# Patient Record
Sex: Male | Born: 1970
Health system: Southern US, Community
[De-identification: ages and names within clinical notes are randomized; demographics above are authoritative.]

## PROBLEM LIST (undated history)

## (undated) DIAGNOSIS — S91309A Unspecified open wound, unspecified foot, initial encounter: Secondary | ICD-10-CM

## (undated) DIAGNOSIS — E119 Type 2 diabetes mellitus without complications: Secondary | ICD-10-CM

## (undated) DIAGNOSIS — Z973 Presence of spectacles and contact lenses: Secondary | ICD-10-CM

## (undated) DIAGNOSIS — I4891 Unspecified atrial fibrillation: Secondary | ICD-10-CM

## (undated) DIAGNOSIS — E785 Hyperlipidemia, unspecified: Secondary | ICD-10-CM

## (undated) DIAGNOSIS — K219 Gastro-esophageal reflux disease without esophagitis: Secondary | ICD-10-CM

## (undated) DIAGNOSIS — I10 Essential (primary) hypertension: Secondary | ICD-10-CM

## (undated) HISTORY — DX: Hyperlipidemia, unspecified: E78.5

## (undated) HISTORY — DX: Essential (primary) hypertension: I10

## (undated) MED FILL — Medication: Fill #1 | Status: CN

---

## 2004-10-09 ENCOUNTER — Emergency Department (HOSPITAL_COMMUNITY): Admission: EM | Admit: 2004-10-09 | Discharge: 2004-10-09 | Payer: Self-pay | Admitting: Emergency Medicine

## 2006-03-28 ENCOUNTER — Emergency Department (HOSPITAL_COMMUNITY): Admission: EM | Admit: 2006-03-28 | Discharge: 2006-03-29 | Payer: Self-pay | Admitting: Emergency Medicine

## 2006-08-04 ENCOUNTER — Inpatient Hospital Stay (HOSPITAL_COMMUNITY): Admission: EM | Admit: 2006-08-04 | Discharge: 2006-08-07 | Payer: Self-pay | Admitting: Emergency Medicine

## 2012-11-07 ENCOUNTER — Emergency Department (HOSPITAL_COMMUNITY)
Admission: EM | Admit: 2012-11-07 | Discharge: 2012-11-07 | Disposition: A | Discharge status: Home / Self Care | Payer: Self-pay | Attending: Emergency Medicine | Admitting: Emergency Medicine

## 2012-11-07 ENCOUNTER — Encounter (HOSPITAL_COMMUNITY): Payer: Self-pay | Admitting: Nurse Practitioner

## 2012-11-07 DIAGNOSIS — R1032 Left lower quadrant pain: Secondary | ICD-10-CM | POA: Insufficient documentation

## 2012-11-07 DIAGNOSIS — E119 Type 2 diabetes mellitus without complications: Secondary | ICD-10-CM | POA: Insufficient documentation

## 2012-11-07 DIAGNOSIS — R109 Unspecified abdominal pain: Secondary | ICD-10-CM

## 2012-11-07 HISTORY — DX: Type 2 diabetes mellitus without complications: E11.9

## 2012-11-07 LAB — CBC WITH DIFFERENTIAL/PLATELET
Basophils Relative: 0 % (ref 0–1)
Eosinophils Absolute: 0 10*3/uL (ref 0.0–0.7)
Hemoglobin: 14 g/dL (ref 13.0–17.0)
MCH: 30.8 pg (ref 26.0–34.0)
MCHC: 36.3 g/dL — ABNORMAL HIGH (ref 30.0–36.0)
Neutrophils Relative %: 46 % (ref 43–77)
Platelets: 226 10*3/uL (ref 150–400)
RDW: 12.7 % (ref 11.5–15.5)

## 2012-11-07 LAB — COMPREHENSIVE METABOLIC PANEL
ALT: 12 U/L (ref 0–53)
BUN: 9 mg/dL (ref 6–23)
GFR calc non Af Amer: >90 mL/min (ref >90)
Glucose, Bld: 244 mg/dL — ABNORMAL HIGH (ref 70–99)
Potassium: 3.8 mEq/L (ref 3.5–5.1)
Sodium: 134 mEq/L — ABNORMAL LOW (ref 135–145)
Total Bilirubin: 0.3 mg/dL (ref 0.3–1.2)
Total Protein: 7.3 g/dL (ref 6.0–8.3)

## 2012-11-07 LAB — LIPASE, BLOOD: Lipase: 19 U/L (ref 11–59)

## 2012-11-07 MED ORDER — OXYCODONE-ACETAMINOPHEN 5-325 MG PO TABS
1.0000 | ORAL_TABLET | Freq: Once | ORAL | Status: AC
  Administered 2012-11-07: 1 via ORAL
  Filled 2012-11-07: qty 1

## 2012-11-07 NOTE — ED Notes (Signed)
Pt states he ate chinese food last night and drank "a couple beers" and since has developed upper abdominal pain. Pt states pain goes across entire upper abd, has been decreasing since last night. Pt states "I just want to get it checked out to make sure everything is ok."

## 2012-11-07 NOTE — ED Provider Notes (Signed)
History     CSN: [id number]  Arrival date & time 11/07/12  1218   First MD Initiated Contact with Patient 11/07/12 1337      Chief Complaint  Patient presents with  . Abdominal Pain     The history is provided by the patient.   patient reports developing upper abdominal pain last night that started gradually and has been persistent since then.  He now reports the upper abdominal pain seems to be somewhat improving.  This all began when he was eating [place] food and had several alcoholic drinks.  He is not very alcohol daily.  He has never had pancreatitis.  No history of gallstones.  He reports because of upper abdominal discomfort was still persistent this morning albeit improved he decided to come the emergency department for evaluation.  No fevers or chills.  No hematemesis.  No diarrhea.  No other complaints.  Past Medical History  Diagnosis Date  . Diabetes mellitus without complication     No past surgical history on file.  No family history on file.  History  Substance Use Topics  . Smoking status: Never Smoker   . Smokeless tobacco: Not on file  . Alcohol Use: No      Review of Systems  Gastrointestinal: Positive for abdominal pain.  All other systems reviewed and are negative.    Allergies  Review of patient's allergies indicates no known allergies.  Home Medications  No current outpatient prescriptions on file.  BP 128/86  Pulse 105  Temp(Src) 97.9 F (36.6 C) (Oral)  Resp 16  SpO2 100%  Physical Exam  Nursing note and vitals reviewed. Constitutional: He is oriented to person, place, and time. He appears well-developed and well-nourished.  HENT:  Head: Normocephalic and atraumatic.  Eyes: EOM are normal.  Neck: Normal range of motion.  Cardiovascular: Normal rate, regular rhythm, normal heart sounds and intact distal pulses.   Pulmonary/Chest: Effort normal and breath sounds normal. No respiratory distress.  Abdominal: Soft. He exhibits no  distension.  Mild epigastric tenderness without guarding or rebound  Musculoskeletal: Normal range of motion.  Neurological: He is alert and oriented to person, place, and time.  Skin: Skin is warm and dry.  Psychiatric: He has a normal mood and affect. Judgment normal.    ED Course  Procedures (including critical care time)  Labs Reviewed  CBC WITH DIFFERENTIAL - Abnormal; Notable for the following:    WBC 3.9 (*)    HCT 38.6 (*)    MCHC 36.3 (*)    All other components within normal limits  COMPREHENSIVE METABOLIC PANEL - Abnormal; Notable for the following:    Sodium 134 (*)    Chloride 95 (*)    Glucose, Bld 244 (*)    All other components within normal limits  LIPASE, BLOOD   No results found.   1. Abdominal pain       MDM  May represent gastritis versus pancreatitis versus alcohol induced gastritis.  Symptomatic control.  Lipase pending.        Lyanne Co, MD 11/07/12 1500

## 2012-11-07 NOTE — ED Notes (Signed)
C/o "shooting pains" in [place] since eating chinese food last night. Reports pain has decreased since onset but he is worried because the pain will not completely go away. Reports loose stool x 1 since last night

## 2014-08-11 HISTORY — PX: FINGER SURGERY: SHX640

## 2014-10-09 ENCOUNTER — Encounter (HOSPITAL_COMMUNITY): Payer: Self-pay | Admitting: Emergency Medicine

## 2014-10-09 ENCOUNTER — Emergency Department (HOSPITAL_COMMUNITY)
Admission: EM | Admit: 2014-10-09 | Discharge: 2014-10-09 | Disposition: A | Discharge status: Home / Self Care | Payer: Self-pay | Attending: Emergency Medicine | Admitting: Emergency Medicine

## 2014-10-09 DIAGNOSIS — K0381 Cracked tooth: Secondary | ICD-10-CM | POA: Insufficient documentation

## 2014-10-09 DIAGNOSIS — H9209 Otalgia, unspecified ear: Secondary | ICD-10-CM | POA: Insufficient documentation

## 2014-10-09 DIAGNOSIS — R51 Headache: Secondary | ICD-10-CM | POA: Insufficient documentation

## 2014-10-09 DIAGNOSIS — R000 Tachycardia, unspecified: Secondary | ICD-10-CM | POA: Insufficient documentation

## 2014-10-09 DIAGNOSIS — E1165 Type 2 diabetes mellitus with hyperglycemia: Secondary | ICD-10-CM | POA: Insufficient documentation

## 2014-10-09 DIAGNOSIS — K008 Other disorders of tooth development: Secondary | ICD-10-CM | POA: Insufficient documentation

## 2014-10-09 DIAGNOSIS — K029 Dental caries, unspecified: Secondary | ICD-10-CM | POA: Insufficient documentation

## 2014-10-09 DIAGNOSIS — R739 Hyperglycemia, unspecified: Secondary | ICD-10-CM

## 2014-10-09 LAB — BASIC METABOLIC PANEL
ANION GAP: 12 (ref 5–15)
BUN: 7 mg/dL (ref 6–23)
CALCIUM: 9.3 mg/dL (ref 8.4–10.5)
CHLORIDE: 100 mmol/L (ref 96–112)
CO2: 24 mmol/L (ref 19–32)
CREATININE: 0.76 mg/dL (ref 0.50–1.35)
GFR calc non Af Amer: >90 mL/min (ref >90)
Glucose, Bld: 259 mg/dL — ABNORMAL HIGH (ref 70–99)
Potassium: 4 mmol/L (ref 3.5–5.1)
Sodium: 136 mmol/L (ref 135–145)

## 2014-10-09 LAB — CBC
HCT: 39.8 % (ref 39.0–52.0)
HEMOGLOBIN: 13.6 g/dL (ref 13.0–17.0)
MCH: 29.6 pg (ref 26.0–34.0)
MCHC: 34.2 g/dL (ref 30.0–36.0)
MCV: 86.7 fL (ref 78.0–100.0)
PLATELETS: 259 10*3/uL (ref 150–400)
RBC: 4.59 MIL/uL (ref 4.22–5.81)
RDW: 13.3 % (ref 11.5–15.5)
WBC: 6.5 10*3/uL (ref 4.0–10.5)

## 2014-10-09 LAB — CBG MONITORING, ED: Glucose-Capillary: 266 mg/dL — ABNORMAL HIGH (ref 70–99)

## 2014-10-09 MED ORDER — PENICILLIN V POTASSIUM 500 MG PO TABS: 500.0000 mg | ORAL_TABLET | Freq: Three times a day (TID) | ORAL | Status: DC

## 2014-10-09 MED ORDER — ACETAMINOPHEN 325 MG PO TABS: 325.0000 mg | ORAL_TABLET | Freq: Once | ORAL | Status: DC

## 2014-10-09 MED ORDER — IBUPROFEN 800 MG PO TABS
800.0000 mg | ORAL_TABLET | Freq: Once | ORAL | Status: AC
  Administered 2014-10-09: 800 mg via ORAL
  Filled 2014-10-09: qty 1

## 2014-10-09 MED ORDER — PENICILLIN V POTASSIUM 250 MG PO TABS
500.0000 mg | ORAL_TABLET | Freq: Once | ORAL | Status: AC
  Administered 2014-10-09: 500 mg via ORAL
  Filled 2014-10-09: qty 2

## 2014-10-09 MED ORDER — HYDROCODONE-ACETAMINOPHEN 5-325 MG PO TABS: 1.0000 | ORAL_TABLET | Freq: Four times a day (QID) | ORAL | Status: DC | PRN

## 2014-10-09 MED ORDER — ACETAMINOPHEN 325 MG PO TABS
650.0000 mg | ORAL_TABLET | Freq: Once | ORAL | Status: AC
  Administered 2014-10-09: 650 mg via ORAL

## 2014-10-09 MED ORDER — ACETAMINOPHEN 325 MG PO TABS
ORAL_TABLET | ORAL | Status: AC
  Filled 2014-10-09: qty 2

## 2014-10-09 MED ORDER — METFORMIN HCL 500 MG PO TABS
500.0000 mg | ORAL_TABLET | Freq: Once | ORAL | Status: AC
  Administered 2014-10-09: 500 mg via ORAL
  Filled 2014-10-09: qty 1

## 2014-10-09 NOTE — ED Provider Notes (Signed)
CSN: [id number]     Arrival date & time 10/09/14  1753 History   First MD Initiated Contact with Patient 10/09/14 2009     Chief Complaint  Patient presents with  . Dental Pain     (Consider location/radiation/quality/duration/timing/severity/associated sxs/prior Treatment) The history is provided by the patient and medical records. No language interpreter was used.     Taylor Hughes is a 44 y.o. male  with a hx of NIDDM presents to the Emergency Department complaining of gradual, persistent, progressively worsening left dental pain onset yesterday.  Pt reports he is supposed to see the dentist on Friday for extraction as the tooth has been broken for some time.  He denies fevers at home, but c/o mild subjective facial swelling.  He also reports associated mild, generalized throbbing headache. No treatment prior to arrival. Eating, cold and hot foods makes his pain worse.  Pt reports he ate sweets today that caused his CBG to be high.  He reports it normally runs between 120-150.  Pt is taking metformin.  Denies polyuria and polydipsia.  Pt denies fevers, chills, neck pain, chest pain, SOB, abd pain, N/V/D, weakness, dizziness, syncope.    Past Medical History  Diagnosis Date  . Diabetes mellitus without complication    History reviewed. No pertinent past surgical history. No family history on file. History  Substance Use Topics  . Smoking status: Never Smoker   . Smokeless tobacco: Not on file  . Alcohol Use: No    Review of Systems  Constitutional: Negative for fever, chills and appetite change.  HENT: Positive for dental problem and ear pain. Negative for drooling, facial swelling, nosebleeds, postnasal drip, rhinorrhea and trouble swallowing.   Eyes: Negative for pain and redness.  Respiratory: Negative for cough and wheezing.   Cardiovascular: Negative for chest pain.  Gastrointestinal: Negative for nausea, vomiting and abdominal pain.  Musculoskeletal: Negative for neck pain  and neck stiffness.  Skin: Negative for color change and rash.  Neurological: Positive for headaches. Negative for weakness and light-headedness.  All other systems reviewed and are negative.     Allergies  Review of patient's allergies indicates no known allergies.  Home Medications   Prior to Admission medications   Medication Sig Start Date End Date Taking? Authorizing Provider  HYDROcodone-acetaminophen (NORCO/VICODIN) 5-325 MG per tablet Take 1-2 tablets by mouth every 6 (six) hours as needed for moderate pain or severe pain. 10/09/14   Amylia Collazos, PA-C  penicillin v potassium (VEETID) 500 MG tablet Take 1 tablet (500 mg total) by mouth 3 (three) times daily. 10/09/14   Michol Emory, PA-C   BP 118/79 mmHg  Pulse 109  Temp(Src) 100 F (37.8 C) (Oral)  Resp 20  Ht 5\' 11"  (1.803 m)  Wt 200 lb (90.719 kg)  BMI 27.91 kg/m2  SpO2 98% Physical Exam  Constitutional: He appears well-developed and well-nourished.  HENT:  Head: Normocephalic.  Right Ear: Tympanic membrane, external ear and ear canal normal.  Left Ear: Tympanic membrane, external ear and ear canal normal.  Nose: Nose normal. Right sinus exhibits no maxillary sinus tenderness and no frontal sinus tenderness. Left sinus exhibits no maxillary sinus tenderness and no frontal sinus tenderness.  Mouth/Throat: Uvula is midline, oropharynx is clear and moist and mucous membranes are normal. No oral lesions. Abnormal dentition. Dental caries present. No uvula swelling or lacerations. No oropharyngeal exudate, posterior oropharyngeal edema, posterior oropharyngeal erythema or tonsillar abscesses.  No gingival swelling, fluctuance or induration No gross abscess Tooth #13  is broken off at the gumline  Eyes: Conjunctivae are normal. Pupils are equal, round, and reactive to light. Right eye exhibits no discharge. Left eye exhibits no discharge.  Neck: Normal range of motion. Neck supple.  No stridor Handling  secretions without difficulty No nuchal rigidity No cervical lymphadenopathy   Cardiovascular: Regular rhythm, S1 normal, S2 normal, normal heart sounds and intact distal pulses.  Tachycardia present.   Pulses:      Radial pulses are 2+ on the right side, and 2+ on the left side.  Pulmonary/Chest: Effort normal. No respiratory distress.  Equal chest rise  Abdominal: Soft. Bowel sounds are normal. He exhibits no distension. There is no tenderness.  abd soft and nontender  Lymphadenopathy:    He has no cervical adenopathy.  Neurological: He is alert.  Skin: Skin is warm and dry. No erythema.  Psychiatric: He has a normal mood and affect.  Nursing note and vitals reviewed.   ED Course  Dental Date/Time: 10/09/2014 11:23 PM Performed by: Abigail Butts Authorized by: Abigail Butts Consent: Verbal consent obtained. Risks and benefits: risks, benefits and alternatives were discussed Consent given by: patient Patient understanding: patient states understanding of the procedure being performed Patient consent: the patient's understanding of the procedure matches consent given Procedure consent: procedure consent matches procedure scheduled Relevant documents: relevant documents present and verified Site marked: the operative site was marked Required items: required blood products, implants, devices, and special equipment available Patient identity confirmed: verbally with patient and arm band Time out: Immediately prior to procedure a "time out" was called to verify the correct patient, procedure, equipment, support staff and site/side marked as required. Preparation: Patient was prepped and draped in the usual sterile fashion. Local anesthesia used: yes Local anesthetic: bupivacaine 0.5% with epinephrine Anesthetic total: 1.5 ml Patient sedated: no Patient tolerance: Patient tolerated the procedure well with no immediate complications Comments: Dental block of tooth #13  with complete pain relief   (including critical care time) Labs Review Labs Reviewed  BASIC METABOLIC PANEL - Abnormal; Notable for the following:    Glucose, Bld 259 (*)    All other components within normal limits  CBG MONITORING, ED - Abnormal; Notable for the following:    Glucose-Capillary 266 (*)    All other components within normal limits  CBC    Imaging Review No results found.   EKG Interpretation None      MDM   Final diagnoses:  Pain due to dental caries  Hyperglycemia without ketosis   [name] presents with c/o dental pain.  No gross abscess; no facial swelling.  Exam unconcerning for Ludwig's angina or spread of infection.  Patient with history of non-insulin-dependent diabetes, taking his metformin as prescribed. Mild hyperglycemia without anion gap or evidence of DKA.   Patient with low-grade fever and tachycardia here in the emergency department. He reports he feels well and denies concerning systemic symptoms of infection; including nuchal rigidity, rash, N/V.  Will treat with penicillin and pain medicine.  Urged patient to follow-up with dentist.     I have personally reviewed patient's vitals, nursing note and any pertinent labs or imaging.  I performed an focused physical exam; undressed when appropriate .    It has been determined that no acute conditions requiring further emergency intervention are present at this time. The patient/guardian have been advised of the diagnosis and plan. I reviewed any labs and imaging including any potential incidental findings. We have discussed signs and symptoms that warrant  return to the ED and they are listed in the discharge instructions.    Vital signs are stable at discharge.   Pt's tachycardia and fever are improving.  He reports feeling well.  BP 118/79 mmHg  Pulse 109  Temp(Src) 100 F (37.8 C) (Oral)  Resp 20  Ht 5\' 11"  (1.803 m)  Wt 200 lb (90.719 kg)  BMI 27.91 kg/m2  SpO2 98%        Abigail Butts, PA-C 10/09/14 [place], MD 10/09/14 2328

## 2014-10-09 NOTE — Discharge Instructions (Signed)
1. Medications: vicodin, penicillin, usual home medications 2. Treatment: rest, drink plenty of fluids, take medications as prescribed 3. Follow Up: Please followup with dentistry within 1 week for discussion of your diagnoses and further evaluation after today's visit; if you do not have a primary care doctor use the resource guide provided to find one; Return to the ER for high fevers, difficulty breathing, difficulty swallowing or other concerning symptoms    Dental Caries Dental caries is tooth decay. This decay can cause a hole in teeth (cavity) that can get bigger and deeper over time. HOME CARE  Brush and floss your teeth. Do this at least two times a day.  Use a fluoride toothpaste.  Use a mouth rinse if told by your dentist or doctor.  Eat less sugary and starchy foods. Drink less sugary drinks.  Avoid snacking often on sugary and starchy foods. Avoid sipping often on sugary drinks.  Keep regular checkups and cleanings with your dentist.  Use fluoride supplements if told by your dentist or doctor.  Allow fluoride to be applied to teeth if told by your dentist or doctor. Document Released: 05/06/2008 Document Revised: 12/12/2013 Document Reviewed: 07/30/2012 [facility] Patient Information 2015 [place], [place]. This information is not intended to replace advice given to you by your health care provider. Make sure you discuss any questions you have with your health care provider.

## 2014-10-09 NOTE — ED Notes (Signed)
Pt c/o upper L tooth abscess. Pt is supposed to have tooth pulled on Friday but sts pain is too bad.

## 2015-08-03 ENCOUNTER — Encounter: Payer: Self-pay | Admitting: Physician Assistant

## 2015-08-03 ENCOUNTER — Ambulatory Visit (INDEPENDENT_AMBULATORY_CARE_PROVIDER_SITE_OTHER): Payer: Self-pay | Admitting: Physician Assistant

## 2015-08-03 VITALS — BP 122/84 | HR 100 | Temp 97.8°F | Resp 16 | Ht 70.0 in | Wt 175.0 lb

## 2015-08-03 DIAGNOSIS — Z299 Encounter for prophylactic measures, unspecified: Secondary | ICD-10-CM

## 2015-08-03 DIAGNOSIS — Z23 Encounter for immunization: Secondary | ICD-10-CM

## 2015-08-03 DIAGNOSIS — L03011 Cellulitis of right finger: Secondary | ICD-10-CM

## 2015-08-03 MED ORDER — CEFTRIAXONE SODIUM 1 G IJ SOLR
1.0000 g | Freq: Once | INTRAMUSCULAR | Status: AC
  Administered 2015-08-03: 1 g via INTRAMUSCULAR

## 2015-08-03 MED ORDER — DOXYCYCLINE HYCLATE 100 MG PO CAPS: 100.0000 mg | ORAL_CAPSULE | Freq: Two times a day (BID) | ORAL | Status: AC

## 2015-08-03 NOTE — Patient Instructions (Signed)
Take 400-600 mg of Ibuprofen every 8 hours for pain. I would like to see you back on 12/26 anytime from 8-12 in the morning to see how your finger is doing.  If you have problems on Christmas day the please go to the ED.

## 2015-08-03 NOTE — Progress Notes (Signed)
08/03/2015 5:38 PM   DOB: 1971-03-17 / MRN: [id number]  SUBJECTIVE:  Taylor Hughes is a 44 y.o. male with a history of uncolngdiabetes mellitus presenting for an infected right 5 digit.  Report that he had a splinter in the finger last week and 5 days ago the finger become severely tender after his girlfriend tried to dig the splinter out.  Denies a loss of function and decreased sensation of the finger.    He has No Known Allergies.   He  has a past medical history of Diabetes mellitus without complication ([place]).    He  reports that he has never smoked. He has never used smokeless tobacco. He reports that he does not drink alcohol or use illicit drugs. He  has no sexual activity history on file. The patient  has no past surgical history on file.  His family history includes Diabetes in his mother.  Review of Systems  Constitutional: Negative for fever and chills.  Eyes: Negative for blurred vision.  Respiratory: Negative for cough and shortness of breath.   Cardiovascular: Negative for chest pain.  Gastrointestinal: Negative for nausea and abdominal pain.  Genitourinary: Negative for dysuria, urgency and frequency.  Musculoskeletal: Positive for joint pain. Negative for myalgias.  Skin: Negative for rash.  Neurological: Negative for dizziness, tingling and headaches.  Psychiatric/Behavioral: Negative for depression. The patient is not nervous/anxious.    No results found for: HGBA1C  Problem list and medications reviewed and updated by myself where necessary, and exist elsewhere in the encounter.   OBJECTIVE:  BP 122/84 mmHg  Pulse 100  Temp(Src) 97.8 F (36.6 C) (Oral)  Resp 16  Ht 5\' 10"  (1.778 m)  Wt 175 lb (79.379 kg)  BMI 25.11 kg/m2  SpO2 98%  Physical Exam  Constitutional: He is oriented to person, place, and time. He appears well-developed. He does not appear ill.  Eyes: Conjunctivae and EOM are normal. Pupils are equal, round, and reactive to light.    Cardiovascular: Normal rate.   Pulmonary/Chest: Effort normal.  Abdominal: He exhibits no distension.  Musculoskeletal: Normal range of motion.  Neurological: He is alert and oriented to person, place, and time. No cranial nerve deficit. Coordination normal.  Skin: Skin is warm and dry. He is not diaphoretic. There is erythema.  Psychiatric: He has a normal mood and affect.  Nursing note and vitals reviewed.   No results found for this or any previous visit (from the past 48 hour(s)).  Risk and benefits discussed and verbal consent obtained. Anesthetic allergies reviewed. Patient anesthetized using 1:1 mix of 2% lidocaine without epi. A 1 cm incision was made using a number 11 blade and purulent material was expressed.  The was not wound packed. The patient tolerated the procedure without difficulty.   A clean dressing was placed and wound care instructions were provided.    ASSESSMENT AND PLAN  Taylor Hughes was seen today for hand injury.  Diagnoses and all orders for this visit:  Cellulitis of finger of right hand: Drained his finger.  Will see him back tomorrow for a recheck.   -     cefTRIAXone (ROCEPHIN) injection 1 g; Inject 1 g into the muscle once. -     doxycycline (VIBRAMYCIN) 100 MG capsule; Take 1 capsule (100 mg total) by mouth 2 (two) times daily.  Need for prophylactic measure -     Tdap vaccine greater than or equal to 7yo IM    The patient was advised to call or return  to clinic if he does not see an improvement in symptoms or to seek the care of the closest emergency department if he worsens with the above plan.   Philis Fendt, MHS, PA-C [facility] 08/03/2015 5:38 PM

## 2015-08-06 ENCOUNTER — Other Ambulatory Visit: Payer: Self-pay | Admitting: Physician Assistant

## 2015-08-06 MED ORDER — CEPHALEXIN 500 MG PO CAPS: 1000.0000 mg | ORAL_CAPSULE | Freq: Two times a day (BID) | ORAL | Status: DC

## 2015-08-06 NOTE — Progress Notes (Signed)
Adding Keflex to paitnet's regimen given culture results.  He is an uncontrolled diabetic and did not return for follow up as advised. Given this will continue his doxy to ensure coverage for staff.  Philis Fendt, MS, PA-C 8:35 PM, 08/06/2015  Recent Results (from the past 2160 hour(s))  Wound culture     Status: None (Preliminary result)   Collection Time: 08/03/15  9:36 AM  Result Value Ref Range   Gram Stain Few    Gram Stain WBC present-predominately PMN    Gram Stain No Squamous Epithelial Cells Seen    Gram Stain Abundant Gram Positive Cocci In Pairs In Clusters    Preliminary Report Moderate GROUP B STREP (S.AGALACTIAE) ISOLATED     Comment: Beta hemolytic streptococci are predictably susceptible to penicillin and other beta-lactams. Susceptibility testing not routinely performed.

## 2015-08-07 LAB — WOUND CULTURE: Gram Stain: NONE SEEN

## 2015-08-07 NOTE — Progress Notes (Signed)
Left message for pt to call back  °

## 2015-08-08 NOTE — Progress Notes (Signed)
Left message for pt to call back  °

## 2015-08-10 ENCOUNTER — Telehealth: Payer: Self-pay | Admitting: Physician Assistant

## 2015-08-10 NOTE — Telephone Encounter (Signed)
Patient request for Taylor Hughes to give him a call. Patient didn't give me a reason. Please call patient at [phone].

## 2015-08-10 NOTE — Telephone Encounter (Signed)
Left message for pt to call back.  See previous message.  Tereasa Coop, PA-C at 08/06/2015 8:34 PM     Status: Signed       Expand All Collapse All   Adding Keflex to paitnet's regimen given culture results. He is an uncontrolled diabetic and did not return for follow up as advised. Given this will continue his doxy to ensure coverage for staff. Philis Fendt, MS, PA-C 8:35 PM, 08/06/2015

## 2016-03-23 ENCOUNTER — Encounter (HOSPITAL_COMMUNITY): Payer: Self-pay | Admitting: Emergency Medicine

## 2016-03-23 ENCOUNTER — Emergency Department (HOSPITAL_COMMUNITY)
Admission: EM | Admit: 2016-03-23 | Discharge: 2016-03-23 | Disposition: A | Discharge status: Home / Self Care | Payer: PRIVATE HEALTH INSURANCE | Attending: Emergency Medicine | Admitting: Emergency Medicine

## 2016-03-23 DIAGNOSIS — E1165 Type 2 diabetes mellitus with hyperglycemia: Secondary | ICD-10-CM | POA: Diagnosis present

## 2016-03-23 DIAGNOSIS — Z791 Long term (current) use of non-steroidal anti-inflammatories (NSAID): Secondary | ICD-10-CM | POA: Diagnosis not present

## 2016-03-23 DIAGNOSIS — R739 Hyperglycemia, unspecified: Secondary | ICD-10-CM

## 2016-03-23 DIAGNOSIS — R358 Other polyuria: Secondary | ICD-10-CM | POA: Diagnosis not present

## 2016-03-23 LAB — URINALYSIS, ROUTINE W REFLEX MICROSCOPIC
Glucose, UA: >1000 mg/dL — AB
Hgb urine dipstick: NEGATIVE
Ketones, ur: 40 mg/dL — AB
Leukocytes, UA: NEGATIVE
Nitrite: NEGATIVE
Protein, ur: NEGATIVE mg/dL
Specific Gravity, Urine: 1.042 — ABNORMAL HIGH (ref 1.005–1.030)
pH: 5.5 (ref 5.0–8.0)

## 2016-03-23 LAB — URINE MICROSCOPIC-ADD ON

## 2016-03-23 LAB — CBC WITH DIFFERENTIAL/PLATELET
Basophils Absolute: 0 K/uL (ref 0.0–0.1)
Basophils Relative: 0 %
Eosinophils Absolute: 0 K/uL (ref 0.0–0.7)
Eosinophils Relative: 0 %
HCT: 37 % — ABNORMAL LOW (ref 39.0–52.0)
Hemoglobin: 13.1 g/dL (ref 13.0–17.0)
Lymphocytes Relative: 42 %
Lymphs Abs: 1.6 K/uL (ref 0.7–4.0)
MCH: 30.8 pg (ref 26.0–34.0)
MCHC: 35.4 g/dL (ref 30.0–36.0)
MCV: 86.9 fL (ref 78.0–100.0)
Monocytes Absolute: 0.4 K/uL (ref 0.1–1.0)
Monocytes Relative: 10 %
Neutro Abs: 1.8 K/uL (ref 1.7–7.7)
Neutrophils Relative %: 48 %
Platelets: 275 K/uL (ref 150–400)
RBC: 4.26 MIL/uL (ref 4.22–5.81)
RDW: 13 % (ref 11.5–15.5)
WBC: 3.8 K/uL — ABNORMAL LOW (ref 4.0–10.5)

## 2016-03-23 LAB — COMPREHENSIVE METABOLIC PANEL WITH GFR
ALT: 22 U/L (ref 17–63)
AST: 27 U/L (ref 15–41)
Albumin: 4 g/dL (ref 3.5–5.0)
Alkaline Phosphatase: 53 U/L (ref 38–126)
Anion gap: 13 (ref 5–15)
BUN: 12 mg/dL (ref 6–20)
CO2: 22 mmol/L (ref 22–32)
Calcium: 9.3 mg/dL (ref 8.9–10.3)
Chloride: 98 mmol/L — ABNORMAL LOW (ref 101–111)
Creatinine, Ser: 0.71 mg/dL (ref 0.61–1.24)
GFR calc Af Amer: >60 mL/min
GFR calc non Af Amer: >60 mL/min
Glucose, Bld: 331 mg/dL — ABNORMAL HIGH (ref 65–99)
Potassium: 4 mmol/L (ref 3.5–5.1)
Sodium: 133 mmol/L — ABNORMAL LOW (ref 135–145)
Total Bilirubin: 0.8 mg/dL (ref 0.3–1.2)
Total Protein: 7.6 g/dL (ref 6.5–8.1)

## 2016-03-23 LAB — CBG MONITORING, ED
Glucose-Capillary: 324 mg/dL — ABNORMAL HIGH (ref 65–99)
Glucose-Capillary: 208 mg/dL — ABNORMAL HIGH (ref 65–99)

## 2016-03-23 MED ORDER — METFORMIN HCL 500 MG PO TABS: 500.0000 mg | ORAL_TABLET | Freq: Two times a day (BID) | ORAL | 0 refills | Status: DC

## 2016-03-23 MED ORDER — SODIUM CHLORIDE 0.9 % IV BOLUS (SEPSIS)
1000.0000 mL | Freq: Once | INTRAVENOUS | Status: AC
  Administered 2016-03-23: 1000 mL via INTRAVENOUS

## 2016-03-23 NOTE — Discharge Instructions (Signed)
Take metformin as prescribed. Encourage low carb diet and exercise. Follow up with primary care provider for re-evaluation and medication management. Return to the ED if you experience severe worsening of your symptoms, chest pain, difficulty breathing, numbness or tingling in your extremities.

## 2016-03-23 NOTE — ED Provider Notes (Signed)
[facility] DEPT Provider Note   CSN: [id number] Arrival date & time: 03/23/16  1040  First Provider Contact:  First MD Initiated Contact with Patient 03/23/16 1118        History   Chief Complaint Chief Complaint  Patient presents with  . Hyperglycemia    HPI Taylor Hughes is a 45 y.o. male with a past medical history of type 2 diabetes who presents the ED today complaining of "jitteriness". Patient states that he previously took metformin for his diabetes but has not taken his medications since 2012. He states that today he was at work, cooking in the kitchen when he felt sudden onset jitteriness throughout his body. Patient states he felt like his hands and body were trembling. Patient states he called his wife who told him that his sugar was probably high so he came to the ED for further evaluation. He denies any chest pain, diaphoresis, nausea, dizziness. Of note, patient reports that he has had a 100 pound weight loss since 2012 that was unintentional. He does state that he has been walking more but feels that he has lost all his weight secondary to uncontrolled diabetes. He denies any recreational drug use. No night sweats, chills or pain.    HPI  Past Medical History:  Diagnosis Date  . Diabetes mellitus without complication ([place])     There are no active problems to display for this patient.   History reviewed. No pertinent surgical history.     Home Medications    Prior to Admission medications   Medication Sig Start Date End Date Taking? Authorizing Provider  Ibuprofen-Diphenhydramine Cit (IBUPROFEN PM) 200-38 MG TABS Take 2 tablets by mouth at bedtime as needed (PAIN, SLEEP).   Yes Historical Provider, MD    Family History Family History  Problem Relation Age of Onset  . Diabetes Mother     Social History Social History  Substance Use Topics  . Smoking status: Never Smoker  . Smokeless tobacco: Never Used  . Alcohol use No     Allergies     Review of patient's allergies indicates no known allergies.   Review of Systems Review of Systems  All other systems reviewed and are negative.    Physical Exam Updated Vital Signs BP 115/92   Pulse (!) 124   Temp 98.8 F (37.1 C) (Oral)   Resp 16   SpO2 100%   Physical Exam  Constitutional: He is oriented to person, place, and time. He appears well-developed and well-nourished. No distress.  HENT:  Head: Normocephalic and atraumatic.  Mouth/Throat: No oropharyngeal exudate.  Eyes: Conjunctivae and EOM are normal. Pupils are equal, round, and reactive to light. Right eye exhibits no discharge. Left eye exhibits no discharge. No scleral icterus.  Cardiovascular: Normal rate, regular rhythm, normal heart sounds and intact distal pulses.  Exam reveals no gallop and no friction rub.   No murmur heard. Pulmonary/Chest: Effort normal and breath sounds normal. No respiratory distress. He has no wheezes. He has no rales. He exhibits no tenderness.  Abdominal: Soft. He exhibits no distension. There is no tenderness. There is no guarding.  Musculoskeletal: Normal range of motion. He exhibits no edema.  Neurological: He is alert and oriented to person, place, and time. No cranial nerve deficit. He exhibits normal muscle tone. Coordination normal.  Strength 5/5 throughout. No sensory deficits. No gait abnormality. No dysmetria. No slurred speech. No facial droop. Negative pronator drift.    Skin: Skin is warm and dry. No rash  noted. He is not diaphoretic. No erythema. No pallor.  Psychiatric: He has a normal mood and affect. His behavior is normal.  Nursing note and vitals reviewed.    ED Treatments / Results  Labs (all labs ordered are listed, but only abnormal results are displayed) Labs Reviewed  COMPREHENSIVE METABOLIC PANEL - Abnormal; Notable for the following:       Result Value   Sodium 133 (*)    Chloride 98 (*)    Glucose, Bld 331 (*)    All other components within  normal limits  CBC WITH DIFFERENTIAL/PLATELET - Abnormal; Notable for the following:    WBC 3.8 (*)    HCT 37.0 (*)    All other components within normal limits  URINALYSIS, ROUTINE W REFLEX MICROSCOPIC (NOT AT [facility]) - Abnormal; Notable for the following:    Specific Gravity, Urine 1.042 (*)    Glucose, UA >1000 (*)    Bilirubin Urine SMALL (*)    Ketones, ur 40 (*)    All other components within normal limits  URINE MICROSCOPIC-ADD ON - Abnormal; Notable for the following:    Squamous Epithelial / LPF 0-5 (*)    Bacteria, UA RARE (*)    Casts GRANULAR CAST (*)    All other components within normal limits  CBG MONITORING, ED - Abnormal; Notable for the following:    Glucose-Capillary 324 (*)    All other components within normal limits    EKG  EKG Interpretation  Date/Time:  Sunday March 23 2016 11:25:19 EDT Ventricular Rate:  109 PR Interval:    QRS Duration: 87 QT Interval:  307 QTC Calculation: 414 R Axis:   77 Text Interpretation:  Sinus tachycardia Borderline T wave abnormalities No significant change since last tracing Confirmed by ALLEN  MD, ANTHONY ([id number]) on 03/25/2016 4:33:11 AM       Radiology No results found.  Procedures Procedures (including critical care time)  Medications Ordered in ED Medications  sodium chloride 0.9 % bolus 1,000 mL (1,000 mLs Intravenous New Bag/Given 03/23/16 1203)     Initial Impression / Assessment and Plan / ED Course  I have reviewed the triage vital signs and the nursing notes.  Pertinent labs & imaging results that were available during my care of the patient were reviewed by me and considered in my medical decision making (see chart for details).  Clinical Course   45 y.o M with a pmhx of uncontrolled type 2 DM presents to the ED today c/o "jitteriness" while at work today. On presentation to ED, pt appears well, in NAD. Initial HR 124. EKG is sinus tachycardia. Pt states that he has not taken his DM medications in  several years and he has felt this way before when his sugars were elevated. CBG is 324. NO sign of DKA. No anion gap. Minimal ketones in urine. Pt given 1L NS. Repeat CBG is 208, trending down. HR now 97. Pt reports significant symptomatic improvement. Symptoms likely related to hyperglycemia. Will d/c with prescription for metformin. Pts wife at bedside who states that she has scheduled him an appointment with a PCP for further eval. Discussed importance of medication compliance. Return precautions outlined in patient discharge instructions.   Case discussed with Dr. Laverta Baltimore who agrees with treatment plan.  Final Clinical Impressions(s) / ED Diagnoses   Final diagnoses:  Hyperglycemia    New Prescriptions Discharge Medication List as of 03/23/2016  2:28 PM    START taking these medications   Details  metFORMIN (  GLUCOPHAGE) 500 MG tablet Take 1 tablet (500 mg total) by mouth 2 (two) times daily with a meal., Starting Sun 03/23/2016, Print         Dondra Spry [place], PA-C 03/26/16 [place], MD 03/27/16 [phone]

## 2016-03-23 NOTE — ED Triage Notes (Addendum)
Pt reports he had to leave work this am due to feeling jittery. Pt has been out of his diabetes medication for some time. CBG 342 in triage. Has also had polyuria, polydipsia and polyphagia.

## 2016-03-23 NOTE — ED Notes (Signed)
EKG handed to Dr. Laverta Baltimore for review.

## 2016-03-25 LAB — URINE CULTURE: Culture: <10000 — AB

## 2017-01-27 ENCOUNTER — Ambulatory Visit: Payer: PRIVATE HEALTH INSURANCE | Admitting: *Deleted

## 2018-01-29 ENCOUNTER — Encounter (HOSPITAL_COMMUNITY): Payer: Self-pay | Admitting: Emergency Medicine

## 2018-01-29 ENCOUNTER — Emergency Department (HOSPITAL_COMMUNITY)
Admission: EM | Admit: 2018-01-29 | Discharge: 2018-01-29 | Disposition: A | Discharge status: Home / Self Care | Payer: BLUE CROSS/BLUE SHIELD | Attending: Emergency Medicine | Admitting: Emergency Medicine

## 2018-01-29 DIAGNOSIS — Z7984 Long term (current) use of oral hypoglycemic drugs: Secondary | ICD-10-CM | POA: Diagnosis not present

## 2018-01-29 DIAGNOSIS — E11649 Type 2 diabetes mellitus with hypoglycemia without coma: Secondary | ICD-10-CM | POA: Diagnosis not present

## 2018-01-29 DIAGNOSIS — Y939 Activity, unspecified: Secondary | ICD-10-CM | POA: Insufficient documentation

## 2018-01-29 DIAGNOSIS — S0081XA Abrasion of other part of head, initial encounter: Secondary | ICD-10-CM | POA: Diagnosis not present

## 2018-01-29 DIAGNOSIS — E162 Hypoglycemia, unspecified: Secondary | ICD-10-CM

## 2018-01-29 DIAGNOSIS — Y929 Unspecified place or not applicable: Secondary | ICD-10-CM | POA: Diagnosis not present

## 2018-01-29 DIAGNOSIS — X58XXXA Exposure to other specified factors, initial encounter: Secondary | ICD-10-CM | POA: Diagnosis not present

## 2018-01-29 DIAGNOSIS — Y999 Unspecified external cause status: Secondary | ICD-10-CM | POA: Insufficient documentation

## 2018-01-29 LAB — CBC WITH DIFFERENTIAL/PLATELET
Basophils Absolute: 0 10*3/uL (ref 0.0–0.1)
Basophils Relative: 0 %
EOS ABS: 0 10*3/uL (ref 0.0–0.7)
EOS PCT: 0 %
HCT: 38.9 % — ABNORMAL LOW (ref 39.0–52.0)
Hemoglobin: 13 g/dL (ref 13.0–17.0)
LYMPHS ABS: 1 10*3/uL (ref 0.7–4.0)
Lymphocytes Relative: 14 %
MCH: 29.7 pg (ref 26.0–34.0)
MCHC: 33.4 g/dL (ref 30.0–36.0)
MCV: 89 fL (ref 78.0–100.0)
MONO ABS: 0.4 10*3/uL (ref 0.1–1.0)
MONOS PCT: 6 %
Neutro Abs: 5.3 10*3/uL (ref 1.7–7.7)
Neutrophils Relative %: 80 %
PLATELETS: 236 10*3/uL (ref 150–400)
RBC: 4.37 MIL/uL (ref 4.22–5.81)
RDW: 14.5 % (ref 11.5–15.5)
WBC: 6.7 10*3/uL (ref 4.0–10.5)

## 2018-01-29 LAB — CBG MONITORING, ED
GLUCOSE-CAPILLARY: 285 mg/dL — AB (ref 65–99)
GLUCOSE-CAPILLARY: 247 mg/dL — AB (ref 65–99)
Glucose-Capillary: 256 mg/dL — ABNORMAL HIGH (ref 65–99)

## 2018-01-29 LAB — URINALYSIS, ROUTINE W REFLEX MICROSCOPIC
Bacteria, UA: NONE SEEN
Bilirubin Urine: NEGATIVE
Glucose, UA: >=500 mg/dL — AB
Ketones, ur: NEGATIVE mg/dL
Leukocytes, UA: NEGATIVE
Nitrite: NEGATIVE
Protein, ur: NEGATIVE mg/dL
Specific Gravity, Urine: 1.014 (ref 1.005–1.030)
pH: 6 (ref 5.0–8.0)

## 2018-01-29 LAB — BASIC METABOLIC PANEL
Anion gap: 11 (ref 5–15)
BUN: 14 mg/dL (ref 6–20)
CHLORIDE: 103 mmol/L (ref 101–111)
CO2: 26 mmol/L (ref 22–32)
CREATININE: 0.68 mg/dL (ref 0.61–1.24)
Calcium: 9.2 mg/dL (ref 8.9–10.3)
GFR calc Af Amer: >60 mL/min (ref >60)
Glucose, Bld: 217 mg/dL — ABNORMAL HIGH (ref 65–99)
Potassium: 3.2 mmol/L — ABNORMAL LOW (ref 3.5–5.1)
Sodium: 140 mmol/L (ref 135–145)

## 2018-01-29 MED ORDER — BACITRACIN ZINC 500 UNIT/GM EX OINT
1.0000 "application " | TOPICAL_OINTMENT | Freq: Two times a day (BID) | CUTANEOUS | Status: DC
  Administered 2018-01-29: 1 via TOPICAL
  Filled 2018-01-29: qty 0.9

## 2018-01-29 MED ORDER — ACETAMINOPHEN 325 MG PO TABS
650.0000 mg | ORAL_TABLET | Freq: Once | ORAL | Status: AC
  Administered 2018-01-29: 650 mg via ORAL
  Filled 2018-01-29: qty 2

## 2018-01-29 NOTE — Discharge Instructions (Signed)
Continue your home medications.  Follow your sliding scale for your insulin exactly.  Make sure to continue eating regular meals. Follow-up with your primary care doctor. Return to the ED for new or worsening symptoms.

## 2018-01-29 NOTE — ED Triage Notes (Signed)
Pt comes from home, unwitnessed seizure, and found to be hypoglycemia, ems found to have cbg 36. 20 Iv left AC, glucagon  Right shoulder administered amp of D 10 given by ems. V/s on 168/100, pulse 80 , cbg 153, rr 16, ekh sinus rhythm on monitor.   Small abrasion to right temple head.

## 2018-01-29 NOTE — ED Provider Notes (Signed)
[facility] DEPT Provider Note   CSN: [id number] Arrival date & time: 01/29/18  0209     History   Chief Complaint Chief Complaint  Patient presents with  . Hypoglycemia    HPI Taylor Hughes is a 47 y.o. male.   Hypoglycemia     47 y.o. M with hx of DM, presenting to the ED after episode of hypoglycemia.  Apparently started feeling bad this evening and suspected he had a seizure but this was unwitnessed.  States EMS was called, patient was found to be hypoglycemic to 36.  He was given glucagon and amp of D10.  CBG on arrival here 153.  Patient states he is feeling better at this time.  Does report he may have taken too much of his short acting insulin this evening.  No recent changes in his dosing or sliding scale.  He did eat 3 regular meals today.  He denies any recent illness.  Past Medical History:  Diagnosis Date  . Diabetes mellitus without complication ([place])     There are no active problems to display for this patient.   History reviewed. No pertinent surgical history.      Home Medications    Prior to Admission medications   Medication Sig Start Date End Date Taking? Authorizing Provider  Ibuprofen-Diphenhydramine Cit (IBUPROFEN PM) 200-38 MG TABS Take 2 tablets by mouth at bedtime as needed (PAIN, SLEEP).    [provider]  metFORMIN (GLUCOPHAGE) 500 MG tablet Take 1 tablet (500 mg total) by mouth 2 (two) times daily with a meal. 03/23/16   Dowless, Dondra Spry, PA-C    Family History Family History  Problem Relation Age of Onset  . Diabetes Mother     Social History Social History   Tobacco Use  . Smoking status: Never Smoker  . Smokeless tobacco: Never Used  Substance Use Topics  . Alcohol use: No    Alcohol/week: 0.0 oz  . Drug use: No     Allergies   Patient has no known allergies.   Review of Systems Review of Systems  Endocrine:       Hypoglycemia  All other systems reviewed and are  negative.    Physical Exam Updated Vital Signs BP (!) 152/93 (BP Location: Left Arm)   Pulse 97   Temp 97.6 F (36.4 C) (Oral)   Resp 18   SpO2 100%   Physical Exam  Constitutional: He is oriented to person, place, and time. He appears well-developed and well-nourished.  HENT:  Head: Normocephalic and atraumatic.  Mouth/Throat: Oropharynx is clear and moist.  Abrasion noted to right forehead, no skull depression or deformity, overall nontender  Eyes: Pupils are equal, round, and reactive to light. Conjunctivae and EOM are normal.  Neck: Normal range of motion.  Cardiovascular: Normal rate, regular rhythm and normal heart sounds.  Pulmonary/Chest: Effort normal and breath sounds normal. No stridor. No respiratory distress.  Abdominal: Soft. Bowel sounds are normal. There is no tenderness. There is no rebound.  Musculoskeletal: Normal range of motion.  Neurological: He is alert and oriented to person, place, and time.  AAOx3, answering questions and following commands appropriately; equal strength UE and LE bilaterally; CN grossly intact; moves all extremities appropriately without ataxia; no focal neuro deficits or facial asymmetry appreciated  Skin: Skin is warm and dry.  Psychiatric: He has a normal mood and affect.  Nursing note and vitals reviewed.    ED Treatments / Results  Labs (all labs ordered  are listed, but only abnormal results are displayed) Labs Reviewed  CBC WITH DIFFERENTIAL/PLATELET - Abnormal; Notable for the following components:      Result Value   HCT 38.9 (*)    All other components within normal limits  BASIC METABOLIC PANEL - Abnormal; Notable for the following components:   Potassium 3.2 (*)    Glucose, Bld 217 (*)    All other components within normal limits  URINALYSIS, ROUTINE W REFLEX MICROSCOPIC - Abnormal; Notable for the following components:   Color, Urine STRAW (*)    Glucose, UA >=500 (*)    Hgb urine dipstick MODERATE (*)    All  other components within normal limits  CBG MONITORING, ED - Abnormal; Notable for the following components:   Glucose-Capillary 285 (*)    All other components within normal limits  CBG MONITORING, ED - Abnormal; Notable for the following components:   Glucose-Capillary 256 (*)    All other components within normal limits    EKG None  Radiology No results found.  Procedures Procedures (including critical care time)  Medications Ordered in ED Medications - No data to display   Initial Impression / Assessment and Plan / ED Course  I have reviewed the triage vital signs and the nursing notes.  Pertinent labs & imaging results that were available during my care of the patient were reviewed by me and considered in my medical decision making (see chart for details).  47 year old male here after unwitnessed seizure.  Upon EMS arrival he was hypoglycemic into the 30s.  He was given glucagon and amp of D50 with significant improvement in his blood sugar.  He is awake, alert, appropriately oriented on arrival to the ED.  Abrasion to right forehead, otherwise atraumatic.  Denies complaints currently.  Admits to eating regular meals today but thinks he took too much of his short acting insulin.  He is on sliding scale.  Will send basic labs.  Patient given food and drink here.  Will monitor closely.  4:58 AM Patient has been observed here for about 3 hours.  He remains stable, NAD.  3 consecutive blood sugars have been reassuring.  He has no complaints.  Tolerating PO well.  Feel he is stable for discharge.  Will have him continue home meds, stick to sliding scale exactly.  If any questions about this or feels adjustments need to be made, he should speak with his PCP first.  He will schedule follow-up appt within the next week.  He understands to return here for any new/acute changes.  Final Clinical Impressions(s) / ED Diagnoses   Final diagnoses:  Hypoglycemia    ED Discharge Orders     None       Larene Pickett, PA-C 01/29/18 [place], [place], DO 01/29/18 [phone]

## 2018-01-29 NOTE — ED Notes (Signed)
Bed: WA17 Expected date:  Expected time:  Means of arrival:  Comments: EMS 47 yo male found unresponsive with CBG 34-glucagon and D50

## 2018-07-27 ENCOUNTER — Inpatient Hospital Stay (HOSPITAL_COMMUNITY)
Admission: EM | Admit: 2018-07-27 | Discharge: 2018-08-05 | DRG: 853 | Disposition: A | Discharge status: Home / Self Care | Payer: Self-pay | Attending: Internal Medicine | Admitting: Internal Medicine

## 2018-07-27 ENCOUNTER — Encounter (HOSPITAL_COMMUNITY): Payer: Self-pay | Admitting: *Deleted

## 2018-07-27 ENCOUNTER — Other Ambulatory Visit: Payer: Self-pay

## 2018-07-27 ENCOUNTER — Emergency Department (HOSPITAL_COMMUNITY): Payer: Self-pay

## 2018-07-27 DIAGNOSIS — Z794 Long term (current) use of insulin: Secondary | ICD-10-CM

## 2018-07-27 DIAGNOSIS — I9581 Postprocedural hypotension: Secondary | ICD-10-CM | POA: Diagnosis not present

## 2018-07-27 DIAGNOSIS — E1042 Type 1 diabetes mellitus with diabetic polyneuropathy: Secondary | ICD-10-CM | POA: Diagnosis present

## 2018-07-27 DIAGNOSIS — A419 Sepsis, unspecified organism: Principal | ICD-10-CM | POA: Diagnosis present

## 2018-07-27 DIAGNOSIS — E876 Hypokalemia: Secondary | ICD-10-CM | POA: Diagnosis not present

## 2018-07-27 DIAGNOSIS — L02612 Cutaneous abscess of left foot: Secondary | ICD-10-CM | POA: Diagnosis present

## 2018-07-27 DIAGNOSIS — L089 Local infection of the skin and subcutaneous tissue, unspecified: Secondary | ICD-10-CM

## 2018-07-27 DIAGNOSIS — E875 Hyperkalemia: Secondary | ICD-10-CM

## 2018-07-27 DIAGNOSIS — E119 Type 2 diabetes mellitus without complications: Secondary | ICD-10-CM | POA: Diagnosis present

## 2018-07-27 DIAGNOSIS — Z833 Family history of diabetes mellitus: Secondary | ICD-10-CM

## 2018-07-27 DIAGNOSIS — Z79899 Other long term (current) drug therapy: Secondary | ICD-10-CM

## 2018-07-27 DIAGNOSIS — E1110 Type 2 diabetes mellitus with ketoacidosis without coma: Secondary | ICD-10-CM

## 2018-07-27 DIAGNOSIS — R52 Pain, unspecified: Secondary | ICD-10-CM

## 2018-07-27 DIAGNOSIS — L03116 Cellulitis of left lower limb: Secondary | ICD-10-CM | POA: Diagnosis present

## 2018-07-27 DIAGNOSIS — E1010 Type 1 diabetes mellitus with ketoacidosis without coma: Secondary | ICD-10-CM | POA: Diagnosis present

## 2018-07-27 DIAGNOSIS — E10621 Type 1 diabetes mellitus with foot ulcer: Secondary | ICD-10-CM | POA: Diagnosis present

## 2018-07-27 DIAGNOSIS — E11628 Type 2 diabetes mellitus with other skin complications: Secondary | ICD-10-CM

## 2018-07-27 DIAGNOSIS — R000 Tachycardia, unspecified: Secondary | ICD-10-CM | POA: Diagnosis present

## 2018-07-27 DIAGNOSIS — D72829 Elevated white blood cell count, unspecified: Secondary | ICD-10-CM

## 2018-07-27 DIAGNOSIS — A401 Sepsis due to streptococcus, group B: Secondary | ICD-10-CM

## 2018-07-27 DIAGNOSIS — E871 Hypo-osmolality and hyponatremia: Secondary | ICD-10-CM

## 2018-07-27 DIAGNOSIS — L97529 Non-pressure chronic ulcer of other part of left foot with unspecified severity: Secondary | ICD-10-CM | POA: Diagnosis present

## 2018-07-27 DIAGNOSIS — R112 Nausea with vomiting, unspecified: Secondary | ICD-10-CM | POA: Diagnosis present

## 2018-07-27 DIAGNOSIS — R300 Dysuria: Secondary | ICD-10-CM | POA: Diagnosis present

## 2018-07-27 DIAGNOSIS — Z9119 Patient's noncompliance with other medical treatment and regimen: Secondary | ICD-10-CM

## 2018-07-27 DIAGNOSIS — R358 Other polyuria: Secondary | ICD-10-CM | POA: Diagnosis present

## 2018-07-27 LAB — URINALYSIS, ROUTINE W REFLEX MICROSCOPIC
Bacteria, UA: NONE SEEN
Bilirubin Urine: NEGATIVE
Glucose, UA: >=500 mg/dL — AB
KETONES UR: 80 mg/dL — AB
Leukocytes, UA: NEGATIVE
Nitrite: NEGATIVE
PH: 5 (ref 5.0–8.0)
Protein, ur: 100 mg/dL — AB
Specific Gravity, Urine: 1.02 (ref 1.005–1.030)

## 2018-07-27 LAB — CBC WITH DIFFERENTIAL/PLATELET
ABS IMMATURE GRANULOCYTES: 0.06 10*3/uL (ref 0.00–0.07)
BASOS ABS: 0 10*3/uL (ref 0.0–0.1)
Basophils Relative: 0 %
Eosinophils Absolute: 0 10*3/uL (ref 0.0–0.5)
Eosinophils Relative: 0 %
HCT: 42.9 % (ref 39.0–52.0)
HEMOGLOBIN: 13.3 g/dL (ref 13.0–17.0)
Immature Granulocytes: 0 %
LYMPHS PCT: 4 %
Lymphs Abs: 0.5 10*3/uL — ABNORMAL LOW (ref 0.7–4.0)
MCH: 29.2 pg (ref 26.0–34.0)
MCHC: 31 g/dL (ref 30.0–36.0)
MCV: 94.3 fL (ref 80.0–100.0)
Monocytes Absolute: 1.1 10*3/uL — ABNORMAL HIGH (ref 0.1–1.0)
Monocytes Relative: 8 %
NEUTROS ABS: 12.9 10*3/uL — AB (ref 1.7–7.7)
NEUTROS PCT: 88 %
NRBC: 0 % (ref 0.0–0.2)
PLATELETS: 261 10*3/uL (ref 150–400)
RBC: 4.55 MIL/uL (ref 4.22–5.81)
RDW: 14.7 % (ref 11.5–15.5)
WBC: 14.7 10*3/uL — AB (ref 4.0–10.5)

## 2018-07-27 LAB — COMPREHENSIVE METABOLIC PANEL
ALT: 20 U/L (ref 0–44)
ANION GAP: 24 — AB (ref 5–15)
AST: 18 U/L (ref 15–41)
Albumin: 3.5 g/dL (ref 3.5–5.0)
Alkaline Phosphatase: 75 U/L (ref 38–126)
BILIRUBIN TOTAL: 1.7 mg/dL — AB (ref 0.3–1.2)
BUN: 15 mg/dL (ref 6–20)
CHLORIDE: 101 mmol/L (ref 98–111)
CO2: 7 mmol/L — ABNORMAL LOW (ref 22–32)
Calcium: 8.9 mg/dL (ref 8.9–10.3)
Creatinine, Ser: 1.14 mg/dL (ref 0.61–1.24)
Glucose, Bld: 380 mg/dL — ABNORMAL HIGH (ref 70–99)
POTASSIUM: 5.2 mmol/L — AB (ref 3.5–5.1)
Sodium: 132 mmol/L — ABNORMAL LOW (ref 135–145)
TOTAL PROTEIN: 8.3 g/dL — AB (ref 6.5–8.1)

## 2018-07-27 LAB — CBG MONITORING, ED: GLUCOSE-CAPILLARY: 396 mg/dL — AB (ref 70–99)

## 2018-07-27 LAB — I-STAT CG4 LACTIC ACID, ED: LACTIC ACID, VENOUS: 1.38 mmol/L (ref 0.5–1.9)

## 2018-07-27 MED ORDER — ACETAMINOPHEN 325 MG PO TABS
650.0000 mg | ORAL_TABLET | Freq: Once | ORAL | Status: AC
  Administered 2018-07-28: 650 mg via ORAL
  Filled 2018-07-27: qty 2

## 2018-07-27 MED ORDER — DEXTROSE 50 % IV SOLN: 25.0000 mL | INTRAVENOUS | Status: DC | PRN

## 2018-07-27 MED ORDER — ONDANSETRON HCL 4 MG/2ML IJ SOLN
4.0000 mg | Freq: Once | INTRAMUSCULAR | Status: AC
  Administered 2018-07-28: 4 mg via INTRAVENOUS
  Filled 2018-07-27: qty 2

## 2018-07-27 MED ORDER — INSULIN REGULAR BOLUS VIA INFUSION
0.0000 [IU] | Freq: Three times a day (TID) | INTRAVENOUS | Status: DC
  Filled 2018-07-27: qty 10

## 2018-07-27 MED ORDER — INSULIN REGULAR(HUMAN) IN NACL 100-0.9 UT/100ML-% IV SOLN
INTRAVENOUS | Status: DC
  Filled 2018-07-27: qty 100

## 2018-07-27 MED ORDER — SODIUM CHLORIDE 0.9 % IV BOLUS
1000.0000 mL | Freq: Once | INTRAVENOUS | Status: AC
  Administered 2018-07-28: 1000 mL via INTRAVENOUS

## 2018-07-27 MED ORDER — SODIUM CHLORIDE 0.9 % IV SOLN: INTRAVENOUS | Status: DC

## 2018-07-27 MED ORDER — DEXTROSE-NACL 5-0.45 % IV SOLN: INTRAVENOUS | Status: DC

## 2018-07-27 NOTE — H&P (Signed)
History and Physical    Taylor Hughes [id number] DOB: Aug 19, 1970 DOA: 07/27/2018  Referring MD/NP/PA: Delora Fuel, MD PCP: Jilda Panda, MD  Patient coming from: Home via EMS  Chief Complaint: Nausea, vomiting, and chills  I have personally briefly reviewed patient's old medical records in [place]   HPI: Taylor Hughes is a 47 y.o. male with medical history significant of diabetes mellitus type 2 on insulin; who presents with complaints of nausea, vomiting, and chills.  Notes that he may have caught something from 1 of his grandkids were always running around coughing and sick.  He has had several episodes of nonbloody and nonbilious emesis unable to keep any significant amount of food or liquids down.  Other associated symptoms include generalized weakness, subjective fevers malaise, mild cough, mild shortness of breath, urinary frequency, dry mouth, and polydipsia. Patient reports recently changing jobs for which he had a change in insurance and was unable to take his insulin over the last 5 days.  He reports that he just received the paperwork and should have his insurance back within a week.  Denies having any abdominal pain, diarrhea, or chest pain.  ED Course: Upon admission to the emergency department patient was noted to be afebrile, pulse 114-122, respiration 27-28, and all other vital signs maintained.  Labs revealed WBC 14.7, sodium 132, potassium 4.2, heart 101, CO2 7, glucose 380, anion gap 24, and lactic acid 1.38.  Urinalysis revealed >500 glucose moderate hemoglobin, and 80 ketones.  Patient was given 1 L normal saline IV fluids and started on DKA protocol.  Venous blood gas yet to be obtained.    Review of Systems  Constitutional: Positive for chills, fever and malaise/fatigue.  HENT: Negative for ear discharge and nosebleeds.   Eyes: Negative for photophobia and pain.  Respiratory: Positive for cough and shortness of breath. Negative for sputum production.     Cardiovascular: Negative for chest pain and leg swelling.  Gastrointestinal: Positive for nausea and vomiting. Negative for abdominal pain.  Genitourinary: Positive for frequency. Negative for flank pain.  Musculoskeletal: Negative for joint pain and myalgias.  Skin: Negative for itching and rash.  Neurological: Negative for loss of consciousness.  Endo/Heme/Allergies: Positive for polydipsia. Does not bruise/bleed easily.  Psychiatric/Behavioral: Negative for substance abuse and suicidal ideas.    Past Medical History:  Diagnosis Date  . Diabetes mellitus without complication ([place])     History reviewed. No pertinent surgical history.   reports that he has never smoked. He has never used smokeless tobacco. He reports that he does not drink alcohol or use drugs.  No Known Allergies  Family History  Problem Relation Age of Onset  . Diabetes Mother     Prior to Admission medications   Medication Sig Start Date End Date Taking? Authorizing Provider  acetaminophen (TYLENOL) 500 MG tablet Take 1,000 mg by mouth daily as needed (pain).   Yes [provider]  insulin aspart (NOVOLOG FLEXPEN) 100 UNIT/ML FlexPen Inject 10 Units into the skin daily. Between noon and 2   Yes [provider]  Insulin Detemir (LEVEMIR FLEXPEN ) Inject 22 Units into the skin every morning.   Yes [provider]  metFORMIN (GLUCOPHAGE) 500 MG tablet Take 1 tablet (500 mg total) by mouth 2 (two) times daily with a meal. Patient not taking: Reported on 07/27/2018 03/23/16   Dowless, Dondra Spry, PA-C    Physical Exam:  Constitutional: Middle-age male who appears to be in moderate distress Vitals:   07/27/18  2133 07/27/18 2136 07/27/18 2217 07/27/18 2230  BP: (!) 143/87  134/81 127/75  Pulse: (!) 122  (!) 115 (!) 114  Resp: (!) 28  (!) 27 (!) 28  Temp: 99.5 F (37.5 C)     TempSrc: Oral     SpO2: 99%  99% 99%  Weight:  111.1 kg    Height:  5\' 11"  (1.803 m)     Eyes:  PERRL, lids and conjunctivae normal ENMT: Mucous membranes are moist. Posterior pharynx clear of any exudate or lesions. ketone odor to breath..  Neck: normal, supple, no masses, no thyromegaly Respiratory: Tachypneic with Kussmaul respirations.  Patient able to talk in complete sentences Cardiovascular: Tachycardic, no murmurs / rubs / gallops. No extremity edema. 2+ pedal pulses. No carotid bruits.  Abdomen: no tenderness, no masses palpated. No hepatosplenomegaly. Bowel sounds positive.  Musculoskeletal: no clubbing / cyanosis. No joint deformity upper and lower extremities. Good ROM, no contractures. Normal muscle tone.  Skin: no rashes, lesions, ulcers. No induration.  Poor skin turgor Neurologic: CN 2-12 grossly intact. Sensation intact, DTR normal. Strength 5/5 in all 4.  Psychiatric: Normal judgment and insight. Alert and oriented x 3. Normal mood.     Labs on Admission: I have personally reviewed following labs and imaging studies  CBC: Recent Labs  Lab 07/27/18 2216  WBC 14.7*  NEUTROABS 12.9*  HGB 13.3  HCT 42.9  MCV 94.3  PLT 301   Basic Metabolic Panel: Recent Labs  Lab 07/27/18 2216  NA 132*  K 5.2*  CL 101  CO2 7*  GLUCOSE 380*  BUN 15  CREATININE 1.14  CALCIUM 8.9   GFR: Estimated Creatinine Clearance: 102.6 mL/min (by C-G formula based on SCr of 1.14 mg/dL). Liver Function Tests: Recent Labs  Lab 07/27/18 2216  AST 18  ALT 20  ALKPHOS 75  BILITOT 1.7*  PROT 8.3*  ALBUMIN 3.5   No results for input(s): LIPASE, AMYLASE in the last 168 hours. No results for input(s): AMMONIA in the last 168 hours. Coagulation Profile: No results for input(s): INR, PROTIME in the last 168 hours. Cardiac Enzymes: No results for input(s): CKTOTAL, CKMB, CKMBINDEX, TROPONINI in the last 168 hours. BNP (last 3 results) No results for input(s): PROBNP in the last 8760 hours. HbA1C: No results for input(s): HGBA1C in the last 72 hours. CBG: Recent Labs  Lab  07/27/18 2148  GLUCAP 396*   Lipid Profile: No results for input(s): CHOL, HDL, LDLCALC, TRIG, CHOLHDL, LDLDIRECT in the last 72 hours. Thyroid Function Tests: No results for input(s): TSH, T4TOTAL, FREET4, T3FREE, THYROIDAB in the last 72 hours. Anemia Panel: No results for input(s): VITAMINB12, FOLATE, FERRITIN, TIBC, IRON, RETICCTPCT in the last 72 hours. Urine analysis:    Component Value Date/Time   COLORURINE YELLOW 07/27/2018 2216   APPEARANCEUR CLEAR 07/27/2018 2216   LABSPEC 1.020 07/27/2018 2216   PHURINE 5.0 07/27/2018 2216   GLUCOSEU >=500 (A) 07/27/2018 2216   HGBUR MODERATE (A) 07/27/2018 2216   BILIRUBINUR NEGATIVE 07/27/2018 2216   KETONESUR 80 (A) 07/27/2018 2216   PROTEINUR 100 (A) 07/27/2018 2216   NITRITE NEGATIVE 07/27/2018 2216   LEUKOCYTESUR NEGATIVE 07/27/2018 2216   Sepsis Labs: No results found for this or any previous visit (from the past 240 hour(s)).   Radiological Exams on Admission: Dg Chest 2 View  Result Date: 07/27/2018 CLINICAL DATA:  Fever, chills and body aches EXAM: CHEST - 2 VIEW COMPARISON:  08/05/2006 FINDINGS: Normal heart size and mediastinal contours. Mild central  vascular congestion without alveolar consolidation. No effusion or pneumothorax. 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 IMPRESSION: Mild central vascular congestion. Electronically Signed   By: Ashley Royalty M.D.   On: 07/27/2018 22:18    EKG: Independently reviewed.  Sinus tachycardia at 114 bpm question early re-pole  Assessment/Plan DKA, type II: Acute.  Presents with initial glucose of 380 with CO2 7 and anion gap of 24.  Urinalysis positive for ketones and glucose. Initial venous blood gas was noted to have a pH of 7.2.  Patient was given 1 L of normal saline IV fluids and started on insulin drip. - Admit to stepdown unit  - DKA protocol initiated  - Normal saline IV fluids 125 mL/h -  Serial BMPs, hemoglobin A1c in a.m.  - Correct electrolytes as needed - Monitoring for AG closure and will transition to subcutaneous insulin once able - Care management consult for need of PCP and barriers of  Leukocytosis: WBC elevated at 14.7.  Urinalysis did not show any acute signs of infection.  Chest x-ray did not show any acute infiltrate - Check respiratory virus panel  Abnormal EKG: Patient found to have sinus tachycardia with signs of ST-T wave changes EKG.  - Follow-up troponin and telemetry overnight  Hyperkalemia: Acute.  Initial potassium mildly elevated at 5.2. - IV fluids as seen above  Hyponatremia: Acute.  Sodium noted to be 132, but when troponin for hypercalcemia noted to be within normal limits. - Continue to monitor   DVT prophylaxis: lovenox Code Status: Full  Family Communication: No family present at bedside Disposition Plan: Likely discharge home once medically stable Consults called: None Admission status:Observation  Norval Morton MD [facility] Pager [phone]   If 7PM-7AM, please contact night-coverage [email or web address] Password [facility]  07/27/2018, 11:53 PM

## 2018-07-27 NOTE — ED Provider Notes (Signed)
[facility] DEPT Provider Note   CSN: [id number] Arrival date & time: 07/27/18  2059     History   Chief Complaint Chief Complaint  Patient presents with  . Hyperglycemia    HPI Taylor Hughes is a 47 y.o. male.  The history is provided by the patient.  Hyperglycemia  He has history of diabetes and comes in with 5-day history of feeling weak, nausea, vomiting, subjective fever, chills, polyuria, dysuria.  He has changed jobs and therefore, changed insurances and has run out of his insulin about 5 days ago.  He knows that his sugar is high.  He denies cough, chest pain, diarrhea.  He denies arthralgias or myalgias.  Past Medical History:  Diagnosis Date  . Diabetes mellitus without complication ([place])     There are no active problems to display for this patient.   History reviewed. No pertinent surgical history.      Home Medications    Prior to Admission medications   Medication Sig Start Date End Date Taking? Authorizing Provider  acetaminophen (TYLENOL) 500 MG tablet Take 1,000 mg by mouth daily as needed (pain).   Yes [provider]  insulin aspart (NOVOLOG FLEXPEN) 100 UNIT/ML FlexPen Inject 10 Units into the skin daily. Between noon and 2   Yes [provider]  Insulin Detemir (LEVEMIR FLEXPEN [place]) Inject 22 Units into the skin every morning.   Yes [provider]  metFORMIN (GLUCOPHAGE) 500 MG tablet Take 1 tablet (500 mg total) by mouth 2 (two) times daily with a meal. Patient not taking: Reported on 07/27/2018 03/23/16   Dowless, Dondra Spry, PA-C    Family History Family History  Problem Relation Age of Onset  . Diabetes Mother     Social History Social History   Tobacco Use  . Smoking status: Never Smoker  . Smokeless tobacco: Never Used  Substance Use Topics  . Alcohol use: No    Alcohol/week: 0.0 standard drinks  . Drug use: No     Allergies   Patient has no known  allergies.   Review of Systems Review of Systems  All other systems reviewed and are negative.    Physical Exam Updated Vital Signs BP 127/75   Pulse (!) 114   Temp 99.5 F (37.5 C) (Oral)   Resp (!) 28   Ht 5\' 11"  (1.803 m)   Wt 111.1 kg   SpO2 99%   BMI 34.17 kg/m   Physical Exam Vitals signs and nursing note reviewed.    47 year old male, resting comfortably and in no acute distress. Vital signs are significant for rapid heart rate and respiratory rate.  [place]ul respirations are noted. Oxygen saturation is 99%, which is normal. Head is normocephalic and atraumatic. PERRLA, EOMI. Oropharynx is clear. Neck is nontender and supple without adenopathy or JVD. Back is nontender and there is no CVA tenderness. Lungs are clear without rales, wheezes, or rhonchi. Chest is nontender. Heart has regular rate and rhythm without murmur. Abdomen is soft, flat, nontender without masses or hepatosplenomegaly and peristalsis is hypoactive. Extremities have no cyanosis or edema, full range of motion is present. Skin is warm and dry without rash. Neurologic: Mental status is normal, cranial nerves are intact, there are no motor or sensory deficits.  ED Treatments / Results  Labs (all labs ordered are listed, but only abnormal results are displayed) Labs Reviewed  URINALYSIS, ROUTINE W REFLEX MICROSCOPIC - Abnormal; Notable for the following components:  Result Value   Glucose, UA >=500 (*)    Hgb urine dipstick MODERATE (*)    Ketones, ur 80 (*)    Protein, ur 100 (*)    All other components within normal limits  COMPREHENSIVE METABOLIC PANEL - Abnormal; Notable for the following components:   Sodium 132 (*)    Potassium 5.2 (*)    CO2 7 (*)    Glucose, Bld 380 (*)    Total Protein 8.3 (*)    Total Bilirubin 1.7 (*)    Anion gap 24 (*)    All other components within normal limits  CBC WITH DIFFERENTIAL/PLATELET - Abnormal; Notable for the following components:   WBC  14.7 (*)    Neutro Abs 12.9 (*)    Lymphs Abs 0.5 (*)    Monocytes Absolute 1.1 (*)    All other components within normal limits  BLOOD GAS, VENOUS - Abnormal; Notable for the following components:   pH, Ven 7.203 (*)    pO2, Ven 89.1 (*)    All other components within normal limits  BASIC METABOLIC PANEL - Abnormal; Notable for the following components:   Sodium 133 (*)    Potassium 5.3 (*)    CO2 <7 (*)    Glucose, Bld 379 (*)    All other components within normal limits  CBG MONITORING, ED - Abnormal; Notable for the following components:   Glucose-Capillary 396 (*)    All other components within normal limits  CBG MONITORING, ED - Abnormal; Notable for the following components:   Glucose-Capillary 330 (*)    All other components within normal limits  RESPIRATORY PANEL BY PCR  TROPONIN I  LIPASE, BLOOD  HIV ANTIBODY (ROUTINE TESTING W REFLEX)  BASIC METABOLIC PANEL  BASIC METABOLIC PANEL  HEMOGLOBIN A1C  CBC  I-STAT CG4 LACTIC ACID, ED  I-STAT CG4 LACTIC ACID, ED    EKG EKG Interpretation  Date/Time:  Wednesday July 28 2018 00:14:39 EST Ventricular Rate:  115 PR Interval:    QRS Duration: 82 QT Interval:  307 QTC Calculation: 425 R Axis:   57 Text Interpretation:  Sinus tachycardia ST elev, probable normal early repol pattern When compared with ECG of 03/23/2016, Nonspecific T wave abnormality has resolved Confirmed by Delora Fuel ([id number]) on 07/28/2018 1:34:59 AM   Radiology Dg Chest 2 View  Result Date: 07/27/2018 CLINICAL DATA:  Fever, chills and body aches EXAM: CHEST - 2 VIEW COMPARISON:  08/05/2006 FINDINGS: Normal heart size and mediastinal contours. Mild central vascular congestion without alveolar consolidation. No effusion or pneumothorax. 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 IMPRESSION: Mild central vascular congestion. Electronically Signed   By: Ashley Royalty M.D.    On: 07/27/2018 22:18    Procedures Procedures  CRITICAL CARE Performed by: Delora Fuel Total critical care time: 45 minutes Critical care time was exclusive of separately billable procedures and treating other patients. Critical care was necessary to treat or prevent imminent or life-threatening deterioration. Critical care was time spent personally by me on the following activities: development of treatment plan with patient and/or surrogate as well as nursing, discussions with consultants, evaluation of patient's response to treatment, examination of patient, obtaining history from patient or surrogate, ordering and performing treatments and interventions, ordering and review of laboratory studies, ordering and review of radiographic studies, pulse oximetry and re-evaluation of patient's condition.  Medications Ordered in ED Medications  0.9 %  sodium chloride infusion (has  no administration in time range)  0.9 %  sodium chloride infusion (has no administration in time range)  insulin regular, human (MYXREDLIN) 100 units/ 100 mL infusion (2.7 Units/hr Intravenous New Bag/Given 07/28/18 0041)  enoxaparin (LOVENOX) injection 40 mg (40 mg Subcutaneous Given 07/28/18 0130)  sodium chloride flush (NS) 0.9 % injection 3 mL (has no administration in time range)  ondansetron (ZOFRAN) tablet 4 mg (has no administration in time range)    Or  ondansetron (ZOFRAN) injection 4 mg (has no administration in time range)  acetaminophen (TYLENOL) tablet 650 mg (has no administration in time range)    Or  acetaminophen (TYLENOL) suppository 650 mg (has no administration in time range)  dextrose 5 %-0.45 % sodium chloride infusion (has no administration in time range)  albuterol (PROVENTIL) (2.5 MG/3ML) 0.083% nebulizer solution 2.5 mg (has no administration in time range)  ondansetron (ZOFRAN) injection 4 mg (4 mg Intravenous Given 07/28/18 0000)  sodium chloride 0.9 % bolus 1,000 mL (1,000 mLs  Intravenous New Bag/Given 07/28/18 0001)  acetaminophen (TYLENOL) tablet 650 mg (650 mg Oral Given 07/28/18 0010)     Initial Impression / Assessment and Plan / ED Course  I have reviewed the triage vital signs and the nursing notes.  Pertinent labs & imaging results that were available during my care of the patient were reviewed by me and considered in my medical decision making (see chart for details).  Patient with insulin-dependent diabetes off of insulin for 5 days and clinically showing signs of ketoacidosis.  Labs confirmed ketoacidosis with CO2 of 7, glucose 380, anion gap of 24.  Lactic acid level is normal.  Chest x-ray shows no evidence of pneumonia.  Old records are reviewed, and he does have ED visit for elevated glucose without ketoacidosis, and ED visits for hypoglycemia.  He is started on IV fluids, given ondansetron for nausea, and started on insulin drip for ketoacidosis.  Additional labs will be obtained including venous blood gas, troponin, lipase.  He will need to be admitted.  Case is discussed with Dr. Tamala Julian of [facility], who agrees to admit the patient.  Chest x-ray shows no evidence of pneumonia.  ECG shows no acute changes.  Venous blood gas shows significant metabolic acidosis.  Lipase is normal and troponin is normal.  Final Clinical Impressions(s) / ED Diagnoses   Final diagnoses:  Diabetic ketoacidosis without coma associated with type 1 diabetes mellitus [facility])    ED Discharge Orders    None       Delora Fuel, MD 38/25/05 [phone]

## 2018-07-27 NOTE — ED Notes (Signed)
Pt sts he recently changed jobs and is waiting for new insurance card, and due to this he has been out of insulin for about 5 days now. Pt also sts his grandchildren have had cold sx, with cough and runny nose and have been "running around the house and using my phone", and he thinks he might have picked up a virus or something form them. He reports some body aches and occasional cough as well as headache, but also increased thirst and urination.

## 2018-07-27 NOTE — ED Triage Notes (Addendum)
Per EMS pt form home with c/o hyperglycemia, HTN, n/v/ and fever and chills. Per EMS pt reports out of insulin x 5 days. PIV established en route, 4 mg of zofran and 500 ml NS administered

## 2018-07-27 NOTE — ED Notes (Signed)
Bed: HS30 Expected date:  Expected time:  Means of arrival:  Comments: 47 yr old fever, chills, hyperglycemia

## 2018-07-28 ENCOUNTER — Other Ambulatory Visit: Payer: Self-pay

## 2018-07-28 DIAGNOSIS — D72829 Elevated white blood cell count, unspecified: Secondary | ICD-10-CM | POA: Diagnosis present

## 2018-07-28 DIAGNOSIS — E871 Hypo-osmolality and hyponatremia: Secondary | ICD-10-CM | POA: Diagnosis present

## 2018-07-28 DIAGNOSIS — E119 Type 2 diabetes mellitus without complications: Secondary | ICD-10-CM | POA: Diagnosis present

## 2018-07-28 DIAGNOSIS — E875 Hyperkalemia: Secondary | ICD-10-CM | POA: Diagnosis present

## 2018-07-28 DIAGNOSIS — E1110 Type 2 diabetes mellitus with ketoacidosis without coma: Secondary | ICD-10-CM | POA: Diagnosis present

## 2018-07-28 LAB — BASIC METABOLIC PANEL
Anion gap: 14 (ref 5–15)
Anion gap: 10 (ref 5–15)
BUN: 15 mg/dL (ref 6–20)
BUN: 15 mg/dL (ref 6–20)
BUN: 15 mg/dL (ref 6–20)
CO2: 12 mmol/L — ABNORMAL LOW (ref 22–32)
CO2: 16 mmol/L — AB (ref 22–32)
CO2: <7 mmol/L — ABNORMAL LOW (ref 22–32)
Calcium: 8.9 mg/dL (ref 8.9–10.3)
Calcium: 8.8 mg/dL — ABNORMAL LOW (ref 8.9–10.3)
Calcium: 8.8 mg/dL — ABNORMAL LOW (ref 8.9–10.3)
Chloride: 103 mmol/L (ref 98–111)
Chloride: 109 mmol/L (ref 98–111)
Chloride: 106 mmol/L (ref 98–111)
Creatinine, Ser: 1.15 mg/dL (ref 0.61–1.24)
Creatinine, Ser: 0.92 mg/dL (ref 0.61–1.24)
Creatinine, Ser: 0.84 mg/dL (ref 0.61–1.24)
GFR calc Af Amer: >60 mL/min (ref >60)
GFR calc non Af Amer: >60 mL/min (ref >60)
GFR calc non Af Amer: >60 mL/min (ref >60)
GFR calc non Af Amer: >60 mL/min (ref >60)
Glucose, Bld: 379 mg/dL — ABNORMAL HIGH (ref 70–99)
Glucose, Bld: 192 mg/dL — ABNORMAL HIGH (ref 70–99)
Glucose, Bld: 164 mg/dL — ABNORMAL HIGH (ref 70–99)
Potassium: 5.3 mmol/L — ABNORMAL HIGH (ref 3.5–5.1)
Potassium: 4.2 mmol/L (ref 3.5–5.1)
Potassium: 3.9 mmol/L (ref 3.5–5.1)
Sodium: 133 mmol/L — ABNORMAL LOW (ref 135–145)
Sodium: 135 mmol/L (ref 135–145)
Sodium: 132 mmol/L — ABNORMAL LOW (ref 135–145)

## 2018-07-28 LAB — RESPIRATORY PANEL BY PCR
Adenovirus: NOT DETECTED
Bordetella pertussis: NOT DETECTED
Chlamydophila pneumoniae: NOT DETECTED
Coronavirus 229E: NOT DETECTED
Coronavirus HKU1: NOT DETECTED
Coronavirus NL63: NOT DETECTED
Coronavirus OC43: NOT DETECTED
Influenza A: NOT DETECTED
Influenza B: NOT DETECTED
Metapneumovirus: NOT DETECTED
Mycoplasma pneumoniae: NOT DETECTED
PARAINFLUENZA VIRUS 3-RVPPCR: NOT DETECTED
Parainfluenza Virus 1: NOT DETECTED
Parainfluenza Virus 2: NOT DETECTED
Parainfluenza Virus 4: DETECTED — AB
Respiratory Syncytial Virus: NOT DETECTED
Rhinovirus / Enterovirus: NOT DETECTED

## 2018-07-28 LAB — GLUCOSE, CAPILLARY
GLUCOSE-CAPILLARY: 151 mg/dL — AB (ref 70–99)
GLUCOSE-CAPILLARY: 215 mg/dL — AB (ref 70–99)
GLUCOSE-CAPILLARY: 219 mg/dL — AB (ref 70–99)
Glucose-Capillary: 306 mg/dL — ABNORMAL HIGH (ref 70–99)
Glucose-Capillary: 241 mg/dL — ABNORMAL HIGH (ref 70–99)
Glucose-Capillary: 198 mg/dL — ABNORMAL HIGH (ref 70–99)
Glucose-Capillary: 173 mg/dL — ABNORMAL HIGH (ref 70–99)
Glucose-Capillary: 162 mg/dL — ABNORMAL HIGH (ref 70–99)
Glucose-Capillary: 156 mg/dL — ABNORMAL HIGH (ref 70–99)
Glucose-Capillary: 134 mg/dL — ABNORMAL HIGH (ref 70–99)
Glucose-Capillary: 116 mg/dL — ABNORMAL HIGH (ref 70–99)
Glucose-Capillary: 118 mg/dL — ABNORMAL HIGH (ref 70–99)
Glucose-Capillary: 208 mg/dL — ABNORMAL HIGH (ref 70–99)

## 2018-07-28 LAB — CBG MONITORING, ED
Glucose-Capillary: 315 mg/dL — ABNORMAL HIGH (ref 70–99)
Glucose-Capillary: 330 mg/dL — ABNORMAL HIGH (ref 70–99)

## 2018-07-28 LAB — HEMOGLOBIN A1C
Hgb A1c MFr Bld: 9.6 % — ABNORMAL HIGH (ref 4.8–5.6)
Mean Plasma Glucose: 228.82 mg/dL

## 2018-07-28 LAB — CBC
HCT: 39.6 % (ref 39.0–52.0)
Hemoglobin: 12.6 g/dL — ABNORMAL LOW (ref 13.0–17.0)
MCH: 30.3 pg (ref 26.0–34.0)
MCHC: 31.8 g/dL (ref 30.0–36.0)
MCV: 95.2 fL (ref 80.0–100.0)
Platelets: 252 10*3/uL (ref 150–400)
RBC: 4.16 MIL/uL — ABNORMAL LOW (ref 4.22–5.81)
RDW: 14.8 % (ref 11.5–15.5)
WBC: 14.3 10*3/uL — ABNORMAL HIGH (ref 4.0–10.5)
nRBC: 0.1 % (ref 0.0–0.2)

## 2018-07-28 LAB — BLOOD GAS, VENOUS
FIO2: 21
O2 Saturation: 94.1 %
Patient temperature: 98.6
pH, Ven: 7.203 — ABNORMAL LOW (ref 7.250–7.430)
pO2, Ven: 89.1 mmHg — ABNORMAL HIGH (ref 32.0–45.0)

## 2018-07-28 LAB — HIV ANTIBODY (ROUTINE TESTING W REFLEX): HIV Screen 4th Generation wRfx: NONREACTIVE

## 2018-07-28 LAB — LIPASE, BLOOD: LIPASE: 24 U/L (ref 11–51)

## 2018-07-28 LAB — I-STAT CG4 LACTIC ACID, ED: Lactic Acid, Venous: 1.27 mmol/L (ref 0.5–1.9)

## 2018-07-28 LAB — MRSA PCR SCREENING: MRSA BY PCR: NEGATIVE

## 2018-07-28 LAB — TROPONIN I: Troponin I: <0.03 ng/mL (ref <0.03)

## 2018-07-28 MED ORDER — INSULIN DETEMIR 100 UNIT/ML ~~LOC~~ SOLN
15.0000 [IU] | SUBCUTANEOUS | Status: DC
  Administered 2018-07-28: 15 [IU] via SUBCUTANEOUS
  Filled 2018-07-28 (×2): qty 0.15

## 2018-07-28 MED ORDER — ALBUTEROL SULFATE (2.5 MG/3ML) 0.083% IN NEBU: 2.5000 mg | INHALATION_SOLUTION | Freq: Four times a day (QID) | RESPIRATORY_TRACT | Status: DC | PRN

## 2018-07-28 MED ORDER — IBUPROFEN 200 MG PO TABS
400.0000 mg | ORAL_TABLET | Freq: Once | ORAL | Status: AC
  Administered 2018-07-28: 400 mg via ORAL
  Filled 2018-07-28: qty 2

## 2018-07-28 MED ORDER — ONDANSETRON HCL 4 MG/2ML IJ SOLN
4.0000 mg | Freq: Four times a day (QID) | INTRAMUSCULAR | Status: DC | PRN
  Administered 2018-07-28 – 2018-07-31 (×3): 4 mg via INTRAVENOUS
  Filled 2018-07-28 (×3): qty 2

## 2018-07-28 MED ORDER — SODIUM CHLORIDE 0.9 % IV BOLUS
500.0000 mL | Freq: Once | INTRAVENOUS | Status: AC
  Administered 2018-07-28: 500 mL via INTRAVENOUS

## 2018-07-28 MED ORDER — PNEUMOCOCCAL VAC POLYVALENT 25 MCG/0.5ML IJ INJ
0.5000 mL | INJECTION | INTRAMUSCULAR | Status: DC
  Filled 2018-07-28: qty 0.5

## 2018-07-28 MED ORDER — INSULIN ASPART 100 UNIT/ML ~~LOC~~ SOLN
0.0000 [IU] | Freq: Every day | SUBCUTANEOUS | Status: DC
  Administered 2018-07-28: 2 [IU] via SUBCUTANEOUS

## 2018-07-28 MED ORDER — ACETAMINOPHEN 650 MG RE SUPP
650.0000 mg | Freq: Four times a day (QID) | RECTAL | Status: DC | PRN
  Administered 2018-07-28: 650 mg via RECTAL
  Filled 2018-07-28: qty 1

## 2018-07-28 MED ORDER — ACETAMINOPHEN 325 MG PO TABS
650.0000 mg | ORAL_TABLET | Freq: Four times a day (QID) | ORAL | Status: DC | PRN
  Administered 2018-07-28 – 2018-08-01 (×2): 650 mg via ORAL
  Filled 2018-07-28 (×4): qty 2

## 2018-07-28 MED ORDER — SODIUM CHLORIDE 0.9 % IV SOLN: INTRAVENOUS | Status: DC

## 2018-07-28 MED ORDER — ONDANSETRON HCL 4 MG PO TABS: 4.0000 mg | ORAL_TABLET | Freq: Four times a day (QID) | ORAL | Status: DC | PRN

## 2018-07-28 MED ORDER — INFLUENZA VAC SPLIT QUAD 0.5 ML IM SUSY: 0.5000 mL | PREFILLED_SYRINGE | INTRAMUSCULAR | Status: DC

## 2018-07-28 MED ORDER — ENOXAPARIN SODIUM 40 MG/0.4ML ~~LOC~~ SOLN
40.0000 mg | Freq: Every day | SUBCUTANEOUS | Status: DC
  Administered 2018-07-28 – 2018-08-04 (×9): 40 mg via SUBCUTANEOUS
  Filled 2018-07-28 (×9): qty 0.4

## 2018-07-28 MED ORDER — SODIUM CHLORIDE 0.9% FLUSH
3.0000 mL | Freq: Two times a day (BID) | INTRAVENOUS | Status: DC
  Administered 2018-07-28 – 2018-08-04 (×12): 3 mL via INTRAVENOUS

## 2018-07-28 MED ORDER — SODIUM CHLORIDE 0.9 % IV SOLN
INTRAVENOUS | Status: DC
  Administered 2018-07-28: 03:00:00 via INTRAVENOUS

## 2018-07-28 MED ORDER — INSULIN ASPART 100 UNIT/ML ~~LOC~~ SOLN
0.0000 [IU] | Freq: Three times a day (TID) | SUBCUTANEOUS | Status: DC
  Administered 2018-07-28: 3 [IU] via SUBCUTANEOUS
  Administered 2018-07-28: 5 [IU] via SUBCUTANEOUS
  Administered 2018-07-29: 8 [IU] via SUBCUTANEOUS
  Administered 2018-07-29 (×2): 5 [IU] via SUBCUTANEOUS
  Administered 2018-07-30: 8 [IU] via SUBCUTANEOUS
  Administered 2018-07-30: 3 [IU] via SUBCUTANEOUS
  Administered 2018-07-30: 2 [IU] via SUBCUTANEOUS
  Administered 2018-07-31: 8 [IU] via SUBCUTANEOUS
  Administered 2018-07-31: 3 [IU] via SUBCUTANEOUS
  Administered 2018-07-31: 2 [IU] via SUBCUTANEOUS
  Administered 2018-08-01: 3 [IU] via SUBCUTANEOUS
  Administered 2018-08-01: 5 [IU] via SUBCUTANEOUS
  Administered 2018-08-01 – 2018-08-02 (×2): 3 [IU] via SUBCUTANEOUS
  Administered 2018-08-02: 5 [IU] via SUBCUTANEOUS
  Administered 2018-08-03 (×3): 2 [IU] via SUBCUTANEOUS

## 2018-07-28 MED ORDER — METOPROLOL TARTRATE 5 MG/5ML IV SOLN
5.0000 mg | Freq: Once | INTRAVENOUS | Status: AC
  Administered 2018-07-28: 5 mg via INTRAVENOUS
  Filled 2018-07-28: qty 5

## 2018-07-28 MED ORDER — DEXTROSE-NACL 5-0.45 % IV SOLN
INTRAVENOUS | Status: DC
  Administered 2018-07-28: 05:00:00 via INTRAVENOUS

## 2018-07-28 MED ORDER — INSULIN REGULAR(HUMAN) IN NACL 100-0.9 UT/100ML-% IV SOLN
INTRAVENOUS | Status: DC
  Administered 2018-07-28: 2.7 [IU]/h via INTRAVENOUS

## 2018-07-28 MED ORDER — ACETAMINOPHEN 325 MG PO TABS
650.0000 mg | ORAL_TABLET | ORAL | Status: DC | PRN
  Administered 2018-07-29 – 2018-07-31 (×5): 650 mg via ORAL
  Filled 2018-07-28 (×4): qty 2

## 2018-07-28 NOTE — Progress Notes (Signed)
Inpatient Diabetes Program Recommendations  AACE/ADA: New Consensus Statement on Inpatient Glycemic Control (2015)  Target Ranges:  Prepandial:   less than 140 mg/dL      Peak postprandial:   less than 180 mg/dL (1-2 hours)      Critically ill patients:  140 - 180 mg/dL   Lab Results  Component Value Date   GLUCAP 118 (H) 07/28/2018   HGBA1C 9.6 (H) 07/28/2018    Review of Glycemic Control  Diabetes history: DM2 Outpatient Diabetes medications: Levemir 22 units QAM, Novolog 10 units QD (between 12-2), metformin 1000 mg bid.  Current orders for Inpatient glycemic control:  Levemir 15 units Q24H, Novolog 0-15 units tidwc and hs Ran out of insulin 5 days ago, changed insurance.  HgbA1C - 9.6% - uncontrolled CO2 - 16 and AG - 10 Gap closed. CO2 still low at 16.  Inpatient Diabetes Program Recommendations:     Novolog 0-15 units Q4H until po intake increases Needs diabetes home meds to be adjusted prior to d/c. Ordered Living Well With Diabetes book. Will likely need more basal insulin at home.  Will speak with pt about his diabetes control at home and importance of checking blood sugars and making lifestyle changes.   Thank you. Lorenda Peck, RD, LDN, CDE Inpatient Diabetes Coordinator [phone]

## 2018-07-28 NOTE — Progress Notes (Signed)
Patient ID: Taylor Hughes, male   DOB: [birth year], 47 y.o.   MRN: [id number]  PROGRESS NOTE    Maxwell Lemen  OHY:[id number] DOB: 14-May-1971 DOA: 07/27/2018 PCP: Jilda Panda, MD   Brief Narrative:  47 year old male with history of diabetes mellitus type 2 on insulin presented with nausea, vomiting and chills.  He was unable to take his insulin over the last 5 days.  He was found to be in DKA and started on insulin drip along with intravenous fluids.   Assessment & Plan:   Principal Problem:   DKA, type 2 ([place]) Active Problems:   Leukocytosis   Hyperkalemia   Hyponatremia  DKA in a patient with history of diabetes mellitus type 2 uncontrolled -Currently on insulin drip.  Blood sugars improving.  Once anion gap is below 12, will switch to long-acting subcutaneous insulin and discontinue insulin drip 2 hours afterwards.  -diabetes coordinator and care management consult for insulin needs -Clear liquid diet and advance as tolerated -Hemoglobin A1c is 9.6.  Leukocytosis -Probably reactive.  Repeat a.m. labs.  Chest x-ray did not show any evidence of infiltrate.  UA was negative for infection.  Respiratory virus panel is pending  Hyperkalemia -Resolved  Hyponatremia  -continue IV fluids.  Monitor.  DVT prophylaxis: Lovenox Code Status: Full Family Communication: None at bedside Disposition Plan: Home probably tomorrow if continues to improve and tolerates oral food.  Consultants: None  Procedures: None  Antimicrobials: None   Subjective: Patient seen and examined at bedside.  He complains of some nausea and had one episode of vomiting this morning.  No abdominal pain.  No overnight fever.  Objective: Vitals:   07/28/18 0700 07/28/18 0800 07/28/18 1000 07/28/18 1041  BP:  (!) 168/98 140/90   Pulse: (!) 103 (!) 111 (!) 110 (!) 111  Resp: (!) 22 (!) 22  (!) 21  Temp:  98.4 F (36.9 C)    TempSrc:  Oral    SpO2: 99% 99% 97% 100%  Weight:      Height:         Intake/Output Summary (Last 24 hours) at 07/28/2018 1116 Last data filed at 07/28/2018 1036 Gross per 24 hour  Intake 2584.21 ml  Output 225 ml  Net 2359.21 ml   Filed Weights   07/27/18 2136  Weight: 111.1 kg    Examination:  General exam: Appears calm and comfortable  Respiratory system: Bilateral decreased breath sounds at bases, intermittent tachypnea Cardiovascular system: S1 & S2 heard, tachycardic Gastrointestinal system: Abdomen is nondistended, soft and nontender. Normal bowel sounds heard. Extremities: No cyanosis, clubbing, edema    Data Reviewed: I have personally reviewed following labs and imaging studies  CBC: Recent Labs  Lab 07/27/18 2216 07/28/18 0518  WBC 14.7* 14.3*  NEUTROABS 12.9*  --   HGB 13.3 12.6*  HCT 42.9 39.6  MCV 94.3 95.2  PLT 261 948   Basic Metabolic Panel: Recent Labs  Lab 07/27/18 2216 07/28/18 0012 07/28/18 0518 07/28/18 0843  NA 132* 133* 135 132*  K 5.2* 5.3* 4.2 3.9  CL 101 103 109 106  CO2 7* <7* 12* 16*  GLUCOSE 380* 379* 192* 164*  BUN 15 15 15 15   CREATININE 1.14 1.15 0.92 0.84  CALCIUM 8.9 8.9 8.8* 8.8*   GFR: Estimated Creatinine Clearance: 139.3 mL/min (by C-G formula based on SCr of 0.84 mg/dL). Liver Function Tests: Recent Labs  Lab 07/27/18 2216  AST 18  ALT 20  ALKPHOS 75  BILITOT 1.7*  PROT 8.3*  ALBUMIN 3.5   Recent Labs  Lab 07/27/18 2316  LIPASE 24   No results for input(s): AMMONIA in the last 168 hours. Coagulation Profile: No results for input(s): INR, PROTIME in the last 168 hours. Cardiac Enzymes: Recent Labs  Lab 07/27/18 2316  TROPONINI <0.03   BNP (last 3 results) No results for input(s): PROBNP in the last 8760 hours. HbA1C: Recent Labs    07/28/18 0518  HGBA1C 9.6*   CBG: Recent Labs  Lab 07/28/18 0613 07/28/18 0733 07/28/18 0829 07/28/18 0929 07/28/18 1034  GLUCAP 173* 151* 162* 156* 215*   Lipid Profile: No results for input(s): CHOL, HDL, LDLCALC,  TRIG, CHOLHDL, LDLDIRECT in the last 72 hours. Thyroid Function Tests: No results for input(s): TSH, T4TOTAL, FREET4, T3FREE, THYROIDAB in the last 72 hours. Anemia Panel: No results for input(s): VITAMINB12, FOLATE, FERRITIN, TIBC, IRON, RETICCTPCT in the last 72 hours. Sepsis Labs: Recent Labs  Lab 07/27/18 2226 07/28/18 0011  LATICACIDVEN 1.38 1.27    Recent Results (from the past 240 hour(s))  MRSA PCR Screening     Status: None   Collection Time: 07/28/18  2:06 AM  Result Value Ref Range Status   MRSA by PCR NEGATIVE NEGATIVE Final    Comment:        The GeneXpert MRSA Assay (FDA approved for NASAL specimens only), is one component of a comprehensive MRSA colonization surveillance program. It is not intended to diagnose MRSA infection nor to guide or monitor treatment for MRSA infections. Performed at [facility] [place], [place] [address] [place], [place] [address]          Radiology Studies: Dg Chest 2 View  Result Date: 07/27/2018 CLINICAL DATA:  Fever, chills and body aches EXAM: CHEST - 2 VIEW COMPARISON:  08/05/2006 FINDINGS: Normal heart size and mediastinal contours. Mild central vascular congestion without alveolar consolidation. No effusion or pneumothorax. 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 IMPRESSION: Mild central vascular congestion. Electronically Signed   By: Ashley Royalty M.D.   On: 07/27/2018 22:18        Scheduled Meds: . enoxaparin (LOVENOX) injection  40 mg Subcutaneous QHS  . [START ON 07/29/2018] Influenza vac split quadrivalent PF  0.5 mL Intramuscular Tomorrow-1000  . insulin aspart  0-15 Units Subcutaneous TID WC  . insulin aspart  0-5 Units Subcutaneous QHS  . insulin detemir  15 Units Subcutaneous Q24H  . [START ON 07/29/2018] pneumococcal 23 valent vaccine  0.5 mL Intramuscular Tomorrow-1000  . sodium chloride flush  3 mL Intravenous  Q12H   Continuous Infusions: . sodium chloride Stopped (07/28/18 0505)  . dextrose 5 % and 0.45% NaCl 100 mL/hr at 07/28/18 1036  . insulin 9.3 mL/hr at 07/28/18 1036     LOS: 0 days        Aline August, MD [facility] Pager [phone]  If 7PM-7AM, please contact night-coverage [email or web address] Password [email or web address] 07/28/2018, 11:16 AM

## 2018-07-28 NOTE — Progress Notes (Signed)
Bmet results called to [facility].

## 2018-07-28 NOTE — Progress Notes (Signed)
elink called pt. In afib RVR. Ekg complete should patient had converted to sinus tach. Oral temp 103.1. Rectal temp 103.6. tylenol Given. MD paged and new orders.

## 2018-07-28 NOTE — Progress Notes (Signed)
Insulin drip stopped at 1340. CBG 118.Patient states that he feels very tired and is not hungry. Only drank chicken broth and water for lunch .

## 2018-07-28 NOTE — Progress Notes (Signed)
[facility] Infection Prevention RN states droplet precautions can be discontinued. Respiratory panel only parainfluenza virus 4 detected. Droplet precautions not needed.

## 2018-07-28 NOTE — Progress Notes (Signed)
Following with pt due to consult for advance directive.  Pt states he does not wish to have advance directive.

## 2018-07-29 ENCOUNTER — Observation Stay (HOSPITAL_BASED_OUTPATIENT_CLINIC_OR_DEPARTMENT_OTHER): Payer: Self-pay

## 2018-07-29 ENCOUNTER — Observation Stay (HOSPITAL_COMMUNITY): Payer: Self-pay

## 2018-07-29 DIAGNOSIS — M7989 Other specified soft tissue disorders: Secondary | ICD-10-CM

## 2018-07-29 DIAGNOSIS — R509 Fever, unspecified: Secondary | ICD-10-CM

## 2018-07-29 DIAGNOSIS — R52 Pain, unspecified: Secondary | ICD-10-CM

## 2018-07-29 LAB — URINALYSIS, ROUTINE W REFLEX MICROSCOPIC
Bilirubin Urine: NEGATIVE
Glucose, UA: >=500 mg/dL — AB
Ketones, ur: 80 mg/dL — AB
Leukocytes, UA: NEGATIVE
Nitrite: NEGATIVE
Protein, ur: 100 mg/dL — AB
Specific Gravity, Urine: 1.032 — ABNORMAL HIGH (ref 1.005–1.030)
pH: 6 (ref 5.0–8.0)

## 2018-07-29 LAB — BASIC METABOLIC PANEL
Anion gap: 15 (ref 5–15)
BUN: 10 mg/dL (ref 6–20)
CO2: 16 mmol/L — ABNORMAL LOW (ref 22–32)
CREATININE: 0.76 mg/dL (ref 0.61–1.24)
Calcium: 8.8 mg/dL — ABNORMAL LOW (ref 8.9–10.3)
Chloride: 100 mmol/L (ref 98–111)
GFR calc non Af Amer: >60 mL/min (ref >60)
Glucose, Bld: 255 mg/dL — ABNORMAL HIGH (ref 70–99)
Potassium: 3.7 mmol/L (ref 3.5–5.1)
Sodium: 131 mmol/L — ABNORMAL LOW (ref 135–145)

## 2018-07-29 LAB — GLUCOSE, CAPILLARY
GLUCOSE-CAPILLARY: 188 mg/dL — AB (ref 70–99)
Glucose-Capillary: 264 mg/dL — ABNORMAL HIGH (ref 70–99)
Glucose-Capillary: 227 mg/dL — ABNORMAL HIGH (ref 70–99)
Glucose-Capillary: 221 mg/dL — ABNORMAL HIGH (ref 70–99)

## 2018-07-29 LAB — CBC WITH DIFFERENTIAL/PLATELET
Abs Immature Granulocytes: 0.08 10*3/uL — ABNORMAL HIGH (ref 0.00–0.07)
BASOS ABS: 0 10*3/uL (ref 0.0–0.1)
Basophils Relative: 0 %
Eosinophils Absolute: 0 10*3/uL (ref 0.0–0.5)
Eosinophils Relative: 0 %
HCT: 43 % (ref 39.0–52.0)
Hemoglobin: 13.7 g/dL (ref 13.0–17.0)
Immature Granulocytes: 1 %
Lymphocytes Relative: 8 %
Lymphs Abs: 1.1 10*3/uL (ref 0.7–4.0)
MCH: 28.8 pg (ref 26.0–34.0)
MCHC: 31.9 g/dL (ref 30.0–36.0)
MCV: 90.3 fL (ref 80.0–100.0)
Monocytes Absolute: 1.5 10*3/uL — ABNORMAL HIGH (ref 0.1–1.0)
Monocytes Relative: 10 %
Neutro Abs: 12.1 10*3/uL — ABNORMAL HIGH (ref 1.7–7.7)
Neutrophils Relative %: 81 %
Platelets: 268 10*3/uL (ref 150–400)
RBC: 4.76 MIL/uL (ref 4.22–5.81)
RDW: 14.4 % (ref 11.5–15.5)
WBC: 14.8 10*3/uL — ABNORMAL HIGH (ref 4.0–10.5)
nRBC: 0 % (ref 0.0–0.2)

## 2018-07-29 LAB — MAGNESIUM: Magnesium: 2 mg/dL (ref 1.7–2.4)

## 2018-07-29 MED ORDER — INSULIN ASPART 100 UNIT/ML ~~LOC~~ SOLN
4.0000 [IU] | Freq: Three times a day (TID) | SUBCUTANEOUS | Status: DC
  Administered 2018-07-29 – 2018-08-05 (×17): 4 [IU] via SUBCUTANEOUS

## 2018-07-29 MED ORDER — SODIUM CHLORIDE 0.9 % IV SOLN
INTRAVENOUS | Status: DC
  Administered 2018-07-29 (×3): via INTRAVENOUS

## 2018-07-29 MED ORDER — TRAMADOL HCL 50 MG PO TABS
50.0000 mg | ORAL_TABLET | Freq: Four times a day (QID) | ORAL | Status: DC | PRN
  Administered 2018-07-29 – 2018-08-05 (×8): 50 mg via ORAL
  Filled 2018-07-29 (×10): qty 1

## 2018-07-29 MED ORDER — METOPROLOL TARTRATE 5 MG/5ML IV SOLN
5.0000 mg | Freq: Once | INTRAVENOUS | Status: AC
  Administered 2018-07-29: 5 mg via INTRAVENOUS
  Filled 2018-07-29: qty 5

## 2018-07-29 MED ORDER — METOPROLOL TARTRATE 5 MG/5ML IV SOLN
2.5000 mg | Freq: Four times a day (QID) | INTRAVENOUS | Status: DC | PRN
  Administered 2018-07-29 – 2018-08-04 (×6): 2.5 mg via INTRAVENOUS
  Filled 2018-07-29 (×8): qty 5

## 2018-07-29 MED ORDER — INFLUENZA VAC SPLIT QUAD 0.5 ML IM SUSY: 0.5000 mL | PREFILLED_SYRINGE | INTRAMUSCULAR | Status: DC | PRN

## 2018-07-29 MED ORDER — INSULIN DETEMIR 100 UNIT/ML ~~LOC~~ SOLN
20.0000 [IU] | Freq: Every day | SUBCUTANEOUS | Status: DC
  Administered 2018-07-29 – 2018-07-31 (×3): 20 [IU] via SUBCUTANEOUS
  Filled 2018-07-29 (×3): qty 0.2

## 2018-07-29 MED ORDER — PNEUMOCOCCAL VAC POLYVALENT 25 MCG/0.5ML IJ INJ: 0.5000 mL | INJECTION | INTRAMUSCULAR | Status: DC | PRN

## 2018-07-29 MED ORDER — SODIUM CHLORIDE 0.9 % IV BOLUS
1000.0000 mL | Freq: Once | INTRAVENOUS | Status: AC
  Administered 2018-07-29: 1000 mL via INTRAVENOUS

## 2018-07-29 NOTE — Progress Notes (Signed)
Patient ID: Taylor Hughes, male   DOB: [birth year], 47 y.o.   MRN: [id number]  PROGRESS NOTE    Taylor Hughes  CVE:[id number] DOB: 03-09-1971 DOA: 07/27/2018 PCP: Jilda Panda, MD   Brief Narrative:  47 year old male with history of diabetes mellitus type 2 on insulin presented with nausea, vomiting and chills.  He was unable to take his insulin over the last 5 days.  He was found to be in DKA and started on insulin drip along with intravenous fluids.   Assessment & Plan:   Principal Problem:   DKA, type 2 ([place]) Active Problems:   Leukocytosis   Hyperkalemia   Hyponatremia  DKA in a patient with history of diabetes mellitus type 2 uncontrolled -Initially started on insulin drip.  After anion gap closed, switched to long-acting insulin and insulin drip was subsequently discontinued.   -Blood sugars improving but still on the higher side.  Increase Levemir to 20 units subcutaneous daily.  Continue Accu-Cheks with sliding scale coverage.  Diabetes coordinator following.  -care management consult for insulin needs -Feels that his appetite is better this morning. -Hemoglobin A1c is 9.6.  Fever with tachycardia -Probably viral fever.  Respiratory virus panel was positive for parainfluenza 4 -Patient is having sinus tachycardia.  Monitor on telemetry.  Will restart normal saline at 125 cc an hour.  Cultures have been negative so far.  Leukocytosis -Probably reactive.  Persistent.  Repeat a.m. labs.  Chest x-ray did not show any evidence of infiltrate.  UA was negative for infection.  Respiratory virus panel was positive for parainfluenza 4  Hyperkalemia -Resolved  Hyponatremia  -IV fluids as above.  Repeat a.m. labs  DVT prophylaxis: Lovenox Code Status: Full Family Communication: None at bedside Disposition Plan: Home probably tomorrow if continues to improve tolerates food and is afebrile.  Consultants: None  Procedures: None  Antimicrobials: None   Subjective: Patient seen  and examined at bedside.  Patient feels better, states that his strength is better and appetite is better.  Currently not nauseous.  No worsening abdominal pain.  Had fever yesterday. Objective: Vitals:   07/29/18 0200 07/29/18 0357 07/29/18 0750 07/29/18 0800  BP: 111/71   (!) 149/106  Pulse:      Resp: 18   (!) 23  Temp:  98.2 F (36.8 C) 98.6 F (37 C)   TempSrc:  Oral Oral   SpO2:    98%  Weight:      Height:        Intake/Output Summary (Last 24 hours) at 07/29/2018 0925 Last data filed at 07/29/2018 0900 Gross per 24 hour  Intake 1115.52 ml  Output 890 ml  Net 225.52 ml   Filed Weights   07/27/18 2136  Weight: 111.1 kg    Examination:  General exam: Appears calm and comfortable, no distress Respiratory system: Bilateral decreased breath sounds at bases, tachypneic intermittently Cardiovascular system: S1 & S2 heard, rate controlled Gastrointestinal system: Abdomen is nondistended, soft and nontender. Normal bowel sounds heard. Extremities: No cyanosis; edema    Data Reviewed: I have personally reviewed following labs and imaging studies  CBC: Recent Labs  Lab 07/27/18 2216 07/28/18 0518 07/29/18 0328  WBC 14.7* 14.3* 14.8*  NEUTROABS 12.9*  --  12.1*  HGB 13.3 12.6* 13.7  HCT 42.9 39.6 43.0  MCV 94.3 95.2 90.3  PLT 261 252 025   Basic Metabolic Panel: Recent Labs  Lab 07/27/18 2216 07/28/18 0012 07/28/18 0518 07/28/18 0843 07/29/18 0328  NA 132* 133* 135 132* 131*  K 5.2* 5.3* 4.2 3.9 3.7  CL 101 103 109 106 100  CO2 7* <7* 12* 16* 16*  GLUCOSE 380* 379* 192* 164* 255*  BUN 15 15 15 15 10   CREATININE 1.14 1.15 0.92 0.84 0.76  CALCIUM 8.9 8.9 8.8* 8.8* 8.8*  MG  --   --   --   --  2.0   GFR: Estimated Creatinine Clearance: 146.2 mL/min (by C-G formula based on SCr of 0.76 mg/dL). Liver Function Tests: Recent Labs  Lab 07/27/18 2216  AST 18  ALT 20  ALKPHOS 75  BILITOT 1.7*  PROT 8.3*  ALBUMIN 3.5   Recent Labs  Lab  07/27/18 2316  LIPASE 24   No results for input(s): AMMONIA in the last 168 hours. Coagulation Profile: No results for input(s): INR, PROTIME in the last 168 hours. Cardiac Enzymes: Recent Labs  Lab 07/27/18 2316  TROPONINI <0.03   BNP (last 3 results) No results for input(s): PROBNP in the last 8760 hours. HbA1C: Recent Labs    07/28/18 0518  HGBA1C 9.6*   CBG: Recent Labs  Lab 07/28/18 1235 07/28/18 1337 07/28/18 1747 07/28/18 2149 07/29/18 0806  GLUCAP 116* 118* 219* 208* 264*   Lipid Profile: No results for input(s): CHOL, HDL, LDLCALC, TRIG, CHOLHDL, LDLDIRECT in the last 72 hours. Thyroid Function Tests: No results for input(s): TSH, T4TOTAL, FREET4, T3FREE, THYROIDAB in the last 72 hours. Anemia Panel: No results for input(s): VITAMINB12, FOLATE, FERRITIN, TIBC, IRON, RETICCTPCT in the last 72 hours. Sepsis Labs: Recent Labs  Lab 07/27/18 2226 07/28/18 0011  LATICACIDVEN 1.38 1.27    Recent Results (from the past 240 hour(s))  MRSA PCR Screening     Status: None   Collection Time: 07/28/18  2:06 AM  Result Value Ref Range Status   MRSA by PCR NEGATIVE NEGATIVE Final    Comment:        The GeneXpert MRSA Assay (FDA approved for NASAL specimens only), is one component of a comprehensive MRSA colonization surveillance program. It is not intended to diagnose MRSA infection nor to guide or monitor treatment for MRSA infections. Performed at [facility], [place] [address] [place], [place] [address]   Respiratory Panel by PCR     Status: Abnormal   Collection Time: 07/28/18  6:38 AM  Result Value Ref Range Status   Adenovirus NOT DETECTED NOT DETECTED Final   Coronavirus 229E NOT DETECTED NOT DETECTED Final   Coronavirus HKU1 NOT DETECTED NOT DETECTED Final   Coronavirus NL63 NOT DETECTED NOT DETECTED Final   Coronavirus OC43 NOT DETECTED NOT DETECTED Final   Metapneumovirus NOT DETECTED NOT DETECTED Final   Rhinovirus /  Enterovirus NOT DETECTED NOT DETECTED Final   Influenza A NOT DETECTED NOT DETECTED Final   Influenza B NOT DETECTED NOT DETECTED Final   Parainfluenza Virus 1 NOT DETECTED NOT DETECTED Final   Parainfluenza Virus 2 NOT DETECTED NOT DETECTED Final   Parainfluenza Virus 3 NOT DETECTED NOT DETECTED Final   Parainfluenza Virus 4 DETECTED (A) NOT DETECTED Final   Respiratory Syncytial Virus NOT DETECTED NOT DETECTED Final   Bordetella pertussis NOT DETECTED NOT DETECTED Final   Chlamydophila pneumoniae NOT DETECTED NOT DETECTED Final   Mycoplasma pneumoniae NOT DETECTED NOT DETECTED Final    Comment: Performed at [facility], [place] [address], [place], [place] [address]  Culture, blood (routine x 2)     Status: None (Preliminary result)   Collection Time: 07/28/18  7:36 PM  Result Value Ref Range Status   Specimen Description   Final    BLOOD LEFT ANTECUBITAL Performed at [facility] [address] [place], [place] [address]    Special Requests   Final    BOTTLES DRAWN AEROBIC AND ANAEROBIC Blood Culture adequate volume Performed at [facility] [address] [place], [place] [address]    Culture   Final    NO GROWTH < 12 HOURS Performed at [facility] [address] [place], [place] [address]    Report Status PENDING  Incomplete  Culture, blood (routine x 2)     Status: None (Preliminary result)   Collection Time: 07/28/18  7:42 PM  Result Value Ref Range Status   Specimen Description   Final    BLOOD RIGHT HAND Performed at [facility] [address], [place], [place] [address]    Special Requests   Final    BOTTLES DRAWN AEROBIC AND ANAEROBIC Blood Culture adequate volume Performed at [facility] [address], [place], [place] [address]    Culture   Final    NO GROWTH < 12 HOURS Performed at [facility] [address] [place], [place] [address]    Report Status  PENDING  Incomplete         Radiology Studies: Dg Chest 2 View  Result Date: 07/27/2018 CLINICAL DATA:  Fever, chills and body aches EXAM: CHEST - 2 VIEW COMPARISON:  08/05/2006 FINDINGS: Normal heart size and mediastinal contours. Mild central vascular congestion without alveolar consolidation. No effusion or pneumothorax. 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 IMPRESSION: Mild central vascular congestion. Electronically Signed   By: Ashley Royalty M.D.   On: 07/27/2018 22:18        Scheduled Meds: . enoxaparin (LOVENOX) injection  40 mg Subcutaneous QHS  . Influenza vac split quadrivalent PF  0.5 mL Intramuscular Tomorrow-1000  . insulin aspart  0-15 Units Subcutaneous TID WC  . insulin aspart  0-5 Units Subcutaneous QHS  . insulin detemir  20 Units Subcutaneous Daily  . pneumococcal 23 valent vaccine  0.5 mL Intramuscular Tomorrow-1000  . sodium chloride flush  3 mL Intravenous Q12H   Continuous Infusions: . sodium chloride 125 mL/hr at 07/29/18 0845     LOS: 0 days        Aline August, MD [facility] Pager [phone]  If 7PM-7AM, please contact night-coverage [email or web address] Password [email or web address] 07/29/2018, 9:25 AM

## 2018-07-29 NOTE — Progress Notes (Signed)
Inpatient Diabetes Program Recommendations  AACE/ADA: New Consensus Statement on Inpatient Glycemic Control (2015)  Target Ranges:  Prepandial:   less than 140 mg/dL      Peak postprandial:   less than 180 mg/dL (1-2 hours)      Critically ill patients:  140 - 180 mg/dL   Lab Results  Component Value Date   GLUCAP 227 (H) 07/29/2018   HGBA1C 9.6 (H) 07/28/2018    Review of Glycemic Control  Spoke with pt regarding his HgbA1C of 9.6%. Pt states he ran out of insulin 5 days ago - has Medicare but cannot afford insulin until he gets new orange card, which should be mailed in a week or two. Pt will need affordable insulin until insurance card is received. Pt states he also needs a glucose meter. Long discussion about improving HgbA1C, monitoring blood sugars at least 3x/day and f/u with PCP. Pt states he knows he "needs to get back on track."   Inpatient Diabetes Program Recommendations:     Add Novolog 4 units tidwc for meal coverage insulin  For discharge:  Novolin 70/30 20 units bid Novolin R - s/s 0-15 units QID Glucose meter and supplies Will need syringes.  Pt to f/u with PCP within 1-2 weeks with logbook.  Will follow while inpatient.  Thank you. Lorenda Peck, RD, LDN, CDE Inpatient Diabetes Coordinator [phone]

## 2018-07-29 NOTE — Progress Notes (Signed)
Bilateral lower extremity venous duplex completed. Refer to "CV Proc" under chart review to view preliminary results.  07/29/2018 3:15 PM Maudry Mayhew, MHA, RVT, RDCS, RDMS

## 2018-07-29 NOTE — Progress Notes (Signed)
Attempted echo.  Elevated HR

## 2018-07-30 ENCOUNTER — Encounter (HOSPITAL_COMMUNITY)
Admission: EM | Disposition: A | Discharge status: Home / Self Care | Payer: Self-pay | Source: Home / Self Care | Attending: Internal Medicine

## 2018-07-30 ENCOUNTER — Observation Stay (HOSPITAL_COMMUNITY): Payer: Self-pay

## 2018-07-30 ENCOUNTER — Inpatient Hospital Stay (HOSPITAL_COMMUNITY): Payer: Self-pay | Admitting: Certified Registered Nurse Anesthetist

## 2018-07-30 ENCOUNTER — Encounter (HOSPITAL_COMMUNITY): Payer: Self-pay | Admitting: Anesthesiology

## 2018-07-30 DIAGNOSIS — E11628 Type 2 diabetes mellitus with other skin complications: Secondary | ICD-10-CM

## 2018-07-30 DIAGNOSIS — L089 Local infection of the skin and subcutaneous tissue, unspecified: Secondary | ICD-10-CM

## 2018-07-30 DIAGNOSIS — R0602 Shortness of breath: Secondary | ICD-10-CM

## 2018-07-30 DIAGNOSIS — E1110 Type 2 diabetes mellitus with ketoacidosis without coma: Secondary | ICD-10-CM | POA: Diagnosis present

## 2018-07-30 DIAGNOSIS — L03116 Cellulitis of left lower limb: Secondary | ICD-10-CM

## 2018-07-30 HISTORY — PX: I & D EXTREMITY: SHX5045

## 2018-07-30 LAB — CBC WITH DIFFERENTIAL/PLATELET
ABS IMMATURE GRANULOCYTES: 0.06 10*3/uL (ref 0.00–0.07)
BASOS PCT: 0 %
Basophils Absolute: 0 10*3/uL (ref 0.0–0.1)
Eosinophils Absolute: 0 10*3/uL (ref 0.0–0.5)
Eosinophils Relative: 0 %
HCT: 38.9 % — ABNORMAL LOW (ref 39.0–52.0)
Hemoglobin: 12.8 g/dL — ABNORMAL LOW (ref 13.0–17.0)
IMMATURE GRANULOCYTES: 1 %
Lymphocytes Relative: 12 %
Lymphs Abs: 1.2 10*3/uL (ref 0.7–4.0)
MCH: 29.2 pg (ref 26.0–34.0)
MCHC: 32.9 g/dL (ref 30.0–36.0)
MCV: 88.8 fL (ref 80.0–100.0)
MONOS PCT: 9 %
Monocytes Absolute: 0.9 10*3/uL (ref 0.1–1.0)
NEUTROS ABS: 8.1 10*3/uL — AB (ref 1.7–7.7)
NEUTROS PCT: 78 %
Platelets: 288 10*3/uL (ref 150–400)
RBC: 4.38 MIL/uL (ref 4.22–5.81)
RDW: 14.3 % (ref 11.5–15.5)
WBC: 10.2 10*3/uL (ref 4.0–10.5)
nRBC: 0 % (ref 0.0–0.2)

## 2018-07-30 LAB — ECHOCARDIOGRAM COMPLETE
Height: 71 in
WEIGHTICAEL: 3920 [oz_av]

## 2018-07-30 LAB — GLUCOSE, CAPILLARY
Glucose-Capillary: 284 mg/dL — ABNORMAL HIGH (ref 70–99)
Glucose-Capillary: 154 mg/dL — ABNORMAL HIGH (ref 70–99)
Glucose-Capillary: 145 mg/dL — ABNORMAL HIGH (ref 70–99)
Glucose-Capillary: 102 mg/dL — ABNORMAL HIGH (ref 70–99)

## 2018-07-30 LAB — MAGNESIUM: Magnesium: 2 mg/dL (ref 1.7–2.4)

## 2018-07-30 LAB — URINE CULTURE: Culture: NO GROWTH

## 2018-07-30 LAB — BASIC METABOLIC PANEL
Anion gap: 9 (ref 5–15)
BUN: 8 mg/dL (ref 6–20)
CO2: 20 mmol/L — ABNORMAL LOW (ref 22–32)
Calcium: 8 mg/dL — ABNORMAL LOW (ref 8.9–10.3)
Chloride: 105 mmol/L (ref 98–111)
Creatinine, Ser: 0.65 mg/dL (ref 0.61–1.24)
GFR calc Af Amer: >60 mL/min (ref >60)
GFR calc non Af Amer: >60 mL/min (ref >60)
Glucose, Bld: 159 mg/dL — ABNORMAL HIGH (ref 70–99)
Potassium: 3.7 mmol/L (ref 3.5–5.1)
Sodium: 134 mmol/L — ABNORMAL LOW (ref 135–145)

## 2018-07-30 SURGERY — IRRIGATION AND DEBRIDEMENT EXTREMITY
Anesthesia: General | Site: Foot | Laterality: Left

## 2018-07-30 MED ORDER — MEPERIDINE HCL 50 MG/ML IJ SOLN: 6.2500 mg | INTRAMUSCULAR | Status: DC | PRN

## 2018-07-30 MED ORDER — VANCOMYCIN HCL 10 G IV SOLR
2500.0000 mg | Freq: Once | INTRAVENOUS | Status: AC
  Administered 2018-07-30: 2500 mg via INTRAVENOUS
  Filled 2018-07-30: qty 2000

## 2018-07-30 MED ORDER — KETOROLAC TROMETHAMINE 30 MG/ML IJ SOLN: 30.0000 mg | Freq: Once | INTRAMUSCULAR | Status: DC | PRN

## 2018-07-30 MED ORDER — ESMOLOL HCL 100 MG/10ML IV SOLN
INTRAVENOUS | Status: DC | PRN
  Administered 2018-07-30: 100 mg via INTRAVENOUS

## 2018-07-30 MED ORDER — PROMETHAZINE HCL 25 MG/ML IJ SOLN: 6.2500 mg | INTRAMUSCULAR | Status: DC | PRN

## 2018-07-30 MED ORDER — HYDROMORPHONE HCL 1 MG/ML IJ SOLN: 0.2500 mg | INTRAMUSCULAR | Status: DC | PRN

## 2018-07-30 MED ORDER — PHENYLEPHRINE 40 MCG/ML (10ML) SYRINGE FOR IV PUSH (FOR BLOOD PRESSURE SUPPORT)
PREFILLED_SYRINGE | INTRAVENOUS | Status: AC
  Filled 2018-07-30: qty 10

## 2018-07-30 MED ORDER — METRONIDAZOLE IN NACL 5-0.79 MG/ML-% IV SOLN
500.0000 mg | Freq: Three times a day (TID) | INTRAVENOUS | Status: DC
  Administered 2018-07-30 – 2018-08-01 (×6): 500 mg via INTRAVENOUS
  Filled 2018-07-30 (×6): qty 100

## 2018-07-30 MED ORDER — MIDAZOLAM HCL 2 MG/2ML IJ SOLN
INTRAMUSCULAR | Status: DC | PRN
  Administered 2018-07-30: 2 mg via INTRAVENOUS

## 2018-07-30 MED ORDER — FENTANYL CITRATE (PF) 100 MCG/2ML IJ SOLN
INTRAMUSCULAR | Status: AC
  Filled 2018-07-30: qty 2

## 2018-07-30 MED ORDER — FENTANYL CITRATE (PF) 100 MCG/2ML IJ SOLN
INTRAMUSCULAR | Status: DC | PRN
  Administered 2018-07-30 (×4): 25 ug via INTRAVENOUS

## 2018-07-30 MED ORDER — SODIUM CHLORIDE 0.9 % IV SOLN
INTRAVENOUS | Status: DC | PRN
  Administered 2018-07-30 – 2018-08-04 (×3): 250 mL via INTRAVENOUS

## 2018-07-30 MED ORDER — SODIUM CHLORIDE 0.9 % IR SOLN
Status: DC | PRN
  Administered 2018-07-30: 4000 mL

## 2018-07-30 MED ORDER — LIDOCAINE 2% (20 MG/ML) 5 ML SYRINGE
INTRAMUSCULAR | Status: AC
  Filled 2018-07-30: qty 5

## 2018-07-30 MED ORDER — LACTATED RINGERS IV BOLUS
1000.0000 mL | Freq: Once | INTRAVENOUS | Status: AC
  Administered 2018-07-30: 1000 mL via INTRAVENOUS

## 2018-07-30 MED ORDER — PHENYLEPHRINE 40 MCG/ML (10ML) SYRINGE FOR IV PUSH (FOR BLOOD PRESSURE SUPPORT)
PREFILLED_SYRINGE | INTRAVENOUS | Status: DC | PRN
  Administered 2018-07-30: 120 ug via INTRAVENOUS
  Administered 2018-07-30: 100 ug via INTRAVENOUS
  Administered 2018-07-30: 120 ug via INTRAVENOUS
  Administered 2018-07-30: 200 ug via INTRAVENOUS

## 2018-07-30 MED ORDER — VANCOMYCIN HCL IN DEXTROSE 1-5 GM/200ML-% IV SOLN
1000.0000 mg | Freq: Three times a day (TID) | INTRAVENOUS | Status: DC
  Administered 2018-07-30 – 2018-08-02 (×8): 1000 mg via INTRAVENOUS
  Filled 2018-07-30 (×7): qty 200

## 2018-07-30 MED ORDER — ACETAMINOPHEN 10 MG/ML IV SOLN
1000.0000 mg | Freq: Once | INTRAVENOUS | Status: AC
  Administered 2018-07-30: 1000 mg via INTRAVENOUS

## 2018-07-30 MED ORDER — PHENYLEPHRINE HCL 10 MG/ML IJ SOLN
INTRAMUSCULAR | Status: AC
  Filled 2018-07-30: qty 1

## 2018-07-30 MED ORDER — PROPOFOL 10 MG/ML IV BOLUS
INTRAVENOUS | Status: AC
  Filled 2018-07-30: qty 20

## 2018-07-30 MED ORDER — ONDANSETRON HCL 4 MG/2ML IJ SOLN
INTRAMUSCULAR | Status: AC
  Filled 2018-07-30: qty 2

## 2018-07-30 MED ORDER — SODIUM CHLORIDE 0.9 % IV SOLN
2.0000 g | INTRAVENOUS | Status: DC
  Administered 2018-07-30 – 2018-08-01 (×3): 2 g via INTRAVENOUS
  Filled 2018-07-30 (×3): qty 2

## 2018-07-30 MED ORDER — ACETAMINOPHEN 10 MG/ML IV SOLN
INTRAVENOUS | Status: AC
  Filled 2018-07-30: qty 100

## 2018-07-30 MED ORDER — PROPOFOL 10 MG/ML IV BOLUS
INTRAVENOUS | Status: DC | PRN
  Administered 2018-07-30: 180 mg via INTRAVENOUS

## 2018-07-30 MED ORDER — FUROSEMIDE 10 MG/ML IJ SOLN
60.0000 mg | Freq: Once | INTRAMUSCULAR | Status: AC
  Administered 2018-07-30: 60 mg via INTRAVENOUS
  Filled 2018-07-30: qty 6

## 2018-07-30 MED ORDER — BUPIVACAINE HCL (PF) 0.5 % IJ SOLN
INTRAMUSCULAR | Status: AC
  Filled 2018-07-30: qty 30

## 2018-07-30 MED ORDER — ONDANSETRON HCL 4 MG/2ML IJ SOLN
INTRAMUSCULAR | Status: DC | PRN
  Administered 2018-07-30: 4 mg via INTRAVENOUS

## 2018-07-30 MED ORDER — LIDOCAINE 2% (20 MG/ML) 5 ML SYRINGE
INTRAMUSCULAR | Status: DC | PRN
  Administered 2018-07-30: 50 mg via INTRAVENOUS

## 2018-07-30 MED ORDER — MIDAZOLAM HCL 2 MG/2ML IJ SOLN
INTRAMUSCULAR | Status: AC
  Filled 2018-07-30: qty 2

## 2018-07-30 MED ORDER — LACTATED RINGERS IV SOLN
INTRAVENOUS | Status: DC
  Administered 2018-07-30 – 2018-07-31 (×3): via INTRAVENOUS

## 2018-07-30 MED ORDER — LACTATED RINGERS IV BOLUS
500.0000 mL | Freq: Once | INTRAVENOUS | Status: AC
  Administered 2018-07-30: 500 mL via INTRAVENOUS

## 2018-07-30 MED ORDER — METOPROLOL TARTRATE 5 MG/5ML IV SOLN
5.0000 mg | Freq: Once | INTRAVENOUS | Status: AC
  Administered 2018-07-30: 5 mg via INTRAVENOUS
  Filled 2018-07-30: qty 5

## 2018-07-30 SURGICAL SUPPLY — 28 items
BNDG COHESIVE 4X5 TAN STRL (GAUZE/BANDAGES/DRESSINGS) ×2 IMPLANT
BNDG ESMARK 4X9 LF (GAUZE/BANDAGES/DRESSINGS) ×2 IMPLANT
BNDG GAUZE ELAST 4 BULKY (GAUZE/BANDAGES/DRESSINGS) ×2 IMPLANT
COVER SURGICAL LIGHT HANDLE (MISCELLANEOUS) ×2 IMPLANT
COVER WAND RF STERILE (DRAPES) IMPLANT
CUFF TOURN SGL QUICK 34 (TOURNIQUET CUFF) ×1
CUFF TRNQT CYL 34X4X40X1 (TOURNIQUET CUFF) ×1 IMPLANT
DRAPE U-SHAPE 47X51 STRL (DRAPES) ×2 IMPLANT
ELECT PENCIL ROCKER SW 15FT (MISCELLANEOUS) ×2 IMPLANT
ELECT REM PT RETURN 15FT ADLT (MISCELLANEOUS) ×2 IMPLANT
GAUZE SPONGE 4X4 12PLY STRL (GAUZE/BANDAGES/DRESSINGS) ×2 IMPLANT
GAUZE XEROFORM 1X8 LF (GAUZE/BANDAGES/DRESSINGS) ×2 IMPLANT
GLOVE BIO SURGEON STRL SZ7.5 (GLOVE) ×2 IMPLANT
GLOVE BIOGEL PI IND STRL 8 (GLOVE) ×1 IMPLANT
GLOVE BIOGEL PI INDICATOR 8 (GLOVE) ×1
GOWN STRL REUS W/TWL XL LVL3 (GOWN DISPOSABLE) ×2 IMPLANT
KIT BASIN OR (CUSTOM PROCEDURE TRAY) ×2 IMPLANT
PACK ORTHO EXTREMITY (CUSTOM PROCEDURE TRAY) ×2 IMPLANT
PROTECTOR NERVE ULNAR (MISCELLANEOUS) ×2 IMPLANT
SOL PREP PROV IODINE SCRUB 4OZ (MISCELLANEOUS) ×2 IMPLANT
SUT ETHILON 2 0 PS N (SUTURE) ×4 IMPLANT
SWAB COLLECTION DEVICE MRSA (MISCELLANEOUS) ×2 IMPLANT
SWAB CULTURE ESWAB REG 1ML (MISCELLANEOUS) ×2 IMPLANT
SYR BULB IRRIGATION 50ML (SYRINGE) ×2 IMPLANT
SYR CONTROL 10ML LL (SYRINGE) ×2 IMPLANT
TOWEL OR 17X26 10 PK STRL BLUE (TOWEL DISPOSABLE) ×4 IMPLANT
TRAY PREP A LATEX SAFE STRL (SET/KITS/TRAYS/PACK) ×2 IMPLANT
YANKAUER SUCT BULB TIP NO VENT (SUCTIONS) ×2 IMPLANT

## 2018-07-30 NOTE — Progress Notes (Signed)
Inpatient Diabetes Program Recommendations  AACE/ADA: New Consensus Statement on Inpatient Glycemic Control (2015)  Target Ranges:  Prepandial:   less than 140 mg/dL      Peak postprandial:   less than 180 mg/dL (1-2 hours)      Critically ill patients:  140 - 180 mg/dL   Lab Results  Component Value Date   GLUCAP 154 (H) 07/30/2018   HGBA1C 9.6 (H) 07/28/2018    Review of Glycemic Control  FBS > 180 mg/dL this am.  Inpatient Diabetes Program Recommendations:    Increase Levemir to 22 units QD Increase Novolog to 6 units tidwc  Will need Novolin 70/30 at discharge since insurance card has not been sent - per pt.   Continue to follow.  Thank you. Lorenda Peck, RD, LDN, CDE Inpatient Diabetes Coordinator [phone]

## 2018-07-30 NOTE — Anesthesia Postprocedure Evaluation (Signed)
Anesthesia Post Note  Patient: Taylor Hughes  Procedure(s) Performed: IRRIGATION AND DEBRIDEMENT LEFT FOOT WITH POSSIBLE AMPUTATION OF FIFTH TOE (Left Foot)     Patient location during evaluation: PACU Anesthesia Type: General Level of consciousness: awake Pain management: pain level controlled Vital Signs Assessment: post-procedure vital signs reviewed and stable Respiratory status: spontaneous breathing Cardiovascular status: tachycardic Postop Assessment: no apparent nausea or vomiting Anesthetic complications: no    Last Vitals:  Vitals:   07/30/18 1742 07/30/18 1930  BP: 137/84 127/86  Pulse:  (!) 138  Resp: (!) 22 (!) 25  Temp:  (!) 38.6 C  SpO2: 97% 100%    Last Pain:  Vitals:   07/30/18 1930  TempSrc:   PainSc: 0-No pain   Pain Goal: Patients Stated Pain Goal: 1 (07/28/18 1041)               Huston Foley

## 2018-07-30 NOTE — Consult Note (Signed)
[facility] Nurse wound consult note Reason for Consult: left foot wound Patient reports about a week ago noting some blistering of the left lateral foot and some increasing pressure. Blisters ruptured "during the night" Reports he worn some "socks I don't normally wear" and felt that contributed to this wound. 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  Will need compression long term to manage this. Edema is > in the LLE.  Dressing procedure/placement/frequency: Topical silver for now for antimicrobial and absorbency of exudate Cover with foam Xray pending. Most likely will need orthopedic consultation.   Discussed POC with patient and bedside nurse.  Re consult if needed, will not follow at this time. Thanks  Loree Shehata [employer], RN,CWOCN, CNS, [place] [phone])

## 2018-07-30 NOTE — Anesthesia Preprocedure Evaluation (Addendum)
Anesthesia Evaluation  Patient identified by MRN, date of birth, ID band Patient awake    Reviewed: Allergy & Precautions, NPO status , Patient's Chart, lab work & pertinent test results  Airway Mallampati: II       Dental no notable dental hx. (+) Teeth Intact   Pulmonary neg pulmonary ROS,    Pulmonary exam normal breath sounds clear to auscultation       Cardiovascular negative cardio ROS Normal cardiovascular exam Rhythm:Regular Rate:Normal     Neuro/Psych negative neurological ROS  negative psych ROS   GI/Hepatic negative GI ROS, Neg liver ROS,   Endo/Other  diabetes, Insulin Dependent, Oral Hypoglycemic Agents  Renal/GU negative Renal ROS  negative genitourinary   Musculoskeletal   Abdominal (+) + obese,   Peds  Hematology  (+) anemia ,   Anesthesia Other Findings   Reproductive/Obstetrics                            Anesthesia Physical Anesthesia Plan  ASA: III  Anesthesia Plan: General   Post-op Pain Management:    Induction: Intravenous  PONV Risk Score and Plan: 1  Airway Management Planned: LMA  Additional Equipment:   Intra-op Plan:   Post-operative Plan: Extubation in OR  Informed Consent: I have reviewed the patients History and Physical, chart, labs and discussed the procedure including the risks, benefits and alternatives for the proposed anesthesia with the patient or authorized representative who has indicated his/her understanding and acceptance.     Plan Discussed with: CRNA  Anesthesia Plan Comments:        Anesthesia Quick Evaluation

## 2018-07-30 NOTE — Progress Notes (Signed)
Patient ID: Tell Rozelle, male   DOB: Jul 06, 1971, 47 y.o.   MRN: [id number]  PROGRESS NOTE    Ridgely Anastacio  DXA:[id number] DOB: 1971-06-19 DOA: 07/27/2018 PCP: Jilda Panda, MD   Brief Narrative:  47 year old male with history of diabetes mellitus type 2 on insulin presented with nausea, vomiting and chills.  He was unable to take his insulin over the last 5 days.  He was found to be in DKA and started on insulin drip along with intravenous fluids.   Assessment & Plan:   Principal Problem:   DKA, type 2 ([place]) Active Problems:   Leukocytosis   Hyperkalemia   Hyponatremia   DKA (diabetic ketoacidoses) ([place])  DKA in a patient with history of diabetes mellitus type 2 uncontrolled -Initially started on insulin drip.  After anion gap closed, switched to long-acting insulin and insulin drip was subsequently discontinued.   -Blood sugars improving but still on the higher side.  Increase Levemir to 25 units subcutaneous daily.  Increase NovoLog with meals to 6 units 3 times a day.  Continue Accu-Cheks with sliding scale coverage.  Diabetes coordinator following.  -care management consult for insulin needs -Feels that his appetite is better this morning. -Hemoglobin A1c is 9.6.  Left foot cellulitis/diabetic foot infection with ulcer -Left foot x-ray shows soft tissue swelling along with subcutaneous gas concerning for infection, including necrotizing soft tissue infection. -Dr. Blackman/orthopedics consulted.  Will keep n.p.o.  Start Rocephin, Flagyl and vancomycin. -Still febrile intermittently with sinus tachycardia.  Discontinue IV fluids as patient complains of bilateral legs swelling.  Lower extremity duplex was negative for DVT in bilateral lower extremity.  Leukocytosis -Resolved.  Chest x-ray was negative for any indications of infiltrate.  UA was negative for UTI.  Respiratory virus PCR was positive for parainfluenza 4.  Hyperkalemia -Resolved  Hyponatremia  -Treated with IV  fluids.  Improving  DVT prophylaxis: Lovenox Code Status: Full Family Communication: None at bedside Disposition Plan: Depends on further evaluation by orthopedics  consultants: Consulted orthopedics  Procedures: None  Antimicrobials: Started Rocephin, Flagyl and vancomycin on 07/30/2018   Subjective: Patient seen and examined at bedside.  Patient feels slightly better, appetite is improving.  Complains of left foot swelling with some drainage.  Still having fevers.  Objective: Vitals:   07/29/18 2315 07/30/18 0356 07/30/18 0400 07/30/18 0800  BP:   (!) 151/99 (!) 132/99  Pulse:      Resp:   18 (!) 28  Temp: (!) 101.1 F (38.4 C) 98.7 F (37.1 C)  98.9 F (37.2 C)  TempSrc: Oral Oral  Oral  SpO2:   99% 100%  Weight:      Height:        Intake/Output Summary (Last 24 hours) at 07/30/2018 1147 Last data filed at 07/30/2018 0943 Gross per 24 hour  Intake 4401.34 ml  Output 520 ml  Net 3881.34 ml   Filed Weights   07/27/18 2136  Weight: 111.1 kg    Examination:  General exam: Appears calm and comfortable, no acute distress Respiratory system: Bilateral decreased breath sounds at bases, tachypneic intermittently.  No wheezing Cardiovascular system: Rate controlled, S1-S2 heard Gastrointestinal system: Abdomen is nondistended, soft and nontender. Normal bowel sounds heard. Extremities: No cyanosis; bilateral lower extremity edematous Lymph: No cervical lymphadenopathy Psych: Normal mood and affect Skin: Left foot dorsal aspect has a wound with serosanguineous drainage with surrounding erythema and warmth CNS: Alert, awake.  Moving extremities.  No focal neuro deficit  Data Reviewed: I have personally  reviewed following labs and imaging studies  CBC: Recent Labs  Lab 07/27/18 2216 07/28/18 0518 07/29/18 0328 07/30/18 0254  WBC 14.7* 14.3* 14.8* 10.2  NEUTROABS 12.9*  --  12.1* 8.1*  HGB 13.3 12.6* 13.7 12.8*  HCT 42.9 39.6 43.0 38.9*  MCV 94.3 95.2 90.3  88.8  PLT 261 252 268 517   Basic Metabolic Panel: Recent Labs  Lab 07/28/18 0012 07/28/18 0518 07/28/18 0843 07/29/18 0328 07/30/18 0254  NA 133* 135 132* 131* 134*  K 5.3* 4.2 3.9 3.7 3.7  CL 103 109 106 100 105  CO2 <7* 12* 16* 16* 20*  GLUCOSE 379* 192* 164* 255* 159*  BUN 15 15 15 10 8   CREATININE 1.15 0.92 0.84 0.76 0.65  CALCIUM 8.9 8.8* 8.8* 8.8* 8.0*  MG  --   --   --  2.0 2.0   GFR: Estimated Creatinine Clearance: 146.2 mL/min (by C-G formula based on SCr of 0.65 mg/dL). Liver Function Tests: Recent Labs  Lab 07/27/18 2216  AST 18  ALT 20  ALKPHOS 75  BILITOT 1.7*  PROT 8.3*  ALBUMIN 3.5   Recent Labs  Lab 07/27/18 2316  LIPASE 24   No results for input(s): AMMONIA in the last 168 hours. Coagulation Profile: No results for input(s): INR, PROTIME in the last 168 hours. Cardiac Enzymes: Recent Labs  Lab 07/27/18 2316  TROPONINI <0.03   BNP (last 3 results) No results for input(s): PROBNP in the last 8760 hours. HbA1C: Recent Labs    07/28/18 0518  HGBA1C 9.6*   CBG: Recent Labs  Lab 07/29/18 0806 07/29/18 1150 07/29/18 1634 07/29/18 2142 07/30/18 0819  GLUCAP 264* 227* 221* 188* 284*   Lipid Profile: No results for input(s): CHOL, HDL, LDLCALC, TRIG, CHOLHDL, LDLDIRECT in the last 72 hours. Thyroid Function Tests: No results for input(s): TSH, T4TOTAL, FREET4, T3FREE, THYROIDAB in the last 72 hours. Anemia Panel: No results for input(s): VITAMINB12, FOLATE, FERRITIN, TIBC, IRON, RETICCTPCT in the last 72 hours. Sepsis Labs: Recent Labs  Lab 07/27/18 2226 07/28/18 0011  LATICACIDVEN 1.38 1.27    Recent Results (from the past 240 hour(s))  MRSA PCR Screening     Status: None   Collection Time: 07/28/18  2:06 AM  Result Value Ref Range Status   MRSA by PCR NEGATIVE NEGATIVE Final    Comment:        The GeneXpert MRSA Assay (FDA approved for NASAL specimens only), is one component of a comprehensive MRSA  colonization surveillance program. It is not intended to diagnose MRSA infection nor to guide or monitor treatment for MRSA infections. Performed at [facility], [place] [address], [place], [place] [address]   Respiratory Panel by PCR     Status: Abnormal   Collection Time: 07/28/18  6:38 AM  Result Value Ref Range Status   Adenovirus NOT DETECTED NOT DETECTED Final   Coronavirus 229E NOT DETECTED NOT DETECTED Final   Coronavirus HKU1 NOT DETECTED NOT DETECTED Final   Coronavirus NL63 NOT DETECTED NOT DETECTED Final   Coronavirus OC43 NOT DETECTED NOT DETECTED Final   Metapneumovirus NOT DETECTED NOT DETECTED Final   Rhinovirus / Enterovirus NOT DETECTED NOT DETECTED Final   Influenza A NOT DETECTED NOT DETECTED Final   Influenza B NOT DETECTED NOT DETECTED Final   Parainfluenza Virus 1 NOT DETECTED NOT DETECTED Final   Parainfluenza Virus 2 NOT DETECTED NOT DETECTED Final   Parainfluenza Virus 3 NOT DETECTED NOT DETECTED Final   Parainfluenza Virus  4 DETECTED (A) NOT DETECTED Final   Respiratory Syncytial Virus NOT DETECTED NOT DETECTED Final   Bordetella pertussis NOT DETECTED NOT DETECTED Final   Chlamydophila pneumoniae NOT DETECTED NOT DETECTED Final   Mycoplasma pneumoniae NOT DETECTED NOT DETECTED Final    Comment: Performed at [facility], [place] [address], [place], [place] [address]  Culture, blood (routine x 2)     Status: None (Preliminary result)   Collection Time: 07/28/18  7:36 PM  Result Value Ref Range Status   Specimen Description   Final    BLOOD LEFT ANTECUBITAL Performed at [facility] [address] [place], [place] [address]    Special Requests   Final    BOTTLES DRAWN AEROBIC AND ANAEROBIC Blood Culture adequate volume Performed at [facility] [address] [place], [place] [address]    Culture   Final    NO GROWTH 2 DAYS Performed at [facility] [address]  [place], [place] [address]    Report Status PENDING  Incomplete  Culture, blood (routine x 2)     Status: None (Preliminary result)   Collection Time: 07/28/18  7:42 PM  Result Value Ref Range Status   Specimen Description   Final    BLOOD RIGHT HAND Performed at [facility] [address], [place], [place] [address]    Special Requests   Final    BOTTLES DRAWN AEROBIC AND ANAEROBIC Blood Culture adequate volume Performed at [facility] [address], [place], [place] [address]    Culture   Final    NO GROWTH 2 DAYS Performed at [facility] [address], [place], [place] [address]    Report Status PENDING  Incomplete  Culture, Urine     Status: None   Collection Time: 07/29/18 12:15 PM  Result Value Ref Range Status   Specimen Description   Final    URINE, CLEAN CATCH Performed at [facility], [place] [address] [place], [place] [address]    Special Requests   Final    NONE Performed at [facility], [place] [address], [place], [place] [address]    Culture   Final    NO GROWTH Performed at [facility], [place] [address] [place], [place] [address]    Report Status 07/30/2018 FINAL  Final         Radiology Studies: Dg Foot Complete Left  Result Date: 07/30/2018 CLINICAL DATA:  47 year old male with an open wound to the lateral aspect of the left foot EXAM: LEFT FOOT - COMPLETE 3+ VIEW COMPARISON:  None. FINDINGS: Soft tissue swelling with subcutaneous gas in the soft tissues dorsal and lateral to the fifth MTP joint. The underlying bony structures appear intact. No rare fraction, fragmentation or fracture. Soft tissue swelling extends along the dorsum of the foot. No evidence of inflammatory arthropathy. IMPRESSION: 1. Soft tissue swelling and subcutaneous gas concerning for infection, including necrotizing soft tissue infection, dorsal and lateral to the fifth MTP joint. Recommend surgical  consultation. 2. No conventional radiographic evidence of osteomyelitis at this time. Electronically Signed   By: Jacqulynn Cadet M.D.   On: 07/30/2018 09:06   Vas [place] Lower Extremity Venous (dvt)  Result Date: 07/29/2018  Lower Venous Study Indications: Swelling, and Pain.  Performing Technologist: Maudry Mayhew MHA, RDMS, RVT, RDCS  Examination Guidelines: A complete evaluation includes B-mode imaging, spectral Doppler, color Doppler, and power Doppler  as needed of all accessible portions of each vessel. Bilateral testing is considered an integral part of a complete examination. Limited examinations for reoccurring indications may be performed as noted.  Right Venous Findings: +---------+---------------+---------+-----------+----------+-------+          CompressibilityPhasicitySpontaneityPropertiesSummary +---------+---------------+---------+-----------+----------+-------+ CFV      Full           Yes      Yes                          +---------+---------------+---------+-----------+----------+-------+ SFJ      Full                                                 +---------+---------------+---------+-----------+----------+-------+ FV Prox  Full                                                 +---------+---------------+---------+-----------+----------+-------+ FV Mid   Full                                                 +---------+---------------+---------+-----------+----------+-------+ FV DistalFull                                                 +---------+---------------+---------+-----------+----------+-------+ PFV      Full                                                 +---------+---------------+---------+-----------+----------+-------+ POP      Full           Yes      Yes                          +---------+---------------+---------+-----------+----------+-------+ PTV      Full                                                  +---------+---------------+---------+-----------+----------+-------+ PERO     Full                                                 +---------+---------------+---------+-----------+----------+-------+  Left Venous Findings: +---------+---------------+---------+-----------+----------+-------+          CompressibilityPhasicitySpontaneityPropertiesSummary +---------+---------------+---------+-----------+----------+-------+ CFV      Full           Yes      Yes                          +---------+---------------+---------+-----------+----------+-------+ SFJ  Full                                                 +---------+---------------+---------+-----------+----------+-------+ FV Prox  Full                                                 +---------+---------------+---------+-----------+----------+-------+ FV Mid   Full                                                 +---------+---------------+---------+-----------+----------+-------+ FV DistalFull                                                 +---------+---------------+---------+-----------+----------+-------+ PFV      Full                                                 +---------+---------------+---------+-----------+----------+-------+ POP      Full           Yes      Yes                          +---------+---------------+---------+-----------+----------+-------+ PTV      Full                                                 +---------+---------------+---------+-----------+----------+-------+ PERO     Full                                                 +---------+---------------+---------+-----------+----------+-------+    Summary: Right: There is no evidence of deep vein thrombosis in the lower extremity. No cystic structure found in the popliteal fossa. Left: There is no evidence of deep vein thrombosis in the lower extremity. No cystic structure found in the popliteal fossa.  Ultrasound characteristics of enlarged lymph nodes noted in the groin.  *See table(s) above for measurements and observations. Electronically signed by Ruta Hinds MD on 07/29/2018 at 3:31:40 PM.    Final         Scheduled Meds: . enoxaparin (LOVENOX) injection  40 mg Subcutaneous QHS  . insulin aspart  0-15 Units Subcutaneous TID WC  . insulin aspart  0-5 Units Subcutaneous QHS  . insulin aspart  4 Units Subcutaneous TID WC  . insulin detemir  20 Units Subcutaneous Daily  . sodium chloride flush  3 mL Intravenous Q12H   Continuous Infusions: . sodium chloride 125 mL/hr at 07/30/18 0943  . cefTRIAXone (ROCEPHIN)  IV    . metronidazole  LOS: 0 days        Aline August, MD [facility] Pager [phone]  If 7PM-7AM, please contact night-coverage [email or web address] Password [facility] 07/30/2018, 11:47 AM

## 2018-07-30 NOTE — Care Management Note (Signed)
Case Management Note  Patient Details  Name: Taylor Hughes MRN: [id number] Date of Birth: Aug 20, 1970  Subjective/Objective:                  47 year old male with history of diabetes mellitus type 2 on insulin presented with nausea, vomiting and chills.  He was unable to take his insulin over the last 5 days.  He was found to be in DKA and started on insulin drip along with intravenous fluids.  Left lateral foot ulcer  Action/Plan:  Discharge readiness is indicated by patient meeting Recovery Milestones, including ALL of the following: ? Hemodynamic stability temp102.3/heart rate-148, resp rate=30, bp=151/99 ? Foot ulcer ? Acidosis absent anion gap closed ? Mental status at baseline yes ? Blood glucose under acceptable control no= 284 ? Electrolyte abnormalities absent or acceptable for next level of [place] 134, co2=20 ? Renal function at baseline or acceptable for next level of care yes ? Dehydration absent yes ? Ambulatory no ? Oral hydration, medications, and diet ? IV NS at 100cc/hr, CARB modified diet, Sub-q insulin being readjusted Following for progression of care. Following for cm needs none present at this time.  Expected Discharge Date:                  Expected Discharge Plan:  Home/Self Care  In-House Referral:     Discharge planning Services  CM Consult  Post Acute Care Choice:    Choice offered to:     DME Arranged:    DME Agency:     HH Arranged:    HH Agency:     Status of Service:  In process, will continue to follow  If discussed at Long Length of Stay Meetings, dates discussed:    Additional Comments:  Leeroy Cha, RN 07/30/2018, 10:52 AM

## 2018-07-30 NOTE — Anesthesia Procedure Notes (Signed)
Procedure Name: LMA Insertion Date/Time: 07/30/2018 6:42 PM Performed by: Anne Fu, CRNA Pre-anesthesia Checklist: Patient identified, Emergency Drugs available, Suction available, Patient being monitored and Timeout performed Patient Re-evaluated:Patient Re-evaluated prior to induction Oxygen Delivery Method: Circle system utilized Preoxygenation: Pre-oxygenation with 100% oxygen Induction Type: IV induction Ventilation: Mask ventilation without difficulty LMA: LMA inserted LMA Size: 4.0 Number of attempts: 1 Placement Confirmation: positive ETCO2 and breath sounds checked- equal and bilateral Tube secured with: Tape

## 2018-07-30 NOTE — Progress Notes (Signed)
  Echocardiogram 2D Echocardiogram has been performed.  Taylor Hughes 07/30/2018, 8:50 AM

## 2018-07-30 NOTE — Brief Op Note (Signed)
07/30/2018  7:27 PM  PATIENT:  Taylor Hughes  47 y.o. male  PRE-OPERATIVE DIAGNOSIS:  left foot infection  POST-OPERATIVE DIAGNOSIS:  left foot infection  PROCEDURE:  Procedure(s): IRRIGATION AND DEBRIDEMENT LEFT FOOT WITH POSSIBLE AMPUTATION OF FIFTH TOE (Left)  SURGEON:  Surgeon(s) and Role:    Mcarthur Rossetti, MD - Primary  PHYSICIAN ASSISTANT: Benita Stabile, PA-C  ANESTHESIA:   general  COUNTS:  YES  TOURNIQUET:  * No tourniquets in log *  DICTATION: .Other Dictation: Dictation Number [id number]  PLAN OF CARE: Admit to inpatient   PATIENT DISPOSITION:  PACU - hemodynamically stable.   Delay start of Pharmacological VTE agent (>24hrs) due to surgical blood loss or risk of bleeding: no

## 2018-07-30 NOTE — Progress Notes (Signed)
Pharmacy Antibiotic Note  Taylor Hughes is a 47 y.o. male admitted on 07/27/2018 with DKA.  Pharmacy has been consulted for vancomycin dosing for cellulitis of L foot.  Tmax 102.3, WBC down to WNL. SCr WNL. Wt 111 kg. Left foot xray shows soft tissue swelling along with subcutaneous gas concerning for infection, including necrotizing soft tissue infection. Dr. Blackman/orthopedics consulted.  Plan: Vancomycin 2500 mg IV loading dose then vancomycin 1000 mg IV q8h (using SCr 0.7, IBW/ABW)  Ceftriaxone 2 gm IV q24 per MD Flagyl 500 mg IV q8h per MD F/u renal fxn, WBC, temp, culture data, clinical course Vancomycin levels as needed  Height: 5\' 11"  (180.3 cm) Weight: 245 lb (111.1 kg) IBW/kg (Calculated) : 75.3  Temp (24hrs), Avg:99.8 F (37.7 C), Min:98.3 F (36.8 C), Max:102.3 F (39.1 C)  Recent Labs  Lab 07/27/18 2216 07/27/18 2226 07/28/18 0011 07/28/18 0012 07/28/18 0518 07/28/18 0843 07/29/18 0328 07/30/18 0254  WBC 14.7*  --   --   --  14.3*  --  14.8* 10.2  CREATININE 1.14  --   --  1.15 0.92 0.84 0.76 0.65  LATICACIDVEN  --  1.38 1.27  --   --   --   --   --     Estimated Creatinine Clearance: 146.2 mL/min (by C-G formula based on SCr of 0.65 mg/dL).    No Known Allergies  Antimicrobials this admission: 12/20 vanc>> 12/20 ceftriaxone>> 12/20 flagyl>>  Dose adjustments this admission:  Microbiology results: 12/19 UCx>NGF 12/18 BCx2>>ngtd 12/18 resp panel> + for parainfluenza virus 4 12/18 MRSA PCR neg 12/18 HIV neg  Thank you for allowing pharmacy to be a part of this patient's care.  Eudelia Bunch, Pharm.D [phone] 07/30/2018 12:00 PM

## 2018-07-30 NOTE — Transfer of Care (Signed)
Immediate Anesthesia Transfer of Care Note  Patient: Taylor Hughes  Procedure(s) Performed: Procedure(s): IRRIGATION AND DEBRIDEMENT LEFT FOOT WITH POSSIBLE AMPUTATION OF FIFTH TOE (Left)  Patient Location: PACU  Anesthesia Type:General  Level of Consciousness:  sedated, patient cooperative and responds to stimulation  Airway & Oxygen Therapy:Patient Spontanous Breathing and Patient connected to face mask oxgen  Post-op Assessment:  Report given to PACU RN and Post -op Vital signs reviewed and stable  Post vital signs:  Reviewed and stable  Last Vitals:  Vitals:   07/30/18 1742 07/30/18 1930  BP: 137/84   Pulse:  (!) 138  Resp: (!) 22 (!) 25  Temp:  (!) 38.6 C  SpO2: 99% 144%    Complications: No apparent anesthesia complications

## 2018-07-30 NOTE — Consult Note (Signed)
Reason for Consult:  Left foot infection Referring Physician: Aline August, MD - [facility]  Taylor Hughes is an 47 y.o. male.  HPI: Patient is a 47 year old diabetic male is been recently admitted due to a significant infection involving his left foot.  He does report some peripheral neuropathy but denies any wound previously on that foot.  He reports that it is been due to some type of shoe or sock wear.  He presented to the emergency room a few days ago with a significant infection with his left foot.  He reports that his diabetic control has not been great.  He works as a Training and development officer.  He does report some foot pain but not severe.  He has been on IV antibiotics.  Orthopedic surgery is consulted for evaluation treatment of this left foot infection to consider operative intervention.  The patient is tachycardic but with a sinus rhythm.  His other vital signs are stable and he is nonseptic appearing.  He denies any chest pain or shortness of breath.  Past Medical History:  Diagnosis Date  . Diabetes mellitus without complication ([place])     History reviewed. No pertinent surgical history.  Family History  Problem Relation Age of Onset  . Diabetes Mother     Social History:  reports that he has never smoked. He has never used smokeless tobacco. He reports that he does not drink alcohol or use drugs.  Allergies: No Known Allergies  Medications: I have reviewed the patient's current medications.  Results for orders placed or performed during the hospital encounter of 07/27/18 (from the past 48 hour(s))  Glucose, capillary     Status: Abnormal   Collection Time: 07/28/18  5:47 PM  Result Value Ref Range   Glucose-Capillary 219 (H) 70 - 99 mg/dL  Culture, blood (routine x 2)     Status: None (Preliminary result)   Collection Time: 07/28/18  7:36 PM  Result Value Ref Range   Specimen Description      BLOOD LEFT ANTECUBITAL Performed at [facility], [place] [address], [place], [place] [address]    Special Requests      BOTTLES DRAWN AEROBIC AND ANAEROBIC Blood Culture adequate volume Performed at [place] [address], [place], [place] [address]    Culture      NO GROWTH 2 DAYS Performed at [facility], [place] [address] [place], [place] [address]    Report Status PENDING   Culture, blood (routine x 2)     Status: None (Preliminary result)   Collection Time: 07/28/18  7:42 PM  Result Value Ref Range   Specimen Description      BLOOD RIGHT HAND Performed at [place] [address] [place], [place] [address]    Special Requests      BOTTLES DRAWN AEROBIC AND ANAEROBIC Blood Culture adequate volume Performed at [place] [address], [place], [place] [address]    Culture      NO GROWTH 2 DAYS Performed at [facility], [place] [address] [place],  [address]    Report Status PENDING   Glucose, capillary     Status: Abnormal   Collection Time: 07/28/18  9:49 PM  Result Value Ref Range   Glucose-Capillary 208 (H) 70 - 99 mg/dL   Comment 1 Notify RN    Comment 2 Document in Chart   Basic metabolic panel     Status: Abnormal  Collection Time: 07/29/18  3:28 AM  Result Value Ref Range   Sodium 131 (L) 135 - 145 mmol/L   Potassium 3.7 3.5 - 5.1 mmol/L   Chloride 100 98 - 111 mmol/L   CO2 16 (L) 22 - 32 mmol/L   Glucose, Bld 255 (H) 70 - 99 mg/dL   BUN 10 6 - 20 mg/dL   Creatinine, Ser 0.76 0.61 - 1.24 mg/dL   Calcium 8.8 (L) 8.9 - 10.3 mg/dL   GFR calc non Af Amer >60 >60 mL/min   GFR calc Af Amer >60 >60 mL/min   Anion gap 15 5 - 15    Comment: Performed at [facility] [place] [address] [place], [place] [address]  CBC with Differential/Platelet     Status: Abnormal   Collection Time: 07/29/18  3:28 AM  Result Value Ref Range   WBC 14.8 (H) 4.0 - 10.5 K/uL    Comment: WHITE COUNT CONFIRMED ON SMEAR   RBC 4.76 4.22 - 5.81 MIL/uL    Hemoglobin 13.7 13.0 - 17.0 g/dL   HCT 43.0 39.0 - 52.0 %   MCV 90.3 80.0 - 100.0 fL   MCH 28.8 26.0 - 34.0 pg   MCHC 31.9 30.0 - 36.0 g/dL   RDW 14.4 11.5 - 15.5 %   Platelets 268 150 - 400 K/uL   nRBC 0.0 0.0 - 0.2 %   Neutrophils Relative % 81 %   Neutro Abs 12.1 (H) 1.7 - 7.7 K/uL   Lymphocytes Relative 8 %   Lymphs Abs 1.1 0.7 - 4.0 K/uL   Monocytes Relative 10 %   Monocytes Absolute 1.5 (H) 0.1 - 1.0 K/uL   Eosinophils Relative 0 %   Eosinophils Absolute 0.0 0.0 - 0.5 K/uL   Basophils Relative 0 %   Basophils Absolute 0.0 0.0 - 0.1 K/uL   Immature Granulocytes 1 %   Abs Immature Granulocytes 0.08 (H) 0.00 - 0.07 K/uL    Comment: Performed at [facility], [place] [address], [place], [place] [address]  Magnesium     Status: None   Collection Time: 07/29/18  3:28 AM  Result Value Ref Range   Magnesium 2.0 1.7 - 2.4 mg/dL    Comment: Performed at [facility], [place] [address] [place], [place] [address]  Glucose, capillary     Status: Abnormal   Collection Time: 07/29/18  8:06 AM  Result Value Ref Range   Glucose-Capillary 264 (H) 70 - 99 mg/dL   Comment 1 Notify RN    Comment 2 Document in Chart   Glucose, capillary     Status: Abnormal   Collection Time: 07/29/18 11:50 AM  Result Value Ref Range   Glucose-Capillary 227 (H) 70 - 99 mg/dL   Comment 1 Notify RN    Comment 2 Document in Chart   Urinalysis, Routine w reflex microscopic     Status: Abnormal   Collection Time: 07/29/18 12:15 PM  Result Value Ref Range   Color, Urine YELLOW YELLOW   APPearance HAZY (A) CLEAR   Specific Gravity, Urine 1.032 (H) 1.005 - 1.030   pH 6.0 5.0 - 8.0   Glucose, UA >=500 (A) NEGATIVE mg/dL   Hgb urine dipstick SMALL (A) NEGATIVE   Bilirubin Urine NEGATIVE NEGATIVE   Ketones, ur 80 (A) NEGATIVE mg/dL   Protein, ur 100 (A) NEGATIVE mg/dL   Nitrite NEGATIVE NEGATIVE   Leukocytes, UA NEGATIVE NEGATIVE   RBC / HPF 0-5 0 - 5 RBC/hpf  WBC, UA  0-5 0 - 5 WBC/hpf   Bacteria, UA RARE (A) NONE SEEN   Squamous Epithelial / LPF 0-5 0 - 5   Mucus PRESENT     Comment: Performed at [facility], [place] [address] [place], [place] [address]  Culture, Urine     Status: None   Collection Time: 07/29/18 12:15 PM  Result Value Ref Range   Specimen Description      URINE, CLEAN CATCH Performed at [facility], [place] [address] [place], [place] [address]    Special Requests      NONE Performed at [facility], [place] [address] [place], [place] [address]    Culture      NO GROWTH Performed at [facility], [place] [address] [place], [place] [address]    Report Status 07/30/2018 FINAL   Glucose, capillary     Status: Abnormal   Collection Time: 07/29/18  4:34 PM  Result Value Ref Range   Glucose-Capillary 221 (H) 70 - 99 mg/dL   Comment 1 Notify RN    Comment 2 Document in Chart   Glucose, capillary     Status: Abnormal   Collection Time: 07/29/18  9:42 PM  Result Value Ref Range   Glucose-Capillary 188 (H) 70 - 99 mg/dL  Basic metabolic panel     Status: Abnormal   Collection Time: 07/30/18  2:54 AM  Result Value Ref Range   Sodium 134 (L) 135 - 145 mmol/L   Potassium 3.7 3.5 - 5.1 mmol/L   Chloride 105 98 - 111 mmol/L   CO2 20 (L) 22 - 32 mmol/L   Glucose, Bld 159 (H) 70 - 99 mg/dL   BUN 8 6 - 20 mg/dL   Creatinine, Ser 0.65 0.61 - 1.24 mg/dL   Calcium 8.0 (L) 8.9 - 10.3 mg/dL   GFR calc non Af Amer >60 >60 mL/min   GFR calc Af Amer >60 >60 mL/min   Anion gap 9 5 - 15    Comment: Performed at [facility], [place] [address] [place],  [address]  CBC with Differential/Platelet     Status: Abnormal   Collection Time: 07/30/18  2:54 AM  Result Value Ref Range   WBC 10.2 4.0 - 10.5 K/uL    Comment: WHITE COUNT CONFIRMED ON SMEAR   RBC 4.38 4.22 - 5.81 MIL/uL   Hemoglobin 12.8 (L) 13.0 - 17.0 g/dL   HCT 38.9 (L) 39.0 - 52.0 %   MCV 88.8 80.0 -  100.0 fL   MCH 29.2 26.0 - 34.0 pg   MCHC 32.9 30.0 - 36.0 g/dL   RDW 14.3 11.5 - 15.5 %   Platelets 288 150 - 400 K/uL   nRBC 0.0 0.0 - 0.2 %   Neutrophils Relative % 78 %   Neutro Abs 8.1 (H) 1.7 - 7.7 K/uL   Lymphocytes Relative 12 %   Lymphs Abs 1.2 0.7 - 4.0 K/uL   Monocytes Relative 9 %   Monocytes Absolute 0.9 0.1 - 1.0 K/uL   Eosinophils Relative 0 %   Eosinophils Absolute 0.0 0.0 - 0.5 K/uL   Basophils Relative 0 %   Basophils Absolute 0.0 0.0 - 0.1 K/uL   Immature Granulocytes 1 %   Abs Immature Granulocytes 0.06 0.00 - 0.07 K/uL    Comment: Performed at [facility], [place] [address], [place],  [address]  Magnesium     Status: None   Collection  Time: 07/30/18  2:54 AM  Result Value Ref Range   Magnesium 2.0 1.7 - 2.4 mg/dL    Comment: Performed at [facility], [place] [address], [place], [place] [address]  Glucose, capillary     Status: Abnormal   Collection Time: 07/30/18  8:19 AM  Result Value Ref Range   Glucose-Capillary 284 (H) 70 - 99 mg/dL  Glucose, capillary     Status: Abnormal   Collection Time: 07/30/18 11:59 AM  Result Value Ref Range   Glucose-Capillary 154 (H) 70 - 99 mg/dL    Dg Foot Complete Left  Result Date: 07/30/2018 CLINICAL DATA:  47 year old male with an open wound to the lateral aspect of the left foot EXAM: LEFT FOOT - COMPLETE 3+ VIEW COMPARISON:  None. FINDINGS: Soft tissue swelling with subcutaneous gas in the soft tissues dorsal and lateral to the fifth MTP joint. The underlying bony structures appear intact. No rare fraction, fragmentation or fracture. Soft tissue swelling extends along the dorsum of the foot. No evidence of inflammatory arthropathy. IMPRESSION: 1. Soft tissue swelling and subcutaneous gas concerning for infection, including necrotizing soft tissue infection, dorsal and lateral to the fifth MTP joint. Recommend surgical consultation. 2. No conventional radiographic evidence of  osteomyelitis at this time. Electronically Signed   By: Taylor Hughes M.D.   On: 07/30/2018 09:06   Vas [place] Lower Extremity Venous (dvt)  Result Date: 07/29/2018  Lower Venous Study Indications: Swelling, and Pain.  Performing Technologist: Maudry Mayhew MHA, RDMS, RVT, RDCS  Examination Guidelines: A complete evaluation includes B-mode imaging, spectral Doppler, color Doppler, and power Doppler as needed of all accessible portions of each vessel. Bilateral testing is considered an integral part of a complete examination. Limited examinations for reoccurring indications may be performed as noted.  Right Venous Findings: +---------+---------------+---------+-----------+----------+-------+          CompressibilityPhasicitySpontaneityPropertiesSummary +---------+---------------+---------+-----------+----------+-------+ CFV      Full           Yes      Yes                          +---------+---------------+---------+-----------+----------+-------+ SFJ      Full                                                 +---------+---------------+---------+-----------+----------+-------+ FV Prox  Full                                                 +---------+---------------+---------+-----------+----------+-------+ FV Mid   Full                                                 +---------+---------------+---------+-----------+----------+-------+ FV DistalFull                                                 +---------+---------------+---------+-----------+----------+-------+ PFV      Full                                                 +---------+---------------+---------+-----------+----------+-------+  POP      Full           Yes      Yes                          +---------+---------------+---------+-----------+----------+-------+ PTV      Full                                                  +---------+---------------+---------+-----------+----------+-------+ PERO     Full                                                 +---------+---------------+---------+-----------+----------+-------+  Left Venous Findings: +---------+---------------+---------+-----------+----------+-------+          CompressibilityPhasicitySpontaneityPropertiesSummary +---------+---------------+---------+-----------+----------+-------+ CFV      Full           Yes      Yes                          +---------+---------------+---------+-----------+----------+-------+ SFJ      Full                                                 +---------+---------------+---------+-----------+----------+-------+ FV Prox  Full                                                 +---------+---------------+---------+-----------+----------+-------+ FV Mid   Full                                                 +---------+---------------+---------+-----------+----------+-------+ FV DistalFull                                                 +---------+---------------+---------+-----------+----------+-------+ PFV      Full                                                 +---------+---------------+---------+-----------+----------+-------+ POP      Full           Yes      Yes                          +---------+---------------+---------+-----------+----------+-------+ PTV      Full                                                 +---------+---------------+---------+-----------+----------+-------+  PERO     Full                                                 +---------+---------------+---------+-----------+----------+-------+    Summary: Right: There is no evidence of deep vein thrombosis in the lower extremity. No cystic structure found in the popliteal fossa. Left: There is no evidence of deep vein thrombosis in the lower extremity. No cystic structure found in the popliteal fossa.  Ultrasound characteristics of enlarged lymph nodes noted in the groin.  *See table(s) above for measurements and observations. Electronically signed by Ruta Hinds MD on 07/29/2018 at 3:31:40 PM.    Final     Independent review of x-rays of the left foot with 3 views show air in the soft tissues and dorsal foot swelling.  There is no destruction of the bone in this area to suggest osteomyelitis.  ROS Blood pressure (!) 132/99, pulse (!) 104, temperature 98.2 F (36.8 C), temperature source Oral, resp. rate (!) 28, height 5\' 11"  (1.803 m), weight 111.1 kg, SpO2 100 %. Physical Exam  Examination of his left foot does show a dorsal and lateral wound over the fifth ray.  There is gross purulence in this area as well as an obvious blister with some necrotic skin.  The forefoot and midfoot are swollen with obvious cellulitis.  His foot does seem to be perfused but I cannot palpate any strong pulse at all.  He has diminished sensation over both feet.  Assessment/Plan: Left foot infection  I talked to the patient in length.  We will proceed to the operating room today for irrigation debridement of the left foot to try to get the infection under control.  This for, plain films are negative for evidence of osteomyelitis so initially we are not looking at any type of amputation.  Today will be about trying to get the infection under control.  I have explained this to him in detail as well as the risk and benefits of surgery.  I have communicated with the primary service as well.  He has tachycardia but it is sinus tachycardia.  They feel that this is mainly from his infection and that an irrigation debridement will help.  Taylor Hughes 07/30/2018, 4:49 PM

## 2018-07-31 ENCOUNTER — Encounter (HOSPITAL_COMMUNITY): Payer: Self-pay | Admitting: Orthopaedic Surgery

## 2018-07-31 DIAGNOSIS — R52 Pain, unspecified: Secondary | ICD-10-CM

## 2018-07-31 DIAGNOSIS — N179 Acute kidney failure, unspecified: Secondary | ICD-10-CM

## 2018-07-31 DIAGNOSIS — A419 Sepsis, unspecified organism: Principal | ICD-10-CM

## 2018-07-31 DIAGNOSIS — R6520 Severe sepsis without septic shock: Secondary | ICD-10-CM

## 2018-07-31 LAB — CBC WITH DIFFERENTIAL/PLATELET
Abs Immature Granulocytes: 0.06 10*3/uL (ref 0.00–0.07)
Basophils Absolute: 0 10*3/uL (ref 0.0–0.1)
Basophils Relative: 0 %
Eosinophils Absolute: 0 10*3/uL (ref 0.0–0.5)
Eosinophils Relative: 0 %
HCT: 37 % — ABNORMAL LOW (ref 39.0–52.0)
Hemoglobin: 12.1 g/dL — ABNORMAL LOW (ref 13.0–17.0)
Immature Granulocytes: 1 %
Lymphocytes Relative: 9 %
Lymphs Abs: 1 10*3/uL (ref 0.7–4.0)
MCH: 28.9 pg (ref 26.0–34.0)
MCHC: 32.7 g/dL (ref 30.0–36.0)
MCV: 88.3 fL (ref 80.0–100.0)
Monocytes Absolute: 1.3 10*3/uL — ABNORMAL HIGH (ref 0.1–1.0)
Monocytes Relative: 11 %
Neutro Abs: 9 10*3/uL — ABNORMAL HIGH (ref 1.7–7.7)
Neutrophils Relative %: 79 %
PLATELETS: 314 10*3/uL (ref 150–400)
RBC: 4.19 MIL/uL — AB (ref 4.22–5.81)
RDW: 14.4 % (ref 11.5–15.5)
WBC: 11.3 10*3/uL — AB (ref 4.0–10.5)
nRBC: 0 % (ref 0.0–0.2)

## 2018-07-31 LAB — GLUCOSE, CAPILLARY
Glucose-Capillary: 253 mg/dL — ABNORMAL HIGH (ref 70–99)
Glucose-Capillary: 172 mg/dL — ABNORMAL HIGH (ref 70–99)
Glucose-Capillary: 142 mg/dL — ABNORMAL HIGH (ref 70–99)
Glucose-Capillary: 92 mg/dL (ref 70–99)

## 2018-07-31 LAB — BASIC METABOLIC PANEL
Anion gap: 14 (ref 5–15)
BUN: 8 mg/dL (ref 6–20)
CO2: 20 mmol/L — ABNORMAL LOW (ref 22–32)
Calcium: 7.7 mg/dL — ABNORMAL LOW (ref 8.9–10.3)
Chloride: 100 mmol/L (ref 98–111)
Creatinine, Ser: 0.71 mg/dL (ref 0.61–1.24)
Glucose, Bld: 191 mg/dL — ABNORMAL HIGH (ref 70–99)
Potassium: 2.8 mmol/L — ABNORMAL LOW (ref 3.5–5.1)
SODIUM: 134 mmol/L — AB (ref 135–145)

## 2018-07-31 LAB — MAGNESIUM: Magnesium: 1.6 mg/dL — ABNORMAL LOW (ref 1.7–2.4)

## 2018-07-31 MED ORDER — POTASSIUM CHLORIDE IN NACL 20-0.9 MEQ/L-% IV SOLN
INTRAVENOUS | Status: DC
  Administered 2018-07-31 – 2018-08-04 (×6): via INTRAVENOUS
  Filled 2018-07-31 (×9): qty 1000

## 2018-07-31 MED ORDER — MAGNESIUM SULFATE 2 GM/50ML IV SOLN
2.0000 g | Freq: Once | INTRAVENOUS | Status: AC
  Administered 2018-07-31: 2 g via INTRAVENOUS
  Filled 2018-07-31: qty 50

## 2018-07-31 MED ORDER — INSULIN GLARGINE 100 UNIT/ML ~~LOC~~ SOLN
15.0000 [IU] | Freq: Two times a day (BID) | SUBCUTANEOUS | Status: DC
  Administered 2018-07-31 – 2018-08-04 (×8): 15 [IU] via SUBCUTANEOUS
  Filled 2018-07-31 (×10): qty 0.15

## 2018-07-31 MED ORDER — DILTIAZEM HCL 25 MG/5ML IV SOLN
5.0000 mg | Freq: Once | INTRAVENOUS | Status: AC
  Administered 2018-07-31: 5 mg via INTRAVENOUS
  Filled 2018-07-31: qty 5

## 2018-07-31 MED ORDER — BENZONATATE 100 MG PO CAPS
200.0000 mg | ORAL_CAPSULE | Freq: Three times a day (TID) | ORAL | Status: DC | PRN
  Administered 2018-07-31 – 2018-08-01 (×3): 200 mg via ORAL
  Filled 2018-07-31 (×3): qty 2

## 2018-07-31 MED ORDER — LACTATED RINGERS IV BOLUS
500.0000 mL | Freq: Once | INTRAVENOUS | Status: AC
  Administered 2018-07-31: 500 mL via INTRAVENOUS

## 2018-07-31 MED ORDER — POTASSIUM CHLORIDE CRYS ER 20 MEQ PO TBCR
40.0000 meq | EXTENDED_RELEASE_TABLET | ORAL | Status: AC
  Administered 2018-07-31 (×2): 40 meq via ORAL
  Filled 2018-07-31 (×2): qty 2

## 2018-07-31 MED ORDER — BACLOFEN 10 MG PO TABS
5.0000 mg | ORAL_TABLET | Freq: Once | ORAL | Status: AC
  Administered 2018-07-31: 5 mg via ORAL
  Filled 2018-07-31: qty 1

## 2018-07-31 NOTE — Op Note (Signed)
Taylor Hughes, Taylor Hughes MEDICAL RECORD [id number] ACCOUNT [id number] DATE OF BIRTH:[birth year] FACILITY: [facility] LOCATION: [facility] PHYSICIAN:CHRISTOPHER Kerry Fort, MD  OPERATIVE REPORT  DATE OF PROCEDURE:  07/30/2018  PREOPERATIVE DIAGNOSIS:  Left foot abscess.  POSTOPERATIVE DIAGNOSIS:  Left foot abscess.  PROCEDURE:  Irrigation and debridement of left foot abscess including sharp excisional debridement of necrotic skin and fascia of the right foot over 5-7 cm.  FINDINGS:  Gross purulence of the dorsum and lateral aspect of the right foot involving also the fifth ray.  SURGEON:  Lind Guest. Ninfa Linden, MD  ASSISTANT:  Erskine Emery, PA-C  ANESTHESIA:  General.  ESTIMATED BLOOD LOSS:  Less than 100 mL.  COMPLICATIONS:  None.  INDICATIONS:  The patient is a 47 year old diabetic male who presented to this hospital several days ago with a left foot infection.  Orthopedic surgery was consulted today due to overwhelming infection.  I am not sure why the consultation was not done  earlier.  X-rays of his foot show air in the soft tissues.  He has a significant malodorous infection involving his left foot with gross purulence.  Plain films showed no destruction of the bone to suggest osteomyelitis, but certainly has a significant  wound with his foot.  I had a long and thorough discussion with him about an irrigation and debridement today of the foot to temporize things.  There is a high likelihood of needing to lose his fifth toe.  He wished to proceed with [place] just performing and  I and D today to get a better idea of what is going on with his foot in general.  DESCRIPTION OF PROCEDURE:  After informed consent was obtained and appropriate left foot was marked.  He was brought to the operating room and placed on the operating table.  General anesthesia was then obtained.  His left foot was prepped and draped  with Betadine scrub and paint.  Timeout was called to identify correct patient's  left foot.  There is a weeping wound with gross purulence of the lateral aspect of his foot at the fifth ray.  I opened this up to extensively with a #10 blade.  We did  perform a sharp excisional debridement of necrotic skin and fascia in this area.  We found a large overwhelming infection, mainly tracking dorsally.  We were concerned about the viability of the fifth toe.  Once we had removed all the necrotic tissue  that we could find, we did irrigate the dorsum of the foot and the wound itself with pulsatile lavage and 3 liters normal saline solution, which we felt thoroughly cleaned the wound.  We then loosely reapproximated the skin on the dorsum aspect of the  foot with 2-0 nylon suture.  There is a plantar lateral wound of the fifth metatarsal head that we placed Xeroform over as well and then a well-padded sterile dressing.  He was awakened, extubated, and taken to recovery room in stable condition.  All  final counts were correct.  There were no complications noted.  Postoperatively, we will have him in a postoperative shoe and continue IV antibiotics for at least the next 24-48 hours to make sure that he is being controlled from an infection standpoint.   Further definitive treatment may be necessary in terms of the potential for a fifth ray resection and a repeat I and D.  He will be followed closely.  TN/NUANCE  D:07/30/2018 T:07/31/2018 JOB:[id number]

## 2018-07-31 NOTE — Progress Notes (Signed)
Pt returned from PACU with blood pressure of 93/48 and heart rate of 150 at 2028. BP rechecked at 2053 showed 80/55 both automatic and manually and heart rate of 155. MD ordered 500cc bolus, 1L bolus, and then 500cc bolus with 5mg  Cardizem IV. Blood pressure improved to 122/82, heart rate 115-125. IV fluids continued at 50 mL/hr, will continue to monitor for change in status.

## 2018-07-31 NOTE — Progress Notes (Signed)
Patient ID: Linsey Hirota, male   DOB: 1971/03/07, 47 y.o.   MRN: [id number]  PROGRESS NOTE    Kavion Mancinas  JJK:[id number] DOB: 1970/09/14 DOA: 07/27/2018 PCP: Jilda Panda, MD   Brief Narrative:  47 year old male with history of diabetes mellitus type 2 on insulin presented with nausea, vomiting and chills.  He was unable to take his insulin over the last 5 days.  He was found to be in DKA and started on insulin drip along with intravenous fluids.  07/31/2018: Patient underwent I&D of the diabetic left foot abscess yesterday.  Postop, patient became hypotensive and tachycardic.  Hypotension has resolved.  Tachycardia has resolved significantly.  We will continue fluid resuscitation.  Will replete abnormal electrolytes, specifically, potassium and magnesium.  Will change subcutaneous Levemir to subcutaneous Lantus 15 units twice daily.  Will continue to monitor renal function and electrolytes.  We will continue to optimize blood sugar management.  Input from the surgical team is highly appreciated.  Otherwise, patient has no new complaints.   Assessment & Plan:   Principal Problem:   DKA, type 2 ([place]) Active Problems:   Leukocytosis   Hyperkalemia   Hyponatremia   DKA (diabetic ketoacidoses) (HCC)   Left foot infection  DKA in a patient with history of diabetes mellitus type 2 uncontrolled -Initially started on insulin drip.  After anion gap closed, switched to long-acting insulin and insulin drip was subsequently discontinued.   -Blood sugars improving but still on the higher side.  Increase Levemir to 25 units subcutaneous daily.  Increase NovoLog with meals to 6 units 3 times a day.  Continue Accu-Cheks with sliding scale coverage.  Diabetes coordinator following.  -care management consult for insulin needs -Feels that his appetite is better this morning. -Hemoglobin A1c is 9.6. 07/31/2018: DKA has resolved.  Continue sliding scale insulin coverage.  Continue Levemir to Lantus.  Left  foot cellulitis/diabetic foot infection with ulcer -Left foot x-ray shows soft tissue swelling along with subcutaneous gas concerning for infection, including necrotizing soft tissue infection. -Dr. Blackman/orthopedics consulted.  Will keep n.p.o.  Start Rocephin, Flagyl and vancomycin. -Still febrile intermittently with sinus tachycardia.  Discontinue IV fluids as patient complains of bilateral legs swelling.  Lower extremity duplex was negative for DVT in bilateral lower extremity. 07/31/2018: Patient is status post I&D.  Patient became hypotensive and tachycardic afterwards.  Continue aggressive management for possible sepsis/Sirs that is now resolving.  Possible sepsis/SIRS: See above.  Hypokalemia -Replete.   -Continue to monitor.    Noncompliance: Consult.  DVT prophylaxis: Lovenox Code Status: Full Family Communication: None at bedside Disposition Plan: Depends on further evaluation by orthopedics  consultants: Consulted orthopedics  Procedures: None  Antimicrobials: Started Rocephin, Flagyl and vancomycin on 07/30/2018   Subjective: No new complaints. No fever chills No chest pain No shortness of breath   Objective: Vitals:   07/31/18 0500 07/31/18 0600 07/31/18 0800 07/31/18 0900  BP: 124/62 133/70  118/66  Pulse: (!) 111 (!) 112 (!) 105 100  Resp: 14 (!) 33 (!) 31 (!) 24  Temp:  99.6 F (37.6 C) 99.7 F (37.6 C)   TempSrc:  Oral Oral   SpO2: 100% 97% 100% 98%  Weight:      Height:        Intake/Output Summary (Last 24 hours) at 07/31/2018 1021 Last data filed at 07/31/2018 0800 Gross per 24 hour  Intake 2907.56 ml  Output 1260 ml  Net 1647.56 ml   Filed Weights   07/27/18 2136  Weight: 111.1 kg    Examination:  General exam: Appears calm and comfortable, no acute distress.  Obese. Respiratory system: Clear to auscultation.   Cardiovascular system: Rate controlled, S1-S2 heard Gastrointestinal system: Abdomen is obese, soft and nontender.   Organs are difficult to assess.   Extremities: Chronically looking edema.  Left foot is bandaged.   CNS: Alert, awake.  Moving extremities.  No focal neuro deficit  Data Reviewed: I have personally reviewed following labs and imaging studies  CBC: Recent Labs  Lab 07/27/18 2216 07/28/18 0518 07/29/18 0328 07/30/18 0254 07/31/18 0316  WBC 14.7* 14.3* 14.8* 10.2 11.3*  NEUTROABS 12.9*  --  12.1* 8.1* 9.0*  HGB 13.3 12.6* 13.7 12.8* 12.1*  HCT 42.9 39.6 43.0 38.9* 37.0*  MCV 94.3 95.2 90.3 88.8 88.3  PLT 261 252 268 288 295   Basic Metabolic Panel: Recent Labs  Lab 07/28/18 0518 07/28/18 0843 07/29/18 0328 07/30/18 0254 07/31/18 0316  NA 135 132* 131* 134* 134*  K 4.2 3.9 3.7 3.7 2.8*  CL 109 106 100 105 100  CO2 12* 16* 16* 20* 20*  GLUCOSE 192* 164* 255* 159* 191*  BUN 15 15 10 8 8   CREATININE 0.92 0.84 0.76 0.65 0.71  CALCIUM 8.8* 8.8* 8.8* 8.0* 7.7*  MG  --   --  2.0 2.0 1.6*   GFR: Estimated Creatinine Clearance: 146.2 mL/min (by C-G formula based on SCr of 0.71 mg/dL). Liver Function Tests: Recent Labs  Lab 07/27/18 2216  AST 18  ALT 20  ALKPHOS 75  BILITOT 1.7*  PROT 8.3*  ALBUMIN 3.5   Recent Labs  Lab 07/27/18 2316  LIPASE 24   No results for input(s): AMMONIA in the last 168 hours. Coagulation Profile: No results for input(s): INR, PROTIME in the last 168 hours. Cardiac Enzymes: Recent Labs  Lab 07/27/18 2316  TROPONINI <0.03   BNP (last 3 results) No results for input(s): PROBNP in the last 8760 hours. HbA1C: No results for input(s): HGBA1C in the last 72 hours. CBG: Recent Labs  Lab 07/30/18 0819 07/30/18 1159 07/30/18 1650 07/30/18 2135 07/31/18 0812  GLUCAP 284* 154* 145* 102* 253*   Lipid Profile: No results for input(s): CHOL, HDL, LDLCALC, TRIG, CHOLHDL, LDLDIRECT in the last 72 hours. Thyroid Function Tests: No results for input(s): TSH, T4TOTAL, FREET4, T3FREE, THYROIDAB in the last 72 hours. Anemia Panel: No results  for input(s): VITAMINB12, FOLATE, FERRITIN, TIBC, IRON, RETICCTPCT in the last 72 hours. Sepsis Labs: Recent Labs  Lab 07/27/18 2226 07/28/18 0011  LATICACIDVEN 1.38 1.27    Recent Results (from the past 240 hour(s))  MRSA PCR Screening     Status: None   Collection Time: 07/28/18  2:06 AM  Result Value Ref Range Status   MRSA by PCR NEGATIVE NEGATIVE Final    Comment:        The GeneXpert MRSA Assay (FDA approved for NASAL specimens only), is one component of a comprehensive MRSA colonization surveillance program. It is not intended to diagnose MRSA infection nor to guide or monitor treatment for MRSA infections. Performed at [facility], [place] [address], [place], [place] [address]   Respiratory Panel by PCR     Status: Abnormal   Collection Time: 07/28/18  6:38 AM  Result Value Ref Range Status   Adenovirus NOT DETECTED NOT DETECTED Final   Coronavirus 229E NOT DETECTED NOT DETECTED Final   Coronavirus HKU1 NOT DETECTED NOT DETECTED Final   Coronavirus NL63 NOT DETECTED NOT DETECTED  Final   Coronavirus OC43 NOT DETECTED NOT DETECTED Final   Metapneumovirus NOT DETECTED NOT DETECTED Final   Rhinovirus / Enterovirus NOT DETECTED NOT DETECTED Final   Influenza A NOT DETECTED NOT DETECTED Final   Influenza B NOT DETECTED NOT DETECTED Final   Parainfluenza Virus 1 NOT DETECTED NOT DETECTED Final   Parainfluenza Virus 2 NOT DETECTED NOT DETECTED Final   Parainfluenza Virus 3 NOT DETECTED NOT DETECTED Final   Parainfluenza Virus 4 DETECTED (A) NOT DETECTED Final   Respiratory Syncytial Virus NOT DETECTED NOT DETECTED Final   Bordetella pertussis NOT DETECTED NOT DETECTED Final   Chlamydophila pneumoniae NOT DETECTED NOT DETECTED Final   Mycoplasma pneumoniae NOT DETECTED NOT DETECTED Final    Comment: Performed at [facility], [place] [address], [place], [place] [address]  Culture, blood (routine x 2)     Status: None (Preliminary result)   Collection  Time: 07/28/18  7:36 PM  Result Value Ref Range Status   Specimen Description   Final    BLOOD LEFT ANTECUBITAL Performed at [place] [address] [place], [place] [address]    Special Requests   Final    BOTTLES DRAWN AEROBIC AND ANAEROBIC Blood Culture adequate volume Performed at [place] [address] [place], [place] [address]    Culture   Final    NO GROWTH 3 DAYS Performed at [facility], [place] [address], [place], [place] [address]    Report Status PENDING  Incomplete  Culture, blood (routine x 2)     Status: None (Preliminary result)   Collection Time: 07/28/18  7:42 PM  Result Value Ref Range Status   Specimen Description   Final    BLOOD RIGHT HAND Performed at [place] [address], [place], [place] [address]    Special Requests   Final    BOTTLES DRAWN AEROBIC AND ANAEROBIC Blood Culture adequate volume Performed at [facility] [address] [place], [place] [address]    Culture   Final    NO GROWTH 3 DAYS Performed at [facility], [place] [address], [place], [place] [address]    Report Status PENDING  Incomplete  Culture, Urine     Status: None   Collection Time: 07/29/18 12:15 PM  Result Value Ref Range Status   Specimen Description   Final    URINE, CLEAN CATCH Performed at [facility], [place] [address], [place], [place] [address]    Special Requests   Final    NONE Performed at [facility], [place] [address] [place], [place] [address]    Culture   Final    NO GROWTH Performed at [facility], [place] [address] [place], [place] [address]    Report Status 07/30/2018 FINAL  Final  Aerobic/Anaerobic Culture (surgical/deep wound)     Status: None (Preliminary result)   Collection Time: 07/30/18  6:59 PM  Result Value Ref Range Status   Specimen Description ABSCESS LEFT FOOT  Final   Special Requests    Final    NONE Performed at [facility] [address], [place], [place] [address]    Gram Stain   Final    NO WBC SEEN ABUNDANT GRAM POSITIVE COCCI ABUNDANT GRAM NEGATIVE RODS    Culture PENDING  Incomplete   Report Status PENDING  Incomplete         Radiology Studies: Dg  Foot Complete Left  Result Date: 07/30/2018 CLINICAL DATA:  47 year old male with an open wound to the lateral aspect of the left foot EXAM: LEFT FOOT - COMPLETE 3+ VIEW COMPARISON:  None. FINDINGS: Soft tissue swelling with subcutaneous gas in the soft tissues dorsal and lateral to the fifth MTP joint. The underlying bony structures appear intact. No rare fraction, fragmentation or fracture. Soft tissue swelling extends along the dorsum of the foot. No evidence of inflammatory arthropathy. IMPRESSION: 1. Soft tissue swelling and subcutaneous gas concerning for infection, including necrotizing soft tissue infection, dorsal and lateral to the fifth MTP joint. Recommend surgical consultation. 2. No conventional radiographic evidence of osteomyelitis at this time. Electronically Signed   By: Jacqulynn Cadet M.D.   On: 07/30/2018 09:06   Vas [place] Lower Extremity Venous (dvt)  Result Date: 07/29/2018  Lower Venous Study Indications: Swelling, and Pain.  Performing Technologist: Maudry Mayhew MHA, RDMS, RVT, RDCS  Examination Guidelines: A complete evaluation includes B-mode imaging, spectral Doppler, color Doppler, and power Doppler as needed of all accessible portions of each vessel. Bilateral testing is considered an integral part of a complete examination. Limited examinations for reoccurring indications may be performed as noted.  Right Venous Findings: +---------+---------------+---------+-----------+----------+-------+          CompressibilityPhasicitySpontaneityPropertiesSummary +---------+---------------+---------+-----------+----------+-------+ CFV      Full           Yes      Yes                           +---------+---------------+---------+-----------+----------+-------+ SFJ      Full                                                 +---------+---------------+---------+-----------+----------+-------+ FV Prox  Full                                                 +---------+---------------+---------+-----------+----------+-------+ FV Mid   Full                                                 +---------+---------------+---------+-----------+----------+-------+ FV DistalFull                                                 +---------+---------------+---------+-----------+----------+-------+ PFV      Full                                                 +---------+---------------+---------+-----------+----------+-------+ POP      Full           Yes      Yes                          +---------+---------------+---------+-----------+----------+-------+ PTV      Full                                                 +---------+---------------+---------+-----------+----------+-------+  PERO     Full                                                 +---------+---------------+---------+-----------+----------+-------+  Left Venous Findings: +---------+---------------+---------+-----------+----------+-------+          CompressibilityPhasicitySpontaneityPropertiesSummary +---------+---------------+---------+-----------+----------+-------+ CFV      Full           Yes      Yes                          +---------+---------------+---------+-----------+----------+-------+ SFJ      Full                                                 +---------+---------------+---------+-----------+----------+-------+ FV Prox  Full                                                 +---------+---------------+---------+-----------+----------+-------+ FV Mid   Full                                                  +---------+---------------+---------+-----------+----------+-------+ FV DistalFull                                                 +---------+---------------+---------+-----------+----------+-------+ PFV      Full                                                 +---------+---------------+---------+-----------+----------+-------+ POP      Full           Yes      Yes                          +---------+---------------+---------+-----------+----------+-------+ PTV      Full                                                 +---------+---------------+---------+-----------+----------+-------+ PERO     Full                                                 +---------+---------------+---------+-----------+----------+-------+    Summary: Right: There is no evidence of deep vein thrombosis in the lower extremity. No cystic structure found in the popliteal fossa. Left: There is no evidence of deep vein thrombosis in the lower extremity. No cystic structure found in the popliteal fossa. Ultrasound  characteristics of enlarged lymph nodes noted in the groin.  *See table(s) above for measurements and observations. Electronically signed by Ruta Hinds MD on 07/29/2018 at 3:31:40 PM.    Final         Scheduled Meds: . enoxaparin (LOVENOX) injection  40 mg Subcutaneous QHS  . insulin aspart  0-15 Units Subcutaneous TID WC  . insulin aspart  0-5 Units Subcutaneous QHS  . insulin aspart  4 Units Subcutaneous TID WC  . insulin glargine  15 Units Subcutaneous BID  . potassium chloride  40 mEq Oral Q4H  . sodium chloride flush  3 mL Intravenous Q12H   Continuous Infusions: . sodium chloride Stopped (07/30/18 1802)  . 0.9 % NaCl with KCl 20 mEq / L    . cefTRIAXone (ROCEPHIN)  IV Stopped (07/30/18 1331)  . magnesium sulfate 1 - 4 g bolus IVPB    . metronidazole Stopped (07/31/18 0650)  . vancomycin Stopped (07/31/18 0747)     LOS: 1 day    Time spent: 35 minutes.  Bonnell Public, MD [facility] Pager [phone] If 7PM-7AM, please contact night-coverage [email or web address] Password [facility] 07/31/2018, 10:21 AM

## 2018-08-01 DIAGNOSIS — R000 Tachycardia, unspecified: Secondary | ICD-10-CM

## 2018-08-01 LAB — CBC WITH DIFFERENTIAL/PLATELET
Abs Immature Granulocytes: 0.1 10*3/uL — ABNORMAL HIGH (ref 0.00–0.07)
Basophils Absolute: 0 10*3/uL (ref 0.0–0.1)
Basophils Relative: 0 %
Eosinophils Absolute: 0.1 10*3/uL (ref 0.0–0.5)
Eosinophils Relative: 0 %
HCT: 36.6 % — ABNORMAL LOW (ref 39.0–52.0)
Hemoglobin: 12 g/dL — ABNORMAL LOW (ref 13.0–17.0)
Immature Granulocytes: 1 %
Lymphocytes Relative: 15 %
Lymphs Abs: 1.9 10*3/uL (ref 0.7–4.0)
MCH: 30.2 pg (ref 26.0–34.0)
MCHC: 32.8 g/dL (ref 30.0–36.0)
MCV: 92.2 fL (ref 80.0–100.0)
Monocytes Absolute: 1.6 10*3/uL — ABNORMAL HIGH (ref 0.1–1.0)
Monocytes Relative: 13 %
Neutro Abs: 8.6 10*3/uL — ABNORMAL HIGH (ref 1.7–7.7)
Neutrophils Relative %: 71 %
Platelets: 284 10*3/uL (ref 150–400)
RBC: 3.97 MIL/uL — ABNORMAL LOW (ref 4.22–5.81)
RDW: 14.7 % (ref 11.5–15.5)
WBC: 12.3 10*3/uL — ABNORMAL HIGH (ref 4.0–10.5)
nRBC: 0 % (ref 0.0–0.2)

## 2018-08-01 LAB — RENAL FUNCTION PANEL
Albumin: 2.2 g/dL — ABNORMAL LOW (ref 3.5–5.0)
Anion gap: 17 — ABNORMAL HIGH (ref 5–15)
BUN: 6 mg/dL (ref 6–20)
CO2: 18 mmol/L — ABNORMAL LOW (ref 22–32)
Calcium: 7.9 mg/dL — ABNORMAL LOW (ref 8.9–10.3)
Chloride: 99 mmol/L (ref 98–111)
Creatinine, Ser: 0.69 mg/dL (ref 0.61–1.24)
GFR calc Af Amer: >60 mL/min (ref >60)
GFR calc non Af Amer: >60 mL/min (ref >60)
Glucose, Bld: 112 mg/dL — ABNORMAL HIGH (ref 70–99)
Phosphorus: 1.6 mg/dL — ABNORMAL LOW (ref 2.5–4.6)
Potassium: 3.2 mmol/L — ABNORMAL LOW (ref 3.5–5.1)
Sodium: 134 mmol/L — ABNORMAL LOW (ref 135–145)

## 2018-08-01 LAB — GLUCOSE, CAPILLARY
Glucose-Capillary: 153 mg/dL — ABNORMAL HIGH (ref 70–99)
Glucose-Capillary: 159 mg/dL — ABNORMAL HIGH (ref 70–99)
Glucose-Capillary: 216 mg/dL — ABNORMAL HIGH (ref 70–99)
Glucose-Capillary: 148 mg/dL — ABNORMAL HIGH (ref 70–99)

## 2018-08-01 LAB — BASIC METABOLIC PANEL
Anion gap: 12 (ref 5–15)
BUN: 5 mg/dL — AB (ref 6–20)
CO2: 22 mmol/L (ref 22–32)
Calcium: 7.8 mg/dL — ABNORMAL LOW (ref 8.9–10.3)
Chloride: 99 mmol/L (ref 98–111)
Creatinine, Ser: 0.76 mg/dL (ref 0.61–1.24)
GFR calc Af Amer: >60 mL/min (ref >60)
GFR calc non Af Amer: >60 mL/min (ref >60)
Glucose, Bld: 109 mg/dL — ABNORMAL HIGH (ref 70–99)
Potassium: 3.2 mmol/L — ABNORMAL LOW (ref 3.5–5.1)
Sodium: 133 mmol/L — ABNORMAL LOW (ref 135–145)

## 2018-08-01 LAB — MAGNESIUM: Magnesium: 2.1 mg/dL (ref 1.7–2.4)

## 2018-08-01 MED ORDER — METOPROLOL TARTRATE 5 MG/5ML IV SOLN
2.5000 mg | Freq: Once | INTRAVENOUS | Status: DC
  Filled 2018-08-01: qty 5

## 2018-08-01 MED ORDER — POTASSIUM PHOSPHATES 15 MMOLE/5ML IV SOLN
20.0000 mmol | Freq: Three times a day (TID) | INTRAVENOUS | Status: AC
  Administered 2018-08-01 (×2): 20 mmol via INTRAVENOUS
  Filled 2018-08-01 (×2): qty 6.67

## 2018-08-01 MED ORDER — POTASSIUM CHLORIDE CRYS ER 20 MEQ PO TBCR
40.0000 meq | EXTENDED_RELEASE_TABLET | ORAL | Status: AC
  Administered 2018-08-01 (×2): 40 meq via ORAL
  Filled 2018-08-01 (×2): qty 2

## 2018-08-01 MED ORDER — SODIUM CHLORIDE 0.9 % IV SOLN
2.0000 g | Freq: Three times a day (TID) | INTRAVENOUS | Status: DC
  Administered 2018-08-01 – 2018-08-02 (×3): 2 g via INTRAVENOUS
  Filled 2018-08-01 (×4): qty 2

## 2018-08-01 NOTE — Progress Notes (Signed)
Patient ID: Taylor Hughes, male   DOB: Oct 13, 1970, 47 y.o.   MRN: [id number] Patient seen at the bedside this am and I changed the dressing on his left foot.  There is some slight drainage to be expected given the extent of his infection.  The cultures did grow out gram positive cocci and gram negative rods.  He is currently on Flagyl and Rocephin.  I'm not sure if these are the correct antibiotics for coverage.  The primary team may want to consult ID.  He is still tachycardic and his WBC is just over 12.  He will need IV antibiotics for a few more days until he improves clinically.  Will follow.

## 2018-08-01 NOTE — Progress Notes (Signed)
Paged Dr. Marthenia Rolling twice this am about the patient having a elevated HR.  No return of page currently.  Will continue to page the doctor about the elevated HR.  Bethania Schlotzhauer Roselie Awkward RN

## 2018-08-01 NOTE — Progress Notes (Signed)
Heart rate of 140, Lopressor 2.5 given as ordered. Notified [facility]ad NP, new order for one time dose of Lopressor 2.5, Heart rate remains 140, BP 111/65. Patient  denies chest pain, shortness of breath and reports no palpitations. Education provided for extra dose of Lopressor and patient refused. States " I feel fine and my heart rate will come back down, maybe I am receiving to much medication.

## 2018-08-01 NOTE — Progress Notes (Signed)
Patient ID: Taylor Hughes, male   DOB: 08-16-1970, 47 y.o.   MRN: [id number]  PROGRESS NOTE    Taylor Hughes  SEG:[id number] DOB: [birth year] DOA: 07/27/2018 PCP: Jilda Panda, MD   Brief Narrative:  47 year old male with history of diabetes mellitus type 2 on insulin presented with nausea, vomiting and chills.  He was unable to take his insulin over the last 5 days.  He was found to be in DKA and started on insulin drip along with intravenous fluids.  07/31/2018: Patient underwent I&D of the diabetic left foot abscess yesterday.  Postop, patient became hypotensive and tachycardic.  Hypotension has resolved.  Tachycardia has resolved significantly.  We will continue fluid resuscitation.  Will replete abnormal electrolytes, specifically, potassium and magnesium.  Will change subcutaneous Levemir to subcutaneous Lantus 15 units twice daily.  Will continue to monitor renal function and electrolytes.  We will continue to optimize blood sugar management.  Input from the surgical team is highly appreciated.  Otherwise, patient has no new complaints.  08/01/2018: Patient continues to improve.  However, leukocytosis persists.  Potassium is 3.2, phosphorus is 1.6.  Will replete abnormal electrolytes.  We will adjust patient's antibiotics.  Will discontinue Rocephin and Flagyl.  We will continue vancomycin.  Will start patient on IV cefepime.  Will follow culture results.  Orthopedic input is highly appreciated.  Mild tachycardia persists.  Assessment & Plan:   Principal Problem:   DKA, type 2 ([place]) Active Problems:   Leukocytosis   Hyperkalemia   Hyponatremia   DKA (diabetic ketoacidoses) (H[facility])   Left foot infection  DKA in a patient with history of diabetes mellitus type 2 uncontrolled -Initially started on insulin drip.  After anion gap closed, switched to long-acting insulin and insulin drip was subsequently discontinued.   -Blood sugars improving but still on the higher side.  Increase Levemir to 25  units subcutaneous daily.  Increase NovoLog with meals to 6 units 3 times a day.  Continue Accu-Cheks with sliding scale coverage.  Diabetes coordinator following.  -care management consult for insulin needs -Feels that his appetite is better this morning. -Hemoglobin A1c is 9.6. 07/31/2018: DKA has resolved.  Continue sliding scale insulin coverage.  Change Levemir to Lantus. 08/01/2018: Blood sugar control has improved significantly.  Left foot cellulitis/diabetic foot infection with ulcer -Left foot x-ray shows soft tissue swelling along with subcutaneous gas concerning for infection, including necrotizing soft tissue infection. -Dr. Blackman/orthopedics consulted.  Will keep n.p.o.  Start Rocephin, Flagyl and vancomycin. -Still febrile intermittently with sinus tachycardia.  Discontinue IV fluids as patient complains of bilateral legs swelling.  Lower extremity duplex was negative for DVT in bilateral lower extremity. 07/31/2018: Patient is status post I&D.  Patient became hypotensive and tachycardic afterwards.  Continue aggressive management for possible sepsis/Sirs that is now resolving. 08/01/2018: Continue postop management as per orthopedic team.  Patient will need wound care on discharge.  Will consult case management.  Will adjust patient's antibiotics.  Will discontinue Rocephin and Flagyl.  Will start patient on IV cefepime.  Possible sepsis/SIRS: See above.  Hypokalemia -Replete.   -Continue to monitor.    Noncompliance: Counseled  Sinus tachycardia: Continue to treat underlying infection. Optimize volume status. Avoid hypotension.  DVT prophylaxis: Lovenox Code Status: Full Family Communication: None at bedside Disposition Plan: Depends on further evaluation by orthopedics  consultants: Orthopedics  Procedures: None  Antimicrobials:  Started Rocephin, Flagyl and vancomycin on 07/30/2018 08/01/2018: Discontinue Rocephin and Flagyl.  Start IV  cefepime.  Subjective: No  new complaints. No fever chills No chest pain No shortness of breath   Objective: Vitals:   08/01/18 0625 08/01/18 0700 08/01/18 0800 08/01/18 1000  BP: (!) 102/51 (!) 84/50 (!) 94/50 110/65  Pulse: (!) 140  (!) 139 (!) 101  Resp: 10 16 (!) 24 (!) 23  Temp:   98.7 F (37.1 C)   TempSrc:   Oral   SpO2: 98%  98% 100%  Weight:      Height:        Intake/Output Summary (Last 24 hours) at 08/01/2018 1157 Last data filed at 08/01/2018 1000 Gross per 24 hour  Intake 2239.54 ml  Output 1100 ml  Net 1139.54 ml   Filed Weights   07/27/18 2136  Weight: 111.1 kg    Examination:  General exam: Appears calm and comfortable, no acute distress.  Obese. Respiratory system: Clear to auscultation.   Cardiovascular system: S1-S2, tachycardia.   Gastrointestinal system: Abdomen is obese, soft and nontender.  Organs are difficult to assess.   Extremities: Chronically looking edema.  Left foot is bandaged.   CNS: Alert, awake.  Moving extremities.  No focal neuro deficit  Data Reviewed: I have personally reviewed following labs and imaging studies  CBC: Recent Labs  Lab 07/27/18 2216 07/28/18 0518 07/29/18 0328 07/30/18 0254 07/31/18 0316 08/01/18 0324  WBC 14.7* 14.3* 14.8* 10.2 11.3* 12.3*  NEUTROABS 12.9*  --  12.1* 8.1* 9.0* 8.6*  HGB 13.3 12.6* 13.7 12.8* 12.1* 12.0*  HCT 42.9 39.6 43.0 38.9* 37.0* 36.6*  MCV 94.3 95.2 90.3 88.8 88.3 92.2  PLT 261 252 268 288 314 425   Basic Metabolic Panel: Recent Labs  Lab 07/28/18 0843 07/29/18 0328 07/30/18 0254 07/31/18 0316 08/01/18 0324  NA 132* 131* 134* 134* 134*  133*  K 3.9 3.7 3.7 2.8* 3.2*  3.2*  CL 106 100 105 100 99  99  CO2 16* 16* 20* 20* 18*  22  GLUCOSE 164* 255* 159* 191* 112*  109*  BUN 15 10 8 8 6   5*  CREATININE 0.84 0.76 0.65 0.71 0.69  0.76  CALCIUM 8.8* 8.8* 8.0* 7.7* 7.9*  7.8*  MG  --  2.0 2.0 1.6* 2.1  PHOS  --   --   --   --  1.6*   GFR: Estimated Creatinine  Clearance: 146.2 mL/min (by C-G formula based on SCr of 0.76 mg/dL). Liver Function Tests: Recent Labs  Lab 07/27/18 2216 08/01/18 0324  AST 18  --   ALT 20  --   ALKPHOS 75  --   BILITOT 1.7*  --   PROT 8.3*  --   ALBUMIN 3.5 2.2*   Recent Labs  Lab 07/27/18 2316  LIPASE 24   No results for input(s): AMMONIA in the last 168 hours. Coagulation Profile: No results for input(s): INR, PROTIME in the last 168 hours. Cardiac Enzymes: Recent Labs  Lab 07/27/18 2316  TROPONINI <0.03   BNP (last 3 results) No results for input(s): PROBNP in the last 8760 hours. HbA1C: No results for input(s): HGBA1C in the last 72 hours. CBG: Recent Labs  Lab 07/31/18 0812 07/31/18 1126 07/31/18 1626 07/31/18 2158 08/01/18 0805  GLUCAP 253* 172* 142* 92 153*   Lipid Profile: No results for input(s): CHOL, HDL, LDLCALC, TRIG, CHOLHDL, LDLDIRECT in the last 72 hours. Thyroid Function Tests: No results for input(s): TSH, T4TOTAL, FREET4, T3FREE, THYROIDAB in the last 72 hours. Anemia Panel: No results for input(s): VITAMINB12, FOLATE, FERRITIN, TIBC, IRON, RETICCTPCT in  the last 72 hours. Sepsis Labs: Recent Labs  Lab 07/27/18 2226 07/28/18 0011  LATICACIDVEN 1.38 1.27    Recent Results (from the past 240 hour(s))  MRSA PCR Screening     Status: None   Collection Time: 07/28/18  2:06 AM  Result Value Ref Range Status   MRSA by PCR NEGATIVE NEGATIVE Final    Comment:        The GeneXpert MRSA Assay (FDA approved for NASAL specimens only), is one component of a comprehensive MRSA colonization surveillance program. It is not intended to diagnose MRSA infection nor to guide or monitor treatment for MRSA infections. Performed at [facility], [place] [address] [place], [place] [address]   Respiratory Panel by PCR     Status: Abnormal   Collection Time: 07/28/18  6:38 AM  Result Value Ref Range Status   Adenovirus NOT DETECTED NOT DETECTED Final    Coronavirus 229E NOT DETECTED NOT DETECTED Final   Coronavirus HKU1 NOT DETECTED NOT DETECTED Final   Coronavirus NL63 NOT DETECTED NOT DETECTED Final   Coronavirus OC43 NOT DETECTED NOT DETECTED Final   Metapneumovirus NOT DETECTED NOT DETECTED Final   Rhinovirus / Enterovirus NOT DETECTED NOT DETECTED Final   Influenza A NOT DETECTED NOT DETECTED Final   Influenza B NOT DETECTED NOT DETECTED Final   Parainfluenza Virus 1 NOT DETECTED NOT DETECTED Final   Parainfluenza Virus 2 NOT DETECTED NOT DETECTED Final   Parainfluenza Virus 3 NOT DETECTED NOT DETECTED Final   Parainfluenza Virus 4 DETECTED (A) NOT DETECTED Final   Respiratory Syncytial Virus NOT DETECTED NOT DETECTED Final   Bordetella pertussis NOT DETECTED NOT DETECTED Final   Chlamydophila pneumoniae NOT DETECTED NOT DETECTED Final   Mycoplasma pneumoniae NOT DETECTED NOT DETECTED Final    Comment: Performed at [facility], [place] [address] [place], [place] [address]  Culture, blood (routine x 2)     Status: None (Preliminary result)   Collection Time: 07/28/18  7:36 PM  Result Value Ref Range Status   Specimen Description   Final    BLOOD LEFT ANTECUBITAL Performed at [place] [address] [place] [address] [place], [place] [address]    Special Requests   Final    BOTTLES DRAWN AEROBIC AND ANAEROBIC Blood Culture adequate volume Performed at [place] [address], [place], [place] [address]    Culture   Final    NO GROWTH 4 DAYS Performed at [facility], [place] [address], [place], [place] [address]    Report Status PENDING  Incomplete  Culture, blood (routine x 2)     Status: None (Preliminary result)   Collection Time: 07/28/18  7:42 PM  Result Value Ref Range Status   Specimen Description   Final    BLOOD RIGHT HAND Performed at [place] [address] [place], [place] [address]    Special Requests   Final    BOTTLES DRAWN AEROBIC AND  ANAEROBIC Blood Culture adequate volume Performed at [place] [address] [place], [place] [address]    Culture   Final    NO GROWTH 4 DAYS Performed at [facility], [place] [address], [place], [place] [address]    Report Status PENDING  Incomplete  Culture, Urine     Status: None   Collection Time: 07/29/18 12:15 PM  Result Value Ref Range Status   Specimen Description   Final    URINE,  CLEAN CATCH Performed at [facility], [place] [address], [place], [place] [address]    Special Requests   Final    NONE Performed at [facility], [place] [address], [place], [place] [address]    Culture   Final    NO GROWTH Performed at [facility], [place] [address] [place], [place] [address]    Report Status 07/30/2018 FINAL  Final  Aerobic/Anaerobic Culture (surgical/deep wound)     Status: None (Preliminary result)   Collection Time: 07/30/18  6:59 PM  Result Value Ref Range Status   Specimen Description ABSCESS LEFT FOOT  Final   Special Requests   Final    NONE Performed at [place] [address], [place], [place] [address]    Gram Stain   Final    NO WBC SEEN ABUNDANT GRAM POSITIVE COCCI ABUNDANT GRAM NEGATIVE RODS    Culture PENDING  Incomplete   Report Status PENDING  Incomplete         Radiology Studies: No results found.      Scheduled Meds: . enoxaparin (LOVENOX) injection  40 mg Subcutaneous QHS  . insulin aspart  0-15 Units Subcutaneous TID WC  . insulin aspart  0-5 Units Subcutaneous QHS  . insulin aspart  4 Units Subcutaneous TID WC  . insulin glargine  15 Units Subcutaneous BID  . metoprolol tartrate  2.5 mg Intravenous Once  . potassium chloride  40 mEq Oral Q4H  . sodium chloride flush  3 mL Intravenous Q12H   Continuous Infusions: . sodium chloride Stopped (07/30/18 1802)  . 0.9 % NaCl with KCl 20 mEq / L 75 mL/hr at 08/01/18 0024  . cefTRIAXone  (ROCEPHIN)  IV 2 g (08/01/18 1144)  . metronidazole 500 mg (08/01/18 0535)  . potassium PHOSPHATE IVPB (in mmol)    . vancomycin 1,000 mg (08/01/18 0533)     LOS: 2 days    Time spent: 35 minutes.  Bonnell Public, MD [facility] Pager [phone] If 7PM-7AM, please contact night-coverage [email or web address] Password [email or web address] 08/01/2018, 11:57 AM

## 2018-08-01 NOTE — Progress Notes (Signed)
Pharmacy Antibiotic Note  Taylor Hughes is a 47 y.o. male admitted on 07/27/2018 with DKA.  Pharmacy has been consulted for vancomycin dosing for cellulitis of L foot.  Tmax 102.3, WBC down to WNL. SCr WNL. Wt 111 kg. Left foot xray shows soft tissue swelling along with subcutaneous gas concerning for infection, including necrotizing soft tissue infection. Dr. Blackman/orthopedics consulted.  12/22 Pt is being transitioned from ceftriaxone to cefepime. Metronidazole also stopped  Plan: Continue  vancomycin 1000 mg IV q8h (using SCr 0.7, IBW/ABW)  Cefepime 2 gr IV q8h  F/u renal fxn, WBC, temp, culture data, clinical course Vancomycin levels as needed  Height: 5\' 11"  (180.3 cm) Weight: 245 lb (111.1 kg) IBW/kg (Calculated) : 75.3  Temp (24hrs), Avg:99.3 F (37.4 C), Min:98.7 F (37.1 C), Max:100.2 F (37.9 C)  Recent Labs  Lab 07/27/18 2226 07/28/18 0011  07/28/18 0518 07/28/18 0843 07/29/18 0328 07/30/18 0254 07/31/18 0316 08/01/18 0324  WBC  --   --   --  14.3*  --  14.8* 10.2 11.3* 12.3*  CREATININE  --   --    < > 0.92 0.84 0.76 0.65 0.71 0.69  0.76  LATICACIDVEN 1.38 1.27  --   --   --   --   --   --   --    < > = values in this interval not displayed.    Estimated Creatinine Clearance: 146.2 mL/min (by C-G formula based on SCr of 0.76 mg/dL).    No Known Allergies  Antimicrobials this admission: 12/20 vanc>> 12/20 ceftriaxone>> 12/22 12/20 flagyl>> 12/22 12/22 cefepime >>   Dose adjustments this admission:  Microbiology results: 12/19 UCx>NGF 12/18 BCx2>>ngtd 12/18 resp panel> + for parainfluenza virus 4 12/18 MRSA PCR neg 12/18 HIV neg 12/20 L foot abscess>>abun GP cocci. Abun GNR  Thank you for allowing pharmacy to be a part of this patient's care.  Eudelia Bunch, Pharm.D [phone] 08/01/2018 12:08 PM

## 2018-08-02 ENCOUNTER — Inpatient Hospital Stay (HOSPITAL_COMMUNITY): Payer: Self-pay

## 2018-08-02 DIAGNOSIS — E10621 Type 1 diabetes mellitus with foot ulcer: Secondary | ICD-10-CM

## 2018-08-02 DIAGNOSIS — L97525 Non-pressure chronic ulcer of other part of left foot with muscle involvement without evidence of necrosis: Secondary | ICD-10-CM

## 2018-08-02 DIAGNOSIS — A401 Sepsis due to streptococcus, group B: Secondary | ICD-10-CM

## 2018-08-02 DIAGNOSIS — R6510 Systemic inflammatory response syndrome (SIRS) of non-infectious origin without acute organ dysfunction: Secondary | ICD-10-CM

## 2018-08-02 LAB — RENAL FUNCTION PANEL
Albumin: 2.1 g/dL — ABNORMAL LOW (ref 3.5–5.0)
Anion gap: 15 (ref 5–15)
BUN: 8 mg/dL (ref 6–20)
CO2: 18 mmol/L — ABNORMAL LOW (ref 22–32)
Calcium: 7.7 mg/dL — ABNORMAL LOW (ref 8.9–10.3)
Chloride: 100 mmol/L (ref 98–111)
Creatinine, Ser: 1.08 mg/dL (ref 0.61–1.24)
GFR calc Af Amer: >60 mL/min (ref >60)
GFR calc non Af Amer: >60 mL/min (ref >60)
Glucose, Bld: 221 mg/dL — ABNORMAL HIGH (ref 70–99)
Phosphorus: 2.9 mg/dL (ref 2.5–4.6)
Potassium: 3.5 mmol/L (ref 3.5–5.1)
Sodium: 133 mmol/L — ABNORMAL LOW (ref 135–145)

## 2018-08-02 LAB — CBC WITH DIFFERENTIAL/PLATELET
Abs Immature Granulocytes: 0.07 10*3/uL (ref 0.00–0.07)
Basophils Absolute: 0 10*3/uL (ref 0.0–0.1)
Basophils Relative: 0 %
Eosinophils Absolute: 0.1 10*3/uL (ref 0.0–0.5)
Eosinophils Relative: 1 %
HCT: 37.6 % — ABNORMAL LOW (ref 39.0–52.0)
Hemoglobin: 11.9 g/dL — ABNORMAL LOW (ref 13.0–17.0)
Immature Granulocytes: 1 %
Lymphocytes Relative: 19 %
Lymphs Abs: 1.6 10*3/uL (ref 0.7–4.0)
MCH: 29.6 pg (ref 26.0–34.0)
MCHC: 31.6 g/dL (ref 30.0–36.0)
MCV: 93.5 fL (ref 80.0–100.0)
Monocytes Absolute: 1.2 10*3/uL — ABNORMAL HIGH (ref 0.1–1.0)
Monocytes Relative: 14 %
Neutro Abs: 5.5 10*3/uL (ref 1.7–7.7)
Neutrophils Relative %: 65 %
Platelets: 361 10*3/uL (ref 150–400)
RBC: 4.02 MIL/uL — ABNORMAL LOW (ref 4.22–5.81)
RDW: 15 % (ref 11.5–15.5)
WBC: 8.5 10*3/uL (ref 4.0–10.5)
nRBC: 0 % (ref 0.0–0.2)

## 2018-08-02 LAB — CULTURE, BLOOD (ROUTINE X 2)
Culture: NO GROWTH
Culture: NO GROWTH
Special Requests: ADEQUATE
Special Requests: ADEQUATE

## 2018-08-02 LAB — GLUCOSE, CAPILLARY
Glucose-Capillary: 216 mg/dL — ABNORMAL HIGH (ref 70–99)
Glucose-Capillary: 176 mg/dL — ABNORMAL HIGH (ref 70–99)
Glucose-Capillary: 108 mg/dL — ABNORMAL HIGH (ref 70–99)
Glucose-Capillary: 165 mg/dL — ABNORMAL HIGH (ref 70–99)

## 2018-08-02 LAB — MAGNESIUM: Magnesium: 2 mg/dL (ref 1.7–2.4)

## 2018-08-02 MED ORDER — POTASSIUM CHLORIDE CRYS ER 20 MEQ PO TBCR
40.0000 meq | EXTENDED_RELEASE_TABLET | Freq: Once | ORAL | Status: AC
  Administered 2018-08-02: 40 meq via ORAL
  Filled 2018-08-02: qty 2

## 2018-08-02 MED ORDER — DILTIAZEM HCL 25 MG/5ML IV SOLN
10.0000 mg | Freq: Once | INTRAVENOUS | Status: AC
  Administered 2018-08-02: 10 mg via INTRAVENOUS
  Filled 2018-08-02: qty 5

## 2018-08-02 MED ORDER — FAMOTIDINE 20 MG PO TABS
20.0000 mg | ORAL_TABLET | Freq: Every day | ORAL | Status: DC
  Administered 2018-08-02 – 2018-08-04 (×3): 20 mg via ORAL
  Filled 2018-08-02 (×3): qty 1

## 2018-08-02 MED ORDER — GADOBUTROL 1 MMOL/ML IV SOLN: 10.0000 mL | Freq: Once | INTRAVENOUS | Status: DC | PRN

## 2018-08-02 MED ORDER — VANCOMYCIN VARIABLE DOSE PER UNSTABLE RENAL FUNCTION (PHARMACIST DOSING): Status: DC

## 2018-08-02 MED ORDER — SODIUM CHLORIDE 0.9 % IV SOLN
3.0000 g | Freq: Four times a day (QID) | INTRAVENOUS | Status: DC
  Administered 2018-08-02 – 2018-08-05 (×12): 3 g via INTRAVENOUS
  Filled 2018-08-02 (×15): qty 3

## 2018-08-02 NOTE — Progress Notes (Signed)
Patient ID: Taylor Hughes, male   DOB: 1970/11/27, 47 y.o.   MRN: [id number] I came to the bedside to assess the patient's left foot.  He is now post-op day 3 from I&D of a significant abscess involving his left foot. We found an abundant amount of gross purulence in the soft tissue.  His plain films showed no destruction of the bone to suggest osteomyelitis.  The patient has been significantly tachycardic.  He is alert and oriented and does not appear septic.  I did change his dressing on the left foot and the swelling and redness have decreased significantly.  I expressed some edema fluid from the incision site, but no purulence.  There primary service is ordering an urgent MRI of his left foot to assess for any further abscess or findings that are causing his tachycardia and hypotension.  I'll follow-up on this study.  Based on his current foot exam, I do not see a need to return emergently to surgery.

## 2018-08-02 NOTE — Progress Notes (Signed)
Inpatient Diabetes Program Recommendations  AACE/ADA: New Consensus Statement on Inpatient Glycemic Control (2015)  Target Ranges:  Prepandial:   less than 140 mg/dL      Peak postprandial:   less than 180 mg/dL (1-2 hours)      Critically ill patients:  140 - 180 mg/dL   Lab Results  Component Value Date   GLUCAP 216 (H) 08/02/2018   HGBA1C 9.6 (H) 07/28/2018    Review of Glycemic Control  FBS - 221, 216. Needs tighter glucose control for healing.  Inpatient Diabetes Program Recommendations:     Increase Lantus to 16 units bid. Continue to titrate until FBS > 180 mg/dL.  Post-prandial blood sugars look acceptable. Continue to follow.  Thank you. Lorenda Peck, RD, LDN, CDE Inpatient Diabetes Coordinator [phone]

## 2018-08-02 NOTE — Progress Notes (Signed)
Patient ID: Taylor Hughes, male   DOB: [birth year], 47 y.o.   MRN: [id number]  PROGRESS NOTE    Taylor Hughes  SWN:[id number] DOB: 1971-05-08 DOA: 07/27/2018 PCP: Jilda Panda, MD   Brief Narrative:  47 year old male with history of diabetes mellitus type 2 on insulin presented with nausea, vomiting and chills.  He was unable to take his insulin over the last 5 days.  He was found to be in DKA and started on insulin drip along with intravenous fluids.  07/31/2018: Patient underwent I&D of the diabetic left foot abscess yesterday.  Postop, patient became hypotensive and tachycardic.  Hypotension has resolved.  Tachycardia has resolved significantly.  We will continue fluid resuscitation.  Will replete abnormal electrolytes, specifically, potassium and magnesium.  Will change subcutaneous Levemir to subcutaneous Lantus 15 units twice daily.  Will continue to monitor renal function and electrolytes.  We will continue to optimize blood sugar management.  Input from the surgical team is highly appreciated.  Otherwise, patient has no new complaints.  08/01/2018: Patient continues to improve.  However, leukocytosis persists.  Potassium is 3.2, phosphorus is 1.6.  Will replete abnormal electrolytes.  We will adjust patient's antibiotics.  Will discontinue Rocephin and Flagyl.  We will continue vancomycin.  Will start patient on IV cefepime.  Will follow culture results.  Orthopedic input is highly appreciated.  Mild tachycardia persists.  Assessment & Plan:   Principal Problem:   DKA, type 2 ([place]) Active Problems:   Leukocytosis   Hyperkalemia   Hyponatremia   DKA (diabetic ketoacidoses) ([facility])   Left foot infection  DKA in a patient with history of diabetes mellitus type 2 uncontrolled -Initially started on insulin drip.  After anion gap closed, switched to long-acting insulin and insulin drip was subsequently discontinued.   -Blood sugars improving but still on the higher side.  Increase Levemir to 25  units subcutaneous daily.  Increase NovoLog with meals to 6 units 3 times a day.  Continue Accu-Cheks with sliding scale coverage.  Diabetes coordinator following.  -care management consult for insulin needs -Feels that his appetite is better this morning. -Hemoglobin A1c is 9.6. 07/31/2018: DKA has resolved.  Continue sliding scale insulin coverage.  Change Levemir to Lantus. 08/02/2018: Blood sugar control has improved significantly.  Continue current regimen.  Left foot cellulitis/diabetic foot infection with ulcer -Left foot x-ray shows soft tissue swelling along with subcutaneous gas concerning for infection, including necrotizing soft tissue infection. -Dr. Blackman/orthopedics consulted.  Will keep n.p.o.  Start Rocephin, Flagyl and vancomycin. -Still febrile intermittently with sinus tachycardia.  Discontinue IV fluids as patient complains of bilateral legs swelling.  Lower extremity duplex was negative for DVT in bilateral lower extremity. 07/31/2018: Patient is status post I&D.  Patient became hypotensive and tachycardic afterwards.  Continue aggressive management for possible sepsis/Sirs that is now resolving. 08/01/2018: Continue postop management as per orthopedic team.   08/02/2018: Wound culture is growing group B.  Change antibiotics to IV Unasyn.    Possible sepsis/SIRS: Patient is still tachycardic and intermittently hypotensive. Patient is still febrile, however, no leukocytosis. MRI of the foot. Discussed above findings with the orthopedic team, Dr. Zollie Beckers.  Hypokalemia -Replete.   -Continue to monitor.    Noncompliance: Counseled  Sinus tachycardia: Continue to treat underlying infection. Optimize volume status. Avoid hypotension.  DVT prophylaxis: Lovenox Code Status: Full Family Communication: None at bedside Disposition Plan: Depends on further evaluation by orthopedics  consultants: Orthopedics  Procedures: None  Antimicrobials:  Started  Rocephin, Flagyl  and vancomycin on 07/30/2018 08/01/2018: Discontinue Rocephin and Flagyl.  Start IV cefepime.  Subjective: No new complaints. No chest pain No shortness of breath Fever is documented.    Objective: Vitals:   08/02/18 2012 08/02/18 2100 08/02/18 2200 08/02/18 2344  BP: 104/71 102/61 98/75   Pulse: 60 (!) 154 (!) 152   Resp: (!) 23 (!) 27 (!) 30   Temp:    99 F (37.2 C)  TempSrc:    Oral  SpO2: (!) 88% 95% 97%   Weight:      Height:        Intake/Output Summary (Last 24 hours) at 08/02/2018 2345 Last data filed at 08/02/2018 2216 Gross per 24 hour  Intake 2314.59 ml  Output 1175 ml  Net 1139.59 ml   Filed Weights   07/27/18 2136  Weight: 111.1 kg    Examination:  General exam: Appears calm and comfortable, no acute distress.  Obese. Respiratory system: Clear to auscultation.   Cardiovascular system: S1-S2, tachycardia.   Gastrointestinal system: Abdomen is obese, soft and nontender.  Organs are difficult to assess.   Extremities: Chronically looking edema.  Left foot is bandaged.   CNS: Alert, awake.  Moving extremities.  No focal neuro deficit  Data Reviewed: I have personally reviewed following labs and imaging studies  CBC: Recent Labs  Lab 07/29/18 0328 07/30/18 0254 07/31/18 0316 08/01/18 0324 08/02/18 0235  WBC 14.8* 10.2 11.3* 12.3* 8.5  NEUTROABS 12.1* 8.1* 9.0* 8.6* 5.5  HGB 13.7 12.8* 12.1* 12.0* 11.9*  HCT 43.0 38.9* 37.0* 36.6* 37.6*  MCV 90.3 88.8 88.3 92.2 93.5  PLT 268 288 314 284 353   Basic Metabolic Panel: Recent Labs  Lab 07/29/18 0328 07/30/18 0254 07/31/18 0316 08/01/18 0324 08/02/18 0235  NA 131* 134* 134* 134*  133* 133*  K 3.7 3.7 2.8* 3.2*  3.2* 3.5  CL 100 105 100 99  99 100  CO2 16* 20* 20* 18*  22 18*  GLUCOSE 255* 159* 191* 112*  109* 221*  BUN 10 8 8 6   5* 8  CREATININE 0.76 0.65 0.71 0.69  0.76 1.08  CALCIUM 8.8* 8.0* 7.7* 7.9*  7.8* 7.7*  MG 2.0 2.0 1.6* 2.1 2.0  PHOS  --   --   --   1.6* 2.9   GFR: Estimated Creatinine Clearance: 108.3 mL/min (by C-G formula based on SCr of 1.08 mg/dL). Liver Function Tests: Recent Labs  Lab 07/27/18 2216 08/01/18 0324 08/02/18 0235  AST 18  --   --   ALT 20  --   --   ALKPHOS 75  --   --   BILITOT 1.7*  --   --   PROT 8.3*  --   --   ALBUMIN 3.5 2.2* 2.1*   Recent Labs  Lab 07/27/18 2316  LIPASE 24   No results for input(s): AMMONIA in the last 168 hours. Coagulation Profile: No results for input(s): INR, PROTIME in the last 168 hours. Cardiac Enzymes: Recent Labs  Lab 07/27/18 2316  TROPONINI <0.03   BNP (last 3 results) No results for input(s): PROBNP in the last 8760 hours. HbA1C: No results for input(s): HGBA1C in the last 72 hours. CBG: Recent Labs  Lab 08/01/18 2144 08/02/18 0748 08/02/18 1124 08/02/18 1649 08/02/18 2155  GLUCAP 148* 216* 176* 108* 165*   Lipid Profile: No results for input(s): CHOL, HDL, LDLCALC, TRIG, CHOLHDL, LDLDIRECT in the last 72 hours. Thyroid Function Tests: No results for input(s): TSH, T4TOTAL, FREET4, T3FREE,  THYROIDAB in the last 72 hours. Anemia Panel: No results for input(s): VITAMINB12, FOLATE, FERRITIN, TIBC, IRON, RETICCTPCT in the last 72 hours. Sepsis Labs: Recent Labs  Lab 07/27/18 2226 07/28/18 0011  LATICACIDVEN 1.38 1.27    Recent Results (from the past 240 hour(s))  MRSA PCR Screening     Status: None   Collection Time: 07/28/18  2:06 AM  Result Value Ref Range Status   MRSA by PCR NEGATIVE NEGATIVE Final    Comment:        The GeneXpert MRSA Assay (FDA approved for NASAL specimens only), is one component of a comprehensive MRSA colonization surveillance program. It is not intended to diagnose MRSA infection nor to guide or monitor treatment for MRSA infections. Performed at [facility], [place] [address], [place], [place] [address]   Respiratory Panel by PCR     Status: Abnormal   Collection Time: 07/28/18  6:38 AM    Result Value Ref Range Status   Adenovirus NOT DETECTED NOT DETECTED Final   Coronavirus 229E NOT DETECTED NOT DETECTED Final   Coronavirus HKU1 NOT DETECTED NOT DETECTED Final   Coronavirus NL63 NOT DETECTED NOT DETECTED Final   Coronavirus OC43 NOT DETECTED NOT DETECTED Final   Metapneumovirus NOT DETECTED NOT DETECTED Final   Rhinovirus / Enterovirus NOT DETECTED NOT DETECTED Final   Influenza A NOT DETECTED NOT DETECTED Final   Influenza B NOT DETECTED NOT DETECTED Final   Parainfluenza Virus 1 NOT DETECTED NOT DETECTED Final   Parainfluenza Virus 2 NOT DETECTED NOT DETECTED Final   Parainfluenza Virus 3 NOT DETECTED NOT DETECTED Final   Parainfluenza Virus 4 DETECTED (A) NOT DETECTED Final   Respiratory Syncytial Virus NOT DETECTED NOT DETECTED Final   Bordetella pertussis NOT DETECTED NOT DETECTED Final   Chlamydophila pneumoniae NOT DETECTED NOT DETECTED Final   Mycoplasma pneumoniae NOT DETECTED NOT DETECTED Final    Comment: Performed at [facility], [place] [address] [place], [place] [address]  Culture, blood (routine x 2)     Status: None   Collection Time: 07/28/18  7:36 PM  Result Value Ref Range Status   Specimen Description   Final    BLOOD LEFT ANTECUBITAL Performed at [facility] [address] [place], [place] [address]    Special Requests   Final    BOTTLES DRAWN AEROBIC AND ANAEROBIC Blood Culture adequate volume Performed at [place] [address] [place], [place] [address]    Culture   Final    NO GROWTH 5 DAYS Performed at [facility], [place] [address], [place], [place] [address]    Report Status 08/02/2018 FINAL  Final  Culture, blood (routine x 2)     Status: None   Collection Time: 07/28/18  7:42 PM  Result Value Ref Range Status   Specimen Description   Final    BLOOD RIGHT HAND Performed at [place] [address] [place], [place] [address]    Special Requests    Final    BOTTLES DRAWN AEROBIC AND ANAEROBIC Blood Culture adequate volume Performed at [place] [address], [place], [place] [address]    Culture   Final    NO GROWTH 5 DAYS Performed at [facility], [place] [address] [place], [place] [address]    Report Status 08/02/2018 FINAL  Final  Culture, Urine     Status: None   Collection Time: 07/29/18 12:15  PM  Result Value Ref Range Status   Specimen Description   Final    URINE, CLEAN CATCH Performed at [facility], [place] [address], [place], [place] [address]    Special Requests   Final    NONE Performed at [facility], [place] [address], [place], [place] [address]    Culture   Final    NO GROWTH Performed at [facility], [place] [address], [place], [place] [address]    Report Status 07/30/2018 FINAL  Final  Aerobic/Anaerobic Culture (surgical/deep wound)     Status: None   Collection Time: 07/30/18  6:59 PM  Result Value Ref Range Status   Specimen Description   Final    ABSCESS LEFT FOOT Performed at [facility] [address] [place], [place] [address]    Special Requests   Final    NONE Performed at [facility], [place] [address], [place], [place] [address]    Gram Stain   Final    NO WBC SEEN ABUNDANT GRAM POSITIVE COCCI ABUNDANT GRAM NEGATIVE RODS    Culture   Final    MODERATE GROUP B STREP(S.AGALACTIAE)ISOLATED TESTING AGAINST S. AGALACTIAE NOT ROUTINELY PERFORMED DUE TO PREDICTABILITY OF AMP/PEN/VAN SUSCEPTIBILITY. MIXED ANAEROBIC FLORA PRESENT.  CALL LAB IF FURTHER IID REQUIRED.    Report Status 08/02/2018 FINAL  Final         Radiology Studies: Mr Foot Left Wo Contrast  Result Date: 08/02/2018 CLINICAL DATA:  Osteomyelitis of the left foot. Foot pain and swelling EXAM: MRI OF THE LEFT FOOT WITHOUT CONTRAST TECHNIQUE: Multiplanar, multisequence MR imaging of the left foot was performed. No  intravenous contrast was administered. COMPARISON:  None. FINDINGS: Bones/Joint/Cartilage Soft tissue wound overlying the lateral aspect of the fifth MTP joint. Marrow edema in the fifth metatarsal head and base of the fifth proximal phalanx with a small fifth MTP joint effusion. These may reflect reactive changes secondary to adjacent soft tissue wound versus mild early septic arthritis-osteomyelitis. No other marrow signal abnormality. No acute fracture or dislocation. No aggressive osseous lesion. No periosteal reaction or bone destruction. Mild osteoarthritis of the first MTP joint. Ligaments Collateral ligaments are intact.  Lisfranc ligament is intact. Muscles and Tendons T2 hyperintense signal throughout the plantar musculature likely neurogenic. Flexor, extensor, and peroneal tendons are grossly intact. Soft tissue No fluid collection or hematoma. No soft tissue mass. Generalized soft tissue edema along the dorsal aspect of the foot which may be reactive secondary to venous insufficiency versus cellulitis. IMPRESSION: Soft tissue wound overlying the lateral aspect of the fifth MTP joint. Marrow edema in the fifth metatarsal head and base of the fifth proximal phalanx with a small fifth MTP joint effusion. These may reflect reactive changes secondary to adjacent soft tissue wound versus mild early septic arthritis-osteomyelitis. Generalized soft tissue edema along the dorsal aspect of the foot which may be reactive secondary to venous insufficiency versus cellulitis. Electronically Signed   By: Kathreen Devoid   On: 08/02/2018 13:23        Scheduled Meds: . enoxaparin (LOVENOX) injection  40 mg Subcutaneous QHS  . famotidine  20 mg Oral QHS  . insulin aspart  0-15 Units Subcutaneous TID WC  . insulin aspart  0-5 Units Subcutaneous QHS  . insulin aspart  4 Units Subcutaneous TID WC  . insulin glargine  15 Units Subcutaneous BID  . metoprolol tartrate  2.5 mg Intravenous Once  . sodium chloride  flush  3 mL Intravenous Q12H   Continuous Infusions: . sodium chloride Stopped (07/30/18 1802)  . 0.9 % NaCl with KCl 20 mEq / L 75 mL/hr at 08/02/18 1800  . ampicillin-sulbactam (UNASYN) IV Stopped (08/02/18 1726)     LOS: 3 days    Time spent: 25 minutes.  Bonnell Public, MD [facility] Pager [phone] If 7PM-7AM, please contact night-coverage [email or web address] Password [facility] 08/02/2018, 11:45 PM

## 2018-08-03 LAB — RENAL FUNCTION PANEL
Albumin: 2.1 g/dL — ABNORMAL LOW (ref 3.5–5.0)
Albumin: 2.2 g/dL — ABNORMAL LOW (ref 3.5–5.0)
Anion gap: 10 (ref 5–15)
Anion gap: 8 (ref 5–15)
BUN: 6 mg/dL (ref 6–20)
BUN: 6 mg/dL (ref 6–20)
CO2: 22 mmol/L (ref 22–32)
CO2: 23 mmol/L (ref 22–32)
Calcium: 7.6 mg/dL — ABNORMAL LOW (ref 8.9–10.3)
Calcium: 7.7 mg/dL — ABNORMAL LOW (ref 8.9–10.3)
Chloride: 103 mmol/L (ref 98–111)
Chloride: 103 mmol/L (ref 98–111)
Creatinine, Ser: 0.78 mg/dL (ref 0.61–1.24)
Creatinine, Ser: 0.85 mg/dL (ref 0.61–1.24)
GFR calc Af Amer: >60 mL/min (ref >60)
GFR calc Af Amer: >60 mL/min (ref >60)
GFR calc non Af Amer: >60 mL/min (ref >60)
GFR calc non Af Amer: >60 mL/min (ref >60)
Glucose, Bld: 131 mg/dL — ABNORMAL HIGH (ref 70–99)
Glucose, Bld: 134 mg/dL — ABNORMAL HIGH (ref 70–99)
Phosphorus: 2.2 mg/dL — ABNORMAL LOW (ref 2.5–4.6)
Phosphorus: 2.2 mg/dL — ABNORMAL LOW (ref 2.5–4.6)
Potassium: 3.3 mmol/L — ABNORMAL LOW (ref 3.5–5.1)
Potassium: 3.3 mmol/L — ABNORMAL LOW (ref 3.5–5.1)
Sodium: 134 mmol/L — ABNORMAL LOW (ref 135–145)
Sodium: 135 mmol/L (ref 135–145)

## 2018-08-03 LAB — GLUCOSE, CAPILLARY
GLUCOSE-CAPILLARY: 136 mg/dL — AB (ref 70–99)
Glucose-Capillary: 142 mg/dL — ABNORMAL HIGH (ref 70–99)
Glucose-Capillary: 146 mg/dL — ABNORMAL HIGH (ref 70–99)
Glucose-Capillary: 116 mg/dL — ABNORMAL HIGH (ref 70–99)

## 2018-08-03 MED ORDER — POTASSIUM CHLORIDE 20 MEQ PO PACK
40.0000 meq | PACK | Freq: Once | ORAL | Status: AC
  Administered 2018-08-03: 40 meq via ORAL
  Filled 2018-08-03: qty 2

## 2018-08-03 MED ORDER — POTASSIUM CHLORIDE CRYS ER 20 MEQ PO TBCR
40.0000 meq | EXTENDED_RELEASE_TABLET | ORAL | Status: DC
  Administered 2018-08-03: 40 meq via ORAL
  Filled 2018-08-03: qty 2

## 2018-08-03 NOTE — Progress Notes (Signed)
Assumed care of patient at 1530. Agree with previous Nurse assessment. Patient in no signs of distress.  [name]. Brigitte Pulse, RN

## 2018-08-03 NOTE — Discharge Instructions (Signed)
You will need to call for a follow-up appoint with Dr. Meridee Score, [facility] - [phone] for continued foot care. Keep your left foot clean and dry. New dry dressing as needed.

## 2018-08-03 NOTE — Progress Notes (Signed)
HR 155, BP 135/84, prn metoprolol given.  Follow-up HR is 145 and BP is 89/65. denies weakness, dizziness, palpitations. MD notified.  [name]. Brigitte Pulse, RN

## 2018-08-03 NOTE — Progress Notes (Signed)
Patient ID: Taylor Hughes, male   DOB: Apr 11, 1971, 47 y.o.   MRN: [id number]  PROGRESS NOTE    Nelton Amsden  RSW:[id number] DOB: 12-27-1970 DOA: 07/27/2018 PCP: Jilda Panda, MD   Brief Narrative:  47 year old male with history of diabetes mellitus type 2 on insulin presented with nausea, vomiting and chills.  He was unable to take his insulin over the last 5 days.  He was found to be in DKA and started on insulin drip along with intravenous fluids.  07/31/2018: Patient underwent I&D of the diabetic left foot abscess yesterday.  Postop, patient became hypotensive and tachycardic.  Hypotension has resolved.  Tachycardia has resolved significantly.  We will continue fluid resuscitation.  Will replete abnormal electrolytes, specifically, potassium and magnesium.  Will change subcutaneous Levemir to subcutaneous Lantus 15 units twice daily.  Will continue to monitor renal function and electrolytes.  We will continue to optimize blood sugar management.  Input from the surgical team is highly appreciated.  Otherwise, patient has no new complaints.  08/01/2018: Patient continues to improve.  However, leukocytosis persists.  Potassium is 3.2, phosphorus is 1.6.  Will replete abnormal electrolytes.  We will adjust patient's antibiotics.  Will discontinue Rocephin and Flagyl.  We will continue vancomycin.  Will start patient on IV cefepime.  Will follow culture results.  Orthopedic input is highly appreciated.  Mild tachycardia persists.  08/03/2018: Patient seen.  Patient looks better today.  Intermittent sinus tachycardia is noted.  We will continue antibiotics for now.  Continue to monitor patient closely.  Assessment & Plan:   Principal Problem:   DKA, type 2 ([place]) Active Problems:   Leukocytosis   Hyperkalemia   Hyponatremia   DKA (diabetic ketoacidoses) (HCC)   Left foot infection  DKA in a patient with history of diabetes mellitus type 2 uncontrolled -Initially started on insulin drip.  After  anion gap closed, switched to long-acting insulin and insulin drip was subsequently discontinued.   -Blood sugars improving but still on the higher side.  Increase Levemir to 25 units subcutaneous daily.  Increase NovoLog with meals to 6 units 3 times a day.  Continue Accu-Cheks with sliding scale coverage.  Diabetes coordinator following.  -care management consult for insulin needs -Feels that his appetite is better this morning. -Hemoglobin A1c is 9.6. 07/31/2018: DKA has resolved.  Continue sliding scale insulin coverage.  Change Levemir to Lantus. 08/03/2018: Blood sugar control has improved significantly.  Continue current regimen.  Left foot cellulitis/diabetic foot infection with ulcer -Left foot x-ray shows soft tissue swelling along with subcutaneous gas concerning for infection, including necrotizing soft tissue infection. -Dr. Blackman/orthopedics consulted.  Will keep n.p.o.  Start Rocephin, Flagyl and vancomycin. -Still febrile intermittently with sinus tachycardia.  Discontinue IV fluids as patient complains of bilateral legs swelling.  Lower extremity duplex was negative for DVT in bilateral lower extremity. 07/31/2018: Patient is status post I&D.  Patient became hypotensive and tachycardic afterwards.  Continue aggressive management for possible sepsis/Sirs that is now resolving. 08/01/2018: Continue postop management as per orthopedic team.   08/03/2018: Wound culture is growing group B.  Continue IV Unasyn.    Possible sepsis/SIRS: Patient is still tachycardic and intermittently hypotensive. Patient is still febrile, however, no leukocytosis. MRI of the foot. Discussed above findings with the orthopedic team, Dr. Zollie Beckers. 08/03/2018: Continue to monitor closely.  Hypokalemia -Replete.   -Continue to monitor.    Noncompliance: Counseled  Sinus tachycardia: Continue to treat underlying infection. Optimize volume status. Avoid hypotension.  DVT  prophylaxis:  Lovenox Code Status: Full Family Communication: None at bedside Disposition Plan: Depends on further evaluation by orthopedics  consultants: Orthopedics  Procedures: None  Antimicrobials:  Started Rocephin, Flagyl and vancomycin on 07/30/2018 08/01/2018: Discontinue Rocephin and Flagyl.  Start IV cefepime. 08/02/2018: IV cefepime changed to IV Unasyn.  Subjective: No new complaints. No chest pain No shortness of breath   Objective: Vitals:   08/03/18 0815 08/03/18 0849 08/03/18 1100 08/03/18 1146  BP:  110/71 103/65   Pulse: (!) 152 (!) 148 94   Resp: 17 (!) 26 15   Temp:    98.6 F (37 C)  TempSrc:    Oral  SpO2: 100% 99% 99%   Weight:      Height:        Intake/Output Summary (Last 24 hours) at 08/03/2018 1226 Last data filed at 08/03/2018 0551 Gross per 24 hour  Intake 2121.6 ml  Output 600 ml  Net 1521.6 ml   Filed Weights   07/27/18 2136  Weight: 111.1 kg    Examination:  General exam: Appears calm and comfortable, no acute distress.  Obese. Respiratory system: Clear to auscultation.   Cardiovascular system: S1-S2, tachycardia.   Gastrointestinal system: Abdomen is obese, soft and nontender.  Organs are difficult to assess.   Extremities: Chronically looking edema.  Left foot is bandaged.   CNS: Alert, awake.  Moving extremities.  No focal neuro deficit  Data Reviewed: I have personally reviewed following labs and imaging studies  CBC: Recent Labs  Lab 07/29/18 0328 07/30/18 0254 07/31/18 0316 08/01/18 0324 08/02/18 0235  WBC 14.8* 10.2 11.3* 12.3* 8.5  NEUTROABS 12.1* 8.1* 9.0* 8.6* 5.5  HGB 13.7 12.8* 12.1* 12.0* 11.9*  HCT 43.0 38.9* 37.0* 36.6* 37.6*  MCV 90.3 88.8 88.3 92.2 93.5  PLT 268 288 314 284 885   Basic Metabolic Panel: Recent Labs  Lab 07/29/18 0328 07/30/18 0254 07/31/18 0316 08/01/18 0324 08/02/18 0235 08/03/18 0324  NA 131* 134* 134* 134*  133* 133* 134*  135  K 3.7 3.7 2.8* 3.2*  3.2* 3.5 3.3*  3.3*  CL 100  105 100 99  99 100 103  103  CO2 16* 20* 20* 18*  22 18* 23  22  GLUCOSE 255* 159* 191* 112*  109* 221* 134*  131*  BUN 10 8 8 6   5* 8 6  6   CREATININE 0.76 0.65 0.71 0.69  0.76 1.08 0.78  0.85  CALCIUM 8.8* 8.0* 7.7* 7.9*  7.8* 7.7* 7.7*  7.6*  MG 2.0 2.0 1.6* 2.1 2.0  --   PHOS  --   --   --  1.6* 2.9 2.2*  2.2*   GFR: Estimated Creatinine Clearance: 137.6 mL/min (by C-G formula based on SCr of 0.85 mg/dL). Liver Function Tests: Recent Labs  Lab 07/27/18 2216 08/01/18 0324 08/02/18 0235 08/03/18 0324  AST 18  --   --   --   ALT 20  --   --   --   ALKPHOS 75  --   --   --   BILITOT 1.7*  --   --   --   PROT 8.3*  --   --   --   ALBUMIN 3.5 2.2* 2.1* 2.1*  2.2*   Recent Labs  Lab 07/27/18 2316  LIPASE 24   No results for input(s): AMMONIA in the last 168 hours. Coagulation Profile: No results for input(s): INR, PROTIME in the last 168 hours. Cardiac Enzymes: Recent Labs  Lab 07/27/18  2316  TROPONINI <0.03   BNP (last 3 results) No results for input(s): PROBNP in the last 8760 hours. HbA1C: No results for input(s): HGBA1C in the last 72 hours. CBG: Recent Labs  Lab 08/02/18 0748 08/02/18 1124 08/02/18 1649 08/02/18 2155 08/03/18 0744  GLUCAP 216* 176* 108* 165* 142*   Lipid Profile: No results for input(s): CHOL, HDL, LDLCALC, TRIG, CHOLHDL, LDLDIRECT in the last 72 hours. Thyroid Function Tests: No results for input(s): TSH, T4TOTAL, FREET4, T3FREE, THYROIDAB in the last 72 hours. Anemia Panel: No results for input(s): VITAMINB12, FOLATE, FERRITIN, TIBC, IRON, RETICCTPCT in the last 72 hours. Sepsis Labs: Recent Labs  Lab 07/27/18 2226 07/28/18 0011  LATICACIDVEN 1.38 1.27    Recent Results (from the past 240 hour(s))  MRSA PCR Screening     Status: None   Collection Time: 07/28/18  2:06 AM  Result Value Ref Range Status   MRSA by PCR NEGATIVE NEGATIVE Final    Comment:        The GeneXpert MRSA Assay (FDA approved for NASAL  specimens only), is one component of a comprehensive MRSA colonization surveillance program. It is not intended to diagnose MRSA infection nor to guide or monitor treatment for MRSA infections. Performed at [facility], [place] [address], [place], [place] [address]   Respiratory Panel by PCR     Status: Abnormal   Collection Time: 07/28/18  6:38 AM  Result Value Ref Range Status   Adenovirus NOT DETECTED NOT DETECTED Final   Coronavirus 229E NOT DETECTED NOT DETECTED Final   Coronavirus HKU1 NOT DETECTED NOT DETECTED Final   Coronavirus NL63 NOT DETECTED NOT DETECTED Final   Coronavirus OC43 NOT DETECTED NOT DETECTED Final   Metapneumovirus NOT DETECTED NOT DETECTED Final   Rhinovirus / Enterovirus NOT DETECTED NOT DETECTED Final   Influenza A NOT DETECTED NOT DETECTED Final   Influenza B NOT DETECTED NOT DETECTED Final   Parainfluenza Virus 1 NOT DETECTED NOT DETECTED Final   Parainfluenza Virus 2 NOT DETECTED NOT DETECTED Final   Parainfluenza Virus 3 NOT DETECTED NOT DETECTED Final   Parainfluenza Virus 4 DETECTED (A) NOT DETECTED Final   Respiratory Syncytial Virus NOT DETECTED NOT DETECTED Final   Bordetella pertussis NOT DETECTED NOT DETECTED Final   Chlamydophila pneumoniae NOT DETECTED NOT DETECTED Final   Mycoplasma pneumoniae NOT DETECTED NOT DETECTED Final    Comment: Performed at [facility], [place] [address] [place], [place] [address]  Culture, blood (routine x 2)     Status: None   Collection Time: 07/28/18  7:36 PM  Result Value Ref Range Status   Specimen Description   Final    BLOOD LEFT ANTECUBITAL Performed at [place] [address] [place], [place] [address]    Special Requests   Final    BOTTLES DRAWN AEROBIC AND ANAEROBIC Blood Culture adequate volume Performed at [facility] [address], [place], [place] [address]    Culture   Final    NO GROWTH 5 DAYS Performed at [facility], [place] [address], [place], [place] [address]    Report Status 08/02/2018 FINAL  Final  Culture, blood (routine x 2)     Status: None   Collection Time: 07/28/18  7:42 PM  Result Value Ref Range Status   Specimen Description   Final    BLOOD RIGHT HAND Performed at [facility] [address], [place], [place] [address]  Special Requests   Final    BOTTLES DRAWN AEROBIC AND ANAEROBIC Blood Culture adequate volume Performed at [place] [address], [place], [place] [address]    Culture   Final    NO GROWTH 5 DAYS Performed at [facility], [place] [address] [place], [place] [address]    Report Status 08/02/2018 FINAL  Final  Culture, Urine     Status: None   Collection Time: 07/29/18 12:15 PM  Result Value Ref Range Status   Specimen Description   Final    URINE, CLEAN CATCH Performed at [facility], [place] [address], [place], [place] [address]    Special Requests   Final    NONE Performed at [facility], [place] [address] [place], [place] [address]    Culture   Final    NO GROWTH Performed at [facility], [place] [address] [place], [place] [address]    Report Status 07/30/2018 FINAL  Final  Aerobic/Anaerobic Culture (surgical/deep wound)     Status: None (Preliminary result)   Collection Time: 07/30/18  6:59 PM  Result Value Ref Range Status   Specimen Description   Final    ABSCESS LEFT FOOT Performed at [place] [address], [place], [place] [address]    Special Requests   Final    NONE Performed at [facility], [place] [address] [place],  [address]    Gram Stain   Final    NO WBC SEEN ABUNDANT GRAM POSITIVE COCCI ABUNDANT GRAM NEGATIVE RODS    Culture   Final    MODERATE GROUP B STREP(S.AGALACTIAE)ISOLATED TESTING AGAINST S. AGALACTIAE NOT ROUTINELY PERFORMED DUE TO PREDICTABILITY OF AMP/PEN/VAN  SUSCEPTIBILITY. MIXED ANAEROBIC FLORA PRESENT.  CALL LAB IF FURTHER IID REQUIRED.    Report Status PENDING  Incomplete         Radiology Studies: Mr Foot Left Wo Contrast  Result Date: 08/02/2018 CLINICAL DATA:  Osteomyelitis of the left foot. Foot pain and swelling EXAM: MRI OF THE LEFT FOOT WITHOUT CONTRAST TECHNIQUE: Multiplanar, multisequence MR imaging of the left foot was performed. No intravenous contrast was administered. COMPARISON:  None. FINDINGS: Bones/Joint/Cartilage Soft tissue wound overlying the lateral aspect of the fifth MTP joint. Marrow edema in the fifth metatarsal head and base of the fifth proximal phalanx with a small fifth MTP joint effusion. These may reflect reactive changes secondary to adjacent soft tissue wound versus mild early septic arthritis-osteomyelitis. No other marrow signal abnormality. No acute fracture or dislocation. No aggressive osseous lesion. No periosteal reaction or bone destruction. Mild osteoarthritis of the first MTP joint. Ligaments Collateral ligaments are intact.  Lisfranc ligament is intact. Muscles and Tendons T2 hyperintense signal throughout the plantar musculature likely neurogenic. Flexor, extensor, and peroneal tendons are grossly intact. Soft tissue No fluid collection or hematoma. No soft tissue mass. Generalized soft tissue edema along the dorsal aspect of the foot which may be reactive secondary to venous insufficiency versus cellulitis. IMPRESSION: Soft tissue wound overlying the lateral aspect of the fifth MTP joint. Marrow edema in the fifth metatarsal head and base of the fifth proximal phalanx with a small fifth MTP joint effusion. These may reflect reactive changes secondary to adjacent soft tissue wound versus mild early septic arthritis-osteomyelitis. Generalized soft tissue edema along the dorsal aspect of the foot which may be reactive secondary to venous insufficiency versus cellulitis. Electronically Signed   By: Kathreen Devoid  On: 08/02/2018 13:23        Scheduled Meds: . enoxaparin (LOVENOX) injection  40 mg Subcutaneous QHS  . famotidine  20 mg Oral QHS  . insulin aspart  0-15 Units Subcutaneous TID WC  . insulin aspart  0-5 Units Subcutaneous QHS  . insulin aspart  4 Units Subcutaneous TID WC  . insulin glargine  15 Units Subcutaneous BID  . metoprolol tartrate  2.5 mg Intravenous Once  . potassium chloride  40 mEq Oral Once  . sodium chloride flush  3 mL Intravenous Q12H   Continuous Infusions: . sodium chloride Stopped (07/30/18 1802)  . 0.9 % NaCl with KCl 20 mEq / L 75 mL/hr at 08/03/18 0551  . ampicillin-sulbactam (UNASYN) IV 3 g (08/03/18 1155)     LOS: 4 days    Time spent: 25 minutes.  Bonnell Public, MD [facility] Pager [phone] If 7PM-7AM, please contact night-coverage [email or web address] Password [facility] 08/03/2018, 12:26 PM

## 2018-08-03 NOTE — Progress Notes (Signed)
Patient ID: Taylor Hughes, male   DOB: [birth year], 47 y.o.   MRN: [id number] Seems to be improving clinically.  Denies left foot pain.  The wound is showing signs of breakdown and will likely need additional surgery in the future.  Will continue to follow while he is in the hospital and will watch closely.  My partner Dr. Sharol Given wants to see him in follow-up as an outpatient in 1 week.

## 2018-08-04 DIAGNOSIS — A408 Other streptococcal sepsis: Secondary | ICD-10-CM

## 2018-08-04 DIAGNOSIS — E876 Hypokalemia: Secondary | ICD-10-CM

## 2018-08-04 LAB — RENAL FUNCTION PANEL
Albumin: 1.8 g/dL — ABNORMAL LOW (ref 3.5–5.0)
Anion gap: 10 (ref 5–15)
BUN: 5 mg/dL — ABNORMAL LOW (ref 6–20)
CO2: 21 mmol/L — ABNORMAL LOW (ref 22–32)
Calcium: 7.5 mg/dL — ABNORMAL LOW (ref 8.9–10.3)
Chloride: 109 mmol/L (ref 98–111)
Creatinine, Ser: 0.81 mg/dL (ref 0.61–1.24)
GFR calc Af Amer: >60 mL/min
GFR calc non Af Amer: >60 mL/min
Glucose, Bld: 112 mg/dL — ABNORMAL HIGH (ref 70–99)
Phosphorus: 2 mg/dL — ABNORMAL LOW (ref 2.5–4.6)
Potassium: 3.5 mmol/L (ref 3.5–5.1)
Sodium: 140 mmol/L (ref 135–145)

## 2018-08-04 LAB — CBC WITH DIFFERENTIAL/PLATELET
Abs Immature Granulocytes: 0.03 K/uL (ref 0.00–0.07)
Basophils Absolute: 0 K/uL (ref 0.0–0.1)
Basophils Relative: 0 %
Eosinophils Absolute: 0.1 K/uL (ref 0.0–0.5)
Eosinophils Relative: 1 %
HCT: 33.9 % — ABNORMAL LOW (ref 39.0–52.0)
Hemoglobin: 11 g/dL — ABNORMAL LOW (ref 13.0–17.0)
Immature Granulocytes: 1 %
Lymphocytes Relative: 23 %
Lymphs Abs: 1.4 K/uL (ref 0.7–4.0)
MCH: 29.3 pg (ref 26.0–34.0)
MCHC: 32.4 g/dL (ref 30.0–36.0)
MCV: 90.4 fL (ref 80.0–100.0)
Monocytes Absolute: 0.9 K/uL (ref 0.1–1.0)
Monocytes Relative: 14 %
Neutro Abs: 3.8 K/uL (ref 1.7–7.7)
Neutrophils Relative %: 61 %
Platelets: 420 K/uL — ABNORMAL HIGH (ref 150–400)
RBC: 3.75 MIL/uL — ABNORMAL LOW (ref 4.22–5.81)
RDW: 15.1 % (ref 11.5–15.5)
WBC: 6.2 K/uL (ref 4.0–10.5)
nRBC: 0 % (ref 0.0–0.2)

## 2018-08-04 LAB — AEROBIC/ANAEROBIC CULTURE W GRAM STAIN (SURGICAL/DEEP WOUND): Gram Stain: NONE SEEN

## 2018-08-04 LAB — GLUCOSE, CAPILLARY
Glucose-Capillary: 86 mg/dL (ref 70–99)
Glucose-Capillary: 102 mg/dL — ABNORMAL HIGH (ref 70–99)
Glucose-Capillary: 115 mg/dL — ABNORMAL HIGH (ref 70–99)

## 2018-08-04 LAB — MAGNESIUM: Magnesium: 1.9 mg/dL (ref 1.7–2.4)

## 2018-08-04 MED ORDER — SODIUM CHLORIDE 0.9 % IV BOLUS
500.0000 mL | Freq: Once | INTRAVENOUS | Status: AC
  Administered 2018-08-04: 500 mL via INTRAVENOUS

## 2018-08-04 MED ORDER — INSULIN GLARGINE 100 UNIT/ML ~~LOC~~ SOLN
12.0000 [IU] | Freq: Two times a day (BID) | SUBCUTANEOUS | Status: DC
  Administered 2018-08-04 – 2018-08-05 (×2): 12 [IU] via SUBCUTANEOUS
  Filled 2018-08-04 (×3): qty 0.12

## 2018-08-04 MED ORDER — METOPROLOL TARTRATE 5 MG/5ML IV SOLN
2.5000 mg | Freq: Once | INTRAVENOUS | Status: AC
  Administered 2018-08-04: 2.5 mg via INTRAVENOUS

## 2018-08-04 MED ORDER — POTASSIUM CHLORIDE CRYS ER 20 MEQ PO TBCR
40.0000 meq | EXTENDED_RELEASE_TABLET | ORAL | Status: AC
  Administered 2018-08-04 (×2): 40 meq via ORAL
  Filled 2018-08-04 (×2): qty 2

## 2018-08-04 NOTE — Progress Notes (Signed)
   08/04/18 1739  Vitals  Temp 97.8 F (36.6 C)  Temp Source Oral  BP 109/74  MAP (mmHg) 85  BP Location Left Arm  BP Method Automatic  Pulse Rate (!) 144  Oxygen Therapy  SpO2 99 %  O2 Device Room Air     08/04/18 1739  Vitals  Temp 97.8 F (36.6 C)  Temp Source Oral  BP 109/74  MAP (mmHg) 85  BP Location Left Arm  BP Method Automatic  Pulse Rate (!) 144  Oxygen Therapy  SpO2 99 %  O2 Device Room Air  Patient resting bed.  Denies pain, shortness of breath, dizziness.  Dr. Marthenia Rolling notified of VS and Metoprolol given.

## 2018-08-04 NOTE — Consult Note (Signed)
[place] Nurse wound consult note Reason for Consult: Simultaneously consulted with Orthopedics.  Plan is in place for further surgery; inter-practice referral to Dr. Sharol Given from Dr. Ninfa Linden. Orthopedics is following closely  There is no role for WOC nursing at this time.  [place] nursing team will not follow, but will remain available to this patient, the nursing and medical teams.  Please re-consult if needed. Thanks, Maudie Flakes, MSN, RN, [place], Arther Abbott  Pager# [phone]

## 2018-08-04 NOTE — Progress Notes (Signed)
   08/04/18 0815  Vitals  BP 131/90  MAP (mmHg) 102  BP Location Left Arm  BP Method Automatic  Patient Position (if appropriate) Lying  Pulse Rate 100  Pulse Rate Source Monitor  Oxygen Therapy  SpO2 97 %  O2 Device Room Air  Patient's heart rate in 140s this morning.  Patient denies pain, dizziness, shortness of breath.  Patient drank some orange juice and vomited it back up but reports he now "feels better and no nausea."  VS obtained.  Pulse now 100.  Will continue to monitor.

## 2018-08-04 NOTE — Progress Notes (Signed)
Patient HR sustaining in the 140's at rest and 150's with activity. BP below 100's. Patient denies any chest pain or palpitation. NP on call made aware and order for NS 500 ML bolus given. Patient tolerated well.

## 2018-08-04 NOTE — Progress Notes (Signed)
Patient ID: Taylor Hughes, male   DOB: 11/28/1970, 47 y.o.   MRN: [id number] I have spoken to the patient and he understands fully that he will need further surgery on his left foot in the coming week.  I told him about the potential for further surgery this Friday afternoon, but he would rather go home and follow-up as an outpatient this coming Monday 12/30.  I place a new dressing.  He will need to be seen in our office Monday.  I'll still follow-up on him while he is here.

## 2018-08-04 NOTE — Progress Notes (Signed)
Patient ID: Margues Filippini, male   DOB: [birth year], 47 y.o.   MRN: [id number]  PROGRESS NOTE    Kristoff Coonradt  SFK:[id number] DOB: 08-17-1970 DOA: 07/27/2018 PCP: Jilda Panda, MD   Brief Narrative:  47 year old male with history of diabetes mellitus type 2 on insulin presented with nausea, vomiting and chills.  He was unable to take his insulin over the last 5 days.  He was found to be in DKA and started on insulin drip along with intravenous fluids.  07/31/2018: Patient underwent I&D of the diabetic left foot abscess yesterday.  Postop, patient became hypotensive and tachycardic.  Hypotension has resolved.  Tachycardia has resolved significantly.  We will continue fluid resuscitation.  Will replete abnormal electrolytes, specifically, potassium and magnesium.  Will change subcutaneous Levemir to subcutaneous Lantus 15 units twice daily.  Will continue to monitor renal function and electrolytes.  We will continue to optimize blood sugar management.  Input from the surgical team is highly appreciated.  Otherwise, patient has no new complaints.  08/01/2018: Patient continues to improve.  However, leukocytosis persists.  Potassium is 3.2, phosphorus is 1.6.  Will replete abnormal electrolytes.  We will adjust patient's antibiotics.  Will discontinue Rocephin and Flagyl.  We will continue vancomycin.  Will start patient on IV cefepime.  Will follow culture results.  Orthopedic input is highly appreciated.  Mild tachycardia persists.  08/03/2018: Patient seen.  Patient looks better today.  Intermittent sinus tachycardia is noted.  We will continue antibiotics for now.  Continue to monitor patient closely.  08/04/2018: Patient seen.  Orthopedic input is appreciated.  No surgery on the left foot advised for 08/09/2018, however, patient prefers to go home fasting follow with the orthopedic team.  I have explained to the patient the need to add to her with orthopedic plans.  I am also of the opinion that the  patient should proceed with orthopedic surgery as advised by the orthopedic team.  My opinion was communicated to the patient.  Patient is awake, alert and oriented to time, place and person.  Patient has a capacity to receive and process medical information.  Therefore, patient has capacity to make medical decisions.  Assessment & Plan:   Principal Problem:   DKA, type 2 ([place]) Active Problems:   Leukocytosis   Hyperkalemia   Hyponatremia   DKA (diabetic ketoacidoses) ([facility])   Left foot infection  DKA in a patient with history of diabetes mellitus type 2 uncontrolled -Initially started on insulin drip.  After anion gap closed, switched to long-acting insulin and insulin drip was subsequently discontinued.   -Blood sugars improving but still on the higher side.  Increase Levemir to 25 units subcutaneous daily.  Increase NovoLog with meals to 6 units 3 times a day.  Continue Accu-Cheks with sliding scale coverage.  Diabetes coordinator following.  -care management consult for insulin needs -Feels that his appetite is better this morning. -Hemoglobin A1c is 9.6. 07/31/2018: DKA has resolved.  Continue sliding scale insulin coverage.  Change Levemir to Lantus. 08/03/2018: Blood sugar control has improved significantly.  Continue current regimen. 08/04/2018: Episode of low blood sugar reported.  We decreased subcutaneous Lantus to 12 units twice daily.  Left foot cellulitis/diabetic foot infection with ulcer -Left foot x-ray shows soft tissue swelling along with subcutaneous gas concerning for infection, including necrotizing soft tissue infection. -Dr. Blackman/orthopedics consulted.  Will keep n.p.o.  Start Rocephin, Flagyl and vancomycin. -Still febrile intermittently with sinus tachycardia.  Discontinue IV fluids as patient complains of bilateral  legs swelling.  Lower extremity duplex was negative for DVT in bilateral lower extremity. 07/31/2018: Patient is status post I&D.  Patient became  hypotensive and tachycardic afterwards.  Continue aggressive management for possible sepsis/Sirs that is now resolving. 08/01/2018: Continue postop management as per orthopedic team.   08/03/2018: Wound culture is growing group B.  Continue IV Unasyn. 08/04/2018: Kindly see above.  Possible sepsis/SIRS: Patient is still tachycardic and intermittently hypotensive. Patient is still febrile, however, no leukocytosis. MRI of the foot. Discussed above findings with the orthopedic team, Dr. Zollie Beckers. 08/03/2018: Continue to monitor closely. 08/04/2018: I have advised patient to adhere with the orthopedic recommendations.  Hypokalemia -Replete.   -Continue to monitor.    Noncompliance: Counseled  Sinus tachycardia: Continue to treat underlying infection. Optimize volume status. Avoid hypotension. 08/04/2018: Seems to be improving.  DVT prophylaxis: Lovenox Code Status: Full Family Communication: None at bedside Disposition Plan: Depends on further evaluation by orthopedics  consultants: Orthopedics  Procedures: None  Antimicrobials:  Started Rocephin, Flagyl and vancomycin on 07/30/2018 08/01/2018: Discontinue Rocephin and Flagyl.  Start IV cefepime. 08/02/2018: IV cefepime changed to IV Unasyn.  Subjective: No new complaints. No chest pain No shortness of breath   Objective: Vitals:   08/04/18 1404 08/04/18 1702 08/04/18 1711 08/04/18 1739  BP: 126/87 119/71 95/69 109/74  Pulse: (!) 101 (!) 153 (!) 152 (!) 144  Resp: 20 20 18    Temp: 99.2 F (37.3 C)   97.8 F (36.6 C)  TempSrc: Oral   Oral  SpO2: 100% 99% 98% 99%  Weight:      Height:        Intake/Output Summary (Last 24 hours) at 08/04/2018 1806 Last data filed at 08/04/2018 1500 Gross per 24 hour  Intake 2196.18 ml  Output 595 ml  Net 1601.18 ml   Filed Weights   07/27/18 2136  Weight: 111.1 kg    Examination:  General exam: Appears calm and comfortable, no acute distress.   Obese. Respiratory system: Clear to auscultation.   Cardiovascular system: S1-S2, tachycardia.   Gastrointestinal system: Abdomen is obese, soft and nontender.  Organs are difficult to assess.   Extremities: Chronically looking edema.  Left foot is bandaged.   CNS: Alert, awake.  Moving extremities.  No focal neuro deficit  Data Reviewed: I have personally reviewed following labs and imaging studies  CBC: Recent Labs  Lab 07/30/18 0254 07/31/18 0316 08/01/18 0324 08/02/18 0235 08/04/18 0352  WBC 10.2 11.3* 12.3* 8.5 6.2  NEUTROABS 8.1* 9.0* 8.6* 5.5 3.8  HGB 12.8* 12.1* 12.0* 11.9* 11.0*  HCT 38.9* 37.0* 36.6* 37.6* 33.9*  MCV 88.8 88.3 92.2 93.5 90.4  PLT 288 314 284 361 601*   Basic Metabolic Panel: Recent Labs  Lab 07/30/18 0254 07/31/18 0316 08/01/18 0324 08/02/18 0235 08/03/18 0324 08/04/18 0352  NA 134* 134* 134*  133* 133* 134*  135 140  K 3.7 2.8* 3.2*  3.2* 3.5 3.3*  3.3* 3.5  CL 105 100 99  99 100 103  103 109  CO2 20* 20* 18*  22 18* 23  22 21*  GLUCOSE 159* 191* 112*  109* 221* 134*  131* 112*  BUN 8 8 6   5* 8 6  6  5*  CREATININE 0.65 0.71 0.69  0.76 1.08 0.78  0.85 0.81  CALCIUM 8.0* 7.7* 7.9*  7.8* 7.7* 7.7*  7.6* 7.5*  MG 2.0 1.6* 2.1 2.0  --  1.9  PHOS  --   --  1.6* 2.9 2.2*  2.2* 2.0*   GFR: Estimated Creatinine Clearance: 144.4 mL/min (by C-G formula based on SCr of 0.81 mg/dL). Liver Function Tests: Recent Labs  Lab 08/01/18 0324 08/02/18 0235 08/03/18 0324 08/04/18 0352  ALBUMIN 2.2* 2.1* 2.1*  2.2* 1.8*   No results for input(s): LIPASE, AMYLASE in the last 168 hours. No results for input(s): AMMONIA in the last 168 hours. Coagulation Profile: No results for input(s): INR, PROTIME in the last 168 hours. Cardiac Enzymes: No results for input(s): CKTOTAL, CKMB, CKMBINDEX, TROPONINI in the last 168 hours. BNP (last 3 results) No results for input(s): PROBNP in the last 8760 hours. HbA1C: No results for input(s):  HGBA1C in the last 72 hours. CBG: Recent Labs  Lab 08/03/18 1125 08/03/18 1619 08/03/18 2035 08/04/18 0741 08/04/18 1153  GLUCAP 146* 136* 116* 86 102*   Lipid Profile: No results for input(s): CHOL, HDL, LDLCALC, TRIG, CHOLHDL, LDLDIRECT in the last 72 hours. Thyroid Function Tests: No results for input(s): TSH, T4TOTAL, FREET4, T3FREE, THYROIDAB in the last 72 hours. Anemia Panel: No results for input(s): VITAMINB12, FOLATE, FERRITIN, TIBC, IRON, RETICCTPCT in the last 72 hours. Sepsis Labs: No results for input(s): PROCALCITON, LATICACIDVEN in the last 168 hours.  Recent Results (from the past 240 hour(s))  MRSA PCR Screening     Status: None   Collection Time: 07/28/18  2:06 AM  Result Value Ref Range Status   MRSA by PCR NEGATIVE NEGATIVE Final    Comment:        The GeneXpert MRSA Assay (FDA approved for NASAL specimens only), is one component of a comprehensive MRSA colonization surveillance program. It is not intended to diagnose MRSA infection nor to guide or monitor treatment for MRSA infections. Performed at [facility], [place] [address] [place], [place] [address]   Respiratory Panel by PCR     Status: Abnormal   Collection Time: 07/28/18  6:38 AM  Result Value Ref Range Status   Adenovirus NOT DETECTED NOT DETECTED Final   Coronavirus 229E NOT DETECTED NOT DETECTED Final   Coronavirus HKU1 NOT DETECTED NOT DETECTED Final   Coronavirus NL63 NOT DETECTED NOT DETECTED Final   Coronavirus OC43 NOT DETECTED NOT DETECTED Final   Metapneumovirus NOT DETECTED NOT DETECTED Final   Rhinovirus / Enterovirus NOT DETECTED NOT DETECTED Final   Influenza A NOT DETECTED NOT DETECTED Final   Influenza B NOT DETECTED NOT DETECTED Final   Parainfluenza Virus 1 NOT DETECTED NOT DETECTED Final   Parainfluenza Virus 2 NOT DETECTED NOT DETECTED Final   Parainfluenza Virus 3 NOT DETECTED NOT DETECTED Final   Parainfluenza Virus 4 DETECTED (A) NOT DETECTED  Final   Respiratory Syncytial Virus NOT DETECTED NOT DETECTED Final   Bordetella pertussis NOT DETECTED NOT DETECTED Final   Chlamydophila pneumoniae NOT DETECTED NOT DETECTED Final   Mycoplasma pneumoniae NOT DETECTED NOT DETECTED Final    Comment: Performed at [facility], [place] [address] [place], [place] [address]  Culture, blood (routine x 2)     Status: None   Collection Time: 07/28/18  7:36 PM  Result Value Ref Range Status   Specimen Description   Final    BLOOD LEFT ANTECUBITAL Performed at [place] [address], [place], [place] [address]    Special Requests   Final    BOTTLES DRAWN AEROBIC AND ANAEROBIC Blood Culture adequate volume Performed at [facility] [address], [place], [place] [address]    Culture  Final    NO GROWTH 5 DAYS Performed at [facility], [place] [address] [place], [place] [address]    Report Status 08/02/2018 FINAL  Final  Culture, blood (routine x 2)     Status: None   Collection Time: 07/28/18  7:42 PM  Result Value Ref Range Status   Specimen Description   Final    BLOOD RIGHT HAND Performed at [facility] [address], [place], [place] [address]    Special Requests   Final    BOTTLES DRAWN AEROBIC AND ANAEROBIC Blood Culture adequate volume Performed at [facility] [address] [place], [place] [address]    Culture   Final    NO GROWTH 5 DAYS Performed at [facility], [place] [address], [place], [place] [address]    Report Status 08/02/2018 FINAL  Final  Culture, Urine     Status: None   Collection Time: 07/29/18 12:15 PM  Result Value Ref Range Status   Specimen Description   Final    URINE, CLEAN CATCH Performed at [facility], [place] [address], [place], [place] [address]    Special Requests   Final    NONE Performed at [facility], [place] [address] [place], [place] [address]     Culture   Final    NO GROWTH Performed at [facility], [place] [address] [place], [place] [address]    Report Status 07/30/2018 FINAL  Final  Aerobic/Anaerobic Culture (surgical/deep wound)     Status: None   Collection Time: 07/30/18  6:59 PM  Result Value Ref Range Status   Specimen Description   Final    ABSCESS LEFT FOOT Performed at [facility] [address], [place], [place] [address]    Special Requests   Final    NONE Performed at [facility], [place] [address], [place], [place] [address]    Gram Stain   Final    NO WBC SEEN ABUNDANT GRAM POSITIVE COCCI ABUNDANT GRAM NEGATIVE RODS    Culture   Final    MODERATE GROUP B STREP(S.AGALACTIAE)ISOLATED TESTING AGAINST S. AGALACTIAE NOT ROUTINELY PERFORMED DUE TO PREDICTABILITY OF AMP/PEN/VAN SUSCEPTIBILITY. MIXED ANAEROBIC FLORA PRESENT.  CALL LAB IF FURTHER IID REQUIRED.    Report Status 08/04/2018 FINAL  Final         Radiology Studies: No results found.      Scheduled Meds: . enoxaparin (LOVENOX) injection  40 mg Subcutaneous QHS  . famotidine  20 mg Oral QHS  . insulin aspart  0-15 Units Subcutaneous TID WC  . insulin aspart  0-5 Units Subcutaneous QHS  . insulin aspart  4 Units Subcutaneous TID WC  . insulin glargine  12 Units Subcutaneous BID  . metoprolol tartrate  2.5 mg Intravenous Once  . sodium chloride flush  3 mL Intravenous Q12H   Continuous Infusions: . sodium chloride Stopped (07/30/18 1802)  . 0.9 % NaCl with KCl 20 mEq / L 75 mL/hr at 08/04/18 0659  . ampicillin-sulbactam (UNASYN) IV 3 g (08/04/18 1241)     LOS: 5 days    Time spent: 25 minutes.  Bonnell Public, MD [facility] Pager [phone] If 7PM-7AM, please contact night-coverage [email or web address] Password [facility] 08/04/2018, 6:06 PM

## 2018-08-05 DIAGNOSIS — A401 Sepsis due to streptococcus, group B: Secondary | ICD-10-CM

## 2018-08-05 DIAGNOSIS — L089 Local infection of the skin and subcutaneous tissue, unspecified: Secondary | ICD-10-CM

## 2018-08-05 DIAGNOSIS — E11628 Type 2 diabetes mellitus with other skin complications: Secondary | ICD-10-CM

## 2018-08-05 LAB — C DIFFICILE QUICK SCREEN W PCR REFLEX
C Diff antigen: NEGATIVE
C Diff interpretation: NOT DETECTED
C Diff toxin: NEGATIVE

## 2018-08-05 LAB — RENAL FUNCTION PANEL
Albumin: 1.9 g/dL — ABNORMAL LOW (ref 3.5–5.0)
Anion gap: 6 (ref 5–15)
BUN: 5 mg/dL — ABNORMAL LOW (ref 6–20)
CO2: 22 mmol/L (ref 22–32)
Calcium: 7.5 mg/dL — ABNORMAL LOW (ref 8.9–10.3)
Chloride: 112 mmol/L — ABNORMAL HIGH (ref 98–111)
Creatinine, Ser: 0.81 mg/dL (ref 0.61–1.24)
GFR calc Af Amer: >60 mL/min (ref >60)
GFR calc non Af Amer: >60 mL/min (ref >60)
Glucose, Bld: 129 mg/dL — ABNORMAL HIGH (ref 70–99)
Phosphorus: 1.8 mg/dL — ABNORMAL LOW (ref 2.5–4.6)
Potassium: 4.4 mmol/L (ref 3.5–5.1)
Sodium: 140 mmol/L (ref 135–145)

## 2018-08-05 LAB — GLUCOSE, CAPILLARY
Glucose-Capillary: 134 mg/dL — ABNORMAL HIGH (ref 70–99)
Glucose-Capillary: 118 mg/dL — ABNORMAL HIGH (ref 70–99)
Glucose-Capillary: 105 mg/dL — ABNORMAL HIGH (ref 70–99)

## 2018-08-05 MED ORDER — INSULIN ASPART 100 UNIT/ML ~~LOC~~ SOLN: 4.0000 [IU] | Freq: Three times a day (TID) | SUBCUTANEOUS | 11 refills | Status: DC

## 2018-08-05 MED ORDER — TRAMADOL HCL 50 MG PO TABS: 50.0000 mg | ORAL_TABLET | Freq: Four times a day (QID) | ORAL | 0 refills | Status: DC | PRN

## 2018-08-05 MED ORDER — INSULIN GLARGINE 100 UNIT/ML ~~LOC~~ SOLN: 12.0000 [IU] | Freq: Two times a day (BID) | SUBCUTANEOUS | 11 refills | Status: DC

## 2018-08-05 MED ORDER — METOPROLOL TARTRATE 25 MG PO TABS: 25.0000 mg | ORAL_TABLET | Freq: Two times a day (BID) | ORAL | 11 refills | Status: DC

## 2018-08-05 MED ORDER — AMOXICILLIN-POT CLAVULANATE 875-125 MG PO TABS: 1.0000 | ORAL_TABLET | Freq: Two times a day (BID) | ORAL | 0 refills | Status: AC

## 2018-08-05 MED ORDER — DIPHENOXYLATE-ATROPINE 2.5-0.025 MG PO TABS
2.0000 | ORAL_TABLET | Freq: Once | ORAL | Status: AC
  Administered 2018-08-05: 2 via ORAL
  Filled 2018-08-05: qty 2

## 2018-08-05 MED FILL — AMOX-CLAV 875-125 MG TABLET: 875-125 | 10 days supply | Qty: 20 | Fill #0

## 2018-08-05 MED FILL — !NOVOLOG 100UNITS/ML VIAL: 100/ML | 28 days supply | Qty: 10 | Fill #0

## 2018-08-05 MED FILL — !LANTUS 100 UNITS/ML VIAL: 100 | 28 days supply | Qty: 10 | Fill #0

## 2018-08-05 MED FILL — METOPROLOL TARTRATE 25 MG T: 25 | 30 days supply | Qty: 60 | Fill #0

## 2018-08-05 NOTE — Progress Notes (Signed)
Patient ID: Taylor Hughes, male   DOB: 1970/12/27, 47 y.o.   MRN: [id number] The patient really wants to go home today and follow-up with me Monday 12/20 as an outpatient.  He understands fully that he will definitely need more surgery on his left foot.  We will arrange for that after we see him Monday in the office.

## 2018-08-05 NOTE — Discharge Summary (Signed)
Physician Discharge Summary  Patient ID: Taylor Hughes MRN: [id number] DOB/AGE: 1970-08-26 47 y.o.  Admit date: 07/27/2018 Discharge date: 08/05/2018  Admission Diagnoses:  Discharge Diagnoses:  Principal Problem:   DKA, type 2 ([place]) Active Problems:   Leukocytosis   Hyperkalemia   Hyponatremia   DKA (diabetic ketoacidoses) ([place])   Left foot infection   Discharged Condition: stable  Hospital Course: Patient is a 47 year old male, with past medical history significant for diabetes mellitus type 2 on insulin.  Patient was admitted with DKA.  Apparently, patient had not taken his insulin for 5 days preceding admission.  Patient was admitted and started on insulin drip and aggressive hydration.  On presentation, patient was also found to have left diabetic foot abscess.  Patient underwent I&D on 07/30/2017.  Postoperatively, patient was transiently hypotensive and tachycardic.  Patient was managed with IV antibiotics and volume resuscitation, but continued to experience intermittent significant sinus tachycardia.  Electrolytes were monitored and corrected during the hospital stay.  Orthopedic team advised for the left foot surgery on 08/09/2018, but patient insisted on being discharged back home, and preferred to follow-up with orthopedic surgery team on discharge for further surgery.  Patient has capacity to make medical decisions.  Patient clearly understands the risks of not proceeding with further surgery as advised, especially, thickening to considered intermediate significant sinus tachycardia.  DKA in a patient with history of diabetes mellitus type 2 uncontrolled: -Initially started on insulin drip.  After anion gap closed, switched to long-acting insulin and insulin drip was subsequently discontinued.   -Blood sugar control improved significantly during the hospital stay. -Subcutaneous Levemir was changed to subcutaneous Lantus 12 units twice daily.- -HbA1c was 9.6%. -Continue to  monitor patient's blood sugar closely. -Need to comply with diabetes mellitus management discussed with patient extensively.  Left foot cellulitis/diabetic foot infection with ulcer: -Left foot x-ray shows soft tissue swelling along with subcutaneous gas concerning for infection, including necrotizing soft tissue infection. -Dr. Blackman/orthopedics consulted.   -Started on IV antibiotics (IV Rocephin, Flagyl and vancomycin). -Patient underwent incision and drainage of the left diabetic foot abscess as documented above. -Patient refused repeat incision and drainage on 08/09/2018. -Patient became hypotensive and tachycardic after incision and drainage.   -Aggressive management of sepsis/Sirs was instituted.   -Wound culture grew group B Streptococcus.   -Orthopedic team directed postop management.    Sepsis, present on admission:  -See above.   -MRI of the foot noted. -Orthopedic team directed management of diabetic foot abscess.  Hypokalemia: -Replete.   -Continue to monitor.    Noncompliance: Counseled  Sinus tachycardia: Continue to treat underlying infection. Optimize volume status. Avoid hypotension. Continue to assess and monitor. Have a low threshold to refer patient to cardiology intermittent tachycardia persists. Patient may need evaluation by the electrophysiology team.  Consults: orthopedic surgery  Significant Diagnostic Studies:   Discharge Exam: Blood pressure 102/65, pulse 92, temperature 98.3 F (36.8 C), temperature source Oral, resp. rate 18, height 5\' 11"  (1.803 m), weight 111.1 kg, SpO2 97 %.  Disposition: Discharge disposition: 01-Home or Self Care   Discharge Instructions    Diet - low sodium heart healthy   Complete by:  As directed    Diet Carb Modified   Complete by:  As directed    Increase activity slowly   Complete by:  As directed      Allergies as of 08/05/2018   No Known Allergies     Medication List    STOP taking these  medications  metFORMIN 500 MG tablet Commonly known as:  GLUCOPHAGE   TRESIBA FLEXTOUCH 200 UNIT/ML Sopn Generic drug:  Insulin Degludec   TYLENOL 500 MG tablet Generic drug:  acetaminophen     TAKE these medications   amoxicillin-clavulanate 875-125 MG tablet Commonly known as:  AUGMENTIN Take 1 tablet by mouth 2 (two) times daily for 10 days.   insulin aspart 100 UNIT/ML injection Commonly known as:  novoLOG Inject 4 Units into the skin 3 (three) times daily with meals.   insulin glargine 100 UNIT/ML injection Commonly known as:  LANTUS Inject 0.12 mLs (12 Units total) into the skin 2 (two) times daily.   metoprolol tartrate 25 MG tablet Commonly known as:  LOPRESSOR Take 1 tablet (25 mg total) by mouth 2 (two) times daily.   traMADol 50 MG tablet Commonly known as:  ULTRAM Take 1 tablet (50 mg total) by mouth every 6 (six) hours as needed for moderate pain.      Follow-up Information    Mcarthur Rossetti, MD. Schedule an appointment as soon as possible for a visit on 08/09/2018.   Specialty:  Orthopedic Surgery Contact information: [place] [address] [phone]           Signed: Bonnell Public 08/05/2018, 11:40 AM

## 2018-08-12 ENCOUNTER — Ambulatory Visit (INDEPENDENT_AMBULATORY_CARE_PROVIDER_SITE_OTHER): Payer: Self-pay | Admitting: Physician Assistant

## 2018-08-12 ENCOUNTER — Telehealth (INDEPENDENT_AMBULATORY_CARE_PROVIDER_SITE_OTHER): Payer: Self-pay

## 2018-08-12 NOTE — Telephone Encounter (Signed)
Left voicemail for patient asking if he can come in this coming Monday instead of waiting until the 8th. Dr. Ninfa Linden and Artis Delay states he needs to be seen as soon as possible with them

## 2018-08-16 ENCOUNTER — Ambulatory Visit (INDEPENDENT_AMBULATORY_CARE_PROVIDER_SITE_OTHER): Payer: Self-pay | Admitting: Physician Assistant

## 2018-08-18 ENCOUNTER — Ambulatory Visit (INDEPENDENT_AMBULATORY_CARE_PROVIDER_SITE_OTHER): Payer: Self-pay | Admitting: Physician Assistant

## 2018-08-18 ENCOUNTER — Encounter (INDEPENDENT_AMBULATORY_CARE_PROVIDER_SITE_OTHER): Payer: Self-pay | Admitting: Orthopaedic Surgery

## 2018-08-18 ENCOUNTER — Other Ambulatory Visit (INDEPENDENT_AMBULATORY_CARE_PROVIDER_SITE_OTHER): Payer: Self-pay | Admitting: Family

## 2018-08-18 ENCOUNTER — Ambulatory Visit (INDEPENDENT_AMBULATORY_CARE_PROVIDER_SITE_OTHER): Payer: Self-pay | Admitting: Orthopaedic Surgery

## 2018-08-18 DIAGNOSIS — E11628 Type 2 diabetes mellitus with other skin complications: Secondary | ICD-10-CM

## 2018-08-18 DIAGNOSIS — L089 Local infection of the skin and subcutaneous tissue, unspecified: Secondary | ICD-10-CM

## 2018-08-18 NOTE — Progress Notes (Signed)
The patient is 19 days status post irrigation debridement of a left foot abscess.  I saw him in the hospital and recommended a second surgery however he was having family issues and wanted to be discharged from the hospital.  I gave him explicit instructions to show up in the office on December 30 so we could get him set up for surgery that week.  However he did not show up to the office in the several appointments.  He stated that his wife had a death in the family and he was unable to get back with [place].  He says his blood glucose is been under better control.  On examination of his left foot the dressing that I put on at the time of his discharge is still in place and it saturated.  I removed the dressing.  He has dehiscence of the wound on the dorsum of his foot over his fifth ray.  There is no gross purulence and I was able to clean the tissues up and remove suture.  I did remove skin from the plantar aspect of his foot and got a lot of bleeding tissue.  He understands that he absolutely needs a further surgery.  My recommendation would be 5th ray amputation so he could have the tissue debrided and closed which could hopefully give him the best chance of healing.  He also understands that he needs to be compliant with his blood glucose medications and diet.  I will work on getting him on my partner Dr. Sharol Given schedule for surgery this Friday which is 2 days from now.  I stressed again the importance of him keeping that surgical date in order to try to get his foot to heal.

## 2018-08-19 ENCOUNTER — Encounter (HOSPITAL_COMMUNITY): Payer: Self-pay

## 2018-08-19 ENCOUNTER — Other Ambulatory Visit: Payer: Self-pay

## 2018-08-19 NOTE — Progress Notes (Signed)
Patient denies chest pain, SOB, and being under the care of a cardiologist  Cardiac Cath- denies  Stress - denies  Recent labs - denies  Patient made aware to STOP taking any Aleve, Naproxen, Ibuprofen, Motrin, Advil, Goody's, BC's, all herbal medications, fish oil, and all vitamins.  Patient made aware to take 6 units of lantus at [facility] Patient made aware to take 6 units of lantus DOS if CBG >70 Patient made aware to take 1/2 correction dose of NovoLog DOS for CBG >220 Patient made aware to check CBG when waking up and every 2 hours after, until arrival to Short Stay Patient made aware to treat CBG <70 with 4oz apple or cranberry juice, 4 glucose tabs, or glucose gel. Recheck 15 minutes after treatment. If CBG remains <70 call Short Stay and speak with nurse.   Patient verbalizes understanding of all pre-op instructions

## 2018-08-20 ENCOUNTER — Inpatient Hospital Stay (HOSPITAL_COMMUNITY): Payer: Self-pay | Admitting: Certified Registered Nurse Anesthetist

## 2018-08-20 ENCOUNTER — Ambulatory Visit (HOSPITAL_COMMUNITY)
Admission: RE | Admit: 2018-08-20 | Discharge: 2018-08-20 | Disposition: A | Discharge status: Home / Self Care | Payer: Self-pay | Attending: Orthopedic Surgery | Admitting: Orthopedic Surgery

## 2018-08-20 ENCOUNTER — Encounter (HOSPITAL_COMMUNITY): Payer: Self-pay

## 2018-08-20 ENCOUNTER — Encounter (HOSPITAL_COMMUNITY)
Admission: RE | Disposition: A | Discharge status: Home / Self Care | Payer: Self-pay | Source: Home / Self Care | Attending: Orthopedic Surgery

## 2018-08-20 ENCOUNTER — Other Ambulatory Visit: Payer: Self-pay

## 2018-08-20 DIAGNOSIS — E1140 Type 2 diabetes mellitus with diabetic neuropathy, unspecified: Secondary | ICD-10-CM | POA: Insufficient documentation

## 2018-08-20 DIAGNOSIS — L02612 Cutaneous abscess of left foot: Secondary | ICD-10-CM | POA: Insufficient documentation

## 2018-08-20 DIAGNOSIS — M868X7 Other osteomyelitis, ankle and foot: Secondary | ICD-10-CM | POA: Insufficient documentation

## 2018-08-20 DIAGNOSIS — Z794 Long term (current) use of insulin: Secondary | ICD-10-CM | POA: Insufficient documentation

## 2018-08-20 DIAGNOSIS — L089 Local infection of the skin and subcutaneous tissue, unspecified: Secondary | ICD-10-CM

## 2018-08-20 DIAGNOSIS — E11628 Type 2 diabetes mellitus with other skin complications: Secondary | ICD-10-CM

## 2018-08-20 DIAGNOSIS — Z79899 Other long term (current) drug therapy: Secondary | ICD-10-CM | POA: Insufficient documentation

## 2018-08-20 HISTORY — DX: Unspecified open wound, unspecified foot, initial encounter: S91.309A

## 2018-08-20 HISTORY — DX: Presence of spectacles and contact lenses: Z97.3

## 2018-08-20 HISTORY — PX: AMPUTATION: SHX166

## 2018-08-20 LAB — CBC
HCT: 34.2 % — ABNORMAL LOW (ref 39.0–52.0)
Hemoglobin: 11.1 g/dL — ABNORMAL LOW (ref 13.0–17.0)
MCH: 28.8 pg (ref 26.0–34.0)
MCHC: 32.5 g/dL (ref 30.0–36.0)
MCV: 88.8 fL (ref 80.0–100.0)
Platelets: 270 10*3/uL (ref 150–400)
RBC: 3.85 MIL/uL — ABNORMAL LOW (ref 4.22–5.81)
RDW: 14.7 % (ref 11.5–15.5)
WBC: 3.1 10*3/uL — ABNORMAL LOW (ref 4.0–10.5)
nRBC: 0 % (ref 0.0–0.2)

## 2018-08-20 LAB — BASIC METABOLIC PANEL
Anion gap: 8 (ref 5–15)
BUN: 11 mg/dL (ref 6–20)
CO2: 26 mmol/L (ref 22–32)
Calcium: 8.8 mg/dL — ABNORMAL LOW (ref 8.9–10.3)
Chloride: 101 mmol/L (ref 98–111)
Creatinine, Ser: 0.97 mg/dL (ref 0.61–1.24)
GFR calc Af Amer: >60 mL/min (ref >60)
Glucose, Bld: 281 mg/dL — ABNORMAL HIGH (ref 70–99)
Potassium: 3.7 mmol/L (ref 3.5–5.1)
Sodium: 135 mmol/L (ref 135–145)

## 2018-08-20 LAB — GLUCOSE, CAPILLARY
Glucose-Capillary: 271 mg/dL — ABNORMAL HIGH (ref 70–99)
Glucose-Capillary: 188 mg/dL — ABNORMAL HIGH (ref 70–99)

## 2018-08-20 SURGERY — AMPUTATION, FOOT, RAY
Anesthesia: General | Site: Foot | Laterality: Left

## 2018-08-20 MED ORDER — CHLORHEXIDINE GLUCONATE 4 % EX LIQD: 60.0000 mL | Freq: Once | CUTANEOUS | Status: DC

## 2018-08-20 MED ORDER — LIDOCAINE 2% (20 MG/ML) 5 ML SYRINGE
INTRAMUSCULAR | Status: DC | PRN
  Administered 2018-08-20: 100 mg via INTRAVENOUS

## 2018-08-20 MED ORDER — CEFAZOLIN SODIUM-DEXTROSE 2-4 GM/100ML-% IV SOLN
2.0000 g | INTRAVENOUS | Status: AC
  Administered 2018-08-20: 2 g via INTRAVENOUS
  Filled 2018-08-20: qty 100

## 2018-08-20 MED ORDER — LIDOCAINE 2% (20 MG/ML) 5 ML SYRINGE
INTRAMUSCULAR | Status: AC
  Filled 2018-08-20: qty 5

## 2018-08-20 MED ORDER — FENTANYL CITRATE (PF) 100 MCG/2ML IJ SOLN
25.0000 ug | INTRAMUSCULAR | Status: DC | PRN
  Administered 2018-08-20: 50 ug via INTRAVENOUS

## 2018-08-20 MED ORDER — PROPOFOL 10 MG/ML IV BOLUS
INTRAVENOUS | Status: AC
  Filled 2018-08-20: qty 20

## 2018-08-20 MED ORDER — PHENYLEPHRINE 40 MCG/ML (10ML) SYRINGE FOR IV PUSH (FOR BLOOD PRESSURE SUPPORT)
PREFILLED_SYRINGE | INTRAVENOUS | Status: AC
  Filled 2018-08-20: qty 20

## 2018-08-20 MED ORDER — ONDANSETRON HCL 4 MG/2ML IJ SOLN
INTRAMUSCULAR | Status: DC | PRN
  Administered 2018-08-20: 4 mg via INTRAVENOUS

## 2018-08-20 MED ORDER — OXYCODONE-ACETAMINOPHEN 5-325 MG PO TABS: 1.0000 | ORAL_TABLET | ORAL | 0 refills | Status: DC | PRN

## 2018-08-20 MED ORDER — MIDAZOLAM HCL 5 MG/5ML IJ SOLN
INTRAMUSCULAR | Status: DC | PRN
Start: 2018-08-20 — End: 2018-08-20
  Administered 2018-08-20: 2 mg via INTRAVENOUS

## 2018-08-20 MED ORDER — FENTANYL CITRATE (PF) 100 MCG/2ML IJ SOLN
INTRAMUSCULAR | Status: AC
  Filled 2018-08-20: qty 2

## 2018-08-20 MED ORDER — PROMETHAZINE HCL 25 MG/ML IJ SOLN: 6.2500 mg | INTRAMUSCULAR | Status: DC | PRN

## 2018-08-20 MED ORDER — 0.9 % SODIUM CHLORIDE (POUR BTL) OPTIME
TOPICAL | Status: DC | PRN
  Administered 2018-08-20: 1000 mL

## 2018-08-20 MED ORDER — FENTANYL CITRATE (PF) 250 MCG/5ML IJ SOLN
INTRAMUSCULAR | Status: AC
  Filled 2018-08-20: qty 5

## 2018-08-20 MED ORDER — PROPOFOL 10 MG/ML IV BOLUS
INTRAVENOUS | Status: DC | PRN
  Administered 2018-08-20: 200 mg via INTRAVENOUS

## 2018-08-20 MED ORDER — ONDANSETRON HCL 4 MG/2ML IJ SOLN
INTRAMUSCULAR | Status: AC
  Filled 2018-08-20: qty 2

## 2018-08-20 MED ORDER — FENTANYL CITRATE (PF) 100 MCG/2ML IJ SOLN
INTRAMUSCULAR | Status: DC | PRN
  Administered 2018-08-20 (×3): 50 ug via INTRAVENOUS

## 2018-08-20 MED ORDER — PHENYLEPHRINE 40 MCG/ML (10ML) SYRINGE FOR IV PUSH (FOR BLOOD PRESSURE SUPPORT)
PREFILLED_SYRINGE | INTRAVENOUS | Status: DC | PRN
  Administered 2018-08-20 (×6): 80 ug via INTRAVENOUS

## 2018-08-20 MED ORDER — LACTATED RINGERS IV SOLN
INTRAVENOUS | Status: DC
  Administered 2018-08-20: 12:00:00 via INTRAVENOUS

## 2018-08-20 MED ORDER — MIDAZOLAM HCL 2 MG/2ML IJ SOLN
INTRAMUSCULAR | Status: AC
  Filled 2018-08-20: qty 2

## 2018-08-20 MED FILL — OXYCODONE-ACETAMINOPHEN 5-3: 5-325 | 7 days supply | Qty: 40 | Fill #0

## 2018-08-20 SURGICAL SUPPLY — 31 items
BLADE SAW SGTL MED 73X18.5 STR (BLADE) IMPLANT
BLADE SURG 21 STRL SS (BLADE) ×2 IMPLANT
BNDG COHESIVE 4X5 TAN STRL (GAUZE/BANDAGES/DRESSINGS) ×2 IMPLANT
BNDG GAUZE ELAST 4 BULKY (GAUZE/BANDAGES/DRESSINGS) ×2 IMPLANT
COVER SURGICAL LIGHT HANDLE (MISCELLANEOUS) ×3 IMPLANT
COVER WAND RF STERILE (DRAPES) ×2 IMPLANT
DRAPE U-SHAPE 47X51 STRL (DRAPES) ×3 IMPLANT
DRSG ADAPTIC 3X8 NADH LF (GAUZE/BANDAGES/DRESSINGS) ×2 IMPLANT
DRSG PAD ABDOMINAL 8X10 ST (GAUZE/BANDAGES/DRESSINGS) ×4 IMPLANT
DURAPREP 26ML APPLICATOR (WOUND CARE) ×2 IMPLANT
ELECT REM PT RETURN 9FT ADLT (ELECTROSURGICAL) ×2
ELECTRODE REM PT RTRN 9FT ADLT (ELECTROSURGICAL) ×1 IMPLANT
GAUZE SPONGE 4X4 12PLY STRL (GAUZE/BANDAGES/DRESSINGS) ×2 IMPLANT
GLOVE BIOGEL PI IND STRL 9 (GLOVE) ×1 IMPLANT
GLOVE BIOGEL PI INDICATOR 9 (GLOVE) ×1
GLOVE SURG ORTHO 9.0 STRL STRW (GLOVE) ×2 IMPLANT
GOWN STRL REUS W/ TWL XL LVL3 (GOWN DISPOSABLE) ×2 IMPLANT
GOWN STRL REUS W/TWL XL LVL3 (GOWN DISPOSABLE) ×2
KIT BASIN OR (CUSTOM PROCEDURE TRAY) ×2 IMPLANT
KIT DRSG PREVENA PLUS 7DAY 125 (MISCELLANEOUS) ×1 IMPLANT
KIT PREVENA INCISION MGT 13 (CANNISTER) ×1 IMPLANT
KIT TURNOVER KIT B (KITS) ×2 IMPLANT
NS IRRIG 1000ML POUR BTL (IV SOLUTION) ×2 IMPLANT
PACK ORTHO EXTREMITY (CUSTOM PROCEDURE TRAY) ×2 IMPLANT
PAD ARMBOARD 7.5X6 YLW CONV (MISCELLANEOUS) ×2 IMPLANT
PADDING CAST COTTON 6X4 STRL (CAST SUPPLIES) ×1 IMPLANT
STOCKINETTE IMPERVIOUS LG (DRAPES) IMPLANT
SUT ETHILON 2 0 PSLX (SUTURE) ×2 IMPLANT
TOWEL OR 17X26 10 PK STRL BLUE (TOWEL DISPOSABLE) ×2 IMPLANT
TUBE CONNECTING 12X1/4 (SUCTIONS) ×2 IMPLANT
YANKAUER SUCT BULB TIP NO VENT (SUCTIONS) ×2 IMPLANT

## 2018-08-20 NOTE — Anesthesia Postprocedure Evaluation (Signed)
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)     Patient location during evaluation: PACU Anesthesia Type: General Level of consciousness: awake and alert Pain management: pain level controlled Vital Signs Assessment: post-procedure vital signs reviewed and stable Respiratory status: spontaneous breathing, nonlabored ventilation, respiratory function stable and patient connected to nasal cannula oxygen Cardiovascular status: blood pressure returned to baseline and stable Postop Assessment: no apparent nausea or vomiting Anesthetic complications: no    Last Vitals:  Vitals:   08/20/18 1322 08/20/18 1337  BP: 125/82 132/89  Pulse: 88 88  Resp: 20 18  Temp:  36.6 C  SpO2: 96% 95%    Last Pain:  Vitals:   08/20/18 1338  TempSrc:   PainSc: 3                  Tiajuana Amass

## 2018-08-20 NOTE — Transfer of Care (Signed)
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  Last Pain:  Vitals:   08/20/18 1008  TempSrc:   PainSc: 0-No pain         Complications: No apparent anesthesia complications

## 2018-08-20 NOTE — Anesthesia Procedure Notes (Signed)
Procedure Name: LMA Insertion Date/Time: 08/20/2018 12:24 PM Performed by: Alain Marion, CRNA Pre-anesthesia Checklist: Patient identified, Emergency Drugs available, Suction available and Patient being monitored Patient Re-evaluated:Patient Re-evaluated prior to induction Oxygen Delivery Method: Circle System Utilized Preoxygenation: Pre-oxygenation with 100% oxygen Induction Type: IV induction Ventilation: Mask ventilation without difficulty LMA: LMA inserted LMA Size: 5.0 Number of attempts: 1 Airway Equipment and Method: Bite block Placement Confirmation: positive ETCO2 Tube secured with: Tape Dental Injury: Teeth and Oropharynx as per pre-operative assessment

## 2018-08-20 NOTE — Progress Notes (Addendum)
Last BP check 175/96. Per Dr. Ola Spurr- will continue to monitor.  Rechecked patients BP- 188/111, and 177/105. Patient very anxious- took metoprolol at 0830. Called Dr. Ola Spurr- to come evaluate.

## 2018-08-20 NOTE — Progress Notes (Signed)
Orthopedic Tech Progress Note Patient Details:  Taylor Hughes 1971-07-31 [id number]  Ortho Devices Type of Ortho Device: Crutches, Postop shoe/boot Ortho Device/Splint Location: lle Ortho Device/Splint Interventions: Ordered, Application, Adjustment   Post Interventions Patient Tolerated: Well Instructions Provided: Care of device, Adjustment of device   Karolee Stamps 08/20/2018, 1:42 PM

## 2018-08-20 NOTE — Op Note (Signed)
08/20/2018  1:14 PM  PATIENT:  Taylor Hughes    PRE-OPERATIVE DIAGNOSIS:  left diabetic foot wound  POST-OPERATIVE DIAGNOSIS:  Same  PROCEDURE:  LEFT FOOT IRRIGATON AND DEBRIDEMENT, 5TH RAY AMPUTATION Local tissue rearrangement for wound closure 5 x 9 cm, Application of 13 cm Praveena wound VAC. Application of compressive wrap from the toes to the tibial tubercle.  SURGEON:  Newt Minion, MD  PHYSICIAN ASSISTANT:None ANESTHESIA:   General  PREOPERATIVE INDICATIONS:  Taylor Hughes is a  48 y.o. male with a diagnosis of left diabetic foot wound who failed conservative measures and elected for surgical management.    The risks benefits and alternatives were discussed with the patient preoperatively including but not limited to the risks of infection, bleeding, nerve injury, cardiopulmonary complications, the need for revision surgery, among others, and the patient was willing to proceed.  OPERATIVE IMPLANTS: Praveena wound VAC  @ENCIMAGES @  OPERATIVE FINDINGS: Extensive soft tissue necrosis  OPERATIVE PROCEDURE: Patient was brought the operating room and underwent a general anesthetic.  After adequate levels anesthesia were obtained patient's left lower extremity was prepped using DuraPrep draped into a sterile field a timeout was called.  An elliptical incision was made around the necrotic tissue which encompassed the fifth ray the fifth ray was resected through the base.  This left a wound that was 5 x 9 cm.  The wound was irrigated normal saline hemostasis was obtained there was good petechial bleeding no abscess no necrotic tissue remained.  Local tissue rearrangement was used to close the wound with 2-0 nylon 5 x 9 cm wound.  The Praveena wound VAC was applied this had a good suction fit patient was extubated taken the PACU in stable condition.   DISCHARGE PLANNING:  Antibiotic duration: Preoperative antibiotics  Weightbearing: Nonweightbearing on the left  Pain medication:  Prescription for Percocet  Dressing care/ Wound VAC: Continue wound VAC until follow-up  Ambulatory devices: Walker or crutches  Discharge to: Home.  Follow-up: In the office 1 week post operative.

## 2018-08-20 NOTE — H&P (Signed)
Taylor Hughes is an 48 y.o. male.   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 He presented to the office for follow up after missing several appointments. The wound is not healing and  it was felt he would require left foot 5th ray amputation and he presents for this procedure today.   Past Medical History:  Diagnosis Date  . Diabetes mellitus without complication ([place])   . Wears glasses   . Wound, open, foot    left diabetic     Past Surgical History:  Procedure Laterality Date  . I&D EXTREMITY Left 07/30/2018   Procedure: IRRIGATION AND DEBRIDEMENT LEFT FOOT WITH POSSIBLE AMPUTATION OF FIFTH TOE;  Surgeon: Mcarthur Rossetti, MD;  Location: [facility];  Service: Orthopedics;  Laterality: Left;    Family History  Problem Relation Age of Onset  . Diabetes Mother    Social History:  reports that he has never smoked. He has never used smokeless tobacco. He reports that he does not drink alcohol or use drugs.  Allergies: No Known Allergies  No medications prior to admission.    No results found for this or any previous visit (from the past 48 hour(s)). No results found.  Review of Systems  All other systems reviewed and are negative.   There were no vitals taken for this visit. Physical Exam  Constitutional: He is oriented to person, place, and time. He appears well-developed and well-nourished. No distress.  HENT:  Head: Normocephalic and atraumatic.  Neck: No tracheal deviation present. No thyromegaly present.  Cardiovascular: Normal rate and regular rhythm.  Respiratory: Effort normal. No respiratory distress.  GI: Soft. He exhibits no distension.  Neurological: He is alert and oriented to person, place, and time. No cranial nerve deficit. He exhibits normal muscle tone. Coordination normal.  Skin: Skin is warm.   Psychiatric: He has a normal mood and affect. His behavior is normal. Judgment and thought content normal.     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  This procedure has been fully reviewed with the patient and written informed consent has been obtained.   Erlinda Hong, PA-C 08/20/2018, 7:19 AM [place]

## 2018-08-20 NOTE — Anesthesia Preprocedure Evaluation (Signed)
Anesthesia Evaluation  Patient identified by MRN, date of birth, ID band Patient awake    Reviewed: Allergy & Precautions, NPO status , Patient's Chart, lab work & pertinent test results  Airway Mallampati: II       Dental no notable dental hx. (+) Teeth Intact   Pulmonary neg pulmonary ROS,    Pulmonary exam normal breath sounds clear to auscultation       Cardiovascular negative cardio ROS Normal cardiovascular exam Rhythm:Regular Rate:Normal     Neuro/Psych negative neurological ROS  negative psych ROS   GI/Hepatic negative GI ROS, Neg liver ROS,   Endo/Other  diabetes, Insulin Dependent, Oral Hypoglycemic Agents  Renal/GU negative Renal ROS  negative genitourinary   Musculoskeletal   Abdominal (+) + obese,   Peds  Hematology  (+) anemia ,   Anesthesia Other Findings   Reproductive/Obstetrics                             Lab Results  Component Value Date   WBC 3.1 (L) 08/20/2018   HGB 11.1 (L) 08/20/2018   HCT 34.2 (L) 08/20/2018   MCV 88.8 08/20/2018   PLT 270 08/20/2018    Anesthesia Physical  Anesthesia Plan  ASA: III  Anesthesia Plan: General   Post-op Pain Management:    Induction: Intravenous  PONV Risk Score and Plan: 1  Airway Management Planned: LMA  Additional Equipment:   Intra-op Plan:   Post-operative Plan: Extubation in OR  Informed Consent: I have reviewed the patients History and Physical, chart, labs and discussed the procedure including the risks, benefits and alternatives for the proposed anesthesia with the patient or authorized representative who has indicated his/her understanding and acceptance.     Plan Discussed with: CRNA  Anesthesia Plan Comments:         Anesthesia Quick Evaluation

## 2018-08-21 ENCOUNTER — Encounter (HOSPITAL_COMMUNITY): Payer: Self-pay | Admitting: Orthopedic Surgery

## 2018-08-27 ENCOUNTER — Encounter (INDEPENDENT_AMBULATORY_CARE_PROVIDER_SITE_OTHER): Payer: Self-pay | Admitting: Family Medicine

## 2018-08-27 ENCOUNTER — Inpatient Hospital Stay (INDEPENDENT_AMBULATORY_CARE_PROVIDER_SITE_OTHER): Payer: Self-pay | Admitting: Physician Assistant

## 2018-08-27 ENCOUNTER — Ambulatory Visit (INDEPENDENT_AMBULATORY_CARE_PROVIDER_SITE_OTHER): Payer: Self-pay | Admitting: Family Medicine

## 2018-08-27 VITALS — Ht 71.0 in | Wt 222.0 lb

## 2018-08-27 DIAGNOSIS — L089 Local infection of the skin and subcutaneous tissue, unspecified: Secondary | ICD-10-CM

## 2018-08-27 DIAGNOSIS — E11628 Type 2 diabetes mellitus with other skin complications: Secondary | ICD-10-CM

## 2018-08-27 MED ORDER — DOXYCYCLINE HYCLATE 100 MG PO TABS: 100.0000 mg | ORAL_TABLET | Freq: Two times a day (BID) | ORAL | 0 refills | Status: DC

## 2018-08-27 MED FILL — DOXYCYCLINE HYCLATE 100 MG: 100 | 30 days supply | Qty: 60 | Fill #0

## 2018-08-27 NOTE — Progress Notes (Signed)
Office Visit Note   Patient: Taylor Hughes           Date of Birth: 09-Mar-1971           MRN: [id number] Visit Date: 08/27/2018              Requested by: Jilda Panda, MD [id number], [place] [address] PCP: Jilda Panda, MD  Chief Complaint  Patient presents with  . Left Foot - Routine Post Op    08/20/2018 I&D left foot 5th ray amputation. Wound vac removed alarming and no suction. Pt weight bearing on foot with post op shoe. Excessive amt bloody drainage.       HPI: Patient is a 48 year old gentleman status post foot salvage intervention left with 1/5 ray amputation.  Patient was supposed to be nonweightbearing he presents full weightbearing with a wound VAC that is not working.  Assessment & Plan: Visit Diagnoses:  1. Diabetic infection of left foot ([place])     Plan: Patient is to start Dial soap cleansing daily dry dressing change daily with an Ace wrap from the metatarsal heads to the tibial tubercle.  Discussed the importance of nonweightbearing and elevation.  Discussed the risk of potential amputation with noncompliance.  Will follow-up Thursday.  A sterile dressing was applied we will call in a prescription for doxycycline to [facility].  Follow-Up Instructions: Return in about 1 week (around 09/03/2018).   Ortho Exam  Patient is alert, oriented, no adenopathy, well-dressed, normal affect, normal respiratory effort. Examination patient has massive venous stasis swelling of the left leg with pitting edema.  There is a large hematoma beneath the wound VAC dressing.  There is a large area of necrotic skin dorsally over the foot where the wound was previously closed this wound is now 7 x 4 cm.  There is no good granulation tissue at this time.    Imaging: No results found. No images are attached to the encounter.  Labs: Lab Results  Component Value Date   HGBA1C 9.6 (H) 07/28/2018   REPTSTATUS 08/04/2018 FINAL 07/30/2018   GRAMSTAIN   07/30/2018    NO WBC SEEN ABUNDANT GRAM POSITIVE COCCI ABUNDANT GRAM NEGATIVE RODS    CULT  07/30/2018    MODERATE GROUP B STREP(S.AGALACTIAE)ISOLATED TESTING AGAINST S. AGALACTIAE NOT ROUTINELY PERFORMED DUE TO PREDICTABILITY OF AMP/PEN/VAN SUSCEPTIBILITY. MIXED ANAEROBIC FLORA PRESENT.  CALL LAB IF FURTHER IID REQUIRED.    LABORGA Moderate GROUP B STREP (S.AGALACTIAE) ISOLATED 08/03/2015     Lab Results  Component Value Date   ALBUMIN 1.9 (L) 08/05/2018   ALBUMIN 1.8 (L) 08/04/2018   ALBUMIN 2.2 (L) 08/03/2018   ALBUMIN 2.1 (L) 08/03/2018    Body mass index is 30.96 kg/m.  Orders:  No orders of the defined types were placed in this encounter.  No orders of the defined types were placed in this encounter.    Procedures: No procedures performed  Clinical Data: No additional findings.  ROS:  All other systems negative, except as noted in the HPI. Review of Systems  Objective: Vital Signs: Ht 5\' 11"  (1.803 m)   Wt 222 lb (100.7 kg)   BMI 30.96 kg/m   Specialty Comments:  No specialty comments available.  PMFS History: Patient Active Problem List   Diagnosis Date Noted  . Sepsis due to group B Streptococcus ([place])   . Diabetic infection of left foot ([place])   . DKA (diabetic ketoacidoses) ([place]) 07/30/2018  . Left foot infection   .  DKA, type 2 ([place]) 07/28/2018  . Leukocytosis 07/28/2018  . Hyperkalemia 07/28/2018  . Hyponatremia 07/28/2018   Past Medical History:  Diagnosis Date  . Diabetes mellitus without complication ([place])   . Wears glasses   . Wound, open, foot    left diabetic     Family History  Problem Relation Age of Onset  . Diabetes Mother     Past Surgical History:  Procedure Laterality Date  . AMPUTATION Left 08/20/2018   Procedure: LEFT FOOT IRRIGATON AND DEBRIDEMENT, 5TH RAY AMPUTATION;  Surgeon: Newt Minion, MD;  Location: [facility];  Service: Orthopedics;  Laterality: Left;  . I&D EXTREMITY Left 07/30/2018   Procedure: IRRIGATION  AND DEBRIDEMENT LEFT FOOT WITH POSSIBLE AMPUTATION OF FIFTH TOE;  Surgeon: Mcarthur Rossetti, MD;  Location: [facility];  Service: Orthopedics;  Laterality: Left;   Social History   Occupational History  . Not on file  Tobacco Use  . Smoking status: Never Smoker  . Smokeless tobacco: Never Used  Substance and Sexual Activity  . Alcohol use: No    Alcohol/week: 0.0 standard drinks  . Drug use: No  . Sexual activity: Not on file

## 2018-09-02 ENCOUNTER — Encounter (INDEPENDENT_AMBULATORY_CARE_PROVIDER_SITE_OTHER): Payer: Self-pay | Admitting: Orthopedic Surgery

## 2018-09-02 ENCOUNTER — Ambulatory Visit (INDEPENDENT_AMBULATORY_CARE_PROVIDER_SITE_OTHER): Payer: Self-pay | Admitting: Orthopedic Surgery

## 2018-09-02 VITALS — Ht 71.0 in | Wt 222.0 lb

## 2018-09-02 DIAGNOSIS — E11628 Type 2 diabetes mellitus with other skin complications: Secondary | ICD-10-CM

## 2018-09-02 DIAGNOSIS — Z89422 Acquired absence of other left toe(s): Secondary | ICD-10-CM

## 2018-09-02 DIAGNOSIS — L089 Local infection of the skin and subcutaneous tissue, unspecified: Secondary | ICD-10-CM

## 2018-09-09 ENCOUNTER — Ambulatory Visit (INDEPENDENT_AMBULATORY_CARE_PROVIDER_SITE_OTHER): Payer: Self-pay | Admitting: Orthopedic Surgery

## 2018-09-13 ENCOUNTER — Encounter (INDEPENDENT_AMBULATORY_CARE_PROVIDER_SITE_OTHER): Payer: Self-pay | Admitting: Orthopedic Surgery

## 2018-09-13 NOTE — Progress Notes (Signed)
Office Visit Note   Patient: Taylor Hughes           Date of Birth: 20-Aug-1970           MRN: [id number] Visit Date: 09/02/2018              Requested by: Jilda Panda, MD [id number], [place] [address] PCP: Jilda Panda, MD  Chief Complaint  Patient presents with  . Left Foot - Routine Post Op      HPI: Patient is a 48 year old gentleman who is seen in follow-up status post left foot fifth ray amputation.  Patient has dry skin and some bleeding and odor.  Assessment & Plan: Visit Diagnoses:  1. Diabetic infection of left foot ([place])     Plan: We will start Dial soap dressing changes discussed the importance of elevation and nonweightbearing.  Discussed the critical need to follow-up with a primary care physician for control of the multiple medical problems.  Dry dressing change daily.  Have discussed that with the patient's multiple medical problems he should be permanently disabled.  Patient has uncontrolled diabetes with a hemoglobin A1c of 9.6 and severe protein caloric malnutrition with an albumin of 1.9.  Follow-Up Instructions: Return in about 1 week (around 09/09/2018).   Ortho Exam  Patient is alert, oriented, no adenopathy, well-dressed, normal affect, normal respiratory effort. Examination there is a wound 3 x 7 mm with 100% granulation tissue.  Sutures are harvested no clinical signs of infection.  No redness no cellulitis.  Imaging: No results found. No images are attached to the encounter.  Labs: Lab Results  Component Value Date   HGBA1C 9.6 (H) 07/28/2018   REPTSTATUS 08/04/2018 FINAL 07/30/2018   GRAMSTAIN  07/30/2018    NO WBC SEEN ABUNDANT GRAM POSITIVE COCCI ABUNDANT GRAM NEGATIVE RODS    CULT  07/30/2018    MODERATE GROUP B STREP(S.AGALACTIAE)ISOLATED TESTING AGAINST S. AGALACTIAE NOT ROUTINELY PERFORMED DUE TO PREDICTABILITY OF AMP/PEN/VAN SUSCEPTIBILITY. MIXED ANAEROBIC FLORA PRESENT.  CALL LAB IF FURTHER IID REQUIRED.    LABORGA  Moderate GROUP B STREP (S.AGALACTIAE) ISOLATED 08/03/2015     Lab Results  Component Value Date   ALBUMIN 1.9 (L) 08/05/2018   ALBUMIN 1.8 (L) 08/04/2018   ALBUMIN 2.2 (L) 08/03/2018   ALBUMIN 2.1 (L) 08/03/2018    Body mass index is 30.96 kg/m.  Orders:  No orders of the defined types were placed in this encounter.  No orders of the defined types were placed in this encounter.    Procedures: No procedures performed  Clinical Data: No additional findings.  ROS:  All other systems negative, except as noted in the HPI. Review of Systems  Objective: Vital Signs: Ht 5\' 11"  (1.803 m)   Wt 222 lb (100.7 kg)   BMI 30.96 kg/m   Specialty Comments:  No specialty comments available.  PMFS History: Patient Active Problem List   Diagnosis Date Noted  . Sepsis due to group B Streptococcus ([place])   . Diabetic infection of left foot ([place])   . DKA (diabetic ketoacidoses) ([place]) 07/30/2018  . Left foot infection   . DKA, type 2 ([place]) 07/28/2018  . Leukocytosis 07/28/2018  . Hyperkalemia 07/28/2018  . Hyponatremia 07/28/2018   Past Medical History:  Diagnosis Date  . Diabetes mellitus without complication ([place])   . Wears glasses   . Wound, open, foot    left diabetic     Family History  Problem Relation Age of Onset  . Diabetes Mother  Past Surgical History:  Procedure Laterality Date  . AMPUTATION Left 08/20/2018   Procedure: LEFT FOOT IRRIGATON AND DEBRIDEMENT, 5TH RAY AMPUTATION;  Surgeon: Newt Minion, MD;  Location: [facility];  Service: Orthopedics;  Laterality: Left;  . I&D EXTREMITY Left 07/30/2018   Procedure: IRRIGATION AND DEBRIDEMENT LEFT FOOT WITH POSSIBLE AMPUTATION OF FIFTH TOE;  Surgeon: Mcarthur Rossetti, MD;  Location: [facility];  Service: Orthopedics;  Laterality: Left;   Social History   Occupational History  . Not on file  Tobacco Use  . Smoking status: Never Smoker  . Smokeless tobacco: Never Used  Substance and Sexual Activity  .  Alcohol use: No    Alcohol/week: 0.0 standard drinks  . Drug use: No  . Sexual activity: Not on file

## 2018-09-16 ENCOUNTER — Ambulatory Visit: Payer: Self-pay | Admitting: Family Medicine

## 2018-09-17 ENCOUNTER — Ambulatory Visit (INDEPENDENT_AMBULATORY_CARE_PROVIDER_SITE_OTHER): Payer: Self-pay | Admitting: Physician Assistant

## 2018-09-24 ENCOUNTER — Ambulatory Visit (INDEPENDENT_AMBULATORY_CARE_PROVIDER_SITE_OTHER): Payer: Self-pay | Admitting: Physician Assistant

## 2018-10-01 ENCOUNTER — Ambulatory Visit (INDEPENDENT_AMBULATORY_CARE_PROVIDER_SITE_OTHER): Payer: Self-pay | Admitting: Physician Assistant

## 2018-10-04 MED FILL — LANTUS 100 UNITS/ML VIAL: 100 | 28 days supply | Qty: 10 | Fill #1

## 2018-10-04 MED FILL — NovoLOG 100 UNIT/ML SOLN: 100 | 28 days supply | Qty: 10 | Fill #1

## 2018-10-08 ENCOUNTER — Ambulatory Visit (INDEPENDENT_AMBULATORY_CARE_PROVIDER_SITE_OTHER): Payer: Self-pay | Admitting: Physician Assistant

## 2018-10-21 ENCOUNTER — Ambulatory Visit (INDEPENDENT_AMBULATORY_CARE_PROVIDER_SITE_OTHER): Payer: Self-pay | Admitting: Physician Assistant

## 2018-11-22 ENCOUNTER — Emergency Department (HOSPITAL_COMMUNITY): Payer: Self-pay

## 2018-11-22 ENCOUNTER — Inpatient Hospital Stay (HOSPITAL_COMMUNITY)
Admission: EM | Admit: 2018-11-22 | Discharge: 2018-11-29 | DRG: 853 | Disposition: A | Discharge status: Home Health | Payer: Self-pay | Attending: Internal Medicine | Admitting: Internal Medicine

## 2018-11-22 ENCOUNTER — Encounter (HOSPITAL_COMMUNITY)
Admission: EM | Disposition: A | Discharge status: Home Health | Payer: Self-pay | Source: Home / Self Care | Attending: Internal Medicine

## 2018-11-22 ENCOUNTER — Other Ambulatory Visit: Payer: Self-pay

## 2018-11-22 ENCOUNTER — Inpatient Hospital Stay (HOSPITAL_COMMUNITY): Payer: Self-pay | Admitting: Certified Registered Nurse Anesthetist

## 2018-11-22 ENCOUNTER — Encounter (HOSPITAL_COMMUNITY): Payer: Self-pay

## 2018-11-22 ENCOUNTER — Ambulatory Visit (HOSPITAL_COMMUNITY): Payer: Self-pay

## 2018-11-22 DIAGNOSIS — L03313 Cellulitis of chest wall: Secondary | ICD-10-CM | POA: Diagnosis present

## 2018-11-22 DIAGNOSIS — E669 Obesity, unspecified: Secondary | ICD-10-CM | POA: Diagnosis present

## 2018-11-22 DIAGNOSIS — I10 Essential (primary) hypertension: Secondary | ICD-10-CM | POA: Diagnosis present

## 2018-11-22 DIAGNOSIS — E11649 Type 2 diabetes mellitus with hypoglycemia without coma: Secondary | ICD-10-CM | POA: Diagnosis not present

## 2018-11-22 DIAGNOSIS — I4891 Unspecified atrial fibrillation: Secondary | ICD-10-CM | POA: Diagnosis not present

## 2018-11-22 DIAGNOSIS — R609 Edema, unspecified: Secondary | ICD-10-CM

## 2018-11-22 DIAGNOSIS — A419 Sepsis, unspecified organism: Principal | ICD-10-CM | POA: Diagnosis present

## 2018-11-22 DIAGNOSIS — E876 Hypokalemia: Secondary | ICD-10-CM | POA: Diagnosis not present

## 2018-11-22 DIAGNOSIS — T383X6A Underdosing of insulin and oral hypoglycemic [antidiabetic] drugs, initial encounter: Secondary | ICD-10-CM | POA: Diagnosis present

## 2018-11-22 DIAGNOSIS — R079 Chest pain, unspecified: Secondary | ICD-10-CM

## 2018-11-22 DIAGNOSIS — R0602 Shortness of breath: Secondary | ICD-10-CM

## 2018-11-22 DIAGNOSIS — E1110 Type 2 diabetes mellitus with ketoacidosis without coma: Secondary | ICD-10-CM | POA: Diagnosis present

## 2018-11-22 DIAGNOSIS — E871 Hypo-osmolality and hyponatremia: Secondary | ICD-10-CM | POA: Diagnosis present

## 2018-11-22 DIAGNOSIS — E1310 Other specified diabetes mellitus with ketoacidosis without coma: Secondary | ICD-10-CM

## 2018-11-22 DIAGNOSIS — E860 Dehydration: Secondary | ICD-10-CM | POA: Diagnosis present

## 2018-11-22 DIAGNOSIS — E1169 Type 2 diabetes mellitus with other specified complication: Secondary | ICD-10-CM | POA: Diagnosis present

## 2018-11-22 DIAGNOSIS — Z794 Long term (current) use of insulin: Secondary | ICD-10-CM

## 2018-11-22 DIAGNOSIS — Z6830 Body mass index (BMI) 30.0-30.9, adult: Secondary | ICD-10-CM

## 2018-11-22 DIAGNOSIS — Z833 Family history of diabetes mellitus: Secondary | ICD-10-CM

## 2018-11-22 DIAGNOSIS — L0211 Cutaneous abscess of neck: Secondary | ICD-10-CM | POA: Diagnosis present

## 2018-11-22 DIAGNOSIS — M726 Necrotizing fasciitis: Secondary | ICD-10-CM | POA: Diagnosis present

## 2018-11-22 DIAGNOSIS — E875 Hyperkalemia: Secondary | ICD-10-CM | POA: Diagnosis present

## 2018-11-22 DIAGNOSIS — D638 Anemia in other chronic diseases classified elsewhere: Secondary | ICD-10-CM | POA: Diagnosis present

## 2018-11-22 DIAGNOSIS — N179 Acute kidney failure, unspecified: Secondary | ICD-10-CM | POA: Diagnosis present

## 2018-11-22 HISTORY — PX: MINOR IRRIGATION AND DEBRIDEMENT OF WOUND: SHX6239

## 2018-11-22 LAB — POCT I-STAT 4, (NA,K, GLUC, HGB,HCT)
Glucose, Bld: 403 mg/dL — ABNORMAL HIGH (ref 70–99)
HCT: 41 % (ref 39.0–52.0)
Hemoglobin: 13.9 g/dL (ref 13.0–17.0)
Potassium: 4.6 mmol/L (ref 3.5–5.1)
Sodium: 133 mmol/L — ABNORMAL LOW (ref 135–145)

## 2018-11-22 LAB — COMPREHENSIVE METABOLIC PANEL
ALT: 13 U/L (ref 0–44)
AST: 17 U/L (ref 15–41)
Albumin: 3.2 g/dL — ABNORMAL LOW (ref 3.5–5.0)
Alkaline Phosphatase: 138 U/L — ABNORMAL HIGH (ref 38–126)
Anion gap: 23 — ABNORMAL HIGH (ref 5–15)
BUN: 20 mg/dL (ref 6–20)
CO2: 9 mmol/L — ABNORMAL LOW (ref 22–32)
Calcium: 9.1 mg/dL (ref 8.9–10.3)
Chloride: 93 mmol/L — ABNORMAL LOW (ref 98–111)
Creatinine, Ser: 1.39 mg/dL — ABNORMAL HIGH (ref 0.61–1.24)
GFR calc Af Amer: >60 mL/min (ref >60)
GFR calc non Af Amer: >60 mL/min (ref >60)
Glucose, Bld: 689 mg/dL (ref 70–99)
Potassium: 5.5 mmol/L — ABNORMAL HIGH (ref 3.5–5.1)
Sodium: 125 mmol/L — ABNORMAL LOW (ref 135–145)
Total Bilirubin: 1.7 mg/dL — ABNORMAL HIGH (ref 0.3–1.2)
Total Protein: 8.9 g/dL — ABNORMAL HIGH (ref 6.5–8.1)

## 2018-11-22 LAB — TROPONIN I
Troponin I: <0.03 ng/mL (ref <0.03)
Troponin I: <0.03 ng/mL

## 2018-11-22 LAB — CBC WITH DIFFERENTIAL/PLATELET
Abs Immature Granulocytes: 0.35 10*3/uL — ABNORMAL HIGH (ref 0.00–0.07)
Basophils Absolute: 0.1 10*3/uL (ref 0.0–0.1)
Basophils Relative: 0 %
Eosinophils Absolute: 0.1 10*3/uL (ref 0.0–0.5)
Eosinophils Relative: 0 %
HCT: 39.1 % (ref 39.0–52.0)
Hemoglobin: 12.1 g/dL — ABNORMAL LOW (ref 13.0–17.0)
Immature Granulocytes: 1 %
Lymphocytes Relative: 3 %
Lymphs Abs: 0.7 10*3/uL (ref 0.7–4.0)
MCH: 29 pg (ref 26.0–34.0)
MCHC: 30.9 g/dL (ref 30.0–36.0)
MCV: 93.8 fL (ref 80.0–100.0)
Monocytes Absolute: 1.8 10*3/uL — ABNORMAL HIGH (ref 0.1–1.0)
Monocytes Relative: 7 %
Neutro Abs: 23.6 10*3/uL — ABNORMAL HIGH (ref 1.7–7.7)
Neutrophils Relative %: 89 %
Platelets: 300 10*3/uL (ref 150–400)
RBC: 4.17 MIL/uL — ABNORMAL LOW (ref 4.22–5.81)
RDW: 16.8 % — ABNORMAL HIGH (ref 11.5–15.5)
WBC: 26.6 10*3/uL — ABNORMAL HIGH (ref 4.0–10.5)
nRBC: 0 % (ref 0.0–0.2)

## 2018-11-22 LAB — URINALYSIS, ROUTINE W REFLEX MICROSCOPIC
Bilirubin Urine: NEGATIVE
Glucose, UA: >=500 mg/dL — AB
Ketones, ur: 80 mg/dL — AB
Leukocytes,Ua: NEGATIVE
Nitrite: NEGATIVE
Protein, ur: NEGATIVE mg/dL
Specific Gravity, Urine: 1.023 (ref 1.005–1.030)
pH: 5 (ref 5.0–8.0)

## 2018-11-22 LAB — BASIC METABOLIC PANEL WITH GFR
Anion gap: 12 (ref 5–15)
BUN: 18 mg/dL (ref 6–20)
CO2: 16 mmol/L — ABNORMAL LOW (ref 22–32)
Calcium: 8.7 mg/dL — ABNORMAL LOW (ref 8.9–10.3)
Chloride: 104 mmol/L (ref 98–111)
Creatinine, Ser: 1.04 mg/dL (ref 0.61–1.24)
GFR calc Af Amer: >60 mL/min
GFR calc non Af Amer: >60 mL/min
Glucose, Bld: 303 mg/dL — ABNORMAL HIGH (ref 70–99)
Potassium: 4.1 mmol/L (ref 3.5–5.1)
Sodium: 132 mmol/L — ABNORMAL LOW (ref 135–145)

## 2018-11-22 LAB — PROTIME-INR
INR: 1.2 (ref 0.8–1.2)
Prothrombin Time: 15.3 seconds — ABNORMAL HIGH (ref 11.4–15.2)

## 2018-11-22 LAB — GLUCOSE, CAPILLARY
Glucose-Capillary: 405 mg/dL — ABNORMAL HIGH (ref 70–99)
Glucose-Capillary: 284 mg/dL — ABNORMAL HIGH (ref 70–99)
Glucose-Capillary: 292 mg/dL — ABNORMAL HIGH (ref 70–99)
Glucose-Capillary: 211 mg/dL — ABNORMAL HIGH (ref 70–99)
Glucose-Capillary: 191 mg/dL — ABNORMAL HIGH (ref 70–99)
Glucose-Capillary: 145 mg/dL — ABNORMAL HIGH (ref 70–99)
Glucose-Capillary: 132 mg/dL — ABNORMAL HIGH (ref 70–99)
Glucose-Capillary: 125 mg/dL — ABNORMAL HIGH (ref 70–99)
Glucose-Capillary: 125 mg/dL — ABNORMAL HIGH (ref 70–99)

## 2018-11-22 LAB — BLOOD GAS, VENOUS
Acid-base deficit: 17.6 mmol/L — ABNORMAL HIGH (ref 0.0–2.0)
Bicarbonate: 9.3 mmol/L — ABNORMAL LOW (ref 20.0–28.0)
O2 Saturation: 71.5 %
Patient temperature: 98.6
pCO2, Ven: 25.7 mmHg — ABNORMAL LOW (ref 44.0–60.0)
pH, Ven: 7.186 — CL (ref 7.250–7.430)
pO2, Ven: 46.4 mmHg — ABNORMAL HIGH (ref 32.0–45.0)

## 2018-11-22 LAB — BASIC METABOLIC PANEL
Anion gap: 9 (ref 5–15)
BUN: 18 mg/dL (ref 6–20)
CO2: 19 mmol/L — ABNORMAL LOW (ref 22–32)
Calcium: 8.9 mg/dL (ref 8.9–10.3)
Chloride: 107 mmol/L (ref 98–111)
Creatinine, Ser: 0.89 mg/dL (ref 0.61–1.24)
GFR calc Af Amer: >60 mL/min (ref >60)
GFR calc non Af Amer: >60 mL/min (ref >60)
Glucose, Bld: 170 mg/dL — ABNORMAL HIGH (ref 70–99)
Potassium: 3.9 mmol/L (ref 3.5–5.1)
Sodium: 135 mmol/L (ref 135–145)

## 2018-11-22 LAB — PROCALCITONIN: Procalcitonin: 2.4 ng/mL

## 2018-11-22 LAB — CBG MONITORING, ED
Glucose-Capillary: 556 mg/dL (ref 70–99)
Glucose-Capillary: 462 mg/dL — ABNORMAL HIGH (ref 70–99)
Glucose-Capillary: 418 mg/dL — ABNORMAL HIGH (ref 70–99)

## 2018-11-22 LAB — LACTIC ACID, PLASMA
Lactic Acid, Venous: 1.8 mmol/L (ref 0.5–1.9)
Lactic Acid, Venous: 1.5 mmol/L (ref 0.5–1.9)

## 2018-11-22 LAB — MRSA PCR SCREENING: MRSA by PCR: NEGATIVE

## 2018-11-22 SURGERY — MINOR IRRIGATION AND DEBRIDEMENT OF WOUND
Anesthesia: General | Site: Neck

## 2018-11-22 MED ORDER — LIDOCAINE-EPINEPHRINE 1 %-1:100000 IJ SOLN
INTRAMUSCULAR | Status: DC | PRN
  Administered 2018-11-22: 3 mL

## 2018-11-22 MED ORDER — ACETAMINOPHEN 160 MG/5ML PO SOLN: 325.0000 mg | Freq: Once | ORAL | Status: DC

## 2018-11-22 MED ORDER — LIDOCAINE-EPINEPHRINE (PF) 1 %-1:200000 IJ SOLN
INTRAMUSCULAR | Status: AC
  Filled 2018-11-22: qty 30

## 2018-11-22 MED ORDER — INSULIN ASPART 100 UNIT/ML ~~LOC~~ SOLN
10.0000 [IU] | Freq: Once | SUBCUTANEOUS | Status: AC
  Administered 2018-11-22: 10 [IU] via INTRAVENOUS

## 2018-11-22 MED ORDER — FENTANYL CITRATE (PF) 100 MCG/2ML IJ SOLN
INTRAMUSCULAR | Status: AC
  Filled 2018-11-22: qty 2

## 2018-11-22 MED ORDER — HYDROMORPHONE HCL 1 MG/ML IJ SOLN
INTRAMUSCULAR | Status: AC
  Administered 2018-11-22: 16:00:00 0.5 mg via INTRAVENOUS
  Filled 2018-11-22: qty 1

## 2018-11-22 MED ORDER — ONDANSETRON HCL 4 MG/2ML IJ SOLN: 4.0000 mg | Freq: Four times a day (QID) | INTRAMUSCULAR | Status: DC | PRN

## 2018-11-22 MED ORDER — ALBUTEROL SULFATE HFA 108 (90 BASE) MCG/ACT IN AERS
INHALATION_SPRAY | RESPIRATORY_TRACT | Status: AC
  Filled 2018-11-22: qty 6.7

## 2018-11-22 MED ORDER — HYDROMORPHONE HCL 1 MG/ML IJ SOLN
0.2500 mg | INTRAMUSCULAR | Status: DC | PRN
  Administered 2018-11-22: 16:00:00 0.5 mg via INTRAVENOUS

## 2018-11-22 MED ORDER — INSULIN ASPART 100 UNIT/ML ~~LOC~~ SOLN
SUBCUTANEOUS | Status: AC
  Filled 2018-11-22: qty 1

## 2018-11-22 MED ORDER — IOHEXOL 350 MG/ML SOLN
100.0000 mL | Freq: Once | INTRAVENOUS | Status: AC | PRN
  Administered 2018-11-22: 10:00:00 100 mL via INTRAVENOUS

## 2018-11-22 MED ORDER — ONDANSETRON HCL 4 MG/2ML IJ SOLN
INTRAMUSCULAR | Status: AC
  Filled 2018-11-22: qty 2

## 2018-11-22 MED ORDER — PROPOFOL 10 MG/ML IV BOLUS
INTRAVENOUS | Status: AC
  Filled 2018-11-22: qty 40

## 2018-11-22 MED ORDER — INSULIN ASPART 100 UNIT/ML ~~LOC~~ SOLN
5.0000 [IU] | Freq: Once | SUBCUTANEOUS | Status: AC
  Administered 2018-11-22: 5 [IU] via INTRAVENOUS

## 2018-11-22 MED ORDER — SODIUM CHLORIDE 0.9 % IV SOLN
INTRAVENOUS | Status: DC
  Administered 2018-11-22 (×2): via INTRAVENOUS

## 2018-11-22 MED ORDER — DIPHENHYDRAMINE HCL 50 MG/ML IJ SOLN
12.5000 mg | INTRAMUSCULAR | Status: DC | PRN
  Administered 2018-11-22: 16:00:00 12.5 mg via INTRAVENOUS

## 2018-11-22 MED ORDER — SODIUM CHLORIDE 0.9 % IV BOLUS
1000.0000 mL | Freq: Once | INTRAVENOUS | Status: AC
  Administered 2018-11-22: 1000 mL via INTRAVENOUS

## 2018-11-22 MED ORDER — FENTANYL CITRATE (PF) 100 MCG/2ML IJ SOLN
INTRAMUSCULAR | Status: DC | PRN
  Administered 2018-11-22 (×4): 50 ug via INTRAVENOUS

## 2018-11-22 MED ORDER — VANCOMYCIN HCL 10 G IV SOLR
1500.0000 mg | Freq: Two times a day (BID) | INTRAVENOUS | Status: DC
  Administered 2018-11-22: 23:00:00 1500 mg via INTRAVENOUS
  Filled 2018-11-22 (×3): qty 1500

## 2018-11-22 MED ORDER — ACETAMINOPHEN 10 MG/ML IV SOLN
INTRAVENOUS | Status: AC
  Administered 2018-11-22: 16:00:00 1000 mg via INTRAVENOUS
  Filled 2018-11-22: qty 100

## 2018-11-22 MED ORDER — MORPHINE SULFATE (PF) 2 MG/ML IV SOLN
1.0000 mg | INTRAVENOUS | Status: DC | PRN
  Administered 2018-11-22 – 2018-11-23 (×2): 2 mg via INTRAVENOUS
  Filled 2018-11-22 (×2): qty 1

## 2018-11-22 MED ORDER — DIPHENHYDRAMINE HCL 50 MG/ML IJ SOLN
INTRAMUSCULAR | Status: AC
  Administered 2018-11-22: 16:00:00 12.5 mg via INTRAVENOUS
  Filled 2018-11-22: qty 1

## 2018-11-22 MED ORDER — PIPERACILLIN-TAZOBACTAM 3.375 G IVPB
3.3750 g | Freq: Three times a day (TID) | INTRAVENOUS | Status: DC
  Administered 2018-11-22 – 2018-11-23 (×3): 3.375 g via INTRAVENOUS
  Filled 2018-11-22 (×3): qty 50

## 2018-11-22 MED ORDER — SUCCINYLCHOLINE CHLORIDE 200 MG/10ML IV SOSY
PREFILLED_SYRINGE | INTRAVENOUS | Status: DC | PRN
  Administered 2018-11-22: 120 mg via INTRAVENOUS

## 2018-11-22 MED ORDER — ONDANSETRON HCL 4 MG/2ML IJ SOLN
INTRAMUSCULAR | Status: DC | PRN
  Administered 2018-11-22: 4 mg via INTRAVENOUS

## 2018-11-22 MED ORDER — ENOXAPARIN SODIUM 40 MG/0.4ML ~~LOC~~ SOLN
40.0000 mg | SUBCUTANEOUS | Status: DC
  Filled 2018-11-22: qty 0.4

## 2018-11-22 MED ORDER — 0.9 % SODIUM CHLORIDE (POUR BTL) OPTIME
TOPICAL | Status: DC | PRN
  Administered 2018-11-22: 15:00:00 1000 mL

## 2018-11-22 MED ORDER — PROMETHAZINE HCL 25 MG/ML IJ SOLN: 6.2500 mg | INTRAMUSCULAR | Status: DC | PRN

## 2018-11-22 MED ORDER — INSULIN ASPART 100 UNIT/ML ~~LOC~~ SOLN
SUBCUTANEOUS | Status: AC
  Administered 2018-11-22: 17:00:00 5 [IU] via INTRAVENOUS
  Filled 2018-11-22: qty 1

## 2018-11-22 MED ORDER — LACTATED RINGERS IV SOLN: INTRAVENOUS | Status: DC

## 2018-11-22 MED ORDER — INSULIN REGULAR(HUMAN) IN NACL 100-0.9 UT/100ML-% IV SOLN
INTRAVENOUS | Status: DC
  Administered 2018-11-22: 11:00:00 5 [IU]/h via INTRAVENOUS
  Administered 2018-11-23: 07:00:00 5.2 [IU]/h via INTRAVENOUS
  Filled 2018-11-22 (×2): qty 100

## 2018-11-22 MED ORDER — MEPERIDINE HCL 50 MG/ML IJ SOLN: 6.2500 mg | INTRAMUSCULAR | Status: DC | PRN

## 2018-11-22 MED ORDER — VANCOMYCIN HCL 10 G IV SOLR
2000.0000 mg | Freq: Once | INTRAVENOUS | Status: AC
  Administered 2018-11-22: 12:00:00 2000 mg via INTRAVENOUS
  Filled 2018-11-22: qty 2000

## 2018-11-22 MED ORDER — SODIUM CHLORIDE (PF) 0.9 % IJ SOLN
INTRAMUSCULAR | Status: AC
  Filled 2018-11-22: qty 50

## 2018-11-22 MED ORDER — SUGAMMADEX SODIUM 500 MG/5ML IV SOLN
INTRAVENOUS | Status: DC | PRN
  Administered 2018-11-22: 200 mg via INTRAVENOUS

## 2018-11-22 MED ORDER — ACETAMINOPHEN 10 MG/ML IV SOLN
1000.0000 mg | Freq: Once | INTRAVENOUS | Status: DC | PRN
  Administered 2018-11-22: 16:00:00 1000 mg via INTRAVENOUS

## 2018-11-22 MED ORDER — MIDAZOLAM HCL 2 MG/2ML IJ SOLN
INTRAMUSCULAR | Status: AC
  Filled 2018-11-22: qty 2

## 2018-11-22 MED ORDER — PROPOFOL 10 MG/ML IV BOLUS
INTRAVENOUS | Status: DC | PRN
  Administered 2018-11-22: 140 mg via INTRAVENOUS

## 2018-11-22 MED ORDER — ACETAMINOPHEN 325 MG PO TABS: 325.0000 mg | ORAL_TABLET | Freq: Once | ORAL | Status: DC

## 2018-11-22 MED ORDER — SUCCINYLCHOLINE CHLORIDE 200 MG/10ML IV SOSY
PREFILLED_SYRINGE | INTRAVENOUS | Status: AC
  Filled 2018-11-22: qty 10

## 2018-11-22 MED ORDER — ROCURONIUM BROMIDE 50 MG/5ML IV SOSY
PREFILLED_SYRINGE | INTRAVENOUS | Status: DC | PRN
  Administered 2018-11-22: 40 mg via INTRAVENOUS

## 2018-11-22 MED ORDER — PIPERACILLIN-TAZOBACTAM 3.375 G IVPB 30 MIN
3.3750 g | Freq: Once | INTRAVENOUS | Status: AC
  Administered 2018-11-22: 12:00:00 3.375 g via INTRAVENOUS
  Filled 2018-11-22: qty 50

## 2018-11-22 SURGICAL SUPPLY — 38 items
ATTRACTOMAT 16X20 MAGNETIC DRP (DRAPES) IMPLANT
BAG ZIPLOCK 12X15 (MISCELLANEOUS) ×2 IMPLANT
BNDG GAUZE ELAST 4 BULKY (GAUZE/BANDAGES/DRESSINGS) ×2 IMPLANT
CATH ROBINSON RED A/P 16FR (CATHETERS) ×2 IMPLANT
CONT SPEC 4OZ CLIKSEAL STRL BL (MISCELLANEOUS) ×2 IMPLANT
COVER WAND RF STERILE (DRAPES) IMPLANT
DRAIN PENROSE 18X1/4 LTX STRL (WOUND CARE) ×2 IMPLANT
DRSG EMULSION OIL 3X3 NADH (GAUZE/BANDAGES/DRESSINGS) IMPLANT
DRSG PAD ABDOMINAL 8X10 ST (GAUZE/BANDAGES/DRESSINGS) ×2 IMPLANT
ELECT COATED BLADE 2.86 ST (ELECTRODE) ×2 IMPLANT
ELECT NEEDLE TIP 2.8 STRL (NEEDLE) ×2 IMPLANT
ELECT PENCIL ROCKER SW 15FT (MISCELLANEOUS) IMPLANT
ELECT REM PT RETURN 15FT ADLT (MISCELLANEOUS) ×2 IMPLANT
GAUZE 4X4 16PLY RFD (DISPOSABLE) IMPLANT
GAUZE SPONGE 4X4 12PLY STRL (GAUZE/BANDAGES/DRESSINGS) IMPLANT
GLOVE BIO SURGEON STRL SZ7.5 (GLOVE) ×2 IMPLANT
KIT BASIN OR (CUSTOM PROCEDURE TRAY) ×2 IMPLANT
KIT TURNOVER KIT A (KITS) IMPLANT
MARKER SKIN DUAL TIP RULER LAB (MISCELLANEOUS) ×2 IMPLANT
NEEDLE HYPO 25X1 1.5 SAFETY (NEEDLE) IMPLANT
NS IRRIG 1000ML POUR BTL (IV SOLUTION) ×2 IMPLANT
PACK EENT SPLIT (PACKS) ×2 IMPLANT
PROTECTOR NERVE ULNAR (MISCELLANEOUS) ×2 IMPLANT
SOL PREP POV-IOD 4OZ 10% (MISCELLANEOUS) ×2 IMPLANT
SUT CHROMIC 4 0 P 3 18 (SUTURE) IMPLANT
SUT ETHILON 4 0 PS 2 18 (SUTURE) IMPLANT
SUT ETHILON 5 0 P 3 18 (SUTURE)
SUT NYLON ETHILON 5-0 P-3 1X18 (SUTURE) IMPLANT
SUT SILK 3 0 (SUTURE)
SUT SILK 3 0 SH 30 (SUTURE) ×2 IMPLANT
SUT SILK 3-0 KS 30XBRD (SUTURE) IMPLANT
SWAB COLLECTION DEVICE MRSA (MISCELLANEOUS) ×2 IMPLANT
SWAB CULTURE ESWAB REG 1ML (MISCELLANEOUS) ×2 IMPLANT
SYR BULB IRRIGATION 50ML (SYRINGE) IMPLANT
SYR CONTROL 10ML LL (SYRINGE) ×2 IMPLANT
TAPE CLOTH SURG 4X10 WHT LF (GAUZE/BANDAGES/DRESSINGS) ×2 IMPLANT
WATER STERILE IRR 1000ML POUR (IV SOLUTION) ×2 IMPLANT
YANKAUER SUCT BULB TIP 10FT TU (MISCELLANEOUS) IMPLANT

## 2018-11-22 NOTE — Anesthesia Preprocedure Evaluation (Addendum)
Anesthesia Evaluation  Patient identified by MRN, date of birth, ID band Patient awake    Reviewed: Allergy & Precautions, NPO status , Patient's Chart, lab work & pertinent test results  Airway Mallampati: III  TM Distance: >3 FB Neck ROM: Full    Dental  (+) Teeth Intact, Dental Advisory Given   Pulmonary neg pulmonary ROS,    breath sounds clear to auscultation       Cardiovascular negative cardio ROS   Rhythm:Regular Rate:Normal     Neuro/Psych negative neurological ROS     GI/Hepatic negative GI ROS, Neg liver ROS,   Endo/Other  diabetes, Type 1, Insulin Dependent  Renal/GU negative Renal ROS     Musculoskeletal negative musculoskeletal ROS (+)   Abdominal Normal abdominal exam  (+)   Peds  Hematology negative hematology ROS (+)   Anesthesia Other Findings   Reproductive/Obstetrics                            Lab Results  Component Value Date   CREATININE 1.39 (H) 11/22/2018   BUN 20 11/22/2018   NA 133 (L) 11/22/2018   K 4.6 11/22/2018   CL 93 (L) 11/22/2018   CO2 9 (L) 11/22/2018   Lab Results  Component Value Date   INR 1.2 11/22/2018     Anesthesia Physical Anesthesia Plan  ASA: III and emergent  Anesthesia Plan: General   Post-op Pain Management:    Induction: Intravenous, Rapid sequence and Cricoid pressure planned  PONV Risk Score and Plan: 3 and Ondansetron, Midazolam, Treatment may vary due to age or medical condition and Scopolamine patch - Pre-op  Airway Management Planned: Oral ETT and Video Laryngoscope Planned  Additional Equipment: None  Intra-op Plan:   Post-operative Plan: Extubation in OR  Informed Consent:   Plan Discussed with: CRNA  Anesthesia Plan Comments:         Anesthesia Quick Evaluation

## 2018-11-22 NOTE — Op Note (Signed)
NAMEBRIGHAM, COBBINS MEDICAL RECORD KG:[id number] ACCOUNT [id number] DATE OF BIRTH:01-20-1971 FACILITY: [facility] LOCATION: [facility] PHYSICIAN:Shereka Lafortune Guido Sander, MD  OPERATIVE REPORT  DATE OF PROCEDURE:  11/22/2018  PREOPERATIVE DIAGNOSIS:  Neck abscess.  POSTOPERATIVE DIAGNOSIS:  Neck abscess.  PROCEDURE:  Incision and drainage of deep space neck abscess.  SURGEON:  Melida Quitter, MD  ANESTHESIA:  General endotracheal anesthesia.  COMPLICATIONS:  None.  INDICATIONS:  The patient is a 48 year old male with diabetes who has a 5 day history of increasing pain and swelling in the lower neck, more to the left.  He came to the Emergency Department where CT imaging demonstrated a poorly defined area of  swelling, possibly with a couple of bubbles in it.  He is found to be in DKA and with an elevated white blood count.  He is brought to the operating room for surgical management.  FINDINGS:  Upon incising into the area of concern, there was a spongy quality to the tissue with copious purulent drainage expressed from multiple areas.  The region was explored down to the thyroid gland as well as toward the great vessels and posterior  to the sternoclavicular joint to the thoracic inlet.  These were areas that all had purulent fluid expressed from tissues.  Some of the tissue centered in this area had a necrotic appearance.  Once all areas that were able to express pus were opened,  the wound was packed with saline gauze.  A culture was sent.  DESCRIPTION OF PROCEDURE:  The patient was identified in the holding room, informed consent having been obtained including discussion of risks, benefits and alternatives, the patient was brought to the operative suite and put the operative table in  supine position.  Anesthesia was induced and the patient was intubated by the anesthesia team without difficulty.  The patient was receiving intravenous antibiotics already.  The eyes were taped closed and a shoulder  roll was placed.  The neck was  prepped and draped in sterile fashion.  Incision was marked with a marking pen and injected with 1% lidocaine with 1:100,000 epinephrine.  The incision was to the left of midline in the lower neck.  Incision was made with a 15 blade scalpel and extended  through the subcutaneous fat using Bovie electrocautery.  Hemostat was then used to bluntly probe into the area of infection and the findings are noted above.  Dissection continued with blunt dissection primarily, opening various pathways of infection in  a radial direction from the incision within the fat and muscle and then also deeply toward the thyroid gland as well as toward the great vessels.  There was some necrotic tissue encountered and what would be in the center of the infected area.  The  incision was opened more laterally using a 15 blade scalpel and Bovie electrocautery.  The external jugular vein required ligation and division.  This allowed better exposure and continued finger dissection was performed to continue to open up infected  areas.  This included going behind the sternoclavicular joint to the thoracic inlet where some pus was also encountered.  After this was performed and the tissue was adequately opened, the area was examined.  The amount of necrotic tissue was pretty  minimal.  At this point, the wound was then packed with saline soaked Kerlix including down into the thoracic inlet as well as into the areas that were opened.  After this was completed, the extra was cut off.  The drapes were removed and the patient  was  cleaned off.  An ABD pad was taped over the wound.    He was then returned to Anesthesia for wakeup and was extubated in the recovery room in stable condition.  AN/NUANCE  D:11/22/2018 T:11/22/2018 JOB:[id number]

## 2018-11-22 NOTE — Transfer of Care (Signed)
Immediate Anesthesia Transfer of Care Note  Patient: Erion Weightman  Procedure(s) Performed: INCISION AND DRAINAGE OF NECK ABSCESS (N/A Neck)  Patient Location: PACU  Anesthesia Type:General  Level of Consciousness: awake, alert , oriented and patient cooperative  Airway & Oxygen Therapy: patient spontaneously breathing with face mask O2  Post-op Assessment: Report given to RN and Post -op Vital signs reviewed and stable  Post vital signs: Reviewed and stable  Last Vitals:  Vitals Value Taken Time  BP    Temp    Pulse    Resp    SpO2      Last Pain:  Vitals:   11/22/18 0849  PainSc: 7       Patients Stated Pain Goal: 2 (29/79/89 2119)  Complications: No apparent anesthesia complications

## 2018-11-22 NOTE — Anesthesia Postprocedure Evaluation (Signed)
Anesthesia Post Note  Patient: Kinston Magnan  Procedure(s) Performed: INCISION AND DRAINAGE OF NECK ABSCESS (N/A Neck)     Patient location during evaluation: PACU Anesthesia Type: General Level of consciousness: awake and alert Pain management: pain level controlled Vital Signs Assessment: post-procedure vital signs reviewed and stable Respiratory status: spontaneous breathing, nonlabored ventilation, respiratory function stable and patient connected to nasal cannula oxygen Cardiovascular status: blood pressure returned to baseline and stable Postop Assessment: no apparent nausea or vomiting Anesthetic complications: no    Last Vitals:  Vitals:   11/22/18 1645 11/22/18 1700  BP: 110/70 129/62  Pulse: (!) 118 (!) 114  Resp: (!) 23 19  Temp: (!) 38.3 C   SpO2: 100% 100%    Last Pain:  Vitals:   11/22/18 0849  PainSc: 7                  Effie Berkshire

## 2018-11-22 NOTE — H&P (Addendum)
History and Physical    Taylor Hughes VVO:[id number] DOB: [birth year] DOA: 11/22/2018  PCP: Jilda Panda, MD  Patient coming from: Home.   I have personally briefly reviewed patient's old medical records in [place]  Chief Complaint: pain in the neck and substernal chest pain.   HPI: Taylor Hughes is a 48 y.o. male with medical history significant of DM, left diabetic foot, presents with severe pain int he neck, left shoulder and substernal chest pain.  Patient reports that he was moving the lawn and pulled the starter on Saturday , since then his left shoulder , left sided chest pain started progressed to the neck and substernal area, associated with fullness in the neck and sternal area. He also reports not taking his insulin since three days. He denies any fevers, but reports having chills. No nausea or vomiting or abdominal pain. He denies any sob or cough, dysuria, reports having headache, . No dizziness or lightheadedness. He also reports worsening leg edema.   ED Course: on arrival to ED, he was found to be afebrile, tachycardic, tachypneic, hypertensive,. Labs reveal hyponatremia, hyperkalemia, elevated glucose of 699, AG OF 23. ALK phos of 138. Wbc count of 26.6, hemoglobin of 12.1.   CT of the soft tissues of the neck show Phlegmonous inflammatory change at the thoracic inlet anteriorly, more to the left, measuring about 6 cm in diameter with a few tiny air bubbles. Asymmetric enlargement of the left pectoralis muscle suggesting myositis.  CT angio Suboptimal evaluation of peripheral pulmonary arteries due to significant respiratory motion artifact. No central pulmonary emboli identified.   Review of Systems: As per HPI otherwise 10 point review of systems negative.   Past Medical History:  Diagnosis Date   Diabetes mellitus without complication ([place])    Wears glasses    Wound, open, foot    left diabetic     Past Surgical History:  Procedure Laterality Date    AMPUTATION Left 08/20/2018   Procedure: LEFT FOOT IRRIGATON AND DEBRIDEMENT, 5TH RAY AMPUTATION;  Surgeon: Newt Minion, MD;  Location: [place];  Service: Orthopedics;  Laterality: Left;   I&D EXTREMITY Left 07/30/2018   Procedure: IRRIGATION AND DEBRIDEMENT LEFT FOOT WITH POSSIBLE AMPUTATION OF FIFTH TOE;  Surgeon: Mcarthur Rossetti, MD;  Location: [facility];  Service: Orthopedics;  Laterality: Left;     reports that he has never smoked. He has never used smokeless tobacco. He reports that he does not drink alcohol or use drugs.  No Known Allergies  Family History  Problem Relation Age of Onset   Diabetes Mother    Family history reviewed and pertinent  Prior to Admission medications   Medication Sig Start Date End Date Taking? Authorizing Provider  insulin aspart (NOVOLOG) 100 UNIT/ML injection Inject 4 Units into the skin 3 (three) times daily with meals. 08/05/18  Yes Dana Allan I, MD  insulin glargine (LANTUS) 100 UNIT/ML injection Inject 0.12 mLs (12 Units total) into the skin 2 (two) times daily. Patient taking differently: Inject 12 Units into the skin daily.  08/05/18  Yes Dana Allan I, MD  metoprolol tartrate (LOPRESSOR) 25 MG tablet Take 1 tablet (25 mg total) by mouth 2 (two) times daily. Patient not taking: Reported on 11/22/2018 08/05/18 08/05/19  Bonnell Public, MD    Physical Exam: Vitals:   11/22/18 0908 11/22/18 0930 11/22/18 1000 11/22/18 1201  BP: (!) 176/98 (!) 163/87 (!) 180/88 (!) 152/99  Pulse: (!) 117 (!) 116 (!) 117 (!) 115  Resp: (!) 31 (!) 24 (!) 25 (!) 25  Temp:      SpO2: 100% 100% 100% 100%  Weight:      Height:        Constitutional: in moderate distress from pain in the neck area.  Vitals:   11/22/18 0908 11/22/18 0930 11/22/18 1000 11/22/18 1201  BP: (!) 176/98 (!) 163/87 (!) 180/88 (!) 152/99  Pulse: (!) 117 (!) 116 (!) 117 (!) 115  Resp: (!) 31 (!) 24 (!) 25 (!) 25  Temp:      SpO2: 100% 100% 100% 100%  Weight:       Height:       Eyes: PERRL, lids and conjunctivae normal ENMT: Mucous membranes are dry.  Neck: full ness in the lower  neck area in the sternal notch, with redness and tenderness.  Respiratory:  Tachypnea, good air entry bilateral. No wheezing or rhonchi.  Cardiovascular: Regular rate and rhythm, no murmurs / rubs / gallops. No extremity edema. 2+ pedal pulses. No carotid bruits.  Abdomen: no tenderness, no masses palpated. No hepatosplenomegaly. Bowel sounds positive.  Musculoskeletal: no clubbing / cyanosis. No joint deformity upper and lower extremities. Good ROM, no contractures. Normal muscle tone.  Skin: no rashes, lesions, ulcers. No induration Neurologic: CN 2-12 grossly intact. Sensation intact, DTR normal. Strength 5/5 in all 4.  Psychiatric: Normal judgment and insight. Alert and oriented x 3. Normal mood.     Labs on Admission: I have personally reviewed following labs and imaging studies  CBC: Recent Labs  Lab 11/22/18 0909  WBC 26.6*  NEUTROABS 23.6*  HGB 12.1*  HCT 39.1  MCV 93.8  PLT 703   Basic Metabolic Panel: Recent Labs  Lab 11/22/18 0909  NA 125*  K 5.5*  CL 93*  CO2 9*  GLUCOSE 689*  BUN 20  CREATININE 1.39*  CALCIUM 9.1   GFR: Estimated Creatinine Clearance: 80.3 mL/min (A) (by C-G formula based on SCr of 1.39 mg/dL (H)). Liver Function Tests: Recent Labs  Lab 11/22/18 0909  AST 17  ALT 13  ALKPHOS 138*  BILITOT 1.7*  PROT 8.9*  ALBUMIN 3.2*   No results for input(s): LIPASE, AMYLASE in the last 168 hours. No results for input(s): AMMONIA in the last 168 hours. Coagulation Profile: Recent Labs  Lab 11/22/18 0909  INR 1.2   Cardiac Enzymes: Recent Labs  Lab 11/22/18 0909  TROPONINI <0.03   BNP (last 3 results) No results for input(s): PROBNP in the last 8760 hours. HbA1C: No results for input(s): HGBA1C in the last 72 hours. CBG: Recent Labs  Lab 11/22/18 1046 11/22/18 1214  GLUCAP 556* 462*   Lipid Profile: No  results for input(s): CHOL, HDL, LDLCALC, TRIG, CHOLHDL, LDLDIRECT in the last 72 hours. Thyroid Function Tests: No results for input(s): TSH, T4TOTAL, FREET4, T3FREE, THYROIDAB in the last 72 hours. Anemia Panel: No results for input(s): VITAMINB12, FOLATE, FERRITIN, TIBC, IRON, RETICCTPCT in the last 72 hours. Urine analysis:    Component Value Date/Time   COLORURINE STRAW (A) 11/22/2018 1049   APPEARANCEUR CLEAR 11/22/2018 1049   LABSPEC 1.023 11/22/2018 1049   PHURINE 5.0 11/22/2018 1049   GLUCOSEU >=500 (A) 11/22/2018 1049   HGBUR MODERATE (A) 11/22/2018 1049   BILIRUBINUR NEGATIVE 11/22/2018 1049   KETONESUR 80 (A) 11/22/2018 1049   PROTEINUR NEGATIVE 11/22/2018 1049   NITRITE NEGATIVE 11/22/2018 1049   LEUKOCYTESUR NEGATIVE 11/22/2018 1049    Radiological Exams on Admission: Ct Soft Tissue Neck W Contrast  Result Date: 11/22/2018 CLINICAL DATA:  Sudden onset of swelling in the suprasternal region EXAM: CT NECK WITH CONTRAST TECHNIQUE: Multidetector CT imaging of the neck was performed using the standard protocol following the bolus administration of intravenous contrast. CONTRAST:  173m OMNIPAQUE IOHEXOL 350 MG/ML SOLN COMPARISON:  None. FINDINGS: Pharynx and larynx: No mucosal or submucosal lesion is seen. Salivary glands: Parotid and submandibular glands are normal. Thyroid: Normal Lymph nodes: No enlarged or low-density nodes seen in the neck itself. Normal bilateral nodes. Vascular: Normal Limited intracranial: Normal Visualized orbits: Normal Mastoids and visualized paranasal sinuses: Clear Skeleton: No acute skeletal abnormality is seen. Upper chest: There appears to be asymmetric swelling of the left pectoralis musculature. There is extensive soft tissue abnormality at the thoracic inlet anteriorly extending more towards the left. The area of concern measures about 6 cm in diameter and contains a few tiny air bubbles. This is most consistent with phlegmonous inflammatory  change. Most commonly, this might emanate from a septic sternoclavicular joint, but there is no distinct evidence of that on the CT. Other: None IMPRESSION: Phlegmonous inflammatory change at the thoracic inlet anteriorly, more to the left, measuring about 6 cm in diameter with a few tiny air bubbles. Asymmetric enlargement of the left pectoralis muscle suggesting myositis. Often, infection in this region will arise from the sternoclavicular joint, but I cannot establish that in this case. Electronically Signed   By: MNelson ChimesM.D.   On: 11/22/2018 11:00   Ct Angio Chest Pe W And/or Wo Contrast  Result Date: 11/22/2018 CLINICAL DATA:  Acute onset chest pain 2 days ago after trying to start lawn mower. Chest pain, shortness of breath, and tachycardia. EXAM: CT ANGIOGRAPHY CHEST WITH CONTRAST TECHNIQUE: Multidetector CT imaging of the chest was performed using the standard protocol during bolus administration of intravenous contrast. Multiplanar CT image reconstructions and MIPs were obtained to evaluate the vascular anatomy. CONTRAST:  1046mOMNIPAQUE IOHEXOL 350 MG/ML SOLN COMPARISON:  None. FINDINGS: Cardiovascular: Satisfactory opacification of pulmonary arteries noted, however evaluation of peripheral pulmonary arteries is limited by significant respiratory motion artifact. No central pulmonary emboli identified. No evidence of thoracic aortic dissection or aneurysm. Mediastinum/Nodes: No masses or pathologically enlarged lymph nodes identified within the mediastinum. Abnormal soft tissue density is seen in the anterior lower neck just above the sternal notch. Lungs/Pleura: No pulmonary mass, infiltrate, or effusion. Mild right lower lobe scarring noted. Upper abdomen: No acute findings. Musculoskeletal: No suspicious bone lesions identified. Multiple old right rib fracture deformities noted. Review of the MIP images confirms the above findings. IMPRESSION: 1. Suboptimal evaluation of peripheral pulmonary  arteries due to significant respiratory motion artifact. No central pulmonary emboli identified. 2. No evidence of thoracic aortic dissection or aneurysm. 3. Abnormal soft tissue density in anterior lower neck above the sternal notch. See separate report for neck CT also performed today. Electronically Signed   By: JoEarle Gell.D.   On: 11/22/2018 11:29   Vas [place] Extremity Venous (dvt) (only [facility])  Result Date: 11/22/2018  Lower Venous Study Indications: Edema, and SOB.  Limitations: Body habitus and interstitial edema. Comparison Study: Prior negative study for comparison from 07/29/18 Performing Technologist: CaSharion DoveVS  Examination Guidelines: A complete evaluation includes B-mode imaging, spectral Doppler, color Doppler, and power Doppler as needed of all accessible portions of each vessel. Bilateral testing is considered an integral part of a complete examination. Limited examinations for reoccurring indications may be performed as noted.  Right Venous Findings: +---+---------------+---------+-----------+----------+-------+  Compressibility Phasicity Spontaneity Properties Summary  +---+---------------+---------+-----------+----------+-------+  CFV Full            Yes       Yes                             +---+---------------+---------+-----------+----------+-------+  Left Venous Findings: +---------+---------------+---------+-----------+----------+--------------+            Compressibility Phasicity Spontaneity Properties Summary         +---------+---------------+---------+-----------+----------+--------------+  CFV       Full            Yes       Yes                                    +---------+---------------+---------+-----------+----------+--------------+  SFJ       Full                                                             +---------+---------------+---------+-----------+----------+--------------+  FV Prox   Full                                                              +---------+---------------+---------+-----------+----------+--------------+  FV Mid    Full                                                             +---------+---------------+---------+-----------+----------+--------------+  FV Distal Full                                                             +---------+---------------+---------+-----------+----------+--------------+  PFV       Full                                                             +---------+---------------+---------+-----------+----------+--------------+  POP       Full            Yes       Yes                                    +---------+---------------+---------+-----------+----------+--------------+  PTV       Full                                                             +---------+---------------+---------+-----------+----------+--------------+  PERO                                                       Not visualized  +---------+---------------+---------+-----------+----------+--------------+    Summary: Right: No evidence of common femoral vein obstruction. Left: There is no evidence of deep vein thrombosis in the lower extremity. However, portions of this examination were limited- see technologist comments above.  *See table(s) above for measurements and observations.    Preliminary     EKG: Independently reviewed. Sinus tachycardia.   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  EDP discussed the CT results with both CT surgery ( deferred to ENT).  ENT( Dr Redmond Baseman)  recommended to continue with IV antibiotics and if the swelling worsens or new stridor or dyspnea, to rescan the area and call them.    DKA: Admit for IV insulin gtt.  Get BMP every 4 hours till AG closes and bicarb improves to greater  than 18.  Clear liquid diet.    Tachycardia probably from the DKA and pain.  EKG shows sinus tachycardia.  No h/o CAD.    Hypertension:  Sub optimal.  Prn hydralazine ordered.    AKI; Probably from dehydration from DKA and infection.  Hydrate and repeat renal parameters in am.     Hyponatremia, hyperkalemia:  From DKA. Hydrate and repeat.    Mild anemia of chronic disease: Hemoglobin around 12. Continue to monitor.    Leukocytosis:  Possibly from the myositis and DKA.  Repeat CBC in am.    DVT prophylaxis: lovenox.  Code Status: full code.  Family Communication: none at bedside.  Disposition Plan: pending clinical improvement.  Consults called: ENT with Dr Redmond Baseman.  Admission status: inpatient/ SDU   Hosie Poisson MD [facility] Pager [phone]  If 7PM-7AM, please contact night-coverage [email or web address] Password [facility]  11/22/2018, 12:22 PM     Addendum:  Discussed with Dr Redmond Baseman, who took the patient to OR , and did I&D of the deep  space neck abscess. The specimen sent to culture.  Continue to monitor the patient in step down.   Jeoffrey Massed Anwita Mencer,MD [phone]

## 2018-11-22 NOTE — Consult Note (Signed)
Reason for Consult: Neck swelling Referring Physician: ER  Taylor Hughes is an 48 y.o. male.  HPI: 48 year old male with five days of worsening lower neck pain and swelling that started after pulling on his lawn-mower's starter.  He came to the ER today due to significant worsening of symptoms.  Pain involves the anterior neck, left shoulder, and left chest.  He has not had fever but has had chills.  He was found in the ER to be in DKA and is being admitted.  Past Medical History:  Diagnosis Date  . Diabetes mellitus without complication ([place])   . Wears glasses   . Wound, open, foot    left diabetic     Past Surgical History:  Procedure Laterality Date  . AMPUTATION Left 08/20/2018   Procedure: LEFT FOOT IRRIGATON AND DEBRIDEMENT, 5TH RAY AMPUTATION;  Surgeon: Newt Minion, MD;  Location: [place] [facility];  Service: Orthopedics;  Laterality: Left;  . I&D EXTREMITY Left 07/30/2018   Procedure: IRRIGATION AND DEBRIDEMENT LEFT FOOT WITH POSSIBLE AMPUTATION OF FIFTH TOE;  Surgeon: Mcarthur Rossetti, MD;  Location: [facility];  Service: Orthopedics;  Laterality: Left;    Family History  Problem Relation Age of Onset  . Diabetes Mother     Social History:  reports that he has never smoked. He has never used smokeless tobacco. He reports that he does not drink alcohol or use drugs.  Allergies: No Known Allergies  Medications: I have reviewed the patient's current medications.  Results for orders placed or performed during the hospital encounter of 11/22/18 (from the past 48 hour(s))  CBC with Differential     Status: Abnormal   Collection Time: 11/22/18  9:09 AM  Result Value Ref Range   WBC 26.6 (H) 4.0 - 10.5 K/uL    Comment: WHITE COUNT CONFIRMED ON SMEAR   RBC 4.17 (L) 4.22 - 5.81 MIL/uL   Hemoglobin 12.1 (L) 13.0 - 17.0 g/dL   HCT 39.1 39.0 - 52.0 %   MCV 93.8 80.0 - 100.0 fL   MCH 29.0 26.0 - 34.0 pg   MCHC 30.9 30.0 - 36.0 g/dL   RDW 16.8 (H) 11.5 - 15.5 %   Platelets 300 150 -  400 K/uL   nRBC 0.0 0.0 - 0.2 %   Neutrophils Relative % 89 %   Neutro Abs 23.6 (H) 1.7 - 7.7 K/uL   Lymphocytes Relative 3 %   Lymphs Abs 0.7 0.7 - 4.0 K/uL   Monocytes Relative 7 %   Monocytes Absolute 1.8 (H) 0.1 - 1.0 K/uL   Eosinophils Relative 0 %   Eosinophils Absolute 0.1 0.0 - 0.5 K/uL   Basophils Relative 0 %   Basophils Absolute 0.1 0.0 - 0.1 K/uL   WBC Morphology MILD LEFT SHIFT (1-5% METAS, OCC MYELO, OCC BANDS)    Immature Granulocytes 1 %   Abs Immature Granulocytes 0.35 (H) 0.00 - 0.07 K/uL    Comment: Performed at [facility], [place] [address], [place], [place] [address]  Comprehensive metabolic panel     Status: Abnormal   Collection Time: 11/22/18  9:09 AM  Result Value Ref Range   Sodium 125 (L) 135 - 145 mmol/L   Potassium 5.5 (H) 3.5 - 5.1 mmol/L   Chloride 93 (L) 98 - 111 mmol/L   CO2 9 (L) 22 - 32 mmol/L   Glucose, Bld 689 (HH) 70 - 99 mg/dL    Comment: CRITICAL RESULT CALLED TO, READ BACK BY AND VERIFIED  WITH: D.GORE RN AT [phone] ON 11/22/2018 BY S.VANHOORNE    BUN 20 6 - 20 mg/dL   Creatinine, Ser 1.39 (H) 0.61 - 1.24 mg/dL   Calcium 9.1 8.9 - 10.3 mg/dL   Total Protein 8.9 (H) 6.5 - 8.1 g/dL   Albumin 3.2 (L) 3.5 - 5.0 g/dL   AST 17 15 - 41 U/L   ALT 13 0 - 44 U/L   Alkaline Phosphatase 138 (H) 38 - 126 U/L   Total Bilirubin 1.7 (H) 0.3 - 1.2 mg/dL   GFR calc non Af Amer >60 >60 mL/min   GFR calc Af Amer >60 >60 mL/min   Anion gap 23 (H) 5 - 15    Comment: Performed at [facility], [place] [address], [place], [place] [address]  Troponin I - STAT Now Then Q3H     Status: None   Collection Time: 11/22/18  9:09 AM  Result Value Ref Range   Troponin I <0.03 <0.03 ng/mL    Comment: Performed at [facility], [place] [address] [place], [place] [address]  Lactic acid, plasma     Status: None   Collection Time: 11/22/18  9:09 AM  Result Value Ref Range   Lactic Acid, Venous 1.8 0.5 - 1.9 mmol/L     Comment: Performed at [facility], [place] [address] [place], [place] [address]  Lactic acid, plasma     Status: None   Collection Time: 11/22/18  9:09 AM  Result Value Ref Range   Lactic Acid, Venous 1.5 0.5 - 1.9 mmol/L    Comment: Performed at [facility], [place] [address], [place], [place] [address]  Protime-INR     Status: Abnormal   Collection Time: 11/22/18  9:09 AM  Result Value Ref Range   Prothrombin Time 15.3 (H) 11.4 - 15.2 seconds   INR 1.2 0.8 - 1.2    Comment: (NOTE) INR goal varies based on device and disease states. Performed at [facility], [place] [address], [place], [place] [address]   CBG monitoring, ED     Status: Abnormal   Collection Time: 11/22/18 10:46 AM  Result Value Ref Range   Glucose-Capillary 556 (HH) 70 - 99 mg/dL  Urinalysis, Routine w reflex microscopic     Status: Abnormal   Collection Time: 11/22/18 10:49 AM  Result Value Ref Range   Color, Urine STRAW (A) YELLOW   APPearance CLEAR CLEAR   Specific Gravity, Urine 1.023 1.005 - 1.030   pH 5.0 5.0 - 8.0   Glucose, UA >=500 (A) NEGATIVE mg/dL   Hgb urine dipstick MODERATE (A) NEGATIVE   Bilirubin Urine NEGATIVE NEGATIVE   Ketones, ur 80 (A) NEGATIVE mg/dL   Protein, ur NEGATIVE NEGATIVE mg/dL   Nitrite NEGATIVE NEGATIVE   Leukocytes,Ua NEGATIVE NEGATIVE   RBC / HPF 0-5 0 - 5 RBC/hpf   WBC, UA 0-5 0 - 5 WBC/hpf   Bacteria, UA RARE (A) NONE SEEN   Squamous Epithelial / LPF 0-5 0 - 5   Mucus PRESENT     Comment: Performed at [facility], [place] [address], [place], [place] [address]  Blood gas, venous (at [facility])     Status: Abnormal   Collection Time: 11/22/18 10:49 AM  Result Value Ref Range   pH, Ven 7.186 (LL) 7.250 - 7.430    Comment: CRITICAL RESULT CALLED TO, READ BACK BY AND VERIFIED WITH: C.TEGELER, MD AT 1052 BY M.JESTER, RRT, RCP  ON 11/22/2018    pCO2, Ven 25.7 (L) 44.0 - 60.0 mmHg   pO2, Ven 46.4 (H) 32.0  - 45.0 mmHg   Bicarbonate 9.3 (L) 20.0 - 28.0 mmol/L   Acid-base deficit 17.6 (H) 0.0 - 2.0 mmol/L   O2 Saturation 71.5 %   Patient temperature 98.6    Collection site VENOUS    Drawn by DRAWN BY RN    Sample type VENOUS     Comment: Performed at [facility], [place] [address] [place], [place] [address]  CBG monitoring, ED     Status: Abnormal   Collection Time: 11/22/18 12:14 PM  Result Value Ref Range   Glucose-Capillary 462 (H) 70 - 99 mg/dL  CBG monitoring, ED     Status: Abnormal   Collection Time: 11/22/18  1:18 PM  Result Value Ref Range   Glucose-Capillary 418 (H) 70 - 99 mg/dL    Ct Soft Tissue Neck W Contrast  Result Date: 11/22/2018 CLINICAL DATA:  Sudden onset of swelling in the suprasternal region EXAM: CT NECK WITH CONTRAST TECHNIQUE: Multidetector CT imaging of the neck was performed using the standard protocol following the bolus administration of intravenous contrast. CONTRAST:  112mL OMNIPAQUE IOHEXOL 350 MG/ML SOLN COMPARISON:  None. FINDINGS: Pharynx and larynx: No mucosal or submucosal lesion is seen. Salivary glands: Parotid and submandibular glands are normal. Thyroid: Normal Lymph nodes: No enlarged or low-density nodes seen in the neck itself. Normal bilateral nodes. Vascular: Normal Limited intracranial: Normal Visualized orbits: Normal Mastoids and visualized paranasal sinuses: Clear Skeleton: No acute skeletal abnormality is seen. Upper chest: There appears to be asymmetric swelling of the left pectoralis musculature. There is extensive soft tissue abnormality at the thoracic inlet anteriorly extending more towards the left. The area of concern measures about 6 cm in diameter and contains a few tiny air bubbles. This is most consistent with phlegmonous inflammatory change. Most commonly, this might emanate from a septic sternoclavicular joint, but there is no distinct evidence of that on the CT. Other: None IMPRESSION: Phlegmonous inflammatory change  at the thoracic inlet anteriorly, more to the left, measuring about 6 cm in diameter with a few tiny air bubbles. Asymmetric enlargement of the left pectoralis muscle suggesting myositis. Often, infection in this region will arise from the sternoclavicular joint, but I cannot establish that in this case. Electronically Signed   By: Nelson Chimes M.D.   On: 11/22/2018 11:00   Ct Angio Chest Pe W And/or Wo Contrast  Result Date: 11/22/2018 CLINICAL DATA:  Acute onset chest pain 2 days ago after trying to start lawn mower. Chest pain, shortness of breath, and tachycardia. EXAM: CT ANGIOGRAPHY CHEST WITH CONTRAST TECHNIQUE: Multidetector CT imaging of the chest was performed using the standard protocol during bolus administration of intravenous contrast. Multiplanar CT image reconstructions and MIPs were obtained to evaluate the vascular anatomy. CONTRAST:  174mL OMNIPAQUE IOHEXOL 350 MG/ML SOLN COMPARISON:  None. FINDINGS: Cardiovascular: Satisfactory opacification of pulmonary arteries noted, however evaluation of peripheral pulmonary arteries is limited by significant respiratory motion artifact. No central pulmonary emboli identified. No evidence of thoracic aortic dissection or aneurysm. Mediastinum/Nodes: No masses or pathologically enlarged lymph nodes identified within the mediastinum. Abnormal soft tissue density is seen in the anterior lower neck just above the sternal notch. Lungs/Pleura: No pulmonary mass, infiltrate, or effusion. Mild right lower lobe scarring noted. Upper abdomen: No acute findings. Musculoskeletal: No suspicious bone lesions identified. Multiple old right rib fracture deformities noted. Review of the MIP  images confirms the above findings. IMPRESSION: 1. Suboptimal evaluation of peripheral pulmonary arteries due to significant respiratory motion artifact. No central pulmonary emboli identified. 2. No evidence of thoracic aortic dissection or aneurysm. 3. Abnormal soft tissue density in  anterior lower neck above the sternal notch. See separate report for neck CT also performed today. Electronically Signed   By: Earle Gell M.D.   On: 11/22/2018 11:29   Vas [place] Lower Extremity Venous (dvt) (only [facility])  Result Date: 11/22/2018  Lower Venous Study Indications: Edema, and SOB.  Limitations: Body habitus and interstitial edema. Comparison Study: Prior negative study for comparison from 07/29/18 Performing Technologist: Sharion Dove RVS  Examination Guidelines: A complete evaluation includes B-mode imaging, spectral Doppler, color Doppler, and power Doppler as needed of all accessible portions of each vessel. Bilateral testing is considered an integral part of a complete examination. Limited examinations for reoccurring indications may be performed as noted.  Right Venous Findings: +---+---------------+---------+-----------+----------+-------+    CompressibilityPhasicitySpontaneityPropertiesSummary +---+---------------+---------+-----------+----------+-------+ CFVFull           Yes      Yes                          +---+---------------+---------+-----------+----------+-------+  Left Venous Findings: +---------+---------------+---------+-----------+----------+--------------+          CompressibilityPhasicitySpontaneityPropertiesSummary        +---------+---------------+---------+-----------+----------+--------------+ CFV      Full           Yes      Yes                                 +---------+---------------+---------+-----------+----------+--------------+ SFJ      Full                                                        +---------+---------------+---------+-----------+----------+--------------+ FV Prox  Full                                                        +---------+---------------+---------+-----------+----------+--------------+ FV Mid   Full                                                         +---------+---------------+---------+-----------+----------+--------------+ FV DistalFull                                                        +---------+---------------+---------+-----------+----------+--------------+ PFV      Full                                                        +---------+---------------+---------+-----------+----------+--------------+  POP      Full           Yes      Yes                                 +---------+---------------+---------+-----------+----------+--------------+ PTV      Full                                                        +---------+---------------+---------+-----------+----------+--------------+ PERO                                                  Not visualized +---------+---------------+---------+-----------+----------+--------------+    Summary: Right: No evidence of common femoral vein obstruction. Left: There is no evidence of deep vein thrombosis in the lower extremity. However, portions of this examination were limited- see technologist comments above.  *See table(s) above for measurements and observations.    Preliminary     Review of Systems  Constitutional: Positive for chills.  Gastrointestinal: Positive for heartburn.  Musculoskeletal: Positive for joint pain.  All other systems reviewed and are negative.  Blood pressure (!) 164/94, pulse (!) 123, temperature 98.2 F (36.8 C), resp. rate (!) 26, height 5\' 11"  (1.803 m), weight 100.7 kg, SpO2 100 %. Physical Exam  Constitutional: He is oriented to person, place, and time. He appears well-developed and well-nourished. No distress.  Shivering.  HENT:  Head: Normocephalic and atraumatic.  Right Ear: External ear normal.  Left Ear: External ear normal.  Nose: Nose normal.  Mouth/Throat: Oropharynx is clear and moist.  Eyes: Pupils are equal, round, and reactive to light. Conjunctivae and EOM are normal.  Neck:  Anterior lower neck with prominent  swelling, redness, and tenderness centered just left of midline over thyroid area.  Cardiovascular: Normal rate.  Respiratory: Effort normal.  Neurological: He is alert and oriented to person, place, and time. No cranial nerve deficit.  Skin: Skin is warm and dry.  Psychiatric: He has a normal mood and affect. His behavior is normal. Judgment and thought content normal.    Assessment/Plan: Lower neck abscess, DKA  I personally reviewed his neck CT demonstrating a poorly defined fullness in the lower anterior neck with possible small air bubble in the center.  On exam, the area is prominent and concerning for an abscess.  Being that he is so sick, I recommended proceeding with incision and drainage.  Risks, benefits, and alternatives were discussed and he expressed understanding and agreement.  He will be admitted to the hospitalist service post-op for IV antibiotics and management of DKA.  Melida Quitter 11/22/2018, 1:34 PM

## 2018-11-22 NOTE — Progress Notes (Signed)
Pharmacy Antibiotic Note  Taylor Hughes is a 48 y.o. male admitted on 11/22/2018 with possible myositis of the left pectoralis muscle and possible septic sternoclavicular joint.  Pharmacy has been consulted for vancomycin and zosyn dosing.  Afebrile WBC 26.6 SCr 1.39 (0.97 on 08/20/18) Lactate 1.8 > 1.5  Plan: Vancomycin 2g IV x 1 in ED, then 1500mg  IV q12h - estimated AUC 499 using SCr 1.39 Check vancomycin levels as needed, goal AUC 400-550 Zosyn 3.375gm IV q8h (4hr extended infusions) Follow up renal function & cultures  Height: 5\' 11"  (180.3 cm) Weight: 222 lb (100.7 kg) IBW/kg (Calculated) : 75.3  Temp (24hrs), Avg:98.2 F (36.8 C), Min:98.2 F (36.8 C), Max:98.2 F (36.8 C)  Recent Labs  Lab 11/22/18 0909  WBC 26.6*  CREATININE 1.39*  LATICACIDVEN 1.5  1.8    Estimated Creatinine Clearance: 80.3 mL/min (A) (by C-G formula based on SCr of 1.39 mg/dL (H)).    No Known Allergies  Antimicrobials this admission:  4/13 Vanc >> 4/13 Zosyn >>  Dose adjustments this admission:   Microbiology results:  None  Thank you for allowing pharmacy to be a part of this patient's care.  Peggyann Juba, PharmD, BCPS Pager: [phone] 11/22/2018 12:37 PM

## 2018-11-22 NOTE — ED Notes (Signed)
MD at bedside. 

## 2018-11-22 NOTE — Progress Notes (Signed)
A consult was received from an ED physician for vancomycin and zosyn per pharmacy dosing.  The patient's profile has been reviewed for ht/wt/allergies/indication/available labs.   A one time order has been placed for vancomycin 2g and zosyn 3.375g.    Further antibiotics/pharmacy consults should be ordered by admitting physician if indicated.                       Thank you, Peggyann Juba, PharmD, BCPS Pager: [phone] 11/22/2018  11:01 AM

## 2018-11-22 NOTE — Brief Op Note (Signed)
11/22/2018  3:35 PM  PATIENT:  Taylor Hughes  48 y.o. male  PRE-OPERATIVE DIAGNOSIS:  neck abcess  POST-OPERATIVE DIAGNOSIS:  neck abscess   PROCEDURE:  Procedure(s): INCISION AND DRAINAGE OF NECK ABSCESS (N/A)  SURGEON:  Surgeon(s) and Role:    Melida Quitter, MD - Primary  PHYSICIAN ASSISTANT:   ASSISTANTS: none   ANESTHESIA:   general  EBL: Minimal  BLOOD ADMINISTERED:none  DRAINS: Wound packing with gauze   LOCAL MEDICATIONS USED:  LIDOCAINE   SPECIMEN:  Source of Specimen:  Neck abscess culture  DISPOSITION OF SPECIMEN:  MICRO  COUNTS:  YES  TOURNIQUET:  * No tourniquets in log *  DICTATION: .Other Dictation: Dictation Number [id number]  PLAN OF CARE: Admit to inpatient   PATIENT DISPOSITION:  PACU - hemodynamically stable.   Delay start of Pharmacological VTE agent (>24hrs) due to surgical blood loss or risk of bleeding: no

## 2018-11-22 NOTE — Anesthesia Procedure Notes (Signed)
Procedure Name: Intubation Date/Time: 11/22/2018 2:54 PM Performed by: West Pugh, CRNA Pre-anesthesia Checklist: Patient identified, Emergency Drugs available, Suction available, Patient being monitored and Timeout performed Patient Re-evaluated:Patient Re-evaluated prior to induction Oxygen Delivery Method: Circle system utilized Preoxygenation: Pre-oxygenation with 100% oxygen Induction Type: IV induction and Rapid sequence Laryngoscope Size: Glidescope and 3 Grade View: Grade I Tube type: Oral Tube size: 7.5 mm Number of attempts: 1 Airway Equipment and Method: Rigid stylet Placement Confirmation: ETT inserted through vocal cords under direct vision,  positive ETCO2,  CO2 detector and breath sounds checked- equal and bilateral Secured at: 22 cm Tube secured with: Tape Dental Injury: Teeth and Oropharynx as per pre-operative assessment

## 2018-11-22 NOTE — ED Notes (Signed)
Short stay coming to get the patient.

## 2018-11-22 NOTE — ED Provider Notes (Signed)
[facility] DEPT Provider Note   CSN: [id number] Arrival date & time: 11/22/18  7829    History   Chief Complaint Chief Complaint  Patient presents with   Chest Pain   Hoarse    HPI Taylor Hughes is a 48 y.o. male.     The history is provided by the patient and medical records.  Chest Pain  Pain location:  Substernal area and L chest Pain quality: crushing and pressure   Pain radiates to:  Neck and L shoulder Pain severity:  Severe Onset quality:  Sudden Duration:  3 days Timing:  Constant Progression:  Worsening Chronicity:  New Context: at rest   Relieved by:  Nothing Worsened by:  Certain positions Ineffective treatments: muscle relaxants. Associated symptoms: lower extremity edema and shortness of breath   Associated symptoms: no abdominal pain, no back pain, no cough, no diaphoresis, no dizziness, no fatigue, no fever, no headache, no nausea, no numbness, no palpitations, no syncope, no vomiting and no weakness   Risk factors: diabetes mellitus, male sex, obesity and surgery   Risk factors: no coronary artery disease     Past Medical History:  Diagnosis Date   Diabetes mellitus without complication ([place])    Wears glasses    Wound, open, foot    left diabetic     Patient Active Problem List   Diagnosis Date Noted   Sepsis due to group B Streptococcus ([place])    Diabetic infection of left foot ([place])    DKA (diabetic ketoacidoses) ([place]) 07/30/2018   Left foot infection    DKA, type 2 ([place]) 07/28/2018   Leukocytosis 07/28/2018   Hyperkalemia 07/28/2018   Hyponatremia 07/28/2018    Past Surgical History:  Procedure Laterality Date   AMPUTATION Left 08/20/2018   Procedure: LEFT FOOT IRRIGATON AND DEBRIDEMENT, 5TH RAY AMPUTATION;  Surgeon: Newt Minion, MD;  Location: [facility];  Service: Orthopedics;  Laterality: Left;   I&D EXTREMITY Left 07/30/2018   Procedure: IRRIGATION AND DEBRIDEMENT LEFT FOOT WITH POSSIBLE  AMPUTATION OF FIFTH TOE;  Surgeon: Mcarthur Rossetti, MD;  Location: [facility];  Service: Orthopedics;  Laterality: Left;        Home Medications    Prior to Admission medications   Medication Sig Start Date End Date Taking? Authorizing Provider  doxycycline (VIBRA-TABS) 100 MG tablet Take 1 tablet (100 mg total) by mouth 2 (two) times daily. 08/27/18   Newt Minion, MD  insulin aspart (NOVOLOG) 100 UNIT/ML injection Inject 4 Units into the skin 3 (three) times daily with meals. 08/05/18   Dana Allan I, MD  insulin glargine (LANTUS) 100 UNIT/ML injection Inject 0.12 mLs (12 Units total) into the skin 2 (two) times daily. 08/05/18   Dana Allan I, MD  loperamide (IMODIUM A-D) 2 MG tablet Take 6 mg by mouth daily as needed for diarrhea or loose stools.    [provider]  metoprolol tartrate (LOPRESSOR) 25 MG tablet Take 1 tablet (25 mg total) by mouth 2 (two) times daily. 08/05/18 08/05/19  Dana Allan I, MD  oxyCODONE-acetaminophen (PERCOCET/ROXICET) 5-325 MG tablet Take 1 tablet by mouth every 4 (four) hours as needed. 08/20/18   Newt Minion, MD    Family History Family History  Problem Relation Age of Onset   Diabetes Mother     Social History Social History   Tobacco Use   Smoking status: Never Smoker   Smokeless tobacco: Never Used  Substance Use Topics   Alcohol use: No  Alcohol/week: 0.0 standard drinks   Drug use: No     Allergies   Patient has no known allergies.   Review of Systems Review of Systems  Constitutional: Negative for chills, diaphoresis, fatigue and fever.  HENT: Negative for congestion.   Eyes: Negative for visual disturbance.  Respiratory: Positive for shortness of breath. Negative for cough, choking, chest tightness, wheezing and stridor.   Cardiovascular: Positive for chest pain and leg swelling. Negative for palpitations and syncope.  Gastrointestinal: Negative for abdominal pain, constipation,  diarrhea, nausea and vomiting.  Genitourinary: Negative for flank pain and frequency.  Musculoskeletal: Positive for neck pain. Negative for back pain and neck stiffness.  Skin: Positive for wound (well healing on leg). Negative for rash.  Neurological: Negative for dizziness, weakness, light-headedness, numbness and headaches.  Psychiatric/Behavioral: Negative for agitation.  All other systems reviewed and are negative.    Physical Exam Updated Vital Signs BP (!) 171/101 (BP Location: Left Arm)    Pulse (!) 119    Temp 98.2 F (36.8 C)    Resp 16    Ht 5\' 11"  (1.803 m)    Wt 100.7 kg    SpO2 99%    BMI 30.96 kg/m   Physical Exam Vitals signs and nursing note reviewed.  Constitutional:      General: He is not in acute distress.    Appearance: He is well-developed. He is obese. He is not ill-appearing, toxic-appearing or diaphoretic.  HENT:     Head: Normocephalic and atraumatic.  Eyes:     Conjunctiva/sclera: Conjunctivae normal.     Pupils: Pupils are equal, round, and reactive to light.  Neck:     Musculoskeletal: Edema and pain with movement present. No erythema.     Thyroid: Thyroid tenderness present.     Trachea: No tracheal deviation.   Cardiovascular:     Rate and Rhythm: Regular rhythm. Tachycardia present.     Heart sounds: Normal heart sounds. No murmur. No systolic murmur.  Pulmonary:     Effort: Pulmonary effort is normal. No respiratory distress.     Breath sounds: Normal breath sounds. No decreased breath sounds, wheezing, rhonchi or rales.  Chest:     Chest wall: Tenderness present.     Comments: Swelling on chest/neck  Abdominal:     General: There is no abdominal bruit.     Palpations: Abdomen is soft.     Tenderness: There is no abdominal tenderness.  Musculoskeletal:     Right lower leg: He exhibits no tenderness. Edema present.     Left lower leg: He exhibits no tenderness. Edema (L Worse than R) present.  Skin:    General: Skin is warm and dry.       Capillary Refill: Capillary refill takes less than 2 seconds.     Findings: No erythema.  Neurological:     General: No focal deficit present.     Mental Status: He is alert.      ED Treatments / Results  Labs (all labs ordered are listed, but only abnormal results are displayed) Labs Reviewed  CBC WITH DIFFERENTIAL/PLATELET - Abnormal; Notable for the following components:      Result Value   WBC 26.6 (*)    RBC 4.17 (*)    Hemoglobin 12.1 (*)    RDW 16.8 (*)    Neutro Abs 23.6 (*)    Monocytes Absolute 1.8 (*)    Abs Immature Granulocytes 0.35 (*)    All other components within normal limits  COMPREHENSIVE METABOLIC PANEL - Abnormal; Notable for the following components:   Sodium 125 (*)    Potassium 5.5 (*)    Chloride 93 (*)    CO2 9 (*)    Glucose, Bld 689 (*)    Creatinine, Ser 1.39 (*)    Total Protein 8.9 (*)    Albumin 3.2 (*)    Alkaline Phosphatase 138 (*)    Total Bilirubin 1.7 (*)    Anion gap 23 (*)    All other components within normal limits  PROTIME-INR - Abnormal; Notable for the following components:   Prothrombin Time 15.3 (*)    All other components within normal limits  URINALYSIS, ROUTINE W REFLEX MICROSCOPIC - Abnormal; Notable for the following components:   Color, Urine STRAW (*)    Glucose, UA >=500 (*)    Hgb urine dipstick MODERATE (*)    Ketones, ur 80 (*)    Bacteria, UA RARE (*)    All other components within normal limits  BLOOD GAS, VENOUS - Abnormal; Notable for the following components:   pH, Ven 7.186 (*)    pCO2, Ven 25.7 (*)    pO2, Ven 46.4 (*)    Bicarbonate 9.3 (*)    Acid-base deficit 17.6 (*)    All other components within normal limits  CBG MONITORING, ED - Abnormal; Notable for the following components:   Glucose-Capillary 556 (*)    All other components within normal limits  CBG MONITORING, ED - Abnormal; Notable for the following components:   Glucose-Capillary 462 (*)    All other components within normal  limits  TROPONIN I  LACTIC ACID, PLASMA  LACTIC ACID, PLASMA  BLOOD GAS, VENOUS  TROPONIN I  BASIC METABOLIC PANEL  BASIC METABOLIC PANEL  BASIC METABOLIC PANEL  BASIC METABOLIC PANEL    EKG EKG Interpretation  Date/Time:  Monday November 22 2018 09:08:39 EDT Ventricular Rate:  116 PR Interval:    QRS Duration: 80 QT Interval:  293 QTC Calculation: 407 R Axis:   44 Text Interpretation:  Sinus tachycardia Atrial premature complex Probable left atrial enlargement When compared to prior, similar tachycardia.  No STEMI Confirmed by Antony Blackbird [phone]) on 11/22/2018 9:17:26 AM   Radiology Ct Soft Tissue Neck W Contrast  Result Date: 11/22/2018 CLINICAL DATA:  Sudden onset of swelling in the suprasternal region EXAM: CT NECK WITH CONTRAST TECHNIQUE: Multidetector CT imaging of the neck was performed using the standard protocol following the bolus administration of intravenous contrast. CONTRAST:  137mL OMNIPAQUE IOHEXOL 350 MG/ML SOLN COMPARISON:  None. FINDINGS: Pharynx and larynx: No mucosal or submucosal lesion is seen. Salivary glands: Parotid and submandibular glands are normal. Thyroid: Normal Lymph nodes: No enlarged or low-density nodes seen in the neck itself. Normal bilateral nodes. Vascular: Normal Limited intracranial: Normal Visualized orbits: Normal Mastoids and visualized paranasal sinuses: Clear Skeleton: No acute skeletal abnormality is seen. Upper chest: There appears to be asymmetric swelling of the left pectoralis musculature. There is extensive soft tissue abnormality at the thoracic inlet anteriorly extending more towards the left. The area of concern measures about 6 cm in diameter and contains a few tiny air bubbles. This is most consistent with phlegmonous inflammatory change. Most commonly, this might emanate from a septic sternoclavicular joint, but there is no distinct evidence of that on the CT. Other: None IMPRESSION: Phlegmonous inflammatory change at the thoracic  inlet anteriorly, more to the left, measuring about 6 cm in diameter with a few tiny air bubbles. Asymmetric enlargement  of the left pectoralis muscle suggesting myositis. Often, infection in this region will arise from the sternoclavicular joint, but I cannot establish that in this case. Electronically Signed   By: Nelson Chimes M.D.   On: 11/22/2018 11:00   Ct Angio Chest Pe W And/or Wo Contrast  Result Date: 11/22/2018 CLINICAL DATA:  Acute onset chest pain 2 days ago after trying to start lawn mower. Chest pain, shortness of breath, and tachycardia. EXAM: CT ANGIOGRAPHY CHEST WITH CONTRAST TECHNIQUE: Multidetector CT imaging of the chest was performed using the standard protocol during bolus administration of intravenous contrast. Multiplanar CT image reconstructions and MIPs were obtained to evaluate the vascular anatomy. CONTRAST:  160mL OMNIPAQUE IOHEXOL 350 MG/ML SOLN COMPARISON:  None. FINDINGS: Cardiovascular: Satisfactory opacification of pulmonary arteries noted, however evaluation of peripheral pulmonary arteries is limited by significant respiratory motion artifact. No central pulmonary emboli identified. No evidence of thoracic aortic dissection or aneurysm. Mediastinum/Nodes: No masses or pathologically enlarged lymph nodes identified within the mediastinum. Abnormal soft tissue density is seen in the anterior lower neck just above the sternal notch. Lungs/Pleura: No pulmonary mass, infiltrate, or effusion. Mild right lower lobe scarring noted. Upper abdomen: No acute findings. Musculoskeletal: No suspicious bone lesions identified. Multiple old right rib fracture deformities noted. Review of the MIP images confirms the above findings. IMPRESSION: 1. Suboptimal evaluation of peripheral pulmonary arteries due to significant respiratory motion artifact. No central pulmonary emboli identified. 2. No evidence of thoracic aortic dissection or aneurysm. 3. Abnormal soft tissue density in anterior lower  neck above the sternal notch. See separate report for neck CT also performed today. Electronically Signed   By: Earle Gell M.D.   On: 11/22/2018 11:29   Vas [place] Lower Extremity Venous (dvt) (only [facility])  Result Date: 11/22/2018  Lower Venous Study Indications: Edema, and SOB.  Limitations: Body habitus and interstitial edema. Comparison Study: Prior negative study for comparison from 07/29/18 Performing Technologist: Sharion Dove RVS  Examination Guidelines: A complete evaluation includes B-mode imaging, spectral Doppler, color Doppler, and power Doppler as needed of all accessible portions of each vessel. Bilateral testing is considered an integral part of a complete examination. Limited examinations for reoccurring indications may be performed as noted.  Right Venous Findings: +---+---------------+---------+-----------+----------+-------+      Compressibility Phasicity Spontaneity Properties Summary  +---+---------------+---------+-----------+----------+-------+  CFV Full            Yes       Yes                             +---+---------------+---------+-----------+----------+-------+  Left Venous Findings: +---------+---------------+---------+-----------+----------+--------------+            Compressibility Phasicity Spontaneity Properties Summary         +---------+---------------+---------+-----------+----------+--------------+  CFV       Full            Yes       Yes                                    +---------+---------------+---------+-----------+----------+--------------+  SFJ       Full                                                             +---------+---------------+---------+-----------+----------+--------------+  FV Prox   Full                                                             +---------+---------------+---------+-----------+----------+--------------+  FV Mid    Full                                                              +---------+---------------+---------+-----------+----------+--------------+  FV Distal Full                                                             +---------+---------------+---------+-----------+----------+--------------+  PFV       Full                                                             +---------+---------------+---------+-----------+----------+--------------+  POP       Full            Yes       Yes                                    +---------+---------------+---------+-----------+----------+--------------+  PTV       Full                                                             +---------+---------------+---------+-----------+----------+--------------+  PERO                                                       Not visualized  +---------+---------------+---------+-----------+----------+--------------+    Summary: Right: No evidence of common femoral vein obstruction. Left: There is no evidence of deep vein thrombosis in the lower extremity. However, portions of this examination were limited- see technologist comments above.  *See table(s) above for measurements and observations.    Preliminary     Procedures Procedures (including critical care time)  CRITICAL CARE Performed by: Gwenyth Allegra Joesph Marcy Total critical care time: 60 minutes Critical care time was exclusive of separately billable procedures and treating other patients. Critical care was necessary to treat or prevent imminent or life-threatening deterioration. Critical care was time spent personally by me on the following activities: development of treatment plan with patient and/or surrogate as well as nursing, discussions with consultants, evaluation of patient's response to treatment, examination of patient, obtaining history from  patient or surrogate, ordering and performing treatments and interventions, ordering and review of laboratory studies, ordering and review of radiographic studies, pulse oximetry and  re-evaluation of patient's condition.   Medications Ordered in ED Medications  sodium chloride (PF) 0.9 % injection (has no administration in time range)  insulin regular, human (MYXREDLIN) 100 units/ 100 mL infusion (4 Units/hr Intravenous Rate/Dose Change 11/22/18 1219)  sodium chloride 0.9 % bolus 1,000 mL (1,000 mLs Intravenous New Bag/Given 11/22/18 1103)    And  0.9 %  sodium chloride infusion ( Intravenous New Bag/Given 11/22/18 1225)  piperacillin-tazobactam (ZOSYN) IVPB 3.375 g (3.375 g Intravenous New Bag/Given 11/22/18 1228)  vancomycin (VANCOCIN) 2,000 mg in sodium chloride 0.9 % 500 mL IVPB (2,000 mg Intravenous New Bag/Given 11/22/18 1227)  morphine 2 MG/ML injection 1-2 mg (has no administration in time range)  piperacillin-tazobactam (ZOSYN) IVPB 3.375 g (has no administration in time range)  vancomycin (VANCOCIN) 1,500 mg in sodium chloride 0.9 % 500 mL IVPB (has no administration in time range)  ondansetron (ZOFRAN) injection 4 mg (has no administration in time range)  sodium chloride 0.9 % bolus 1,000 mL (0 mLs Intravenous Stopped 11/22/18 1103)  iohexol (OMNIPAQUE) 350 MG/ML injection 100 mL (100 mLs Intravenous Contrast Given 11/22/18 1018)     Initial Impression / Assessment and Plan / ED Course  I have reviewed the triage vital signs and the nursing notes.  Pertinent labs & imaging results that were available during my care of the patient were reviewed by me and considered in my medical decision making (see chart for details).        Taylor Hughes is a 48 y.o. male with a past medical history significant for diabetes, prior DKA, and recent diabetic infection of his left foot status post amputation several months ago who presents with several complaints including chest pain, shortness of breath, neck pain, neck swelling, left shoulder pain, and left leg swelling.  Patient reports that his left leg has been swollen since his surgery 2 months ago.  He says that he initially  was on blood thinners but then the prescription ran out and he stopped taking it.  He has no history of blood clot to his knowledge.  He reports that 2 days ago, he had sudden onset of chest pain rating to his left shoulder into his neck.  He says that he was trying to start his lawnmower and using the pull starter when he felt the pain began.  He reports pain in his chest that is pressure and tightness going towards his neck.  He reports over the last 24 hours his neck has swollen in the anterior sternal/manubrial notch area.  He says his voice is change in his voice sounds hoarse.  He has pain with neck movement.  He has no history of dissection or other neck injury in the past.  Denies headache.  Denies numbness, tingling, weakness of extremities.  He denies any diaphoresis nausea, or vomiting.  No history of MI.  He describes his pain as 7 or 8 out of 10 in severity with the pressure in his neck and chest.  He is having associated shortness of breath and is tachypneic.  On exam, patient's heart rate is in the 120s on arrival.  He is tachypneic in the upper 20s.  He is having normal oxygen saturations on room air.  He is afebrile.  Blood pressure is not hypotensive.  Patient's lungs were clear and he had no murmur.  Patient had  minimal anterior chest tenderness but did have tenderness on his upper chest near the sternal notch.  There is an area of swelling into his neck.  I could not palpate crepitance however it was very swollen.  There was no stridor.   Ultrasound probe was utilized and there did not appear to be an abscess and there was not blood flow in the swollen area.  Do not feel there is a pulsatile area.  Abdomen was nontender.  Legs were edematous bilaterally left worse than right.  Patient has healing amputation/wound on his left toes.  He has no other complaints in his foot.  Back nontender no CVA tenderness.  Pain with neck range of motion.  Clinical concern about several things.  With his  recent surgery, left leg swelling, chest pain, tachycardia, and shortness of breath, I need to rule out pulmonary embolism.  I am also concerned about his chest pain related to his neck.  With his strain and pulling at the onset, I am concerned about a tracheal injury or some sort of vascular injury.  I have lower suspicion for a vertebral artery dissection given his lack of headache, posterior neck pain, and he is having no focal neurologic deficits.  We will also get labs and EKG to rule out a cardiac cause of symptoms.  Spoke with radiology who recommended a PE study for the chest as well as a soft tissue neck CT.  This will help identify any subcutaneous gas, fluid collections, and gross abnormalities with his vessels would be seen.  If further CTA is needed for the neck they report that could be done later.  Patient is agreeable to work-up.  He will given fluids for his tachycardia.  Anticipate reassessment after work-up.      Patient's work-up again to return.  He is discovered to be in DKA with a pH of 7.1 and bicarb of 9.3.  Patient's white count elevated at 26.6 concerning for infection.  CT scanning was reviewed by me personally and I spoke with radiology about the results.  They do not see a large pulmonary embolism although there was some motion artifact.  What they do see is concern for infection in his anterior neck/upper chest that has some gas involved.  They suspect a infected hematoma may be there given the clinical context.  They do not see lots of air which would be more consistent with a tracheal injury and I do not see mediastinitis at this time.  Patient will be given antibiotics for infection and started on insulin drip for DKA.  Patient now reports that he has not taken his glucose medications in the last 2 days because of the pain.  Patient given broad-spectrum antibiotics and will be admitted for the DKA and infection.  As the infection is not going to the chest, do not feel that  cardiothoracic surgery will have a role in his care at this time.  If infection worsens and spreads inferiorly, this may be needed.  As he is not having stridor and there is no airway compromise at this time, will hold on otolaryngology consult.  Will admit to medicine service.  11:38 AM CT scan of the neck was read and finalized.  They raise concern for possible myositis of the left pectoralis muscle and possible septic sternoclavicular joint.  Spoke with admitting hospitalist team who requested I consult CT surgery.  This consult was placed.  Will discuss with them if they need to be involved or if  it is appropriate to continue admitting patient here at [facility] long to medicine service.  11:47 AM CT surgery said that they request the patient be admitted to [facility] so they can see the patient as they may need to perform a drainage on the infection site.  Patient will be made n.p.o.  12:01 PM Cardiothoracic surgery called back and after looking at the images, do not feel this is a thoracic problem.  They recommend speaking with otolaryngology for this neck infection for further management.  They will be called.  ENT called back and looked at the imaging.  They do not feel patient needs surgery at this time however I did recommend admission to [facility] long to hospital service and if any symptoms worsen, consider repeat CT scan and trend his labs.  If needed, they can be called later if symptoms worsen.  Patient admitted to hospital service for further management.   Final Clinical Impressions(s) / ED Diagnoses   Final diagnoses:  Diabetic ketoacidosis without coma associated with other specified diabetes mellitus ([place])  Cellulitis of chest wall  Chest pain, unspecified type  Shortness of breath    ED Discharge Orders    None      Clinical Impression: 1. Diabetic ketoacidosis without coma associated with other specified diabetes mellitus ([place])   2. Cellulitis of chest wall   3. Chest  pain, unspecified type   4. Shortness of breath     Disposition: Admit  This note was prepared with assistance of Dragon voice recognition software. Occasional wrong-word or sound-a-like substitutions may have occurred due to the inherent limitations of voice recognition software.     Cailen Texeira, Gwenyth Allegra, MD 11/22/18 1255

## 2018-11-22 NOTE — ED Notes (Signed)
Report given to receiving RN.

## 2018-11-22 NOTE — Progress Notes (Addendum)
VASCULAR LAB PRELIMINARY  PRELIMINARY  PRELIMINARY  PRELIMINARY  Left lower extremity venous duplexcompleted.    Preliminary report:  See CV proc for preliminary results.  Gave report to Dr. Sherry Ruffing.  Jasma Seevers, RVT 11/22/2018, 10:21 AM

## 2018-11-23 ENCOUNTER — Encounter (HOSPITAL_COMMUNITY): Payer: Self-pay | Admitting: Otolaryngology

## 2018-11-23 DIAGNOSIS — E669 Obesity, unspecified: Secondary | ICD-10-CM | POA: Diagnosis present

## 2018-11-23 DIAGNOSIS — N179 Acute kidney failure, unspecified: Secondary | ICD-10-CM | POA: Diagnosis present

## 2018-11-23 DIAGNOSIS — E1169 Type 2 diabetes mellitus with other specified complication: Secondary | ICD-10-CM

## 2018-11-23 DIAGNOSIS — I10 Essential (primary) hypertension: Secondary | ICD-10-CM | POA: Diagnosis present

## 2018-11-23 DIAGNOSIS — L0211 Cutaneous abscess of neck: Secondary | ICD-10-CM | POA: Diagnosis present

## 2018-11-23 DIAGNOSIS — Z794 Long term (current) use of insulin: Secondary | ICD-10-CM

## 2018-11-23 DIAGNOSIS — A419 Sepsis, unspecified organism: Principal | ICD-10-CM

## 2018-11-23 LAB — GLUCOSE, CAPILLARY
Glucose-Capillary: 136 mg/dL — ABNORMAL HIGH (ref 70–99)
Glucose-Capillary: 144 mg/dL — ABNORMAL HIGH (ref 70–99)
Glucose-Capillary: 148 mg/dL — ABNORMAL HIGH (ref 70–99)
Glucose-Capillary: 156 mg/dL — ABNORMAL HIGH (ref 70–99)
Glucose-Capillary: 160 mg/dL — ABNORMAL HIGH (ref 70–99)
Glucose-Capillary: 166 mg/dL — ABNORMAL HIGH (ref 70–99)
Glucose-Capillary: 167 mg/dL — ABNORMAL HIGH (ref 70–99)
Glucose-Capillary: 175 mg/dL — ABNORMAL HIGH (ref 70–99)
Glucose-Capillary: 180 mg/dL — ABNORMAL HIGH (ref 70–99)
Glucose-Capillary: 187 mg/dL — ABNORMAL HIGH (ref 70–99)
Glucose-Capillary: 190 mg/dL — ABNORMAL HIGH (ref 70–99)
Glucose-Capillary: 207 mg/dL — ABNORMAL HIGH (ref 70–99)
Glucose-Capillary: 213 mg/dL — ABNORMAL HIGH (ref 70–99)
Glucose-Capillary: 218 mg/dL — ABNORMAL HIGH (ref 70–99)
Glucose-Capillary: 238 mg/dL — ABNORMAL HIGH (ref 70–99)

## 2018-11-23 LAB — CBC
HCT: 34.1 % — ABNORMAL LOW (ref 39.0–52.0)
Hemoglobin: 11.1 g/dL — ABNORMAL LOW (ref 13.0–17.0)
MCH: 29.1 pg (ref 26.0–34.0)
MCHC: 32.6 g/dL (ref 30.0–36.0)
MCV: 89.5 fL (ref 80.0–100.0)
Platelets: 271 10*3/uL (ref 150–400)
RBC: 3.81 MIL/uL — ABNORMAL LOW (ref 4.22–5.81)
RDW: 16.7 % — ABNORMAL HIGH (ref 11.5–15.5)
WBC: 19.3 10*3/uL — ABNORMAL HIGH (ref 4.0–10.5)
nRBC: 0 % (ref 0.0–0.2)

## 2018-11-23 LAB — BASIC METABOLIC PANEL
Anion gap: 6 (ref 5–15)
Anion gap: 7 (ref 5–15)
Anion gap: 8 (ref 5–15)
Anion gap: 8 (ref 5–15)
BUN: 12 mg/dL (ref 6–20)
BUN: 14 mg/dL (ref 6–20)
BUN: 15 mg/dL (ref 6–20)
BUN: 17 mg/dL (ref 6–20)
CO2: 17 mmol/L — ABNORMAL LOW (ref 22–32)
CO2: 17 mmol/L — ABNORMAL LOW (ref 22–32)
CO2: 18 mmol/L — ABNORMAL LOW (ref 22–32)
CO2: 19 mmol/L — ABNORMAL LOW (ref 22–32)
Calcium: 7.9 mg/dL — ABNORMAL LOW (ref 8.9–10.3)
Calcium: 8.1 mg/dL — ABNORMAL LOW (ref 8.9–10.3)
Calcium: 8.2 mg/dL — ABNORMAL LOW (ref 8.9–10.3)
Calcium: 8.3 mg/dL — ABNORMAL LOW (ref 8.9–10.3)
Chloride: 105 mmol/L (ref 98–111)
Chloride: 109 mmol/L (ref 98–111)
Chloride: 109 mmol/L (ref 98–111)
Chloride: 111 mmol/L (ref 98–111)
Creatinine, Ser: 0.74 mg/dL (ref 0.61–1.24)
Creatinine, Ser: 0.75 mg/dL (ref 0.61–1.24)
Creatinine, Ser: 0.78 mg/dL (ref 0.61–1.24)
Creatinine, Ser: 0.8 mg/dL (ref 0.61–1.24)
GFR calc Af Amer: >60 mL/min (ref >60)
GFR calc Af Amer: >60 mL/min (ref >60)
GFR calc Af Amer: >60 mL/min (ref >60)
GFR calc Af Amer: >60 mL/min (ref >60)
GFR calc non Af Amer: >60 mL/min (ref >60)
GFR calc non Af Amer: >60 mL/min (ref >60)
GFR calc non Af Amer: >60 mL/min (ref >60)
GFR calc non Af Amer: >60 mL/min (ref >60)
Glucose, Bld: 146 mg/dL — ABNORMAL HIGH (ref 70–99)
Glucose, Bld: 154 mg/dL — ABNORMAL HIGH (ref 70–99)
Glucose, Bld: 202 mg/dL — ABNORMAL HIGH (ref 70–99)
Glucose, Bld: 204 mg/dL — ABNORMAL HIGH (ref 70–99)
Potassium: 3.5 mmol/L (ref 3.5–5.1)
Potassium: 3.7 mmol/L (ref 3.5–5.1)
Potassium: 3.8 mmol/L (ref 3.5–5.1)
Potassium: 3.9 mmol/L (ref 3.5–5.1)
Sodium: 132 mmol/L — ABNORMAL LOW (ref 135–145)
Sodium: 133 mmol/L — ABNORMAL LOW (ref 135–145)
Sodium: 134 mmol/L — ABNORMAL LOW (ref 135–145)
Sodium: 135 mmol/L (ref 135–145)

## 2018-11-23 LAB — PROCALCITONIN: Procalcitonin: 1.84 ng/mL

## 2018-11-23 MED ORDER — ACETAMINOPHEN 325 MG PO TABS
650.0000 mg | ORAL_TABLET | Freq: Four times a day (QID) | ORAL | Status: DC | PRN
  Filled 2018-11-23 (×2): qty 2

## 2018-11-23 MED ORDER — SODIUM CHLORIDE 0.9 % IV SOLN
INTRAVENOUS | Status: DC
  Administered 2018-11-23 – 2018-11-25 (×4): via INTRAVENOUS

## 2018-11-23 MED ORDER — MORPHINE SULFATE (PF) 2 MG/ML IV SOLN
1.0000 mg | Freq: Once | INTRAVENOUS | Status: AC
  Administered 2018-11-23: 1 mg via INTRAVENOUS
  Filled 2018-11-23: qty 1

## 2018-11-23 MED ORDER — INSULIN ASPART 100 UNIT/ML ~~LOC~~ SOLN
0.0000 [IU] | Freq: Three times a day (TID) | SUBCUTANEOUS | Status: DC
  Administered 2018-11-23: 12:00:00 2 [IU] via SUBCUTANEOUS
  Administered 2018-11-23: 17:00:00 3 [IU] via SUBCUTANEOUS
  Administered 2018-11-24: 2 [IU] via SUBCUTANEOUS
  Administered 2018-11-24 (×2): 3 [IU] via SUBCUTANEOUS
  Administered 2018-11-25: 13:00:00 1 [IU] via SUBCUTANEOUS
  Administered 2018-11-25: 8 [IU] via SUBCUTANEOUS
  Administered 2018-11-27 – 2018-11-28 (×3): 2 [IU] via SUBCUTANEOUS
  Administered 2018-11-28: 3 [IU] via SUBCUTANEOUS
  Administered 2018-11-29: 2 [IU] via SUBCUTANEOUS

## 2018-11-23 MED ORDER — NALOXONE HCL 0.4 MG/ML IJ SOLN: 0.4000 mg | INTRAMUSCULAR | Status: DC | PRN

## 2018-11-23 MED ORDER — METOPROLOL TARTRATE 25 MG PO TABS
25.0000 mg | ORAL_TABLET | Freq: Two times a day (BID) | ORAL | Status: DC
  Administered 2018-11-23 – 2018-11-25 (×6): 25 mg via ORAL
  Filled 2018-11-23 (×6): qty 1

## 2018-11-23 MED ORDER — SODIUM CHLORIDE 0.9% FLUSH: 9.0000 mL | INTRAVENOUS | Status: DC | PRN

## 2018-11-23 MED ORDER — HYDROMORPHONE 1 MG/ML IV SOLN
INTRAVENOUS | Status: DC
  Administered 2018-11-23: 30 mg via INTRAVENOUS
  Filled 2018-11-23: qty 30

## 2018-11-23 MED ORDER — DIPHENHYDRAMINE HCL 12.5 MG/5ML PO ELIX: 12.5000 mg | ORAL_SOLUTION | Freq: Four times a day (QID) | ORAL | Status: DC | PRN

## 2018-11-23 MED ORDER — VANCOMYCIN HCL 10 G IV SOLR
1750.0000 mg | Freq: Two times a day (BID) | INTRAVENOUS | Status: DC
  Administered 2018-11-23: 10:00:00 1750 mg via INTRAVENOUS
  Filled 2018-11-23 (×2): qty 1750

## 2018-11-23 MED ORDER — IBUPROFEN 200 MG PO TABS
400.0000 mg | ORAL_TABLET | Freq: Three times a day (TID) | ORAL | Status: DC
  Administered 2018-11-23 – 2018-11-29 (×18): 400 mg via ORAL
  Filled 2018-11-23 (×18): qty 2

## 2018-11-23 MED ORDER — MORPHINE SULFATE (PF) 4 MG/ML IV SOLN
8.0000 mg | Freq: Two times a day (BID) | INTRAVENOUS | Status: DC | PRN
  Administered 2018-11-23: 09:00:00 8 mg via INTRAVENOUS

## 2018-11-23 MED ORDER — KCL IN DEXTROSE-NACL 20-5-0.45 MEQ/L-%-% IV SOLN
INTRAVENOUS | Status: DC
  Administered 2018-11-23: 09:00:00 via INTRAVENOUS
  Filled 2018-11-23: qty 1000

## 2018-11-23 MED ORDER — CHLORHEXIDINE GLUCONATE CLOTH 2 % EX PADS
6.0000 | MEDICATED_PAD | Freq: Every day | CUTANEOUS | Status: DC
  Administered 2018-11-23: 17:00:00 6 via TOPICAL

## 2018-11-23 MED ORDER — ZOLPIDEM TARTRATE 5 MG PO TABS: 5.0000 mg | ORAL_TABLET | Freq: Every evening | ORAL | Status: DC | PRN

## 2018-11-23 MED ORDER — DIPHENHYDRAMINE HCL 50 MG/ML IJ SOLN: 12.5000 mg | Freq: Four times a day (QID) | INTRAMUSCULAR | Status: DC | PRN

## 2018-11-23 MED ORDER — ENOXAPARIN SODIUM 40 MG/0.4ML ~~LOC~~ SOLN: 40.0000 mg | SUBCUTANEOUS | Status: DC

## 2018-11-23 MED ORDER — POTASSIUM CHLORIDE CRYS ER 20 MEQ PO TBCR
40.0000 meq | EXTENDED_RELEASE_TABLET | Freq: Once | ORAL | Status: AC
  Administered 2018-11-23: 40 meq via ORAL
  Filled 2018-11-23: qty 2

## 2018-11-23 MED ORDER — ONDANSETRON HCL 4 MG/2ML IJ SOLN: 4.0000 mg | Freq: Four times a day (QID) | INTRAMUSCULAR | Status: DC | PRN

## 2018-11-23 MED ORDER — SODIUM CHLORIDE 0.9 % IV SOLN
3.0000 g | Freq: Four times a day (QID) | INTRAVENOUS | Status: DC
  Administered 2018-11-23 – 2018-11-29 (×23): 3 g via INTRAVENOUS
  Filled 2018-11-23 (×25): qty 3

## 2018-11-23 MED ORDER — SODIUM CHLORIDE 0.9 % IV BOLUS
500.0000 mL | Freq: Once | INTRAVENOUS | Status: AC
  Administered 2018-11-23: 500 mL via INTRAVENOUS

## 2018-11-23 MED ORDER — LIP MEDEX EX OINT
TOPICAL_OINTMENT | CUTANEOUS | Status: DC | PRN
  Filled 2018-11-23: qty 7

## 2018-11-23 MED ORDER — ALPRAZOLAM 0.5 MG PO TABS
0.5000 mg | ORAL_TABLET | Freq: Three times a day (TID) | ORAL | Status: DC | PRN
  Administered 2018-11-25: 21:00:00 0.5 mg via ORAL
  Filled 2018-11-23: qty 1

## 2018-11-23 MED ORDER — PANTOPRAZOLE SODIUM 40 MG PO TBEC
40.0000 mg | DELAYED_RELEASE_TABLET | Freq: Every day | ORAL | Status: DC
  Administered 2018-11-23 – 2018-11-29 (×7): 40 mg via ORAL
  Filled 2018-11-23 (×7): qty 1

## 2018-11-23 MED ORDER — ORAL CARE MOUTH RINSE
15.0000 mL | Freq: Two times a day (BID) | OROMUCOSAL | Status: DC
  Administered 2018-11-23 – 2018-11-29 (×12): 15 mL via OROMUCOSAL

## 2018-11-23 MED ORDER — ENOXAPARIN SODIUM 40 MG/0.4ML ~~LOC~~ SOLN
40.0000 mg | SUBCUTANEOUS | Status: DC
  Administered 2018-11-24 – 2018-11-29 (×6): 40 mg via SUBCUTANEOUS
  Filled 2018-11-23 (×6): qty 0.4

## 2018-11-23 MED ORDER — SODIUM CHLORIDE 0.9 % IV BOLUS
1000.0000 mL | Freq: Once | INTRAVENOUS | Status: AC
  Administered 2018-11-23: 07:00:00 1000 mL via INTRAVENOUS

## 2018-11-23 MED ORDER — METOPROLOL TARTRATE 5 MG/5ML IV SOLN
5.0000 mg | INTRAVENOUS | Status: DC | PRN
  Administered 2018-11-23 – 2018-11-25 (×4): 5 mg via INTRAVENOUS
  Filled 2018-11-23 (×4): qty 5

## 2018-11-23 MED ORDER — MORPHINE SULFATE (PF) 4 MG/ML IV SOLN
INTRAVENOUS | Status: AC
  Filled 2018-11-23: qty 2

## 2018-11-23 MED ORDER — INSULIN DETEMIR 100 UNIT/ML ~~LOC~~ SOLN
20.0000 [IU] | Freq: Two times a day (BID) | SUBCUTANEOUS | Status: DC
  Administered 2018-11-23 – 2018-11-24 (×3): 20 [IU] via SUBCUTANEOUS
  Filled 2018-11-23 (×5): qty 0.2

## 2018-11-23 NOTE — Progress Notes (Signed)
   Subjective:    Patient ID: Taylor Hughes, male    DOB: [birth year], 48 y.o.   MRN: [id number]  HPI    Review of Systems     Objective:   Physical Exam        Assessment & Plan:  Changed wound packing.

## 2018-11-23 NOTE — Progress Notes (Addendum)
Pharmacy Antibiotic Note  Taylor Hughes is a 48 y.o. male admitted on 11/22/2018 with possible myositis of the left pectoralis muscle and possible septic sternoclavicular joint.  Pharmacy has been consulted for vancomycin and zosyn dosing.  Today, 11/23/2018:  Last fever 4/13 - 4 hrs after starting abx  WBC elevated but improving  AKI resolved; SCr back to baseline  LA resolved, PCT elevated but improving  Plan:  AUC recalculated with new SCr 0.8: increase vancomycin to 1750 mg IV q12h (AUC 503 using Vd 0.65 d/t borderline BMI)  Check vancomycin levels as needed, goal AUC 400-550  Continue Zosyn 3.375gm IV q8h (4hr extended infusions)  Most likely source for necrotizing infection of neck from mouth anaerobes or beta-hemolytic strep. Given GPC in abscess culture, consider stopping Zosyn or narrowing to Flagyl + Vanc  Height: 5\' 11"  (180.3 cm) Weight: 222 lb 0.1 oz (100.7 kg) IBW/kg (Calculated) : 75.3  Temp (24hrs), Avg:99.7 F (37.6 C), Min:98.5 F (36.9 C), Max:101 F (38.3 C)  Recent Labs  Lab 11/22/18 0909 11/22/18 1608 11/22/18 2033 11/23/18 0040 11/23/18 0453  WBC 26.6*  --   --   --  19.3*  CREATININE 1.39* 1.04 0.89 0.80 0.78  LATICACIDVEN 1.5  1.8  --   --   --   --     Estimated Creatinine Clearance: 139.5 mL/min (by C-G formula based on SCr of 0.78 mg/dL).    No Known Allergies  Antimicrobials this admission:  4/13 Vanc >> 4/13 Zosyn >>  Dose adjustments this admission:  4/14 increase vanc to 1750 q12 hr with CrCl 1.4 >> 0.8  Microbiology results:  4/13 BCx: sent 4/13 MRSA PCR: neg 4/13 abscess (surgical Cx): moderate GPC  Thank you for allowing pharmacy to be a part of this patient's care.  Reuel Boom, PharmD, BCPS [phone] 11/23/2018, 9:14 AM

## 2018-11-23 NOTE — Progress Notes (Signed)
Patient continue to have uncontrolled pain, will add hydromorphone PCA pump. Patient tachycardic, sinus tachycardia, per telemetry monitor.

## 2018-11-23 NOTE — Progress Notes (Signed)
   Subjective:    Patient ID: Taylor Hughes, male    DOB: 05-20-1971, 48 y.o.   MRN: [id number]  HPI Feeling much better this morning.  Comfortable.  Review of Systems     Objective:   Physical Exam Tm 101, Glucose 204, WBC 19.3 Alert, NAD Appears comfortable Lower left neck wound packed, changed packing    Assessment & Plan:  Neck abscess/necrotizing fasciitis, DKA  Changed gauze packing.  Plan twice daily packing changes.  Continue broad spectrum antibiotics, culture pending, gram stain GPC.  Diabetes management critical.

## 2018-11-23 NOTE — Progress Notes (Addendum)
PROGRESS NOTE    Taylor Hughes  PFX:[id number] DOB: 09-Dec-1970 DOA: 11/22/2018 PCP: Jilda Panda, MD    Brief Narrative:  48 year old male presented with pain in the left side of his neck.  He does have significant past medical history for type 2 diabetes mellitus and left diabetic foot.  Apparently he sustained a musculoskeletal injury while doing gardening 48 hours prior to hospitalization, left-sided chest pain that progressed into left neck pain with sensation of fullness in his left neck area/sternal area.  Reported not using his insulin for about 72 hours.  On physical examination his blood pressure was 176/98, heart rate 117, respiratory rate 31, oxygen saturation 100%, he had dry mucous membranes, fullness in his left lower neck area, positive tenderness and local erythema, his lungs were clear to auscultation bilaterally, heart S1-S2 present and rhythmic, the abdomen was soft nontender, no lower extremity edema.  Venous pH is 7.18, NA 125, potassium 5.5, chloride 93, bicarb 9, glucose 689, BUN 20, creatinine 1.39, anion gap 23, white count 26.6, hemoglobin 12.1, hematocrit 39.1, platelets 300, urinalysis with glucose greater than 500, 0-5 white cells, 0-5 red cells, specific gravity 1.023.  Neck CT with phlegmonous inflammatory changes at the thoracic inlet anteriorly, more to the left, measure about 6 cm in diameter with a few tiny air bubbles.  Asymmetric enlargement of the left pectoralis muscle suggesting myositis.  Patient was admitted to the hospital with a working diagnosis left neck abscess/ pectoralis muscle myositis complicated by diabetes ketoacidosis and acute kidney injury.   Assessment & Plan:   Principal Problem:   Sepsis ([place]) Active Problems:   DKA (diabetic ketoacidoses) ([place])   Neck abscess   Essential hypertension   AKI (acute kidney injury) ([place])   Obesity (BMI 30.0-34.9)   Type 2 diabetes mellitus with other specified complication ([place])  1. Diabetes ketoacidosis.  Patient with no nausea or vomiting, his anion gap has closed to 8 this am. Capillary glucose 190 and 160, will continue IV fluids with dextrose to prevent hypoglycemia. Advance diet to diabetic prudent, if good toleration will transition to sq insulin. Patient has used about 20 units over last 8 hours, calculated 24 h insulin requirement 60 units, will use 70% of total daily requirements, 40 units daily. Start patient on 20 units of insulin levemir bid and add insulin sliding scale for glucose monitoring.   2. Left neck abscess complicated with sepsis (present on admission). Source control with neck I&D, will continue antibiotic therapy with IV Unasyn for now, will follow on cultures, cell count and temperature curve. Continue to follow recommendations from Dr. Redmond Baseman (personally discussed the case with him). Wbc this am is down to 19 from 26. T max 37.8 C this am.   3. AKI. Renal function has improved, with serum cr at 0.78, will continue to follow renal function and electrolytes. K is 3.8 and serum bicarbonate at 17. Follow chemistry this pm 16:00.   4. HTN with reactive tachycardia. Telemetry personally reviewed with sinus tachycardia at 140 bpm, will continue pain control, resume metoprolol 25 mg bid and will add alprazolam as needed for anxiety. Patient clinically not hypovolemic.    5. Obesity. Calculated BMI is 30,9.   DVT prophylaxis: enoxaparin   Code Status:  full Family Communication: no family at the bedside  Disposition Plan/ discharge barriers: pending clinical improvement.   Body mass index is 30.96 kg/m. Malnutrition Type:      Malnutrition Characteristics:      Nutrition Interventions:  RN Pressure Injury Documentation:     Consultants:   ENT   Procedures:   Left neck abscess I&D  Antimicrobials:   Vancomycin   Zosyn     Subjective: Patient sp dressing changes, positive pain the surgical wound, positive anxiety. No nausea or vomiting, no abdominal  pain, no dyspnea or chest pain. Positive hunger this am.   Objective: Vitals:   11/23/18 0200 11/23/18 0300 11/23/18 0350 11/23/18 0600  BP: (!) 163/85 (!) 155/79 (!) 134/94 (!) 155/83  Pulse: (!) 150 (!) 103 (!) 145 (!) 146  Resp: 18 (!) 23 (!) 23 (!) 25  Temp:   99.5 F (37.5 C)   TempSrc:   Oral   SpO2: 99% 97% 96% 100%  Weight:      Height:        Intake/Output Summary (Last 24 hours) at 11/23/2018 0808 Last data filed at 11/23/2018 0600 Gross per 24 hour  Intake 2268.09 ml  Output 975 ml  Net 1293.09 ml   Filed Weights   11/22/18 0850 11/22/18 1730  Weight: 100.7 kg 100.7 kg    Examination:   General: deconditioned and in pain.  Neurology: Awake and alert, non focal  E ENT: no pallor, no icterus, oral mucosa moist Cardiovascular: No JVD. S1-S2 present, rhythmic, tachycardic, no gallops, rubs, or murmurs. No lower extremity edema. Pulmonary:  positive breath sounds bilaterally, adequate air movement, no wheezing, rhonchi or rales. Gastrointestinal. Abdomen with no organomegaly, non tender, no rebound or guarding Skin. Left base of the neck with surgical wound in place.  Musculoskeletal: no joint deformities     Data Reviewed: I have personally reviewed following labs and imaging studies  CBC: Recent Labs  Lab 11/22/18 0909 11/22/18 1419 11/23/18 0453  WBC 26.6*  --  19.3*  NEUTROABS 23.6*  --   --   HGB 12.1* 13.9 11.1*  HCT 39.1 41.0 34.1*  MCV 93.8  --  89.5  PLT 300  --  176   Basic Metabolic Panel: Recent Labs  Lab 11/22/18 0909 11/22/18 1419 11/22/18 1608 11/22/18 2033 11/23/18 0040 11/23/18 0453  NA 125* 133* 132* 135 135 134*  K 5.5* 4.6 4.1 3.9 3.9 3.8  CL 93*  --  104 107 111 109  CO2 9*  --  16* 19* 17* 17*  GLUCOSE 689* 403* 303* 170* 154* 204*  BUN 20  --  18 18 17 15   CREATININE 1.39*  --  1.04 0.89 0.80 0.78  CALCIUM 9.1  --  8.7* 8.9 8.3* 8.2*   GFR: Estimated Creatinine Clearance: 139.5 mL/min (by C-G formula based on SCr  of 0.78 mg/dL). Liver Function Tests: Recent Labs  Lab 11/22/18 0909  AST 17  ALT 13  ALKPHOS 138*  BILITOT 1.7*  PROT 8.9*  ALBUMIN 3.2*   No results for input(s): LIPASE, AMYLASE in the last 168 hours. No results for input(s): AMMONIA in the last 168 hours. Coagulation Profile: Recent Labs  Lab 11/22/18 0909  INR 1.2   Cardiac Enzymes: Recent Labs  Lab 11/22/18 0909 11/22/18 1608  TROPONINI <0.03 <0.03   BNP (last 3 results) No results for input(s): PROBNP in the last 8760 hours. HbA1C: No results for input(s): HGBA1C in the last 72 hours. CBG: Recent Labs  Lab 11/23/18 0340 11/23/18 0442 11/23/18 0543 11/23/18 0636 11/23/18 0742  GLUCAP 187* 180* 207* 190* 160*   Lipid Profile: No results for input(s): CHOL, HDL, LDLCALC, TRIG, CHOLHDL, LDLDIRECT in the last 72 hours. Thyroid Function Tests:  No results for input(s): TSH, T4TOTAL, FREET4, T3FREE, THYROIDAB in the last 72 hours. Anemia Panel: No results for input(s): VITAMINB12, FOLATE, FERRITIN, TIBC, IRON, RETICCTPCT in the last 72 hours.    Radiology Studies: I have reviewed all of the imaging during this hospital visit personally     Scheduled Meds: . Chlorhexidine Gluconate Cloth  6 each Topical Daily  . enoxaparin (LOVENOX) injection  40 mg Subcutaneous Q24H  . mouth rinse  15 mL Mouth Rinse BID   Continuous Infusions: . dextrose 5 % and 0.45 % NaCl with KCl 20 mEq/L    . insulin 4 Units/hr (11/23/18 0743)  . piperacillin-tazobactam (ZOSYN)  IV Stopped (11/23/18 0543)  . vancomycin Stopped (11/23/18 0035)     LOS: 1 day        Mauricio Gerome Apley, MD

## 2018-11-24 DIAGNOSIS — E669 Obesity, unspecified: Secondary | ICD-10-CM

## 2018-11-24 DIAGNOSIS — R6520 Severe sepsis without septic shock: Secondary | ICD-10-CM

## 2018-11-24 DIAGNOSIS — L0211 Cutaneous abscess of neck: Secondary | ICD-10-CM

## 2018-11-24 DIAGNOSIS — N179 Acute kidney failure, unspecified: Secondary | ICD-10-CM

## 2018-11-24 LAB — GLUCOSE, CAPILLARY
Glucose-Capillary: 146 mg/dL — ABNORMAL HIGH (ref 70–99)
Glucose-Capillary: 197 mg/dL — ABNORMAL HIGH (ref 70–99)
Glucose-Capillary: 206 mg/dL — ABNORMAL HIGH (ref 70–99)
Glucose-Capillary: 211 mg/dL — ABNORMAL HIGH (ref 70–99)

## 2018-11-24 LAB — CBC WITH DIFFERENTIAL/PLATELET
Abs Immature Granulocytes: 0.12 10*3/uL — ABNORMAL HIGH (ref 0.00–0.07)
Basophils Absolute: 0 10*3/uL (ref 0.0–0.1)
Basophils Relative: 0 %
Eosinophils Absolute: 0 10*3/uL (ref 0.0–0.5)
Eosinophils Relative: 0 %
HCT: 34.8 % — ABNORMAL LOW (ref 39.0–52.0)
Hemoglobin: 11.3 g/dL — ABNORMAL LOW (ref 13.0–17.0)
Immature Granulocytes: 1 %
Lymphocytes Relative: 8 %
Lymphs Abs: 1.1 10*3/uL (ref 0.7–4.0)
MCH: 28.9 pg (ref 26.0–34.0)
MCHC: 32.5 g/dL (ref 30.0–36.0)
MCV: 89 fL (ref 80.0–100.0)
Monocytes Absolute: 1 10*3/uL (ref 0.1–1.0)
Monocytes Relative: 7 %
Neutro Abs: 12.4 10*3/uL — ABNORMAL HIGH (ref 1.7–7.7)
Neutrophils Relative %: 84 %
Platelets: 265 10*3/uL (ref 150–400)
RBC: 3.91 MIL/uL — ABNORMAL LOW (ref 4.22–5.81)
RDW: 16.8 % — ABNORMAL HIGH (ref 11.5–15.5)
WBC: 14.6 10*3/uL — ABNORMAL HIGH (ref 4.0–10.5)
nRBC: 0 % (ref 0.0–0.2)

## 2018-11-24 LAB — BASIC METABOLIC PANEL
Anion gap: 7 (ref 5–15)
BUN: 10 mg/dL (ref 6–20)
CO2: 18 mmol/L — ABNORMAL LOW (ref 22–32)
Calcium: 8 mg/dL — ABNORMAL LOW (ref 8.9–10.3)
Chloride: 108 mmol/L (ref 98–111)
Creatinine, Ser: 0.63 mg/dL (ref 0.61–1.24)
GFR calc Af Amer: >60 mL/min (ref >60)
GFR calc non Af Amer: >60 mL/min (ref >60)
Glucose, Bld: 192 mg/dL — ABNORMAL HIGH (ref 70–99)
Potassium: 3.6 mmol/L (ref 3.5–5.1)
Sodium: 133 mmol/L — ABNORMAL LOW (ref 135–145)

## 2018-11-24 MED ORDER — HYDROCODONE-ACETAMINOPHEN 7.5-325 MG PO TABS
1.0000 | ORAL_TABLET | Freq: Four times a day (QID) | ORAL | Status: DC | PRN
  Administered 2018-11-24 – 2018-11-27 (×9): 1 via ORAL
  Filled 2018-11-24 (×9): qty 1

## 2018-11-24 MED ORDER — INSULIN ASPART 100 UNIT/ML ~~LOC~~ SOLN
3.0000 [IU] | Freq: Three times a day (TID) | SUBCUTANEOUS | Status: DC
  Administered 2018-11-24: 3 [IU] via SUBCUTANEOUS

## 2018-11-24 MED ORDER — MORPHINE SULFATE (PF) 2 MG/ML IV SOLN
2.0000 mg | INTRAVENOUS | Status: DC | PRN
  Administered 2018-11-24 – 2018-11-27 (×3): 2 mg via INTRAVENOUS
  Filled 2018-11-24 (×4): qty 1

## 2018-11-24 NOTE — Progress Notes (Signed)
Notified MD on call via text/page in regards to patient's sustained HR 130-140. PRN lopressor 5mg  administered 0035 without effectiveness. PRN dose ordered for HR greater than 130. Patient voices that this is normal to fluctuate HR. Awaiting call back.

## 2018-11-24 NOTE — Progress Notes (Signed)
The patient's forearms are infiltrated, access was established in the right hand, if the patient needs more IV access he may need a midline or central line Kerrie Pleasure made aware

## 2018-11-24 NOTE — Progress Notes (Signed)
Pt reported he could not find his cell phone this morning. After searching through room multiple times, it was not found. Charge nurse made aware as well as Mudlogger. Laundry services have been called, waiting to hear back at this time. The nurse that is receiving the patient in room 1414 is aware of the situation.

## 2018-11-24 NOTE — TOC Initial Note (Signed)
Transition of Care [facility]) - Initial/Assessment Note    Patient Details  Name: Taylor Hughes MRN: [id number] Date of Birth: 1970-11-07  Transition of Care [facility]) CM/SW Contact:    Dessa Phi, RN Phone Number: 11/24/2018, 3:24 PM  Clinical Narrative: Spoke to patient about d/c plans-he plans to d/c home. Provided w/pcp listing-encouraged [place] [facility]-he will make appt. He already gets meds from [facility], has glucometer. Provided w/community resource info-health insurance. Provided w/release of info form for med records,also informed of [place] access for free. Patient voiced understanding.Patient will have own transport home. No further CM needs.                  Expected Discharge Plan: Home/Self Care Barriers to Discharge: No Barriers Identified   Patient Goals and CMS Choice Patient states their goals for this hospitalization and ongoing recovery are:: get better      Expected Discharge Plan and Services Expected Discharge Plan: Home/Self Care   Discharge Planning Services: CM Consult, [facility]   Living arrangements for the past 2 months: Single Family Home Expected Discharge Date: (unknown)                        Prior Living Arrangements/Services Living arrangements for the past 2 months: Single Family Home Lives with:: Spouse Patient language and need for interpreter reviewed:: Yes Do you feel safe going back to the place where you live?: Yes      Need for Family Participation in Patient Care: No (Comment) Care giver support system in place?: Yes (comment)   Criminal Activity/Legal Involvement Pertinent to Current Situation/Hospitalization: No - Comment as needed  Activities of Daily Living Home Assistive Devices/Equipment: Eyeglasses ADL Screening (condition at time of admission) Patient's cognitive ability adequate to safely complete daily activities?: Yes Is the patient deaf or have difficulty hearing?: No Does the patient have difficulty seeing, even  when wearing glasses/contacts?: Yes Does the patient have difficulty concentrating, remembering, or making decisions?: No Patient able to express need for assistance with ADLs?: Yes Does the patient have difficulty dressing or bathing?: No Independently performs ADLs?: Yes (appropriate for developmental age) Does the patient have difficulty walking or climbing stairs?: Yes Weakness of Legs: Left Weakness of Arms/Hands: None  Permission Sought/Granted Permission sought to share information with : Case Manager Permission granted to share information with : Yes, Verbal Permission Granted  Share Information with NAME: Adolphus Birchwood)     Permission granted to share info w Relationship: sister  Permission granted to share info w Contact Information: [phone]  Emotional Assessment Appearance:: Appears stated age, Well-Groomed Attitude/Demeanor/Rapport: Gracious Affect (typically observed): Accepting Orientation: : Oriented to Self, Oriented to Place, Oriented to  Time, Oriented to Situation Alcohol / Substance Use: Never Used    Admission diagnosis:  Shortness of breath [R06.02] Cellulitis of chest wall [L03.313] Diabetic ketoacidosis without coma associated with other specified diabetes mellitus ([place]) [E13.10] Chest pain, unspecified type [R07.9] Patient Active Problem List   Diagnosis Date Noted  . Sepsis ([place]) 11/23/2018  . Neck abscess 11/23/2018  . Essential hypertension 11/23/2018  . AKI (acute kidney injury) ([place]) 11/23/2018  . Obesity (BMI 30.0-34.9) 11/23/2018  . Type 2 diabetes mellitus with other specified complication ([place]) 82/95/6213  . Sepsis due to group B Streptococcus ([place])   . Diabetic infection of left foot ([place])   . DKA (diabetic ketoacidoses) ([place]) 07/30/2018  . Left foot infection   . DKA, type 2 ([place]) 07/28/2018  .  Leukocytosis 07/28/2018  . Hyperkalemia 07/28/2018  . Hyponatremia 07/28/2018   PCP:  Jilda Panda, MD Pharmacy:   [facility] [address], [place] [address] Phone: [phone] Fax: [phone]  [place]), [place] - [address] [place]) [place] [address] Phone: [phone] Fax: [place], [place] [address] [place] [address] Phone: [phone] Fax: [place], [place] - [address] [place] [address] Phone: [phone] Fax: [phone]     Social Determinants of Health (SDOH) Interventions    Readmission Risk Interventions No flowsheet data found.

## 2018-11-24 NOTE — Progress Notes (Signed)
   Subjective:    Patient ID: Taylor Hughes, male    DOB: [birth year], 48 y.o.   MRN: [id number]  HPI    Review of Systems     Objective:   Physical Exam        Assessment & Plan:  Changed wound packing.

## 2018-11-24 NOTE — Progress Notes (Signed)
Inpatient Diabetes Program Recommendations  AACE/ADA: New Consensus Statement on Inpatient Glycemic Control (2015)  Target Ranges:  Prepandial:   less than 140 mg/dL      Peak postprandial:   less than 180 mg/dL (1-2 hours)      Critically ill patients:  140 - 180 mg/dL   Lab Results  Component Value Date   GLUCAP 211 (H) 11/24/2018   HGBA1C 9.6 (H) 07/28/2018    Review of Glycemic Control  Diabetes history: DM2 Outpatient Diabetes medications: Lantus 12 units QD, Novolog 4 units tidwc Current orders for Inpatient glycemic control: Levemir 20 units bid, Novolog 0-9 units tidwc  Needs updated HgbA1C. Blood sugars 146-211 mg/dL.  Inpatient Diabetes Program Recommendations:     Add Novolog 3 units tidwc for meal coverage insulin. Add HS correction. Please update HgbA1C to assess glycemic control PTA  Will call and speak with pt about how he's getting his insulin.  Thank you. Lorenda Peck, RD, LDN, CDE Inpatient Diabetes Coordinator [phone]

## 2018-11-24 NOTE — Progress Notes (Signed)
PROGRESS NOTE    Taylor Hughes  NIO:[id number] DOB: 07/18/1971 DOA: 11/22/2018 PCP: Jilda Panda, MD    Brief Narrative:   48 year old male presented with pain in the left side of his neck.  He does have significant past medical history for type 2 diabetes mellitus and left diabetic foot.  Apparently he sustained a musculoskeletal injury while doing gardening 48 hours prior to hospitalization, left-sided chest pain that progressed into left neck pain with sensation of fullness in his left neck area/sternal area.  Reported not using his insulin for about 72 hours.  On physical examination his blood pressure was 176/98, heart rate 117, respiratory rate 31, oxygen saturation 100%, he had dry mucous membranes, fullness in his left lower neck area, positive tenderness and local erythema, his lungs were clear to auscultation bilaterally, heart S1-S2 present and rhythmic, the abdomen was soft nontender, no lower extremity edema.  Venous pH is 7.18, NA 125, potassium 5.5, chloride 93, bicarb 9, glucose 689, BUN 20, creatinine 1.39, anion gap 23, white count 26.6, hemoglobin 12.1, hematocrit 39.1, platelets 300, urinalysis with glucose greater than 500, 0-5 white cells, 0-5 red cells, specific gravity 1.023.  Neck CT with phlegmonous inflammatory changes at the thoracic inlet anteriorly, more to the left, measure about 6 cm in diameter with a few tiny air bubbles.  Asymmetric enlargement of the left pectoralis muscle suggesting myositis.  Patient was admitted to the hospital with a working diagnosis left neck abscess/ pectoralis muscle myositis complicated by diabetes ketoacidosis and acute kidney injury.   Assessment & Plan:   Principal Problem:   Sepsis ([place]) Active Problems:   DKA (diabetic ketoacidoses) ([place])   Neck abscess   Essential hypertension   AKI (acute kidney injury) ([place])   Obesity (BMI 30.0-34.9)   Type 2 diabetes mellitus with other specified complication ([place])  Diabetes ketoacidosis:  Resolved Patient presenting with elevated glucose of 689 with anion gap of 23.  Patient was started on IV insulin DKA protocol and now transitioned to subcutaneous insulin. --Continue consistent carbohydrate diet --Levemir 20 units  BID --Insulin sliding scale for coverage --Glucose much better control, continue to monitor and adjust accordingly  Left neck abscess complicated with sepsis (present on admission). Patient underwent source control with neck incision and drainage by ENT on 11/22/2018.  Vancomycin and Zosyn has been de-escalated to Unasyn.  MRSA PCR negative.  Leukocytosis continues to improve, WBC down to 14.6 from 26 on admission.  Afebrile. --Wound culture notable for moderate growth of staph aureus --Blood cultures x2 no growth to date --ENT continue to follow, dressing changes twice daily --Continue Unasyn --Transition pain control from Dilaudid PCA to Norco prn --Continue to await further recommendations from ENT, Dr. Redmond Baseman.  AKI: Resolved Creatinine elevated to 1.39 on admission.  With improvement of DKA and IV fluid hydration; creatinine has now improved down to 0.63.  HTN with reactive tachycardia.  --Continue metoprolol 25 mg p.o. twice daily --Alprazolam as needed for anxiety --Transition pain control from Dilaudid PCA to Norco --Continue to monitor on telemetry  Obesity: Calculated BMI is 30.9.  Discussed with patient needs for weight loss measures as this complicates all facets of care.  DVT prophylaxis: enoxaparin   Code Status:  full Family Communication: no family at the bedside  Disposition Plan/ discharge barriers: pending clinical improvement.   Body mass index is 30.96 kg/m.     Consultants:   ENT,  Melida Quitter, MD  Procedures:   Left neck abscess I&D on 11/22/2018  Antimicrobials:  Vancomycin 4/13 - 4/14  Zosyn  4/13 - 4/14  Unasyn 4/14>>>   Subjective: Patient seen and examined at bedside, resting comfortably.  Requesting  breakfast.  States pain is improved.  Has not used many doses of his Dilaudid PCA.  Continues on IV Unasyn; ENT accomplishing wound packing twice daily.  No other complaints at this time.  Denies headache, no chest pain, no palpitations, no fever/chills/night sweats, no nausea/vomiting/diarrhea, no shortness of breath, no abdominal pain.  No acute events overnight per nursing staff.  Ready for transfer to floor.  Patient sp dressing changes, positive pain the surgical wound, positive anxiety. No nausea or vomiting, no abdominal pain, no dyspnea or chest pain. Positive hunger this am.   Objective: Vitals:   11/24/18 0557 11/24/18 0600 11/24/18 0700 11/24/18 0800  BP:  (!) 145/93 (!) 156/96   Pulse:  97 99   Resp: (!) 21 18 (!) 23   Temp:    98.5 F (36.9 C)  TempSrc:    Axillary  SpO2: 99% 100% 100%   Weight:      Height:        Intake/Output Summary (Last 24 hours) at 11/24/2018 1011 Last data filed at 11/24/2018 0526 Gross per 24 hour  Intake 521.12 ml  Output 1000 ml  Net -478.88 ml   Filed Weights   11/22/18 0850 11/22/18 1730  Weight: 100.7 kg 100.7 kg    Examination:   General: NAD, AAOx4 Neurology: Awake and alert, non focal  E ENT: no pallor, no icterus, oral mucosa moist, dressing noted in place c/d/i Cardiovascular: No JVD. S1-S2 present, rhythmic, tachycardic, no gallops, rubs, or murmurs. No lower extremity edema. Pulmonary:  positive breath sounds bilaterally, adequate air movement, no wheezing, rhonchi or rales. Gastrointestinal. Abdomen with no organomegaly, non tender, no rebound or guarding Skin. Left base of the neck with dressing in place; c/d/i. Musculoskeletal: no joint deformities     Data Reviewed: I have personally reviewed following labs and imaging studies  CBC: Recent Labs  Lab 11/22/18 0909 11/22/18 1419 11/23/18 0453 11/24/18 0420  WBC 26.6*  --  19.3* 14.6*  NEUTROABS 23.6*  --   --  12.4*  HGB 12.1* 13.9 11.1* 11.3*  HCT 39.1 41.0  34.1* 34.8*  MCV 93.8  --  89.5 89.0  PLT 300  --  271 638   Basic Metabolic Panel: Recent Labs  Lab 11/23/18 0040 11/23/18 0453 11/23/18 0848 11/23/18 1550 11/24/18 0420  NA 135 134* 133* 132* 133*  K 3.9 3.8 3.7 3.5 3.6  CL 111 109 109 105 108  CO2 17* 17* 18* 19* 18*  GLUCOSE 154* 204* 146* 202* 192*  BUN 17 15 14 12 10   CREATININE 0.80 0.78 0.75 0.74 0.63  CALCIUM 8.3* 8.2* 8.1* 7.9* 8.0*   GFR: Estimated Creatinine Clearance: 139.5 mL/min (by C-G formula based on SCr of 0.63 mg/dL). Liver Function Tests: Recent Labs  Lab 11/22/18 0909  AST 17  ALT 13  ALKPHOS 138*  BILITOT 1.7*  PROT 8.9*  ALBUMIN 3.2*   No results for input(s): LIPASE, AMYLASE in the last 168 hours. No results for input(s): AMMONIA in the last 168 hours. Coagulation Profile: Recent Labs  Lab 11/22/18 0909  INR 1.2   Cardiac Enzymes: Recent Labs  Lab 11/22/18 0909 11/22/18 1608  TROPONINI <0.03 <0.03   BNP (last 3 results) No results for input(s): PROBNP in the last 8760 hours. HbA1C: No results for input(s): HGBA1C in the last 72 hours. CBG:  Recent Labs  Lab 11/23/18 1213 11/23/18 1656 11/23/18 2100 11/24/18 0010 11/24/18 0743  GLUCAP 156* 238* 175* 146* 211*   Lipid Profile: No results for input(s): CHOL, HDL, LDLCALC, TRIG, CHOLHDL, LDLDIRECT in the last 72 hours. Thyroid Function Tests: No results for input(s): TSH, T4TOTAL, FREET4, T3FREE, THYROIDAB in the last 72 hours. Anemia Panel: No results for input(s): VITAMINB12, FOLATE, FERRITIN, TIBC, IRON, RETICCTPCT in the last 72 hours.    Radiology Studies: I have reviewed all of the imaging during this hospital visit personally     Scheduled Meds: . Chlorhexidine Gluconate Cloth  6 each Topical Daily  . enoxaparin (LOVENOX) injection  40 mg Subcutaneous Q24H  . ibuprofen  400 mg Oral TID  . insulin aspart  0-9 Units Subcutaneous TID WC  . insulin detemir  20 Units Subcutaneous BID  . mouth rinse  15 mL Mouth  Rinse BID  . metoprolol tartrate  25 mg Oral BID  . pantoprazole  40 mg Oral Daily   Continuous Infusions: . sodium chloride 75 mL/hr at 11/24/18 0001  . ampicillin-sulbactam (UNASYN) IV Stopped (11/24/18 0552)     LOS: 2 days        Eric J [place] DO

## 2018-11-24 NOTE — Progress Notes (Signed)
   Subjective:    Patient ID: Taylor Hughes, male    DOB: 10-24-1970, 48 y.o.   MRN: [id number]  HPI Continues to be doing well.  Review of Systems     Objective:   Physical Exam Tm 100.9, WBC 14.6, Glucose 192 Alert, NAD Neck wound packed, changed packing with copious purulent fluid    Assessment & Plan:  Neck abscess/necrotizing fasciitis s/p I&D, DKA  Changed wound packing, will continue twice daily.  Staph aureus on culture.  Continue IV antibiotics, tight diabetes management.  Transfer to floor planned today.

## 2018-11-24 NOTE — Progress Notes (Signed)
Patient had diaphoresis during the night that resulted in extra changes of gown and linen x2. Patient not hypoglycemic. Low grade temp noted throughout the night. RA with normal Sat. Denies shortness of breath and/or cough. Pain more effectively managed while on PCA.

## 2018-11-25 LAB — CBC
HCT: 36.5 % — ABNORMAL LOW (ref 39.0–52.0)
Hemoglobin: 11.7 g/dL — ABNORMAL LOW (ref 13.0–17.0)
MCH: 28.5 pg (ref 26.0–34.0)
MCHC: 32.1 g/dL (ref 30.0–36.0)
MCV: 89 fL (ref 80.0–100.0)
Platelets: 285 10*3/uL (ref 150–400)
RBC: 4.1 MIL/uL — ABNORMAL LOW (ref 4.22–5.81)
RDW: 16.7 % — ABNORMAL HIGH (ref 11.5–15.5)
WBC: 12.6 10*3/uL — ABNORMAL HIGH (ref 4.0–10.5)
nRBC: 0 % (ref 0.0–0.2)

## 2018-11-25 LAB — BASIC METABOLIC PANEL
Anion gap: 10 (ref 5–15)
BUN: 9 mg/dL (ref 6–20)
CO2: 20 mmol/L — ABNORMAL LOW (ref 22–32)
Calcium: 8 mg/dL — ABNORMAL LOW (ref 8.9–10.3)
Chloride: 105 mmol/L (ref 98–111)
Creatinine, Ser: 0.65 mg/dL (ref 0.61–1.24)
GFR calc Af Amer: >60 mL/min (ref >60)
GFR calc non Af Amer: >60 mL/min (ref >60)
Glucose, Bld: 191 mg/dL — ABNORMAL HIGH (ref 70–99)
Potassium: 3.4 mmol/L — ABNORMAL LOW (ref 3.5–5.1)
Sodium: 135 mmol/L (ref 135–145)

## 2018-11-25 LAB — GLUCOSE, CAPILLARY
Glucose-Capillary: 196 mg/dL — ABNORMAL HIGH (ref 70–99)
Glucose-Capillary: 217 mg/dL — ABNORMAL HIGH (ref 70–99)
Glucose-Capillary: 138 mg/dL — ABNORMAL HIGH (ref 70–99)
Glucose-Capillary: 87 mg/dL (ref 70–99)
Glucose-Capillary: 73 mg/dL (ref 70–99)
Glucose-Capillary: 86 mg/dL (ref 70–99)

## 2018-11-25 LAB — HEMOGLOBIN A1C
Hgb A1c MFr Bld: 11.4 % — ABNORMAL HIGH (ref 4.8–5.6)
Mean Plasma Glucose: 280.48 mg/dL

## 2018-11-25 LAB — MAGNESIUM: Magnesium: 1.9 mg/dL (ref 1.7–2.4)

## 2018-11-25 MED ORDER — DILTIAZEM HCL 25 MG/5ML IV SOLN
10.0000 mg | Freq: Once | INTRAVENOUS | Status: AC
  Administered 2018-11-25: 23:00:00 10 mg via INTRAVENOUS
  Filled 2018-11-25: qty 5

## 2018-11-25 MED ORDER — POTASSIUM CHLORIDE CRYS ER 20 MEQ PO TBCR
40.0000 meq | EXTENDED_RELEASE_TABLET | Freq: Once | ORAL | Status: AC
  Administered 2018-11-25: 40 meq via ORAL
  Filled 2018-11-25: qty 2

## 2018-11-25 MED ORDER — INSULIN DETEMIR 100 UNIT/ML ~~LOC~~ SOLN
22.0000 [IU] | Freq: Two times a day (BID) | SUBCUTANEOUS | Status: DC
  Administered 2018-11-25 – 2018-11-26 (×4): 22 [IU] via SUBCUTANEOUS
  Filled 2018-11-25 (×5): qty 0.22

## 2018-11-25 MED ORDER — FUROSEMIDE 10 MG/ML IJ SOLN: 20.0000 mg | Freq: Once | INTRAMUSCULAR | Status: DC

## 2018-11-25 MED ORDER — INSULIN ASPART 100 UNIT/ML ~~LOC~~ SOLN
5.0000 [IU] | Freq: Three times a day (TID) | SUBCUTANEOUS | Status: DC
  Administered 2018-11-25 – 2018-11-26 (×5): 5 [IU] via SUBCUTANEOUS

## 2018-11-25 NOTE — Progress Notes (Signed)
PROGRESS NOTE    Taylor Hughes  JIR:[id number] DOB: 10-18-1970 DOA: 11/22/2018 PCP: Jilda Panda, MD    Brief Narrative:   48 year old male presented with pain in the left side of his neck.  He does have significant past medical history for type 2 diabetes mellitus and left diabetic foot.  Apparently he sustained a musculoskeletal injury while doing gardening 48 hours prior to hospitalization, left-sided chest pain that progressed into left neck pain with sensation of fullness in his left neck area/sternal area.  Reported not using his insulin for about 72 hours.  On physical examination his blood pressure was 176/98, heart rate 117, respiratory rate 31, oxygen saturation 100%, he had dry mucous membranes, fullness in his left lower neck area, positive tenderness and local erythema, his lungs were clear to auscultation bilaterally, heart S1-S2 present and rhythmic, the abdomen was soft nontender, no lower extremity edema.  Venous pH is 7.18, NA 125, potassium 5.5, chloride 93, bicarb 9, glucose 689, BUN 20, creatinine 1.39, anion gap 23, white count 26.6, hemoglobin 12.1, hematocrit 39.1, platelets 300, urinalysis with glucose greater than 500, 0-5 white cells, 0-5 red cells, specific gravity 1.023.  Neck CT with phlegmonous inflammatory changes at the thoracic inlet anteriorly, more to the left, measure about 6 cm in diameter with a few tiny air bubbles.  Asymmetric enlargement of the left pectoralis muscle suggesting myositis.  Patient was admitted to the hospital with a working diagnosis left neck abscess/ pectoralis muscle myositis complicated by diabetes ketoacidosis and acute kidney injury.   Assessment & Plan:   Principal Problem:   Sepsis ([place]) Active Problems:   DKA (diabetic ketoacidoses) ([place])   Neck abscess   Essential hypertension   AKI (acute kidney injury) ([place])   Obesity (BMI 30.0-34.9)   Type 2 diabetes mellitus with other specified complication ([place])  Diabetes ketoacidosis:  Resolved Patient presenting with elevated glucose of 689 with anion gap of 23.  Patient was started on IV insulin DKA protocol and now transitioned to subcutaneous insulin. --Continue consistent carbohydrate diet --Hemoglobin A1c 11.4; indicates very poor control outpatient --Levemir increased from 20u to 22u [place] BID --Novolog 5u [place] TID w/ meals --Insulin sliding scale for coverage --Glucose much better controlled, continue to monitor and adjust accordingly --Diabetic educator following, appreciate assistance --Nutrition consulted per patient request for diabetic diet teaching  Left neck abscess complicated with sepsis (present on admission). Patient underwent source control with neck incision and drainage by ENT on 11/22/2018.  Vancomycin and Zosyn has been de-escalated to Unasyn.  MRSA PCR negative.  Leukocytosis continues to improve, WBC down to 12.6 from 26 on admission.  Afebrile. --Wound culture notable for moderate growth of staph aureus --Blood cultures x2 no growth to date --ENT continue to follow, dressing changes twice daily --Continue Unasyn --Pain control with Norco prn --Continue to await further recommendations from ENT, Dr. Redmond Baseman.  AKI: Resolved Creatinine elevated to 1.39 on admission.  With improvement of DKA and IV fluid hydration; creatinine has now improved down to 0.65.  Hypokalemia Potassium 3.4 today, magnesium normal at 1.9. --We will replete potassium today --Repeat BMP in the a.m. with magnesium  HTN with reactive tachycardia.  --Continue metoprolol 25 mg p.o. twice daily --Alprazolam as needed for anxiety --Continue to monitor on telemetry  Obesity: Calculated BMI is 30.9.  Discussed with patient needs for weight loss measures as this complicates all facets of care.  DVT prophylaxis: enoxaparin   Code Status:  full Family Communication: None Disposition Plan/ discharge barriers:  pending clinical improvement.   Body mass index is 30.96 kg/m.      Consultants:   ENT,  Melida Quitter, MD  Procedures:   Left neck abscess I&D on 11/22/2018  Antimicrobials:   Vancomycin 4/13 - 4/14  Zosyn  4/13 - 4/14  Unasyn 4/14>>>   Subjective: Patient seen and examined at bedside, resting comfortably.  Feeling better each day.  Tolerating dressing changes with oral pain medication as needed.  Requesting to see nutritionist this morning for assistance with diet recommendations especially when he returns home. Continues on IV Unasyn; ENT accomplishing wound packing twice daily.  No other complaints at this time.  Denies headache, no chest pain, no palpitations, no fever/chills/night sweats, no nausea/vomiting/diarrhea, no shortness of breath, no abdominal pain.  No acute events overnight per nursing staff.    Objective: Vitals:   11/24/18 1853 11/24/18 2113 11/24/18 2300 11/25/18 0531  BP:  113/74  118/74  Pulse: (!) 127 (!) 138 94 89  Resp:  18  18  Temp:  99.5 F (37.5 C)  98.8 F (37.1 C)  TempSrc:  Oral  Oral  SpO2:  99%  100%  Weight:      Height:        Intake/Output Summary (Last 24 hours) at 11/25/2018 1035 Last data filed at 11/25/2018 0940 Gross per 24 hour  Intake 1355.8 ml  Output 650 ml  Net 705.8 ml   Filed Weights   11/22/18 0850 11/22/18 1730  Weight: 100.7 kg 100.7 kg    Examination:   General: NAD, AAOx4 Neurology: Awake and alert, non focal  E ENT: no pallor, no icterus, oral mucosa moist, dressing noted in place c/d/i Cardiovascular: No JVD. S1-S2 present, rhythmic, tachycardic, no gallops, rubs, or murmurs. No lower extremity edema. Pulmonary:  positive breath sounds bilaterally, adequate air movement, no wheezing, rhonchi or rales. Gastrointestinal. Abdomen with no organomegaly, non tender, no rebound or guarding Skin. Left base of the neck with dressing in place; c/d/i. Musculoskeletal: no joint deformities     Data Reviewed: I have personally reviewed following labs and imaging studies  CBC:  Recent Labs  Lab 11/22/18 0909 11/22/18 1419 11/23/18 0453 11/24/18 0420 11/25/18 0525  WBC 26.6*  --  19.3* 14.6* 12.6*  NEUTROABS 23.6*  --   --  12.4*  --   HGB 12.1* 13.9 11.1* 11.3* 11.7*  HCT 39.1 41.0 34.1* 34.8* 36.5*  MCV 93.8  --  89.5 89.0 89.0  PLT 300  --  271 265 761   Basic Metabolic Panel: Recent Labs  Lab 11/23/18 0453 11/23/18 0848 11/23/18 1550 11/24/18 0420 11/25/18 0525  NA 134* 133* 132* 133* 135  K 3.8 3.7 3.5 3.6 3.4*  CL 109 109 105 108 105  CO2 17* 18* 19* 18* 20*  GLUCOSE 204* 146* 202* 192* 191*  BUN 15 14 12 10 9   CREATININE 0.78 0.75 0.74 0.63 0.65  CALCIUM 8.2* 8.1* 7.9* 8.0* 8.0*  MG  --   --   --   --  1.9   GFR: Estimated Creatinine Clearance: 139.5 mL/min (by C-G formula based on SCr of 0.65 mg/dL). Liver Function Tests: Recent Labs  Lab 11/22/18 0909  AST 17  ALT 13  ALKPHOS 138*  BILITOT 1.7*  PROT 8.9*  ALBUMIN 3.2*   No results for input(s): LIPASE, AMYLASE in the last 168 hours. No results for input(s): AMMONIA in the last 168 hours. Coagulation Profile: Recent Labs  Lab 11/22/18 0909  INR 1.2  Cardiac Enzymes: Recent Labs  Lab 11/22/18 0909 11/22/18 1608  TROPONINI <0.03 <0.03   BNP (last 3 results) No results for input(s): PROBNP in the last 8760 hours. HbA1C: Recent Labs    11/25/18 0525  HGBA1C 11.4*   CBG: Recent Labs  Lab 11/24/18 0010 11/24/18 0743 11/24/18 1220 11/24/18 1700 11/25/18 0745  GLUCAP 146* 211* 206* 197* 217*   Lipid Profile: No results for input(s): CHOL, HDL, LDLCALC, TRIG, CHOLHDL, LDLDIRECT in the last 72 hours. Thyroid Function Tests: No results for input(s): TSH, T4TOTAL, FREET4, T3FREE, THYROIDAB in the last 72 hours. Anemia Panel: No results for input(s): VITAMINB12, FOLATE, FERRITIN, TIBC, IRON, RETICCTPCT in the last 72 hours.    Radiology Studies: I have reviewed all of the imaging during this hospital visit personally     Scheduled Meds: . enoxaparin  (LOVENOX) injection  40 mg Subcutaneous Q24H  . ibuprofen  400 mg Oral TID  . insulin aspart  0-9 Units Subcutaneous TID WC  . insulin aspart  5 Units Subcutaneous TID WC  . insulin detemir  22 Units Subcutaneous BID  . mouth rinse  15 mL Mouth Rinse BID  . metoprolol tartrate  25 mg Oral BID  . pantoprazole  40 mg Oral Daily   Continuous Infusions: . sodium chloride 75 mL/hr at 11/25/18 0946  . ampicillin-sulbactam (UNASYN) IV 3 g (11/25/18 0530)     LOS: 3 days        Loni Delbridge J [place] DO

## 2018-11-25 NOTE — Progress Notes (Signed)
Having a good morning.  No specific complaints.  Tm 99.5 VSS, WBC 12.6, Glucose 191 Alert, NAD Neck with packing in place, changed packing  A/P: Neck abscess/necrotizing fasciitis, DKA  Changed dressing, will continue twice daily until wound heals.  Markers of infection improving.  Continue IV antibiotics, tight diabetes management.

## 2018-11-25 NOTE — Progress Notes (Signed)
Changed packing material.

## 2018-11-26 LAB — BASIC METABOLIC PANEL
Anion gap: 9 (ref 5–15)
BUN: 7 mg/dL (ref 6–20)
CO2: 23 mmol/L (ref 22–32)
Calcium: 8.2 mg/dL — ABNORMAL LOW (ref 8.9–10.3)
Chloride: 105 mmol/L (ref 98–111)
Creatinine, Ser: 0.71 mg/dL (ref 0.61–1.24)
GFR calc Af Amer: >60 mL/min (ref >60)
GFR calc non Af Amer: >60 mL/min (ref >60)
Glucose, Bld: 115 mg/dL — ABNORMAL HIGH (ref 70–99)
Potassium: 3.2 mmol/L — ABNORMAL LOW (ref 3.5–5.1)
Sodium: 137 mmol/L (ref 135–145)

## 2018-11-26 LAB — MAGNESIUM: Magnesium: 1.8 mg/dL (ref 1.7–2.4)

## 2018-11-26 LAB — GLUCOSE, CAPILLARY
Glucose-Capillary: 108 mg/dL — ABNORMAL HIGH (ref 70–99)
Glucose-Capillary: 108 mg/dL — ABNORMAL HIGH (ref 70–99)
Glucose-Capillary: 105 mg/dL — ABNORMAL HIGH (ref 70–99)
Glucose-Capillary: 29 mg/dL — CL (ref 70–99)
Glucose-Capillary: 97 mg/dL (ref 70–99)
Glucose-Capillary: 165 mg/dL — ABNORMAL HIGH (ref 70–99)

## 2018-11-26 LAB — CBC
HCT: 35.8 % — ABNORMAL LOW (ref 39.0–52.0)
Hemoglobin: 12 g/dL — ABNORMAL LOW (ref 13.0–17.0)
MCH: 29 pg (ref 26.0–34.0)
MCHC: 33.5 g/dL (ref 30.0–36.0)
MCV: 86.5 fL (ref 80.0–100.0)
Platelets: 356 10*3/uL (ref 150–400)
RBC: 4.14 MIL/uL — ABNORMAL LOW (ref 4.22–5.81)
RDW: 16.5 % — ABNORMAL HIGH (ref 11.5–15.5)
WBC: 13.6 10*3/uL — ABNORMAL HIGH (ref 4.0–10.5)
nRBC: 0 % (ref 0.0–0.2)

## 2018-11-26 MED ORDER — METOPROLOL TARTRATE 50 MG PO TABS
50.0000 mg | ORAL_TABLET | Freq: Two times a day (BID) | ORAL | Status: DC
  Administered 2018-11-26 – 2018-11-29 (×7): 50 mg via ORAL
  Filled 2018-11-26 (×7): qty 1

## 2018-11-26 MED FILL — !NOVOLOG 100UNITS/ML VIAL: 100/ML | 28 days supply | Qty: 10 | Fill #2

## 2018-11-26 NOTE — TOC Transition Note (Signed)
Transition of Care [facility]) - CM/SW Discharge Note   Patient Details  Name: Taylor Hughes MRN: [id number] Date of Birth: 04/19/1971  [facility] CM/SW Contact:  Dessa Phi, RN Phone Number: 11/26/2018, 1:10 PM   Clinical Narrative: Patient with no health insurance,difficulty affording meds-TC [place] pharmacy-spoke to Carlos-confirmed Rison able to provide 1 time refill on meds-patient can go to [facility] pharmacy @ d/c for med asst-currently for d/c on novolog/lantus,glucose meter kit. Patient will make appt w/[facility] clinics. Continue to follow.      Final next level of care: Home/Self Care Barriers to Discharge: No Barriers Identified   Patient Goals and CMS Choice Patient states their goals for this hospitalization and ongoing recovery are:: get better      Discharge Placement                       Discharge Plan and Services   Discharge Planning Services: Medication Assistance                      Social Determinants of Health (SDOH) Interventions     Readmission Risk Interventions No flowsheet data found.

## 2018-11-26 NOTE — Progress Notes (Signed)
Review for echo order Patient had normal echo in December 2019 (LVEF 65 to 70%) With current clinical course and given current COVID pandemic will cancel current echo order  Please call with questions  Dorris Carnes, MD, [facility]

## 2018-11-26 NOTE — Progress Notes (Addendum)
NT brought pt container of orange juice which RN witnessed him drink in full after BS reading of 29.    RN also witnessed patient eat container of apple sauce after the orange juice.

## 2018-11-26 NOTE — Progress Notes (Signed)
After MD placed gauze on surgical neck site, RN applied three layers of gauze and ABD binder to completely cover and secure surgical site.

## 2018-11-26 NOTE — Progress Notes (Signed)
   Subjective:    Patient ID: Taylor Hughes, male    DOB: 02/20/1971, 48 y.o.   MRN: [id number]  HPI    Review of Systems     Objective:   Physical Exam        Assessment & Plan:  Changed wound packing.

## 2018-11-26 NOTE — Progress Notes (Addendum)
   Subjective:    Patient ID: Jaclyn Carew, male    DOB: 10/13/1970, 48 y.o.   MRN: [id number]  HPI  Feeling good.  No complaints.  Review of Systems     Objective:   Physical Exam Tm 99.8.  WBC 13.6, glucose 115 Alert, NAD Neck with packing in place, changed packing still with purulent fluid Upper anterior chest wall with mild tenderness, no redness or edema    Assessment & Plan:  Neck abscess/necrotizing fasciitis, DKA  Continues to progress well.  Continue twice daily packing changes, not ready to turn this over to nursing.  Continue IV antibiotics.  Culture with staph aureus, sensitivity pending.  Tight diabetic control.  If WBC and/or temperature trend the wrong direction, will need to repeat neck and chest CT.  Not needed at this point.  Discussed plan with hospitalist.

## 2018-11-26 NOTE — Progress Notes (Signed)
PROGRESS NOTE    Taylor Hughes  ZOX:[id number] DOB: 06-26-1971 DOA: 11/22/2018 PCP: Jilda Panda, MD    Brief Narrative:   48 year old male presented with pain in the left side of his neck.  He does have significant past medical history for type 2 diabetes mellitus and left diabetic foot.  Apparently he sustained a musculoskeletal injury while doing gardening 48 hours prior to hospitalization, left-sided chest pain that progressed into left neck pain with sensation of fullness in his left neck area/sternal area.  Reported not using his insulin for about 72 hours.  On physical examination his blood pressure was 176/98, heart rate 117, respiratory rate 31, oxygen saturation 100%, he had dry mucous membranes, fullness in his left lower neck area, positive tenderness and local erythema, his lungs were clear to auscultation bilaterally, heart S1-S2 present and rhythmic, the abdomen was soft nontender, no lower extremity edema.  Venous pH is 7.18, NA 125, potassium 5.5, chloride 93, bicarb 9, glucose 689, BUN 20, creatinine 1.39, anion gap 23, white count 26.6, hemoglobin 12.1, hematocrit 39.1, platelets 300, urinalysis with glucose greater than 500, 0-5 white cells, 0-5 red cells, specific gravity 1.023.  Neck CT with phlegmonous inflammatory changes at the thoracic inlet anteriorly, more to the left, measure about 6 cm in diameter with a few tiny air bubbles.  Asymmetric enlargement of the left pectoralis muscle suggesting myositis.  Patient was admitted to the hospital with a working diagnosis left neck abscess/ pectoralis muscle myositis complicated by diabetes ketoacidosis and acute kidney injury.   Assessment & Plan:   Principal Problem:   Sepsis ([place]) Active Problems:   DKA (diabetic ketoacidoses) ([place])   Neck abscess   Essential hypertension   AKI (acute kidney injury) ([place])   Obesity (BMI 30.0-34.9)   Type 2 diabetes mellitus with other specified complication ([place])  Diabetes ketoacidosis:  Resolved Patient presenting with elevated glucose of 689 with anion gap of 23.  Patient was started on IV insulin DKA protocol and now transitioned to subcutaneous insulin. --Continue consistent carbohydrate diet --Hemoglobin A1c 11.4; indicates very poor control outpatient --Levemir increased from 20u to 22u [place] BID --Novolog 5u  TID w/ meals --Insulin sliding scale for coverage --Glucose much better controlled, continue to monitor and adjust accordingly --Diabetic educator following, appreciate assistance --Nutrition consulted per patient request for diabetic diet teaching  Left neck abscess complicated with sepsis (present on admission). Patient underwent source control with neck incision and drainage by ENT on 11/22/2018.  Vancomycin and Zosyn has been de-escalated to Unasyn.  MRSA PCR negative.  Leukocytosis continues to improve. --WBC 26....-->12.6-->13.6 today --TMAX 99.8 past 24hrs --Wound culture notable for moderate growth of staph aureus --Blood cultures x2 no growth to date --ENT continue to follow, dressing changes twice daily --Continue Unasyn --Pain control with Norco prn --continue inpatient per Dr. Redmond Baseman through the weekend, if WBC count/fever curve increases; will need to obtain CT Chest/neck with IV contrast for further evaluation of neck abscess/Nec fasciitis.   AKI: Resolved Creatinine elevated to 1.39 on admission.  With improvement of DKA and IV fluid hydration; creatinine has now improved down to 0.65.  Hypokalemia Potassium 3.2 today, magnesium normal at 1.8. --We will replete potassium today --Repeat BMP in the a.m. with magnesium  HTN Atrial fibrillation with RVR --CHA2DS2-VASC = 1 (DM2) --Increase metoprolol from 25 mg to 50mg  p.o. twice daily --Alprazolam as needed for anxiety --Continue to monitor on telemetry --check echo --continue to monitor on telemetry  Obesity: Calculated BMI is 30.9.  Discussed with patient needs for weight loss measures as  this complicates all facets of care.  DVT prophylaxis: enoxaparin   Code Status:  full Family Communication: None Disposition Plan/ discharge barriers: pending clinical improvement.   Body mass index is 30.96 kg/m.     Consultants:   ENT,  Melida Quitter, MD  Procedures:   Left neck abscess I&D on 11/22/2018  Antimicrobials:   Vancomycin 4/13 - 4/14  Zosyn  4/13 - 4/14  Unasyn 4/14>>>   Subjective: Patient seen and examined at bedside, resting comfortably.  Feeling better each day.  Tolerating dressing changes with oral pain medication as needed. Continues on IV Unasyn; ENT accomplishing wound packing twice daily.  Discussed with Dr. Adonis Housekeeper over the telephone today, if patient's white blood cell count and fever curve continue to increase will need repeat CT chest/neck to evaluate his wound for progression.  Patient with no other complaints at this time.  Denies headache, no chest pain, no palpitations, no fever/chills/night sweats, no nausea/vomiting/diarrhea, no shortness of breath, no abdominal pain.  No acute events overnight per nursing staff.    Objective: Vitals:   11/25/18 1427 11/25/18 2049 11/25/18 2307 11/26/18 0505  BP: 110/71 110/69 113/64 105/65  Pulse: (!) 102 (!) 140 95 83  Resp: 18 18 18 18   Temp: 99.8 F (37.7 C) 99.6 F (37.6 C) 99.6 F (37.6 C) 97.8 F (36.6 C)  TempSrc: Oral Oral Oral Oral  SpO2: 98% 100% 99% 100%  Weight:      Height:        Intake/Output Summary (Last 24 hours) at 11/26/2018 1104 Last data filed at 11/26/2018 0600 Gross per 24 hour  Intake 400 ml  Output -  Net 400 ml   Filed Weights   11/22/18 0850 11/22/18 1730  Weight: 100.7 kg 100.7 kg    Examination:   General: NAD, AAOx4 Neurology: Awake and alert, non focal  E ENT: no pallor, no icterus, oral mucosa moist, dressing noted in place c/d/i Cardiovascular: No JVD. S1-S2 present, rhythmic, tachycardic, no gallops, rubs, or murmurs. No lower extremity edema.  Pulmonary:  positive breath sounds bilaterally, adequate air movement, no wheezing, rhonchi or rales. Gastrointestinal. Abdomen with no organomegaly, non tender, no rebound or guarding Skin. Left base of the neck with dressing in place; c/d/i. Musculoskeletal: no joint deformities     Data Reviewed: I have personally reviewed following labs and imaging studies  CBC: Recent Labs  Lab 11/22/18 0909 11/22/18 1419 11/23/18 0453 11/24/18 0420 11/25/18 0525 11/26/18 0530  WBC 26.6*  --  19.3* 14.6* 12.6* 13.6*  NEUTROABS 23.6*  --   --  12.4*  --   --   HGB 12.1* 13.9 11.1* 11.3* 11.7* 12.0*  HCT 39.1 41.0 34.1* 34.8* 36.5* 35.8*  MCV 93.8  --  89.5 89.0 89.0 86.5  PLT 300  --  271 265 285 627   Basic Metabolic Panel: Recent Labs  Lab 11/23/18 0848 11/23/18 1550 11/24/18 0420 11/25/18 0525 11/26/18 0530  NA 133* 132* 133* 135 137  K 3.7 3.5 3.6 3.4* 3.2*  CL 109 105 108 105 105  CO2 18* 19* 18* 20* 23  GLUCOSE 146* 202* 192* 191* 115*  BUN 14 12 10 9 7   CREATININE 0.75 0.74 0.63 0.65 0.71  CALCIUM 8.1* 7.9* 8.0* 8.0* 8.2*  MG  --   --   --  1.9 1.8   GFR: Estimated Creatinine Clearance: 139.5 mL/min (by C-G formula based on SCr of 0.71 mg/dL).  Liver Function Tests: Recent Labs  Lab 11/22/18 0909  AST 17  ALT 13  ALKPHOS 138*  BILITOT 1.7*  PROT 8.9*  ALBUMIN 3.2*   No results for input(s): LIPASE, AMYLASE in the last 168 hours. No results for input(s): AMMONIA in the last 168 hours. Coagulation Profile: Recent Labs  Lab 11/22/18 0909  INR 1.2   Cardiac Enzymes: Recent Labs  Lab 11/22/18 0909 11/22/18 1608  TROPONINI <0.03 <0.03   BNP (last 3 results) No results for input(s): PROBNP in the last 8760 hours. HbA1C: Recent Labs    11/25/18 0525  HGBA1C 11.4*   CBG: Recent Labs  Lab 11/25/18 1608 11/25/18 2051 11/25/18 2313 11/26/18 0503 11/26/18 0723  GLUCAP 87 73 86 108* 108*   Lipid Profile: No results for input(s): CHOL, HDL,  LDLCALC, TRIG, CHOLHDL, LDLDIRECT in the last 72 hours. Thyroid Function Tests: No results for input(s): TSH, T4TOTAL, FREET4, T3FREE, THYROIDAB in the last 72 hours. Anemia Panel: No results for input(s): VITAMINB12, FOLATE, FERRITIN, TIBC, IRON, RETICCTPCT in the last 72 hours.    Radiology Studies: I have reviewed all of the imaging during this hospital visit personally     Scheduled Meds: . enoxaparin (LOVENOX) injection  40 mg Subcutaneous Q24H  . ibuprofen  400 mg Oral TID  . insulin aspart  0-9 Units Subcutaneous TID WC  . insulin aspart  5 Units Subcutaneous TID WC  . insulin detemir  22 Units Subcutaneous BID  . mouth rinse  15 mL Mouth Rinse BID  . metoprolol tartrate  50 mg Oral BID  . pantoprazole  40 mg Oral Daily   Continuous Infusions: . ampicillin-sulbactam (UNASYN) IV Stopped (11/26/18 0549)     LOS: 4 days        Eric J [place] DO

## 2018-11-26 NOTE — Plan of Care (Signed)
  RD consulted for nutrition education regarding diabetes.   Lab Results  Component Value Date   HGBA1C 11.4 (H) 11/25/2018    RD provided "Carbohydrate Counting for People with Diabetes" handout from the Academy of Nutrition and Dietetics in discharge instructions. Discussed different food groups and their effects on blood sugar, emphasizing carbohydrate-containing foods. Provided list of carbohydrates and recommended serving sizes of common foods.  Discussed importance of controlled and consistent carbohydrate intake throughout the day. Provided examples of ways to balance meals/snacks and encouraged intake of high-fiber, whole grain complex carbohydrates. Teach back method used.  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 States he has been out of work for the last 6 months due to a recent toe amputation. Pt seems to be very knowledgeable about food. He typically eats two meals daily that consists of hotdogs or pizza. He drinks mostly water/Hint water and drinks regular soda every once in awhile. Discussed the importance of eating three meals with snacks daily, how to make meals balanced, and drink substitutes he could use.   Pt would benefit from outpatient diabetes services and requests to have an in depth class.   Expect good compliance.  Body mass index is 30.96 kg/m. Pt meets criteria for obese based on current BMI.  Current diet order is carbohydrate modified. Labs and medications reviewed. No further nutrition interventions warranted at this time. RD contact information provided. If additional nutrition issues arise, please re-consult RD.  Mariana Single RD, LDN Clinical Nutrition Pager # [phone]

## 2018-11-27 LAB — CBC
HCT: 28.7 % — ABNORMAL LOW (ref 39.0–52.0)
Hemoglobin: 9.2 g/dL — ABNORMAL LOW (ref 13.0–17.0)
MCH: 28.5 pg (ref 26.0–34.0)
MCHC: 32.1 g/dL (ref 30.0–36.0)
MCV: 88.9 fL (ref 80.0–100.0)
Platelets: 433 10*3/uL — ABNORMAL HIGH (ref 150–400)
RBC: 3.23 MIL/uL — ABNORMAL LOW (ref 4.22–5.81)
RDW: 16.9 % — ABNORMAL HIGH (ref 11.5–15.5)
WBC: 11.5 10*3/uL — ABNORMAL HIGH (ref 4.0–10.5)
nRBC: 0 % (ref 0.0–0.2)

## 2018-11-27 LAB — BASIC METABOLIC PANEL
Anion gap: 9 (ref 5–15)
BUN: 9 mg/dL (ref 6–20)
CO2: 23 mmol/L (ref 22–32)
Calcium: 7.9 mg/dL — ABNORMAL LOW (ref 8.9–10.3)
Chloride: 105 mmol/L (ref 98–111)
Creatinine, Ser: 0.76 mg/dL (ref 0.61–1.24)
GFR calc Af Amer: >60 mL/min (ref >60)
GFR calc non Af Amer: >60 mL/min (ref >60)
Glucose, Bld: 130 mg/dL — ABNORMAL HIGH (ref 70–99)
Potassium: 3.2 mmol/L — ABNORMAL LOW (ref 3.5–5.1)
Sodium: 137 mmol/L (ref 135–145)

## 2018-11-27 LAB — CULTURE, BLOOD (ROUTINE X 2)
Culture: NO GROWTH
Culture: NO GROWTH
Special Requests: ADEQUATE
Special Requests: ADEQUATE

## 2018-11-27 LAB — GLUCOSE, CAPILLARY
Glucose-Capillary: 108 mg/dL — ABNORMAL HIGH (ref 70–99)
Glucose-Capillary: 52 mg/dL — ABNORMAL LOW (ref 70–99)
Glucose-Capillary: 176 mg/dL — ABNORMAL HIGH (ref 70–99)
Glucose-Capillary: 183 mg/dL — ABNORMAL HIGH (ref 70–99)
Glucose-Capillary: 119 mg/dL — ABNORMAL HIGH (ref 70–99)

## 2018-11-27 LAB — AEROBIC/ANAEROBIC CULTURE W GRAM STAIN (SURGICAL/DEEP WOUND)

## 2018-11-27 LAB — MAGNESIUM: Magnesium: 1.9 mg/dL (ref 1.7–2.4)

## 2018-11-27 MED ORDER — HYDROCODONE-ACETAMINOPHEN 7.5-325 MG PO TABS
1.0000 | ORAL_TABLET | Freq: Four times a day (QID) | ORAL | Status: DC | PRN
  Administered 2018-11-27 – 2018-11-29 (×6): 1 via ORAL
  Filled 2018-11-27 (×7): qty 1

## 2018-11-27 MED ORDER — INSULIN DETEMIR 100 UNIT/ML ~~LOC~~ SOLN
20.0000 [IU] | Freq: Two times a day (BID) | SUBCUTANEOUS | Status: DC
  Administered 2018-11-27 – 2018-11-29 (×5): 20 [IU] via SUBCUTANEOUS
  Filled 2018-11-27 (×5): qty 0.2

## 2018-11-27 MED ORDER — POTASSIUM CHLORIDE CRYS ER 20 MEQ PO TBCR
40.0000 meq | EXTENDED_RELEASE_TABLET | ORAL | Status: AC
  Administered 2018-11-27 (×2): 40 meq via ORAL
  Filled 2018-11-27 (×2): qty 2

## 2018-11-27 MED ORDER — ACETAMINOPHEN 325 MG PO TABS
650.0000 mg | ORAL_TABLET | Freq: Four times a day (QID) | ORAL | Status: DC | PRN
  Administered 2018-11-28: 06:00:00 650 mg via ORAL
  Filled 2018-11-27: qty 2

## 2018-11-27 MED ORDER — INSULIN ASPART 100 UNIT/ML ~~LOC~~ SOLN
3.0000 [IU] | Freq: Three times a day (TID) | SUBCUTANEOUS | Status: DC
  Administered 2018-11-27 (×2): 3 [IU] via SUBCUTANEOUS

## 2018-11-27 NOTE — Plan of Care (Signed)
  Problem: Education: Goal: Ability to describe self-care measures that may prevent or decrease complications (Diabetes Survival Skills Education) will improve Outcome: Completed/Met  Patient verbalizes difficulties of getting medications and glucose strips since has been out of work.  Also states "I know there's more I can do to take care of myself"  patient verbalizes understanding of importance of taking medications and following a carb modified diet   Problem: Health Behavior/Discharge Planning: Goal: Ability to manage health-related needs will improve Outcome: Completed/Met   Problem: Fluid Volume: Goal: Ability to achieve a balanced intake and output will improve Outcome: Completed/Met   Problem: Metabolic: Goal: Ability to maintain appropriate glucose levels will improve Outcome: Completed/Met   Problem: Nutritional: Goal: Maintenance of adequate nutrition will improve Outcome: Completed/Met Goal: Maintenance of adequate weight for body size and type will improve Outcome: Completed/Met   Problem: Urinary Elimination: Goal: Ability to achieve and maintain adequate renal perfusion and functioning will improve Outcome: Completed/Met

## 2018-11-27 NOTE — Progress Notes (Signed)
   ENT Progress Note: POD #5 s/p Procedure(s): INCISION AND DRAINAGE OF NECK ABSCESS   Subjective: Stable  Objective: Vital signs in last 24 hours: Temp:  [98.3 F (36.8 C)-98.9 F (37.2 C)] 98.9 F (37.2 C) (04/18 1259) Pulse Rate:  [89-134] 89 (04/18 1259) Resp:  [16-18] 16 (04/18 1259) BP: (109-122)/(71-81) 111/71 (04/18 1259) SpO2:  [99 %-100 %] 100 % (04/18 1259) Weight change:  Last BM Date: 11/25/18  Intake/Output from previous day: 04/17 0701 - 04/18 0700 In: 1740 [P.O.:1440; IV Piggyback:300] Out: 300 [Urine:300] Intake/Output this shift: Total I/O In: 100 [IV Piggyback:100] Out: 650 [Urine:650]  Labs: Recent Labs    11/26/18 0530 11/27/18 0433  WBC 13.6* 11.5*  HGB 12.0* 9.2*  HCT 35.8* 28.7*  PLT 356 433*   Recent Labs    11/26/18 0530 11/27/18 0433  NA 137 137  K 3.2* 3.2*  CL 105 105  CO2 23 23  GLUCOSE 115* 130*  BUN 7 9  CALCIUM 8.2* 7.9*    Studies/Results: No results found.   PHYSICAL EXAM: Dressing change Decreased space -  No d/c   Assessment/Plan: Pt stable     Jerrell Belfast 11/27/2018, 6:36 PM

## 2018-11-27 NOTE — Progress Notes (Signed)
Hypoglycemic Event  CBG: 52  Treatment: 4 oz juice/soda  Symptoms: Sweaty  Follow-up CBG: Time:0810  CBG Result:108  Possible Reasons for Event: Medication regimen: insulin dose - not eating as much while in hospital  Comments/MD notified:Dr [place] made aware via amion.  Resolved with carb snack     Marcell Anger, Marveen Reeks

## 2018-11-27 NOTE — Progress Notes (Signed)
PROGRESS NOTE    Taylor Hughes  TDS:[id number] DOB: Sep 26, 1970 DOA: 11/22/2018 PCP: Jilda Panda, MD    Brief Narrative:   48 year old male presented with pain in the left side of his neck.  He does have significant past medical history for type 2 diabetes mellitus and left diabetic foot.  Apparently he sustained a musculoskeletal injury while doing gardening 48 hours prior to hospitalization, left-sided chest pain that progressed into left neck pain with sensation of fullness in his left neck area/sternal area.  Reported not using his insulin for about 72 hours.  On physical examination his blood pressure was 176/98, heart rate 117, respiratory rate 31, oxygen saturation 100%, he had dry mucous membranes, fullness in his left lower neck area, positive tenderness and local erythema, his lungs were clear to auscultation bilaterally, heart S1-S2 present and rhythmic, the abdomen was soft nontender, no lower extremity edema.  Venous pH is 7.18, NA 125, potassium 5.5, chloride 93, bicarb 9, glucose 689, BUN 20, creatinine 1.39, anion gap 23, white count 26.6, hemoglobin 12.1, hematocrit 39.1, platelets 300, urinalysis with glucose greater than 500, 0-5 white cells, 0-5 red cells, specific gravity 1.023.  Neck CT with phlegmonous inflammatory changes at the thoracic inlet anteriorly, more to the left, measure about 6 cm in diameter with a few tiny air bubbles.  Asymmetric enlargement of the left pectoralis muscle suggesting myositis.  Patient was admitted to the hospital with a working diagnosis left neck abscess/ pectoralis muscle myositis complicated by diabetes ketoacidosis and acute kidney injury.   Assessment & Plan:   Principal Problem:   Sepsis ([place]) Active Problems:   DKA (diabetic ketoacidoses) ([place])   Neck abscess   Essential hypertension   AKI (acute kidney injury) ([place])   Obesity (BMI 30.0-34.9)   Type 2 diabetes mellitus with other specified complication ([place])  Diabetes ketoacidosis:  Resolved Patient presenting with elevated glucose of 689 with anion gap of 23.  Patient was started on IV insulin DKA protocol and now transitioned to subcutaneous insulin. --Continue consistent carbohydrate diet --Hemoglobin A1c 11.4; indicates very poor control outpatient --Levemir decreased from 22u to 20u [place] BID --Novolog decreased from 5u to 3uSC TID w/ meals --Insulin sliding scale for coverage --Diabetic educator following, appreciate assistance --Nutrition consulted per patient request for diabetic diet teaching  Left neck abscess complicated with sepsis (present on admission). Patient underwent source control with neck incision and drainage by ENT on 11/22/2018.  Vancomycin and Zosyn has been de-escalated to Unasyn.  MRSA PCR negative.  Leukocytosis continues to improve. --WBC 26....-->12.6-->13.6-->11.5 today --TMAX 98.3 past 24hrs --Wound culture notable for moderate growth of staph aureus --Blood cultures x2 no growth to date --ENT continues to follow, dressing changes twice daily --Continue Unasyn --Pain control with Norco prn --continue inpatient per Dr. Redmond Baseman through the weekend, if WBC count/fever curve increases; will need to obtain CT Chest/neck with IV contrast for further evaluation of neck abscess/Nec fasciitis.   AKI: Resolved Creatinine elevated to 1.39 on admission.  With improvement of DKA and IV fluid hydration; creatinine has now improved down to 0.65.  Hypokalemia Potassium 3.2 today, magnesium normal at 1.8. --Will replete potassium today --Repeat BMP in the a.m. with magnesium  HTN Atrial fibrillation with RVR --CHA2DS2-VASC = 1 (DM2) --metoprolol 50mg  p.o. twice daily --Alprazolam as needed for anxiety --Continue to monitor on telemetry --repeat echo d/c'd by cardiology (Dr. Harrington Challenger due to current "crisis") --TTE 07/2018 w/ EF 65-70%, mild LVH, no regional wall motion abnormalities --continue to monitor on  telemetry --likely will dc on anticoagulation   Obesity: Calculated BMI is 30.9.  Discussed with patient needs for weight loss measures as this complicates all facets of care.  DVT prophylaxis: enoxaparin   Code Status:  full Family Communication: None Disposition Plan/ discharge barriers: pending clinical improvement.   Body mass index is 30.96 kg/m.     Consultants:   ENT,  Melida Quitter, MD  Procedures:   Left neck abscess I&D on 11/22/2018  Antimicrobials:   Vancomycin 4/13 - 4/14  Zosyn  4/13 - 4/14  Unasyn 4/14>>>   Subjective: Seen and examined at bedside, resting comfortably in bedside chair.  Eating breakfast.  Episode of hypoglycemia last night, and once again this morning.  States diet has improved.  Patient does notice signs of hypoglycemia with sweating.  No other complaints at this time.  Continues on IV Unasyn; ENT accomplishing wound packing twice daily, tolerating dressing changes. Denies headache, no chest pain, no palpitations, no fever/chills/night sweats, no nausea/vomiting/diarrhea, no shortness of breath, no abdominal pain.  No acute events overnight per nursing staff.    Objective: Vitals:   11/26/18 0505 11/26/18 1243 11/26/18 2000 11/27/18 0445  BP: 105/65 102/69 122/81 109/77  Pulse: 83 92 (!) 134 (!) 126  Resp: 18 18  18   Temp: 97.8 F (36.6 C) 99.1 F (37.3 C)  98.3 F (36.8 C)  TempSrc: Oral Oral  Oral  SpO2: 100% 99%  99%  Weight:      Height:        Intake/Output Summary (Last 24 hours) at 11/27/2018 0933 Last data filed at 11/27/2018 6945 Gross per 24 hour  Intake 1500 ml  Output 300 ml  Net 1200 ml   Filed Weights   11/22/18 0850 11/22/18 1730  Weight: 100.7 kg 100.7 kg    Examination:   General: NAD, AAOx4 Neurology: Awake and alert, non focal  E ENT: no pallor, no icterus, oral mucosa moist, dressing noted in place c/d/i Cardiovascular: No JVD. S1-S2 present, rhythmic, tachycardic, no gallops, rubs, or murmurs. No lower extremity edema. Pulmonary:  positive  breath sounds bilaterally, adequate air movement, no wheezing, rhonchi or rales. Gastrointestinal. Abdomen with no organomegaly, non tender, no rebound or guarding Skin. Left base of the neck with dressing in place; c/d/i. Musculoskeletal: no joint deformities     Data Reviewed: I have personally reviewed following labs and imaging studies  CBC: Recent Labs  Lab 11/22/18 0909  11/23/18 0453 11/24/18 0420 11/25/18 0525 11/26/18 0530 11/27/18 0433  WBC 26.6*  --  19.3* 14.6* 12.6* 13.6* 11.5*  NEUTROABS 23.6*  --   --  12.4*  --   --   --   HGB 12.1*   < > 11.1* 11.3* 11.7* 12.0* 9.2*  HCT 39.1   < > 34.1* 34.8* 36.5* 35.8* 28.7*  MCV 93.8  --  89.5 89.0 89.0 86.5 88.9  PLT 300  --  271 265 285 356 433*   < > = values in this interval not displayed.   Basic Metabolic Panel: Recent Labs  Lab 11/23/18 1550 11/24/18 0420 11/25/18 0525 11/26/18 0530 11/27/18 0433  NA 132* 133* 135 137 137  K 3.5 3.6 3.4* 3.2* 3.2*  CL 105 108 105 105 105  CO2 19* 18* 20* 23 23  GLUCOSE 202* 192* 191* 115* 130*  BUN 12 10 9 7 9   CREATININE 0.74 0.63 0.65 0.71 0.76  CALCIUM 7.9* 8.0* 8.0* 8.2* 7.9*  MG  --   --  1.9  1.8 1.9   GFR: Estimated Creatinine Clearance: 139.5 mL/min (by C-G formula based on SCr of 0.76 mg/dL). Liver Function Tests: Recent Labs  Lab 11/22/18 0909  AST 17  ALT 13  ALKPHOS 138*  BILITOT 1.7*  PROT 8.9*  ALBUMIN 3.2*   No results for input(s): LIPASE, AMYLASE in the last 168 hours. No results for input(s): AMMONIA in the last 168 hours. Coagulation Profile: Recent Labs  Lab 11/22/18 0909  INR 1.2   Cardiac Enzymes: Recent Labs  Lab 11/22/18 0909 11/22/18 1608  TROPONINI <0.03 <0.03   BNP (last 3 results) No results for input(s): PROBNP in the last 8760 hours. HbA1C: Recent Labs    11/25/18 0525  HGBA1C 11.4*   CBG: Recent Labs  Lab 11/26/18 1645 11/26/18 1825 11/26/18 2034 11/27/18 0753 11/27/18 0819  GLUCAP 29* 97 165* 52* 108*    Lipid Profile: No results for input(s): CHOL, HDL, LDLCALC, TRIG, CHOLHDL, LDLDIRECT in the last 72 hours. Thyroid Function Tests: No results for input(s): TSH, T4TOTAL, FREET4, T3FREE, THYROIDAB in the last 72 hours. Anemia Panel: No results for input(s): VITAMINB12, FOLATE, FERRITIN, TIBC, IRON, RETICCTPCT in the last 72 hours.    Radiology Studies: I have reviewed all of the imaging during this hospital visit personally     Scheduled Meds: . enoxaparin (LOVENOX) injection  40 mg Subcutaneous Q24H  . ibuprofen  400 mg Oral TID  . insulin aspart  0-9 Units Subcutaneous TID WC  . insulin aspart  3 Units Subcutaneous TID WC  . insulin detemir  20 Units Subcutaneous BID  . mouth rinse  15 mL Mouth Rinse BID  . metoprolol tartrate  50 mg Oral BID  . pantoprazole  40 mg Oral Daily  . potassium chloride  40 mEq Oral Q4H   Continuous Infusions: . ampicillin-sulbactam (UNASYN) IV 3 g (11/27/18 0300)     LOS: 5 days        Briona Korpela J [place] DO

## 2018-11-27 NOTE — Progress Notes (Signed)
   ENT Progress Note: POD #5 s/p Procedure(s): INCISION AND DRAINAGE OF NECK ABSCESS   Subjective: No pain or swelling  Objective: Vital signs in last 24 hours: Temp:  [98.3 F (36.8 C)-99.1 F (37.3 C)] 98.3 F (36.8 C) (04/18 0445) Pulse Rate:  [92-134] 126 (04/18 0445) Resp:  [18] 18 (04/18 0445) BP: (102-122)/(69-81) 109/77 (04/18 0445) SpO2:  [99 %] 99 % (04/18 0445) Weight change:  Last BM Date: 11/26/18  Intake/Output from previous day: 04/17 0701 - 04/18 0700 In: 1740 [P.O.:1440; IV Piggyback:300] Out: 300 [Urine:300] Intake/Output this shift: No intake/output data recorded.  Labs: Recent Labs    11/26/18 0530 11/27/18 0433  WBC 13.6* 11.5*  HGB 12.0* 9.2*  HCT 35.8* 28.7*  PLT 356 433*   Recent Labs    11/26/18 0530 11/27/18 0433  NA 137 137  K 3.2* 3.2*  CL 105 105  CO2 23 23  GLUCOSE 115* 130*  BUN 7 9  CALCIUM 8.2* 7.9*    Studies/Results: No results found.   PHYSICAL EXAM: Ant neck incision w/o erythema, swelling or d/c Good voice and airway   Assessment/Plan: Packing changed - 15cm of moist 4" kerlix packed into wound No bleeding, min pain Cont medical management     Jerrell Belfast 11/27/2018, 9:54 AM

## 2018-11-28 LAB — GLUCOSE, CAPILLARY
Glucose-Capillary: 58 mg/dL — ABNORMAL LOW (ref 70–99)
Glucose-Capillary: 152 mg/dL — ABNORMAL HIGH (ref 70–99)
Glucose-Capillary: 174 mg/dL — ABNORMAL HIGH (ref 70–99)
Glucose-Capillary: 213 mg/dL — ABNORMAL HIGH (ref 70–99)

## 2018-11-28 NOTE — Progress Notes (Signed)
   Subjective:    Patient ID: Taylor Hughes, male    DOB: 08/13/1970, 48 y.o.   MRN: [id number]  HPI Feeling good.  No complaints.    Review of Systems     Objective:   Physical Exam  Tm 99.3 VSS Alert, NAD Neck with packing in place, changed packing, some purulent drainage    Assessment & Plan:  Neck abscess/necrotizing fasciitis, diabetes  Changed dressing.  Wound starting to contract.  Will continue twice daily dressing changes.  May be able to be discharged tomorrow.  Will need home health for twice daily saline Kerlex packing changes.  Culture grew pan-sensitive staph aureus.  Can change to oral antibiotic at discharge.  Follow-up with me Thursday or Friday.

## 2018-11-28 NOTE — Progress Notes (Signed)
   Subjective:    Patient ID: Taylor Hughes, male    DOB: Aug 22, 1970, 48 y.o.   MRN: [id number]  HPI    Review of Systems     Objective:   Physical Exam        Assessment & Plan:  Changed packing.

## 2018-11-28 NOTE — Progress Notes (Addendum)
Hypoglycemic Event  CBG: 58  Treatment: 4 oz juice/soda  Symptoms: sweaty  Follow-up CBG: Time: 8:45 AM  CBG Result: 158  Possible Reasons for Event: Unknown  Comments/MD notified:update during rounds   Rudean Icenhour M. Brigitte Pulse, RN

## 2018-11-28 NOTE — Progress Notes (Signed)
PROGRESS NOTE    Taylor Hughes  TDH:[id number] DOB: Aug 30, 1970 DOA: 11/22/2018 PCP: Jilda Panda, MD    Brief Narrative:   48 year old male presented with pain in the left side of his neck.  He does have significant past medical history for type 2 diabetes mellitus and left diabetic foot.  Apparently he sustained a musculoskeletal injury while doing gardening 48 hours prior to hospitalization, left-sided chest pain that progressed into left neck pain with sensation of fullness in his left neck area/sternal area.  Reported not using his insulin for about 72 hours.  On physical examination his blood pressure was 176/98, heart rate 117, respiratory rate 31, oxygen saturation 100%, he had dry mucous membranes, fullness in his left lower neck area, positive tenderness and local erythema, his lungs were clear to auscultation bilaterally, heart S1-S2 present and rhythmic, the abdomen was soft nontender, no lower extremity edema.  Venous pH is 7.18, NA 125, potassium 5.5, chloride 93, bicarb 9, glucose 689, BUN 20, creatinine 1.39, anion gap 23, white count 26.6, hemoglobin 12.1, hematocrit 39.1, platelets 300, urinalysis with glucose greater than 500, 0-5 white cells, 0-5 red cells, specific gravity 1.023.  Neck CT with phlegmonous inflammatory changes at the thoracic inlet anteriorly, more to the left, measure about 6 cm in diameter with a few tiny air bubbles.  Asymmetric enlargement of the left pectoralis muscle suggesting myositis.  Patient was admitted to the hospital with a working diagnosis left neck abscess/ pectoralis muscle myositis complicated by diabetes ketoacidosis and acute kidney injury.   Assessment & Plan:   Principal Problem:   Sepsis ([place]) Active Problems:   DKA (diabetic ketoacidoses) ([place])   Neck abscess   Essential hypertension   AKI (acute kidney injury) ([place])   Obesity (BMI 30.0-34.9)   Type 2 diabetes mellitus with other specified complication ([place])  Diabetes ketoacidosis:  Resolved Patient presenting with elevated glucose of 689 with anion gap of 23.  Patient was started on IV insulin DKA protocol and now transitioned to subcutaneous insulin. --Continue consistent carbohydrate diet --Hemoglobin A1c 11.4; indicates very poor control outpatient --Levemir 20u [place] BID --We will stop mealtime NovoLog due to hypoglycemic event this morning --Insulin sliding scale for coverage --Diabetic educator following, appreciate assistance --Nutrition consulted per patient request for diabetic diet teaching  Left neck abscess complicated with sepsis (present on admission). Patient underwent source control with neck incision and drainage by ENT on 11/22/2018.  Vancomycin and Zosyn has been de-escalated to Unasyn.  MRSA PCR negative.  Leukocytosis continues to improve. --WBC 26-->12.6-->13.6-->11.5 --TMAX 99.3 past 24hrs --Wound culture notable for moderate growth of staph aureus --Blood cultures x2 no growth to date --ENT continues to follow, dressing changes twice daily --Continue Unasyn; plan to transition to oral antibiotics on discharge, possibly tomorrow --Pain control with Norco prn --continue inpatient per Dr. Redmond Baseman through the weekend, if WBC count/fever curve increases; will need to obtain CT Chest/neck with IV contrast for further evaluation of neck abscess/Nec fasciitis.   AKI: Resolved Creatinine elevated to 1.39 on admission.  With improvement of DKA and IV fluid hydration; creatinine has now improved down to 0.65.  Hypokalemia --Repeat BMP in the a.m. with magnesium  HTN Atrial fibrillation with RVR --CHA2DS2-VASC = 1 (DM2) --metoprolol 50mg  p.o. twice daily --Alprazolam as needed for anxiety --Continue to monitor on telemetry --repeat echo d/c'd by cardiology (Dr. Harrington Challenger due to current "crisis") --TTE 07/2018 w/ EF 65-70%, mild LVH, no regional wall motion abnormalities --continue to monitor on telemetry  Obesity: Calculated  BMI is 30.9.  Discussed with  patient needs for weight loss measures as this complicates all facets of care.  DVT prophylaxis: enoxaparin   Code Status:  full Family Communication: None Disposition Plan/ discharge barriers: Anticipate discharge home in 1-2 days with home health nursing for wound packing  Body mass index is 30.96 kg/m.     Consultants:   ENT,  Melida Quitter, MD  Procedures:   Left neck abscess I&D on 11/22/2018  Antimicrobials:   Vancomycin 4/13 - 4/14  Zosyn  4/13 - 4/14  Unasyn 4/14>>>   Subjective: Patient seen and examined at bedside, resting comfortably.  Hypoglycemic event this morning with glucose 58.  No other complaints at this time.  Continues on IV Unasyn; ENT accomplishing wound packing twice daily, tolerating dressing changes. Denies headache, no chest pain, no palpitations, no fever/chills/night sweats, no nausea/vomiting/diarrhea, no shortness of breath, no abdominal pain.  No other acute events overnight per nursing staff.    Objective: Vitals:   11/27/18 0445 11/27/18 1259 11/27/18 2126 11/28/18 0533  BP: 109/77 111/71 126/86 124/77  Pulse: (!) 126 89 97   Resp: 18 16 16 18   Temp: 98.3 F (36.8 C) 98.9 F (37.2 C) 99.3 F (37.4 C) 98.2 F (36.8 C)  TempSrc: Oral Oral Oral Oral  SpO2: 99% 100% 100% 96%  Weight:      Height:        Intake/Output Summary (Last 24 hours) at 11/28/2018 1207 Last data filed at 11/28/2018 0900 Gross per 24 hour  Intake 460 ml  Output 300 ml  Net 160 ml   Filed Weights   11/22/18 0850 11/22/18 1730  Weight: 100.7 kg 100.7 kg    Examination:   General: NAD, AAOx4 Neurology: Awake and alert, non focal  E ENT: no pallor, no icterus, oral mucosa moist, dressing noted in place c/d/i Cardiovascular: No JVD. S1-S2 present, rhythmic, tachycardic, no gallops, rubs, or murmurs. No lower extremity edema. Pulmonary:  positive breath sounds bilaterally, adequate air movement, no wheezing, rhonchi or rales. Gastrointestinal. Abdomen with  no organomegaly, non tender, no rebound or guarding Skin. Left base of the neck with dressing in place; c/d/i. Musculoskeletal: no joint deformities     Data Reviewed: I have personally reviewed following labs and imaging studies  CBC: Recent Labs  Lab 11/22/18 0909  11/23/18 0453 11/24/18 0420 11/25/18 0525 11/26/18 0530 11/27/18 0433  WBC 26.6*  --  19.3* 14.6* 12.6* 13.6* 11.5*  NEUTROABS 23.6*  --   --  12.4*  --   --   --   HGB 12.1*   < > 11.1* 11.3* 11.7* 12.0* 9.2*  HCT 39.1   < > 34.1* 34.8* 36.5* 35.8* 28.7*  MCV 93.8  --  89.5 89.0 89.0 86.5 88.9  PLT 300  --  271 265 285 356 433*   < > = values in this interval not displayed.   Basic Metabolic Panel: Recent Labs  Lab 11/23/18 1550 11/24/18 0420 11/25/18 0525 11/26/18 0530 11/27/18 0433  NA 132* 133* 135 137 137  K 3.5 3.6 3.4* 3.2* 3.2*  CL 105 108 105 105 105  CO2 19* 18* 20* 23 23  GLUCOSE 202* 192* 191* 115* 130*  BUN 12 10 9 7 9   CREATININE 0.74 0.63 0.65 0.71 0.76  CALCIUM 7.9* 8.0* 8.0* 8.2* 7.9*  MG  --   --  1.9 1.8 1.9   GFR: Estimated Creatinine Clearance: 139.5 mL/min (by C-G formula based on SCr of 0.76 mg/dL).  Liver Function Tests: Recent Labs  Lab 11/22/18 0909  AST 17  ALT 13  ALKPHOS 138*  BILITOT 1.7*  PROT 8.9*  ALBUMIN 3.2*   No results for input(s): LIPASE, AMYLASE in the last 168 hours. No results for input(s): AMMONIA in the last 168 hours. Coagulation Profile: Recent Labs  Lab 11/22/18 0909  INR 1.2   Cardiac Enzymes: Recent Labs  Lab 11/22/18 0909 11/22/18 1608  TROPONINI <0.03 <0.03   BNP (last 3 results) No results for input(s): PROBNP in the last 8760 hours. HbA1C: No results for input(s): HGBA1C in the last 72 hours. CBG: Recent Labs  Lab 11/27/18 1615 11/27/18 2124 11/28/18 0718 11/28/18 0841 11/28/18 1128  GLUCAP 183* 119* 58* 152* 174*   Lipid Profile: No results for input(s): CHOL, HDL, LDLCALC, TRIG, CHOLHDL, LDLDIRECT in the last 72  hours. Thyroid Function Tests: No results for input(s): TSH, T4TOTAL, FREET4, T3FREE, THYROIDAB in the last 72 hours. Anemia Panel: No results for input(s): VITAMINB12, FOLATE, FERRITIN, TIBC, IRON, RETICCTPCT in the last 72 hours.    Radiology Studies: I have reviewed all of the imaging during this hospital visit personally     Scheduled Meds: . enoxaparin (LOVENOX) injection  40 mg Subcutaneous Q24H  . ibuprofen  400 mg Oral TID  . insulin aspart  0-9 Units Subcutaneous TID WC  . insulin detemir  20 Units Subcutaneous BID  . mouth rinse  15 mL Mouth Rinse BID  . metoprolol tartrate  50 mg Oral BID  . pantoprazole  40 mg Oral Daily   Continuous Infusions: . ampicillin-sulbactam (UNASYN) IV 3 g (11/28/18 0532)     LOS: 6 days        Taylor Dirks J [place] DO

## 2018-11-29 DIAGNOSIS — I4891 Unspecified atrial fibrillation: Secondary | ICD-10-CM

## 2018-11-29 LAB — CBC
HCT: 37.5 % — ABNORMAL LOW (ref 39.0–52.0)
Hemoglobin: 12.1 g/dL — ABNORMAL LOW (ref 13.0–17.0)
MCH: 28.9 pg (ref 26.0–34.0)
MCHC: 32.3 g/dL (ref 30.0–36.0)
MCV: 89.5 fL (ref 80.0–100.0)
Platelets: 532 10*3/uL — ABNORMAL HIGH (ref 150–400)
RBC: 4.19 MIL/uL — ABNORMAL LOW (ref 4.22–5.81)
RDW: 16.8 % — ABNORMAL HIGH (ref 11.5–15.5)
WBC: 8.8 10*3/uL (ref 4.0–10.5)
nRBC: 0 % (ref 0.0–0.2)

## 2018-11-29 LAB — GLUCOSE, CAPILLARY
Glucose-Capillary: 137 mg/dL — ABNORMAL HIGH (ref 70–99)
Glucose-Capillary: 160 mg/dL — ABNORMAL HIGH (ref 70–99)
Glucose-Capillary: 142 mg/dL — ABNORMAL HIGH (ref 70–99)

## 2018-11-29 LAB — BASIC METABOLIC PANEL
Anion gap: 7 (ref 5–15)
BUN: 9 mg/dL (ref 6–20)
CO2: 28 mmol/L (ref 22–32)
Calcium: 8.3 mg/dL — ABNORMAL LOW (ref 8.9–10.3)
Chloride: 101 mmol/L (ref 98–111)
Creatinine, Ser: 0.72 mg/dL (ref 0.61–1.24)
GFR calc Af Amer: >60 mL/min (ref >60)
GFR calc non Af Amer: >60 mL/min (ref >60)
Glucose, Bld: 159 mg/dL — ABNORMAL HIGH (ref 70–99)
Potassium: 4.3 mmol/L (ref 3.5–5.1)
Sodium: 136 mmol/L (ref 135–145)

## 2018-11-29 LAB — MAGNESIUM: Magnesium: 2.1 mg/dL (ref 1.7–2.4)

## 2018-11-29 MED ORDER — ASPIRIN EC 81 MG PO TBEC: 81.0000 mg | DELAYED_RELEASE_TABLET | Freq: Every day | ORAL | 11 refills | Status: DC

## 2018-11-29 MED ORDER — HYDROCODONE-ACETAMINOPHEN 7.5-325 MG PO TABS: 1.0000 | ORAL_TABLET | Freq: Four times a day (QID) | ORAL | 0 refills | Status: AC | PRN

## 2018-11-29 MED ORDER — PANTOPRAZOLE SODIUM 40 MG PO TBEC: 40.0000 mg | DELAYED_RELEASE_TABLET | Freq: Every day | ORAL | 0 refills | Status: DC

## 2018-11-29 MED ORDER — SULFAMETHOXAZOLE-TRIMETHOPRIM 800-160 MG PO TABS: 1.0000 | ORAL_TABLET | Freq: Two times a day (BID) | ORAL | 0 refills | Status: AC

## 2018-11-29 MED ORDER — INSULIN DETEMIR 100 UNIT/ML FLEXPEN: 20.0000 [IU] | Freq: Every day | SUBCUTANEOUS | 0 refills | Status: DC

## 2018-11-29 MED ORDER — INSULIN PEN NEEDLE 32G X 5 MM MISC: 0 refills | Status: DC

## 2018-11-29 MED ORDER — BLOOD GLUCOSE MONITOR KIT: PACK | 0 refills | Status: DC

## 2018-11-29 MED ORDER — METOPROLOL TARTRATE 50 MG PO TABS: 50.0000 mg | ORAL_TABLET | Freq: Two times a day (BID) | ORAL | 0 refills | Status: DC

## 2018-11-29 MED FILL — LEVEMIR FLEXTOUCH 100 UNITS: 100 | 30 days supply | Qty: 6 | Fill #0

## 2018-11-29 MED FILL — TRUEplus 5-BEVEL PEN NEEDLE: 31G X 8 MM | 30 days supply | Qty: 100 | Fill #0

## 2018-11-29 MED FILL — TRUE METRIX TEST STRIP: 25 days supply | Qty: 100 | Fill #0

## 2018-11-29 MED FILL — PANTOPRAZOLE SOD DR 40 MG T: 40 | 30 days supply | Qty: 30 | Fill #0

## 2018-11-29 MED FILL — !TRUE METRIX BLOOD GLUCOSE: 365 days supply | Qty: 1 | Fill #0

## 2018-11-29 MED FILL — TRUEplus LANCETS 28G MISC: 25 days supply | Qty: 100 | Fill #0

## 2018-11-29 MED FILL — METOPROLOL TARTRATE 50 MG T: 50 | 30 days supply | Qty: 60 | Fill #0

## 2018-11-29 MED FILL — SULFAMETHOXAZOLE-TMP DS TAB: 800-160 | 10 days supply | Qty: 20 | Fill #0

## 2018-11-29 NOTE — TOC Transition Note (Signed)
Transition of Care [facility]) - CM/SW Discharge Note   Patient Details  Name: Filiberto Wamble MRN: [id number] Date of Birth: [birth year]  Transition of Care [facility]) CM/SW Contact:  Dessa Phi, RN Phone Number: 11/29/2018, 10:18 AM   Clinical Narrative: d/c home w/HHC-HHRN-[facility] rep Santiago Glad aware of d/c home w/HHRN-wound care instruction. Patient aware to go directly to [facility] pharmacy for 1x med refill,glucose meter kit,insulin, also to call for pcp hospital f/u appt. No further CM needs.      Final next level of care: [place] Barriers to Discharge: No Barriers Identified   Patient Goals and CMS Choice Patient states their goals for this hospitalization and ongoing recovery are:: get better CMS Medicare.gov Compare Post Acute Care list provided to:: Patient Choice offered to / list presented to : Patient  Discharge Placement                       Discharge Plan and Services   Discharge Planning Services: Medication Assistance                HH Arranged: RN [facility] Agency: [facility] ([facility])   Social Determinants of Health (SDOH) Interventions     Readmission Risk Interventions No flowsheet data found.

## 2018-11-29 NOTE — Progress Notes (Signed)
Patient discharged home.  Reviewed AVS and medications.  Follow up appointments in place.  Patient educated on importance of completing abx dose and monitoring/controlling blood sugar.  Patient verbalizes understanding.  All questions answered satisfactorily.  Wife here to take home - patient in NAD at this time.  Assisted off unit via WC by NT

## 2018-11-29 NOTE — TOC Transition Note (Signed)
Transition of Care [facility]) - CM/SW Discharge Note   Patient Details  Name: Taylor Hughes MRN: [id number] Date of Birth: 28-Apr-1971  Transition of Care [facility]) CM/SW Contact:  Dessa Phi, RN Phone Number: 11/29/2018, 10:59 AM   Clinical Narrative: Patient does not have his cell phone-he prefers [place] to contact his spouse-TC Mana Morison c#[phone]-informed of d/c & to go directly to [facility] pharmacy @ d/c for [facility] will follow for wound care. No further CM needs.     Final next level of care: [place] Barriers to Discharge: No Barriers Identified   Patient Goals and CMS Choice Patient states their goals for this hospitalization and ongoing recovery are:: get better CMS Medicare.gov Compare Post Acute Care list provided to:: Patient Choice offered to / list presented to : Patient  Discharge Placement                       Discharge Plan and Services   Discharge Planning Services: Medication Assistance                HH Arranged: RN [facility] Agency: [place] ([facility])   Social Determinants of Health (SDOH) Interventions     Readmission Risk Interventions No flowsheet data found.

## 2018-11-29 NOTE — Progress Notes (Signed)
Patient stated he had some money with him that he brought from home, I encouraged him to send it to security to be locked up but patient refused, stated he does not trust them to keep his money safe, remind patient that if it gets lost the hospital will not be responsible. He stated he will keep it safe himself.

## 2018-11-29 NOTE — Discharge Instructions (Signed)
Type 2 Diabetes Mellitus, Diagnosis, Adult Type 2 diabetes (type 2 diabetes mellitus) is a long-term (chronic) disease. It may be caused by one or both of these problems:  Your pancreas does not make enough of a hormone called insulin.  Your body does not react in a normal way to insulin that it makes. Insulin lets sugars (glucose) go into cells in your body. This gives you energy. If you have type 2 diabetes, sugars cannot get into cells. This causes high blood sugar (hyperglycemia). Your doctor will set treatment goals for you. Generally, you should have these blood sugar levels:  Before meals (preprandial): 80-130 mg/dL (4.4-7.2 mmol/L).  After meals (postprandial): below 180 mg/dL (10 mmol/L).  A1c (hemoglobin A1c) level: less than 7%. Follow these instructions at home: Questions to ask your doctor  You may want to ask these questions: ? Do I need to meet with a diabetes educator? ? Where can I find a support group for people with diabetes? ? What equipment will I need to care for myself at home? ? What diabetes medicines do I need? When should I take them? ? How often do I need to check my blood sugar? ? What number can I call if I have questions? ? When is my next doctor's visit? General instructions  Take over-the-counter and prescription medicines only as told by your doctor.  Keep all follow-up visits as told by your doctor. This is important. Contact a doctor if:  Your blood sugar is at or above 240 mg/dL (13.3 mmol/L) for 2 days in a row.  You have been sick for 2 days or more, and you are not getting better.  You have had a fever for 2 days or more, and you are not getting better.  You have any of these problems for more than 6 hours: ? You cannot eat or drink. ? You feel sick to your stomach (nauseous). ? You throw up (vomit). ? You have watery poop (diarrhea). Get help right away if:  Your blood sugar is lower than 54 mg/dL (3 mmol/L).  You get  confused.  You have trouble: ? Thinking clearly. ? Breathing.  You have moderate or large ketone levels in your pee (urine). Summary  Type 2 diabetes is a long-term (chronic) disease. Your pancreas may not make enough of a hormone called insulin, or your body may not react normally to insulin that it makes.  Take over-the-counter and prescription medicines only as told by your doctor.  Keep all follow-up visits as told by your doctor. This is important. This information is not intended to replace advice given to you by your health care provider. Make sure you discuss any questions you have with your health care provider. Document Released: 05/06/2008 Document Revised: 02/26/2017 Document Reviewed: 08/31/2015 Elsevier Interactive Patient Education  2019 [place]. Carbohydrate Counting for People with Diabetes  Why Is Carbohydrate Counting Important? Counting carbohydrate servings may help you control your blood glucose level so that you feel better. The balance between the carbohydrates you eat and insulin determines what your blood glucose level will be after eating. Carbohydrate counting can also help you plan your meals. Which Foods Have Carbohydrates? Foods with carbohydrates include: Breads, crackers, and cereals Pasta, rice, and grains Starchy vegetables, such as potatoes, corn, and peas Beans and legumes Milk, soy milk, and yogurt Fruits and fruit juices Sweets, such as cakes, cookies, ice cream, jam, and jelly Carbohydrate Servings In diabetes meal planning, 1 serving of a food with carbohydrate  has about 15 grams of carbohydrate: Check serving sizes with measuring cups and spoons or a food scale. Read the Nutrition Facts on food labels to find out how many grams of carbohydrate are in foods you eat. The food lists in this handout show portions that have about 15 grams of carbohydrate.  Tips Meal Planning Tips An Eating Plan tells you how many carbohydrate  servings to eat at your meals and snacks. For many adults, eating 3 to 5 servings of carbohydrate foods at each meal and 1 or 2 carbohydrate servings for each snack works well. In a healthy daily Eating Plan, most carbohydrates come from: At least 6 servings of fruits and nonstarchy vegetables At least 6 servings of grains, beans, and starchy vegetables, with at least 3 servings from whole grains At least 2 servings of milk or milk products Check your blood glucose level regularly. It can tell you if you need to adjust when you eat carbohydrates. Eating foods that have fiber, such as whole grains, and having very few salty foods is good for your health. Eat 4 to 6 ounces of meat or other protein foods (such as soybean burgers) each day. Choose low-fat sources of protein, such as lean beef, lean pork, chicken, fish, low-fat cheese, or vegetarian foods such as soy. Eat some healthy fats, such as olive oil, canola oil, and nuts. Eat very little saturated fats. These unhealthy fats are found in butter, cream, and high-fat meats, such as bacon and sausage. Eat very little or no trans fats. These unhealthy fats are found in all foods that list partially hydrogenated oil as an Ingredient.  Label Reading Tips The Nutrition Facts panel on a label lists the grams of total carbohydrate in 1 standard serving. The labels standard serving may be larger or smaller than 1 carbohydrate serving. To figure out how many carbohydrate servings are in the food: First, look at the labels standard serving size. Check the grams of total carbohydrate. This is the amount of carbohydrate in 1 standard serving. Divide the grams of total carbohydrate by 15. This number equals the number of carbohydrate servings in 1 standard serving. Remember: 1 carbohydrate serving is 15 grams of carbohydrate. Note: You may ignore the grams of sugars on the Nutrition Facts panel because they are included in the grams of total  carbohydrate.  Foods Recommended 1 serving = about 15 grams of carbohydrate Starches 1 slice bread (1 ounce) 1 tortilla (6-inch size)  large bagel (1 ounce) 2 taco shells (5-inch size)  hamburger or hot dog bun ( ounce)  cup ready-to-eat unsweetened cereal  cup cooked cereal 1 cup broth-based soup 4 to 6 small crackers 1/3 cup pasta or rice (cooked)  cup beans, peas, corn, sweet potatoes, winter squash, or mashed or boiled potatoes (cooked)  large baked potato (3 ounces)  ounce pretzels, potato chips, or tortilla chips 3 cups popcorn (popped) Fruit 1 small fresh fruit ( to 1 cup)  cup canned or frozen fruit 2 tablespoons dried fruit (blueberries, cherries, cranberries, mixed fruit, raisins) 17 small grapes (3 ounces) 1 cup melon or berries  cup unsweetened fruit juice Milk 1 cup fat-free or reduced-fat milk 1 cup soy milk 2/3 cup (6 ounces) nonfat yogurt sweetened with sugar-free sweetener Copyright Academy of Nutrition and Dietetics. This handout may be duplicated for client education. Page 4/7 Sweets and Desserts 2-inch square cake (unfrosted) 2 small cookies (2/3 ounce)  cup ice cream or frozen yogurt  cup sherbet or sorbet 1 tablespoon  syrup, jam, jelly, table sugar, or honey 2 tablespoons light syrup Other Foods Count 1 cup raw vegetables or  cup cooked nonstarchy vegetables as zero (0) carbohydrate servings or free foods. If you eat 3 or more servings at one meal, count them as 1 carbohydrate serving. Foods that have less than 20 calories in each serving also may be counted as zero carbohydrate servings or free foods. Count 1 cup of casserole or other mixed foods as 2 carbohydrate servings.  Carbohydrate Counting for People with Diabetes Sample 1-Day Menu Breakfast 1 extra-small banana (1 carbohydrate serving) 3/4 cup corn flakes (1 carbohydrate serving) 1 cup low-fat or fat-free milk (1 carbohydrate serving) 1 slice whole wheat bread (1  carbohydrate serving) 1 teaspoon margarine Lunch 2 ounces [place] slices 2 slices whole wheat bread (2 carbohydrate servings) 2 lettuce leaves 4 celery sticks 4 carrot sticks 1 medium apple (1 carbohydrate serving) 1 cup low-fat or fat-free milk (1 carbohydrate serving) Afternoon Snack 2 tablespoons raisins (1 carbohydrate serving) 3/4 ounce unsalted mini pretzels (1 carbohydrate serving) Evening Meal 3 ounces lean roast beef 1/2 large baked potato (2 carbohydrate servings) 1 tablespoon reduced-fat sour cream 1/2 cup green beans 1 cup vegetable salad 1 tablespoon light salad dressing 1 whole wheat dinner roll (1 carbohydrate serving) 1 teaspoon margarine 1 cup melon balls (1 carbohydrate serving) Evening Snack 6 ounces low-fat sugar-free fruit yogurt (1 carbohydrate serving) 2 tablespoons unsalted nuts  Carbohydrate Counting for People with Diabetes Vegan Sample 1-Day Menu Breakfast 1 cup cooked oatmeal (2 carbohydrate servings)  cup blueberries (1 carbohydrate serving) 2 tablespoons flaxseeds 1 cup soymilk fortified with calcium and vitamin D 1 cup coffee Lunch 2 slices whole wheat bread (2 carbohydrate servings)  cup baked tofu  cup lettuce 2 slices tomato 2 slices avocado  cup baby carrots 1 orange (1 carbohydrate serving) 1 cup soymilk fortified with calcium and vitamin D Evening Meal Burrito made with: 1 6-inch corn tortilla (1 carbohydrate serving) 1 cup refried vegetarian beans (1 carbohydrate serving)  cup chopped tomatoes  cup lettuce  cup salsa 1/3 cup [place] rice (1 carbohydrate serving) 1 tablespoon olive oil for rice  cup zucchini Evening Snack 6 small whole grain crackers (1 carbohydrate serving) 2 apricots ( carbohydrate serving)  cup unsalted peanuts ( carbohydrate serving)  Carbohydrate Counting for People with Diabetes Vegetarian (Lacto-Ovo) Sample 1-Day Menu Breakfast 1 cup cooked oatmeal (2 carbohydrate servings)  cup blueberries  (1 carbohydrate serving) 2 tablespoons flaxseeds 1 egg 1 cup 1% milk (1 carbohydrate serving) 1 cup coffee Lunch 2 slices whole wheat bread (2 carbohydrate servings) 2 ounces low-fat cheese  cup lettuce 2 slices tomato 2 slices avocado  cup baby carrots 1 orange (1 carbohydrate serving) 1 cup unsweetened tea Evening Meal Burrito made with: 1 6-inch corn tortilla (1 carbohydrate serving)  cup refried vegetarian beans (1 carbohydrate serving)  cup tomatoes  cup lettuce  cup salsa 1/3 cup [place] rice (1 carbohydrate serving) 1 tablespoon olive oil for rice  cup zucchini 1 cup 1% milk (1 carbohydrate serving) Evening Snack 6 small whole grain crackers (1 carbohydrate serving) 2 apricots ( carbohydrate serving)  cup unsalted peanuts ( carbohydrate serving)

## 2018-11-29 NOTE — Progress Notes (Signed)
   Subjective:    Patient ID: Taylor Hughes, male    DOB: 02/12/1971, 48 y.o.   MRN: [id number]  HPI Feeling good.  No complaints.  Review of Systems     Objective:   Physical Exam Tm 99, VSS Alert, NAD Neck wound packed, changed packing, some purulent fluid. WBC 8.8     Assessment & Plan:  Neck abscess/necrotizing fasciitis, diabetes  Changed packing.  OK to discharge home with home health performing twice daily saline Kerlex packing changes.  Discharge on oral antibiotic, doxycycline or Bactrim DS, for 10 days.  Diabetes management.  Follow-up with me Friday.

## 2018-11-29 NOTE — Discharge Summary (Signed)
Physician Discharge Summary  Zain Lankford DDU:[id number] DOB: 1970/11/02 DOA: 11/22/2018  PCP: Jilda Panda, MD  Admit date: 11/22/2018 Discharge date: 11/29/2018  Admitted From: Home Disposition:  Home  Recommendations for Outpatient Follow-up:  1. Follow up with PCP in 1 week 2. Continue to adjust insulin, may consider mealtime insulin based on his home glucose log 3. Patient with atrial fibrillation during hospitalization, chads vasc score = 1, started on aspirin, consider repeat EKG versus initiation of chronic anticoagulation outpatient especially once recovers from neck abscess 4. Patient has follow-up with Dr. Redmond Baseman, ENT on Friday 4/24 to continue to follow neck abscess 5. Patient continues with wet-to-dry Kerlix dressing changes BID to neck wound  Home Health: Yes, RN for dressing changes Equipment/Devices: none  Discharge Condition: Stable CODE STATUS: Full code Diet recommendation: Heart Healthy / Carb Modified  History of present illness:  48 year old male presented with pain in the left side of his neck.  He does have significant past medical history for type 2 diabetes mellitus and left diabetic foot.  Apparently he sustained a musculoskeletal injury while doing gardening 48 hours prior to hospitalization, left-sided chest pain that progressed into left neck pain with sensation of fullness in his left neck area/sternal area.  Reported not using his insulin for about 72 hours.  On physical examination his blood pressure was 176/98, heart rate 117, respiratory rate 31, oxygen saturation 100%, he had dry mucous membranes, fullness in his left lower neck area, positive tenderness and local erythema, his lungs were clear to auscultation bilaterally, heart S1-S2 present and rhythmic, the abdomen was soft nontender, no lower extremity edema.  Venous pH is 7.18, NA 125, potassium 5.5, chloride 93, bicarb 9, glucose 689, BUN 20, creatinine 1.39, anion gap 23, white count 26.6, hemoglobin  12.1, hematocrit 39.1, platelets 300, urinalysis with glucose greater than 500, 0-5 white cells, 0-5 red cells, specific gravity 1.023.  Neck CT with phlegmonous inflammatory changes at the thoracic inlet anteriorly, more to the left, measure about 6 cm in diameter with a few tiny air bubbles.  Asymmetric enlargement of the left pectoralis muscle suggesting myositis.  Patient was admitted to the hospital with a working diagnosis left neck abscess/ pectoralis muscle myositis complicated by diabetes ketoacidosis and acute kidney injury.   Hospital course:  Diabetes ketoacidosis: Resolved Patient presenting with elevated glucose of 689 with anion gap of 23.  Patient was started on IV insulin DKA protocol and now transitioned to subcutaneous insulin.  Hemoglobin A1c elevated to 11.4 indicating very poor outpatient control of his blood sugar.  Patient was started on Levemir 20 units subcutaneously twice daily.  He was also initially started on mealtime insulin with NovoLog but had a few hypoglycemic events during hospitalization and was subsequently discontinued.  Patient was seen by the diabetic educator and will discharge home on Levemir 20 units subcutaneously twice daily.  Patient instructed to maintain a glucose log to bring to his PCP visit for further insulin titration.  Patient was extensively educated on diabetic diet by the diabetic educator as well as the inpatient nutritionist.  Based on his outpatient blood sugar log, may consider adding mealtime insulin if his glucose continues to rise.  Left neck abscess complicated with sepsis (present on admission). Patient underwent source control with neck incision and drainage by ENT on 11/22/2018. Vancomycin and Zosyn has been de-escalated to Unasyn.  MRSA PCR negative.  White blood cell count on admission elevated to 26.  Wound culture notable for MSSA.  Blood cultures  negative to date.  Leukocytosis has resolved.  Patient will continue antibiotics  following discharge with Bactrim DS twice daily x10 days.  Patient will continue wet-to-dry dressing changes with Kerlix twice daily, home health RN ordered.  Patient will follow-up with Dr. Redmond Baseman, ENT outpatient on Friday 2020.  AKI: Resolved Creatinine elevated to 1.39 on admission.  With improvement of DKA and IV fluid hydration; creatinine has now improved down to 0.65.  HTN Atrial fibrillation with RVR Patient was noted to have atrial fibrillation with RVR during hospitalization.  Unclear if he has had this diagnosis previously.  Likely incited further by acute infectious process and DKA. CHA2DS2-VASC = 1 (DM2).  Patient was started on metoprolol increased to 50 mg p.o. twice daily with good rate control.  Attempted to repeat echocardiogram which was discontinued by cardiology (Dr. Harrington Challenger) due to current infection ("crisis") protocols limiting procedures.  Patient had a TTE on 07/2018 with EF 60-70%, mild LVH, and no regional wall abnormalities.  With will discharge on aspirin 81 mg daily.  Recommend repeat EKG at next PCP visit and consideration of chronic anticoagulation once neck abscess resolved and will not require any further surgical intervention.  Obesity: Calculated BMI is 30.9.  Discussed with patient needs for weight loss measures as this complicates all facets of care.  Discharge Diagnoses:  Active Problems:   Neck abscess   Essential hypertension   AKI (acute kidney injury) ([place])   Obesity (BMI 30.0-34.9)   Type 2 diabetes mellitus with other specified complication [facility])    Discharge Instructions   Allergies as of 11/29/2018   No Known Allergies     Medication List    STOP taking these medications   insulin aspart 100 UNIT/ML injection Commonly known as:  novoLOG   insulin glargine 100 UNIT/ML injection Commonly known as:  LANTUS     TAKE these medications   aspirin EC 81 MG tablet Take 1 tablet (81 mg total) by mouth daily.   blood glucose meter kit and  supplies Kit Dispense based on patient and insurance preference. Use up to four times daily as directed. (FOR ICD-9 250.00, 250.01).   HYDROcodone-acetaminophen 7.5-325 MG tablet Commonly known as:  NORCO Take 1 tablet by mouth every 6 (six) hours as needed for up to 5 days for moderate pain (give 2nd).   insulin detemir 100 unit/ml Soln Commonly known as:  LEVEMIR Inject 0.2 mLs (20 Units total) into the skin daily for 30 days.   Insulin Pen Needle 32G X 5 MM Misc Use with levemir pen as prescribed   metoprolol tartrate 50 MG tablet Commonly known as:  LOPRESSOR Take 1 tablet (50 mg total) by mouth 2 (two) times daily for 30 days. What changed:    medication strength  how much to take   pantoprazole 40 MG tablet Commonly known as:  PROTONIX Take 1 tablet (40 mg total) by mouth daily. Start taking on:  November 30, 2018   sulfamethoxazole-trimethoprim 800-160 MG tablet Commonly known as:  BACTRIM DS Take 1 tablet by mouth 2 (two) times daily for 10 days.            Durable Medical Equipment  (From admission, onward)         Start     Ordered   11/26/18 1224  DME Glucometer  Once     11/26/18 1223         Follow-up [facility]. Schedule an appointment as soon as possible  for a visit.   Contact information: [phone] Primary Care @ [facility] [place]         Melida Quitter, MD. Schedule an appointment as soon as possible for a visit on 12/03/2018.   Specialty:  Otolaryngology Contact information: [address] [place] [address] [phone]        [facility] up.   Why:  go directly to pharmacy for medicines. Contact information: [address], [facility].   Specialty:  Home Health Services Why:   [facility]         No Known Allergies  Consultations:  ENT, Redmond Baseman   Procedures/Studies: Ct Soft Tissue Neck W Contrast  Result Date: 11/22/2018 CLINICAL DATA:  Sudden onset of swelling in the suprasternal region EXAM: CT NECK WITH CONTRAST TECHNIQUE: Multidetector CT imaging of the neck was performed using the standard protocol following the bolus administration of intravenous contrast. CONTRAST:  167m OMNIPAQUE IOHEXOL 350 MG/ML SOLN COMPARISON:  None. FINDINGS: Pharynx and larynx: No mucosal or submucosal lesion is seen. Salivary glands: Parotid and submandibular glands are normal. Thyroid: Normal Lymph nodes: No enlarged or low-density nodes seen in the neck itself. Normal bilateral nodes. Vascular: Normal Limited intracranial: Normal Visualized orbits: Normal Mastoids and visualized paranasal sinuses: Clear Skeleton: No acute skeletal abnormality is seen. Upper chest: There appears to be asymmetric swelling of the left pectoralis musculature. There is extensive soft tissue abnormality at the thoracic inlet anteriorly extending more towards the left. The area of concern measures about 6 cm in diameter and contains a few tiny air bubbles. This is most consistent with phlegmonous inflammatory change. Most commonly, this might emanate from a septic sternoclavicular joint, but there is no distinct evidence of that on the CT. Other: None IMPRESSION: Phlegmonous inflammatory change at the thoracic inlet anteriorly, more to the left, measuring about 6 cm in diameter with a few tiny air bubbles. Asymmetric enlargement of the left pectoralis muscle suggesting myositis. Often, infection in this region will arise from the sternoclavicular joint, but I cannot establish that in this case. Electronically Signed   By: MNelson ChimesM.D.   On: 11/22/2018 11:00   Ct Angio Chest Pe W And/or Wo Contrast  Result Date: 11/22/2018 CLINICAL DATA:  Acute onset chest pain 2 days ago after trying to start lawn mower.  Chest pain, shortness of breath, and tachycardia. EXAM: CT ANGIOGRAPHY CHEST WITH CONTRAST TECHNIQUE: Multidetector CT imaging of the chest was performed using the standard protocol during bolus administration of intravenous contrast. Multiplanar CT image reconstructions and MIPs were obtained to evaluate the vascular anatomy. CONTRAST:  1021mOMNIPAQUE IOHEXOL 350 MG/ML SOLN COMPARISON:  None. FINDINGS: Cardiovascular: Satisfactory opacification of pulmonary arteries noted, however evaluation of peripheral pulmonary arteries is limited by significant respiratory motion artifact. No central pulmonary emboli identified. No evidence of thoracic aortic dissection or aneurysm. Mediastinum/Nodes: No masses or pathologically enlarged lymph nodes identified within the mediastinum. Abnormal soft tissue density is seen in the anterior lower neck just above the sternal notch. Lungs/Pleura: No pulmonary mass, infiltrate, or effusion. Mild right lower lobe scarring noted. Upper abdomen: No acute findings. Musculoskeletal: No suspicious bone lesions identified. Multiple old right rib fracture deformities noted. Review of the MIP images confirms the above findings. IMPRESSION: 1. Suboptimal evaluation of  peripheral pulmonary arteries due to significant respiratory motion artifact. No central pulmonary emboli identified. 2. No evidence of thoracic aortic dissection or aneurysm. 3. Abnormal soft tissue density in anterior lower neck above the sternal notch. See separate report for neck CT also performed today. Electronically Signed   By: Earle Gell M.D.   On: 11/22/2018 11:29   Vas [place] Lower Extremity Venous (dvt) (only [facility])  Result Date: 11/22/2018  Lower Venous Study Indications: Edema, and SOB.  Limitations: Body habitus and interstitial edema. Comparison Study: Prior negative study for comparison from 07/29/18 Performing Technologist: Sharion Dove RVS  Examination Guidelines: A complete evaluation includes B-mode  imaging, spectral Doppler, color Doppler, and power Doppler as needed of all accessible portions of each vessel. Bilateral testing is considered an integral part of a complete examination. Limited examinations for reoccurring indications may be performed as noted.  Right Venous Findings: +---+---------------+---------+-----------+----------+-------+    CompressibilityPhasicitySpontaneityPropertiesSummary +---+---------------+---------+-----------+----------+-------+ CFVFull           Yes      Yes                          +---+---------------+---------+-----------+----------+-------+  Left Venous Findings: +---------+---------------+---------+-----------+----------+--------------+          CompressibilityPhasicitySpontaneityPropertiesSummary        +---------+---------------+---------+-----------+----------+--------------+ CFV      Full           Yes      Yes                                 +---------+---------------+---------+-----------+----------+--------------+ SFJ      Full                                                        +---------+---------------+---------+-----------+----------+--------------+ FV Prox  Full                                                        +---------+---------------+---------+-----------+----------+--------------+ FV Mid   Full                                                        +---------+---------------+---------+-----------+----------+--------------+ FV DistalFull                                                        +---------+---------------+---------+-----------+----------+--------------+ PFV      Full                                                        +---------+---------------+---------+-----------+----------+--------------+ POP      Full  Yes      Yes                                 +---------+---------------+---------+-----------+----------+--------------+ PTV      Full                                                         +---------+---------------+---------+-----------+----------+--------------+ PERO                                                  Not visualized +---------+---------------+---------+-----------+----------+--------------+    Summary: Right: No evidence of common femoral vein obstruction. Left: There is no evidence of deep vein thrombosis in the lower extremity. However, portions of this examination were limited- see technologist comments above.  *See table(s) above for measurements and observations. Electronically signed by Monica Martinez MD on 11/22/2018 at 5:15:48 PM.    Final       Subjective: Patient seen and examined at bedside, no complaints this morning.  ENT changed dressing this morning, states okay to discharge home.  Patient's blood sugar better controlled.  Patient states has set up with PCP appointment this week, and understands that he needs to be compliant with his consistent carbohydrate diet as well as his insulin regimen.  No other complaints this time.  Denies headache, no fever/chills/night sweats, no chest pain, no palpitations, no shortness of breath, no abdominal pain.  No acute events overnight per nursing staff.   Discharge Exam: Vitals:   11/28/18 2124 11/29/18 0457  BP: 121/79 (!) 139/99  Pulse: 96 88  Resp: 18 15  Temp: 99 F (37.2 C) 98 F (36.7 C)  SpO2: 100% 99%   Vitals:   11/28/18 1347 11/28/18 2120 11/28/18 2124 11/29/18 0457  BP: 111/68 121/79 121/79 (!) 139/99  Pulse: 84 97 96 88  Resp: _0 Temp: 98.7 F (37.1 C)  99 F (37.2 C) 98 F (36.7 C)  TempSrc: Oral  Oral Oral  SpO2: 99%  100% 99%  Weight:      Height:        General: Pt is alert, awake, not in acute distress Cardiovascular: RRR, S1/S2 +, no rubs, no gallops Respiratory: CTA bilaterally, no wheezing, no rhonchi Abdominal: Soft, NT, ND, bowel sounds + Extremities: no edema, no cyanosis    The results of significant  diagnostics from this hospitalization (including imaging, microbiology, ancillary and laboratory) are listed below for reference.     Microbiology: Recent Results (from the past 240 hour(s))  Aerobic/Anaerobic Culture (surgical/deep wound)     Status: None   Collection Time: 11/22/18  3:11 PM  Result Value Ref Range Status   Specimen Description   Final    WOUND Performed at [facility] [address], [place], [place] [address]    Special Requests   Final    NECK Performed at [facility], [place] [address], [place], [place] [address]    Gram Stain   Final    RARE WBC PRESENT, PREDOMINANTLY PMN MODERATE GRAM POSITIVE COCCI    Culture   Final    ABUNDANT  STAPHYLOCOCCUS AUREUS NO ANAEROBES ISOLATED Performed at [facility], [place] [address], [place], [place] [address]    Report Status 11/27/2018 FINAL  Final   Organism ID, Bacteria STAPHYLOCOCCUS AUREUS  Final      Susceptibility   Staphylococcus aureus - MIC*    CIPROFLOXACIN <=0.5 SENSITIVE Sensitive     ERYTHROMYCIN <=0.25 SENSITIVE Sensitive     GENTAMICIN <=0.5 SENSITIVE Sensitive     OXACILLIN 1 SENSITIVE Sensitive     TETRACYCLINE <=1 SENSITIVE Sensitive     VANCOMYCIN 1 SENSITIVE Sensitive     TRIMETH/SULFA <=10 SENSITIVE Sensitive     CLINDAMYCIN <=0.25 SENSITIVE Sensitive     RIFAMPIN <=0.5 SENSITIVE Sensitive     Inducible Clindamycin NEGATIVE Sensitive     * ABUNDANT STAPHYLOCOCCUS AUREUS  MRSA PCR Screening     Status: None   Collection Time: 11/22/18  5:16 PM  Result Value Ref Range Status   MRSA by PCR NEGATIVE NEGATIVE Final    Comment:        The GeneXpert MRSA Assay (FDA approved for NASAL specimens only), is one component of a comprehensive MRSA colonization surveillance program. It is not intended to diagnose MRSA infection nor to guide or monitor treatment for MRSA infections. Performed at [facility], [place] [address]  [place], [place] [address]   Culture, blood (Routine X 2) w Reflex to ID Panel     Status: None   Collection Time: 11/22/18  8:33 PM  Result Value Ref Range Status   Specimen Description   Final    BLOOD LEFT HAND Performed at [facility] [address] [place], [place] [address]    Special Requests   Final    BOTTLES DRAWN AEROBIC ONLY Blood Culture adequate volume Performed at [facility] [address] [place], [place] [address]    Culture   Final    NO GROWTH 5 DAYS Performed at [facility], [place] [address], [place], [place] [address]    Report Status 11/27/2018 FINAL  Final  Culture, blood (Routine X 2) w Reflex to ID Panel     Status: None   Collection Time: 11/22/18  8:33 PM  Result Value Ref Range Status   Specimen Description   Final    BLOOD RIGHT HAND Performed at [facility] [address] [place], [place] [address]    Special Requests   Final    BOTTLES DRAWN AEROBIC ONLY Blood Culture adequate volume Performed at [facility] [address], [place], [place] [address]    Culture   Final    NO GROWTH 5 DAYS Performed at [facility], [place] [address] [place], [place] [address]    Report Status 11/27/2018 FINAL  Final     Labs: BNP (last 3 results) No results for input(s): BNP in the last 8760 hours. Basic Metabolic Panel: Recent Labs  Lab 11/24/18 0420 11/25/18 0525 11/26/18 0530 11/27/18 0433 11/29/18 0539  NA 133* 135 137 137 136  K 3.6 3.4* 3.2* 3.2* 4.3  CL 108 105 105 105 101  CO2 18* 20* _0 GLUCOSE 192* 191* 115* 130* 159*  BUN _1 CREATININE 0.63 0.65 0.71 0.76 0.72  CALCIUM 8.0* 8.0* 8.2* 7.9* 8.3*  MG  --  1.9 1.8 1.9 2.1   Liver Function Tests: No results for input(s): AST, ALT, ALKPHOS, BILITOT, PROT, ALBUMIN in the last 168  hours. No results for input(s): LIPASE, AMYLASE in the last 168 hours. No results for input(s): AMMONIA in  the last 168 hours. CBC: Recent Labs  Lab 11/24/18 0420 11/25/18 0525 11/26/18 0530 11/27/18 0433 11/29/18 0539  WBC 14.6* 12.6* 13.6* 11.5* 8.8  NEUTROABS 12.4*  --   --   --   --   HGB 11.3* 11.7* 12.0* 9.2* 12.1*  HCT 34.8* 36.5* 35.8* 28.7* 37.5*  MCV 89.0 89.0 86.5 88.9 89.5  PLT 265 285 356 433* 532*   Cardiac Enzymes: Recent Labs  Lab 11/22/18 1608  TROPONINI <0.03   BNP: Invalid input(s): POCBNP CBG: Recent Labs  Lab 11/28/18 0841 11/28/18 1128 11/28/18 1651 11/29/18 0457 11/29/18 0726  GLUCAP 152* 174* 213* 137* 160*   D-Dimer No results for input(s): DDIMER in the last 72 hours. Hgb A1c No results for input(s): HGBA1C in the last 72 hours. Lipid Profile No results for input(s): CHOL, HDL, LDLCALC, TRIG, CHOLHDL, LDLDIRECT in the last 72 hours. Thyroid function studies No results for input(s): TSH, T4TOTAL, T3FREE, THYROIDAB in the last 72 hours.  Invalid input(s): FREET3 Anemia work up No results for input(s): VITAMINB12, FOLATE, FERRITIN, TIBC, IRON, RETICCTPCT in the last 72 hours. Urinalysis    Component Value Date/Time   COLORURINE STRAW (A) 11/22/2018 1049   APPEARANCEUR CLEAR 11/22/2018 1049   LABSPEC 1.023 11/22/2018 1049   PHURINE 5.0 11/22/2018 1049   GLUCOSEU >=500 (A) 11/22/2018 1049   HGBUR MODERATE (A) 11/22/2018 1049   BILIRUBINUR NEGATIVE 11/22/2018 1049   KETONESUR 80 (A) 11/22/2018 1049   PROTEINUR NEGATIVE 11/22/2018 1049   NITRITE NEGATIVE 11/22/2018 1049   LEUKOCYTESUR NEGATIVE 11/22/2018 1049   Sepsis Labs Invalid input(s): PROCALCITONIN,  WBC,  LACTICIDVEN Microbiology Recent Results (from the past 240 hour(s))  Aerobic/Anaerobic Culture (surgical/deep wound)     Status: None   Collection Time: 11/22/18  3:11 PM  Result Value Ref Range Status   Specimen Description   Final    WOUND Performed at [facility], [place] [address], [place], [place] [address]    Special Requests   Final     NECK Performed at [facility], [place] [address], [place], [place] [address]    Gram Stain   Final    RARE WBC PRESENT, PREDOMINANTLY PMN MODERATE GRAM POSITIVE COCCI    Culture   Final    ABUNDANT STAPHYLOCOCCUS AUREUS NO ANAEROBES ISOLATED Performed at [facility], [place] [address] [place], [place] [address]    Report Status 11/27/2018 FINAL  Final   Organism ID, Bacteria STAPHYLOCOCCUS AUREUS  Final      Susceptibility   Staphylococcus aureus - MIC*    CIPROFLOXACIN <=0.5 SENSITIVE Sensitive     ERYTHROMYCIN <=0.25 SENSITIVE Sensitive     GENTAMICIN <=0.5 SENSITIVE Sensitive     OXACILLIN 1 SENSITIVE Sensitive     TETRACYCLINE <=1 SENSITIVE Sensitive     VANCOMYCIN 1 SENSITIVE Sensitive     TRIMETH/SULFA <=10 SENSITIVE Sensitive     CLINDAMYCIN <=0.25 SENSITIVE Sensitive     RIFAMPIN <=0.5 SENSITIVE Sensitive     Inducible Clindamycin NEGATIVE Sensitive     * ABUNDANT STAPHYLOCOCCUS AUREUS  MRSA PCR Screening     Status: None   Collection Time: 11/22/18  5:16 PM  Result Value Ref Range Status   MRSA by PCR NEGATIVE NEGATIVE Final    Comment:        The GeneXpert MRSA Assay (FDA approved for NASAL specimens only), is  one component of a comprehensive MRSA colonization surveillance program. It is not intended to diagnose MRSA infection nor to guide or monitor treatment for MRSA infections. Performed at [facility], [place] [address] [place], [place] [address]   Culture, blood (Routine X 2) w Reflex to ID Panel     Status: None   Collection Time: 11/22/18  8:33 PM  Result Value Ref Range Status   Specimen Description   Final    BLOOD LEFT HAND Performed at [facility] [address] [place], [place] [address]    Special Requests   Final    BOTTLES DRAWN AEROBIC ONLY Blood Culture adequate volume Performed at [facility] [address], [place], [place] [address]    Culture   Final     NO GROWTH 5 DAYS Performed at [facility], [place] [address], [place], [place] [address]    Report Status 11/27/2018 FINAL  Final  Culture, blood (Routine X 2) w Reflex to ID Panel     Status: None   Collection Time: 11/22/18  8:33 PM  Result Value Ref Range Status   Specimen Description   Final    BLOOD RIGHT HAND Performed at [facility] [address], [place], [place] [address]    Special Requests   Final    BOTTLES DRAWN AEROBIC ONLY Blood Culture adequate volume Performed at [facility] [address], [place], [place] [address]    Culture   Final    NO GROWTH 5 DAYS Performed at [facility], [place] [address] [place] [address] [place],  [address]    Report Status 11/27/2018 FINAL  Final     Time coordinating discharge: Over 30 minutes  SIGNED:   Donnamarie Poag [place] DO  [facility] 11/29/2018, 10:56 AM

## 2018-12-02 LAB — GLUCOSE, CAPILLARY
Glucose-Capillary: 188 mg/dL — ABNORMAL HIGH (ref 70–99)
Glucose-Capillary: 211 mg/dL — ABNORMAL HIGH (ref 70–99)

## 2018-12-06 ENCOUNTER — Ambulatory Visit: Payer: Self-pay | Attending: Family Medicine | Admitting: Family Medicine

## 2018-12-06 ENCOUNTER — Other Ambulatory Visit: Payer: Self-pay

## 2018-12-06 ENCOUNTER — Other Ambulatory Visit: Payer: Self-pay | Admitting: Pharmacist

## 2018-12-06 DIAGNOSIS — L0211 Cutaneous abscess of neck: Secondary | ICD-10-CM

## 2018-12-06 DIAGNOSIS — I10 Essential (primary) hypertension: Secondary | ICD-10-CM

## 2018-12-06 DIAGNOSIS — Z794 Long term (current) use of insulin: Secondary | ICD-10-CM

## 2018-12-06 DIAGNOSIS — E1169 Type 2 diabetes mellitus with other specified complication: Secondary | ICD-10-CM

## 2018-12-06 MED ORDER — INSULIN ASPART 100 UNIT/ML FLEXPEN: PEN_INJECTOR | SUBCUTANEOUS | 11 refills | Status: DC

## 2018-12-06 MED ORDER — CYCLOBENZAPRINE HCL 10 MG PO TABS: 10.0000 mg | ORAL_TABLET | Freq: Two times a day (BID) | ORAL | 1 refills | Status: DC | PRN

## 2018-12-06 MED ORDER — METOPROLOL TARTRATE 50 MG PO TABS: 50.0000 mg | ORAL_TABLET | Freq: Two times a day (BID) | ORAL | 1 refills | Status: DC

## 2018-12-06 MED ORDER — INSULIN LISPRO (1 UNIT DIAL) 100 UNIT/ML (KWIKPEN): PEN_INJECTOR | SUBCUTANEOUS | 11 refills | Status: DC

## 2018-12-06 MED ORDER — INSULIN DETEMIR 100 UNIT/ML FLEXPEN: 20.0000 [IU] | Freq: Every day | SUBCUTANEOUS | 3 refills | Status: DC

## 2018-12-06 MED ORDER — INSULIN DETEMIR 100 UNIT/ML ~~LOC~~ SOLN: 20.0000 [IU] | Freq: Every day | SUBCUTANEOUS | 3 refills | Status: DC

## 2018-12-06 MED FILL — CYCLOBENZAPRINE 10 MG TAB: 10 | 30 days supply | Qty: 60 | Fill #0

## 2018-12-06 NOTE — Progress Notes (Signed)
Virtual Visit via Telephone Note  I connected with Taylor Hughes, on 12/06/2018 at 9:18 AM by telephone and verified that I am speaking with the correct person using two identifiers.   Consent: I discussed the limitations, risks, security and privacy concerns of performing an evaluation and management service by telephone and the availability of in person appointments. I also discussed with the patient that there may be a patient responsible charge related to this service. The patient expressed understanding and agreed to proceed.   Location of Patient: Home  Location of Provider: Clinic   Persons participating in Telemedicine visit: Ibrahem Volkman - CMA Dr Margarita Rana - PCP     History of Present Illness: Taylor Hughes is a 48 year old male with a history of type 2 diabetes mellitus (A1c of 11.4 from 11/2018), L fifth toe ray amputation, hypertension who is establishing care today after hospitalization at [facility] from 11/21/2020 11/29/2018 for DKA and neck abscess. He had presented with left-sided neck pain which he thought was related to a musculoskeletal injury during gardening but had denied trauma to his neck.  Neck CT revealed:  IMPRESSION: Phlegmonous inflammatory change at the thoracic inlet anteriorly, more to the left, measuring about 6 cm in diameter with a few tiny air bubbles. Asymmetric enlargement of the left pectoralis muscle suggesting myositis. Often, infection in this region will arise from the sternoclavicular joint, but I cannot establish that in this Case.  He underwent incision and drainage of left neck abscess and was placed on IV antibiotics.  DKA was treated with IV fluids and IV insulin. He was subsequently discharged to follow-up with ENT.  3 days ago he had a follow-up with ENT and home dressing changes recommended with a follow-up in 2 weeks.  He informs me the abscess has improved significantly and he is almost done with his  course of Bactrim. He was previously followed by his PCP -Dr.Roy Mellody Drown but underwent separation and divorce with resulting financial difficulties which caused him to be noncompliant and nonchalant about his medical care but he is ready to get back on track now. He does have a left foot ulcer from fifth ray amputation which was followed by orthopedics, Dr. Sharol Given with his last visit in 09/2018 and this has healed. His fasting sugars have been in the 145-160 range and he denies hypoglycemia.   Past Medical History:  Diagnosis Date  . Diabetes mellitus without complication ([place])   . Wears glasses   . Wound, open, foot    left diabetic    No Known Allergies  Current Outpatient Medications on File Prior to Visit  Medication Sig Dispense Refill  . aspirin EC 81 MG tablet Take 1 tablet (81 mg total) by mouth daily. 30 tablet 11  . blood glucose meter kit and supplies KIT Dispense based on patient and insurance preference. Use up to four times daily as directed. (FOR ICD-9 250.00, 250.01). 1 each 0  . HYDROcodone-acetaminophen (NORCO) 7.5-325 MG tablet TK 1 T PO Q 6 H PRN FOR MODERATE PAIN    . insulin detemir (LEVEMIR) 100 unit/ml SOLN Inject 0.2 mLs (20 Units total) into the skin daily for 30 days. 6 mL 0  . Insulin Pen Needle 32G X 5 MM MISC Use with levemir pen as prescribed 100 each 0  . metoprolol tartrate (LOPRESSOR) 50 MG tablet Take 1 tablet (50 mg total) by mouth 2 (two) times daily for 30 days. 60 tablet 0  . pantoprazole (PROTONIX) 40  MG tablet Take 1 tablet (40 mg total) by mouth daily. 30 tablet 0  . sulfamethoxazole-trimethoprim (BACTRIM DS) 800-160 MG tablet Take 1 tablet by mouth 2 (two) times daily for 10 days. 20 tablet 0   No current facility-administered medications on file prior to visit.     Observations/Objective: Awake, alert , oriented x3 Not in acute distress  CMP Latest Ref Rng & Units 11/29/2018 11/27/2018 11/26/2018  Glucose 70 - 99 mg/dL 159(H) 130(H) 115(H)  BUN  6 - 20 mg/dL 9 9 7   Creatinine 0.61 - 1.24 mg/dL 0.72 0.76 0.71  Sodium 135 - 145 mmol/L 136 137 137  Potassium 3.5 - 5.1 mmol/L 4.3 3.2(L) 3.2(L)  Chloride 98 - 111 mmol/L 101 105 105  CO2 22 - 32 mmol/L 28 23 23   Calcium 8.9 - 10.3 mg/dL 8.3(L) 7.9(L) 8.2(L)  Total Protein 6.5 - 8.1 g/dL - - -  Total Bilirubin 0.3 - 1.2 mg/dL - - -  Alkaline Phos 38 - 126 U/L - - -  AST 15 - 41 U/L - - -  ALT 0 - 44 U/L - - -    Lab Results  Component Value Date   HGBA1C 11.4 (H) 11/25/2018    Assessment and Plan: 1. Essential hypertension Advised to keep blood pressure log Counseled on blood pressure goal of less than 130/80, low-sodium, DASH diet, medication compliance, 150 minutes of moderate intensity exercise per week. Discussed medication compliance, adverse effects.  2. Neck abscess Improving Continue dressing change Followed by ENT - Dr Redmond Baseman - cyclobenzaprine (FLEXERIL) 10 MG tablet; Take 1 tablet (10 mg total) by mouth 2 (two) times daily as needed for muscle spasms.  Dispense: 60 tablet; Refill: 1  3. Type 2 diabetes mellitus with other specified complication, with long-term current use of insulin (HCC) Uncontrolled with A1c of 11.4 due to previous non compliance Current blood sugars reveal improvement Continue sliding scale in addition to Levemir Discussed management of Hypoglycemia. Counseled on Diabetic diet, my plate method, 503 minutes of moderate intensity exercise/week Keep blood sugar logs with fasting goals of 80-120 mg/dl, random of less than 180 and in the event of sugars less than 60 mg/dl or greater than 400 mg/dl please notify the clinic ASAP. It is recommended that you undergo annual eye exams and annual foot exams. Pneumonia vaccine is recommended. - insulin aspart (NOVOLOG FLEXPEN) 100 UNIT/ML FlexPen; 0-12 units three times daily before meals according to sliding scale  Dispense: 15 mL; Refill: 11 - metoprolol tartrate (LOPRESSOR) 50 MG tablet; Take 1 tablet (50  mg total) by mouth 2 (two) times daily for 30 days.  Dispense: 180 tablet; Refill: 1 - insulin detemir (LEVEMIR) 100 UNIT/ML injection; Inject 0.2 mLs (20 Units total) into the skin daily.  Dispense: 30 mL; Refill: 3   Follow Up Instructions: Return in about 1 month (around 01/05/2019) for Follow-up of chronic medical conditions.     I discussed the assessment and treatment plan with the patient. The patient was provided an opportunity to ask questions and all were answered. The patient agreed with the plan and demonstrated an understanding of the instructions.   The patient was advised to call back or seek an in-person evaluation if the symptoms worsen or if the condition fails to improve as anticipated.     I provided 26 minutes total of non-face-to-face time during this encounter including median intraservice time, reviewing previous notes, labs, imaging, medications and explaining diagnosis and management.     Charlott Rakes, MD,  FAAFP. [facility] and [place] [place], [place]   12/06/2018, 9:18 AM

## 2018-12-06 NOTE — Progress Notes (Signed)
Patient has been called and DOB has been verified. Patient has been screened and transferred to PCP to start phone visit.  C/C: HFU   

## 2018-12-07 ENCOUNTER — Encounter: Payer: Self-pay | Admitting: Family Medicine

## 2018-12-17 ENCOUNTER — Encounter (HOSPITAL_COMMUNITY): Payer: Self-pay | Admitting: Emergency Medicine

## 2018-12-17 ENCOUNTER — Other Ambulatory Visit: Payer: Self-pay

## 2018-12-17 ENCOUNTER — Emergency Department (HOSPITAL_COMMUNITY): Payer: Self-pay

## 2018-12-17 ENCOUNTER — Observation Stay (HOSPITAL_COMMUNITY): Payer: Self-pay

## 2018-12-17 ENCOUNTER — Observation Stay (HOSPITAL_COMMUNITY)
Admission: EM | Admit: 2018-12-17 | Discharge: 2018-12-18 | Disposition: A | Discharge status: Home / Self Care | Payer: Self-pay | Attending: Family Medicine | Admitting: Family Medicine

## 2018-12-17 DIAGNOSIS — W19XXXA Unspecified fall, initial encounter: Secondary | ICD-10-CM | POA: Insufficient documentation

## 2018-12-17 DIAGNOSIS — I10 Essential (primary) hypertension: Secondary | ICD-10-CM | POA: Insufficient documentation

## 2018-12-17 DIAGNOSIS — E11649 Type 2 diabetes mellitus with hypoglycemia without coma: Principal | ICD-10-CM | POA: Insufficient documentation

## 2018-12-17 DIAGNOSIS — T383X5A Adverse effect of insulin and oral hypoglycemic [antidiabetic] drugs, initial encounter: Secondary | ICD-10-CM | POA: Insufficient documentation

## 2018-12-17 DIAGNOSIS — L0211 Cutaneous abscess of neck: Secondary | ICD-10-CM | POA: Insufficient documentation

## 2018-12-17 DIAGNOSIS — S0003XA Contusion of scalp, initial encounter: Secondary | ICD-10-CM | POA: Insufficient documentation

## 2018-12-17 DIAGNOSIS — Z1159 Encounter for screening for other viral diseases: Secondary | ICD-10-CM | POA: Insufficient documentation

## 2018-12-17 DIAGNOSIS — Z7982 Long term (current) use of aspirin: Secondary | ICD-10-CM | POA: Insufficient documentation

## 2018-12-17 DIAGNOSIS — R000 Tachycardia, unspecified: Secondary | ICD-10-CM | POA: Insufficient documentation

## 2018-12-17 DIAGNOSIS — E876 Hypokalemia: Secondary | ICD-10-CM | POA: Insufficient documentation

## 2018-12-17 DIAGNOSIS — D649 Anemia, unspecified: Secondary | ICD-10-CM | POA: Insufficient documentation

## 2018-12-17 DIAGNOSIS — Z794 Long term (current) use of insulin: Secondary | ICD-10-CM | POA: Insufficient documentation

## 2018-12-17 DIAGNOSIS — Z79899 Other long term (current) drug therapy: Secondary | ICD-10-CM | POA: Insufficient documentation

## 2018-12-17 DIAGNOSIS — E160 Drug-induced hypoglycemia without coma: Secondary | ICD-10-CM

## 2018-12-17 DIAGNOSIS — G9341 Metabolic encephalopathy: Secondary | ICD-10-CM | POA: Diagnosis present

## 2018-12-17 DIAGNOSIS — T68XXXA Hypothermia, initial encounter: Secondary | ICD-10-CM | POA: Insufficient documentation

## 2018-12-17 DIAGNOSIS — E162 Hypoglycemia, unspecified: Secondary | ICD-10-CM

## 2018-12-17 DIAGNOSIS — G9340 Encephalopathy, unspecified: Secondary | ICD-10-CM | POA: Insufficient documentation

## 2018-12-17 DIAGNOSIS — D473 Essential (hemorrhagic) thrombocythemia: Secondary | ICD-10-CM | POA: Insufficient documentation

## 2018-12-17 LAB — CBC WITH DIFFERENTIAL/PLATELET
Abs Immature Granulocytes: 0.03 10*3/uL (ref 0.00–0.07)
Basophils Absolute: 0 10*3/uL (ref 0.0–0.1)
Basophils Relative: 0 %
Eosinophils Absolute: 0 10*3/uL (ref 0.0–0.5)
Eosinophils Relative: 0 %
HCT: 32.4 % — ABNORMAL LOW (ref 39.0–52.0)
Hemoglobin: 10.3 g/dL — ABNORMAL LOW (ref 13.0–17.0)
Immature Granulocytes: 0 %
Lymphocytes Relative: 9 %
Lymphs Abs: 0.8 10*3/uL (ref 0.7–4.0)
MCH: 28.2 pg (ref 26.0–34.0)
MCHC: 31.8 g/dL (ref 30.0–36.0)
MCV: 88.8 fL (ref 80.0–100.0)
Monocytes Absolute: 0.8 10*3/uL (ref 0.1–1.0)
Monocytes Relative: 9 %
Neutro Abs: 7.5 10*3/uL (ref 1.7–7.7)
Neutrophils Relative %: 82 %
Platelets: 443 10*3/uL — ABNORMAL HIGH (ref 150–400)
RBC: 3.65 MIL/uL — ABNORMAL LOW (ref 4.22–5.81)
RDW: 15.2 % (ref 11.5–15.5)
WBC: 9.1 10*3/uL (ref 4.0–10.5)
nRBC: 0 % (ref 0.0–0.2)

## 2018-12-17 LAB — COMPREHENSIVE METABOLIC PANEL
ALT: 10 U/L (ref 0–44)
AST: 16 U/L (ref 15–41)
Albumin: 2.9 g/dL — ABNORMAL LOW (ref 3.5–5.0)
Alkaline Phosphatase: 60 U/L (ref 38–126)
Anion gap: 10 (ref 5–15)
BUN: 17 mg/dL (ref 6–20)
CO2: 24 mmol/L (ref 22–32)
Calcium: 9 mg/dL (ref 8.9–10.3)
Chloride: 102 mmol/L (ref 98–111)
Creatinine, Ser: 0.78 mg/dL (ref 0.61–1.24)
GFR calc Af Amer: >60 mL/min (ref >60)
GFR calc non Af Amer: >60 mL/min (ref >60)
Glucose, Bld: 87 mg/dL (ref 70–99)
Potassium: 3.2 mmol/L — ABNORMAL LOW (ref 3.5–5.1)
Sodium: 136 mmol/L (ref 135–145)
Total Bilirubin: 0.3 mg/dL (ref 0.3–1.2)
Total Protein: 8.3 g/dL — ABNORMAL HIGH (ref 6.5–8.1)

## 2018-12-17 LAB — CBG MONITORING, ED
Glucose-Capillary: 105 mg/dL — ABNORMAL HIGH (ref 70–99)
Glucose-Capillary: 99 mg/dL (ref 70–99)
Glucose-Capillary: 76 mg/dL (ref 70–99)

## 2018-12-17 LAB — POCT I-STAT EG7
Acid-Base Excess: 2 mmol/L (ref 0.0–2.0)
Bicarbonate: 26.9 mmol/L (ref 20.0–28.0)
Calcium, Ion: 1.21 mmol/L (ref 1.15–1.40)
HCT: 32 % — ABNORMAL LOW (ref 39.0–52.0)
Hemoglobin: 10.9 g/dL — ABNORMAL LOW (ref 13.0–17.0)
O2 Saturation: 81 %
Potassium: 3.6 mmol/L (ref 3.5–5.1)
Sodium: 138 mmol/L (ref 135–145)
TCO2: 28 mmol/L (ref 22–32)
pCO2, Ven: 43.8 mmHg — ABNORMAL LOW (ref 44.0–60.0)
pH, Ven: 7.397 (ref 7.250–7.430)
pO2, Ven: 46 mmHg — ABNORMAL HIGH (ref 32.0–45.0)

## 2018-12-17 LAB — TROPONIN I: Troponin I: <0.03 ng/mL (ref <0.03)

## 2018-12-17 LAB — TSH: TSH: 3.625 u[IU]/mL (ref 0.350–4.500)

## 2018-12-17 LAB — CK: Total CK: 130 U/L (ref 49–397)

## 2018-12-17 LAB — I-STAT CREATININE, ED: Creatinine, Ser: 0.7 mg/dL (ref 0.61–1.24)

## 2018-12-17 MED ORDER — SODIUM CHLORIDE 0.9 % IV BOLUS: 1000.0000 mL | Freq: Once | INTRAVENOUS | Status: DC

## 2018-12-17 MED ORDER — POTASSIUM CHLORIDE CRYS ER 20 MEQ PO TBCR
40.0000 meq | EXTENDED_RELEASE_TABLET | Freq: Once | ORAL | Status: AC
  Administered 2018-12-18: 40 meq via ORAL
  Filled 2018-12-17: qty 2

## 2018-12-17 MED ORDER — DEXTROSE 5 % IV SOLN
Freq: Once | INTRAVENOUS | Status: AC
  Administered 2018-12-17: via INTRAVENOUS

## 2018-12-17 MED ORDER — SODIUM CHLORIDE (PF) 0.9 % IJ SOLN
INTRAMUSCULAR | Status: AC
  Administered 2018-12-17
  Filled 2018-12-17: qty 50

## 2018-12-17 MED ORDER — IOHEXOL 350 MG/ML SOLN
100.0000 mL | Freq: Once | INTRAVENOUS | Status: AC | PRN
  Administered 2018-12-17: 100 mL via INTRAVENOUS

## 2018-12-17 MED ORDER — SODIUM CHLORIDE 0.9 % IV BOLUS
1000.0000 mL | Freq: Once | INTRAVENOUS | Status: AC
  Administered 2018-12-17: 1000 mL via INTRAVENOUS

## 2018-12-17 NOTE — ED Notes (Signed)
Bed: UY40 Expected date:  Expected time:  Means of arrival:  Comments: Ems [place] cbg 40-Glucagon-no response-wife had given patient 7 units of insulin prior to EMS arrival-D10 currently hanging

## 2018-12-17 NOTE — ED Triage Notes (Signed)
Patient BIB [facility] from home for hypoglycemia. Pts wife stated pt wasn't acting "right" and gave him 7 units of insulin without checking his cbg. Then called EMS. Pt CBG with fire dept was 40. EMS attempted to get pt to eat and take glucose packets, pt then became unresponsive with EMS, EMS gave glucagon IM and started D10 iv. After approximatly 10 minutes pt became a/o x4 and cbg came up to 150.

## 2018-12-17 NOTE — ED Notes (Signed)
Pt provided 2 orange juices with 2 sugar packets.

## 2018-12-17 NOTE — ED Provider Notes (Signed)
[facility] DEPT Provider Note   CSN: [id number] Arrival date & time: 12/17/18  2028    History   Chief Complaint Chief Complaint  Patient presents with   Hypoglycemia    HPI Taylor Hughes is a 48 y.o. male hx of DM, recent sternomanubrial abscess here presenting with hypoglycemia.  Patient was noted to be altered today and wife thought his sugar was elevated so gave him 7 units of insulin.  Patient was noted to be hypoglycemic to 40 per EMS.  Apparently he was outside and was walking with EMS and fell and hit his head.  Patient did not remember what happened.  He states that he did not eat much today but denies any fevers or chills.  Patient states that he was recently admitted and had a abscess in his chest that required I&D by ENT.  Patient still has dressing in the sternal area that has been changed.  He denies any purulent discharge, just serosanguineous. Patient was given glucagon and D10 by EMS.      The history is provided by the patient.    Past Medical History:  Diagnosis Date   Diabetes mellitus without complication ([place])    Wears glasses    Wound, open, foot    left diabetic     Patient Active Problem List   Diagnosis Date Noted   Hypoglycemia due to insulin 12/18/2018   Acute encephalopathy 12/17/2018   Neck abscess 11/23/2018   Essential hypertension 11/23/2018   AKI (acute kidney injury) ([place]) 11/23/2018   Obesity (BMI 30.0-34.9) 11/23/2018   Type 2 diabetes mellitus with other specified complication ([place]) 68/07/7516   Sepsis due to group B Streptococcus ([place])    Diabetic infection of left foot ([place])    Left foot infection    DKA, type 2 ([place]) 07/28/2018   Leukocytosis 07/28/2018   Hyperkalemia 07/28/2018   Hyponatremia 07/28/2018    Past Surgical History:  Procedure Laterality Date   AMPUTATION Left 08/20/2018   Procedure: LEFT FOOT IRRIGATON AND DEBRIDEMENT, 5TH RAY AMPUTATION;  Surgeon: Newt Minion,  MD;  Location: [facility];  Service: Orthopedics;  Laterality: Left;   I&D EXTREMITY Left 07/30/2018   Procedure: IRRIGATION AND DEBRIDEMENT LEFT FOOT WITH POSSIBLE AMPUTATION OF FIFTH TOE;  Surgeon: Mcarthur Rossetti, MD;  Location: [facility];  Service: Orthopedics;  Laterality: Left;   MINOR IRRIGATION AND DEBRIDEMENT OF WOUND N/A 11/22/2018   Procedure: INCISION AND DRAINAGE OF NECK ABSCESS;  Surgeon: Melida Quitter, MD;  Location: [facility];  Service: ENT;  Laterality: N/A;        Home Medications    Prior to Admission medications   Medication Sig Start Date End Date Taking? Authorizing Provider  aspirin EC 81 MG tablet Take 1 tablet (81 mg total) by mouth daily. 11/29/18 11/29/19  [place] Eric J, DO  blood glucose meter kit and supplies KIT Dispense based on patient and insurance preference. Use up to four times daily as directed. (FOR ICD-9 250.00, 250.01). 11/29/18   [place] Donnamarie Poag, DO  cyclobenzaprine (FLEXERIL) 10 MG tablet Take 1 tablet (10 mg total) by mouth 2 (two) times daily as needed for muscle spasms. 12/06/18   Charlott Rakes, MD  HYDROcodone-acetaminophen (NORCO) 7.5-325 MG tablet TK 1 T PO Q 6 H PRN FOR MODERATE PAIN 11/29/18   [provider]  insulin aspart (NOVOLOG FLEXPEN) 100 UNIT/ML FlexPen 0-12 units three times daily before meals according to sliding scale 12/06/18   Charlott Rakes, MD  insulin detemir (LEVEMIR) 100 UNIT/ML injection Inject 0.2 mLs (20 Units total) into the skin daily. 12/06/18   Charlott Rakes, MD  insulin lispro (HUMALOG KWIKPEN) 100 UNIT/ML KwikPen 0 - 12 units TID before meals according to sliding scale. 12/06/18   Charlott Rakes, MD  Insulin Pen Needle 32G X 5 MM MISC Use with levemir pen as prescribed 11/29/18   [place] Donnamarie Poag, DO  metoprolol tartrate (LOPRESSOR) 50 MG tablet Take 1 tablet (50 mg total) by mouth 2 (two) times daily for 30 days. 12/06/18 01/05/19  Charlott Rakes, MD  pantoprazole (PROTONIX) 40 MG tablet Take 1 tablet (40 mg  total) by mouth daily. 11/30/18   [place] Eric J, DO    Family History Family History  Problem Relation Age of Onset   Diabetes Mother     Social History Social History   Tobacco Use   Smoking status: Never Smoker   Smokeless tobacco: Never Used  Substance Use Topics   Alcohol use: No    Alcohol/week: 0.0 standard drinks   Drug use: No     Allergies   Patient has no known allergies.   Review of Systems Review of Systems  Skin: Positive for wound.  Neurological: Positive for dizziness.  All other systems reviewed and are negative.    Physical Exam Updated Vital Signs BP (!) 146/92    Pulse (!) 127    Temp (!) 97.4 F (36.3 C) (Oral)    Resp 20    Ht 5' 11"  (1.803 m)    Wt 124.7 kg    SpO2 98%    BMI 38.35 kg/m   Physical Exam Vitals signs and nursing note reviewed.  HENT:     Head: Normocephalic.     Comments: No obvious scalp hematoma     Mouth/Throat:     Mouth: Mucous membranes are dry.  Eyes:     Extraocular Movements: Extraocular movements intact.     Pupils: Pupils are equal, round, and reactive to light.  Neck:     Musculoskeletal: Normal range of motion.  Cardiovascular:     Comments: Tachycardic  Pulmonary:     Effort: Pulmonary effort is normal.     Breath sounds: Normal breath sounds.     Comments: There is a sound in the upper sternal area with serosanguinous drainage, no obvious surrounding cellulitis  Musculoskeletal: Normal range of motion.  Skin:    General: Skin is warm.     Capillary Refill: Capillary refill takes less than 2 seconds.  Neurological:     General: No focal deficit present.     Mental Status: He is alert.  Psychiatric:        Mood and Affect: Mood normal.      ED Treatments / Results  Labs (all labs ordered are listed, but only abnormal results are displayed) Labs Reviewed  CBC WITH DIFFERENTIAL/PLATELET - Abnormal; Notable for the following components:      Result Value   RBC 3.65 (*)    Hemoglobin 10.3  (*)    HCT 32.4 (*)    Platelets 443 (*)    All other components within normal limits  COMPREHENSIVE METABOLIC PANEL - Abnormal; Notable for the following components:   Potassium 3.2 (*)    Total Protein 8.3 (*)    Albumin 2.9 (*)    All other components within normal limits  CBG MONITORING, ED - Abnormal; Notable for the following components:   Glucose-Capillary 105 (*)    All other components within  normal limits  POCT I-STAT EG7 - Abnormal; Notable for the following components:   pCO2, Ven 43.8 (*)    pO2, Ven 46.0 (*)    HCT 32.0 (*)    Hemoglobin 10.9 (*)    All other components within normal limits  SARS CORONAVIRUS 2 (HOSPITAL ORDER, [facility])  CK  TROPONIN I  TSH  URINALYSIS, ROUTINE W REFLEX MICROSCOPIC  BASIC METABOLIC PANEL  MAGNESIUM  CBC WITH DIFFERENTIAL/PLATELET  I-STAT CREATININE, ED  CBG MONITORING, ED  CBG MONITORING, ED  CBG MONITORING, ED    EKG EKG Interpretation  Date/Time:  Friday Dec 17 2018 20:51:30 EDT Ventricular Rate:  123 PR Interval:    QRS Duration: 82 QT Interval:  303 QTC Calculation: 434 R Axis:   45 Text Interpretation:  Sinus tachycardia Borderline T wave abnormalities rate faster than previous  Confirmed by Wandra Arthurs [phone]) on 12/17/2018 9:02:30 PM   Radiology Ct Head Wo Contrast  Result Date: 12/17/2018 CLINICAL DATA:  48 y/o M; fall with head and neck trauma. Recent neck surgery. EXAM: CT HEAD WITHOUT CONTRAST CT CERVICAL SPINE WITHOUT CONTRAST TECHNIQUE: Multidetector CT imaging of the head and cervical spine was performed following the standard protocol without intravenous contrast. Multiplanar CT image reconstructions of the cervical spine were also generated. COMPARISON:  11/22/2018 CT neck. 08/04/2006 CT head and cervical spine. FINDINGS: CT HEAD FINDINGS Brain: No evidence of acute infarction, hemorrhage, hydrocephalus, extra-axial collection or mass lesion/mass effect. Vascular: No hyperdense  vessel or unexpected calcification. Skull: Mild scalp thickening in the left parietal region, probably a small contusion. No calvarial fracture. Sinuses/Orbits: No acute finding. Other: None. CT CERVICAL SPINE FINDINGS Alignment: Mild reversal of cervical curvature with apex at C4-5. Skull base and vertebrae: No acute fracture. No primary bone lesion or focal pathologic process. Soft tissues and spinal canal: No prevertebral fluid or swelling. No visible canal hematoma. Disc levels: Mild-to-moderate discogenic degenerative changes at C5-C6 with loss of intervertebral disc space height and endplate marginal osteophytes. No high-grade bony foraminal or spinal canal stenosis. Upper chest: Negative. Other: Partially visualize is a soft tissue defect containing debris, possibly packing material, in the midline upper anterior chest wall, likely related to recent debridement. Suboptimal assessment of soft tissues in the absence of intravenous contrast. IMPRESSION: 1. Mild scalp thickening in the left parietal region, probably a small contusion. No calvarial fracture. 2. No acute intracranial abnormality. 3. No acute fracture or dislocation of the cervical spine. 4. Mild-to-moderate discogenic degenerative changes at C5-C6. 5. Partially visualize is a soft tissue defect containing debris, possibly packing material, in the midline upper anterior chest wall, likely related to recent debridement. Suboptimal assessment of soft tissues in the absence of intravenous contrast. Electronically Signed   By: Kristine Garbe M.D.   On: 12/17/2018 23:56   Ct Angio Chest Pe W And/or Wo Contrast  Result Date: 12/17/2018 CLINICAL DATA:  48 year old male with fall. Recent neck surgery with drainage. EXAM: CT ANGIOGRAPHY CHEST WITH CONTRAST TECHNIQUE: Multidetector CT imaging of the chest was performed using the standard protocol during bolus administration of intravenous contrast. Multiplanar CT image reconstructions and MIPs  were obtained to evaluate the vascular anatomy. CONTRAST:  151m OMNIPAQUE IOHEXOL 350 MG/ML SOLN COMPARISON:  Chest radiograph dated 12/17/2018 FINDINGS: Evaluation of this exam is limited due to respiratory motion artifact. Cardiovascular: Top-normal cardiac size. No pericardial effusion. Coronary vascular calcifications involving the LAD and RCA. The thoracic aorta is unremarkable. The central pulmonary arteries are  grossly unremarkable. Mediastinum/Nodes: No hilar or mediastinal adenopathy. The esophagus and the thyroid gland are grossly unremarkable. Lungs/Pleura: There are bibasilar linear atelectasis/scarring. Minimal left lung base subpleural atelectasis. No consolidative changes. There is no pleural effusion or pneumothorax. Central airways are patent. Upper Abdomen: No acute abnormality. Musculoskeletal: Old healed right posterior rib fractures. No acute osseous pathology. There is enlargement of the left pectoralis musculature with low attenuating content most consistent with intramuscular hematoma. There is extension of the hematoma in the superior portion of the anterior mediastinum. There is diffuse subcutaneous edema of the anterior chest wall. Review of the MIP images confirms the above findings. IMPRESSION: Findings most consistent with intramuscular hematoma involving the left pectoralis musculature, likely traumatic in nature. Clinical correlation recommended. No drainable fluid collection identified at this time. No other acute intrathoracic pathology. Electronically Signed   By: Anner Crete M.D.   On: 12/17/2018 23:51   Ct Cervical Spine Wo Contrast  Result Date: 12/17/2018 CLINICAL DATA:  48 y/o M; fall with head and neck trauma. Recent neck surgery. EXAM: CT HEAD WITHOUT CONTRAST CT CERVICAL SPINE WITHOUT CONTRAST TECHNIQUE: Multidetector CT imaging of the head and cervical spine was performed following the standard protocol without intravenous contrast. Multiplanar CT image  reconstructions of the cervical spine were also generated. COMPARISON:  11/22/2018 CT neck. 08/04/2006 CT head and cervical spine. FINDINGS: CT HEAD FINDINGS Brain: No evidence of acute infarction, hemorrhage, hydrocephalus, extra-axial collection or mass lesion/mass effect. Vascular: No hyperdense vessel or unexpected calcification. Skull: Mild scalp thickening in the left parietal region, probably a small contusion. No calvarial fracture. Sinuses/Orbits: No acute finding. Other: None. CT CERVICAL SPINE FINDINGS Alignment: Mild reversal of cervical curvature with apex at C4-5. Skull base and vertebrae: No acute fracture. No primary bone lesion or focal pathologic process. Soft tissues and spinal canal: No prevertebral fluid or swelling. No visible canal hematoma. Disc levels: Mild-to-moderate discogenic degenerative changes at C5-C6 with loss of intervertebral disc space height and endplate marginal osteophytes. No high-grade bony foraminal or spinal canal stenosis. Upper chest: Negative. Other: Partially visualize is a soft tissue defect containing debris, possibly packing material, in the midline upper anterior chest wall, likely related to recent debridement. Suboptimal assessment of soft tissues in the absence of intravenous contrast. IMPRESSION: 1. Mild scalp thickening in the left parietal region, probably a small contusion. No calvarial fracture. 2. No acute intracranial abnormality. 3. No acute fracture or dislocation of the cervical spine. 4. Mild-to-moderate discogenic degenerative changes at C5-C6. 5. Partially visualize is a soft tissue defect containing debris, possibly packing material, in the midline upper anterior chest wall, likely related to recent debridement. Suboptimal assessment of soft tissues in the absence of intravenous contrast. Electronically Signed   By: Kristine Garbe M.D.   On: 12/17/2018 23:56   Dg Pelvis Portable  Result Date: 12/17/2018 CLINICAL DATA:  Unresponsive and  recent fall EXAM: PORTABLE PELVIS 1-2 VIEWS COMPARISON:  None. FINDINGS: Pelvic ring is intact. Degenerative changes of the hip joints are noted. No acute bony abnormality is seen. IMPRESSION: No acute abnormality noted. Electronically Signed   By: Inez Catalina M.D.   On: 12/17/2018 21:30   Dg Chest Port 1 View  Result Date: 12/17/2018 CLINICAL DATA:  Unresponsive EXAM: PORTABLE CHEST 1 VIEW COMPARISON:  11/22/2018 FINDINGS: Cardiac shadow is within normal limits. Lungs are well aerated bilaterally. Stable atelectatic changes are noted in the bases bilaterally. No focal infiltrate or effusion is seen. No bony abnormality is noted. IMPRESSION: Stable atelectatic  changes in the bases. Electronically Signed   By: Inez Catalina M.D.   On: 12/17/2018 21:28    Procedures Procedures (including critical care time)  CRITICAL CARE Performed by: Wandra Arthurs   Total critical care time: 30 minutes  Critical care time was exclusive of separately billable procedures and treating other patients.  Critical care was necessary to treat or prevent imminent or life-threatening deterioration.  Critical care was time spent personally by me on the following activities: development of treatment plan with patient and/or surrogate as well as nursing, discussions with consultants, evaluation of patient's response to treatment, examination of patient, obtaining history from patient or surrogate, ordering and performing treatments and interventions, ordering and review of laboratory studies, ordering and review of radiographic studies, pulse oximetry and re-evaluation of patient's condition.   Medications Ordered in ED Medications  potassium chloride SA (K-DUR) CR tablet 40 mEq (has no administration in time range)  sodium chloride (PF) 0.9 % injection (has no administration in time range)  metoprolol tartrate (LOPRESSOR) tablet 50 mg (has no administration in time range)  pantoprazole (PROTONIX) EC tablet 40 mg (has no  administration in time range)  cyclobenzaprine (FLEXERIL) tablet 10 mg (has no administration in time range)  sodium chloride flush (NS) 0.9 % injection 3 mL (has no administration in time range)  acetaminophen (TYLENOL) tablet 650 mg (has no administration in time range)    Or  acetaminophen (TYLENOL) suppository 650 mg (has no administration in time range)  HYDROcodone-acetaminophen (NORCO/VICODIN) 5-325 MG per tablet 1-2 tablet (has no administration in time range)  senna-docusate (Senokot-S) tablet 1 tablet (has no administration in time range)  bisacodyl (DULCOLAX) EC tablet 5 mg (has no administration in time range)  ondansetron (ZOFRAN) tablet 4 mg (has no administration in time range)    Or  ondansetron (ZOFRAN) injection 4 mg (has no administration in time range)  insulin aspart (novoLOG) injection 2-8 Units (has no administration in time range)  insulin aspart (novoLOG) injection 0-5 Units (0 Units Subcutaneous Not Given 12/18/18 0043)  dextrose 10 % infusion (has no administration in time range)  sodium chloride 0.9 % bolus 1,000 mL ( Intravenous Stopped 12/17/18 2314)  dextrose 5 % solution ( Intravenous Rate/Dose Verify 12/18/18 0020)  iohexol (OMNIPAQUE) 350 MG/ML injection 100 mL (100 mLs Intravenous Contrast Given 12/17/18 2318)     Initial Impression / Assessment and Plan / ED Course  I have reviewed the triage vital signs and the nursing notes.  Pertinent labs & imaging results that were available during my care of the patient were reviewed by me and considered in my medical decision making (see chart for details).       Joshia Kitchings is a 47 y.o. male here with altered mental status, hypoglycemia.  Patient had a potential fall as well and is tachycardic and hypothermia. I think likely he was hypoglycemic for awhile and that caused his hypothermia. Given potential head injury, will get CT head/neck. He had myositis of sternum that required I and D so will recheck CK level and CT  chest. Will monitor CBG closely.   11:30 pm CBG down to 76 now. Started on D5 drip. Patient was on the floor and CT head/neck and chest pending. Hospitalist to admit.   12:49 AM CT head showed scalp contusion but no fracture or bleeding. CT chest showed L pectoralis hematoma. He had myositis in the same area and had I and D by Dr. Redmond Baseman from ENT recently. His CK is  normal and this is likely expected course of the pectoralis myositis. Doesn't need emergent ENT or surgery eval. Can be admitted by hospitalist.   Final Clinical Impressions(s) / ED Diagnoses   Final diagnoses:  Hypoglycemia    ED Discharge Orders    None       Drenda Freeze, MD 12/18/18 [phone]

## 2018-12-18 ENCOUNTER — Other Ambulatory Visit: Payer: Self-pay

## 2018-12-18 DIAGNOSIS — T383X5A Adverse effect of insulin and oral hypoglycemic [antidiabetic] drugs, initial encounter: Secondary | ICD-10-CM | POA: Diagnosis present

## 2018-12-18 DIAGNOSIS — E160 Drug-induced hypoglycemia without coma: Secondary | ICD-10-CM | POA: Diagnosis present

## 2018-12-18 LAB — URINALYSIS, ROUTINE W REFLEX MICROSCOPIC
Bilirubin Urine: NEGATIVE
Glucose, UA: NEGATIVE mg/dL
Hgb urine dipstick: NEGATIVE
Ketones, ur: NEGATIVE mg/dL
Leukocytes,Ua: NEGATIVE
Nitrite: NEGATIVE
Protein, ur: NEGATIVE mg/dL
Specific Gravity, Urine: 1.027 (ref 1.005–1.030)
pH: 6 (ref 5.0–8.0)

## 2018-12-18 LAB — CBC WITH DIFFERENTIAL/PLATELET
Abs Immature Granulocytes: 0.01 10*3/uL (ref 0.00–0.07)
Basophils Absolute: 0 10*3/uL (ref 0.0–0.1)
Basophils Relative: 0 %
Eosinophils Absolute: 0 10*3/uL (ref 0.0–0.5)
Eosinophils Relative: 0 %
HCT: 29.6 % — ABNORMAL LOW (ref 39.0–52.0)
Hemoglobin: 9.3 g/dL — ABNORMAL LOW (ref 13.0–17.0)
Immature Granulocytes: 0 %
Lymphocytes Relative: 21 %
Lymphs Abs: 1.3 10*3/uL (ref 0.7–4.0)
MCH: 28.2 pg (ref 26.0–34.0)
MCHC: 31.4 g/dL (ref 30.0–36.0)
MCV: 89.7 fL (ref 80.0–100.0)
Monocytes Absolute: 0.8 10*3/uL (ref 0.1–1.0)
Monocytes Relative: 13 %
Neutro Abs: 4.1 10*3/uL (ref 1.7–7.7)
Neutrophils Relative %: 66 %
Platelets: 424 10*3/uL — ABNORMAL HIGH (ref 150–400)
RBC: 3.3 MIL/uL — ABNORMAL LOW (ref 4.22–5.81)
RDW: 15.3 % (ref 11.5–15.5)
WBC: 6.2 10*3/uL (ref 4.0–10.5)
nRBC: 0 % (ref 0.0–0.2)

## 2018-12-18 LAB — GLUCOSE, CAPILLARY
Glucose-Capillary: 147 mg/dL — ABNORMAL HIGH (ref 70–99)
Glucose-Capillary: 186 mg/dL — ABNORMAL HIGH (ref 70–99)
Glucose-Capillary: 198 mg/dL — ABNORMAL HIGH (ref 70–99)
Glucose-Capillary: 214 mg/dL — ABNORMAL HIGH (ref 70–99)

## 2018-12-18 LAB — BASIC METABOLIC PANEL
Anion gap: 12 (ref 5–15)
BUN: 14 mg/dL (ref 6–20)
CO2: 23 mmol/L (ref 22–32)
Calcium: 8.5 mg/dL — ABNORMAL LOW (ref 8.9–10.3)
Chloride: 100 mmol/L (ref 98–111)
Creatinine, Ser: 0.74 mg/dL (ref 0.61–1.24)
GFR calc Af Amer: >60 mL/min (ref >60)
GFR calc non Af Amer: >60 mL/min (ref >60)
Glucose, Bld: 204 mg/dL — ABNORMAL HIGH (ref 70–99)
Potassium: 4.4 mmol/L (ref 3.5–5.1)
Sodium: 135 mmol/L (ref 135–145)

## 2018-12-18 LAB — CBG MONITORING, ED: Glucose-Capillary: 99 mg/dL (ref 70–99)

## 2018-12-18 LAB — MAGNESIUM: Magnesium: 1.8 mg/dL (ref 1.7–2.4)

## 2018-12-18 LAB — SARS CORONAVIRUS 2 BY RT PCR (HOSPITAL ORDER, PERFORMED IN ~~LOC~~ HOSPITAL LAB): SARS Coronavirus 2: NEGATIVE

## 2018-12-18 MED ORDER — PANTOPRAZOLE SODIUM 40 MG PO TBEC
40.0000 mg | DELAYED_RELEASE_TABLET | Freq: Every day | ORAL | Status: DC
  Administered 2018-12-18: 40 mg via ORAL
  Filled 2018-12-18: qty 1

## 2018-12-18 MED ORDER — BISACODYL 5 MG PO TBEC: 5.0000 mg | DELAYED_RELEASE_TABLET | Freq: Every day | ORAL | Status: DC | PRN

## 2018-12-18 MED ORDER — ONDANSETRON HCL 4 MG/2ML IJ SOLN: 4.0000 mg | Freq: Four times a day (QID) | INTRAMUSCULAR | Status: DC | PRN

## 2018-12-18 MED ORDER — SENNOSIDES-DOCUSATE SODIUM 8.6-50 MG PO TABS: 1.0000 | ORAL_TABLET | Freq: Every evening | ORAL | Status: DC | PRN

## 2018-12-18 MED ORDER — ACETAMINOPHEN 325 MG PO TABS: 650.0000 mg | ORAL_TABLET | Freq: Four times a day (QID) | ORAL | Status: DC | PRN

## 2018-12-18 MED ORDER — SODIUM CHLORIDE 0.9% FLUSH
3.0000 mL | Freq: Two times a day (BID) | INTRAVENOUS | Status: DC
  Administered 2018-12-18: 3 mL via INTRAVENOUS

## 2018-12-18 MED ORDER — INSULIN ASPART 100 UNIT/ML ~~LOC~~ SOLN
2.0000 [IU] | Freq: Three times a day (TID) | SUBCUTANEOUS | Status: DC
  Administered 2018-12-18: 2 [IU] via SUBCUTANEOUS

## 2018-12-18 MED ORDER — DEXTROSE 10 % IV SOLN
INTRAVENOUS | Status: DC
  Administered 2018-12-18: 01:00:00 via INTRAVENOUS
  Filled 2018-12-18: qty 1000

## 2018-12-18 MED ORDER — CYCLOBENZAPRINE HCL 10 MG PO TABS: 10.0000 mg | ORAL_TABLET | Freq: Two times a day (BID) | ORAL | Status: DC | PRN

## 2018-12-18 MED ORDER — GLUCOSE BLOOD VI STRP: ORAL_STRIP | 2 refills | Status: DC

## 2018-12-18 MED ORDER — INSULIN ASPART 100 UNIT/ML ~~LOC~~ SOLN: 0.0000 [IU] | Freq: Every day | SUBCUTANEOUS | Status: DC

## 2018-12-18 MED ORDER — HYDROCODONE-ACETAMINOPHEN 5-325 MG PO TABS
1.0000 | ORAL_TABLET | ORAL | Status: DC | PRN
  Administered 2018-12-18: 1 via ORAL
  Administered 2018-12-18: 2 via ORAL
  Filled 2018-12-18: qty 1
  Filled 2018-12-18: qty 2

## 2018-12-18 MED ORDER — ACETAMINOPHEN 650 MG RE SUPP: 650.0000 mg | Freq: Four times a day (QID) | RECTAL | Status: DC | PRN

## 2018-12-18 MED ORDER — ONDANSETRON HCL 4 MG PO TABS: 4.0000 mg | ORAL_TABLET | Freq: Four times a day (QID) | ORAL | Status: DC | PRN

## 2018-12-18 MED ORDER — METOPROLOL TARTRATE 50 MG PO TABS
50.0000 mg | ORAL_TABLET | Freq: Two times a day (BID) | ORAL | Status: DC
  Administered 2018-12-18 (×2): 50 mg via ORAL
  Filled 2018-12-18: qty 1
  Filled 2018-12-18: qty 2

## 2018-12-18 NOTE — Progress Notes (Signed)
Pt has dressing over neck incision from an abscess in April. He does not wish to remove dressing until morning. He believes he will be able to go home in morning and he prefer to continue with his home wound care regime. Hoyle Barr, RN

## 2018-12-18 NOTE — Progress Notes (Signed)
Pt reports having a glucometer. He is currently being followed at [facility] and has appt on 01/17/2019. Jonnie Finner RN CCM Case Mgmt phone [phone]

## 2018-12-18 NOTE — Progress Notes (Signed)
Reviewed discharge information with patient . Answered all questions. Ptable to teach back medications, s/s of hypo and hyperglycemia, and reasons to contact MD/911. Patient verbalizes importance of PCP follow up appointment. Provided script for glucose strips. States he has a working meter, but is out of strips.   [name]. Brigitte Pulse, RN

## 2018-12-18 NOTE — H&P (Signed)
History and Physical    Taylor Hughes MMC:[id number] DOB: July 07, 1971 DOA: 12/17/2018  PCP: Taylor Panda, MD   Patient coming from: Home   Chief Complaint: Decreased LOC, hypoglycemia   HPI: Taylor Hughes is a 48 y.o. male with medical history significant for hypertension, insulin-dependent diabetes mellitus, and recent admission for deep space neck abscess with DKA, now presenting to the emergency department for evaluation of decreased level of consciousness and hypoglycemia.  The patient reports that he does not have a functioning glucometer at home right now, took his insulin as directed, but was beginning to feel as though he was hypoglycemic today.  His significant other found him to have decreased level of consciousness, suspected that this was due to hyperglycemia, and reportedly administered 7 units of Humalog.  His condition worsened and EMS found him to have CBG of 40.  He was administered glucagon and IV dextrose prior to arrival in the emergency department.  He is reported to have fallen shortly prior to arrival in the ED.  His condition improved in route to the hospital, he denies any fevers or chills, denies shortness of breath or cough, reports that he has had pain in the upper left chest since the recent surgery for his deep neck abscess, but that has continued to improve.  He denies any headache, change in vision or hearing, or focal numbness or weakness.  ED Course: Upon arrival to the ED, patient is found to be hypothermic to 94 F, saturating well on room air, tachypneic and tachycardic, and with stable blood pressure.  EKG features sinus tachycardia with rate 123.  Chemistry panel is notable for potassium of 3.2 and glucose of 87.  CBC features a mild normocytic anemia and thrombocytosis.  Troponin is undetectable and CK level is normal.  CTA chest is notable for intramuscular hematoma involving the left pectoralis muscle without drainable fluid collection.  CT head is negative for acute  intracranial abnormality and cervical spine CT is negative for acute fracture or subluxation.  Patient was started on dextrose infusion in the ED, given active external warming, and 40 mEq oral potassium.  Mental status has essentially returned to baseline, temperature is improving quickly, but glucose is dropping again despite the dextrose infusion and the patient will be observed for ongoing evaluation and management.  Review of Systems:  All other systems reviewed and apart from HPI, are negative.  Past Medical History:  Diagnosis Date   Diabetes mellitus without complication ([place])    Wears glasses    Wound, open, foot    left diabetic     Past Surgical History:  Procedure Laterality Date   AMPUTATION Left 08/20/2018   Procedure: LEFT FOOT IRRIGATON AND DEBRIDEMENT, 5TH RAY AMPUTATION;  Surgeon: Newt Minion, MD;  Location: [facility];  Service: Orthopedics;  Laterality: Left;   I&D EXTREMITY Left 07/30/2018   Procedure: IRRIGATION AND DEBRIDEMENT LEFT FOOT WITH POSSIBLE AMPUTATION OF FIFTH TOE;  Surgeon: Mcarthur Rossetti, MD;  Location: [facility];  Service: Orthopedics;  Laterality: Left;   MINOR IRRIGATION AND DEBRIDEMENT OF WOUND N/A 11/22/2018   Procedure: INCISION AND DRAINAGE OF NECK ABSCESS;  Surgeon: Taylor Quitter, MD;  Location: [facility];  Service: ENT;  Laterality: N/A;     reports that he has never smoked. He has never used smokeless tobacco. He reports that he does not drink alcohol or use drugs.  No Known Allergies  Family History  Problem Relation Age of Onset   Diabetes Mother  Prior to Admission medications   Medication Sig Start Date End Date Taking? Authorizing Provider  aspirin EC 81 MG tablet Take 1 tablet (81 mg total) by mouth daily. 11/29/18 11/29/19  [place] Eric J, DO  blood glucose meter kit and supplies KIT Dispense based on patient and insurance preference. Use up to four times daily as directed. (FOR ICD-9 250.00, 250.01). 11/29/18   [place]  Donnamarie Poag, DO  cyclobenzaprine (FLEXERIL) 10 MG tablet Take 1 tablet (10 mg total) by mouth 2 (two) times daily as needed for muscle spasms. 12/06/18   Charlott Rakes, MD  HYDROcodone-acetaminophen (NORCO) 7.5-325 MG tablet TK 1 T PO Q 6 H PRN FOR MODERATE PAIN 11/29/18   [provider]  insulin aspart (NOVOLOG FLEXPEN) 100 UNIT/ML FlexPen 0-12 units three times daily before meals according to sliding scale 12/06/18   Charlott Rakes, MD  insulin detemir (LEVEMIR) 100 UNIT/ML injection Inject 0.2 mLs (20 Units total) into the skin daily. 12/06/18   Charlott Rakes, MD  insulin lispro (HUMALOG KWIKPEN) 100 UNIT/ML KwikPen 0 - 12 units TID before meals according to sliding scale. 12/06/18   Charlott Rakes, MD  Insulin Pen Needle 32G X 5 MM MISC Use with levemir pen as prescribed 11/29/18   [place] Donnamarie Poag, DO  metoprolol tartrate (LOPRESSOR) 50 MG tablet Take 1 tablet (50 mg total) by mouth 2 (two) times daily for 30 days. 12/06/18 01/05/19  Charlott Rakes, MD  pantoprazole (PROTONIX) 40 MG tablet Take 1 tablet (40 mg total) by mouth daily. 11/30/18   [place] Eric J, DO    Physical Exam: Vitals:   12/17/18 2055 12/17/18 2100 12/17/18 2205 12/17/18 2345  BP:  (!) 143/90 122/85   Pulse:  (!) 122 (!) 115   Resp:  19 (!) 30   Temp: (!) 94 F (34.4 C)   (!) 97.4 F (36.3 C)  TempSrc: Rectal   Oral  SpO2:  97% 95%   Weight:      Height:        Constitutional: NAD, calm  Eyes: PERTLA, lids and conjunctivae normal ENMT: Mucous membranes are moist. Posterior pharynx clear of any exudate or lesions.   Neck: normal, supple, no masses, no thyromegaly Respiratory: clear to auscultation bilaterally, no wheezing, no crackles.  No accessory muscle use.  Cardiovascular: Rate ~110 and regular. Bilateral LE edema. Abdomen: No distension, no tenderness, soft. Bowel sounds normal.  Musculoskeletal: no clubbing / cyanosis. Mild swelling and tenderness involving upper left chest.  Skin: Bandages over  upper sternal surgical wound with scant serosanguinous drainage. Warm, dry, well-perfused. Neurologic: CN 2-12 grossly intact. Sensation intact. Moving all extremities.  Psychiatric: Alert and oriented x 3. Very pleasant and cooperative.    Labs on Admission: I have personally reviewed following labs and imaging studies  CBC: Recent Labs  Lab 12/17/18 2042 12/17/18 2150  WBC 9.1  --   NEUTROABS 7.5  --   HGB 10.3* 10.9*  HCT 32.4* 32.0*  MCV 88.8  --   PLT 443*  --    Basic Metabolic Panel: Recent Labs  Lab 12/17/18 2042 12/17/18 2149 12/17/18 2150  NA 136  --  138  K 3.2*  --  3.6  CL 102  --   --   CO2 24  --   --   GLUCOSE 87  --   --   BUN 17  --   --   CREATININE 0.78 0.70  --   CALCIUM 9.0  --   --  GFR: Estimated Creatinine Clearance: 155.2 mL/min (by C-G formula based on SCr of 0.7 mg/dL). Liver Function Tests: Recent Labs  Lab 12/17/18 2042  AST 16  ALT 10  ALKPHOS 60  BILITOT 0.3  PROT 8.3*  ALBUMIN 2.9*   No results for input(s): LIPASE, AMYLASE in the last 168 hours. No results for input(s): AMMONIA in the last 168 hours. Coagulation Profile: No results for input(s): INR, PROTIME in the last 168 hours. Cardiac Enzymes: Recent Labs  Lab 12/17/18 2042  CKTOTAL 130  TROPONINI <0.03   BNP (last 3 results) No results for input(s): PROBNP in the last 8760 hours. HbA1C: No results for input(s): HGBA1C in the last 72 hours. CBG: Recent Labs  Lab 12/17/18 2049 12/17/18 2204 12/17/18 2249  GLUCAP 105* 99 76   Lipid Profile: No results for input(s): CHOL, HDL, LDLCALC, TRIG, CHOLHDL, LDLDIRECT in the last 72 hours. Thyroid Function Tests: Recent Labs    12/17/18 2102  TSH 3.625   Anemia Panel: No results for input(s): VITAMINB12, FOLATE, FERRITIN, TIBC, IRON, RETICCTPCT in the last 72 hours. Urine analysis:    Component Value Date/Time   COLORURINE STRAW (A) 11/22/2018 1049   APPEARANCEUR CLEAR 11/22/2018 1049   LABSPEC 1.023  11/22/2018 1049   PHURINE 5.0 11/22/2018 1049   GLUCOSEU >=500 (A) 11/22/2018 1049   HGBUR MODERATE (A) 11/22/2018 1049   BILIRUBINUR NEGATIVE 11/22/2018 1049   KETONESUR 80 (A) 11/22/2018 1049   PROTEINUR NEGATIVE 11/22/2018 1049   NITRITE NEGATIVE 11/22/2018 1049   LEUKOCYTESUR NEGATIVE 11/22/2018 1049   Sepsis Labs: _0 (procalcitonin:4,lacticidven:4) )No results found for this or any previous visit (from the past 240 hour(s)).   Radiological Exams on Admission: Ct Head Wo Contrast  Result Date: 12/17/2018 CLINICAL DATA:  48 y/o M; fall with head and neck trauma. Recent neck surgery. EXAM: CT HEAD WITHOUT CONTRAST CT CERVICAL SPINE WITHOUT CONTRAST TECHNIQUE: Multidetector CT imaging of the head and cervical spine was performed following the standard protocol without intravenous contrast. Multiplanar CT image reconstructions of the cervical spine were also generated. COMPARISON:  11/22/2018 CT neck. 08/04/2006 CT head and cervical spine. FINDINGS: CT HEAD FINDINGS Brain: No evidence of acute infarction, hemorrhage, hydrocephalus, extra-axial collection or mass lesion/mass effect. Vascular: No hyperdense vessel or unexpected calcification. Skull: Mild scalp thickening in the left parietal region, probably a small contusion. No calvarial fracture. Sinuses/Orbits: No acute finding. Other: None. CT CERVICAL SPINE FINDINGS Alignment: Mild reversal of cervical curvature with apex at C4-5. Skull base and vertebrae: No acute fracture. No primary bone lesion or focal pathologic process. Soft tissues and spinal canal: No prevertebral fluid or swelling. No visible canal hematoma. Disc levels: Mild-to-moderate discogenic degenerative changes at C5-C6 with loss of intervertebral disc space height and endplate marginal osteophytes. No high-grade bony foraminal or spinal canal stenosis. Upper chest: Negative. Other: Partially visualize is a soft tissue defect containing debris, possibly packing material,  in the midline upper anterior chest wall, likely related to recent debridement. Suboptimal assessment of soft tissues in the absence of intravenous contrast. IMPRESSION: 1. Mild scalp thickening in the left parietal region, probably a small contusion. No calvarial fracture. 2. No acute intracranial abnormality. 3. No acute fracture or dislocation of the cervical spine. 4. Mild-to-moderate discogenic degenerative changes at C5-C6. 5. Partially visualize is a soft tissue defect containing debris, possibly packing material, in the midline upper anterior chest wall, likely related to recent debridement. Suboptimal assessment of soft tissues in the absence of intravenous contrast. Electronically Signed  By: Kristine Garbe M.D.   On: 12/17/2018 23:56   Ct Angio Chest Pe W And/or Wo Contrast  Result Date: 12/17/2018 CLINICAL DATA:  48 year old male with fall. Recent neck surgery with drainage. EXAM: CT ANGIOGRAPHY CHEST WITH CONTRAST TECHNIQUE: Multidetector CT imaging of the chest was performed using the standard protocol during bolus administration of intravenous contrast. Multiplanar CT image reconstructions and MIPs were obtained to evaluate the vascular anatomy. CONTRAST:  181m OMNIPAQUE IOHEXOL 350 MG/ML SOLN COMPARISON:  Chest radiograph dated 12/17/2018 FINDINGS: Evaluation of this exam is limited due to respiratory motion artifact. Cardiovascular: Top-normal cardiac size. No pericardial effusion. Coronary vascular calcifications involving the LAD and RCA. The thoracic aorta is unremarkable. The central pulmonary arteries are grossly unremarkable. Mediastinum/Nodes: No hilar or mediastinal adenopathy. The esophagus and the thyroid gland are grossly unremarkable. Lungs/Pleura: There are bibasilar linear atelectasis/scarring. Minimal left lung base subpleural atelectasis. No consolidative changes. There is no pleural effusion or pneumothorax. Central airways are patent. Upper Abdomen: No acute  abnormality. Musculoskeletal: Old healed right posterior rib fractures. No acute osseous pathology. There is enlargement of the left pectoralis musculature with low attenuating content most consistent with intramuscular hematoma. There is extension of the hematoma in the superior portion of the anterior mediastinum. There is diffuse subcutaneous edema of the anterior chest wall. Review of the MIP images confirms the above findings. IMPRESSION: Findings most consistent with intramuscular hematoma involving the left pectoralis musculature, likely traumatic in nature. Clinical correlation recommended. No drainable fluid collection identified at this time. No other acute intrathoracic pathology. Electronically Signed   By: AAnner CreteM.D.   On: 12/17/2018 23:51   Ct Cervical Spine Wo Contrast  Result Date: 12/17/2018 CLINICAL DATA:  48y/o M; fall with head and neck trauma. Recent neck surgery. EXAM: CT HEAD WITHOUT CONTRAST CT CERVICAL SPINE WITHOUT CONTRAST TECHNIQUE: Multidetector CT imaging of the head and cervical spine was performed following the standard protocol without intravenous contrast. Multiplanar CT image reconstructions of the cervical spine were also generated. COMPARISON:  11/22/2018 CT neck. 08/04/2006 CT head and cervical spine. FINDINGS: CT HEAD FINDINGS Brain: No evidence of acute infarction, hemorrhage, hydrocephalus, extra-axial collection or mass lesion/mass effect. Vascular: No hyperdense vessel or unexpected calcification. Skull: Mild scalp thickening in the left parietal region, probably a small contusion. No calvarial fracture. Sinuses/Orbits: No acute finding. Other: None. CT CERVICAL SPINE FINDINGS Alignment: Mild reversal of cervical curvature with apex at C4-5. Skull base and vertebrae: No acute fracture. No primary bone lesion or focal pathologic process. Soft tissues and spinal canal: No prevertebral fluid or swelling. No visible canal hematoma. Disc levels: Mild-to-moderate  discogenic degenerative changes at C5-C6 with loss of intervertebral disc space height and endplate marginal osteophytes. No high-grade bony foraminal or spinal canal stenosis. Upper chest: Negative. Other: Partially visualize is a soft tissue defect containing debris, possibly packing material, in the midline upper anterior chest wall, likely related to recent debridement. Suboptimal assessment of soft tissues in the absence of intravenous contrast. IMPRESSION: 1. Mild scalp thickening in the left parietal region, probably a small contusion. No calvarial fracture. 2. No acute intracranial abnormality. 3. No acute fracture or dislocation of the cervical spine. 4. Mild-to-moderate discogenic degenerative changes at C5-C6. 5. Partially visualize is a soft tissue defect containing debris, possibly packing material, in the midline upper anterior chest wall, likely related to recent debridement. Suboptimal assessment of soft tissues in the absence of intravenous contrast. Electronically Signed   By: LEdgardo RoysD.  On: 12/17/2018 23:56   Dg Pelvis Portable  Result Date: 12/17/2018 CLINICAL DATA:  Unresponsive and recent fall EXAM: PORTABLE PELVIS 1-2 VIEWS COMPARISON:  None. FINDINGS: Pelvic ring is intact. Degenerative changes of the hip joints are noted. No acute bony abnormality is seen. IMPRESSION: No acute abnormality noted. Electronically Signed   By: Inez Catalina M.D.   On: 12/17/2018 21:30   Dg Chest Port 1 View  Result Date: 12/17/2018 CLINICAL DATA:  Unresponsive EXAM: PORTABLE CHEST 1 VIEW COMPARISON:  11/22/2018 FINDINGS: Cardiac shadow is within normal limits. Lungs are well aerated bilaterally. Stable atelectatic changes are noted in the bases bilaterally. No focal infiltrate or effusion is seen. No bony abnormality is noted. IMPRESSION: Stable atelectatic changes in the bases. Electronically Signed   By: Inez Catalina M.D.   On: 12/17/2018 21:28    EKG: Independently reviewed. Sinus  tachycardia (rate 123).   Assessment/Plan  1. Hypoglycemia due to insulin; IDDM  - Patient reports taking his insulin today but did not eat much, felt as though he was becoming hypoglycemic but does not have a working glucometer, developed AMS, and was then given some short-acting insulin by his significant other who though that his condition was d/t hyperglycemia  - CBG was 40 on EMS arrival and was treated with glucagon and IV dextrose  - He was hypothermic on arrival to ED, but this improved quickly with correction of his blood sugar  - A1c was 11.4% in April and patient is treated with Levemir and Humalog at home  - Continue dextrose infusion for a couple more hours, continue frequent CBG's, and resume insulin with a low-intensity sliding-scale once he is remaining stable off of the IV dextrose   2. Neck abscess; intramuscular hematoma  - Patient underwent I&D of deep space neck abscess by ENT on 11/22/18, reports that he continues to have some pain and swelling at the upper left chest but that it continues to improve  - There is a suspected intramuscular hematoma involving left pectoris muscle on CTA in ED with no drainable fluid collection  - Aspirin held on admission and patient will continue ENT follow-up    3. Hypokalemia  - Serum potassium is 3.2 on admission and replaced in ED  - Repeat chem panel in am   4. Hypertension  - BP at goal, continue Lopressor     PPE: Mask, face shield. Patient wearing mask.   DVT prophylaxis: SCD's  Code Status: Full  Family Communication: Discussed with patient  Consults called: none Admission status: Observation     Vianne Bulls, MD [facility] Pager [phone]  If 7PM-7AM, please contact night-coverage [email or web address] Password [email or web address]  12/18/2018, 12:20 AM

## 2018-12-18 NOTE — Progress Notes (Signed)
Pt has threatened to leave for the past hour. I have asked him to remain in room and MD rounds will begin soon. Pt adamant about leaving by 0800. First he said 0900 and now has changed to 0800. He will not give reason why he must leave immediately.Explained the CM would help him get strips for his glucometer. He states he has money and can get strips anytime and he states he has just not gone to get them lately. I explained the dangers of leaving AMA. Pt in room dressed. Will report to oncoming RN. Hoyle Barr, RN

## 2018-12-18 NOTE — ED Notes (Signed)
Phone report given to [place], [place]

## 2018-12-20 MED FILL — SULFAMETHOXAZOLE-TMP DS TAB: 800-160 | 10 days supply | Qty: 20 | Fill #0

## 2018-12-21 NOTE — Discharge Summary (Signed)
Physician Discharge Summary  Taylor Hughes HQP:[id number] DOB: [birth year] DOA: 12/17/2018  PCP: Charlott Rakes, MD  Admit date: 12/17/2018 Discharge date: 12/21/2018  Time spent: 15 minutes  Recommendations for Outpatient Follow-up:  1. Patient was given refills on medications and strips for his glucometer on discharge  Discharge Diagnoses:  Principal Problem:   Acute encephalopathy Active Problems:   Neck abscess   Essential hypertension   Hypoglycemia due to insulin   Discharge Condition: Improved  Diet recommendation: Heart healthy diabetic  Filed Weights   12/17/18 2052 12/18/18 0223  Weight: 124.7 kg 120.8 kg    History of present illness:  48 year old male HTN insulin-dependent DM prior deep space neck abscess follows up with Dr. Redmond Baseman Accidentally was given 7 units of insulin over and above his normal insulin as he was not feeling well-his wife did not check his blood sugar presumed that he was hyperglycemic-he dropped his blood sugars and EMS found him to have a CBG of 40 and he had a fall  He was admitted to the hospital placed on dextrose infusion given external warming because he was hypothermic he did not have any septic parameters and was observed overnight in the hospital  It was noted that he did not have enough testing strips and I reordered the same on discharge he felt comfortable and had met maximal benefit from hospital stay overnight  Discharge Exam: Vitals:   12/18/18 0749 12/18/18 0833  BP: 109/78   Pulse: (!) 137   Resp: 19   Temp: 99.2 F (37.3 C)   SpO2: 100% 100%    General: Awake alert pleasant no distress, thick neck Mallampati 4, no JVD no bruit Cardiovascular: S1-S2 no murmur Respiratory: Clinically clear no added sound Abdomen soft nontender nondistended no rebound no guarding neurologically intact Discharge Instructions   Discharge Instructions    Diet - low sodium heart healthy   Complete by:  As directed    Discharge instructions    Complete by:  As directed    Please follow-up with your primary physician and be careful taking your insulin I would recommend that you get the refills on the strips for your insulin meter Please get lab work in about 1 week Please follow-up with Dr. Redmond Baseman for your neck wound which I do not think is infected but needs close attention in the outpatient setting Best of luck   Increase activity slowly   Complete by:  As directed      Allergies as of 12/18/2018   No Known Allergies     Medication List    STOP taking these medications   insulin lispro 100 UNIT/ML KwikPen Commonly known as:  HumaLOG KwikPen     TAKE these medications   aspirin EC 81 MG tablet Take 1 tablet (81 mg total) by mouth daily.   blood glucose meter kit and supplies Kit Dispense based on patient and insurance preference. Use up to four times daily as directed. (FOR ICD-9 250.00, 250.01).   cyclobenzaprine 10 MG tablet Commonly known as:  FLEXERIL Take 1 tablet (10 mg total) by mouth 2 (two) times daily as needed for muscle spasms.   glucose blood test strip Needs supply of TRUEMETRIC STRIPS Use as instructed   ibuprofen 200 MG tablet Commonly known as:  ADVIL Take 400 mg by mouth every 6 (six) hours as needed for moderate pain.   insulin aspart 100 UNIT/ML FlexPen Commonly known as:  NovoLOG FlexPen 0-12 units three times daily before meals according to sliding  scale What changed:    how much to take  how to take this  when to take this  additional instructions   insulin detemir 100 UNIT/ML injection Commonly known as:  Levemir Inject 0.2 mLs (20 Units total) into the skin daily.   Insulin Pen Needle 32G X 5 MM Misc Use with levemir pen as prescribed   metoprolol tartrate 50 MG tablet Commonly known as:  LOPRESSOR Take 1 tablet (50 mg total) by mouth 2 (two) times daily for 30 days.   pantoprazole 40 MG tablet Commonly known as:  PROTONIX Take 1 tablet (40 mg total) by mouth daily.       No Known Allergies    The results of significant diagnostics from this hospitalization (including imaging, microbiology, ancillary and laboratory) are listed below for reference.    Significant Diagnostic Studies: Ct Head Wo Contrast  Result Date: 12/17/2018 CLINICAL DATA:  48 y/o M; fall with head and neck trauma. Recent neck surgery. EXAM: CT HEAD WITHOUT CONTRAST CT CERVICAL SPINE WITHOUT CONTRAST TECHNIQUE: Multidetector CT imaging of the head and cervical spine was performed following the standard protocol without intravenous contrast. Multiplanar CT image reconstructions of the cervical spine were also generated. COMPARISON:  11/22/2018 CT neck. 08/04/2006 CT head and cervical spine. FINDINGS: CT HEAD FINDINGS Brain: No evidence of acute infarction, hemorrhage, hydrocephalus, extra-axial collection or mass lesion/mass effect. Vascular: No hyperdense vessel or unexpected calcification. Skull: Mild scalp thickening in the left parietal region, probably a small contusion. No calvarial fracture. Sinuses/Orbits: No acute finding. Other: None. CT CERVICAL SPINE FINDINGS Alignment: Mild reversal of cervical curvature with apex at C4-5. Skull base and vertebrae: No acute fracture. No primary bone lesion or focal pathologic process. Soft tissues and spinal canal: No prevertebral fluid or swelling. No visible canal hematoma. Disc levels: Mild-to-moderate discogenic degenerative changes at C5-C6 with loss of intervertebral disc space height and endplate marginal osteophytes. No high-grade bony foraminal or spinal canal stenosis. Upper chest: Negative. Other: Partially visualize is a soft tissue defect containing debris, possibly packing material, in the midline upper anterior chest wall, likely related to recent debridement. Suboptimal assessment of soft tissues in the absence of intravenous contrast. IMPRESSION: 1. Mild scalp thickening in the left parietal region, probably a small contusion. No calvarial  fracture. 2. No acute intracranial abnormality. 3. No acute fracture or dislocation of the cervical spine. 4. Mild-to-moderate discogenic degenerative changes at C5-C6. 5. Partially visualize is a soft tissue defect containing debris, possibly packing material, in the midline upper anterior chest wall, likely related to recent debridement. Suboptimal assessment of soft tissues in the absence of intravenous contrast. Electronically Signed   By: Kristine Garbe M.D.   On: 12/17/2018 23:56   Ct Soft Tissue Neck W Contrast  Result Date: 11/22/2018 CLINICAL DATA:  Sudden onset of swelling in the suprasternal region EXAM: CT NECK WITH CONTRAST TECHNIQUE: Multidetector CT imaging of the neck was performed using the standard protocol following the bolus administration of intravenous contrast. CONTRAST:  153m OMNIPAQUE IOHEXOL 350 MG/ML SOLN COMPARISON:  None. FINDINGS: Pharynx and larynx: No mucosal or submucosal lesion is seen. Salivary glands: Parotid and submandibular glands are normal. Thyroid: Normal Lymph nodes: No enlarged or low-density nodes seen in the neck itself. Normal bilateral nodes. Vascular: Normal Limited intracranial: Normal Visualized orbits: Normal Mastoids and visualized paranasal sinuses: Clear Skeleton: No acute skeletal abnormality is seen. Upper chest: There appears to be asymmetric swelling of the left pectoralis musculature. There is extensive soft tissue abnormality  at the thoracic inlet anteriorly extending more towards the left. The area of concern measures about 6 cm in diameter and contains a few tiny air bubbles. This is most consistent with phlegmonous inflammatory change. Most commonly, this might emanate from a septic sternoclavicular joint, but there is no distinct evidence of that on the CT. Other: None IMPRESSION: Phlegmonous inflammatory change at the thoracic inlet anteriorly, more to the left, measuring about 6 cm in diameter with a few tiny air bubbles. Asymmetric  enlargement of the left pectoralis muscle suggesting myositis. Often, infection in this region will arise from the sternoclavicular joint, but I cannot establish that in this case. Electronically Signed   By: Nelson Chimes M.D.   On: 11/22/2018 11:00   Ct Angio Chest Pe W And/or Wo Contrast  Result Date: 12/17/2018 CLINICAL DATA:  48 year old male with fall. Recent neck surgery with drainage. EXAM: CT ANGIOGRAPHY CHEST WITH CONTRAST TECHNIQUE: Multidetector CT imaging of the chest was performed using the standard protocol during bolus administration of intravenous contrast. Multiplanar CT image reconstructions and MIPs were obtained to evaluate the vascular anatomy. CONTRAST:  111m OMNIPAQUE IOHEXOL 350 MG/ML SOLN COMPARISON:  Chest radiograph dated 12/17/2018 FINDINGS: Evaluation of this exam is limited due to respiratory motion artifact. Cardiovascular: Top-normal cardiac size. No pericardial effusion. Coronary vascular calcifications involving the LAD and RCA. The thoracic aorta is unremarkable. The central pulmonary arteries are grossly unremarkable. Mediastinum/Nodes: No hilar or mediastinal adenopathy. The esophagus and the thyroid gland are grossly unremarkable. Lungs/Pleura: There are bibasilar linear atelectasis/scarring. Minimal left lung base subpleural atelectasis. No consolidative changes. There is no pleural effusion or pneumothorax. Central airways are patent. Upper Abdomen: No acute abnormality. Musculoskeletal: Old healed right posterior rib fractures. No acute osseous pathology. There is enlargement of the left pectoralis musculature with low attenuating content most consistent with intramuscular hematoma. There is extension of the hematoma in the superior portion of the anterior mediastinum. There is diffuse subcutaneous edema of the anterior chest wall. Review of the MIP images confirms the above findings. IMPRESSION: Findings most consistent with intramuscular hematoma involving the left  pectoralis musculature, likely traumatic in nature. Clinical correlation recommended. No drainable fluid collection identified at this time. No other acute intrathoracic pathology. Electronically Signed   By: AAnner CreteM.D.   On: 12/17/2018 23:51   Ct Angio Chest Pe W And/or Wo Contrast  Result Date: 11/22/2018 CLINICAL DATA:  Acute onset chest pain 2 days ago after trying to start lawn mower. Chest pain, shortness of breath, and tachycardia. EXAM: CT ANGIOGRAPHY CHEST WITH CONTRAST TECHNIQUE: Multidetector CT imaging of the chest was performed using the standard protocol during bolus administration of intravenous contrast. Multiplanar CT image reconstructions and MIPs were obtained to evaluate the vascular anatomy. CONTRAST:  1074mOMNIPAQUE IOHEXOL 350 MG/ML SOLN COMPARISON:  None. FINDINGS: Cardiovascular: Satisfactory opacification of pulmonary arteries noted, however evaluation of peripheral pulmonary arteries is limited by significant respiratory motion artifact. No central pulmonary emboli identified. No evidence of thoracic aortic dissection or aneurysm. Mediastinum/Nodes: No masses or pathologically enlarged lymph nodes identified within the mediastinum. Abnormal soft tissue density is seen in the anterior lower neck just above the sternal notch. Lungs/Pleura: No pulmonary mass, infiltrate, or effusion. Mild right lower lobe scarring noted. Upper abdomen: No acute findings. Musculoskeletal: No suspicious bone lesions identified. Multiple old right rib fracture deformities noted. Review of the MIP images confirms the above findings. IMPRESSION: 1. Suboptimal evaluation of peripheral pulmonary arteries due to significant respiratory motion artifact. No  central pulmonary emboli identified. 2. No evidence of thoracic aortic dissection or aneurysm. 3. Abnormal soft tissue density in anterior lower neck above the sternal notch. See separate report for neck CT also performed today. Electronically Signed    By: Earle Gell M.D.   On: 11/22/2018 11:29   Ct Cervical Spine Wo Contrast  Result Date: 12/17/2018 CLINICAL DATA:  48 y/o M; fall with head and neck trauma. Recent neck surgery. EXAM: CT HEAD WITHOUT CONTRAST CT CERVICAL SPINE WITHOUT CONTRAST TECHNIQUE: Multidetector CT imaging of the head and cervical spine was performed following the standard protocol without intravenous contrast. Multiplanar CT image reconstructions of the cervical spine were also generated. COMPARISON:  11/22/2018 CT neck. 08/04/2006 CT head and cervical spine. FINDINGS: CT HEAD FINDINGS Brain: No evidence of acute infarction, hemorrhage, hydrocephalus, extra-axial collection or mass lesion/mass effect. Vascular: No hyperdense vessel or unexpected calcification. Skull: Mild scalp thickening in the left parietal region, probably a small contusion. No calvarial fracture. Sinuses/Orbits: No acute finding. Other: None. CT CERVICAL SPINE FINDINGS Alignment: Mild reversal of cervical curvature with apex at C4-5. Skull base and vertebrae: No acute fracture. No primary bone lesion or focal pathologic process. Soft tissues and spinal canal: No prevertebral fluid or swelling. No visible canal hematoma. Disc levels: Mild-to-moderate discogenic degenerative changes at C5-C6 with loss of intervertebral disc space height and endplate marginal osteophytes. No high-grade bony foraminal or spinal canal stenosis. Upper chest: Negative. Other: Partially visualize is a soft tissue defect containing debris, possibly packing material, in the midline upper anterior chest wall, likely related to recent debridement. Suboptimal assessment of soft tissues in the absence of intravenous contrast. IMPRESSION: 1. Mild scalp thickening in the left parietal region, probably a small contusion. No calvarial fracture. 2. No acute intracranial abnormality. 3. No acute fracture or dislocation of the cervical spine. 4. Mild-to-moderate discogenic degenerative changes at  C5-C6. 5. Partially visualize is a soft tissue defect containing debris, possibly packing material, in the midline upper anterior chest wall, likely related to recent debridement. Suboptimal assessment of soft tissues in the absence of intravenous contrast. Electronically Signed   By: Kristine Garbe M.D.   On: 12/17/2018 23:56   Dg Pelvis Portable  Result Date: 12/17/2018 CLINICAL DATA:  Unresponsive and recent fall EXAM: PORTABLE PELVIS 1-2 VIEWS COMPARISON:  None. FINDINGS: Pelvic ring is intact. Degenerative changes of the hip joints are noted. No acute bony abnormality is seen. IMPRESSION: No acute abnormality noted. Electronically Signed   By: Inez Catalina M.D.   On: 12/17/2018 21:30   Dg Chest Port 1 View  Result Date: 12/17/2018 CLINICAL DATA:  Unresponsive EXAM: PORTABLE CHEST 1 VIEW COMPARISON:  11/22/2018 FINDINGS: Cardiac shadow is within normal limits. Lungs are well aerated bilaterally. Stable atelectatic changes are noted in the bases bilaterally. No focal infiltrate or effusion is seen. No bony abnormality is noted. IMPRESSION: Stable atelectatic changes in the bases. Electronically Signed   By: Inez Catalina M.D.   On: 12/17/2018 21:28   Vas [place] Lower Extremity Venous (dvt) (only [facility])  Result Date: 11/22/2018  Lower Venous Study Indications: Edema, and SOB.  Limitations: Body habitus and interstitial edema. Comparison Study: Prior negative study for comparison from 07/29/18 Performing Technologist: Sharion Dove RVS  Examination Guidelines: A complete evaluation includes B-mode imaging, spectral Doppler, color Doppler, and power Doppler as needed of all accessible portions of each vessel. Bilateral testing is considered an integral part of a complete examination. Limited examinations for reoccurring indications may be  performed as noted.  Right Venous Findings: +---+---------------+---------+-----------+----------+-------+     CompressibilityPhasicitySpontaneityPropertiesSummary +---+---------------+---------+-----------+----------+-------+ CFVFull           Yes      Yes                          +---+---------------+---------+-----------+----------+-------+  Left Venous Findings: +---------+---------------+---------+-----------+----------+--------------+          CompressibilityPhasicitySpontaneityPropertiesSummary        +---------+---------------+---------+-----------+----------+--------------+ CFV      Full           Yes      Yes                                 +---------+---------------+---------+-----------+----------+--------------+ SFJ      Full                                                        +---------+---------------+---------+-----------+----------+--------------+ FV Prox  Full                                                        +---------+---------------+---------+-----------+----------+--------------+ FV Mid   Full                                                        +---------+---------------+---------+-----------+----------+--------------+ FV DistalFull                                                        +---------+---------------+---------+-----------+----------+--------------+ PFV      Full                                                        +---------+---------------+---------+-----------+----------+--------------+ POP      Full           Yes      Yes                                 +---------+---------------+---------+-----------+----------+--------------+ PTV      Full                                                        +---------+---------------+---------+-----------+----------+--------------+ PERO  Not visualized +---------+---------------+---------+-----------+----------+--------------+    Summary: Right: No evidence of common femoral vein obstruction. Left: There is  no evidence of deep vein thrombosis in the lower extremity. However, portions of this examination were limited- see technologist comments above.  *See table(s) above for measurements and observations. Electronically signed by Monica Martinez MD on 11/22/2018 at 5:15:48 PM.    Final     Microbiology: Recent Results (from the past 240 hour(s))  SARS Coronavirus 2 (CEPHEID - Performed in [place] hospital lab), Hosp Order     Status: None   Collection Time: 12/18/18 12:47 AM  Result Value Ref Range Status   SARS Coronavirus 2 NEGATIVE NEGATIVE Final    Comment: (NOTE) If result is NEGATIVE SARS-CoV-2 target nucleic acids are NOT DETECTED. The SARS-CoV-2 RNA is generally detectable in upper and lower  respiratory specimens during the acute phase of infection. The lowest  concentration of SARS-CoV-2 viral copies this assay can detect is 250  copies / mL. A negative result does not preclude SARS-CoV-2 infection  and should not be used as the sole basis for treatment or other  patient management decisions.  A negative result may occur with  improper specimen collection / handling, submission of specimen other  than nasopharyngeal swab, presence of viral mutation(s) within the  areas targeted by this assay, and inadequate number of viral copies  (<250 copies / mL). A negative result must be combined with clinical  observations, patient history, and epidemiological information. If result is POSITIVE SARS-CoV-2 target nucleic acids are DETECTED. The SARS-CoV-2 RNA is generally detectable in upper and lower  respiratory specimens dur ing the acute phase of infection.  Positive  results are indicative of active infection with SARS-CoV-2.  Clinical  correlation with patient history and other diagnostic information is  necessary to determine patient infection status.  Positive results do  not rule out bacterial infection or co-infection with other viruses. If result is PRESUMPTIVE  POSTIVE SARS-CoV-2 nucleic acids MAY BE PRESENT.   A presumptive positive result was obtained on the submitted specimen  and confirmed on repeat testing.  While 2019 novel coronavirus  (SARS-CoV-2) nucleic acids may be present in the submitted sample  additional confirmatory testing may be necessary for epidemiological  and / or clinical management purposes  to differentiate between  SARS-CoV-2 and other Sarbecovirus currently known to infect humans.  If clinically indicated additional testing with an alternate test  methodology [id number]) is advised. The SARS-CoV-2 RNA is generally  detectable in upper and lower respiratory sp ecimens during the acute  phase of infection. The expected result is Negative. Fact Sheet for Patients:  StrictlyIdeas.no Fact Sheet for Healthcare Providers: BankingDealers.co.za This test is not yet approved or cleared by the [place] FDA and has been authorized for detection and/or diagnosis of SARS-CoV-2 by FDA under an Emergency Use Authorization (EUA).  This EUA will remain in effect (meaning this test can be used) for the duration of the COVID-19 declaration under Section 564(b)(1) of the Act, 21 U.S.C. section 360bbb-3(b)(1), unless the authorization is terminated or revoked sooner. Performed at [facility], [place] [address], [place], [place] [address]      Labs: Basic Metabolic Panel: Recent Labs  Lab 12/17/18 2042 12/17/18 2149 12/17/18 2150 12/18/18 0524  NA 136  --  138 135  K 3.2*  --  3.6 4.4  CL 102  --   --  100  CO2 24  --   --  23  GLUCOSE 87  --   --  204*  BUN 17  --   --  14  CREATININE 0.78 0.70  --  0.74  CALCIUM 9.0  --   --  8.5*  MG  --   --   --  1.8   Liver Function Tests: Recent Labs  Lab 12/17/18 2042  AST 16  ALT 10  ALKPHOS 60  BILITOT 0.3  PROT 8.3*  ALBUMIN 2.9*   No results for input(s): LIPASE, AMYLASE in the last 168 hours. No  results for input(s): AMMONIA in the last 168 hours. CBC: Recent Labs  Lab 12/17/18 2042 12/17/18 2150 12/18/18 0524  WBC 9.1  --  6.2  NEUTROABS 7.5  --  4.1  HGB 10.3* 10.9* 9.3*  HCT 32.4* 32.0* 29.6*  MCV 88.8  --  89.7  PLT 443*  --  424*   Cardiac Enzymes: Recent Labs  Lab 12/17/18 2042  CKTOTAL 130  TROPONINI <0.03   BNP: BNP (last 3 results) No results for input(s): BNP in the last 8760 hours.  ProBNP (last 3 results) No results for input(s): PROBNP in the last 8760 hours.  CBG: Recent Labs  Lab 12/18/18 0042 12/18/18 0206 12/18/18 0357 12/18/18 0558 12/18/18 0725  GLUCAP 99 147* 186* 198* 214*       Signed:  Nita Sells MD   [facility] 12/21/2018, 5:21 PM

## 2018-12-22 ENCOUNTER — Other Ambulatory Visit: Payer: Self-pay

## 2018-12-22 MED ORDER — GLUCOSE BLOOD VI STRP: ORAL_STRIP | 2 refills | Status: DC

## 2018-12-30 MED FILL — !LEVEMIR 100 UNITS/ML VIAL: 100/ML | 42 days supply | Qty: 10 | Fill #0

## 2018-12-30 MED FILL — TRUE METRIX TEST STRIP: 25 days supply | Qty: 100 | Fill #0

## 2018-12-30 MED FILL — !HUMALOG 100 UNITS/ML KWIKP: 100 | 25 days supply | Qty: 9 | Fill #0

## 2019-01-07 MED FILL — CYCLOBENZAPRINE 10 MG TAB: 10 | 30 days supply | Qty: 60 | Fill #1

## 2019-01-12 ENCOUNTER — Other Ambulatory Visit: Payer: Self-pay | Admitting: Otolaryngology

## 2019-01-17 ENCOUNTER — Other Ambulatory Visit (HOSPITAL_COMMUNITY)
Admission: RE | Admit: 2019-01-17 | Discharge: 2019-01-17 | Disposition: A | Discharge status: Home / Self Care | Payer: HRSA Program | Source: Ambulatory Visit | Attending: Otolaryngology | Admitting: Otolaryngology

## 2019-01-17 ENCOUNTER — Other Ambulatory Visit: Payer: Self-pay

## 2019-01-17 ENCOUNTER — Ambulatory Visit: Payer: Self-pay | Attending: Family Medicine | Admitting: Family Medicine

## 2019-01-17 ENCOUNTER — Encounter: Payer: Self-pay | Admitting: Family Medicine

## 2019-01-17 DIAGNOSIS — Z1159 Encounter for screening for other viral diseases: Secondary | ICD-10-CM | POA: Diagnosis present

## 2019-01-17 DIAGNOSIS — L0211 Cutaneous abscess of neck: Secondary | ICD-10-CM

## 2019-01-17 DIAGNOSIS — Z794 Long term (current) use of insulin: Secondary | ICD-10-CM

## 2019-01-17 DIAGNOSIS — E1169 Type 2 diabetes mellitus with other specified complication: Secondary | ICD-10-CM

## 2019-01-17 LAB — SARS CORONAVIRUS 2 BY RT PCR (HOSPITAL ORDER, PERFORMED IN ~~LOC~~ HOSPITAL LAB): SARS Coronavirus 2: NEGATIVE

## 2019-01-17 NOTE — Progress Notes (Signed)
Patient has been called and DOB has been verified. Patient has been screened and transferred to PCP to start phone visit.     

## 2019-01-17 NOTE — Patient Instructions (Signed)
Diabetes Mellitus and Nutrition, Adult  When you have diabetes (diabetes mellitus), it is very important to have healthy eating habits because your blood sugar (glucose) levels are greatly affected by what you eat and drink. Eating healthy foods in the appropriate amounts, at about the same times every day, can help you:  · Control your blood glucose.  · Lower your risk of heart disease.  · Improve your blood pressure.  · Reach or maintain a healthy weight.  Every person with diabetes is different, and each person has different needs for a meal plan. Your health care provider may recommend that you work with a diet and nutrition specialist (dietitian) to make a meal plan that is best for you. Your meal plan may vary depending on factors such as:  · The calories you need.  · The medicines you take.  · Your weight.  · Your blood glucose, blood pressure, and cholesterol levels.  · Your activity level.  · Other health conditions you have, such as heart or kidney disease.  How do carbohydrates affect me?  Carbohydrates, also called carbs, affect your blood glucose level more than any other type of food. Eating carbs naturally raises the amount of glucose in your blood. Carb counting is a method for keeping track of how many carbs you eat. Counting carbs is important to keep your blood glucose at a healthy level, especially if you use insulin or take certain oral diabetes medicines.  It is important to know how many carbs you can safely have in each meal. This is different for every person. Your dietitian can help you calculate how many carbs you should have at each meal and for each snack.  Foods that contain carbs include:  · Bread, cereal, rice, pasta, and crackers.  · Potatoes and corn.  · Peas, beans, and lentils.  · Milk and yogurt.  · Fruit and juice.  · Desserts, such as cakes, cookies, ice cream, and candy.  How does alcohol affect me?  Alcohol can cause a sudden decrease in blood glucose (hypoglycemia),  especially if you use insulin or take certain oral diabetes medicines. Hypoglycemia can be a life-threatening condition. Symptoms of hypoglycemia (sleepiness, dizziness, and confusion) are similar to symptoms of having too much alcohol.  If your health care provider says that alcohol is safe for you, follow these guidelines:  · Limit alcohol intake to no more than 1 drink per day for nonpregnant women and 2 drinks per day for men. One drink equals 12 oz of beer, 5 oz of wine, or 1½ oz of hard liquor.  · Do not drink on an empty stomach.  · Keep yourself hydrated with water, diet soda, or unsweetened iced tea.  · Keep in mind that regular soda, juice, and other mixers may contain a lot of sugar and must be counted as carbs.  What are tips for following this plan?    Reading food labels  · Start by checking the serving size on the "Nutrition Facts" label of packaged foods and drinks. The amount of calories, carbs, fats, and other nutrients listed on the label is based on one serving of the item. Many items contain more than one serving per package.  · Check the total grams (g) of carbs in one serving. You can calculate the number of servings of carbs in one serving by dividing the total carbs by 15. For example, if a food has 30 g of total carbs, it would be equal to 2   servings of carbs.  · Check the number of grams (g) of saturated and trans fats in one serving. Choose foods that have low or no amount of these fats.  · Check the number of milligrams (mg) of salt (sodium) in one serving. Most people should limit total sodium intake to less than 2,300 mg per day.  · Always check the nutrition information of foods labeled as "low-fat" or "nonfat". These foods may be higher in added sugar or refined carbs and should be avoided.  · Talk to your dietitian to identify your daily goals for nutrients listed on the label.  Shopping  · Avoid buying canned, premade, or processed foods. These foods tend to be high in fat, sodium,  and added sugar.  · Shop around the outside edge of the grocery store. This includes fresh fruits and vegetables, bulk grains, fresh meats, and fresh dairy.  Cooking  · Use low-heat cooking methods, such as baking, instead of high-heat cooking methods like deep frying.  · Cook using healthy oils, such as olive, canola, or sunflower oil.  · Avoid cooking with butter, cream, or high-fat meats.  Meal planning  · Eat meals and snacks regularly, preferably at the same times every day. Avoid going long periods of time without eating.  · Eat foods high in fiber, such as fresh fruits, vegetables, beans, and whole grains. Talk to your dietitian about how many servings of carbs you can eat at each meal.  · Eat 4-6 ounces (oz) of lean protein each day, such as lean meat, chicken, fish, eggs, or tofu. One oz of lean protein is equal to:  ? 1 oz of meat, chicken, or fish.  ? 1 egg.  ? ¼ cup of tofu.  · Eat some foods each day that contain healthy fats, such as avocado, nuts, seeds, and fish.  Lifestyle  · Check your blood glucose regularly.  · Exercise regularly as told by your health care provider. This may include:  ? 150 minutes of moderate-intensity or vigorous-intensity exercise each week. This could be brisk walking, biking, or water aerobics.  ? Stretching and doing strength exercises, such as yoga or weightlifting, at least 2 times a week.  · Take medicines as told by your health care provider.  · Do not use any products that contain nicotine or tobacco, such as cigarettes and e-cigarettes. If you need help quitting, ask your health care provider.  · Work with a counselor or diabetes educator to identify strategies to manage stress and any emotional and social challenges.  Questions to ask a health care provider  · Do I need to meet with a diabetes educator?  · Do I need to meet with a dietitian?  · What number can I call if I have questions?  · When are the best times to check my blood glucose?  Where to find more  information:  · American Diabetes Association: diabetes.org  · Academy of Nutrition and Dietetics: www.eatright.org  · National Institute of Diabetes and Digestive and Kidney Diseases (NIH): www.niddk.nih.gov  Summary  · A healthy meal plan will help you control your blood glucose and maintain a healthy lifestyle.  · Working with a diet and nutrition specialist (dietitian) can help you make a meal plan that is best for you.  · Keep in mind that carbohydrates (carbs) and alcohol have immediate effects on your blood glucose levels. It is important to count carbs and to use alcohol carefully.  This information is not intended to   replace advice given to you by your health care provider. Make sure you discuss any questions you have with your health care provider.  Document Released: 04/24/2005 Document Revised: 02/25/2017 Document Reviewed: 09/01/2016  Elsevier Interactive Patient Education © 2019 Elsevier Inc.

## 2019-01-17 NOTE — Progress Notes (Signed)
Virtual Visit via Telephone Note  I connected with [name], on 01/17/2019 at 3:05 PM by telephone due to the COVID-19 pandemic and verified that I am speaking with the correct person using two identifiers.   Consent: I discussed the limitations, risks, security and privacy concerns of performing an evaluation and management service by telephone and the availability of in person appointments. I also discussed with the patient that there may be a patient responsible charge related to this service. The patient expressed understanding and agreed to proceed.   Location of Patient: Patient's home  Location of Provider: Clinic   Persons participating in Telemedicine visit: Lemont Sitzmann Farrington-CMA Dr. Felecia Shelling     History of Present Illness: Taylor Hughes is a 48 year old male with a history of type 2 diabetes mellitus (A1c of 11.4 from 11/2018), L fifth toe ray amputation, hypertension, neck abscess who is seen for follow-up visit today. Last month he had a hospitalization at [facility] for acute encephalopathy secondary to hypoglycemia after insulin overdose. Today he reports doing well and has been compliant with his current insulin dose and he is also on Humalog sliding scale but is vague about the instructions he received.  I had previously given him a sliding scale instruction on his Humalog with explicit instructions (which he states he never received from the pharmacy) however on discharge he received a different sliding scale instruction and adjusts his Humalog dose depending on when his sugar is high although but he is unable to provide exact numbers which are described as high and low and just uses his discretion.  Sometimes his blood sugars go up to 300 one hour after lunch, fasting sugars are in the low 100s.  He is scheduled for incision and drainage of his neck abscess which has not improved as he feels pressure in his neck which radiates to his left  shoulder.   Past Medical History:  Diagnosis Date  . Diabetes mellitus without complication ([place])   . Wears glasses   . Wound, open, foot    left diabetic    No Known Allergies  Current Outpatient Medications on File Prior to Visit  Medication Sig Dispense Refill  . aspirin EC 81 MG tablet Take 1 tablet (81 mg total) by mouth daily. 30 tablet 11  . blood glucose meter kit and supplies KIT Dispense based on patient and insurance preference. Use up to four times daily as directed. (FOR ICD-9 250.00, 250.01). 1 each 0  . cyclobenzaprine (FLEXERIL) 10 MG tablet Take 1 tablet (10 mg total) by mouth 2 (two) times daily as needed for muscle spasms. (Patient taking differently: Take 10 mg by mouth See admin instructions. Take 1 tablet (10 mg) by mouth scheduled in the afternoon & may taken an additional tablet if needed for pain.) 60 tablet 1  . glucose blood test strip Needs supply of TRUEMETRIC STRIPS Use as instructed 100 each 2  . insulin detemir (LEVEMIR) 100 UNIT/ML injection Inject 0.2 mLs (20 Units total) into the skin daily. 30 mL 3  . insulin lispro (HUMALOG) 100 UNIT/ML injection Inject 2-12 Units into the skin See admin instructions. Inject 12 units daily with lunch & may take an additional 2-3 units if needed for blood sugars greater than 395.    . Insulin Pen Needle 32G X 5 MM MISC Use with levemir pen as prescribed 100 each 0  . metoprolol tartrate (LOPRESSOR) 50 MG tablet Take 1 tablet (50 mg total) by mouth 2 (two) times  daily for 30 days. 180 tablet 1  . ibuprofen (ADVIL) 200 MG tablet Take 400 mg by mouth every 6 (six) hours as needed for moderate pain.    Marland Kitchen insulin aspart (NOVOLOG FLEXPEN) 100 UNIT/ML FlexPen 0-12 units three times daily before meals according to sliding scale (Patient not taking: Reported on 01/17/2019) 15 mL 11  . pantoprazole (PROTONIX) 40 MG tablet Take 1 tablet (40 mg total) by mouth daily. (Patient not taking: Reported on 01/17/2019) 30 tablet 0   No current  facility-administered medications on file prior to visit.     Observations/Objective: Awake, alert, oriented x3 Not in acute distress  Lab Results  Component Value Date   HGBA1C 11.4 (H) 11/25/2018    Assessment and Plan: 1. Type 2 diabetes mellitus with other specified complication, with long-term current use of insulin (HCC) Uncontrolled with A1c of 11.4 He has been compliant with Levemir however has been confused about his NovoLog sliding scale dose Advised to sign up for my chart so sliding scale instructions can be conveyed I will see him in person after the incision and drainage of his abscess so we can review his blood sugar log and adjust his regimen accordingly  2. Neck abscess Scheduled for incision and drainage by ENT in 2 days   Follow Up Instructions: Return in about 1 month (around 02/16/2019) for Diabetes mellitus.    I discussed the assessment and treatment plan with the patient. The patient was provided an opportunity to ask questions and all were answered. The patient agreed with the plan and demonstrated an understanding of the instructions.   The patient was advised to call back or seek an in-person evaluation if the symptoms worsen or if the condition fails to improve as anticipated.     I provided 15 minutes total of non-face-to-face time during this encounter including median intraservice time, reviewing previous notes, labs, imaging, medications, management and patient verbalized understanding.     Charlott Rakes, MD, FAAFP. [facility] [place], [place]   01/17/2019, 3:05 PM

## 2019-01-18 ENCOUNTER — Encounter (HOSPITAL_COMMUNITY): Payer: Self-pay | Admitting: *Deleted

## 2019-01-18 MED ORDER — DEXTROSE 5 % IV SOLN
3.0000 g | INTRAVENOUS | Status: AC
  Administered 2019-01-19: 09:00:00 3 g via INTRAVENOUS
  Filled 2019-01-18: qty 3

## 2019-01-18 NOTE — Progress Notes (Addendum)
Spoke with Taylor Hughes for pre-op call. Taylor Hughes diagnosed with A-fib in May. Has been on Metoprolol, last dose he thinks will be tonight. Taylor Hughes denies being diagnosed with HTN. Taylor Hughes states he only gets an irregular heart rate when he is in the hospital. Taylor Hughes is a type 2 diabetic. Last A1C was 11.4 on 11/25/18. Taylor Hughes states his fasting blood sugar is usually between 128-275. Taylor Hughes instructed to take 1/2 of his regular dose of Levemir Insulin in the AM. He will take 10 units. Instructed Taylor Hughes to check his blood sugar when he gets up in the morning. If blood sugar is >220 take 1/2 of usual correction dose of Humalog insulin. If blood sugar is 70 or below, treat with 1/2 cup of clear juice (apple or cranberry) and recheck blood sugar 15 minutes after drinking juice. Taylor Hughes voiced understanding. Taylor Hughes states he was not instructed by Dr. Redmond Baseman to stop Aspirin.   Taylor Hughes had Covid 19 test done yesterday, it is negative. Taylor Hughes states he has self quarantined since test and will continue to do so.   Coronavirus Screening  Have you experienced the following symptoms:  Cough NO Fever (>100.43F) NO Runny nose NO Sore throat NO Difficulty breathing/shortness of breath  NO  Have you or a family member traveled in the last 14 days and where? NO    Patient reminded that hospital visitation restrictions are in effect and the importance of the restrictions.

## 2019-01-18 NOTE — Anesthesia Preprocedure Evaluation (Addendum)
Anesthesia Evaluation  Patient identified by MRN, date of birth, ID band Patient awake    Reviewed: Allergy & Precautions, NPO status , Patient's Chart, lab work & pertinent test results, reviewed documented beta blocker date and time   Airway Mallampati: III  TM Distance: >3 FB Neck ROM: Full    Dental no notable dental hx. (+) Teeth Intact, Dental Advisory Given   Pulmonary neg pulmonary ROS,    Pulmonary exam normal breath sounds clear to auscultation       Cardiovascular hypertension, Pt. on home beta blockers and Pt. on medications Normal cardiovascular exam+ dysrhythmias Atrial Fibrillation  Rhythm:Regular Rate:Normal  TTE 2019 EF 65-70%, no valvular abnormalities   Neuro/Psych negative neurological ROS  negative psych ROS   GI/Hepatic Neg liver ROS, GERD  Medicated,  Endo/Other  negative endocrine ROSdiabetes, Poorly Controlled, Insulin Dependent  Renal/GU negative Renal ROS  negative genitourinary   Musculoskeletal negative musculoskeletal ROS (+)   Abdominal   Peds  Hematology negative hematology ROS (+)   Anesthesia Other Findings   Reproductive/Obstetrics                           Anesthesia Physical Anesthesia Plan  ASA: III  Anesthesia Plan: General   Post-op Pain Management:    Induction: Intravenous  PONV Risk Score and Plan: 2 and Ondansetron and Midazolam  Airway Management Planned: Oral ETT  Additional Equipment:   Intra-op Plan:   Post-operative Plan: Extubation in OR  Informed Consent: I have reviewed the patients History and Physical, chart, labs and discussed the procedure including the risks, benefits and alternatives for the proposed anesthesia with the patient or authorized representative who has indicated his/her understanding and acceptance.     Dental advisory given  Plan Discussed with: CRNA  Anesthesia Plan Comments: (Admitted 4/13-4/20 for  DKA and Left neck abscess complicated with sepsis. I&D by ENT on 11/22/2018. Per discharge summary, he was noted to have episode of afib with RVR, however there is no EKG documentation of this. His metoprolol was increased. No prior hx of afib. Subsequent EKGs from ED visits have shown sinus tachycardia.   Over the past 6 months he has also had additional admissions for both DKA and hypoglycemia due to inconsistent insulin use. Last A1c 11.4 on 11/25/18.   )       Anesthesia Quick Evaluation

## 2019-01-19 ENCOUNTER — Ambulatory Visit (HOSPITAL_COMMUNITY): Payer: Self-pay | Admitting: Physician Assistant

## 2019-01-19 ENCOUNTER — Ambulatory Visit (HOSPITAL_COMMUNITY)
Admission: RE | Admit: 2019-01-19 | Discharge: 2019-01-19 | Disposition: A | Discharge status: Home / Self Care | Payer: Self-pay | Attending: Otolaryngology | Admitting: Otolaryngology

## 2019-01-19 ENCOUNTER — Encounter (HOSPITAL_COMMUNITY)
Admission: RE | Disposition: A | Discharge status: Home / Self Care | Payer: Self-pay | Source: Home / Self Care | Attending: Otolaryngology

## 2019-01-19 ENCOUNTER — Encounter (HOSPITAL_COMMUNITY): Payer: Self-pay

## 2019-01-19 ENCOUNTER — Other Ambulatory Visit: Payer: Self-pay

## 2019-01-19 DIAGNOSIS — I4891 Unspecified atrial fibrillation: Secondary | ICD-10-CM | POA: Insufficient documentation

## 2019-01-19 DIAGNOSIS — L02213 Cutaneous abscess of chest wall: Secondary | ICD-10-CM | POA: Insufficient documentation

## 2019-01-19 DIAGNOSIS — I10 Essential (primary) hypertension: Secondary | ICD-10-CM | POA: Insufficient documentation

## 2019-01-19 DIAGNOSIS — Z7982 Long term (current) use of aspirin: Secondary | ICD-10-CM | POA: Insufficient documentation

## 2019-01-19 DIAGNOSIS — Z79899 Other long term (current) drug therapy: Secondary | ICD-10-CM | POA: Insufficient documentation

## 2019-01-19 DIAGNOSIS — Z794 Long term (current) use of insulin: Secondary | ICD-10-CM | POA: Insufficient documentation

## 2019-01-19 DIAGNOSIS — E119 Type 2 diabetes mellitus without complications: Secondary | ICD-10-CM | POA: Insufficient documentation

## 2019-01-19 HISTORY — DX: Gastro-esophageal reflux disease without esophagitis: K21.9

## 2019-01-19 HISTORY — DX: Unspecified atrial fibrillation: I48.91

## 2019-01-19 HISTORY — PX: INCISION AND DRAINAGE ABSCESS: SHX5864

## 2019-01-19 LAB — GLUCOSE, CAPILLARY
Glucose-Capillary: 64 mg/dL — ABNORMAL LOW (ref 70–99)
Glucose-Capillary: 126 mg/dL — ABNORMAL HIGH (ref 70–99)
Glucose-Capillary: 75 mg/dL (ref 70–99)
Glucose-Capillary: 79 mg/dL (ref 70–99)

## 2019-01-19 LAB — BASIC METABOLIC PANEL
Anion gap: 10 (ref 5–15)
BUN: 18 mg/dL (ref 6–20)
CO2: 25 mmol/L (ref 22–32)
Calcium: 8.9 mg/dL (ref 8.9–10.3)
Chloride: 105 mmol/L (ref 98–111)
Creatinine, Ser: 0.73 mg/dL (ref 0.61–1.24)
GFR calc Af Amer: >60 mL/min (ref >60)
GFR calc non Af Amer: >60 mL/min (ref >60)
Glucose, Bld: 75 mg/dL (ref 70–99)
Potassium: 3.6 mmol/L (ref 3.5–5.1)
Sodium: 140 mmol/L (ref 135–145)

## 2019-01-19 LAB — CBC
HCT: 32.1 % — ABNORMAL LOW (ref 39.0–52.0)
Hemoglobin: 10.2 g/dL — ABNORMAL LOW (ref 13.0–17.0)
MCH: 28.1 pg (ref 26.0–34.0)
MCHC: 31.8 g/dL (ref 30.0–36.0)
MCV: 88.4 fL (ref 80.0–100.0)
Platelets: 384 10*3/uL (ref 150–400)
RBC: 3.63 MIL/uL — ABNORMAL LOW (ref 4.22–5.81)
RDW: 15 % (ref 11.5–15.5)
WBC: 4.2 10*3/uL (ref 4.0–10.5)
nRBC: 0 % (ref 0.0–0.2)

## 2019-01-19 SURGERY — INCISION AND DRAINAGE, ABSCESS
Anesthesia: General | Site: Chest | Laterality: Left

## 2019-01-19 MED ORDER — HYDROCODONE-ACETAMINOPHEN 5-325 MG PO TABS: 1.0000 | ORAL_TABLET | Freq: Four times a day (QID) | ORAL | 0 refills | Status: DC | PRN

## 2019-01-19 MED ORDER — SUGAMMADEX SODIUM 500 MG/5ML IV SOLN
INTRAVENOUS | Status: AC
  Filled 2019-01-19: qty 5

## 2019-01-19 MED ORDER — LIDOCAINE 2% (20 MG/ML) 5 ML SYRINGE
INTRAMUSCULAR | Status: AC
  Filled 2019-01-19: qty 5

## 2019-01-19 MED ORDER — ONDANSETRON HCL 4 MG/2ML IJ SOLN
INTRAMUSCULAR | Status: DC | PRN
  Administered 2019-01-19: 4 mg via INTRAVENOUS

## 2019-01-19 MED ORDER — ACETAMINOPHEN 500 MG PO TABS
1000.0000 mg | ORAL_TABLET | Freq: Once | ORAL | Status: AC
  Administered 2019-01-19: 08:00:00 1000 mg via ORAL
  Filled 2019-01-19: qty 2

## 2019-01-19 MED ORDER — FENTANYL CITRATE (PF) 100 MCG/2ML IJ SOLN
INTRAMUSCULAR | Status: DC | PRN
  Administered 2019-01-19: 50 ug via INTRAVENOUS

## 2019-01-19 MED ORDER — LIDOCAINE 2% (20 MG/ML) 5 ML SYRINGE
INTRAMUSCULAR | Status: DC | PRN
  Administered 2019-01-19: 100 mg via INTRAVENOUS

## 2019-01-19 MED ORDER — MIDAZOLAM HCL 5 MG/5ML IJ SOLN
INTRAMUSCULAR | Status: DC | PRN
  Administered 2019-01-19: 2 mg via INTRAVENOUS

## 2019-01-19 MED ORDER — FENTANYL CITRATE (PF) 100 MCG/2ML IJ SOLN
25.0000 ug | INTRAMUSCULAR | Status: DC | PRN
  Administered 2019-01-19 (×2): 25 ug via INTRAVENOUS

## 2019-01-19 MED ORDER — ROCURONIUM BROMIDE 10 MG/ML (PF) SYRINGE
PREFILLED_SYRINGE | INTRAVENOUS | Status: AC
  Filled 2019-01-19: qty 10

## 2019-01-19 MED ORDER — ROCURONIUM BROMIDE 50 MG/5ML IV SOSY
PREFILLED_SYRINGE | INTRAVENOUS | Status: DC | PRN
  Administered 2019-01-19: 50 mg via INTRAVENOUS

## 2019-01-19 MED ORDER — PROPOFOL 10 MG/ML IV BOLUS
INTRAVENOUS | Status: DC | PRN
  Administered 2019-01-19: 150 mg via INTRAVENOUS

## 2019-01-19 MED ORDER — SUGAMMADEX SODIUM 200 MG/2ML IV SOLN
INTRAVENOUS | Status: DC | PRN
  Administered 2019-01-19: 500 mg via INTRAVENOUS

## 2019-01-19 MED ORDER — DEXTROSE 50 % IV SOLN
25.0000 mL | Freq: Once | INTRAVENOUS | Status: AC
  Administered 2019-01-19: 08:00:00 25 mL via INTRAVENOUS

## 2019-01-19 MED ORDER — PROPOFOL 1000 MG/100ML IV EMUL
INTRAVENOUS | Status: AC
  Filled 2019-01-19: qty 200

## 2019-01-19 MED ORDER — PROPOFOL 10 MG/ML IV BOLUS
INTRAVENOUS | Status: AC
  Filled 2019-01-19: qty 40

## 2019-01-19 MED ORDER — DEXAMETHASONE SODIUM PHOSPHATE 10 MG/ML IJ SOLN
INTRAMUSCULAR | Status: AC
  Filled 2019-01-19: qty 1

## 2019-01-19 MED ORDER — LIDOCAINE-EPINEPHRINE 1 %-1:100000 IJ SOLN
INTRAMUSCULAR | Status: AC
  Filled 2019-01-19: qty 1

## 2019-01-19 MED ORDER — BUPIVACAINE-EPINEPHRINE 0.25% -1:200000 IJ SOLN
INTRAMUSCULAR | Status: AC
  Filled 2019-01-19: qty 1

## 2019-01-19 MED ORDER — FENTANYL CITRATE (PF) 250 MCG/5ML IJ SOLN
INTRAMUSCULAR | Status: AC
  Filled 2019-01-19: qty 5

## 2019-01-19 MED ORDER — PROPOFOL 500 MG/50ML IV EMUL
INTRAVENOUS | Status: AC
  Filled 2019-01-19: qty 200

## 2019-01-19 MED ORDER — LIDOCAINE-EPINEPHRINE 1 %-1:100000 IJ SOLN
INTRAMUSCULAR | Status: DC | PRN
  Administered 2019-01-19: 1 mL

## 2019-01-19 MED ORDER — ONDANSETRON HCL 4 MG/2ML IJ SOLN
INTRAMUSCULAR | Status: AC
  Filled 2019-01-19: qty 2

## 2019-01-19 MED ORDER — DEXTROSE 50 % IV SOLN
INTRAVENOUS | Status: AC
  Administered 2019-01-19: 25 mL via INTRAVENOUS
  Filled 2019-01-19: qty 50

## 2019-01-19 MED ORDER — SODIUM CHLORIDE 0.9 % IR SOLN
Status: DC | PRN
  Administered 2019-01-19: 1000 mL

## 2019-01-19 MED ORDER — LACTATED RINGERS IV SOLN
INTRAVENOUS | Status: DC | PRN
  Administered 2019-01-19: 08:00:00 via INTRAVENOUS

## 2019-01-19 MED ORDER — MIDAZOLAM HCL 2 MG/2ML IJ SOLN
INTRAMUSCULAR | Status: AC
  Filled 2019-01-19: qty 2

## 2019-01-19 MED ORDER — FENTANYL CITRATE (PF) 100 MCG/2ML IJ SOLN
INTRAMUSCULAR | Status: AC
  Administered 2019-01-19: 10:00:00 25 ug via INTRAVENOUS
  Filled 2019-01-19: qty 2

## 2019-01-19 MED ORDER — SUCCINYLCHOLINE CHLORIDE 200 MG/10ML IV SOSY
PREFILLED_SYRINGE | INTRAVENOUS | Status: AC
  Filled 2019-01-19: qty 10

## 2019-01-19 MED ORDER — LIDOCAINE-EPINEPHRINE 2 %-1:100000 IJ SOLN
INTRAMUSCULAR | Status: AC
  Filled 2019-01-19: qty 1

## 2019-01-19 MED ORDER — PHENYLEPHRINE HCL-NACL 10-0.9 MG/250ML-% IV SOLN
INTRAVENOUS | Status: AC
  Filled 2019-01-19: qty 500

## 2019-01-19 SURGICAL SUPPLY — 39 items
BLADE SURG 15 STRL LF DISP TIS (BLADE) ×1 IMPLANT
BLADE SURG 15 STRL SS (BLADE) ×1
BNDG CONFORM 2 STRL LF (GAUZE/BANDAGES/DRESSINGS) IMPLANT
CANISTER SUCTION WELLS/JOHNSON (MISCELLANEOUS) ×2 IMPLANT
CATH ROBINSON RED A/P 12FR (CATHETERS) ×2 IMPLANT
COVER SURGICAL LIGHT HANDLE (MISCELLANEOUS) ×4 IMPLANT
COVER WAND RF STERILE (DRAPES) IMPLANT
CRADLE DONUT ADULT HEAD (MISCELLANEOUS) ×2 IMPLANT
DRAIN PENROSE 1/4X12 LTX STRL (WOUND CARE) IMPLANT
DRAPE HALF SHEET 40X57 (DRAPES) ×2 IMPLANT
DRAPE ORTHO SPLIT 77X108 STRL (DRAPES) ×1
DRAPE SURG ORHT 6 SPLT 77X108 (DRAPES) ×1 IMPLANT
DRSG PAD ABDOMINAL 8X10 ST (GAUZE/BANDAGES/DRESSINGS) IMPLANT
ELECT COATED BLADE 2.86 ST (ELECTRODE) ×2 IMPLANT
ELECT REM PT RETURN 9FT ADLT (ELECTROSURGICAL) ×2
ELECTRODE REM PT RTRN 9FT ADLT (ELECTROSURGICAL) ×1 IMPLANT
GAUZE 4X4 16PLY RFD (DISPOSABLE) ×2 IMPLANT
GAUZE SPONGE 4X4 12PLY STRL (GAUZE/BANDAGES/DRESSINGS) ×4 IMPLANT
GLOVE BIO SURGEON STRL SZ7.5 (GLOVE) ×2 IMPLANT
GOWN STRL REUS W/ TWL LRG LVL3 (GOWN DISPOSABLE) ×2 IMPLANT
GOWN STRL REUS W/TWL LRG LVL3 (GOWN DISPOSABLE) ×2
KIT BASIN OR (CUSTOM PROCEDURE TRAY) ×2 IMPLANT
KIT TURNOVER KIT B (KITS) ×2 IMPLANT
MARKER SKIN DUAL TIP RULER LAB (MISCELLANEOUS) IMPLANT
NEEDLE HYPO 25GX1X1/2 BEV (NEEDLE) ×2 IMPLANT
NS IRRIG 1000ML POUR BTL (IV SOLUTION) ×2 IMPLANT
PACK SURGICAL SETUP 50X90 (CUSTOM PROCEDURE TRAY) ×2 IMPLANT
PAD ARMBOARD 7.5X6 YLW CONV (MISCELLANEOUS) ×4 IMPLANT
PENCIL BUTTON HOLSTER BLD 10FT (ELECTRODE) ×2 IMPLANT
RUBBERBAND STERILE (MISCELLANEOUS) IMPLANT
SUT ETHILON 2 0 FS 18 (SUTURE) IMPLANT
SUT SILK 2 0 SH CR/8 (SUTURE) IMPLANT
SWAB COLLECTION DEVICE MRSA (MISCELLANEOUS) IMPLANT
SWAB CULTURE ESWAB REG 1ML (MISCELLANEOUS) IMPLANT
SYR BULB IRRIGATION 50ML (SYRINGE) ×2 IMPLANT
SYR CONTROL 10ML LL (SYRINGE) ×2 IMPLANT
TAPE CLOTH SURG 4X10 WHT LF (GAUZE/BANDAGES/DRESSINGS) ×2 IMPLANT
TUBE CONNECTING 12X1/4 (SUCTIONS) ×2 IMPLANT
YANKAUER SUCT BULB TIP NO VENT (SUCTIONS) ×2 IMPLANT

## 2019-01-19 NOTE — Anesthesia Postprocedure Evaluation (Signed)
Anesthesia Post Note  Patient: Taylor Hughes  Procedure(s) Performed: INCISION AND DRAINAGE ABSCESS upper chest (Left Chest)     Patient location during evaluation: PACU Anesthesia Type: General Level of consciousness: awake and alert Pain management: pain level controlled Vital Signs Assessment: post-procedure vital signs reviewed and stable Respiratory status: spontaneous breathing, nonlabored ventilation, respiratory function stable and patient connected to nasal cannula oxygen Cardiovascular status: blood pressure returned to baseline and stable Postop Assessment: no apparent nausea or vomiting Anesthetic complications: no    Last Vitals:  Vitals:   01/19/19 1019 01/19/19 1023  BP:  (!) 141/93  Pulse:  (!) 102  Resp:    Temp: 36.5 C   SpO2:  97%    Last Pain:  Vitals:   01/19/19 1023  TempSrc:   PainSc: 0-No pain                 Jalaya Sarver L Yurem Viner

## 2019-01-19 NOTE — Transfer of Care (Signed)
Immediate Anesthesia Transfer of Care Note  Patient: Taylor Hughes  Procedure(s) Performed: INCISION AND DRAINAGE ABSCESS upper chest (Left Chest)  Patient Location: PACU  Anesthesia Type:General  Level of Consciousness: awake, alert , oriented and sedated  Airway & Oxygen Therapy: Patient Spontanous Breathing and Patient connected to nasal cannula oxygen  Post-op Assessment: Report given to RN, Post -op Vital signs reviewed and stable and Patient moving all extremities  Post vital signs: Reviewed and stable  Last Vitals:  Vitals Value Taken Time  BP 122/83 01/19/2019  9:26 AM  Temp    Pulse 104 01/19/2019  9:29 AM  Resp 17 01/19/2019  9:29 AM  SpO2 100 % 01/19/2019  9:29 AM  Vitals shown include unvalidated device data.  Last Pain:  Vitals:   01/19/19 0728  TempSrc:   PainSc: 5       Patients Stated Pain Goal: 2 (39/53/20 2334)  Complications: No apparent anesthesia complications

## 2019-01-19 NOTE — H&P (Signed)
Taylor Hughes is an 48 y.o. male.   Chief Complaint: Left neck/upper chest abscess HPI: 48 year old male with diabetes underwent incision and drainage of left neck abscess/necrotizing fasciitis in April.  He was in DKA at the time.  The neck has improved but he later developed an indurated area over the left upper sternum that has not improved with antibiotics.  He presents for surgical management.  Past Medical History:  Diagnosis Date  . Atrial fibrillation ([place])   . Diabetes mellitus without complication (HCC)    Type 2  . GERD (gastroesophageal reflux disease)   . Wears glasses   . Wound, open, foot    left diabetic     Past Surgical History:  Procedure Laterality Date  . AMPUTATION Left 08/20/2018   Procedure: LEFT FOOT IRRIGATON AND DEBRIDEMENT, 5TH RAY AMPUTATION;  Surgeon: Newt Minion, MD;  Location: [place];  Service: Orthopedics;  Laterality: Left;  . FINGER SURGERY Right 2016   I&D  small finger  . I&D EXTREMITY Left 07/30/2018   Procedure: IRRIGATION AND DEBRIDEMENT LEFT FOOT WITH POSSIBLE AMPUTATION OF FIFTH TOE;  Surgeon: Mcarthur Rossetti, MD;  Location: [facility];  Service: Orthopedics;  Laterality: Left;  . MINOR IRRIGATION AND DEBRIDEMENT OF WOUND N/A 11/22/2018   Procedure: INCISION AND DRAINAGE OF NECK ABSCESS;  Surgeon: Melida Quitter, MD;  Location: [facility];  Service: ENT;  Laterality: N/A;    Family History  Problem Relation Age of Onset  . Diabetes Mother    Social History:  reports that he has never smoked. He has never used smokeless tobacco. He reports that he does not drink alcohol or use drugs.  Allergies: No Known Allergies  Medications Prior to Admission  Medication Sig Dispense Refill  . aspirin EC 81 MG tablet Take 1 tablet (81 mg total) by mouth daily. 30 tablet 11  . cyclobenzaprine (FLEXERIL) 10 MG tablet Take 1 tablet (10 mg total) by mouth 2 (two) times daily as needed for muscle spasms. (Patient taking differently: Take 10 mg by mouth See  admin instructions. Take 1 tablet (10 mg) by mouth scheduled in the afternoon & may taken an additional tablet if needed for pain.) 60 tablet 1  . ibuprofen (ADVIL) 200 MG tablet Take 400 mg by mouth every 6 (six) hours as needed for moderate pain.    Marland Kitchen insulin detemir (LEVEMIR) 100 UNIT/ML injection Inject 0.2 mLs (20 Units total) into the skin daily. 30 mL 3  . insulin lispro (HUMALOG) 100 UNIT/ML injection Inject 2-12 Units into the skin See admin instructions. Inject 12 units daily with lunch & may take an additional 2-3 units if needed for blood sugars greater than 395.    . metoprolol tartrate (LOPRESSOR) 50 MG tablet Take 1 tablet (50 mg total) by mouth 2 (two) times daily for 30 days. 180 tablet 1  . blood glucose meter kit and supplies KIT Dispense based on patient and insurance preference. Use up to four times daily as directed. (FOR ICD-9 250.00, 250.01). 1 each 0  . glucose blood test strip Needs supply of TRUEMETRIC STRIPS Use as instructed 100 each 2  . insulin aspart (NOVOLOG FLEXPEN) 100 UNIT/ML FlexPen 0-12 units three times daily before meals according to sliding scale (Patient not taking: Reported on 01/17/2019) 15 mL 11  . Insulin Pen Needle 32G X 5 MM MISC Use with levemir pen as prescribed 100 each 0  . pantoprazole (PROTONIX) 40 MG tablet Take 1 tablet (40 mg total) by  mouth daily. (Patient not taking: Reported on 01/17/2019) 30 tablet 0    Results for orders placed or performed during the hospital encounter of 01/19/19 (from the past 48 hour(s))  Glucose, capillary     Status: Abnormal   Collection Time: 01/19/19  6:55 AM  Result Value Ref Range   Glucose-Capillary 64 (L) 70 - 99 mg/dL   Comment 1 Notify RN    Comment 2 Document in Chart   Basic metabolic panel     Status: None   Collection Time: 01/19/19  6:56 AM  Result Value Ref Range   Sodium 140 135 - 145 mmol/L   Potassium 3.6 3.5 - 5.1 mmol/L   Chloride 105 98 - 111 mmol/L   CO2 25 22 - 32 mmol/L   Glucose, Bld 75  70 - 99 mg/dL   BUN 18 6 - 20 mg/dL   Creatinine, Ser 0.73 0.61 - 1.24 mg/dL   Calcium 8.9 8.9 - 10.3 mg/dL   GFR calc non Af Amer >60 >60 mL/min   GFR calc Af Amer >60 >60 mL/min   Anion gap 10 5 - 15    Comment: Performed at [facility], [place] [address] [place], [place] [address]  CBC     Status: Abnormal   Collection Time: 01/19/19  6:56 AM  Result Value Ref Range   WBC 4.2 4.0 - 10.5 K/uL   RBC 3.63 (L) 4.22 - 5.81 MIL/uL   Hemoglobin 10.2 (L) 13.0 - 17.0 g/dL   HCT 32.1 (L) 39.0 - 52.0 %   MCV 88.4 80.0 - 100.0 fL   MCH 28.1 26.0 - 34.0 pg   MCHC 31.8 30.0 - 36.0 g/dL   RDW 15.0 11.5 - 15.5 %   Platelets 384 150 - 400 K/uL   nRBC 0.0 0.0 - 0.2 %    Comment: Performed at [facility], [place] [address], [place], [place] [address]   No results found.  Review of Systems  All other systems reviewed and are negative.   Blood pressure 136/78, pulse (!) 108, temperature 97.9 F (36.6 C), temperature source Oral, resp. rate 18, height _0  (1.803 m), weight 111.1 kg, SpO2 100 %. Physical Exam  Constitutional: He is oriented to person, place, and time. He appears well-developed and well-nourished. No distress.  HENT:  Head: Normocephalic and atraumatic.  Right Ear: External ear normal.  Left Ear: External ear normal.  Nose: Nose normal.  Mouth/Throat: Oropharynx is clear and moist.  Eyes: Pupils are equal, round, and reactive to light. Conjunctivae and EOM are normal.  Neck:  Left lower neck wound with granulation.  6 cm area of induration overlying the left upper sternum.  Cardiovascular: Normal rate.  Respiratory: Effort normal.  Musculoskeletal: Normal range of motion.  Neurological: He is alert and oriented to person, place, and time. No cranial nerve deficit.  Skin: Skin is warm and dry.  Psychiatric: He has a normal mood and affect. His behavior is normal. Judgment and thought content normal.     Assessment/Plan Left neck/upper chest abscess  To OR  for incision and drainage of left neck/upper chest abscess.  Melida Quitter, MD 01/19/2019, 8:16 AM

## 2019-01-19 NOTE — Brief Op Note (Signed)
01/19/2019  9:10 AM  PATIENT:  Taylor Hughes  48 y.o. male  PRE-OPERATIVE DIAGNOSIS:  left neck abscess  POST-OPERATIVE DIAGNOSIS:  left neck abscess  PROCEDURE:  Procedure(s): INCISION AND DRAINAGE ABSCESS LEFT NECK (Left)  SURGEON:  Surgeon(s) and Role:    Melida Quitter, MD - Primary  PHYSICIAN ASSISTANT:   ASSISTANTS: none   ANESTHESIA:   general  EBL: Minimal  BLOOD ADMINISTERED:none  DRAINS: none   LOCAL MEDICATIONS USED:  LIDOCAINE   SPECIMEN:  No Specimen  DISPOSITION OF SPECIMEN:  N/A  COUNTS:  YES  TOURNIQUET:  * No tourniquets in log *  DICTATION: .Other Dictation: Dictation Number [id number]  PLAN OF CARE: Discharge to home after PACU  PATIENT DISPOSITION:  PACU - hemodynamically stable.   Delay start of Pharmacological VTE agent (>24hrs) due to surgical blood loss or risk of bleeding: no

## 2019-01-19 NOTE — Op Note (Signed)
Taylor Hughes, WORD MEDICAL RECORD [id number] ACCOUNT [id number] DATE OF BIRTH:May 23, 1971 FACILITY: [facility] LOCATION: [facility] PHYSICIAN:Jaydyn Bozzo D. Arriyah Madej, MD  OPERATIVE REPORT  DATE OF PROCEDURE:  01/19/2019  PREOPERATIVE DIAGNOSIS:  Left lower neck and upper chest abscess.  POSTOPERATIVE DIAGNOSIS:  Left lower neck and upper chest abscess.  PROCEDURE:  Incision and drainage of left upper tract abscess.  SURGEON:  Melida Quitter, MD  ANESTHESIA:  General endotracheal anesthesia.  COMPLICATIONS:  None.  INDICATIONS:  The patient is a 48 year old male with diabetes who was hospitalized in April in diabetic ketoacidosis and found to have a necrotizing fasciitis of the neck that required surgical debridement and drainage.  He has been performing dressing  changes until more recently when the wound has healed for the most part.  However, he developed an area of induration over the left upper chest below the previous area of infection, and this has not responded to antibiotics.  He presents to the operating  room for surgical management.  FINDINGS:  An incision was made over the left upper chest induration, and a pocket of pus was encountered and was able to be drained and packed.  DESCRIPTION OF PROCEDURE:  The patient was identified in the holding room, informed consent having been obtained with discussion of risks, benefits and alternatives.  The patient was brought to the operative suite and placed on the operating room in  supine position.  Anesthesia was induced.  The patient was intubated by the anesthesia team with use of a GlideScope.  The eyes were taped closed, and the left upper chest was prepped and draped in sterile fashion.  The incision area was injected with 1%  lidocaine with 1:100,000 epinephrine.  The incision was made with a 15-blade scalpel and extended deeply using a hemostat.  Pus was encountered.  The area of induration was then aggressively dissected, bringing  everything to 1 common space.  The depth  of the wound was then copiously irrigated with saline using a red rubber catheter.  A saline dampened Kerlix was then packed down into the wound.  Drapes were removed and the patient was cleaned off.  A dressing was applied over the wound.  He was then  returned to anesthesia for wakeup and was extubated and moved to the recovery room in stable condition.  LN/NUANCE  D:01/19/2019 T:01/19/2019 JOB:[id number]

## 2019-01-19 NOTE — Anesthesia Procedure Notes (Signed)
Procedure Name: Intubation Date/Time: 01/19/2019 8:40 AM Performed by: Scheryl Darter, CRNA Pre-anesthesia Checklist: Patient identified, Emergency Drugs available, Suction available and Patient being monitored Patient Re-evaluated:Patient Re-evaluated prior to induction Oxygen Delivery Method: Circle System Utilized Preoxygenation: Pre-oxygenation with 100% oxygen Induction Type: IV induction Ventilation: Mask ventilation without difficulty Grade View: Grade IV Tube type: Oral Tube size: 7.5 mm Number of attempts: 1 Airway Equipment and Method: Stylet,  Oral airway and Video-laryngoscopy Placement Confirmation: ETT inserted through vocal cords under direct vision,  positive ETCO2 and breath sounds checked- equal and bilateral Secured at: 23 cm Tube secured with: Tape Dental Injury: Teeth and Oropharynx as per pre-operative assessment  Difficulty Due To: Difficulty was anticipated, Difficult Airway- due to large tongue, Difficult Airway- due to reduced neck mobility, Difficult Airway- due to limited oral opening and Difficult Airway- due to anterior larynx Comments: Attempted DL with [name] Heck 3, Mac3, cords reamained closed/ 3rd attempt cords open /Dr Brunetta Jeans

## 2019-01-20 ENCOUNTER — Encounter (HOSPITAL_COMMUNITY): Payer: Self-pay | Admitting: Otolaryngology

## 2019-01-20 MED FILL — METOPROLOL TARTRATE 50 MG T: 50 | 90 days supply | Qty: 180 | Fill #0

## 2019-02-10 ENCOUNTER — Other Ambulatory Visit: Payer: Self-pay | Admitting: Family Medicine

## 2019-02-10 DIAGNOSIS — L0211 Cutaneous abscess of neck: Secondary | ICD-10-CM

## 2019-02-10 MED FILL — ?HUMALOG 100 UNITS/ML KWIKP: 100 | 25 days supply | Qty: 9 | Fill #1

## 2019-02-10 MED FILL — CYCLOBENZAPRINE 10 MG TAB: 10 | 30 days supply | Qty: 60 | Fill #0

## 2019-02-10 MED FILL — !LEVEMIR 100 UNITS/ML VIAL: 100/ML | 42 days supply | Qty: 10 | Fill #1

## 2019-02-10 MED FILL — TRUE METRIX TEST STRIP: 25 days supply | Qty: 100 | Fill #1

## 2019-03-11 ENCOUNTER — Other Ambulatory Visit: Payer: Self-pay | Admitting: Family Medicine

## 2019-03-11 DIAGNOSIS — L0211 Cutaneous abscess of neck: Secondary | ICD-10-CM

## 2019-03-11 MED FILL — ?HUMALOG 100 UNITS/ML KWIKP: 100 | 25 days supply | Qty: 9 | Fill #2

## 2019-03-11 MED FILL — TRUE METRIX TEST STRIP: 25 days supply | Qty: 100 | Fill #2

## 2019-03-15 ENCOUNTER — Other Ambulatory Visit: Payer: Self-pay

## 2019-03-15 ENCOUNTER — Encounter: Payer: Self-pay | Admitting: Family

## 2019-03-15 ENCOUNTER — Ambulatory Visit (INDEPENDENT_AMBULATORY_CARE_PROVIDER_SITE_OTHER): Payer: Self-pay | Admitting: Family

## 2019-03-15 VITALS — Ht 71.0 in | Wt 245.0 lb

## 2019-03-15 DIAGNOSIS — L089 Local infection of the skin and subcutaneous tissue, unspecified: Secondary | ICD-10-CM

## 2019-03-15 DIAGNOSIS — I872 Venous insufficiency (chronic) (peripheral): Secondary | ICD-10-CM | POA: Insufficient documentation

## 2019-03-15 DIAGNOSIS — E11628 Type 2 diabetes mellitus with other skin complications: Secondary | ICD-10-CM

## 2019-03-15 MED FILL — CYCLOBENZAPRINE 10 MG TAB: 10 | 30 days supply | Qty: 60 | Fill #0

## 2019-03-15 NOTE — Progress Notes (Signed)
Office Visit Note   Patient: Taylor Hughes           Date of Birth: September 03, 1970           MRN: [id number] Visit Date: 03/15/2019              Requested by: No referring provider defined for this encounter. PCP: Patient, No Pcp Per  Chief Complaint  Patient presents with  . Left Foot - Follow-up    08/20/2018 left foot I&D & 5th ray      HPI: The patient is a 48 year old gentleman seen today for evaluation of bilateral lower extremities.  Complaining of a significant increase in his chronic edema over the last 24 hours.  Today is having significant swelling unable to put on his shoes.   Does endorse having a change in his diet in the last day or so states he ate much more starch potatoes as well as some biscuits and quite a bit of fried chicken which is different than usual for him.  No new medications. no changes in medication.  Is unable to don his compression garments at this time.  Assessment & Plan: Visit Diagnoses:  1. Diabetic infection of left foot ([place])   2. Venous insufficiency (chronic) (peripheral)     Plan: We will apply Dynaflex wraps to get the swelling under control.  He should be able to progress to his compression garments however I like for him to follow-up with his primary care for ongoing issues of swelling this seems to be a chronic issue for him.  Follow-Up Instructions: Return in about 1 week (around 03/22/2019).   Ortho Exam  Patient is alert, oriented, no adenopathy, well-dressed, normal affect, normal respiratory effort. On examination of bilateral lower extremities there is 2+ pitting edema bilaterally there is no weeping no erythema.  On exam of the left foot the incision is well-healed from the fifth ray amputation there is no erythema no warmth no new ulcers to this foot.  On his right foot the second toe is darkened and discolored it appears as though he has avulsed his second toenail remotely.  There is no open ulceration no drainage no erythema no  warmth.  Does have palpable dorsalis pedis pulses bilaterally.  Imaging: No results found. No images are attached to the encounter.  Labs: Lab Results  Component Value Date   HGBA1C 11.4 (H) 11/25/2018   HGBA1C 9.6 (H) 07/28/2018   REPTSTATUS 11/27/2018 FINAL 11/22/2018   REPTSTATUS 11/27/2018 FINAL 11/22/2018   GRAMSTAIN  11/22/2018    RARE WBC PRESENT, PREDOMINANTLY PMN MODERATE GRAM POSITIVE COCCI    CULT  11/22/2018    NO GROWTH 5 DAYS Performed at [facility], [place] [address], [place], [place] [address]    CULT  11/22/2018    NO GROWTH 5 DAYS Performed at [facility] [address] [place], [place] [address]    [address] 11/22/2018     Lab Results  Component Value Date   ALBUMIN 2.9 (L) 12/17/2018   ALBUMIN 3.2 (L) 11/22/2018   ALBUMIN 1.9 (L) 08/05/2018    Lab Results  Component Value Date   MG 1.8 12/18/2018   MG 2.1 11/29/2018   MG 1.9 11/27/2018   No results found for: VD25OH  No results found for: PREALBUMIN CBC EXTENDED Latest Ref Rng & Units 01/19/2019 12/18/2018 12/17/2018  WBC 4.0 - 10.5 K/uL 4.2 6.2 -  RBC 4.22 - 5.81 MIL/uL 3.63(L) 3.30(L) -  HGB 13.0 - 17.0 g/dL 10.2(L) 9.3(L) 10.9(L)  HCT 39.0 - 52.0 % 32.1(L) 29.6(L) 32.0(L)  PLT 150 - 400 K/uL 384 424(H) -  NEUTROABS 1.7 - 7.7 K/uL - 4.1 -  LYMPHSABS 0.7 - 4.0 K/uL - 1.3 -     Body mass index is 34.17 kg/m.  Orders:  Orders Placed This Encounter  Procedures  . Ambulatory referral to Internal Medicine   No orders of the defined types were placed in this encounter.    Procedures: No procedures performed  Clinical Data: No additional findings.  ROS:  All other systems negative, except as noted in the HPI. Review of Systems  Constitutional: Negative for chills and fever.  Cardiovascular: Positive for leg swelling.  Skin: Positive for color change. Negative for wound.    Objective: Vital Signs: Ht 5\' 11"  (1.803 m)   Wt 245 lb (111.1 kg)    BMI 34.17 kg/m   Specialty Comments:  No specialty comments available.  PMFS History: Patient Active Problem List   Diagnosis Date Noted  . Venous insufficiency (chronic) (peripheral) 03/15/2019  . Hypoglycemia due to insulin 12/18/2018  . Acute encephalopathy 12/17/2018  . Neck abscess 11/23/2018  . Essential hypertension 11/23/2018  . AKI (acute kidney injury) ([place]) 11/23/2018  . Obesity (BMI 30.0-34.9) 11/23/2018  . Type 2 diabetes mellitus with other specified complication ([place]) 70/96/2836  . Sepsis due to group B Streptococcus ([place])   . Diabetic infection of left foot ([place])   . Left foot infection   . DKA, type 2 ([place]) 07/28/2018  . Leukocytosis 07/28/2018  . Hyperkalemia 07/28/2018  . Hyponatremia 07/28/2018   Past Medical History:  Diagnosis Date  . Atrial fibrillation ([place])   . Diabetes mellitus without complication ([facility])    Type 2  . GERD (gastroesophageal reflux disease)   . Wears glasses   . Wound, open, foot    left diabetic     Family History  Problem Relation Age of Onset  . Diabetes Mother     Past Surgical History:  Procedure Laterality Date  . AMPUTATION Left 08/20/2018   Procedure: LEFT FOOT IRRIGATON AND DEBRIDEMENT, 5TH RAY AMPUTATION;  Surgeon: Newt Minion, MD;  Location: [facility];  Service: Orthopedics;  Laterality: Left;  . FINGER SURGERY Right 2016   I&D  small finger  . I&D EXTREMITY Left 07/30/2018   Procedure: IRRIGATION AND DEBRIDEMENT LEFT FOOT WITH POSSIBLE AMPUTATION OF FIFTH TOE;  Surgeon: Mcarthur Rossetti, MD;  Location: [facility];  Service: Orthopedics;  Laterality: Left;  . INCISION AND DRAINAGE ABSCESS Left 01/19/2019   Procedure: INCISION AND DRAINAGE ABSCESS upper chest;  Surgeon: Melida Quitter, MD;  Location: [facility];  Service: ENT;  Laterality: Left;  . MINOR IRRIGATION AND DEBRIDEMENT OF WOUND N/A 11/22/2018   Procedure: INCISION AND DRAINAGE OF NECK ABSCESS;  Surgeon: Melida Quitter, MD;  Location: [facility];  Service: ENT;   Laterality: N/A;   Social History   Occupational History  . Not on file  Tobacco Use  . Smoking status: Never Smoker  . Smokeless tobacco: Never Used  Substance and Sexual Activity  . Alcohol use: No    Alcohol/week: 0.0 standard drinks  . Drug use: No  . Sexual activity: Not on file

## 2019-03-22 ENCOUNTER — Ambulatory Visit: Payer: Self-pay | Admitting: Family

## 2019-05-03 MED FILL — !LEVEMIR 100 UNITS/ML VIAL: 100/ML | 42 days supply | Qty: 10 | Fill #2

## 2019-05-11 ENCOUNTER — Other Ambulatory Visit: Payer: Self-pay | Admitting: Pharmacist

## 2019-05-11 MED ORDER — TRUEPLUS LANCETS 28G MISC: 11 refills | Status: DC

## 2019-05-11 MED FILL — TRUEplus LANCETS 28G MISC: 25 days supply | Qty: 100 | Fill #0

## 2019-05-17 ENCOUNTER — Other Ambulatory Visit: Payer: Self-pay | Admitting: Family Medicine

## 2019-05-17 DIAGNOSIS — L0211 Cutaneous abscess of neck: Secondary | ICD-10-CM

## 2019-05-23 ENCOUNTER — Inpatient Hospital Stay (HOSPITAL_COMMUNITY)
Admission: EM | Admit: 2019-05-23 | Discharge: 2019-06-07 | DRG: 854 | Disposition: A | Discharge status: Home Health | Payer: Self-pay | Attending: Internal Medicine | Admitting: Internal Medicine

## 2019-05-23 ENCOUNTER — Emergency Department (HOSPITAL_COMMUNITY): Payer: Self-pay

## 2019-05-23 ENCOUNTER — Encounter (HOSPITAL_COMMUNITY): Payer: Self-pay | Admitting: Emergency Medicine

## 2019-05-23 ENCOUNTER — Other Ambulatory Visit: Payer: Self-pay

## 2019-05-23 DIAGNOSIS — L03313 Cellulitis of chest wall: Secondary | ICD-10-CM | POA: Diagnosis present

## 2019-05-23 DIAGNOSIS — M869 Osteomyelitis, unspecified: Secondary | ICD-10-CM | POA: Diagnosis present

## 2019-05-23 DIAGNOSIS — Z7982 Long term (current) use of aspirin: Secondary | ICD-10-CM

## 2019-05-23 DIAGNOSIS — G4733 Obstructive sleep apnea (adult) (pediatric): Secondary | ICD-10-CM | POA: Diagnosis present

## 2019-05-23 DIAGNOSIS — E11649 Type 2 diabetes mellitus with hypoglycemia without coma: Secondary | ICD-10-CM | POA: Diagnosis present

## 2019-05-23 DIAGNOSIS — Z20828 Contact with and (suspected) exposure to other viral communicable diseases: Secondary | ICD-10-CM | POA: Diagnosis present

## 2019-05-23 DIAGNOSIS — I10 Essential (primary) hypertension: Secondary | ICD-10-CM | POA: Diagnosis present

## 2019-05-23 DIAGNOSIS — I110 Hypertensive heart disease with heart failure: Secondary | ICD-10-CM | POA: Diagnosis present

## 2019-05-23 DIAGNOSIS — K219 Gastro-esophageal reflux disease without esophagitis: Secondary | ICD-10-CM | POA: Diagnosis present

## 2019-05-23 DIAGNOSIS — A419 Sepsis, unspecified organism: Principal | ICD-10-CM | POA: Diagnosis present

## 2019-05-23 DIAGNOSIS — E1169 Type 2 diabetes mellitus with other specified complication: Secondary | ICD-10-CM | POA: Diagnosis present

## 2019-05-23 DIAGNOSIS — L02213 Cutaneous abscess of chest wall: Secondary | ICD-10-CM | POA: Diagnosis present

## 2019-05-23 DIAGNOSIS — F419 Anxiety disorder, unspecified: Secondary | ICD-10-CM | POA: Diagnosis present

## 2019-05-23 DIAGNOSIS — D62 Acute posthemorrhagic anemia: Secondary | ICD-10-CM | POA: Diagnosis present

## 2019-05-23 DIAGNOSIS — Z79899 Other long term (current) drug therapy: Secondary | ICD-10-CM

## 2019-05-23 DIAGNOSIS — Z833 Family history of diabetes mellitus: Secondary | ICD-10-CM

## 2019-05-23 DIAGNOSIS — G629 Polyneuropathy, unspecified: Secondary | ICD-10-CM | POA: Diagnosis present

## 2019-05-23 DIAGNOSIS — I5032 Chronic diastolic (congestive) heart failure: Secondary | ICD-10-CM | POA: Diagnosis present

## 2019-05-23 DIAGNOSIS — B9561 Methicillin susceptible Staphylococcus aureus infection as the cause of diseases classified elsewhere: Secondary | ICD-10-CM | POA: Diagnosis present

## 2019-05-23 DIAGNOSIS — M009 Pyogenic arthritis, unspecified: Secondary | ICD-10-CM | POA: Diagnosis present

## 2019-05-23 DIAGNOSIS — E1165 Type 2 diabetes mellitus with hyperglycemia: Secondary | ICD-10-CM | POA: Diagnosis present

## 2019-05-23 DIAGNOSIS — N179 Acute kidney failure, unspecified: Secondary | ICD-10-CM | POA: Diagnosis present

## 2019-05-23 DIAGNOSIS — B955 Unspecified streptococcus as the cause of diseases classified elsewhere: Secondary | ICD-10-CM | POA: Diagnosis present

## 2019-05-23 DIAGNOSIS — Z6836 Body mass index (BMI) 36.0-36.9, adult: Secondary | ICD-10-CM

## 2019-05-23 DIAGNOSIS — Z794 Long term (current) use of insulin: Secondary | ICD-10-CM

## 2019-05-23 DIAGNOSIS — I480 Paroxysmal atrial fibrillation: Secondary | ICD-10-CM | POA: Diagnosis present

## 2019-05-23 DIAGNOSIS — E669 Obesity, unspecified: Secondary | ICD-10-CM | POA: Diagnosis present

## 2019-05-23 DIAGNOSIS — E11628 Type 2 diabetes mellitus with other skin complications: Secondary | ICD-10-CM | POA: Diagnosis present

## 2019-05-23 LAB — CBG MONITORING, ED: Glucose-Capillary: 189 mg/dL — ABNORMAL HIGH (ref 70–99)

## 2019-05-23 LAB — CBC WITH DIFFERENTIAL/PLATELET
Abs Immature Granulocytes: 0.01 10*3/uL (ref 0.00–0.07)
Basophils Absolute: 0 10*3/uL (ref 0.0–0.1)
Basophils Relative: 0 %
Eosinophils Absolute: 0 10*3/uL (ref 0.0–0.5)
Eosinophils Relative: 0 %
HCT: 35.7 % — ABNORMAL LOW (ref 39.0–52.0)
Hemoglobin: 11.7 g/dL — ABNORMAL LOW (ref 13.0–17.0)
Immature Granulocytes: 0 %
Lymphocytes Relative: 11 %
Lymphs Abs: 1 10*3/uL (ref 0.7–4.0)
MCH: 29 pg (ref 26.0–34.0)
MCHC: 32.8 g/dL (ref 30.0–36.0)
MCV: 88.4 fL (ref 80.0–100.0)
Monocytes Absolute: 0.7 10*3/uL (ref 0.1–1.0)
Monocytes Relative: 8 %
Neutro Abs: 6.8 10*3/uL (ref 1.7–7.7)
Neutrophils Relative %: 81 %
Platelets: 350 10*3/uL (ref 150–400)
RBC: 4.04 MIL/uL — ABNORMAL LOW (ref 4.22–5.81)
RDW: 14.5 % (ref 11.5–15.5)
WBC: 8.5 10*3/uL (ref 4.0–10.5)
nRBC: 0 % (ref 0.0–0.2)

## 2019-05-23 LAB — COMPREHENSIVE METABOLIC PANEL
ALT: 12 U/L (ref 0–44)
AST: 17 U/L (ref 15–41)
Albumin: 2.7 g/dL — ABNORMAL LOW (ref 3.5–5.0)
Alkaline Phosphatase: 79 U/L (ref 38–126)
Anion gap: 13 (ref 5–15)
BUN: 16 mg/dL (ref 6–20)
CO2: 23 mmol/L (ref 22–32)
Calcium: 8.7 mg/dL — ABNORMAL LOW (ref 8.9–10.3)
Chloride: 98 mmol/L (ref 98–111)
Creatinine, Ser: 1.02 mg/dL (ref 0.61–1.24)
GFR calc Af Amer: >60 mL/min (ref >60)
GFR calc non Af Amer: >60 mL/min (ref >60)
Glucose, Bld: 128 mg/dL — ABNORMAL HIGH (ref 70–99)
Potassium: 3.6 mmol/L (ref 3.5–5.1)
Sodium: 134 mmol/L — ABNORMAL LOW (ref 135–145)
Total Bilirubin: 0.3 mg/dL (ref 0.3–1.2)
Total Protein: 7.9 g/dL (ref 6.5–8.1)

## 2019-05-23 LAB — LACTIC ACID, PLASMA: Lactic Acid, Venous: 2.2 mmol/L (ref 0.5–1.9)

## 2019-05-23 MED ORDER — SODIUM CHLORIDE 0.9 % IV BOLUS
500.0000 mL | Freq: Once | INTRAVENOUS | Status: AC
  Administered 2019-05-23: 23:00:00 500 mL via INTRAVENOUS

## 2019-05-23 MED ORDER — IOHEXOL 300 MG/ML  SOLN
75.0000 mL | Freq: Once | INTRAMUSCULAR | Status: AC | PRN
  Administered 2019-05-23: 23:00:00 75 mL via INTRAVENOUS

## 2019-05-23 MED ORDER — CLINDAMYCIN PHOSPHATE 600 MG/50ML IV SOLN
600.0000 mg | Freq: Once | INTRAVENOUS | Status: AC
  Administered 2019-05-23: 600 mg via INTRAVENOUS
  Filled 2019-05-23: qty 50

## 2019-05-23 MED ORDER — SODIUM CHLORIDE (PF) 0.9 % IJ SOLN
INTRAMUSCULAR | Status: AC
  Filled 2019-05-23: qty 50

## 2019-05-23 MED ORDER — SODIUM CHLORIDE 0.9% FLUSH
3.0000 mL | Freq: Once | INTRAVENOUS | Status: AC
  Administered 2019-05-23: 23:00:00 3 mL via INTRAVENOUS

## 2019-05-23 NOTE — ED Triage Notes (Signed)
Patient is from home with complaints of fever and fatigue for 2 days. He also complains of pain around his chest wound that radiates into his arm. He has a history of multiple surgeries, infection to the open chest wound, Diabetes and A FIB. He had surgery a couple of months ago.  EMS administered 1000 mg of Tylenol for fever.   120 HR 134/76 BP 33 ETCO2  36 RR 324 CBG 100% O2 sat on room air 101.7 (pre Tylenol) 100.5 (post Tylenol)

## 2019-05-23 NOTE — ED Notes (Signed)
Pt provided urinal at bedside to use for U/A collection per MD order. Advised pt to use call bell for assistance as needed. [employer]

## 2019-05-23 NOTE — ED Provider Notes (Signed)
[facility] DEPT Provider Note   CSN: [id number] Arrival date & time: 05/23/19  1950     History   Chief Complaint Chief Complaint  Patient presents with   Fever   Fatigue    HPI Taylor Hughes is a 48 y.o. male with past medical history of hypertension, type 2 diabetes, atrial fibrillation, presenting to the emergency department with complaint of pain and swelling to his chest wall.  He had an incision and drainage done by Dr. Redmond Baseman in June of this year for abscess in this area.  He states it had completely healed though he noticed overnight it had drained a fluid that left a white crusting on his chest.  He states he  noticed it feels more firm and tender and is concerned it's becoming infected again.  Today he felt subjective fever and chills.  He states this is not the first time he had issue with this wound prior to the incision and drainage- it had been drained and healed up completely on another occasion.  He does endorse a loose stool today, however denies any new cough, congestion, abdominal pain, nausea or vomiting or other infectious symptoms. His blood sugars have been running high, 500 a few days ago, in the 200s today.     The history is provided by the patient and medical records.    Past Medical History:  Diagnosis Date   Atrial fibrillation ([place])    Diabetes mellitus without complication ([place])    Type 2   GERD (gastroesophageal reflux disease)    Wears glasses    Wound, open, foot    left diabetic     Patient Active Problem List   Diagnosis Date Noted   Venous insufficiency (chronic) (peripheral) 03/15/2019   Edema of both lower extremities due to peripheral venous insufficiency 03/15/2019   Hypoglycemia due to insulin 12/18/2018   Acute encephalopathy 12/17/2018   Neck abscess 11/23/2018   Essential hypertension 11/23/2018   AKI (acute kidney injury) ([place]) 11/23/2018   Obesity (BMI 30.0-34.9) 11/23/2018    Type 2 diabetes mellitus with other specified complication ([place]) 64/40/3474   Sepsis due to group B Streptococcus ([place])    Diabetic infection of left foot ([place])    Left foot infection    DKA, type 2 ([place]) 07/28/2018   Leukocytosis 07/28/2018   Hyperkalemia 07/28/2018   Hyponatremia 07/28/2018    Past Surgical History:  Procedure Laterality Date   AMPUTATION Left 08/20/2018   Procedure: LEFT FOOT IRRIGATON AND DEBRIDEMENT, 5TH RAY AMPUTATION;  Surgeon: Newt Minion, MD;  Location: [facility];  Service: Orthopedics;  Laterality: Left;   FINGER SURGERY Right 2016   I&D  small finger   I&D EXTREMITY Left 07/30/2018   Procedure: IRRIGATION AND DEBRIDEMENT LEFT FOOT WITH POSSIBLE AMPUTATION OF FIFTH TOE;  Surgeon: Mcarthur Rossetti, MD;  Location: [facility];  Service: Orthopedics;  Laterality: Left;   INCISION AND DRAINAGE ABSCESS Left 01/19/2019   Procedure: INCISION AND DRAINAGE ABSCESS upper chest;  Surgeon: Melida Quitter, MD;  Location: [facility];  Service: ENT;  Laterality: Left;   MINOR IRRIGATION AND DEBRIDEMENT OF WOUND N/A 11/22/2018   Procedure: INCISION AND DRAINAGE OF NECK ABSCESS;  Surgeon: Melida Quitter, MD;  Location: [facility];  Service: ENT;  Laterality: N/A;        Home Medications    Prior to Admission medications   Medication Sig Start Date End Date Taking? Authorizing Provider  ibuprofen (ADVIL) 200 MG tablet Take 400  mg by mouth every 6 (six) hours as needed for moderate pain.   Yes [provider]  insulin detemir (LEVEMIR) 100 UNIT/ML injection Inject 0.2 mLs (20 Units total) into the skin daily. 12/06/18  Yes Newlin, Charlane Ferretti, MD  insulin lispro (HUMALOG) 100 UNIT/ML injection Inject 2-12 Units into the skin See admin instructions. Inject 12 units daily with lunch & may take an additional 2-3 units if needed for blood sugars greater than 395.   Yes [provider]  metoprolol tartrate (LOPRESSOR) 50 MG tablet Take 1 tablet (50 mg total) by mouth  2 (two) times daily for 30 days. 12/06/18 01/17/20 Yes Charlott Rakes, MD  aspirin EC 81 MG tablet Take 1 tablet (81 mg total) by mouth daily. Patient not taking: Reported on 05/23/2019 11/29/18 11/29/19  [place] Eric J, DO  blood glucose meter kit and supplies KIT Dispense based on patient and insurance preference. Use up to four times daily as directed. (FOR ICD-9 250.00, 250.01). 11/29/18   [place] Eric J, DO  cyclobenzaprine (FLEXERIL) 10 MG tablet TAKE 1 TABLET (10 MG TOTAL) BY MOUTH 2 (TWO) TIMES DAILY AS NEEDED FOR MUSCLE SPASMS. Patient not taking: Reported on 05/23/2019 03/14/19   Charlott Rakes, MD  glucose blood test strip Needs supply of TRUEMETRIC STRIPS Use as instructed 12/22/18   Charlott Rakes, MD  HYDROcodone-acetaminophen (NORCO/VICODIN) 5-325 MG tablet Take 1-2 tablets by mouth every 6 (six) hours as needed for moderate pain. Patient not taking: Reported on 05/23/2019 01/19/19 01/19/20  Melida Quitter, MD  Insulin Pen Needle 32G X 5 MM MISC Use with levemir pen as prescribed 11/29/18   [place] Donnamarie Poag, DO  TRUEplus Lancets 28G MISC Use to check blood sugar daily. 05/11/19   Charlott Rakes, MD    Family History Family History  Problem Relation Age of Onset   Diabetes Mother     Social History Social History   Tobacco Use   Smoking status: Never Smoker   Smokeless tobacco: Never Used  Substance Use Topics   Alcohol use: No    Alcohol/week: 0.0 standard drinks   Drug use: No     Allergies   Patient has no known allergies.   Review of Systems Review of Systems  Constitutional: Positive for chills and fever.  Respiratory: Negative for cough and shortness of breath.   Gastrointestinal: Positive for diarrhea. Negative for abdominal pain, nausea and vomiting.  Genitourinary: Negative for dysuria.  All other systems reviewed and are negative.    Physical Exam Updated Vital Signs BP 110/75    Pulse (!) 102    Temp (!) 100.5 F (38.1 C) (Oral)    Resp (!) 25     Ht 5' 11" (1.803 m)    Wt 117.9 kg    SpO2 93%    BMI 36.26 kg/m   Physical Exam Vitals signs and nursing note reviewed.  Constitutional:      Appearance: He is well-developed.  HENT:     Head: Normocephalic and atraumatic.  Eyes:     Conjunctiva/sclera: Conjunctivae normal.  Cardiovascular:     Rate and Rhythm: Regular rhythm. Tachycardia present.  Pulmonary:     Effort: Pulmonary effort is normal. No respiratory distress.     Breath sounds: Normal breath sounds.  Abdominal:     General: Bowel sounds are normal.     Palpations: Abdomen is soft.     Tenderness: There is no abdominal tenderness. There is no guarding or rebound.  Skin:    General: Skin  is warm.     Comments: Left anterior superior chest wall with scar that appears to have some dried yellow drainage present. Mildly tender. There is surrounding hyperpigmentation of the skin, no apparent increased warmth. There is an area of induration lateral to this that is tender.  Neurological:     Mental Status: He is alert.  Psychiatric:        Behavior: Behavior normal.      ED Treatments / Results  Labs (all labs ordered are listed, but only abnormal results are displayed) Labs Reviewed  LACTIC ACID, PLASMA - Abnormal; Notable for the following components:      Result Value   Lactic Acid, Venous 2.2 (*)    All other components within normal limits  COMPREHENSIVE METABOLIC PANEL - Abnormal; Notable for the following components:   Sodium 134 (*)    Glucose, Bld 128 (*)    Calcium 8.7 (*)    Albumin 2.7 (*)    All other components within normal limits  CBC WITH DIFFERENTIAL/PLATELET - Abnormal; Notable for the following components:   RBC 4.04 (*)    Hemoglobin 11.7 (*)    HCT 35.7 (*)    All other components within normal limits  CBG MONITORING, ED - Abnormal; Notable for the following components:   Glucose-Capillary 189 (*)    All other components within normal limits  CULTURE, BLOOD (ROUTINE X 2)  CULTURE,  BLOOD (ROUTINE X 2)  LACTIC ACID, PLASMA  URINALYSIS, ROUTINE W REFLEX MICROSCOPIC    EKG None  Radiology Ct Chest W Contrast  Result Date: 05/23/2019 CLINICAL DATA:  48 year old male with concern for chest wall infection. EXAM: CT CHEST WITH CONTRAST TECHNIQUE: Multidetector CT imaging of the chest was performed during intravenous contrast administration. CONTRAST:  71m OMNIPAQUE IOHEXOL 300 MG/ML  SOLN COMPARISON:  Chest CT dated 12/17/2018 FINDINGS: Cardiovascular: There is no cardiomegaly or pericardial effusion. Coronary vascular calcifications noted. The thoracic aorta appears unremarkable for the degree opacification. Evaluation of the pulmonary arteries is very limited due to suboptimal opacification and timing contrast. Mediastinum/Nodes: No hilar or mediastinal adenopathy. Esophagus and thyroid gland are grossly unremarkable. No mediastinal fluid collection. Lungs/Pleura: The lungs are clear. There is no pleural effusion pneumothorax. The central airways are patent. Upper Abdomen: No acute abnormality. Musculoskeletal: There is a 5.6 x 2.8 cm ill-defined low attenuating collection in the left pectoralis major which may represent an old hematoma or infected fluid. This appears to extend more superiorly and medially to the left sternoclavicular joint. There is irregularity and fragmentation of the left sternoclavicular joint which may represent an infectious process. Ill-defined low attenuating tubular area in the midline upper chest and suprasternal notch (series 2, image 16) extends to the skin surface and is concerning for fluid. Ultrasound may provide better evaluation of the fluid collection in the left pectoral and anterior chest wall. Old right posterior rib fractures and multilevel degenerative changes of the spine. No acute osseous pathology. IMPRESSION: 1. Ill-defined low attenuating collection in the left pectoralis major which may represent an old hematoma or infected fluid. This  collection appears to extend more superiorly and medially to the left sternoclavicular joint. There is irregularity and fragmentation of the left sternoclavicular joint which may represent an infectious process. Ill-defined low attenuating area in the soft tissues of the midline upper chest and suprasternal notch concerning for fluid. Ultrasound may provide better evaluation of the fluid collection in the left pectoral and anterior chest wall. 2. No acute intrathoracic pathology. Electronically  Signed   By: Anner Crete M.D.   On: 05/23/2019 23:14    Procedures Procedures (including critical care time)  Medications Ordered in ED Medications  sodium chloride (PF) 0.9 % injection (has no administration in time range)  sodium chloride flush (NS) 0.9 % injection 3 mL (3 mLs Intravenous Given 05/23/19 2321)  clindamycin (CLEOCIN) IVPB 600 mg (600 mg Intravenous New Bag/Given 05/23/19 2326)  iohexol (OMNIPAQUE) 300 MG/ML solution 75 mL (75 mLs Intravenous Contrast Given 05/23/19 2244)  sodium chloride 0.9 % bolus 500 mL (500 mLs Intravenous New Bag/Given 05/23/19 2327)     Initial Impression / Assessment and Plan / ED Course  I have reviewed the triage vital signs and the nursing notes.  Pertinent labs & imaging results that were available during my care of the patient were reviewed by me and considered in my medical decision making (see chart for details).  Clinical Course as of May 24 11  Mon May 23, 2019  2342 Discussed with Dr. Blenda Nicely with ENT, recommends this is likely a new infection, not assoc with the prior nec fasc of the neck. Will consult gen surgery for further management   [JR]  Tue May 24, 2019  0012 Patient discussed with Dr. Harlow Asa with general surgery.  Agrees with medical admission.  Will evaluate patient in the morning for further management.   [JR]    Clinical Course User Index [JR] Chaysen Tillman, [place] N, PA-C       Patient presenting with left chest wall pain  and induration, as well as fever and tachycardia.  He is type II diabetic though with reassuring CBG today.  He has history of multiple I&D procedures by Dr. 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  Exam, he has pain in duration to the left upper chest wall which appears more lateral and inferior than the prior incisions though there is some drainage present at the most inferior incision.  Labs obtained which reveal a lactic acidosis of 2.2, however normal white count.  IV antibiotics initiated, fluids, and CT scan with contrast to evaluate for deep space infection.  CT imaging revealing fluid collection left pectoralis major muscle concerning for infection given patient's presentation.  Consulted with ENT, Dr. Blenda Nicely, who reviewed images and feels this is a longer in the neck and is likely a new infection not related to previous I&D procedures by Dr. Redmond Baseman.  General surgery consulted, Dr. Harlow Asa, agrees with medical admission and will evaluate patient in the morning for further management.  Patient agreeable to plan for admission. Dr. Hal Hope accepting.  .The patient appears reasonably stabilized for admission considering the current resources, flow, and capabilities available in the ED at this time, and I doubt any other [facility] requiring further screening and/or treatment in the ED prior to admission.   Final Clinical Impressions(s) / ED Diagnoses   Final diagnoses:  Cellulitis of chest wall    ED Discharge Orders    None       Ashden Sonnenberg, [place] N, PA-C 05/24/19 Greer Pickerel, MD 05/24/19 1550

## 2019-05-23 NOTE — Progress Notes (Signed)
CRITICAL VALUE ALERT  Critical Value: Lactic Acid 2.2  Date & Time Notied: 05/23/19   2242  Provider Notified: [place] PA  Orders Received/Actions taken: none at this time

## 2019-05-24 ENCOUNTER — Encounter (HOSPITAL_COMMUNITY): Payer: Self-pay | Admitting: Internal Medicine

## 2019-05-24 DIAGNOSIS — E1169 Type 2 diabetes mellitus with other specified complication: Secondary | ICD-10-CM

## 2019-05-24 DIAGNOSIS — L03313 Cellulitis of chest wall: Secondary | ICD-10-CM | POA: Insufficient documentation

## 2019-05-24 DIAGNOSIS — A419 Sepsis, unspecified organism: Principal | ICD-10-CM | POA: Diagnosis present

## 2019-05-24 DIAGNOSIS — Z794 Long term (current) use of insulin: Secondary | ICD-10-CM

## 2019-05-24 LAB — BASIC METABOLIC PANEL
Anion gap: 12 (ref 5–15)
BUN: 12 mg/dL (ref 6–20)
CO2: 20 mmol/L — ABNORMAL LOW (ref 22–32)
Calcium: 7.9 mg/dL — ABNORMAL LOW (ref 8.9–10.3)
Chloride: 101 mmol/L (ref 98–111)
Creatinine, Ser: 0.84 mg/dL (ref 0.61–1.24)
GFR calc Af Amer: >60 mL/min (ref >60)
GFR calc non Af Amer: >60 mL/min (ref >60)
Glucose, Bld: 187 mg/dL — ABNORMAL HIGH (ref 70–99)
Potassium: 3.4 mmol/L — ABNORMAL LOW (ref 3.5–5.1)
Sodium: 133 mmol/L — ABNORMAL LOW (ref 135–145)

## 2019-05-24 LAB — URINALYSIS, ROUTINE W REFLEX MICROSCOPIC
Bacteria, UA: NONE SEEN
Bilirubin Urine: NEGATIVE
Glucose, UA: 150 mg/dL — AB
Ketones, ur: 20 mg/dL — AB
Leukocytes,Ua: NEGATIVE
Nitrite: NEGATIVE
Protein, ur: 30 mg/dL — AB
Specific Gravity, Urine: >1.046 — ABNORMAL HIGH (ref 1.005–1.030)
pH: 5 (ref 5.0–8.0)

## 2019-05-24 LAB — LACTIC ACID, PLASMA: Lactic Acid, Venous: 1.1 mmol/L (ref 0.5–1.9)

## 2019-05-24 LAB — CBC
HCT: 31.1 % — ABNORMAL LOW (ref 39.0–52.0)
Hemoglobin: 10.2 g/dL — ABNORMAL LOW (ref 13.0–17.0)
MCH: 29.6 pg (ref 26.0–34.0)
MCHC: 32.8 g/dL (ref 30.0–36.0)
MCV: 90.1 fL (ref 80.0–100.0)
Platelets: 296 10*3/uL (ref 150–400)
RBC: 3.45 MIL/uL — ABNORMAL LOW (ref 4.22–5.81)
RDW: 14.8 % (ref 11.5–15.5)
WBC: 9.8 10*3/uL (ref 4.0–10.5)
nRBC: 0 % (ref 0.0–0.2)

## 2019-05-24 LAB — GLUCOSE, CAPILLARY
Glucose-Capillary: 216 mg/dL — ABNORMAL HIGH (ref 70–99)
Glucose-Capillary: 322 mg/dL — ABNORMAL HIGH (ref 70–99)
Glucose-Capillary: 330 mg/dL — ABNORMAL HIGH (ref 70–99)

## 2019-05-24 LAB — CBG MONITORING, ED
Glucose-Capillary: 181 mg/dL — ABNORMAL HIGH (ref 70–99)
Glucose-Capillary: 183 mg/dL — ABNORMAL HIGH (ref 70–99)

## 2019-05-24 LAB — SARS CORONAVIRUS 2 BY RT PCR (HOSPITAL ORDER, PERFORMED IN ~~LOC~~ HOSPITAL LAB): SARS Coronavirus 2: NEGATIVE

## 2019-05-24 MED ORDER — VANCOMYCIN HCL 10 G IV SOLR
1250.0000 mg | Freq: Two times a day (BID) | INTRAVENOUS | Status: DC
  Administered 2019-05-24 – 2019-05-27 (×8): 1250 mg via INTRAVENOUS
  Filled 2019-05-24 (×12): qty 1250

## 2019-05-24 MED ORDER — ACETAMINOPHEN 650 MG RE SUPP: 650.0000 mg | Freq: Four times a day (QID) | RECTAL | Status: DC | PRN

## 2019-05-24 MED ORDER — INSULIN ASPART 100 UNIT/ML ~~LOC~~ SOLN
0.0000 [IU] | SUBCUTANEOUS | Status: DC
  Administered 2019-05-24: 05:00:00 2 [IU] via SUBCUTANEOUS
  Administered 2019-05-24 (×2): 7 [IU] via SUBCUTANEOUS
  Administered 2019-05-24: 09:00:00 2 [IU] via SUBCUTANEOUS
  Administered 2019-05-24: 3 [IU] via SUBCUTANEOUS
  Administered 2019-05-25: 7 [IU] via SUBCUTANEOUS
  Administered 2019-05-25: 9 [IU] via SUBCUTANEOUS
  Administered 2019-05-25: 3 [IU] via SUBCUTANEOUS
  Administered 2019-05-25 (×2): 2 [IU] via SUBCUTANEOUS
  Administered 2019-05-25 – 2019-05-26 (×3): 5 [IU] via SUBCUTANEOUS
  Administered 2019-05-26 (×2): 3 [IU] via SUBCUTANEOUS
  Administered 2019-05-26: 17:00:00 7 [IU] via SUBCUTANEOUS
  Administered 2019-05-26: 5 [IU] via SUBCUTANEOUS
  Administered 2019-05-27: 2 [IU] via SUBCUTANEOUS
  Administered 2019-05-27 (×2): 3 [IU] via SUBCUTANEOUS
  Filled 2019-05-24: qty 0.09

## 2019-05-24 MED ORDER — LACTATED RINGERS IV SOLN
INTRAVENOUS | Status: DC
  Administered 2019-05-24: 03:00:00 via INTRAVENOUS

## 2019-05-24 MED ORDER — HYDROCODONE-ACETAMINOPHEN 5-325 MG PO TABS
1.0000 | ORAL_TABLET | ORAL | Status: DC | PRN
  Administered 2019-05-24: 2 via ORAL
  Administered 2019-05-25: 1 via ORAL
  Filled 2019-05-24: qty 1
  Filled 2019-05-24: qty 2

## 2019-05-24 MED ORDER — ONDANSETRON HCL 4 MG PO TABS
4.0000 mg | ORAL_TABLET | Freq: Four times a day (QID) | ORAL | Status: DC | PRN
  Administered 2019-05-28: 4 mg via ORAL
  Filled 2019-05-24 (×2): qty 1

## 2019-05-24 MED ORDER — LACTATED RINGERS IV BOLUS
2000.0000 mL | Freq: Once | INTRAVENOUS | Status: AC
  Administered 2019-05-24: 2000 mL via INTRAVENOUS

## 2019-05-24 MED ORDER — ONDANSETRON HCL 4 MG/2ML IJ SOLN
4.0000 mg | Freq: Four times a day (QID) | INTRAMUSCULAR | Status: DC | PRN
  Administered 2019-05-28 – 2019-05-29 (×2): 4 mg via INTRAVENOUS
  Filled 2019-05-24 (×2): qty 2

## 2019-05-24 MED ORDER — METOPROLOL TARTRATE 50 MG PO TABS
50.0000 mg | ORAL_TABLET | Freq: Two times a day (BID) | ORAL | Status: DC
  Administered 2019-05-24 – 2019-06-07 (×30): 50 mg via ORAL
  Filled 2019-05-24 (×18): qty 1
  Filled 2019-05-24: qty 2
  Filled 2019-05-24 (×6): qty 1
  Filled 2019-05-24: qty 2
  Filled 2019-05-24 (×5): qty 1

## 2019-05-24 MED ORDER — ACETAMINOPHEN 325 MG PO TABS
650.0000 mg | ORAL_TABLET | Freq: Four times a day (QID) | ORAL | Status: DC | PRN
  Administered 2019-05-24 – 2019-06-03 (×2): 650 mg via ORAL
  Filled 2019-05-24 (×3): qty 2

## 2019-05-24 MED ORDER — PIPERACILLIN-TAZOBACTAM 3.375 G IVPB
3.3750 g | Freq: Three times a day (TID) | INTRAVENOUS | Status: DC
  Administered 2019-05-24 – 2019-05-28 (×13): 3.375 g via INTRAVENOUS
  Filled 2019-05-24 (×12): qty 50

## 2019-05-24 MED ORDER — PIPERACILLIN-TAZOBACTAM 3.375 G IVPB
3.3750 g | Freq: Once | INTRAVENOUS | Status: AC
  Administered 2019-05-24: 01:00:00 3.375 g via INTRAVENOUS
  Filled 2019-05-24: qty 50

## 2019-05-24 MED ORDER — VANCOMYCIN HCL 10 G IV SOLR
2000.0000 mg | Freq: Once | INTRAVENOUS | Status: AC
  Administered 2019-05-24: 01:00:00 2000 mg via INTRAVENOUS
  Filled 2019-05-24: qty 2000

## 2019-05-24 MED ORDER — INSULIN DETEMIR 100 UNIT/ML ~~LOC~~ SOLN
10.0000 [IU] | Freq: Every day | SUBCUTANEOUS | Status: DC
  Administered 2019-05-24 – 2019-05-26 (×3): 10 [IU] via SUBCUTANEOUS
  Filled 2019-05-24 (×4): qty 0.1

## 2019-05-24 NOTE — ED Notes (Signed)
Patient placed on hospital bed for comfort due to holding in the ED

## 2019-05-24 NOTE — Plan of Care (Signed)

## 2019-05-24 NOTE — H&P (Signed)
History and Physical    Taylor Hughes ZOX:[id number] DOB: [birth year] DOA: 05/23/2019  PCP: Patient, No Pcp Per  Patient coming from: Home.  Chief Complaint: Fever chills left anterior chest wall pain.  HPI: Taylor Hughes is a 48 y.o. male with history of diabetes mellitus type 2 who was admitted in April 2020 for necrotizing fasciitis involving his neck and anterior chest wall muscles underwent debridement during which patient also was in DKA and briefly in A. fib with RVR presents to the ER with complaints of having fever chills over the last 2 days.  Has been having increasing pain in the left anterior chest wall.  Has pain on moving his left arm.  Denies any trauma or insect bite.  Has chronic scar from his previous surgery at around his sternal notch which patient states he noticed some discharge.  ED Course: In the ER patient was febrile tachycardic with temperature 100.5 F.  Labs show lactic acid of 2.2 which improved with fluids to 1.1 WBC count 8.5 hemoglobin low 1.7 platelets 350 creatinine 1 albumin 2.7 COVID-19 test was negative.  CT chest shows fluid collection around the left pectoral muscle.  On-call general surgeon Dr. Harlow Asa was consulted and patient started on empiric antibiotics admitted for further management for sepsis secondary to possible cellulitis.  Review of Systems: As per HPI, rest all negative.   Past Medical History:  Diagnosis Date  . Atrial fibrillation ([place])   . Diabetes mellitus without complication (HCC)    Type 2  . GERD (gastroesophageal reflux disease)   . Wears glasses   . Wound, open, foot    left diabetic     Past Surgical History:  Procedure Laterality Date  . AMPUTATION Left 08/20/2018   Procedure: LEFT FOOT IRRIGATON AND DEBRIDEMENT, 5TH RAY AMPUTATION;  Surgeon: Newt Minion, MD;  Location: [facility] [facility];  Service: Orthopedics;  Laterality: Left;  . FINGER SURGERY Right 2016   I&D  small finger  . I&D EXTREMITY Left 07/30/2018   Procedure:  IRRIGATION AND DEBRIDEMENT LEFT FOOT WITH POSSIBLE AMPUTATION OF FIFTH TOE;  Surgeon: Mcarthur Rossetti, MD;  Location: [facility];  Service: Orthopedics;  Laterality: Left;  . INCISION AND DRAINAGE ABSCESS Left 01/19/2019   Procedure: INCISION AND DRAINAGE ABSCESS upper chest;  Surgeon: Melida Quitter, MD;  Location: [facility] [facility];  Service: ENT;  Laterality: Left;  . MINOR IRRIGATION AND DEBRIDEMENT OF WOUND N/A 11/22/2018   Procedure: INCISION AND DRAINAGE OF NECK ABSCESS;  Surgeon: Melida Quitter, MD;  Location: [facility];  Service: ENT;  Laterality: N/A;     reports that he has never smoked. He has never used smokeless tobacco. He reports that he does not drink alcohol or use drugs.  No Known Allergies  Family History  Problem Relation Age of Onset  . Diabetes Mother     Prior to Admission medications   Medication Sig Start Date End Date Taking? Authorizing Provider  ibuprofen (ADVIL) 200 MG tablet Take 400 mg by mouth every 6 (six) hours as needed for moderate pain.   Yes [provider]  insulin detemir (LEVEMIR) 100 UNIT/ML injection Inject 0.2 mLs (20 Units total) into the skin daily. 12/06/18  Yes Newlin, Charlane Ferretti, MD  insulin lispro (HUMALOG) 100 UNIT/ML injection Inject 2-12 Units into the skin See admin instructions. Inject 12 units daily with lunch & may take an additional 2-3 units if needed for blood sugars greater than 395.   Yes [provider]  metoprolol tartrate (LOPRESSOR) 50  MG tablet Take 1 tablet (50 mg total) by mouth 2 (two) times daily for 30 days. 12/06/18 01/17/20 Yes Charlott Rakes, MD  aspirin EC 81 MG tablet Take 1 tablet (81 mg total) by mouth daily. Patient not taking: Reported on 05/23/2019 11/29/18 11/29/19  [place] Eric J, DO  blood glucose meter kit and supplies KIT Dispense based on patient and insurance preference. Use up to four times daily as directed. (FOR ICD-9 250.00, 250.01). 11/29/18   [place] Eric J, DO  cyclobenzaprine (FLEXERIL) 10 MG tablet  TAKE 1 TABLET (10 MG TOTAL) BY MOUTH 2 (TWO) TIMES DAILY AS NEEDED FOR MUSCLE SPASMS. Patient not taking: Reported on 05/23/2019 03/14/19   Charlott Rakes, MD  glucose blood test strip Needs supply of TRUEMETRIC STRIPS Use as instructed 12/22/18   Charlott Rakes, MD  HYDROcodone-acetaminophen (NORCO/VICODIN) 5-325 MG tablet Take 1-2 tablets by mouth every 6 (six) hours as needed for moderate pain. Patient not taking: Reported on 05/23/2019 01/19/19 01/19/20  Melida Quitter, MD  Insulin Pen Needle 32G X 5 MM MISC Use with levemir pen as prescribed 11/29/18   [place] Donnamarie Poag, DO  TRUEplus Lancets 28G MISC Use to check blood sugar daily. 05/11/19   Charlott Rakes, MD    Physical Exam: Constitutional: Moderately built and nourished. Vitals:   05/24/19 0000 05/24/19 0031 05/24/19 0033 05/24/19 0100  BP: 110/75 117/88 117/88 125/82  Pulse: (!) 102 (!) 105 (!) 106 (!) 108  Resp: (!) 25 (!) 22 (!) 24 (!) 21  Temp:      TempSrc:      SpO2: 93% 98% 99% 97%  Weight:      Height:       Eyes: Anicteric no pallor. ENMT: No discharge from the ears or nose or mouth. Neck: No mass felt.  No neck rigidity. Respiratory: No rhonchi or crepitations. Cardiovascular: S1-S2 heard. Abdomen: Soft nontender bowel sounds present. Musculoskeletal: No edema.  Tenderness in the left anterior chest wall.  Pain on moving left arm. Skin: Scar seen on the anterior chest wall from previous surgery with no discharge. Neurologic: Alert awake oriented to time place and person.  Moves all extremities. Psychiatric: Appears normal.   Labs on Admission: I have personally reviewed following labs and imaging studies  CBC: Recent Labs  Lab 05/23/19 2133  WBC 8.5  NEUTROABS 6.8  HGB 11.7*  HCT 35.7*  MCV 88.4  PLT 128   Basic Metabolic Panel: Recent Labs  Lab 05/23/19 2133  NA 134*  K 3.6  CL 98  CO2 23  GLUCOSE 128*  BUN 16  CREATININE 1.02  CALCIUM 8.7*   GFR: Estimated Creatinine Clearance: 116.9 mL/min  (by C-G formula based on SCr of 1.02 mg/dL). Liver Function Tests: Recent Labs  Lab 05/23/19 2133  AST 17  ALT 12  ALKPHOS 79  BILITOT 0.3  PROT 7.9  ALBUMIN 2.7*   No results for input(s): LIPASE, AMYLASE in the last 168 hours. No results for input(s): AMMONIA in the last 168 hours. Coagulation Profile: No results for input(s): INR, PROTIME in the last 168 hours. Cardiac Enzymes: No results for input(s): CKTOTAL, CKMB, CKMBINDEX, TROPONINI in the last 168 hours. BNP (last 3 results) No results for input(s): PROBNP in the last 8760 hours. HbA1C: No results for input(s): HGBA1C in the last 72 hours. CBG: Recent Labs  Lab 05/23/19 2027  GLUCAP 189*   Lipid Profile: No results for input(s): CHOL, HDL, LDLCALC, TRIG, CHOLHDL, LDLDIRECT in the last 72 hours. Thyroid Function  Tests: No results for input(s): TSH, T4TOTAL, FREET4, T3FREE, THYROIDAB in the last 72 hours. Anemia Panel: No results for input(s): VITAMINB12, FOLATE, FERRITIN, TIBC, IRON, RETICCTPCT in the last 72 hours. Urine analysis:    Component Value Date/Time   COLORURINE YELLOW 12/18/2018 0047   APPEARANCEUR CLEAR 12/18/2018 0047   LABSPEC 1.027 12/18/2018 0047   PHURINE 6.0 12/18/2018 0047   GLUCOSEU NEGATIVE 12/18/2018 0047   HGBUR NEGATIVE 12/18/2018 0047   BILIRUBINUR NEGATIVE 12/18/2018 0047   KETONESUR NEGATIVE 12/18/2018 0047   PROTEINUR NEGATIVE 12/18/2018 0047   NITRITE NEGATIVE 12/18/2018 0047   LEUKOCYTESUR NEGATIVE 12/18/2018 0047   Sepsis Labs: _0 (procalcitonin:4,lacticidven:4) )No results found for this or any previous visit (from the past 240 hour(s)).   Radiological Exams on Admission: Ct Chest W Contrast  Result Date: 05/23/2019 CLINICAL DATA:  48 year old male with concern for chest wall infection. EXAM: CT CHEST WITH CONTRAST TECHNIQUE: Multidetector CT imaging of the chest was performed during intravenous contrast administration. CONTRAST:  69m OMNIPAQUE IOHEXOL 300 MG/ML   SOLN COMPARISON:  Chest CT dated 12/17/2018 FINDINGS: Cardiovascular: There is no cardiomegaly or pericardial effusion. Coronary vascular calcifications noted. The thoracic aorta appears unremarkable for the degree opacification. Evaluation of the pulmonary arteries is very limited due to suboptimal opacification and timing contrast. Mediastinum/Nodes: No hilar or mediastinal adenopathy. Esophagus and thyroid gland are grossly unremarkable. No mediastinal fluid collection. Lungs/Pleura: The lungs are clear. There is no pleural effusion pneumothorax. The central airways are patent. Upper Abdomen: No acute abnormality. Musculoskeletal: There is a 5.6 x 2.8 cm ill-defined low attenuating collection in the left pectoralis major which may represent an old hematoma or infected fluid. This appears to extend more superiorly and medially to the left sternoclavicular joint. There is irregularity and fragmentation of the left sternoclavicular joint which may represent an infectious process. Ill-defined low attenuating tubular area in the midline upper chest and suprasternal notch (series 2, image 16) extends to the skin surface and is concerning for fluid. Ultrasound may provide better evaluation of the fluid collection in the left pectoral and anterior chest wall. Old right posterior rib fractures and multilevel degenerative changes of the spine. No acute osseous pathology. IMPRESSION: 1. Ill-defined low attenuating collection in the left pectoralis major which may represent an old hematoma or infected fluid. This collection appears to extend more superiorly and medially to the left sternoclavicular joint. There is irregularity and fragmentation of the left sternoclavicular joint which may represent an infectious process. Ill-defined low attenuating area in the soft tissues of the midline upper chest and suprasternal notch concerning for fluid. Ultrasound may provide better evaluation of the fluid collection in the left  pectoral and anterior chest wall. 2. No acute intrathoracic pathology. Electronically Signed   By: AAnner CreteM.D.   On: 05/23/2019 23:14     Assessment/Plan Active Problems:   Essential hypertension   Type 2 diabetes mellitus with other specified complication ([place]   Sepsis ([place]    1. Sepsis likely from cellulitis of the left anterior chest wall -we will keep patient on empiric antibiotics follow cultures continue hydration General surgery has been consulted.  We will keep patient n.p.o. for now in anticipation of possible surgery. 2. Diabetes mellitus type 2 we will keep patient on sliding scale coverage.  Closely follow metabolic panel to make sure patient is not in DKA. 3. Anemia will follow CBC.  Anemia appears to be chronic and will need further work-up as outpatient. 4. History of paroxysmal atrial fibrillation in  the setting of sepsis and DKA during the admission in April 2020.   DVT prophylaxis: SCDs in anticipation of procedure. Code Status: Full code. Family Communication: Discussed with patient. Disposition Plan: Home. Consults called: General surgery. Admission status: Inpatient.   Rise Patience MD [facility] Pager [phone].  If 7PM-7AM, please contact night-coverage [email or web address] Password [email or web address]  05/24/2019, 1:18 AM

## 2019-05-24 NOTE — Progress Notes (Addendum)
I have seen and examined the below patient as per my partner Dr. Aleene Davidson  39 male HTN insulin-dependent DM history DKA diabetic infection left foot venous insufficiency history left neck abscess necrotizing fasciitis 11/2018 after doing heavy gardening work developed a musculoskeletal injury that progressed found to have 6 cm inflammatory changes in the neck with left pectoralis muscle enlargement suggestive of myositis Also is status post visit with ENT surgeon Dr. Townsend Roger left upper sternum area not improved with antibiotics and was seen by Dr. Redmond Baseman 01/19/2019-went to the OR for incision and drainage of abscess  Fever, chills, left anterior chest pain-noticed discharge from prior scar-sugars have been running in the 500s to 200s came to hospital-ENT consulted felt this was a new infection Dr. Harlow Asa of general surgery consulted-CT of the chest showed fluid collection left pectoralis major muscle-medical consult requested  Plan   Patient has been compliant her medications and has been taking insulin so I think his sugar was driven up because of his infection  it sounds like general surgery PA did express some purulence however general surgeon is to see and determine if further open debridement of the area as needed-we will keep n.p.o. he understands the same---changing the Long acting insulin form 20 to 10 as is NPO---if no further surgery would need to be place don diet and usual long acting Lantus dose 20 Continue Zosyn vancomycin, saline 125 cc/H He has paroxysmal A. fib so we will continue metoprolol 50 twice daily in addition I expect it will be several days before he can go home    ADDEND I spent over 45 minutes discussing with surgery then with Dr. Redmond Baseman then  CT surgery orthopedics Dr. Erlinda Hong Dr. Kipp Brood finally with CV[facility] plan of care-Dr. Kipp Brood feels patient may require open procedure so we will transfer to [facility]  Verneita Griffes, MD [facility] 8:41 AM

## 2019-05-24 NOTE — Progress Notes (Signed)
Pharmacy Antibiotic Note  Taylor Hughes is a 48 y.o. male admitted on 05/23/2019 with sepsis.   PMH significant for diabetes, necrotizing fasciitis of neck requiring multiple I&D procedures.  Presents today with pain in left chest wall, induration & drainage also noted.  Pharmacy has been consulted for Vancomycin & Zosyn dosing.  05/24/2019:  Tm 100.5 F, subjective report of fever prior to admission  WBC, LA WNL  Scr 1.05- patient's baseline 0.7-0.75  Chest CT shows infected fluid of L pectoralis major  Plan: Zosyn 3.375g IV q8h (4 hour infusion).  Vancomycin 2gm IV x1 then 1250mg  IV q12h to target AUC 400-550 Monitor renal function and cx data  Check Vancomycin levels at steady state   Height: 5\' 11"  (180.3 cm) Weight: 260 lb (117.9 kg) IBW/kg (Calculated) : 75.3  Temp (24hrs), Avg:100.3 F (37.9 C), Min:100.1 F (37.8 C), Max:100.5 F (38.1 C)  Recent Labs  Lab 05/23/19 2133 05/23/19 2326  WBC 8.5  --   CREATININE 1.02  --   LATICACIDVEN 2.2* 1.1    Estimated Creatinine Clearance: 116.9 mL/min (by C-G formula based on SCr of 1.02 mg/dL).    No Known Allergies  Antimicrobials this admission: 10/12 Clindamycin x1 10/13 Zosyn >>  10/13 Vanc >>   Dose adjustments this admission:  Microbiology results: 10/12 BCx:  10/13 UCx:   10/13 COVID:   Thank you for allowing pharmacy to be a part of this patient's care.  Netta Cedars, PharmD, BCPS 05/24/2019 12:28 AM

## 2019-05-24 NOTE — Consult Note (Signed)
[name] [birth year]  [id number].    Requesting MD: Dr. Verlon Au Chief Complaint/Reason for Consult: Left chest wall abscess  HPI: Taylor Hughes is a 48 y.o. male with a history of DM2 who presented to [facility] long for fever and left chest wall pain.  Patient was admitted back in April of this year for left deep space neck abscess/necortizing fasciitis after he apparently sustained a musculoskeletal injury while doing gardening 48 prior to hospitalization.  He underwent I&D by Dr. Redmond Baseman on 4/13.  This area improved but he later the patient developed an area of induration over the left upper sternum that despite appropriate antibiotics did not improve.  He was taken back to the operating room on 6/10 by Dr. Redmond Baseman for an additional incision and drainage.  Patient reports he was doing well since that time.  The areas had healed appropriately with some scab formation.  He reports 5 days ago he accidentally pick the scabs off and had some bleeding following.  In the following days he noticed erythema, swelling underneath the areas of prior I&D sites.  He began having increasing pain, swelling and redness that extended up his chest wall along his left neck and along his left chest.  He reports he had some subjective fever and chills at home.  He reports his sugars have been running in the 500's over the last few days.  He reports last night the area did drain a small amount of fluid but is unsure what it looks like.  On arrival to the emergency department patient was tachycardic at 102, febrile at 100.5, and tachypneic at 25.  His lactic acid was elevated at 2.2 and WBC was noted to be 9.8.  He underwent a CT scan that showed an ill-defined low-attenuation collection in the left pectoralis major that extends more superiorly and medially to the left sternoclavicular joint.  Patient was admitted to medicine service and, started on IV antibiotics and general surgery was consulted.  ROS: Review of Systems    Constitutional: Positive for chills and fever.  Respiratory: Negative for cough and shortness of breath.   Cardiovascular: Negative for chest pain and leg swelling.       Chest wall pain  Gastrointestinal: Negative for abdominal pain, constipation, nausea and vomiting.  Genitourinary: Negative for dysuria.  Musculoskeletal: Positive for myalgias.  Skin: Negative for itching and rash.       Erythema, swelling, drainage  Neurological: Positive for headaches.  Endo/Heme/Allergies:       Hyperglycemia  All other systems reviewed and are negative.   Family History  Problem Relation Age of Onset   Diabetes Mother     Past Medical History:  Diagnosis Date   Atrial fibrillation ([place])    Diabetes mellitus without complication ([place])    Type 2   GERD (gastroesophageal reflux disease)    Wears glasses    Wound, open, foot    left diabetic     Past Surgical History:  Procedure Laterality Date   AMPUTATION Left 08/20/2018   Procedure: LEFT FOOT IRRIGATON AND DEBRIDEMENT, 5TH RAY AMPUTATION;  Surgeon: Newt Minion, MD;  Location: [facility];  Service: Orthopedics;  Laterality: Left;   FINGER SURGERY Right 2016   I&D  small finger   I&D EXTREMITY Left 07/30/2018   Procedure: IRRIGATION AND DEBRIDEMENT LEFT FOOT WITH POSSIBLE AMPUTATION OF FIFTH TOE;  Surgeon: Mcarthur Rossetti, MD;  Location: [facility];  Service: Orthopedics;  Laterality: Left;   INCISION AND DRAINAGE  ABSCESS Left 01/19/2019   Procedure: INCISION AND DRAINAGE ABSCESS upper chest;  Surgeon: Melida Quitter, MD;  Location: [facility];  Service: ENT;  Laterality: Left;   MINOR IRRIGATION AND DEBRIDEMENT OF WOUND N/A 11/22/2018   Procedure: INCISION AND DRAINAGE OF NECK ABSCESS;  Surgeon: Melida Quitter, MD;  Location: [facility];  Service: ENT;  Laterality: N/A;    Social History:  reports that he has never smoked. He has never used smokeless tobacco. He reports that he does not drink alcohol or use drugs.  Allergies: No  Known Allergies  (Not in a hospital admission)    Physical Exam: Blood pressure 132/84, pulse 88, temperature 98.1 F (36.7 C), temperature source Oral, resp. rate (!) 24, height 5\' 11"  (1.803 m), weight 117.9 kg, SpO2 100 %. General: pleasant, WD, WN male who is laying in bed in NAD HEENT: head is normocephalic, atraumatic.  Sclera are noninjected.  PERRL.  Ears and nose without any masses or lesions. Mask over mouth. Heart: regular, rate, and rhythm. Palpable radial and pedal pulses bilaterally. 1-2+ b/l pitting edmea of LE's Lungs: CTAB, no wheezes, rhonchi, or rales noted.  Respiratory effort nonlabored Abd: soft, NT, ND, +BS, no masses, hernias, or organomegaly MS: Moves all 4 extremities Skin: Please see pictures below. The patient has 2 prior incisional wounds. There is fluctuance underlying the superior wound and extending ~5cm laterally and inferiorly. There is some edema that extends to the left neck as seen in the picture below but no obvious fluctuance in this area. There is no active drainage.  There is surrounding erythema and heat extending 15cm to left chest wall. The erythema and heat extend ~8cm from the superior aspect of the wound. I was able to probe the superior wound with a sterile cotton swab and a large amount of foul smelling purulent drainage was expressed. Wound cultures were obtained. He is diaphoretic.  Neuro: A&O x 3. Moves all extremities.  Psych: Alert with an appropriate affect.        Results for orders placed or performed during the hospital encounter of 05/23/19 (from the past 48 hour(s))  CBG monitoring, ED     Status: Abnormal   Collection Time: 05/23/19  8:27 PM  Result Value Ref Range   Glucose-Capillary 189 (H) 70 - 99 mg/dL  Lactic acid, plasma     Status: Abnormal   Collection Time: 05/23/19  9:33 PM  Result Value Ref Range   Lactic Acid, Venous 2.2 (HH) 0.5 - 1.9 mmol/L    Comment: CRITICAL RESULT CALLED TO, READ BACK BY AND VERIFIED  WITH: SONYA LAMB, @ 2240 ON 05/23/2019 C VARNER Performed at [facility], [place] [address], [place], [place] [address]   Comprehensive metabolic panel     Status: Abnormal   Collection Time: 05/23/19  9:33 PM  Result Value Ref Range   Sodium 134 (L) 135 - 145 mmol/L   Potassium 3.6 3.5 - 5.1 mmol/L   Chloride 98 98 - 111 mmol/L   CO2 23 22 - 32 mmol/L   Glucose, Bld 128 (H) 70 - 99 mg/dL   BUN 16 6 - 20 mg/dL   Creatinine, Ser 1.02 0.61 - 1.24 mg/dL   Calcium 8.7 (L) 8.9 - 10.3 mg/dL   Total Protein 7.9 6.5 - 8.1 g/dL   Albumin 2.7 (L) 3.5 - 5.0 g/dL   AST 17 15 - 41 U/L   ALT 12 0 - 44 U/L   Alkaline Phosphatase 79 38 - 126  U/L   Total Bilirubin 0.3 0.3 - 1.2 mg/dL   GFR calc non Af Amer >60 >60 mL/min   GFR calc Af Amer >60 >60 mL/min   Anion gap 13 5 - 15    Comment: Performed at [facility], [place] [address] [place], [place] [address]  CBC with Differential     Status: Abnormal   Collection Time: 05/23/19  9:33 PM  Result Value Ref Range   WBC 8.5 4.0 - 10.5 K/uL   RBC 4.04 (L) 4.22 - 5.81 MIL/uL   Hemoglobin 11.7 (L) 13.0 - 17.0 g/dL   HCT 35.7 (L) 39.0 - 52.0 %   MCV 88.4 80.0 - 100.0 fL   MCH 29.0 26.0 - 34.0 pg   MCHC 32.8 30.0 - 36.0 g/dL   RDW 14.5 11.5 - 15.5 %   Platelets 350 150 - 400 K/uL   nRBC 0.0 0.0 - 0.2 %   Neutrophils Relative % 81 %   Neutro Abs 6.8 1.7 - 7.7 K/uL   Lymphocytes Relative 11 %   Lymphs Abs 1.0 0.7 - 4.0 K/uL   Monocytes Relative 8 %   Monocytes Absolute 0.7 0.1 - 1.0 K/uL   Eosinophils Relative 0 %   Eosinophils Absolute 0.0 0.0 - 0.5 K/uL   Basophils Relative 0 %   Basophils Absolute 0.0 0.0 - 0.1 K/uL   Immature Granulocytes 0 %   Abs Immature Granulocytes 0.01 0.00 - 0.07 K/uL    Comment: Performed at [facility], [place] [address], [place], [place] [address]  Lactic acid, plasma     Status: None   Collection Time: 05/23/19 11:26 PM  Result Value Ref Range   Lactic Acid,  Venous 1.1 0.5 - 1.9 mmol/L    Comment: Performed at [facility], [place] [address], [place], [place] [address]  SARS Coronavirus 2 by RT PCR (hospital order, performed in [facility] hospital lab) Nasopharyngeal Nasopharyngeal Swab     Status: None   Collection Time: 05/24/19 12:26 AM   Specimen: Nasopharyngeal Swab  Result Value Ref Range   SARS Coronavirus 2 NEGATIVE NEGATIVE    Comment: (NOTE) If result is NEGATIVE SARS-CoV-2 target nucleic acids are NOT DETECTED. The SARS-CoV-2 RNA is generally detectable in upper and lower  respiratory specimens during the acute phase of infection. The lowest  concentration of SARS-CoV-2 viral copies this assay can detect is 250  copies / mL. A negative result does not preclude SARS-CoV-2 infection  and should not be used as the sole basis for treatment or other  patient management decisions.  A negative result may occur with  improper specimen collection / handling, submission of specimen other  than nasopharyngeal swab, presence of viral mutation(s) within the  areas targeted by this assay, and inadequate number of viral copies  (<250 copies / mL). A negative result must be combined with clinical  observations, patient history, and epidemiological information. If result is POSITIVE SARS-CoV-2 target nucleic acids are DETECTED. The SARS-CoV-2 RNA is generally detectable in upper and lower  respiratory specimens dur ing the acute phase of infection.  Positive  results are indicative of active infection with SARS-CoV-2.  Clinical  correlation with patient history and other diagnostic information is  necessary to determine patient infection status.  Positive results do  not rule out bacterial infection or co-infection with other viruses. If result is PRESUMPTIVE POSTIVE SARS-CoV-2 nucleic acids MAY BE PRESENT.   A presumptive positive result was obtained on  the submitted specimen  and confirmed on repeat testing.  While 2019  novel coronavirus  (SARS-CoV-2) nucleic acids may be present in the submitted sample  additional confirmatory testing may be necessary for epidemiological  and / or clinical management purposes  to differentiate between  SARS-CoV-2 and other Sarbecovirus currently known to infect humans.  If clinically indicated additional testing with an alternate test  methodology [phone]) is advised. The SARS-CoV-2 RNA is generally  detectable in upper and lower respiratory sp ecimens during the acute  phase of infection. The expected result is Negative. Fact Sheet for Patients:  StrictlyIdeas.no Fact Sheet for Healthcare Providers: BankingDealers.co.za This test is not yet approved or cleared by the [place] FDA and has been authorized for detection and/or diagnosis of SARS-CoV-2 by FDA under an Emergency Use Authorization (EUA).  This EUA will remain in effect (meaning this test can be used) for the duration of the COVID-19 declaration under Section 564(b)(1) of the Act, 21 U.S.C. section 360bbb-3(b)(1), unless the authorization is terminated or revoked sooner. Performed at [facility], [place] [address], [place], [place] [address]   Urinalysis, Routine w reflex microscopic     Status: Abnormal   Collection Time: 05/24/19  1:06 AM  Result Value Ref Range   Color, Urine YELLOW YELLOW   APPearance HAZY (A) CLEAR   Specific Gravity, Urine >1.046 (H) 1.005 - 1.030   pH 5.0 5.0 - 8.0   Glucose, UA 150 (A) NEGATIVE mg/dL   Hgb urine dipstick SMALL (A) NEGATIVE   Bilirubin Urine NEGATIVE NEGATIVE   Ketones, ur 20 (A) NEGATIVE mg/dL   Protein, ur 30 (A) NEGATIVE mg/dL   Nitrite NEGATIVE NEGATIVE   Leukocytes,Ua NEGATIVE NEGATIVE   RBC / HPF 0-5 0 - 5 RBC/hpf   WBC, UA 6-10 0 - 5 WBC/hpf   Bacteria, UA NONE SEEN NONE SEEN   Squamous Epithelial / LPF 0-5 0 - 5   Mucus PRESENT     Comment: Performed at [facility], [place] [address] [place], [place] [address]  CBG monitoring, ED     Status: Abnormal   Collection Time: 05/24/19  5:04 AM  Result Value Ref Range   Glucose-Capillary 181 (H) 70 - 99 mg/dL  Basic metabolic panel     Status: Abnormal   Collection Time: 05/24/19  5:05 AM  Result Value Ref Range   Sodium 133 (L) 135 - 145 mmol/L   Potassium 3.4 (L) 3.5 - 5.1 mmol/L   Chloride 101 98 - 111 mmol/L   CO2 20 (L) 22 - 32 mmol/L   Glucose, Bld 187 (H) 70 - 99 mg/dL   BUN 12 6 - 20 mg/dL   Creatinine, Ser 0.84 0.61 - 1.24 mg/dL   Calcium 7.9 (L) 8.9 - 10.3 mg/dL   GFR calc non Af Amer >60 >60 mL/min   GFR calc Af Amer >60 >60 mL/min   Anion gap 12 5 - 15    Comment: Performed at [facility], [place] [address] [place], [place] [address]  CBC     Status: Abnormal   Collection Time: 05/24/19  5:05 AM  Result Value Ref Range   WBC 9.8 4.0 - 10.5 K/uL   RBC 3.45 (L) 4.22 - 5.81 MIL/uL   Hemoglobin 10.2 (L) 13.0 - 17.0 g/dL   HCT 31.1 (L) 39.0 - 52.0 %   MCV 90.1 80.0 - 100.0 fL   MCH 29.6 26.0 - 34.0 pg   MCHC 32.8  30.0 - 36.0 g/dL   RDW 14.8 11.5 - 15.5 %   Platelets 296 150 - 400 K/uL   nRBC 0.0 0.0 - 0.2 %    Comment: Performed at [facility], [place] [address] [place], [place] [address]  CBG monitoring, ED     Status: Abnormal   Collection Time: 05/24/19  8:00 AM  Result Value Ref Range   Glucose-Capillary 183 (H) 70 - 99 mg/dL   Ct Chest W Contrast  Result Date: 05/23/2019 CLINICAL DATA:  48 year old male with concern for chest wall infection. EXAM: CT CHEST WITH CONTRAST TECHNIQUE: Multidetector CT imaging of the chest was performed during intravenous contrast administration. CONTRAST:  37mL OMNIPAQUE IOHEXOL 300 MG/ML  SOLN COMPARISON:  Chest CT dated 12/17/2018 FINDINGS: Cardiovascular: There is no cardiomegaly or pericardial effusion. Coronary vascular calcifications noted. The thoracic aorta appears unremarkable for the degree  opacification. Evaluation of the pulmonary arteries is very limited due to suboptimal opacification and timing contrast. Mediastinum/Nodes: No hilar or mediastinal adenopathy. Esophagus and thyroid gland are grossly unremarkable. No mediastinal fluid collection. Lungs/Pleura: The lungs are clear. There is no pleural effusion pneumothorax. The central airways are patent. Upper Abdomen: No acute abnormality. Musculoskeletal: There is a 5.6 x 2.8 cm ill-defined low attenuating collection in the left pectoralis major which may represent an old hematoma or infected fluid. This appears to extend more superiorly and medially to the left sternoclavicular joint. There is irregularity and fragmentation of the left sternoclavicular joint which may represent an infectious process. Ill-defined low attenuating tubular area in the midline upper chest and suprasternal notch (series 2, image 16) extends to the skin surface and is concerning for fluid. Ultrasound may provide better evaluation of the fluid collection in the left pectoral and anterior chest wall. Old right posterior rib fractures and multilevel degenerative changes of the spine. No acute osseous pathology. IMPRESSION: 1. Ill-defined low attenuating collection in the left pectoralis major which may represent an old hematoma or infected fluid. This collection appears to extend more superiorly and medially to the left sternoclavicular joint. There is irregularity and fragmentation of the left sternoclavicular joint which may represent an infectious process. Ill-defined low attenuating area in the soft tissues of the midline upper chest and suprasternal notch concerning for fluid. Ultrasound may provide better evaluation of the fluid collection in the left pectoral and anterior chest wall. 2. No acute intrathoracic pathology. Electronically Signed   By: Anner Crete M.D.   On: 05/23/2019 23:14   Anti-infectives (From admission, onward)   Start     Dose/Rate Route  Frequency Ordered Stop   05/24/19 1000  vancomycin (VANCOCIN) 1,250 mg in sodium chloride 0.9 % 250 mL IVPB     1,250 mg 166.7 mL/hr over 90 Minutes Intravenous Every 12 hours 05/24/19 0048     05/24/19 0800  piperacillin-tazobactam (ZOSYN) IVPB 3.375 g     3.375 g 12.5 mL/hr over 240 Minutes Intravenous Every 8 hours 05/24/19 0048     05/24/19 0100  piperacillin-tazobactam (ZOSYN) IVPB 3.375 g     3.375 g 100 mL/hr over 30 Minutes Intravenous  Once 05/24/19 0046 05/24/19 0135   05/24/19 0100  vancomycin (VANCOCIN) 2,000 mg in sodium chloride 0.9 % 500 mL IVPB     2,000 mg 250 mL/hr over 120 Minutes Intravenous  Once 05/24/19 0046 05/24/19 0316   05/23/19 2230  clindamycin (CLEOCIN) IVPB 600 mg     600 mg 100 mL/hr over 30 Minutes Intravenous  Once 05/23/19 2225  05/24/19 0022        Assessment/Plan DM2 GERD Hx of A. Fib  Left chest abscess with sternoclavicular joint involvement  - CT showed an ill-defined low-attenuation collection in the left pectoralis major that extends more superiorly and medially to the left sternoclavicular joint. - Patient has a history of left lower neck and left upper chest abscess drainage by Dr. Redmond Baseman, with the most recent procedure done on 01/19/2019 - I was able to open the prior incision with a sterile cotton swab with a sterile cotton swab and obtain wound cultures.  - Given the involvement of a joint and the location being near Dr. Redmond Baseman prior surgical site, I would recommend Orthopedics and Dr. Redmond Baseman be consulted to determine if further debridement is warranted.  - I agree with IV abx  FEN - NPO VTE - SCDs ID - Currently Vanc and Zosyn  Jillyn Ledger, [facility] Surgery 05/24/2019, 8:06 AM Pager: [phone]

## 2019-05-25 LAB — CBC WITH DIFFERENTIAL/PLATELET
Abs Immature Granulocytes: 0.03 10*3/uL (ref 0.00–0.07)
Basophils Absolute: 0 10*3/uL (ref 0.0–0.1)
Basophils Relative: 0 %
Eosinophils Absolute: 0 10*3/uL (ref 0.0–0.5)
Eosinophils Relative: 0 %
HCT: 32.4 % — ABNORMAL LOW (ref 39.0–52.0)
Hemoglobin: 10.4 g/dL — ABNORMAL LOW (ref 13.0–17.0)
Immature Granulocytes: 0 %
Lymphocytes Relative: 19 %
Lymphs Abs: 1.5 10*3/uL (ref 0.7–4.0)
MCH: 29.2 pg (ref 26.0–34.0)
MCHC: 32.1 g/dL (ref 30.0–36.0)
MCV: 91 fL (ref 80.0–100.0)
Monocytes Absolute: 0.8 10*3/uL (ref 0.1–1.0)
Monocytes Relative: 10 %
Neutro Abs: 5.7 10*3/uL (ref 1.7–7.7)
Neutrophils Relative %: 71 %
Platelets: 307 10*3/uL (ref 150–400)
RBC: 3.56 MIL/uL — ABNORMAL LOW (ref 4.22–5.81)
RDW: 14.9 % (ref 11.5–15.5)
WBC: 8.1 10*3/uL (ref 4.0–10.5)
nRBC: 0 % (ref 0.0–0.2)

## 2019-05-25 LAB — BASIC METABOLIC PANEL
Anion gap: 11 (ref 5–15)
BUN: 12 mg/dL (ref 6–20)
CO2: 22 mmol/L (ref 22–32)
Calcium: 8 mg/dL — ABNORMAL LOW (ref 8.9–10.3)
Chloride: 104 mmol/L (ref 98–111)
Creatinine, Ser: 1.03 mg/dL (ref 0.61–1.24)
GFR calc Af Amer: >60 mL/min (ref >60)
GFR calc non Af Amer: >60 mL/min (ref >60)
Glucose, Bld: 187 mg/dL — ABNORMAL HIGH (ref 70–99)
Potassium: 3.5 mmol/L (ref 3.5–5.1)
Sodium: 137 mmol/L (ref 135–145)

## 2019-05-25 LAB — GLUCOSE, CAPILLARY
Glucose-Capillary: 162 mg/dL — ABNORMAL HIGH (ref 70–99)
Glucose-Capillary: 168 mg/dL — ABNORMAL HIGH (ref 70–99)
Glucose-Capillary: 249 mg/dL — ABNORMAL HIGH (ref 70–99)
Glucose-Capillary: 280 mg/dL — ABNORMAL HIGH (ref 70–99)
Glucose-Capillary: 339 mg/dL — ABNORMAL HIGH (ref 70–99)
Glucose-Capillary: 359 mg/dL — ABNORMAL HIGH (ref 70–99)

## 2019-05-25 MED ORDER — MORPHINE SULFATE (PF) 2 MG/ML IV SOLN
1.0000 mg | INTRAVENOUS | Status: DC | PRN
  Administered 2019-05-30 – 2019-06-06 (×6): 1 mg via INTRAVENOUS
  Filled 2019-05-25 (×6): qty 1

## 2019-05-25 MED ORDER — SODIUM CHLORIDE 0.9 % IV SOLN
INTRAVENOUS | Status: DC
  Administered 2019-05-25 – 2019-05-26 (×3): via INTRAVENOUS

## 2019-05-25 MED ORDER — HYDROCODONE-ACETAMINOPHEN 5-325 MG PO TABS
1.0000 | ORAL_TABLET | ORAL | Status: AC | PRN
  Administered 2019-05-25: 21:00:00 1 via ORAL
  Filled 2019-05-25: qty 1

## 2019-05-25 NOTE — Progress Notes (Signed)
Inpatient Diabetes Program Recommendations  AACE/ADA: New Consensus Statement on Inpatient Glycemic Control (2015)  Target Ranges:  Prepandial:   less than 140 mg/dL      Peak postprandial:   less than 180 mg/dL (1-2 hours)      Critically ill patients:  140 - 180 mg/dL   Lab Results  Component Value Date   GLUCAP 168 (H) 05/25/2019   HGBA1C 11.4 (H) 11/25/2018    Review of Glycemic Control Results for Taylor Hughes, Taylor Hughes (MRN [id number]) as of 05/25/2019 10:08  Ref. Range 05/24/2019 08:00 05/24/2019 14:11 05/24/2019 16:35 05/24/2019 21:01 05/25/2019 00:08 05/25/2019 04:12 05/25/2019 07:30  Glucose-Capillary Latest Ref Range: 70 - 99 mg/dL 183 (H) 216 (H) 322 (H) 330 (H) 359 (H) 162 (H) 168 (H)   Diabetes history: DM 2 Outpatient Diabetes medications: Levemir 20 units            Humalog 12 units at lunch, may need 2-3 more units if glucose > 395 Current orders for Inpatient glycemic control:  Levemir 10 units Daily Novolog 0-9 units q4  Inpatient Diabetes Program Recommendations:    Fasting glucose 168 this am. Note post prandial glucose trends increase.  Consider Novolog 4 units tid meal coverage when pt is eating.  Note pt to be transferred to [facility]  Thanks,  Tama Headings RN, MSN, BC-ADM Inpatient Diabetes Coordinator Team Pager [phone] (8a-5p)

## 2019-05-25 NOTE — Progress Notes (Signed)
PROGRESS NOTE    Taylor Hughes  [id number] DOB: 10-22-1970 DOA: 05/23/2019 PCP: Charlott Rakes, MD    Brief Narrative:  48 yo male who presented with fever and left anterior chest wall pain.  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  Patient reported fevers and chest pain for about 2 days.  No history of local trauma.  On his initial physical examination he was febrile 100.5 F, blood pressure 110/75, heart rate 108, respiratory 24, oxygen saturation 93%, his lungs are clear to auscultation bilaterally, heart S1-S2 present rhythmic, abdomen soft, no lower extremity edema.  Tenderness in the left anterior chest wall with positive local induration.  2 opening wounds in the mid upper anterior thorax.  Sodium 134, potassium 3.6, chloride 98, bicarb 23, glucose 128, BUN 16, creatinine 1.0, lactic acid 2.2, white cell count 8.5, hemoglobin 11.7, hematocrit 35.7, platelets 350.  SARS COVID-19 was negative.  Urine analysis specific gravity more than 1.046, 30 protein, glucose 150.  CT chest with old hematoma/infected fluid left pectoralis major, collection extending superiorly and medially to the left sternoclavicular joint.   Patient was admitted to the hospital working diagnosis of sepsis due to left pectoralis muscle cellulitis/abscess.  Assessment & Plan:   Active Problems:   Essential hypertension   Type 2 diabetes mellitus with other specified complication ([place])   Sepsis ([place])   1. Left pectoralis muscle cellulitis/ abscess. Patient continue to have local pain, currently is pending transfer to [facility] for further debridement per CT surgery. His blood pressure is 117/72 and HR 84, wbc 8.1, cultures with gram positive cocci in clusters. Will continue antibiotic therapy with IV vancomycin and IV zosyn. Will continue pain control with morphine for sever pain and hydrocodone  for moderate pain. Will advance diet for now. Continue with saline IV at 50 ml per H.  2. HTN. Continue blood pressure control with metoprolol.  3. T2DM. Fasting glucose this am 187. Will continue glucose control with insulin sliding scale and basal insulin with 10 units of insulin detemir.      DVT prophylaxis: enoxaparin   Code Status:  full Family Communication: no family at the bedside  Disposition Plan/ discharge barriers: pending clinical improvement   Body mass index is 36.26 kg/m. Malnutrition Type:      Malnutrition Characteristics:      Nutrition Interventions:     RN Pressure Injury Documentation:     Consultants:     Procedures:     Antimicrobials:   Vancomycin   Zosyn    Subjective: Patient continue to have pain on left anterior thorax, moderate in intensity, worse to touch and movement, no radiation, no associated nausea or vomiting. Positive purulence through open wounds.   Objective: Vitals:   05/24/19 1230 05/24/19 1318 05/24/19 2106 05/25/19 0416  BP: 115/73 137/76 135/76 121/76  Pulse: 95 94 (!) 104 77  Resp: (!) 24 18 20 18   Temp:   (!) 100.5 F (38.1 C) 99.1 F (37.3 C)  TempSrc:   Oral Oral  SpO2: 95% 97% 96% 96%  Weight:      Height:        Intake/Output Summary (Last 24 hours) at 05/25/2019 0846 Last data filed at 05/25/2019 0014 Gross per 24 hour  Intake 2370 ml  Output 200 ml  Net 2170 ml   Filed Weights   05/23/19 2026  Weight: 117.9 kg  Examination:   General: Not in pain or dyspnea, deconditioned  Neurology: Awake and alert, non focal  E ENT: no pallor, no icterus, oral mucosa moist Cardiovascular: No JVD. S1-S2 present, rhythmic, no gallops, rubs, or murmurs. ++/+++ non pitting bilateral lower extremity edema. Pulmonary: positive breath sounds bilaterally, adequate air movement, no wheezing, rhonchi or rales. Gastrointestinal. Abdomen with no organomegaly, non tender, no rebound or guarding Skin.  Left pectoralis indurated area, about 5 cm diameter, tender to palpation, positive skin discoloration and increased local temperature, 2 open wounds at the base of the anterior neck.  Musculoskeletal: no joint deformities     Data Reviewed: I have personally reviewed following labs and imaging studies  CBC: Recent Labs  Lab 05/23/19 2133 05/24/19 0505 05/25/19 0456  WBC 8.5 9.8 8.1  NEUTROABS 6.8  --  5.7  HGB 11.7* 10.2* 10.4*  HCT 35.7* 31.1* 32.4*  MCV 88.4 90.1 91.0  PLT 350 296 [id number]   Basic Metabolic Panel: Recent Labs  Lab 05/23/19 2133 05/24/19 0505 05/25/19 0456  NA 134* 133* 137  K 3.6 3.4* 3.5  CL 98 101 104  CO2 23 20* 22  GLUCOSE 128* 187* 187*  BUN 16 12 12   CREATININE 1.02 0.84 1.03  CALCIUM 8.7* 7.9* 8.0*   GFR: Estimated Creatinine Clearance: 115.7 mL/min (by C-G formula based on SCr of 1.03 mg/dL). Liver Function Tests: Recent Labs  Lab 05/23/19 2133  AST 17  ALT 12  ALKPHOS 79  BILITOT 0.3  PROT 7.9  ALBUMIN 2.7*   No results for input(s): LIPASE, AMYLASE in the last 168 hours. No results for input(s): AMMONIA in the last 168 hours. Coagulation Profile: No results for input(s): INR, PROTIME in the last 168 hours. Cardiac Enzymes: No results for input(s): CKTOTAL, CKMB, CKMBINDEX, TROPONINI in the last 168 hours. BNP (last 3 results) No results for input(s): PROBNP in the last 8760 hours. HbA1C: No results for input(s): HGBA1C in the last 72 hours. CBG: Recent Labs  Lab 05/24/19 1635 05/24/19 2101 05/25/19 0008 05/25/19 0412 05/25/19 0730  GLUCAP 322* 330* 359* 162* 168*   Lipid Profile: No results for input(s): CHOL, HDL, LDLCALC, TRIG, CHOLHDL, LDLDIRECT in the last 72 hours. Thyroid Function Tests: No results for input(s): TSH, T4TOTAL, FREET4, T3FREE, THYROIDAB in the last 72 hours. Anemia Panel: No results for input(s): VITAMINB12, FOLATE, FERRITIN, TIBC, IRON, RETICCTPCT in the last 72 hours.    Radiology Studies: I  have reviewed all of the imaging during this hospital visit personally     Scheduled Meds: . insulin aspart  0-9 Units Subcutaneous Q4H  . insulin detemir  10 Units Subcutaneous Daily  . metoprolol tartrate  50 mg Oral BID   Continuous Infusions: . sodium chloride    . piperacillin-tazobactam (ZOSYN)  IV 3.375 g (05/25/19 0807)  . vancomycin 1,250 mg (05/24/19 2234)     LOS: 1 day        Mauricio Gerome Apley, MD

## 2019-05-25 NOTE — Progress Notes (Signed)
Noted plans for transfer to [facility] for patient to be evaluated by CT surgery, possibly ortho and ENT.  Patient has infection of previously debrided site by Dr. Redmond Baseman earlier in the summer.  Given the location of this infection, no current role for general surgery.  We will sign off at this time.  Taylor Hughes 7:20 AM 05/25/2019

## 2019-05-25 NOTE — Plan of Care (Signed)
Patient awaiting bed at [facility], medicated for pain x 1 this shift with improvement.  Tolerating carb mod diet.

## 2019-05-26 LAB — CBC WITH DIFFERENTIAL/PLATELET
Abs Immature Granulocytes: 0.03 10*3/uL (ref 0.00–0.07)
Basophils Absolute: 0 10*3/uL (ref 0.0–0.1)
Basophils Relative: 0 %
Eosinophils Absolute: 0 10*3/uL (ref 0.0–0.5)
Eosinophils Relative: 1 %
HCT: 32.8 % — ABNORMAL LOW (ref 39.0–52.0)
Hemoglobin: 10.4 g/dL — ABNORMAL LOW (ref 13.0–17.0)
Immature Granulocytes: 0 %
Lymphocytes Relative: 20 %
Lymphs Abs: 1.4 10*3/uL (ref 0.7–4.0)
MCH: 29.1 pg (ref 26.0–34.0)
MCHC: 31.7 g/dL (ref 30.0–36.0)
MCV: 91.9 fL (ref 80.0–100.0)
Monocytes Absolute: 0.7 10*3/uL (ref 0.1–1.0)
Monocytes Relative: 10 %
Neutro Abs: 4.8 10*3/uL (ref 1.7–7.7)
Neutrophils Relative %: 69 %
Platelets: 310 10*3/uL (ref 150–400)
RBC: 3.57 MIL/uL — ABNORMAL LOW (ref 4.22–5.81)
RDW: 14.8 % (ref 11.5–15.5)
WBC: 7 10*3/uL (ref 4.0–10.5)
nRBC: 0 % (ref 0.0–0.2)

## 2019-05-26 LAB — GLUCOSE, CAPILLARY
Glucose-Capillary: 209 mg/dL — ABNORMAL HIGH (ref 70–99)
Glucose-Capillary: 230 mg/dL — ABNORMAL HIGH (ref 70–99)
Glucose-Capillary: 247 mg/dL — ABNORMAL HIGH (ref 70–99)
Glucose-Capillary: 260 mg/dL — ABNORMAL HIGH (ref 70–99)
Glucose-Capillary: 275 mg/dL — ABNORMAL HIGH (ref 70–99)
Glucose-Capillary: 319 mg/dL — ABNORMAL HIGH (ref 70–99)

## 2019-05-26 LAB — BASIC METABOLIC PANEL
Anion gap: 8 (ref 5–15)
BUN: 8 mg/dL (ref 6–20)
CO2: 21 mmol/L — ABNORMAL LOW (ref 22–32)
Calcium: 8 mg/dL — ABNORMAL LOW (ref 8.9–10.3)
Chloride: 104 mmol/L (ref 98–111)
Creatinine, Ser: 0.92 mg/dL (ref 0.61–1.24)
GFR calc Af Amer: >60 mL/min (ref >60)
GFR calc non Af Amer: >60 mL/min (ref >60)
Glucose, Bld: 240 mg/dL — ABNORMAL HIGH (ref 70–99)
Potassium: 3.5 mmol/L (ref 3.5–5.1)
Sodium: 133 mmol/L — ABNORMAL LOW (ref 135–145)

## 2019-05-26 MED ORDER — INSULIN GLARGINE 100 UNIT/ML ~~LOC~~ SOLN: 10.0000 [IU] | Freq: Every day | SUBCUTANEOUS | Status: DC

## 2019-05-26 MED ORDER — HYDROCODONE-ACETAMINOPHEN 5-325 MG PO TABS
1.0000 | ORAL_TABLET | Freq: Four times a day (QID) | ORAL | Status: DC | PRN
  Administered 2019-05-28 – 2019-06-07 (×17): 1 via ORAL
  Filled 2019-05-26 (×17): qty 1

## 2019-05-26 MED ORDER — INSULIN DETEMIR 100 UNIT/ML ~~LOC~~ SOLN
20.0000 [IU] | Freq: Every day | SUBCUTANEOUS | Status: DC
  Administered 2019-05-27 – 2019-05-31 (×3): 20 [IU] via SUBCUTANEOUS
  Filled 2019-05-26 (×5): qty 0.2

## 2019-05-26 NOTE — Progress Notes (Signed)
Inpatient Diabetes Program Recommendations  AACE/ADA: New Consensus Statement on Inpatient Glycemic Control (2015)  Target Ranges:  Prepandial:   less than 140 mg/dL      Peak postprandial:   less than 180 mg/dL (1-2 hours)      Critically ill patients:  140 - 180 mg/dL   Lab Results  Component Value Date   GLUCAP 209 (H) 05/26/2019   HGBA1C 11.4 (H) 11/25/2018    Review of Glycemic Control Results for KYMARION, BARKLEY (MRN [id number]) as of 05/25/2019 10:08  Ref. Range 05/24/2019 08:00 05/24/2019 14:11 05/24/2019 16:35 05/24/2019 21:01 05/25/2019 00:08 05/25/2019 04:12 05/25/2019 07:30  Glucose-Capillary Latest Ref Range: 70 - 99 mg/dL 183 (H) 216 (H) 322 (H) 330 (H) 359 (H) 162 (H) 168 (H)  Results for JAMA[place], AGUAYO (MRN [id number]) as of 05/26/2019 10:02  Ref. Range 05/25/2019 07:30 05/25/2019 12:22 05/25/2019 16:45 05/25/2019 20:09 05/26/2019 00:48 05/26/2019 05:00 05/26/2019 08:05  Glucose-Capillary Latest Ref Range: 70 - 99 mg/dL 168 (H) 249 (H) 339 (H) 280 (H) 260 (H) 230 (H) 209 (H)   Diabetes history: DM 2 Outpatient Diabetes medications: Levemir 20 units            Humalog 12 units at lunch, may need 2-3 more units if glucose > 395 Current orders for Inpatient glycemic control:  Levemir 10 units Daily Novolog 0-9 units q4  Inpatient Diabetes Program Recommendations:    Fasting glucose 209 this am. Note post prandial glucose trends increase.  Consider Novolog 4 units tid meal coverage when pt is eating.  May need to increase Levemir to 12 units.  Note pt to be transferred to [facility].  Thanks,  Tama Headings RN, MSN, BC-ADM Inpatient Diabetes Coordinator Team Pager [phone] (8a-5p)

## 2019-05-26 NOTE — Progress Notes (Signed)
Report called to Trilby Drummer, Therapist, sports at [facility].  All questions answered.  VSS.

## 2019-05-26 NOTE — Progress Notes (Addendum)
PROGRESS NOTE    Taylor Hughes  [id number] DOB: 1971/02/04 DOA: 05/23/2019 PCP: Charlott Rakes, MD    Brief Narrative:  49 yo male who presented with fever and left anterior chest wall pain.  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  Patient reported fevers and chest pain for about 2 days.  No history of local trauma.  On his initial physical examination he was febrile 100.5 F, blood pressure 110/75, heart rate 108, respiratory 24, oxygen saturation 93%, his lungs are clear to auscultation bilaterally, heart S1-S2 present rhythmic, abdomen soft, no lower extremity edema.  Tenderness in the left anterior chest wall with positive local induration.  2 opening wounds in the mid upper anterior thorax.  Sodium 134, potassium 3.6, chloride 98, bicarb 23, glucose 128, BUN 16, creatinine 1.0, lactic acid 2.2, white cell count 8.5, hemoglobin 11.7, hematocrit 35.7, platelets 350.  SARS COVID-19 was negative.  Urine analysis specific gravity more than 1.046, 30 protein, glucose 150.  CT chest with old hematoma/infected fluid left pectoralis major, collection extending superiorly and medially to the left sternoclavicular joint.   Patient was admitted to the hospital working diagnosis of sepsis due to left pectoralis muscle cellulitis/abscess.  Patient tolerating weill IV antibiotic therapy with Vancomycin and Zosyn, he has remained afebrile with no leukocytosis. Persistent hard collection on his left anterior chest wall.  Today patient transferred to [facility] for debridement.    Assessment & Plan:   Active Problems:   Essential hypertension   Type 2 diabetes mellitus with other specified complication ([place])   Sepsis ([place])    1. Left pectoralis muscle cellulitis/ abscess. Complicated with sepsis. Today patient transfer to [facility] for further debridement per CT surgery.  wbc 7,0,  cultures with gram positive cocci in clusters. Continue with IV vancomycin and IV zosyn. Pain control with morphine for and hydrocodone. Patient is tolerating well po, will hold on IV fluids.  2. HTN. On metoprolol for blood pressure control.   3. T2DM. Fasting glucose this am 240, capillary glucose 260, 230, 209, 275, 319. Will increase to 20 units of basal insulin, with glargine and continue to follow on capillary glucose and further insulin requirements.   4. Obesity. His calculated BMI is 36,2,   DVT prophylaxis: enoxaparin   Code Status:  full Family Communication: no family at the bedside  Disposition Plan/ discharge barriers: transferred to [facility], for debridement.   Body mass index is 36.26 kg/m. Malnutrition Type:      Malnutrition Characteristics:      Nutrition Interventions:     RN Pressure Injury Documentation:     Consultants:   Surgery   CT surgery   Procedures:     Antimicrobials:    Vancomycin and Zosyn.    Subjective: Patient continue to have pectoral pain, worse to touch and movement, no nausea or vomiting, associated, no radiation, improved with analgesics.   Objective: Vitals:   05/25/19 1352 05/25/19 2013 05/25/19 2107 05/26/19 0502  BP: 117/72 (!) 146/96 (!) 146/96 121/80  Pulse: 84 93 93 92  Resp:  18  18  Temp: 98.2 F (36.8 C) 98.9 F (37.2 C)  99.9 F (37.7 C)  TempSrc: Oral Oral  Oral  SpO2: 98% 100%  99%  Weight:      Height:        Intake/Output Summary (Last 24 hours) at 05/26/2019 1116 Last data filed at  05/26/2019 0657 Gross per 24 hour  Intake 1492.05 ml  Output -  Net 1492.05 ml   Filed Weights   05/23/19 2026  Weight: 117.9 kg    Examination:   General: Not in pain or dyspnea, deconditioned  Neurology: Awake and alert, non focal  E ENT: mild pallor, no icterus, oral mucosa moist Cardiovascular: No JVD. S1-S2 present, rhythmic, no gallops, rubs, or murmurs. No lower extremity edema. Pulmonary:  positive breath sounds bilaterally, adequate air movement, no wheezing, rhonchi or rales. Gastrointestinal. Abdomen with, no organomegaly, non tender, no rebound or guarding Skin. Indurated area at the left pectoralis muscle, tender to palpation.  Musculoskeletal: no joint deformities     Data Reviewed: I have personally reviewed following labs and imaging studies  CBC: Recent Labs  Lab 05/23/19 2133 05/24/19 0505 05/25/19 0456 05/26/19 0431  WBC 8.5 9.8 8.1 7.0  NEUTROABS 6.8  --  5.7 4.8  HGB 11.7* 10.2* 10.4* 10.4*  HCT 35.7* 31.1* 32.4* 32.8*  MCV 88.4 90.1 91.0 91.9  PLT 350 296 307 [id number]   Basic Metabolic Panel: Recent Labs  Lab 05/23/19 2133 05/24/19 0505 05/25/19 0456 05/26/19 0431  NA 134* 133* 137 133*  K 3.6 3.4* 3.5 3.5  CL 98 101 104 104  CO2 23 20* 22 21*  GLUCOSE 128* 187* 187* 240*  BUN 16 12 12 8   CREATININE 1.02 0.84 1.03 0.92  CALCIUM 8.7* 7.9* 8.0* 8.0*   GFR: Estimated Creatinine Clearance: 129.6 mL/min (by C-G formula based on SCr of 0.92 mg/dL). Liver Function Tests: Recent Labs  Lab 05/23/19 2133  AST 17  ALT 12  ALKPHOS 79  BILITOT 0.3  PROT 7.9  ALBUMIN 2.7*   No results for input(s): LIPASE, AMYLASE in the last 168 hours. No results for input(s): AMMONIA in the last 168 hours. Coagulation Profile: No results for input(s): INR, PROTIME in the last 168 hours. Cardiac Enzymes: No results for input(s): CKTOTAL, CKMB, CKMBINDEX, TROPONINI in the last 168 hours. BNP (last 3 results) No results for input(s): PROBNP in the last 8760 hours. HbA1C: No results for input(s): HGBA1C in the last 72 hours. CBG: Recent Labs  Lab 05/25/19 1645 05/25/19 2009 05/26/19 0048 05/26/19 0500 05/26/19 0805  GLUCAP 339* 280* 260* 230* 209*   Lipid Profile: No results for input(s): CHOL, HDL, LDLCALC, TRIG, CHOLHDL, LDLDIRECT in the last 72 hours. Thyroid Function Tests: No results for input(s): TSH, T4TOTAL, FREET4, T3FREE, THYROIDAB in the  last 72 hours. Anemia Panel: No results for input(s): VITAMINB12, FOLATE, FERRITIN, TIBC, IRON, RETICCTPCT in the last 72 hours.    Radiology Studies: I have reviewed all of the imaging during this hospital visit personally     Scheduled Meds: . insulin aspart  0-9 Units Subcutaneous Q4H  . insulin detemir  10 Units Subcutaneous Daily  . metoprolol tartrate  50 mg Oral BID   Continuous Infusions: . sodium chloride 50 mL/hr at 05/26/19 0425  . piperacillin-tazobactam (ZOSYN)  IV 3.375 g (05/26/19 0921)  . vancomycin 1,250 mg (05/26/19 1105)     LOS: 2 days         Gerome Apley, MD

## 2019-05-27 DIAGNOSIS — L03313 Cellulitis of chest wall: Secondary | ICD-10-CM

## 2019-05-27 DIAGNOSIS — E119 Type 2 diabetes mellitus without complications: Secondary | ICD-10-CM

## 2019-05-27 DIAGNOSIS — I4819 Other persistent atrial fibrillation: Secondary | ICD-10-CM

## 2019-05-27 DIAGNOSIS — A419 Sepsis, unspecified organism: Secondary | ICD-10-CM

## 2019-05-27 LAB — VANCOMYCIN, PEAK: Vancomycin Pk: 27 ug/mL — ABNORMAL LOW (ref 30–40)

## 2019-05-27 LAB — GLUCOSE, CAPILLARY
Glucose-Capillary: 110 mg/dL — ABNORMAL HIGH (ref 70–99)
Glucose-Capillary: 128 mg/dL — ABNORMAL HIGH (ref 70–99)
Glucose-Capillary: 163 mg/dL — ABNORMAL HIGH (ref 70–99)
Glucose-Capillary: 206 mg/dL — ABNORMAL HIGH (ref 70–99)
Glucose-Capillary: 210 mg/dL — ABNORMAL HIGH (ref 70–99)
Glucose-Capillary: 269 mg/dL — ABNORMAL HIGH (ref 70–99)

## 2019-05-27 LAB — MRSA PCR SCREENING: MRSA by PCR: NEGATIVE

## 2019-05-27 LAB — VANCOMYCIN, TROUGH: Vancomycin Tr: 15 ug/mL (ref 15–20)

## 2019-05-27 LAB — HEMOGLOBIN A1C
Hgb A1c MFr Bld: 12.7 % — ABNORMAL HIGH (ref 4.8–5.6)
Mean Plasma Glucose: 317.79 mg/dL

## 2019-05-27 MED ORDER — SENNOSIDES-DOCUSATE SODIUM 8.6-50 MG PO TABS
2.0000 | ORAL_TABLET | Freq: Two times a day (BID) | ORAL | Status: DC
  Administered 2019-05-27 – 2019-06-03 (×2): 2 via ORAL
  Filled 2019-05-27 (×16): qty 2

## 2019-05-27 MED ORDER — INSULIN ASPART 100 UNIT/ML ~~LOC~~ SOLN
0.0000 [IU] | Freq: Three times a day (TID) | SUBCUTANEOUS | Status: DC
  Administered 2019-05-27: 18:00:00 3 [IU] via SUBCUTANEOUS
  Administered 2019-05-27: 11 [IU] via SUBCUTANEOUS
  Administered 2019-05-28: 14:00:00 3 [IU] via SUBCUTANEOUS
  Administered 2019-05-28 – 2019-05-29 (×2): 7 [IU] via SUBCUTANEOUS
  Administered 2019-05-29: 11 [IU] via SUBCUTANEOUS
  Administered 2019-05-30: 4 [IU] via SUBCUTANEOUS
  Administered 2019-05-30 – 2019-05-31 (×4): 11 [IU] via SUBCUTANEOUS
  Administered 2019-05-31: 13:00:00 2 [IU] via SUBCUTANEOUS
  Administered 2019-06-01: 12:00:00 4 [IU] via SUBCUTANEOUS
  Administered 2019-06-01: 08:00:00 3 [IU] via SUBCUTANEOUS

## 2019-05-27 MED ORDER — INSULIN ASPART 100 UNIT/ML ~~LOC~~ SOLN
0.0000 [IU] | Freq: Every day | SUBCUTANEOUS | Status: DC
  Administered 2019-05-31: 22:00:00 3 [IU] via SUBCUTANEOUS

## 2019-05-27 NOTE — Consult Note (Addendum)
[address]       [place] [address]             [phone]        Taylor Hughes [place] Medical Record #[id number] Date of Birth: 1971-02-15  Referring: Dr. Nevada Crane Primary Care: Charlott Rakes, MD Primary Cardiologist:No primary care provider on file.  Chief Complaint:    Chief Complaint  Patient presents with   Fever   Fatigue   History of Present Illness:      Taylor Hughes is a 48 yo AA male with history of uncontrolled DM, Atrial Fibrillation, and HTN.  He was admitted back in April 2020 with neck abscess.  He underwent Debridement by Dr. Redmond Baseman at that time with cultures growing Staph Aureus.  He required take back to the OR in June as the neck wound was not progressing as it should.  This was again performed by Dr. Redmond Baseman.  He presented to the ED on 05/23/2019 with complaints of fever and fatigue for 2 days.  This was accompanied by pain around his chest wound with radiation in his left arm.  Workup in the ED showed left anterior chest wall incision to be raised with yellow drainage present.  There was surrounding hyperpigmentation as well, with indurated area lateral to the area.  CT scan of the chest was obtained and showed a fluid collection around the left pectoralis muscle. He was started on IV ABX and admitted to the hospital for further care.  General surgery consult was obtained, they were able to express some purulence from the wound.  However, they felt wound should be managed by orthopedics or CT surgery.  Upon further evaluation [facility] felt the patient would best be managed with surgical debridement.  [facility].  Currently patient is stable.  He is no longer febrile, his leukocytosis has normalized.  The patient has chronic issues with poorly managed diabetes.  He has required several hospitalizations due to diabetes complications.  The patient admits he needs to get his diabetes under better control.  He is agreeable to  proceed with surgery.  Current Activity/ Functional Status: Patient is independent with mobility/ambulation, transfers, ADL's, IADL's.  Past Medical History:  Diagnosis Date   Atrial fibrillation ([place])    Diabetes mellitus without complication ([place])    Type 2   GERD (gastroesophageal reflux disease)    Wears glasses    Wound, open, foot    left diabetic     Past Surgical History:  Procedure Laterality Date   AMPUTATION Left 08/20/2018   Procedure: LEFT FOOT IRRIGATON AND DEBRIDEMENT, 5TH RAY AMPUTATION;  Surgeon: Newt Minion, MD;  Location: [facility];  Service: Orthopedics;  Laterality: Left;   FINGER SURGERY Right 2016   I&D  small finger   I&D EXTREMITY Left 07/30/2018   Procedure: IRRIGATION AND DEBRIDEMENT LEFT FOOT WITH POSSIBLE AMPUTATION OF FIFTH TOE;  Surgeon: Mcarthur Rossetti, MD;  Location: [facility];  Service: Orthopedics;  Laterality: Left;   INCISION AND DRAINAGE ABSCESS Left 01/19/2019   Procedure: INCISION AND DRAINAGE ABSCESS upper chest;  Surgeon: Melida Quitter, MD;  Location: [facility];  Service: ENT;  Laterality: Left;   MINOR IRRIGATION AND DEBRIDEMENT OF WOUND N/A 11/22/2018   Procedure: INCISION AND DRAINAGE OF NECK ABSCESS;  Surgeon: Melida Quitter, MD;  Location: [facility];  Service: ENT;  Laterality: N/A;    Social History   Tobacco Use  Smoking Status  Never Smoker  Smokeless Tobacco Never Used    Social History   Substance and Sexual Activity  Alcohol Use No   Alcohol/week: 0.0 standard drinks     No Known Allergies  Current Facility-Administered Medications  Medication Dose Route Frequency Provider Last Rate Last Dose   acetaminophen (TYLENOL) tablet 650 mg  650 mg Oral Q6H PRN Rise Patience, MD   650 mg at 05/24/19 0146   Or   acetaminophen (TYLENOL) suppository 650 mg  650 mg Rectal Q6H PRN Rise Patience, MD       HYDROcodone-acetaminophen (NORCO/VICODIN) 5-325 MG per tablet 1 tablet  1 tablet Oral Q6H PRN Arrien,  Jimmy Picket, MD       insulin aspart (novoLOG) injection 0-20 Units  0-20 Units Subcutaneous TID WC Hall, Carole N, DO       insulin aspart (novoLOG) injection 0-5 Units  0-5 Units Subcutaneous QHS Hall, Carole N, DO       insulin detemir (LEVEMIR) injection 20 Units  20 Units Subcutaneous Daily Tawni Millers, MD   20 Units at 05/27/19 1010   metoprolol tartrate (LOPRESSOR) tablet 50 mg  50 mg Oral BID Rise Patience, MD   50 mg at 05/27/19 1010   morphine 2 MG/ML injection 1 mg  1 mg Intravenous Q2H PRN Arrien, Jimmy Picket, MD       ondansetron [facility]) tablet 4 mg  4 mg Oral Q6H PRN Rise Patience, MD       Or   ondansetron [facility]) injection 4 mg  4 mg Intravenous Q6H PRN Rise Patience, MD       piperacillin-tazobactam (ZOSYN) IVPB 3.375 g  3.375 g Intravenous Q8H Rise Patience, MD 12.5 mL/hr at 05/27/19 1017 3.375 g at 05/27/19 1017   senna-docusate (Senokot-S) tablet 2 tablet  2 tablet Oral BID Irene Pap N, DO       vancomycin (VANCOCIN) 1,250 mg in sodium chloride 0.9 % 250 mL IVPB  1,250 mg Intravenous Q12H Rise Patience, MD 166.7 mL/hr at 05/27/19 1019 1,250 mg at 05/27/19 1019    Medications Prior to Admission  Medication Sig Dispense Refill Last Dose   ibuprofen (ADVIL) 200 MG tablet Take 400 mg by mouth every 6 (six) hours as needed for moderate pain.   Past Month at Unknown time   insulin detemir (LEVEMIR) 100 UNIT/ML injection Inject 0.2 mLs (20 Units total) into the skin daily. 30 mL 3 05/23/2019 at Unknown time   insulin lispro (HUMALOG) 100 UNIT/ML injection Inject 2-12 Units into the skin See admin instructions. Inject 12 units daily with lunch & may take an additional 2-3 units if needed for blood sugars greater than 395.   05/23/2019 at Unknown time   metoprolol tartrate (LOPRESSOR) 50 MG tablet Take 1 tablet (50 mg total) by mouth 2 (two) times daily for 30 days. 180 tablet 1 05/23/2019 at 1200   aspirin EC  81 MG tablet Take 1 tablet (81 mg total) by mouth daily. (Patient not taking: Reported on 05/23/2019) 30 tablet 11 Not Taking at Unknown time   blood glucose meter kit and supplies KIT Dispense based on patient and insurance preference. Use up to four times daily as directed. (FOR ICD-9 250.00, 250.01). 1 each 0    cyclobenzaprine (FLEXERIL) 10 MG tablet TAKE 1 TABLET (10 MG TOTAL) BY MOUTH 2 (TWO) TIMES DAILY AS NEEDED FOR MUSCLE SPASMS. (Patient not taking: Reported on 05/23/2019) 60 tablet 0 Not Taking at Unknown time  glucose blood test strip Needs supply of TRUEMETRIC STRIPS Use as instructed 100 each 2    HYDROcodone-acetaminophen (NORCO/VICODIN) 5-325 MG tablet Take 1-2 tablets by mouth every 6 (six) hours as needed for moderate pain. (Patient not taking: Reported on 05/23/2019) 20 tablet 0 Not Taking at Unknown time   Insulin Pen Needle 32G X 5 MM MISC Use with levemir pen as prescribed 100 each 0    TRUEplus Lancets 28G MISC Use to check blood sugar daily. 100 each 11     Family History  Problem Relation Age of Onset   Diabetes Mother    Review of Systems:   ROS     Cardiac Review of Systems: Y or  [    ]= no  Chest Pain [ Y   ]  Resting SOB [ N  ] Exertional SOB  [N  ]  Orthopnea [  ]   Pedal Edema [   ]    Palpitations [  ] Syncope  [  ]   Presyncope [   ]  General Review of Systems: [Y] = yes [  ]=no Constitional: recent weight change [  ]; anorexia [  ]; fatigue [  ]; nausea Aqua.Slicker  ]; night sweats [  ]; fever [ Y ]; or chills [  ]                                                               Dental: Last Dentist visit:   Eye : blurred vision [  ]; diplopia [   ]; vision changes [  ];  Amaurosis fugax[  ]; Resp: cough Aqua.Slicker  ];  wheezing[  ];  hemoptysis[  ]; shortness of breath[  ]; paroxysmal nocturnal dyspnea[  ]; dyspnea on exertion[N ]; or orthopnea[  ];  GI:  gallstones[  ], vomiting[N ];  dysphagia[  ]; melena[  ];  hematochezia [  ]; heartburn[  ];   Hx of   Colonoscopy[  ]; GU: kidney stones [  ]; hematuria[  ];   dysuria [  ];  nocturia[  ];  history of     obstruction [  ]; urinary frequency [  ]             Skin:  swelling[ L chest ];, hair loss[  ];  peripheral edema[  ];  or itching[  ]; Musculosketetal: myalgias[Y  ];  joint swelling[  ];  joint erythema[  ];  joint pain[  ];  back pain[  ];  Heme/Lymph: bruising[  ];  bleeding[  ];  anemia[  ];  Neuro: TIA[  ];  headaches[  ];  stroke[  ];  vertigo[  ];  seizures[  ];   paresthesias[  ];  difficulty walking[  ];  Psych:depression[  ]; anxiety[  ];  Endocrine: diabetes[Y, uncontrolled  ];  thyroid dysfunction[  ];  Physical Exam: BP 123/84    Pulse 96    Temp 98.8 F (37.1 C) (Oral)    Resp 17    Ht _0  (1.803 m)    Wt 117.9 kg    SpO2 100%    BMI 36.26 kg/m   General appearance: alert, cooperative and no distress Head: Normocephalic, without obvious abnormality, atraumatic Resp: clear to auscultation bilaterally  Cardio: regular rate and rhythm GI: soft, non-tender; bowel sounds normal; no masses,  no organomegaly Extremities: left chest visibly swollen, indurated to palpation, 2 small openings, inferior with mild purulence noted Neurologic: Grossly normal  Diagnostic Studies & Laboratory data:     Recent Radiology Findings:   No results found.   I have independently reviewed the above radiologic studies and discussed with the patient   Recent Lab Findings: Lab Results  Component Value Date   WBC 7.0 05/26/2019   HGB 10.4 (L) 05/26/2019   HCT 32.8 (L) 05/26/2019   PLT 310 05/26/2019   GLUCOSE 240 (H) 05/26/2019   ALT 12 05/23/2019   AST 17 05/23/2019   NA 133 (L) 05/26/2019   K 3.5 05/26/2019   CL 104 05/26/2019   CREATININE 0.92 05/26/2019   BUN 8 05/26/2019   CO2 21 (L) 05/26/2019   TSH 3.625 12/17/2018   INR 1.2 11/22/2018   HGBA1C 11.4 (H) 11/25/2018    Assessment / Plan:      1. Left Pectoralis, [place] Joint Abscess- has had I/D back in April and June by  Dr. Redmond Baseman.. previous cultures grew Staph aureus 2. DM- poorly controlled 3. H/O A. Fib, in NSR today 4. Dispo- patient with left [place] Joint abscess, left pectorailis muscle abscess.... needs OR debridement with likely wound vac placement, patient may benefit from PICC line placement, wishes to speak with CSW in regards to disability.   I  spent 55 minutes counseling the patient face to face.   Ellwood Handler, PA-C  05/27/2019 10:46 AM    Agree with above.  Chest wall abscess, likely with [place] joint osteomyelitis Will plan for I&D, and surgical debridement on Monday.  Risks and benefits discussed.  Thera Basden [name]

## 2019-05-27 NOTE — Progress Notes (Signed)
Pharmacy Antibiotic Note  Taylor Hughes is a 48 y.o. male admitted on 05/23/2019 with sepsis.   PMH significant for diabetes, necrotizing fasciitis of neck requiring multiple I&D procedures.  Presents today with pain in left chest wall, induration & drainage also noted.  Pharmacy has been consulted for Vancomycin & Zosyn dosing.  Vancomycin levels demonstrate therapeutic AUC of 505. Abscess cultures growing MSSA - can likely transition to cefazolion.  Plan: Continue Zosyn 3.375g IV EI q8h Continue vancomycin 1250mg  IV q12h  Height: 5\' 11"  (180.3 cm) Weight: 260 lb (117.9 kg) IBW/kg (Calculated) : 75.3  Temp (24hrs), Avg:98.8 F (37.1 C), Min:98.2 F (36.8 C), Max:99.5 F (37.5 C)  Recent Labs  Lab 05/23/19 2133 05/23/19 2326 05/24/19 0505 05/25/19 0456 05/26/19 0431 05/27/19 1315 05/27/19 2117  WBC 8.5  --  9.8 8.1 7.0  --   --   CREATININE 1.02  --  0.84 1.03 0.92  --   --   LATICACIDVEN 2.2* 1.1  --   --   --   --   --   VANCOTROUGH  --   --   --   --   --   --  15  VANCOPEAK  --   --   --   --   --  27*  --     Estimated Creatinine Clearance: 129.6 mL/min (by C-G formula based on SCr of 0.92 mg/dL).    No Known Allergies  Antimicrobials this admission: 10/12 Clindamycin x1 10/13 Zosyn >>  10/13 Vanc >>   Dose adjustments this admission:  Microbiology results: 10/12 BCx: NGTD 10/14 Abscess: MSSA  Thank you for allowing pharmacy to be a part of this patient's care.  Arrie Senate, PharmD, BCPS Clinical Pharmacist [phone] Please check AMION for all [facility] numbers 05/27/2019

## 2019-05-27 NOTE — Plan of Care (Signed)
  Problem: Education: Goal: Knowledge of General Education information will improve Description Including pain rating scale, medication(s)/side effects and non-pharmacologic comfort measures Outcome: Progressing   Problem: Clinical Measurements: Goal: Will remain free from infection Outcome: Progressing   Problem: Elimination: Goal: Will not experience complications related to bowel motility Outcome: Progressing   Problem: Safety: Goal: Ability to remain free from injury will improve Outcome: Progressing   

## 2019-05-27 NOTE — Progress Notes (Signed)
PROGRESS NOTE  Taylor Hughes [id number] DOB: 1970-08-21 DOA: 05/23/2019 PCP: Charlott Rakes, MD  HPI/Recap of past 24 hours: 48 yo male whopresented with fever and left anterior chest wall pain. 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 Patient reported fevers and chest pain for about 2 days. No history of local trauma. On his initial physical examination he was febrile 100.5 F, blood pressure 110/75, heart rate 108, respiratory 24, oxygen saturation 93%, his lungs are clear to auscultation bilaterally, heart S1-S2 present rhythmic, abdomen soft, no lower extremity edema. Tenderness in the left anterior chest wall with positive local induration. 2 opening wounds in the mid upper anterior thorax. Sodium134, potassium 3.6, chloride 98, bicarb 23,glucose 128, BUN 16, creatinine 1.0, lactic acid 2.2, white cell count 8.5, hemoglobin 11.7, hematocrit 35.7, platelets 350. SARSCOVID-19 was negative.Urine analysis specific gravity more than 1.046, 30 protein,glucose 150.CT chest with old hematoma/infected fluid left pectoralis major,collection extending superiorly and medially to the left sternoclavicular joint.  Patient was admitted to the hospital working diagnosis of sepsis due to left pectoralis muscle cellulitis/abscess.  Patient tolerating weill IV antibiotic therapy with Vancomycin and Zosyn, he has remained afebrile with no leukocytosis. Persistent hard collection on his left anterior chest wall.  Transferred to [facility] from [facility] for debridement.   CTS made aware of the patient's arrival at [facility] and will see in consultation.  05/27/19: Patient was seen and examined at his bedside this morning.  No acute events overnight.  His pain is well controlled.  He denies any chest pain or dyspnea at rest.  Contacted CTS, will see in consultation for possible  debridement.  Assessment/Plan: Active Problems:   Essential hypertension   Type 2 diabetes mellitus with other specified complication (HCC)   Sepsis ([place])  Sepsis secondary to left pectoralis muscle cellulitis/abscess  Presented with fever, tachycardia and tachypnea CT chest with contrast done on 05/31/2019 showed: There is a 5.6 x 2.8 cm ill-defined low attenuating collection in the left pectoralis major which may represent an old hematoma or infected fluid. This appears to extend more superiorly and medially to the left sternoclavicular joint. There is irregularity and fragmentation of the left sternoclavicular joint which may represent an infectious process. Initially was seen by general surgery, signed off on 05/26/2019 Transferred to [facility] on 05/26/2019 for possible debridement by CTS. CTS contacted and will see in consultation Continue broad-spectrum antibiotics IV vancomycin and IV Zosyn empirically. Sepsis physiology is resolving on IV antibiotics Blood cultures x2 drawn peripherally taken on 05/31/2019- x4 days Aerobic culture taken from superficial specimen, wound abscess taken on 05/24/2019: Rare Staphylococcus aureus and rare group A strep. Obtain MRSA screening Maximize pain control  Insulin-dependent type 2 diabetes with hyperglycemia Hemoglobin A1c 11.4 on 11/25/2018 Repeat A1c Continue insulin coverage Avoid hyperglycemia  Chronic diastolic CHF Last 2D echo done on 07/30/2018 showed LVEF 65 to 70% Start strict I's and O's and daily weight Monitor volume status Continue metoprolol  Chronic normocytic anemia Hemoglobin appears to be at his baseline 10.4 with MCV of 91 No sign of overt bleeding Continue to monitor H&H  Obesity BMI 36 Recommend weight loss outpatient with regular physical activity and healthy dieting  Essential hypertension Blood pressure is at goal Continue metoprolol at home dose Continue to monitor vital signs  DVT prophylaxis:SCDs,  holding off Lovenox subcu daily due to possible surgical procedure.  Defer chemical DVT prophylaxis  to CTS when appropriate. Code Status:full Family Communication:no family at the bedside Disposition Plan: Patient is currently not appropriate for discharge at this time due to ongoing treatment for pectoralis abscess, possibly requiring debridement by cardiothoracic surgery.  Will discharge to home when CTS signs off and patient is hemodynamically stable.  Body mass index is 36.26 kg/m. Malnutrition Type:    Malnutrition Characteristics:    Nutrition Interventions:   RN Pressure Injury Documentation:   Consultants:   Surgery   CT surgery   Procedures:     Antimicrobials:    Vancomycin and Zosyn.    Objective: Vitals:   05/26/19 1341 05/26/19 2100 05/27/19 0409 05/27/19 0806  BP: 125/88 (!) 145/92 127/79 123/84  Pulse: 88 99 96 96  Resp: 16 16 18 17   Temp: 97.9 F (36.6 C) 97.9 F (36.6 C) 98.2 F (36.8 C) 98.8 F (37.1 C)  TempSrc: Oral Oral Oral Oral  SpO2: 97% 100% 100% 100%  Weight:      Height:        Intake/Output Summary (Last 24 hours) at 05/27/2019 1006 Last data filed at 05/27/2019 0000 Gross per 24 hour  Intake 980 ml  Output 800 ml  Net 180 ml   Filed Weights   05/23/19 2026  Weight: 117.9 kg    Exam:  . General: 48 y.o. year-old male well developed well nourished in no acute distress.  Alert and oriented x3. . Cardiovascular: Regular rate and rhythm with no rubs or gallops.  No thyromegaly or JVD noted.  Erythema and swelling noted on left upper chest.  2 healed medial lesions in the upper chest. . Respiratory: Clear to auscultation with no wheezes or rales. Good inspiratory effort. . Abdomen: Soft nontender nondistended with normal bowel sounds x4 quadrants. . Musculoskeletal: 2+ pitting edema in lower extremity bilaterally. 2/4 pulses in all 4 extremities. Marland Kitchen Psychiatry: Mood is appropriate for condition and  setting   Data Reviewed: CBC: Recent Labs  Lab 05/23/19 2133 05/24/19 0505 05/25/19 0456 05/26/19 0431  WBC 8.5 9.8 8.1 7.0  NEUTROABS 6.8  --  5.7 4.8  HGB 11.7* 10.2* 10.4* 10.4*  HCT 35.7* 31.1* 32.4* 32.8*  MCV 88.4 90.1 91.0 91.9  PLT 350 296 307 [id number]   Basic Metabolic Panel: Recent Labs  Lab 05/23/19 2133 05/24/19 0505 05/25/19 0456 05/26/19 0431  NA 134* 133* 137 133*  K 3.6 3.4* 3.5 3.5  CL 98 101 104 104  CO2 23 20* 22 21*  GLUCOSE 128* 187* 187* 240*  BUN 16 12 12 8   CREATININE 1.02 0.84 1.03 0.92  CALCIUM 8.7* 7.9* 8.0* 8.0*   GFR: Estimated Creatinine Clearance: 129.6 mL/min (by C-G formula based on SCr of 0.92 mg/dL). Liver Function Tests: Recent Labs  Lab 05/23/19 2133  AST 17  ALT 12  ALKPHOS 79  BILITOT 0.3  PROT 7.9  ALBUMIN 2.7*   No results for input(s): LIPASE, AMYLASE in the last 168 hours. No results for input(s): AMMONIA in the last 168 hours. Coagulation Profile: No results for input(s): INR, PROTIME in the last 168 hours. Cardiac Enzymes: No results for input(s): CKTOTAL, CKMB, CKMBINDEX, TROPONINI in the last 168 hours. BNP (last 3 results) No results for input(s): PROBNP in the last 8760 hours. HbA1C: No results for input(s): HGBA1C in the last 72 hours. CBG: Recent Labs  Lab 05/26/19 1627 05/26/19 2055 05/26/19 2359 05/27/19 0405 05/27/19 0755  GLUCAP 319* 247* 163* 206* 210*   Lipid Profile: No results for input(s): CHOL,  HDL, LDLCALC, TRIG, CHOLHDL, LDLDIRECT in the last 72 hours. Thyroid Function Tests: No results for input(s): TSH, T4TOTAL, FREET4, T3FREE, THYROIDAB in the last 72 hours. Anemia Panel: No results for input(s): VITAMINB12, FOLATE, FERRITIN, TIBC, IRON, RETICCTPCT in the last 72 hours. Urine analysis:    Component Value Date/Time   COLORURINE YELLOW 05/24/2019 0106   APPEARANCEUR HAZY (A) 05/24/2019 0106   LABSPEC >1.046 (H) 05/24/2019 0106   PHURINE 5.0 05/24/2019 0106   GLUCOSEU 150 (A)  05/24/2019 0106   HGBUR SMALL (A) 05/24/2019 0106   BILIRUBINUR NEGATIVE 05/24/2019 0106   KETONESUR 20 (A) 05/24/2019 0106   PROTEINUR 30 (A) 05/24/2019 0106   NITRITE NEGATIVE 05/24/2019 0106   LEUKOCYTESUR NEGATIVE 05/24/2019 0106   Sepsis Labs: @LABRCNTIP (procalcitonin:4,lacticidven:4)  ) Recent Results (from the past 240 hour(s))  Culture, blood (routine x 2)     Status: None (Preliminary result)   Collection Time: 05/23/19  9:33 PM   Specimen: BLOOD  Result Value Ref Range Status   Specimen Description   Final    BLOOD BLOOD LEFT HAND Performed at [facility], [place] [address] [place], [place] [address]    Special Requests   Final    BOTTLES DRAWN AEROBIC AND ANAEROBIC Blood Culture adequate volume Performed at [place] [address], [place], [place] [address]    Culture   Final    NO GROWTH 4 DAYS Performed at [facility], [place] [address], [place], [place] [address]    Report Status PENDING  Incomplete  Culture, blood (routine x 2)     Status: None (Preliminary result)   Collection Time: 05/23/19  9:33 PM   Specimen: BLOOD  Result Value Ref Range Status   Specimen Description   Final    BLOOD BLOOD RIGHT FOREARM Performed at [place] [address], [place], [place] [address]    Special Requests   Final    BOTTLES DRAWN AEROBIC ONLY Blood Culture results may not be optimal due to an inadequate volume of blood received in culture bottles Performed at [place] [address] [place], [place] [address]    Culture   Final    NO GROWTH 4 DAYS Performed at [facility], [place] [address], [place], [place] [address]    Report Status PENDING  Incomplete  SARS Coronavirus 2 by RT PCR (hospital order, performed in [facility] hospital lab) Nasopharyngeal Nasopharyngeal Swab     Status: None   Collection Time: 05/24/19 12:26 AM   Specimen: Nasopharyngeal Swab  Result  Value Ref Range Status   SARS Coronavirus 2 NEGATIVE NEGATIVE Final    Comment: (NOTE) If result is NEGATIVE SARS-CoV-2 target nucleic acids are NOT DETECTED. The SARS-CoV-2 RNA is generally detectable in upper and lower  respiratory specimens during the acute phase of infection. The lowest  concentration of SARS-CoV-2 viral copies this assay can detect is 250  copies / mL. A negative result does not preclude SARS-CoV-2 infection  and should not be used as the sole basis for treatment or other  patient management decisions.  A negative result may occur with  improper specimen collection / handling, submission of specimen other  than nasopharyngeal swab, presence of viral mutation(s) within the  areas targeted by this assay, and inadequate number of viral copies  (<250 copies / mL). A negative result must be combined with clinical  observations, patient history, and epidemiological information. If result is POSITIVE SARS-CoV-2  target nucleic acids are DETECTED. The SARS-CoV-2 RNA is generally detectable in upper and lower  respiratory specimens dur ing the acute phase of infection.  Positive  results are indicative of active infection with SARS-CoV-2.  Clinical  correlation with patient history and other diagnostic information is  necessary to determine patient infection status.  Positive results do  not rule out bacterial infection or co-infection with other viruses. If result is PRESUMPTIVE POSTIVE SARS-CoV-2 nucleic acids MAY BE PRESENT.   A presumptive positive result was obtained on the submitted specimen  and confirmed on repeat testing.  While 2019 novel coronavirus  (SARS-CoV-2) nucleic acids may be present in the submitted sample  additional confirmatory testing may be necessary for epidemiological  and / or clinical management purposes  to differentiate between  SARS-CoV-2 and other Sarbecovirus currently known to infect humans.  If clinically indicated additional testing  with an alternate test  methodology [phone]) is advised. The SARS-CoV-2 RNA is generally  detectable in upper and lower respiratory sp ecimens during the acute  phase of infection. The expected result is Negative. Fact Sheet for Patients:  StrictlyIdeas.no Fact Sheet for Healthcare Providers: BankingDealers.co.za This test is not yet approved or cleared by the [place] FDA and has been authorized for detection and/or diagnosis of SARS-CoV-2 by FDA under an Emergency Use Authorization (EUA).  This EUA will remain in effect (meaning this test can be used) for the duration of the COVID-19 declaration under Section 564(b)(1) of the Act, 21 U.S.C. section 360bbb-3(b)(1), unless the authorization is terminated or revoked sooner. Performed at [facility], [place] [address] [place], [place] [address]   Aerobic Culture (superficial specimen)     Status: None (Preliminary result)   Collection Time: 05/24/19  8:05 AM   Specimen: Abscess; Wound  Result Value Ref Range Status   Specimen Description   Final    ABSCESS CHEST Performed at [place] [address], [place], [place] [address]    Special Requests   Final    NONE Performed at [facility], [place] [address], [place], [place] [address]    Gram Stain   Final    FEW WBC PRESENT, PREDOMINANTLY PMN FEW GRAM POSITIVE COCCI IN CLUSTERS Performed at [facility], [place] [address] [place], [place] [address]    Culture   Final    RARE STAPHYLOCOCCUS AUREUS RARE GROUP A STREP (S.PYOGENES) ISOLATED    Report Status PENDING  Incomplete      Studies: No results found.  Scheduled Meds: . insulin aspart  0-9 Units Subcutaneous Q4H  . insulin detemir  20 Units Subcutaneous Daily  . metoprolol tartrate  50 mg Oral BID    Continuous Infusions: . piperacillin-tazobactam (ZOSYN)  IV 3.375 g (05/27/19 0049)  . vancomycin  1,250 mg (05/26/19 2123)     LOS: 3 days     Kayleen Memos, MD [facility] Pager [phone]  If 7PM-7AM, please contact night-coverage [email or web address] Password [email or web address] 05/27/2019, 10:06 AM

## 2019-05-28 LAB — CREATININE, SERUM
Creatinine, Ser: 1.09 mg/dL (ref 0.61–1.24)
GFR calc Af Amer: >60 mL/min (ref >60)
GFR calc non Af Amer: >60 mL/min (ref >60)

## 2019-05-28 LAB — GLUCOSE, CAPILLARY
Glucose-Capillary: 220 mg/dL — ABNORMAL HIGH (ref 70–99)
Glucose-Capillary: 139 mg/dL — ABNORMAL HIGH (ref 70–99)
Glucose-Capillary: 68 mg/dL — ABNORMAL LOW (ref 70–99)
Glucose-Capillary: 67 mg/dL — ABNORMAL LOW (ref 70–99)
Glucose-Capillary: 198 mg/dL — ABNORMAL HIGH (ref 70–99)

## 2019-05-28 LAB — AEROBIC CULTURE W GRAM STAIN (SUPERFICIAL SPECIMEN)

## 2019-05-28 LAB — CULTURE, BLOOD (ROUTINE X 2)
Culture: NO GROWTH
Culture: NO GROWTH
Special Requests: ADEQUATE

## 2019-05-28 MED ORDER — CEFAZOLIN SODIUM-DEXTROSE 2-4 GM/100ML-% IV SOLN
2.0000 g | Freq: Three times a day (TID) | INTRAVENOUS | Status: DC
  Administered 2019-05-28 – 2019-06-07 (×31): 2 g via INTRAVENOUS
  Filled 2019-05-28 (×32): qty 100

## 2019-05-28 NOTE — Progress Notes (Signed)
PROGRESS NOTE  Taylor Hughes [id number] DOB: [birth year] DOA: 05/23/2019 PCP: Charlott Rakes, MD  HPI/Recap of past 24 hours: 48 yo male whopresented with fever and left anterior chest wall pain. 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 Patient reported fevers and chest pain for about 2 days. No history of local trauma. On his initial physical examination he was febrile 100.5 F, blood pressure 110/75, heart rate 108, respiratory 24, oxygen saturation 93%, his lungs are clear to auscultation bilaterally, heart S1-S2 present rhythmic, abdomen soft, no lower extremity edema. Tenderness in the left anterior chest wall with positive local induration. 2 opening wounds in the mid upper anterior thorax. Sodium134, potassium 3.6, chloride 98, bicarb 23,glucose 128, BUN 16, creatinine 1.0, lactic acid 2.2, white cell count 8.5, hemoglobin 11.7, hematocrit 35.7, platelets 350. SARSCOVID-19 was negative.Urine analysis specific gravity more than 1.046, 30 protein,glucose 150.CT chest with old hematoma/infected fluid left pectoralis major,collection extending superiorly and medially to the left sternoclavicular joint.  Patient was admitted to the hospital working diagnosis of sepsis due to left pectoralis muscle cellulitis/abscess.  Patient tolerating weill IV antibiotic therapy with Vancomycin and Zosyn, he has remained afebrile with no leukocytosis. Persistent hard collection on his left anterior chest wall.  Transferred to [facility] from [facility] for debridement.   CTS made aware of the patient's arrival at [facility] and will see in consultation.  05/27/19: Patient was seen and examined at his bedside this morning.  No acute events overnight.  His pain is well controlled.  He denies any chest pain or dyspnea at rest.  Contacted CTS, will see in consultation for possible  debridement.  05/28/19: Patient was seen and examined at his bedside this morning.  No acute events overnight.  No new complaints.  Pain management and antibiotics coverage in place.  Seen by CTS with plan for I&D and surgical debridement on Monday, 05/30/2019.  Will be n.p.o. after midnight.  Assessment/Plan: Active Problems:   Essential hypertension   Type 2 diabetes mellitus with other specified complication (HCC)   Sepsis ([place])  Sepsis secondary to left pectoralis muscle cellulitis/abscess  Presented with fever, tachycardia and tachypnea CT chest with contrast done on 05/31/2019 showed: There is a 5.6 x 2.8 cm ill-defined low attenuating collection in the left pectoralis major which may represent an old hematoma or infected fluid. This appears to extend more superiorly and medially to the left sternoclavicular joint. There is irregularity and fragmentation of the left sternoclavicular joint which may represent an infectious process. Initially was seen by general surgery, signed off on 05/26/2019 Transferred to [facility] on 05/26/2019 for possible debridement by CTS. CTS contacted and will see in consultation Continue broad-spectrum antibiotics IV vancomycin and IV Zosyn empirically. Sepsis physiology is resolving on IV antibiotics, T-max 99.4 Blood cultures x2 drawn peripherally taken on 05/31/2019- x4 days Aerobic culture taken from superficial specimen, wound abscess taken on 05/24/2019: Rare Staphylococcus aureus and rare group A strep.  Pansensitive. MRSA screening negative. Continue to maximize pain control Plan for I&D and surgical debridement on Monday, 05/30/2019 by CTS.  Insulin-dependent type 2 diabetes with hyperglycemia Hemoglobin A1c 11.4 on 11/25/2018 Hemoglobin A1c 12.7 on 05/27/2019. Continue insulin coverage Avoid hyperglycemia  Chronic diastolic CHF Last 2D echo done on 07/30/2018 showed LVEF 65 to 70% Start strict I's and O's and daily weight Monitor volume  status Continue metoprolol  Chronic normocytic anemia Hemoglobin appears to  be at his baseline 10.4 with MCV of 91 No sign of overt bleeding Continue to monitor H&H  Obesity BMI 36 Recommend weight loss outpatient with regular physical activity and healthy dieting  Essential hypertension Blood pressure is currently at goal. Continue metoprolol at home dose Continue to monitor vital signs  DVT prophylaxis:SCDs, holding off Lovenox subcu daily due to possible surgical procedure.  Defer chemical DVT prophylaxis to CTS when appropriate. Code Status:full Family Communication:no family at the bedside Disposition Plan: Patient is currently not appropriate for discharge at this time due to ongoing treatment for pectoralis abscess, possibly requiring debridement by cardiothoracic surgery.  Will discharge to home when CTS signs off and patient is hemodynamically stable.  Body mass index is 36.26 kg/m. Malnutrition Type:    Malnutrition Characteristics:    Nutrition Interventions:   RN Pressure Injury Documentation:   Consultants:   Surgery   CT surgery   Procedures:     Antimicrobials:    Vancomycin and Zosyn.    Objective: Vitals:   05/27/19 1957 05/28/19 0409 05/28/19 0417 05/28/19 0756  BP: 137/87 131/73  128/77  Pulse: (!) 101 81  99  Resp: 18 18  16   Temp: 99.5 F (37.5 C) 97.8 F (36.6 C)  99.4 F (37.4 C)  TempSrc: Oral Oral  Oral  SpO2: 100% 99%  96%  Weight:   117 kg   Height:        Intake/Output Summary (Last 24 hours) at 05/28/2019 1114 Last data filed at 05/28/2019 0500 Gross per 24 hour  Intake 2139.05 ml  Output 2450 ml  Net -310.95 ml   Filed Weights   05/23/19 2026 05/28/19 0417  Weight: 117.9 kg 117 kg    Exam:   General: 48 y.o. year-old male well-developed well-nourished no acute distress.  Alert and oriented x4.    Cardiovascular: Regular rate and rhythm no rubs or gallops no JVD or thyromegaly.   Erythema and swelling noted on left upper chest.  2 healed medial lesions in the upper chest.  Respiratory: Clear to auscultation no wheezes no rales.  Good inspiratory effort.    Abdomen: Soft nontender nondistended normal bowel sounds present.    Musculoskeletal: 2+ pitting edema lower extremities bilaterally 2 out of 4 pulses in all 4 extremities.    Psychiatry: Mood is appropriate for condition and setting.   Data Reviewed: CBC: Recent Labs  Lab 05/23/19 2133 05/24/19 0505 05/25/19 0456 05/26/19 0431  WBC 8.5 9.8 8.1 7.0  NEUTROABS 6.8  --  5.7 4.8  HGB 11.7* 10.2* 10.4* 10.4*  HCT 35.7* 31.1* 32.4* 32.8*  MCV 88.4 90.1 91.0 91.9  PLT 350 296 307 [id number]   Basic Metabolic Panel: Recent Labs  Lab 05/23/19 2133 05/24/19 0505 05/25/19 0456 05/26/19 0431 05/28/19 0239  NA 134* 133* 137 133*  --   K 3.6 3.4* 3.5 3.5  --   CL 98 101 104 104  --   CO2 23 20* 22 21*  --   GLUCOSE 128* 187* 187* 240*  --   BUN 16 12 12 8   --   CREATININE 1.02 0.84 1.03 0.92 1.09  CALCIUM 8.7* 7.9* 8.0* 8.0*  --    GFR: Estimated Creatinine Clearance: 109 mL/min (by C-G formula based on SCr of 1.09 mg/dL). Liver Function Tests: Recent Labs  Lab 05/23/19 2133  AST 17  ALT 12  ALKPHOS 79  BILITOT 0.3  PROT 7.9  ALBUMIN 2.7*   No results for input(s): LIPASE, AMYLASE in  the last 168 hours. No results for input(s): AMMONIA in the last 168 hours. Coagulation Profile: No results for input(s): INR, PROTIME in the last 168 hours. Cardiac Enzymes: No results for input(s): CKTOTAL, CKMB, CKMBINDEX, TROPONINI in the last 168 hours. BNP (last 3 results) No results for input(s): PROBNP in the last 8760 hours. HbA1C: Recent Labs    05/27/19 1212  HGBA1C 12.7*   CBG: Recent Labs  Lab 05/27/19 0755 05/27/19 1144 05/27/19 1615 05/27/19 2020 05/28/19 0710  GLUCAP 210* 269* 128* 110* 220*   Lipid Profile: No results for input(s): CHOL, HDL, LDLCALC, TRIG, CHOLHDL, LDLDIRECT in the  last 72 hours. Thyroid Function Tests: No results for input(s): TSH, T4TOTAL, FREET4, T3FREE, THYROIDAB in the last 72 hours. Anemia Panel: No results for input(s): VITAMINB12, FOLATE, FERRITIN, TIBC, IRON, RETICCTPCT in the last 72 hours. Urine analysis:    Component Value Date/Time   COLORURINE YELLOW 05/24/2019 0106   APPEARANCEUR HAZY (A) 05/24/2019 0106   LABSPEC >1.046 (H) 05/24/2019 0106   PHURINE 5.0 05/24/2019 0106   GLUCOSEU 150 (A) 05/24/2019 0106   HGBUR SMALL (A) 05/24/2019 0106   BILIRUBINUR NEGATIVE 05/24/2019 0106   KETONESUR 20 (A) 05/24/2019 0106   PROTEINUR 30 (A) 05/24/2019 0106   NITRITE NEGATIVE 05/24/2019 0106   LEUKOCYTESUR NEGATIVE 05/24/2019 0106   Sepsis Labs: @LABRCNTIP (procalcitonin:4,lacticidven:4)  ) Recent Results (from the past 240 hour(s))  Culture, blood (routine x 2)     Status: None   Collection Time: 05/23/19  9:33 PM   Specimen: BLOOD  Result Value Ref Range Status   Specimen Description   Final    BLOOD BLOOD LEFT HAND Performed at [facility], [place] [address], [place], [place] [address]    Special Requests   Final    BOTTLES DRAWN AEROBIC AND ANAEROBIC Blood Culture adequate volume Performed at [facility] [address], [place], [place] [address]    Culture   Final    NO GROWTH 5 DAYS Performed at [facility], [place] [address], [place], [place] [address]    Report Status 05/28/2019 FINAL  Final  Culture, blood (routine x 2)     Status: None   Collection Time: 05/23/19  9:33 PM   Specimen: BLOOD  Result Value Ref Range Status   Specimen Description   Final    BLOOD BLOOD RIGHT FOREARM Performed at [facility] [facility] [address] [place], [place] [address]    Special Requests   Final    BOTTLES DRAWN AEROBIC ONLY Blood Culture results may not be optimal due to an inadequate volume of blood received in culture bottles Performed at [facility] [address], [place], [place] [address]    Culture   Final    NO GROWTH 5 DAYS Performed at [facility], [place] [address], [place], [place] [address]    Report Status 05/28/2019 FINAL  Final  SARS Coronavirus 2 by RT PCR (hospital order, performed in [facility] hospital lab) Nasopharyngeal Nasopharyngeal Swab     Status: None   Collection Time: 05/24/19 12:26 AM   Specimen: Nasopharyngeal Swab  Result Value Ref Range Status   SARS Coronavirus 2 NEGATIVE NEGATIVE Final    Comment: (NOTE) If result is NEGATIVE SARS-CoV-2 target nucleic acids are NOT DETECTED. The SARS-CoV-2 RNA is generally detectable in upper and lower  respiratory specimens during the acute phase of infection. The lowest  concentration of SARS-CoV-2 viral copies this assay  can detect is 250  copies / mL. A negative result does not preclude SARS-CoV-2 infection  and should not be used as the sole basis for treatment or other  patient management decisions.  A negative result may occur with  improper specimen collection / handling, submission of specimen other  than nasopharyngeal swab, presence of viral mutation(s) within the  areas targeted by this assay, and inadequate number of viral copies  (<250 copies / mL). A negative result must be combined with clinical  observations, patient history, and epidemiological information. If result is POSITIVE SARS-CoV-2 target nucleic acids are DETECTED. The SARS-CoV-2 RNA is generally detectable in upper and lower  respiratory specimens dur ing the acute phase of infection.  Positive  results are indicative of active infection with SARS-CoV-2.  Clinical  correlation with patient history and other diagnostic information is  necessary to determine patient infection status.  Positive results do  not rule out bacterial infection or co-infection with other viruses. If result is PRESUMPTIVE POSTIVE SARS-CoV-2 nucleic acids MAY BE PRESENT.   A presumptive positive result  was obtained on the submitted specimen  and confirmed on repeat testing.  While 2019 novel coronavirus  (SARS-CoV-2) nucleic acids may be present in the submitted sample  additional confirmatory testing may be necessary for epidemiological  and / or clinical management purposes  to differentiate between  SARS-CoV-2 and other Sarbecovirus currently known to infect humans.  If clinically indicated additional testing with an alternate test  methodology [phone]) is advised. The SARS-CoV-2 RNA is generally  detectable in upper and lower respiratory sp ecimens during the acute  phase of infection. The expected result is Negative. Fact Sheet for Patients:  StrictlyIdeas.no Fact Sheet for Healthcare Providers: BankingDealers.co.za This test is not yet approved or cleared by the [place] FDA and has been authorized for detection and/or diagnosis of SARS-CoV-2 by FDA under an Emergency Use Authorization (EUA).  This EUA will remain in effect (meaning this test can be used) for the duration of the COVID-19 declaration under Section 564(b)(1) of the Act, 21 U.S.C. section 360bbb-3(b)(1), unless the authorization is terminated or revoked sooner. Performed at [facility], [place] [address] [place], [place] [address]   Aerobic Culture (superficial specimen)     Status: None (Preliminary result)   Collection Time: 05/24/19  8:05 AM   Specimen: Abscess; Wound  Result Value Ref Range Status   Specimen Description ABSCESS CHEST  Final   Special Requests NONE  Final   Gram Stain   Final    FEW WBC PRESENT, PREDOMINANTLY PMN FEW GRAM POSITIVE COCCI IN CLUSTERS    Culture   Final    RARE STAPHYLOCOCCUS AUREUS RARE GROUP A STREP (S.PYOGENES) ISOLATED    Report Status PENDING  Incomplete   Organism ID, Bacteria STAPHYLOCOCCUS AUREUS  Final      Susceptibility   Staphylococcus aureus - MIC*    CIPROFLOXACIN <=0.5 SENSITIVE  Sensitive     ERYTHROMYCIN <=0.25 SENSITIVE Sensitive     GENTAMICIN <=0.5 SENSITIVE Sensitive     OXACILLIN 0.5 SENSITIVE Sensitive     TETRACYCLINE <=1 SENSITIVE Sensitive     VANCOMYCIN <=0.5 SENSITIVE Sensitive     TRIMETH/SULFA <=10 SENSITIVE Sensitive     CLINDAMYCIN <=0.25 SENSITIVE Sensitive     RIFAMPIN <=0.5 SENSITIVE Sensitive     Inducible Clindamycin Value in next row Sensitive      NEGATIVEPerformed at [facility] [address] [place], [place] [address]    *  RARE STAPHYLOCOCCUS AUREUS  MRSA PCR Screening     Status: None   Collection Time: 05/27/19  1:00 PM   Specimen: Nasal Mucosa; Nasopharyngeal  Result Value Ref Range Status   MRSA by PCR NEGATIVE NEGATIVE Final    Comment:        The GeneXpert MRSA Assay (FDA approved for NASAL specimens only), is one component of a comprehensive MRSA colonization surveillance program. It is not intended to diagnose MRSA infection nor to guide or monitor treatment for MRSA infections. Performed at [facility], [place] [address], [place], [place] [address]       Studies: No results found.  Scheduled Meds:  insulin aspart  0-20 Units Subcutaneous TID WC   insulin aspart  0-5 Units Subcutaneous QHS   insulin detemir  20 Units Subcutaneous Daily   metoprolol tartrate  50 mg Oral BID   senna-docusate  2 tablet Oral BID    Continuous Infusions:  piperacillin-tazobactam (ZOSYN)  IV 3.375 g (05/28/19 0920)   vancomycin 1,250 mg (05/27/19 2343)     LOS: 4 days     Kayleen Memos, MD [facility] Pager [phone]  If 7PM-7AM, please contact night-coverage [email or web address] Password [email or web address] 05/28/2019, 11:14 AM

## 2019-05-28 NOTE — Plan of Care (Signed)
  Problem: Education: Goal: Knowledge of General Education information will improve Description: Including pain rating scale, medication(s)/side effects and non-pharmacologic comfort measures Outcome: Progressing   Problem: Clinical Measurements: Goal: Will remain free from infection Outcome: Progressing   Problem: Activity: Goal: Risk for activity intolerance will decrease Outcome: Progressing   

## 2019-05-29 LAB — CBC WITH DIFFERENTIAL/PLATELET
Abs Immature Granulocytes: 0.02 10*3/uL (ref 0.00–0.07)
Basophils Absolute: 0 10*3/uL (ref 0.0–0.1)
Basophils Relative: 0 %
Eosinophils Absolute: 0 10*3/uL (ref 0.0–0.5)
Eosinophils Relative: 1 %
HCT: 30.6 % — ABNORMAL LOW (ref 39.0–52.0)
Hemoglobin: 10 g/dL — ABNORMAL LOW (ref 13.0–17.0)
Immature Granulocytes: 0 %
Lymphocytes Relative: 12 %
Lymphs Abs: 0.9 10*3/uL (ref 0.7–4.0)
MCH: 29.1 pg (ref 26.0–34.0)
MCHC: 32.7 g/dL (ref 30.0–36.0)
MCV: 89 fL (ref 80.0–100.0)
Monocytes Absolute: 0.8 10*3/uL (ref 0.1–1.0)
Monocytes Relative: 12 %
Neutro Abs: 5.4 10*3/uL (ref 1.7–7.7)
Neutrophils Relative %: 75 %
Platelets: 365 10*3/uL (ref 150–400)
RBC: 3.44 MIL/uL — ABNORMAL LOW (ref 4.22–5.81)
RDW: 14.8 % (ref 11.5–15.5)
WBC: 7.1 10*3/uL (ref 4.0–10.5)
nRBC: 0 % (ref 0.0–0.2)

## 2019-05-29 LAB — COMPREHENSIVE METABOLIC PANEL
ALT: 8 U/L (ref 0–44)
AST: 16 U/L (ref 15–41)
Albumin: 2 g/dL — ABNORMAL LOW (ref 3.5–5.0)
Alkaline Phosphatase: 65 U/L (ref 38–126)
Anion gap: 10 (ref 5–15)
BUN: 8 mg/dL (ref 6–20)
CO2: 22 mmol/L (ref 22–32)
Calcium: 8.2 mg/dL — ABNORMAL LOW (ref 8.9–10.3)
Chloride: 106 mmol/L (ref 98–111)
Creatinine, Ser: 2.42 mg/dL — ABNORMAL HIGH (ref 0.61–1.24)
GFR calc Af Amer: 36 mL/min — ABNORMAL LOW (ref >60)
GFR calc non Af Amer: 31 mL/min — ABNORMAL LOW (ref >60)
Glucose, Bld: 171 mg/dL — ABNORMAL HIGH (ref 70–99)
Potassium: 3.8 mmol/L (ref 3.5–5.1)
Sodium: 138 mmol/L (ref 135–145)
Total Bilirubin: 0.6 mg/dL (ref 0.3–1.2)
Total Protein: 7.2 g/dL (ref 6.5–8.1)

## 2019-05-29 LAB — GLUCOSE, CAPILLARY
Glucose-Capillary: 212 mg/dL — ABNORMAL HIGH (ref 70–99)
Glucose-Capillary: 239 mg/dL — ABNORMAL HIGH (ref 70–99)
Glucose-Capillary: 276 mg/dL — ABNORMAL HIGH (ref 70–99)
Glucose-Capillary: 174 mg/dL — ABNORMAL HIGH (ref 70–99)

## 2019-05-29 LAB — MAGNESIUM: Magnesium: 1.8 mg/dL (ref 1.7–2.4)

## 2019-05-29 LAB — PHOSPHORUS: Phosphorus: 3.5 mg/dL (ref 2.5–4.6)

## 2019-05-29 MED ORDER — LACTATED RINGERS IV SOLN
INTRAVENOUS | Status: AC
  Administered 2019-05-29: 12:00:00 via INTRAVENOUS

## 2019-05-29 MED ORDER — LOPERAMIDE HCL 2 MG PO CAPS
2.0000 mg | ORAL_CAPSULE | ORAL | Status: DC | PRN
  Administered 2019-05-29 – 2019-06-01 (×4): 2 mg via ORAL
  Filled 2019-05-29 (×4): qty 1

## 2019-05-29 MED ORDER — MAGNESIUM SULFATE 2 GM/50ML IV SOLN
2.0000 g | Freq: Once | INTRAVENOUS | Status: AC
  Administered 2019-05-29: 13:00:00 2 g via INTRAVENOUS
  Filled 2019-05-29: qty 50

## 2019-05-29 MED ORDER — ALPRAZOLAM 0.25 MG PO TABS
0.2500 mg | ORAL_TABLET | Freq: Three times a day (TID) | ORAL | Status: AC | PRN
  Administered 2019-05-29 – 2019-05-30 (×3): 0.25 mg via ORAL
  Filled 2019-05-29 (×3): qty 1

## 2019-05-29 NOTE — Progress Notes (Addendum)
PROGRESS NOTE  Taylor Hughes [id number] DOB: 05-26-1971 DOA: 05/23/2019 PCP: Charlott Rakes, MD  HPI/Recap of past 24 hours: 48 yo male whopresented with fever and left anterior chest wall pain. 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 Patient reported fevers and chest pain for about 2 days. No history of local trauma. On his initial physical examination he was febrile 100.5 F, blood pressure 110/75, heart rate 108, respiratory 24, oxygen saturation 93%, his lungs are clear to auscultation bilaterally, heart S1-S2 present rhythmic, abdomen soft, no lower extremity edema. Tenderness in the left anterior chest wall with positive local induration. 2 opening wounds in the mid upper anterior thorax. Sodium134, potassium 3.6, chloride 98, bicarb 23,glucose 128, BUN 16, creatinine 1.0, lactic acid 2.2, white cell count 8.5, hemoglobin 11.7, hematocrit 35.7, platelets 350. SARSCOVID-19 was negative.Urine analysis specific gravity more than 1.046, 30 protein,glucose 150.CT chest with old hematoma/infected fluid left pectoralis major,collection extending superiorly and medially to the left sternoclavicular joint.  Patient was admitted to the hospital working diagnosis of sepsis due to left pectoralis muscle cellulitis/abscess.  Patient tolerating weill IV antibiotic therapy with Vancomycin and Zosyn, he has remained afebrile with no leukocytosis. Persistent hard collection on his left anterior chest wall.  Transferred to [facility] from [facility] for debridement.   CTS made aware of the patient's arrival at [facility] and will see in consultation.  Seen by CTS with plan for I&D and surgical debridement on Monday, 05/30/2019.  Will be n.p.o. after midnight.  05/29/19: Patient was seen and examined at his bedside this morning.  Reports nausea and vomiting and anxiety this  morning.  IV Zofran in place.  PRN Xanax for anxiety.  Assessment/Plan: Active Problems:   Essential hypertension   Type 2 diabetes mellitus with other specified complication (HCC)   Sepsis ([place])  Sepsis secondary to left pectoralis muscle cellulitis/abscess  Presented with fever, tachycardia and tachypnea CT chest with contrast done on 05/31/2019 showed: There is a 5.6 x 2.8 cm ill-defined low attenuating collection in the left pectoralis major which may represent an old hematoma or infected fluid. This appears to extend more superiorly and medially to the left sternoclavicular joint. There is irregularity and fragmentation of the left sternoclavicular joint which may represent an infectious process. Initially was seen by general surgery, signed off on 05/26/2019 Transferred to [facility] on 05/26/2019 for possible debridement by CTS. CTS contacted and will see in consultation Continue broad-spectrum antibiotics IV vancomycin and IV Zosyn empirically. Sepsis physiology is resolving on IV antibiotics, T-max 99.4 Blood cultures x2 drawn peripherally taken on 05/31/2019- x4 days Aerobic culture taken from superficial specimen, wound abscess taken on 05/24/2019: Rare Staphylococcus aureus and rare group A strep.  Pansensitive. MRSA screening negative. Continue to maximize pain control Plan for I&D and surgical debridement on Monday, 05/30/2019 by CTS.  AKI, suspect prerenal Baseline creatinine appears to be 0.9 with GFR greater than 60 Creatinine 2.42 on 05/29/2019 with GFR of 36 Start gentle IV fluid hydration lactated ringer at 75 cc/h x 1 day Closely monitor urine output 1.9 L recorded in the last 24 hours Avoid nephrotoxins Repeat BMP in the morning  Transient hypoglycemia likely secondary to poor oral intake Hold off long-acting insulin to avoid hypoglycemia Continue insulin sliding scale as needed Goal CBG between 140 and 180  Insulin-dependent type 2 diabetes with hyperglycemia  Hemoglobin A1c 11.4 on 11/25/2018 Hemoglobin A1c 12.7  on 05/27/2019. Continue insulin coverage Avoid hyperglycemia  Mild acute blood loss anemia Baseline hemoglobin appears to be at 11.7 Drop in hemoglobin down to 10.0 on 05/29/2019 Closely monitor H&H  Suspected situational anxiety No prior history of chronic anxiety or psych illness States he feels very anxious about having surgery tomorrow States he always feels this way prior to the procedures.  Has had multiple surgeries Started Xanax PRN for anxiety  Intractable nausea and vomiting, unclear etiology No abdominal pain States that he vomited twice overnight Continue IV antiemetics  Chronic diastolic CHF Last 2D echo done on 07/30/2018 showed LVEF 65 to 70% Start strict I's and O's and daily weight Monitor volume status Continue metoprolol  Chronic normocytic anemia Hemoglobin appears to be at his baseline 10.4 with MCV of 91 on 05/28/2019 No sign of overt bleeding Drop H&H 10.0 on 05/29/2019. Continue to monitor H&H  Obesity BMI 36 Recommend weight loss outpatient with regular physical activity and healthy dieting  Essential hypertension Blood pressure is currently at goal. Continue metoprolol at home dose Continue to monitor vital signs  DVT prophylaxis:SCDs, holding off Lovenox subcu daily due to possible surgical procedure.  Defer chemical DVT prophylaxis to CTS when appropriate. Code Status:full Family Communication:no family at the bedside Disposition Plan: Patient is currently not appropriate for discharge at this time due to ongoing treatment for pectoralis abscess, possibly requiring debridement by cardiothoracic surgery.  Will discharge to home when CTS signs off and patient is hemodynamically stable.  Body mass index is 36.26 kg/m. Malnutrition Type:    Malnutrition Characteristics:    Nutrition Interventions:   RN Pressure Injury Documentation:   Consultants:   Surgery    CT surgery   Procedures:     Antimicrobials:    Vancomycin and Zosyn.    Objective: Vitals:   05/28/19 1936 05/29/19 0500 05/29/19 0518 05/29/19 0847  BP: (!) 145/90 138/89 (!) 107/91 138/72  Pulse: 96 82 93 88  Resp: 18 16 20 15   Temp: 99.3 F (37.4 C) 98.7 F (37.1 C) 97.8 F (36.6 C)   TempSrc: Oral Oral Oral   SpO2: 99% 98% 99% 97%  Weight:  117.3 kg    Height:        Intake/Output Summary (Last 24 hours) at 05/29/2019 1016 Last data filed at 05/29/2019 0500 Gross per 24 hour  Intake 220 ml  Output 1600 ml  Net -1380 ml   Filed Weights   05/23/19 2026 05/28/19 0417 05/29/19 0500  Weight: 117.9 kg 117 kg 117.3 kg    Exam:  . General: 48 y.o. year-old male well-developed well-nourished no acute distress.  Alert oriented x4. . Cardiovascular: Rhythm no rubs or gallops no JVD or thyromegaly noted.  Erythema and swelling noted on left upper chest.  2 healed medial lesions in the upper chest. . Respiratory: Clear to auscultation no wheezes or rales.  Poor inspiratory effort.   . Abdomen: Soft nontender nondistended normal bowel sounds present.   . Musculoskeletal: 2+ pitting edema in lower extremities bilaterally.  2 out of 4 pulses in all 4 extremities.   Marland Kitchen Psychiatry: Mood is appropriate for condition and setting.   Data Reviewed: CBC: Recent Labs  Lab 05/23/19 2133 05/24/19 0505 05/25/19 0456 05/26/19 0431 05/29/19 0442  WBC 8.5 9.8 8.1 7.0 7.1  NEUTROABS 6.8  --  5.7 4.8 5.4  HGB 11.7* 10.2* 10.4* 10.4* 10.0*  HCT 35.7* 31.1* 32.4* 32.8* 30.6*  MCV 88.4 90.1 91.0 91.9 89.0  PLT 350 296 307  310 [id number]   Basic Metabolic Panel: Recent Labs  Lab 05/23/19 2133 05/24/19 0505 05/25/19 0456 05/26/19 0431 05/28/19 0239 05/29/19 0442  NA 134* 133* 137 133*  --  138  K 3.6 3.4* 3.5 3.5  --  3.8  CL 98 101 104 104  --  106  CO2 23 20* 22 21*  --  22  GLUCOSE 128* 187* 187* 240*  --  171*  BUN 16 12 12 8   --  8  CREATININE 1.02 0.84 1.03 0.92  1.09 2.42*  CALCIUM 8.7* 7.9* 8.0* 8.0*  --  8.2*  MG  --   --   --   --   --  1.8  PHOS  --   --   --   --   --  3.5   GFR: Estimated Creatinine Clearance: 49.2 mL/min (A) (by C-G formula based on SCr of 2.42 mg/dL (H)). Liver Function Tests: Recent Labs  Lab 05/23/19 2133 05/29/19 0442  AST 17 16  ALT 12 8  ALKPHOS 79 65  BILITOT 0.3 0.6  PROT 7.9 7.2  ALBUMIN 2.7* 2.0*   No results for input(s): LIPASE, AMYLASE in the last 168 hours. No results for input(s): AMMONIA in the last 168 hours. Coagulation Profile: No results for input(s): INR, PROTIME in the last 168 hours. Cardiac Enzymes: No results for input(s): CKTOTAL, CKMB, CKMBINDEX, TROPONINI in the last 168 hours. BNP (last 3 results) No results for input(s): PROBNP in the last 8760 hours. HbA1C: Recent Labs    05/27/19 1212  HGBA1C 12.7*   CBG: Recent Labs  Lab 05/28/19 1320 05/28/19 1826 05/28/19 1849 05/28/19 2136 05/29/19 0635  GLUCAP 139* 68* 67* 198* 212*   Lipid Profile: No results for input(s): CHOL, HDL, LDLCALC, TRIG, CHOLHDL, LDLDIRECT in the last 72 hours. Thyroid Function Tests: No results for input(s): TSH, T4TOTAL, FREET4, T3FREE, THYROIDAB in the last 72 hours. Anemia Panel: No results for input(s): VITAMINB12, FOLATE, FERRITIN, TIBC, IRON, RETICCTPCT in the last 72 hours. Urine analysis:    Component Value Date/Time   COLORURINE YELLOW 05/24/2019 0106   APPEARANCEUR HAZY (A) 05/24/2019 0106   LABSPEC >1.046 (H) 05/24/2019 0106   PHURINE 5.0 05/24/2019 0106   GLUCOSEU 150 (A) 05/24/2019 0106   HGBUR SMALL (A) 05/24/2019 0106   BILIRUBINUR NEGATIVE 05/24/2019 0106   KETONESUR 20 (A) 05/24/2019 0106   PROTEINUR 30 (A) 05/24/2019 0106   NITRITE NEGATIVE 05/24/2019 0106   LEUKOCYTESUR NEGATIVE 05/24/2019 0106   Sepsis Labs: @LABRCNTIP (procalcitonin:4,lacticidven:4)  ) Recent Results (from the past 240 hour(s))  Culture, blood (routine x 2)     Status: None   Collection Time:  05/23/19  9:33 PM   Specimen: BLOOD  Result Value Ref Range Status   Specimen Description   Final    BLOOD BLOOD LEFT HAND Performed at [facility], [place] [address], [place], [place] [address]    Special Requests   Final    BOTTLES DRAWN AEROBIC AND ANAEROBIC Blood Culture adequate volume Performed at [place] [address], [place], [place] [address]    Culture   Final    NO GROWTH 5 DAYS Performed at [facility], [place] [address], [place], [place] [address]    Report Status 05/28/2019 FINAL  Final  Culture, blood (routine x 2)     Status: None   Collection Time: 05/23/19  9:33 PM   Specimen: BLOOD  Result Value Ref Range Status   Specimen Description  Final    BLOOD BLOOD RIGHT FOREARM Performed at [place] [address] [place], [place] [address]    Special Requests   Final    BOTTLES DRAWN AEROBIC ONLY Blood Culture results may not be optimal due to an inadequate volume of blood received in culture bottles Performed at [place] [address], [place], [place] [address]    Culture   Final    NO GROWTH 5 DAYS Performed at [facility], [place] [address], [place], [place] [address]    Report Status 05/28/2019 FINAL  Final  SARS Coronavirus 2 by RT PCR (hospital order, performed in [facility] hospital lab) Nasopharyngeal Nasopharyngeal Swab     Status: None   Collection Time: 05/24/19 12:26 AM   Specimen: Nasopharyngeal Swab  Result Value Ref Range Status   SARS Coronavirus 2 NEGATIVE NEGATIVE Final    Comment: (NOTE) If result is NEGATIVE SARS-CoV-2 target nucleic acids are NOT DETECTED. The SARS-CoV-2 RNA is generally detectable in upper and lower  respiratory specimens during the acute phase of infection. The lowest  concentration of SARS-CoV-2 viral copies this assay can detect is 250  copies / mL. A negative result does not preclude SARS-CoV-2 infection  and  should not be used as the sole basis for treatment or other  patient management decisions.  A negative result may occur with  improper specimen collection / handling, submission of specimen other  than nasopharyngeal swab, presence of viral mutation(s) within the  areas targeted by this assay, and inadequate number of viral copies  (<250 copies / mL). A negative result must be combined with clinical  observations, patient history, and epidemiological information. If result is POSITIVE SARS-CoV-2 target nucleic acids are DETECTED. The SARS-CoV-2 RNA is generally detectable in upper and lower  respiratory specimens dur ing the acute phase of infection.  Positive  results are indicative of active infection with SARS-CoV-2.  Clinical  correlation with patient history and other diagnostic information is  necessary to determine patient infection status.  Positive results do  not rule out bacterial infection or co-infection with other viruses. If result is PRESUMPTIVE POSTIVE SARS-CoV-2 nucleic acids MAY BE PRESENT.   A presumptive positive result was obtained on the submitted specimen  and confirmed on repeat testing.  While 2019 novel coronavirus  (SARS-CoV-2) nucleic acids may be present in the submitted sample  additional confirmatory testing may be necessary for epidemiological  and / or clinical management purposes  to differentiate between  SARS-CoV-2 and other Sarbecovirus currently known to infect humans.  If clinically indicated additional testing with an alternate test  methodology [id number]) is advised. The SARS-CoV-2 RNA is generally  detectable in upper and lower respiratory sp ecimens during the acute  phase of infection. The expected result is Negative. Fact Sheet for Patients:  StrictlyIdeas.no Fact Sheet for Healthcare Providers: BankingDealers.co.za This test is not yet approved or cleared by the [place] FDA and has been  authorized for detection and/or diagnosis of SARS-CoV-2 by FDA under an Emergency Use Authorization (EUA).  This EUA will remain in effect (meaning this test can be used) for the duration of the COVID-19 declaration under Section 564(b)(1) of the Act, 21 U.S.C. section 360bbb-3(b)(1), unless the authorization is terminated or revoked sooner. Performed at [facility], [place] [address], [place], [place] [address]   Aerobic Culture (superficial specimen)     Status: None   Collection Time: 05/24/19  8:05 AM  Specimen: Abscess; Wound  Result Value Ref Range Status   Specimen Description   Final    ABSCESS CHEST Performed at [place] [address] [place], [place] [address]    Special Requests   Final    NONE Performed at [facility], [place] [address], [place], [place] [address]    Gram Stain   Final    FEW WBC PRESENT, PREDOMINANTLY PMN FEW GRAM POSITIVE COCCI IN CLUSTERS Performed at [facility], [place] [address], [place], [place] [address]    Culture   Final    RARE STAPHYLOCOCCUS AUREUS RARE GROUP A STREP (S.PYOGENES) ISOLATED    Report Status 05/28/2019 FINAL  Final   Organism ID, Bacteria STAPHYLOCOCCUS AUREUS  Final      Susceptibility   Staphylococcus aureus - MIC*    CIPROFLOXACIN <=0.5 SENSITIVE Sensitive     ERYTHROMYCIN <=0.25 SENSITIVE Sensitive     GENTAMICIN <=0.5 SENSITIVE Sensitive     OXACILLIN 0.5 SENSITIVE Sensitive     TETRACYCLINE <=1 SENSITIVE Sensitive     VANCOMYCIN <=0.5 SENSITIVE Sensitive     TRIMETH/SULFA <=10 SENSITIVE Sensitive     CLINDAMYCIN <=0.25 SENSITIVE Sensitive     RIFAMPIN <=0.5 SENSITIVE Sensitive     Inducible Clindamycin NEGATIVE Sensitive     * RARE STAPHYLOCOCCUS AUREUS  MRSA PCR Screening     Status: None   Collection Time: 05/27/19  1:00 PM   Specimen: Nasal Mucosa; Nasopharyngeal  Result Value Ref Range Status   MRSA by PCR NEGATIVE NEGATIVE Final     Comment:        The GeneXpert MRSA Assay (FDA approved for NASAL specimens only), is one component of a comprehensive MRSA colonization surveillance program. It is not intended to diagnose MRSA infection nor to guide or monitor treatment for MRSA infections. Performed at [facility], [place] [address] [place], [place] [address]       Studies: No results found.  Scheduled Meds: . insulin aspart  0-20 Units Subcutaneous TID WC  . insulin aspart  0-5 Units Subcutaneous QHS  . insulin detemir  20 Units Subcutaneous Daily  . metoprolol tartrate  50 mg Oral BID  . senna-docusate  2 tablet Oral BID    Continuous Infusions: .  ceFAZolin (ANCEF) IV 2 g (05/29/19 0535)     LOS: 5 days     Kayleen Memos, MD [facility] Pager [phone]  If 7PM-7AM, please contact night-coverage [email or web address] Password [email or web address] 05/29/2019, 10:16 AM

## 2019-05-30 ENCOUNTER — Inpatient Hospital Stay (HOSPITAL_COMMUNITY): Payer: Self-pay | Admitting: Certified Registered Nurse Anesthetist

## 2019-05-30 ENCOUNTER — Encounter (HOSPITAL_COMMUNITY)
Admission: EM | Disposition: A | Discharge status: Home Health | Payer: Self-pay | Source: Home / Self Care | Attending: Internal Medicine

## 2019-05-30 ENCOUNTER — Encounter (HOSPITAL_COMMUNITY): Payer: Self-pay

## 2019-05-30 DIAGNOSIS — L03313 Cellulitis of chest wall: Secondary | ICD-10-CM

## 2019-05-30 HISTORY — PX: APPLICATION OF WOUND VAC: SHX5189

## 2019-05-30 HISTORY — PX: APPLICATION OF A-CELL OF CHEST/ABDOMEN: SHX6302

## 2019-05-30 HISTORY — PX: IRRIGATION AND DEBRIDEMENT STERNOCLAVICULAR JOINT-STERNUM AND RIBS: SHX6785

## 2019-05-30 LAB — GLUCOSE, CAPILLARY
Glucose-Capillary: 263 mg/dL — ABNORMAL HIGH (ref 70–99)
Glucose-Capillary: 275 mg/dL — ABNORMAL HIGH (ref 70–99)
Glucose-Capillary: 155 mg/dL — ABNORMAL HIGH (ref 70–99)
Glucose-Capillary: 239 mg/dL — ABNORMAL HIGH (ref 70–99)
Glucose-Capillary: 155 mg/dL — ABNORMAL HIGH (ref 70–99)

## 2019-05-30 LAB — BASIC METABOLIC PANEL
Anion gap: 11 (ref 5–15)
BUN: 9 mg/dL (ref 6–20)
CO2: 21 mmol/L — ABNORMAL LOW (ref 22–32)
Calcium: 8 mg/dL — ABNORMAL LOW (ref 8.9–10.3)
Chloride: 105 mmol/L (ref 98–111)
Creatinine, Ser: 2.53 mg/dL — ABNORMAL HIGH (ref 0.61–1.24)
GFR calc Af Amer: 34 mL/min — ABNORMAL LOW (ref >60)
GFR calc non Af Amer: 29 mL/min — ABNORMAL LOW (ref >60)
Glucose, Bld: 241 mg/dL — ABNORMAL HIGH (ref 70–99)
Potassium: 4 mmol/L (ref 3.5–5.1)
Sodium: 137 mmol/L (ref 135–145)

## 2019-05-30 LAB — PROTIME-INR
INR: 1.2 (ref 0.8–1.2)
Prothrombin Time: 15.3 seconds — ABNORMAL HIGH (ref 11.4–15.2)

## 2019-05-30 LAB — SURGICAL PCR SCREEN
MRSA, PCR: NEGATIVE
Staphylococcus aureus: NEGATIVE

## 2019-05-30 SURGERY — IRRIGATION AND DEBRIDEMENT OF STERNOCLAVICULAR JOINT-STERNUM AND RIBS
Anesthesia: General | Site: Chest

## 2019-05-30 MED ORDER — ALPRAZOLAM 0.25 MG PO TABS: 0.2500 mg | ORAL_TABLET | Freq: Once | ORAL | Status: DC

## 2019-05-30 MED ORDER — LIDOCAINE 2% (20 MG/ML) 5 ML SYRINGE
INTRAMUSCULAR | Status: DC | PRN
  Administered 2019-05-30: 100 mg via INTRAVENOUS

## 2019-05-30 MED ORDER — SUGAMMADEX SODIUM 200 MG/2ML IV SOLN
INTRAVENOUS | Status: DC | PRN
  Administered 2019-05-30: 300 mg via INTRAVENOUS

## 2019-05-30 MED ORDER — SODIUM CHLORIDE 0.9 % IV SOLN
INTRAVENOUS | Status: DC | PRN
  Administered 2019-05-30: 25 ug/min via INTRAVENOUS

## 2019-05-30 MED ORDER — PROPOFOL 10 MG/ML IV BOLUS
INTRAVENOUS | Status: AC
  Filled 2019-05-30: qty 20

## 2019-05-30 MED ORDER — HYDROMORPHONE HCL 1 MG/ML IJ SOLN: 0.2500 mg | INTRAMUSCULAR | Status: DC | PRN

## 2019-05-30 MED ORDER — MEPERIDINE HCL 25 MG/ML IJ SOLN: 6.2500 mg | INTRAMUSCULAR | Status: DC | PRN

## 2019-05-30 MED ORDER — FENTANYL CITRATE (PF) 250 MCG/5ML IJ SOLN
INTRAMUSCULAR | Status: DC | PRN
  Administered 2019-05-30: 50 ug via INTRAVENOUS
  Administered 2019-05-30: 100 ug via INTRAVENOUS

## 2019-05-30 MED ORDER — ONDANSETRON HCL 4 MG/2ML IJ SOLN
INTRAMUSCULAR | Status: AC
  Filled 2019-05-30: qty 2

## 2019-05-30 MED ORDER — PROPOFOL 10 MG/ML IV BOLUS
INTRAVENOUS | Status: DC | PRN
  Administered 2019-05-30: 200 mg via INTRAVENOUS

## 2019-05-30 MED ORDER — LACTATED RINGERS IV SOLN
INTRAVENOUS | Status: DC
  Administered 2019-05-30: 09:00:00 via INTRAVENOUS

## 2019-05-30 MED ORDER — SURGILUBE EX GEL
CUTANEOUS | Status: DC | PRN
  Administered 2019-05-30: 1 via TOPICAL

## 2019-05-30 MED ORDER — MIDAZOLAM HCL 2 MG/2ML IJ SOLN
INTRAMUSCULAR | Status: DC | PRN
  Administered 2019-05-30: 2 mg via INTRAVENOUS

## 2019-05-30 MED ORDER — SODIUM CHLORIDE 0.9 % IR SOLN
Status: DC | PRN
  Administered 2019-05-30: 1000 mL

## 2019-05-30 MED ORDER — FENTANYL CITRATE (PF) 250 MCG/5ML IJ SOLN
INTRAMUSCULAR | Status: AC
  Filled 2019-05-30: qty 5

## 2019-05-30 MED ORDER — ONDANSETRON HCL 4 MG/2ML IJ SOLN: 4.0000 mg | Freq: Once | INTRAMUSCULAR | Status: DC | PRN

## 2019-05-30 MED ORDER — MIDAZOLAM HCL 2 MG/2ML IJ SOLN
INTRAMUSCULAR | Status: AC
  Filled 2019-05-30: qty 2

## 2019-05-30 MED ORDER — ROCURONIUM BROMIDE 10 MG/ML (PF) SYRINGE
PREFILLED_SYRINGE | INTRAVENOUS | Status: DC | PRN
  Administered 2019-05-30: 80 mg via INTRAVENOUS

## 2019-05-30 MED ORDER — LACTATED RINGERS IV SOLN
INTRAVENOUS | Status: AC
  Administered 2019-05-30 – 2019-05-31 (×2): via INTRAVENOUS

## 2019-05-30 MED ORDER — ONDANSETRON HCL 4 MG/2ML IJ SOLN
INTRAMUSCULAR | Status: DC | PRN
  Administered 2019-05-30: 4 mg via INTRAVENOUS

## 2019-05-30 SURGICAL SUPPLY — 59 items
ATTRACTOMAT 16X20 MAGNETIC DRP (DRAPES) IMPLANT
BAG DECANTER FOR FLEXI CONT (MISCELLANEOUS) IMPLANT
BENZOIN TINCTURE PRP APPL 2/3 (GAUZE/BANDAGES/DRESSINGS) ×2 IMPLANT
BLADE CLIPPER SURG (BLADE) IMPLANT
BLADE SURG 10 STRL SS (BLADE) ×2 IMPLANT
BNDG GAUZE ELAST 4 BULKY (GAUZE/BANDAGES/DRESSINGS) IMPLANT
CANISTER SUCT 3000ML PPV (MISCELLANEOUS) ×2 IMPLANT
CANISTER WOUND CARE 500ML ATS (WOUND CARE) ×2 IMPLANT
CATH FOLEY 2WAY SLVR  5CC 16FR (CATHETERS)
CATH FOLEY 2WAY SLVR 5CC 16FR (CATHETERS) IMPLANT
CLIP VESOCCLUDE SM WIDE 24/CT (CLIP) IMPLANT
CONT SPEC 4OZ CLIKSEAL STRL BL (MISCELLANEOUS) ×4 IMPLANT
COVER SURGICAL LIGHT HANDLE (MISCELLANEOUS) ×2 IMPLANT
DRAPE LAPAROSCOPIC ABDOMINAL (DRAPES) ×2 IMPLANT
DRAPE WARM FLUID 44X44 (DRAPES) IMPLANT
DRSG AQUACEL AG ADV 3.5X14 (GAUZE/BANDAGES/DRESSINGS) IMPLANT
DRSG CUTIMED SORBACT 7X9 (GAUZE/BANDAGES/DRESSINGS) ×2 IMPLANT
DRSG VAC ATS LRG SENSATRAC (GAUZE/BANDAGES/DRESSINGS) IMPLANT
DRSG VAC ATS MED SENSATRAC (GAUZE/BANDAGES/DRESSINGS) ×2 IMPLANT
DRSG VAC ATS SM SENSATRAC (GAUZE/BANDAGES/DRESSINGS) IMPLANT
ELECT REM PT RETURN 9FT ADLT (ELECTROSURGICAL) ×2
ELECTRODE REM PT RTRN 9FT ADLT (ELECTROSURGICAL) ×1 IMPLANT
GAUZE SPONGE 4X4 12PLY STRL (GAUZE/BANDAGES/DRESSINGS) IMPLANT
GAUZE XEROFORM 5X9 LF (GAUZE/BANDAGES/DRESSINGS) IMPLANT
GLOVE BIO SURGEON STRL SZ 6.5 (GLOVE) ×2 IMPLANT
GLOVE BIO SURGEON STRL SZ7 (GLOVE) ×2 IMPLANT
GLOVE BIO SURGEON STRL SZ7.5 (GLOVE) ×2 IMPLANT
GLOVE SURG SS PI 6.5 STRL IVOR (GLOVE) ×2 IMPLANT
GOWN STRL REUS W/ TWL LRG LVL3 (GOWN DISPOSABLE) ×2 IMPLANT
GOWN STRL REUS W/ TWL XL LVL3 (GOWN DISPOSABLE) ×1 IMPLANT
GOWN STRL REUS W/TWL LRG LVL3 (GOWN DISPOSABLE) ×2
GOWN STRL REUS W/TWL XL LVL3 (GOWN DISPOSABLE) ×1
HANDPIECE INTERPULSE COAX TIP (DISPOSABLE)
HEMOSTAT POWDER SURGIFOAM 1G (HEMOSTASIS) IMPLANT
HEMOSTAT SURGICEL 2X14 (HEMOSTASIS) IMPLANT
KIT BASIN OR (CUSTOM PROCEDURE TRAY) ×2 IMPLANT
KIT TURNOVER KIT B (KITS) ×2 IMPLANT
MATRIX WOUND 3-LAYER 10X15 (Tissue) ×2 IMPLANT
MICROMATRIX 1000MG (Tissue) ×8 IMPLANT
NS IRRIG 1000ML POUR BTL (IV SOLUTION) ×2 IMPLANT
PACK GENERAL/GYN (CUSTOM PROCEDURE TRAY) ×2 IMPLANT
PAD ARMBOARD 7.5X6 YLW CONV (MISCELLANEOUS) ×4 IMPLANT
SET HNDPC FAN SPRY TIP SCT (DISPOSABLE) IMPLANT
SOL PREP POV-IOD 4OZ 10% (MISCELLANEOUS) ×2 IMPLANT
SOLUTION PARTIC MCRMTRX 1000MG (Tissue) ×4 IMPLANT
SPONGE LAP 18X18 RF (DISPOSABLE) ×2 IMPLANT
STAPLER VISISTAT 35W (STAPLE) IMPLANT
SURGILUBE 2OZ TUBE FLIPTOP (MISCELLANEOUS) ×2 IMPLANT
SUT VIC AB 1 CTX 36 (SUTURE)
SUT VIC AB 1 CTX36XBRD ANBCTR (SUTURE) IMPLANT
SUT VIC AB 2-0 CTX 27 (SUTURE) IMPLANT
SUT VIC AB 3-0 SH 8-18 (SUTURE) ×2 IMPLANT
SUT VIC AB 3-0 X1 27 (SUTURE) IMPLANT
SWAB COLLECTION DEVICE MRSA (MISCELLANEOUS) IMPLANT
SWAB CULTURE ESWAB REG 1ML (MISCELLANEOUS) IMPLANT
SYR 5ML LL (SYRINGE) ×2 IMPLANT
TOWEL GREEN STERILE (TOWEL DISPOSABLE) ×2 IMPLANT
TOWEL GREEN STERILE FF (TOWEL DISPOSABLE) ×2 IMPLANT
WATER STERILE IRR 1000ML POUR (IV SOLUTION) ×2 IMPLANT

## 2019-05-30 NOTE — Anesthesia Preprocedure Evaluation (Signed)
Anesthesia Evaluation  Patient identified by MRN, date of birth, ID band Patient awake    Reviewed: Allergy & Precautions, NPO status , Patient's Chart, lab work & pertinent test results  Airway Mallampati: I  TM Distance: >3 FB Neck ROM: Full    Dental   Pulmonary    Pulmonary exam normal        Cardiovascular hypertension, Pt. on medications Normal cardiovascular exam     Neuro/Psych    GI/Hepatic GERD  Medicated and Controlled,  Endo/Other  diabetes, Type 2  Renal/GU      Musculoskeletal   Abdominal   Peds  Hematology   Anesthesia Other Findings   Reproductive/Obstetrics                             Anesthesia Physical Anesthesia Plan  ASA: III  Anesthesia Plan: General   Post-op Pain Management:    Induction: Intravenous  PONV Risk Score and Plan: 2 and Ondansetron, Midazolam and Treatment may vary due to age or medical condition  Airway Management Planned: Oral ETT  Additional Equipment:   Intra-op Plan:   Post-operative Plan: Extubation in OR  Informed Consent: I have reviewed the patients History and Physical, chart, labs and discussed the procedure including the risks, benefits and alternatives for the proposed anesthesia with the patient or authorized representative who has indicated his/her understanding and acceptance.       Plan Discussed with: CRNA and Surgeon  Anesthesia Plan Comments:         Anesthesia Quick Evaluation

## 2019-05-30 NOTE — Progress Notes (Signed)
Inpatient Diabetes Program Recommendations  AACE/ADA: New Consensus Statement on Inpatient Glycemic Control (2015)  Target Ranges:  Prepandial:   less than 140 mg/dL      Peak postprandial:   less than 180 mg/dL (1-2 hours)      Critically ill patients:  140 - 180 mg/dL   Lab Results  Component Value Date   GLUCAP 275 (H) 05/30/2019   HGBA1C 12.7 (H) 05/27/2019    Review of Glycemic Control  CBGs 263, 275 mg/dL this am. Needs insulin adjustment.  Inpatient Diabetes Program Recommendations:     Increase Levemir to 25 units QD.  Will continue to closely follow.   Thank you. Lorenda Peck, RD, LDN, CDE Inpatient Diabetes Coordinator [phone]

## 2019-05-30 NOTE — Op Note (Signed)
      [address]       [place] [address]             [phone]        05/30/2019  Patient:  Taylor Hughes Pre-Op Dx: Left chest wall abscess   Post-op Dx:  same Procedure: - incision and drainage of left chest wall abscess - placement of ACell Matrix.  4 grams of MicroMatrix, and 10X15Cytal 3 Layer Matrix. - Placement of wound vac.  Surgeon and Role:      * Melanye Hiraldo, Lucile Crater, MD - Primary  Anesthesia  general EBL:  50 ml Blood Administration: none Specimen:  Chest wall abscess  Counts: correct   Indications: Mr. Daily is a 48 yo AA male with history of uncontrolled DM, Atrial Fibrillation, and HTN.  He presented to the ED on 05/23/2019 with complaints of fever and fatigue for 2 days.  Workup in the ED showed a fluid collection around the left pectoralis muscle. He was started on IV ABX and admitted to the hospital for further care.    Entire wound was 14 cm long and 5 cm at its deepest point.  Findings: There is no drainage coming from either incisions.  The lower incision was probed and did track track deep close to the sternoclavicular joint.  There was a large area of induration around the left pectoralis muscle.  An 18-gauge needle was inserted with the drainage of frank pus.  After the incision was used to open up the abscess cavity was probed and we unroofed the cavity.  The sternum and clavicle both had good integrity thus we did not perform a significant debridement of the bone.  Operative Technique: After the risks, benefits and alternatives were thoroughly discussed, the patient was brought to the operative theatre.  Anesthesia was induced, and the patient was prepped and draped in normal sterile fashion.  The lower wound was opened with a 2 cm incision after probing it and noting that it did track to the sternoclavicular joint.  On further inspection there was good integrity of the bone as it was bleeding with curettage.  The upper incision was  also probed and it ended in a blind tract.  Next an 18-gauge needle was inserted into the indurated area along the left pectoralis muscle with drainage of frank pus.  We extended our medial incision laterally to unroofed this abscess cavity.  I did track deep and more laterally and slightly medially.  The abscess was unroofed and all purulent material was debrided.  The wound was then copiously irrigated and hemostasis was obtained.  Next we placed 4g the micro matrix within the base and sides of the wound.  And a 10 x 15 3 layer sheet Cytal matrix was intact to the wound.  A wound VAC was then placed after securing the permeable wound sheet.  There was good seal with the wound VAC once it was initiated.  The patient tolerated the procedure without any immediate complications, and was transferred to the PACU in stable condition.  [name] [name]

## 2019-05-30 NOTE — Progress Notes (Signed)
PROGRESS NOTE  Taylor Hughes [id number] DOB: [birth year] DOA: 05/23/2019 PCP: Charlott Rakes, MD  HPI/Recap of past 24 hours: 48 yo male whopresented with fever and left anterior chest wall pain. 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 Patient reported fevers and chest pain for about 2 days. No history of local trauma. On his initial physical examination he was febrile 100.5 F, blood pressure 110/75, heart rate 108, respiratory 24, oxygen saturation 93%, his lungs are clear to auscultation bilaterally, heart S1-S2 present rhythmic, abdomen soft, no lower extremity edema. Tenderness in the left anterior chest wall with positive local induration. 2 opening wounds in the mid upper anterior thorax. Sodium134, potassium 3.6, chloride 98, bicarb 23,glucose 128, BUN 16, creatinine 1.0, lactic acid 2.2, white cell count 8.5, hemoglobin 11.7, hematocrit 35.7, platelets 350. SARSCOVID-19 was negative.Urine analysis specific gravity more than 1.046, 30 protein,glucose 150.CT chest with old hematoma/infected fluid left pectoralis major,collection extending superiorly and medially to the left sternoclavicular joint.  Patient was admitted to the hospital working diagnosis of sepsis due to left pectoralis muscle cellulitis/abscess.  Patient tolerating weill IV antibiotic therapy with Vancomycin and Zosyn, he has remained afebrile with no leukocytosis. Persistent hard collection on his left anterior chest wall.  Transferred to [facility] from [facility] for debridement.   CTS made aware of the patient's arrival at [facility] and will see in consultation.  Seen by CTS with plan for I&D and surgical debridement on Monday, 05/30/2019.  Will be n.p.o. after midnight.  05/30/19: Patient was seen and examined at his bedside this morning.  Felt anxious prior to surgery.  POD #0 post  left chest wall abscess I&D with placement of ACell matrix and wound VAC on 05/30/19 by Dr. Kipp Brood.   Assessment/Plan: Active Problems:   Essential hypertension   Type 2 diabetes mellitus with other specified complication (HCC)   Sepsis ([place])  POD #0 post left chest wall abscess I&D with placement of ACell matrix and wound VAC on 05/30/19 by Dr. Kipp Brood. Optimize pain control Monitor wound vac fluid output Follow cultures Continue antibiotics Abscess wound culture obtained on 05/24/19.  Grew staph aureus pansensitive.   Currently on Unasyn  Sepsis secondary to left pectoralis muscle cellulitis/abscess  Presented with fever, tachycardia and tachypnea CT chest with contrast done on 05/31/2019 showed: There is a 5.6 x 2.8 cm ill-defined low attenuating collection in the left pectoralis major which may represent an old hematoma or infected fluid. This appears to extend more superiorly and medially to the left sternoclavicular joint. There is irregularity and fragmentation of the left sternoclavicular joint which may represent an infectious process. Initially was seen by general surgery, signed off on 05/26/2019 Transferred to [facility] on 05/26/2019 for possible debridement by CTS. CTS contacted and will see in consultation Continue broad-spectrum antibiotics IV vancomycin and IV Zosyn empirically. Sepsis physiology is resolving on IV antibiotics, T-max 99.4 Blood cultures x2 drawn peripherally taken on 05/31/2019- x4 days Aerobic culture taken from superficial specimen, wound abscess taken on 05/24/2019: Rare Staphylococcus aureus and rare group A strep.  Pansensitive. MRSA screening negative. Continue to maximize pain control Post I&D and surgical debridement on Monday, 05/30/2019 by CTS.  Continue to monitor fever curve and WBC  AKI, suspect prerenal Baseline creatinine appears to be 0.9 with GFR greater than 60 Creatinine 2.42 on 05/29/2019 with GFR of 36 Creatinine 2.53 with  GFR of 34 on 05/30/2019  IV fluid hydration, Lactated Ringer at 100 cc/h x 2 days Closely monitor urine output Continue to avoid nephrotoxins Daily BMPs until creatinine starts to trend down.  Non anion gap metabolic acidosis likely secondary to acute renal failure Chemistry bicarb 21 and anion gap of 11 on 05/30/2019 Continue to closely monitor if not improved consider bicarb replacement  Transient hypoglycemia likely secondary to poor oral intake Hold off long-acting insulin to avoid hypoglycemia Continue insulin sliding scale as needed Goal CBG between 140 and 180  Insulin-dependent type 2 diabetes with hyperglycemia Hemoglobin A1c 11.4 on 11/25/2018 Hemoglobin A1c 12.7 on 05/27/2019. Continue insulin coverage Avoid hyperglycemia  Mild acute blood loss anemia Baseline hemoglobin appears to be at 11.7 Drop in hemoglobin down to 10.0 on 05/29/2019 Closely monitor H&H  Suspected situational anxiety No prior history of chronic anxiety or psych illness States he feels very anxious about having surgery tomorrow States he always feels this way prior to the procedures.  Has had multiple surgeries Started Xanax PRN for anxiety  Intractable nausea and vomiting, unclear etiology No abdominal pain States that he vomited twice overnight Continue IV antiemetics  Chronic diastolic CHF Last 2D echo done on 07/30/2018 showed LVEF 65 to 70% Start strict I's and O's and daily weight Monitor volume status Continue metoprolol  Chronic normocytic anemia Hemoglobin appears to be at his baseline 10.4 with MCV of 91 on 05/28/2019 No sign of overt bleeding Drop H&H 10.0 on 05/29/2019. Continue to monitor H&H  Obesity BMI 36 Recommend weight loss outpatient with regular physical activity and healthy dieting  Essential hypertension Blood pressure is currently at goal. Continue metoprolol at home dose Continue to monitor vital signs  DVT prophylaxis:Defer chemical DVT prophylaxis to  CTS when appropriate.   Code Status:full Family Communication:no family at the bedside Disposition Plan: Patient is currently not appropriate for discharge at this time due to ongoing treatment for pectoralis abscess, possibly requiring debridement by cardiothoracic surgery.  Will discharge to home when CTS signs off and patient is hemodynamically stable.  Body mass index is 36.26 kg/m. Malnutrition Type:    Malnutrition Characteristics:    Nutrition Interventions:   RN Pressure Injury Documentation:   Consultants:   Surgery   CT surgery   Procedures:     Antimicrobials:    Vancomycin and Zosyn.    Objective: Vitals:   05/30/19 0416 05/30/19 0808 05/30/19 1122 05/30/19 1136  BP:  (!) 149/85 130/88 135/83  Pulse:  83 80 78  Resp:  19 17 15   Temp:  98.5 F (36.9 C) 98.4 F (36.9 C)   TempSrc:  Oral    SpO2:  99% 95% 97%  Weight: 117.8 kg     Height:        Intake/Output Summary (Last 24 hours) at 05/30/2019 1208 Last data filed at 05/30/2019 1125 Gross per 24 hour  Intake 1180 ml  Output 2280 ml  Net -1100 ml   Filed Weights   05/28/19 0417 05/29/19 0500 05/30/19 0416  Weight: 117 kg 117.3 kg 117.8 kg    Exam:  . General: 48 y.o. year-old male seen this morning prior to surgery well-developed well-nourished in no acute distress.  Alert and oriented x4.   . Cardiovascular: Regular rate and rhythm no rubs or gallops noted.  Thyromegaly noted.  Erythema and swelling noted on left upper chest.  2 healed medial lesions in the upper chest. . Respiratory: Clear to auscultation no wheezes no rales.  Poor inspiratory effort. . Abdomen: Soft nontender  nondistended normal bowel sounds present.   . Musculoskeletal: 2+ pitting edema in lower extremities bilaterally.  2/4 pulses multiple extremities.   Marland Kitchen Psychiatry: Mood is appropriate for condition and setting.   Data Reviewed: CBC: Recent Labs  Lab 05/23/19 2133 05/24/19 0505  05/25/19 0456 05/26/19 0431 05/29/19 0442  WBC 8.5 9.8 8.1 7.0 7.1  NEUTROABS 6.8  --  5.7 4.8 5.4  HGB 11.7* 10.2* 10.4* 10.4* 10.0*  HCT 35.7* 31.1* 32.4* 32.8* 30.6*  MCV 88.4 90.1 91.0 91.9 89.0  PLT 350 296 307 310 [id number]   Basic Metabolic Panel: Recent Labs  Lab 05/24/19 0505 05/25/19 0456 05/26/19 0431 05/28/19 0239 05/29/19 0442 05/30/19 0229  NA 133* 137 133*  --  138 137  K 3.4* 3.5 3.5  --  3.8 4.0  CL 101 104 104  --  106 105  CO2 20* 22 21*  --  22 21*  GLUCOSE 187* 187* 240*  --  171* 241*  BUN 12 12 8   --  8 9  CREATININE 0.84 1.03 0.92 1.09 2.42* 2.53*  CALCIUM 7.9* 8.0* 8.0*  --  8.2* 8.0*  MG  --   --   --   --  1.8  --   PHOS  --   --   --   --  3.5  --    GFR: Estimated Creatinine Clearance: 47.1 mL/min (A) (by C-G formula based on SCr of 2.53 mg/dL (H)). Liver Function Tests: Recent Labs  Lab 05/23/19 2133 05/29/19 0442  AST 17 16  ALT 12 8  ALKPHOS 79 65  BILITOT 0.3 0.6  PROT 7.9 7.2  ALBUMIN 2.7* 2.0*   No results for input(s): LIPASE, AMYLASE in the last 168 hours. No results for input(s): AMMONIA in the last 168 hours. Coagulation Profile: Recent Labs  Lab 05/30/19 0229  INR 1.2   Cardiac Enzymes: No results for input(s): CKTOTAL, CKMB, CKMBINDEX, TROPONINI in the last 168 hours. BNP (last 3 results) No results for input(s): PROBNP in the last 8760 hours. HbA1C: Recent Labs    05/27/19 1212  HGBA1C 12.7*   CBG: Recent Labs  Lab 05/29/19 1644 05/29/19 2125 05/30/19 0645 05/30/19 0838 05/30/19 1124  GLUCAP 276* 174* 263* 275* 155*   Lipid Profile: No results for input(s): CHOL, HDL, LDLCALC, TRIG, CHOLHDL, LDLDIRECT in the last 72 hours. Thyroid Function Tests: No results for input(s): TSH, T4TOTAL, FREET4, T3FREE, THYROIDAB in the last 72 hours. Anemia Panel: No results for input(s): VITAMINB12, FOLATE, FERRITIN, TIBC, IRON, RETICCTPCT in the last 72 hours. Urine analysis:    Component Value Date/Time   COLORURINE  YELLOW 05/24/2019 0106   APPEARANCEUR HAZY (A) 05/24/2019 0106   LABSPEC >1.046 (H) 05/24/2019 0106   PHURINE 5.0 05/24/2019 0106   GLUCOSEU 150 (A) 05/24/2019 0106   HGBUR SMALL (A) 05/24/2019 0106   BILIRUBINUR NEGATIVE 05/24/2019 0106   KETONESUR 20 (A) 05/24/2019 0106   PROTEINUR 30 (A) 05/24/2019 0106   NITRITE NEGATIVE 05/24/2019 0106   LEUKOCYTESUR NEGATIVE 05/24/2019 0106   Sepsis Labs: @LABRCNTIP (procalcitonin:4,lacticidven:4)  ) Recent Results (from the past 240 hour(s))  Culture, blood (routine x 2)     Status: None   Collection Time: 05/23/19  9:33 PM   Specimen: BLOOD  Result Value Ref Range Status   Specimen Description   Final    BLOOD BLOOD LEFT HAND Performed at [facility], [place] [address], [place], [place] [address]    Special Requests   Final  BOTTLES DRAWN AEROBIC AND ANAEROBIC Blood Culture adequate volume Performed at [place] [address], [place], [place] [address]    Culture   Final    NO GROWTH 5 DAYS Performed at [facility], [place] [address], [place], [place] [address]    Report Status 05/28/2019 FINAL  Final  Culture, blood (routine x 2)     Status: None   Collection Time: 05/23/19  9:33 PM   Specimen: BLOOD  Result Value Ref Range Status   Specimen Description   Final    BLOOD BLOOD RIGHT FOREARM Performed at [place] [address], [place], [place] [address]    Special Requests   Final    BOTTLES DRAWN AEROBIC ONLY Blood Culture results may not be optimal due to an inadequate volume of blood received in culture bottles Performed at [place] [address] [place], [place] [address]    Culture   Final    NO GROWTH 5 DAYS Performed at [facility], [place] [address] [place],  [address]    Report Status 05/28/2019 FINAL  Final  SARS Coronavirus 2 by RT PCR (hospital order, performed in [facility] hospital lab) Nasopharyngeal  Nasopharyngeal Swab     Status: None   Collection Time: 05/24/19 12:26 AM   Specimen: Nasopharyngeal Swab  Result Value Ref Range Status   SARS Coronavirus 2 NEGATIVE NEGATIVE Final    Comment: (NOTE) If result is NEGATIVE SARS-CoV-2 target nucleic acids are NOT DETECTED. The SARS-CoV-2 RNA is generally detectable in upper and lower  respiratory specimens during the acute phase of infection. The lowest  concentration of SARS-CoV-2 viral copies this assay can detect is 250  copies / mL. A negative result does not preclude SARS-CoV-2 infection  and should not be used as the sole basis for treatment or other  patient management decisions.  A negative result may occur with  improper specimen collection / handling, submission of specimen other  than nasopharyngeal swab, presence of viral mutation(s) within the  areas targeted by this assay, and inadequate number of viral copies  (<250 copies / mL). A negative result must be combined with clinical  observations, patient history, and epidemiological information. If result is POSITIVE SARS-CoV-2 target nucleic acids are DETECTED. The SARS-CoV-2 RNA is generally detectable in upper and lower  respiratory specimens dur ing the acute phase of infection.  Positive  results are indicative of active infection with SARS-CoV-2.  Clinical  correlation with patient history and other diagnostic information is  necessary to determine patient infection status.  Positive results do  not rule out bacterial infection or co-infection with other viruses. If result is PRESUMPTIVE POSTIVE SARS-CoV-2 nucleic acids MAY BE PRESENT.   A presumptive positive result was obtained on the submitted specimen  and confirmed on repeat testing.  While 2019 novel coronavirus  (SARS-CoV-2) nucleic acids may be present in the submitted sample  additional confirmatory testing may be necessary for epidemiological  and / or clinical management purposes  to differentiate between   SARS-CoV-2 and other Sarbecovirus currently known to infect humans.  If clinically indicated additional testing with an alternate test  methodology [phone]) is advised. The SARS-CoV-2 RNA is generally  detectable in upper and lower respiratory sp ecimens during the acute  phase of infection. The expected result is Negative. Fact Sheet for Patients:  StrictlyIdeas.no Fact Sheet for Healthcare Providers: BankingDealers.co.za This test is not yet approved or cleared by the  [place] Architectural technologist and has been authorized for detection and/or diagnosis of SARS-CoV-2 by FDA under an Print production planner (EUA).  This EUA will remain in effect (meaning this test can be used) for the duration of the COVID-19 declaration under Section 564(b)(1) of the Act, 21 U.S.C. section 360bbb-3(b)(1), unless the authorization is terminated or revoked sooner. Performed at [facility], [place] [address], [place], [place] [address]   Aerobic Culture (superficial specimen)     Status: None   Collection Time: 05/24/19  8:05 AM   Specimen: Abscess; Wound  Result Value Ref Range Status   Specimen Description   Final    ABSCESS CHEST Performed at [place] [address] [place], [place] [address]    Special Requests   Final    NONE Performed at [facility], [place] [address] [place], [place] [address]    Gram Stain   Final    FEW WBC PRESENT, PREDOMINANTLY PMN FEW GRAM POSITIVE COCCI IN CLUSTERS Performed at [facility], [place] [address] [place], [place] [address]    Culture   Final    RARE STAPHYLOCOCCUS AUREUS RARE GROUP A STREP (S.PYOGENES) ISOLATED    Report Status 05/28/2019 FINAL  Final   Organism ID, Bacteria STAPHYLOCOCCUS AUREUS  Final      Susceptibility   Staphylococcus aureus - MIC*    CIPROFLOXACIN <=0.5 SENSITIVE Sensitive     ERYTHROMYCIN <=0.25 SENSITIVE Sensitive      GENTAMICIN <=0.5 SENSITIVE Sensitive     OXACILLIN 0.5 SENSITIVE Sensitive     TETRACYCLINE <=1 SENSITIVE Sensitive     VANCOMYCIN <=0.5 SENSITIVE Sensitive     TRIMETH/SULFA <=10 SENSITIVE Sensitive     CLINDAMYCIN <=0.25 SENSITIVE Sensitive     RIFAMPIN <=0.5 SENSITIVE Sensitive     Inducible Clindamycin NEGATIVE Sensitive     * RARE STAPHYLOCOCCUS AUREUS  MRSA PCR Screening     Status: None   Collection Time: 05/27/19  1:00 PM   Specimen: Nasal Mucosa; Nasopharyngeal  Result Value Ref Range Status   MRSA by PCR NEGATIVE NEGATIVE Final    Comment:        The GeneXpert MRSA Assay (FDA approved for NASAL specimens only), is one component of a comprehensive MRSA colonization surveillance program. It is not intended to diagnose MRSA infection nor to guide or monitor treatment for MRSA infections. Performed at [facility], [place] [address] [place], [place] [address]   Surgical pcr screen     Status: None   Collection Time: 05/30/19  1:34 AM   Specimen: Nasal Mucosa; Nasal Swab  Result Value Ref Range Status   MRSA, PCR NEGATIVE NEGATIVE Final   Staphylococcus aureus NEGATIVE NEGATIVE Final    Comment: (NOTE) The Xpert SA Assay (FDA approved for NASAL specimens in patients 61 years of age and older), is one component of a comprehensive surveillance program. It is not intended to diagnose infection nor to guide or monitor treatment. Performed at [facility], [place] [address], [place], [place] [address]       Studies: No results found.  Scheduled Meds: . insulin aspart  0-20 Units Subcutaneous TID WC  . insulin aspart  0-5 Units Subcutaneous QHS  . insulin detemir  20 Units Subcutaneous Daily  . metoprolol tartrate  50 mg Oral BID  . senna-docusate  2 tablet Oral BID    Continuous Infusions: .  ceFAZolin (ANCEF) IV 2 g (05/30/19 0540)  LOS: 6 days     Kayleen Memos, MD [facility] Pager [phone]  If 7PM-7AM, please contact  night-coverage [email or web address] Password [email or web address] 05/30/2019, 12:08 PM

## 2019-05-30 NOTE — Transfer of Care (Signed)
Immediate Anesthesia Transfer of Care Note  Patient: Taylor Hughes  Procedure(s) Performed: IRRIGATION AND DEBRIDEMENT OF STERNOCLAVICULAR JOINT-STERNUM AND RIBS  (N/A Chest) Application Of Wound Vac (N/A Chest) Application Of A-Cell Of Chest (N/A Chest)  Patient Location: PACU  Anesthesia Type:General  Level of Consciousness: awake, alert  and oriented  Airway & Oxygen Therapy: Patient Spontanous Breathing and Patient connected to nasal cannula oxygen  Post-op Assessment: Report given to RN and Post -op Vital signs reviewed and stable  Post vital signs: Reviewed and stable  Last Vitals:  Vitals Value Taken Time  BP 130/88 05/30/19 1122  Temp 36.9 C 05/30/19 1122  Pulse 79 05/30/19 1127  Resp 20 05/30/19 1127  SpO2 99 % 05/30/19 1127  Vitals shown include unvalidated device data.  Last Pain:  Vitals:   05/30/19 1122  TempSrc:   PainSc: Asleep      Patients Stated Pain Goal: 3 ([id number])  Complications: No apparent anesthesia complications

## 2019-05-30 NOTE — Anesthesia Postprocedure Evaluation (Signed)
Anesthesia Post Note  Patient: Taylor Hughes  Procedure(s) Performed: IRRIGATION AND DEBRIDEMENT OF STERNOCLAVICULAR JOINT-STERNUM AND RIBS  (N/A Chest) Application Of Wound Vac (N/A Chest) Application Of A-Cell Of Chest (N/A Chest)     Patient location during evaluation: PACU Anesthesia Type: General Level of consciousness: awake and alert Pain management: pain level controlled Vital Signs Assessment: post-procedure vital signs reviewed and stable Respiratory status: spontaneous breathing, nonlabored ventilation, respiratory function stable and patient connected to nasal cannula oxygen Cardiovascular status: blood pressure returned to baseline and stable Postop Assessment: no apparent nausea or vomiting Anesthetic complications: no    Last Vitals:  Vitals:   05/30/19 1136 05/30/19 1215  BP: 135/83 131/85  Pulse: 78 78  Resp: 15 18  Temp:  36.7 C  SpO2: 97% 99%    Last Pain:  Vitals:   05/30/19 1239  TempSrc:   PainSc: 2                  Jamaiyah Pyle DAVID

## 2019-05-30 NOTE — Progress Notes (Signed)
     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 OR today for incision and drainage, and possible [place] joint debridement.  Juleah Paradise [name]

## 2019-05-30 NOTE — Anesthesia Procedure Notes (Signed)
Procedure Name: Intubation Date/Time: 05/30/2019 9:59 AM Performed by: Valda Favia, CRNA Pre-anesthesia Checklist: Patient identified, Emergency Drugs available, Suction available, Patient being monitored and Timeout performed Patient Re-evaluated:Patient Re-evaluated prior to induction Oxygen Delivery Method: Circle system utilized Preoxygenation: Pre-oxygenation with 100% oxygen Induction Type: IV induction Ventilation: Mask ventilation without difficulty and Oral airway inserted - appropriate to patient size Laryngoscope Size: Mac and 4 Grade View: Grade II Tube type: Oral Tube size: 7.5 mm Number of attempts: 1 Airway Equipment and Method: Stylet Placement Confirmation: ETT inserted through vocal cords under direct vision,  positive ETCO2 and breath sounds checked- equal and bilateral Secured at: 23 cm Tube secured with: Tape Dental Injury: Teeth and Oropharynx as per pre-operative assessment

## 2019-05-31 ENCOUNTER — Encounter (HOSPITAL_COMMUNITY): Payer: Self-pay | Admitting: Thoracic Surgery (Cardiothoracic Vascular Surgery)

## 2019-05-31 LAB — CBC
HCT: 31.8 % — ABNORMAL LOW (ref 39.0–52.0)
Hemoglobin: 10.7 g/dL — ABNORMAL LOW (ref 13.0–17.0)
MCH: 29.8 pg (ref 26.0–34.0)
MCHC: 33.6 g/dL (ref 30.0–36.0)
MCV: 88.6 fL (ref 80.0–100.0)
Platelets: 409 10*3/uL — ABNORMAL HIGH (ref 150–400)
RBC: 3.59 MIL/uL — ABNORMAL LOW (ref 4.22–5.81)
RDW: 14.6 % (ref 11.5–15.5)
WBC: 6.6 10*3/uL (ref 4.0–10.5)
nRBC: 0 % (ref 0.0–0.2)

## 2019-05-31 LAB — BASIC METABOLIC PANEL
Anion gap: 14 (ref 5–15)
BUN: 14 mg/dL (ref 6–20)
CO2: 21 mmol/L — ABNORMAL LOW (ref 22–32)
Calcium: 8.4 mg/dL — ABNORMAL LOW (ref 8.9–10.3)
Chloride: 101 mmol/L (ref 98–111)
Creatinine, Ser: 2.25 mg/dL — ABNORMAL HIGH (ref 0.61–1.24)
GFR calc Af Amer: 39 mL/min — ABNORMAL LOW (ref >60)
GFR calc non Af Amer: 33 mL/min — ABNORMAL LOW (ref >60)
Glucose, Bld: 285 mg/dL — ABNORMAL HIGH (ref 70–99)
Potassium: 4.3 mmol/L (ref 3.5–5.1)
Sodium: 136 mmol/L (ref 135–145)

## 2019-05-31 LAB — GLUCOSE, CAPILLARY
Glucose-Capillary: 270 mg/dL — ABNORMAL HIGH (ref 70–99)
Glucose-Capillary: 222 mg/dL — ABNORMAL HIGH (ref 70–99)
Glucose-Capillary: 256 mg/dL — ABNORMAL HIGH (ref 70–99)
Glucose-Capillary: 273 mg/dL — ABNORMAL HIGH (ref 70–99)

## 2019-05-31 MED ORDER — SODIUM BICARBONATE 650 MG PO TABS
1300.0000 mg | ORAL_TABLET | Freq: Two times a day (BID) | ORAL | Status: AC
  Administered 2019-05-31 – 2019-06-02 (×4): 1300 mg via ORAL
  Filled 2019-05-31 (×5): qty 2

## 2019-05-31 MED ORDER — INSULIN DETEMIR 100 UNIT/ML ~~LOC~~ SOLN
25.0000 [IU] | Freq: Every day | SUBCUTANEOUS | Status: DC
  Administered 2019-06-01: 10:00:00 25 [IU] via SUBCUTANEOUS
  Filled 2019-05-31: qty 0.25

## 2019-05-31 MED ORDER — INSULIN ASPART 100 UNIT/ML ~~LOC~~ SOLN
4.0000 [IU] | Freq: Three times a day (TID) | SUBCUTANEOUS | Status: DC
  Administered 2019-06-01 – 2019-06-07 (×18): 4 [IU] via SUBCUTANEOUS

## 2019-05-31 NOTE — Progress Notes (Signed)
PROGRESS NOTE  Taylor Hughes [id number] DOB: 22-Aug-1970 DOA: 05/23/2019 PCP: Charlott Rakes, MD  HPI/Recap of past 24 hours: 48 yo male whopresented with fever and left anterior chest wall pain. 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 Patient reported fevers and chest pain for about 2 days. No history of local trauma. On his initial physical examination he was febrile 100.5 F, blood pressure 110/75, heart rate 108, respiratory 24, oxygen saturation 93%, his lungs are clear to auscultation bilaterally, heart S1-S2 present rhythmic, abdomen soft, no lower extremity edema. Tenderness in the left anterior chest wall with positive local induration. 2 opening wounds in the mid upper anterior thorax. Sodium134, potassium 3.6, chloride 98, bicarb 23,glucose 128, BUN 16, creatinine 1.0, lactic acid 2.2, white cell count 8.5, hemoglobin 11.7, hematocrit 35.7, platelets 350. SARSCOVID-19 was negative.Urine analysis specific gravity more than 1.046, 30 protein,glucose 150.CT chest with old hematoma/infected fluid left pectoralis major,collection extending superiorly and medially to the left sternoclavicular joint.  Patient was admitted to the hospital working diagnosis of sepsis due to left pectoralis muscle cellulitis/abscess.  Patient tolerating weill IV antibiotic therapy with Vancomycin and Zosyn, he has remained afebrile with no leukocytosis. Persistent hard collection on his left anterior chest wall.  Transferred to [facility] from [facility] for debridement.   CTS made aware of the patient's arrival at [facility] and will see in consultation.  Seen by CTS with plan for I&D and surgical debridement on Monday, 05/30/2019.  Will be n.p.o. after midnight.  05/30/19: Patient was seen and examined at his bedside this morning.  Felt anxious prior to surgery.  POD #0 post  left chest wall abscess I&D with placement of ACell matrix and wound VAC on 05/30/19 by Dr. Kipp Brood.  05/31/19: Patient was seen and examined at his bedside this morning.  No events overnight.  AKI is improving but not at baseline, good urine output.  Continue IV fluid hydration and continue to monitor urine output.   Assessment/Plan: Active Problems:   Essential hypertension   Type 2 diabetes mellitus with other specified complication (HCC)   Sepsis ([place])  POD #1 post left chest wall abscess I&D with placement of ACell matrix and wound VAC on 05/30/19 by Dr. Kipp Brood. Optimize pain control Monitor wound vac fluid output Follow cultures Continue antibiotics Abscess wound culture obtained on 05/24/19.  Grew staph aureus pansensitive.   Currently on Unasyn Tissue abscess chest wall deep wound Gram stain taken on 05/30/2019 showed no organisms.  Culture showed no growth less than 24. Deep wound abscess pectoralis taken on 05/30/2019 showed few gram-positive cocci in pairs.  Culture reintubated for better growth.  Sepsis secondary to left pectoralis muscle cellulitis/abscess  Presented with fever, tachycardia and tachypnea CT chest with contrast done on 05/31/2019 showed: There is a 5.6 x 2.8 cm ill-defined low attenuating collection in the left pectoralis major which may represent an old hematoma or infected fluid. This appears to extend more superiorly and medially to the left sternoclavicular joint. There is irregularity and fragmentation of the left sternoclavicular joint which may represent an infectious process. Initially was seen by general surgery, signed off on 05/26/2019 Transferred to [facility] on 05/26/2019 for possible debridement by CTS. Continue broad-spectrum antibiotics IV vancomycin and IV Zosyn empirically. Sepsis physiology is resolving on IV antibiotics, T-max 99.4 Blood cultures x2 drawn peripherally taken on 05/23/2019 no growth final. Aerobic culture taken from  superficial  specimen, wound abscess taken on 05/24/2019: Rare Staphylococcus aureus and rare group A strep.  Pansensitive. MRSA screening negative. Continue to maximize pain control Post I&D and surgical debridement on Monday, 05/30/2019 by CTS.  Microbiology data as stated above  Improving AKI, suspect prerenal secondary to poor oral intake Baseline creatinine appears to be 0.9 with GFR greater than 60 Creatinine 2.42 on 05/29/2019 with GFR of 36 Creatinine 2.53 with GFR of 34 on 05/30/2019 Creatinine 2.2 with GFR of 39 on 05/31/2019 Start daily BMPs Continue IV fluid hydration with lactated Ringer at 100 cc/h x 2 days. 1.4 L urine output recorded in the last 24 hours  continue to monitor urine output  Non anion gap metabolic acidosis likely secondary to acute renal failure Chemistry bicarb 21 and anion gap of 11 on 05/30/2019 Continue to closely monitor if not improved consider bicarb replacement Start p.o. bicarb replacement  Transient hypoglycemia likely secondary to poor oral intake Hold off long-acting insulin to avoid hypoglycemia Continue insulin sliding scale as needed Goal CBG between 140 and 180  Insulin-dependent type 2 diabetes with hyperglycemia Hemoglobin A1c 11.4 on 11/25/2018 Hemoglobin A1c 12.7 on 05/27/2019. Continue insulin coverage Avoid hyperglycemia Goal blood sugar between 140 and 180  Mild acute blood loss anemia Hemoglobin stable 10.7 from 10.0 on 05/29/2019 No sign of overt bleeding  Suspected situational anxiety No prior history of chronic anxiety or psych illness  Resolved intractable nausea and vomiting, unclear etiology  Chronic diastolic CHF Last 2D echo done on 07/30/2018 showed LVEF 65 to 70% Start strict I's and O's and daily weight Monitor volume status Continue metoprolol  Chronic normocytic anemia Hemoglobin appears to be at his baseline 10.4 with MCV of 91 on 05/28/2019 No sign of overt bleeding Drop H&H 10.0 on 05/29/2019.  Continue to monitor H&H  Obesity BMI 36 Recommend weight loss outpatient with regular physical activity and healthy dieting  Essential hypertension Blood pressure is currently at goal. Continue metoprolol at home dose Continue to monitor vital signs  DVT prophylaxis:Subcu Heparin 5000 U 3 times daily.  Code Status:full Family Communication:no family at the bedside Disposition Plan: Patient is currently not appropriate for discharge at this time due to ongoing treatment for pectoralis abscess, possibly requiring debridement by cardiothoracic surgery.  Will discharge to home when CTS signs off and patient is hemodynamically stable.  Body mass index is 36.26 kg/m. Malnutrition Type:    Malnutrition Characteristics:    Nutrition Interventions:   RN Pressure Injury Documentation:   Consultants:   Surgery   CT surgery   Procedures:     Antimicrobials:    Vancomycin and Zosyn.    Objective: Vitals:   05/30/19 1927 05/30/19 2232 05/31/19 0313 05/31/19 0827  BP: (!) 149/90 115/73 123/78 (!) 141/95  Pulse: 93 94 89 91  Resp: 18  18 16   Temp: 99.2 F (37.3 C)  98.8 F (37.1 C) 99.1 F (37.3 C)  TempSrc: Oral  Oral Oral  SpO2: 100% 97% 98% 99%  Weight:      Height:        Intake/Output Summary (Last 24 hours) at 05/31/2019 1444 Last data filed at 05/31/2019 0945 Gross per 24 hour  Intake 843.75 ml  Output 650 ml  Net 193.75 ml   Filed Weights   05/28/19 0417 05/29/19 0500 05/30/19 0416  Weight: 117 kg 117.3 kg 117.8 kg    Exam:  . General: 48 y.o. year-old male . Well-nourished in no acute distress.  Alert oriented x4. . Cardiovascular:  Regular rate and rhythm no rubs or gallops no JVD or thyromegaly noted.  Chest with wound VAC present.   Marland Kitchen Respiratory: Clear to auscultation no wheezes or rales.  Good respiratory effort.   Abdomen: Soft nontender. Normal bowel sounds present.  Musculoskeletal: 2+ pitting edema in lower  extremities bilaterally.  2 out of 4 pulses in all 4 extremities. Marland Kitchen Psychiatry: Mood is appropriate for condition and setting..   Data Reviewed: CBC: Recent Labs  Lab 05/25/19 0456 05/26/19 0431 05/29/19 0442 05/31/19 0743  WBC 8.1 7.0 7.1 6.6  NEUTROABS 5.7 4.8 5.4  --   HGB 10.4* 10.4* 10.0* 10.7*  HCT 32.4* 32.8* 30.6* 31.8*  MCV 91.0 91.9 89.0 88.6  PLT 307 310 365 [id number]*   Basic Metabolic Panel: Recent Labs  Lab 05/25/19 0456 05/26/19 0431 05/28/19 0239 05/29/19 0442 05/30/19 0229 05/31/19 0743  NA 137 133*  --  138 137 136  K 3.5 3.5  --  3.8 4.0 4.3  CL 104 104  --  106 105 101  CO2 22 21*  --  22 21* 21*  GLUCOSE 187* 240*  --  171* 241* 285*  BUN 12 8  --  8 9 14   CREATININE 1.03 0.92 1.09 2.42* 2.53* 2.25*  CALCIUM 8.0* 8.0*  --  8.2* 8.0* 8.4*  MG  --   --   --  1.8  --   --   PHOS  --   --   --  3.5  --   --    GFR: Estimated Creatinine Clearance: 53 mL/min (A) (by C-G formula based on SCr of 2.25 mg/dL (H)). Liver Function Tests: Recent Labs  Lab 05/29/19 0442  AST 16  ALT 8  ALKPHOS 65  BILITOT 0.6  PROT 7.2  ALBUMIN 2.0*   No results for input(s): LIPASE, AMYLASE in the last 168 hours. No results for input(s): AMMONIA in the last 168 hours. Coagulation Profile: Recent Labs  Lab 05/30/19 0229  INR 1.2   Cardiac Enzymes: No results for input(s): CKTOTAL, CKMB, CKMBINDEX, TROPONINI in the last 168 hours. BNP (last 3 results) No results for input(s): PROBNP in the last 8760 hours. HbA1C: No results for input(s): HGBA1C in the last 72 hours. CBG: Recent Labs  Lab 05/30/19 1124 05/30/19 1724 05/30/19 2113 05/31/19 0659 05/31/19 1156  GLUCAP 155* 239* 155* 270* 222*   Lipid Profile: No results for input(s): CHOL, HDL, LDLCALC, TRIG, CHOLHDL, LDLDIRECT in the last 72 hours. Thyroid Function Tests: No results for input(s): TSH, T4TOTAL, FREET4, T3FREE, THYROIDAB in the last 72 hours. Anemia Panel: No results for input(s):  VITAMINB12, FOLATE, FERRITIN, TIBC, IRON, RETICCTPCT in the last 72 hours. Urine analysis:    Component Value Date/Time   COLORURINE YELLOW 05/24/2019 0106   APPEARANCEUR HAZY (A) 05/24/2019 0106   LABSPEC >1.046 (H) 05/24/2019 0106   PHURINE 5.0 05/24/2019 0106   GLUCOSEU 150 (A) 05/24/2019 0106   HGBUR SMALL (A) 05/24/2019 0106   BILIRUBINUR NEGATIVE 05/24/2019 0106   KETONESUR 20 (A) 05/24/2019 0106   PROTEINUR 30 (A) 05/24/2019 0106   NITRITE NEGATIVE 05/24/2019 0106   LEUKOCYTESUR NEGATIVE 05/24/2019 0106   Sepsis Labs: @LABRCNTIP (procalcitonin:4,lacticidven:4)  ) Recent Results (from the past 240 hour(s))  Culture, blood (routine x 2)     Status: None   Collection Time: 05/23/19  9:33 PM   Specimen: BLOOD  Result Value Ref Range Status   Specimen Description   Final    BLOOD BLOOD LEFT HAND Performed at  [facility], [place] [address], [place], [place] [address]    Special Requests   Final    BOTTLES DRAWN AEROBIC AND ANAEROBIC Blood Culture adequate volume Performed at [place] [address] [place], [place] [address]    Culture   Final    NO GROWTH 5 DAYS Performed at [facility], [place] [address] [place], [place] [address]    Report Status 05/28/2019 FINAL  Final  Culture, blood (routine x 2)     Status: None   Collection Time: 05/23/19  9:33 PM   Specimen: BLOOD  Result Value Ref Range Status   Specimen Description   Final    BLOOD BLOOD RIGHT FOREARM Performed at [place] [address], [place], [place] [address]    Special Requests   Final    BOTTLES DRAWN AEROBIC ONLY Blood Culture results may not be optimal due to an inadequate volume of blood received in culture bottles Performed at [place] [address], [place], [place] [address]    Culture   Final    NO GROWTH 5 DAYS Performed at [facility], [place] [address], [place], [place] [address]     Report Status 05/28/2019 FINAL  Final  SARS Coronavirus 2 by RT PCR (hospital order, performed in [facility] hospital lab) Nasopharyngeal Nasopharyngeal Swab     Status: None   Collection Time: 05/24/19 12:26 AM   Specimen: Nasopharyngeal Swab  Result Value Ref Range Status   SARS Coronavirus 2 NEGATIVE NEGATIVE Final    Comment: (NOTE) If result is NEGATIVE SARS-CoV-2 target nucleic acids are NOT DETECTED. The SARS-CoV-2 RNA is generally detectable in upper and lower  respiratory specimens during the acute phase of infection. The lowest  concentration of SARS-CoV-2 viral copies this assay can detect is 250  copies / mL. A negative result does not preclude SARS-CoV-2 infection  and should not be used as the sole basis for treatment or other  patient management decisions.  A negative result may occur with  improper specimen collection / handling, submission of specimen other  than nasopharyngeal swab, presence of viral mutation(s) within the  areas targeted by this assay, and inadequate number of viral copies  (<250 copies / mL). A negative result must be combined with clinical  observations, patient history, and epidemiological information. If result is POSITIVE SARS-CoV-2 target nucleic acids are DETECTED. The SARS-CoV-2 RNA is generally detectable in upper and lower  respiratory specimens dur ing the acute phase of infection.  Positive  results are indicative of active infection with SARS-CoV-2.  Clinical  correlation with patient history and other diagnostic information is  necessary to determine patient infection status.  Positive results do  not rule out bacterial infection or co-infection with other viruses. If result is PRESUMPTIVE POSTIVE SARS-CoV-2 nucleic acids MAY BE PRESENT.   A presumptive positive result was obtained on the submitted specimen  and confirmed on repeat testing.  While 2019 novel coronavirus  (SARS-CoV-2) nucleic acids may be present in the submitted  sample  additional confirmatory testing may be necessary for epidemiological  and / or clinical management purposes  to differentiate between  SARS-CoV-2 and other Sarbecovirus currently known to infect humans.  If clinically indicated additional testing with an alternate test  methodology [phone]) is advised. The SARS-CoV-2 RNA is generally  detectable in upper and lower respiratory sp ecimens during the acute  phase of infection. The expected result is Negative.  Fact Sheet for Patients:  StrictlyIdeas.no Fact Sheet for Healthcare Providers: BankingDealers.co.za This test is not yet approved or cleared by the [place] FDA and has been authorized for detection and/or diagnosis of SARS-CoV-2 by FDA under an Emergency Use Authorization (EUA).  This EUA will remain in effect (meaning this test can be used) for the duration of the COVID-19 declaration under Section 564(b)(1) of the Act, 21 U.S.C. section 360bbb-3(b)(1), unless the authorization is terminated or revoked sooner. Performed at [facility], [place] [address] [place], [place] [address]   Aerobic Culture (superficial specimen)     Status: None   Collection Time: 05/24/19  8:05 AM   Specimen: Abscess; Wound  Result Value Ref Range Status   Specimen Description   Final    ABSCESS CHEST Performed at [place] [address] [place], [place] [address]    Special Requests   Final    NONE Performed at [facility], [place] [address], [place], [place] [address]    Gram Stain   Final    FEW WBC PRESENT, PREDOMINANTLY PMN FEW GRAM POSITIVE COCCI IN CLUSTERS Performed at [facility], [place] [address], [place], [place] [address]    Culture   Final    RARE STAPHYLOCOCCUS AUREUS RARE GROUP A STREP (S.PYOGENES) ISOLATED    Report Status 05/28/2019 FINAL  Final   Organism ID, Bacteria STAPHYLOCOCCUS AUREUS  Final       Susceptibility   Staphylococcus aureus - MIC*    CIPROFLOXACIN <=0.5 SENSITIVE Sensitive     ERYTHROMYCIN <=0.25 SENSITIVE Sensitive     GENTAMICIN <=0.5 SENSITIVE Sensitive     OXACILLIN 0.5 SENSITIVE Sensitive     TETRACYCLINE <=1 SENSITIVE Sensitive     VANCOMYCIN <=0.5 SENSITIVE Sensitive     TRIMETH/SULFA <=10 SENSITIVE Sensitive     CLINDAMYCIN <=0.25 SENSITIVE Sensitive     RIFAMPIN <=0.5 SENSITIVE Sensitive     Inducible Clindamycin NEGATIVE Sensitive     * RARE STAPHYLOCOCCUS AUREUS  MRSA PCR Screening     Status: None   Collection Time: 05/27/19  1:00 PM   Specimen: Nasal Mucosa; Nasopharyngeal  Result Value Ref Range Status   MRSA by PCR NEGATIVE NEGATIVE Final    Comment:        The GeneXpert MRSA Assay (FDA approved for NASAL specimens only), is one component of a comprehensive MRSA colonization surveillance program. It is not intended to diagnose MRSA infection nor to guide or monitor treatment for MRSA infections. Performed at [facility], [place] [address], [place], [place] [address]   Surgical pcr screen     Status: None   Collection Time: 05/30/19  1:34 AM   Specimen: Nasal Mucosa; Nasal Swab  Result Value Ref Range Status   MRSA, PCR NEGATIVE NEGATIVE Final   Staphylococcus aureus NEGATIVE NEGATIVE Final    Comment: (NOTE) The Xpert SA Assay (FDA approved for NASAL specimens in patients 31 years of age and older), is one component of a comprehensive surveillance program. It is not intended to diagnose infection nor to guide or monitor treatment. Performed at [facility], [place] [address], [place], [place] [address]   Aerobic/Anaerobic Culture (surgical/deep wound)     Status: None (Preliminary result)   Collection Time: 05/30/19 10:19 AM   Specimen: PATH Soft tissue  Result Value Ref Range Status   Specimen Description TISSUE ABSCESS CHEST WALL  Final   Special Requests A  Final  Gram Stain   Final    RARE WBC PRESENT, PREDOMINANTLY  PMN NO ORGANISMS SEEN    Culture   Final    NO GROWTH < 24 HOURS Performed at [place] [address], [place], [place] [address]    Report Status PENDING  Incomplete  Aerobic/Anaerobic Culture (surgical/deep wound)     Status: None (Preliminary result)   Collection Time: 05/30/19 10:34 AM   Specimen: PATH Other; Body Fluid  Result Value Ref Range Status   Specimen Description ABSCESS PECTORALIS  Final   Special Requests B  Final   Gram Stain   Final    MODERATE WBC PRESENT, PREDOMINANTLY PMN FEW GRAM POSITIVE COCCI IN PAIRS    Culture   Final    CULTURE REINCUBATED FOR BETTER GROWTH Performed at [facility], [place] [address], [place], [place] [address]    Report Status PENDING  Incomplete      Studies: No results found.  Scheduled Meds: . insulin aspart  0-20 Units Subcutaneous TID WC  . insulin aspart  0-5 Units Subcutaneous QHS  . insulin detemir  20 Units Subcutaneous Daily  . metoprolol tartrate  50 mg Oral BID  . senna-docusate  2 tablet Oral BID    Continuous Infusions: .  ceFAZolin (ANCEF) IV 2 g (05/31/19 0531)  . lactated ringers 100 mL/hr at 05/31/19 0210     LOS: 7 days     Kayleen Memos, MD [facility] Pager [phone]  If 7PM-7AM, please contact night-coverage [email or web address] Password [email or web address] 05/31/2019, 2:44 PM

## 2019-05-31 NOTE — Progress Notes (Addendum)
[address]       [place] [address]             [phone]      1 Day Post-Op Procedure(s) (LRB): IRRIGATION AND DEBRIDEMENT OF STERNOCLAVICULAR JOINT-STERNUM AND RIBS  (N/A) Application Of Wound Vac (N/A) Application Of A-Cell Of Chest (N/A) Subjective: Feels pretty well, pain control is good  Objective: Vital signs in last 24 hours: Temp:  [98.1 F (36.7 C)-99.2 F (37.3 C)] 99.1 F (37.3 C) (10/20 0827) Pulse Rate:  [78-94] 91 (10/20 0827) Cardiac Rhythm: Normal sinus rhythm (10/19 1136) Resp:  [15-18] 16 (10/20 0827) BP: (115-149)/(73-95) 141/95 (10/20 0827) SpO2:  [95 %-100 %] 99 % (10/20 0827)  Hemodynamic parameters for last 24 hours:    Intake/Output from previous day: 10/19 0701 - 10/20 0700 In: 1423.8 [P.O.:120; I.V.:703.8; IV Piggyback:600] Out: 1330 [Urine:1300; Blood:30] Intake/Output this shift: No intake/output data recorded.  General appearance: alert, cooperative and no distress Heart: regular rate and rhythm Lungs: min dim in bases, o/w clear Abdomen: benign Wound: Vac in place, no surrounding erethema, serosang drainage  Lab Results: Recent Labs    05/29/19 0442 05/31/19 0743  WBC 7.1 6.6  HGB 10.0* 10.7*  HCT 30.6* 31.8*  PLT 365 409*   BMET:  Recent Labs    05/30/19 0229 05/31/19 0743  NA 137 136  K 4.0 4.3  CL 105 101  CO2 21* 21*  GLUCOSE 241* 285*  BUN 9 14  CREATININE 2.53* 2.25*  CALCIUM 8.0* 8.4*    PT/INR:  Recent Labs    05/30/19 0229  LABPROT 15.3*  INR 1.2   ABG    Component Value Date/Time   HCO3 26.9 12/17/2018 2150   TCO2 28 12/17/2018 2150   ACIDBASEDEF 17.6 (H) 11/22/2018 1049   O2SAT 81.0 12/17/2018 2150   CBG (last 3)  Recent Labs    05/30/19 1724 05/30/19 2113 05/31/19 0659  GLUCAP 239* 155* 270*   Results for orders placed or performed during the hospital encounter of 05/23/19  Culture, blood (routine x 2)     Status: None   Collection Time: 05/23/19  9:33 PM    Specimen: BLOOD  Result Value Ref Range Status   Specimen Description   Final    BLOOD BLOOD LEFT HAND Performed at [facility], [place] [address] [place], [place] [address]    Special Requests   Final    BOTTLES DRAWN AEROBIC AND ANAEROBIC Blood Culture adequate volume Performed at [facility] [address], [place], [place] [address]    Culture   Final    NO GROWTH 5 DAYS Performed at [facility], [place] [address], [place], [place] [address]    Report Status 05/28/2019 FINAL  Final  Culture, blood (routine x 2)     Status: None   Collection Time: 05/23/19  9:33 PM   Specimen: BLOOD  Result Value Ref Range Status   Specimen Description   Final    BLOOD BLOOD RIGHT FOREARM Performed at [facility] [address], [place], [place] [address]    Special Requests   Final    BOTTLES DRAWN AEROBIC ONLY Blood Culture results may not be optimal due to an inadequate volume of blood received in culture bottles Performed at [facility] [address] [place], [place] [address]    Culture   Final    NO GROWTH 5 DAYS Performed at  [facility], [place] [address] [place], [place] [address]    Report Status 05/28/2019 FINAL  Final  SARS Coronavirus 2 by RT PCR (hospital order, performed in [facility] hospital lab) Nasopharyngeal Nasopharyngeal Swab     Status: None   Collection Time: 05/24/19 12:26 AM   Specimen: Nasopharyngeal Swab  Result Value Ref Range Status   SARS Coronavirus 2 NEGATIVE NEGATIVE Final    Comment: (NOTE) If result is NEGATIVE SARS-CoV-2 target nucleic acids are NOT DETECTED. The SARS-CoV-2 RNA is generally detectable in upper and lower  respiratory specimens during the acute phase of infection. The lowest  concentration of SARS-CoV-2 viral copies this assay can detect is 250  copies / mL. A negative result does not preclude SARS-CoV-2 infection  and should not be used as  the sole basis for treatment or other  patient management decisions.  A negative result may occur with  improper specimen collection / handling, submission of specimen other  than nasopharyngeal swab, presence of viral mutation(s) within the  areas targeted by this assay, and inadequate number of viral copies  (<250 copies / mL). A negative result must be combined with clinical  observations, patient history, and epidemiological information. If result is POSITIVE SARS-CoV-2 target nucleic acids are DETECTED. The SARS-CoV-2 RNA is generally detectable in upper and lower  respiratory specimens dur ing the acute phase of infection.  Positive  results are indicative of active infection with SARS-CoV-2.  Clinical  correlation with patient history and other diagnostic information is  necessary to determine patient infection status.  Positive results do  not rule out bacterial infection or co-infection with other viruses. If result is PRESUMPTIVE POSTIVE SARS-CoV-2 nucleic acids MAY BE PRESENT.   A presumptive positive result was obtained on the submitted specimen  and confirmed on repeat testing.  While 2019 novel coronavirus  (SARS-CoV-2) nucleic acids may be present in the submitted sample  additional confirmatory testing may be necessary for epidemiological  and / or clinical management purposes  to differentiate between  SARS-CoV-2 and other Sarbecovirus currently known to infect humans.  If clinically indicated additional testing with an alternate test  methodology [phone]) is advised. The SARS-CoV-2 RNA is generally  detectable in upper and lower respiratory sp ecimens during the acute  phase of infection. The expected result is Negative. Fact Sheet for Patients:  StrictlyIdeas.no Fact Sheet for Healthcare Providers: BankingDealers.co.za This test is not yet approved or cleared by the [place] FDA and has been authorized for  detection and/or diagnosis of SARS-CoV-2 by FDA under an Emergency Use Authorization (EUA).  This EUA will remain in effect (meaning this test can be used) for the duration of the COVID-19 declaration under Section 564(b)(1) of the Act, 21 U.S.C. section 360bbb-3(b)(1), unless the authorization is terminated or revoked sooner. Performed at [facility], [place] [address], [place], [place] [address]   Aerobic Culture (superficial specimen)     Status: None   Collection Time: 05/24/19  8:05 AM   Specimen: Abscess; Wound  Result Value Ref Range Status   Specimen Description   Final    ABSCESS CHEST Performed at [facility] [address], [place], [place] [address]    Special Requests   Final    NONE Performed at [facility], [place] [address], [place], [place] [address]    Gram Stain   Final    FEW WBC PRESENT, PREDOMINANTLY PMN FEW GRAM POSITIVE COCCI IN CLUSTERS Performed at  [facility], [place] [address] [place], [place] [address]    Culture   Final    RARE STAPHYLOCOCCUS AUREUS RARE GROUP A STREP (S.PYOGENES) ISOLATED    Report Status 05/28/2019 FINAL  Final   Organism ID, Bacteria STAPHYLOCOCCUS AUREUS  Final      Susceptibility   Staphylococcus aureus - MIC*    CIPROFLOXACIN <=0.5 SENSITIVE Sensitive     ERYTHROMYCIN <=0.25 SENSITIVE Sensitive     GENTAMICIN <=0.5 SENSITIVE Sensitive     OXACILLIN 0.5 SENSITIVE Sensitive     TETRACYCLINE <=1 SENSITIVE Sensitive     VANCOMYCIN <=0.5 SENSITIVE Sensitive     TRIMETH/SULFA <=10 SENSITIVE Sensitive     CLINDAMYCIN <=0.25 SENSITIVE Sensitive     RIFAMPIN <=0.5 SENSITIVE Sensitive     Inducible Clindamycin NEGATIVE Sensitive     * RARE STAPHYLOCOCCUS AUREUS  MRSA PCR Screening     Status: None   Collection Time: 05/27/19  1:00 PM   Specimen: Nasal Mucosa; Nasopharyngeal  Result Value Ref Range Status   MRSA by PCR NEGATIVE NEGATIVE Final    Comment:        The  GeneXpert MRSA Assay (FDA approved for NASAL specimens only), is one component of a comprehensive MRSA colonization surveillance program. It is not intended to diagnose MRSA infection nor to guide or monitor treatment for MRSA infections. Performed at [facility], [place] [address], [place], [place] [address]   Surgical pcr screen     Status: None   Collection Time: 05/30/19  1:34 AM   Specimen: Nasal Mucosa; Nasal Swab  Result Value Ref Range Status   MRSA, PCR NEGATIVE NEGATIVE Final   Staphylococcus aureus NEGATIVE NEGATIVE Final    Comment: (NOTE) The Xpert SA Assay (FDA approved for NASAL specimens in patients 3 years of age and older), is one component of a comprehensive surveillance program. It is not intended to diagnose infection nor to guide or monitor treatment. Performed at [facility], [place] [address], [place], [place] [address]   Aerobic/Anaerobic Culture (surgical/deep wound)     Status: None (Preliminary result)   Collection Time: 05/30/19 10:19 AM   Specimen: PATH Soft tissue  Result Value Ref Range Status   Specimen Description TISSUE ABSCESS CHEST WALL  Final   Special Requests A  Final   Gram Stain   Final    RARE WBC PRESENT, PREDOMINANTLY PMN NO ORGANISMS SEEN Performed at [facility], [place] [address] [place], [place] [address]    Culture PENDING  Incomplete   Report Status PENDING  Incomplete  Aerobic/Anaerobic Culture (surgical/deep wound)     Status: None (Preliminary result)   Collection Time: 05/30/19 10:34 AM   Specimen: PATH Other; Body Fluid  Result Value Ref Range Status   Specimen Description ABSCESS PECTORALIS  Final   Special Requests B  Final   Gram Stain   Final    MODERATE WBC PRESENT, PREDOMINANTLY PMN FEW GRAM POSITIVE COCCI IN PAIRS Performed at [facility], [address] [place], [place] [address]    Culture PENDING  Incomplete   Report Status PENDING  Incomplete   Meds Scheduled Meds: . insulin  aspart  0-20 Units Subcutaneous TID WC  . insulin aspart  0-5 Units Subcutaneous QHS  . insulin detemir  20 Units Subcutaneous Daily  . metoprolol tartrate  50 mg Oral BID  . senna-docusate  2 tablet Oral BID   Continuous Infusions: .  ceFAZolin (ANCEF) IV 2  g (05/31/19 0531)  . lactated ringers 100 mL/hr at 05/31/19 0210   PRN Meds:.acetaminophen **OR** acetaminophen, HYDROcodone-acetaminophen, loperamide, morphine injection, ondansetron **OR** ondansetron (ZOFRAN) IV  Xrays No results found.  Assessment/Plan: S/P Procedure(s) (LRB): IRRIGATION AND DEBRIDEMENT OF STERNOCLAVICULAR JOINT-STERNUM AND RIBS  (N/A) Application Of Wound Vac (N/A) Application Of A-Cell Of Chest (N/A)  1 doing well with VAC in place  2 no leukocytosis or fevers, currently on ancef 3 medical management as per primary svc  LOS: 7 days    John Giovanni [facility] [facility] 05/31/2019  Pager [phone]  Agree with above Will plan for wound vac change POD 7

## 2019-05-31 NOTE — Plan of Care (Signed)

## 2019-05-31 NOTE — Plan of Care (Signed)
  Problem: Clinical Measurements: Goal: Will remain free from infection Outcome: Progressing   Problem: Pain Managment: Goal: General experience of comfort will improve Outcome: Progressing   Problem: Safety: Goal: Ability to remain free from injury will improve Outcome: Progressing   Problem: Skin Integrity: Goal: Risk for impaired skin integrity will decrease Outcome: Progressing   [name], RN 05/31/2019 1216

## 2019-06-01 ENCOUNTER — Inpatient Hospital Stay: Payer: Self-pay

## 2019-06-01 DIAGNOSIS — Z872 Personal history of diseases of the skin and subcutaneous tissue: Secondary | ICD-10-CM

## 2019-06-01 DIAGNOSIS — I10 Essential (primary) hypertension: Secondary | ICD-10-CM

## 2019-06-01 DIAGNOSIS — B9561 Methicillin susceptible Staphylococcus aureus infection as the cause of diseases classified elsewhere: Secondary | ICD-10-CM

## 2019-06-01 DIAGNOSIS — M00012 Staphylococcal arthritis, left shoulder: Secondary | ICD-10-CM

## 2019-06-01 DIAGNOSIS — E11628 Type 2 diabetes mellitus with other skin complications: Secondary | ICD-10-CM

## 2019-06-01 DIAGNOSIS — Z978 Presence of other specified devices: Secondary | ICD-10-CM

## 2019-06-01 DIAGNOSIS — M00212 Other streptococcal arthritis, left shoulder: Secondary | ICD-10-CM

## 2019-06-01 DIAGNOSIS — L089 Local infection of the skin and subcutaneous tissue, unspecified: Secondary | ICD-10-CM

## 2019-06-01 DIAGNOSIS — B950 Streptococcus, group A, as the cause of diseases classified elsewhere: Secondary | ICD-10-CM

## 2019-06-01 LAB — BASIC METABOLIC PANEL
Anion gap: 9 (ref 5–15)
BUN: 14 mg/dL (ref 6–20)
CO2: 24 mmol/L (ref 22–32)
Calcium: 8.6 mg/dL — ABNORMAL LOW (ref 8.9–10.3)
Chloride: 105 mmol/L (ref 98–111)
Creatinine, Ser: 1.96 mg/dL — ABNORMAL HIGH (ref 0.61–1.24)
GFR calc Af Amer: 46 mL/min — ABNORMAL LOW (ref >60)
GFR calc non Af Amer: 40 mL/min — ABNORMAL LOW (ref >60)
Glucose, Bld: 124 mg/dL — ABNORMAL HIGH (ref 70–99)
Potassium: 4.4 mmol/L (ref 3.5–5.1)
Sodium: 138 mmol/L (ref 135–145)

## 2019-06-01 LAB — GLUCOSE, CAPILLARY
Glucose-Capillary: 134 mg/dL — ABNORMAL HIGH (ref 70–99)
Glucose-Capillary: 191 mg/dL — ABNORMAL HIGH (ref 70–99)
Glucose-Capillary: 58 mg/dL — ABNORMAL LOW (ref 70–99)
Glucose-Capillary: 90 mg/dL (ref 70–99)
Glucose-Capillary: 182 mg/dL — ABNORMAL HIGH (ref 70–99)

## 2019-06-01 MED ORDER — INSULIN DETEMIR 100 UNIT/ML ~~LOC~~ SOLN
15.0000 [IU] | Freq: Every day | SUBCUTANEOUS | Status: DC
  Filled 2019-06-01: qty 0.15

## 2019-06-01 MED ORDER — GABAPENTIN 300 MG PO CAPS
300.0000 mg | ORAL_CAPSULE | Freq: Two times a day (BID) | ORAL | Status: DC
  Administered 2019-06-01 – 2019-06-07 (×13): 300 mg via ORAL
  Filled 2019-06-01 (×13): qty 1

## 2019-06-01 MED ORDER — HEPARIN SODIUM (PORCINE) 5000 UNIT/ML IJ SOLN
5000.0000 [IU] | Freq: Three times a day (TID) | INTRAMUSCULAR | Status: DC
  Administered 2019-06-01 – 2019-06-07 (×19): 5000 [IU] via SUBCUTANEOUS
  Filled 2019-06-01 (×19): qty 1

## 2019-06-01 MED ORDER — INSULIN ASPART 100 UNIT/ML ~~LOC~~ SOLN
0.0000 [IU] | Freq: Three times a day (TID) | SUBCUTANEOUS | Status: DC
  Administered 2019-06-02: 2 [IU] via SUBCUTANEOUS
  Administered 2019-06-02 (×2): 3 [IU] via SUBCUTANEOUS
  Administered 2019-06-03: 7 [IU] via SUBCUTANEOUS
  Administered 2019-06-03: 18:00:00 1 [IU] via SUBCUTANEOUS
  Administered 2019-06-03 – 2019-06-04 (×2): 3 [IU] via SUBCUTANEOUS
  Administered 2019-06-04: 09:00:00 5 [IU] via SUBCUTANEOUS
  Administered 2019-06-04: 13:00:00 3 [IU] via SUBCUTANEOUS
  Administered 2019-06-05: 2 [IU] via SUBCUTANEOUS
  Administered 2019-06-05: 08:00:00 5 [IU] via SUBCUTANEOUS
  Administered 2019-06-05: 3 [IU] via SUBCUTANEOUS
  Administered 2019-06-06 – 2019-06-07 (×3): 5 [IU] via SUBCUTANEOUS
  Administered 2019-06-07: 3 [IU] via SUBCUTANEOUS

## 2019-06-01 MED ORDER — INSULIN ASPART 100 UNIT/ML ~~LOC~~ SOLN
0.0000 [IU] | Freq: Every day | SUBCUTANEOUS | Status: DC
  Administered 2019-06-04: 3 [IU] via SUBCUTANEOUS
  Administered 2019-06-05 – 2019-06-06 (×2): 2 [IU] via SUBCUTANEOUS

## 2019-06-01 NOTE — Plan of Care (Signed)
  Problem: Activity: Goal: Risk for activity intolerance will decrease 06/01/2019 0501 by Trixie Deis, RN Outcome: Progressing 06/01/2019 0501 by Trixie Deis, RN Outcome: Progressing   Problem: Pain Managment: Goal: General experience of comfort will improve 06/01/2019 0501 by Trixie Deis, RN Outcome: Progressing 06/01/2019 0501 by Trixie Deis, RN Outcome: Progressing   Problem: Safety: Goal: Ability to remain free from injury will improve 06/01/2019 0501 by Trixie Deis, RN Outcome: Progressing 06/01/2019 0501 by Trixie Deis, RN Outcome: Progressing   Problem: Skin Integrity: Goal: Risk for impaired skin integrity will decrease 06/01/2019 0501 by Trixie Deis, RN Outcome: Progressing 06/01/2019 0501 by Trixie Deis, RN Outcome: Progressing

## 2019-06-01 NOTE — Progress Notes (Signed)
PHARMACY CONSULT NOTE FOR:  OUTPATIENT  PARENTERAL ANTIBIOTIC THERAPY (OPAT)  Indication: Osteomyelitis/joint infection  Regimen: cefazolin 2g IV q8h End date: 07/11/2019  IV antibiotic discharge orders are pended. To discharging provider:  please sign these orders via discharge navigator,  Select New Orders & click on the button choice - Manage This Unsigned Work.     Thank you for allowing pharmacy to be a part of this patient's care.  Phillis Haggis 06/01/2019, 11:53 AM

## 2019-06-01 NOTE — Progress Notes (Signed)
Text paged Dr. Nevada Crane, microbiology contacted RN about chest abscess culture. Notified MD to look at results due to rare bacteria. Awaiting interventions.

## 2019-06-01 NOTE — Progress Notes (Signed)
PROGRESS NOTE  Taylor Hughes [id number] DOB: 07/29/1971 DOA: 05/23/2019 PCP: Charlott Rakes, MD  HPI/Recap of past 24 hours: 48 yo male whopresented with fever and left anterior chest wall pain. 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 Patient reported fevers and chest pain for about 2 days. No history of local trauma. On his initial physical examination he was febrile 100.5 F, blood pressure 110/75, heart rate 108, respiratory 24, oxygen saturation 93%, his lungs are clear to auscultation bilaterally, heart S1-S2 present rhythmic, abdomen soft, no lower extremity edema. Tenderness in the left anterior chest wall with positive local induration. 2 opening wounds in the mid upper anterior thorax. Sodium134, potassium 3.6, chloride 98, bicarb 23,glucose 128, BUN 16, creatinine 1.0, lactic acid 2.2, white cell count 8.5, hemoglobin 11.7, hematocrit 35.7, platelets 350. SARSCOVID-19 was negative.Urine analysis specific gravity more than 1.046, 30 protein,glucose 150.CT chest with old hematoma/infected fluid left pectoralis major,collection extending superiorly and medially to the left sternoclavicular joint.  Patient was admitted to the hospital working diagnosis of sepsis due to left pectoralis muscle cellulitis/abscess.  Patient tolerating weill IV antibiotic therapy with Vancomycin and Zosyn, he has remained afebrile with no leukocytosis. Persistent hard collection on his left anterior chest wall.  Transferred to [facility] from [facility] for debridement.   CTS made aware of the patient's arrival at [facility] and will see in consultation.  Seen by CTS with plan for I&D and surgical debridement on Monday, 05/30/2019.  POD #0 post left chest wall abscess I&D with placement of ACell matrix and wound VAC on 05/30/19 by Dr. Kipp Brood.  06/01/19:patient was seen and  examined at his bedside this morning.  No acute events overnight.  Infectious disease consulted to assist with antibiotics in the setting of polymicrobial deep tissue infection with group A strep and MSSA to left chest wall extending down to the joint.   Assessment/Plan: Active Problems:   Essential hypertension   Type 2 diabetes mellitus with other specified complication (HCC)   Sepsis ([place])  POD #2 post left chest wall abscess I&D with placement of ACell matrix and wound VAC on 05/30/19 by Dr. Kipp Brood. Optimize pain control Monitor wound vac fluid output Follow cultures Continue antibiotics Abscess wound culture obtained on 05/24/19.  Grew staph aureus and group A strep pansensitive.   Currently on Ancef Tissue abscess chest wall deep wound Gram stain taken on 05/30/2019 showed no organisms.  Culture showed no growth less than 24. Deep wound abscess pectoralis taken on 05/30/2019 showed few gram-positive cocci in pairs.  Culture reintubated for better growth. ID consulted on 06/01/19. Appreciate assistance.  Polymicrobial deep tissue infection with croup a strep and MSSA to left chest wall extended down to the joint Infectious disease recommends 6 weeks of IV cefazolin, starting date date 05/30/2019 Open orders PICC line placement Will follow-up with CTS outpatient  Sepsis secondary to left pectoralis muscle cellulitis/abscess  Presented with fever, tachycardia and tachypnea CT chest with contrast done on 05/31/2019 showed: There is a 5.6 x 2.8 cm ill-defined low attenuating collection in the left pectoralis major which may represent an old hematoma or infected fluid. This appears to extend more superiorly and medially to the left sternoclavicular joint. There is irregularity and fragmentation of the left sternoclavicular joint which may represent an infectious process. Initially was seen by general surgery, signed off on 05/26/2019 Transferred to [facility] on 05/26/2019 for possible  debridement by CTS. Continue broad-spectrum antibiotics IV vancomycin and IV Zosyn empirically. Sepsis physiology is resolving on IV antibiotics, T-max 99.4 Blood cultures x2 drawn peripherally taken on 05/23/2019 no growth final. Aerobic culture taken from superficial specimen, wound abscess taken on 05/24/2019: Rare Staphylococcus aureus and rare group A strep.  Pansensitive. MRSA screening negative. Continue to maximize pain control Post I&D and surgical debridement on Monday, 05/30/2019 by CTS.  Microbiology data as stated above  Chronic bilateral lower extremity edema We will order compression stockings Last 2D echo on 07/30/2018 showed LVEF 65 to 70%  Polyneuropathy Started on gabapentin 300 mg BID  Improving AKI, suspect prerenal secondary to poor oral intake Baseline creatinine appears to be 0.9 with GFR greater than 60 Creatinine 2.42 on 05/29/2019 with GFR of 36 Creatinine 2.53 with GFR of 34 on 05/30/2019 Creatinine 2.2 with GFR of 39 on 05/31/2019 Cr 1.96 on 06/01/19 with GFR46  1.3 L urine output recorded in the last 24 hours BMP in the am  Health screening HIV screening negative 2019  Resolving Non anion gap metabolic acidosis likely secondary to acute renal failure Chemistry bicarb 21 and anion gap of 11 on 05/30/2019 Chemistry bicarb 24 and AG 9 on 06/01/19 Continue to closely monitor if not improved consider bicarb replacement C/w p.o. bicarb replacement x 1 day  Transient hypoglycemia likely secondary to poor oral intake Hold off long-acting insulin to avoid hypoglycemia Continue insulin sliding scale as needed Goal CBG between 140 and 180  Insulin-dependent type 2 diabetes with hyperglycemia Hemoglobin A1c 11.4 on 11/25/2018 Hemoglobin A1c 12.7 on 05/27/2019. Continue insulin coverage Avoid hyperglycemia Goal blood sugar between 140 and 180  Mild acute blood loss anemia Hemoglobin stable 10.7 from 10.0 on 05/29/2019 No sign of overt  bleeding  Suspected situational anxiety No prior history of chronic anxiety or psych illness  Resolved intractable nausea and vomiting, unclear etiology  Chronic diastolic CHF Last 2D echo done on 07/30/2018 showed LVEF 65 to 70% Start strict I's and O's and daily weight Monitor volume status Continue metoprolol  Chronic normocytic anemia Hemoglobin appears to be at his baseline 10.4 with MCV of 91 on 05/28/2019 No sign of overt bleeding Drop H&H 10.0 on 05/29/2019. Continue to monitor H&H  Obesity BMI 36 Recommend weight loss outpatient with regular physical activity and healthy dieting  Essential hypertension Blood pressure is currently at goal. Continue metoprolol at home dose Continue to monitor vital signs  DVT prophylaxis:Subcu Heparin 5000 U 3 times daily.  Code Status:full Family Communication:no family at the bedside Disposition Plan: Patient is currently not appropriate for discharge at this time due to ongoing treatment for pectoralis abscess, possibly requiring debridement by cardiothoracic surgery.  Will discharge to home when CTS signs off and patient is hemodynamically stable.  Body mass index is 36.26 kg/m.    Consultants:   Surgery   CT surgery  ID   Procedures:     Antimicrobials:   Ancef   Objective: Vitals:   05/31/19 2248 06/01/19 0500 06/01/19 0755 06/01/19 0757  BP: 138/85 136/72 (!) 157/93 (!) 156/89  Pulse: 87 79 84 85  Resp:  16 16   Temp:  98.2 F (36.8 C) 98.6 F (37 C)   TempSrc:  Oral Oral   SpO2:  100% 100%   Weight:      Height:        Intake/Output Summary (Last 24 hours) at 06/01/2019 1328 Last data filed at 06/01/2019 1000 Gross per 24 hour  Intake 720  ml  Output 1850 ml  Net -1130 ml   Filed Weights   05/28/19 0417 05/29/19 0500 05/30/19 0416  Weight: 117 kg 117.3 kg 117.8 kg    Exam:   General: 47 y.o. year-old male WD WN NAD A&O x 4  Cardiovascular: RRR no rubs or gallops.  Chest  with wound VAC present.    Respiratory: CTA no wheezes or rales  Abdomen:sot NT Nd NBS  Musculoskeletal: 2+ pitting edema in LE bilaterally.  Psychiatry: Mood is appropriate for condition and setting.   Data Reviewed: CBC: Recent Labs  Lab 05/26/19 0431 05/29/19 0442 05/31/19 0743  WBC 7.0 7.1 6.6  NEUTROABS 4.8 5.4  --   HGB 10.4* 10.0* 10.7*  HCT 32.8* 30.6* 31.8*  MCV 91.9 89.0 88.6  PLT 310 365 [id number]*   Basic Metabolic Panel: Recent Labs  Lab 05/26/19 0431 05/28/19 0239 05/29/19 0442 05/30/19 0229 05/31/19 0743 06/01/19 0245  NA 133*  --  138 137 136 138  K 3.5  --  3.8 4.0 4.3 4.4  CL 104  --  106 105 101 105  CO2 21*  --  22 21* 21* 24  GLUCOSE 240*  --  171* 241* 285* 124*  BUN 8  --  8 9 14 14   CREATININE 0.92 1.09 2.42* 2.53* 2.25* 1.96*  CALCIUM 8.0*  --  8.2* 8.0* 8.4* 8.6*  MG  --   --  1.8  --   --   --   PHOS  --   --  3.5  --   --   --    GFR: Estimated Creatinine Clearance: 60.8 mL/min (A) (by C-G formula based on SCr of 1.96 mg/dL (H)). Liver Function Tests: Recent Labs  Lab 05/29/19 0442  AST 16  ALT 8  ALKPHOS 65  BILITOT 0.6  PROT 7.2  ALBUMIN 2.0*   No results for input(s): LIPASE, AMYLASE in the last 168 hours. No results for input(s): AMMONIA in the last 168 hours. Coagulation Profile: Recent Labs  Lab 05/30/19 0229  INR 1.2   Cardiac Enzymes: No results for input(s): CKTOTAL, CKMB, CKMBINDEX, TROPONINI in the last 168 hours. BNP (last 3 results) No results for input(s): PROBNP in the last 8760 hours. HbA1C: No results for input(s): HGBA1C in the last 72 hours. CBG: Recent Labs  Lab 05/31/19 1156 05/31/19 1625 05/31/19 2202 06/01/19 0632 06/01/19 1129  GLUCAP 222* 256* 273* 134* 191*   Lipid Profile: No results for input(s): CHOL, HDL, LDLCALC, TRIG, CHOLHDL, LDLDIRECT in the last 72 hours. Thyroid Function Tests: No results for input(s): TSH, T4TOTAL, FREET4, T3FREE, THYROIDAB in the last 72 hours. Anemia  Panel: No results for input(s): VITAMINB12, FOLATE, FERRITIN, TIBC, IRON, RETICCTPCT in the last 72 hours. Urine analysis:    Component Value Date/Time   COLORURINE YELLOW 05/24/2019 0106   APPEARANCEUR HAZY (A) 05/24/2019 0106   LABSPEC >1.046 (H) 05/24/2019 0106   PHURINE 5.0 05/24/2019 0106   GLUCOSEU 150 (A) 05/24/2019 0106   HGBUR SMALL (A) 05/24/2019 0106   BILIRUBINUR NEGATIVE 05/24/2019 0106   KETONESUR 20 (A) 05/24/2019 0106   PROTEINUR 30 (A) 05/24/2019 0106   NITRITE NEGATIVE 05/24/2019 0106   LEUKOCYTESUR NEGATIVE 05/24/2019 0106   Sepsis Labs: @LABRCNTIP (procalcitonin:4,lacticidven:4)  ) Recent Results (from the past 240 hour(s))  Culture, blood (routine x 2)     Status: None   Collection Time: 05/23/19  9:33 PM   Specimen: BLOOD  Result Value Ref Range Status   Specimen Description  Final    BLOOD BLOOD LEFT HAND Performed at [facility] [address], [place], [place] [address]    Special Requests   Final    BOTTLES DRAWN AEROBIC AND ANAEROBIC Blood Culture adequate volume Performed at [place] [address] [place], [place] [address]    Culture   Final    NO GROWTH 5 DAYS Performed at [facility], [place] [address] [place], [place] [address]    Report Status 05/28/2019 FINAL  Final  Culture, blood (routine x 2)     Status: None   Collection Time: 05/23/19  9:33 PM   Specimen: BLOOD  Result Value Ref Range Status   Specimen Description   Final    BLOOD BLOOD RIGHT FOREARM Performed at [facility] [address], [place], [place] [address]    Special Requests   Final    BOTTLES DRAWN AEROBIC ONLY Blood Culture results may not be optimal due to an inadequate volume of blood received in culture bottles Performed at [facility] [address] [place], [place] [address]    Culture   Final    NO GROWTH 5 DAYS Performed at [facility], [place] [address], [place], [place] [address]    Report Status 05/28/2019 FINAL  Final  SARS Coronavirus 2 by RT PCR (hospital order, performed in [facility] hospital lab) Nasopharyngeal Nasopharyngeal Swab     Status: None   Collection Time: 05/24/19 12:26 AM   Specimen: Nasopharyngeal Swab  Result Value Ref Range Status   SARS Coronavirus 2 NEGATIVE NEGATIVE Final    Comment: (NOTE) If result is NEGATIVE SARS-CoV-2 target nucleic acids are NOT DETECTED. The SARS-CoV-2 RNA is generally detectable in upper and lower  respiratory specimens during the acute phase of infection. The lowest  concentration of SARS-CoV-2 viral copies this assay can detect is 250  copies / mL. A negative result does not preclude SARS-CoV-2 infection  and should not be used as the sole basis for treatment or other  patient management decisions.  A negative result may occur with  improper specimen collection / handling, submission of specimen other  than nasopharyngeal swab, presence of viral mutation(s) within the  areas targeted by this assay, and inadequate number of viral copies  (<250 copies / mL). A negative result must be combined with clinical  observations, patient history, and epidemiological information. If result is POSITIVE SARS-CoV-2 target nucleic acids are DETECTED. The SARS-CoV-2 RNA is generally detectable in upper and lower  respiratory specimens dur ing the acute phase of infection.  Positive  results are indicative of active infection with SARS-CoV-2.  Clinical  correlation with patient history and other diagnostic information is  necessary to determine patient infection status.  Positive results do  not rule out bacterial infection or co-infection with other viruses. If result is PRESUMPTIVE POSTIVE SARS-CoV-2 nucleic acids MAY BE PRESENT.   A presumptive positive result was obtained on the submitted specimen  and confirmed on repeat testing.  While 2019 novel coronavirus  (SARS-CoV-2) nucleic acids may  be present in the submitted sample  additional confirmatory testing may be necessary for epidemiological  and / or clinical management purposes  to differentiate between  SARS-CoV-2 and other Sarbecovirus currently known to infect humans.  If clinically indicated additional testing with an alternate test  methodology [phone]) is advised. The SARS-CoV-2 RNA is generally  detectable in upper and lower respiratory sp ecimens during the  acute  phase of infection. The expected result is Negative. Fact Sheet for Patients:  StrictlyIdeas.no Fact Sheet for Healthcare Providers: BankingDealers.co.za This test is not yet approved or cleared by the [place] FDA and has been authorized for detection and/or diagnosis of SARS-CoV-2 by FDA under an Emergency Use Authorization (EUA).  This EUA will remain in effect (meaning this test can be used) for the duration of the COVID-19 declaration under Section 564(b)(1) of the Act, 21 U.S.C. section 360bbb-3(b)(1), unless the authorization is terminated or revoked sooner. Performed at [facility], [place] [address], [place], [place] [address]   Aerobic Culture (superficial specimen)     Status: None   Collection Time: 05/24/19  8:05 AM   Specimen: Abscess; Wound  Result Value Ref Range Status   Specimen Description   Final    ABSCESS CHEST Performed at [facility] [address], [place], [place] [address]    Special Requests   Final    NONE Performed at [facility], [place] [address] [place], [place] [address]    Gram Stain   Final    FEW WBC PRESENT, PREDOMINANTLY PMN FEW GRAM POSITIVE COCCI IN CLUSTERS Performed at [facility], [place] [address], [place], [place] [address]    Culture   Final    RARE STAPHYLOCOCCUS AUREUS RARE GROUP A STREP (S.PYOGENES) ISOLATED    Report Status 05/28/2019 FINAL  Final   Organism ID, Bacteria  STAPHYLOCOCCUS AUREUS  Final      Susceptibility   Staphylococcus aureus - MIC*    CIPROFLOXACIN <=0.5 SENSITIVE Sensitive     ERYTHROMYCIN <=0.25 SENSITIVE Sensitive     GENTAMICIN <=0.5 SENSITIVE Sensitive     OXACILLIN 0.5 SENSITIVE Sensitive     TETRACYCLINE <=1 SENSITIVE Sensitive     VANCOMYCIN <=0.5 SENSITIVE Sensitive     TRIMETH/SULFA <=10 SENSITIVE Sensitive     CLINDAMYCIN <=0.25 SENSITIVE Sensitive     RIFAMPIN <=0.5 SENSITIVE Sensitive     Inducible Clindamycin NEGATIVE Sensitive     * RARE STAPHYLOCOCCUS AUREUS  MRSA PCR Screening     Status: None   Collection Time: 05/27/19  1:00 PM   Specimen: Nasal Mucosa; Nasopharyngeal  Result Value Ref Range Status   MRSA by PCR NEGATIVE NEGATIVE Final    Comment:        The GeneXpert MRSA Assay (FDA approved for NASAL specimens only), is one component of a comprehensive MRSA colonization surveillance program. It is not intended to diagnose MRSA infection nor to guide or monitor treatment for MRSA infections. Performed at [facility], [place] [address] [place], [place] [address]   Surgical pcr screen     Status: None   Collection Time: 05/30/19  1:34 AM   Specimen: Nasal Mucosa; Nasal Swab  Result Value Ref Range Status   MRSA, PCR NEGATIVE NEGATIVE Final   Staphylococcus aureus NEGATIVE NEGATIVE Final    Comment: (NOTE) The Xpert SA Assay (FDA approved for NASAL specimens in patients 28 years of age and older), is one component of a comprehensive surveillance program. It is not intended to diagnose infection nor to guide or monitor treatment. Performed at [facility], [place] [address], [place], [place] [address]   Aerobic/Anaerobic Culture (surgical/deep wound)     Status: None (Preliminary result)   Collection Time: 05/30/19 10:19 AM   Specimen: PATH Soft tissue  Result Value Ref Range Status   Specimen Description TISSUE ABSCESS CHEST WALL  Final   Special Requests A  Final   Gram Stain   Final     RARE WBC PRESENT, PREDOMINANTLY PMN NO ORGANISMS SEEN Performed at [facility], [place] [address], [place], [place] [address]    Culture   Final    CULTURE REINCUBATED FOR BETTER GROWTH NO ANAEROBES ISOLATED; CULTURE IN PROGRESS FOR 5 DAYS    Report Status PENDING  Incomplete  Aerobic/Anaerobic Culture (surgical/deep wound)     Status: None (Preliminary result)   Collection Time: 05/30/19 10:34 AM   Specimen: PATH Other; Body Fluid  Result Value Ref Range Status   Specimen Description ABSCESS PECTORALIS  Final   Special Requests B  Final   Gram Stain   Final    MODERATE WBC PRESENT, PREDOMINANTLY PMN FEW GRAM POSITIVE COCCI IN PAIRS Performed at [facility], [address] [place], [place] [address]    Culture   Final    MODERATE STAPHYLOCOCCUS AUREUS SUSCEPTIBILITIES TO FOLLOW NO ANAEROBES ISOLATED; CULTURE IN PROGRESS FOR 5 DAYS    Report Status PENDING  Incomplete      Studies: [place] Ekg Site Rite  Result Date: 06/01/2019 If Site Rite image not attached, placement could not be confirmed due to current cardiac rhythm.   Scheduled Meds:  gabapentin  300 mg Oral BID   heparin injection (subcutaneous)  5,000 Units Subcutaneous Q8H   insulin aspart  0-20 Units Subcutaneous TID WC   insulin aspart  0-5 Units Subcutaneous QHS   insulin aspart  4 Units Subcutaneous TID WC   insulin detemir  25 Units Subcutaneous Daily   metoprolol tartrate  50 mg Oral BID   senna-docusate  2 tablet Oral BID   sodium bicarbonate  1,300 mg Oral BID    Continuous Infusions:   ceFAZolin (ANCEF) IV 2 g (06/01/19 0532)     LOS: 8 days     Taylor Memos, MD [facility] Pager [phone]  If 7PM-7AM, please contact night-coverage [email or web address] Password [email or web address] 06/01/2019, 1:28 PM

## 2019-06-01 NOTE — Evaluation (Signed)
Physical Therapy Evaluation Patient Details Name: Taylor Hughes MRN: [id number] DOB: 10/07/1970 Today's Date: 06/01/2019   History of Present Illness  Pt is a pleasant 48 yo male presenting s/p I&D and placement of wound VAC of left pec/chest due to ongoing infefction. 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  Clinical Impression  Pt found in bed upon PT arrival, eager and agreeable to PT session. Pt was able to complete all bed mobility without assist (supervision for management of wound VAC), and then completed multiple transfers (sit-stand) and ambulation (19ft) with supervision and no use of AD. The patient ambulates with a gait speed of 0.48m/s and uses a wide BOS with increased lateral sway, especially as he fatigues. A gait speed less than 0.6m/s indicates increased risk of falls and dependence in ADLs. The patient will therefore continue to benefit from skilled PT to address limitations in functional endurance and mobility both in-house as well as HHPT or OPPT depending on coverage to address underlying endurance and balance concerns related to previous amputations.       Follow Up Recommendations Home health PT;Other (comment)(OPPT if HHPT not covered)    Equipment Recommendations       Recommendations for Other Services       Precautions / Restrictions Precautions Precautions: None Restrictions Weight Bearing Restrictions: No      Mobility  Bed Mobility Overal bed mobility: Independent             General bed mobility comments: Pt able to complete all bed mobility independently, assist only for wound vac management  Transfers Overall transfer level: Needs assistance Equipment used: None Transfers: Sit to/from Stand Sit to Stand: Supervision         General transfer comment: Pt is able to complete transfers without assist, and although demos slight unsteadiness, is able to maintain balance without  issue, supervision for line management.  Ambulation/Gait   Gait Distance (Feet): 200 Feet Assistive device: None Gait Pattern/deviations: Step-through pattern;Wide base of support Gait velocity: 0.26m/s Gait velocity interpretation: <1.31 ft/sec, indicative of household ambulator General Gait Details: Pt intially with steady, step-through gait that deteriorated to shuffle with wide base of support. Pt reports BORG RPE of 7/10 after 150 ft.  Stairs            Wheelchair Mobility    Modified Rankin (Stroke Patients Only)       Balance Overall balance assessment: Needs assistance Sitting-balance support: No upper extremity supported;Feet supported Sitting balance-Leahy Scale: Good     Standing balance support: No upper extremity supported Standing balance-Leahy Scale: Fair Standing balance comment: Pt is able to demo standing and amb without AD or UE support, but is "wobbly" and demos increased lateral sway                             Pertinent Vitals/Pain Pain Assessment: 0-10 Pain Score: 6  Pain Descriptors / Indicators: Sore Pain Intervention(s): Repositioned;Monitored during session    [place] expects to be discharged to:: Private residence Living Arrangements: (at friends house) Available Help at Discharge: Friend(s)(Pt lives with his friend who received home dialysis, he is primary caretaker for her but she is home to assist him as needed) Type of Home: House Home Access: Stairs to enter Entrance Stairs-Rails: None Entrance Stairs-Number of Steps: 6(side door with no steps, uses front door more often) Home Layout: One level  Home Equipment: [place] - 4 wheels;Cane - quad      Prior Function Level of Independence: Independent         Comments: Pt reports some balance issues after amputations, reports no prolonged use of AD, but uses occasional "rolling chair" and chair in shower for comfort.     Hand Dominance   Dominant  Hand: Left    Extremity/Trunk Assessment   Upper Extremity Assessment Upper Extremity Assessment: Overall WFL for tasks assessed    Lower Extremity Assessment Lower Extremity Assessment: Overall WFL for tasks assessed    Cervical / Trunk Assessment Cervical / Trunk Assessment: Normal  Communication   Communication: No difficulties  Cognition Arousal/Alertness: Awake/alert Behavior During Therapy: WFL for tasks assessed/performed Overall Cognitive Status: Within Functional Limits for tasks assessed                                        General Comments      Exercises     Assessment/Plan    PT Assessment Patient needs continued PT services  PT Problem List Decreased mobility;Obesity;Decreased activity tolerance;Decreased balance       PT Treatment Interventions Gait training;Therapeutic activities;Therapeutic exercise;Stair training;Balance training;Functional mobility training    PT Goals (Current goals can be found in the Care Plan section)  Acute Rehab PT Goals Patient Stated Goal: return home PT Goal Formulation: With patient Time For Goal Achievement: 06/15/19 Potential to Achieve Goals: Good    Frequency Min 2X/week   Barriers to discharge        Co-evaluation               AM-PAC PT "6 Clicks" Mobility  Outcome Measure Help needed turning from your back to your side while in a flat bed without using bedrails?: None Help needed moving from lying on your back to sitting on the side of a flat bed without using bedrails?: None Help needed moving to and from a bed to a chair (including a wheelchair)?: A Little Help needed standing up from a chair using your arms (e.g., wheelchair or bedside chair)?: None Help needed to walk in hospital room?: A Little Help needed climbing 3-5 steps with a railing? : A Little 6 Click Score: 21    End of Session   Activity Tolerance: Patient tolerated treatment well;No increased pain Patient left:  in chair;with call bell/phone within reach Nurse Communication: Mobility status PT Visit Diagnosis: Unsteadiness on feet (R26.81);Difficulty in walking, not elsewhere classified (R26.2)    Time: [id number] PT Time Calculation (min) (ACUTE ONLY): 33 min   Charges:   PT Evaluation $PT Eval Low Complexity: 1 Low PT Treatments $Gait Training: 8-22 mins        Mickey Farber, PT, DPT   Acute Rehabilitation Department [phone]

## 2019-06-01 NOTE — Consult Note (Signed)
[facility] for Infectious Disease  Total days of antibiotics 10/cefazolin day 5               Reason for Consult: MSSA [place] joint infection/deep tissue infection    Referring Physician: hall  Active Problems:   Essential hypertension   Type 2 diabetes mellitus with other specified complication ([place])   Sepsis ([place])    HPI: Taylor Hughes is a 48 y.o. male with T2DM, HTN, who was admitted for 10/12 with 2 day history of chills, fevers, fatigue, swelling to left anterior chest wall pain. He noted some purulent drainage from scab pulled off to chest wall, interestingly he has had hx of  chest wall necrotizing fasciitis April 2020, status post debridement. No history of local trauma. On admit had fevers of up to 101F, WBC of 9.8. initially started on vanco/piptazo. Imaging showed There is a 5.6 x 2.8 cm ill-defined low attenuating collection in the left pectoralis major which may represent an old hematoma or infected fluid. This appears to extend more superiorly and medially to the left sternoclavicular joint. He was taken to the OR on 10/19 for Ix D, 14 cm long and 5 cm at its deepest point and tracked to [facility] joint. Bone thought to have good integrity. Admit cx showed group a strep and mssa. No blood cx were positive.  Past Medical History:  Diagnosis Date  . Atrial fibrillation ([place])   . Diabetes mellitus without complication (HCC)    Type 2  . GERD (gastroesophageal reflux disease)   . Wears glasses   . Wound, open, foot    left diabetic     Allergies: No Known Allergies   MEDICATIONS: . gabapentin  300 mg Oral BID  . heparin injection (subcutaneous)  5,000 Units Subcutaneous Q8H  . insulin aspart  0-20 Units Subcutaneous TID WC  . insulin aspart  0-5 Units Subcutaneous QHS  . insulin aspart  4 Units Subcutaneous TID WC  . insulin detemir  25 Units Subcutaneous Daily  . metoprolol tartrate  50 mg Oral BID  . senna-docusate  2 tablet Oral BID  . sodium bicarbonate  1,300 mg  Oral BID    Social History   Tobacco Use  . Smoking status: Never Smoker  . Smokeless tobacco: Never Used  Substance Use Topics  . Alcohol use: No    Alcohol/week: 0.0 standard drinks  . Drug use: No   - works as a Biomedical scientist at [employer] Family History  Problem Relation Age of Onset  . Diabetes Mother    Review of Systems  Constitutional: Negative for fever, chills, diaphoresis, activity change, appetite change, fatigue and unexpected weight change.  HENT: Negative for congestion, sore throat, rhinorrhea, sneezing, trouble swallowing and sinus pressure.  Eyes: Negative for photophobia and visual disturbance.  Respiratory: Negative for cough, chest tightness, shortness of breath, wheezing and stridor.  Cardiovascular: Negative for chest pain, palpitations and leg swelling.  Gastrointestinal: Negative for nausea, vomiting, abdominal pain, diarrhea, constipation, blood in stool, abdominal distention and anal bleeding.  Genitourinary: Negative for dysuria, hematuria, flank pain and difficulty urinating.  Musculoskeletal: Negative for myalgias, back pain, joint swelling, arthralgias and gait problem.  Skin: Negative for color change, pallor, rash and wound.  Ext: swelling of lower extremities when standing for prolonged period of time Neurological: Negative for dizziness, tremors, weakness and light-headedness.  Hematological: Negative for adenopathy. Does not bruise/bleed easily.  Psychiatric/Behavioral: Negative for behavioral problems, confusion, sleep disturbance, dysphoric mood, decreased concentration and  agitation.     OBJECTIVE: Temp:  [98 F (36.7 C)-98.6 F (37 C)] 98.6 F (37 C) (10/21 0755) Pulse Rate:  [75-91] 85 (10/21 0757) Resp:  [16-18] 16 (10/21 0755) BP: (128-157)/(72-93) 156/89 (10/21 0757) SpO2:  [96 %-100 %] 100 % (10/21 0755) Physical Exam  Constitutional: He is oriented to person, place, and time. He appears well-developed and well-nourished. No distress.   HENT:  Mouth/Throat: Oropharynx is clear and moist. No oropharyngeal exudate.  Cardiovascular: Normal rate, regular rhythm and normal heart sounds. Exam reveals no gallop and no friction rub.  No murmur heard.  Chest wall: wound vac in place left upper chest wall Pulmonary/Chest: Effort normal and breath sounds normal. No respiratory distress. He has no wheezes.  Abdominal: Soft. Bowel sounds are normal. He exhibits no distension. There is no tenderness.  Lymphadenopathy:  He has no cervical adenopathy.  Neurological: He is alert and oriented to person, place, and time.  Skin: Skin is warm and dry. No rash noted. No erythema.  Psychiatric: He has a normal mood and affect. His behavior is normal.     LABS: Results for orders placed or performed during the hospital encounter of 05/23/19 (from the past 48 hour(s))  Glucose, capillary     Status: Abnormal   Collection Time: 05/30/19  5:24 PM  Result Value Ref Range   Glucose-Capillary 239 (H) 70 - 99 mg/dL  Glucose, capillary     Status: Abnormal   Collection Time: 05/30/19  9:13 PM  Result Value Ref Range   Glucose-Capillary 155 (H) 70 - 99 mg/dL  Glucose, capillary     Status: Abnormal   Collection Time: 05/31/19  6:59 AM  Result Value Ref Range   Glucose-Capillary 270 (H) 70 - 99 mg/dL  CBC     Status: Abnormal   Collection Time: 05/31/19  7:43 AM  Result Value Ref Range   WBC 6.6 4.0 - 10.5 K/uL   RBC 3.59 (L) 4.22 - 5.81 MIL/uL   Hemoglobin 10.7 (L) 13.0 - 17.0 g/dL   HCT 31.8 (L) 39.0 - 52.0 %   MCV 88.6 80.0 - 100.0 fL   MCH 29.8 26.0 - 34.0 pg   MCHC 33.6 30.0 - 36.0 g/dL   RDW 14.6 11.5 - 15.5 %   Platelets 409 (H) 150 - 400 K/uL   nRBC 0.0 0.0 - 0.2 %    Comment: Performed at [facility], [address], [place], [place] [id number]  Basic metabolic panel     Status: Abnormal   Collection Time: 05/31/19  7:43 AM  Result Value Ref Range   Sodium 136 135 - 145 mmol/L   Potassium 4.3 3.5 - 5.1 mmol/L    Chloride 101 98 - 111 mmol/L   CO2 21 (L) 22 - 32 mmol/L   Glucose, Bld 285 (H) 70 - 99 mg/dL   BUN 14 6 - 20 mg/dL   Creatinine, Ser 2.25 (H) 0.61 - 1.24 mg/dL   Calcium 8.4 (L) 8.9 - 10.3 mg/dL   GFR calc non Af Amer 33 (L) >60 mL/min   GFR calc Af Amer 39 (L) >60 mL/min   Anion gap 14 5 - 15    Comment: Performed at [facility] [address] [place], [place] [address]  Glucose, capillary     Status: Abnormal   Collection Time: 05/31/19 11:56 AM  Result Value Ref Range   Glucose-Capillary 222 (H) 70 - 99 mg/dL  Glucose, capillary  Status: Abnormal   Collection Time: 05/31/19  4:25 PM  Result Value Ref Range   Glucose-Capillary 256 (H) 70 - 99 mg/dL  Glucose, capillary     Status: Abnormal   Collection Time: 05/31/19 10:02 PM  Result Value Ref Range   Glucose-Capillary 273 (H) 70 - 99 mg/dL  Basic metabolic panel     Status: Abnormal   Collection Time: 06/01/19  2:45 AM  Result Value Ref Range   Sodium 138 135 - 145 mmol/L   Potassium 4.4 3.5 - 5.1 mmol/L   Chloride 105 98 - 111 mmol/L   CO2 24 22 - 32 mmol/L   Glucose, Bld 124 (H) 70 - 99 mg/dL   BUN 14 6 - 20 mg/dL   Creatinine, Ser 1.96 (H) 0.61 - 1.24 mg/dL   Calcium 8.6 (L) 8.9 - 10.3 mg/dL   GFR calc non Af Amer 40 (L) >60 mL/min   GFR calc Af Amer 46 (L) >60 mL/min   Anion gap 9 5 - 15    Comment: Performed at [facility], [place] [address], [place], [place] [address]  Glucose, capillary     Status: Abnormal   Collection Time: 06/01/19  6:32 AM  Result Value Ref Range   Glucose-Capillary 134 (H) 70 - 99 mg/dL  Glucose, capillary     Status: Abnormal   Collection Time: 06/01/19 11:29 AM  Result Value Ref Range   Glucose-Capillary 191 (H) 70 - 99 mg/dL    MICRO: reviewed  Assessment/Plan:  Polymicrobial deep tissue infection with group A strep and MSSA to left chest wall extending down to joint - recommend 6 wk of IV cefazolin using 10/19 as day1, will place opat orders - will need picc line  and weekly labs and protocol for maintenance - defer wound vac orders to surgery team  T2DM = recommend good control  Hx of swelling lower extremities = defer to primary team for work up, likely will need compression stockings to see if any benefit.consider cardiac work up as outpatient once completes iv therapy  Health screening = recommend to check hiv ab and hep c ab.  Will follow up in the ID clinic in 4-6 wk

## 2019-06-01 NOTE — Plan of Care (Signed)
  Problem: Clinical Measurements: Goal: Will remain free from infection Outcome: Progressing   Problem: Pain Managment: Goal: General experience of comfort will improve Outcome: Progressing   Problem: Safety: Goal: Ability to remain free from injury will improve Outcome: Progressing   Problem: Skin Integrity: Goal: Risk for impaired skin integrity will decrease Outcome: Progressing   

## 2019-06-01 NOTE — Progress Notes (Signed)
Dr. [place] Crane paged about blood sugar, CBG 58, following hypoglycemic protocol. Will recheck CBG in 15 mins

## 2019-06-01 NOTE — Progress Notes (Signed)
Arrived to discuss PICC placement with patient. Patient states he would like to wait until the morning. Primary RN notified.

## 2019-06-01 NOTE — Evaluation (Signed)
Occupational Therapy Evaluation Patient Details Name: Taylor Hughes MRN: [id number] DOB: [birth year] Today's Date: 06/01/2019    History of Present Illness Pt is a pleasant 48 yo male presenting s/p I&D and placement of wound VAC of left pec/chest due to ongoing infection. 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   Clinical Impression   Pt is functioning modified independent in ADL and ADL transfers. He would benefit from a shower seat and 3 in 1 at home. Pt voicing interest in learning how to manage his diet and reports he needs testing needles and strips to monitor his blood sugar, MD and case manager notified. No further OT needs.    Follow Up Recommendations  No OT follow up    Equipment Recommendations  Tub/shower seat;3 in 1 bedside commode    Recommendations for Other Services       Precautions / Restrictions Precautions Precautions: None Restrictions Weight Bearing Restrictions: No      Mobility Bed Mobility Overal bed mobility: Independent                Transfers   Equipment used: None Transfers: Sit to/from Stand Sit to Stand: Modified independent (Device/Increase time)         General transfer comment: slow to rise, no assist    Balance     Sitting balance-Leahy Scale: Good       Standing balance-Leahy Scale: Fair Standing balance comment: able to ambulate and stand at sink without UE support                           ADL either performed or assessed with clinical judgement   ADL Overall ADL's : Modified independent                                       General ADL Comments: Pt could uses a rolling chair as a shower seat and reports difficulty rising from his standard toilet, recommending 3 in 1 and shower seat.     Vision Patient Visual Report: No change from baseline       Perception     Praxis      Pertinent Vitals/Pain Pain  Assessment: Faces Faces Pain Scale: Hurts little more Pain Location: B LEs Pain Descriptors / Indicators: Sore Pain Intervention(s): Monitored during session;Repositioned     Hand Dominance Left   Extremity/Trunk Assessment Upper Extremity Assessment Upper Extremity Assessment: Overall WFL for tasks assessed   Lower Extremity Assessment Lower Extremity Assessment: Defer to PT evaluation   Cervical / Trunk Assessment Cervical / Trunk Assessment: Normal   Communication Communication Communication: No difficulties   Cognition Arousal/Alertness: Awake/alert Behavior During Therapy: WFL for tasks assessed/performed Overall Cognitive Status: Within Functional Limits for tasks assessed                                     General Comments       Exercises     Shoulder Instructions      Home Living Family/patient expects to be discharged to:: Private residence Living Arrangements: Non-relatives/Friends Available Help at Discharge: Friend(s) Type of Home: House Home Access: Stairs to enter Technical brewer of Steps: 6 Entrance Stairs-Rails: None Home Layout: One level  Bathroom Shower/Tub: Teacher, early years/pre: Standard     Home Equipment: Environmental consultant - 4 wheels;Cane - quad          Prior Functioning/Environment Level of Independence: Independent        Comments: Pt reports some balance issues after amputations, reports no prolonged use of AD, but uses occasional "rolling chair" and chair in shower for comfort.        OT Problem List: Impaired balance (sitting and/or standing);Pain      OT Treatment/Interventions:      OT Goals(Current goals can be found in the care plan section) Acute Rehab OT Goals Patient Stated Goal: return home  OT Frequency:     Barriers to D/C:            Co-evaluation              AM-PAC OT "6 Clicks" Daily Activity     Outcome Measure Help from another person eating meals?:  None Help from another person taking care of personal grooming?: None Help from another person toileting, which includes using toliet, bedpan, or urinal?: None Help from another person bathing (including washing, rinsing, drying)?: None Help from another person to put on and taking off regular upper body clothing?: None Help from another person to put on and taking off regular lower body clothing?: None 6 Click Score: 24   End of Session    Activity Tolerance: Patient tolerated treatment well Patient left: in bed;with call bell/phone within reach;with nursing/sitter in room  OT Visit Diagnosis: Unsteadiness on feet (R26.81);Other abnormalities of gait and mobility (R26.89);Pain                Time: 1430-1452 OT Time Calculation (min): 22 min Charges:  OT General Charges $OT Visit: 1 Visit OT Evaluation $OT Eval Moderate Complexity: 1 Mod  Nestor Lewandowsky, OTR/L Acute Rehabilitation Services Pager: [phone] Office: [phone]  Malka So 06/01/2019, 3:02 PM

## 2019-06-02 LAB — GLUCOSE, CAPILLARY
Glucose-Capillary: 211 mg/dL — ABNORMAL HIGH (ref 70–99)
Glucose-Capillary: 226 mg/dL — ABNORMAL HIGH (ref 70–99)
Glucose-Capillary: 176 mg/dL — ABNORMAL HIGH (ref 70–99)
Glucose-Capillary: 169 mg/dL — ABNORMAL HIGH (ref 70–99)

## 2019-06-02 LAB — BASIC METABOLIC PANEL
Anion gap: 10 (ref 5–15)
BUN: 13 mg/dL (ref 6–20)
CO2: 24 mmol/L (ref 22–32)
Calcium: 8.2 mg/dL — ABNORMAL LOW (ref 8.9–10.3)
Chloride: 104 mmol/L (ref 98–111)
Creatinine, Ser: 1.59 mg/dL — ABNORMAL HIGH (ref 0.61–1.24)
GFR calc Af Amer: 59 mL/min — ABNORMAL LOW (ref >60)
GFR calc non Af Amer: 51 mL/min — ABNORMAL LOW (ref >60)
Glucose, Bld: 252 mg/dL — ABNORMAL HIGH (ref 70–99)
Potassium: 4.3 mmol/L (ref 3.5–5.1)
Sodium: 138 mmol/L (ref 135–145)

## 2019-06-02 MED ORDER — ADULT MULTIVITAMIN W/MINERALS CH
1.0000 | ORAL_TABLET | Freq: Every day | ORAL | Status: DC
  Administered 2019-06-03 – 2019-06-07 (×5): 1 via ORAL
  Filled 2019-06-02 (×5): qty 1

## 2019-06-02 MED ORDER — INSULIN DETEMIR 100 UNIT/ML ~~LOC~~ SOLN
18.0000 [IU] | Freq: Every day | SUBCUTANEOUS | Status: DC
  Administered 2019-06-02 – 2019-06-05 (×4): 18 [IU] via SUBCUTANEOUS
  Filled 2019-06-02 (×4): qty 0.18

## 2019-06-02 MED ORDER — CHLORHEXIDINE GLUCONATE CLOTH 2 % EX PADS
6.0000 | MEDICATED_PAD | Freq: Every day | CUTANEOUS | Status: DC
  Administered 2019-06-02 – 2019-06-07 (×6): 6 via TOPICAL

## 2019-06-02 MED ORDER — SODIUM CHLORIDE 0.9% FLUSH
10.0000 mL | INTRAVENOUS | Status: DC | PRN
  Administered 2019-06-07: 15:00:00 10 mL
  Filled 2019-06-02: qty 40

## 2019-06-02 MED ORDER — ENSURE MAX PROTEIN PO LIQD
11.0000 [oz_av] | Freq: Every day | ORAL | Status: DC
  Administered 2019-06-02 – 2019-06-07 (×6): 11 [oz_av] via ORAL
  Filled 2019-06-02 (×7): qty 330

## 2019-06-02 MED ORDER — SODIUM CHLORIDE 0.9% FLUSH
10.0000 mL | Freq: Two times a day (BID) | INTRAVENOUS | Status: DC
  Administered 2019-06-02 – 2019-06-06 (×2): 10 mL

## 2019-06-02 NOTE — Progress Notes (Signed)
PT Cancellation Note  Patient Details Name: Taylor Hughes MRN: [id number] DOB: 03-30-1971   Cancelled Treatment:    Reason Eval/Treat Not Completed: Patient declined, no reason specified patient politely declines due to lunch having just arrived, will attempt to try back if time/schedule allow.    Deniece Ree PT, DPT, CBIS  Supplemental Physical Therapist [facility]    Pager [phone] Acute Rehab Office [phone]

## 2019-06-02 NOTE — TOC Progression Note (Signed)
Transition of Care [facility]) - Progression Note    Patient Details  Name: Taylor Hughes MRN: [id number] Date of Birth: Mar 12, 1971  Transition of Care [facility]) CM/SW [place], [place] Phone Number: 06/02/2019, 4:32 PM  Clinical Narrative:   CSW noting that patient also has wound vac, will need to DC home with the wound vac. CSW attempted to complete form for [employer] and dimensions of the wound are not noted. CSW sent a message to RN that wound measurements need to be noted in order to order supplies for patient to go home with wound vac. CSW to follow.    Expected Discharge Plan: [place] Barriers to Discharge: Inadequate or no insurance, Equipment Delay  Expected Discharge Plan and Services Expected Discharge Plan: [facility] Choice: Durable Medical Equipment, Home Health Living arrangements for the past 2 months: Single Family Home                 DME Arranged: 3-N-1, Tub bench DME Agency: [employer] Date DME Agency Contacted: 06/02/19 Time DME Agency Contacted: [phone] Representative spoke with at DME Agency: [place]: RN, PT [place] Agency: [facility] [place] Date [place]: 06/02/19 Time [place]: 1303 Representative spoke with at [place]: [place] ([place]) Interventions    Readmission Risk Interventions No flowsheet data found.

## 2019-06-02 NOTE — Progress Notes (Signed)
PROGRESS NOTE  Taylor Hughes [id number] DOB: 20-Oct-1970 DOA: 05/23/2019 PCP: Charlott Rakes, MD  HPI/Recap of past 24 hours: 48 yo male whopresented with fever and left anterior chest wall pain. 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 Patient reported fevers and chest pain for about 2 days. No history of local trauma. On his initial physical examination he was febrile 100.5 F, blood pressure 110/75, heart rate 108, respiratory 24, oxygen saturation 93%, his lungs are clear to auscultation bilaterally, heart S1-S2 present rhythmic, abdomen soft, no lower extremity edema. Tenderness in the left anterior chest wall with positive local induration. 2 opening wounds in the mid upper anterior thorax. Sodium134, potassium 3.6, chloride 98, bicarb 23,glucose 128, BUN 16, creatinine 1.0, lactic acid 2.2, white cell count 8.5, hemoglobin 11.7, hematocrit 35.7, platelets 350. SARSCOVID-19 was negative.Urine analysis specific gravity more than 1.046, 30 protein,glucose 150.CT chest with old hematoma/infected fluid left pectoralis major,collection extending superiorly and medially to the left sternoclavicular joint.  Patient was admitted to the hospital working diagnosis of sepsis due to left pectoralis muscle cellulitis/abscess.  Patient tolerating weill IV antibiotic therapy with Vancomycin and Zosyn, he has remained afebrile with no leukocytosis. Persistent hard collection on his left anterior chest wall.  Transferred to [facility] from [facility] for debridement.   CTS made aware of the patient's arrival at [facility] and will see in consultation.  Seen by CTS with plan for I&D and surgical debridement on Monday, 05/30/2019.  POD #0 post left chest wall abscess I&D with placement of ACell matrix and wound VAC on 05/30/19 by Dr. Kipp Brood.  06/01/19:patient was seen and  examined at his bedside this morning.  No acute events overnight.  Infectious disease consulted to assist with antibiotics in the setting of polymicrobial deep tissue infection with group A strep and MSSA to left chest wall extending down to the joint.  06/02/19: Patient was seen and examined at his bedside this morning.  Hypoglycemic yesterday late afternoon for which his insulin coverage was decreased.  Left chest wound with drainage noted this morning.  Contacted CTS for possible reassessment.   Assessment/Plan: Active Problems:   Essential hypertension   Type 2 diabetes mellitus with other specified complication (HCC)   Sepsis ([place])  POD #3 post left chest wall abscess I&D with placement of ACell matrix and wound VAC on 05/30/19 by Dr. Kipp Brood. Optimize pain control Monitor wound vac fluid output Follow cultures Continue IV antibiotics Abscess wound culture obtained on 05/24/19.  Grew staph aureus and group A strep pansensitive.   Currently on Ancef Tissue abscess chest wall deep wound Gram stain taken on 05/30/2019 showed no organisms.  Culture showed no growth less than 24. Deep wound abscess pectoralis taken on 05/30/2019 showed few gram-positive cocci in pairs.  Culture reintubated for better growth. ID consulted on 06/01/19. Appreciate assistance. Plan for PICC line placement on 06/02/2019 CTS contacted on 06/02/2019 for possible reassessment of chest wound with drainage  Polymicrobial deep tissue infection with croup a strep and MSSA to left chest wall extended down to the joint Infectious disease recommends 6 weeks of IV cefazolin, starting date date 05/30/2019 Open orders PICC line placement OPAT per pharmacy Will follow-up with CTS outpatient  Sepsis secondary to left pectoralis muscle cellulitis/abscess  Presented with fever, tachycardia and tachypnea CT chest with contrast done on 05/31/2019 showed: There is a 5.6 x 2.8 cm ill-defined low attenuating  collection in  the left pectoralis major which may represent an old hematoma or infected fluid. This appears to extend more superiorly and medially to the left sternoclavicular joint. There is irregularity and fragmentation of the left sternoclavicular joint which may represent an infectious process. Initially was seen by general surgery, signed off on 05/26/2019 Transferred to [facility] on 05/26/2019 for possible debridement by CTS. Continue broad-spectrum antibiotics IV vancomycin and IV Zosyn empirically. Sepsis physiology is resolving on IV antibiotics, T-max 99.4 Blood cultures x2 drawn peripherally taken on 05/23/2019 no growth final. Aerobic culture taken from superficial specimen, wound abscess taken on 05/24/2019: Rare Staphylococcus aureus and rare group A strep.  Pansensitive. MRSA screening negative. Continue to maximize pain control Post I&D and surgical debridement on Monday, 05/30/2019 by CTS.  Microbiology data as stated above  Chronic bilateral lower extremity edema We will order compression stockings Last 2D echo on 07/30/2018 showed LVEF 65 to 70%  Polyneuropathy Continue gabapentin 300 mg BID  Improving AKI, suspect prerenal secondary to poor oral intake Baseline creatinine appears to be 0.9 with GFR greater than 60 Creatinine 2.42 on 05/29/2019 with GFR of 36 Creatinine 2.53 with GFR of 34 on 05/30/2019 Creatinine 2.2 with GFR of 39 on 05/31/2019 Cr 1.96 on 06/01/19 with GFR46  Creatinine 1.59 on 06/02/2019 with GFR 59 Continue to avoid nephrotoxins such as NSAIDs  Health screening HIV screening negative 2019  Resolved Non anion gap metabolic acidosis likely secondary to acute renal failure Chemistry bicarb 21 and anion gap of 11 on 05/30/2019 Chemistry bicarb 24 and AG 9 on 06/01/19 Chemistry bicarb 24 and anion gap of 10 on 06/02/2019. Continue to closely monitor if not improved consider bicarb replacement Completed p.o. bicarb replacement   Transient hypoglycemia likely  secondary to poor oral intake Decreased dose of insulin coverage Continue insulin sliding scale as needed Goal CBG between 140 and 180  Insulin-dependent type 2 diabetes with hyperglycemia Hemoglobin A1c 11.4 on 11/25/2018 Hemoglobin A1c 12.7 on 05/27/2019. Continue insulin coverage Goal blood sugar between 140 and 180  Mild acute blood loss anemia Hemoglobin stable 10.7 from 10.0 on 05/29/2019 No sign of overt bleeding  Suspected situational anxiety No prior history of chronic anxiety or psych illness  Resolved intractable nausea and vomiting, unclear etiology  Chronic diastolic CHF Last 2D echo done on 07/30/2018 showed LVEF 65 to 70% Continue strict I's and O's and daily weight Monitor volume status Continue metoprolol  Chronic normocytic anemia Hemoglobin appears to be at his baseline 10.4 with MCV of 91 on 05/28/2019 No sign of overt bleeding Drop H&H 10.0 on 05/29/2019. Continue to monitor H&H  Obesity BMI 36 Recommend weight loss outpatient with regular physical activity and healthy dieting  Essential hypertension Blood pressure is currently at goal. Continue metoprolol at home dose Continue to monitor vital signs  DVT prophylaxis:Subcu Heparin 5000 U 3 times daily.  Code Status:full Family Communication:no family at the bedside Disposition Plan: Patient is currently not appropriate for discharge at this time due to ongoing treatment for pectoralis abscess, possibly requiring debridement by cardiothoracic surgery.  Will discharge to home when CTS signs off and patient is hemodynamically stable.  Body mass index is 36.26 kg/m.    Consultants:   Surgery   CT surgery  ID   Procedures:     Antimicrobials:   Ancef   Objective: Vitals:   06/02/19 0015 06/02/19 0340 06/02/19 0843 06/02/19 0845  BP: (!) 144/89  (!) 142/101 (!) 157/99  Pulse: 85  85 83  Resp:   16   Temp:   98.1 F (36.7 C)   TempSrc:   Oral   SpO2:   100%    Weight:  118.7 kg    Height:        Intake/Output Summary (Last 24 hours) at 06/02/2019 1413 Last data filed at 06/02/2019 0900 Gross per 24 hour  Intake 1440 ml  Output 700 ml  Net 740 ml   Filed Weights   05/29/19 0500 05/30/19 0416 06/02/19 0340  Weight: 117.3 kg 117.8 kg 118.7 kg    Exam:  . General: 48 y.o. year-old male well-developed well-nourished in no acute distress.  Alert and oriented x4. . Cardiovascular: Regular rate and rhythm no rubs or gallops no JVD or thyromegaly.  Chest with wound VAC and cloudy drainage.  Respiratory: Clear to oscillation no wheezes or rales. . Abdomen: Soft nontender nondistended normal bowel sounds present. . Musculoskeletal: 1+ pitting edema in lower extremities bilaterally. Marland Kitchen Psychiatry: Mood is appropriate for condition and setting.   Data Reviewed: CBC: Recent Labs  Lab 05/29/19 0442 05/31/19 0743  WBC 7.1 6.6  NEUTROABS 5.4  --   HGB 10.0* 10.7*  HCT 30.6* 31.8*  MCV 89.0 88.6  PLT 365 [id number]   Basic Metabolic Panel: Recent Labs  Lab 05/29/19 0442 05/30/19 0229 05/31/19 0743 06/01/19 0245 06/02/19 0318  NA 138 137 136 138 138  K 3.8 4.0 4.3 4.4 4.3  CL 106 105 101 105 104  CO2 22 21* 21* 24 24  GLUCOSE 171* 241* 285* 124* 252*  BUN 8 9 14 14 13   CREATININE 2.42* 2.53* 2.25* 1.96* 1.59*  CALCIUM 8.2* 8.0* 8.4* 8.6* 8.2*  MG 1.8  --   --   --   --   PHOS 3.5  --   --   --   --    GFR: Estimated Creatinine Clearance: 75.3 mL/min (A) (by C-G formula based on SCr of 1.59 mg/dL (H)). Liver Function Tests: Recent Labs  Lab 05/29/19 0442  AST 16  ALT 8  ALKPHOS 65  BILITOT 0.6  PROT 7.2  ALBUMIN 2.0*   No results for input(s): LIPASE, AMYLASE in the last 168 hours. No results for input(s): AMMONIA in the last 168 hours. Coagulation Profile: Recent Labs  Lab 05/30/19 0229  INR 1.2   Cardiac Enzymes: No results for input(s): CKTOTAL, CKMB, CKMBINDEX, TROPONINI in the last 168 hours. BNP (last 3 results)  No results for input(s): PROBNP in the last 8760 hours. HbA1C: No results for input(s): HGBA1C in the last 72 hours. CBG: Recent Labs  Lab 06/01/19 1650 06/01/19 1742 06/01/19 2134 06/02/19 0639 06/02/19 1143  GLUCAP 58* 90 182* 211* 226*   Lipid Profile: No results for input(s): CHOL, HDL, LDLCALC, TRIG, CHOLHDL, LDLDIRECT in the last 72 hours. Thyroid Function Tests: No results for input(s): TSH, T4TOTAL, FREET4, T3FREE, THYROIDAB in the last 72 hours. Anemia Panel: No results for input(s): VITAMINB12, FOLATE, FERRITIN, TIBC, IRON, RETICCTPCT in the last 72 hours. Urine analysis:    Component Value Date/Time   COLORURINE YELLOW 05/24/2019 0106   APPEARANCEUR HAZY (A) 05/24/2019 0106   LABSPEC >1.046 (H) 05/24/2019 0106   PHURINE 5.0 05/24/2019 0106   GLUCOSEU 150 (A) 05/24/2019 0106   HGBUR SMALL (A) 05/24/2019 0106   BILIRUBINUR NEGATIVE 05/24/2019 0106   KETONESUR 20 (A) 05/24/2019 0106   PROTEINUR 30 (A) 05/24/2019 0106   NITRITE NEGATIVE 05/24/2019 0106   LEUKOCYTESUR NEGATIVE 05/24/2019 0106   Sepsis Labs: @LABRCNTIP (procalcitonin:4,lacticidven:4)  )  Recent Results (from the past 240 hour(s))  Culture, blood (routine x 2)     Status: None   Collection Time: 05/23/19  9:33 PM   Specimen: BLOOD  Result Value Ref Range Status   Specimen Description   Final    BLOOD BLOOD LEFT HAND Performed at [facility] [address], [place], [place] [address]    Special Requests   Final    BOTTLES DRAWN AEROBIC AND ANAEROBIC Blood Culture adequate volume Performed at [facility] [address] [place], [place] [address]    Culture   Final    NO GROWTH 5 DAYS Performed at [facility], [place] [address], [place], [place] [address]    Report Status 05/28/2019 FINAL  Final  Culture, blood (routine x 2)     Status: None   Collection Time: 05/23/19  9:33 PM   Specimen: BLOOD  Result Value Ref Range Status   Specimen  Description   Final    BLOOD BLOOD RIGHT FOREARM Performed at [facility] [address], [place], [place] [address]    Special Requests   Final    BOTTLES DRAWN AEROBIC ONLY Blood Culture results may not be optimal due to an inadequate volume of blood received in culture bottles Performed at [facility] [address] [place], [place] [address]    Culture   Final    NO GROWTH 5 DAYS Performed at [facility], [place] [address] [place], [place] [address]    Report Status 05/28/2019 FINAL  Final  SARS Coronavirus 2 by RT PCR (hospital order, performed in [facility] hospital lab) Nasopharyngeal Nasopharyngeal Swab     Status: None   Collection Time: 05/24/19 12:26 AM   Specimen: Nasopharyngeal Swab  Result Value Ref Range Status   SARS Coronavirus 2 NEGATIVE NEGATIVE Final    Comment: (NOTE) If result is NEGATIVE SARS-CoV-2 target nucleic acids are NOT DETECTED. The SARS-CoV-2 RNA is generally detectable in upper and lower  respiratory specimens during the acute phase of infection. The lowest  concentration of SARS-CoV-2 viral copies this assay can detect is 250  copies / mL. A negative result does not preclude SARS-CoV-2 infection  and should not be used as the sole basis for treatment or other  patient management decisions.  A negative result may occur with  improper specimen collection / handling, submission of specimen other  than nasopharyngeal swab, presence of viral mutation(s) within the  areas targeted by this assay, and inadequate number of viral copies  (<250 copies / mL). A negative result must be combined with clinical  observations, patient history, and epidemiological information. If result is POSITIVE SARS-CoV-2 target nucleic acids are DETECTED. The SARS-CoV-2 RNA is generally detectable in upper and lower  respiratory specimens dur ing the acute phase of infection.  Positive  results are indicative of active  infection with SARS-CoV-2.  Clinical  correlation with patient history and other diagnostic information is  necessary to determine patient infection status.  Positive results do  not rule out bacterial infection or co-infection with other viruses. If result is PRESUMPTIVE POSTIVE SARS-CoV-2 nucleic acids MAY BE PRESENT.   A presumptive positive result was obtained on the submitted specimen  and confirmed on repeat testing.  While 2019 novel coronavirus  (SARS-CoV-2) nucleic acids may be present in the submitted sample  additional confirmatory testing may be necessary for epidemiological  and / or clinical management purposes  to  differentiate between  SARS-CoV-2 and other Sarbecovirus currently known to infect humans.  If clinically indicated additional testing with an alternate test  methodology [phone]) is advised. The SARS-CoV-2 RNA is generally  detectable in upper and lower respiratory sp ecimens during the acute  phase of infection. The expected result is Negative. Fact Sheet for Patients:  StrictlyIdeas.no Fact Sheet for Healthcare Providers: BankingDealers.co.za This test is not yet approved or cleared by the [place] FDA and has been authorized for detection and/or diagnosis of SARS-CoV-2 by FDA under an Emergency Use Authorization (EUA).  This EUA will remain in effect (meaning this test can be used) for the duration of the COVID-19 declaration under Section 564(b)(1) of the Act, 21 U.S.C. section 360bbb-3(b)(1), unless the authorization is terminated or revoked sooner. Performed at [facility], [place] [address], [place], [place] [address]   Aerobic Culture (superficial specimen)     Status: None   Collection Time: 05/24/19  8:05 AM   Specimen: Abscess; Wound  Result Value Ref Range Status   Specimen Description   Final    ABSCESS CHEST Performed at [facility] [address] [place], [place] [address]    Special Requests   Final    NONE Performed at [facility], [place] [address] [place], [place] [address]    Gram Stain   Final    FEW WBC PRESENT, PREDOMINANTLY PMN FEW GRAM POSITIVE COCCI IN CLUSTERS Performed at [facility], [place] [address], [place], [place] [address]    Culture   Final    RARE STAPHYLOCOCCUS AUREUS RARE GROUP A STREP (S.PYOGENES) ISOLATED    Report Status 05/28/2019 FINAL  Final   Organism ID, Bacteria STAPHYLOCOCCUS AUREUS  Final      Susceptibility   Staphylococcus aureus - MIC*    CIPROFLOXACIN <=0.5 SENSITIVE Sensitive     ERYTHROMYCIN <=0.25 SENSITIVE Sensitive     GENTAMICIN <=0.5 SENSITIVE Sensitive     OXACILLIN 0.5 SENSITIVE Sensitive     TETRACYCLINE <=1 SENSITIVE Sensitive     VANCOMYCIN <=0.5 SENSITIVE Sensitive     TRIMETH/SULFA <=10 SENSITIVE Sensitive     CLINDAMYCIN <=0.25 SENSITIVE Sensitive     RIFAMPIN <=0.5 SENSITIVE Sensitive     Inducible Clindamycin NEGATIVE Sensitive     * RARE STAPHYLOCOCCUS AUREUS  MRSA PCR Screening     Status: None   Collection Time: 05/27/19  1:00 PM   Specimen: Nasal Mucosa; Nasopharyngeal  Result Value Ref Range Status   MRSA by PCR NEGATIVE NEGATIVE Final    Comment:        The GeneXpert MRSA Assay (FDA approved for NASAL specimens only), is one component of a comprehensive MRSA colonization surveillance program. It is not intended to diagnose MRSA infection nor to guide or monitor treatment for MRSA infections. Performed at [facility], [place] [address] [place], [place] [address]   Surgical pcr screen     Status: None   Collection Time: 05/30/19  1:34 AM   Specimen: Nasal Mucosa; Nasal Swab  Result Value Ref Range Status   MRSA, PCR NEGATIVE NEGATIVE Final   Staphylococcus aureus NEGATIVE NEGATIVE Final    Comment: (NOTE) The Xpert SA Assay (FDA approved for NASAL specimens in patients 12 years of age and older), is one component of a  comprehensive surveillance program. It is not intended to diagnose infection nor to guide or monitor treatment. Performed at [facility], [place],  [place] [address]   Aerobic/Anaerobic Culture (surgical/deep wound)     Status: None (Preliminary result)   Collection Time: 05/30/19 10:19 AM   Specimen: PATH Soft tissue  Result Value Ref Range Status   Specimen Description TISSUE ABSCESS CHEST WALL  Final   Special Requests A  Final   Gram Stain   Final    RARE WBC PRESENT, PREDOMINANTLY PMN NO ORGANISMS SEEN Performed at [facility], [place] [address], [place], [place] [address]    Culture   Final    RARE STAPHYLOCOCCUS AUREUS SUSCEPTIBILITIES TO FOLLOW CRITICAL RESULT CALLED TO, READ BACK BY AND VERIFIED WITH: RN S DEAVER [id number] AT [id number] BY CM NO ANAEROBES ISOLATED; CULTURE IN PROGRESS FOR 5 DAYS    Report Status PENDING  Incomplete  Aerobic/Anaerobic Culture (surgical/deep wound)     Status: None (Preliminary result)   Collection Time: 05/30/19 10:34 AM   Specimen: PATH Other; Body Fluid  Result Value Ref Range Status   Specimen Description ABSCESS PECTORALIS  Final   Special Requests B  Final   Gram Stain   Final    MODERATE WBC PRESENT, PREDOMINANTLY PMN FEW GRAM POSITIVE COCCI IN PAIRS Performed at [facility], [address], [place], [place] [address]    Culture   Final    MODERATE STAPHYLOCOCCUS AUREUS NO ANAEROBES ISOLATED; CULTURE IN PROGRESS FOR 5 DAYS    Report Status PENDING  Incomplete   Organism ID, Bacteria STAPHYLOCOCCUS AUREUS  Final      Susceptibility   Staphylococcus aureus - MIC*    CIPROFLOXACIN <=0.5 SENSITIVE Sensitive     ERYTHROMYCIN <=0.25 SENSITIVE Sensitive     GENTAMICIN <=0.5 SENSITIVE Sensitive     OXACILLIN 0.5 SENSITIVE Sensitive     TETRACYCLINE <=1 SENSITIVE Sensitive     VANCOMYCIN 1 SENSITIVE Sensitive     TRIMETH/SULFA <=10 SENSITIVE Sensitive     CLINDAMYCIN <=0.25 SENSITIVE Sensitive      RIFAMPIN <=0.5 SENSITIVE Sensitive     Inducible Clindamycin NEGATIVE Sensitive     * MODERATE STAPHYLOCOCCUS AUREUS      Studies: No results found.  Scheduled Meds: . Chlorhexidine Gluconate Cloth  6 each Topical Daily  . gabapentin  300 mg Oral BID  . heparin injection (subcutaneous)  5,000 Units Subcutaneous Q8H  . insulin aspart  0-5 Units Subcutaneous QHS  . insulin aspart  0-9 Units Subcutaneous TID WC  . insulin aspart  4 Units Subcutaneous TID WC  . insulin detemir  18 Units Subcutaneous Daily  . metoprolol tartrate  50 mg Oral BID  . [START ON 06/03/2019] multivitamin with minerals  1 tablet Oral Daily  . Ensure Max Protein  11 oz Oral Daily  . senna-docusate  2 tablet Oral BID  . sodium chloride flush  10-40 mL Intracatheter Q12H    Continuous Infusions: .  ceFAZolin (ANCEF) IV 2 g (06/02/19 0708)     LOS: 9 days     Kayleen Memos, MD [facility] Pager [phone]  If 7PM-7AM, please contact night-coverage [email or web address] Password [email or web address] 06/02/2019, 2:13 PM

## 2019-06-02 NOTE — Progress Notes (Signed)
Initial Nutrition Assessment  DOCUMENTATION CODES:   Obesity unspecified  INTERVENTION:   - MVI with minerals daily  - Double protein portions TID with meals  - Ensure Max po daily, each supplement provides 150 kcal and 30 grams of protein  NUTRITION DIAGNOSIS:   Increased nutrient needs related to wound healing as evidenced by estimated needs.  GOAL:   Patient will meet greater than or equal to 90% of their needs  MONITOR:   PO intake, Supplement acceptance, Labs, Weight trends, Skin, I & O's  REASON FOR ASSESSMENT:   Consult Assessment of nutrition requirement/status  ASSESSMENT:   48 year old male who presented to the ED on 10/12 with fever and fatigue. PMH of HTN, T2DM, atrial fibrillation, GERD. Pt was admitted in April 2020 for necrotizing fasciitis involving the neck and anterior chest wall muscles.   10/19 - s/p I&D of left chest wall abscess, placement of wound VAC  Spoke with pt at bedside. Pt reports that for the first few days after admission, his appetite was not very good. Pt states that over the last few days, however, his appetite has greatly improved.  Discussed importance of adequate nutrition in wound healing. Pt willing to drink a high-protein oral nutrition supplement to aid in wound healing. RD will also order a daily MVI and double protein portions with meals.  Pt reports that PTA, he had difficulty controlling his blood sugars. Pt with multiple questions about his home insulin regimen. RD contacted Diabetes Coordinator who will plan to see pt before discharge for education regarding home insulin regimen.  Pt's weight has been trending up over the last 10 months. Weight up 4 lbs this admission. Suspect weight gain related to fluid status.  Meal Completion: 100% x last 7 recorded meals  Medications reviewed and include: SSI, Novolog 4 units TID with meals, Levemir 18 units daily, Senna, IV abx  Labs reviewed. CBG's: 58-226 x 24 hours  UOP: 1350  ml x 24 hours  NUTRITION - FOCUSED PHYSICAL EXAM:    Most Recent Value  Orbital Region  No depletion  Upper Arm Region  No depletion  Thoracic and Lumbar Region  No depletion  Buccal Region  No depletion  Temple Region  No depletion  Clavicle Bone Region  No depletion  Clavicle and Acromion Bone Region  No depletion  Scapular Bone Region  No depletion  Dorsal Hand  No depletion  Patellar Region  No depletion  Anterior Thigh Region  No depletion  Posterior Calf Region  No depletion  Edema (RD Assessment)  Moderate [BLE]  Hair  Reviewed  Eyes  Reviewed  Mouth  Reviewed  Skin  Reviewed  Nails  Reviewed       Diet Order:   Diet Order            Diet heart healthy/carb modified Room service appropriate? Yes; Fluid consistency: Thin  Diet effective now              EDUCATION NEEDS:   Education needs have been addressed  Skin:  Skin Assessment: Skin Integrity Issues: Skin Integrity Issues: Wound Vac: chest  Last BM:  06/01/19  Height:   Ht Readings from Last 1 Encounters:  05/23/19 5\' 11"  (1.803 m)    Weight:   Wt Readings from Last 1 Encounters:  06/02/19 118.7 kg    Ideal Body Weight:  78.2 kg  BMI:  Body mass index is 36.5 kg/m.  Estimated Nutritional Needs:   Kcal:  [id number]  Protein:  115-130 grams  Fluid:  >/= 2.0 L    Gaynell Face, MS, RD, LDN Inpatient Clinical Dietitian Pager: [phone] Weekend/After Hours: [phone]

## 2019-06-02 NOTE — TOC Initial Note (Signed)
Transition of Care [facility]) - Initial/Assessment Note    Patient Details  Name: Taylor Hughes MRN: [id number] Date of Birth: 05/15/1971  Transition of Care [facility]) CM/SW Contact:    Geralynn Ochs, LCSW Phone Number: 06/02/2019, 1:03 PM  Clinical Narrative:   CSW met with patient to discuss discharge home. Patient will need to go home with IV antibiotics, and has no insurance. CSW confirmed with patient that he has no insurance, and that he would need support in figuring out his care for going home. Patient indicated that he's worried because he knows that he needs the care but doesn't think he can afford it. CSW explained charity programs that are available, for IV antibiotics, home health, and equipment. Patient was agreeable to pursuing options available to see if he would qualify, and was very appreciative of assistance.  CSW spoke with Pam with [employer] to discuss patient going home with IV antibiotics, and she will reach out to patient this afternoon to discuss education and work on getting patient set up for charity antibiotics, if possible.   CSW spoke with Dorian Pod with [facility] about charity home health services, and she will contact patient to see if he qualifies and will ensure that they have staffing available to assist him with RN.   CSW spoke with Zack with [employer] about a 3N1 and tub bench for the patient under charity. Edwyna Ready will arrange to have equipment delivered to patient's room.   CSW to follow for when services are established and patient is able to go home.        Expected Discharge Plan: [place] Barriers to Discharge: Inadequate or no insurance, Equipment Delay   Patient Goals and CMS Choice Patient states their goals for this hospitalization and ongoing recovery are:: getting home with the care that he needs CMS Medicare.gov Compare Post Acute Care list provided to:: Patient Choice offered to / list presented to : Patient  Expected Discharge Plan  and Services Expected Discharge Plan: [place] Choice: Durable Medical Equipment, Home Health Living arrangements for the past 2 months: Single Family Home                 DME Arranged: 3-N-1, Tub bench DME Agency: [employer] Date DME Agency Contacted: 06/02/19 Time DME Agency Contacted: [phone] Representative spoke with at DME Agency: [place] Arranged: RN, PT [place] Agency: [facility] Date [place]: 06/02/19 Time [place]: 1303 Representative spoke with at [place]: Dorian Pod  Prior Living Arrangements/Services Living arrangements for the past 2 months: [place] with:: Self, Significant Other Patient language and need for interpreter reviewed:: No Do you feel safe going back to the place where you live?: Yes      Need for Family Participation in Patient Care: No (Comment) Care giver support system in place?: Yes (comment)   Criminal Activity/Legal Involvement Pertinent to Current Situation/Hospitalization: No - Comment as needed  Activities of Daily Living Home Assistive Devices/Equipment: Eyeglasses, CBG Meter ADL Screening (condition at time of admission) Patient's cognitive ability adequate to safely complete daily activities?: Yes Is the patient deaf or have difficulty hearing?: No Does the patient have difficulty seeing, even when wearing glasses/contacts?: No Does the patient have difficulty concentrating, remembering, or making decisions?: No Patient able to express need for assistance with ADLs?: Yes Does the patient have difficulty dressing or bathing?: No Independently performs ADLs?: Yes (appropriate for developmental age)  Does the patient have difficulty walking or climbing stairs?: Yes(secondary to weakness) Weakness of Legs: Both Weakness of Arms/Hands: None  Permission Sought/Granted                  Emotional Assessment Appearance:: Appears stated  age Attitude/Demeanor/Rapport: Engaged Affect (typically observed): Pleasant Orientation: : Oriented to Self, Oriented to Place, Oriented to  Time, Oriented to Situation Alcohol / Substance Use: Not Applicable Psych Involvement: No (comment)  Admission diagnosis:  Cellulitis of chest wall [L03.313] Sepsis ([place]) [A41.9] Patient Active Problem List   Diagnosis Date Noted  . Sepsis ([place]) 05/24/2019  . Cellulitis of chest wall   . Venous insufficiency (chronic) (peripheral) 03/15/2019  . Edema of both lower extremities due to peripheral venous insufficiency 03/15/2019  . Hypoglycemia due to insulin 12/18/2018  . Acute encephalopathy 12/17/2018  . Neck abscess 11/23/2018  . Essential hypertension 11/23/2018  . AKI (acute kidney injury) ([place]) 11/23/2018  . Obesity (BMI 30.0-34.9) 11/23/2018  . Type 2 diabetes mellitus with other specified complication ([place]) 46/43/1427  . Sepsis due to group B Streptococcus ([place])   . Diabetic infection of left foot ([place])   . Left foot infection   . DKA, type 2 ([place]) 07/28/2018  . Leukocytosis 07/28/2018  . Hyperkalemia 07/28/2018  . Hyponatremia 07/28/2018   PCP:  Charlott Rakes, MD Pharmacy:   [facility], [address] [place] [address] Phone: [phone] Fax: [place], [place] [place] [address] Phone: [phone] Fax: [place], [place] [address] [address] [place] [address] Phone: [phone] Fax: [phone]     Social Determinants of Health (SDOH) Interventions    Readmission Risk Interventions No flowsheet data found.

## 2019-06-02 NOTE — Plan of Care (Signed)
  Problem: Education: Goal: Knowledge of General Education information will improve Description Including pain rating scale, medication(s)/side effects and non-pharmacologic comfort measures Outcome: Progressing   

## 2019-06-03 LAB — GLUCOSE, CAPILLARY
Glucose-Capillary: 238 mg/dL — ABNORMAL HIGH (ref 70–99)
Glucose-Capillary: 326 mg/dL — ABNORMAL HIGH (ref 70–99)
Glucose-Capillary: 121 mg/dL — ABNORMAL HIGH (ref 70–99)
Glucose-Capillary: 166 mg/dL — ABNORMAL HIGH (ref 70–99)

## 2019-06-03 LAB — BRAIN NATRIURETIC PEPTIDE: B Natriuretic Peptide: 351.4 pg/mL — ABNORMAL HIGH (ref 0.0–100.0)

## 2019-06-03 MED ORDER — ADULT MULTIVITAMIN W/MINERALS CH: 1.0000 | ORAL_TABLET | Freq: Every day | ORAL | 0 refills | Status: DC

## 2019-06-03 MED ORDER — AMLODIPINE BESYLATE 5 MG PO TABS: 5.0000 mg | ORAL_TABLET | Freq: Every day | ORAL | 0 refills | Status: DC

## 2019-06-03 MED ORDER — METOPROLOL TARTRATE 50 MG PO TABS: 50.0000 mg | ORAL_TABLET | Freq: Two times a day (BID) | ORAL | 0 refills | Status: DC

## 2019-06-03 MED ORDER — CEFAZOLIN IV (FOR PTA / DISCHARGE USE ONLY): 2.0000 g | Freq: Three times a day (TID) | INTRAVENOUS | 0 refills | Status: DC

## 2019-06-03 MED ORDER — HYDROCODONE-ACETAMINOPHEN 5-325 MG PO TABS: 1.0000 | ORAL_TABLET | Freq: Every day | ORAL | 0 refills | Status: DC | PRN

## 2019-06-03 MED ORDER — GABAPENTIN 300 MG PO CAPS: 300.0000 mg | ORAL_CAPSULE | Freq: Two times a day (BID) | ORAL | 0 refills | Status: DC

## 2019-06-03 MED ORDER — INSULIN ASPART 100 UNIT/ML FLEXPEN: 6.0000 [IU] | PEN_INJECTOR | Freq: Three times a day (TID) | SUBCUTANEOUS | 0 refills | Status: DC

## 2019-06-03 MED ORDER — INSULIN DETEMIR 100 UNIT/ML FLEXPEN: 18.0000 [IU] | PEN_INJECTOR | Freq: Every day | SUBCUTANEOUS | 0 refills | Status: DC

## 2019-06-03 MED ORDER — DIPHENHYDRAMINE HCL 25 MG PO CAPS
25.0000 mg | ORAL_CAPSULE | Freq: Four times a day (QID) | ORAL | Status: DC | PRN
  Administered 2019-06-03 – 2019-06-07 (×5): 25 mg via ORAL
  Filled 2019-06-03 (×5): qty 1

## 2019-06-03 MED ORDER — AMLODIPINE BESYLATE 5 MG PO TABS
5.0000 mg | ORAL_TABLET | Freq: Every day | ORAL | Status: DC
  Administered 2019-06-03 – 2019-06-04 (×2): 5 mg via ORAL
  Filled 2019-06-03 (×2): qty 1

## 2019-06-03 NOTE — Plan of Care (Signed)
  Problem: Education: Goal: Knowledge of General Education information will improve Description: Including pain rating scale, medication(s)/side effects and non-pharmacologic comfort measures Outcome: Progressing   Problem: Clinical Measurements: Goal: Will remain free from infection Outcome: Progressing   Problem: Coping: Goal: Level of anxiety will decrease Outcome: Progressing   Problem: Pain Managment: Goal: General experience of comfort will improve Outcome: Progressing   Problem: Safety: Goal: Ability to remain free from injury will improve Outcome: Progressing

## 2019-06-03 NOTE — Progress Notes (Signed)
PROGRESS NOTE  Taylor Hughes [id number] DOB: 1970-08-14 DOA: 05/23/2019 PCP: Charlott Rakes, MD  HPI/Recap of past 24 hours: 48 yo male whopresented with fever and left anterior chest wall pain. 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 Patient reported fevers and chest pain for about 2 days. No history of local trauma. On his initial physical examination he was febrile 100.5 F, blood pressure 110/75, heart rate 108, respiratory 24, oxygen saturation 93%, his lungs are clear to auscultation bilaterally, heart S1-S2 present rhythmic, abdomen soft, no lower extremity edema. Tenderness in the left anterior chest wall with positive local induration. 2 opening wounds in the mid upper anterior thorax. Sodium134, potassium 3.6, chloride 98, bicarb 23,glucose 128, BUN 16, creatinine 1.0, lactic acid 2.2, white cell count 8.5, hemoglobin 11.7, hematocrit 35.7, platelets 350. SARSCOVID-19 was negative.Urine analysis specific gravity more than 1.046, 30 protein,glucose 150.CT chest with old hematoma/infected fluid left pectoralis major,collection extending superiorly and medially to the left sternoclavicular joint.  Patient was admitted to the hospital working diagnosis of sepsis due to left pectoralis muscle cellulitis/abscess.  Patient tolerating weill IV antibiotic therapy with Vancomycin and Zosyn, he has remained afebrile with no leukocytosis. Persistent hard collection on his left anterior chest wall.  Transferred to [facility] for debridement.   CTS made aware of the [facility]'s [facility] and will see in consultation.  Seen by CTS with plan for I&D and surgical debridement on Monday, 05/30/2019.  POD #0 post left chest wall abscess I&D with placement of ACell matrix and wound VAC on 05/30/19 by Dr. Kipp Brood.  06/01/19, ID consulted to assist  with antibiotics in the setting of polymicrobial deep tissue infection with group A strep and MSSA to left chest wall extending down to the joint.  Left chest wound with drainage, contacted CTS for reassessment 06/02/19. Discussed with Dr. Kipp Brood.  06/03/19: Patient was seen and examined at his bedside this morning.  Denies pain at this time.  Drainage noted at his left chest wound.  Uncontrolled hypertension for which Norvasc 5 mg daily was added this morning.  Reports increased swelling in his lower extremities bilaterally.  Bilateral lower extremity edema chronic but worse.  Cardiology consulted to further assess.   Assessment/Plan: Active Problems:   Essential hypertension   Type 2 diabetes mellitus with other specified complication (HCC)   Sepsis ([place])  POD #4 post left chest wall abscess I&D with placement of ACell matrix and wound VAC on 05/30/19 by Dr. Kipp Brood. Continue to optimize pain control Continue to monitor wound vac fluid output Deep wound culture 05/30/2019, abscess pectoralis grew staphylococcus pansensitive. Continue IV antibiotics Abscess wound culture obtained on 05/24/19.  Grew staph aureus and group A strep pansensitive.   Currently on Ancef ID consulted on 06/01/19. Appreciate assistance. Plan for PICC line placement on 06/02/2019 CTS contacted on 06/02/2019 for possible reassessment of chest wound with drainage.  Discussed with Dr. Kipp Brood.  Acute on chronic bilateral lower extremity edema Unable to order compression stockings due to unavailable size. Last 2D echo on 07/30/2018 showed LVEF 65 to 70% Cardiology consulted to further assess  Suspected undiagnosed OSA Based on body habitus self-reported witnessed snoring Recommend polysomnography, will need referral from PCP  Polymicrobial deep tissue infection with croup a strep and MSSA to left chest wall extended down to the joint Infectious disease recommends 6 weeks of IV cefazolin, starting date  date 05/30/2019 Open orders PICC line placement OPAT per pharmacy Will follow-up with CTS outpatient  Sepsis secondary to left pectoralis muscle cellulitis/abscess  Presented with fever, tachycardia and tachypnea CT chest with contrast done on 05/31/2019 showed: There is a 5.6 x 2.8 cm ill-defined low attenuating collection in the left pectoralis major which may represent an old hematoma or infected fluid. This appears to extend more superiorly and medially to the left sternoclavicular joint. There is irregularity and fragmentation of the left sternoclavicular joint which may represent an infectious process. Initially was seen by general surgery, signed off on 05/26/2019 Transferred to [facility] on 05/26/2019 for possible debridement by CTS. Continue broad-spectrum antibiotics IV vancomycin and IV Zosyn empirically. Sepsis physiology is resolving on IV antibiotics, T-max 99.4 Blood cultures x2 drawn peripherally taken on 05/23/2019 no growth final. Aerobic culture taken from superficial specimen, wound abscess taken on 05/24/2019: Rare Staphylococcus aureus and rare group A strep.  Pansensitive. MRSA screening negative. Continue to maximize pain control Post I&D and surgical debridement on Monday, 05/30/2019 by CTS.  Microbiology data as stated above  Polyneuropathy Continue gabapentin 300 mg BID  Improving AKI, suspect prerenal secondary to poor oral intake Baseline creatinine appears to be 0.9 with GFR greater than 60 Creatinine 2.42 on 05/29/2019 with GFR of 36 Creatinine 2.53 with GFR of 34 on 05/30/2019 Creatinine 2.2 with GFR of 39 on 05/31/2019 Cr 1.96 on 06/01/19 with GFR46  Creatinine 1.59 on 06/02/2019 with GFR 59 Continue to avoid nephrotoxins such as NSAIDs  Health screening HIV screening negative 2019  Resolved Non anion gap metabolic acidosis likely secondary to acute renal failure Chemistry bicarb 21 and anion gap of 11 on 05/30/2019 Chemistry bicarb 24 and AG 9 on  06/01/19 Chemistry bicarb 24 and anion gap of 10 on 06/02/2019. Continue to closely monitor if not improved consider bicarb replacement Completed p.o. bicarb replacement   Transient hypoglycemia likely secondary to poor oral intake Decreased dose of insulin coverage Continue insulin sliding scale as needed Goal CBG between 140 and 180  Insulin-dependent type 2 diabetes with hyperglycemia Hemoglobin A1c 11.4 on 11/25/2018 Hemoglobin A1c 12.7 on 05/27/2019. Continue insulin coverage Goal blood sugar between 140 and 180  Mild acute blood loss anemia Hemoglobin stable 10.7 from 10.0 on 05/29/2019 No sign of overt bleeding  Suspected situational anxiety No prior history of chronic anxiety or psych illness  Resolved intractable nausea and vomiting, unclear etiology  Chronic diastolic CHF Last 2D echo done on 07/30/2018 showed LVEF 65 to 70% Continue strict I's and O's and daily weight Monitor volume status Continue metoprolol  Chronic normocytic anemia Hemoglobin appears to be at his baseline 10.4 with MCV of 91 on 05/28/2019 No sign of overt bleeding Drop H&H 10.0 on 05/29/2019. Continue to monitor H&H  Obesity BMI 36 Recommend weight loss outpatient with regular physical activity and healthy dieting  Essential hypertension Blood pressure is currently at goal. Continue metoprolol at home dose Continue to monitor vital signs  DVT prophylaxis:Subcu Heparin 5000 U 3 times daily.  Code Status:full Family Communication:no family at the bedside Disposition Plan: Patient is currently not appropriate for discharge at this time due to ongoing treatment for pectoralis abscess, possibly requiring debridement by cardiothoracic surgery.  Will discharge to home when CTS signs off and patient is hemodynamically stable.  Body mass index is 36.26 kg/m.    Consultants:   Surgery   CT surgery  ID   Procedures:     Antimicrobials:   Ancef   Objective:  Vitals:   06/02/19 1518 06/02/19 2059 06/03/19 0501 06/03/19 0838  BP: (!) 151/96 (!) 164/99 (!) 169/100 (!) 165/97  Pulse: 74 78 73 88  Resp:  16 17   Temp:  99 F (37.2 C) 98.4 F (36.9 C) 97.7 F (36.5 C)  TempSrc:  Oral Oral Oral  SpO2:  100% 99% 100%  Weight:      Height:        Intake/Output Summary (Last 24 hours) at 06/03/2019 0913 Last data filed at 06/03/2019 0700 Gross per 24 hour  Intake 1640 ml  Output 1500 ml  Net 140 ml   Filed Weights   05/29/19 0500 05/30/19 0416 06/02/19 0340  Weight: 117.3 kg 117.8 kg 118.7 kg    Exam:  . General: 48 y.o. year-old male well-developed well-nourished no acute distress.  Alert oriented x4.   . Cardiovascular: Regular rate and rhythm no rubs or gallops no JVD or thyromegaly noted.  Chest with wound VAC and cloudy drainage.   Marland Kitchen Respiratory: Clear to auscultation no wheezes or rales.   . Abdomen: Soft nontender nondistended with bowel sounds present.   . Musculoskeletal: 2+ pitting edema in lower extremities bilaterally. Marland Kitchen Psychiatry: Mood is appropriate for condition and setting..   Data Reviewed: CBC: Recent Labs  Lab 05/29/19 0442 05/31/19 0743  WBC 7.1 6.6  NEUTROABS 5.4  --   HGB 10.0* 10.7*  HCT 30.6* 31.8*  MCV 89.0 88.6  PLT 365 [id number]*   Basic Metabolic Panel: Recent Labs  Lab 05/29/19 0442 05/30/19 0229 05/31/19 0743 06/01/19 0245 06/02/19 0318  NA 138 137 136 138 138  K 3.8 4.0 4.3 4.4 4.3  CL 106 105 101 105 104  CO2 22 21* 21* 24 24  GLUCOSE 171* 241* 285* 124* 252*  BUN 8 9 14 14 13   CREATININE 2.42* 2.53* 2.25* 1.96* 1.59*  CALCIUM 8.2* 8.0* 8.4* 8.6* 8.2*  MG 1.8  --   --   --   --   PHOS 3.5  --   --   --   --    GFR: Estimated Creatinine Clearance: 75.3 mL/min (A) (by C-G formula based on SCr of 1.59 mg/dL (H)). Liver Function Tests: Recent Labs  Lab 05/29/19 0442  AST 16  ALT 8  ALKPHOS 65  BILITOT 0.6  PROT 7.2  ALBUMIN 2.0*   No results for input(s): LIPASE, AMYLASE in  the last 168 hours. No results for input(s): AMMONIA in the last 168 hours. Coagulation Profile: Recent Labs  Lab 05/30/19 0229  INR 1.2   Cardiac Enzymes: No results for input(s): CKTOTAL, CKMB, CKMBINDEX, TROPONINI in the last 168 hours. BNP (last 3 results) No results for input(s): PROBNP in the last 8760 hours. HbA1C: No results for input(s): HGBA1C in the last 72 hours. CBG: Recent Labs  Lab 06/02/19 0639 06/02/19 1143 06/02/19 1650 06/02/19 2129 06/03/19 0705  GLUCAP 211* 226* 176* 169* 238*   Lipid Profile: No results for input(s): CHOL, HDL, LDLCALC, TRIG, CHOLHDL, LDLDIRECT in the last 72 hours. Thyroid Function Tests: No results for input(s): TSH, T4TOTAL, FREET4, T3FREE, THYROIDAB in the last 72 hours. Anemia Panel: No results for input(s): VITAMINB12, FOLATE, FERRITIN, TIBC, IRON, RETICCTPCT in the last 72 hours. Urine analysis:    Component Value Date/Time   COLORURINE YELLOW 05/24/2019 0106   APPEARANCEUR HAZY (A) 05/24/2019 0106   LABSPEC >1.046 (H) 05/24/2019 0106   PHURINE 5.0 05/24/2019 0106   GLUCOSEU 150 (A) 05/24/2019 0106   HGBUR SMALL (A)  05/24/2019 0106   BILIRUBINUR NEGATIVE 05/24/2019 0106   KETONESUR 20 (A) 05/24/2019 0106   PROTEINUR 30 (A) 05/24/2019 0106   NITRITE NEGATIVE 05/24/2019 0106   LEUKOCYTESUR NEGATIVE 05/24/2019 0106   Sepsis Labs: @LABRCNTIP (procalcitonin:4,lacticidven:4)  ) Recent Results (from the past 240 hour(s))  MRSA PCR Screening     Status: None   Collection Time: 05/27/19  1:00 PM   Specimen: Nasal Mucosa; Nasopharyngeal  Result Value Ref Range Status   MRSA by PCR NEGATIVE NEGATIVE Final    Comment:        The GeneXpert MRSA Assay (FDA approved for NASAL specimens only), is one component of a comprehensive MRSA colonization surveillance program. It is not intended to diagnose MRSA infection nor to guide or monitor treatment for MRSA infections. Performed at [facility], [place] [address],  [place], [place] [address]   Surgical pcr screen     Status: None   Collection Time: 05/30/19  1:34 AM   Specimen: Nasal Mucosa; Nasal Swab  Result Value Ref Range Status   MRSA, PCR NEGATIVE NEGATIVE Final   Staphylococcus aureus NEGATIVE NEGATIVE Final    Comment: (NOTE) The Xpert SA Assay (FDA approved for NASAL specimens in patients 64 years of age and older), is one component of a comprehensive surveillance program. It is not intended to diagnose infection nor to guide or monitor treatment. Performed at [facility], [place] [address], [place], [place] [address]   Aerobic/Anaerobic Culture (surgical/deep wound)     Status: None (Preliminary result)   Collection Time: 05/30/19 10:19 AM   Specimen: PATH Soft tissue  Result Value Ref Range Status   Specimen Description TISSUE ABSCESS CHEST WALL  Final   Special Requests A  Final   Gram Stain   Final    RARE WBC PRESENT, PREDOMINANTLY PMN NO ORGANISMS SEEN Performed at [facility], [place] [address] [place], [place] [address]    Culture   Final    RARE STAPHYLOCOCCUS AUREUS SUSCEPTIBILITIES TO FOLLOW CRITICAL RESULT CALLED TO, READ BACK BY AND VERIFIED WITH: RN S DEAVER [id number] AT [id number] BY CM NO ANAEROBES ISOLATED; CULTURE IN PROGRESS FOR 5 DAYS    Report Status PENDING  Incomplete  Aerobic/Anaerobic Culture (surgical/deep wound)     Status: None (Preliminary result)   Collection Time: 05/30/19 10:34 AM   Specimen: PATH Other; Body Fluid  Result Value Ref Range Status   Specimen Description ABSCESS PECTORALIS  Final   Special Requests B  Final   Gram Stain   Final    MODERATE WBC PRESENT, PREDOMINANTLY PMN FEW GRAM POSITIVE COCCI IN PAIRS Performed at [facility], [address], [place], [place] [address]    Culture   Final    MODERATE STAPHYLOCOCCUS AUREUS NO ANAEROBES ISOLATED; CULTURE IN PROGRESS FOR 5 DAYS    Report Status PENDING  Incomplete   Organism ID, Bacteria STAPHYLOCOCCUS AUREUS  Final       Susceptibility   Staphylococcus aureus - MIC*    CIPROFLOXACIN <=0.5 SENSITIVE Sensitive     ERYTHROMYCIN <=0.25 SENSITIVE Sensitive     GENTAMICIN <=0.5 SENSITIVE Sensitive     OXACILLIN 0.5 SENSITIVE Sensitive     TETRACYCLINE <=1 SENSITIVE Sensitive     VANCOMYCIN 1 SENSITIVE Sensitive     TRIMETH/SULFA <=10 SENSITIVE Sensitive     CLINDAMYCIN <=0.25 SENSITIVE Sensitive     RIFAMPIN <=0.5 SENSITIVE Sensitive     Inducible Clindamycin NEGATIVE Sensitive     *  MODERATE STAPHYLOCOCCUS AUREUS      Studies: No results found.  Scheduled Meds: . amLODipine  5 mg Oral Daily  . Chlorhexidine Gluconate Cloth  6 each Topical Daily  . gabapentin  300 mg Oral BID  . heparin injection (subcutaneous)  5,000 Units Subcutaneous Q8H  . insulin aspart  0-5 Units Subcutaneous QHS  . insulin aspart  0-9 Units Subcutaneous TID WC  . insulin aspart  4 Units Subcutaneous TID WC  . insulin detemir  18 Units Subcutaneous Daily  . metoprolol tartrate  50 mg Oral BID  . multivitamin with minerals  1 tablet Oral Daily  . Ensure Max Protein  11 oz Oral Daily  . senna-docusate  2 tablet Oral BID  . sodium chloride flush  10-40 mL Intracatheter Q12H    Continuous Infusions: .  ceFAZolin (ANCEF) IV 2 g (06/03/19 0700)     LOS: 10 days     Kayleen Memos, MD [facility] Pager [phone]  If 7PM-7AM, please contact night-coverage [email or web address] Password [facility] 06/03/2019, 9:13 AM

## 2019-06-03 NOTE — Progress Notes (Signed)
Noted elevated diastolic blood pressure.Followed up with day shift Nurse Trilby Drummer. Pt washing up earlier and will call when complete. Norvasc given see mar.

## 2019-06-03 NOTE — Progress Notes (Signed)
PT Cancellation Note  Patient Details Name: Taylor Hughes MRN: [id number] DOB: April 11, 1971   Cancelled Treatment:    Reason Eval/Treat Not Completed: Patient declined, no reason specified. Pt declined PT session at this time due to fatigue and general drowsiness. Pt educated on importance of ambulation and mobility to preserve strength and reduce swelling. PT will continue to follow and circle back to see pt as time/schedule allow.   Mickey Farber, PT, DPT   Acute Rehabilitation Department [phone]  06/03/2019, 11:34 AM

## 2019-06-03 NOTE — Progress Notes (Signed)
Per Dr Kipp Brood, he will do the wound vac dressing change on Monday.

## 2019-06-03 NOTE — TOC Progression Note (Signed)
Transition of Care [facility]) - Progression Note    Patient Details  Name: Paden Hayden MRN: [id number] Date of Birth: 26-Apr-1971  Transition of Care [facility]) CM/SW [place], [place] Phone Number: [phone] 06/03/2019, 1:20 PM  Clinical Narrative:     CSW spoke with Dorian Pod at [facility] who reports they are able to accept patient for Home Health RN and PT. She reports if patient discharges over the weekend to CALL her to discuss discharge plan before discharging.   Expected Discharge Plan: [place] Barriers to Discharge: Inadequate or no insurance, Equipment Delay  Expected Discharge Plan and Services Expected Discharge Plan: [place] Choice: Durable Medical Equipment, Home Health Living arrangements for the past 2 months: Single Family Home                 DME Arranged: 3-N-1, Tub bench DME Agency: [employer] Date DME Agency Contacted: 06/02/19 Time DME Agency Contacted: [phone] Representative spoke with at DME Agency: [place]: RN, PT [place] Agency: [facility] [place] Date [place]: 06/02/19 Time [place]: 1303 Representative spoke with at [place]: [place] ([place]) Interventions    Readmission Risk Interventions No flowsheet data found.

## 2019-06-03 NOTE — Discharge Instructions (Signed)
Negative Pressure Wound Therapy Home Guide Negative pressure wound therapy (NPWT) uses a sponge or foam-like material (dressing) placed on or inside the wound. The wound is then covered and sealed with a cover dressing that sticks to your skin (is adhesive). This keeps air out. A tube is attached to the cover dressing, and this tube connects to a small pump. The pump sucks fluid and germs from the wound. NPWT helps to increase blood flow to the wound and heal it from the inside. What are the risks? NPWT is usually safe to use. However, problems can occur, including:  Skin irritation from the dressing adhesive.  Bleeding.  Infection.  Dehydration. Wounds with large amounts of drainage can cause excessive fluid loss.  Pain. General tips and recommendations If the alarm sounds:  Stay calm.  Do not turn off the pump or do anything with the dressing.  Reasons the alarm may go off: ? The battery is low. Change the battery or plug the device into electrical power. ? The dressing has a leak. Find the leak and put tape over the leak. ? The fluid collection container is full. Change the fluid container.  Call your health care provider right away if you cannot fix the problem.  Explain to your health care provider what is happening. Follow his or her instructions. General instructions  Do not turn off the pump unless told to do so by your health care provider.  Do not turn off the pump for more than 2 hours. If the pump is off for more than 2 hours, the dressing will need to be changed.  If your health care provider says it is okay to shower: ? Do not take the pump into the shower. ? Make sure the wound dressing is protected and sealed. The wound dressing must stay dry.  Check frequently that the machine indicates that therapy is on and that all clamps are open.  Do not use over-the-counter medicated or antiseptic creams, sprays, liquids, or dressings unless your health care provider  approves. Contact a health care provider if:  You have new pain.  You develop irritation, a rash, or itching around the wound or dressing.  You see new black or yellow tissue in your wound.  The dressing changes are painful or cause bleeding.  The pump has been off for more than 2 hours, and you do not know how to change the dressing.  The pump alarm goes off, and you do not know what to do. Get help right away if:  You have a lot of bleeding.  The wound breaks open.  You have severe pain.  You have signs of infection, such as: ? More redness, swelling, or pain. ? More fluid or blood. ? Warmth. ? Pus or a bad smell. ? Red streaks leading from the wound. ? A fever.  You see a sudden change in the color or texture of the drainage.  You have signs of dehydration, such as: ? Little or no tears, urine, or sweat. ? Muscle cramps. ? Very dry mouth. ? Headache. ? Dizziness. Summary  Negative pressure wound therapy (NPWT) is a device that helps your wound heal.  Set up a clean station for wound care. Your health care provider will tell you what supplies to use.  Follow your health care provider's instructions on how to clean your wound and how to change the dressing.  Contact a health care provider if you have new pain, an irritation, or a rash, or  if the alarm goes off and you do not know what to do.  Get help right away if you have a lot of bleeding, your wound breaks open, or you have severe pain. Also, get help if you have signs of infection. This information is not intended to replace advice given to you by your health care provider. Make sure you discuss any questions you have with your health care provider. Document Released: 10/20/2011 Document Revised: 11/19/2018 Document Reviewed: 10/15/2018 [employer] Patient Education  [employer].

## 2019-06-03 NOTE — Plan of Care (Signed)
  Problem: Skin Integrity: Goal: Risk for impaired skin integrity will decrease Outcome: Progressing   Problem: Pain Managment: Goal: General experience of comfort will improve Outcome: Progressing   

## 2019-06-03 NOTE — Progress Notes (Signed)
Late entry: 06/02/19 @1700  Dr [place] Crane notified that TEDS size [place] is not avail at materials and cannot order it from the outside source either. Calf measurement 52 cm, ankle 20.6 cm bilateral.

## 2019-06-04 LAB — AEROBIC/ANAEROBIC CULTURE W GRAM STAIN (SURGICAL/DEEP WOUND)

## 2019-06-04 LAB — GLUCOSE, CAPILLARY
Glucose-Capillary: 260 mg/dL — ABNORMAL HIGH (ref 70–99)
Glucose-Capillary: 212 mg/dL — ABNORMAL HIGH (ref 70–99)
Glucose-Capillary: 229 mg/dL — ABNORMAL HIGH (ref 70–99)
Glucose-Capillary: 257 mg/dL — ABNORMAL HIGH (ref 70–99)

## 2019-06-04 MED ORDER — AMLODIPINE BESYLATE 10 MG PO TABS
10.0000 mg | ORAL_TABLET | Freq: Every day | ORAL | Status: DC
  Administered 2019-06-05 – 2019-06-07 (×3): 10 mg via ORAL
  Filled 2019-06-04 (×3): qty 1

## 2019-06-04 MED ORDER — AMLODIPINE BESYLATE 5 MG PO TABS
5.0000 mg | ORAL_TABLET | Freq: Once | ORAL | Status: AC
  Administered 2019-06-04: 5 mg via ORAL
  Filled 2019-06-04: qty 1

## 2019-06-04 NOTE — Progress Notes (Signed)
PROGRESS NOTE  Taylor Hughes [id number] DOB: 1971/01/09 DOA: 05/23/2019 PCP: Charlott Rakes, MD  HPI/Recap of past 24 hours: 48 yo male whopresented with fever and left anterior chest wall pain. 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 Patient reports fevers and chest pain for about 2 days. No history of local trauma. On his initial physical examination he was febrile 100.5 F.Tenderness in the left anterior chest wall with positive local induration. 2 opening wounds in the mid upper anterior thorax.  Patient was admitted to the hospital working diagnosis of sepsis due to left pectoralis muscle cellulitis/abscess.  Was started empirically on Vancomycin and Zosyn.   Transferred to [facility] from [facility] for CTS evaluation and possible debridement.   Post left chest wall abscess I&D with placement of ACell matrix and wound VAC on 05/30/19 by Dr. Kipp Brood.  06/01/19, ID consulted to assist with antibiotics for disposition.  Polymicrobial deep tissue infection with group A strep and MSSA to left chest wall extending down to the joint.  Hospital course complicated by uncontrolled hypertension for which Norvasc 5 mg daily was added.  Type 2 diabetes complicated by hyperglycemia and hypoglycemia requiring adjustment of insulin coverage.    06/04/19: Patient was seen and examined at his bedside this morning.  No acute events overnight.  Planned wound vac dressing change by CTS, Dr. Kipp Brood on Monday, 06/06/2019.  Case manager assisting with disposition to home with home health services.  Assessment/Plan: Active Problems:   Essential hypertension   Type 2 diabetes mellitus with other specified complication (HCC)   Sepsis ([place])  POD #5 post left chest wall abscess I&D with placement of ACell matrix and wound VAC on 05/30/19 by Dr. Kipp Brood. Continue to optimize pain  control Continue to monitor wound vac output Deep wound culture 05/30/2019, abscess pectoralis grew staphylococcus pansensitive. Continue IV antibiotics as recommended by ID. On IV ancef. Abscess wound culture obtained on 05/24/19.  Grew staph aureus and group A strep pansensitive.   PICC line placement on 06/02/2019 CTS contacted on 06/02/2019 for possible reassessment of chest wound with drainage.  Discussed with Dr. Kipp Brood. Planned wound vac dressing change on Monday, 06/06/19 Will continue infusions at home Case manager assisting with disposition/home health services.  Acute on chronic bilateral lower extremity edema Unable to order compression stockings due to unavailable size. Unna boots placed on 06/03/19 Discussed case with Dr. Meda Coffee who reviewed his last TTE done in 2019. No heart failure diagnosis. Recommended weight loss, low sodium diet. Last 2D echo on 07/30/2018 showed LVEF 65 to 70%  Suspected undiagnosed OSA Based on body habitus self-reported witnessed snoring Recommend polysomnography, will need pulmonology referral from PCP  Polymicrobial deep tissue infection with croup a strep and MSSA to left chest wall extended down to the joint Infectious disease recommends 6 weeks of IV cefazolin, starting date date 05/30/2019 PICC line in place OPAT per pharmacy  Resolving Sepsis secondary to left pectoralis muscle cellulitis/abscess  Presented with fever, tachycardia and tachypnea CT chest with contrast done on 05/31/2019 showed: There is a 5.6 x 2.8 cm ill-defined low attenuating collection in the left pectoralis major which may represent an old hematoma or infected fluid. This appears to extend more superiorly and medially to the left sternoclavicular joint. There is irregularity and fragmentation of the left sternoclavicular joint which may represent an infectious process. Initially was seen by general surgery, signed off on 05/26/2019 Transferred  to [facility] on  05/26/2019 for possible debridement by CTS. Completed broad-spectrum antibiotics IV vancomycin and IV Zosyn empirically. Blood cultures x2 drawn peripherally taken on 05/23/2019 no growth final. Aerobic culture taken from superficial specimen, wound abscess taken on 05/24/2019: Rare Staphylococcus aureus and rare group A strep.  Pansensitive. MRSA screening negative. Continue to maximize pain control Post I&D and surgical debridement on Monday, 05/30/2019 by CTS.  Microbiology data as stated above Sepsis criteria is resolving  Polyneuropathy Continue gabapentin 300 mg BID  Improving AKI, suspect prerenal secondary to poor oral intake Baseline creatinine appears to be 0.9 with GFR greater than 60 Creatinine 2.42 on 05/29/2019 with GFR of 36 Creatinine 2.53 with GFR of 34 on 05/30/2019 Creatinine 2.2 with GFR of 39 on 05/31/2019 Cr 1.96 on 06/01/19 with GFR46  Creatinine 1.59 on 06/02/2019 with GFR 59 Continue to avoid nephrotoxins such as NSAIDs Obtain BMP in the am  Health screening HIV screening negative 2019  Resolved Non anion gap metabolic acidosis likely secondary to acute renal failure Chemistry bicarb 21 and anion gap of 11 on 05/30/2019 Chemistry bicarb 24 and AG 9 on 06/01/19 Chemistry bicarb 24 and anion gap of 10 on 06/02/2019. Continue to closely monitor if not improved consider bicarb replacement Completed p.o. bicarb replacement   Transient hypoglycemia likely secondary to poor oral intake Decreased dose of insulin coverage Continue insulin sliding scale as needed Goal CBG between 140 and 180  Insulin-dependent type 2 diabetes with hyperglycemia Hemoglobin A1c 11.4 on 11/25/2018 Hemoglobin A1c 12.7 on 05/27/2019. Continue insulin coverage Goal blood sugar between 140 and 180  Mild acute blood loss anemia Hemoglobin stable 10.7 on 05/31/19 from 10.0 on 05/29/2019 No sign of overt bleeding  Resolved Suspected situational anxiety No prior history of chronic  anxiety or psych illness  Resolved intractable nausea and vomiting, unclear etiology  Chronic normocytic anemia Hemoglobin appears to be at his baseline 10.4 with MCV of 91 on 05/28/2019 No sign of overt bleeding Drop H&H 10.0 on 05/29/2019. Continue to monitor H&H  Obesity BMI 36 Recommend weight loss outpatient with regular physical activity and healthy dieting  Uncontrolled hypertension Blood pressure is currently not at goal Currently on Lopressor 50 mg twice daily and Norvasc 5 mg daily. Increase Norvasc to 10 mg daily Continue to monitor vital signs.  DVT prophylaxis:Subcu Heparin 5000 U 3 times daily.  Code Status:full Family Communication:None at bedside.  Disposition Plan: Patient is currently not appropriate for discharge at this time due to ongoing treatment for pectoralis abscess, possibly home back dressing change on Monday, 06/05/2024 CTS.  Will discharge to home with home health services on 06/06/2019 after dressing changes if hemodynamically stable.   Consultants:   Surgery   CT surgery  ID   Procedures:   I&D.  Antimicrobials:   Ancef   Objective: Vitals:   06/03/19 2140 06/04/19 0636 06/04/19 0849 06/04/19 1623  BP: (!) 150/88 (!) 166/97 (!) 170/106 (!) 154/109  Pulse: 85 81 84 89  Resp: 18 18    Temp: 98.4 F (36.9 C) 98.6 F (37 C) 98.7 F (37.1 C) 98.2 F (36.8 C)  TempSrc: Oral Oral Oral Oral  SpO2: 100% 100% 97% 100%  Weight:  120.7 kg    Height:        Intake/Output Summary (Last 24 hours) at 06/04/2019 1632 Last data filed at 06/04/2019 1600 Gross per 24 hour  Intake 740 ml  Output 2355 ml  Net -1615 ml   Filed Weights   05/30/19  [id number] 06/02/19 0340 06/04/19 0636  Weight: 117.8 kg 118.7 kg 120.7 kg    Exam:   General: 48 y.o. year-old male Pleasant well-developed well-nourished in no acute distress.  Alert and oriented x4.  Cardiovascular: Regular rate and rhythm no rubs or gallops no JVD or thyromegaly  noted.  Chest with wound VAC and mild cloudy drainage.    Respiratory: Clear to auscultation no wheezes or rales.  Poor inspiratory effort.  Abdomen: Soft nontender nondistended normal bowel sounds present.    Musculoskeletal:  lower extremities are in Unna boots bilaterally.  Psychiatry: Mood is appropriate for condition and setting.   Data Reviewed: CBC: Recent Labs  Lab 05/29/19 0442 05/31/19 0743  WBC 7.1 6.6  NEUTROABS 5.4  --   HGB 10.0* 10.7*  HCT 30.6* 31.8*  MCV 89.0 88.6  PLT 365 [id number]*   Basic Metabolic Panel: Recent Labs  Lab 05/29/19 0442 05/30/19 0229 05/31/19 0743 06/01/19 0245 06/02/19 0318  NA 138 137 136 138 138  K 3.8 4.0 4.3 4.4 4.3  CL 106 105 101 105 104  CO2 22 21* 21* 24 24  GLUCOSE 171* 241* 285* 124* 252*  BUN 8 9 14 14 13   CREATININE 2.42* 2.53* 2.25* 1.96* 1.59*  CALCIUM 8.2* 8.0* 8.4* 8.6* 8.2*  MG 1.8  --   --   --   --   PHOS 3.5  --   --   --   --    GFR: Estimated Creatinine Clearance: 76 mL/min (A) (by C-G formula based on SCr of 1.59 mg/dL (H)). Liver Function Tests: Recent Labs  Lab 05/29/19 0442  AST 16  ALT 8  ALKPHOS 65  BILITOT 0.6  PROT 7.2  ALBUMIN 2.0*   No results for input(s): LIPASE, AMYLASE in the last 168 hours. No results for input(s): AMMONIA in the last 168 hours. Coagulation Profile: Recent Labs  Lab 05/30/19 0229  INR 1.2   Cardiac Enzymes: No results for input(s): CKTOTAL, CKMB, CKMBINDEX, TROPONINI in the last 168 hours. BNP (last 3 results) No results for input(s): PROBNP in the last 8760 hours. HbA1C: No results for input(s): HGBA1C in the last 72 hours. CBG: Recent Labs  Lab 06/03/19 1207 06/03/19 1728 06/03/19 2135 06/04/19 0632 06/04/19 1226  GLUCAP 326* 121* 166* 260* 212*   Lipid Profile: No results for input(s): CHOL, HDL, LDLCALC, TRIG, CHOLHDL, LDLDIRECT in the last 72 hours. Thyroid Function Tests: No results for input(s): TSH, T4TOTAL, FREET4, T3FREE, THYROIDAB in the  last 72 hours. Anemia Panel: No results for input(s): VITAMINB12, FOLATE, FERRITIN, TIBC, IRON, RETICCTPCT in the last 72 hours. Urine analysis:    Component Value Date/Time   COLORURINE YELLOW 05/24/2019 0106   APPEARANCEUR HAZY (A) 05/24/2019 0106   LABSPEC >1.046 (H) 05/24/2019 0106   PHURINE 5.0 05/24/2019 0106   GLUCOSEU 150 (A) 05/24/2019 0106   HGBUR SMALL (A) 05/24/2019 0106   BILIRUBINUR NEGATIVE 05/24/2019 0106   KETONESUR 20 (A) 05/24/2019 0106   PROTEINUR 30 (A) 05/24/2019 0106   NITRITE NEGATIVE 05/24/2019 0106   LEUKOCYTESUR NEGATIVE 05/24/2019 0106   Sepsis Labs: @LABRCNTIP (procalcitonin:4,lacticidven:4)  ) Recent Results (from the past 240 hour(s))  MRSA PCR Screening     Status: None   Collection Time: 05/27/19  1:00 PM   Specimen: Nasal Mucosa; Nasopharyngeal  Result Value Ref Range Status   MRSA by PCR NEGATIVE NEGATIVE Final    Comment:        The GeneXpert MRSA Assay (FDA approved for NASAL specimens  only), is one component of a comprehensive MRSA colonization surveillance program. It is not intended to diagnose MRSA infection nor to guide or monitor treatment for MRSA infections. Performed at [facility], [place] [address] [place], [place] [address]   Surgical pcr screen     Status: None   Collection Time: 05/30/19  1:34 AM   Specimen: Nasal Mucosa; Nasal Swab  Result Value Ref Range Status   MRSA, PCR NEGATIVE NEGATIVE Final   Staphylococcus aureus NEGATIVE NEGATIVE Final    Comment: (NOTE) The Xpert SA Assay (FDA approved for NASAL specimens in patients 49 years of age and older), is one component of a comprehensive surveillance program. It is not intended to diagnose infection nor to guide or monitor treatment. Performed at [facility], [place] [address], [place], [place] [address]   Aerobic/Anaerobic Culture (surgical/deep wound)     Status: None   Collection Time: 05/30/19 10:19 AM   Specimen: PATH Soft tissue  Result Value  Ref Range Status   Specimen Description TISSUE ABSCESS CHEST WALL  Final   Special Requests A  Final   Gram Stain   Final    RARE WBC PRESENT, PREDOMINANTLY PMN NO ORGANISMS SEEN    Culture   Final    RARE STAPHYLOCOCCUS AUREUS CRITICAL RESULT CALLED TO, READ BACK BY AND VERIFIED WITH: RN S DEAVER [id number] AT 1432 BY CM NO ANAEROBES ISOLATED Performed at [facility], [place] [address] [place],  [address]    Report Status 06/04/2019 FINAL  Final   Organism ID, Bacteria STAPHYLOCOCCUS AUREUS  Final      Susceptibility   Staphylococcus aureus - MIC*    CIPROFLOXACIN <=0.5 SENSITIVE Sensitive     ERYTHROMYCIN 0.5 SENSITIVE Sensitive     GENTAMICIN <=0.5 SENSITIVE Sensitive     OXACILLIN 0.5 SENSITIVE Sensitive     TETRACYCLINE <=1 SENSITIVE Sensitive     VANCOMYCIN 1 SENSITIVE Sensitive     TRIMETH/SULFA <=10 SENSITIVE Sensitive     CLINDAMYCIN <=0.25 SENSITIVE Sensitive     RIFAMPIN <=0.5 SENSITIVE Sensitive     Inducible Clindamycin NEGATIVE Sensitive     * RARE STAPHYLOCOCCUS AUREUS  Aerobic/Anaerobic Culture (surgical/deep wound)     Status: None   Collection Time: 05/30/19 10:34 AM   Specimen: PATH Other; Body Fluid  Result Value Ref Range Status   Specimen Description ABSCESS PECTORALIS  Final   Special Requests B  Final   Gram Stain   Final    MODERATE WBC PRESENT, PREDOMINANTLY PMN FEW GRAM POSITIVE COCCI IN PAIRS    Culture   Final    MODERATE STAPHYLOCOCCUS AUREUS NO ANAEROBES ISOLATED Performed at [facility], [address], [place],  [address]    Report Status 06/04/2019 FINAL  Final   Organism ID, Bacteria STAPHYLOCOCCUS AUREUS  Final      Susceptibility   Staphylococcus aureus - MIC*    CIPROFLOXACIN <=0.5 SENSITIVE Sensitive     ERYTHROMYCIN <=0.25 SENSITIVE Sensitive     GENTAMICIN <=0.5 SENSITIVE Sensitive     OXACILLIN 0.5 SENSITIVE Sensitive     TETRACYCLINE <=1 SENSITIVE Sensitive     VANCOMYCIN 1 SENSITIVE Sensitive      TRIMETH/SULFA <=10 SENSITIVE Sensitive     CLINDAMYCIN <=0.25 SENSITIVE Sensitive     RIFAMPIN <=0.5 SENSITIVE Sensitive     Inducible Clindamycin NEGATIVE Sensitive     * MODERATE STAPHYLOCOCCUS AUREUS      Studies: No results found.  Scheduled Meds:  amLODipine  5 mg Oral Daily   Chlorhexidine Gluconate Cloth  6 each Topical Daily   gabapentin  300 mg Oral BID   heparin injection (subcutaneous)  5,000 Units Subcutaneous Q8H   insulin aspart  0-5 Units Subcutaneous QHS   insulin aspart  0-9 Units Subcutaneous TID WC   insulin aspart  4 Units Subcutaneous TID WC   insulin detemir  18 Units Subcutaneous Daily   metoprolol tartrate  50 mg Oral BID   multivitamin with minerals  1 tablet Oral Daily   Ensure Max Protein  11 oz Oral Daily   senna-docusate  2 tablet Oral BID   sodium chloride flush  10-40 mL Intracatheter Q12H    Continuous Infusions:   ceFAZolin (ANCEF) IV 2 g (06/04/19 1507)     LOS: 11 days     Kayleen Memos, MD [facility] Pager [phone]  If 7PM-7AM, please contact night-coverage [email or web address] Password [email or web address] 06/04/2019, 4:32 PM    PROGRESS NOTE  Taylor Hughes [id number] DOB: 07/12/1971 DOA: 05/23/2019 PCP: Charlott Rakes, MD  HPI/Recap of past 24 hours: 48 yo male whopresented with fever and left anterior chest wall pain. 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 Patient reported fevers and chest pain for about 2 days. No history of local trauma. On his initial physical examination he was febrile 100.5 F, blood pressure 110/75, heart rate 108, respiratory 24, oxygen saturation 93%, his lungs are clear to auscultation bilaterally, heart S1-S2 present rhythmic, abdomen soft, no lower extremity edema. Tenderness in the left anterior chest wall with positive local induration. 2 opening wounds in  the mid upper anterior thorax. Sodium134, potassium 3.6, chloride 98, bicarb 23,glucose 128, BUN 16, creatinine 1.0, lactic acid 2.2, white cell count 8.5, hemoglobin 11.7, hematocrit 35.7, platelets 350. SARSCOVID-19 was negative.Urine analysis specific gravity more than 1.046, 30 protein,glucose 150.CT chest with old hematoma/infected fluid left pectoralis major,collection extending superiorly and medially to the left sternoclavicular joint.  Patient was admitted to the hospital working diagnosis of sepsis due to left pectoralis muscle cellulitis/abscess.  Patient tolerating weill IV antibiotic therapy with Vancomycin and Zosyn, he has remained afebrile with no leukocytosis. Persistent hard collection on his left anterior chest wall.  Transferred to [facility] from [facility] for debridement.   CTS made aware of the patient's arrival at [facility] and will see in consultation.  Seen by CTS with plan for I&D and surgical debridement on Monday, 05/30/2019.  POD #0 post left chest wall abscess I&D with placement of ACell matrix and wound VAC on 05/30/19 by Dr. Kipp Brood.  06/01/19, ID consulted to assist with antibiotics in the setting of polymicrobial deep tissue infection with group A strep and MSSA to left chest wall extending down to the joint.  Left chest wound with drainage, contacted CTS for reassessment 06/02/19. Discussed with Dr. Kipp Brood.  06/03/19: Patient was seen and examined at his bedside this morning.  Denies pain at this time.  Drainage noted at his left chest wound.  Uncontrolled hypertension for which Norvasc 5 mg daily was added this morning.  Reports increased swelling in his lower extremities bilaterally.  Bilateral lower extremity edema chronic but worse.  Cardiology consulted to further assess.   Assessment/Plan: Active Problems:   Essential hypertension   Type 2 diabetes mellitus with other specified complication (HCC)   Sepsis ([place])  POD #4 post left chest wall abscess  I&D with  placement of ACell matrix and wound VAC on 05/30/19 by Dr. Kipp Brood. Continue to optimize pain control Continue to monitor wound vac fluid output Deep wound culture 05/30/2019, abscess pectoralis grew staphylococcus pansensitive. Continue IV antibiotics Abscess wound culture obtained on 05/24/19.  Grew staph aureus and group A strep pansensitive.   Currently on Ancef ID consulted on 06/01/19. Appreciate assistance. Plan for PICC line placement on 06/02/2019 CTS contacted on 06/02/2019 for possible reassessment of chest wound with drainage.  Discussed with Dr. Kipp Brood.  Acute on chronic bilateral lower extremity edema Unable to order compression stockings due to unavailable size. Last 2D echo on 07/30/2018 showed LVEF 65 to 70% Cardiology consulted to further assess  Suspected undiagnosed OSA Based on body habitus self-reported witnessed snoring Recommend polysomnography, will need referral from PCP  Polymicrobial deep tissue infection with croup a strep and MSSA to left chest wall extended down to the joint Infectious disease recommends 6 weeks of IV cefazolin, starting date date 05/30/2019 Open orders PICC line placement OPAT per pharmacy Will follow-up with CTS outpatient  Sepsis secondary to left pectoralis muscle cellulitis/abscess  Presented with fever, tachycardia and tachypnea CT chest with contrast done on 05/31/2019 showed: There is a 5.6 x 2.8 cm ill-defined low attenuating collection in the left pectoralis major which may represent an old hematoma or infected fluid. This appears to extend more superiorly and medially to the left sternoclavicular joint. There is irregularity and fragmentation of the left sternoclavicular joint which may represent an infectious process. Initially was seen by general surgery, signed off on 05/26/2019 Transferred to [facility] on 05/26/2019 for possible debridement by CTS. Continue broad-spectrum antibiotics IV vancomycin and IV Zosyn  empirically. Sepsis physiology is resolving on IV antibiotics, T-max 99.4 Blood cultures x2 drawn peripherally taken on 05/23/2019 no growth final. Aerobic culture taken from superficial specimen, wound abscess taken on 05/24/2019: Rare Staphylococcus aureus and rare group A strep.  Pansensitive. MRSA screening negative. Continue to maximize pain control Post I&D and surgical debridement on Monday, 05/30/2019 by CTS.  Microbiology data as stated above  Polyneuropathy Continue gabapentin 300 mg BID  Improving AKI, suspect prerenal secondary to poor oral intake Baseline creatinine appears to be 0.9 with GFR greater than 60 Creatinine 2.42 on 05/29/2019 with GFR of 36 Creatinine 2.53 with GFR of 34 on 05/30/2019 Creatinine 2.2 with GFR of 39 on 05/31/2019 Cr 1.96 on 06/01/19 with GFR46  Creatinine 1.59 on 06/02/2019 with GFR 59 Continue to avoid nephrotoxins such as NSAIDs  Health screening HIV screening negative 2019  Resolved Non anion gap metabolic acidosis likely secondary to acute renal failure Chemistry bicarb 21 and anion gap of 11 on 05/30/2019 Chemistry bicarb 24 and AG 9 on 06/01/19 Chemistry bicarb 24 and anion gap of 10 on 06/02/2019. Continue to closely monitor if not improved consider bicarb replacement Completed p.o. bicarb replacement   Transient hypoglycemia likely secondary to poor oral intake Decreased dose of insulin coverage Continue insulin sliding scale as needed Goal CBG between 140 and 180  Insulin-dependent type 2 diabetes with hyperglycemia Hemoglobin A1c 11.4 on 11/25/2018 Hemoglobin A1c 12.7 on 05/27/2019. Continue insulin coverage Goal blood sugar between 140 and 180  Mild acute blood loss anemia Hemoglobin stable 10.7 from 10.0 on 05/29/2019 No sign of overt bleeding  Suspected situational anxiety No prior history of chronic anxiety or psych illness  Resolved intractable nausea and vomiting, unclear etiology  Chronic diastolic CHF Last 2D  echo done on 07/30/2018 showed LVEF 65 to 70% Continue strict I's  and O's and daily weight Monitor volume status Continue metoprolol  Chronic normocytic anemia Hemoglobin appears to be at his baseline 10.4 with MCV of 91 on 05/28/2019 No sign of overt bleeding Drop H&H 10.0 on 05/29/2019. Continue to monitor H&H  Obesity BMI 36 Recommend weight loss outpatient with regular physical activity and healthy dieting  Essential hypertension Blood pressure is currently at goal. Continue metoprolol at home dose Continue to monitor vital signs  DVT prophylaxis:Subcu Heparin 5000 U 3 times daily.  Code Status:full Family Communication:no family at the bedside Disposition Plan: Patient is currently not appropriate for discharge at this time due to ongoing treatment for pectoralis abscess, possibly requiring debridement by cardiothoracic surgery.  Will discharge to home when CTS signs off and patient is hemodynamically stable.  Body mass index is 36.26 kg/m.    Consultants:   Surgery   CT surgery  ID   Procedures:     Antimicrobials:   Ancef   Objective: Vitals:   06/03/19 2140 06/04/19 0636 06/04/19 0849 06/04/19 1623  BP: (!) 150/88 (!) 166/97 (!) 170/106 (!) 154/109  Pulse: 85 81 84 89  Resp: 18 18    Temp: 98.4 F (36.9 C) 98.6 F (37 C) 98.7 F (37.1 C) 98.2 F (36.8 C)  TempSrc: Oral Oral Oral Oral  SpO2: 100% 100% 97% 100%  Weight:  120.7 kg    Height:        Intake/Output Summary (Last 24 hours) at 06/04/2019 1632 Last data filed at 06/04/2019 1600 Gross per 24 hour  Intake 740 ml  Output 2355 ml  Net -1615 ml   Filed Weights   05/30/19 0416 06/02/19 0340 06/04/19 0636  Weight: 117.8 kg 118.7 kg 120.7 kg    Exam:   General: 48 y.o. year-old male well-developed well-nourished no acute distress.  Alert oriented x4.    Cardiovascular: Regular rate and rhythm no rubs or gallops no JVD or thyromegaly noted.  Chest with wound VAC and  cloudy drainage.    Respiratory: Clear to auscultation no wheezes or rales.    Abdomen: Soft nontender nondistended with bowel sounds present.    Musculoskeletal: 2+ pitting edema in lower extremities bilaterally.  Psychiatry: Mood is appropriate for condition and setting..   Data Reviewed: CBC: Recent Labs  Lab 05/29/19 0442 05/31/19 0743  WBC 7.1 6.6  NEUTROABS 5.4  --   HGB 10.0* 10.7*  HCT 30.6* 31.8*  MCV 89.0 88.6  PLT 365 [id number]*   Basic Metabolic Panel: Recent Labs  Lab 05/29/19 0442 05/30/19 0229 05/31/19 0743 06/01/19 0245 06/02/19 0318  NA 138 137 136 138 138  K 3.8 4.0 4.3 4.4 4.3  CL 106 105 101 105 104  CO2 22 21* 21* 24 24  GLUCOSE 171* 241* 285* 124* 252*  BUN 8 9 14 14 13   CREATININE 2.42* 2.53* 2.25* 1.96* 1.59*  CALCIUM 8.2* 8.0* 8.4* 8.6* 8.2*  MG 1.8  --   --   --   --   PHOS 3.5  --   --   --   --    GFR: Estimated Creatinine Clearance: 76 mL/min (A) (by C-G formula based on SCr of 1.59 mg/dL (H)). Liver Function Tests: Recent Labs  Lab 05/29/19 0442  AST 16  ALT 8  ALKPHOS 65  BILITOT 0.6  PROT 7.2  ALBUMIN 2.0*   No results for input(s): LIPASE, AMYLASE in the last 168 hours. No results for input(s): AMMONIA in the last 168 hours. Coagulation Profile: Recent  Labs  Lab 05/30/19 0229  INR 1.2   Cardiac Enzymes: No results for input(s): CKTOTAL, CKMB, CKMBINDEX, TROPONINI in the last 168 hours. BNP (last 3 results) No results for input(s): PROBNP in the last 8760 hours. HbA1C: No results for input(s): HGBA1C in the last 72 hours. CBG: Recent Labs  Lab 06/03/19 1207 06/03/19 1728 06/03/19 2135 06/04/19 0632 06/04/19 1226  GLUCAP 326* 121* 166* 260* 212*   Lipid Profile: No results for input(s): CHOL, HDL, LDLCALC, TRIG, CHOLHDL, LDLDIRECT in the last 72 hours. Thyroid Function Tests: No results for input(s): TSH, T4TOTAL, FREET4, T3FREE, THYROIDAB in the last 72 hours. Anemia Panel: No results for input(s):  VITAMINB12, FOLATE, FERRITIN, TIBC, IRON, RETICCTPCT in the last 72 hours. Urine analysis:    Component Value Date/Time   COLORURINE YELLOW 05/24/2019 0106   APPEARANCEUR HAZY (A) 05/24/2019 0106   LABSPEC >1.046 (H) 05/24/2019 0106   PHURINE 5.0 05/24/2019 0106   GLUCOSEU 150 (A) 05/24/2019 0106   HGBUR SMALL (A) 05/24/2019 0106   BILIRUBINUR NEGATIVE 05/24/2019 0106   KETONESUR 20 (A) 05/24/2019 0106   PROTEINUR 30 (A) 05/24/2019 0106   NITRITE NEGATIVE 05/24/2019 0106   LEUKOCYTESUR NEGATIVE 05/24/2019 0106   Sepsis Labs: @LABRCNTIP (procalcitonin:4,lacticidven:4)  ) Recent Results (from the past 240 hour(s))  MRSA PCR Screening     Status: None   Collection Time: 05/27/19  1:00 PM   Specimen: Nasal Mucosa; Nasopharyngeal  Result Value Ref Range Status   MRSA by PCR NEGATIVE NEGATIVE Final    Comment:        The GeneXpert MRSA Assay (FDA approved for NASAL specimens only), is one component of a comprehensive MRSA colonization surveillance program. It is not intended to diagnose MRSA infection nor to guide or monitor treatment for MRSA infections. Performed at [facility], [place] [address], [place], [place] [address]   Surgical pcr screen     Status: None   Collection Time: 05/30/19  1:34 AM   Specimen: Nasal Mucosa; Nasal Swab  Result Value Ref Range Status   MRSA, PCR NEGATIVE NEGATIVE Final   Staphylococcus aureus NEGATIVE NEGATIVE Final    Comment: (NOTE) The Xpert SA Assay (FDA approved for NASAL specimens in patients 50 years of age and older), is one component of a comprehensive surveillance program. It is not intended to diagnose infection nor to guide or monitor treatment. Performed at [facility], [place] [address], [place], [place] [address]   Aerobic/Anaerobic Culture (surgical/deep wound)     Status: None   Collection Time: 05/30/19 10:19 AM   Specimen: PATH Soft tissue  Result Value Ref Range Status   Specimen Description TISSUE ABSCESS  CHEST WALL  Final   Special Requests A  Final   Gram Stain   Final    RARE WBC PRESENT, PREDOMINANTLY PMN NO ORGANISMS SEEN    Culture   Final    RARE STAPHYLOCOCCUS AUREUS CRITICAL RESULT CALLED TO, READ BACK BY AND VERIFIED WITH: RN S DEAVER [id number] AT 1432 BY CM NO ANAEROBES ISOLATED Performed at [facility], [place] [address], [place], [place] [address]    Report Status 06/04/2019 FINAL  Final   Organism ID, Bacteria STAPHYLOCOCCUS AUREUS  Final      Susceptibility   Staphylococcus aureus - MIC*    CIPROFLOXACIN <=0.5 SENSITIVE Sensitive     ERYTHROMYCIN 0.5 SENSITIVE Sensitive     GENTAMICIN <=0.5 SENSITIVE Sensitive     OXACILLIN 0.5 SENSITIVE Sensitive  TETRACYCLINE <=1 SENSITIVE Sensitive     VANCOMYCIN 1 SENSITIVE Sensitive     TRIMETH/SULFA <=10 SENSITIVE Sensitive     CLINDAMYCIN <=0.25 SENSITIVE Sensitive     RIFAMPIN <=0.5 SENSITIVE Sensitive     Inducible Clindamycin NEGATIVE Sensitive     * RARE STAPHYLOCOCCUS AUREUS  Aerobic/Anaerobic Culture (surgical/deep wound)     Status: None   Collection Time: 05/30/19 10:34 AM   Specimen: PATH Other; Body Fluid  Result Value Ref Range Status   Specimen Description ABSCESS PECTORALIS  Final   Special Requests B  Final   Gram Stain   Final    MODERATE WBC PRESENT, PREDOMINANTLY PMN FEW GRAM POSITIVE COCCI IN PAIRS    Culture   Final    MODERATE STAPHYLOCOCCUS AUREUS NO ANAEROBES ISOLATED Performed at [facility], [address], [place], [place] [address]    Report Status 06/04/2019 FINAL  Final   Organism ID, Bacteria STAPHYLOCOCCUS AUREUS  Final      Susceptibility   Staphylococcus aureus - MIC*    CIPROFLOXACIN <=0.5 SENSITIVE Sensitive     ERYTHROMYCIN <=0.25 SENSITIVE Sensitive     GENTAMICIN <=0.5 SENSITIVE Sensitive     OXACILLIN 0.5 SENSITIVE Sensitive     TETRACYCLINE <=1 SENSITIVE Sensitive     VANCOMYCIN 1 SENSITIVE Sensitive     TRIMETH/SULFA <=10 SENSITIVE Sensitive     CLINDAMYCIN  <=0.25 SENSITIVE Sensitive     RIFAMPIN <=0.5 SENSITIVE Sensitive     Inducible Clindamycin NEGATIVE Sensitive     * MODERATE STAPHYLOCOCCUS AUREUS      Studies: No results found.  Scheduled Meds:  amLODipine  5 mg Oral Daily   Chlorhexidine Gluconate Cloth  6 each Topical Daily   gabapentin  300 mg Oral BID   heparin injection (subcutaneous)  5,000 Units Subcutaneous Q8H   insulin aspart  0-5 Units Subcutaneous QHS   insulin aspart  0-9 Units Subcutaneous TID WC   insulin aspart  4 Units Subcutaneous TID WC   insulin detemir  18 Units Subcutaneous Daily   metoprolol tartrate  50 mg Oral BID   multivitamin with minerals  1 tablet Oral Daily   Ensure Max Protein  11 oz Oral Daily   senna-docusate  2 tablet Oral BID   sodium chloride flush  10-40 mL Intracatheter Q12H    Continuous Infusions:   ceFAZolin (ANCEF) IV 2 g (06/04/19 1507)     LOS: 11 days     Kayleen Memos, MD [facility] Pager [phone]  If 7PM-7AM, please contact night-coverage [email or web address] Password [email or web address] 06/04/2019, 4:32 PM

## 2019-06-05 LAB — BASIC METABOLIC PANEL
Anion gap: 10 (ref 5–15)
BUN: 25 mg/dL — ABNORMAL HIGH (ref 6–20)
CO2: 26 mmol/L (ref 22–32)
Calcium: 8.6 mg/dL — ABNORMAL LOW (ref 8.9–10.3)
Chloride: 95 mmol/L — ABNORMAL LOW (ref 98–111)
Creatinine, Ser: 1.37 mg/dL — ABNORMAL HIGH (ref 0.61–1.24)
GFR calc Af Amer: >60 mL/min (ref >60)
GFR calc non Af Amer: >60 mL/min (ref >60)
Glucose, Bld: 357 mg/dL — ABNORMAL HIGH (ref 70–99)
Potassium: 4.5 mmol/L (ref 3.5–5.1)
Sodium: 131 mmol/L — ABNORMAL LOW (ref 135–145)

## 2019-06-05 LAB — GLUCOSE, CAPILLARY
Glucose-Capillary: 293 mg/dL — ABNORMAL HIGH (ref 70–99)
Glucose-Capillary: 233 mg/dL — ABNORMAL HIGH (ref 70–99)
Glucose-Capillary: 173 mg/dL — ABNORMAL HIGH (ref 70–99)
Glucose-Capillary: 240 mg/dL — ABNORMAL HIGH (ref 70–99)

## 2019-06-05 MED ORDER — INSULIN DETEMIR 100 UNIT/ML ~~LOC~~ SOLN
25.0000 [IU] | Freq: Every day | SUBCUTANEOUS | Status: DC
  Administered 2019-06-06 – 2019-06-07 (×2): 25 [IU] via SUBCUTANEOUS
  Filled 2019-06-05 (×2): qty 0.25

## 2019-06-05 NOTE — Progress Notes (Signed)
PROGRESS NOTE  Taylor Hughes [id number] DOB: [birth year] DOA: 05/23/2019 PCP: Charlott Rakes, MD  HPI/Recap of past 24 hours: 48 yo male whopresented with fever and left anterior chest wall pain. 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 Patient reports fevers and chest pain for about 2 days. No history of local trauma. On his initial physical examination he was febrile 100.5 F.Tenderness in the left anterior chest wall with positive local induration. 2 opening wounds in the mid upper anterior thorax.  Patient was admitted to the hospital working diagnosis of sepsis due to left pectoralis muscle cellulitis/abscess.  Was started empirically on Vancomycin and Zosyn.   Transferred to [facility] from [facility] for CTS evaluation and possible debridement.   Post left chest wall abscess I&D with placement of ACell matrix and wound VAC on 05/30/19 by Dr. Kipp Brood.  06/01/19, ID consulted to assist with antibiotics for disposition.  Polymicrobial deep tissue infection with group A strep and MSSA to left chest wall extending down to the joint.  Hospital course complicated by uncontrolled hypertension for which Norvasc 5 mg daily was added.  Type 2 diabetes complicated by hyperglycemia and hypoglycemia requiring adjustment of insulin coverage.    06/05/19: Patient doing well.  No acute events overnight.  Planned wound vac dressing change by CTS, Dr. Kipp Brood on Monday, 06/06/2019.  Case manager assisting with disposition to home with home health services.  Assessment/Plan: Active Problems:   Essential hypertension   Type 2 diabetes mellitus with other specified complication (HCC)   Sepsis ([place])  POD #5 post left chest wall abscess I&D with placement of ACell matrix and wound VAC on 05/30/19 by Dr. Kipp Brood. Continue to optimize pain control Continue to monitor wound vac  output Deep wound culture 05/30/2019, abscess pectoralis grew staphylococcus pansensitive. Continue IV antibiotics as recommended by ID. On IV ancef. Abscess wound culture obtained on 05/24/19.  Grew staph aureus and group A strep pansensitive.   PICC line placement on 06/02/2019 CTS contacted on 06/02/2019 for possible reassessment of chest wound with drainage.  Discussed with Dr. Kipp Brood. Planned wound vac dressing change on Monday, 06/06/19 Will continue infusions at home Case manager assisting with disposition/home health services.  Acute on chronic bilateral lower extremity edema Unable to order compression stockings due to unavailable size. Unna boots placed on 06/03/19 Discussed case with Dr. Meda Coffee who reviewed his last TTE done in 2019. No heart failure diagnosis. Recommended weight loss, low sodium diet. Last 2D echo on 07/30/2018 showed LVEF 65 to 70%  Suspected undiagnosed OSA Based on body habitus self-reported witnessed snoring Recommend polysomnography, will need pulmonology referral from PCP  Polymicrobial deep tissue infection with croup a strep and MSSA to left chest wall extended down to the joint Infectious disease recommends 6 weeks of IV cefazolin, starting date date 05/30/2019 PICC line in place OPAT per pharmacy  Resolving Sepsis secondary to left pectoralis muscle cellulitis/abscess  Presented with fever, tachycardia and tachypnea CT chest with contrast done on 05/31/2019 showed: There is a 5.6 x 2.8 cm ill-defined low attenuating collection in the left pectoralis major which may represent an old hematoma or infected fluid. This appears to extend more superiorly and medially to the left sternoclavicular joint. There is irregularity and fragmentation of the left sternoclavicular joint which may represent an infectious process. Initially was seen by general surgery, signed off on 05/26/2019 Transferred to [facility] on 05/26/2019 for possible debridement  by CTS.  Completed broad-spectrum antibiotics IV vancomycin and IV Zosyn empirically. Blood cultures x2 drawn peripherally taken on 05/23/2019 no growth final. Aerobic culture taken from superficial specimen, wound abscess taken on 05/24/2019: Rare Staphylococcus aureus and rare group A strep.  Pansensitive. MRSA screening negative. Continue to maximize pain control Post I&D and surgical debridement on Monday, 05/30/2019 by CTS.  Microbiology data as stated above Sepsis criteria is resolving  Polyneuropathy Continue gabapentin 300 mg BID  Improving AKI, suspect prerenal secondary to poor oral intake Baseline creatinine appears to be 0.9 with GFR greater than 60 Creatinine 2.42 on 05/29/2019 with GFR of 36 Creatinine 2.53 with GFR of 34 on 05/30/2019 Creatinine 2.2 with GFR of 39 on 05/31/2019 Cr 1.96 on 06/01/19 with GFR46  Creatinine 1.59 on 06/02/2019 with GFR 59 Continue to avoid nephrotoxins such as NSAIDs Obtain BMP in the am  Health screening HIV screening negative 2019  Resolved Non anion gap metabolic acidosis likely secondary to acute renal failure Chemistry bicarb 21 and anion gap of 11 on 05/30/2019 Chemistry bicarb 24 and AG 9 on 06/01/19 Chemistry bicarb 24 and anion gap of 10 on 06/02/2019. Continue to closely monitor if not improved consider bicarb replacement Completed p.o. bicarb replacement   Transient hypoglycemia likely secondary to poor oral intake Decreased dose of insulin coverage Continue insulin sliding scale as needed Goal CBG between 140 and 180  Insulin-dependent type 2 diabetes with hyperglycemia Hemoglobin A1c 11.4 on 11/25/2018 Hemoglobin A1c 12.7 on 05/27/2019. Continue insulin coverage Goal blood sugar between 140 and 180  Mild acute blood loss anemia Hemoglobin stable 10.7 on 05/31/19 from 10.0 on 05/29/2019 No sign of overt bleeding  Resolved Suspected situational anxiety No prior history of chronic anxiety or psych illness  Resolved  intractable nausea and vomiting, unclear etiology  Chronic normocytic anemia Hemoglobin appears to be at his baseline 10.4 with MCV of 91 on 05/28/2019 No sign of overt bleeding Drop H&H 10.0 on 05/29/2019. Continue to monitor H&H  Obesity BMI 36 Recommend weight loss outpatient with regular physical activity and healthy dieting  Uncontrolled hypertension Blood pressure is currently not at goal Currently on Lopressor 50 mg twice daily and Norvasc 5 mg daily. Increase Norvasc to 10 mg daily Continue to monitor vital signs.  DVT prophylaxis:Subcu Heparin 5000 U 3 times daily.  Code Status:full Family Communication:None at bedside.  Disposition Plan: Patient is currently not appropriate for discharge at this time due to ongoing treatment for pectoralis abscess, possibly home back dressing change on Monday, 06/05/2024 CTS.  Will discharge to home with home health services on 06/06/2019 after dressing changes if hemodynamically stable.   Consultants:   Surgery   CT surgery  ID   Procedures:   I&D.  Antimicrobials:   Ancef   Objective: Vitals:   06/04/19 1623 06/04/19 1935 06/05/19 0319 06/05/19 0810  BP: (!) 154/109 (!) 149/96 (!) 137/94 (!) 155/99  Pulse: 89 92 83 86  Resp:  18 17   Temp: 98.2 F (36.8 C) 98.6 F (37 C) 98.6 F (37 C) 98.4 F (36.9 C)  TempSrc: Oral Oral Oral Oral  SpO2: 100% 100% 100% 99%  Weight:      Height:        Intake/Output Summary (Last 24 hours) at 06/05/2019 1317 Last data filed at 06/05/2019 0845 Gross per 24 hour  Intake 340 ml  Output 3250 ml  Net -2910 ml   Filed Weights   05/30/19 0416 06/02/19 0340 06/04/19 0636  Weight: 117.8  kg 118.7 kg 120.7 kg    Exam:  . General: 48 y.o. year-old male no acute distress.  Alert and oriented x3. . Cardiovascular: Regular rate.  No murmurs, rubs, gallops.  Chest with wound VAC and mild cloudy drainage.   Marland Kitchen Respiratory: Clear to auscultation.  No wheezes, rales,  rhonchi. . Abdomen: Soft, nontender.,  Nondistended.  Normal bowel sounds. . Musculoskeletal:  Unna boots present bilateral . Psychiatry: Appropriate mood with good judgment and insight.   Data Reviewed: CBC: Recent Labs  Lab 05/31/19 0743  WBC 6.6  HGB 10.7*  HCT 31.8*  MCV 88.6  PLT [id number]*   Basic Metabolic Panel: Recent Labs  Lab 05/30/19 0229 05/31/19 0743 06/01/19 0245 06/02/19 0318 06/05/19 0810  NA 137 136 138 138 131*  K 4.0 4.3 4.4 4.3 4.5  CL 105 101 105 104 95*  CO2 21* 21* 24 24 26   GLUCOSE 241* 285* 124* 252* 357*  BUN 9 14 14 13  25*  CREATININE 2.53* 2.25* 1.96* 1.59* 1.37*  CALCIUM 8.0* 8.4* 8.6* 8.2* 8.6*   GFR: Estimated Creatinine Clearance: 88.2 mL/min (A) (by C-G formula based on SCr of 1.37 mg/dL (H)). Liver Function Tests: No results for input(s): AST, ALT, ALKPHOS, BILITOT, PROT, ALBUMIN in the last 168 hours. No results for input(s): LIPASE, AMYLASE in the last 168 hours. No results for input(s): AMMONIA in the last 168 hours. Coagulation Profile: Recent Labs  Lab 05/30/19 0229  INR 1.2   Cardiac Enzymes: No results for input(s): CKTOTAL, CKMB, CKMBINDEX, TROPONINI in the last 168 hours. BNP (last 3 results) No results for input(s): PROBNP in the last 8760 hours. HbA1C: No results for input(s): HGBA1C in the last 72 hours. CBG: Recent Labs  Lab 06/04/19 1226 06/04/19 1707 06/04/19 2054 06/05/19 0648 06/05/19 1217  GLUCAP 212* 229* 257* 293* 233*   Lipid Profile: No results for input(s): CHOL, HDL, LDLCALC, TRIG, CHOLHDL, LDLDIRECT in the last 72 hours. Thyroid Function Tests: No results for input(s): TSH, T4TOTAL, FREET4, T3FREE, THYROIDAB in the last 72 hours. Anemia Panel: No results for input(s): VITAMINB12, FOLATE, FERRITIN, TIBC, IRON, RETICCTPCT in the last 72 hours. Urine analysis:    Component Value Date/Time   COLORURINE YELLOW 05/24/2019 0106   APPEARANCEUR HAZY (A) 05/24/2019 0106   LABSPEC >1.046 (H) 05/24/2019  0106   PHURINE 5.0 05/24/2019 0106   GLUCOSEU 150 (A) 05/24/2019 0106   HGBUR SMALL (A) 05/24/2019 0106   BILIRUBINUR NEGATIVE 05/24/2019 0106   KETONESUR 20 (A) 05/24/2019 0106   PROTEINUR 30 (A) 05/24/2019 0106   NITRITE NEGATIVE 05/24/2019 0106   LEUKOCYTESUR NEGATIVE 05/24/2019 0106   Sepsis Labs: @LABRCNTIP (procalcitonin:4,lacticidven:4)  ) Recent Results (from the past 240 hour(s))  MRSA PCR Screening     Status: None   Collection Time: 05/27/19  1:00 PM   Specimen: Nasal Mucosa; Nasopharyngeal  Result Value Ref Range Status   MRSA by PCR NEGATIVE NEGATIVE Final    Comment:        The GeneXpert MRSA Assay (FDA approved for NASAL specimens only), is one component of a comprehensive MRSA colonization surveillance program. It is not intended to diagnose MRSA infection nor to guide or monitor treatment for MRSA infections. Performed at [facility], [place] [address], [place], [place] [address]   Surgical pcr screen     Status: None   Collection Time: 05/30/19  1:34 AM   Specimen: Nasal Mucosa; Nasal Swab  Result Value Ref Range Status   MRSA, PCR NEGATIVE NEGATIVE  Final   Staphylococcus aureus NEGATIVE NEGATIVE Final    Comment: (NOTE) The Xpert SA Assay (FDA approved for NASAL specimens in patients 74 years of age and older), is one component of a comprehensive surveillance program. It is not intended to diagnose infection nor to guide or monitor treatment. Performed at [facility], [place] [address] [place], [place] [address]   Aerobic/Anaerobic Culture (surgical/deep wound)     Status: None   Collection Time: 05/30/19 10:19 AM   Specimen: PATH Soft tissue  Result Value Ref Range Status   Specimen Description TISSUE ABSCESS CHEST WALL  Final   Special Requests A  Final   Gram Stain   Final    RARE WBC PRESENT, PREDOMINANTLY PMN NO ORGANISMS SEEN    Culture   Final    RARE STAPHYLOCOCCUS AUREUS CRITICAL RESULT CALLED TO, READ BACK BY AND VERIFIED  WITH: RN S DEAVER [id number] AT 1432 BY CM NO ANAEROBES ISOLATED Performed at [facility], [place] [address] [place], [place] [address]    Report Status 06/04/2019 FINAL  Final   Organism ID, Bacteria STAPHYLOCOCCUS AUREUS  Final      Susceptibility   Staphylococcus aureus - MIC*    CIPROFLOXACIN <=0.5 SENSITIVE Sensitive     ERYTHROMYCIN 0.5 SENSITIVE Sensitive     GENTAMICIN <=0.5 SENSITIVE Sensitive     OXACILLIN 0.5 SENSITIVE Sensitive     TETRACYCLINE <=1 SENSITIVE Sensitive     VANCOMYCIN 1 SENSITIVE Sensitive     TRIMETH/SULFA <=10 SENSITIVE Sensitive     CLINDAMYCIN <=0.25 SENSITIVE Sensitive     RIFAMPIN <=0.5 SENSITIVE Sensitive     Inducible Clindamycin NEGATIVE Sensitive     * RARE STAPHYLOCOCCUS AUREUS  Aerobic/Anaerobic Culture (surgical/deep wound)     Status: None   Collection Time: 05/30/19 10:34 AM   Specimen: PATH Other; Body Fluid  Result Value Ref Range Status   Specimen Description ABSCESS PECTORALIS  Final   Special Requests B  Final   Gram Stain   Final    MODERATE WBC PRESENT, PREDOMINANTLY PMN FEW GRAM POSITIVE COCCI IN PAIRS    Culture   Final    MODERATE STAPHYLOCOCCUS AUREUS NO ANAEROBES ISOLATED Performed at [facility], [address], [place], [place] [address]    Report Status 06/04/2019 FINAL  Final   Organism ID, Bacteria STAPHYLOCOCCUS AUREUS  Final      Susceptibility   Staphylococcus aureus - MIC*    CIPROFLOXACIN <=0.5 SENSITIVE Sensitive     ERYTHROMYCIN <=0.25 SENSITIVE Sensitive     GENTAMICIN <=0.5 SENSITIVE Sensitive     OXACILLIN 0.5 SENSITIVE Sensitive     TETRACYCLINE <=1 SENSITIVE Sensitive     VANCOMYCIN 1 SENSITIVE Sensitive     TRIMETH/SULFA <=10 SENSITIVE Sensitive     CLINDAMYCIN <=0.25 SENSITIVE Sensitive     RIFAMPIN <=0.5 SENSITIVE Sensitive     Inducible Clindamycin NEGATIVE Sensitive     * MODERATE STAPHYLOCOCCUS AUREUS      Studies: No results found.  Scheduled Meds: . amLODipine  10 mg Oral  Daily  . Chlorhexidine Gluconate Cloth  6 each Topical Daily  . gabapentin  300 mg Oral BID  . heparin injection (subcutaneous)  5,000 Units Subcutaneous Q8H  . insulin aspart  0-5 Units Subcutaneous QHS  . insulin aspart  0-9 Units Subcutaneous TID WC  . insulin aspart  4 Units Subcutaneous TID WC  . insulin detemir  18 Units Subcutaneous Daily  . metoprolol tartrate  50  mg Oral BID  . multivitamin with minerals  1 tablet Oral Daily  . Ensure Max Protein  11 oz Oral Daily  . senna-docusate  2 tablet Oral BID  . sodium chloride flush  10-40 mL Intracatheter Q12H    Continuous Infusions: .  ceFAZolin (ANCEF) IV 2 g (06/05/19 0549)     LOS: 12 days     Truett Mainland, DO [facility] 06/05/2019, 1:17 PM

## 2019-06-05 NOTE — Plan of Care (Signed)

## 2019-06-06 ENCOUNTER — Ambulatory Visit: Payer: Self-pay | Admitting: Thoracic Surgery (Cardiothoracic Vascular Surgery)

## 2019-06-06 ENCOUNTER — Encounter (HOSPITAL_COMMUNITY): Payer: Self-pay | Admitting: *Deleted

## 2019-06-06 LAB — GLUCOSE, CAPILLARY
Glucose-Capillary: 294 mg/dL — ABNORMAL HIGH (ref 70–99)
Glucose-Capillary: 265 mg/dL — ABNORMAL HIGH (ref 70–99)
Glucose-Capillary: 114 mg/dL — ABNORMAL HIGH (ref 70–99)
Glucose-Capillary: 232 mg/dL — ABNORMAL HIGH (ref 70–99)

## 2019-06-06 MED ORDER — AMLODIPINE BESYLATE 10 MG PO TABS: 10.0000 mg | ORAL_TABLET | Freq: Every day | ORAL | 0 refills | Status: DC

## 2019-06-06 MED FILL — ?AMLODIPINE BESYLATE 10 MG: 10 | 30 days supply | Qty: 30 | Fill #0

## 2019-06-06 NOTE — Progress Notes (Signed)
Inpatient Diabetes Program Recommendations  AACE/ADA: New Consensus Statement on Inpatient Glycemic Control (2015)  Target Ranges:  Prepandial:   less than 140 mg/dL      Peak postprandial:   less than 180 mg/dL (1-2 hours)      Critically ill patients:  140 - 180 mg/dL   Lab Results  Component Value Date   GLUCAP 265 (H) 06/06/2019   HGBA1C 12.7 (H) 05/27/2019    Review of Glycemic Control Results for DADEN, MONJARAZ (MRN [id number]) as of 06/06/2019 14:33  Ref. Range 06/05/2019 17:07 06/05/2019 20:48 06/06/2019 06:44 06/06/2019 13:03  Glucose-Capillary Latest Ref Range: 70 - 99 mg/dL 173 (H) 240 (H) 294 (H) 265 (H)   Diabetes history: DM 2 Outpatient Diabetes medications: Levemir 20 units daily, Humalog 12 units daily with lunch Current orders for Inpatient glycemic control:  Novolog sensitive tid with meals and HS Novolog 4 units tid with meals Levemir 25 units daily  Inpatient Diabetes Program Recommendations:    Discussed with patient his current A1C and glycemic control.  Discussed normal/goal blood sugars of 100-150 mg/dL.  Patient has had low blood sugars in the past that included seizure.  We discussed importance of avoidance of low blood sugars including eating regularly, recognizing symptoms of low blood sugars, treating with 15 grams of CHO (1/2 cup of juice or regular soda) and making sure that family/friends know signs, symptoms and treatment of low blood sugar.  Patient verbalized understanding.  Explained that prevention of hypoglycemia is important. Patient reports being hungry often and asked about the Ensure Max.  Explained that his body needs protein for healing as well and that Ensure max was ordered by MD/nutritionist for this.  Patient plans to f/u at [facility] for DM and plans to get medications there as well.  He states that he tries to snack on healthy snacks such as carrots and cucumbers however he admits that sometimes he does not have enough healthy foods due to cost.   We reviewed Levemir and Novolog doses for discharge as well.  He states that he needs more strips as well. Encouraged him to write down his blood sugar values and f/u closely with outpatient MD so that adjustments can be made.  Patient appreciative of information.   Thanks  Adah Perl, RN, BC-ADM Inpatient Diabetes Coordinator Pager [phone]

## 2019-06-06 NOTE — Progress Notes (Signed)
     [address]       [place] [address]             [phone]       No events Vitals:   06/06/19 0417 06/06/19 0812  BP: (!) 151/95 (!) 152/96  Pulse: 81 92  Resp:    Temp: 98.3 F (36.8 C) 99.1 F (37.3 C)  SpO2: 98% 100%    Alert NAD RRR Wound vac to suction  Wound vac changed at the bedside Granulation tissue starting to form Wound 12cm long, 5cm wide, 3cm deep  Next wound vac change in 1 week.  Can follow-up in clinic  [place]

## 2019-06-06 NOTE — Progress Notes (Signed)
Physical Therapy Treatment Patient Details Name: Taylor Hughes MRN: [id number] DOB: 10-07-1970 Today's Date: 06/06/2019    History of Present Illness Pt is a pleasant 48 yo male presenting s/p I&D and placement of wound VAC of left pec/chest due to ongoing infefction. 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    PT Comments    Pt was awake and ambulating independently in room upon arrival of PT. The patient was able to demo significant improvements in stability and endurance with ambulation today, as he was able to amb ~250 ft around unit with use of quad cane and supervision. The patient also was able to navigate 3 steps x3 with use of rail and quad cane and then only a quad cane and had no LOB or safety concerns. Despite improvements, the patient ambulates with a gait speed of 0.37m/s, which indicates increased risk of falls, and therefore the patient will continue to benefit from skilled PT to address limitations in functional endurance and mobility. Recommend OPPT as pt reports he has access to transportation, and they can address limitations in endurance and dynamic balance that persist.     Follow Up Recommendations  Outpatient PT     Equipment Recommendations  Cane(Quad cane)    Recommendations for Other Services       Precautions / Restrictions Precautions Precautions: Fall Restrictions Weight Bearing Restrictions: No    Mobility  Bed Mobility Overal bed mobility: Independent             General bed mobility comments: Pt able to complete all bed mobility independently, assist only for wound vac management  Transfers Overall transfer level: Modified independent Equipment used: Quad cane Transfers: Sit to/from Stand Sit to Stand: Modified independent (Device/Increase time)         General transfer comment: slow to rise, no assist  Ambulation/Gait Ambulation/Gait assistance: Modified independent  (Device/Increase time) Gait Distance (Feet): 250 Feet Assistive device: Quad cane Gait Pattern/deviations: Step-through pattern;Wide base of support Gait velocity: 0.48m/s Gait velocity interpretation: 1.31 - 2.62 ft/sec, indicative of limited community ambulator General Gait Details: Pt ambulates with steady, step-through gait pattern that is slow, but pt reports it is similar to his baseline. Increased sway with continued amb, BORG RPE of 5/10   Stairs Stairs: Yes Stairs assistance: Supervision Stair Management: With cane Number of Stairs: 3 General stair comments: Completed 3 stairs x3, once with rail and quad cane, twice with quad cane and no rail. Pt educated on technique and was able to complete without assist   Wheelchair Mobility    Modified Rankin (Stroke Patients Only)       Balance Overall balance assessment: Needs assistance Sitting-balance support: No upper extremity supported;Feet supported Sitting balance-Leahy Scale: Good     Standing balance support: No upper extremity supported Standing balance-Leahy Scale: Fair Standing balance comment: able to ambulate and stand at sink without UE support                            Cognition Arousal/Alertness: Awake/alert Behavior During Therapy: WFL for tasks assessed/performed Overall Cognitive Status: Within Functional Limits for tasks assessed                                        Exercises      General Comments  Pertinent Vitals/Pain Pain Assessment: No/denies pain Pain Intervention(s): Repositioned;Monitored during session    Home Living                      Prior Function            PT Goals (current goals can now be found in the care plan section) Acute Rehab PT Goals Patient Stated Goal: return home PT Goal Formulation: With patient Time For Goal Achievement: 06/15/19 Potential to Achieve Goals: Good Progress towards PT goals: Progressing toward  goals    Frequency    Min 2X/week      PT Plan Current plan remains appropriate    Co-evaluation              AM-PAC PT "6 Clicks" Mobility   Outcome Measure  Help needed turning from your back to your side while in a flat bed without using bedrails?: None Help needed moving from lying on your back to sitting on the side of a flat bed without using bedrails?: None Help needed moving to and from a bed to a chair (including a wheelchair)?: None Help needed standing up from a chair using your arms (e.g., wheelchair or bedside chair)?: None Help needed to walk in hospital room?: None Help needed climbing 3-5 steps with a railing? : A Little 6 Click Score: 23    End of Session Equipment Utilized During Treatment: Gait belt Activity Tolerance: Patient tolerated treatment well;No increased pain Patient left: in bed;with call bell/phone within reach Nurse Communication: Mobility status PT Visit Diagnosis: Unsteadiness on feet (R26.81);Difficulty in walking, not elsewhere classified (R26.2)     Time: [id number] PT Time Calculation (min) (ACUTE ONLY): 29 min  Charges:  $Gait Training: 23-37 mins                     Mickey Farber, PT, DPT   Acute Rehabilitation Department [phone]   Otho Bellows 06/06/2019, 9:25 AM

## 2019-06-06 NOTE — Discharge Summary (Signed)
Discharge Summary  Taylor Hughes VHQ:[id number] DOB: Sep 23, 1970  PCP: Charlott Rakes, MD  Admit date: 05/23/2019 Discharge date: 06/06/2019  Time spent: 35 minutes   Recommendations for Outpatient Follow-up:  1. Follow up with CTS 2. Follow up with your PCP 3. Take your medications as prescribed    Discharge Diagnoses:  Active Hospital Problems   Diagnosis Date Noted   Sepsis ([place]) 05/24/2019   Type 2 diabetes mellitus with other specified complication ([place]) 41/32/4401   Essential hypertension 11/23/2018    Resolved Hospital Problems  No resolved problems to display.    Discharge Condition: Stable   Diet recommendation: Resume previous diet   Vitals:   06/06/19 0417 06/06/19 0812  BP: (!) 151/95 (!) 152/96  Pulse: 81 92  Resp:    Temp: 98.3 F (36.8 C) 99.1 F (37.3 C)  SpO2: 98% 100%    History of present illness:  48 yo male whopresented with fever and left anterior chest wall pain. 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 Patient reports fevers and chest pain for about 2 days. No history of local trauma. On his initial physical examination he was febrile 100.5 F.Tenderness in the left anterior chest wall with positive local induration. 2 opening wounds in the mid upper anterior thorax.  Patient was admitted to the hospital working diagnosis of sepsis due to left pectoralis muscle cellulitis/abscess.  Was started empirically on Vancomycin and Zosyn.   Transferred to [facility] from [facility] for CTS evaluation and possible debridement.  Post left chest wall abscess I&D with placement of ACell matrix and wound VAC on 05/30/19 by Dr. Kipp Brood.  06/01/19, ID consulted to assist with antibiotics for disposition.  Polymicrobial deep tissue infection with group A strep and MSSA to left chest wall extending down to the joint.  Hospital  course complicated by uncontrolled hypertension for which Norvasc 5 mg daily was added.  Type 2 diabetes complicated by hyperglycemia and hypoglycemia requiring adjustment of insulin coverage.    06/05/19: Patient doing well.  No acute events overnight.  Planned wound vac dressing change by CTS, Dr. Kipp Brood on Monday, 06/06/2019.  Case manager assisting with disposition to home with home health services.  06/06/19: Patient seen and examined at his bedside this morning. No acute events overnight. No new complaints.   Hospital Course:  Active Problems:   Essential hypertension   Type 2 diabetes mellitus with other specified complication (HCC)   Sepsis ([place])  POD #7 post left chest wall abscess I&D with placement of ACell matrix and wound VAC on 05/30/19 by Dr. Kipp Brood. Deep wound culture 05/30/2019, abscess pectoralis grew staphylococcus pansensitive. Continue IV antibiotics as recommended by ID. On IV ancef. Abscess wound culture obtained on 05/24/19.  Grew staph aureus and group A strep pansensitive.   PICC line placement on 06/02/2019 Planned wound vac dressing change on Monday, 06/06/19 Will continue infusions at home Case manager assisting with disposition/home health services.  Chronic bilateral lower extremity edema Unna boots placed on 06/03/19 Discussed case with Dr. Meda Coffee who reviewed his last TTE done in 2019. No heart failure diagnosis. Recommended weight loss, low sodium diet. Last 2D echo on 07/30/2018 showed LVEF 65 to 70% Follow up with PCP  Suspected undiagnosed OSA Based on body habitus self-reported witnessed snoring Recommend polysomnography, will need pulmonology referral from PCP  Polymicrobial deep tissue infection with Group A strep and MSSA to left chest wall extended  down to the joint Infectious disease recommends 6 weeks of IV cefazolin, starting date date 05/30/2019 PICC line in place OPAT per pharmacy  Resolving Sepsis secondary to left  pectoralis muscle cellulitis/abscess  Presented with fever, tachycardia and tachypnea CT chest with contrast done on 05/31/2019 showed: There is a 5.6 x 2.8 cm ill-defined low attenuating collection in the left pectoralis major which may represent an old hematoma or infected fluid. This appears to extend more superiorly and medially to the left sternoclavicular joint. There is irregularity and fragmentation of the left sternoclavicular joint which may represent an infectious process. Initially was seen by general surgery, signed off on 05/26/2019 Transferred to [facility] on 05/26/2019 for debridement by CTS. Completed broad-spectrum antibiotics IV vancomycin and IV Zosyn empirically. Blood cultures x2 drawn peripherally taken on 05/23/2019 no growth final. Aerobic culture taken from superficial specimen, wound abscess taken on 05/24/2019: Rare Staphylococcus aureus and rare group A strep.  Pansensitive. MRSA screening negative. Post I&D and surgical debridement on Monday, 05/30/2019 by CTS.  Microbiology data as stated above Sepsis criteria is resolving Follow up with CTS  Polyneuropathy Continue gabapentin 300 mg BID  Improving AKI, suspect prerenal secondary to poor oral intake Baseline creatinine appears to be 0.9 with GFR greater than 60 Creatinine 2.42 on 05/29/2019 with GFR of 36 Creatinine 2.53 with GFR of 34 on 05/30/2019 Creatinine 2.2 with GFR of 39 on 05/31/2019 Cr 1.96 on 06/01/19 with GFR46  Creatinine 1.59 on 06/02/2019 with GFR 59 Cr 1.37 with GFR >60 on 06/05/19 Continue to avoid nephrotoxins such as NSAIDs Avoid dehydration Follow up with your PCP  Health screening HIV screening negative 2019  Resolved Non anion gap metabolic acidosis likely secondary to acute renal failure Chemistry bicarb 21 and anion gap of 11 on 05/30/2019 Chemistry bicarb 24 and AG 9 on 06/01/19 Chemistry bicarb 24 and anion gap of 10 on 06/02/2019. Completed p.o. bicarb replacement    Resolved Transient hypoglycemia likely secondary to poor oral intake Decreased dose of insulin coverage  Insulin-dependent type 2 diabetes with hyperglycemia Hemoglobin A1c 11.4 on 11/25/2018 Hemoglobin A1c 12.7 on 05/27/2019. Continue insulin coverage Follow up with your PCP  Mild acute blood loss anemia/chronic normocytic anemia Hemoglobin stable 10.7 on 05/31/19 from 10.0 on 05/29/2019 Hemoglobin appears to be at his baseline 10.4 with MCV of 91 on 05/28/2019 No sign of overt bleeding  Resolved Suspected situational anxiety No prior history of chronic anxiety or psych illness  Resolved intractable nausea and vomiting, unclear etiology  Obesity BMI 36 Recommend weight loss outpatient with regular physical activity and healthy dieting  Uncontrolled hypertension Blood pressure is stable Continue on Lopressor 50 mg twice daily and Norvasc 5 mg daily. Continue Norvasc to 10 mg daily Follow up with PCP  Code Status:full   Consultants:  Surgery   CT surgery  ID  Procedures:  I&D.  Antimicrobials:  Ancef    Discharge Exam: BP (!) 152/96 (BP Location: Left Arm)    Pulse 92    Temp 99.1 F (37.3 C) (Oral)    Resp 18    Ht _0  (1.803 m)    Wt 120.7 kg    SpO2 100%    BMI 37.11 kg/m   General: 48 y.o. year-old male well developed well nourished in no acute distress.  Alert and oriented x4.  Cardiovascular: Regular rate and rhythm with no rubs or gallops.  No thyromegaly or JVD noted.    Respiratory: Clear to auscultation with no wheezes or rales. Good  inspiratory effort.  Abdomen: Soft nontender nondistended with normal bowel sounds x4 quadrants.  Musculoskeletal: No lower extremity edema. 2/4 pulses in all 4 extremities. Left upper chest wound in wound vac. No drainage noted at the time of this visit.,  Psychiatry: Mood is appropriate for condition and setting  Discharge Instructions You were cared for by a hospitalist during your  hospital stay. If you have any questions about your discharge medications or the care you received while you were in the hospital after you are discharged, you can call the unit and asked to speak with the hospitalist on call if the hospitalist that took care of you is not available. Once you are discharged, your primary care physician will handle any further medical issues. Please note that NO REFILLS for any discharge medications will be authorized once you are discharged, as it is imperative that you return to your primary care physician (or establish a relationship with a primary care physician if you do not have one) for your aftercare needs so that they can reassess your need for medications and monitor your lab values.  Discharge Instructions    Home infusion instructions [facility] May follow [place] Dosing Protocol; May administer Cathflo as needed to maintain patency of vascular access device.; Flushing of vascular access device: per [facility] Protocol: 0.9% NaCl pre/post medica...   Complete by: As directed    Instructions: May follow [place] Dosing Protocol   Instructions: May administer Cathflo as needed to maintain patency of vascular access device.   Instructions: Flushing of vascular access device: per [facility] Protocol: 0.9% NaCl pre/post medication administration and prn patency; Heparin 100 u/ml, 58m for implanted ports and Heparin 10u/ml, 567mfor all other central venous catheters.   Instructions: May follow [facility] Anaphylaxis Protocol for First Dose Administration in the home: 0.9% NaCl at 25-50 ml/hr to maintain IV access for protocol meds. Epinephrine 0.3 ml IV/IM PRN and Benadryl 25-50 IV/IM PRN s/s of anaphylaxis.   Instructions: AdEdinburgnfusion Coordinator (RN) to assist per patient IV care needs in the home PRN.     Allergies as of 06/06/2019   No Known Allergies     Medication List    STOP taking these medications   aspirin EC 81 MG tablet     cyclobenzaprine 10 MG tablet Commonly known as: FLEXERIL   ibuprofen 200 MG tablet Commonly known as: ADVIL   insulin lispro 100 UNIT/ML injection Commonly known as: HUMALOG     TAKE these medications   amLODipine 10 MG tablet Commonly known as: NORVASC Take 1 tablet (10 mg total) by mouth daily. Start taking on: June 07, 2019   blood glucose meter kit and supplies Kit Dispense based on patient and insurance preference. Use up to four times daily as directed. (FOR ICD-9 250.00, 250.01).   ceFAZolin  IVPB Commonly known as: ANCEF Inject 2 g into the vein every 8 (eight) hours. Indication:  Osteomyelitis/joint infection Last Day of Therapy:  07/11/2019 Labs - Once weekly:  CBC/D and BMP, Labs - Every other week:  ESR and CRP   gabapentin 300 MG capsule Commonly known as: NEURONTIN Take 1 capsule (300 mg total) by mouth 2 (two) times daily.   glucose blood test strip Needs supply of TRUEMETRIC STRIPS Use as instructed   HYDROcodone-acetaminophen 5-325 MG tablet Commonly known as: NORCO/VICODIN Take 1 tablet by mouth daily as needed for severe pain. What changed:   how much to take  when to take this  reasons to take this   insulin aspart 100 UNIT/ML FlexPen Commonly known as: NOVOLOG Inject 6 Units into the skin 3 (three) times daily with meals.   Insulin Detemir 100 UNIT/ML Pen Commonly known as: LEVEMIR Inject 18 Units into the skin daily. What changed: how much to take   Insulin Pen Needle 32G X 5 MM Misc Use with levemir pen as prescribed   metoprolol tartrate 50 MG tablet Commonly known as: LOPRESSOR Take 1 tablet (50 mg total) by mouth 2 (two) times daily.   multivitamin with minerals Tabs tablet Take 1 tablet by mouth daily.   TRUEplus Lancets 28G Misc Use to check blood sugar daily.            Home Infusion Instuctions  (From admission, onward)         Start     Ordered   06/03/19 0000  Home infusion instructions [facility] May follow [place] Dosing Protocol; May administer Cathflo as needed to maintain patency of vascular access device.; Flushing of vascular access device: per [facility] Protocol: 0.9% NaCl pre/post medica...    Question Answer Comment  Instructions May follow [place] Dosing Protocol   Instructions May administer Cathflo as needed to maintain patency of vascular access device.   Instructions Flushing of vascular access device: per [facility] Protocol: 0.9% NaCl pre/post medication administration and prn patency; Heparin 100 u/ml, 55m for implanted ports and Heparin 10u/ml, 583mfor all other central venous catheters.   Instructions May follow [facility] Anaphylaxis Protocol for First Dose Administration in the home: 0.9% NaCl at 25-50 ml/hr to maintain IV access for protocol meds. Epinephrine 0.3 ml IV/IM PRN and Benadryl 25-50 IV/IM PRN s/s of anaphylaxis.   Instructions [facility] Infusion Coordinator (RN) to assist per patient IV care needs in the home PRN.      06/03/19 1700           Durable Medical Equipment  (From admission, onward)         Start     Ordered   06/02/19 0657  For home use only DME Tub bench  Once     06/02/19 0656   06/02/19 0657  For home use only DME 3 n 1  Once     06/02/19 0656         No Known Allergies Follow-up Information    LiLajuana MatteMD Follow up.   Specialty: Cardiothoracic Surgery Why: Monday Oct 23 [facility] 1:15 [facility] Contact information: [address] [place] [phone]      NeCharlott RakesMD Follow up in 1 day(s).   Specialty: Family Medicine Why: please call for a post hospital follow up appointment. Contact information: [place] [phone]          The results of significant diagnostics from this hospitalization (including imaging, microbiology, ancillary and laboratory) are listed below for reference.    Significant Diagnostic  Studies: Ct Chest W Contrast  Result Date: 05/23/2019 CLINICAL DATA:  [age]ear old male with concern for chest wall infection. EXAM: CT CHEST WITH CONTRAST TECHNIQUE: Multidetector CT imaging of the chest was performed during intravenous contrast administration. CONTRAST:  7528mMNIPAQUE IOHEXOL 300 MG/ML  SOLN COMPARISON:  Chest CT dated 12/17/2018 FINDINGS: Cardiovascular: There is no cardiomegaly or pericardial effusion. Coronary vascular calcifications noted. The thoracic aorta appears unremarkable for the degree opacification. Evaluation of the pulmonary arteries is  very limited due to suboptimal opacification and timing contrast. Mediastinum/Nodes: No hilar or mediastinal adenopathy. Esophagus and thyroid gland are grossly unremarkable. No mediastinal fluid collection. Lungs/Pleura: The lungs are clear. There is no pleural effusion pneumothorax. The central airways are patent. Upper Abdomen: No acute abnormality. Musculoskeletal: There is a 5.6 x 2.8 cm ill-defined low attenuating collection in the left pectoralis major which may represent an old hematoma or infected fluid. This appears to extend more superiorly and medially to the left sternoclavicular joint. There is irregularity and fragmentation of the left sternoclavicular joint which may represent an infectious process. Ill-defined low attenuating tubular area in the midline upper chest and suprasternal notch (series 2, image 16) extends to the skin surface and is concerning for fluid. Ultrasound may provide better evaluation of the fluid collection in the left pectoral and anterior chest wall. Old right posterior rib fractures and multilevel degenerative changes of the spine. No acute osseous pathology. IMPRESSION: 1. Ill-defined low attenuating collection in the left pectoralis major which may represent an old hematoma or infected fluid. This collection appears to extend more superiorly and medially to the left sternoclavicular joint. There is  irregularity and fragmentation of the left sternoclavicular joint which may represent an infectious process. Ill-defined low attenuating area in the soft tissues of the midline upper chest and suprasternal notch concerning for fluid. Ultrasound may provide better evaluation of the fluid collection in the left pectoral and anterior chest wall. 2. No acute intrathoracic pathology. Electronically Signed   By: Anner Crete M.D.   On: 05/23/2019 23:14   [place] Ekg Site Rite  Result Date: 06/01/2019 If Site Rite image not attached, placement could not be confirmed due to current cardiac rhythm.   Microbiology: Recent Results (from the past 240 hour(s))  MRSA PCR Screening     Status: None   Collection Time: 05/27/19  1:00 PM   Specimen: Nasal Mucosa; Nasopharyngeal  Result Value Ref Range Status   MRSA by PCR NEGATIVE NEGATIVE Final    Comment:        The GeneXpert MRSA Assay (FDA approved for NASAL specimens only), is one component of a comprehensive MRSA colonization surveillance program. It is not intended to diagnose MRSA infection nor to guide or monitor treatment for MRSA infections. Performed at [facility], [place] [address], [place], [place] [address]   Surgical pcr screen     Status: None   Collection Time: 05/30/19  1:34 AM   Specimen: Nasal Mucosa; Nasal Swab  Result Value Ref Range Status   MRSA, PCR NEGATIVE NEGATIVE Final   Staphylococcus aureus NEGATIVE NEGATIVE Final    Comment: (NOTE) The Xpert SA Assay (FDA approved for NASAL specimens in patients 33 years of age and older), is one component of a comprehensive surveillance program. It is not intended to diagnose infection nor to guide or monitor treatment. Performed at [facility], [place] [address], [place], [place] [address]   Aerobic/Anaerobic Culture (surgical/deep wound)     Status: None   Collection Time: 05/30/19 10:19 AM   Specimen: PATH Soft tissue  Result Value Ref Range Status   Specimen  Description TISSUE ABSCESS CHEST WALL  Final   Special Requests A  Final   Gram Stain   Final    RARE WBC PRESENT, PREDOMINANTLY PMN NO ORGANISMS SEEN    Culture   Final    RARE STAPHYLOCOCCUS AUREUS CRITICAL RESULT CALLED TO, READ BACK BY AND VERIFIED WITH: RN Milana Obey [phone] AT  1432 BY CM NO ANAEROBES ISOLATED Performed at [facility], [place] [address], [place], [place] [address]    Report Status 06/04/2019 FINAL  Final   Organism ID, Bacteria STAPHYLOCOCCUS AUREUS  Final      Susceptibility   Staphylococcus aureus - MIC*    CIPROFLOXACIN <=0.5 SENSITIVE Sensitive     ERYTHROMYCIN 0.5 SENSITIVE Sensitive     GENTAMICIN <=0.5 SENSITIVE Sensitive     OXACILLIN 0.5 SENSITIVE Sensitive     TETRACYCLINE <=1 SENSITIVE Sensitive     VANCOMYCIN 1 SENSITIVE Sensitive     TRIMETH/SULFA <=10 SENSITIVE Sensitive     CLINDAMYCIN <=0.25 SENSITIVE Sensitive     RIFAMPIN <=0.5 SENSITIVE Sensitive     Inducible Clindamycin NEGATIVE Sensitive     * RARE STAPHYLOCOCCUS AUREUS  Aerobic/Anaerobic Culture (surgical/deep wound)     Status: None   Collection Time: 05/30/19 10:34 AM   Specimen: PATH Other; Body Fluid  Result Value Ref Range Status   Specimen Description ABSCESS PECTORALIS  Final   Special Requests B  Final   Gram Stain   Final    MODERATE WBC PRESENT, PREDOMINANTLY PMN FEW GRAM POSITIVE COCCI IN PAIRS    Culture   Final    MODERATE STAPHYLOCOCCUS AUREUS NO ANAEROBES ISOLATED Performed at [facility], [address] [place], [place] [address]    Report Status 06/04/2019 FINAL  Final   Organism ID, Bacteria STAPHYLOCOCCUS AUREUS  Final      Susceptibility   Staphylococcus aureus - MIC*    CIPROFLOXACIN <=0.5 SENSITIVE Sensitive     ERYTHROMYCIN <=0.25 SENSITIVE Sensitive     GENTAMICIN <=0.5 SENSITIVE Sensitive     OXACILLIN 0.5 SENSITIVE Sensitive     TETRACYCLINE <=1 SENSITIVE Sensitive     VANCOMYCIN 1 SENSITIVE Sensitive     TRIMETH/SULFA <=10 SENSITIVE  Sensitive     CLINDAMYCIN <=0.25 SENSITIVE Sensitive     RIFAMPIN <=0.5 SENSITIVE Sensitive     Inducible Clindamycin NEGATIVE Sensitive     * MODERATE STAPHYLOCOCCUS AUREUS     Labs: Basic Metabolic Panel: Recent Labs  Lab 05/31/19 0743 06/01/19 0245 06/02/19 0318 06/05/19 0810  NA 136 138 138 131*  K 4.3 4.4 4.3 4.5  CL 101 105 104 95*  CO2 21* _0 GLUCOSE 285* 124* 252* 357*  BUN _1 25*  CREATININE 2.25* 1.96* 1.59* 1.37*  CALCIUM 8.4* 8.6* 8.2* 8.6*   Liver Function Tests: No results for input(s): AST, ALT, ALKPHOS, BILITOT, PROT, ALBUMIN in the last 168 hours. No results for input(s): LIPASE, AMYLASE in the last 168 hours. No results for input(s): AMMONIA in the last 168 hours. CBC: Recent Labs  Lab 05/31/19 0743  WBC 6.6  HGB 10.7*  HCT 31.8*  MCV 88.6  PLT 409*   Cardiac Enzymes: No results for input(s): CKTOTAL, CKMB, CKMBINDEX, TROPONINI in the last 168 hours. BNP: BNP (last 3 results) Recent Labs    06/03/19 0944  BNP 351.4*    ProBNP (last 3 results) No results for input(s): PROBNP in the last 8760 hours.  CBG: Recent Labs  Lab 06/05/19 0648 06/05/19 1217 06/05/19 1707 06/05/19 2048 06/06/19 0644  GLUCAP 293* 233* 173* 240* 294*       Signed:  Kayleen Memos, MD [facility] 06/06/2019, 10:52 AM

## 2019-06-07 LAB — GLUCOSE, CAPILLARY
Glucose-Capillary: 265 mg/dL — ABNORMAL HIGH (ref 70–99)
Glucose-Capillary: 245 mg/dL — ABNORMAL HIGH (ref 70–99)

## 2019-06-07 MED ORDER — HYDROCODONE-ACETAMINOPHEN 5-325 MG PO TABS: 1.0000 | ORAL_TABLET | Freq: Every day | ORAL | 0 refills | Status: DC | PRN

## 2019-06-07 MED ORDER — BLOOD GLUCOSE MONITOR KIT: PACK | 0 refills | Status: DC

## 2019-06-07 MED ORDER — INSULIN DETEMIR 100 UNIT/ML FLEXPEN: 20.0000 [IU] | PEN_INJECTOR | Freq: Every day | SUBCUTANEOUS | 0 refills | Status: DC

## 2019-06-07 MED ORDER — GABAPENTIN 300 MG PO CAPS: 300.0000 mg | ORAL_CAPSULE | Freq: Two times a day (BID) | ORAL | 0 refills | Status: DC

## 2019-06-07 MED ORDER — METOPROLOL TARTRATE 50 MG PO TABS: 50.0000 mg | ORAL_TABLET | Freq: Two times a day (BID) | ORAL | 0 refills | Status: DC

## 2019-06-07 MED ORDER — ADULT MULTIVITAMIN W/MINERALS CH: 1.0000 | ORAL_TABLET | Freq: Every day | ORAL | 0 refills | Status: DC

## 2019-06-07 MED ORDER — HEPARIN SOD (PORK) LOCK FLUSH 100 UNIT/ML IV SOLN
250.0000 [IU] | INTRAVENOUS | Status: AC | PRN
  Administered 2019-06-07: 15:00:00 250 [IU]
  Filled 2019-06-07: qty 2.5

## 2019-06-07 MED ORDER — INSULIN ASPART 100 UNIT/ML FLEXPEN: 7.0000 [IU] | PEN_INJECTOR | Freq: Three times a day (TID) | SUBCUTANEOUS | 0 refills | Status: DC

## 2019-06-07 MED ORDER — CEFAZOLIN IV (FOR PTA / DISCHARGE USE ONLY): 2.0000 g | Freq: Three times a day (TID) | INTRAVENOUS | 0 refills | Status: AC

## 2019-06-07 MED ORDER — AMLODIPINE BESYLATE 10 MG PO TABS: 10.0000 mg | ORAL_TABLET | Freq: Every day | ORAL | 0 refills | Status: DC

## 2019-06-07 MED FILL — GABAPENTIN 300 MG CAPSULE: 300 | 30 days supply | Qty: 60 | Fill #0

## 2019-06-07 MED FILL — ?METOPROLOL 50 MG TABLET: 50 | 30 days supply | Qty: 60 | Fill #0

## 2019-06-07 NOTE — Discharge Summary (Signed)
Discharge Summary  Taylor Hughes QMG:[id number] DOB: 1971-06-13  PCP: Charlott Rakes, MD  Admit date: 05/23/2019 Discharge date: 06/07/2019  Time spent: 35 minutes   Recommendations for Outpatient Follow-up:  1. Follow up with CTS 2. Follow up with your PCP 3. Follow-up with infectious disease 4. Take your medications as prescribed 5. Continue IV antibiotics infusion as recommended by infectious disease.    Discharge Diagnoses:  Active Hospital Problems   Diagnosis Date Noted   Sepsis ([place]) 05/24/2019   Type 2 diabetes mellitus with other specified complication ([place]) 89/16/9450   Essential hypertension 11/23/2018    Resolved Hospital Problems  No resolved problems to display.    Discharge Condition: Stable   Diet recommendation: Resume previous diet   Vitals:   06/07/19 0427 06/07/19 0752  BP: 132/85 (!) 146/91  Pulse: 85 87  Resp: 14 17  Temp: 98.6 F (37 C) 98.6 F (37 C)  SpO2: 99% 100%    History of present illness:  48 yo male whopresented with fever and left anterior chest wall pain. 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 Patient reports fevers and chest pain for about 2 days. No history of local trauma. On his initial physical examination he was febrile 100.5 F.Tenderness in the left anterior chest wall with positive local induration. 2 opening wounds in the mid upper anterior thorax.  Patient was admitted to the hospital working diagnosis of sepsis due to left pectoralis muscle cellulitis/abscess.  Was started empirically on Vancomycin and Zosyn.   Transferred to [facility] from [facility] for CTS evaluation and possible debridement.  Post left chest wall abscess I&D with placement of ACell matrix and wound VAC on 05/30/19 by Dr. Kipp Brood.  06/01/19, ID consulted to assist with antibiotics for disposition.  Polymicrobial deep  tissue infection with group A strep and MSSA to left chest wall extending down to the joint.  Hospital course complicated by uncontrolled hypertension for which Norvasc 5 mg daily was added.  Type 2 diabetes complicated by hyperglycemia and hypoglycemia requiring adjustment of insulin coverage.    06/05/19: Patient doing well.  No acute events overnight.  Planned wound vac dressing change by CTS, Dr. Kipp Brood on Monday, 06/06/2019.  Case manager assisting with disposition to home with home health services.  06/06/19: Patient seen and examined at his bedside this morning. No acute events overnight. No new complaints.   Hospital Course:  Active Problems:   Essential hypertension   Type 2 diabetes mellitus with other specified complication (HCC)   Sepsis ([place])  POD #8 post left chest wall abscess I&D with placement of ACell matrix and wound VAC on 05/30/19 by Dr. Kipp Brood. Deep wound culture 05/30/2019, abscess pectoralis grew staphylococcus pansensitive. Continue IV antibiotics as recommended by ID. IV ancef from 05/30/19 through 07/11/19 for total of 6 weeks. Abscess wound culture obtained on 05/24/19.  Grew staph aureus and group A strep pansensitive.   PICC line placement on 06/02/2019 Planned wound vac dressing change on Monday, 06/06/19 Will continue infusions at home Case manager assisting with disposition/home health services.  Chronic bilateral lower extremity edema Unna boots placed on 06/03/19>> 10/26-27/20 Discussed case with Dr. Meda Coffee who reviewed his last TTE done in 2019. No heart failure diagnosis. Recommended weight loss, low sodium diet. Last 2D echo on 07/30/2018 showed LVEF 65 to 70% Follow up with PCP  Suspected undiagnosed OSA Based on body habitus self-reported witnessed snoring Recommend  polysomnography, will need pulmonology referral from PCP  Polymicrobial deep tissue infection with Group A strep and MSSA to left chest wall extended down to the  joint Infectious disease recommends 6 weeks of IV cefazolin, starting date date 05/30/2019 PICC line in place OPAT per pharmacy  Resolving Sepsis secondary to left pectoralis muscle cellulitis/abscess  Presented with fever, tachycardia and tachypnea CT chest with contrast done on 05/31/2019 showed: There is a 5.6 x 2.8 cm ill-defined low attenuating collection in the left pectoralis major which may represent an old hematoma or infected fluid. This appears to extend more superiorly and medially to the left sternoclavicular joint. There is irregularity and fragmentation of the left sternoclavicular joint which may represent an infectious process. Initially was seen by general surgery, signed off on 05/26/2019 Transferred to [facility] on 05/26/2019 for debridement by CTS. Completed broad-spectrum antibiotics IV vancomycin and IV Zosyn empirically. Blood cultures x2 drawn peripherally taken on 05/23/2019 no growth final. Aerobic culture taken from superficial specimen, wound abscess taken on 05/24/2019: Rare Staphylococcus aureus and rare group A strep.  Pansensitive. MRSA screening negative. Post I&D and surgical debridement on Monday, 05/30/2019 by CTS.  Microbiology data as stated above Sepsis criteria is resolving Follow up with CTS  Polyneuropathy Continue gabapentin 300 mg BID  Improving AKI, suspect prerenal secondary to poor oral intake Baseline creatinine appears to be 0.9 with GFR greater than 60 Creatinine 2.42 on 05/29/2019 with GFR of 36 Creatinine 2.53 with GFR of 34 on 05/30/2019 Creatinine 2.2 with GFR of 39 on 05/31/2019 Cr 1.96 on 06/01/19 with GFR46  Creatinine 1.59 on 06/02/2019 with GFR 59 Cr 1.37 with GFR >60 on 06/05/19 Continue to avoid nephrotoxins such as NSAIDs Avoid dehydration Follow up with your PCP  Health screening HIV screening negative 2019  Resolved Non anion gap metabolic acidosis likely secondary to acute renal failure Chemistry bicarb 21 and  anion gap of 11 on 05/30/2019 Chemistry bicarb 24 and AG 9 on 06/01/19 Chemistry bicarb 24 and anion gap of 10 on 06/02/2019. Completed p.o. bicarb replacement   Resolved Transient hypoglycemia likely secondary to poor oral intake Decreased dose of insulin coverage  Insulin-dependent type 2 diabetes with hyperglycemia Hemoglobin A1c 11.4 on 11/25/2018 Hemoglobin A1c 12.7 on 05/27/2019. Continue insulin coverage Follow up with your PCP  Mild acute blood loss anemia/chronic normocytic anemia Hemoglobin stable 10.7 on 05/31/19 from 10.0 on 05/29/2019 Hemoglobin appears to be at his baseline 10.4 with MCV of 91 on 05/28/2019 No sign of overt bleeding  Resolved Suspected situational anxiety No prior history of chronic anxiety or psych illness  Resolved intractable nausea and vomiting, unclear etiology  Obesity BMI 36 Recommend weight loss outpatient with regular physical activity and healthy dieting  Uncontrolled hypertension Blood pressure is stable Continue on Lopressor 50 mg twice daily and Norvasc 5 mg daily. Continue Norvasc to 10 mg daily Follow up with PCP  Code Status:full   Consultants:  Surgery   CT surgery  ID  Procedures:  I&D.  Antimicrobials:  Ancef    Discharge Exam: BP (!) 146/91 (BP Location: Left Arm)    Pulse 87    Temp 98.6 F (37 C) (Oral)    Resp 17    Ht 5' 11"  (1.803 m)    Wt 120.7 kg    SpO2 100%    BMI 37.11 kg/m   General: 48 y.o. year-old male well-developed well-nourished no acute distress.  Alert and oriented x4.    Cardiovascular: Regular rate and rhythm no rubs  or gallops no JVD or thyromegaly noted.    Respiratory: Clear to auscultation no wheezes or rales.  Good respiratory effort.  Abdomen: Soft nontender nondistended bowel sounds present.   Musculoskeletal: No lower extremity edema.  2 out of 4 pulses in all 4 extremities. Left upper chest wound in wound vac. No drainage noted at the time of this  visit.  Psychiatry: Mood is appropriate for condition and setting.  Discharge Instructions You were cared for by a hospitalist during your hospital stay. If you have any questions about your discharge medications or the care you received while you were in the hospital after you are discharged, you can call the unit and asked to speak with the hospitalist on call if the hospitalist that took care of you is not available. Once you are discharged, your primary care physician will handle any further medical issues. Please note that NO REFILLS for any discharge medications will be authorized once you are discharged, as it is imperative that you return to your primary care physician (or establish a relationship with a primary care physician if you do not have one) for your aftercare needs so that they can reassess your need for medications and monitor your lab values.  Discharge Instructions    Home infusion instructions [facility] May follow [place] Dosing Protocol; May administer Cathflo as needed to maintain patency of vascular access device.; Flushing of vascular access device: per [facility] Protocol: 0.9% NaCl pre/post medica...   Complete by: As directed    Instructions: May follow [place] Dosing Protocol   Instructions: May administer Cathflo as needed to maintain patency of vascular access device.   Instructions: Flushing of vascular access device: per [facility] Protocol: 0.9% NaCl pre/post medication administration and prn patency; Heparin 100 u/ml, 62m for implanted ports and Heparin 10u/ml, 530mfor all other central venous catheters.   Instructions: May follow [facility] Anaphylaxis Protocol for First Dose Administration in the home: 0.9% NaCl at 25-50 ml/hr to maintain IV access for protocol meds. Epinephrine 0.3 ml IV/IM PRN and Benadryl 25-50 IV/IM PRN s/s of anaphylaxis.   Instructions: AdBalticnfusion Coordinator (RN) to assist per patient IV care needs in the home PRN.      Allergies as of 06/07/2019   No Known Allergies     Medication List    STOP taking these medications   aspirin EC 81 MG tablet   cyclobenzaprine 10 MG tablet Commonly known as: FLEXERIL   ibuprofen 200 MG tablet Commonly known as: ADVIL   insulin lispro 100 UNIT/ML injection Commonly known as: HUMALOG     TAKE these medications   amLODipine 10 MG tablet Commonly known as: NORVASC Take 1 tablet (10 mg total) by mouth daily.   blood glucose meter kit and supplies Kit Dispense based on patient and insurance preference. Use up to four times daily as directed. (FOR ICD-9 250.00, 250.01).   ceFAZolin  IVPB Commonly known as: ANCEF Inject 2 g into the vein every 8 (eight) hours. Indication:  Osteomyelitis/joint infection Last Day of Therapy:  07/11/2019 Labs - Once weekly:  CBC/D and BMP, Labs - Every other week:  ESR and CRP   gabapentin 300 MG capsule Commonly known as: NEURONTIN Take 1 capsule (300 mg total) by mouth 2 (two) times daily.   glucose blood test strip Needs supply of TRUEMETRIC STRIPS Use as instructed   HYDROcodone-acetaminophen 5-325 MG tablet Commonly known as: NORCO/VICODIN Take 1 tablet by mouth daily as needed for  severe pain. What changed:   how much to take  when to take this  reasons to take this   insulin aspart 100 UNIT/ML FlexPen Commonly known as: NOVOLOG Inject 7 Units into the skin 3 (three) times daily with meals.   Insulin Detemir 100 UNIT/ML Pen Commonly known as: LEVEMIR Inject 20 Units into the skin daily.   Insulin Pen Needle 32G X 5 MM Misc Use with levemir pen as prescribed   metoprolol tartrate 50 MG tablet Commonly known as: LOPRESSOR Take 1 tablet (50 mg total) by mouth 2 (two) times daily.   multivitamin with minerals Tabs tablet Take 1 tablet by mouth daily.   TRUEplus Lancets 28G Misc Use to check blood sugar daily.            Home Infusion Instuctions  (From admission, onward)         Start      Ordered   06/03/19 0000  Home infusion instructions [facility] May follow [place] Dosing Protocol; May administer Cathflo as needed to maintain patency of vascular access device.; Flushing of vascular access device: per [facility] Protocol: 0.9% NaCl pre/post medica...    Question Answer Comment  Instructions May follow [place] Dosing Protocol   Instructions May administer Cathflo as needed to maintain patency of vascular access device.   Instructions Flushing of vascular access device: per [facility] Protocol: 0.9% NaCl pre/post medication administration and prn patency; Heparin 100 u/ml, 71m for implanted ports and Heparin 10u/ml, 540mfor all other central venous catheters.   Instructions May follow [facility] Anaphylaxis Protocol for First Dose Administration in the home: 0.9% NaCl at 25-50 ml/hr to maintain IV access for protocol meds. Epinephrine 0.3 ml IV/IM PRN and Benadryl 25-50 IV/IM PRN s/s of anaphylaxis.   Instructions [facility] Infusion Coordinator (RN) to assist per patient IV care needs in the home PRN.      06/03/19 1700           Durable Medical Equipment  (From admission, onward)         Start     Ordered   06/02/19 0657  For home use only DME Tub bench  Once     06/02/19 0656   06/02/19 0657  For home use only DME 3 n 1  Once     06/02/19 0656         No Known Allergies Follow-up Information    LiLajuana MatteMD Follow up.   Specialty: Cardiothoracic Surgery Why: Monday Oct 23 at 1:15 pm [facility] Contact information: [address] [place] [phone]      NeCharlott RakesMD Follow up in 1 day(s).   Specialty: Family Medicine Why: please call for a post hospital follow up appointment. Contact information: [phone]        SnCarlyle BasquesMD. Call in 1 day(s).   Specialty: Infectious Diseases Why: Please call for a post hospital follow-up  appointment Contact information: [place] [phone]          The results of significant diagnostics from this hospitalization (including imaging, microbiology, ancillary and laboratory) are listed below for reference.    Significant Diagnostic Studies: Ct Chest W Contrast  Result Date: 05/23/2019 CLINICAL DATA:  [age]ear old male with concern for chest wall infection. EXAM: CT CHEST WITH CONTRAST TECHNIQUE: Multidetector CT imaging of the  chest was performed during intravenous contrast administration. CONTRAST:  73m OMNIPAQUE IOHEXOL 300 MG/ML  SOLN COMPARISON:  Chest CT dated 12/17/2018 FINDINGS: Cardiovascular: There is no cardiomegaly or pericardial effusion. Coronary vascular calcifications noted. The thoracic aorta appears unremarkable for the degree opacification. Evaluation of the pulmonary arteries is very limited due to suboptimal opacification and timing contrast. Mediastinum/Nodes: No hilar or mediastinal adenopathy. Esophagus and thyroid gland are grossly unremarkable. No mediastinal fluid collection. Lungs/Pleura: The lungs are clear. There is no pleural effusion pneumothorax. The central airways are patent. Upper Abdomen: No acute abnormality. Musculoskeletal: There is a 5.6 x 2.8 cm ill-defined low attenuating collection in the left pectoralis major which may represent an old hematoma or infected fluid. This appears to extend more superiorly and medially to the left sternoclavicular joint. There is irregularity and fragmentation of the left sternoclavicular joint which may represent an infectious process. Ill-defined low attenuating tubular area in the midline upper chest and suprasternal notch (series 2, image 16) extends to the skin surface and is concerning for fluid. Ultrasound may provide better evaluation of the fluid collection in the left pectoral and anterior chest wall. Old right posterior rib fractures and multilevel  degenerative changes of the spine. No acute osseous pathology. IMPRESSION: 1. Ill-defined low attenuating collection in the left pectoralis major which may represent an old hematoma or infected fluid. This collection appears to extend more superiorly and medially to the left sternoclavicular joint. There is irregularity and fragmentation of the left sternoclavicular joint which may represent an infectious process. Ill-defined low attenuating area in the soft tissues of the midline upper chest and suprasternal notch concerning for fluid. Ultrasound may provide better evaluation of the fluid collection in the left pectoral and anterior chest wall. 2. No acute intrathoracic pathology. Electronically Signed   By: AAnner CreteM.D.   On: 05/23/2019 23:14   UKorea[place]kg Site Rite  Result Date: 06/01/2019 If Site Rite image not attached, placement could not be confirmed due to current cardiac rhythm.   Microbiology: Recent Results (from the past 240 hour(s))  Surgical pcr screen     Status: None   Collection Time: 05/30/19  1:34 AM   Specimen: Nasal Mucosa; Nasal Swab  Result Value Ref Range Status   MRSA, PCR NEGATIVE NEGATIVE Final   Staphylococcus aureus NEGATIVE NEGATIVE Final    Comment: (NOTE) The Xpert SA Assay (FDA approved for NASAL specimens in patients 268years of age and older), is one component of a comprehensive surveillance program. It is not intended to diagnose infection nor to guide or monitor treatment. Performed at [facility] [address] [place] [address]  Aerobic/Anaerobic Culture (surgical/deep wound)     Status: None   Collection Time: 05/30/19 10:19 AM   Specimen: PATH Soft tissue  Result Value Ref Range Status   Specimen Description TISSUE ABSCESS CHEST WALL  Final   Special Requests A  Final   Gram Stain   Final    RARE WBC PRESENT, PREDOMINANTLY PMN NO ORGANISMS SEEN    Culture   Final    RARE STAPHYLOCOCCUS AUREUS CRITICAL RESULT CALLED  TO, READ BACK BY AND VERIFIED WITH: RN S DEAVER [id number] BY CM NO ANAEROBES ISOLATED Performed at [facility] [address], [place] [address]   Report Status 06/04/2019 FINAL  Final   Organism ID, Bacteria STAPHYLOCOCCUS AUREUS  Final      Susceptibility   Staphylococcus aureus - MIC*  CIPROFLOXACIN <=0.5 SENSITIVE Sensitive     ERYTHROMYCIN 0.5 SENSITIVE Sensitive     GENTAMICIN <=0.5 SENSITIVE Sensitive     OXACILLIN 0.5 SENSITIVE Sensitive     TETRACYCLINE <=1 SENSITIVE Sensitive     VANCOMYCIN 1 SENSITIVE Sensitive     TRIMETH/SULFA <=10 SENSITIVE Sensitive     CLINDAMYCIN <=0.25 SENSITIVE Sensitive     RIFAMPIN <=0.5 SENSITIVE Sensitive     Inducible Clindamycin NEGATIVE Sensitive     * RARE STAPHYLOCOCCUS AUREUS  Aerobic/Anaerobic Culture (surgical/deep wound)     Status: None   Collection Time: 05/30/19 10:34 AM   Specimen: PATH Other; Body Fluid  Result Value Ref Range Status   Specimen Description ABSCESS PECTORALIS  Final   Special Requests B  Final   Gram Stain   Final    MODERATE WBC PRESENT, PREDOMINANTLY PMN FEW GRAM POSITIVE COCCI IN PAIRS    Culture   Final    MODERATE STAPHYLOCOCCUS AUREUS NO ANAEROBES ISOLATED Performed at [facility], [address], [place], [place] [address]    Report Status 06/04/2019 FINAL  Final   Organism ID, Bacteria STAPHYLOCOCCUS AUREUS  Final      Susceptibility   Staphylococcus aureus - MIC*    CIPROFLOXACIN <=0.5 SENSITIVE Sensitive     ERYTHROMYCIN <=0.25 SENSITIVE Sensitive     GENTAMICIN <=0.5 SENSITIVE Sensitive     OXACILLIN 0.5 SENSITIVE Sensitive     TETRACYCLINE <=1 SENSITIVE Sensitive     VANCOMYCIN 1 SENSITIVE Sensitive     TRIMETH/SULFA <=10 SENSITIVE Sensitive     CLINDAMYCIN <=0.25 SENSITIVE Sensitive     RIFAMPIN <=0.5 SENSITIVE Sensitive     Inducible Clindamycin NEGATIVE Sensitive     * MODERATE STAPHYLOCOCCUS AUREUS     Labs: Basic Metabolic Panel: Recent Labs  Lab  06/01/19 0245 06/02/19 0318 06/05/19 0810  NA 138 138 131*  K 4.4 4.3 4.5  CL 105 104 95*  CO2 24 24 26   GLUCOSE 124* 252* 357*  BUN 14 13 25*  CREATININE 1.96* 1.59* 1.37*  CALCIUM 8.6* 8.2* 8.6*   Liver Function Tests: No results for input(s): AST, ALT, ALKPHOS, BILITOT, PROT, ALBUMIN in the last 168 hours. No results for input(s): LIPASE, AMYLASE in the last 168 hours. No results for input(s): AMMONIA in the last 168 hours. CBC: No results for input(s): WBC, NEUTROABS, HGB, HCT, MCV, PLT in the last 168 hours. Cardiac Enzymes: No results for input(s): CKTOTAL, CKMB, CKMBINDEX, TROPONINI in the last 168 hours. BNP: BNP (last 3 results) Recent Labs    06/03/19 0944  BNP 351.4*    ProBNP (last 3 results) No results for input(s): PROBNP in the last 8760 hours.  CBG: Recent Labs  Lab 06/06/19 0644 06/06/19 1303 06/06/19 1719 06/06/19 2140 06/07/19 0643  GLUCAP 294* 265* 114* 232* 265*       Signed:  Kayleen Memos, MD [facility] 06/07/2019, 10:44 AM

## 2019-06-07 NOTE — Progress Notes (Signed)
Taylor Hughes to be D/C'd home per MD order.  Discussed prescriptions and follow up appointments with the patient. Prescriptions were sent to pt's preferred pharmacy, medication list explained in detail. Pt verbalized understanding.   Allergies as of 06/07/2019   No Known Allergies     Medication List    STOP taking these medications   aspirin EC 81 MG tablet   cyclobenzaprine 10 MG tablet Commonly known as: FLEXERIL   ibuprofen 200 MG tablet Commonly known as: ADVIL   insulin lispro 100 UNIT/ML injection Commonly known as: HUMALOG     TAKE these medications   amLODipine 10 MG tablet Commonly known as: NORVASC Take 1 tablet (10 mg total) by mouth daily.   blood glucose meter kit and supplies Kit Dispense based on patient and insurance preference. Use up to four times daily as directed. (FOR ICD-9 250.00, 250.01).   ceFAZolin  IVPB Commonly known as: ANCEF Inject 2 g into the vein every 8 (eight) hours. Indication:  Osteomyelitis/joint infection Last Day of Therapy:  07/11/2019 Labs - Once weekly:  CBC/D and BMP, Labs - Every other week:  ESR and CRP   gabapentin 300 MG capsule Commonly known as: NEURONTIN Take 1 capsule (300 mg total) by mouth 2 (two) times daily.   glucose blood test strip Needs supply of TRUEMETRIC STRIPS Use as instructed   HYDROcodone-acetaminophen 5-325 MG tablet Commonly known as: NORCO/VICODIN Take 1 tablet by mouth daily as needed for severe pain. What changed:   how much to take  when to take this  reasons to take this   insulin aspart 100 UNIT/ML FlexPen Commonly known as: NOVOLOG Inject 7 Units into the skin 3 (three) times daily with meals.   Insulin Detemir 100 UNIT/ML Pen Commonly known as: LEVEMIR Inject 20 Units into the skin daily.   Insulin Pen Needle 32G X 5 MM Misc Use with levemir pen as prescribed   metoprolol tartrate 50 MG tablet Commonly known as: LOPRESSOR Take 1 tablet (50 mg total) by mouth 2 (two) times  daily.   multivitamin with minerals Tabs tablet Take 1 tablet by mouth daily.   TRUEplus Lancets 28G Misc Use to check blood sugar daily.            Home Infusion Instuctions  (From admission, onward)         Start     Ordered   06/03/19 0000  Home infusion instructions [facility] May follow [place] Dosing Protocol; May administer Cathflo as needed to maintain patency of vascular access device.; Flushing of vascular access device: per [facility] Protocol: 0.9% NaCl pre/post medica...    Question Answer Comment  Instructions May follow [place] Dosing Protocol   Instructions May administer Cathflo as needed to maintain patency of vascular access device.   Instructions Flushing of vascular access device: per [facility] Protocol: 0.9% NaCl pre/post medication administration and prn patency; Heparin 100 u/ml, 28m for implanted ports and Heparin 10u/ml, 523mfor all other central venous catheters.   Instructions May follow [facility] Anaphylaxis Protocol for First Dose Administration in the home: 0.9% NaCl at 25-50 ml/hr to maintain IV access for protocol meds. Epinephrine 0.3 ml IV/IM PRN and Benadryl 25-50 IV/IM PRN s/s of anaphylaxis.   Instructions [facility] Infusion Coordinator (RN) to assist per patient IV care needs in the home PRN.      06/03/19 1700           Durable Medical Equipment  (From admission, onward)  Start     Ordered   06/02/19 0657  For home use only DME Tub bench  Once     06/02/19 0656   06/02/19 0657  For home use only DME 3 n 1  Once     06/02/19 0656          Vitals:   06/07/19 0427 06/07/19 0752  BP: 132/85 (!) 146/91  Pulse: 85 87  Resp: 14 17  Temp: 98.6 F (37 C) 98.6 F (37 C)  SpO2: 99% 100%    Pt denies pain at this time. Sent home with PICC in right upper arm to receive home IV abx, and also sent home with wound vac to left upper chest. Education on wound vac and extra supplies provided.  An After Visit Summary was  printed and given to the patient. Patient escorted via [place], and D/C home via private auto.  Conley Simmonds N 06/07/2019 3:12 PM

## 2019-06-07 NOTE — TOC Transition Note (Signed)
Transition of Care [facility]) - CM/SW Discharge Note   Patient Details  Name: Taylor Hughes MRN: [id number] Date of Birth: [birth year]  [facility] CM/SW Contact:  Atilano Median, LCSW Phone Number: 06/07/2019, 4:10 PM   Clinical Narrative:    Patient was discharged home with home health nursing and physical therapy. Referral coordinated with Tracy(wound vac), Pam(IV infusion), and Ellen(home health). Patient aware and agreeable to this plan. Wound vac delivered to bedside prior to discharge. No other needs at this time. Case closed to this SW.    Final next level of care: [place] Barriers to Discharge: Barriers Resolved   Patient Goals and CMS Choice Patient states their goals for this hospitalization and ongoing recovery are:: getting home with the care that he needs CMS Medicare.gov Compare Post Acute Care list provided to:: Patient Choice offered to / list presented to : Patient  Discharge Placement     Discharge Plan and Services     Post Acute Care Choice: Durable Medical Equipment, Home Health          DME Arranged: Vac DME Agency: [employer] Date DME Agency Contacted: 06/06/19 Time DME Agency Contacted: 0900 Representative spoke with at DME Agency: Olivia Mackie HH Arranged: RN, PT [facility] Agency: [facility] Date Power: 06/02/19 Time [place]: 1303 Representative spoke with at [place]: [place]) Interventions     Readmission Risk Interventions No flowsheet data found.

## 2019-06-10 ENCOUNTER — Telehealth: Payer: Self-pay | Admitting: Family Medicine

## 2019-06-10 MED FILL — !LEVEMIR FLEXPEN 100UNITS/M: 100U/ML (3) | 30 days supply | Qty: 6 | Fill #0

## 2019-06-10 MED FILL — TRUE METRIX GLUCOSE TEST ST: 25 days supply | Qty: 100 | Fill #0

## 2019-06-10 MED FILL — !TRUE METRIX BLOOD GLUCOSE: 1 days supply | Qty: 1 | Fill #0

## 2019-06-10 MED FILL — TRUEplus LANCETS 28G MISC: 25 days supply | Qty: 100 | Fill #0

## 2019-06-10 NOTE — Telephone Encounter (Signed)
Will route to PCP for review. 

## 2019-06-10 NOTE — Telephone Encounter (Signed)
guadalupe with [facility] called to know if lab orders were put in for [facility]. Please follow up

## 2019-06-10 NOTE — Telephone Encounter (Signed)
I am unsure of what is being referred to as he is not on a blood thinner necessitating INR. They might have to contact the Physician requesting that.

## 2019-06-14 ENCOUNTER — Emergency Department (HOSPITAL_COMMUNITY): Payer: Self-pay

## 2019-06-14 ENCOUNTER — Encounter (HOSPITAL_COMMUNITY): Payer: Self-pay | Admitting: Emergency Medicine

## 2019-06-14 ENCOUNTER — Emergency Department (HOSPITAL_COMMUNITY)
Admission: EM | Admit: 2019-06-14 | Discharge: 2019-06-14 | Disposition: A | Discharge status: Home / Self Care | Payer: Self-pay | Attending: Emergency Medicine | Admitting: Emergency Medicine

## 2019-06-14 DIAGNOSIS — B999 Unspecified infectious disease: Secondary | ICD-10-CM

## 2019-06-14 DIAGNOSIS — Z794 Long term (current) use of insulin: Secondary | ICD-10-CM | POA: Insufficient documentation

## 2019-06-14 DIAGNOSIS — E119 Type 2 diabetes mellitus without complications: Secondary | ICD-10-CM | POA: Insufficient documentation

## 2019-06-14 DIAGNOSIS — I10 Essential (primary) hypertension: Secondary | ICD-10-CM | POA: Insufficient documentation

## 2019-06-14 DIAGNOSIS — Z452 Encounter for adjustment and management of vascular access device: Secondary | ICD-10-CM

## 2019-06-14 DIAGNOSIS — Y999 Unspecified external cause status: Secondary | ICD-10-CM | POA: Insufficient documentation

## 2019-06-14 DIAGNOSIS — Y712 Prosthetic and other implants, materials and accessory cardiovascular devices associated with adverse incidents: Secondary | ICD-10-CM | POA: Insufficient documentation

## 2019-06-14 DIAGNOSIS — T82898A Other specified complication of vascular prosthetic devices, implants and grafts, initial encounter: Secondary | ICD-10-CM | POA: Insufficient documentation

## 2019-06-14 LAB — BASIC METABOLIC PANEL
Anion gap: 11 (ref 5–15)
BUN: 21 mg/dL — ABNORMAL HIGH (ref 6–20)
CO2: 23 mmol/L (ref 22–32)
Calcium: 9.5 mg/dL (ref 8.9–10.3)
Chloride: 104 mmol/L (ref 98–111)
Creatinine, Ser: 0.93 mg/dL (ref 0.61–1.24)
GFR calc Af Amer: >60 mL/min (ref >60)
GFR calc non Af Amer: >60 mL/min (ref >60)
Glucose, Bld: 166 mg/dL — ABNORMAL HIGH (ref 70–99)
Potassium: 4.3 mmol/L (ref 3.5–5.1)
Sodium: 138 mmol/L (ref 135–145)

## 2019-06-14 LAB — CBG MONITORING, ED: Glucose-Capillary: 186 mg/dL — ABNORMAL HIGH (ref 70–99)

## 2019-06-14 MED ORDER — LIDOCAINE HCL 1 % IJ SOLN
INTRAMUSCULAR | Status: AC
  Filled 2019-06-14: qty 20

## 2019-06-14 MED ORDER — INSULIN ASPART 100 UNIT/ML ~~LOC~~ SOLN
7.0000 [IU] | Freq: Once | SUBCUTANEOUS | Status: AC
  Administered 2019-06-14: 13:00:00 7 [IU] via SUBCUTANEOUS

## 2019-06-14 MED ORDER — HEPARIN SOD (PORK) LOCK FLUSH 100 UNIT/ML IV SOLN
INTRAVENOUS | Status: AC
  Administered 2019-06-14: 400 [IU]
  Filled 2019-06-14: qty 5

## 2019-06-14 MED ORDER — CHLORHEXIDINE GLUCONATE 4 % EX LIQD
CUTANEOUS | Status: AC
  Filled 2019-06-14: qty 15

## 2019-06-14 NOTE — ED Provider Notes (Addendum)
[facility] EMERGENCY DEPARTMENT Provider Note   CSN: [id number] Arrival date & time: 06/14/19  1016     History   Chief Complaint Chief Complaint  Patient presents with  . picc line clogged    HPI Taylor Hughes is a 48 y.o. male.     HPI   48 year old male presents today for PICC line complication.  Patient with a history of left chest wall abscess I&D with placement of wound VAC on 05/30/2019.  Patient discharged on 06/06/2019 after PICC line placed on 06/02/2019 in the right arm.  Patient has been receiving infusions at home.  Patient notes he was able to get half of his antibiotics and this morning for the PICC line stopped working.  He denies any manipulation of the PICC line.  He notes the infection is doing well with no complaints.  Past Medical History:  Diagnosis Date  . Atrial fibrillation ([place])   . Diabetes mellitus without complication (HCC)    Type 2  . GERD (gastroesophageal reflux disease)   . Wears glasses   . Wound, open, foot    left diabetic     Patient Active Problem List   Diagnosis Date Noted  . Sepsis ([place]) 05/24/2019  . Cellulitis of chest wall   . Venous insufficiency (chronic) (peripheral) 03/15/2019  . Edema of both lower extremities due to peripheral venous insufficiency 03/15/2019  . Hypoglycemia due to insulin 12/18/2018  . Acute encephalopathy 12/17/2018  . Neck abscess 11/23/2018  . Essential hypertension 11/23/2018  . AKI (acute kidney injury) ([place]) 11/23/2018  . Obesity (BMI 30.0-34.9) 11/23/2018  . Type 2 diabetes mellitus with other specified complication ([place]) 77/82/4235  . Sepsis due to group B Streptococcus ([place])   . Diabetic infection of left foot ([place])   . Left foot infection   . DKA, type 2 ([place]) 07/28/2018  . Leukocytosis 07/28/2018  . Hyperkalemia 07/28/2018  . Hyponatremia 07/28/2018    Past Surgical History:  Procedure Laterality Date  . AMPUTATION Left 08/20/2018   Procedure: LEFT FOOT IRRIGATON  AND DEBRIDEMENT, 5TH RAY AMPUTATION;  Surgeon: Newt Minion, MD;  Location: [facility];  Service: Orthopedics;  Laterality: Left;  . APPLICATION OF A-CELL OF CHEST/ABDOMEN N/A 05/30/2019   Procedure: Application Of A-Cell Of Chest;  Surgeon: Lajuana Matte, MD;  Location: [place];  Service: Cardiothoracic;  Laterality: N/A;  . APPLICATION OF WOUND VAC N/A 05/30/2019   Procedure: Application Of Wound Vac;  Surgeon: Lajuana Matte, MD;  Location: [place];  Service: Cardiothoracic;  Laterality: N/A;  . FINGER SURGERY Right 2016   I&D  small finger  . I&D EXTREMITY Left 07/30/2018   Procedure: IRRIGATION AND DEBRIDEMENT LEFT FOOT WITH POSSIBLE AMPUTATION OF FIFTH TOE;  Surgeon: Mcarthur Rossetti, MD;  Location: [facility];  Service: Orthopedics;  Laterality: Left;  . INCISION AND DRAINAGE ABSCESS Left 01/19/2019   Procedure: INCISION AND DRAINAGE ABSCESS upper chest;  Surgeon: Melida Quitter, MD;  Location: [place];  Service: ENT;  Laterality: Left;  . IRRIGATION AND DEBRIDEMENT STERNOCLAVICULAR JOINT-STERNUM AND RIBS N/A 05/30/2019   Procedure: IRRIGATION AND DEBRIDEMENT OF STERNOCLAVICULAR JOINT-STERNUM AND RIBS ;  Surgeon: Lajuana Matte, MD;  Location: [place];  Service: Cardiothoracic;  Laterality: N/A;  . MINOR IRRIGATION AND DEBRIDEMENT OF WOUND N/A 11/22/2018   Procedure: INCISION AND DRAINAGE OF NECK ABSCESS;  Surgeon: Melida Quitter, MD;  Location: [facility];  Service: ENT;  Laterality: N/A;        Home Medications  Prior to Admission medications   Medication Sig Start Date End Date Taking? Authorizing Provider  amLODipine (NORVASC) 10 MG tablet Take 1 tablet (10 mg total) by mouth daily. 06/07/19   Kayleen Memos, DO  blood glucose meter kit and supplies KIT Dispense based on patient and insurance preference. Use up to four times daily as directed. (FOR ICD-9 250.00, 250.01). 06/07/19   Kayleen Memos, DO  ceFAZolin (ANCEF) IVPB Inject 2 g into the vein every 8 (eight) hours.  Indication:  Osteomyelitis/joint infection Last Day of Therapy:  07/11/2019 Labs - Once weekly:  CBC/D and BMP, Labs - Every other week:  ESR and CRP 06/07/19 07/17/19  Kayleen Memos, DO  gabapentin (NEURONTIN) 300 MG capsule Take 1 capsule (300 mg total) by mouth 2 (two) times daily. 06/07/19   Kayleen Memos, DO  glucose blood test strip Needs supply of TRUEMETRIC STRIPS Use as instructed 12/22/18   Charlott Rakes, MD  HYDROcodone-acetaminophen (NORCO/VICODIN) 5-325 MG tablet Take 1 tablet by mouth daily as needed for severe pain. 06/07/19   Kayleen Memos, DO  insulin aspart (NOVOLOG) 100 UNIT/ML FlexPen Inject 7 Units into the skin 3 (three) times daily with meals. 06/07/19   Kayleen Memos, DO  Insulin Detemir (LEVEMIR) 100 UNIT/ML Pen Inject 20 Units into the skin daily. 06/07/19   Kayleen Memos, DO  Insulin Pen Needle 32G X 5 MM MISC Use with levemir pen as prescribed 11/29/18   [place] Donnamarie Poag, DO  metoprolol tartrate (LOPRESSOR) 50 MG tablet Take 1 tablet (50 mg total) by mouth 2 (two) times daily. 06/07/19 07/07/19  Kayleen Memos, DO  Multiple Vitamin (MULTIVITAMIN WITH MINERALS) TABS tablet Take 1 tablet by mouth daily. 06/07/19   Kayleen Memos, DO  TRUEplus Lancets 28G MISC Use to check blood sugar daily. 05/11/19   Charlott Rakes, MD    Family History Family History  Problem Relation Age of Onset  . Diabetes Mother     Social History Social History   Tobacco Use  . Smoking status: Never Smoker  . Smokeless tobacco: Never Used  Substance Use Topics  . Alcohol use: No    Alcohol/week: 0.0 standard drinks  . Drug use: No     Allergies   Patient has no known allergies.   Review of Systems Review of Systems  All other systems reviewed and are negative.    Physical Exam Updated Vital Signs BP (!) 137/95   Pulse 83   Temp 98.4 F (36.9 C) (Oral)   Resp 16   SpO2 97%   Physical Exam Vitals signs and nursing note reviewed.  Constitutional:       Appearance: He is well-developed.  HENT:     Head: Normocephalic and atraumatic.  Eyes:     General: No scleral icterus.       Right eye: No discharge.        Left eye: No discharge.     Conjunctiva/sclera: Conjunctivae normal.     Pupils: Pupils are equal, round, and reactive to light.  Neck:     Musculoskeletal: Normal range of motion.     Vascular: No JVD.     Trachea: No tracheal deviation.  Pulmonary:     Effort: Pulmonary effort is normal.     Breath sounds: No stridor.     Comments: Wound VAC noted on left anterior chest no surrounding redness Musculoskeletal:     Comments: PICC noted in right upper extremity  Neurological:  Mental Status: He is alert and oriented to person, place, and time.     Coordination: Coordination normal.  Psychiatric:        Behavior: Behavior normal.        Thought Content: Thought content normal.        Judgment: Judgment normal.      ED Treatments / Results  Labs (all labs ordered are listed, but only abnormal results are displayed) Labs Reviewed  BASIC METABOLIC PANEL - Abnormal; Notable for the following components:      Result Value   Glucose, Bld 166 (*)    BUN 21 (*)    All other components within normal limits  CBG MONITORING, ED - Abnormal; Notable for the following components:   Glucose-Capillary 186 (*)    All other components within normal limits    EKG None  Radiology No results found.  Procedures Procedures (including critical care time)  Medications Ordered in ED Medications  lidocaine (XYLOCAINE) 1 % (with pres) injection (has no administration in time range)  chlorhexidine (HIBICLENS) 4 % liquid (has no administration in time range)  insulin aspart (novoLOG) injection 7 Units (7 Units Subcutaneous Given 06/14/19 1327)  heparin lock flush 100 UNIT/ML injection (400 Units  Given 06/14/19 1522)     Initial Impression / Assessment and Plan / ED Course  I have reviewed the triage vital signs and the nursing  notes.  Pertinent labs & imaging results that were available during my care of the patient were reviewed by me and considered in my medical decision making (see chart for details).        48 year old male presents today for PICC line complication.  IV team consulted who evaluated the PICC line reporting that it has been pulled out and needs to be replaced.  Order was placed for IV team to replace the PICC line.  I was consulted by them reporting they would not be able to replace his until 7 PM tonight and recommended IR placement.  Discussed case with IR PA Pam who agrees for PICC line replacement.  I reviewed patient's previous labs he did have slight elevation in creatinine at 1.37 on 06/05/2019, this was improving and suspected prerenal secondary to poor oral intake, no indication that this is chronic in nature as his baseline appears to be around 0.9 with a GFR greater than 60.  No indication that this patient would require dialysis or trending in that direction, I do feel that PICC line can be placed in the right arm again.  Patient was able to have PICC inserted by IR.  Patient will continue using antibiotics as directed discharged with return precautions.  Verbalized understanding and agreement to today's plan.  Final Clinical Impressions(s) / ED Diagnoses   Final diagnoses:  Infection  Encounter for assessment of peripherally inserted central catheter (PICC)    ED Discharge Orders    None       Okey Regal, PA-C 06/14/19 1345    Okey Regal, PA-C 06/14/19 1542    Lacretia Leigh, MD 06/16/19 1318

## 2019-06-14 NOTE — Discharge Instructions (Addendum)
Please continue using antibiotics as directed please return immediately if you develop any new or worsening signs or symptoms.

## 2019-06-14 NOTE — ED Notes (Signed)
Gave pt [place] sandwich, crackers/cheese and diet gingerale

## 2019-06-14 NOTE — Procedures (Signed)
Interventional Radiology Procedure Note  Procedure: Replacement of RUE PICC.  37cm SL at the cavoatrial jxn.  Complications: None Recommendations:  - Ok to use - Do not submerge - Routine line care   Signed,  Dulcy Fanny. Earleen Newport, DO

## 2019-06-14 NOTE — Progress Notes (Signed)
Spoke with [facility] RN re: exchanged PICC. IR will do it.

## 2019-06-14 NOTE — Progress Notes (Signed)
Spoke with ED physician in regards to PICC exchange. Please refer to IR for exchange due to PICC nurses on other campus at this time.

## 2019-06-14 NOTE — ED Notes (Signed)
Pt taken to IR for PICC placement.

## 2019-06-14 NOTE — ED Triage Notes (Signed)
PICC line not working this am-- has been getting antibiotics for post op infection- had bandage changed per [facility] yesterday-- worked without any problems yesterday.

## 2019-06-14 NOTE — ED Notes (Signed)
Pt got half of antibiotic in when could not get anymore in. RN consulted pharm who advised to to discard rest of it and start anew with next dose.

## 2019-06-14 NOTE — Progress Notes (Signed)
Assessed R SL PICC, catheter noticed to be out more than 7 cm within the dressing. Spoke with patient that it needs to be exchanged or replaced because patient has long term antibiotic until 11/30. RN and PA made aware.

## 2019-06-14 NOTE — ED Notes (Signed)
Patient verbalizes understanding of discharge instructions. Opportunity for questioning and answers were provided. Armband removed by staff, pt discharged from ED ambulatory.   

## 2019-06-17 ENCOUNTER — Ambulatory Visit (INDEPENDENT_AMBULATORY_CARE_PROVIDER_SITE_OTHER): Payer: Self-pay | Admitting: Thoracic Surgery (Cardiothoracic Vascular Surgery)

## 2019-06-17 ENCOUNTER — Encounter: Payer: Self-pay | Admitting: Thoracic Surgery (Cardiothoracic Vascular Surgery)

## 2019-06-17 ENCOUNTER — Other Ambulatory Visit: Payer: Self-pay

## 2019-06-17 VITALS — BP 111/71 | HR 86 | Temp 98.6°F | Resp 20 | Ht 71.0 in | Wt 293.2 lb

## 2019-06-17 DIAGNOSIS — L02213 Cutaneous abscess of chest wall: Secondary | ICD-10-CM

## 2019-06-17 NOTE — Progress Notes (Signed)
     [address]       [place] [address]             [phone]       Taylor Hughes comes in for follow-up wound check today.  Overall he is done well and has had his wound VAC changed by home health.  The wound is healing nicely with a good granulation base.  The ACell matrix is still in the middle of the wound.  The wound measures 12 x 2.5 cm.  It has mostly filled in regards to depth  I will see him in follow-up in 2 weeks at which time we likely will transition to wet-to-dry dressing changes.

## 2019-06-21 ENCOUNTER — Other Ambulatory Visit: Payer: Self-pay

## 2019-06-21 ENCOUNTER — Ambulatory Visit: Payer: Self-pay | Attending: Family Medicine | Admitting: Family Medicine

## 2019-06-21 DIAGNOSIS — Z794 Long term (current) use of insulin: Secondary | ICD-10-CM

## 2019-06-21 DIAGNOSIS — R600 Localized edema: Secondary | ICD-10-CM

## 2019-06-21 DIAGNOSIS — I10 Essential (primary) hypertension: Secondary | ICD-10-CM

## 2019-06-21 DIAGNOSIS — E1169 Type 2 diabetes mellitus with other specified complication: Secondary | ICD-10-CM

## 2019-06-21 MED ORDER — GABAPENTIN 300 MG PO CAPS: 300.0000 mg | ORAL_CAPSULE | Freq: Two times a day (BID) | ORAL | 2 refills | Status: DC

## 2019-06-21 MED ORDER — LISINOPRIL-HYDROCHLOROTHIAZIDE 10-12.5 MG PO TABS: 1.0000 | ORAL_TABLET | Freq: Every day | ORAL | 3 refills | Status: DC

## 2019-06-21 MED ORDER — INSULIN DETEMIR 100 UNIT/ML FLEXPEN: 20.0000 [IU] | PEN_INJECTOR | Freq: Every day | SUBCUTANEOUS | 3 refills | Status: DC

## 2019-06-21 MED ORDER — METOPROLOL TARTRATE 50 MG PO TABS: 50.0000 mg | ORAL_TABLET | Freq: Two times a day (BID) | ORAL | 1 refills | Status: DC

## 2019-06-21 MED ORDER — INSULIN ASPART 100 UNIT/ML FLEXPEN: 7.0000 [IU] | PEN_INJECTOR | Freq: Three times a day (TID) | SUBCUTANEOUS | 3 refills | Status: DC

## 2019-06-21 MED FILL — LISINOPRIL-HCTZ 10-12.5 MG: 10-12.5 | 30 days supply | Qty: 30 | Fill #0

## 2019-06-21 MED FILL — !NOVOLOG FLEXPEN SYRINGE 1: 100/ML | 15 days supply | Qty: 3 | Fill #0

## 2019-06-21 NOTE — Progress Notes (Signed)
Patient has been called and DOB has been verified. Patient has been screened and transferred to PCP to start phone visit.    Patient is having pain in feet and legs.

## 2019-06-21 NOTE — Progress Notes (Signed)
Virtual Visit via Telephone Note  I connected with Taylor Hughes, on 06/21/2019 at 2:08 PM by telephone due to the COVID-19 pandemic and verified that I am speaking with the correct person using two identifiers.   Consent: I discussed the limitations, risks, security and privacy concerns of performing an evaluation and management service by telephone and the availability of in person appointments. I also discussed with the patient that there may be a patient responsible charge related to this service. The patient expressed understanding and agreed to proceed.   Location of Patient: Home  Location of Provider: Clinic   Persons participating in Telemedicine visit: Taylor Hughes Dr. Margarita Rana     History of Present Illness: Taylor Hughes is a 48 year old male with a history of type 2 diabetes mellitus (A1c of 12.7 from 11/2018), L fifth toe ray amputation, hypertension, neck abscess who is seen for follow-up visit today.  Incision and drainage of left chest wall abscess with placement of wound VAC on 05/30/2019 and currently has a PICC line through which he receives antibiotic infusions at home.  PICC line had to be changed last week due to PICC line complication.  He had a follow-up visit with cardiac surgery on 06/17/2019.  He complains his nurse noticed he had pedal edema and pointed out he has CHF. He complains of inability to stand for greater than 15 minutes; he has significant  Leg swelling and states this has been going on for several years. It is better when he wakes up but worsens at the end of the day. I have reviewed his echocardiogram from 07/30/2018 which reveals EF of 65 to 70%, normal wall motion, no regional wall motion abnormalities, mild LVH. He has been unable to work for the last 1 year and is trying to apply for disability.  Blood sugars have been around 97-150 and highest sugar was 175.  He endorses compliance with his medications for diabetes and his  antihypertensives.  Past Medical History:  Diagnosis Date  . Atrial fibrillation ([place])   . Diabetes mellitus without complication (HCC)    Type 2  . GERD (gastroesophageal reflux disease)   . Wears glasses   . Wound, open, foot    left diabetic    No Known Allergies  Current Outpatient Medications on File Prior to Visit  Medication Sig Dispense Refill  . amLODipine (NORVASC) 10 MG tablet Take 1 tablet (10 mg total) by mouth daily. 30 tablet 0  . blood glucose meter kit and supplies KIT Dispense based on patient and insurance preference. Use up to four times daily as directed. (FOR ICD-9 250.00, 250.01). 1 each 0  . ceFAZolin (ANCEF) IVPB Inject 2 g into the vein every 8 (eight) hours. Indication:  Osteomyelitis/joint infection Last Day of Therapy:  07/11/2019 Labs - Once weekly:  CBC/D and BMP, Labs - Every other week:  ESR and CRP 120 Units 0  . gabapentin (NEURONTIN) 300 MG capsule Take 1 capsule (300 mg total) by mouth 2 (two) times daily. 60 capsule 0  . glucose blood test strip Needs supply of TRUEMETRIC STRIPS Use as instructed 100 each 2  . insulin aspart (NOVOLOG) 100 UNIT/ML FlexPen Inject 7 Units into the skin 3 (three) times daily with meals. 15 mL 0  . Insulin Detemir (LEVEMIR) 100 UNIT/ML Pen Inject 20 Units into the skin daily. 15 mL 0  . Insulin Pen Needle 32G X 5 MM MISC Use with levemir pen as prescribed 100 each 0  . metoprolol  tartrate (LOPRESSOR) 50 MG tablet Take 1 tablet (50 mg total) by mouth 2 (two) times daily. 60 tablet 0  . Multiple Vitamin (MULTIVITAMIN WITH MINERALS) TABS tablet Take 1 tablet by mouth daily. 30 tablet 0  . TRUEplus Lancets 28G MISC Use to check blood sugar daily. 100 each 11  . HYDROcodone-acetaminophen (NORCO/VICODIN) 5-325 MG tablet Take 1 tablet by mouth daily as needed for severe pain. (Patient not taking: Reported on 06/21/2019) 6 tablet 0   No current facility-administered medications on file prior to visit.      Observations/Objective: Awake, alert, oriented x3 Not in acute distress  CMP Latest Ref Rng & Units 06/14/2019 06/05/2019 06/02/2019  Glucose 70 - 99 mg/dL 166(H) 357(H) 252(H)  BUN 6 - 20 mg/dL 21(H) 25(H) 13  Creatinine 0.61 - 1.24 mg/dL 0.93 1.37(H) 1.59(H)  Sodium 135 - 145 mmol/L 138 131(L) 138  Potassium 3.5 - 5.1 mmol/L 4.3 4.5 4.3  Chloride 98 - 111 mmol/L 104 95(L) 104  CO2 22 - 32 mmol/L 23 26 24   Calcium 8.9 - 10.3 mg/dL 9.5 8.6(L) 8.2(L)  Total Protein 6.5 - 8.1 g/dL - - -  Total Bilirubin 0.3 - 1.2 mg/dL - - -  Alkaline Phos 38 - 126 U/L - - -  AST 15 - 41 U/L - - -  ALT 0 - 44 U/L - - -    Lab Results  Component Value Date   HGBA1C 12.7 (H) 05/27/2019    Assessment and Plan: 1. Type 2 diabetes mellitus with other specified complication, with long-term current use of insulin (HCC) Uncontrolled with A1c of 12.7 Blood sugars reveal improvement No regimen change today - metoprolol tartrate (LOPRESSOR) 50 MG tablet; Take 1 tablet (50 mg total) by mouth 2 (two) times daily.  Dispense: 60 tablet; Refill: 1  2. Pedal edema Diuretic added to blood pressure regimen and amlodipine discontinued He likely has a component of chronic venous insufficiency given his weight Advised to use compression stockings, reduce sodium intake Weight loss to be beneficial  3. Morbid obesity ([place]) Counseled on reducing portion sizes, avoiding late meals  4.  Essential hypertension Switch from amlodipine to lisinopril/HCTZ due to diuretic benefit of the latter Counseled on blood pressure goal of less than 130/80, low-sodium, DASH diet, medication compliance, 150 minutes of moderate intensity exercise per week. Discussed medication compliance, adverse effects.  Follow Up Instructions: 3 weeks in person    I discussed the assessment and treatment plan with the patient. The patient was provided an opportunity to ask questions and all were answered. The patient agreed with the plan  and demonstrated an understanding of the instructions.   The patient was advised to call back or seek an in-person evaluation if the symptoms worsen or if the condition fails to improve as anticipated.     I provided 15 minutes total of non-face-to-face time during this encounter including median intraservice time, reviewing previous notes, labs, imaging, medications, management and patient verbalized understanding.     Charlott Rakes, MD, FAAFP. [facility] and [address], [place]   06/21/2019, 2:08 PM

## 2019-06-22 ENCOUNTER — Encounter: Payer: Self-pay | Admitting: Family Medicine

## 2019-06-22 ENCOUNTER — Emergency Department (HOSPITAL_COMMUNITY)
Admission: EM | Admit: 2019-06-22 | Discharge: 2019-06-23 | Disposition: A | Discharge status: Home / Self Care | Payer: Self-pay | Attending: Emergency Medicine | Admitting: Emergency Medicine

## 2019-06-22 ENCOUNTER — Encounter (HOSPITAL_COMMUNITY): Payer: Self-pay | Admitting: Emergency Medicine

## 2019-06-22 ENCOUNTER — Other Ambulatory Visit: Payer: Self-pay

## 2019-06-22 DIAGNOSIS — Z452 Encounter for adjustment and management of vascular access device: Secondary | ICD-10-CM | POA: Insufficient documentation

## 2019-06-22 DIAGNOSIS — E119 Type 2 diabetes mellitus without complications: Secondary | ICD-10-CM | POA: Insufficient documentation

## 2019-06-22 DIAGNOSIS — T82898A Other specified complication of vascular prosthetic devices, implants and grafts, initial encounter: Secondary | ICD-10-CM

## 2019-06-22 DIAGNOSIS — Z794 Long term (current) use of insulin: Secondary | ICD-10-CM | POA: Insufficient documentation

## 2019-06-22 DIAGNOSIS — Z79899 Other long term (current) drug therapy: Secondary | ICD-10-CM | POA: Insufficient documentation

## 2019-06-22 NOTE — ED Triage Notes (Signed)
Pt was here last week for same problem. Pt reports picc line is not working, giving him resistance. Pt is taking at home IV antibiotics for post op infection. Denies pain. No drainage or redness noted to line.

## 2019-06-23 ENCOUNTER — Emergency Department (HOSPITAL_COMMUNITY): Payer: Self-pay

## 2019-06-23 MED ORDER — HEPARIN SOD (PORK) LOCK FLUSH 100 UNIT/ML IV SOLN
250.0000 [IU] | INTRAVENOUS | Status: AC | PRN
  Administered 2019-06-23: 250 [IU]

## 2019-06-23 MED ORDER — CEFAZOLIN SODIUM-DEXTROSE 2-4 GM/100ML-% IV SOLN: 2.0000 g | Freq: Three times a day (TID) | INTRAVENOUS | Status: DC

## 2019-06-23 NOTE — Discharge Instructions (Signed)
Your PICC line is now functioning and you are safe to be discharged home and give yourself your antibiotic infusion as scheduled.

## 2019-06-23 NOTE — ED Notes (Signed)
Pt so rude to dr ward thst she will let the oncoming doctor see this pt

## 2019-06-23 NOTE — ED Provider Notes (Signed)
TIME SEEN: 6:38 AM  CHIEF COMPLAINT: PICC line dysfunction  HPI: Patient is a 48 year old male with recent I&D on 05/30/2019 by CT surgery for left pectoralis abscess and cellulitis who presents to the emergency department with a PICC line dysfunction.  Patient has a PICC line to his right upper extremity that was initially placed in the hospital on 06/02/2019.  Had to be replaced in the ED by IR on 06/14/2019.  Comes back in today because the PICC line is not infusing his antibiotics properly.  Last dose of antibiotics given around 3 PM and he only received half a dose.  Did not get his 10 PM antibiotics.  He is on Ancef 2 g IV every 8 hours for 6 weeks.  States that he does not remember getting the PICC line caught on anything.  States that he does not think it pulled out at all.  It is not painful, red or draining.  He thinks it may have clotted.  Also reports due to his prolonged wait time his wound VAC battery is dying.  Patient also requesting to eat.  States he is a diabetic and needs something to eat.  Also upset because he has a 9 AM court date today.  ROS: See HPI Constitutional: no fever  Eyes: no drainage  ENT: no runny nose   Cardiovascular:  no chest pain  Resp: no SOB  GI: no vomiting GU: no dysuria Integumentary: no rash  Allergy: no hives  Musculoskeletal: no leg swelling  Neurological: no slurred speech ROS otherwise negative  PAST MEDICAL HISTORY/PAST SURGICAL HISTORY:  Past Medical History:  Diagnosis Date  . Atrial fibrillation ([place])   . Diabetes mellitus without complication (HCC)    Type 2  . GERD (gastroesophageal reflux disease)   . Wears glasses   . Wound, open, foot    left diabetic     MEDICATIONS:  Prior to Admission medications   Medication Sig Start Date End Date Taking? Authorizing Provider  blood glucose meter kit and supplies KIT Dispense based on patient and insurance preference. Use up to four times daily as directed. (FOR ICD-9 250.00,  250.01). 06/07/19   Kayleen Memos, DO  ceFAZolin (ANCEF) IVPB Inject 2 g into the vein every 8 (eight) hours. Indication:  Osteomyelitis/joint infection Last Day of Therapy:  07/11/2019 Labs - Once weekly:  CBC/D and BMP, Labs - Every other week:  ESR and CRP 06/07/19 07/17/19  Kayleen Memos, DO  gabapentin (NEURONTIN) 300 MG capsule Take 1 capsule (300 mg total) by mouth 2 (two) times daily. 06/21/19   Charlott Rakes, MD  glucose blood test strip Needs supply of TRUEMETRIC STRIPS Use as instructed 12/22/18   Charlott Rakes, MD  HYDROcodone-acetaminophen (NORCO/VICODIN) 5-325 MG tablet Take 1 tablet by mouth daily as needed for severe pain. Patient not taking: Reported on 06/21/2019 06/07/19   Kayleen Memos, DO  insulin aspart (NOVOLOG) 100 UNIT/ML FlexPen Inject 7 Units into the skin 3 (three) times daily with meals. 06/21/19   Charlott Rakes, MD  Insulin Detemir (LEVEMIR) 100 UNIT/ML Pen Inject 20 Units into the skin daily. 06/21/19   Charlott Rakes, MD  Insulin Pen Needle 32G X 5 MM MISC Use with levemir pen as prescribed 11/29/18   [place] Donnamarie Poag, DO  lisinopril-hydrochlorothiazide (ZESTORETIC) 10-12.5 MG tablet Take 1 tablet by mouth daily. 06/21/19   Charlott Rakes, MD  metoprolol tartrate (LOPRESSOR) 50 MG tablet Take 1 tablet (50 mg total) by mouth 2 (two) times daily.  06/21/19 07/21/19  Charlott Rakes, MD  Multiple Vitamin (MULTIVITAMIN WITH MINERALS) TABS tablet Take 1 tablet by mouth daily. 06/07/19   Kayleen Memos, DO  TRUEplus Lancets 28G MISC Use to check blood sugar daily. 05/11/19   Charlott Rakes, MD    ALLERGIES:  No Known Allergies  SOCIAL HISTORY:  Social History   Tobacco Use  . Smoking status: Never Smoker  . Smokeless tobacco: Never Used  Substance Use Topics  . Alcohol use: No    Alcohol/week: 0.0 standard drinks    FAMILY HISTORY: Family History  Problem Relation Age of Onset  . Diabetes Mother     EXAM: BP 122/80 (BP Location: Left Arm)    Pulse 73   Temp 98.5 F (36.9 C) (Oral)   Resp 18   Ht 5' 11"  (1.803 m)   Wt 125.6 kg   SpO2 99%   BMI 38.63 kg/m  CONSTITUTIONAL: Alert and oriented and responds appropriately to questions.  Agitated but not in distress. HEAD: Normocephalic EYES: Conjunctivae clear, pupils appear equal, EOMI ENT: normal nose; moist mucous membranes NECK: Supple, no meningismus, no nuchal rigidity, no LAD  CARD: RRR; S1 and S2 appreciated; no murmurs, no clicks, no rubs, no gallops RESP: Normal chest excursion without splinting or tachypnea; breath sounds clear and equal bilaterally; no wheezes, no rhonchi, no rales, no hypoxia or respiratory distress, speaking full sentences ABD/GI: Normal bowel sounds; non-distended; soft, non-tender, no rebound, no guarding, no peritoneal signs, no hepatosplenomegaly BACK:  The back appears normal and is non-tender to palpation, there is no CVA tenderness EXT: Normal ROM in all joints; non-tender to palpation; no edema; normal capillary refill; no cyanosis, no calf tenderness or swelling; PICC line in the right upper extremity with no surrounding redness, warmth, drainage.  PICC appears in place. SKIN: Normal color for age and race; warm; no rash NEURO: Moves all extremities equally PSYCH: Agitated, cursing and raising his voice at staff.  MEDICAL DECISION MAKING: Patient here with PICC line dysfunction.  He is very upset about his wait time.  I have apologized to patient several times but he continues to be agitated, raising his voice, cursing.  Attempted to discuss plan of care the patient too agitated at this time.  ED PROGRESS: Came back to reassess patient once he was more calm.  Recommended chest x-ray to evaluate PICC line placement and will have IV team see the patient in the emergency department to see if they can flush his PICC line.  If they were unable to flush the line it may need to be replaced by the PICC line team.  It appears last time in the ED his PICC  line was placed by IR because the PICC line team could not get to it until later in the day.  His recent renal function on 06/14/2019 showed a creatinine of 0.93.  Will place a peripheral IV and give him his IV Ancef 2 g now.  Will allow patient to eat.  I do not feel he needs to be n.p.o. for a PICC line to be placed by IR or IV team.  Nursing staff has contacted supply distributor for wound VAC charger.  6:50 AM  IV team was able to successfully flush patient's PICC line.  He would like to be discharged so that he can go to court at 9 AM.  He can give himself his Ancef infusion at home at 8 AM as scheduled.   At this time, I do not feel  there is any life-threatening condition present. I have reviewed, interpreted and discussed all results (EKG, imaging, lab, urine as appropriate) and exam findings with patient/family. I have reviewed nursing notes and appropriate previous records.  I feel the patient is safe to be discharged home without further emergent workup and can continue workup as an outpatient as needed. Discussed usual and customary return precautions. Patient/family verbalize understanding and are comfortable with this plan.  Outpatient follow-up has been provided as needed. All questions have been answered.   Leticia Mcdiarmid was evaluated in Emergency Department on 06/23/2019 for the symptoms described in the history of present illness. He was evaluated in the context of the global COVID-19 pandemic, which necessitated consideration that the patient might be at risk for infection with the SARS-CoV-2 virus that causes COVID-19. Institutional protocols and algorithms that pertain to the evaluation of patients at risk for COVID-19 are in a state of rapid change based on information released by regulatory bodies including the CDC and federal and state organizations. These policies and algorithms were followed during the patient's care in the ED.    Reece Mcbroom, Delice Bison, DO 06/23/19 [phone]

## 2019-06-30 ENCOUNTER — Other Ambulatory Visit: Payer: Self-pay | Admitting: Family

## 2019-06-30 ENCOUNTER — Ambulatory Visit
Admission: RE | Admit: 2019-06-30 | Discharge: 2019-06-30 | Disposition: A | Discharge status: Home / Self Care | Payer: Self-pay | Source: Ambulatory Visit | Attending: Family | Admitting: Family

## 2019-06-30 ENCOUNTER — Telehealth: Payer: Self-pay | Admitting: *Deleted

## 2019-06-30 DIAGNOSIS — L03313 Cellulitis of chest wall: Secondary | ICD-10-CM

## 2019-06-30 NOTE — Progress Notes (Signed)
Received message from [facility] with concern for dislodgement of PICC line of about 5cm. Continues to remain functional at present. Will check placement with chest x-ray. Orders placed.

## 2019-06-30 NOTE — Telephone Encounter (Signed)
Home health nurse called for advice. She saw patient today for PICC dressing change, noted the PICC is out to 5cm.  Upon reviewing his chart after the visit, she saw that it was out only 2 cm at her visit 11/12 and that the circumference of his arm has gone from 31 cm -> 37 cm.  Home Health RN is paging Dr Baxter Flattery with this information, as patient has had multiple issues with his PICC since discharge. Landis Gandy, RN

## 2019-07-01 ENCOUNTER — Other Ambulatory Visit: Payer: Self-pay

## 2019-07-01 ENCOUNTER — Ambulatory Visit (INDEPENDENT_AMBULATORY_CARE_PROVIDER_SITE_OTHER): Payer: Self-pay | Admitting: Thoracic Surgery (Cardiothoracic Vascular Surgery)

## 2019-07-01 ENCOUNTER — Encounter: Payer: Self-pay | Admitting: Thoracic Surgery (Cardiothoracic Vascular Surgery)

## 2019-07-01 VITALS — BP 166/100 | HR 74 | Temp 97.9°F | Resp 20 | Ht 71.0 in | Wt 277.0 lb

## 2019-07-01 DIAGNOSIS — L02213 Cutaneous abscess of chest wall: Secondary | ICD-10-CM

## 2019-07-01 DIAGNOSIS — Z09 Encounter for follow-up examination after completed treatment for conditions other than malignant neoplasm: Secondary | ICD-10-CM

## 2019-07-01 NOTE — Progress Notes (Signed)
     [address]       [place] [address]             [phone]        Taylor Hughes presents for another wound check.  He is done well with his wound care. On exam the wound measures 10 cm in length and 1 and 1/2 cm in width.  The wound is completely filled and there is good granulation tissue.  I instructed him that he no longer requires a wound VAC and he will need to apply Vaseline gauze to the wound.  He will follow-up in 2 weeks for a wound check.

## 2019-07-04 ENCOUNTER — Other Ambulatory Visit: Payer: Self-pay | Admitting: Pharmacist

## 2019-07-04 MED ORDER — GLUCOSE BLOOD VI STRP: ORAL_STRIP | 11 refills | Status: DC

## 2019-07-04 MED FILL — TRUE METRIX TEST STRIP: 25 days supply | Qty: 100 | Fill #0

## 2019-07-11 ENCOUNTER — Telehealth: Payer: Self-pay

## 2019-07-11 MED FILL — GABAPENTIN 300 MG CAPSULE: 300 | 30 days supply | Qty: 60 | Fill #0

## 2019-07-11 MED FILL — LEVEMIR FLEXTOUCH 100 UNITS: 100 | 30 days supply | Qty: 6 | Fill #1

## 2019-07-11 NOTE — Telephone Encounter (Signed)
Debbie from [facility] called office today to inform MD patient is refusing to let home health do labs today. Jackelyn Poling would also like to know if picc can be pulled, states they do no have have an order. Last dose of antibiotics is 11/30

## 2019-07-12 NOTE — Telephone Encounter (Signed)
Debbie from nursing called to ask if the PICC can be removed as the patient states he is "Done".   Last dose was yesterday 07/11/19. And he refused labs at that time.

## 2019-07-13 ENCOUNTER — Other Ambulatory Visit: Payer: Self-pay | Admitting: Internal Medicine

## 2019-07-13 MED ORDER — CEPHALEXIN 500 MG PO CAPS: 500.0000 mg | ORAL_CAPSULE | Freq: Four times a day (QID) | ORAL | 1 refills | Status: DC

## 2019-07-13 MED FILL — ?CEPHALEXIN 500MG CAPSULE: 500 | 30 days supply | Qty: 120 | Fill #0

## 2019-07-13 NOTE — Telephone Encounter (Signed)
[facility] called office to follow up on pull picc order. Home health would like to know if Md would like labs drawn before picc is removed; since labs were not drawn Monday.  [place]

## 2019-07-13 NOTE — Telephone Encounter (Signed)
Per Dr. Baxter Flattery called [facility] with verbal order to pull picc. Also to collect a Sed Rate and CRP before picc line is removed. Spoke with Stanton Kidney who will give order to [facility] home health. [place]

## 2019-07-13 NOTE — Progress Notes (Signed)
Will pull picc line since completion of IV abtx. Transition to oral cephalexin 500mg  QID x 4 wk til we see him in clinic

## 2019-07-15 ENCOUNTER — Ambulatory Visit: Payer: Self-pay | Admitting: Thoracic Surgery (Cardiothoracic Vascular Surgery)

## 2019-07-19 ENCOUNTER — Ambulatory Visit: Payer: Self-pay | Admitting: Family Medicine

## 2019-07-22 ENCOUNTER — Encounter: Payer: Self-pay | Admitting: Thoracic Surgery (Cardiothoracic Vascular Surgery)

## 2019-07-22 ENCOUNTER — Ambulatory Visit: Payer: Self-pay | Admitting: Thoracic Surgery (Cardiothoracic Vascular Surgery)

## 2019-07-22 ENCOUNTER — Other Ambulatory Visit: Payer: Self-pay

## 2019-07-22 DIAGNOSIS — Z20822 Contact with and (suspected) exposure to covid-19: Secondary | ICD-10-CM

## 2019-07-25 ENCOUNTER — Inpatient Hospital Stay: Payer: Self-pay | Admitting: Internal Medicine

## 2019-07-25 LAB — NOVEL CORONAVIRUS, NAA: SARS-CoV-2, NAA: NOT DETECTED

## 2019-07-26 ENCOUNTER — Telehealth: Payer: Self-pay | Admitting: Family Medicine

## 2019-07-26 MED ORDER — GABAPENTIN 300 MG PO CAPS: 600.0000 mg | ORAL_CAPSULE | Freq: Two times a day (BID) | ORAL | 2 refills | Status: DC

## 2019-07-26 MED FILL — GABAPENTIN 300 MG CAPSULE: 300 | 30 days supply | Qty: 120 | Fill #0

## 2019-07-26 NOTE — Telephone Encounter (Signed)
Patient is requesting an increase in gabapentin he is taking more pills that prescribed.  Last fill was 07/11/2019 he has 15 days left to a refill is given.

## 2019-07-26 NOTE — Telephone Encounter (Signed)
Patient called stating that he is taken more gabapentin (NEURONTIN) 300 MG capsule [id number]   then normal for the leg pain wants more.

## 2019-07-26 NOTE — Telephone Encounter (Signed)
Patient has a number that is not working at this time.

## 2019-07-26 NOTE — Telephone Encounter (Signed)
Increased dose sent to his pharmacy.

## 2019-07-29 ENCOUNTER — Telehealth: Payer: Self-pay | Admitting: *Deleted

## 2019-07-29 NOTE — Telephone Encounter (Signed)
Called patient due to his request to rescheduled his missed appt with provider on 07-19-19. Staff was not able to reach patient and LMOM. Office number was provided on voicemail.

## 2019-08-01 MED FILL — LISINOPRIL-HCTZ 10-12.5 MG: 10-12.5 | 30 days supply | Qty: 30 | Fill #1

## 2019-08-01 MED FILL — TRUE METRIX TEST STRIP: 50 days supply | Qty: 200 | Fill #1

## 2019-08-01 MED FILL — !LEVEMIR FLEXPEN 100UNITS/M: 100U/ML (3) | 15 days supply | Qty: 3 | Fill #2

## 2019-08-08 ENCOUNTER — Inpatient Hospital Stay: Payer: Self-pay | Admitting: Family

## 2019-08-16 ENCOUNTER — Ambulatory Visit: Payer: Self-pay | Admitting: Thoracic Surgery (Cardiothoracic Vascular Surgery)

## 2019-08-18 MED FILL — !NOVOLOG FLEXPEN SYRINGE 1: 100/ML | 15 days supply | Qty: 3 | Fill #1

## 2019-08-18 MED FILL — !LEVEMIR FLEXPEN 100UNITS/M: 100U/ML (3) | 30 days supply | Qty: 30 | Fill #0

## 2019-08-19 ENCOUNTER — Ambulatory Visit: Payer: Self-pay | Admitting: Thoracic Surgery (Cardiothoracic Vascular Surgery)

## 2019-08-30 MED FILL — !NOVOLOG FLEXPEN SYRINGE 1: 100/ML | 30 days supply | Qty: 6 | Fill #2

## 2019-08-30 MED FILL — GABAPENTIN 300 MG CAPSULE: 300 | 30 days supply | Qty: 120 | Fill #1

## 2019-09-19 MED FILL — LISINOPRIL-HCTZ 10-12.5 MG: 10-12.5 | 30 days supply | Qty: 30 | Fill #2

## 2019-09-26 MED FILL — !NOVOLOG FLEXPEN SYRINGE 1: 100/ML | 30 days supply | Qty: 6 | Fill #3

## 2019-10-07 ENCOUNTER — Ambulatory Visit: Payer: Self-pay | Admitting: Thoracic Surgery (Cardiothoracic Vascular Surgery)

## 2019-10-10 ENCOUNTER — Encounter: Payer: Self-pay | Admitting: Thoracic Surgery (Cardiothoracic Vascular Surgery)

## 2019-10-12 ENCOUNTER — Ambulatory Visit: Payer: Self-pay | Admitting: Family Medicine

## 2019-10-21 MED FILL — GABAPENTIN 300 MG CAPSULE: 300 | 30 days supply | Qty: 120 | Fill #2

## 2019-10-21 MED FILL — LISINOPRIL-HCTZ 10-12.5 MG: 10-12.5 | 30 days supply | Qty: 30 | Fill #3

## 2019-11-02 MED FILL — !NOVOLOG FLEXPEN SYRINGE 1: 100/ML | 30 days supply | Qty: 6 | Fill #4

## 2019-11-12 ENCOUNTER — Emergency Department (HOSPITAL_COMMUNITY)
Admission: EM | Admit: 2019-11-12 | Discharge: 2019-11-12 | Disposition: A | Discharge status: Home / Self Care | Payer: Self-pay | Attending: Emergency Medicine | Admitting: Emergency Medicine

## 2019-11-12 DIAGNOSIS — Z794 Long term (current) use of insulin: Secondary | ICD-10-CM | POA: Insufficient documentation

## 2019-11-12 DIAGNOSIS — Z79899 Other long term (current) drug therapy: Secondary | ICD-10-CM | POA: Insufficient documentation

## 2019-11-12 DIAGNOSIS — K047 Periapical abscess without sinus: Secondary | ICD-10-CM | POA: Insufficient documentation

## 2019-11-12 DIAGNOSIS — I4891 Unspecified atrial fibrillation: Secondary | ICD-10-CM | POA: Insufficient documentation

## 2019-11-12 DIAGNOSIS — I10 Essential (primary) hypertension: Secondary | ICD-10-CM | POA: Insufficient documentation

## 2019-11-12 DIAGNOSIS — E119 Type 2 diabetes mellitus without complications: Secondary | ICD-10-CM | POA: Insufficient documentation

## 2019-11-12 LAB — CBG MONITORING, ED: Glucose-Capillary: 421 mg/dL — ABNORMAL HIGH (ref 70–99)

## 2019-11-12 MED ORDER — LIDOCAINE-EPINEPHRINE (PF) 2 %-1:200000 IJ SOLN
1.7000 mL | Freq: Once | INTRAMUSCULAR | Status: AC
  Administered 2019-11-12: 1.7 mL
  Filled 2019-11-12: qty 20

## 2019-11-12 MED ORDER — HYDROCODONE-ACETAMINOPHEN 5-325 MG PO TABS
1.0000 | ORAL_TABLET | Freq: Once | ORAL | Status: AC
  Administered 2019-11-12: 1 via ORAL
  Filled 2019-11-12: qty 1

## 2019-11-12 MED ORDER — HYDROCODONE-ACETAMINOPHEN 5-325 MG PO TABS: ORAL_TABLET | ORAL | 0 refills | Status: DC

## 2019-11-12 MED ORDER — PENICILLIN V POTASSIUM 500 MG PO TABS: 500.0000 mg | ORAL_TABLET | Freq: Three times a day (TID) | ORAL | 0 refills | Status: DC

## 2019-11-12 MED ORDER — PENICILLIN V POTASSIUM 250 MG PO TABS
500.0000 mg | ORAL_TABLET | Freq: Once | ORAL | Status: AC
  Administered 2019-11-12: 500 mg via ORAL
  Filled 2019-11-12: qty 2

## 2019-11-12 MED ORDER — BUPIVACAINE-EPINEPHRINE (PF) 0.5% -1:200000 IJ SOLN
1.8000 mL | Freq: Once | INTRAMUSCULAR | Status: AC
  Administered 2019-11-12: 1.8 mL
  Filled 2019-11-12: qty 1.8

## 2019-11-12 NOTE — Discharge Instructions (Signed)
Please read and follow all provided instructions.  Your diagnoses today include:  1. Dental infection     The exam and treatment you received today has been provided on an emergency basis only. This is not a substitute for complete medical or dental care.  Tests performed today include:  Vital signs. See below for your results today.   Medications prescribed:   Vicodin (hydrocodone/acetaminophen) - narcotic pain medication  DO NOT drive or perform any activities that require you to be awake and alert because this medicine can make you drowsy. BE VERY CAREFUL not to take multiple medicines containing Tylenol (also called acetaminophen). Doing so can lead to an overdose which can damage your liver and cause liver failure and possibly death.   Penicillin - antibiotic  You have been prescribed an antibiotic medicine: take the entire course of medicine even if you are feeling better. Stopping early can cause the antibiotic not to work.  Take any prescribed medications only as directed.  Home care instructions:  Follow any educational materials contained in this packet.  Follow-up instructions: Please follow-up with your dentist for further evaluation of your symptoms.   Dental Assistance: See attached dental referrals  Return instructions:   Please return to the Emergency Department if you experience worsening symptoms.  Please return if you develop a fever, you develop more swelling in your face or neck, you have trouble breathing or swallowing food.  Please return if you have any other emergent concerns.  Additional Information:  Your vital signs today were: BP (!) 140/100 (BP Location: Right Arm)   Pulse (!) 139   Temp 97.7 F (36.5 C) (Oral)   Resp 20   SpO2 97%  If your blood pressure (BP) was elevated above 135/85 this visit, please have this repeated by your doctor within one month. --------------

## 2019-11-12 NOTE — ED Triage Notes (Signed)
Pt with left lower tooth pain. States he has a piece of tooth missing which has caused ongoing pain for the past 2 days. Has been using salt water and antibacterial wash at home with no effect.

## 2019-11-12 NOTE — ED Provider Notes (Addendum)
[facility] EMERGENCY DEPARTMENT Provider Note   CSN: [id number] Arrival date & time: 11/12/19  1642     History Chief Complaint  Patient presents with  . Dental Pain    Taylor Hughes is a 49 y.o. male.  Patient with history of diabetes, atrial fibrillation --presents the emergency department with severe left lower dental pain over the past 2 days.  Patient states that the pain has been so bad he has not been able to sleep.  No facial swelling.  No fevers.  He has been rinsing with salt water and antibacterial solution at home without any improvement.  States that the tooth at this location broke a month or so ago when he has residual tooth left in his gums.        Past Medical History:  Diagnosis Date  . Atrial fibrillation ([place])   . Diabetes mellitus without complication (HCC)    Type 2  . GERD (gastroesophageal reflux disease)   . Wears glasses   . Wound, open, foot    left diabetic     Patient Active Problem List   Diagnosis Date Noted  . Sepsis ([place]) 05/24/2019  . Cellulitis of chest wall   . Venous insufficiency (chronic) (peripheral) 03/15/2019  . Edema of both lower extremities due to peripheral venous insufficiency 03/15/2019  . Hypoglycemia due to insulin 12/18/2018  . Acute encephalopathy 12/17/2018  . Neck abscess 11/23/2018  . Essential hypertension 11/23/2018  . AKI (acute kidney injury) ([place]) 11/23/2018  . Obesity (BMI 30.0-34.9) 11/23/2018  . Type 2 diabetes mellitus with other specified complication ([place]) 99/83/3825  . Sepsis due to group B Streptococcus ([place])   . Diabetic infection of left foot ([place])   . Left foot infection   . DKA, type 2 ([place]) 07/28/2018  . Leukocytosis 07/28/2018  . Hyperkalemia 07/28/2018  . Hyponatremia 07/28/2018    Past Surgical History:  Procedure Laterality Date  . 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 APPLICATION OF A-CELL OF CHEST/ABDOMEN N/A 05/30/2019   Procedure: Application Of A-Cell Of Chest;  Surgeon: Lajuana Matte, MD;  Location: [facility];  Service: Cardiothoracic;  Laterality: N/A;  . APPLICATION OF WOUND VAC N/A 05/30/2019   Procedure: Application Of Wound Vac;  Surgeon: Lajuana Matte, MD;  Location: [facility];  Service: Cardiothoracic;  Laterality: N/A;  . FINGER SURGERY Right 2016   I&D  small finger  . I & D EXTREMITY Left 07/30/2018   Procedure: IRRIGATION AND DEBRIDEMENT LEFT FOOT WITH POSSIBLE AMPUTATION OF FIFTH TOE;  Surgeon: Mcarthur Rossetti, MD;  Location: [facility];  Service: Orthopedics;  Laterality: Left;  . INCISION AND DRAINAGE ABSCESS Left 01/19/2019   Procedure: INCISION AND DRAINAGE ABSCESS upper chest;  Surgeon: Melida Quitter, MD;  Location: [facility];  Service: ENT;  Laterality: Left;  . IRRIGATION AND DEBRIDEMENT STERNOCLAVICULAR JOINT-STERNUM AND RIBS N/A 05/30/2019   Procedure: IRRIGATION AND DEBRIDEMENT OF STERNOCLAVICULAR JOINT-STERNUM AND RIBS ;  Surgeon: Lajuana Matte, MD;  Location: [facility];  Service: Cardiothoracic;  Laterality: N/A;  . MINOR IRRIGATION AND DEBRIDEMENT OF WOUND N/A 11/22/2018   Procedure: INCISION AND DRAINAGE OF NECK ABSCESS;  Surgeon: Melida Quitter, MD;  Location[facility];  Service: ENT;  Laterality: N/A;       Family History  Problem Relation Age of Onset  . Diabetes Mother  Social History   Tobacco Use  . Smoking status: Never Smoker  . Smokeless tobacco: Never Used  Substance Use Topics  . Alcohol use: No    Alcohol/week: 0.0 standard drinks  . Drug use: No    Home Medications Prior to Admission medications   Medication Sig Start Date End Date Taking? Authorizing Provider  blood glucose meter kit and supplies KIT Dispense based on patient and insurance preference. Use up to four times daily as directed. (FOR ICD-9 250.00, 250.01). 06/07/19   Kayleen Memos, DO  cephALEXin (KEFLEX) 500 MG capsule Take 1 capsule (500 mg total) by mouth 4 (four) times daily. 07/13/19   Carlyle Basques, MD  gabapentin (NEURONTIN) 300 MG capsule Take 2 capsules (600 mg total) by mouth 2 (two) times daily. 07/26/19   Charlott Rakes, MD  glucose blood test strip Use as instructed to check blood sugar up to 4-5 times daily. 07/04/19   Charlott Rakes, MD  HYDROcodone-acetaminophen (NORCO/VICODIN) 5-325 MG tablet Take 1 tablet by mouth daily as needed for severe pain. 06/07/19   Kayleen Memos, DO  insulin aspart (NOVOLOG) 100 UNIT/ML FlexPen Inject 7 Units into the skin 3 (three) times daily with meals. 06/21/19   Charlott Rakes, MD  Insulin Detemir (LEVEMIR) 100 UNIT/ML Pen Inject 20 Units into the skin daily. 06/21/19   Charlott Rakes, MD  Insulin Pen Needle 32G X 5 MM MISC Use with levemir pen as prescribed 11/29/18   [place] Donnamarie Poag, DO  lisinopril-hydrochlorothiazide (ZESTORETIC) 10-12.5 MG tablet Take 1 tablet by mouth daily. 06/21/19   Charlott Rakes, MD  metoprolol tartrate (LOPRESSOR) 50 MG tablet Take 1 tablet (50 mg total) by mouth 2 (two) times daily. 06/21/19 07/21/19  Charlott Rakes, MD  Multiple Vitamin (MULTIVITAMIN WITH MINERALS) TABS tablet Take 1 tablet by mouth daily. 06/07/19   Kayleen Memos, DO  TRUEplus Lancets 28G MISC Use to check blood sugar daily. 05/11/19   Charlott Rakes, MD    Allergies    Patient has no known allergies.  Review of Systems   Review of Systems  Constitutional: Negative for fever.  HENT: Positive for dental problem. Negative for ear pain, facial swelling, sore throat and trouble swallowing.   Respiratory: Negative for shortness of breath and stridor.   Musculoskeletal: Negative for neck pain.  Skin: Negative for color change.  Neurological: Negative for headaches.    Physical Exam Updated Vital Signs BP (!) 140/100 (BP Location: Right Arm)   Pulse (!) 139   Temp 97.7 F (36.5 C) (Oral)   Resp 20   SpO2  97%   Physical Exam Vitals and nursing note reviewed.  Constitutional:      Appearance: He is well-developed.  HENT:     Head: Normocephalic and atraumatic.     Jaw: No trismus.     Right Ear: Tympanic membrane, ear canal and external ear normal.     Left Ear: Tympanic membrane, ear canal and external ear normal.     Nose: Nose normal.     Mouth/Throat:     Dentition: Abnormal dentition. Dental caries present. No dental abscesses.     Pharynx: Uvula midline. No uvula swelling.     Tonsils: No tonsillar abscesses.     Comments: Multiple missing teeth.  Tooth #20 is broken down to the gumline.  Erythema of the gumline in this area noted.  No abscess. Eyes:     Pupils: Pupils are equal, round, and reactive to light.  Neck:  Comments: No neck swelling or Lugwig's angina Musculoskeletal:     Cervical back: Normal range of motion and neck supple.  Skin:    General: Skin is warm and dry.  Neurological:     Mental Status: He is alert.     ED Results / Procedures / Treatments   Labs (all labs ordered are listed, but only abnormal results are displayed) Labs Reviewed  CBG MONITORING, ED - Abnormal; Notable for the following components:      Result Value   Glucose-Capillary 421 (*)    All other components within normal limits    EKG None  Radiology No results found.  Procedures .Nerve Block  Date/Time: 11/12/2019 5:27 PM Performed by: Carlisle Cater, PA-C Authorized by: Carlisle Cater, PA-C   Consent:    Consent obtained:  Verbal   Consent given by:  Patient   Risks discussed:  Pain, infection, bleeding, swelling and unsuccessful block   Alternatives discussed:  No treatment Indications:    Indications:  Pain relief Location:    Nerve block body site: L lower tooth #20.   Laterality:  Left Procedure details (see MAR for exact dosages):    Block needle gauge:  27 G   Anesthetic injected:  Bupivacaine 0.25% WITH epi and lidocaine 2% WITH epi   Injection  procedure:  Anatomic landmarks identified, incremental injection, negative aspiration for blood, anatomic landmarks palpated and introduced needle Post-procedure details:    Dressing:  None   Outcome:  Pain relieved   Patient tolerance of procedure:  Tolerated well, no immediate complications   (including critical care time)  Medications Ordered in ED Medications  lidocaine-EPINEPHrine (XYLOCAINE W/EPI) 2 %-1:200000 (PF) injection 1.7 mL (1.7 mLs Infiltration Given 11/12/19 1706)  bupivacaine-epinephrine (MARCAINE W/ EPI) 0.5% -1:200000 injection 1.8 mL (1.8 mLs Infiltration Given 11/12/19 1706)  HYDROcodone-acetaminophen (NORCO/VICODIN) 5-325 MG per tablet 1 tablet (1 tablet Oral Given 11/12/19 1705)  penicillin v potassium (VEETID) tablet 500 mg (500 mg Oral Given 11/12/19 1705)    ED Course  I have reviewed the triage vital signs and the nursing notes.  Pertinent labs & imaging results that were available during my care of the patient were reviewed by me and considered in my medical decision making (see chart for details).  5:25 PM Patient seen and examined. Discussed dental block procedure with patient and he agrees to proceed. Penicillin and hydrocodone ordered. Tachycardia 2/2 pain. CBG elevated, patient took insulin an hr ago. No clinical DKA (vomiting, abd pain, ALOC).  Vital signs reviewed and are as follows: BP (!) 140/100 (BP Location: Right Arm)   Pulse (!) 139   Temp 97.7 F (36.5 C) (Oral)   Resp 20   SpO2 97%    Dental block performed with good results.   Patient counseled on use of narcotic pain medications. Counseled not to combine these medications with others containing tylenol. Urged not to drink alcohol, drive, or perform any other activities that requires focus while taking these medications. The patient verbalizes understanding and agrees with the plan.  Patient counseled to take prescribed medications as directed, return with worsening facial or neck swelling, and  to follow-up with their dentist as soon as possible.      MDM Rules/Calculators/A&P                      Patient with toothache. No fever. Exam unconcerning for Ludwig's angina or other deep tissue infection in neck.   Will treat with antibiotic and pain  medicine. Urged patient to follow-up with dentist.    Tachycardia on arrival 2/2 pain and discomfort. Elevated CBG -- pt treated at home PTA. Do not suspect DKA.   Final Clinical Impression(s) / ED Diagnoses Final diagnoses:  Dental infection    Rx / DC Orders ED Discharge Orders         Ordered    penicillin v potassium (VEETID) 500 MG tablet  3 times daily     11/12/19 1723    HYDROcodone-acetaminophen (NORCO/VICODIN) 5-325 MG tablet     11/12/19 1723            Carlisle Cater, PA-C 11/12/19 1730    Virgel Manifold, MD 11/12/19 1818

## 2019-11-14 ENCOUNTER — Other Ambulatory Visit: Payer: Self-pay | Admitting: Family Medicine

## 2019-11-15 MED FILL — LISINOPRIL-HCTZ 10-12.5 MG: 10-12.5 | 30 days supply | Qty: 30 | Fill #0

## 2019-12-12 ENCOUNTER — Other Ambulatory Visit: Payer: Self-pay | Admitting: Family Medicine

## 2019-12-12 MED FILL — TRUE METRIX TEST STRIP: 50 days supply | Qty: 200 | Fill #2

## 2019-12-12 MED FILL — NOVOLOG FLEXPEN SYRINGE: 100 | 30 days supply | Qty: 6 | Fill #5

## 2019-12-12 MED FILL — GABAPENTIN 300 MG CAPSULE: 300 | 30 days supply | Qty: 60 | Fill #1

## 2019-12-20 ENCOUNTER — Encounter (HOSPITAL_COMMUNITY): Payer: Self-pay

## 2019-12-20 ENCOUNTER — Emergency Department (HOSPITAL_COMMUNITY)
Admission: EM | Admit: 2019-12-20 | Discharge: 2019-12-20 | Disposition: A | Discharge status: Home / Self Care | Payer: Self-pay | Attending: Emergency Medicine | Admitting: Emergency Medicine

## 2019-12-20 ENCOUNTER — Other Ambulatory Visit: Payer: Self-pay

## 2019-12-20 DIAGNOSIS — I10 Essential (primary) hypertension: Secondary | ICD-10-CM | POA: Insufficient documentation

## 2019-12-20 DIAGNOSIS — K0889 Other specified disorders of teeth and supporting structures: Secondary | ICD-10-CM | POA: Insufficient documentation

## 2019-12-20 DIAGNOSIS — E119 Type 2 diabetes mellitus without complications: Secondary | ICD-10-CM | POA: Insufficient documentation

## 2019-12-20 DIAGNOSIS — Z79899 Other long term (current) drug therapy: Secondary | ICD-10-CM | POA: Insufficient documentation

## 2019-12-20 DIAGNOSIS — Z794 Long term (current) use of insulin: Secondary | ICD-10-CM | POA: Insufficient documentation

## 2019-12-20 MED ORDER — BUPIVACAINE-EPINEPHRINE (PF) 0.5% -1:200000 IJ SOLN
1.8000 mL | Freq: Once | INTRAMUSCULAR | Status: AC
  Administered 2019-12-20: 1.8 mL
  Filled 2019-12-20: qty 1.8

## 2019-12-20 MED ORDER — IBUPROFEN 800 MG PO TABS
800.0000 mg | ORAL_TABLET | Freq: Three times a day (TID) | ORAL | 0 refills | Status: DC
Start: 2019-12-20 — End: 2021-06-07

## 2019-12-20 MED ORDER — OXYCODONE-ACETAMINOPHEN 5-325 MG PO TABS
2.0000 | ORAL_TABLET | Freq: Once | ORAL | Status: AC
  Administered 2019-12-20: 2 via ORAL
  Filled 2019-12-20: qty 2

## 2019-12-20 MED ORDER — KETOROLAC TROMETHAMINE 60 MG/2ML IM SOLN
60.0000 mg | Freq: Once | INTRAMUSCULAR | Status: AC
  Administered 2019-12-20: 60 mg via INTRAMUSCULAR
  Filled 2019-12-20: qty 2

## 2019-12-20 NOTE — ED Triage Notes (Signed)
Pt c/o lower left sided dental pain and headache.

## 2019-12-20 NOTE — ED Provider Notes (Signed)
[facility] DEPT Provider Note   CSN: [id number] Arrival date & time: 12/20/19  0201     History Chief Complaint  Patient presents with  . Dental Pain    Taylor Hughes is a 49 y.o. male.   Dental Pain Location:  Lower Lower teeth location:  17/LL 3rd molar Quality:  Aching and dull Severity:  Mild Duration:  2 weeks Timing:  Constant Progression:  Worsening Chronicity:  Recurrent Context: dental fracture   Context: not abscess   Relieved by:  Nothing Ineffective treatments:  Acetaminophen Associated symptoms: gum swelling   Associated symptoms: no congestion and no fever        Past Medical History:  Diagnosis Date  . Atrial fibrillation ([place])   . Diabetes mellitus without complication (HCC)    Type 2  . GERD (gastroesophageal reflux disease)   . Wears glasses   . Wound, open, foot    left diabetic     Patient Active Problem List   Diagnosis Date Noted  . Sepsis ([place]) 05/24/2019  . Cellulitis of chest wall   . Venous insufficiency (chronic) (peripheral) 03/15/2019  . Edema of both lower extremities due to peripheral venous insufficiency 03/15/2019  . Hypoglycemia due to insulin 12/18/2018  . Acute encephalopathy 12/17/2018  . Neck abscess 11/23/2018  . Essential hypertension 11/23/2018  . AKI (acute kidney injury) ([place]) 11/23/2018  . Obesity (BMI 30.0-34.9) 11/23/2018  . Type 2 diabetes mellitus with other specified complication ([place]) 56/38/7564  . Sepsis due to group B Streptococcus ([place])   . Diabetic infection of left foot ([place])   . Left foot infection   . DKA, type 2 ([place]) 07/28/2018  . Leukocytosis 07/28/2018  . Hyperkalemia 07/28/2018  . Hyponatremia 07/28/2018    Past Surgical History:  Procedure Laterality Date  . AMPUTATION Left 08/20/2018   Procedure: LEFT FOOT IRRIGATON AND DEBRIDEMENT, 5TH RAY AMPUTATION;  Surgeon: Newt Minion, MD;  Location: [facility];  Service: Orthopedics;  Laterality: Left;  .  APPLICATION OF A-CELL OF CHEST/ABDOMEN N/A 05/30/2019   Procedure: Application Of A-Cell Of Chest;  Surgeon: Lajuana Matte, MD;  Location: [facility];  Service: Cardiothoracic;  Laterality: N/A;  . APPLICATION OF WOUND VAC N/A 05/30/2019   Procedure: Application Of Wound Vac;  Surgeon: Lajuana Matte, MD;  Location: [facility];  Service: Cardiothoracic;  Laterality: N/A;  . FINGER SURGERY Right 2016   I&D  small finger  . I & D EXTREMITY Left 07/30/2018   Procedure: IRRIGATION AND DEBRIDEMENT LEFT FOOT WITH POSSIBLE AMPUTATION OF FIFTH TOE;  Surgeon: Mcarthur Rossetti, MD;  [facility];  Service: Orthopedics;  Laterality: Left;  . INCISION AND DRAINAGE ABSCESS Left 01/19/2019   Procedure: INCISION AND DRAINAGE ABSCESS upper chest;  Surgeon: Melida Quitter, MD;  Location: [facility];  Service: ENT;  Laterality: Left;  . IRRIGATION AND DEBRIDEMENT STERNOCLAVICULAR JOINT-STERNUM AND RIBS N/A 05/30/2019   Procedure: IRRIGATION AND DEBRIDEMENT OF STERNOCLAVICULAR JOINT-STERNUM AND RIBS ;  Surgeon: Lajuana Matte, MD;  Location: [facility];  Service: Cardiothoracic;  Laterality: N/A;  . MINOR IRRIGATION AND DEBRIDEMENT OF WOUND N/A 11/22/2018   Procedure: INCISION AND DRAINAGE OF NECK ABSCESS;  Surgeon: Melida Quitter, MD;  [facility];  Service: ENT;  Laterality: N/A;       Family History  Problem Relation Age of Onset  . Diabetes Mother     Social History   Tobacco Use  . Smoking status: Never Smoker  . Smokeless tobacco: Never  Used  Substance Use Topics  . Alcohol use: No    Alcohol/week: 0.0 standard drinks  . Drug use: No    Home Medications Prior to Admission medications   Medication Sig Start Date End Date Taking? Authorizing Provider  blood glucose meter kit and supplies KIT Dispense based on patient and insurance preference. Use up to four times daily as directed. (FOR ICD-9 250.00, 250.01). 06/07/19   Kayleen Memos, DO  cephALEXin (KEFLEX) 500 MG capsule Take 1  capsule (500 mg total) by mouth 4 (four) times daily. 07/13/19   Carlyle Basques, MD  gabapentin (NEURONTIN) 300 MG capsule Take 2 capsules (600 mg total) by mouth 2 (two) times daily. 07/26/19   Charlott Rakes, MD  glucose blood test strip Use as instructed to check blood sugar up to 4-5 times daily. 07/04/19   Charlott Rakes, MD  HYDROcodone-acetaminophen (NORCO/VICODIN) 5-325 MG tablet Take 1-2 tablets every 6 hours as needed for severe pain 11/12/19   Carlisle Cater, PA-C  ibuprofen (ADVIL) 800 MG tablet Take 1 tablet (800 mg total) by mouth 3 (three) times daily. 12/20/19   Jagar Lua, Corene Cornea, MD  insulin aspart (NOVOLOG) 100 UNIT/ML FlexPen Inject 7 Units into the skin 3 (three) times daily with meals. 06/21/19   Charlott Rakes, MD  Insulin Detemir (LEVEMIR) 100 UNIT/ML Pen Inject 20 Units into the skin daily. 06/21/19   Charlott Rakes, MD  Insulin Pen Needle 32G X 5 MM MISC Use with levemir pen as prescribed 11/29/18   [place] Donnamarie Poag, DO  lisinopril-hydrochlorothiazide (ZESTORETIC) 10-12.5 MG tablet Take 1 tablet by mouth daily. Please make and keep appointment. 11/15/19   Charlott Rakes, MD  metoprolol tartrate (LOPRESSOR) 50 MG tablet Take 1 tablet (50 mg total) by mouth 2 (two) times daily. 06/21/19 07/21/19  Charlott Rakes, MD  Multiple Vitamin (MULTIVITAMIN WITH MINERALS) TABS tablet Take 1 tablet by mouth daily. 06/07/19   Kayleen Memos, DO  penicillin v potassium (VEETID) 500 MG tablet Take 1 tablet (500 mg total) by mouth 3 (three) times daily. 11/12/19   Carlisle Cater, PA-C  TRUEplus Lancets 28G MISC Use to check blood sugar daily. 05/11/19   Charlott Rakes, MD    Allergies    Patient has no known allergies.  Review of Systems   Review of Systems  Constitutional: Negative for fever.  HENT: Negative for congestion.   All other systems reviewed and are negative.   Physical Exam Updated Vital Signs BP (!) 168/98 (BP Location: Left Arm)   Pulse 87   Temp 98.5 F (36.9 C) (Oral)    Resp 16   Ht 5' 11"  (1.803 m)   Wt 124.7 kg   SpO2 99%   BMI 38.35 kg/m   Physical Exam Vitals and nursing note reviewed.  Constitutional:      Appearance: He is well-developed.  HENT:     Head: Normocephalic and atraumatic.     Nose: No congestion or rhinorrhea.     Mouth/Throat:     Pharynx: Posterior oropharyngeal erythema (and mild edema of left lower gumline) present.  Cardiovascular:     Rate and Rhythm: Normal rate.  Pulmonary:     Effort: Pulmonary effort is normal. No respiratory distress.  Abdominal:     General: There is no distension.  Musculoskeletal:        General: Normal range of motion.     Cervical back: Normal range of motion.  Skin:    General: Skin is warm and dry.  Neurological:  General: No focal deficit present.     Mental Status: He is alert.     ED Results / Procedures / Treatments   Labs (all labs ordered are listed, but only abnormal results are displayed) Labs Reviewed - No data to display  EKG None  Radiology No results found.  Procedures Dental Block  Date/Time: 12/20/2019 5:11 AM Performed by: Merrily Pew, MD Authorized by: Merrily Pew, MD   Consent:    Consent obtained:  Verbal   Consent given by:  Patient   Risks discussed:  Allergic reaction, hematoma, infection, intravascular injection, pain, nerve damage, unsuccessful block and swelling   Alternatives discussed:  Delayed treatment, alternative treatment, referral and no treatment Indications:    Indications: dental pain   Location:    Block type:  Supraperiosteal   Supraperiosteal location:  Lower teeth   Lower teeth location:  17/LL 3rd molar Procedure details (see MAR for exact dosages):    Topical anesthetic:  Benzocaine gel   Syringe type:  Aspirating dental syringe   Needle gauge:  24 G   Anesthetic injected:  Bupivacaine 0.5% WITH epi   Injection procedure:  Anatomic landmarks identified, introduced needle, incremental injection, anatomic landmarks  palpated and negative aspiration for blood Post-procedure details:    Outcome:  Pain relieved   Patient tolerance of procedure:  Tolerated well, no immediate complications   (including critical care time)  Medications Ordered in ED Medications  ketorolac (TORADOL) injection 60 mg (60 mg Intramuscular Given 12/20/19 0401)  oxyCODONE-acetaminophen (PERCOCET/ROXICET) 5-325 MG per tablet 2 tablet (2 tablets Oral Given 12/20/19 0400)  bupivacaine-epinephrine (MARCAINE W/ EPI) 0.5% -1:200000 injection 1.8 mL (1.8 mLs Infiltration Given by Other 12/20/19 0401)    ED Course  I have reviewed the triage vital signs and the nursing notes.  Pertinent labs & imaging results that were available during my care of the patient were reviewed by me and considered in my medical decision making (see chart for details).    MDM Rules/Calculators/A&P                      Dental pain without obvious sign of infection.   Final Clinical Impression(s) / ED Diagnoses Final diagnoses:  Pain, dental    Rx / DC Orders ED Discharge Orders         Ordered    ibuprofen (ADVIL) 800 MG tablet  3 times daily     12/20/19 0407           Aleese Kamps, Corene Cornea, MD 12/20/19 [phone]

## 2020-01-03 ENCOUNTER — Other Ambulatory Visit: Payer: Self-pay | Admitting: Family Medicine

## 2020-01-03 MED FILL — METOPROLOL TARTRATE 50 MG T: 50 | 30 days supply | Qty: 60 | Fill #0

## 2020-01-03 MED FILL — LEVEMIR FLEXTOUCH 100 UNITS: 100 | 40 days supply | Qty: 6 | Fill #1

## 2020-05-04 IMAGING — DX DG FOOT COMPLETE 3+V*L*
3 series · 3 of 3 positions shown · non-contrast
Comparison: None.

CLINICAL DATA: 47-year-old male with an open wound to the lateral
aspect of the left foot

EXAM:
LEFT FOOT - COMPLETE 3+ VIEW

[foot ap]
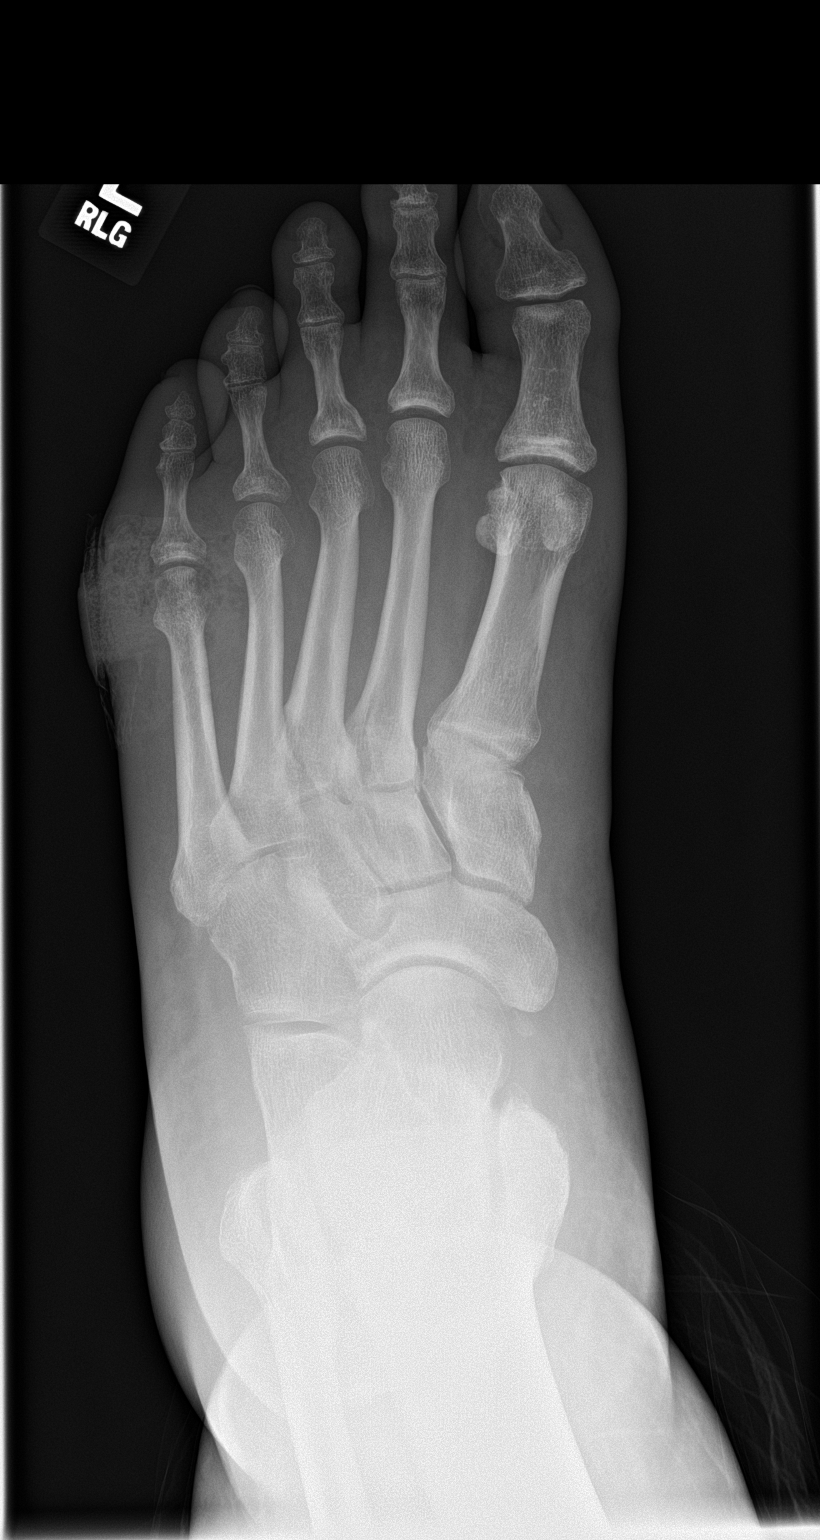

[foot obl]
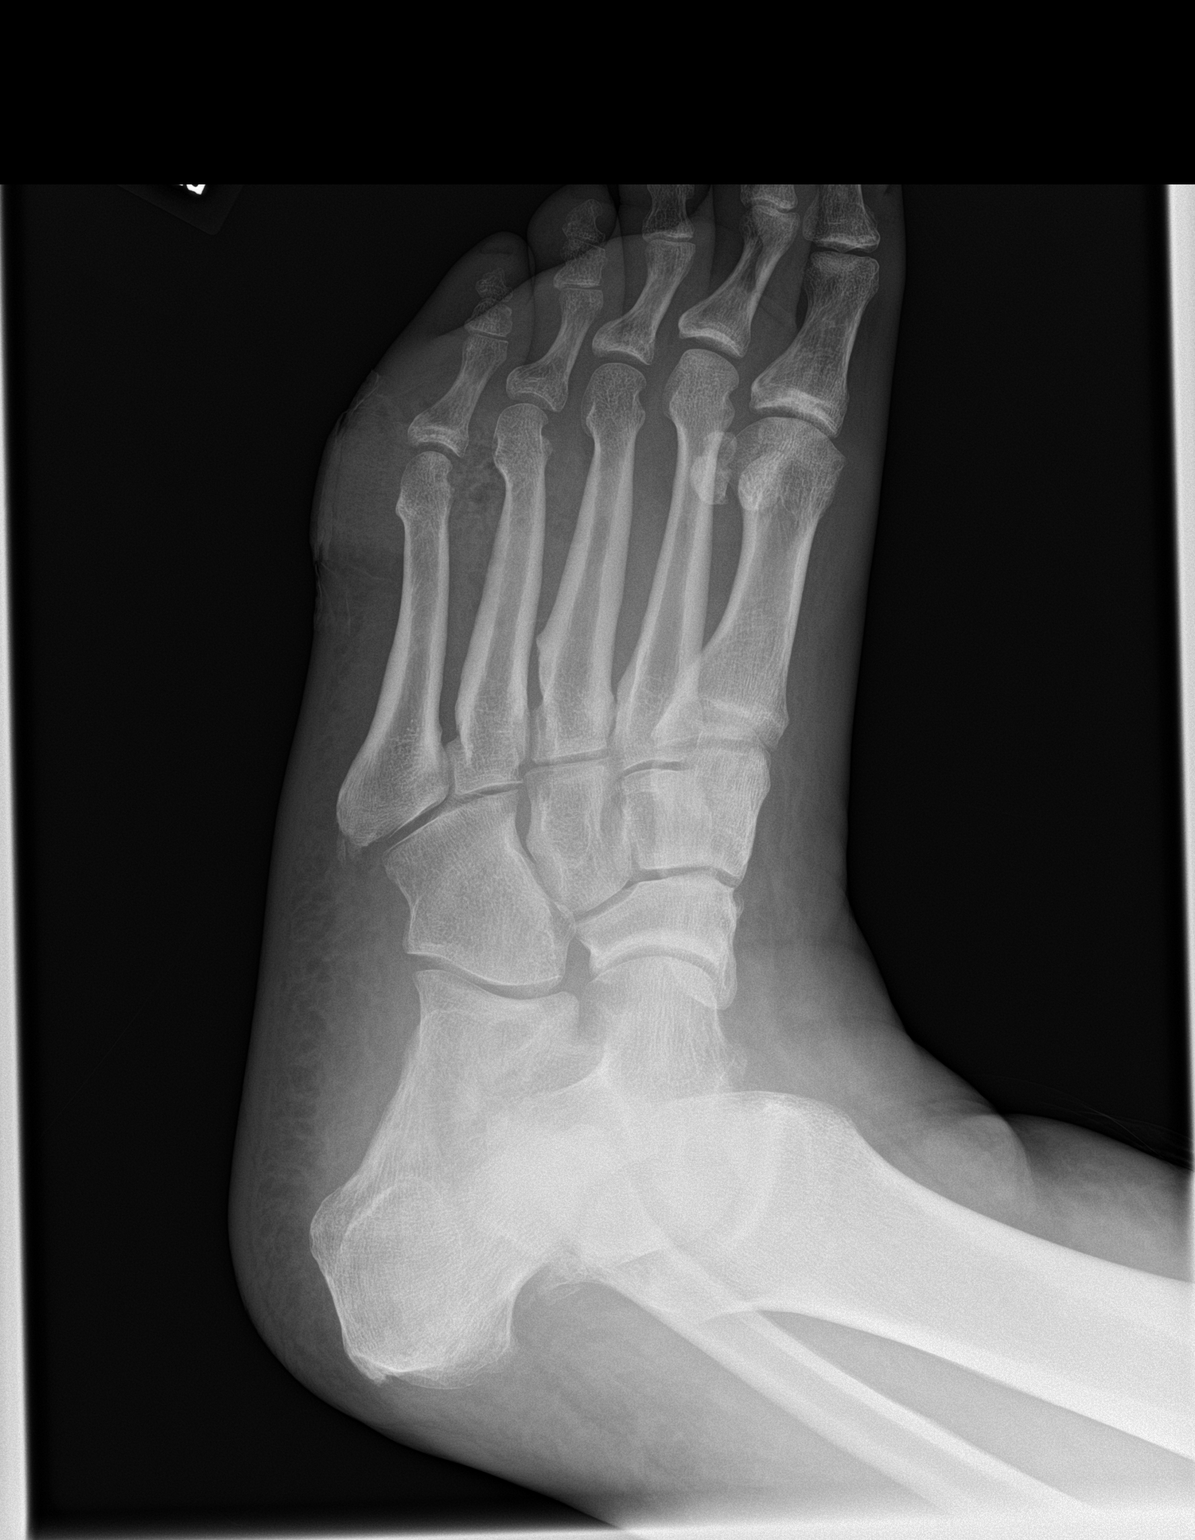

[foot lat]
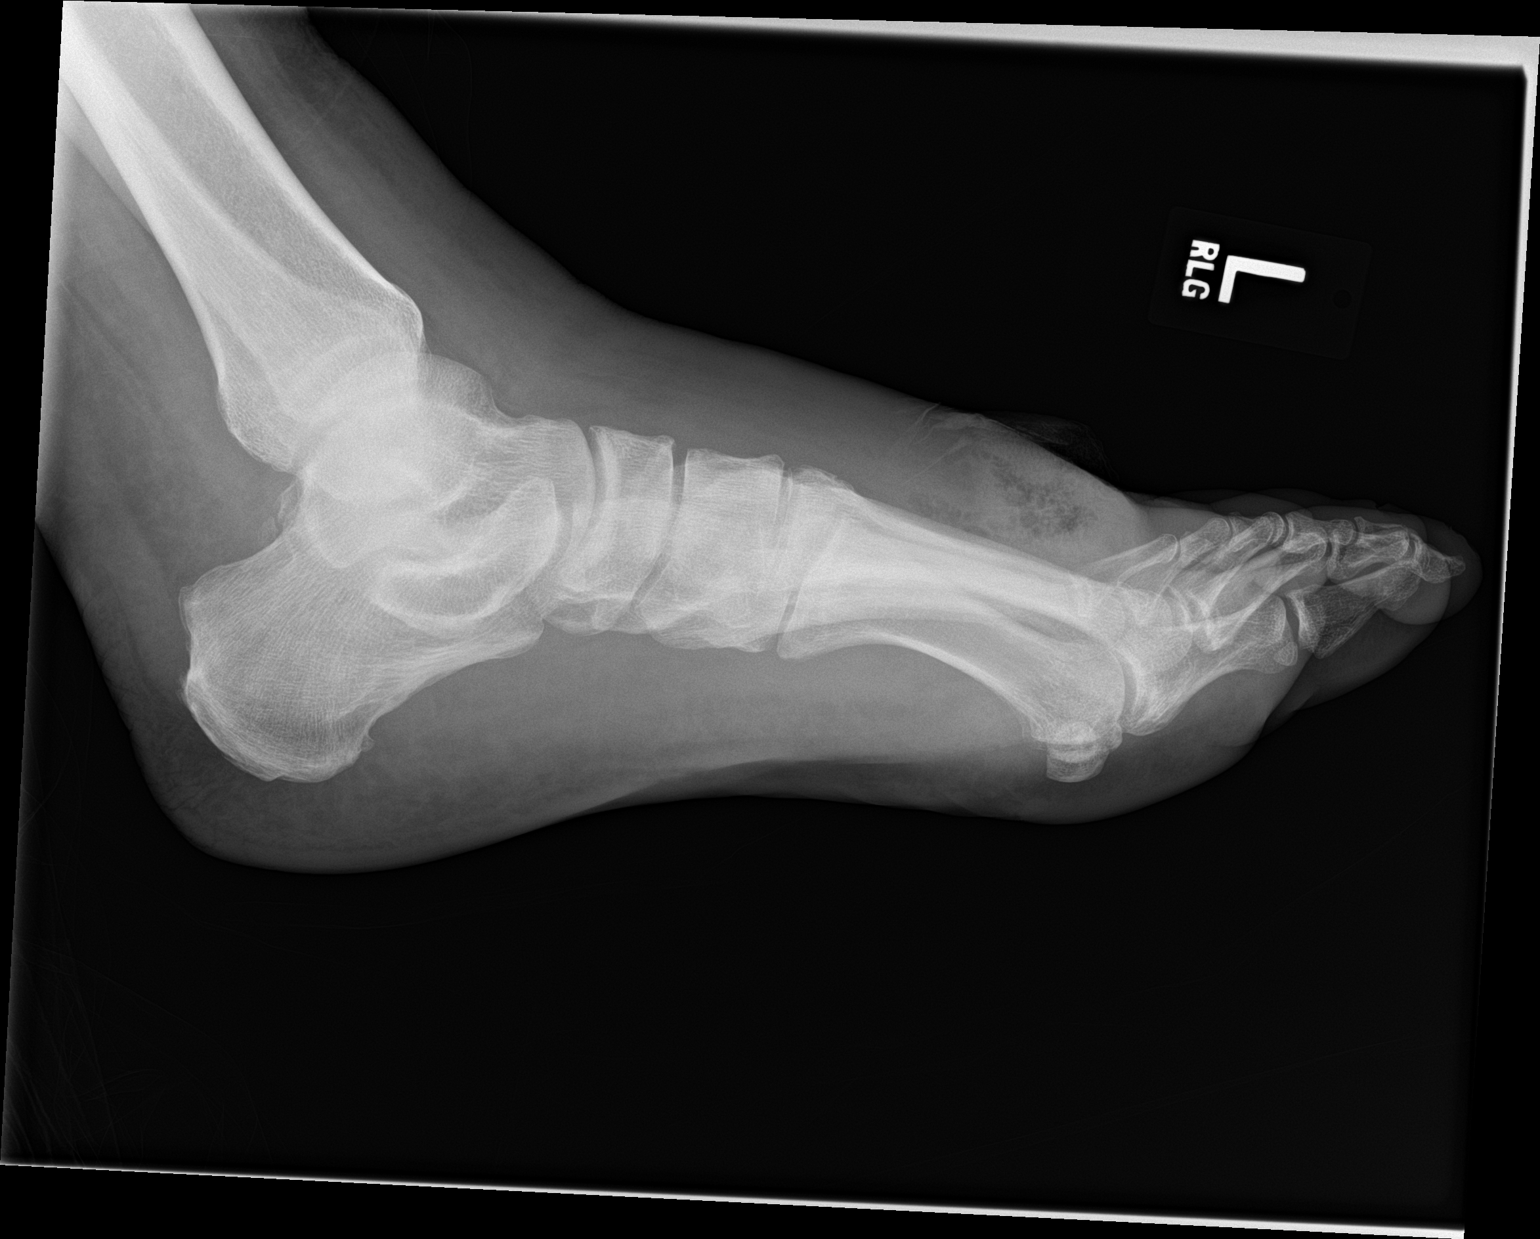

[3 of 3 positions shown; findings below may reference images not displayed]

FINDINGS: Soft tissue swelling with subcutaneous gas in the soft tissues
dorsal and lateral to the fifth MTP joint. The underlying bony
structures appear intact. No rare fraction, fragmentation or
fracture. Soft tissue swelling extends along the dorsum of the foot.
No evidence of inflammatory arthropathy.
IMPRESSION: 1. Soft tissue swelling and subcutaneous gas concerning for
infection, including necrotizing soft tissue infection, dorsal and
lateral to the fifth MTP joint. Recommend surgical consultation.
2. No conventional radiographic evidence of osteomyelitis at this
time.

## 2020-05-07 IMAGING — MR MR FOOT*L* W/O CM
4 of 5 series · 19 of 40 positions shown · non-contrast
Comparison: None.

CLINICAL DATA: Osteomyelitis of the left foot. Foot pain and
swelling

EXAM:
MRI OF THE LEFT FOOT WITHOUT CONTRAST
TECHNIQUE: Multiplanar, multisequence MR imaging of the left foot was
performed. No intravenous contrast was administered.

[Series 5: T1 · coronal · 3.0mm · 0.23mm/px · 10 of 49 slices shown (1 of 2)]
[im 1/49]
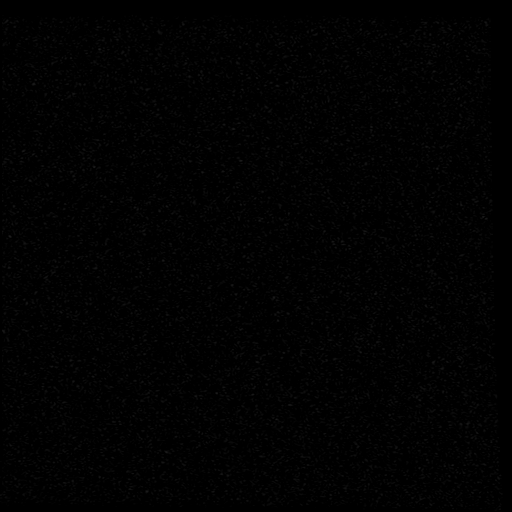
[im 5/49]
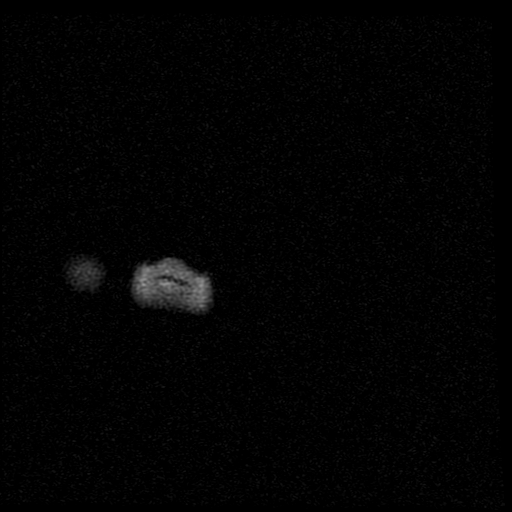
[im 10/49]
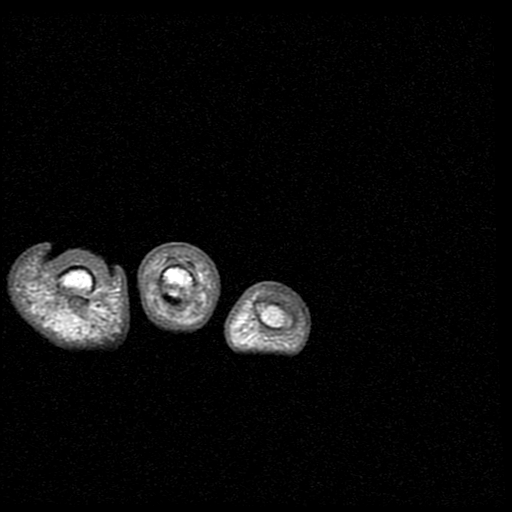
[im 15/49]
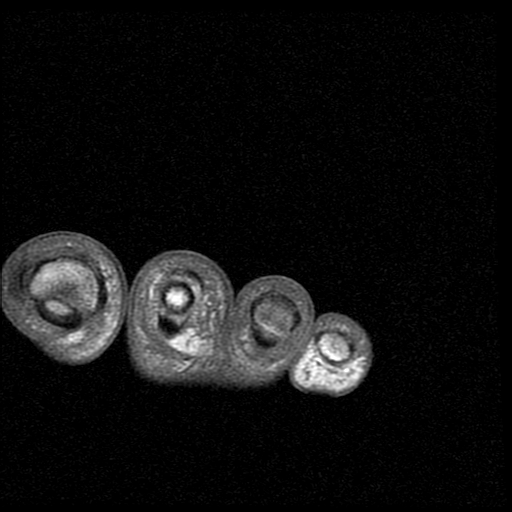
[im 20/49]
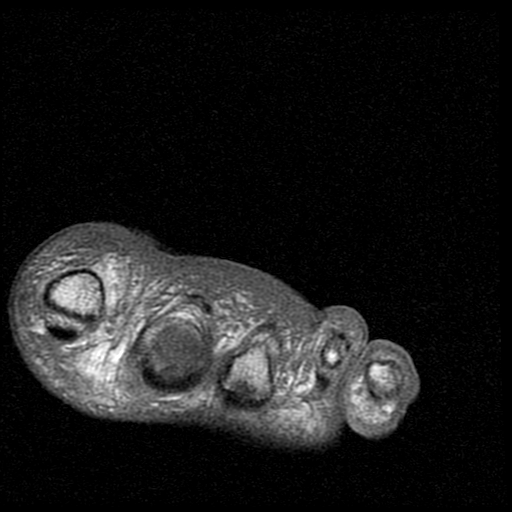
[im 25/49]
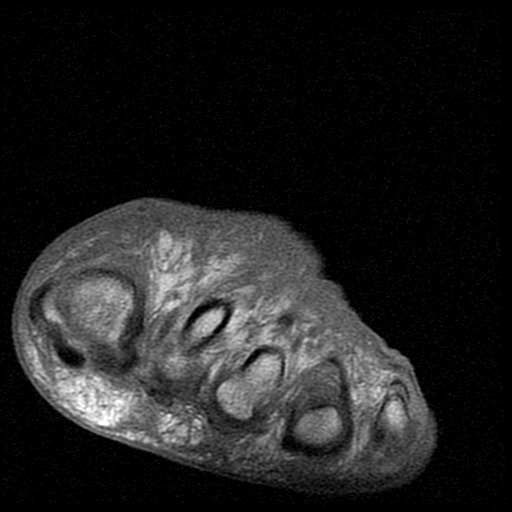
[im 29/49]
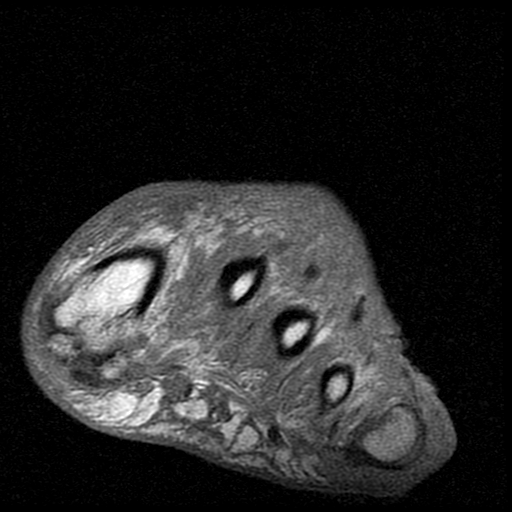
[im 34/49]
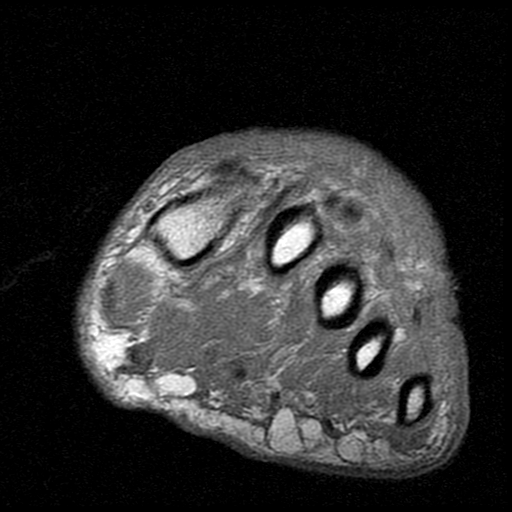
[im 39/49]
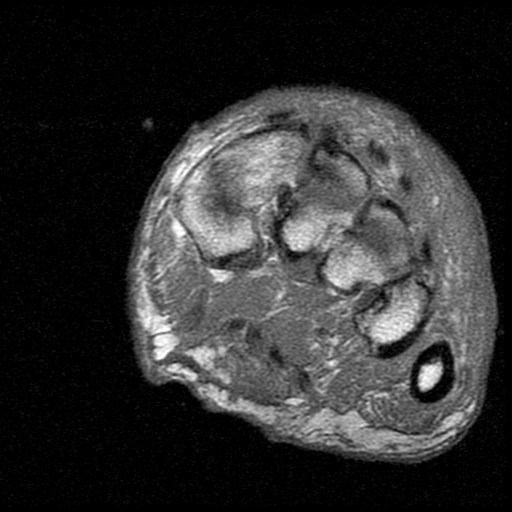
[im 44/49]
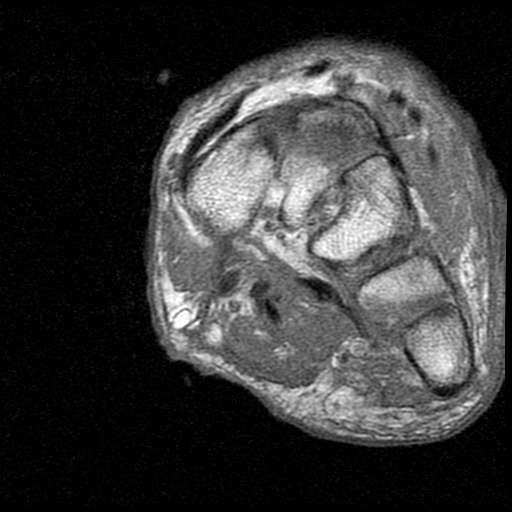

[Series 6: T2 · coronal · 3.0mm · 0.23mm/px · 3 of 49 slices shown (1 of 2)]
[im 6/49]
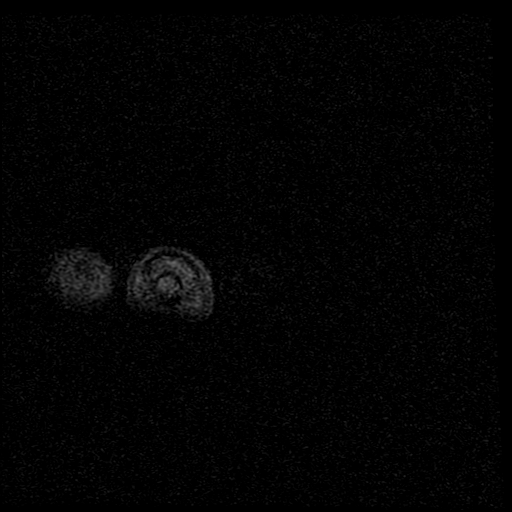
[im 27/49]
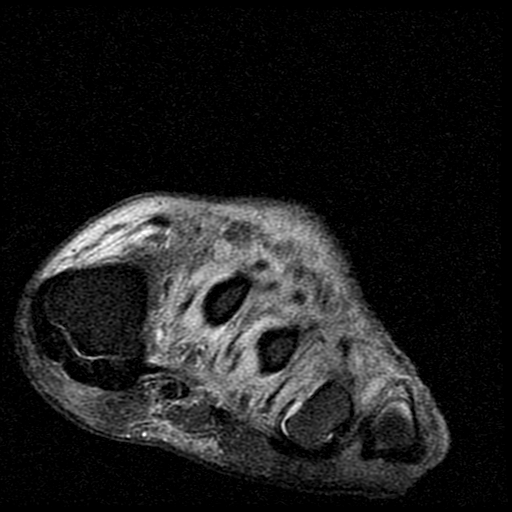
[im 43/49]
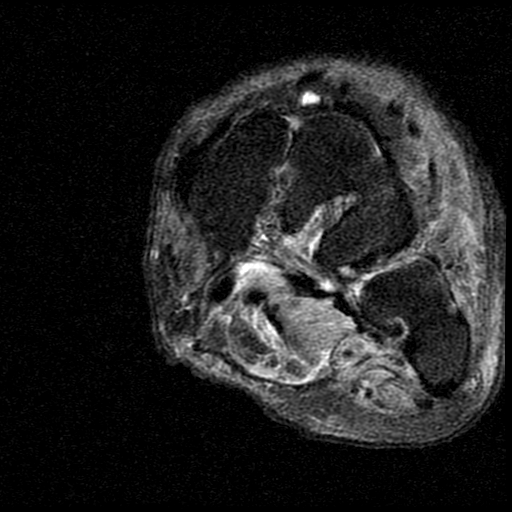

[Series 7: T1 · axial · 3.0mm · 0.35mm/px · z∈[-61,+24]mm · 3 of 30 slices shown (2 of 2)]
[im 6/30]
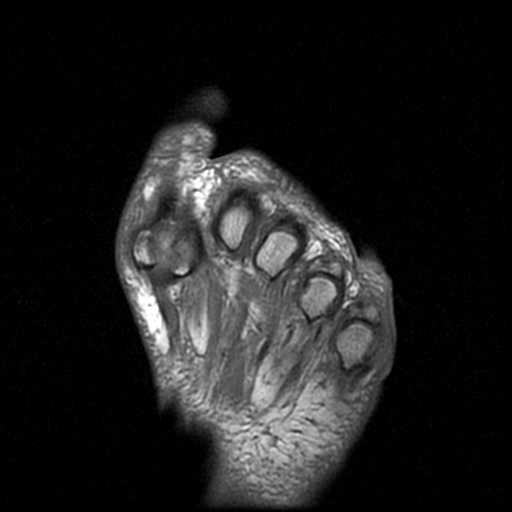
[im 18/30]
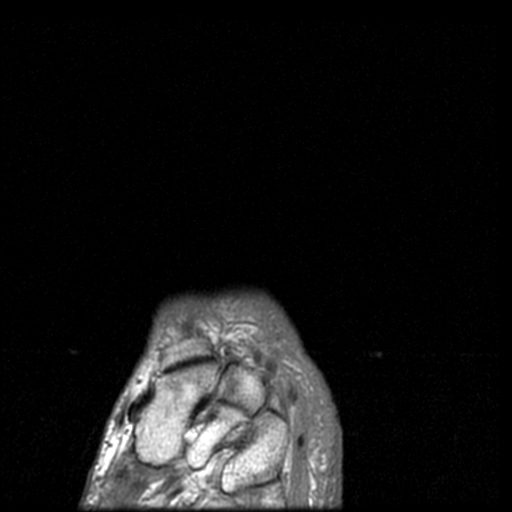
[im 30/30]
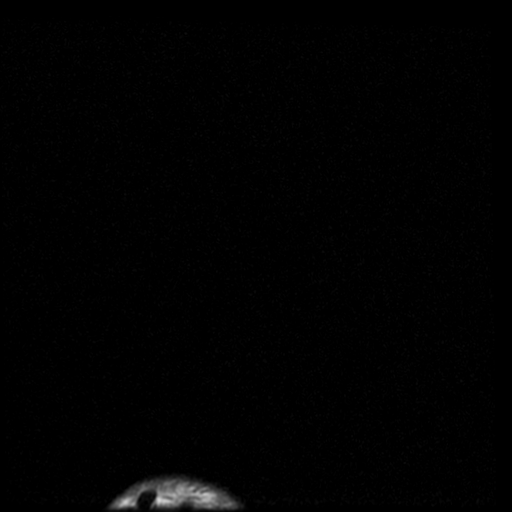

[Series 8: T2 · axial · 3.0mm · 0.35mm/px · z∈[-61,+24]mm · 3 of 30 slices shown (2 of 2)]
[im 6/30]
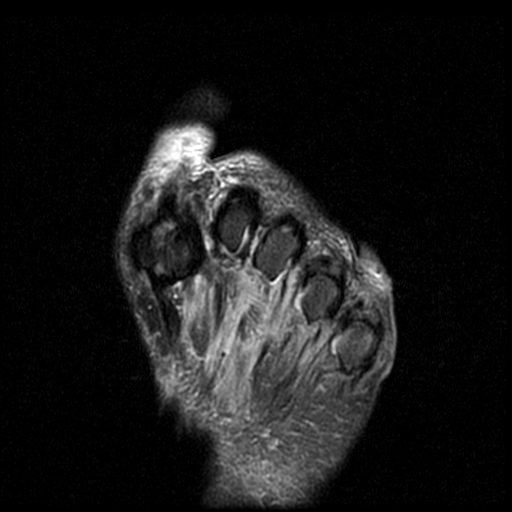
[im 18/30]
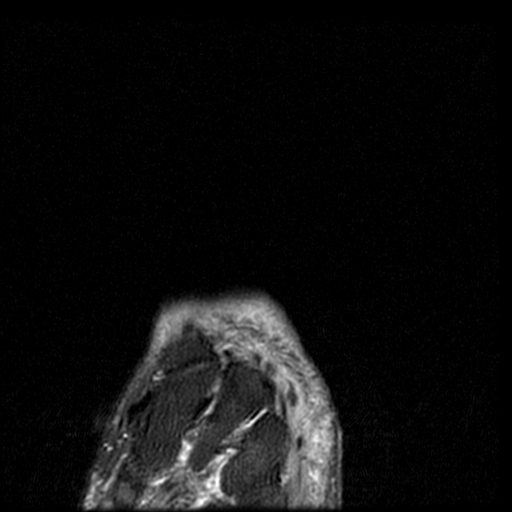
[im 30/30]
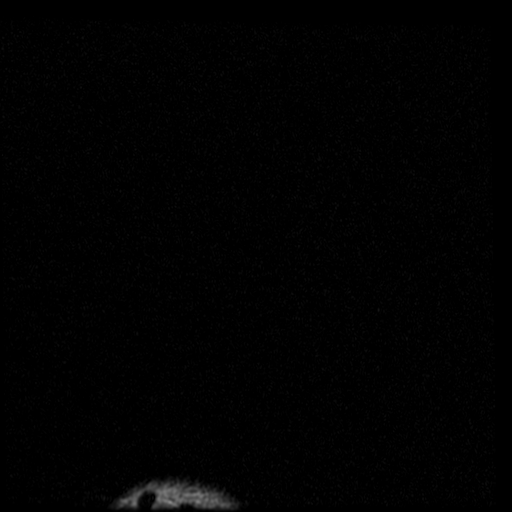

[19 of 40 positions shown; findings below may reference images not displayed]

FINDINGS: Bones/Joint/Cartilage

Soft tissue wound overlying the lateral aspect of the fifth MTP
joint. Marrow edema in the fifth metatarsal head and base of the
fifth proximal phalanx with a small fifth MTP joint effusion. These
may reflect reactive changes secondary to adjacent soft tissue wound
versus mild early septic arthritis-osteomyelitis.

No other marrow signal abnormality. No acute fracture or
dislocation. No aggressive osseous lesion. No periosteal reaction or
bone destruction. Mild osteoarthritis of the first MTP joint.

Ligaments

Collateral ligaments are intact.  Lisfranc ligament is intact.

Muscles and Tendons
T2 hyperintense signal throughout the plantar musculature likely
neurogenic. Flexor, extensor, and peroneal tendons are grossly
intact.

Soft tissue
No fluid collection or hematoma. No soft tissue mass. Generalized
soft tissue edema along the dorsal aspect of the foot which may be
reactive secondary to venous insufficiency versus cellulitis.
IMPRESSION: Soft tissue wound overlying the lateral aspect of the fifth MTP
joint. Marrow edema in the fifth metatarsal head and base of the
fifth proximal phalanx with a small fifth MTP joint effusion. These
may reflect reactive changes secondary to adjacent soft tissue wound
versus mild early septic arthritis-osteomyelitis.

Generalized soft tissue edema along the dorsal aspect of the foot
which may be reactive secondary to venous insufficiency versus
cellulitis.

## 2020-05-14 MED FILL — GABAPENTIN 300 MG CAPSULE: 300 | 30 days supply | Qty: 60 | Fill #2

## 2020-05-14 MED FILL — METOPROLOL TARTRATE 50 MG T: 50 | 30 days supply | Qty: 60 | Fill #1

## 2020-05-14 MED FILL — NOVOLOG FLEXPEN SYRINGE: 100 | 28 days supply | Qty: 6 | Fill #6

## 2020-05-14 MED FILL — LEVEMIR FLEXTOUCH 100 UNITS: 100 | 30 days supply | Qty: 6 | Fill #2

## 2020-05-14 MED FILL — TRUE METRIX TEST STRIP: 50 days supply | Qty: 200 | Fill #3

## 2020-05-17 ENCOUNTER — Telehealth: Payer: Self-pay | Admitting: *Deleted

## 2020-05-17 NOTE — Telephone Encounter (Signed)
Patient requested an earlier appointment with PCP for medication RF. Last OV was Nov. 2020. He has been informed to call the office to see if there are any cancellations and that he would be wait listed for cancellations.

## 2020-05-17 NOTE — Telephone Encounter (Signed)
Copied from [place] [phone]. Topic: General - Other >> May 17, 2020 11:52 AM Antonieta Iba C wrote: Reason for CRM: pt called in to schedule medication follow up apt with PCP. Pt says that he is completely out of his medication. Scheduled pt for PCP first available. Pt would like to know if possible could PCP see him sooner than scheduled due to him being out of med?    Please advise.

## 2020-05-29 ENCOUNTER — Other Ambulatory Visit: Payer: Self-pay | Admitting: Internal Medicine

## 2020-05-29 DIAGNOSIS — N179 Acute kidney failure, unspecified: Secondary | ICD-10-CM

## 2020-06-08 MED FILL — !LEVEMIR FLEXTOUCH 100 UNIT: 100 | 30 days supply | Qty: 6 | Fill #3

## 2020-06-08 MED FILL — !NOVOLOG FLEXPEN SYRINGE 1: 100/ML | 28 days supply | Qty: 6 | Fill #7

## 2020-06-12 ENCOUNTER — Telehealth: Payer: Self-pay | Admitting: Family Medicine

## 2020-06-12 NOTE — Telephone Encounter (Signed)
Patient called asking if we had copies of his social security card on file. I did not see a copy of the social and informed the patient the only thing I can see were the last 4 digits of his SSN. Patient requested that we print out the last 4 digits of the social security number so that he can come pick them up tomorrow. Patient states that he is applying for jobs and is having trouble getting in touch with Social Security.

## 2020-06-12 NOTE — Telephone Encounter (Signed)
I will let the patient know. Thanks

## 2020-06-25 ENCOUNTER — Other Ambulatory Visit: Payer: Self-pay | Admitting: Family Medicine

## 2020-06-25 NOTE — Telephone Encounter (Signed)
Due to high No show rate pt given 30 day supply until upcoming appt Requested Prescriptions  Pending Prescriptions Disp Refills   gabapentin (NEURONTIN) 300 MG capsule [Pharmacy Med Name: GABAPENTIN 300 MG CAPSULE 300 Capsule] 60 capsule 0    Sig: TAKE 1 CAPSULE (300 MG TOTAL) BY MOUTH 2 (TWO) TIMES DAILY.     Neurology: Anticonvulsants - gabapentin Failed - 06/25/2020  1:28 PM      Failed - Valid encounter within last 12 months    Recent Outpatient Visits          1 year ago Pedal edema   [place], [place], MD   1 year ago Type 2 diabetes mellitus with other specified complication, with long-term current use of insulin ([place])   [place], Enobong, MD   1 year ago Essential hypertension   [place], Charlane Ferretti, MD   4 years ago Cellulitis of finger of right hand   [facility] [place], PA-C      Future Appointments            In 1 week Charlott Rakes, MD [place]

## 2020-07-03 ENCOUNTER — Other Ambulatory Visit: Payer: Self-pay | Admitting: Family Medicine

## 2020-07-03 ENCOUNTER — Ambulatory Visit: Payer: Self-pay | Admitting: Family Medicine

## 2020-07-03 MED FILL — TRUE METRIX TEST STRIP: 50 days supply | Qty: 200 | Fill #0

## 2020-07-03 MED FILL — GABAPENTIN 300 MG CAPSULE: 300 | 30 days supply | Qty: 60 | Fill #0

## 2020-07-03 NOTE — Telephone Encounter (Signed)
Requested medication (s) are due for refill today: Yes  Requested medication (s) are on the active medication list: Yes  Last refill:  06/21/19  Future visit scheduled: Appointment for today cancelled.  Notes to clinic:  See request.    Requested Prescriptions  Pending Prescriptions Disp Refills   insulin aspart (NOVOLOG) 100 UNIT/ML FlexPen [Pharmacy Med Name: NOVOLOG FLEXPEN SYRINGE 1 100/ML INSU] 6 mL 3    Sig: Inject 7 Units into the skin 3 (three) times daily with meals.      Endocrinology:  Diabetes - Insulins Failed - 07/03/2020  2:02 PM      Failed - HBA1C is between 0 and 7.9 and within 180 days    Hgb A1c MFr Bld  Date Value Ref Range Status  05/27/2019 12.7 (H) 4.8 - 5.6 % Final    Comment:    (NOTE) Pre diabetes:          5.7%-6.4% Diabetes:              >6.4% Glycemic control for   <7.0% adults with diabetes           Failed - Valid encounter within last 6 months    Recent Outpatient Visits           1 year ago Pedal edema   [place], [place], MD   1 year ago Type 2 diabetes mellitus with other specified complication, with long-term current use of insulin ([place])   [place], MD   1 year ago Essential hypertension   [place], Charlane Ferretti, MD   4 years ago Cellulitis of finger of right hand   [facility] [place], PA-C       Future Appointments             In 1 month Charlott Rakes, MD [place]

## 2020-07-03 NOTE — Telephone Encounter (Signed)
Patient called and he says he's on the other line with the front office to schedule an appointment.

## 2020-07-03 NOTE — Telephone Encounter (Signed)
Requested medication (s) are due for refill today: Yes  Requested medication (s) are on the active medication list: Yes  Last refill:  06/21/19  Future visit scheduled: Yes  Notes to clinic:  Unable to refill per protocol, expired Rx     Requested Prescriptions  Pending Prescriptions Disp Refills   LEVEMIR FLEXTOUCH 100 UNIT/ML FlexPen [Pharmacy Med Name: LEVEMIR FLEXTOUCH 100 UNIT 100 Solution Pen-injector] 6 mL 3    Sig: INJECT 20 UNITS INTO THE SKIN DAILY.      Endocrinology:  Diabetes - Insulins Failed - 07/03/2020  2:08 PM      Failed - HBA1C is between 0 and 7.9 and within 180 days    Hgb A1c MFr Bld  Date Value Ref Range Status  05/27/2019 12.7 (H) 4.8 - 5.6 % Final    Comment:    (NOTE) Pre diabetes:          5.7%-6.4% Diabetes:              >6.4% Glycemic control for   <7.0% adults with diabetes           Failed - Valid encounter within last 6 months    Recent Outpatient Visits           1 year ago Pedal edema   [place], [place], MD   1 year ago Type 2 diabetes mellitus with other specified complication, with long-term current use of insulin ([place])   [place], MD   1 year ago Essential hypertension   [place], Charlane Ferretti, MD   4 years ago Cellulitis of finger of right hand   [facility] [place], PA-C

## 2020-07-04 ENCOUNTER — Other Ambulatory Visit: Payer: Self-pay | Admitting: Family Medicine

## 2020-07-04 MED FILL — NOVOLOG FLEXPEN SYRINGE: 100 | 28 days supply | Qty: 6 | Fill #0

## 2020-07-04 MED FILL — LEVEMIR FLEXTOUCH 100 UNITS: 100 | 30 days supply | Qty: 6 | Fill #0

## 2020-08-07 ENCOUNTER — Other Ambulatory Visit: Payer: Self-pay | Admitting: Family Medicine

## 2020-08-07 MED FILL — TRUE METRIX GLUCOSE TEST ST: 20 days supply | Qty: 100 | Fill #1

## 2020-08-07 MED FILL — GABAPENTIN 300 MG CAPSULE: 300 | 15 days supply | Qty: 30 | Fill #0

## 2020-08-07 MED FILL — NOVOLOG FLEXPEN SYRINGE: 100 | 28 days supply | Qty: 6 | Fill #0

## 2020-08-07 MED FILL — LEVEMIR FLEXTOUCH 100 UNITS: 100 | 30 days supply | Qty: 6 | Fill #0

## 2020-08-23 ENCOUNTER — Ambulatory Visit: Payer: Self-pay | Attending: Family Medicine | Admitting: Family Medicine

## 2020-08-23 ENCOUNTER — Other Ambulatory Visit: Payer: Self-pay

## 2020-09-03 ENCOUNTER — Ambulatory Visit: Payer: Self-pay

## 2020-09-03 ENCOUNTER — Other Ambulatory Visit: Payer: Self-pay | Admitting: Family Medicine

## 2020-09-03 MED FILL — TRUE METRIX GLUCOSE TEST ST: 20 days supply | Qty: 100 | Fill #2

## 2020-09-03 MED FILL — LEVEMIR FLEXTOUCH 100 UNITS: 100 | 30 days supply | Qty: 6 | Fill #0

## 2020-09-03 MED FILL — NOVOLOG FLEXPEN SYRINGE: 100 | 28 days supply | Qty: 6 | Fill #0

## 2020-09-03 NOTE — Telephone Encounter (Signed)
Pt in need of Test strips, insulin and NOVOLOG FLEXPEN 100 UNIT/ML FlexPen LEVEMIR FLEXTOUCH 100 UNIT/ML FlexPen Please advise and thank you

## 2020-09-03 NOTE — Telephone Encounter (Signed)
Pt has not been seen since 2020. Refill if appropriate

## 2020-09-03 NOTE — Telephone Encounter (Signed)
Pt. States he has been out of his insulin x 2 days and BP medication "for awhile." BS last night was 419. Has frequent urination. States he missed his appointment 08/23/20. "I'm having a hard time." Warm transfer to [place] in the practice.   Answer Assessment - Initial Assessment Questions 1. BLOOD GLUCOSE: "What is your blood glucose level?"      419 2. ONSET: "When did you check the blood glucose?"     Last night 3. USUAL RANGE: "What is your glucose level usually?" (e.g., usual fasting morning value, usual evening value)     Fasting 119-150 4. KETONES: "Do you check for ketones (urine or blood test strips)?" If yes, ask: "What does the test show now?"      No 5. TYPE 1 or 2:  "Do you know what type of diabetes you have?"  (e.g., Type 1, Type 2, Gestational; doesn't know)      Type 1  6. INSULIN: "Do you take insulin?" "What type of insulin(s) do you use? What is the mode of delivery? (syringe, pen; injection or pump)?"      Yes 7. DIABETES PILLS: "Do you take any pills for your diabetes?" If yes, ask: "Have you missed taking any pills recently?"     No 8. OTHER SYMPTOMS: "Do you have any symptoms?" (e.g., fever, frequent urination, difficulty breathing, dizziness, weakness, vomiting)     Frequent urination 9. PREGNANCY: "Is there any chance you are pregnant?" "When was your last menstrual period?"     N//a  Protocols used: DIABETES - HIGH BLOOD SUGAR-A-AH

## 2020-09-03 NOTE — Telephone Encounter (Signed)
Pt has not been seen since 2020. Will need approvals from PCP. I have pended these orders and will rout to PCP for review.

## 2020-09-04 ENCOUNTER — Other Ambulatory Visit: Payer: Self-pay | Admitting: Family Medicine

## 2020-09-04 MED ORDER — GLUCOSE BLOOD VI STRP: ORAL_STRIP | 0 refills | Status: DC

## 2020-09-04 MED ORDER — LEVEMIR FLEXTOUCH 100 UNIT/ML ~~LOC~~ SOPN: PEN_INJECTOR | SUBCUTANEOUS | 0 refills | Status: DC

## 2020-09-04 MED ORDER — NOVOLOG FLEXPEN 100 UNIT/ML ~~LOC~~ SOPN: PEN_INJECTOR | SUBCUTANEOUS | 0 refills | Status: DC

## 2020-09-04 MED FILL — GABAPENTIN 300 MG CAPSULE: 300 | 15 days supply | Qty: 30 | Fill #0

## 2020-09-25 ENCOUNTER — Other Ambulatory Visit: Payer: Self-pay | Admitting: Family Medicine

## 2020-09-25 MED FILL — TRUE METRIX GLUCOSE TEST ST: 25 days supply | Qty: 100 | Fill #0

## 2020-09-25 MED FILL — !NOVOLOG FLEXPEN SYRINGE 1: 100/ML | 28 days supply | Qty: 6 | Fill #0

## 2020-09-25 NOTE — Telephone Encounter (Signed)
   Notes to clinic: Patient has appointment tomorrow   Requested Prescriptions  Pending Prescriptions Disp Refills   gabapentin (NEURONTIN) 300 MG capsule [Pharmacy Med Name: GABAPENTIN 300 MG CAPSULE 300 Capsule] 30 capsule 0    Sig: TAKE 1 CAPSULE (300 MG TOTAL) BY MOUTH 2 (TWO) TIMES DAILY.      Neurology: Anticonvulsants - gabapentin Passed - 09/25/2020  9:52 AM      Passed - Valid encounter within last 12 months    Recent Outpatient Visits           1 year ago Pedal edema   [place], [place], MD   1 year ago Type 2 diabetes mellitus with other specified complication, with long-term current use of insulin ([place])   [place] Charlott Rakes, MD   1 year ago Essential hypertension   [place], Charlane Ferretti, MD   5 years ago Cellulitis of finger of right hand   [facility] [place], Audrie Lia, PA-C       Future Appointments             Tomorrow Charlott Rakes, MD [place] 100 UNIT/ML FlexPen [Pharmacy Med Name: LEVEMIR FLEXTOUCH 100 UNITS 100 Solution Pen-injector] 6 mL 0    Sig: INJECT 20 UNITS INTO THE SKIN DAILY. MUST KEEP UPCOMING APPT FOR MORE REFILLS      Endocrinology:  Diabetes - Insulins Failed - 09/25/2020  9:52 AM      Failed - HBA1C is between 0 and 7.9 and within 180 days    Hgb A1c MFr Bld  Date Value Ref Range Status  05/27/2019 12.7 (H) 4.8 - 5.6 % Final    Comment:    (NOTE) Pre diabetes:          5.7%-6.4% Diabetes:              >6.4% Glycemic control for   <7.0% adults with diabetes           Passed - Valid encounter within last 6 months    Recent Outpatient Visits           1 year ago Pedal edema   [place], [place], MD   1 year ago Type 2 diabetes mellitus with other specified complication, with long-term current use of  insulin ([place])   [place], MD   1 year ago Essential hypertension   [place], Charlane Ferretti, MD   5 years ago Cellulitis of finger of right hand   [facility], Audrie Lia, PA-C       Future Appointments             Tomorrow Charlott Rakes, MD [place]

## 2020-09-26 ENCOUNTER — Other Ambulatory Visit: Payer: Self-pay

## 2020-09-26 ENCOUNTER — Ambulatory Visit: Payer: Self-pay | Attending: Family Medicine | Admitting: Family Medicine

## 2020-09-26 ENCOUNTER — Encounter: Payer: Self-pay | Admitting: Family Medicine

## 2020-09-26 ENCOUNTER — Other Ambulatory Visit: Payer: Self-pay | Admitting: Family Medicine

## 2020-09-26 DIAGNOSIS — I10 Essential (primary) hypertension: Secondary | ICD-10-CM

## 2020-09-26 DIAGNOSIS — E1169 Type 2 diabetes mellitus with other specified complication: Secondary | ICD-10-CM

## 2020-09-26 DIAGNOSIS — Z794 Long term (current) use of insulin: Secondary | ICD-10-CM

## 2020-09-26 MED ORDER — LISINOPRIL-HYDROCHLOROTHIAZIDE 10-12.5 MG PO TABS: 1.0000 | ORAL_TABLET | Freq: Every day | ORAL | 6 refills | Status: DC

## 2020-09-26 MED ORDER — LEVEMIR FLEXTOUCH 100 UNIT/ML ~~LOC~~ SOPN: 22.0000 [IU] | PEN_INJECTOR | Freq: Every day | SUBCUTANEOUS | 6 refills | Status: DC

## 2020-09-26 MED ORDER — GABAPENTIN 300 MG PO CAPS: 300.0000 mg | ORAL_CAPSULE | Freq: Two times a day (BID) | ORAL | 6 refills | Status: DC

## 2020-09-26 MED ORDER — METOPROLOL TARTRATE 50 MG PO TABS: 50.0000 mg | ORAL_TABLET | Freq: Two times a day (BID) | ORAL | 6 refills | Status: DC

## 2020-09-26 MED ORDER — ATORVASTATIN CALCIUM 20 MG PO TABS: 20.0000 mg | ORAL_TABLET | Freq: Every day | ORAL | 6 refills | Status: DC

## 2020-09-26 MED ORDER — NOVOLOG FLEXPEN 100 UNIT/ML ~~LOC~~ SOPN: 0.0000 [IU] | PEN_INJECTOR | Freq: Three times a day (TID) | SUBCUTANEOUS | 6 refills | Status: DC

## 2020-09-26 MED FILL — ATORVASTATIN CALCIUM 20 MG: 20 | 30 days supply | Qty: 30 | Fill #0

## 2020-09-26 MED FILL — !LEVEMIR FLEXTOUCH 100 UNIT: 100 | 27 days supply | Qty: 6 | Fill #0

## 2020-09-26 MED FILL — LISINOPRIL-HCTZ 10-12.5 MG: 10-12.5 | 30 days supply | Qty: 30 | Fill #0

## 2020-09-26 MED FILL — METOPROLOL TARTRATE 50 MG T: 50 | 30 days supply | Qty: 60 | Fill #0

## 2020-09-26 NOTE — Progress Notes (Signed)
Virtual Visit via Telephone Note  I connected with Taylor Hughes, on 09/26/2020 at 8:39 AM by telephone due to the COVID-19 pandemic and verified that I am speaking with the correct person using two identifiers.   Consent: I discussed the limitations, risks, security and privacy concerns of performing an evaluation and management service by telephone and the availability of in person appointments. I also discussed with the patient that there may be a patient responsible charge related to this service. The patient expressed understanding and agreed to proceed.   Location of Patient: Home  Location of Provider: Clinic   Persons participating in Telemedicine visit: Atilla Zollner [place]-CMA Dr. Margarita Rana     History of Present Illness: Taylor Hughes is a 50 year old male with a history of type 2 diabetes mellitus (A1c of 12.7 from 11/2018), L fifth toe ray amputation, hypertension, neck abscesswho is seen for follow-up visit today.Has not been seen since 06/2019 He was incarcerated for 4 months and when he got released he states he was trying to get himself together.  Has been out of his antihypertensives for a couple of months. Was out of medications for diabetes for 1 month. Blood sugar readings are in the upper 200 range. He has no additional concerns today. Past Medical History:  Diagnosis Date  . Atrial fibrillation ([place])   . Diabetes mellitus without complication (HCC)    Type 2  . GERD (gastroesophageal reflux disease)   . Wears glasses   . Wound, open, foot    left diabetic    No Known Allergies  Current Outpatient Medications on File Prior to Visit  Medication Sig Dispense Refill  . blood glucose meter kit and supplies KIT Dispense based on patient and insurance preference. Use up to four times daily as directed. (FOR ICD-9 250.00, 250.01). 1 each 0  . gabapentin (NEURONTIN) 300 MG capsule TAKE 1 CAPSULE (300 MG TOTAL) BY MOUTH 2 (TWO) TIMES DAILY. 30 capsule 0  .  glucose blood test strip Use as instructed 100 each 0  . ibuprofen (ADVIL) 800 MG tablet Take 1 tablet (800 mg total) by mouth 3 (three) times daily. 21 tablet 0  . insulin aspart (NOVOLOG FLEXPEN) 100 UNIT/ML FlexPen INJECT 7 UNITS INTO THE SKIN 3 (THREE) TIMES DAILY WITH MEALS. MUST KEEP UPCOMING OFFICE VISIT FOR REFILLS 6 mL 0  . insulin detemir (LEVEMIR FLEXTOUCH) 100 UNIT/ML FlexPen INJECT 20 UNITS INTO THE SKIN DAILY. MUST KEEP UPCOMING APPT FOR MORE REFILLS 6 mL 0  . Insulin Pen Needle 32G X 5 MM MISC Use with levemir pen as prescribed 100 each 0  . LEVEMIR FLEXTOUCH 100 UNIT/ML FlexPen INJECT 20 UNITS INTO THE SKIN DAILY. MUST KEEP UPCOMING APPT FOR MORE REFILLS 6 mL 0  . lisinopril-hydrochlorothiazide (ZESTORETIC) 10-12.5 MG tablet Take 1 tablet by mouth daily. Please make and keep appointment. 30 tablet 0  . Multiple Vitamin (MULTIVITAMIN WITH MINERALS) TABS tablet Take 1 tablet by mouth daily. 30 tablet 0  . NOVOLOG FLEXPEN 100 UNIT/ML FlexPen INJECT 7 UNITS INTO THE SKIN 3 (THREE) TIMES DAILY WITH MEALS. MUST KEEP UPCOMING OFFICE VISIT FOR REFILLS 6 mL 0  . penicillin v potassium (VEETID) 500 MG tablet Take 1 tablet (500 mg total) by mouth 3 (three) times daily. 21 tablet 0  . TRUEplus Lancets 28G MISC Use to check blood sugar daily. 100 each 11  . cephALEXin (KEFLEX) 500 MG capsule Take 1 capsule (500 mg total) by mouth 4 (four) times daily. (Patient not taking: Reported  on 09/26/2020) 120 capsule 1  . HYDROcodone-acetaminophen (NORCO/VICODIN) 5-325 MG tablet Take 1-2 tablets every 6 hours as needed for severe pain (Patient not taking: Reported on 09/26/2020) 6 tablet 0  . metoprolol tartrate (LOPRESSOR) 50 MG tablet Take 1 tablet (50 mg total) by mouth 2 (two) times daily. 60 tablet 1   No current facility-administered medications on file prior to visit.    ROS: See HPI  Lab Results  Component Value Date   HGBA1C 12.7 (H) 05/27/2019    Observations/Objective: Awake, alert, and  2x3 Not in acute distress Speaks in full sentences  Lab Results  Component Value Date   HGBA1C 12.7 (H) 05/27/2019    Assessment and Plan: 1. Type 2 diabetes mellitus with other specified complication, with long-term current use of insulin (HCC) Uncontrolled with A1c of 12.7; goal of less than 7.0 He is overdue for an A1c and this has been ordered I will adjust his regimen accordingly after A1c has been received - CMP14+EGFR; Future - Lipid panel; Future - Microalbumin / creatinine urine ratio; Future - Hemoglobin A1c; Future - gabapentin (NEURONTIN) 300 MG capsule; Take 1 capsule (300 mg total) by mouth 2 (two) times daily.  Dispense: 60 capsule; Refill: 6 - metoprolol tartrate (LOPRESSOR) 50 MG tablet; Take 1 tablet (50 mg total) by mouth 2 (two) times daily.  Dispense: 60 tablet; Refill: 6 - insulin aspart (NOVOLOG FLEXPEN) 100 UNIT/ML FlexPen; Inject 0-12 Units into the skin 3 (three) times daily with meals. Per sliding scale  Dispense: 30 mL; Refill: 6 - insulin detemir (LEVEMIR FLEXTOUCH) 100 UNIT/ML FlexPen; Inject 22 Units into the skin at bedtime.  Dispense: 30 mL; Refill: 6 - atorvastatin (LIPITOR) 20 MG tablet; Take 1 tablet (20 mg total) by mouth daily.  Dispense: 30 tablet; Refill: 6  2. Essential hypertension Likely to be uncontrolled due to running out of medications which I have refilled Counseled on blood pressure goal of less than 130/80, low-sodium, DASH diet, medication compliance, 150 minutes of moderate intensity exercise per week. Discussed medication compliance, adverse effects. - lisinopril-hydrochlorothiazide (ZESTORETIC) 10-12.5 MG tablet; Take 1 tablet by mouth daily.  Dispense: 30 tablet; Refill: 6    Follow Up Instructions: Return for Fasting labs on 10/01/2020; PCP 1 month.    I discussed the assessment and treatment plan with the patient. The patient was provided an opportunity to ask questions and all were answered. The patient agreed with the plan  and demonstrated an understanding of the instructions.   The patient was advised to call back or seek an in-person evaluation if the symptoms worsen or if the condition fails to improve as anticipated.     I provided 13 minutes total of non-face-to-face time during this encounter.   Charlott Rakes, MD, FAAFP. [facility] [place], [place]   09/26/2020, 8:39 AM

## 2020-09-26 NOTE — Progress Notes (Signed)
Needs medication refills.

## 2020-10-01 ENCOUNTER — Other Ambulatory Visit: Payer: Self-pay

## 2020-10-08 ENCOUNTER — Other Ambulatory Visit: Payer: Self-pay

## 2020-10-18 MED FILL — !NOVOLOG FLEXPEN SYRINGE 1: 100/ML | 28 days supply | Qty: 6 | Fill #0

## 2020-10-18 MED FILL — !LEVEMIR FLEXTOUCH 100 UNIT: 100 | 27 days supply | Qty: 6 | Fill #1

## 2020-10-18 MED FILL — TRUE METRIX GLUCOSE TEST ST: 20 days supply | Qty: 100 | Fill #3

## 2020-10-18 MED FILL — GABAPENTIN 300 MG CAPSULE: 300 | 30 days supply | Qty: 60 | Fill #0

## 2020-11-01 MED FILL — METOPROLOL TARTRATE 50 MG T: 50 | 30 days supply | Qty: 60 | Fill #1

## 2020-11-01 MED FILL — LISINOPRIL-HCTZ 10-12.5 MG: 10-12.5 | 30 days supply | Qty: 30 | Fill #1

## 2020-11-01 MED FILL — ATORVASTATIN CALCIUM 20 MG: 20 | 30 days supply | Qty: 30 | Fill #1

## 2020-11-07 MED FILL — TRUE METRIX GLUCOSE TEST ST: 20 days supply | Qty: 100 | Fill #4

## 2020-11-12 ENCOUNTER — Other Ambulatory Visit: Payer: Self-pay

## 2020-11-12 MED FILL — Insulin Aspart Soln Pen-injector 100 Unit/ML: SUBCUTANEOUS | 41 days supply | Qty: 15 | Fill #0 | Status: AC

## 2020-11-12 MED FILL — Insulin Detemir Soln Pen-injector 100 Unit/ML: SUBCUTANEOUS | 27 days supply | Qty: 6 | Fill #0 | Status: AC

## 2020-12-06 ENCOUNTER — Other Ambulatory Visit: Payer: Self-pay | Admitting: Family Medicine

## 2020-12-06 ENCOUNTER — Other Ambulatory Visit: Payer: Self-pay

## 2020-12-06 DIAGNOSIS — E1169 Type 2 diabetes mellitus with other specified complication: Secondary | ICD-10-CM

## 2020-12-06 MED ORDER — GABAPENTIN 300 MG PO CAPS
300.0000 mg | ORAL_CAPSULE | Freq: Two times a day (BID) | ORAL | 1 refills | Status: DC
  Filled 2020-12-06 – 2020-12-13 (×2): qty 60, 30d supply, fill #0
  Filled 2021-01-17: qty 60, 30d supply, fill #1

## 2020-12-06 NOTE — Telephone Encounter (Signed)
Medication Refill - Medication: gabapentin (NEURONTIN) 300 MG capsule     Preferred Pharmacy (with phone number or street name):  [facility] [place] Phone:  [phone]  Fax:  [phone]       Agent: Please be advised that RX refills may take up to 3 business days. We ask that you follow-up with your pharmacy.

## 2020-12-12 ENCOUNTER — Other Ambulatory Visit: Payer: Self-pay | Admitting: Family Medicine

## 2020-12-12 ENCOUNTER — Other Ambulatory Visit: Payer: Self-pay

## 2020-12-12 MED ORDER — TRUE METRIX BLOOD GLUCOSE TEST VI STRP
ORAL_STRIP | 0 refills | Status: DC
  Filled 2020-12-12: qty 100, 25d supply, fill #0

## 2020-12-12 MED FILL — Atorvastatin Calcium Tab 20 MG (Base Equivalent): ORAL | 30 days supply | Qty: 30 | Fill #0 | Status: AC

## 2020-12-12 MED FILL — Metoprolol Tartrate Tab 50 MG: ORAL | 30 days supply | Qty: 60 | Fill #0 | Status: AC

## 2020-12-12 MED FILL — Lisinopril & Hydrochlorothiazide Tab 10-12.5 MG: ORAL | 30 days supply | Qty: 30 | Fill #0 | Status: AC

## 2020-12-12 MED FILL — Insulin Detemir Soln Pen-injector 100 Unit/ML: SUBCUTANEOUS | 27 days supply | Qty: 6 | Fill #1 | Status: AC

## 2020-12-13 ENCOUNTER — Other Ambulatory Visit: Payer: Self-pay

## 2020-12-25 ENCOUNTER — Other Ambulatory Visit: Payer: Self-pay

## 2020-12-25 MED FILL — Insulin Aspart Soln Pen-injector 100 Unit/ML: SUBCUTANEOUS | 33 days supply | Qty: 12 | Fill #1 | Status: CN

## 2021-01-01 ENCOUNTER — Other Ambulatory Visit: Payer: Self-pay

## 2021-01-01 MED FILL — Insulin Aspart Soln Pen-injector 100 Unit/ML: SUBCUTANEOUS | 33 days supply | Qty: 12 | Fill #1 | Status: AC

## 2021-01-01 MED FILL — Insulin Detemir Soln Pen-injector 100 Unit/ML: SUBCUTANEOUS | 27 days supply | Qty: 6 | Fill #2 | Status: AC

## 2021-01-02 ENCOUNTER — Other Ambulatory Visit: Payer: Self-pay

## 2021-01-02 ENCOUNTER — Other Ambulatory Visit: Payer: Self-pay | Admitting: *Deleted

## 2021-01-02 ENCOUNTER — Other Ambulatory Visit: Payer: Self-pay | Admitting: Family Medicine

## 2021-01-02 MED ORDER — TRUE METRIX BLOOD GLUCOSE TEST VI STRP
ORAL_STRIP | 0 refills | Status: DC
  Filled 2021-01-02: qty 100, 25d supply, fill #0

## 2021-01-02 MED ORDER — TRUEPLUS LANCETS 28G MISC
11 refills | Status: DC
  Filled 2021-01-02: qty 100, 25d supply, fill #0
  Filled 2021-06-10: qty 100, 25d supply, fill #1

## 2021-01-03 ENCOUNTER — Other Ambulatory Visit: Payer: Self-pay

## 2021-01-04 ENCOUNTER — Other Ambulatory Visit: Payer: Self-pay

## 2021-01-17 ENCOUNTER — Other Ambulatory Visit: Payer: Self-pay

## 2021-01-17 MED FILL — Lisinopril & Hydrochlorothiazide Tab 10-12.5 MG: ORAL | 30 days supply | Qty: 30 | Fill #1 | Status: AC

## 2021-01-21 ENCOUNTER — Other Ambulatory Visit: Payer: Self-pay

## 2021-01-31 ENCOUNTER — Other Ambulatory Visit: Payer: Self-pay | Admitting: Family Medicine

## 2021-01-31 ENCOUNTER — Other Ambulatory Visit: Payer: Self-pay

## 2021-01-31 MED ORDER — TRUE METRIX BLOOD GLUCOSE TEST VI STRP
ORAL_STRIP | 0 refills | Status: DC
  Filled 2021-01-31: qty 100, 25d supply, fill #0

## 2021-01-31 MED FILL — Insulin Aspart Soln Pen-injector 100 Unit/ML: SUBCUTANEOUS | 33 days supply | Qty: 12 | Fill #2 | Status: AC

## 2021-01-31 MED FILL — Insulin Detemir Soln Pen-injector 100 Unit/ML: SUBCUTANEOUS | 27 days supply | Qty: 6 | Fill #3 | Status: AC

## 2021-03-04 ENCOUNTER — Other Ambulatory Visit: Payer: Self-pay | Admitting: Family Medicine

## 2021-03-04 ENCOUNTER — Other Ambulatory Visit: Payer: Self-pay

## 2021-03-04 MED ORDER — TRUE METRIX BLOOD GLUCOSE TEST VI STRP
ORAL_STRIP | 0 refills | Status: DC
  Filled 2021-03-04 – 2021-03-11 (×2): qty 100, 25d supply, fill #0

## 2021-03-04 MED FILL — Insulin Detemir Soln Pen-injector 100 Unit/ML: SUBCUTANEOUS | 95 days supply | Qty: 21 | Fill #4 | Status: CN

## 2021-03-04 MED FILL — Insulin Aspart Soln Pen-injector 100 Unit/ML: SUBCUTANEOUS | 92 days supply | Qty: 33 | Fill #3 | Status: CN

## 2021-03-11 ENCOUNTER — Other Ambulatory Visit: Payer: Self-pay

## 2021-03-11 MED FILL — Insulin Aspart Soln Pen-injector 100 Unit/ML: SUBCUTANEOUS | 33 days supply | Qty: 12 | Fill #3 | Status: AC

## 2021-03-11 MED FILL — Insulin Detemir Soln Pen-injector 100 Unit/ML: SUBCUTANEOUS | 27 days supply | Qty: 6 | Fill #4 | Status: AC

## 2021-04-01 ENCOUNTER — Other Ambulatory Visit: Payer: Self-pay | Admitting: Family Medicine

## 2021-04-01 ENCOUNTER — Other Ambulatory Visit: Payer: Self-pay

## 2021-04-01 MED ORDER — TRUE METRIX BLOOD GLUCOSE TEST VI STRP
1.0000 | ORAL_STRIP | 2 refills | Status: DC
  Filled 2021-04-01: qty 100, 100d supply, fill #0
  Filled 2021-06-10: qty 100, 30d supply, fill #1
  Filled 2021-07-08: qty 100, 30d supply, fill #2

## 2021-04-01 MED FILL — Metoprolol Tartrate Tab 50 MG: ORAL | 30 days supply | Qty: 60 | Fill #1 | Status: AC

## 2021-04-01 MED FILL — Insulin Detemir Soln Pen-injector 100 Unit/ML: SUBCUTANEOUS | 27 days supply | Qty: 6 | Fill #5 | Status: AC

## 2021-04-01 MED FILL — Atorvastatin Calcium Tab 20 MG (Base Equivalent): ORAL | 30 days supply | Qty: 30 | Fill #1 | Status: AC

## 2021-04-01 MED FILL — Lisinopril & Hydrochlorothiazide Tab 10-12.5 MG: ORAL | 30 days supply | Qty: 30 | Fill #2 | Status: AC

## 2021-04-03 ENCOUNTER — Other Ambulatory Visit: Payer: Self-pay | Admitting: Family Medicine

## 2021-04-03 ENCOUNTER — Other Ambulatory Visit: Payer: Self-pay

## 2021-04-03 DIAGNOSIS — Z794 Long term (current) use of insulin: Secondary | ICD-10-CM

## 2021-04-03 DIAGNOSIS — E1169 Type 2 diabetes mellitus with other specified complication: Secondary | ICD-10-CM

## 2021-04-03 NOTE — Telephone Encounter (Signed)
Requested medication (s) are due for refill today: yes  Requested medication (s) are on the active medication list: yes  Last refill:  12/06/20 #60 1 refill  Future visit scheduled: no  Notes to clinic:  [id number]     Requested Prescriptions  Pending Prescriptions Disp Refills   gabapentin (NEURONTIN) 300 MG capsule 60 capsule 1    Sig: Take 1 capsule (300 mg total) by mouth 2 (two) times daily.     Neurology: Anticonvulsants - gabapentin Passed - 04/03/2021  1:35 PM      Passed - Valid encounter within last 12 months    Recent Outpatient Visits           6 months ago Essential hypertension   [place] Charlott Rakes, MD   1 year ago Pedal edema   [place], Charlane Ferretti, MD   2 years ago Type 2 diabetes mellitus with other specified complication, with long-term current use of insulin [facility])   [place], Enobong, MD   2 years ago Essential hypertension   [place], Charlane Ferretti, MD   5 years ago Cellulitis of finger of right hand   [facility] at [facility], Audrie Lia, PA-C

## 2021-04-04 ENCOUNTER — Other Ambulatory Visit: Payer: Self-pay

## 2021-04-05 ENCOUNTER — Other Ambulatory Visit: Payer: Self-pay

## 2021-04-10 ENCOUNTER — Other Ambulatory Visit: Payer: Self-pay

## 2021-04-10 MED FILL — Insulin Aspart Soln Pen-injector 100 Unit/ML: SUBCUTANEOUS | 92 days supply | Qty: 33 | Fill #4 | Status: AC

## 2021-04-11 ENCOUNTER — Other Ambulatory Visit: Payer: Self-pay

## 2021-04-17 ENCOUNTER — Other Ambulatory Visit: Payer: Self-pay

## 2021-04-23 ENCOUNTER — Other Ambulatory Visit: Payer: Self-pay | Admitting: Family Medicine

## 2021-04-23 ENCOUNTER — Other Ambulatory Visit: Payer: Self-pay

## 2021-04-23 NOTE — Telephone Encounter (Signed)
Requested medication (s) are due for refill today: Yes  Requested medication (s) are on the active medication list: Yes  Last refill:  1 year ago by another provider  Future visit scheduled: No  Notes to clinic:  Rx requested for True Metric Meter and testing supplies     Requested Prescriptions  Pending Prescriptions Disp Refills   blood glucose meter kit and supplies KIT 1 each 0    Sig: Dispense based on patient and insurance preference. Use up to four times daily as directed. (FOR ICD-9 250.00, 250.01).     Endocrinology: Diabetes - Testing Supplies Passed - 04/23/2021  2:19 PM      Passed - Valid encounter within last 12 months    Recent Outpatient Visits           6 months ago Essential hypertension   [place], Enobong, MD   1 year ago Pedal edema   [place], Charlane Ferretti, MD   2 years ago Type 2 diabetes mellitus with other specified complication, with long-term current use of insulin [facility])   [place], Enobong, MD   2 years ago Essential hypertension   [place], Charlane Ferretti, MD   5 years ago Cellulitis of finger of right hand   [facility] at [facility], Audrie Lia, PA-C

## 2021-04-25 ENCOUNTER — Other Ambulatory Visit: Payer: Self-pay

## 2021-04-25 ENCOUNTER — Other Ambulatory Visit: Payer: Self-pay | Admitting: Pharmacist

## 2021-04-25 MED ORDER — TRUE METRIX METER W/DEVICE KIT
PACK | 0 refills | Status: DC
  Filled 2021-04-25: qty 1, 30d supply, fill #0

## 2021-04-26 ENCOUNTER — Other Ambulatory Visit: Payer: Self-pay

## 2021-04-26 MED FILL — Insulin Detemir Soln Pen-injector 100 Unit/ML: SUBCUTANEOUS | 95 days supply | Qty: 21 | Fill #6 | Status: AC

## 2021-04-29 ENCOUNTER — Other Ambulatory Visit: Payer: Self-pay

## 2021-05-08 ENCOUNTER — Other Ambulatory Visit: Payer: Self-pay

## 2021-05-22 ENCOUNTER — Ambulatory Visit: Payer: Self-pay | Admitting: Physician Assistant

## 2021-06-04 ENCOUNTER — Emergency Department (HOSPITAL_COMMUNITY): Payer: Self-pay

## 2021-06-04 ENCOUNTER — Inpatient Hospital Stay (HOSPITAL_COMMUNITY)
Admission: EM | Admit: 2021-06-04 | Discharge: 2021-06-10 | DRG: 637 | Disposition: A | Discharge status: Home / Self Care | Payer: Self-pay | Attending: Internal Medicine | Admitting: Internal Medicine

## 2021-06-04 DIAGNOSIS — Z794 Long term (current) use of insulin: Secondary | ICD-10-CM

## 2021-06-04 DIAGNOSIS — Z20822 Contact with and (suspected) exposure to covid-19: Secondary | ICD-10-CM | POA: Diagnosis present

## 2021-06-04 DIAGNOSIS — Z6830 Body mass index (BMI) 30.0-30.9, adult: Secondary | ICD-10-CM

## 2021-06-04 DIAGNOSIS — E876 Hypokalemia: Secondary | ICD-10-CM

## 2021-06-04 DIAGNOSIS — D638 Anemia in other chronic diseases classified elsewhere: Secondary | ICD-10-CM | POA: Diagnosis present

## 2021-06-04 DIAGNOSIS — J069 Acute upper respiratory infection, unspecified: Secondary | ICD-10-CM

## 2021-06-04 DIAGNOSIS — G9341 Metabolic encephalopathy: Secondary | ICD-10-CM | POA: Diagnosis present

## 2021-06-04 DIAGNOSIS — E1110 Type 2 diabetes mellitus with ketoacidosis without coma: Principal | ICD-10-CM | POA: Diagnosis present

## 2021-06-04 DIAGNOSIS — Z79899 Other long term (current) drug therapy: Secondary | ICD-10-CM

## 2021-06-04 DIAGNOSIS — N179 Acute kidney failure, unspecified: Secondary | ICD-10-CM | POA: Diagnosis present

## 2021-06-04 DIAGNOSIS — K219 Gastro-esophageal reflux disease without esophagitis: Secondary | ICD-10-CM | POA: Diagnosis present

## 2021-06-04 DIAGNOSIS — Z833 Family history of diabetes mellitus: Secondary | ICD-10-CM

## 2021-06-04 DIAGNOSIS — E669 Obesity, unspecified: Secondary | ICD-10-CM | POA: Diagnosis present

## 2021-06-04 DIAGNOSIS — E1169 Type 2 diabetes mellitus with other specified complication: Secondary | ICD-10-CM

## 2021-06-04 DIAGNOSIS — Z9114 Patient's other noncompliance with medication regimen: Secondary | ICD-10-CM

## 2021-06-04 DIAGNOSIS — R680 Hypothermia, not associated with low environmental temperature: Secondary | ICD-10-CM | POA: Diagnosis present

## 2021-06-04 DIAGNOSIS — I10 Essential (primary) hypertension: Secondary | ICD-10-CM | POA: Diagnosis present

## 2021-06-04 DIAGNOSIS — I480 Paroxysmal atrial fibrillation: Secondary | ICD-10-CM | POA: Insufficient documentation

## 2021-06-04 DIAGNOSIS — E875 Hyperkalemia: Secondary | ICD-10-CM | POA: Diagnosis present

## 2021-06-04 DIAGNOSIS — Z91199 Patient's noncompliance with other medical treatment and regimen due to unspecified reason: Secondary | ICD-10-CM

## 2021-06-04 DIAGNOSIS — I872 Venous insufficiency (chronic) (peripheral): Secondary | ICD-10-CM | POA: Diagnosis present

## 2021-06-04 DIAGNOSIS — E119 Type 2 diabetes mellitus without complications: Secondary | ICD-10-CM

## 2021-06-04 DIAGNOSIS — D649 Anemia, unspecified: Secondary | ICD-10-CM

## 2021-06-04 DIAGNOSIS — T383X6A Underdosing of insulin and oral hypoglycemic [antidiabetic] drugs, initial encounter: Secondary | ICD-10-CM | POA: Diagnosis present

## 2021-06-04 DIAGNOSIS — Z89422 Acquired absence of other left toe(s): Secondary | ICD-10-CM

## 2021-06-04 LAB — BLOOD GAS, VENOUS
Bicarbonate: 1.8 mmol/L — ABNORMAL LOW (ref 20.0–28.0)
O2 Saturation: 97.6 %
Patient temperature: 98.6
pCO2, Ven: 12 mmHg — CL (ref 44.0–60.0)
pH, Ven: 6.8 — CL (ref 7.250–7.430)
pO2, Ven: 168 mmHg — ABNORMAL HIGH (ref 32.0–45.0)

## 2021-06-04 LAB — CBG MONITORING, ED: Glucose-Capillary: >600 mg/dL (ref 70–99)

## 2021-06-04 MED ORDER — DEXTROSE IN LACTATED RINGERS 5 % IV SOLN: INTRAVENOUS | Status: DC

## 2021-06-04 MED ORDER — LACTATED RINGERS IV BOLUS
20.0000 mL/kg | Freq: Once | INTRAVENOUS | Status: AC
  Administered 2021-06-05: 2722 mL via INTRAVENOUS

## 2021-06-04 MED ORDER — DEXTROSE 50 % IV SOLN
0.0000 mL | INTRAVENOUS | Status: DC | PRN
  Administered 2021-06-06: 50 mL via INTRAVENOUS
  Filled 2021-06-04: qty 50

## 2021-06-04 MED ORDER — INSULIN REGULAR(HUMAN) IN NACL 100-0.9 UT/100ML-% IV SOLN
INTRAVENOUS | Status: DC
  Administered 2021-06-05: 9 [IU]/h via INTRAVENOUS
  Administered 2021-06-05: 20 [IU]/h via INTRAVENOUS
  Administered 2021-06-05: 28 [IU]/h via INTRAVENOUS
  Administered 2021-06-05: 8 [IU]/h via INTRAVENOUS
  Administered 2021-06-06: 0.7 [IU]/h via INTRAVENOUS
  Filled 2021-06-04 (×4): qty 100

## 2021-06-04 MED ORDER — LACTATED RINGERS IV SOLN: INTRAVENOUS | Status: DC

## 2021-06-04 NOTE — ED Notes (Signed)
Male external cath placed on pt.

## 2021-06-04 NOTE — ED Provider Notes (Signed)
[facility] DEPT Provider Note   CSN: [id number] Arrival date & time: 06/04/21  2301     History Chief Complaint  Patient presents with   Altered Mental Status   Hyperglycemia    Taylor Hughes is a 50 y.o. male.  The history is provided by the patient and medical records.   50 y.o. M with hx of DM2, AFIB not on anticoagulation, GERD, obesity, presenting to the ED for hyperglycemia.  Apparently he has been sick for about 3 days with URI symptoms and has not been taking his usual medications including his insulin.  CBG reading "HI".    Past Medical History:  Diagnosis Date   Atrial fibrillation ([place])    Diabetes mellitus without complication ([place])    Type 2   GERD (gastroesophageal reflux disease)    Wears glasses    Wound, open, foot    left diabetic     Patient Active Problem List   Diagnosis Date Noted   Sepsis ([place]) 05/24/2019   Cellulitis of chest wall    Venous insufficiency (chronic) (peripheral) 03/15/2019   Edema of both lower extremities due to peripheral venous insufficiency 03/15/2019   Hypoglycemia due to insulin 12/18/2018   Acute encephalopathy 12/17/2018   Neck abscess 11/23/2018   Essential hypertension 11/23/2018   AKI (acute kidney injury) ([place]) 11/23/2018   Obesity (BMI 30.0-34.9) 11/23/2018   Type 2 diabetes mellitus with other specified complication ([place]) 88/41/6606   Sepsis due to group B Streptococcus ([place])    Diabetic infection of left foot ([place])    Left foot infection    DKA, type 2 ([place]) 07/28/2018   Leukocytosis 07/28/2018   Hyperkalemia 07/28/2018   Hyponatremia 07/28/2018    Past Surgical History:  Procedure Laterality Date   AMPUTATION Left 08/20/2018   Procedure: LEFT FOOT IRRIGATON AND DEBRIDEMENT, 5TH RAY AMPUTATION;  Surgeon: Newt Minion, MD;  Location: [facility];  Service: Orthopedics;  Laterality: Left;   APPLICATION OF A-CELL OF CHEST/ABDOMEN N/A 05/30/2019   Procedure: Application Of A-Cell Of  Chest;  Surgeon: Lajuana Matte, MD;  Location: [facility];  Service: Cardiothoracic;  Laterality: N/A;   APPLICATION OF WOUND VAC N/A 05/30/2019   Procedure: Application Of Wound Vac;  Surgeon: Lajuana Matte, MD;  Location: [facility];  Service: Cardiothoracic;  Laterality: N/A;   FINGER SURGERY Right 2016   I&D  small finger   I & D EXTREMITY Left 07/30/2018   Procedure: IRRIGATION AND DEBRIDEMENT LEFT FOOT WITH POSSIBLE AMPUTATION OF FIFTH TOE;  Surgeon: Mcarthur Rossetti, MD;  Location: [facility];  Service: Orthopedics;  Laterality: Left;   INCISION AND DRAINAGE ABSCESS Left 01/19/2019   Procedure: INCISION AND DRAINAGE ABSCESS upper chest;  Surgeon: Melida Quitter, MD;  Location: [facility];  Service: ENT;  Laterality: Left;   IRRIGATION AND DEBRIDEMENT STERNOCLAVICULAR JOINT-STERNUM AND RIBS N/A 05/30/2019   Procedure: IRRIGATION AND DEBRIDEMENT OF STERNOCLAVICULAR JOINT-STERNUM AND RIBS ;  Surgeon: Lajuana Matte, MD;  Location: [facility];  Service: Cardiothoracic;  Laterality: N/A;   MINOR IRRIGATION AND DEBRIDEMENT OF WOUND N/A 11/22/2018   Procedure: INCISION AND DRAINAGE OF NECK ABSCESS;  Surgeon: Melida Quitter, MD;  Location: [facility];  Service: ENT;  Laterality: N/A;       Family History  Problem Relation Age of Onset   Diabetes Mother     Social History   Tobacco Use   Smoking status: Never   Smokeless tobacco: Never  Vaping Use   Vaping Use:  Never used  Substance Use Topics   Alcohol use: No    Alcohol/week: 0.0 standard drinks   Drug use: No    Home Medications Prior to Admission medications   Medication Sig Start Date End Date Taking? Authorizing Provider  atorvastatin (LIPITOR) 20 MG tablet Take 1 tablet (20 mg total) by mouth daily. 09/26/20   Charlott Rakes, MD  atorvastatin (LIPITOR) 20 MG tablet TAKE 1 TABLET (20 MG TOTAL) BY MOUTH DAILY. 09/26/20 09/26/21  Charlott Rakes, MD  blood glucose meter kit and supplies KIT Dispense based on patient and insurance  preference. Use up to four times daily as directed. (FOR ICD-9 250.00, 250.01). 06/07/19   Kayleen Memos, DO  Blood Glucose Monitoring Suppl (TRUE METRIX METER) w/Device KIT Use to check blood sugar three times daily. 04/25/21   Charlott Rakes, MD  gabapentin (NEURONTIN) 300 MG capsule Take 1 capsule (300 mg total) by mouth 2 (two) times daily. 12/06/20   Charlott Rakes, MD  gabapentin (NEURONTIN) 300 MG capsule TAKE 1 CAPSULE (300 MG TOTAL) BY MOUTH 2 (TWO) TIMES DAILY. 09/26/20 09/26/21  Charlott Rakes, MD  gabapentin (NEURONTIN) 300 MG capsule TAKE 1 CAPSULE (300 MG TOTAL) BY MOUTH 2 (TWO) TIMES DAILY. 09/04/20 09/04/21  Charlott Rakes, MD  gabapentin (NEURONTIN) 300 MG capsule TAKE 1 CAPSULE (300 MG TOTAL) BY MOUTH 2 (TWO) TIMES DAILY. 08/07/20 08/07/21  Charlott Rakes, MD  gabapentin (NEURONTIN) 300 MG capsule TAKE 1 CAPSULE (300 MG TOTAL) BY MOUTH 2 (TWO) TIMES DAILY. 06/25/20 06/25/21  Charlott Rakes, MD  glucose blood (TRUE METRIX BLOOD GLUCOSE TEST) test strip Use as directed 04/01/21   Charlott Rakes, MD  glucose blood test strip Use as instructed 09/04/20   Newlin, Charlane Ferretti, MD  glucose blood test strip USE AS INSTRUCTED TO CHECK BLOOD SUGAR UP TO 4-5 TIMES DAILY. 07/03/20 07/03/21  Charlott Rakes, MD  HYDROcodone-acetaminophen (NORCO/VICODIN) 5-325 MG tablet Take 1-2 tablets every 6 hours as needed for severe pain Patient not taking: Reported on 09/26/2020 11/12/19   Carlisle Cater, PA-C  ibuprofen (ADVIL) 800 MG tablet Take 1 tablet (800 mg total) by mouth 3 (three) times daily. 12/20/19   Mesner, Corene Cornea, MD  insulin aspart (NOVOLOG FLEXPEN) 100 UNIT/ML FlexPen Inject 0-12 Units into the skin 3 (three) times daily with meals. Per sliding scale 09/26/20   Charlott Rakes, MD  insulin aspart (NOVOLOG FLEXPEN) 100 UNIT/ML FlexPen Inject 0-12 units into the skin three times daily with meals. Per sliding scale. 09/26/20 09/26/21  Charlott Rakes, MD  insulin detemir (LEVEMIR FLEXTOUCH) 100 UNIT/ML  FlexPen Inject 22 Units into the skin at bedtime. 09/26/20   Charlott Rakes, MD  insulin detemir (LEVEMIR FLEXTOUCH) 100 UNIT/ML FlexPen Inject 22units into the skin at bedtime. 09/26/20 09/26/21  Charlott Rakes, MD  insulin detemir (LEVEMIR) 100 UNIT/ML FlexPen INJECT 20 UNITS INTO THE SKIN DAILY. MUST KEEP UPCOMING APPT FOR MORE REFILLS 09/03/20 09/03/21  Charlott Rakes, MD  insulin detemir (LEVEMIR) 100 UNIT/ML FlexPen INJECT 20 UNITS INTO THE SKIN DAILY. MUST KEEP UPCOMING APPT FOR MORE REFILLS 08/07/20 08/07/21  Charlott Rakes, MD  insulin detemir (LEVEMIR) 100 UNIT/ML FlexPen INJECT 20 UNITS INTO THE SKIN DAILY. MUST KEEP UPCOMING APPT FOR MORE REFILLS 07/04/20 07/04/21  Charlott Rakes, MD  Insulin Pen Needle 32G X 5 MM MISC Use with levemir pen as prescribed 11/29/18   [place] Donnamarie Poag, DO  lisinopril-hydrochlorothiazide (ZESTORETIC) 10-12.5 MG tablet Take 1 tablet by mouth daily. 09/26/20   Charlott Rakes, MD  lisinopril-hydrochlorothiazide (ZESTORETIC) 10-12.5 MG  tablet TAKE 1 TABLET BY MOUTH DAILY. 09/26/20 09/26/21  Charlott Rakes, MD  metoprolol tartrate (LOPRESSOR) 50 MG tablet Take 1 tablet (50 mg total) by mouth 2 (two) times daily. 09/26/20 10/26/20  Charlott Rakes, MD  metoprolol tartrate (LOPRESSOR) 50 MG tablet TAKE 1 TABLET (50 MG TOTAL) BY MOUTH 2 (TWO) TIMES DAILY. 09/26/20 09/26/21  Charlott Rakes, MD  Multiple Vitamin (MULTIVITAMIN WITH MINERALS) TABS tablet Take 1 tablet by mouth daily. 06/07/19   Kayleen Memos, DO  penicillin v potassium (VEETID) 500 MG tablet Take 1 tablet (500 mg total) by mouth 3 (three) times daily. 11/12/19   Carlisle Cater, PA-C  TRUEplus Lancets 28G MISC Use to check blood sugar daily. 01/02/21   Charlott Rakes, MD    Allergies    Patient has no known allergies.  Review of Systems   Review of Systems  Endocrine:       Hyperglycemia  All other systems reviewed and are negative.  Physical Exam Updated Vital Signs BP (!) 159/72   Pulse 91   Resp 20    SpO2 100%   Physical Exam Vitals and nursing note reviewed.  Constitutional:      Appearance: He is well-developed. He is obese.  HENT:     Head: Normocephalic and atraumatic.  Eyes:     Conjunctiva/sclera: Conjunctivae normal.     Pupils: Pupils are equal, round, and reactive to light.  Cardiovascular:     Rate and Rhythm: Normal rate and regular rhythm.     Heart sounds: Normal heart sounds.  Pulmonary:     Effort: Pulmonary effort is normal. Tachypnea present. No respiratory distress.     Breath sounds: Normal breath sounds. No rhonchi.     Comments: Tachypneic, kussmaul respirations but protecting airway Abdominal:     General: Bowel sounds are normal.     Palpations: Abdomen is soft.  Musculoskeletal:        General: Normal range of motion.     Cervical back: Normal range of motion.  Skin:    General: Skin is warm and dry.  Neurological:     Mental Status: He is alert.     Comments: Awake, alert, intermittently confused but able to answer some simple questions and follow commands    ED Results / Procedures / Treatments   Labs (all labs ordered are listed, but only abnormal results are displayed) Labs Reviewed  BASIC METABOLIC PANEL - Abnormal; Notable for the following components:      Result Value   Sodium 126 (*)    Potassium 8.2 (*)    Chloride 93 (*)    CO2 <7 (*)    Glucose, Bld 990 (*)    BUN 54 (*)    Calcium 8.6 (*)    All other components within normal limits  CBC WITH DIFFERENTIAL/PLATELET - Abnormal; Notable for the following components:   WBC 22.1 (*)    RBC 3.93 (*)    Hemoglobin 11.4 (*)    MCV 102.0 (*)    MCHC 28.4 (*)    RDW 17.2 (*)    Platelets 426 (*)    All other components within normal limits  BLOOD GAS, VENOUS - Abnormal; Notable for the following components:   pH, Ven 6.800 (*)    pCO2, Ven 12.0 (*)    pO2, Ven 168.0 (*)    Bicarbonate 1.8 (*)    All other components within normal limits  CBG MONITORING, ED - Abnormal;  Notable for the following components:  Glucose-Capillary >600 (*)    All other components within normal limits  RESP PANEL BY RT-PCR (FLU A&B, COVID) ARPGX2  BASIC METABOLIC PANEL  BASIC METABOLIC PANEL  BASIC METABOLIC PANEL  BETA-HYDROXYBUTYRIC ACID  BETA-HYDROXYBUTYRIC ACID  URINALYSIS, ROUTINE W REFLEX MICROSCOPIC  BASIC METABOLIC PANEL  BETA-HYDROXYBUTYRIC ACID  CBG MONITORING, ED    EKG None  Radiology DG Chest Port 1 View  Result Date: 06/04/2021 CLINICAL DATA:  Shortness of breath, unresponsive. EXAM: PORTABLE CHEST 1 VIEW COMPARISON:  June 30, 2019 FINDINGS: Mildly decreased lung volumes are seen. There is no evidence of acute infiltrate, pleural effusion or pneumothorax. Prominence of the right hilar pulmonary vasculature is seen the heart size and mediastinal contours are within normal limits. Chronic right-sided rib fractures are noted. IMPRESSION: Prominence of the right hilar pulmonary vasculature without acute or active cardiopulmonary disease. Electronically Signed   By: Virgina Norfolk M.D.   On: 06/04/2021 23:47    Procedures Procedures   CRITICAL CARE Performed by: Larene Pickett   Total critical care time: 80 minutes  Critical care time was exclusive of separately billable procedures and treating other patients.  Critical care was necessary to treat or prevent imminent or life-threatening deterioration.  Critical care was time spent personally by me on the following activities: development of treatment plan with patient and/or surrogate as well as nursing, discussions with consultants, evaluation of patient's response to treatment, examination of patient, obtaining history from patient or surrogate, ordering and performing treatments and interventions, ordering and review of laboratory studies, ordering and review of radiographic studies, pulse oximetry and re-evaluation of patient's condition.  Procedure note: Ultrasound Guided Peripheral  IV Ultrasound guided peripheral 1.88 inch angiocath IV placement performed by me. Indications: Nursing unable to place IV. Details: The antecubital fossa and upper arm were evaluated with a multifrequency linear probe. Patent brachial veins were noted. 1 attempt was made to cannulate a vein under realtime US guidance with successful cannulation of the vein and catheter placement. There is return of non-pulsatile dark red blood. The patient tolerated the procedure well without complications. Images archived electronically.  CPT codes: [phone] and [phone]    Medications Ordered in ED Medications  insulin regular, human (MYXREDLIN) 100 units/ 100 mL infusion (has no administration in time range)  lactated ringers infusion (has no administration in time range)  dextrose 5 % in lactated ringers infusion (0 mLs Intravenous Hold 06/05/21 0011)  dextrose 50 % solution 0-50 mL (has no administration in time range)  calcium gluconate inj 10% (1 g) URGENT USE ONLY! (has no administration in time range)  sodium bicarbonate 150 mEq in sterile water 1,150 mL infusion (has no administration in time range)  lactated ringers bolus 2,722 mL (2,722 mLs Intravenous New Bag/Given 06/05/21 0010)    ED Course  I have reviewed the triage vital signs and the nursing notes.  Pertinent labs & imaging results that were available during my care of the patient were reviewed by me and considered in my medical decision making (see chart for details).    MDM Rules/Calculators/A&P                           50 y.o. M here with 3 days of URI symptoms so has not been taking usual home medications including his insulin.  CBG reading "HI".  He is tachypneic with kussmaul respirations.  Concern for DKA.  Labs, UA, CXR, covid screen ordered and pending.  Difficulty with  IV access, I have placed 20G US guided IV in right AC.  Patient also hypothermic and placed on bair hugger.  I suspect this is related to his DKA.  Labs with  several critical results-- Glucose 990, K+ 8.2, pH 6.8, bicarb <7, BUN 54, SrCr 2.65.  Already on insulin drip with IVF, given calcium gluconate for hyperkalemia, starting bicarb drip. EKG with some peaked t-waves.  Will need ICU admission.  Discussed with critical care, Dr. Lamonte Sakai-- will admit.  Final Clinical Impression(s) / ED Diagnoses Final diagnoses:  Diabetic ketoacidosis without coma associated with type 2 diabetes mellitus [facility])    Rx / DC Orders ED Discharge Orders     None        Larene Pickett, PA-C 06/05/21 [address], [place], DO 06/05/21 [phone]

## 2021-06-04 NOTE — ED Triage Notes (Signed)
Patient has been feeling sick for days with COVID like symptoms. EMS reports blood sugar of high. Patient has not taken insulin in 3 days. Patient was altered on scene.

## 2021-06-05 ENCOUNTER — Encounter (HOSPITAL_COMMUNITY): Payer: Self-pay | Admitting: Emergency Medicine

## 2021-06-05 ENCOUNTER — Inpatient Hospital Stay (HOSPITAL_COMMUNITY): Payer: Self-pay

## 2021-06-05 ENCOUNTER — Other Ambulatory Visit: Payer: Self-pay

## 2021-06-05 DIAGNOSIS — E1110 Type 2 diabetes mellitus with ketoacidosis without coma: Principal | ICD-10-CM

## 2021-06-05 LAB — CBC WITH DIFFERENTIAL/PLATELET
Abs Immature Granulocytes: 3.67 10*3/uL — ABNORMAL HIGH (ref 0.00–0.07)
Basophils Absolute: 0 10*3/uL (ref 0.0–0.1)
Basophils Relative: 0 %
Eosinophils Absolute: 0 10*3/uL (ref 0.0–0.5)
Eosinophils Relative: 0 %
HCT: 40.1 % (ref 39.0–52.0)
Hemoglobin: 11.4 g/dL — ABNORMAL LOW (ref 13.0–17.0)
Immature Granulocytes: 17 %
Lymphocytes Relative: 7 %
Lymphs Abs: 1.6 10*3/uL (ref 0.7–4.0)
MCH: 29 pg (ref 26.0–34.0)
MCHC: 28.4 g/dL — ABNORMAL LOW (ref 30.0–36.0)
MCV: 102 fL — ABNORMAL HIGH (ref 80.0–100.0)
Monocytes Absolute: 2 10*3/uL — ABNORMAL HIGH (ref 0.1–1.0)
Monocytes Relative: 9 %
Neutro Abs: 14.8 10*3/uL — ABNORMAL HIGH (ref 1.7–7.7)
Neutrophils Relative %: 67 %
Platelets: 426 10*3/uL — ABNORMAL HIGH (ref 150–400)
RBC: 3.93 MIL/uL — ABNORMAL LOW (ref 4.22–5.81)
RDW: 17.2 % — ABNORMAL HIGH (ref 11.5–15.5)
WBC: 22.1 10*3/uL — ABNORMAL HIGH (ref 4.0–10.5)
nRBC: 0 % (ref 0.0–0.2)

## 2021-06-05 LAB — BASIC METABOLIC PANEL
Anion gap: >20 — ABNORMAL HIGH (ref 5–15)
Anion gap: 14 (ref 5–15)
Anion gap: 12 (ref 5–15)
BUN: 53 mg/dL — ABNORMAL HIGH (ref 6–20)
BUN: 54 mg/dL — ABNORMAL HIGH (ref 6–20)
BUN: 55 mg/dL — ABNORMAL HIGH (ref 6–20)
BUN: 56 mg/dL — ABNORMAL HIGH (ref 6–20)
BUN: 57 mg/dL — ABNORMAL HIGH (ref 6–20)
BUN: 57 mg/dL — ABNORMAL HIGH (ref 6–20)
CO2: 14 mmol/L — ABNORMAL LOW (ref 22–32)
CO2: 22 mmol/L (ref 22–32)
CO2: 25 mmol/L (ref 22–32)
CO2: <7 mmol/L — ABNORMAL LOW (ref 22–32)
CO2: <7 mmol/L — ABNORMAL LOW (ref 22–32)
CO2: <7 mmol/L — ABNORMAL LOW (ref 22–32)
Calcium: 8.4 mg/dL — ABNORMAL LOW (ref 8.9–10.3)
Calcium: 8.6 mg/dL — ABNORMAL LOW (ref 8.9–10.3)
Calcium: 8.6 mg/dL — ABNORMAL LOW (ref 8.9–10.3)
Calcium: 8.6 mg/dL — ABNORMAL LOW (ref 8.9–10.3)
Calcium: 8.7 mg/dL — ABNORMAL LOW (ref 8.9–10.3)
Calcium: 8.8 mg/dL — ABNORMAL LOW (ref 8.9–10.3)
Chloride: 100 mmol/L (ref 98–111)
Chloride: 100 mmol/L (ref 98–111)
Chloride: 92 mmol/L — ABNORMAL LOW (ref 98–111)
Chloride: 93 mmol/L — ABNORMAL LOW (ref 98–111)
Chloride: 94 mmol/L — ABNORMAL LOW (ref 98–111)
Chloride: 98 mmol/L (ref 98–111)
Creatinine, Ser: 2.09 mg/dL — ABNORMAL HIGH (ref 0.61–1.24)
Creatinine, Ser: 2.36 mg/dL — ABNORMAL HIGH (ref 0.61–1.24)
Creatinine, Ser: 2.65 mg/dL — ABNORMAL HIGH (ref 0.61–1.24)
Creatinine, Ser: 2.68 mg/dL — ABNORMAL HIGH (ref 0.61–1.24)
Creatinine, Ser: 2.73 mg/dL — ABNORMAL HIGH (ref 0.61–1.24)
Creatinine, Ser: 2.77 mg/dL — ABNORMAL HIGH (ref 0.61–1.24)
GFR, Estimated: 27 mL/min — ABNORMAL LOW (ref >60)
GFR, Estimated: 28 mL/min — ABNORMAL LOW (ref >60)
GFR, Estimated: 28 mL/min — ABNORMAL LOW (ref >60)
GFR, Estimated: 29 mL/min — ABNORMAL LOW (ref >60)
GFR, Estimated: 33 mL/min — ABNORMAL LOW (ref >60)
GFR, Estimated: 38 mL/min — ABNORMAL LOW (ref >60)
Glucose, Bld: 306 mg/dL — ABNORMAL HIGH (ref 70–99)
Glucose, Bld: 406 mg/dL — ABNORMAL HIGH (ref 70–99)
Glucose, Bld: 611 mg/dL (ref 70–99)
Glucose, Bld: 843 mg/dL (ref 70–99)
Glucose, Bld: 917 mg/dL (ref 70–99)
Glucose, Bld: 990 mg/dL (ref 70–99)
Potassium: 3.2 mmol/L — ABNORMAL LOW (ref 3.5–5.1)
Potassium: 3.6 mmol/L (ref 3.5–5.1)
Potassium: 4.4 mmol/L (ref 3.5–5.1)
Potassium: 5.8 mmol/L — ABNORMAL HIGH (ref 3.5–5.1)
Potassium: 6.2 mmol/L — ABNORMAL HIGH (ref 3.5–5.1)
Potassium: 8.2 mmol/L (ref 3.5–5.1)
Sodium: 125 mmol/L — ABNORMAL LOW (ref 135–145)
Sodium: 126 mmol/L — ABNORMAL LOW (ref 135–145)
Sodium: 126 mmol/L — ABNORMAL LOW (ref 135–145)
Sodium: 133 mmol/L — ABNORMAL LOW (ref 135–145)
Sodium: 136 mmol/L (ref 135–145)
Sodium: 137 mmol/L (ref 135–145)

## 2021-06-05 LAB — RESPIRATORY PANEL BY PCR

## 2021-06-05 LAB — RESP PANEL BY RT-PCR (FLU A&B, COVID) ARPGX2
Influenza A by PCR: NEGATIVE
Influenza B by PCR: NEGATIVE
SARS Coronavirus 2 by RT PCR: NEGATIVE

## 2021-06-05 LAB — GLUCOSE, CAPILLARY
Glucose-Capillary: >600 mg/dL (ref 70–99)
Glucose-Capillary: >600 mg/dL (ref 70–99)
Glucose-Capillary: >600 mg/dL (ref 70–99)
Glucose-Capillary: >600 mg/dL (ref 70–99)
Glucose-Capillary: >600 mg/dL (ref 70–99)
Glucose-Capillary: >600 mg/dL (ref 70–99)
Glucose-Capillary: >600 mg/dL (ref 70–99)
Glucose-Capillary: >600 mg/dL (ref 70–99)
Glucose-Capillary: >600 mg/dL (ref 70–99)
Glucose-Capillary: >600 mg/dL (ref 70–99)
Glucose-Capillary: 590 mg/dL (ref 70–99)
Glucose-Capillary: 595 mg/dL (ref 70–99)
Glucose-Capillary: 524 mg/dL (ref 70–99)
Glucose-Capillary: 557 mg/dL (ref 70–99)
Glucose-Capillary: 502 mg/dL (ref 70–99)
Glucose-Capillary: 486 mg/dL — ABNORMAL HIGH (ref 70–99)
Glucose-Capillary: 534 mg/dL (ref 70–99)
Glucose-Capillary: 434 mg/dL — ABNORMAL HIGH (ref 70–99)
Glucose-Capillary: 450 mg/dL — ABNORMAL HIGH (ref 70–99)
Glucose-Capillary: 389 mg/dL — ABNORMAL HIGH (ref 70–99)
Glucose-Capillary: 341 mg/dL — ABNORMAL HIGH (ref 70–99)
Glucose-Capillary: 270 mg/dL — ABNORMAL HIGH (ref 70–99)
Glucose-Capillary: 279 mg/dL — ABNORMAL HIGH (ref 70–99)
Glucose-Capillary: 293 mg/dL — ABNORMAL HIGH (ref 70–99)
Glucose-Capillary: 285 mg/dL — ABNORMAL HIGH (ref 70–99)
Glucose-Capillary: 240 mg/dL — ABNORMAL HIGH (ref 70–99)
Glucose-Capillary: 199 mg/dL — ABNORMAL HIGH (ref 70–99)

## 2021-06-05 LAB — URINALYSIS, ROUTINE W REFLEX MICROSCOPIC
Bilirubin Urine: NEGATIVE
Glucose, UA: >=500 mg/dL — AB
Ketones, ur: 20 mg/dL — AB
Leukocytes,Ua: NEGATIVE
Nitrite: NEGATIVE
Protein, ur: 100 mg/dL — AB
Specific Gravity, Urine: 1.018 (ref 1.005–1.030)
pH: 5 (ref 5.0–8.0)

## 2021-06-05 LAB — HIV ANTIBODY (ROUTINE TESTING W REFLEX): HIV Screen 4th Generation wRfx: NONREACTIVE

## 2021-06-05 LAB — PROCALCITONIN: Procalcitonin: 0.6 ng/mL

## 2021-06-05 LAB — CBC
HCT: 36.4 % — ABNORMAL LOW (ref 39.0–52.0)
Hemoglobin: 10.8 g/dL — ABNORMAL LOW (ref 13.0–17.0)
MCH: 29.5 pg (ref 26.0–34.0)
MCHC: 29.7 g/dL — ABNORMAL LOW (ref 30.0–36.0)
MCV: 99.5 fL (ref 80.0–100.0)
Platelets: 381 10*3/uL (ref 150–400)
RBC: 3.66 MIL/uL — ABNORMAL LOW (ref 4.22–5.81)
RDW: 17 % — ABNORMAL HIGH (ref 11.5–15.5)
WBC: 29.9 10*3/uL — ABNORMAL HIGH (ref 4.0–10.5)
nRBC: 0 % (ref 0.0–0.2)

## 2021-06-05 LAB — AMYLASE: Amylase: 266 U/L — ABNORMAL HIGH (ref 28–100)

## 2021-06-05 LAB — LIPASE, BLOOD: Lipase: 145 U/L — ABNORMAL HIGH (ref 11–51)

## 2021-06-05 LAB — MRSA NEXT GEN BY PCR, NASAL: MRSA by PCR Next Gen: NOT DETECTED

## 2021-06-05 LAB — CBG MONITORING, ED
Glucose-Capillary: >600 mg/dL (ref 70–99)
Glucose-Capillary: >600 mg/dL (ref 70–99)
Glucose-Capillary: >600 mg/dL (ref 70–99)

## 2021-06-05 LAB — LACTIC ACID, PLASMA: Lactic Acid, Venous: 1.9 mmol/L (ref 0.5–1.9)

## 2021-06-05 LAB — BETA-HYDROXYBUTYRIC ACID
Beta-Hydroxybutyric Acid: 0.66 mmol/L — ABNORMAL HIGH (ref 0.05–0.27)
Beta-Hydroxybutyric Acid: 15.43 mmol/L — ABNORMAL HIGH (ref 0.05–0.27)
Beta-Hydroxybutyric Acid: >8 mmol/L — ABNORMAL HIGH (ref 0.05–0.27)

## 2021-06-05 MED ORDER — DOCUSATE SODIUM 100 MG PO CAPS: 100.0000 mg | ORAL_CAPSULE | Freq: Two times a day (BID) | ORAL | Status: DC | PRN

## 2021-06-05 MED ORDER — HYDRALAZINE HCL 20 MG/ML IJ SOLN
10.0000 mg | Freq: Four times a day (QID) | INTRAMUSCULAR | Status: DC | PRN
  Administered 2021-06-05 – 2021-06-06 (×3): 10 mg via INTRAVENOUS
  Filled 2021-06-05 (×3): qty 1

## 2021-06-05 MED ORDER — ONDANSETRON HCL 4 MG/2ML IJ SOLN
4.0000 mg | Freq: Four times a day (QID) | INTRAMUSCULAR | Status: DC | PRN
  Administered 2021-06-05 – 2021-06-09 (×7): 4 mg via INTRAVENOUS
  Filled 2021-06-05 (×8): qty 2

## 2021-06-05 MED ORDER — LACTATED RINGERS IV SOLN: INTRAVENOUS | Status: DC

## 2021-06-05 MED ORDER — LABETALOL HCL 5 MG/ML IV SOLN
10.0000 mg | INTRAVENOUS | Status: DC | PRN
  Administered 2021-06-05: 10 mg via INTRAVENOUS
  Administered 2021-06-06 (×2): 20 mg via INTRAVENOUS
  Filled 2021-06-05 (×4): qty 4

## 2021-06-05 MED ORDER — METOPROLOL TARTRATE 25 MG PO TABS
25.0000 mg | ORAL_TABLET | Freq: Two times a day (BID) | ORAL | Status: DC
  Administered 2021-06-05 (×2): 25 mg via ORAL
  Filled 2021-06-05 (×2): qty 1

## 2021-06-05 MED ORDER — LACTATED RINGERS IV BOLUS
500.0000 mL | Freq: Once | INTRAVENOUS | Status: AC
  Administered 2021-06-05: 500 mL via INTRAVENOUS

## 2021-06-05 MED ORDER — ORAL CARE MOUTH RINSE
15.0000 mL | Freq: Two times a day (BID) | OROMUCOSAL | Status: DC
  Administered 2021-06-05 – 2021-06-09 (×8): 15 mL via OROMUCOSAL

## 2021-06-05 MED ORDER — HEPARIN SODIUM (PORCINE) 5000 UNIT/ML IJ SOLN
5000.0000 [IU] | Freq: Three times a day (TID) | INTRAMUSCULAR | Status: DC
  Administered 2021-06-05 – 2021-06-10 (×16): 5000 [IU] via SUBCUTANEOUS
  Filled 2021-06-05 (×16): qty 1

## 2021-06-05 MED ORDER — PANTOPRAZOLE SODIUM 40 MG IV SOLR
40.0000 mg | Freq: Every day | INTRAVENOUS | Status: DC
  Administered 2021-06-05 – 2021-06-08 (×5): 40 mg via INTRAVENOUS
  Filled 2021-06-05 (×5): qty 40

## 2021-06-05 MED ORDER — CALCIUM GLUCONATE 10 % IV SOLN: 1.0000 g | Freq: Once | INTRAVENOUS | Status: DC

## 2021-06-05 MED ORDER — GABAPENTIN 100 MG PO CAPS
200.0000 mg | ORAL_CAPSULE | Freq: Once | ORAL | Status: AC
  Administered 2021-06-05: 200 mg via ORAL
  Filled 2021-06-05: qty 2

## 2021-06-05 MED ORDER — STERILE WATER FOR INJECTION IV SOLN
INTRAVENOUS | Status: DC
  Filled 2021-06-05 (×2): qty 150
  Filled 2021-06-05: qty 1000
  Filled 2021-06-05: qty 150
  Filled 2021-06-05: qty 1000

## 2021-06-05 MED ORDER — POLYETHYLENE GLYCOL 3350 17 G PO PACK: 17.0000 g | PACK | Freq: Every day | ORAL | Status: DC | PRN

## 2021-06-05 MED ORDER — SODIUM ZIRCONIUM CYCLOSILICATE 10 G PO PACK
10.0000 g | PACK | Freq: Once | ORAL | Status: AC
  Administered 2021-06-05: 10 g via ORAL
  Filled 2021-06-05: qty 1

## 2021-06-05 MED ORDER — SODIUM BICARBONATE 8.4 % IV SOLN
100.0000 meq | Freq: Once | INTRAVENOUS | Status: AC
  Administered 2021-06-05: 100 meq via INTRAVENOUS
  Filled 2021-06-05: qty 100
  Filled 2021-06-05: qty 50

## 2021-06-05 MED ORDER — CALCIUM GLUCONATE 10 % IV SOLN
1.0000 g | Freq: Once | INTRAVENOUS | Status: AC
  Administered 2021-06-05: 1 g via INTRAVENOUS
  Filled 2021-06-05: qty 10

## 2021-06-05 MED ORDER — CALCIUM GLUCONATE-NACL 1-0.675 GM/50ML-% IV SOLN
1.0000 g | Freq: Once | INTRAVENOUS | Status: AC
  Administered 2021-06-05: 1000 mg via INTRAVENOUS
  Filled 2021-06-05: qty 50

## 2021-06-05 MED ORDER — PHENOL 1.4 % MT LIQD
1.0000 | OROMUCOSAL | Status: DC | PRN
  Administered 2021-06-05 – 2021-06-07 (×4): 1 via OROMUCOSAL
  Filled 2021-06-05: qty 177

## 2021-06-05 MED ORDER — SODIUM CHLORIDE 0.9 % IV SOLN
2.0000 g | INTRAVENOUS | Status: DC
  Administered 2021-06-05 – 2021-06-07 (×3): 2 g via INTRAVENOUS
  Filled 2021-06-05 (×3): qty 20

## 2021-06-05 MED ORDER — CHLORHEXIDINE GLUCONATE CLOTH 2 % EX PADS
6.0000 | MEDICATED_PAD | Freq: Every day | CUTANEOUS | Status: DC
  Administered 2021-06-05 – 2021-06-08 (×4): 6 via TOPICAL

## 2021-06-05 NOTE — H&P (Signed)
NAME:  Taylor Hughes, MRN:  [id number], DOB:  Jul 01, 1971, LOS: 0 ADMISSION DATE:  06/04/2021, CHIEF COMPLAINT: Encephalopathy, hyperglycemia  History of Present Illness:  50 year old man with a history of insulin-dependent diabetes, atrial fibrillation not on anticoagulation, GERD, obesity.  Per his sister has been experiencing some URI symptoms, patient states for over a week.  Family has noticed tachypnea for about 3 days.  He has not been taking his usual home medications including insulin for the last 3 days.  He presented with confusion, tachypnea.  Labs reveal profound metabolic acidosis and hyperglycemia, acute renal insufficiency with hyperkalemia (K+ 8.2), consistent with severe DKA.  ECG with peaked T waves. Has been started on IV fluids, bicarbonate infusion, insulin infusion, received calcium gluconate.  Pertinent  Medical History   Past Medical History:  Diagnosis Date   Atrial fibrillation ([place])    Diabetes mellitus without complication (HCC)    Type 2   GERD (gastroesophageal reflux disease)    Wears glasses    Wound, open, foot    left diabetic     Significant Hospital Events: Including procedures, antibiotic start and stop dates in addition to other pertinent events     Interim History / Subjective:  Per his sister at bedside patient is more awake, interacting more Sodium bicarbonate infusion running LR running, insulin infusion running   Objective   Blood pressure (!) 169/83, pulse 94, temperature (!) 92.4 F (33.6 C), temperature source Rectal, resp. rate (!) 21, height _0  (1.803 m), weight 136.1 kg, SpO2 100 %.       No intake or output data in the 24 hours ending 06/05/21 0140 Filed Weights   06/04/21 2342  Weight: 136.1 kg    Examination: General: Critically ill-appearing obese man, laying in bed, tachypneic HENT: Oropharynx dry, poor dentition, pupils equal Lungs: Clear bilaterally, tachypnea, Kussmaul breathing pattern Cardiovascular: Regular,  distant, no murmur Abdomen: Obese, nondistended, positive bowel sounds, nontender Extremities: Trace pretibial edema Neuro: Awake, answer simple questions.  Oriented only to self, not place or time.  Follows commands, moves extremities GU: External catheter in place  Resolved Hospital Problem list     Assessment & Plan:   Severe DKA, likely precipitated by URI and medical noncompliance. -Aggressive IV fluid resuscitation -Insulin infusion as per DKA protocol -Follow BMP and anion gap, CO2 for correction -N.p.o. -Rule out any precipitating infectious process, await UA.  Obtain blood cultures.  Chest x-ray reassuring -Transition back to usual insulin regimen after correction of metabolic disarray  Severe hyperkalemia due to the above, associated with peaked T waves -Correct severe acidosis -Follow BMP, K+ -Calcium supplementation -Temporizing medical treatment with Lokelma and/or Kayexalate -Follow ECG  Acute renal failure, due to ATN and hypoperfusion -Follow urine output, BMP with volume resuscitation and correction of acidosis  Hypertension -Hold lisinopril/HCTZ in the setting of his renal failure -Restart metoprolol 50 mg twice daily when he is able to take oral medications -Hydralazine IV as needed until able to take oral meds  Atrial fibrillation -Not on baseline anticoagulation -Follow telemetry -Metoprolol IV for rate control if needed   Best Practice (right click and "Reselect all SmartList Selections" daily)   Diet/type: NPO DVT prophylaxis: prophylactic heparin  GI prophylaxis: PPI Lines: N/A Foley:  N/A Code Status:  full code Last date of multidisciplinary goals of care discussion [pending] Family: Discussed patient's status, issues and medical plan with his sister at bedside 06/05/2021  Labs   CBC: Recent Labs  Lab 06/04/21 2331  WBC  22.1*  NEUTROABS 14.8*  HGB 11.4*  HCT 40.1  MCV 102.0*  PLT 426*    Basic Metabolic Panel: Recent Labs   Lab 06/04/21 2331  NA 126*  K 8.2*  CL 93*  CO2 <7*  GLUCOSE 990*  BUN 54*  CREATININE 2.65*  CALCIUM 8.6*   GFR: Estimated Creatinine Clearance: 47.5 mL/min (A) (by C-G formula based on SCr of 2.65 mg/dL (H)). Recent Labs  Lab 06/04/21 2331  WBC 22.1*    Liver Function Tests: No results for input(s): AST, ALT, ALKPHOS, BILITOT, PROT, ALBUMIN in the last 168 hours. No results for input(s): LIPASE, AMYLASE in the last 168 hours. No results for input(s): AMMONIA in the last 168 hours.  ABG    Component Value Date/Time   HCO3 1.8 (L) 06/04/2021 2331   TCO2 28 12/17/2018 2150   ACIDBASEDEF 17.6 (H) 11/22/2018 1049   O2SAT 97.6 06/04/2021 2331     Coagulation Profile: No results for input(s): INR, PROTIME in the last 168 hours.  Cardiac Enzymes: No results for input(s): CKTOTAL, CKMB, CKMBINDEX, TROPONINI in the last 168 hours.  HbA1C: Hgb A1c MFr Bld  Date/Time Value Ref Range Status  05/27/2019 12:12 PM 12.7 (H) 4.8 - 5.6 % Final    Comment:    (NOTE) Pre diabetes:          5.7%-6.4% Diabetes:              >6.4% Glycemic control for   <7.0% adults with diabetes   11/25/2018 05:25 AM 11.4 (H) 4.8 - 5.6 % Final    Comment:    (NOTE) Pre diabetes:          5.7%-6.4% Diabetes:              >6.4% Glycemic control for   <7.0% adults with diabetes     CBG: Recent Labs  Lab 06/04/21 2306 06/05/21 0134  GLUCAP >600* >600*    Review of Systems:   As per HPI  Past Medical History:  He,  has a past medical history of Atrial fibrillation ([place]), Diabetes mellitus without complication ([place]), GERD (gastroesophageal reflux disease), Wears glasses, and Wound, open, foot.   Surgical History:   Past Surgical History:  Procedure Laterality Date   AMPUTATION Left 08/20/2018   Procedure: LEFT FOOT IRRIGATON AND DEBRIDEMENT, 5TH RAY AMPUTATION;  Surgeon: Newt Minion, MD;  Location: [facility];  Service: Orthopedics;  Laterality: Left;   APPLICATION OF A-CELL OF  CHEST/ABDOMEN N/A 05/30/2019   Procedure: Application Of A-Cell Of Chest;  Surgeon: Lajuana Matte, MD;  Location: [facility];  Service: Cardiothoracic;  Laterality: N/A;   APPLICATION OF WOUND VAC N/A 05/30/2019   Procedure: Application Of Wound Vac;  Surgeon: Lajuana Matte, MD;  Location: [facility];  Service: Cardiothoracic;  Laterality: N/A;   FINGER SURGERY Right 2016   I&D  small finger   I & D EXTREMITY Left 07/30/2018   Procedure: IRRIGATION AND DEBRIDEMENT LEFT FOOT WITH POSSIBLE AMPUTATION OF FIFTH TOE;  Surgeon: Mcarthur Rossetti, MD;  Location: [facility];  Service: Orthopedics;  Laterality: Left;   INCISION AND DRAINAGE ABSCESS Left 01/19/2019   Procedure: INCISION AND DRAINAGE ABSCESS upper chest;  Surgeon: Melida Quitter, MD;  Location: [facility];  Service: ENT;  Laterality: Left;   IRRIGATION AND DEBRIDEMENT STERNOCLAVICULAR JOINT-STERNUM AND RIBS N/A 05/30/2019   Procedure: IRRIGATION AND DEBRIDEMENT OF STERNOCLAVICULAR JOINT-STERNUM AND RIBS ;  Surgeon: Lajuana Matte, MD;  Location: [facility];  Service: Cardiothoracic;  Laterality: N/A;   MINOR IRRIGATION AND DEBRIDEMENT OF WOUND N/A 11/22/2018   Procedure: INCISION AND DRAINAGE OF NECK ABSCESS;  Surgeon: Melida Quitter, MD;  Location: [facility];  Service: ENT;  Laterality: N/A;     Social History:   reports that he has never smoked. He has never used smokeless tobacco. He reports that he does not drink alcohol and does not use drugs.   Family History:  His family history includes Diabetes in his mother.   Allergies No Known Allergies   Home Medications  Prior to Admission medications   Medication Sig Start Date End Date Taking? Authorizing Provider  atorvastatin (LIPITOR) 20 MG tablet Take 1 tablet (20 mg total) by mouth daily. 09/26/20   Charlott Rakes, MD  atorvastatin (LIPITOR) 20 MG tablet TAKE 1 TABLET (20 MG TOTAL) BY MOUTH DAILY. 09/26/20 09/26/21  Charlott Rakes, MD  blood glucose meter kit and supplies KIT  Dispense based on patient and insurance preference. Use up to four times daily as directed. (FOR ICD-9 250.00, 250.01). 06/07/19   Kayleen Memos, DO  Blood Glucose Monitoring Suppl (TRUE METRIX METER) w/Device KIT Use to check blood sugar three times daily. 04/25/21   Charlott Rakes, MD  gabapentin (NEURONTIN) 300 MG capsule Take 1 capsule (300 mg total) by mouth 2 (two) times daily. 12/06/20   Charlott Rakes, MD  gabapentin (NEURONTIN) 300 MG capsule TAKE 1 CAPSULE (300 MG TOTAL) BY MOUTH 2 (TWO) TIMES DAILY. 09/26/20 09/26/21  Charlott Rakes, MD  gabapentin (NEURONTIN) 300 MG capsule TAKE 1 CAPSULE (300 MG TOTAL) BY MOUTH 2 (TWO) TIMES DAILY. 09/04/20 09/04/21  Charlott Rakes, MD  gabapentin (NEURONTIN) 300 MG capsule TAKE 1 CAPSULE (300 MG TOTAL) BY MOUTH 2 (TWO) TIMES DAILY. 08/07/20 08/07/21  Charlott Rakes, MD  gabapentin (NEURONTIN) 300 MG capsule TAKE 1 CAPSULE (300 MG TOTAL) BY MOUTH 2 (TWO) TIMES DAILY. 06/25/20 06/25/21  Charlott Rakes, MD  glucose blood (TRUE METRIX BLOOD GLUCOSE TEST) test strip Use as directed 04/01/21   Charlott Rakes, MD  glucose blood test strip Use as instructed 09/04/20   Newlin, Charlane Ferretti, MD  glucose blood test strip USE AS INSTRUCTED TO CHECK BLOOD SUGAR UP TO 4-5 TIMES DAILY. 07/03/20 07/03/21  Charlott Rakes, MD  HYDROcodone-acetaminophen (NORCO/VICODIN) 5-325 MG tablet Take 1-2 tablets every 6 hours as needed for severe pain Patient not taking: Reported on 09/26/2020 11/12/19   Carlisle Cater, PA-C  ibuprofen (ADVIL) 800 MG tablet Take 1 tablet (800 mg total) by mouth 3 (three) times daily. 12/20/19   Mesner, Corene Cornea, MD  insulin aspart (NOVOLOG FLEXPEN) 100 UNIT/ML FlexPen Inject 0-12 Units into the skin 3 (three) times daily with meals. Per sliding scale 09/26/20   Charlott Rakes, MD  insulin aspart (NOVOLOG FLEXPEN) 100 UNIT/ML FlexPen Inject 0-12 units into the skin three times daily with meals. Per sliding scale. 09/26/20 09/26/21  Charlott Rakes, MD  insulin  detemir (LEVEMIR FLEXTOUCH) 100 UNIT/ML FlexPen Inject 22 Units into the skin at bedtime. 09/26/20   Charlott Rakes, MD  insulin detemir (LEVEMIR FLEXTOUCH) 100 UNIT/ML FlexPen Inject 22units into the skin at bedtime. 09/26/20 09/26/21  Charlott Rakes, MD  insulin detemir (LEVEMIR) 100 UNIT/ML FlexPen INJECT 20 UNITS INTO THE SKIN DAILY. MUST KEEP UPCOMING APPT FOR MORE REFILLS Patient not taking: No sig reported 09/03/20 09/03/21  Charlott Rakes, MD  insulin detemir (LEVEMIR) 100 UNIT/ML FlexPen INJECT 20 UNITS INTO THE SKIN DAILY. MUST KEEP UPCOMING APPT FOR MORE REFILLS 08/07/20 08/07/21  Charlott Rakes, MD  insulin detemir (LEVEMIR) 100 UNIT/ML FlexPen INJECT 20 UNITS INTO THE SKIN DAILY. MUST KEEP UPCOMING APPT FOR MORE REFILLS 07/04/20 07/04/21  Charlott Rakes, MD  Insulin Pen Needle 32G X 5 MM MISC Use with levemir pen as prescribed 11/29/18   [place] Donnamarie Poag, DO  lisinopril-hydrochlorothiazide (ZESTORETIC) 10-12.5 MG tablet Take 1 tablet by mouth daily. 09/26/20   Charlott Rakes, MD  lisinopril-hydrochlorothiazide (ZESTORETIC) 10-12.5 MG tablet TAKE 1 TABLET BY MOUTH DAILY. 09/26/20 09/26/21  Charlott Rakes, MD  metoprolol tartrate (LOPRESSOR) 50 MG tablet Take 1 tablet (50 mg total) by mouth 2 (two) times daily. 09/26/20 10/26/20  Charlott Rakes, MD  metoprolol tartrate (LOPRESSOR) 50 MG tablet TAKE 1 TABLET (50 MG TOTAL) BY MOUTH 2 (TWO) TIMES DAILY. Patient taking differently: Take 50 mg by mouth 2 (two) times daily. 09/26/20 09/26/21  Charlott Rakes, MD  Multiple Vitamin (MULTIVITAMIN WITH MINERALS) TABS tablet Take 1 tablet by mouth daily. 06/07/19   Kayleen Memos, DO  TRUEplus Lancets 28G MISC Use to check blood sugar daily. 01/02/21   Charlott Rakes, MD     Critical care time: 77 minutes     Baltazar Apo, MD, PhD 06/05/2021, 2:11 AM [facility] [phone] or if no answer before 7:00PM call [phone] For any issues after 7:00PM please call eLink  [phone]

## 2021-06-05 NOTE — ED Notes (Signed)
IV team contacted per RN request

## 2021-06-05 NOTE — ED Notes (Signed)
Patient is responsive to name. Patient is altered an d uses in comprehensible words. Patient vocalizes to pain and some questions however speech is unclear.

## 2021-06-05 NOTE — ED Notes (Signed)
Patient is on [facility].

## 2021-06-05 NOTE — Progress Notes (Addendum)
eLink Physician-Brief Progress Note Patient Name: Brok Stocking DOB: [birth year] MRN: [id number]   Date of Service  06/05/2021  HPI/Events of Note  Request for Neurontin as he takes it at home  eICU Interventions  One time dose ordered - lower dose needed given his AKI. Please address further doses in AM  Also do not see repeat labs since this afternoon. Multiple orders for fluids and insulin noted, will clarify and order labs if still on insulin drip      Intervention Category Intermediate Interventions: Pain - evaluation and management  Odis Wickey G Barry Faircloth 06/05/2021, 9:23 PM  Addendum at 930 pm Patient still on insulin drip, bicarb drip and D5LR  BMP ordered and will need serial checks   Addendum at 10:30 pm BMP resulted Bicarb is 25, stop IV bicarb Use LR when glucose more than 250, switch to d5LR when less than 250 - I discontinued multiple duplicate orders Also ordered next BMP for 1 am, call us with results  Addendum 330 am 1 am BMP has not resulted yet. Awaiting call back when resulted.   Addendum: Labs resulted now D/w RN Due to nausea and vomiting, he has not had anything to eat and was asked to stay NPO I think we could take him off the IV insulin once he is cleared for eating probably this AM Currently is on very low dose IV insulin and D5LR, and making great urine 40 meq IV kcl ordered Repeat labs ordered for 9 am with beta hydroxy butyrate level - last was 0.66  EMR allows q4 BMP to be ordered only for 5 checks - further orders will be needed if he remains on the drip

## 2021-06-05 NOTE — Progress Notes (Signed)
NAME:  Taylor Hughes, MRN:  [id number], DOB:  19-Jul-1971, LOS: 0 ADMISSION DATE:  06/04/2021, CHIEF COMPLAINT: Encephalopathy, hyperglycemia  History of Present Illness:  50 year old man with a history of insulin-dependent diabetes, atrial fibrillation not on anticoagulation, GERD, obesity.  Per his sister has been experiencing some URI symptoms, patient states for over a week.  Family has noticed tachypnea for about 3 days.  He has not been taking his usual home medications including insulin for the last 3 days.  He presented with confusion, tachypnea.  Labs reveal profound metabolic acidosis and hyperglycemia, acute renal insufficiency with hyperkalemia (K+ 8.2), consistent with severe DKA.  ECG with peaked T waves.  Admitted & started on IV fluids, bicarbonate infusion, insulin infusion, received calcium gluconate.  Pertinent  Medical History  AF  DM - prior DKA  GERD  Diabetic Left Foot Wound  Wears Glasses  Obesity - BMI 30 Neck Abscess  Hx of Group B Strep Sepsis HTN   Significant Hospital Events: Including procedures, antibiotic start and stop dates in addition to other pertinent events   10/25 Admit with DKA, AMS  Interim History / Subjective:  Temp 93.9 > rewarmed to 98.4 with bair hugger 2.7L positive thus far for I/O Lab glucose - 843, K+ down to 5.8, Sr Cr 2.78 Pt asking for water.  Denies pain.  Objective   Blood pressure (!) 158/66, pulse (!) 104, temperature 98.4 F (36.9 C), temperature source Axillary, resp. rate (!) 22, height 5\' 11"  (1.803 m), weight 93.7 kg, SpO2 99 %.        Intake/Output Summary (Last 24 hours) at 06/05/2021 1038 Last data filed at 06/05/2021 1021 Gross per 24 hour  Intake 4483.38 ml  Output 100 ml  Net 4383.38 ml   Filed Weights   06/04/21 2342 06/05/21 0500  Weight: 136.1 kg 93.7 kg    Examination: General: chronically ill appearing adult male lying in bed in NAD HEENT: MM pink/very dry mucus membranes, anicteric Neuro: Awakens  to voice, speech clear, MAE / generalized weakness. Non-focal.  CV: s1s2 RRR, no m/r/g PULM: non-labored at rest, lungs bilaterally clear, anterior left chest scar, old trach scar GI: soft, bsx4 active  Extremities: warm/dry, BLE 2+ pitting edema, LLE toe amputations noted Skin: no rashes or lesions  Resolved Hospital Problem list     Assessment & Plan:   Severe DKA Likely precipitated by URI and medical noncompliance. -additional LR bolus now, may need more IVF given glucose  -continue DKA protocol  -follow BMP, anion gap and beta-hydroxybutyric acid  -NPO x ice chips  -empiric ceftriaxone with left shift, assess PCT. Low threshold to stop if negative.  -follow cultures, CXR in am   Severe Hyperkalemia  Due to the above, initially associated with peaked T waves.  T waves improved am of 10/26. -additional IVF as above  -follow AG  -BMP Q4   Acute Metabolic Encephalopathy  In setting of severe metabolic derangements, DKA -correct underlying causes / slow correction of glucose  -follow neuro exam   Acute renal failure, due to ATN and hypoperfusion AG Metabolic Acidosis  -continue bicarbonate  -Trend BMP / urinary output -Replace electrolytes as indicated -Avoid nephrotoxic agents, ensure adequate renal perfusion  Hypertension -hold home lisinopril / HCTZ  -resume home lopressor at 1/2 home dose 10/26, consider increase 10/27 to baseline dosing  -PRN hydralazine IV  Atrial fibrillation Not on baseline anticoagulation -tele monitoring  -resume lopressor as above, rate control    Best Practice (right click  and "Reselect all SmartList Selections" daily)  Diet/type: NPO DVT prophylaxis: prophylactic heparin  GI prophylaxis: PPI Lines: N/A Foley:  N/A Code Status:  full code Last date of multidisciplinary goals of care discussion- pending Family: Discussed patient's status, issues and medical plan with his sister at bedside 06/05/2021  Critical care time: 34  minutes   Noe Gens, MSN, APRN, NP-C, AGACNP-BC [facility] 06/05/2021, 10:38 AM   Please see [email or web address] for pager details.   From 7A-7P if no response, please call [phone] After hours, please call ELink [phone]

## 2021-06-05 NOTE — Progress Notes (Incomplete)
Inpatient Diabetes Program Recommendations  AACE/ADA: New Consensus Statement on Inpatient Glycemic Control (2015)  Target Ranges:  Prepandial:   less than 140 mg/dL      Peak postprandial:   less than 180 mg/dL (1-2 hours)      Critically ill patients:  140 - 180 mg/dL   Lab Results  Component Value Date   OKHTXH 741 ([place]) 06/05/2021   HGBA1C 12.7 (H) 05/27/2019    Review of Glycemic Control  Diabetes history: DM2 Outpatient Diabetes medications: Levemir 22 units hs, Novolog 0-12 units tid meal coverage Current orders for Inpatient glycemic control: IV insulin  Inpatient Diabetes Program Recommendations:   Please consider: -A1c Noted per notes, patient has not been taking insulin while sick. Will plan to speak with pt.  Thank you, Nani Gasser. Chasten Blaze, RN, MSN, CDE  Diabetes Coordinator Inpatient Glycemic Control Team Team Pager [phone] (8am-5pm) 06/05/2021 10:54 AM

## 2021-06-05 NOTE — Progress Notes (Signed)
[facility] Progress Note Patient Name: Taylor Hughes DOB: [birth year] MRN: [id number]   Date of Service  06/05/2021  HPI/Events of Note  Hyperkalemia - K+ = 6.2. Patient is already on an insulin IV infusion for DKA and has received Lokelma.   eICU Interventions  Plan: Ca++ 1 gm IV now. NaHCO3 100 meq IV now.  Continue to trend K+..     Intervention Category Major Interventions: Electrolyte abnormality - evaluation and management  Cylinda Santoli [place] 06/05/2021, 4:06 AM

## 2021-06-06 ENCOUNTER — Inpatient Hospital Stay (HOSPITAL_COMMUNITY): Payer: Self-pay

## 2021-06-06 DIAGNOSIS — G9340 Encephalopathy, unspecified: Secondary | ICD-10-CM

## 2021-06-06 DIAGNOSIS — N179 Acute kidney failure, unspecified: Secondary | ICD-10-CM

## 2021-06-06 DIAGNOSIS — E1111 Type 2 diabetes mellitus with ketoacidosis with coma: Secondary | ICD-10-CM

## 2021-06-06 DIAGNOSIS — E1100 Type 2 diabetes mellitus with hyperosmolarity without nonketotic hyperglycemic-hyperosmolar coma (NKHHC): Secondary | ICD-10-CM

## 2021-06-06 LAB — GLUCOSE, CAPILLARY
Glucose-Capillary: 105 mg/dL — ABNORMAL HIGH (ref 70–99)
Glucose-Capillary: 144 mg/dL — ABNORMAL HIGH (ref 70–99)
Glucose-Capillary: 159 mg/dL — ABNORMAL HIGH (ref 70–99)
Glucose-Capillary: 174 mg/dL — ABNORMAL HIGH (ref 70–99)
Glucose-Capillary: 194 mg/dL — ABNORMAL HIGH (ref 70–99)
Glucose-Capillary: 260 mg/dL — ABNORMAL HIGH (ref 70–99)
Glucose-Capillary: 337 mg/dL — ABNORMAL HIGH (ref 70–99)
Glucose-Capillary: 344 mg/dL — ABNORMAL HIGH (ref 70–99)
Glucose-Capillary: 44 mg/dL — CL (ref 70–99)
Glucose-Capillary: 45 mg/dL — ABNORMAL LOW (ref 70–99)
Glucose-Capillary: 77 mg/dL (ref 70–99)
Glucose-Capillary: 86 mg/dL (ref 70–99)
Glucose-Capillary: 88 mg/dL (ref 70–99)
Glucose-Capillary: 89 mg/dL (ref 70–99)
Glucose-Capillary: 90 mg/dL (ref 70–99)

## 2021-06-06 LAB — BASIC METABOLIC PANEL
Anion gap: 12 (ref 5–15)
Anion gap: 7 (ref 5–15)
Anion gap: 8 (ref 5–15)
Anion gap: 11 (ref 5–15)
Anion gap: 10 (ref 5–15)
BUN: 52 mg/dL — ABNORMAL HIGH (ref 6–20)
BUN: 49 mg/dL — ABNORMAL HIGH (ref 6–20)
BUN: 48 mg/dL — ABNORMAL HIGH (ref 6–20)
BUN: 42 mg/dL — ABNORMAL HIGH (ref 6–20)
BUN: 44 mg/dL — ABNORMAL HIGH (ref 6–20)
CO2: 26 mmol/L (ref 22–32)
CO2: 27 mmol/L (ref 22–32)
CO2: 26 mmol/L (ref 22–32)
CO2: 24 mmol/L (ref 22–32)
CO2: 24 mmol/L (ref 22–32)
Calcium: 8.8 mg/dL — ABNORMAL LOW (ref 8.9–10.3)
Calcium: 8.5 mg/dL — ABNORMAL LOW (ref 8.9–10.3)
Calcium: 8.3 mg/dL — ABNORMAL LOW (ref 8.9–10.3)
Calcium: 8.3 mg/dL — ABNORMAL LOW (ref 8.9–10.3)
Calcium: 8.3 mg/dL — ABNORMAL LOW (ref 8.9–10.3)
Chloride: 102 mmol/L (ref 98–111)
Chloride: 105 mmol/L (ref 98–111)
Chloride: 105 mmol/L (ref 98–111)
Chloride: 103 mmol/L (ref 98–111)
Chloride: 103 mmol/L (ref 98–111)
Creatinine, Ser: 1.92 mg/dL — ABNORMAL HIGH (ref 0.61–1.24)
Creatinine, Ser: 1.7 mg/dL — ABNORMAL HIGH (ref 0.61–1.24)
Creatinine, Ser: 1.55 mg/dL — ABNORMAL HIGH (ref 0.61–1.24)
Creatinine, Ser: 1.47 mg/dL — ABNORMAL HIGH (ref 0.61–1.24)
Creatinine, Ser: 1.41 mg/dL — ABNORMAL HIGH (ref 0.61–1.24)
GFR, Estimated: 42 mL/min — ABNORMAL LOW (ref >60)
GFR, Estimated: 49 mL/min — ABNORMAL LOW (ref >60)
GFR, Estimated: 55 mL/min — ABNORMAL LOW (ref >60)
GFR, Estimated: 58 mL/min — ABNORMAL LOW (ref >60)
GFR, Estimated: >60 mL/min (ref >60)
Glucose, Bld: 93 mg/dL (ref 70–99)
Glucose, Bld: 87 mg/dL (ref 70–99)
Glucose, Bld: 149 mg/dL — ABNORMAL HIGH (ref 70–99)
Glucose, Bld: 279 mg/dL — ABNORMAL HIGH (ref 70–99)
Glucose, Bld: 358 mg/dL — ABNORMAL HIGH (ref 70–99)
Potassium: 2.9 mmol/L — ABNORMAL LOW (ref 3.5–5.1)
Potassium: 2.8 mmol/L — ABNORMAL LOW (ref 3.5–5.1)
Potassium: 3 mmol/L — ABNORMAL LOW (ref 3.5–5.1)
Potassium: 3 mmol/L — ABNORMAL LOW (ref 3.5–5.1)
Potassium: 3.1 mmol/L — ABNORMAL LOW (ref 3.5–5.1)
Sodium: 140 mmol/L (ref 135–145)
Sodium: 139 mmol/L (ref 135–145)
Sodium: 139 mmol/L (ref 135–145)
Sodium: 138 mmol/L (ref 135–145)
Sodium: 137 mmol/L (ref 135–145)

## 2021-06-06 LAB — CBC
HCT: 32.4 % — ABNORMAL LOW (ref 39.0–52.0)
Hemoglobin: 10.5 g/dL — ABNORMAL LOW (ref 13.0–17.0)
MCH: 28.6 pg (ref 26.0–34.0)
MCHC: 32.4 g/dL (ref 30.0–36.0)
MCV: 88.2 fL (ref 80.0–100.0)
Platelets: 326 10*3/uL (ref 150–400)
RBC: 3.67 MIL/uL — ABNORMAL LOW (ref 4.22–5.81)
RDW: 15.9 % — ABNORMAL HIGH (ref 11.5–15.5)
WBC: 16 10*3/uL — ABNORMAL HIGH (ref 4.0–10.5)
nRBC: 0.4 % — ABNORMAL HIGH (ref 0.0–0.2)

## 2021-06-06 LAB — BETA-HYDROXYBUTYRIC ACID: Beta-Hydroxybutyric Acid: <0.05 mmol/L — ABNORMAL LOW (ref 0.05–0.27)

## 2021-06-06 LAB — AMMONIA: Ammonia: 31 umol/L (ref 9–35)

## 2021-06-06 LAB — HEMOGLOBIN A1C
Hgb A1c MFr Bld: 9.4 % — ABNORMAL HIGH (ref 4.8–5.6)
Mean Plasma Glucose: 223.08 mg/dL

## 2021-06-06 LAB — PROCALCITONIN: Procalcitonin: 0.78 ng/mL

## 2021-06-06 LAB — TSH: TSH: 0.544 u[IU]/mL (ref 0.350–4.500)

## 2021-06-06 MED ORDER — POTASSIUM CHLORIDE CRYS ER 20 MEQ PO TBCR
20.0000 meq | EXTENDED_RELEASE_TABLET | ORAL | Status: AC
  Administered 2021-06-06 – 2021-06-07 (×2): 20 meq via ORAL
  Filled 2021-06-06 (×2): qty 1

## 2021-06-06 MED ORDER — INSULIN GLARGINE-YFGN 100 UNIT/ML ~~LOC~~ SOLN
5.0000 [IU] | SUBCUTANEOUS | Status: DC
  Administered 2021-06-06: 5 [IU] via SUBCUTANEOUS
  Filled 2021-06-06 (×2): qty 0.05

## 2021-06-06 MED ORDER — POTASSIUM CHLORIDE 10 MEQ/100ML IV SOLN
10.0000 meq | INTRAVENOUS | Status: AC
  Administered 2021-06-06 – 2021-06-07 (×4): 10 meq via INTRAVENOUS
  Filled 2021-06-06 (×5): qty 100

## 2021-06-06 MED ORDER — POTASSIUM CHLORIDE 10 MEQ/100ML IV SOLN
10.0000 meq | INTRAVENOUS | Status: AC
  Administered 2021-06-06 (×4): 10 meq via INTRAVENOUS
  Filled 2021-06-06 (×3): qty 100

## 2021-06-06 MED ORDER — POTASSIUM CHLORIDE CRYS ER 20 MEQ PO TBCR
20.0000 meq | EXTENDED_RELEASE_TABLET | Freq: Once | ORAL | Status: AC
  Administered 2021-06-06: 20 meq via ORAL
  Filled 2021-06-06: qty 1

## 2021-06-06 MED ORDER — GABAPENTIN 300 MG PO CAPS
300.0000 mg | ORAL_CAPSULE | Freq: Two times a day (BID) | ORAL | Status: DC
  Administered 2021-06-06 – 2021-06-10 (×8): 300 mg via ORAL
  Filled 2021-06-06 (×8): qty 1

## 2021-06-06 MED ORDER — INSULIN ASPART 100 UNIT/ML IJ SOLN: 2.0000 [IU] | Freq: Three times a day (TID) | INTRAMUSCULAR | Status: DC

## 2021-06-06 MED ORDER — LACTATED RINGERS IV SOLN: INTRAVENOUS | Status: DC

## 2021-06-06 MED ORDER — INSULIN ASPART 100 UNIT/ML IJ SOLN
0.0000 [IU] | Freq: Every day | INTRAMUSCULAR | Status: DC
  Administered 2021-06-06: 4 [IU] via SUBCUTANEOUS

## 2021-06-06 MED ORDER — INSULIN ASPART 100 UNIT/ML IJ SOLN
0.0000 [IU] | Freq: Three times a day (TID) | INTRAMUSCULAR | Status: DC
  Administered 2021-06-06: 1 [IU] via SUBCUTANEOUS
  Administered 2021-06-06: 5 [IU] via SUBCUTANEOUS

## 2021-06-06 MED ORDER — SODIUM CHLORIDE 0.9 % IV SOLN: INTRAVENOUS | Status: DC | PRN

## 2021-06-06 MED ORDER — METOPROLOL TARTRATE 50 MG PO TABS
50.0000 mg | ORAL_TABLET | Freq: Two times a day (BID) | ORAL | Status: DC
  Administered 2021-06-06 – 2021-06-10 (×9): 50 mg via ORAL
  Filled 2021-06-06 (×2): qty 2
  Filled 2021-06-06 (×3): qty 1
  Filled 2021-06-06 (×2): qty 2
  Filled 2021-06-06: qty 1
  Filled 2021-06-06: qty 2

## 2021-06-06 NOTE — Progress Notes (Signed)
Inpatient Diabetes Program Recommendations  AACE/ADA: New Consensus Statement on Inpatient Glycemic Control (2015)  Target Ranges:  Prepandial:   less than 140 mg/dL      Peak postprandial:   less than 180 mg/dL (1-2 hours)      Critically ill patients:  140 - 180 mg/dL   Lab Results  Component Value Date   GLUCAP 144 (H) 06/06/2021   HGBA1C 9.4 (H) 06/06/2021    Review of Glycemic Control Results for Taylor Hughes, Taylor Hughes (MRN [id number]) as of 06/06/2021 13:14  Ref. Range 06/06/2021 07:03 06/06/2021 07:34 06/06/2021 08:27 06/06/2021 11:30  Glucose-Capillary Latest Ref Range: 70 - 99 mg/dL 194 (H) 89 90 144 (H)   Diabetes history: DM 2 Outpatient Diabetes medications: Levemir 22 units qhs, Novolog 0-12 units tid Current orders for Inpatient glycemic control:  Semglee 5 units Novolog 0-9 units tid + hs Novolog 2 units tid meal coverage  A1c 9.4% on 10/27  Spoke with pt regarding A1c level of 9.4 and taking insulin while he is sick. Explained sick day guidelines to pt to take insulin and check glucose more frequently. Pt reports not taking insulin 5 days out of 8, prior to admission. Pt reports needing a new meter at time of d/c. Pt gets his medications through the [facility] pharmacy and that is where he sees his doctor. Pt has not seen a provider in awhile and will need close follow up after hospitalization.   D/c: Glucose meter kit order #[id number]  Thanks,  Tama Headings RN, MSN, BC-ADM Inpatient Diabetes Coordinator Team Pager [phone] (8a-5p)

## 2021-06-06 NOTE — Progress Notes (Signed)
Per nightshift RN, insulin gtt was stopped around 0530 d/t hypoglycemia in the 40's. 1 amp D50 was given by nightshift RN. D5LR still infusing at 125. CBG at 0734 was 89. Noe Gens, NP notified that endo tool was discontinued prior to current shift, awaiting further orders at this time. This RN will continue to carefully monitor CBG while awaiting orders.

## 2021-06-06 NOTE — Progress Notes (Addendum)
NAME:  Coty Larsh, MRN:  [id number], DOB:  07-06-1971, LOS: 1 ADMISSION DATE:  06/04/2021, CHIEF COMPLAINT: Encephalopathy, hyperglycemia  History of Present Illness:  50 year old man with a history of insulin-dependent diabetes, atrial fibrillation not on anticoagulation, GERD, obesity.  Per his sister has been experiencing some URI symptoms, patient states for over a week.  Family has noticed tachypnea for about 3 days.  He has not been taking his usual home medications including insulin for the last 3 days.  He presented with confusion, tachypnea.  Labs reveal profound metabolic acidosis and hyperglycemia, acute renal insufficiency with hyperkalemia (K+ 8.2), consistent with severe DKA.  ECG with peaked T waves.  Admitted & started on IV fluids, bicarbonate infusion, insulin infusion, received calcium gluconate.  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 rate (!) 24, height 5\' 11"  (1.803 m), weight 93.7 kg, SpO2 98 %.        Intake/Output Summary (Last 24 hours) at 06/06/2021 0753 Last data filed at 06/06/2021 5852 Gross per 24 hour  Intake 7259.05 ml  Output 1251 ml  Net 6008.05 ml   Filed Weights   06/04/21 2342 06/05/21 0500  Weight: 136.1 kg 93.7 kg    Examination: General:  chronically ill appearing adult male lying in bed in NAD HEENT: MM pink/moist, anicteric Neuro:  Awakens to voice, appropriate / drifts back to sleep, MAE CV: s1s2 RRR, no m/r/g PULM: non-labored at rest, lungs bilaterally clear  GI: soft, bsx4 active  Extremities: warm/dry, BLE 2-3+ pitting edema, LLE toe amputation, LE's with changes consistent with chronic venous insufficiency  Skin: no rashes or lesions   Resolved Hospital Problem list     Assessment & Plan:   Severe DKA Likely precipitated by URI and medical noncompliance.  A1c 9.4 -AG closed, CO2 26 & episodes of hypoglycemia > transition off insulin gtt  -continue D5LR until taking adequate PO  -judicious SSI, sensitive scale -meal coverage  -ensure HS snack  -follow BMP, beta hydroxy   Suspected Viral URI  -PCT elevated but in the presence of AKI  -follow cultures to maturity  -continue empiric ceftriaxone for now, D2/x   Severe Hyperkalemia  Hypokalemia Due to the above, initially associated with peaked T waves.  T waves improved am of 10/26. -follow BMP closely  -replace K+, repeat K+ this evening   Acute Metabolic Encephalopathy  In setting of severe metabolic derangements, DKA -follow neuro exam / non-focal -assess ammonia, TSH   Acute renal failure, due to ATN and hypoperfusion AG Metabolic Acidosis  -Trend BMP / urinary output -Replace electrolytes as indicated -Avoid nephrotoxic agents, ensure adequate renal perfusion  Hypertension -increase lopressor to home dose 50 mg BID  -PRN hydralazine, labetalol for SBP >170 -hold home lisinopril, HCTZ   Atrial fibrillation Not on baseline anticoagulation -tele monitoring  -lopressor as above,  rate control   Nausea / Vomiting  -PRN zofran  -advance diet as tolerated   Best Practice (right click and "Reselect all SmartList Selections" daily)  Diet/type: Regular consistency (see orders) DVT prophylaxis: prophylactic heparin  GI prophylaxis: PPI Lines: N/A Foley:  N/A Code Status:  full code Last date of multidisciplinary goals of care  discussion n/a Family: Discussed patient's status, issues and medical plan with his sister at bedside 06/05/2021  Pending clinical status, may be able to transition to medical service for 10/28.   Critical care time: 30 minutes   Noe Gens, MSN, APRN, NP-C, AGACNP-BC [facility] 06/06/2021, 7:53 AM   Please see [email or web address] for pager details.   From 7A-7P if no response, please call [phone] After hours, please call ELink [phone]

## 2021-06-06 NOTE — Progress Notes (Signed)
Since endo tool and insulin gtt were already discontinued on prior shift, 5 units semglee given by this RN per CCM orders. D5LR continues to infuse per orders. This RN will continue to carefully monitor patient's CBG.

## 2021-06-06 NOTE — Progress Notes (Signed)
eLink Physician-Brief Progress Note Patient Name: Day Deery DOB: 1970/10/30 MRN: [id number]   Date of Service  06/06/2021  HPI/Events of Note  K+ 3.0,  Patient wants home Neurontin resumed, Patient needs CBG check.  eICU Interventions  KCL ordered per protocol, Neurontin resumed, CBG check ordered.        Kerry Kass Anias Bartol 06/06/2021, 8:59 PM

## 2021-06-06 NOTE — Progress Notes (Signed)
eLink Physician-Brief Progress Note Patient Name: Taylor Hughes DOB: 1970-09-23 MRN: [id number]   Date of Service  06/06/2021  HPI/Events of Note  CBG 344 mg / dl on D 5 % LR at 50 ml/ hour, it has been discontinued and LR at 75 ml / hour substituted, CBG will be checked in 2 hours and if still unacceptably high patient will receive supplementary Insulin coverage.  eICU Interventions  See above.        Kerry Kass Yerlin Gasparyan 06/06/2021, 9:54 PM

## 2021-06-07 DIAGNOSIS — I480 Paroxysmal atrial fibrillation: Secondary | ICD-10-CM | POA: Insufficient documentation

## 2021-06-07 DIAGNOSIS — E876 Hypokalemia: Secondary | ICD-10-CM

## 2021-06-07 DIAGNOSIS — J069 Acute upper respiratory infection, unspecified: Secondary | ICD-10-CM

## 2021-06-07 DIAGNOSIS — G9341 Metabolic encephalopathy: Secondary | ICD-10-CM

## 2021-06-07 DIAGNOSIS — D649 Anemia, unspecified: Secondary | ICD-10-CM

## 2021-06-07 LAB — CBC
HCT: 31.4 % — ABNORMAL LOW (ref 39.0–52.0)
Hemoglobin: 10.2 g/dL — ABNORMAL LOW (ref 13.0–17.0)
MCH: 28.5 pg (ref 26.0–34.0)
MCHC: 32.5 g/dL (ref 30.0–36.0)
MCV: 87.7 fL (ref 80.0–100.0)
Platelets: 289 10*3/uL (ref 150–400)
RBC: 3.58 MIL/uL — ABNORMAL LOW (ref 4.22–5.81)
RDW: 16 % — ABNORMAL HIGH (ref 11.5–15.5)
WBC: 6.7 10*3/uL (ref 4.0–10.5)
nRBC: 0 % (ref 0.0–0.2)

## 2021-06-07 LAB — BASIC METABOLIC PANEL
Anion gap: 7 (ref 5–15)
Anion gap: 9 (ref 5–15)
Anion gap: 9 (ref 5–15)
BUN: 36 mg/dL — ABNORMAL HIGH (ref 6–20)
BUN: 36 mg/dL — ABNORMAL HIGH (ref 6–20)
BUN: 33 mg/dL — ABNORMAL HIGH (ref 6–20)
CO2: 22 mmol/L (ref 22–32)
CO2: 26 mmol/L (ref 22–32)
CO2: 24 mmol/L (ref 22–32)
Calcium: 8.2 mg/dL — ABNORMAL LOW (ref 8.9–10.3)
Calcium: 8.2 mg/dL — ABNORMAL LOW (ref 8.9–10.3)
Calcium: 8.2 mg/dL — ABNORMAL LOW (ref 8.9–10.3)
Chloride: 103 mmol/L (ref 98–111)
Chloride: 104 mmol/L (ref 98–111)
Chloride: 105 mmol/L (ref 98–111)
Creatinine, Ser: 1.23 mg/dL (ref 0.61–1.24)
Creatinine, Ser: 1.36 mg/dL — ABNORMAL HIGH (ref 0.61–1.24)
Creatinine, Ser: 1.28 mg/dL — ABNORMAL HIGH (ref 0.61–1.24)
GFR, Estimated: >60 mL/min (ref >60)
GFR, Estimated: >60 mL/min (ref >60)
GFR, Estimated: >60 mL/min (ref >60)
Glucose, Bld: 337 mg/dL — ABNORMAL HIGH (ref 70–99)
Glucose, Bld: 340 mg/dL — ABNORMAL HIGH (ref 70–99)
Glucose, Bld: 246 mg/dL — ABNORMAL HIGH (ref 70–99)
Potassium: 3.8 mmol/L (ref 3.5–5.1)
Potassium: 3.8 mmol/L (ref 3.5–5.1)
Potassium: 3.3 mmol/L — ABNORMAL LOW (ref 3.5–5.1)
Sodium: 135 mmol/L (ref 135–145)
Sodium: 136 mmol/L (ref 135–145)
Sodium: 138 mmol/L (ref 135–145)

## 2021-06-07 LAB — GLUCOSE, CAPILLARY
Glucose-Capillary: 154 mg/dL — ABNORMAL HIGH (ref 70–99)
Glucose-Capillary: 225 mg/dL — ABNORMAL HIGH (ref 70–99)
Glucose-Capillary: 242 mg/dL — ABNORMAL HIGH (ref 70–99)
Glucose-Capillary: 250 mg/dL — ABNORMAL HIGH (ref 70–99)
Glucose-Capillary: 289 mg/dL — ABNORMAL HIGH (ref 70–99)
Glucose-Capillary: 306 mg/dL — ABNORMAL HIGH (ref 70–99)
Glucose-Capillary: 308 mg/dL — ABNORMAL HIGH (ref 70–99)
Glucose-Capillary: 314 mg/dL — ABNORMAL HIGH (ref 70–99)
Glucose-Capillary: 323 mg/dL — ABNORMAL HIGH (ref 70–99)
Glucose-Capillary: 325 mg/dL — ABNORMAL HIGH (ref 70–99)
Glucose-Capillary: 342 mg/dL — ABNORMAL HIGH (ref 70–99)
Glucose-Capillary: 353 mg/dL — ABNORMAL HIGH (ref 70–99)

## 2021-06-07 LAB — MAGNESIUM: Magnesium: 1.8 mg/dL (ref 1.7–2.4)

## 2021-06-07 LAB — PROCALCITONIN: Procalcitonin: 0.22 ng/mL

## 2021-06-07 MED ORDER — POTASSIUM CHLORIDE 10 MEQ/100ML IV SOLN
10.0000 meq | INTRAVENOUS | Status: AC
  Administered 2021-06-07 (×2): 10 meq via INTRAVENOUS
  Filled 2021-06-07 (×2): qty 100

## 2021-06-07 MED ORDER — INSULIN ASPART 100 UNIT/ML IV SOLN
3.0000 [IU] | Freq: Once | INTRAVENOUS | Status: AC
  Administered 2021-06-07: 3 [IU] via INTRAVENOUS

## 2021-06-07 MED ORDER — INSULIN GLARGINE-YFGN 100 UNIT/ML ~~LOC~~ SOLN
20.0000 [IU] | Freq: Every day | SUBCUTANEOUS | Status: DC
  Administered 2021-06-07: 20 [IU] via SUBCUTANEOUS
  Filled 2021-06-07 (×2): qty 0.2

## 2021-06-07 MED ORDER — GUAIFENESIN-CODEINE 100-10 MG/5ML PO SOLN
10.0000 mL | Freq: Four times a day (QID) | ORAL | Status: DC | PRN
Start: 2021-06-07 — End: 2021-06-10
  Administered 2021-06-07 – 2021-06-09 (×5): 10 mL via ORAL
  Filled 2021-06-07 (×5): qty 10

## 2021-06-07 MED ORDER — AMOXICILLIN-POT CLAVULANATE 875-125 MG PO TABS
1.0000 | ORAL_TABLET | Freq: Two times a day (BID) | ORAL | Status: AC
  Administered 2021-06-07 – 2021-06-09 (×5): 1 via ORAL
  Filled 2021-06-07 (×5): qty 1

## 2021-06-07 MED ORDER — INSULIN ASPART 100 UNIT/ML IJ SOLN
0.0000 [IU] | Freq: Every day | INTRAMUSCULAR | Status: DC
  Administered 2021-06-09: 3 [IU] via SUBCUTANEOUS

## 2021-06-07 MED ORDER — MENTHOL 3 MG MT LOZG
1.0000 | LOZENGE | OROMUCOSAL | Status: DC | PRN
  Administered 2021-06-07: 3 mg via ORAL
  Filled 2021-06-07 (×2): qty 9

## 2021-06-07 MED ORDER — POTASSIUM CHLORIDE CRYS ER 20 MEQ PO TBCR
40.0000 meq | EXTENDED_RELEASE_TABLET | Freq: Once | ORAL | Status: AC
  Administered 2021-06-07: 40 meq via ORAL
  Filled 2021-06-07: qty 2

## 2021-06-07 MED ORDER — INSULIN ASPART 100 UNIT/ML IJ SOLN
7.0000 [IU] | Freq: Once | INTRAMUSCULAR | Status: AC
  Administered 2021-06-07: 7 [IU] via SUBCUTANEOUS

## 2021-06-07 MED ORDER — GUAIFENESIN-DM 100-10 MG/5ML PO SYRP
5.0000 mL | ORAL_SOLUTION | ORAL | Status: DC | PRN
  Administered 2021-06-07: 5 mL via ORAL
  Filled 2021-06-07: qty 10

## 2021-06-07 MED ORDER — INSULIN REGULAR(HUMAN) IN NACL 100-0.9 UT/100ML-% IV SOLN
INTRAVENOUS | Status: DC
  Administered 2021-06-07: 13 [IU]/h via INTRAVENOUS
  Filled 2021-06-07: qty 100

## 2021-06-07 MED ORDER — DEXTROSE 50 % IV SOLN: 0.0000 mL | INTRAVENOUS | Status: DC | PRN

## 2021-06-07 MED ORDER — INSULIN ASPART 100 UNIT/ML IJ SOLN
0.0000 [IU] | Freq: Three times a day (TID) | INTRAMUSCULAR | Status: DC
  Administered 2021-06-07: 15 [IU] via SUBCUTANEOUS
  Administered 2021-06-07 – 2021-06-08 (×2): 7 [IU] via SUBCUTANEOUS
  Administered 2021-06-08: 11 [IU] via SUBCUTANEOUS
  Administered 2021-06-08: 7 [IU] via SUBCUTANEOUS
  Administered 2021-06-09: 11 [IU] via SUBCUTANEOUS
  Administered 2021-06-09: 15 [IU] via SUBCUTANEOUS
  Administered 2021-06-09: 7 [IU] via SUBCUTANEOUS
  Administered 2021-06-10: 4 [IU] via SUBCUTANEOUS

## 2021-06-07 MED ORDER — INSULIN ASPART 100 UNIT/ML IJ SOLN: 5.0000 [IU] | Freq: Once | INTRAMUSCULAR | Status: DC

## 2021-06-07 MED ORDER — INSULIN ASPART 100 UNIT/ML IJ SOLN
5.0000 [IU] | Freq: Once | INTRAMUSCULAR | Status: AC
  Administered 2021-06-07: 5 [IU] via SUBCUTANEOUS

## 2021-06-07 NOTE — Progress Notes (Signed)
Chaplain made two attempts 2 hours apart to visit patient. The first time he was in therapy and the second time he was asleep.  Chaplain will provide a referral to staff for follow-up. Please contact her if patient wakes today before 430 pm. Rev. Tamsen Snider Pager [phone]

## 2021-06-07 NOTE — Assessment & Plan Note (Signed)
-   Chronic - Baseline around 11 g/dL

## 2021-06-07 NOTE — Progress Notes (Signed)
Chaplain did 2nd follow up and patient was finally available to talk.  He is filled with anxiety about his health and life situation.  He is divorced, no kids, and living in a hotel while working 2 jobs. 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  He says his sister is working on getting him any kind of help she can.  Chaplain offered words of support and prayer.   Rev. Tamsen Snider Pager [phone]

## 2021-06-07 NOTE — Evaluation (Signed)
Physical Therapy Evaluation Patient Details Name: Taylor Hughes MRN: [id number] DOB: [birth year] Today's Date: 06/07/2021  History of Present Illness  50 y.o. male admitted with DKA, hyperglycemia, acute renal insufficiency, and hyperkalemia. 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  Clinical Impression  Pt admitted as above and presenting with functional mobility limitations 2* generalized weakness, ambulatory balance deficits with c/o mild dizziness (BP WNL), and decreased endurance.  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     Recommendations for follow up therapy are one component of a multi-disciplinary discharge planning process, led by the attending physician.  Recommendations may be updated based on patient status, additional functional criteria and insurance authorization.  Follow Up Recommendations No PT follow up    Assistance Recommended at Discharge PRN  Functional Status Assessment Patient has had a recent decline in their functional status and demonstrates the ability to make significant improvements in function in a reasonable and predictable amount of time.  Equipment Recommendations  None recommended by PT    Recommendations for Other Services       Precautions / Restrictions Precautions Precautions: Fall Precaution Comments: pt with dizziness upon standing, did not appear to be orthostatic related (BP WNL) Restrictions Weight Bearing Restrictions: No      Mobility  Bed Mobility Overal bed mobility: Modified Independent             General bed mobility comments: increased time, HOB elevated    Transfers Overall transfer level: Needs assistance Equipment used: Rolling walker (2 wheels) Transfers: Sit to/from Stand Sit to Stand: Min assist           General transfer comment: Required increased  time and physical assist for power up initially, progressing to only min A for assist with RW with toilet sit to stand    Ambulation/Gait Ambulation/Gait assistance: Min assist;Min guard Gait Distance (Feet): 32 Feet Assistive device: Rolling walker (2 wheels) Gait Pattern/deviations: Step-through pattern;Decreased step length - right;Decreased step length - left;Shuffle;Trunk flexed Gait velocity: decr   General Gait Details: Slow pace, steady assist, c/o mild dizziness and fatigue  Stairs            Wheelchair Mobility    Modified Rankin (Stroke Patients Only)       Balance Overall balance assessment: Needs assistance Sitting-balance support: Feet supported;No upper extremity supported Sitting balance-Leahy Scale: Fair     Standing balance support: Bilateral upper extremity supported;Reliant on assistive device for balance Standing balance-Leahy Scale: Poor Standing balance comment: required external support BUEs                             Pertinent Vitals/Pain Pain Assessment: No/denies pain    Home Living Family/patient expects to be discharged to:: Private residence Living Arrangements: Alone Available Help at Discharge: Family Type of Home: Apartment Home Access: Stairs to enter Entrance Stairs-Rails: Can reach both Entrance Stairs-Number of Steps: flight   Home Layout: One level Home Equipment: Rollator (4 wheels);Cane - quad      Prior Function Prior Level of Function : Independent/Modified Independent;Driving             Mobility Comments: did not use Assitive device ADLs Comments: independent, 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   Dominant Hand: Left  Extremity/Trunk Assessment   Upper Extremity Assessment Upper Extremity Assessment: Defer to OT evaluation RUE Deficits / Details: MMT grossly 4-/5 LUE Deficits / Details: MMT grossly 4-/5    Lower Extremity Assessment Lower Extremity  Assessment: Generalized weakness (with significant edema noted bil LEs)    Cervical / Trunk Assessment Cervical / Trunk Assessment: Normal  Communication   Communication: No difficulties  Cognition Arousal/Alertness: Awake/alert Behavior During Therapy: WFL for tasks assessed/performed Overall Cognitive Status: Within Functional Limits for tasks assessed                                 General Comments: reports frustration/suprise at level of fatigue        General Comments      Exercises General Exercises - Lower Extremity Ankle Circles/Pumps: AROM;Both;5 reps;Seated Long Arc Quad: AROM;Both;5 reps;Seated   Assessment/Plan    PT Assessment Patient needs continued PT services  PT Problem List Decreased strength;Decreased activity tolerance;Decreased balance;Decreased mobility;Decreased knowledge of use of DME;Obesity       PT Treatment Interventions DME instruction;Gait training;Stair training;Functional mobility training;Therapeutic activities;Therapeutic exercise;Balance training;Patient/family education    PT Goals (Current goals can be found in the Care Plan section)  Acute Rehab PT Goals Patient Stated Goal: REgain IND and get back to work as cook PT Goal Formulation: With patient Time For Goal Achievement: 06/20/21 Potential to Achieve Goals: Good    Frequency Min 3X/week   Barriers to discharge Decreased caregiver support Lives alone    Co-evaluation PT/OT/SLP Co-Evaluation/Treatment: Yes Reason for Co-Treatment: For patient/therapist safety PT goals addressed during session: Mobility/safety with mobility OT goals addressed during session: ADL's and self-care       AM-PAC PT "6 Clicks" Mobility  Outcome Measure Help needed turning from your back to your side while in a flat bed without using bedrails?: None Help needed moving from lying on your back to sitting on the side of a flat bed without using bedrails?: None Help needed moving to  and from a bed to a chair (including a wheelchair)?: A Little Help needed standing up from a chair using your arms (e.g., wheelchair or bedside chair)?: A Little Help needed to walk in hospital room?: A Little Help needed climbing 3-5 steps with a railing? : A Little 6 Click Score: 20    End of Session Equipment Utilized During Treatment: Gait belt Activity Tolerance: Patient limited by fatigue Patient left: in chair;with call bell/phone within reach Nurse Communication: Mobility status PT Visit Diagnosis: Muscle weakness (generalized) (M62.81);Difficulty in walking, not elsewhere classified (R26.2)    Time: 0814-4818 PT Time Calculation (min) (ACUTE ONLY): 14 min   Charges:   PT Evaluation $PT Eval Low Complexity: 1 Low          [place] Pager [phone] Office [phone]   Taylor Hughes 06/07/2021, 3:41 PM

## 2021-06-07 NOTE — Assessment & Plan Note (Addendum)
-   suspicion on admission was due to URI symptoms and not taking any insulin for a few days - has responded well to insulin gtt and IVF - transitioned off insulin drip on 10/28

## 2021-06-07 NOTE — Assessment & Plan Note (Addendum)
-   Hemoglobin A1c 9.4% on admission.  Improved from 2020 - Patient states he takes 22 units Levemir at home and sliding scale insulin -See DKA - Discharged home on home insulin regimen.  Lifestyle modifications recommended including diet and any exercise as tolerated

## 2021-06-07 NOTE — Progress Notes (Signed)
[facility] Progress Note Patient Name: Taylor Hughes DOB: November 11, 1970 MRN: [id number]   Date of Service  06/07/2021  HPI/Events of Note  Blood sugar 306 mg / dl.  eICU Interventions  Novolog 5 units SQ x 1 ordered at 00:15 AM, CBG at 1:15 AM, BMP at 2:00 AM. Bedside RN to notify MD for blood sugar < 120 mg  / dl of > 280 mg / dl.        Kerry Kass Greysin Medlen 06/07/2021, 12:16 AM

## 2021-06-07 NOTE — Assessment & Plan Note (Signed)
-   Not on anticoagulation - Continue Lopressor

## 2021-06-07 NOTE — Assessment & Plan Note (Signed)
-   Resolved with treatment on admission

## 2021-06-07 NOTE — Hospital Course (Addendum)
[name] is a 50 year old male with PMH obesity, DM II, GERD, afib who presented to the hospital with upper respiratory symptoms that began approximately 1 week prior.  Due to worsening breathing, he had presented for further evaluation.  He also reported that he had stopped taking his insulin at home due to his poor appetite and decreased intake. On work-up in the ER he was found to be in severe DKA with hyperkalemia.  He was admitted to the ICU and started on DKA protocol. See below for further A&P.

## 2021-06-07 NOTE — Progress Notes (Signed)
[facility] Progress Note Patient Name: Taylor Hughes DOB: 27-Jun-1971 MRN: [id number]   Date of Service  06/07/2021  HPI/Events of Note  Blood sugar 340 mg  / dl on latest BMP.  eICU Interventions  Novolog 3 unit iv x 1 ordered, CBG hourly x 4.        Kerry Kass Myra Weng 06/07/2021, 4:37 AM

## 2021-06-07 NOTE — Assessment & Plan Note (Signed)
-   Presumed due to DKA and metabolic derangements on admission - Has resolved with treatment

## 2021-06-07 NOTE — Progress Notes (Signed)
Inpatient Diabetes Program Recommendations  AACE/ADA: New Consensus Statement on Inpatient Glycemic Control (2015)  Target Ranges:  Prepandial:   less than 140 mg/dL      Peak postprandial:   less than 180 mg/dL (1-2 hours)      Critically ill patients:  140 - 180 mg/dL  Results for Taylor Hughes, Taylor Hughes (MRN [id number]) as of 06/07/2021 06:55  Ref. Range 06/06/2021 08:27 06/06/2021 11:30 06/06/2021 16:19 06/06/2021 21:19 06/06/2021 23:59 06/07/2021 01:15 06/07/2021 05:08 06/07/2021 06:07  Glucose-Capillary Latest Ref Range: 70 - 99 mg/dL 90 144 (H)  1 unit Novolog  5 units Semglee @1007   260 (H)  5 units Novolog @1703  344 (H)  4 units Novolog @2207  306 (H)  5 units Novolog  308 (H) 323 (H)  3 units Novolog @0449  325 (H)     Home DM Meds: Levemir 22 units qhs     Novolog 0-12 units tid  Current Orders: IV Insulin per Phase 2 of the ICU Glycemic Control Protocol    MD- Note IV Insulin starting this AM due to persistent hyperglycemia.  May not have been given enough basal insulin for transition off the Insulin drip yesterday AM--Was only given 5 units Semglee and takes Levemir 22 units at home.  BMET from 0253 AM shows the following: Glucose 337 Anion Gap 9 CO2 level 22  If decision made to pursue glucose control with SQ Insulin this AM instead of IV Insulin, or when pt finally ready to transition back to SQ Insulin from the IV Insulin drip, recommend the following for transition:  1. Levemir 18 units Daily (80% total home dose)  2. Novolog Moderate Correction Scale/ SSI (0-15 units) TID AC + HS  3. Novolog 6 units TID with meals for meal coverage     --Will follow patient during hospitalization--  Wyn Quaker RN, MSN, CDE Diabetes Coordinator Inpatient Glycemic Control Team Team Pager: [phone] (8a-5p)

## 2021-06-07 NOTE — Progress Notes (Signed)
eLink Physician-Brief Progress Note Patient Name: Taylor Hughes DOB: 03/15/1971 MRN: [id number]   Date of Service  06/07/2021  HPI/Events of Note  Patient had a 27 beat run of wide-complex tachycardia while asleep, he remained hemodynamically stable, last K+ was 3.1, and he has not had a magnesium level checked  recently.  eICU Interventions  KCL 20 meq iv over 2 hours ordered, followed by a BMP at 10 AM, magnesium level check added to blood he has in the lab from his last BMP check.        Kerry Kass Javi Bollman 06/07/2021, 5:53 AM

## 2021-06-07 NOTE — Assessment & Plan Note (Signed)
Continue current regimen

## 2021-06-07 NOTE — Assessment & Plan Note (Addendum)
-   suspected viral on admission but had significantly elevated WBC and PCT also elevated -Completed antibiotic course in the hospital

## 2021-06-07 NOTE — Assessment & Plan Note (Signed)
-   Repleting as needed

## 2021-06-07 NOTE — Progress Notes (Signed)
Progress Note    Taylor Hughes   BPZ:[id number]  DOB: August 31, 1970  DOA: 06/04/2021     2 Date of Service: 06/07/2021   Clinical Course Taylor Hughes is a 50 year old male with PMH obesity, DM II, GERD, afib who presented to the hospital with upper respiratory symptoms that began approximately 1 week prior.  Due to worsening breathing, he had presented for further evaluation.  He also reported that he had stopped taking his insulin at home due to his poor appetite and decreased intake. On work-up in the ER he was found to be in severe DKA with hyperkalemia.  He was admitted to the ICU and started on DKA protocol.  Assessment and Plan * DKA (diabetic ketoacidosis) (HCC)-resolved as of 06/07/2021 - suspicion on admission was due to URI symptoms and not taking any insulin for a few days - has responded well to insulin gtt and IVF - transition off insulin drip today and start SQ insulin and adjust as needed  AKI (acute kidney injury) ([facility]) - baseline creatinine ~ 0.9 - patient presents with increase in creat >0.3 mg/dL above baseline, creat increase >1.5x baseline presumed to have occurred within past 7 days PTA -Creatinine 2.65 on admission.  Etiology presumed to be prerenal from poor oral intake prior to admission - Creatinine slowly improving - Continue diet which is now resumed  DKA, type 2 (HCC) - Hemoglobin A1c 9.4% on admission.  Improved from 2020 - Patient states he takes 22 units Levemir at home and sliding scale insulin -See DKA - Adjusting insulins as needed while transitioning off insulin drip  URI (upper respiratory infection) - suspected viral on admission but had significantly elevated WBC and PCT also elevated - continue rocephin as seems to be improving - will complete 5 day course total of abx - can transition to PO since starting diet today; d/c CTX and start Augmentin to complete course  Normocytic anemia - Chronic - Baseline around 11 g/dL  Hypokalemia -  Repleting as needed  PAF (paroxysmal atrial fibrillation) ([facility]) - Not on anticoagulation - Continue Lopressor  Essential hypertension - Continue current regimen  Acute metabolic encephalopathy-resolved as of 06/07/2021 - Presumed due to DKA and metabolic derangements on admission - Has resolved with treatment  Hyperkalemia-resolved as of 06/07/2021 - Resolved with treatment on admission     Subjective:  No events overnight.  Feeling better this morning.  Nausea has improved some and he is amenable for starting on a liquid diet.  Informed him to let nursing know if he wishes to advance further later in the day if he tolerates well. Endorsed not taking his insulin at home due to feeling poorly prior to admission which likely contributed to his DKA. Called and spoke with his sister on the phone while in the room with him today and update given with questions answered.  Objective Vitals:   06/07/21 1100 06/07/21 1136 06/07/21 1200 06/07/21 1251  BP: 126/80  (!) 148/74   Pulse: 95 96 98   Resp: 20 (!) 24 (!) 24   Temp:    98.5 F (36.9 C)  TempSrc:    Oral  SpO2: 98% 98% 93%   Weight:      Height:       (!) 141.9 kg  Vital signs were reviewed and unremarkable.   Exam Physical Exam Constitutional:      General: He is not in acute distress.    Appearance: Normal appearance. He is obese. He is not ill-appearing.  HENT:  Head: Normocephalic and atraumatic.     Mouth/Throat:     Mouth: Mucous membranes are moist.  Eyes:     Extraocular Movements: Extraocular movements intact.  Cardiovascular:     Rate and Rhythm: Normal rate and regular rhythm.     Heart sounds: Normal heart sounds.  Pulmonary:     Effort: Pulmonary effort is normal. No respiratory distress.     Breath sounds: Normal breath sounds.  Abdominal:     General: Bowel sounds are normal. There is no distension.     Palpations: Abdomen is soft.     Tenderness: There is no abdominal tenderness.   Musculoskeletal:        General: Normal range of motion.     Cervical back: Normal range of motion and neck supple.  Skin:    General: Skin is warm and dry.  Neurological:     General: No focal deficit present.     Mental Status: He is alert.  Psychiatric:        Mood and Affect: Mood normal.        Behavior: Behavior normal.     Labs / Other Information My review of labs, imaging, notes and other tests shows no new significant findings.    Disposition Plan: Status is: Inpatient  Remains inpatient appropriate because: treatment of above     Time spent: Greater than 50% of the 35 minute visit was spent in counseling/coordination of care for the patient as laid out in the A&P.  Taylor Dee, MD [facility] 06/07/2021, 1:18 PM

## 2021-06-07 NOTE — Evaluation (Signed)
Occupational Therapy Evaluation Patient Details Name: Taylor Hughes MRN: [id number] DOB: Apr 20, 1971 Today's Date: 06/07/2021   History of Present Illness 50 y.o. male admitted with DKA, hyperglycemia, acute renal insufficiency, and hyperkalemia. 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 Upon evaluation, pt with decreased functional strength, activity tolerance, and balance limiting functional abilities. Pt currently requires Max A for LB dressing, set up for UB ADLs, and Min A for toilet transfer with RW. Pt dizzy with standing, non orthostatic. Patient will benefit from skilled OT services while in hospital to improve deficits and learn compensatory strategies as needed in order to return to PLOF. Expect pt should improve while in hospital and should have no OT post d/c, recommending no OT follow up pending pt progress.      Recommendations for follow up therapy are one component of a multi-disciplinary discharge planning process, led by the attending physician.  Recommendations may be updated based on patient status, additional functional criteria and insurance authorization.   Follow Up Recommendations  No OT follow up    Assistance Recommended at Discharge Intermittent Supervision/Assistance  Functional Status Assessment  Patient has had a recent decline in their functional status and demonstrates the ability to make significant improvements in function in a reasonable and predictable amount of time.  Equipment Recommendations  Other (comment) (TBD)    Recommendations for Other Services       Precautions / Restrictions Precautions Precautions: Fall Precaution Comments: pt with dizziness upon standing, did not appear to be orthostatic related (BP  WNL) Restrictions Weight Bearing Restrictions: No      Mobility Bed Mobility Overal bed mobility: Modified Independent             General bed mobility comments: increased time, HOB elevated    Transfers Overall transfer level: Needs assistance Equipment used: Rolling walker (2 wheels) Transfers: Sit to/from Stand Sit to Stand: Min assist           General transfer comment: Required increased time and physical assist for power up initially, progressing to only min A for assist with RW with toilet sit to stand      Balance Overall balance assessment: Needs assistance Sitting-balance support: Feet supported;No upper extremity supported Sitting balance-Leahy Scale: Fair     Standing balance support: Bilateral upper extremity supported;Reliant on assistive device for balance Standing balance-Leahy Scale: Poor Standing balance comment: required external support BUEs                           ADL either performed or assessed with clinical judgement   ADL Overall ADL's : Needs assistance/impaired Eating/Feeding: Modified independent;Sitting   Grooming: Set up;Sitting   Upper Body Bathing: Set up;Sitting   Lower Body Bathing: Sit to/from stand;Maximal assistance   Upper Body Dressing : Set up;Sitting   Lower Body Dressing: Maximal assistance;Sit to/from stand Lower Body Dressing Details (indicate cue type and reason): declined attempting to put socks on, needing total assist, "feels like I am going to fall over onto the ground" Toilet Transfer: Minimal assistance;Rolling walker (2 wheels) Toilet Transfer Details (indicate cue type and reason): Required assist to stabilize RW Toileting- Clothing Manipulation and Hygiene: Sit to/from stand;Moderate assistance  Functional mobility during ADLs: Minimal assistance;Rolling walker (2 wheels)       Vision Patient Visual Report: No change from baseline       Perception     Praxis       Pertinent Vitals/Pain Pain Assessment: No/denies pain     Hand Dominance Left   Extremity/Trunk Assessment Upper Extremity Assessment Upper Extremity Assessment: RUE deficits/detail;LUE deficits/detail;Generalized weakness RUE Deficits / Details: MMT grossly 4-/5 LUE Deficits / Details: MMT grossly 4-/5   Lower Extremity Assessment Lower Extremity Assessment: Defer to PT evaluation   Cervical / Trunk Assessment Cervical / Trunk Assessment: Normal   Communication Communication Communication: No difficulties   Cognition Arousal/Alertness: Awake/alert Behavior During Therapy: WFL for tasks assessed/performed Overall Cognitive Status: Within Functional Limits for tasks assessed                                 General Comments: reports frustration/suprise at level of fatigue     General Comments       Exercises     Shoulder Instructions      Home Living Family/patient expects to be discharged to:: Private residence Living Arrangements: Alone Available Help at Discharge: Family Type of Home: Apartment Home Access: Stairs to enter Technical brewer of Steps: flight Entrance Stairs-Rails: Can reach both Home Layout: One level     Bathroom Shower/Tub: Teacher, early years/pre: Standard     Home Equipment: Rollator (4 wheels);Cane - quad          Prior Functioning/Environment Prior Level of Function : Independent/Modified Independent;Driving             Mobility Comments: did not use Assitive device ADLs Comments: independent, works as a Training and development officer at two [employer]        OT Problem List: Decreased strength;Decreased activity tolerance;Decreased range of motion;Impaired balance (sitting and/or standing);Decreased safety awareness;Decreased knowledge of use of DME or AE;Decreased knowledge of precautions;Increased edema      OT Treatment/Interventions: Self-care/ADL training;Therapeutic exercise;DME and/or AE  instruction;Therapeutic activities;Patient/family education;Balance training    OT Goals(Current goals can be found in the care plan section) Acute Rehab OT Goals Patient Stated Goal: To go home, get back to work OT Goal Formulation: With patient Time For Goal Achievement: 06/21/21 Potential to Achieve Goals: Good  OT Frequency: Min 2X/week   Barriers to D/C:            Co-evaluation PT/OT/SLP Co-Evaluation/Treatment: Yes Reason for Co-Treatment: For patient/therapist safety;To address functional/ADL transfers PT goals addressed during session: Mobility/safety with mobility;Proper use of DME OT goals addressed during session: ADL's and self-care;Proper use of Adaptive equipment and DME      AM-PAC OT "6 Clicks" Daily Activity     Outcome Measure Help from another person eating meals?: None Help from another person taking care of personal grooming?: A Little Help from another person toileting, which includes using toliet, bedpan, or urinal?: A Lot Help from another person bathing (including washing, rinsing, drying)?: A Lot Help from another person to put on and taking off regular upper body clothing?: A Little Help from another person to put on and taking off regular lower body clothing?: A Lot 6 Click Score: 16   End of Session Equipment Utilized During Treatment: Gait belt Nurse Communication: Mobility status;Other (comment) (dizziness)  Activity Tolerance: Patient tolerated treatment well;Patient limited by fatigue Patient left: with call bell/phone within reach;with chair alarm set;in chair  OT Visit  Diagnosis: Unsteadiness on feet (R26.81);Muscle weakness (generalized) (M62.81)                Time: 3552-1747 OT Time Calculation (min): 29 min Charges:  OT General Charges $OT Visit: 1 Visit OT Evaluation $OT Eval Low Complexity: Burna, OTS Acute Rehab Office: [phone]   Rubens Cranston 06/07/2021, 2:37 PM

## 2021-06-07 NOTE — Assessment & Plan Note (Signed)
-   baseline creatinine ~ 0.9 - patient presents with increase in creat >0.3 mg/dL above baseline, creat increase >1.5x baseline presumed to have occurred within past 7 days PTA -Creatinine 2.65 on admission.  Etiology presumed to be prerenal from poor oral intake prior to admission - Creatinine slowly improving - Continue diet which is now resumed

## 2021-06-07 NOTE — Progress Notes (Signed)
eLink Physician-Brief Progress Note Patient Name: Taylor Hughes DOB: [birth year] MRN: [id number]   Date of Service  06/07/2021  HPI/Events of Note  Patient with persistent hyperglycemia despite multiple interventions involving SQ and iv Insulin.  eICU Interventions  Insulin gtt protocol ordered.        Kerry Kass Emanii Bugbee 06/07/2021, 6:36 AM

## 2021-06-08 LAB — GLUCOSE, CAPILLARY
Glucose-Capillary: 260 mg/dL — ABNORMAL HIGH (ref 70–99)
Glucose-Capillary: 248 mg/dL — ABNORMAL HIGH (ref 70–99)
Glucose-Capillary: 220 mg/dL — ABNORMAL HIGH (ref 70–99)
Glucose-Capillary: 187 mg/dL — ABNORMAL HIGH (ref 70–99)

## 2021-06-08 LAB — MAGNESIUM: Magnesium: 1.9 mg/dL (ref 1.7–2.4)

## 2021-06-08 LAB — BASIC METABOLIC PANEL
Anion gap: 9 (ref 5–15)
BUN: 25 mg/dL — ABNORMAL HIGH (ref 6–20)
CO2: 27 mmol/L (ref 22–32)
Calcium: 8.3 mg/dL — ABNORMAL LOW (ref 8.9–10.3)
Chloride: 101 mmol/L (ref 98–111)
Creatinine, Ser: 1.05 mg/dL (ref 0.61–1.24)
GFR, Estimated: >60 mL/min (ref >60)
Glucose, Bld: 248 mg/dL — ABNORMAL HIGH (ref 70–99)
Potassium: 3.9 mmol/L (ref 3.5–5.1)
Sodium: 137 mmol/L (ref 135–145)

## 2021-06-08 LAB — CBC WITH DIFFERENTIAL/PLATELET
Abs Immature Granulocytes: 0.03 10*3/uL (ref 0.00–0.07)
Basophils Absolute: 0 10*3/uL (ref 0.0–0.1)
Basophils Relative: 0 %
Eosinophils Absolute: 0 10*3/uL (ref 0.0–0.5)
Eosinophils Relative: 0 %
HCT: 30 % — ABNORMAL LOW (ref 39.0–52.0)
Hemoglobin: 9.7 g/dL — ABNORMAL LOW (ref 13.0–17.0)
Immature Granulocytes: 1 %
Lymphocytes Relative: 30 %
Lymphs Abs: 1.7 10*3/uL (ref 0.7–4.0)
MCH: 28.5 pg (ref 26.0–34.0)
MCHC: 32.3 g/dL (ref 30.0–36.0)
MCV: 88.2 fL (ref 80.0–100.0)
Monocytes Absolute: 0.8 10*3/uL (ref 0.1–1.0)
Monocytes Relative: 13 %
Neutro Abs: 3.2 10*3/uL (ref 1.7–7.7)
Neutrophils Relative %: 56 %
Platelets: 268 10*3/uL (ref 150–400)
RBC: 3.4 MIL/uL — ABNORMAL LOW (ref 4.22–5.81)
RDW: 16.4 % — ABNORMAL HIGH (ref 11.5–15.5)
WBC: 5.6 10*3/uL (ref 4.0–10.5)
nRBC: 0 % (ref 0.0–0.2)

## 2021-06-08 MED ORDER — LIP MEDEX EX OINT: 1.0000 "application " | TOPICAL_OINTMENT | CUTANEOUS | Status: DC | PRN

## 2021-06-08 MED ORDER — INSULIN GLARGINE-YFGN 100 UNIT/ML ~~LOC~~ SOLN
22.0000 [IU] | Freq: Every day | SUBCUTANEOUS | Status: DC
  Administered 2021-06-08 – 2021-06-10 (×3): 22 [IU] via SUBCUTANEOUS
  Filled 2021-06-08 (×3): qty 0.22

## 2021-06-08 MED ORDER — SALINE SPRAY 0.65 % NA SOLN
1.0000 | NASAL | Status: DC | PRN
  Filled 2021-06-08: qty 44

## 2021-06-08 MED ORDER — ALUM & MAG HYDROXIDE-SIMETH 200-200-20 MG/5ML PO SUSP
30.0000 mL | ORAL | Status: DC | PRN
  Administered 2021-06-08: 30 mL via ORAL
  Filled 2021-06-08: qty 30

## 2021-06-08 MED ORDER — LORATADINE 10 MG PO TABS: 10.0000 mg | ORAL_TABLET | Freq: Every day | ORAL | Status: DC | PRN

## 2021-06-08 NOTE — Progress Notes (Addendum)
Progress Note    Taylor Hughes   HGD:[id number]  DOB: 02-22-1971  DOA: 06/04/2021     3 Date of Service: 06/08/2021   Clinical Course Taylor Hughes is a 51 year old male with PMH obesity, DM II, GERD, afib who presented to the hospital with upper respiratory symptoms that began approximately 1 week prior.  Due to worsening breathing, he had presented for further evaluation.  He also reported that he had stopped taking his insulin at home due to his poor appetite and decreased intake. On work-up in the ER he was found to be in severe DKA with hyperkalemia.  He was admitted to the ICU and started on DKA protocol.   Assessment and Plan * DKA (diabetic ketoacidosis) (HCC)-resolved as of 06/07/2021 - suspicion on admission was due to URI symptoms and not taking any insulin for a few days - has responded well to insulin gtt and IVF - transitioned off insulin drip on 10/28  AKI (acute kidney injury) ([place]) - baseline creatinine ~ 0.9 - patient presents with increase in creat >0.3 mg/dL above baseline, creat increase >1.5x baseline presumed to have occurred within past 7 days PTA -Creatinine 2.65 on admission.  Etiology presumed to be prerenal from poor oral intake prior to admission - Creatinine slowly improving - Continue diet which is now resumed  DMII (diabetes mellitus, type 2) (HCC) - Hemoglobin A1c 9.4% on admission.  Improved from 2020 - Patient states he takes 22 units Levemir at home and sliding scale insulin -See DKA - Adjusting insulins as needed while transitioning off insulin drip  URI (upper respiratory infection) - suspected viral on admission but had significantly elevated WBC and PCT also elevated - continue rocephin as seems to be improving - will complete 5 day course total of abx - can transition to PO since starting diet today; d/c CTX and start Augmentin to complete course  Normocytic anemia - Chronic - Baseline around 11 g/dL  Hypokalemia - Repleting as  needed  PAF (paroxysmal atrial fibrillation) ([place]) - Not on anticoagulation - Continue Lopressor  Essential hypertension - Continue current regimen  Acute metabolic encephalopathy-resolved as of 06/07/2021 - Presumed due to DKA and metabolic derangements on admission - Has resolved with treatment  Hyperkalemia-resolved as of 06/07/2021 - Resolved with treatment on admission    Subjective:  No events overnight.  Patient was much more fatigued in bed this morning and appetite has been poor.  He was preferring to transition back to full liquid diet instead of solid diet.  Objective Vitals:   06/08/21 0900 06/08/21 0914 06/08/21 1000 06/08/21 1100  BP:  (!) 152/81 (!) 176/95   Pulse: (!) 102 (!) 103 95 91  Resp: 18  19 20   Temp:      TempSrc:      SpO2: 98%  94% 99%  Weight:      Height:       (!) 143.4 kg  Vital signs were reviewed and unremarkable.   Exam Physical Exam Constitutional:      Comments: Fatigued appearing but in no acute distress  HENT:     Head: Normocephalic and atraumatic.     Mouth/Throat:     Mouth: Mucous membranes are moist.  Eyes:     Extraocular Movements: Extraocular movements intact.  Cardiovascular:     Rate and Rhythm: Normal rate and regular rhythm.  Pulmonary:     Effort: Pulmonary effort is normal.     Breath sounds: Normal breath sounds.  Abdominal:  General: Bowel sounds are normal. There is no distension.     Palpations: Abdomen is soft.     Tenderness: There is no abdominal tenderness.  Musculoskeletal:        General: Normal range of motion.     Cervical back: Normal range of motion and neck supple.  Skin:    General: Skin is warm and dry.  Neurological:     General: No focal deficit present.  Psychiatric:        Mood and Affect: Mood normal.        Behavior: Behavior normal.     Labs / Other Information My review of labs, imaging, notes and other tests shows no new significant findings.    Disposition  Plan: Status is: Inpatient  Remains inpatient appropriate because: Treatment of above    Time spent: Greater than 50% of the 35 minute visit was spent in counseling/coordination of care for the patient as laid out in the A&P.  Dwyane Dee, MD [facility] 06/08/2021, 1:16 PM

## 2021-06-08 NOTE — Progress Notes (Signed)
Report given to [facility] in [place].... Pt is eating lunch at this time and will tx out after he eats.

## 2021-06-08 NOTE — Progress Notes (Signed)
Attempted to give report to RN in [place] but she was not aware she was getting a pt from [place] and said she would speak w/her charge RN and would call me right back.

## 2021-06-08 NOTE — TOC Progression Note (Signed)
Transition of Care [facility]) - Progression Note    Patient Details  Name: Taylor Hughes MRN: [id number] Date of Birth: 1971-03-11  [facility]) CM/SW Contact  Ross Ludwig, [place] Phone Number: 06/08/2021, 5:17 PM  Clinical Narrative:     CSW was informed that patient may need help with his insulin.  Patient is already set up with [facility] and [facility], and can receive his medications inexpensively.  CSW to continue to follow patient's progress throughout discharge planning.        Expected Discharge Plan and Services                                                 Social Determinants of Health (SDOH) Interventions    Readmission Risk Interventions No flowsheet data found.

## 2021-06-08 NOTE — Progress Notes (Signed)
Pt tx to 3E via wheelchair around 1500 ... pt tolerated well

## 2021-06-09 DIAGNOSIS — E119 Type 2 diabetes mellitus without complications: Secondary | ICD-10-CM

## 2021-06-09 DIAGNOSIS — Z794 Long term (current) use of insulin: Secondary | ICD-10-CM

## 2021-06-09 LAB — GLUCOSE, CAPILLARY
Glucose-Capillary: 212 mg/dL — ABNORMAL HIGH (ref 70–99)
Glucose-Capillary: 341 mg/dL — ABNORMAL HIGH (ref 70–99)
Glucose-Capillary: 262 mg/dL — ABNORMAL HIGH (ref 70–99)

## 2021-06-09 LAB — CBC WITH DIFFERENTIAL/PLATELET
Abs Immature Granulocytes: 0.02 10*3/uL (ref 0.00–0.07)
Basophils Absolute: 0 10*3/uL (ref 0.0–0.1)
Basophils Relative: 0 %
Eosinophils Absolute: 0 10*3/uL (ref 0.0–0.5)
Eosinophils Relative: 0 %
HCT: 32.1 % — ABNORMAL LOW (ref 39.0–52.0)
Hemoglobin: 10.4 g/dL — ABNORMAL LOW (ref 13.0–17.0)
Immature Granulocytes: 0 %
Lymphocytes Relative: 30 %
Lymphs Abs: 1.6 10*3/uL (ref 0.7–4.0)
MCH: 28.7 pg (ref 26.0–34.0)
MCHC: 32.4 g/dL (ref 30.0–36.0)
MCV: 88.7 fL (ref 80.0–100.0)
Monocytes Absolute: 0.7 10*3/uL (ref 0.1–1.0)
Monocytes Relative: 13 %
Neutro Abs: 3.1 10*3/uL (ref 1.7–7.7)
Neutrophils Relative %: 57 %
Platelets: 297 10*3/uL (ref 150–400)
RBC: 3.62 MIL/uL — ABNORMAL LOW (ref 4.22–5.81)
RDW: 16.4 % — ABNORMAL HIGH (ref 11.5–15.5)
WBC: 5.4 10*3/uL (ref 4.0–10.5)
nRBC: 0 % (ref 0.0–0.2)

## 2021-06-09 LAB — BASIC METABOLIC PANEL
Anion gap: 9 (ref 5–15)
BUN: 18 mg/dL (ref 6–20)
CO2: 28 mmol/L (ref 22–32)
Calcium: 8.2 mg/dL — ABNORMAL LOW (ref 8.9–10.3)
Chloride: 99 mmol/L (ref 98–111)
Creatinine, Ser: 0.93 mg/dL (ref 0.61–1.24)
GFR, Estimated: >60 mL/min (ref >60)
Glucose, Bld: 272 mg/dL — ABNORMAL HIGH (ref 70–99)
Potassium: 3.7 mmol/L (ref 3.5–5.1)
Sodium: 136 mmol/L (ref 135–145)

## 2021-06-09 LAB — MAGNESIUM: Magnesium: 1.7 mg/dL (ref 1.7–2.4)

## 2021-06-09 MED ORDER — PANTOPRAZOLE SODIUM 40 MG PO TBEC
40.0000 mg | DELAYED_RELEASE_TABLET | Freq: Every day | ORAL | Status: DC
  Administered 2021-06-09: 40 mg via ORAL
  Filled 2021-06-09: qty 1

## 2021-06-09 MED ORDER — DM-GUAIFENESIN ER 30-600 MG PO TB12
1.0000 | ORAL_TABLET | Freq: Two times a day (BID) | ORAL | Status: DC
  Administered 2021-06-09 – 2021-06-10 (×3): 1 via ORAL
  Filled 2021-06-09 (×3): qty 1

## 2021-06-09 MED ORDER — ONDANSETRON HCL 4 MG PO TABS
4.0000 mg | ORAL_TABLET | Freq: Four times a day (QID) | ORAL | Status: DC | PRN
  Administered 2021-06-09: 4 mg via ORAL
  Filled 2021-06-09: qty 1

## 2021-06-09 NOTE — Progress Notes (Signed)
PHARMACIST - PHYSICIAN COMMUNICATION  DR:   Sabino Gasser  CONCERNING: IV to Oral Route Change Policy  RECOMMENDATION: This patient is receiving pantoprazole by the intravenous route.  Based on criteria approved by the Pharmacy and Therapeutics Committee, the intravenous medication(s) is/are being converted to the equivalent oral dose form(s).   DESCRIPTION: These criteria include: The patient is eating (either orally or via tube) and/or has been taking other orally administered medications for a least 24 hours The patient has no evidence of active gastrointestinal bleeding or impaired GI absorption (gastrectomy, short bowel, patient on TNA or NPO).  If you have questions about this conversion, please contact the Pharmacy Department  []   [phone] )  [name] []   [phone] )  [name] []   [phone] )  [name] []   [phone] )  [facility] [x]   [phone] )  [place], [facility] 06/09/2021 10:21 AM

## 2021-06-09 NOTE — Progress Notes (Signed)
Progress Note    Taylor Hughes   [id number]:[id number]  DOB: 1970-11-21  DOA: 06/04/2021     4 Date of Service: 06/09/2021   Clinical Course [name] is a 50 year old male with PMH obesity, DM II, GERD, afib who presented to the hospital with upper respiratory symptoms that began approximately 1 week prior.  Due to worsening breathing, he had presented for further evaluation.  He also reported that he had stopped taking his insulin at home due to his poor appetite and decreased intake. On work-up in the ER he was found to be in severe DKA with hyperkalemia.  He was admitted to the ICU and started on DKA protocol.   Assessment and Plan * DKA (diabetic ketoacidosis) (HCC)-resolved as of 06/07/2021 - suspicion on admission was due to URI symptoms and not taking any insulin for a few days - has responded well to insulin gtt and IVF - transitioned off insulin drip on 10/28  DMII (diabetes mellitus, type 2) (HCC) - Hemoglobin A1c 9.4% on admission.  Improved from 2020 - Patient states he takes 22 units Levemir at home and sliding scale insulin -See DKA - Adjusting insulins as needed while transitioning off insulin drip  AKI (acute kidney injury) (HCC)-resolved as of 06/09/2021 - baseline creatinine ~ 0.9 - patient presents with increase in creat >0.3 mg/dL above baseline, creat increase >1.5x baseline presumed to have occurred within past 7 days PTA -Creatinine 2.65 on admission.  Etiology presumed to be prerenal from poor oral intake prior to admission - Creatinine slowly improving - Continue diet which is now resumed  URI (upper respiratory infection) - suspected viral on admission but had significantly elevated WBC and PCT also elevated - continue rocephin as seems to be improving - will complete 5 day course total of abx - can transition to PO since starting diet today; d/c CTX and start Augmentin to complete course  Normocytic anemia - Chronic - Baseline around 11  g/dL  Hypokalemia - Repleting as needed  PAF (paroxysmal atrial fibrillation) ([place]) - Not on anticoagulation - Continue Lopressor  Essential hypertension - Continue current regimen  Acute metabolic encephalopathy-resolved as of 06/07/2021 - Presumed due to DKA and metabolic derangements on admission - Has resolved with treatment  Hyperkalemia-resolved as of 06/07/2021 - Resolved with treatment on admission     Subjective:  No events overnight.  Still has poor appetite this morning and nervous about doing solid diet but at the same time wants to improve so that he could go home.  Objective Vitals:   06/09/21 0151 06/09/21 0600 06/09/21 0933 06/09/21 1319  BP: (!) 144/78 (!) 147/83 (!) 137/93 127/85  Pulse: 92 94 96 88  Resp: 16 16 18 18   Temp: 99.1 F (37.3 C) 98.3 F (36.8 C) 98.5 F (36.9 C) 98.9 F (37.2 C)  TempSrc: Oral Oral Oral Oral  SpO2: 100% 100% 100% 100%  Weight:      Height:       (!) 143.4 kg  Vital signs were reviewed and unremarkable.   Exam Physical Exam Constitutional:      Comments: Fatigued appearing but in no acute distress  HENT:     Head: Normocephalic and atraumatic.     Mouth/Throat:     Mouth: Mucous membranes are moist.  Eyes:     Extraocular Movements: Extraocular movements intact.  Cardiovascular:     Rate and Rhythm: Normal rate and regular rhythm.  Pulmonary:     Effort: Pulmonary effort is normal.  Breath sounds: Normal breath sounds.  Abdominal:     General: Bowel sounds are normal. There is no distension.     Palpations: Abdomen is soft.     Tenderness: There is no abdominal tenderness.  Musculoskeletal:        General: Normal range of motion.     Cervical back: Normal range of motion and neck supple.  Skin:    General: Skin is warm and dry.  Neurological:     General: No focal deficit present.  Psychiatric:        Mood and Affect: Mood normal.        Behavior: Behavior normal.     Labs / Other  Information My review of labs, imaging, notes and other tests shows no new significant findings.    Disposition Plan: Status is: Inpatient  Remains inpatient appropriate because: Ongoing treatment of above     Time spent: Greater than 50% of the 35 minute visit was spent in counseling/coordination of care for the patient as laid out in the A&P.  Dwyane Dee, MD [facility] 06/09/2021, 1:24 PM

## 2021-06-09 NOTE — Progress Notes (Signed)
Pt laying in bed most of morning. Pt c/o's of sore throat, cough, general malaise and that the food is terrible. Reviewed carb modified/diabetic diet w pt and questions answered. Had to strongly encourage him to be OOB and sit in the recliner. Pt remains inct of stool.

## 2021-06-10 ENCOUNTER — Other Ambulatory Visit: Payer: Self-pay

## 2021-06-10 ENCOUNTER — Encounter: Payer: Self-pay | Admitting: Internal Medicine

## 2021-06-10 LAB — MAGNESIUM: Magnesium: 1.7 mg/dL (ref 1.7–2.4)

## 2021-06-10 LAB — BASIC METABOLIC PANEL
Anion gap: 9 (ref 5–15)
BUN: 13 mg/dL (ref 6–20)
CO2: 25 mmol/L (ref 22–32)
Calcium: 8.1 mg/dL — ABNORMAL LOW (ref 8.9–10.3)
Chloride: 101 mmol/L (ref 98–111)
Creatinine, Ser: 0.82 mg/dL (ref 0.61–1.24)
GFR, Estimated: >60 mL/min (ref >60)
Glucose, Bld: 180 mg/dL — ABNORMAL HIGH (ref 70–99)
Potassium: 4.2 mmol/L (ref 3.5–5.1)
Sodium: 135 mmol/L (ref 135–145)

## 2021-06-10 LAB — CBC WITH DIFFERENTIAL/PLATELET
Abs Immature Granulocytes: 0.03 10*3/uL (ref 0.00–0.07)
Basophils Absolute: 0 10*3/uL (ref 0.0–0.1)
Basophils Relative: 0 %
Eosinophils Absolute: 0.1 10*3/uL (ref 0.0–0.5)
Eosinophils Relative: 1 %
HCT: 31.4 % — ABNORMAL LOW (ref 39.0–52.0)
Hemoglobin: 10.3 g/dL — ABNORMAL LOW (ref 13.0–17.0)
Immature Granulocytes: 1 %
Lymphocytes Relative: 19 %
Lymphs Abs: 1.2 10*3/uL (ref 0.7–4.0)
MCH: 28.9 pg (ref 26.0–34.0)
MCHC: 32.8 g/dL (ref 30.0–36.0)
MCV: 88.2 fL (ref 80.0–100.0)
Monocytes Absolute: 0.8 10*3/uL (ref 0.1–1.0)
Monocytes Relative: 12 %
Neutro Abs: 4.4 10*3/uL (ref 1.7–7.7)
Neutrophils Relative %: 67 %
Platelets: 297 10*3/uL (ref 150–400)
RBC: 3.56 MIL/uL — ABNORMAL LOW (ref 4.22–5.81)
RDW: 16.2 % — ABNORMAL HIGH (ref 11.5–15.5)
WBC: 6.5 10*3/uL (ref 4.0–10.5)
nRBC: 0 % (ref 0.0–0.2)

## 2021-06-10 LAB — CULTURE, BLOOD (ROUTINE X 2)
Culture: NO GROWTH
Culture: NO GROWTH
Special Requests: ADEQUATE

## 2021-06-10 LAB — GLUCOSE, CAPILLARY
Glucose-Capillary: 256 mg/dL — ABNORMAL HIGH (ref 70–99)
Glucose-Capillary: 191 mg/dL — ABNORMAL HIGH (ref 70–99)

## 2021-06-10 MED ORDER — LISINOPRIL-HYDROCHLOROTHIAZIDE 10-12.5 MG PO TABS
1.0000 | ORAL_TABLET | Freq: Every day | ORAL | 6 refills | Status: DC
  Filled 2021-06-10: qty 30, 30d supply, fill #0
  Filled 2021-07-08: qty 30, 30d supply, fill #1
  Filled 2021-08-08: qty 30, 30d supply, fill #2
  Filled 2021-09-05: qty 30, 30d supply, fill #0

## 2021-06-10 MED ORDER — INSULIN DETEMIR 100 UNIT/ML FLEXPEN: 22.0000 [IU] | PEN_INJECTOR | Freq: Every day | SUBCUTANEOUS | 0 refills | Status: DC

## 2021-06-10 MED ORDER — METOPROLOL TARTRATE 50 MG PO TABS
ORAL_TABLET | Freq: Two times a day (BID) | ORAL | 6 refills | Status: DC
  Filled 2021-06-10: qty 60, 30d supply, fill #0
  Filled 2021-07-08: qty 60, 30d supply, fill #1
  Filled 2021-09-05: qty 60, 30d supply, fill #0
  Filled 2021-10-08 – 2021-10-21 (×2): qty 60, 30d supply, fill #1
  Filled 2022-02-03: qty 60, 30d supply, fill #2
  Filled 2022-02-28: qty 60, 30d supply, fill #3
  Filled 2022-04-22: qty 60, 30d supply, fill #4

## 2021-06-10 MED FILL — Atorvastatin Calcium Tab 20 MG (Base Equivalent): ORAL | 30 days supply | Qty: 30 | Fill #2 | Status: AC

## 2021-06-10 NOTE — Progress Notes (Signed)
Pt w multiple complaints this am - doesn't feel good, didn't sleep well, throat is sore, etc. Pt continues to be incontinent of stool - again encouraged pt to be up OOB, to sit in recliner for as long as possible to avoid deconditioning and other complications of bedrest. He verbalized understanding.

## 2021-06-10 NOTE — Progress Notes (Addendum)
Pt complained of feeling mildly short of breath. O2 sats were 100% on room air. Pt states that it is getting better and has happened before,and  thinks its related to his upper respiratory infection.  Provider notified, states to continue to monitor and page for PRN nebulizer treatment if needed.States that patient is on Augmentin already for presumed respiratory infection. Will continue to monitor, call bell within reach.

## 2021-06-10 NOTE — Discharge Summary (Signed)
Physician Discharge Summary   Patient name: Taylor Hughes  Admit date:     06/04/2021  Discharge date: 06/10/2021  Discharge Physician: Dwyane Dee   PCP: Charlott Rakes, MD   Recommendations at discharge:  Adjust insulin regimen further as needed  Discharge Diagnoses Active Problems:   DMII (diabetes mellitus, type 2) ([place])   URI (upper respiratory infection)   Essential hypertension   PAF (paroxysmal atrial fibrillation) (HCC)   Hypokalemia   Normocytic anemia   Resolved Diagnoses Principal Problem (Resolved):   DKA (diabetic ketoacidosis) ([place]) Resolved Problems:   AKI (acute kidney injury) ([place])   Hyperkalemia   Acute metabolic encephalopathy   Hospital Course   [name] is a 50 year old male with PMH obesity, DM II, GERD, afib who presented to the hospital with upper respiratory symptoms that began approximately 1 week prior.  Due to worsening breathing, he had presented for further evaluation.  He also reported that he had stopped taking his insulin at home due to his poor appetite and decreased intake. On work-up in the ER he was found to be in severe DKA with hyperkalemia.  He was admitted to the ICU and started on DKA protocol. See below for further A&P.   * DKA (diabetic ketoacidosis) (HCC)-resolved as of 06/07/2021 - suspicion on admission was due to URI symptoms and not taking any insulin for a few days - has responded well to insulin gtt and IVF - transitioned off insulin drip on 10/28  DMII (diabetes mellitus, type 2) (HCC) - Hemoglobin A1c 9.4% on admission.  Improved from 2020 - Patient states he takes 22 units Levemir at home and sliding scale insulin -See DKA - Discharged home on home insulin regimen.  Lifestyle modifications recommended including diet and any exercise as tolerated  AKI (acute kidney injury) (HCC)-resolved as of 06/09/2021 - baseline creatinine ~ 0.9 - patient presents with increase in creat >0.3 mg/dL above baseline, creat  increase >1.5x baseline presumed to have occurred within past 7 days PTA -Creatinine 2.65 on admission.  Etiology presumed to be prerenal from poor oral intake prior to admission - Creatinine slowly improving - Continue diet which is now resumed  URI (upper respiratory infection) - suspected viral on admission but had significantly elevated WBC and PCT also elevated -Completed antibiotic course in the hospital  Normocytic anemia - Chronic - Baseline around 11 g/dL  Hypokalemia - Repleting as needed  PAF (paroxysmal atrial fibrillation) ([place]) - Not on anticoagulation - Continue Lopressor  Essential hypertension - Continue current regimen  Acute metabolic encephalopathy-resolved as of 06/07/2021 - Presumed due to DKA and metabolic derangements on admission - Has resolved with treatment  Hyperkalemia-resolved as of 06/07/2021 - Resolved with treatment on admission      Condition at discharge: stable  Exam Physical Exam Constitutional:      General: He is not in acute distress.    Appearance: Normal appearance. He is not ill-appearing.  HENT:     Head: Normocephalic and atraumatic.     Mouth/Throat:     Mouth: Mucous membranes are moist.  Eyes:     Extraocular Movements: Extraocular movements intact.  Cardiovascular:     Rate and Rhythm: Normal rate and regular rhythm.  Pulmonary:     Effort: Pulmonary effort is normal.     Breath sounds: Normal breath sounds.  Abdominal:     General: Bowel sounds are normal. There is no distension.     Palpations: Abdomen is soft.     Tenderness: There is  no abdominal tenderness.  Musculoskeletal:        General: Normal range of motion.     Cervical back: Normal range of motion and neck supple.  Skin:    General: Skin is warm and dry.  Neurological:     General: No focal deficit present.     Mental Status: He is alert.  Psychiatric:        Mood and Affect: Mood normal.        Behavior: Behavior normal.     Disposition:  Home  Discharge time: greater than 30 minutes.   Allergies as of 06/10/2021   No Known Allergies      Medication List     STOP taking these medications    aspirin 325 MG tablet   multivitamin with minerals Tabs tablet       TAKE these medications    acetaminophen 325 MG tablet Commonly known as: TYLENOL Take 650 mg by mouth every 6 (six) hours as needed for mild pain, fever or headache.   atorvastatin 20 MG tablet Commonly known as: LIPITOR TAKE 1 TABLET (20 MG TOTAL) BY MOUTH DAILY. What changed: Another medication with the same name was removed. Continue taking this medication, and follow the directions you see here.   gabapentin 300 MG capsule Commonly known as: NEURONTIN TAKE 1 CAPSULE (300 MG TOTAL) BY MOUTH 2 (TWO) TIMES DAILY. What changed: how much to take   insulin detemir 100 UNIT/ML FlexPen Commonly known as: LEVEMIR Inject 22 Units into the skin daily. What changed:  how much to take how to take this when to take this Another medication with the same name was removed. Continue taking this medication, and follow the directions you see here.   Insulin Pen Needle 32G X 5 MM Misc Use with levemir pen as prescribed   lisinopril-hydrochlorothiazide 10-12.5 MG tablet Commonly known as: ZESTORETIC TAKE 1 TABLET BY MOUTH DAILY. What changed: Another medication with the same name was removed. Continue taking this medication, and follow the directions you see here.   metoprolol tartrate 50 MG tablet Commonly known as: LOPRESSOR TAKE 1 TABLET (50 MG TOTAL) BY MOUTH 2 (TWO) TIMES DAILY. What changed: how much to take   NovoLOG FlexPen 100 UNIT/ML FlexPen Generic drug: insulin aspart Inject 0-12 units into the skin three times daily with meals. Per sliding scale. What changed: Another medication with the same name was removed. Continue taking this medication, and follow the directions you see here.   True Metrix Blood Glucose Test test strip Generic  drug: glucose blood Use as directed   True Metrix Meter w/Device Kit Use to check blood sugar three times daily.   TRUEplus Lancets 28G Misc Use to check blood sugar daily.        DG CHEST PORT 1 VIEW  Result Date: 06/06/2021 CLINICAL DATA:  Shortness of breath.  DKA. EXAM: PORTABLE CHEST 1 VIEW COMPARISON:  06/05/2021 FINDINGS: Stable cardiomediastinal contours. Diffuse pulmonary vascular congestion. No pleural effusion or edema. No airspace densities identified. Visualized osseous structures are intact. IMPRESSION: Diffuse pulmonary vascular congestion. Electronically Signed   By: Kerby Moors M.D.   On: 06/06/2021 08:13   DG Chest Port 1 View  Result Date: 06/05/2021 CLINICAL DATA:  Hypoxemia EXAM: PORTABLE CHEST 1 VIEW COMPARISON:  Yesterday FINDINGS: Normal heart size and aortic contours. Stable hilar prominence which appears vascular and branching. There is no edema, consolidation, effusion, or pneumothorax. Artifact from EKG leads. IMPRESSION: Stable hilar prominence favoring vascular congestion. Electronically Signed  By: Jorje Guild M.D.   On: 06/05/2021 05:42   DG Chest Port 1 View  Result Date: 06/04/2021 CLINICAL DATA:  Shortness of breath, unresponsive. EXAM: PORTABLE CHEST 1 VIEW COMPARISON:  June 30, 2019 FINDINGS: Mildly decreased lung volumes are seen. There is no evidence of acute infiltrate, pleural effusion or pneumothorax. Prominence of the right hilar pulmonary vasculature is seen the heart size and mediastinal contours are within normal limits. Chronic right-sided rib fractures are noted. IMPRESSION: Prominence of the right hilar pulmonary vasculature without acute or active cardiopulmonary disease. Electronically Signed   By: Virgina Norfolk M.D.   On: 06/04/2021 23:47   Results for orders placed or performed during the hospital encounter of 06/04/21  Resp Panel by RT-PCR (Flu A&B, Covid) Nasopharyngeal Swab     Status: None   Collection Time:  06/04/21 11:31 PM   Specimen: Nasopharyngeal Swab; Nasopharyngeal(NP) swabs in vial transport medium  Result Value Ref Range Status   SARS Coronavirus 2 by RT PCR NEGATIVE NEGATIVE Final    Comment: (NOTE) SARS-CoV-2 target nucleic acids are NOT DETECTED.  The SARS-CoV-2 RNA is generally detectable in upper respiratory specimens during the acute phase of infection. The lowest concentration of SARS-CoV-2 viral copies this assay can detect is 138 copies/mL. A negative result does not preclude SARS-Cov-2 infection and should not be used as the sole basis for treatment or other patient management decisions. A negative result may occur with  improper specimen collection/handling, submission of specimen other than nasopharyngeal swab, presence of viral mutation(s) within the areas targeted by this assay, and inadequate number of viral copies(<138 copies/mL). A negative result must be combined with clinical observations, patient history, and epidemiological information. The expected result is Negative.  Fact Sheet for Patients:  EntrepreneurPulse.com.au  Fact Sheet for Healthcare Providers:  IncredibleEmployment.be  This test is no t yet approved or cleared by the [place] FDA and  has been authorized for detection and/or diagnosis of SARS-CoV-2 by FDA under an Emergency Use Authorization (EUA). This EUA will remain  in effect (meaning this test can be used) for the duration of the COVID-19 declaration under Section 564(b)(1) of the Act, 21 U.S.C.section 360bbb-3(b)(1), unless the authorization is terminated  or revoked sooner.       Influenza A by PCR NEGATIVE NEGATIVE Final   Influenza B by PCR NEGATIVE NEGATIVE Final    Comment: (NOTE) The Xpert Xpress SARS-CoV-2/FLU/RSV plus assay is intended as an aid in the diagnosis of influenza from Nasopharyngeal swab specimens and should not be used as a sole basis for treatment. Nasal washings  and aspirates are unacceptable for Xpert Xpress SARS-CoV-2/FLU/RSV testing.  Fact Sheet for Patients: EntrepreneurPulse.com.au  Fact Sheet for Healthcare Providers: IncredibleEmployment.be  This test is not yet approved or cleared by the [place] FDA and has been authorized for detection and/or diagnosis of SARS-CoV-2 by FDA under an Emergency Use Authorization (EUA). This EUA will remain in effect (meaning this test can be used) for the duration of the COVID-19 declaration under Section 564(b)(1) of the Act, 21 U.S.C. section 360bbb-3(b)(1), unless the authorization is terminated or revoked.  Performed at [facility], [place] [address] [place], [place] [address]   Culture, blood (routine x 2)     Status: None   Collection Time: 06/05/21  3:00 AM   Specimen: BLOOD  Result Value Ref Range Status   Specimen Description   Final    BLOOD RIGHT ANTECUBITAL Performed at [place]  [address], [place], [place] [address]    Special Requests   Final    BOTTLES DRAWN AEROBIC AND ANAEROBIC Blood Culture results may not be optimal due to an excessive volume of blood received in culture bottles Performed at [place] [address], [place], [place] [address]    Culture   Final    NO GROWTH 5 DAYS Performed at [facility], [place] [address], [place], [place] [address]    Report Status 06/10/2021 FINAL  Final  MRSA Next Gen by PCR, Nasal     Status: None   Collection Time: 06/05/21  4:00 AM   Specimen: Nasal Mucosa; Nasal Swab  Result Value Ref Range Status   MRSA by PCR Next Gen NOT DETECTED NOT DETECTED Final    Comment: (NOTE) The GeneXpert MRSA Assay (FDA approved for NASAL specimens only), is one component of a comprehensive MRSA colonization surveillance program. It is not intended to diagnose MRSA infection nor to guide or monitor treatment for MRSA infections. Test  performance is not FDA approved in patients less than 57 years old. Performed at [facility], [place] [address] [place], [place] [address]   Culture, blood (routine x 2)     Status: None   Collection Time: 06/05/21  5:28 AM   Specimen: BLOOD  Result Value Ref Range Status   Specimen Description   Final    BLOOD BLOOD LEFT HAND Performed at [place] [address], [place], [place] [address]    Special Requests   Final    BOTTLES DRAWN AEROBIC ONLY Blood Culture adequate volume Performed at [facility] [address], [place], [place] [address]    Culture   Final    NO GROWTH 5 DAYS Performed at [facility], [place] [address] [place], [place] [address]    Report Status 06/10/2021 FINAL  Final  Respiratory (~20 pathogens) panel by PCR     Status: None   Collection Time: 06/05/21  5:59 PM   Specimen: Nasopharyngeal Swab; Respiratory  Result Value Ref Range Status   Adenovirus NOT DETECTED NOT DETECTED Final   Coronavirus 229E NOT DETECTED NOT DETECTED Final    Comment: (NOTE) The Coronavirus on the Respiratory Panel, DOES NOT test for the novel  Coronavirus (2019 nCoV)    Coronavirus HKU1 NOT DETECTED NOT DETECTED Final   Coronavirus NL63 NOT DETECTED NOT DETECTED Final   Coronavirus OC43 NOT DETECTED NOT DETECTED Final   Metapneumovirus NOT DETECTED NOT DETECTED Final   Rhinovirus / Enterovirus NOT DETECTED NOT DETECTED Final   Influenza A NOT DETECTED NOT DETECTED Final   Influenza B NOT DETECTED NOT DETECTED Final   Parainfluenza Virus 1 NOT DETECTED NOT DETECTED Final   Parainfluenza Virus 2 NOT DETECTED NOT DETECTED Final   Parainfluenza Virus 3 NOT DETECTED NOT DETECTED Final   Parainfluenza Virus 4 NOT DETECTED NOT DETECTED Final   Respiratory Syncytial Virus NOT DETECTED NOT DETECTED Final   Bordetella pertussis NOT DETECTED NOT DETECTED Final   Bordetella Parapertussis NOT DETECTED NOT DETECTED Final    Chlamydophila pneumoniae NOT DETECTED NOT DETECTED Final   Mycoplasma pneumoniae NOT DETECTED NOT DETECTED Final    Comment: Performed at [facility], [place] [address] [place], [place] [address]    Signed:  Dwyane Dee MD.  [facility] 06/10/2021, 2:01 PM

## 2021-06-10 NOTE — Progress Notes (Signed)
Reviewed written d/c instructions w pt and all questions answered. He verbalized understanding. D/C per w/c w all belongings in stable condition.

## 2021-06-10 NOTE — Plan of Care (Signed)

## 2021-06-11 ENCOUNTER — Telehealth: Payer: Self-pay

## 2021-06-11 LAB — GLUCOSE, CAPILLARY: Glucose-Capillary: 571 mg/dL (ref 70–99)

## 2021-06-11 NOTE — Telephone Encounter (Signed)
Transition Care Management Unsuccessful Follow-up Telephone Call  Date of discharge and from where:  06/10/2021, [facility]   Attempts:  1st Attempt  Reason for unsuccessful TCM follow-up call:  Left voice message on # [phone]. Call back requested to this CM.  Need to discuss scheduling a hospital follow up appointment with Dr Margarita Rana

## 2021-06-12 ENCOUNTER — Ambulatory Visit: Payer: Self-pay | Admitting: *Deleted

## 2021-06-12 ENCOUNTER — Telehealth: Payer: Self-pay

## 2021-06-12 NOTE — Telephone Encounter (Signed)
Reason for Disposition  [1] MODERATE leg swelling (e.g., swelling extends up to knees) AND [2] new-onset or worsening  Answer Assessment - Initial Assessment Questions 1. ONSET: "When did the swelling start?" (e.g., minutes, hours, days)     2009- getting worse 2. LOCATION: "What part of the leg is swollen?"  "Are both legs swollen or just one leg?"     Bilateral swelling-swelling above the knee 3. SEVERITY: "How bad is the swelling?" (e.g., localized; mild, moderate, severe)  - Localized - small area of swelling localized to one leg  - MILD pedal edema - swelling limited to foot and ankle, pitting edema < 1/4 inch (6 mm) deep, rest and elevation eliminate most or all swelling  - MODERATE edema - swelling of lower leg to knee, pitting edema > 1/4 inch (6 mm) deep, rest and elevation only partially reduce swelling  - SEVERE edema - swelling extends above knee, facial or hand swelling present      moderate 4. REDNESS: "Does the swelling look red or infected?"     Before hospitalization yes- better now 5. PAIN: "Is the swelling painful to touch?" If Yes, ask: "How painful is it?"   (Scale 1-10; mild, moderate or severe)     Pain is better 6. FEVER: "Do you have a fever?" If Yes, ask: "What is it, how was it measured, and when did it start?"      no 7. CAUSE: "What do you think is causing the leg swelling?"     Uncontrolled diabetes, fluid retention  8. MEDICAL HISTORY: "Do you have a history of heart failure, kidney disease, liver failure, or cancer?"     Diabetes- patient states it is all in chart 9. RECURRENT SYMPTOM: "Have you had leg swelling before?" If Yes, ask: "When was the last time?" "What happened that time?"     Yes- patient states it is all in chart 10. OTHER SYMPTOMS: "Do you have any other symptoms?" (e.g., chest pain, difficulty breathing)       Glucose elevated, in hospital patient had symptoms- they have improved 11. PREGNANCY: "Is there any chance you are pregnant?" "When  was your last menstrual period?"       na  Protocols used: Leg Swelling and Edema-A-AH

## 2021-06-12 NOTE — Telephone Encounter (Signed)
Transition Care Management Unsuccessful Follow-up Telephone Call  Date of discharge and from where:  06/10/2021, [facility]  Attempts:  2nd Attempt  Reason for unsuccessful TCM follow-up call:  Left voice message on # [phone].  Patient has appointment scheduled with Dr Margarita Rana tomorrow - 06/13/2021.

## 2021-06-13 ENCOUNTER — Telehealth: Payer: Self-pay

## 2021-06-13 ENCOUNTER — Other Ambulatory Visit: Payer: Self-pay

## 2021-06-13 ENCOUNTER — Encounter: Payer: Self-pay | Admitting: Family Medicine

## 2021-06-13 ENCOUNTER — Ambulatory Visit: Payer: Self-pay | Attending: Family Medicine | Admitting: Family Medicine

## 2021-06-13 VITALS — BP 133/87 | HR 97 | Ht 71.0 in | Wt 318.6 lb

## 2021-06-13 DIAGNOSIS — Z794 Long term (current) use of insulin: Secondary | ICD-10-CM

## 2021-06-13 DIAGNOSIS — E1169 Type 2 diabetes mellitus with other specified complication: Secondary | ICD-10-CM

## 2021-06-13 DIAGNOSIS — I10 Essential (primary) hypertension: Secondary | ICD-10-CM

## 2021-06-13 DIAGNOSIS — I890 Lymphedema, not elsewhere classified: Secondary | ICD-10-CM

## 2021-06-13 LAB — GLUCOSE, POCT (MANUAL RESULT ENTRY): POC Glucose: 271 mg/dl — AB (ref 70–99)

## 2021-06-13 MED ORDER — FUROSEMIDE 20 MG PO TABS
20.0000 mg | ORAL_TABLET | Freq: Every day | ORAL | 3 refills | Status: DC
  Filled 2021-06-13: qty 30, 30d supply, fill #0
  Filled 2021-07-08: qty 30, 30d supply, fill #1
  Filled 2021-08-08: qty 30, 30d supply, fill #2
  Filled 2021-09-05: qty 30, 30d supply, fill #0

## 2021-06-13 MED ORDER — INSULIN DETEMIR 100 UNIT/ML FLEXPEN
15.0000 [IU] | PEN_INJECTOR | Freq: Two times a day (BID) | SUBCUTANEOUS | 6 refills | Status: DC
  Filled 2021-06-13: qty 30, 100d supply, fill #0
  Filled 2021-07-25 – 2021-10-21 (×2): qty 27, 90d supply, fill #0
  Filled 2021-10-21: qty 9, 30d supply, fill #0
  Filled 2021-12-09: qty 9, 30d supply, fill #1
  Filled 2022-01-13: qty 9, 30d supply, fill #2
  Filled 2022-02-17: qty 9, 30d supply, fill #3
  Filled 2022-03-31: qty 9, 30d supply, fill #4

## 2021-06-13 NOTE — Progress Notes (Signed)
Subjective:  Patient ID: Taylor Hughes, male    DOB: 06-18-1971  Age: 50 y.o. MRN: [id number]  CC: Leg Swelling   HPI Taylor Hughes is a 50 y.o. year old male with a history of type 2 diabetes mellitus (A1c of 9.4), L fifth toe ray amputation, hypertension, neck abscess who is seen for transition of care visit. He was hospitalized 06/04/2021 through 06/10/2021 for DKA He had presented with upper respiratory symptoms and had not been taking his insulin due to decreased appetite.  He was found to be in DKA on presentation which required an ICU stay and treatment with IV fluids and insulin. He was also found to have acute chronic injury with an elevated creatinine of 2.65 up from his baseline of 0.93.and it trended down to 0.82 at discharge. Chest x-ray revealed prominence of right hilar pulmonary vasculature without active cardiopulmonary disease. He was treated with IV antibiotics during his hospital stay.  Interval History: He has bilateral pedal edema and is on his feet all day at work and he works 2 jobs.  He informs me that in the past he did have his legs wrapped with [place]na boots.  Blood sugars have been in the 250-270 range and he endorses compliance with his current diabetic regimen.  He has had no hypoglycemic episodes. Past Medical History:  Diagnosis Date   Atrial fibrillation ([place])    Diabetes mellitus without complication ([place])    Type 2   GERD (gastroesophageal reflux disease)    Wears glasses    Wound, open, foot    left diabetic     Past Surgical History:  Procedure Laterality Date   AMPUTATION Left 08/20/2018   Procedure: LEFT FOOT IRRIGATON AND DEBRIDEMENT, 5TH RAY AMPUTATION;  Surgeon: Newt Minion, MD;  Location: [place] [facility];  Service: Orthopedics;  Laterality: Left;   APPLICATION OF A-CELL OF CHEST/ABDOMEN N/A 05/30/2019   Procedure: Application Of A-Cell Of Chest;  Surgeon: Lajuana Matte, MD;  Location: [facility];  Service: Cardiothoracic;  Laterality: N/A;    APPLICATION OF WOUND VAC N/A 05/30/2019   Procedure: Application Of Wound Vac;  Surgeon: Lajuana Matte, MD;  Location: [facility];  Service: Cardiothoracic;  Laterality: N/A;   FINGER SURGERY Right 2016   I&D  small finger   I & D EXTREMITY Left 07/30/2018   Procedure: IRRIGATION AND DEBRIDEMENT LEFT FOOT WITH POSSIBLE AMPUTATION OF FIFTH TOE;  Surgeon: Mcarthur Rossetti, MD;  Location: [facility];  Service: Orthopedics;  Laterality: Left;   INCISION AND DRAINAGE ABSCESS Left 01/19/2019   Procedure: INCISION AND DRAINAGE ABSCESS upper chest;  Surgeon: Melida Quitter, MD;  Location: [facility];  Service: ENT;  Laterality: Left;   IRRIGATION AND DEBRIDEMENT STERNOCLAVICULAR JOINT-STERNUM AND RIBS N/A 05/30/2019   Procedure: IRRIGATION AND DEBRIDEMENT OF STERNOCLAVICULAR JOINT-STERNUM AND RIBS ;  Surgeon: Lajuana Matte, MD;  Location: [facility];  Service: Cardiothoracic;  Laterality: N/A;   MINOR IRRIGATION AND DEBRIDEMENT OF WOUND N/A 11/22/2018   Procedure: INCISION AND DRAINAGE OF NECK ABSCESS;  Surgeon: Melida Quitter, MD;  Location[facility];  Service: ENT;  Laterality: N/A;    Family History  Problem Relation Age of Onset   Diabetes Mother     No Known Allergies  Outpatient Medications Prior to Visit  Medication Sig Dispense Refill   atorvastatin (LIPITOR) 20 MG tablet TAKE 1 TABLET (20 MG TOTAL) BY MOUTH DAILY. 30 tablet 6   Blood Glucose Monitoring Suppl (TRUE METRIX METER) w/Device KIT Use to check  blood sugar three times daily. 1 kit 0   gabapentin (NEURONTIN) 300 MG capsule TAKE 1 CAPSULE (300 MG TOTAL) BY MOUTH 2 (TWO) TIMES DAILY. (Patient taking differently: Take 300 mg by mouth 2 (two) times daily.) 60 capsule 6   glucose blood (TRUE METRIX BLOOD GLUCOSE TEST) test strip Use as directed 100 strip 2   insulin aspart (NOVOLOG FLEXPEN) 100 UNIT/ML FlexPen Inject 0-12 units into the skin three times daily with meals. Per sliding scale. 30 mL 6   Insulin Pen Needle 32G X 5 MM MISC Use  with levemir pen as prescribed 100 each 0   lisinopril-hydrochlorothiazide (ZESTORETIC) 10-12.5 MG tablet TAKE 1 TABLET BY MOUTH DAILY. 30 tablet 6   metoprolol tartrate (LOPRESSOR) 50 MG tablet TAKE 1 TABLET (50 MG TOTAL) BY MOUTH 2 (TWO) TIMES DAILY. 60 tablet 6   TRUEplus Lancets 28G MISC Use to check blood sugar daily. 100 each 11   insulin detemir (LEVEMIR) 100 UNIT/ML FlexPen Inject 22 Units into the skin daily. 6 mL 0   acetaminophen (TYLENOL) 325 MG tablet Take 650 mg by mouth every 6 (six) hours as needed for mild pain, fever or headache. (Patient not taking: Reported on 06/13/2021)     No facility-administered medications prior to visit.     ROS Review of Systems  Constitutional:  Negative for activity change and appetite change.  HENT:  Negative for sinus pressure and sore throat.   Eyes:  Negative for visual disturbance.  Respiratory:  Negative for cough, chest tightness and shortness of breath.   Cardiovascular:  Positive for leg swelling. Negative for chest pain.  Gastrointestinal:  Negative for abdominal distention, abdominal pain, constipation and diarrhea.  Endocrine: Negative.   Genitourinary:  Negative for dysuria.  Musculoskeletal:  Negative for joint swelling and myalgias.  Skin:  Negative for rash.  Allergic/Immunologic: Negative.   Neurological:  Negative for weakness, light-headedness and numbness.  Psychiatric/Behavioral:  Negative for dysphoric mood and suicidal ideas.    Objective:  BP 133/87   Pulse 97   Ht 5' 11"  (1.803 m)   Wt (!) 318 lb 9.6 oz (144.5 kg)   SpO2 100%   BMI 44.44 kg/m   BP/Weight 06/13/2021 06/10/2021 11/04/7122  Systolic BP 580 998 338  Diastolic BP 87 91 98  Wt. (Lbs) 318.6 316.8 275  BMI 44.44 44.18 38.35      Physical Exam Constitutional:      Appearance: He is well-developed.  Cardiovascular:     Rate and Rhythm: Normal rate.     Heart sounds: Normal heart sounds. No murmur heard. Pulmonary:     Effort: Pulmonary  effort is normal.     Breath sounds: Normal breath sounds. No wheezing or rales.  Chest:     Chest wall: No tenderness.  Abdominal:     General: Bowel sounds are normal. There is no distension.     Palpations: Abdomen is soft. There is no mass.     Tenderness: There is no abdominal tenderness.  Musculoskeletal:        General: Normal range of motion.     Right lower leg: Edema present.     Left lower leg: Edema present.     Comments: Bilateral lymphedema  Neurological:     Mental Status: He is alert and oriented to person, place, and time.  Psychiatric:        Mood and Affect: Mood normal.    CMP Latest Ref Rng & Units 06/10/2021 06/09/2021 06/08/2021  Glucose 70 -  99 mg/dL 180(H) 272(H) 248(H)  BUN 6 - 20 mg/dL 13 18 25(H)  Creatinine 0.61 - 1.24 mg/dL 0.82 0.93 1.05  Sodium 135 - 145 mmol/L 135 136 137  Potassium 3.5 - 5.1 mmol/L 4.2 3.7 3.9  Chloride 98 - 111 mmol/L 101 99 101  CO2 22 - 32 mmol/L 25 28 27   Calcium 8.9 - 10.3 mg/dL 8.1(L) 8.2(L) 8.3(L)  Total Protein 6.5 - 8.1 g/dL - - -  Total Bilirubin 0.3 - 1.2 mg/dL - - -  Alkaline Phos 38 - 126 U/L - - -  AST 15 - 41 U/L - - -  ALT 0 - 44 U/L - - -    Lipid Panel  No results found for: CHOL, TRIG, HDL, CHOLHDL, VLDL, LDLCALC, LDLDIRECT  CBC    Component Value Date/Time   WBC 6.5 06/10/2021 0710   RBC 3.56 (L) 06/10/2021 0710   HGB 10.3 (L) 06/10/2021 0710   HCT 31.4 (L) 06/10/2021 0710   PLT 297 06/10/2021 0710   MCV 88.2 06/10/2021 0710   MCH 28.9 06/10/2021 0710   MCHC 32.8 06/10/2021 0710   RDW 16.2 (H) 06/10/2021 0710   LYMPHSABS 1.2 06/10/2021 0710   MONOABS 0.8 06/10/2021 0710   EOSABS 0.1 06/10/2021 0710   BASOSABS 0.0 06/10/2021 0710    Lab Results  Component Value Date   HGBA1C 9.4 (H) 06/06/2021    Assessment & Plan:  1. Type 2 diabetes mellitus with other specified complication, with long-term current use of insulin (HCC) Uncontrolled with A1c of 9.4; goal is <7.0 Increase Levemir to  15 units bid Continue Novolog slidng scale Will reassess blood sugar in 6 weeks Counseled on Diabetic diet, my plate method, 644 minutes of moderate intensity exercise/week Blood sugar logs with fasting goals of 80-120 mg/dl, random of less than 180 and in the event of sugars less than 60 mg/dl or greater than 400 mg/dl encouraged to notify the clinic. Advised on the need for annual eye exams, annual foot exams, Pneumonia vaccine. - insulin detemir (LEVEMIR) 100 UNIT/ML FlexPen; Inject 15 Units into the skin 2 (two) times daily.  Dispense: 30 mL; Refill: 6 - POCT glucose (manual entry)  2. Essential hypertension Controlled Continue current regimen Counseled on blood pressure goal of less than 130/80, low-sodium, DASH diet, medication compliance, 150 minutes of moderate intensity exercise per week. Discussed medication compliance, adverse effects. - Basic Metabolic Panel; Future  3. Morbid obesity ([place]) Counseled on increasing physical activity, decreasing portion sizes  4. Lymphedema He is on his feet for prolonged hours which can exacerbate this He will benefit from a vascular surgery referral and has been advised to apply for the [facility] financial discount to facilitate referral Encouraged to comply with a low-sodium diet, elevate feet, use compression stockings - furosemide (LASIX) 20 MG tablet; Take 1 tablet (20 mg total) by mouth daily.  Dispense: 30 tablet; Refill: 3   Meds ordered this encounter  Medications   furosemide (LASIX) 20 MG tablet    Sig: Take 1 tablet (20 mg total) by mouth daily.    Dispense:  30 tablet    Refill:  3   insulin detemir (LEVEMIR) 100 UNIT/ML FlexPen    Sig: Inject 15 Units into the skin 2 (two) times daily.    Dispense:  30 mL    Refill:  6    Follow-up: Return in about 6 weeks (around 07/25/2021) for Diabetes mellitus.       Charlott Rakes, MD, FAAFP. [facility]  [facility] and [place], [place]    06/13/2021, 12:36 PM

## 2021-06-13 NOTE — Progress Notes (Signed)
Swelling and pain in legs.  Medication refills.

## 2021-06-13 NOTE — Patient Instructions (Signed)
Lymphedema Lymphedema is swelling that is caused by the abnormal collection of lymph in the tissues under the skin. Lymph is excess fluid from the tissues in your body that is removed through the lymphatic system. This system is part of your body's defense system (immune system) and includes lymph nodes and lymph vessels. The lymph vessels collect and carry the excess fluid, fats, proteins, and waste from the tissues of the body to the bloodstream. This system also works to clean and remove bacteria and waste products from the body. Lymphedema occurs when the lymphatic system is blocked. When the lymph vessels or lymph nodes are blocked or damaged, lymph does not drain properly. This causes an abnormal buildup of lymph, which leads to swelling in the affected area. This may include the trunk area, or an arm or leg. Lymphedema cannot be cured by medicines, but various methods can be used to help reduce the swelling. What are the causes? The cause of this condition depends on the type of lymphedema that you have. Primary lymphedema is caused by the absence of lymph vessels or having abnormal lymph vessels at birth. Secondary lymphedema occurs when lymph vessels are blocked or damaged. Secondary lymphedema is more common. Common causes of lymph vessel blockage include: Skin infection, such as cellulitis. Infection by parasites (filariasis). Injury. Radiation therapy. Cancer. Formation of scar tissue. Surgery. What are the signs or symptoms? Symptoms of this condition include: Swelling of the arm or leg. A heavy or tight feeling in the arm or leg. Swelling of the feet, toes, or fingers. Shoes or rings may fit more tightly than before. Redness of the skin over the affected area. Limited movement of the affected limb. Sensitivity to touch or discomfort in the affected limb. How is this diagnosed? This condition may be diagnosed based on: Your symptoms and medical history. A physical  exam. Bioimpedance spectroscopy. In this test, painless electrical currents are used to measure fluid levels in your body. Imaging tests, such as: MRI. CT scan. Duplex ultrasound. This test uses sound waves to produce images of the vessels and the blood flow on a screen. Lymphoscintigraphy. In this test, a low dose of a radioactive substance is injected to trace the flow of lymph through your lymph vessels. Lymphangiography. In this test, a contrast dye is injected into the lymph vessel to help show blockages. How is this treated? If an underlying condition is causing the lymphedema, that condition will be treated. For example, antibiotic medicines may be used to treat an infection. Treatment for this condition will depend on the cause of your lymphedema. Treatment may include: Complete decongestive therapy (CDT). This is done by a certified lymphedema therapist to reduce fluid congestion. This therapy includes: Skin care. Compression wrapping of the affected area. Manual lymph drainage. This is a special massage technique that promotes lymph drainage out of a limb. Specific exercises. Certain exercises can help fluid move out of the affected limb. Compression. Various methods may be used to apply pressure to the affected limb to reduce the swelling. They include: Wearing compression stockings or sleeves on the affected limb. Wrapping the affected limb with special bandages. Surgery. This is usually done for severe cases only. For example, surgery may be done if you have trouble moving the limb or if the swelling does not get better with other treatments. Follow these instructions at home: Self-care The affected area is more likely to become injured or infected. Take these steps to help prevent infection: Keep the affected area clean   and dry. Use approved creams or lotions to keep the skin moisturized. Protect your skin from cuts: Use gloves while cooking or gardening. Do not walk  barefoot. If you shave the affected area, use an electric razor. Do not wear tight clothes, shoes, or jewelry. Eat a healthy diet that includes a lot of fruits and vegetables. Activity Do exercises as told by your health care provider. Do not sit with your legs crossed. When possible, keep the affected limb raised (elevated) above the level of your heart. Avoid carrying things with an arm that is affected by lymphedema. General instructions Wear compression stockings or sleeves as told by your health care provider. Note any changes in size of the affected limb. You may be instructed to take regular measurements and keep track of them. Take over-the-counter and prescription medicines only as told by your health care provider. If you were prescribed an antibiotic medicine, take or apply it as told by your health care provider. Do not stop using the antibiotic even if you start to feel better or if your condition improves. Do not use heating pads or ice packs on the affected area. Avoid having blood draws, IV insertions, or blood pressure checks on the affected limb. Keep all follow-up visits. This is important. Contact a health care provider if you: Continue to have swelling in your limb. Have fluid leaking from the skin of your swollen limb. Have a cut that does not heal. Have redness or pain in the affected area. Develop purplish spots, rash, blisters, or sores (lesions) on your affected limb. Get help right away if you: Have new swelling in your limb that starts suddenly. Have shortness of breath or chest pain. Have a fever or chills. These symptoms may represent a serious problem that is an emergency. Do not wait to see if the symptoms will go away. Get medical help right away. Call your local emergency services (911 in the [place] Do not drive yourself to the hospital. Summary Lymphedema is swelling that is caused by the abnormal collection of lymph in the tissues under the  skin. Lymph is fluid from the tissues in your body that is removed through the lymphatic system. This system collects and carries excess fluid, fats, proteins, and wastes from the tissues of the body to the bloodstream. Lymphedema causes swelling, pain, and redness in the affected area. This may include the trunk area, or an arm or leg. Treatment for this condition may depend on the cause of your lymphedema. Treatment may include treating the underlying cause, complete decongestive therapy (CDT), compression methods, or surgery. This information is not intended to replace advice given to you by your health care provider. Make sure you discuss any questions you have with your health care provider. Document Revised: 05/23/2020 Document Reviewed: 05/23/2020 Elsevier Patient Education  2022 [employer].  

## 2021-06-13 NOTE — Telephone Encounter (Signed)
Transition Care Management Follow-up Telephone Call Date of discharge and from where: 06/10/2021, [facility]  Patient was seen by PCP today.

## 2021-06-17 ENCOUNTER — Ambulatory Visit: Payer: Self-pay

## 2021-06-17 ENCOUNTER — Other Ambulatory Visit: Payer: Self-pay

## 2021-06-17 DIAGNOSIS — R0609 Other forms of dyspnea: Secondary | ICD-10-CM

## 2021-06-17 MED FILL — Gabapentin Cap 300 MG: ORAL | 30 days supply | Qty: 60 | Fill #0 | Status: AC

## 2021-06-17 NOTE — Telephone Encounter (Signed)
Answer Assessment - Initial Assessment Questions 1. RESPIRATORY STATUS: "Describe your breathing?" (e.g., wheezing, shortness of breath, unable to speak, severe coughing)      Having SOB at night while sleeping 2. ONSET: "When did this breathing problem begin?"      After release from the hospital 3. PATTERN "Does the difficult breathing come and go, or has it been constant since it started?"      Come and go 4. SEVERITY: "How bad is your breathing?" (e.g., mild, moderate, severe)    - MILD: No SOB at rest, mild SOB with walking, speaks normally in sentences, can lie down, no retractions, pulse < 100.    - MODERATE: SOB at rest, SOB with minimal exertion and prefers to sit, cannot lie down flat, speaks in phrases, mild retractions, audible wheezing, pulse 100-120.    - SEVERE: Very SOB at rest, speaks in single words, struggling to breathe, sitting hunched forward, retractions, pulse > 120      mild 5. RECURRENT SYMPTOM: "Have you had difficulty breathing before?" If Yes, ask: "When was the last time?" and "What happened that time?"      At night 6. CARDIAC HISTORY: "Do you have any history of heart disease?" (e.g., heart attack, angina, bypass surgery, angioplasty)       7. LUNG HISTORY: "Do you have any history of lung disease?"  (e.g., pulmonary embolus, asthma, emphysema)      8. CAUSE: "What do you think is causing the breathing problem?"      Unknown 9. OTHER SYMPTOMS: "Do you have any other symptoms? (e.g., dizziness, runny nose, cough, chest pain, fever)      10. O2 SATURATION MONITOR:  "Do you use an oxygen saturation monitor (pulse oximeter) at home?" If Yes, "What is your reading (oxygen level) today?" "What is your usual oxygen saturation reading?" (e.g., 95%)        11. PREGNANCY: "Is there any chance you are pregnant?" "When was your last menstrual period?"        12. TRAVEL: "Have you traveled out of the country in the last month?" (e.g., travel history,  exposures)  Protocols used: Breathing Difficulty-A-AH

## 2021-06-17 NOTE — Telephone Encounter (Signed)
Patient was seen by Dr. Margarita Rana last week. At that time pt requested  an inhaler. Pt spoke to Dr. Smitty Pluck nurse.   Pt states that at night while sleeping he sometimes has SOB while sleeping. He states that when this happens he adjusts his sleeping position, and SOB resolves. He would like to have an inhaler "just in case".   Please advise.

## 2021-06-18 ENCOUNTER — Other Ambulatory Visit: Payer: Self-pay | Admitting: Pharmacist

## 2021-06-18 ENCOUNTER — Other Ambulatory Visit: Payer: Self-pay

## 2021-06-18 MED ORDER — ALBUTEROL SULFATE HFA 108 (90 BASE) MCG/ACT IN AERS
2.0000 | INHALATION_SPRAY | Freq: Four times a day (QID) | RESPIRATORY_TRACT | 0 refills | Status: DC | PRN
  Filled 2021-06-18: qty 18, 25d supply, fill #0

## 2021-06-18 MED ORDER — ALBUTEROL SULFATE HFA 108 (90 BASE) MCG/ACT IN AERS
2.0000 | INHALATION_SPRAY | Freq: Four times a day (QID) | RESPIRATORY_TRACT | 0 refills | Status: DC | PRN
  Filled 2021-06-18: qty 8, fill #0

## 2021-06-18 NOTE — Telephone Encounter (Signed)
Called pt made aware of Rx sent to pharmacy

## 2021-06-18 NOTE — Addendum Note (Signed)
Addended by: Charlott Rakes on: 06/18/2021 12:28 PM   Modules accepted: Orders

## 2021-06-18 NOTE — Telephone Encounter (Signed)
Done

## 2021-07-08 ENCOUNTER — Other Ambulatory Visit: Payer: Self-pay

## 2021-07-08 MED FILL — Atorvastatin Calcium Tab 20 MG (Base Equivalent): ORAL | 30 days supply | Qty: 30 | Fill #3 | Status: AC

## 2021-07-12 ENCOUNTER — Other Ambulatory Visit: Payer: Self-pay

## 2021-07-18 ENCOUNTER — Other Ambulatory Visit: Payer: Self-pay

## 2021-07-18 MED FILL — Gabapentin Cap 300 MG: ORAL | 30 days supply | Qty: 60 | Fill #1 | Status: AC

## 2021-07-19 ENCOUNTER — Other Ambulatory Visit: Payer: Self-pay

## 2021-07-25 ENCOUNTER — Other Ambulatory Visit: Payer: Self-pay

## 2021-07-25 MED FILL — Insulin Aspart Soln Pen-injector 100 Unit/ML: SUBCUTANEOUS | 92 days supply | Qty: 33 | Fill #5 | Status: AC

## 2021-07-29 ENCOUNTER — Other Ambulatory Visit: Payer: Self-pay | Admitting: Family Medicine

## 2021-07-29 ENCOUNTER — Other Ambulatory Visit: Payer: Self-pay

## 2021-07-29 MED ORDER — TRUE METRIX BLOOD GLUCOSE TEST VI STRP
1.0000 | ORAL_STRIP | 2 refills | Status: DC
  Filled 2021-07-29 – 2021-08-28 (×2): qty 100, 30d supply, fill #0
  Filled 2021-09-30: qty 100, 30d supply, fill #1

## 2021-07-30 ENCOUNTER — Ambulatory Visit: Payer: Self-pay | Admitting: Family Medicine

## 2021-07-30 ENCOUNTER — Other Ambulatory Visit: Payer: Self-pay

## 2021-07-31 ENCOUNTER — Ambulatory Visit: Payer: Self-pay | Admitting: Family Medicine

## 2021-08-08 ENCOUNTER — Other Ambulatory Visit: Payer: Self-pay

## 2021-08-08 MED FILL — Atorvastatin Calcium Tab 20 MG (Base Equivalent): ORAL | 30 days supply | Qty: 30 | Fill #4 | Status: AC

## 2021-08-13 ENCOUNTER — Ambulatory Visit: Payer: Self-pay | Admitting: *Deleted

## 2021-08-13 NOTE — Telephone Encounter (Signed)
Reason for Disposition  [1] MILD pain (i.e., scale 1-3; does not interfere with normal activities) AND [2] present > 3 days  Answer Assessment - Initial Assessment Questions 1. LOCATION: "Where does it hurt?" (e.g., left, right)     I returned his call.   He called in c/o back pain and cloudy urine.  Having lower back pain 3 days on right side and toward my kidney.   My urine was cloudy for a couple of days.   I'm taking ibuprofen which is helping.   My urine is clear and the pain is better. 2. ONSET: "When did the pain start?"     3 days  3. SEVERITY: "How bad is the pain?" (e.g., Scale 1-10; mild, moderate, or severe)   - MILD (1-3): doesn't interfere with normal activities    - MODERATE (4-7): interferes with normal activities or awakens from sleep    - SEVERE (8-10): excruciating pain and patient unable to do normal activities (stays in bed)       *No Answer* 4. PATTERN: "Does the pain come and go, or is it constant?"      *No Answer* 5. CAUSE: "What do you think is causing the pain?"     *No Answer* 6. OTHER SYMPTOMS:  "Do you have any other symptoms?" (e.g., fever, abdominal pain, vomiting, leg weakness, burning with urination, blood in urine)     At night it's more frequent. 7. PREGNANCY:  "Is there any chance you are pregnant?" "When was your last menstrual period?"     *No Answer*  Protocols used: Flank Pain-A-AH

## 2021-08-13 NOTE — Telephone Encounter (Signed)
°  Chief Complaint: right sided flank pain and cloudy urine for 3 days.  Symptoms: The pain has gotten much better and his urine has cleared up while taking ibuprofen  Frequency: For 3 days but better now but wants to still have his kidneys and urine checked.   Pertinent Negatives: Patient denies blood or burning in/with urination Disposition: [] ED /[] Urgent Care (no appt availability in office) / [] Appointment(In office/virtual)/ []  [place] [facility]/ [] Home Care/ [] Refused Recommended Disposition /[] [place] [place] [facility]/ [x]  Follow-up with PCP Additional Notes: He has an appt at 08/15/2021 at 2:30 with Freeman Caldron scheduled by Selina Cooley at [facility].   I called pt back and let him know.  This works for him.    I sent my notes to [facility].

## 2021-08-15 ENCOUNTER — Other Ambulatory Visit (HOSPITAL_COMMUNITY)
Admission: RE | Admit: 2021-08-15 | Discharge: 2021-08-15 | Disposition: A | Discharge status: Home / Self Care | Payer: Self-pay | Source: Ambulatory Visit | Attending: Physician Assistant | Admitting: Physician Assistant

## 2021-08-15 ENCOUNTER — Ambulatory Visit: Payer: Self-pay | Attending: Physician Assistant | Admitting: Physician Assistant

## 2021-08-15 ENCOUNTER — Other Ambulatory Visit (HOSPITAL_COMMUNITY): Payer: Self-pay

## 2021-08-15 ENCOUNTER — Encounter: Payer: Self-pay | Admitting: Physician Assistant

## 2021-08-15 ENCOUNTER — Other Ambulatory Visit: Payer: Self-pay

## 2021-08-15 VITALS — BP 131/85 | HR 70 | Ht 71.0 in | Wt 324.2 lb

## 2021-08-15 DIAGNOSIS — R8281 Pyuria: Secondary | ICD-10-CM

## 2021-08-15 DIAGNOSIS — Z1211 Encounter for screening for malignant neoplasm of colon: Secondary | ICD-10-CM

## 2021-08-15 DIAGNOSIS — E1169 Type 2 diabetes mellitus with other specified complication: Secondary | ICD-10-CM

## 2021-08-15 DIAGNOSIS — R8290 Unspecified abnormal findings in urine: Secondary | ICD-10-CM

## 2021-08-15 DIAGNOSIS — Z794 Long term (current) use of insulin: Secondary | ICD-10-CM

## 2021-08-15 LAB — GLUCOSE, POCT (MANUAL RESULT ENTRY): POC Glucose: 161 mg/dl — AB (ref 70–99)

## 2021-08-15 LAB — POCT URINALYSIS DIP (CLINITEK)
Bilirubin, UA: NEGATIVE
Glucose, UA: NEGATIVE mg/dL
Ketones, POC UA: NEGATIVE mg/dL
Nitrite, UA: POSITIVE — AB
Spec Grav, UA: 1.02 (ref 1.010–1.025)
Urobilinogen, UA: 0.2 E.U./dL
pH, UA: 6 (ref 5.0–8.0)

## 2021-08-15 MED ORDER — CEFTRIAXONE SODIUM 500 MG IJ SOLR
500.0000 mg | Freq: Once | INTRAMUSCULAR | Status: AC
  Administered 2021-08-15: 500 mg via INTRAMUSCULAR

## 2021-08-15 MED ORDER — DOXYCYCLINE HYCLATE 100 MG PO TABS
100.0000 mg | ORAL_TABLET | Freq: Two times a day (BID) | ORAL | 0 refills | Status: DC
  Filled 2021-08-15: qty 30, 15d supply, fill #0

## 2021-08-15 NOTE — Patient Instructions (Signed)
Urinary Tract Infection, Adult °A urinary tract infection (UTI) is an infection of any part of the urinary tract. The urinary tract includes: °The kidneys. °The ureters. °The bladder. °The urethra. °These organs make, store, and get rid of pee (urine) in the body. °What are the causes? °This infection is caused by germs (bacteria) in your genital area. These germs grow and cause swelling (inflammation) of your urinary tract. °What increases the risk? °The following factors may make you more likely to develop this condition: °Using a small, thin tube (catheter) to drain pee. °Not being able to control when you pee or poop (incontinence). °Being male. If you are male, these things can increase the risk: °Using these methods to prevent pregnancy: °A medicine that kills sperm (spermicide). °A device that blocks sperm (diaphragm). °Having low levels of a male hormone (estrogen). °Being pregnant. °You are more likely to develop this condition if: °You have genes that add to your risk. °You are sexually active. °You take antibiotic medicines. °You have trouble peeing because of: °A prostate that is bigger than normal, if you are male. °A blockage in the part of your body that drains pee from the bladder. °A kidney stone. °A nerve condition that affects your bladder. °Not getting enough to drink. °Not peeing often enough. °You have other conditions, such as: °Diabetes. °A weak disease-fighting system (immune system). °Sickle cell disease. °Gout. °Injury of the spine. °What are the signs or symptoms? °Symptoms of this condition include: °Needing to pee right away. °Peeing small amounts often. °Pain or burning when peeing. °Blood in the pee. °Pee that smells bad or not like normal. °Trouble peeing. °Pee that is cloudy. °Fluid coming from the vagina, if you are male. °Pain in the belly or lower back. °Other symptoms include: °Vomiting. °Not feeling hungry. °Feeling mixed up (confused). This may be the first symptom in  older adults. °Being tired and grouchy (irritable). °A fever. °Watery poop (diarrhea). °How is this treated? °Taking antibiotic medicine. °Taking other medicines. °Drinking enough water. °In some cases, you may need to see a specialist. °Follow these instructions at home: °Medicines °Take over-the-counter and prescription medicines only as told by your doctor. °If you were prescribed an antibiotic medicine, take it as told by your doctor. Do not stop taking it even if you start to feel better. °General instructions °Make sure you: °Pee until your bladder is empty. °Do not hold pee for a long time. °Empty your bladder after sex. °Wipe from front to back after peeing or pooping if you are a male. Use each tissue one time when you wipe. °Drink enough fluid to keep your pee pale yellow. °Keep all follow-up visits. °Contact a doctor if: °You do not get better after 1-2 days. °Your symptoms go away and then come back. °Get help right away if: °You have very bad back pain. °You have very bad pain in your lower belly. °You have a fever. °You have chills. °You feeling like you will vomit or you vomit. °Summary °A urinary tract infection (UTI) is an infection of any part of the urinary tract. °This condition is caused by germs in your genital area. °There are many risk factors for a UTI. °Treatment includes antibiotic medicines. °Drink enough fluid to keep your pee pale yellow. °This information is not intended to replace advice given to you by your health care provider. Make sure you discuss any questions you have with your health care provider. °Document Revised: 03/09/2020 Document Reviewed: 03/09/2020 °Elsevier Patient Education © 2022   Elsevier Inc. ° °

## 2021-08-16 ENCOUNTER — Other Ambulatory Visit: Payer: Self-pay | Admitting: Physician Assistant

## 2021-08-16 LAB — URINE CYTOLOGY ANCILLARY ONLY
Chlamydia: NEGATIVE
Comment: NEGATIVE
Comment: NEGATIVE
Comment: NORMAL
Neisseria Gonorrhea: NEGATIVE
Trichomonas: NEGATIVE

## 2021-08-16 NOTE — Progress Notes (Signed)
Taylor Hughes, is a 51 y.o. male  [phone]  HER:[id number]  DOB - Aug 10, 1971  Chief Complaint  Patient presents with   Back Pain       Subjective:   Taylor Hughes is a 51 y.o. male here today for dark and cloudy urine for several weeks and now having R flank pain.  Denies penile discharge..  denies STD risk.  Some mild intermittent dysuria. He denies abdominal pain/N/V/D.  No fever.  He says he does not feel otherwise sick.  Pain in R flank is intermittent and dull.  Patient has No headache, No chest pain, No abdominal pain - No Nausea, No new weakness tingling or numbness, No Cough - SOB.  No problems updated.  ALLERGIES: No Known Allergies  PAST MEDICAL HISTORY: Past Medical History:  Diagnosis Date   Atrial fibrillation ([place])    Diabetes mellitus without complication (HCC)    Type 2   GERD (gastroesophageal reflux disease)    Wears glasses    Wound, open, foot    left diabetic     MEDICATIONS AT HOME: Prior to Admission medications   Medication Sig Start Date End Date Taking? Authorizing Provider  acetaminophen (TYLENOL) 325 MG tablet Take 650 mg by mouth every 6 (six) hours as needed for mild pain, fever or headache.   Yes [provider]  albuterol (VENTOLIN HFA) 108 (90 Base) MCG/ACT inhaler Inhale 2 puffs into the lungs every 6 (six) hours as needed for wheezing or shortness of breath. 06/18/21  Yes Newlin, Charlane Ferretti, MD  atorvastatin (LIPITOR) 20 MG tablet TAKE 1 TABLET (20 MG TOTAL) BY MOUTH DAILY. 09/26/20 09/26/21 Yes Charlott Rakes, MD  Blood Glucose Monitoring Suppl (TRUE METRIX METER) w/Device KIT Use to check blood sugar three times daily. 04/25/21  Yes Charlott Rakes, MD  doxycycline (VIBRA-TABS) 100 MG tablet Take 1 tablet (100 mg total) by mouth 2 (two) times daily. 08/15/21  Yes Argentina Donovan, PA-C  furosemide (LASIX) 20 MG tablet Take 1 tablet (20 mg total) by mouth daily. 06/13/21  Yes Newlin, Charlane Ferretti, MD  gabapentin (NEURONTIN) 300 MG  capsule TAKE 1 CAPSULE (300 MG TOTAL) BY MOUTH 2 (TWO) TIMES DAILY. Patient taking differently: Take 300 mg by mouth 2 (two) times daily. 09/26/20 09/26/21 Yes Charlott Rakes, MD  glucose blood (TRUE METRIX BLOOD GLUCOSE TEST) test strip Use as directed 07/29/21  Yes Newlin, Enobong, MD  insulin aspart (NOVOLOG FLEXPEN) 100 UNIT/ML FlexPen Inject 0-12 units into the skin three times daily with meals. Per sliding scale. 09/26/20 10/25/21 Yes Newlin, Enobong, MD  insulin detemir (LEVEMIR) 100 UNIT/ML FlexPen Inject 15 Units into the skin 2 (two) times daily. 06/13/21 06/13/22 Yes Charlott Rakes, MD  Insulin Pen Needle 32G X 5 MM MISC Use with levemir pen as prescribed 11/29/18  Yes [place] Eric J, DO  lisinopril-hydrochlorothiazide (ZESTORETIC) 10-12.5 MG tablet TAKE 1 TABLET BY MOUTH DAILY. 06/10/21  Yes Dwyane Dee, MD  metoprolol tartrate (LOPRESSOR) 50 MG tablet TAKE 1 TABLET (50 MG TOTAL) BY MOUTH 2 (TWO) TIMES DAILY. 06/10/21 06/10/22 Yes Dwyane Dee, MD  TRUEplus Lancets 28G MISC Use to check blood sugar daily. 01/02/21  Yes Newlin, Enobong, MD    ROS: Neg HEENT Neg resp Neg cardiac Neg MS Neg psych Neg neuro  Objective:   Vitals:   08/15/21 1401  BP: 131/85  Pulse: 70  SpO2: 99%  Weight: (!) 324 lb 4 oz (147.1 kg)  Height: _0  (1.803 m)   Exam General appearance : Awake,  alert, not in any distress. Speech Clear. Not toxic looking HEENT: Atraumatic and Normocephalic Neck: Supple, no JVD. No cervical lymphadenopathy.  Chest: Good air entry bilaterally, CTAB.  No rales/rhonchi/wheezing CVS: S1 S2 regular, no murmurs.  Abdomen: Bowel sounds present, Non tender and not distended with no gaurding, rigidity or rebound. No CVA TTP Extremities: B/L Lower Ext shows no edema, both legs are warm to touch Neurology: Awake alert, and oriented X 3, CN II-XII intact, Non focal Skin: No Rash  Data Review Lab Results  Component Value Date   HGBA1C 9.4 (H) 06/06/2021   HGBA1C 12.7  (H) 05/27/2019   HGBA1C 11.4 (H) 11/25/2018    Assessment & Plan   1. Cloudy urine Will cover for pyelo/prostatitis/G/C due to dip results - Glucose (CBG) - POCT URINALYSIS DIP (CLINITEK) - Urine cytology ancillary only - Urine Culture  2. Type 2 diabetes mellitus with other specified complication, with long-term current use of insulin (HCC) Not controlled but he says his blood sugars are under 200 at home and "improving" Continue current regimen and work on diabetic diet - Glucose (CBG) - POCT URINALYSIS DIP (CLINITEK)  3. Pyuria See #1 - doxycycline (VIBRA-TABS) 100 MG tablet; Take 1 tablet (100 mg total) by mouth 2 (two) times daily.  Dispense: 30 tablet; Refill: 0 - cefTRIAXone (ROCEPHIN) injection 500 mg  4. Encounter for screening fecal occult blood testing - Fecal occult blood, imunochemical    Patient have been counseled extensively about nutrition and exercise. Other issues discussed during this visit include: low cholesterol diet, weight control and daily exercise, foot care, annual eye examinations at Ophthalmology, importance of adherence with medications and regular follow-up. We also discussed long term complications of uncontrolled diabetes and hypertension.   Return in about 6 weeks (around 09/26/2021) for PCP for chronic conditions.  The patient was given clear instructions to go to ER or return to medical center if symptoms don't improve, worsen or new problems develop. The patient verbalized understanding. The patient was told to call to get lab results if they haven't heard anything in the next week.      Freeman Caldron, PA-C [facility] and [facility], [place]   08/16/2021, 12:55 PM Patient ID: Taylor Hughes, male   DOB: April 23, 1971, 51 y.o.   MRN: [id number]

## 2021-08-20 ENCOUNTER — Ambulatory Visit: Payer: Self-pay | Admitting: *Deleted

## 2021-08-20 NOTE — Telephone Encounter (Signed)
I returned his call.  He had called in earlier c/o having diarrhea while taking doxycycline.   Reason for Disposition  [1] MILD diarrhea AND [2] taking antibiotics  Answer Assessment - Initial Assessment Questions 1. DIARRHEA SEVERITY: "How bad is the diarrhea?" "How many more stools have you had in the past 24 hours than normal?"    - NO DIARRHEA (SCALE 0)   - MILD (SCALE 1-3): Few loose or mushy BMs; increase of 1-3 stools over normal daily number of stools; mild increase in ostomy output.   -  MODERATE (SCALE 4-7): Increase of 4-6 stools daily over normal; moderate increase in ostomy output. * SEVERE (SCALE 8-10; OR 'WORST POSSIBLE'): Increase of 7 or more stools daily over normal; moderate increase in ostomy output; incontinence.     I returned call.   He is on doxycycline and is having diarrhea.   I've woken up twice with incontinence in my bed of stool.      2. ONSET: "When did the diarrhea begin?"      Started last [place].  After 2 doses of the doxycycline.     The antibiotics are working as far as my urine symptoms go my urine no longer smells bad. 3. BM CONSISTENCY: "How loose or watery is the diarrhea?"      Watery diarrhea. 4. VOMITING: "Are you also vomiting?" If Yes, ask: "How many times in the past 24 hours?"      No 5. ABDOMINAL PAIN: "Are you having any abdominal pain?" If Yes, ask: "What does it feel like?" (e.g., crampy, dull, intermittent, constant)      *No Answer* 6. ABDOMINAL PAIN SEVERITY: If present, ask: "How bad is the pain?"  (e.g., Scale 1-10; mild, moderate, or severe)   - MILD (1-3): doesn't interfere with normal activities, abdomen soft and not tender to touch    - MODERATE (4-7): interferes with normal activities or awakens from sleep, abdomen tender to touch    - SEVERE (8-10): excruciating pain, doubled over, unable to do any normal activities       *No Answer* 7. ORAL INTAKE: If vomiting, "Have you been able to drink liquids?" "How much liquids have you  had in the past 24 hours?"     *No Answer* 8. HYDRATION: "Any signs of dehydration?" (e.g., dry mouth [not just dry lips], too weak to stand, dizziness, new weight loss) "When did you last urinate?"     *No Answer* 9. EXPOSURE: "Have you traveled to a foreign country recently?" "Have you been exposed to anyone with diarrhea?" "Could you have eaten any food that was spoiled?"     *No Answer* 10. ANTIBIOTIC USE: "Are you taking antibiotics now or have you taken antibiotics in the past 2 months?"       Yes on doxycycline for a UTI 11. OTHER SYMPTOMS: "Do you have any other symptoms?" (e.g., fever, blood in stool)       *No Answer* 12. PREGNANCY: "Is there any chance you are pregnant?" "When was your last menstrual period?"       *No Answer*  Protocols used: [facility]

## 2021-08-20 NOTE — Telephone Encounter (Signed)
His urine culture is still pending and that will provide culture and sensitivity and determine if a new antibiotic needs to be ordered.  Please forward message to Levada Dy who ordered urine culture and will be receiving results so she can guide patient appropriately.  She can hold off on doxycycline.

## 2021-08-20 NOTE — Telephone Encounter (Signed)
°  Chief Complaint: diarrhea from doxycycline for UTI Symptoms: diarrhea, twice been incontinent in bed of stool Frequency: daily but worse at night Pertinent Negatives: Patient denies having bad smelling urine now.   The urinary symptoms are improving. Disposition: [] ED /[] Urgent Care (no appt availability in office) / [] Appointment(In office/virtual)/ []  [place] [facility]/ [] Home Care/ [] Refused Recommended Disposition /[] [place] [facility]/ [x]  Follow-up with PCP Additional Notes: Is there another antibiotic he can take that doesn't give him diarrhea.  Please advise pt.

## 2021-08-21 LAB — URINE CULTURE

## 2021-08-21 NOTE — Telephone Encounter (Signed)
Left message on voicemail to return call.

## 2021-08-22 NOTE — Telephone Encounter (Unsigned)
Copied from [place] [id number]. Topic: Quick Communication - Rx Refill/Question >> Aug 22, 2021 12:11 PM Pawlus, Brayton Layman A wrote: Pt stated he did not receive any voicemail regarding having his medication changed, pt spoke with NT already but wanted a call back to see if a different antibiotic can be called in, please advise.

## 2021-08-22 NOTE — Telephone Encounter (Unsigned)
Left messages on patient voicemail per Freeman Caldron, PA-C.  He does need to continue/complete the doxycycline if he can tolerate it.  If he feels the loose stool is severe, I will change him to something else.  If it is mild/tolerable, he should complete the doxycycline.  Let me know, thanks! Levada Dy

## 2021-08-22 NOTE — Telephone Encounter (Signed)
Left message on voicemail to return call.   Left message on voicemail per DPR. Encourage to call if they have additional questions or concerns.

## 2021-08-28 ENCOUNTER — Other Ambulatory Visit: Payer: Self-pay

## 2021-08-28 MED FILL — Gabapentin Cap 300 MG: ORAL | 30 days supply | Qty: 60 | Fill #0 | Status: AC

## 2021-08-29 ENCOUNTER — Other Ambulatory Visit: Payer: Self-pay

## 2021-09-03 ENCOUNTER — Other Ambulatory Visit: Payer: Self-pay

## 2021-09-05 ENCOUNTER — Other Ambulatory Visit: Payer: Self-pay

## 2021-09-05 ENCOUNTER — Other Ambulatory Visit: Payer: Self-pay | Admitting: Family Medicine

## 2021-09-05 NOTE — Telephone Encounter (Signed)
Requested medications are due for refill today.  yes  Requested medications are on the active medications list.  yes  Last refill. 09/26/2020  Future visit scheduled.   yes  Notes to clinic.  Failed protocol d/t expired or missing labs.    Requested Prescriptions  Pending Prescriptions Disp Refills   atorvastatin (LIPITOR) 20 MG tablet 30 tablet 6    Sig: TAKE 1 TABLET (20 MG TOTAL) BY MOUTH DAILY.     Cardiovascular:  Antilipid - Statins Failed - 09/05/2021 12:35 PM      Failed - Total Cholesterol in normal range and within 360 days    No results found for: CHOL, POCCHOL, CHOLTOT        Failed - LDL in normal range and within 360 days    No results found for: LDLCALC, LDLC, HIRISKLDL, POCLDL, LDLDIRECT, REALLDLC, TOTLDLC        Failed - HDL in normal range and within 360 days    No results found for: HDL, POCHDL        Failed - Triglycerides in normal range and within 360 days    No results found for: TRIG, POCTRIG        Passed - Patient is not pregnant      Passed - Valid encounter within last 12 months    Recent Outpatient Visits           3 weeks ago Cloudy urine   [facility] [place], [place], [place]   2 months ago Type 2 diabetes mellitus with other specified complication, with long-term current use of insulin ([place])   [place], Enobong, MD   11 months ago Essential hypertension   [facility] [place], Charlane Ferretti, MD   2 years ago Pedal edema   [place], Charlane Ferretti, MD   2 years ago Type 2 diabetes mellitus with other specified complication, with long-term current use of insulin ([place])   [place], Enobong, MD       Future Appointments             In 1 month Charlott Rakes, MD [place]

## 2021-09-06 ENCOUNTER — Other Ambulatory Visit: Payer: Self-pay

## 2021-09-06 MED ORDER — ATORVASTATIN CALCIUM 20 MG PO TABS
ORAL_TABLET | Freq: Every day | ORAL | 0 refills | Status: DC
  Filled 2021-09-06: qty 30, 30d supply, fill #0

## 2021-09-09 ENCOUNTER — Other Ambulatory Visit: Payer: Self-pay

## 2021-09-10 ENCOUNTER — Other Ambulatory Visit: Payer: Self-pay

## 2021-09-12 ENCOUNTER — Other Ambulatory Visit: Payer: Self-pay

## 2021-09-19 ENCOUNTER — Ambulatory Visit (INDEPENDENT_AMBULATORY_CARE_PROVIDER_SITE_OTHER): Payer: Self-pay | Admitting: Family Medicine

## 2021-09-19 ENCOUNTER — Other Ambulatory Visit: Payer: Self-pay

## 2021-09-19 ENCOUNTER — Encounter: Payer: Self-pay | Admitting: Family Medicine

## 2021-09-19 VITALS — BP 151/94 | HR 74 | Temp 98.5°F | Resp 18 | Wt 333.8 lb

## 2021-09-19 DIAGNOSIS — E119 Type 2 diabetes mellitus without complications: Secondary | ICD-10-CM | POA: Insufficient documentation

## 2021-09-19 DIAGNOSIS — Z6841 Body Mass Index (BMI) 40.0 and over, adult: Secondary | ICD-10-CM

## 2021-09-19 DIAGNOSIS — L0211 Cutaneous abscess of neck: Secondary | ICD-10-CM | POA: Insufficient documentation

## 2021-09-19 DIAGNOSIS — E1169 Type 2 diabetes mellitus with other specified complication: Secondary | ICD-10-CM

## 2021-09-19 DIAGNOSIS — I10 Essential (primary) hypertension: Secondary | ICD-10-CM

## 2021-09-19 DIAGNOSIS — M79604 Pain in right leg: Secondary | ICD-10-CM

## 2021-09-19 LAB — POCT GLYCOSYLATED HEMOGLOBIN (HGB A1C): Hemoglobin A1C: 8.2 % — AB (ref 4.0–5.6)

## 2021-09-19 MED ORDER — LISINOPRIL-HYDROCHLOROTHIAZIDE 20-25 MG PO TABS: 1.0000 | ORAL_TABLET | Freq: Every day | ORAL | 1 refills | Status: DC

## 2021-09-19 MED ORDER — TRAMADOL HCL 50 MG PO TABS: 50.0000 mg | ORAL_TABLET | Freq: Three times a day (TID) | ORAL | 0 refills | Status: AC | PRN

## 2021-09-19 NOTE — Progress Notes (Signed)
Patient is having leg pain to where is is hard to at times. Patient said this has been going on for 3 wks.Patient said that his calf is red ,tender,swelling and warm to touch. Pain is 10/10

## 2021-09-20 ENCOUNTER — Telehealth: Payer: Self-pay | Admitting: *Deleted

## 2021-09-20 ENCOUNTER — Encounter: Payer: Self-pay | Admitting: Family Medicine

## 2021-09-20 NOTE — Telephone Encounter (Signed)
Prior -Taylor Hughes has been sent to Friday for Venous Doppler of right leg. Patient is aware

## 2021-09-20 NOTE — Progress Notes (Addendum)
Established Patient Office Visit  Subjective:  Patient ID: Taylor Hughes, male    DOB: 1970-12-26  Age: 51 y.o. MRN: [id number]  CC:  Chief Complaint  Patient presents with   Leg Swelling    HPI Taylor Hughes presents for pain and swelling in right lower leg. Patient reports that this has been occurring over some days and worsening. Denies known trauma or injury.   Past Medical History:  Diagnosis Date   Atrial fibrillation ([place])    Diabetes mellitus without complication ([place])    Type 2   GERD (gastroesophageal reflux disease)    Wears glasses    Wound, open, foot    left diabetic     Past Surgical History:  Procedure Laterality Date   AMPUTATION Left 08/20/2018   Procedure: LEFT FOOT IRRIGATON AND DEBRIDEMENT, 5TH RAY AMPUTATION;  Surgeon: Newt Minion, MD;  Location: [facility];  Service: Orthopedics;  Laterality: Left;   APPLICATION OF A-CELL OF CHEST/ABDOMEN N/A 05/30/2019   Procedure: Application Of A-Cell Of Chest;  Surgeon: Lajuana Matte, MD;  Location: [facility];  Service: Cardiothoracic;  Laterality: N/A;   APPLICATION OF WOUND VAC N/A 05/30/2019   Procedure: Application Of Wound Vac;  Surgeon: Lajuana Matte, MD;  Location: [facility];  Service: Cardiothoracic;  Laterality: N/A;   FINGER SURGERY Right 2016   I&D  small finger   I & D EXTREMITY Left 07/30/2018   Procedure: IRRIGATION AND DEBRIDEMENT LEFT FOOT WITH POSSIBLE AMPUTATION OF FIFTH TOE;  Surgeon: Mcarthur Rossetti, MD;  Location: [facility];  Service: Orthopedics;  Laterality: Left;   INCISION AND DRAINAGE ABSCESS Left 01/19/2019   Procedure: INCISION AND DRAINAGE ABSCESS upper chest;  Surgeon: Melida Quitter, MD;  Location: [facility];  Service: ENT;  Laterality: Left;   IRRIGATION AND DEBRIDEMENT STERNOCLAVICULAR JOINT-STERNUM AND RIBS N/A 05/30/2019   Procedure: IRRIGATION AND DEBRIDEMENT OF STERNOCLAVICULAR JOINT-STERNUM AND RIBS ;  Surgeon: Lajuana Matte, MD;  Location: [facility];  Service:  Cardiothoracic;  Laterality: N/A;   MINOR IRRIGATION AND DEBRIDEMENT OF WOUND N/A 11/22/2018   Procedure: INCISION AND DRAINAGE OF NECK ABSCESS;  Surgeon: Melida Quitter, MD;  Location[facility];  Service: ENT;  Laterality: N/A;    Family History  Problem Relation Age of Onset   Diabetes Mother     Social History   Socioeconomic History   Marital status: Divorced    Spouse name: Not on file   Number of children: Not on file   Years of education: Not on file   Highest education level: Not on file  Occupational History   Not on file  Tobacco Use   Smoking status: Never   Smokeless tobacco: Never  Vaping Use   Vaping Use: Never used  Substance and Sexual Activity   Alcohol use: No    Alcohol/week: 0.0 standard drinks   Drug use: No   Sexual activity: Not on file  Other Topics Concern   Not on file  Social History Narrative   Not on file   Social Determinants of Health   Financial Resource Strain: Not on file  Food Insecurity: Not on file  Transportation Needs: Not on file  Physical Activity: Not on file  Stress: Not on file  Social Connections: Not on file  Intimate Partner Violence: Not on file    ROS Review of Systems  Respiratory: Negative.    Cardiovascular:  Positive for leg swelling. Negative for chest pain.  All other systems reviewed and are negative.  Objective:   Today's Vitals: BP (!) 151/94    Pulse 74    Temp 98.5 F (36.9 C) (Oral)    Resp 18    Wt (!) 333 lb 12.8 oz (151.4 kg)    SpO2 98%    BMI 46.56 kg/m   Physical Exam Vitals and nursing note reviewed.  Constitutional:      General: He is not in acute distress.    Appearance: He is obese.  Cardiovascular:     Rate and Rhythm: Normal rate and regular rhythm.  Pulmonary:     Effort: Pulmonary effort is normal.     Breath sounds: Normal breath sounds.  Abdominal:     Palpations: Abdomen is soft.     Tenderness: There is no abdominal tenderness.  Musculoskeletal:     Right lower leg:  Swelling and tenderness present. No deformity. Edema present.     Left lower leg: No swelling or tenderness. Edema present.  Neurological:     General: No focal deficit present.     Mental Status: He is alert and oriented to person, place, and time.    Assessment & Plan:   1. Type 2 diabetes mellitus with other specified complication, without long-term current use of insulin (HCC) Elevated A1c. Discussed in detail. Will continue present management and monitor.  - POCT glycosylated hemoglobin (Hb A1C)  2. Right leg pain Referral for venous doppler and to vascular for further eval/mgt - Ambulatory referral to Vascular Surgery - VAS [place] LOWER EXTREMITY VENOUS (DVT); Future  3. Essential hypertension Elevated reading. Increased med and monitor.   4. Class 3 severe obesity due to excess calories with serious comorbidity and body mass index (BMI) of 45.0 to 49.9 in adult [facility]) Discussed dietary and activity options. Goal is 4-6lbs/mo wt loss. monitor    Outpatient Encounter Medications as of 09/19/2021  Medication Sig   acetaminophen (TYLENOL) 325 MG tablet Take 650 mg by mouth every 6 (six) hours as needed for mild pain, fever or headache.   albuterol (VENTOLIN HFA) 108 (90 Base) MCG/ACT inhaler Inhale 2 puffs into the lungs every 6 (six) hours as needed for wheezing or shortness of breath.   atorvastatin (LIPITOR) 20 MG tablet TAKE 1 TABLET (20 MG TOTAL) BY MOUTH DAILY.   Blood Glucose Monitoring Suppl (TRUE METRIX METER) w/Device KIT Use to check blood sugar three times daily.   doxycycline (VIBRA-TABS) 100 MG tablet Take 1 tablet (100 mg total) by mouth 2 (two) times daily.   furosemide (LASIX) 20 MG tablet Take 1 tablet (20 mg total) by mouth daily.   gabapentin (NEURONTIN) 300 MG capsule TAKE 1 CAPSULE (300 MG TOTAL) BY MOUTH 2 (TWO) TIMES DAILY. (Patient taking differently: Take 300 mg by mouth 2 (two) times daily.)   glucose blood (TRUE METRIX BLOOD GLUCOSE TEST) test strip Use as  directed   insulin aspart (NOVOLOG FLEXPEN) 100 UNIT/ML FlexPen Inject 0-12 units into the skin three times daily with meals. Per sliding scale.   insulin detemir (LEVEMIR) 100 UNIT/ML FlexPen Inject 15 Units into the skin 2 (two) times daily.   Insulin Pen Needle 32G X 5 MM MISC Use with levemir pen as prescribed   lisinopril-hydrochlorothiazide (ZESTORETIC) 10-12.5 MG tablet TAKE 1 TABLET BY MOUTH DAILY.   lisinopril-hydrochlorothiazide (ZESTORETIC) 20-25 MG tablet Take 1 tablet by mouth daily.   metoprolol tartrate (LOPRESSOR) 50 MG tablet TAKE 1 TABLET (50 MG TOTAL) BY MOUTH 2 (TWO) TIMES DAILY.   traMADol (ULTRAM) 50 MG tablet Take  1 tablet (50 mg total) by mouth every 8 (eight) hours as needed for up to 5 days.   TRUEplus Lancets 28G MISC Use to check blood sugar daily.   No facility-administered encounter medications on file as of 09/19/2021.    Follow-up: No follow-ups on file.   Becky Sax, MD

## 2021-09-30 ENCOUNTER — Other Ambulatory Visit: Payer: Self-pay

## 2021-10-03 ENCOUNTER — Other Ambulatory Visit: Payer: Self-pay

## 2021-10-03 ENCOUNTER — Ambulatory Visit (HOSPITAL_COMMUNITY)
Admission: RE | Admit: 2021-10-03 | Discharge: 2021-10-03 | Disposition: A | Discharge status: Home / Self Care | Payer: 59 | Source: Ambulatory Visit | Attending: Family Medicine | Admitting: Family Medicine

## 2021-10-03 ENCOUNTER — Encounter (HOSPITAL_COMMUNITY): Payer: Self-pay

## 2021-10-03 DIAGNOSIS — M79604 Pain in right leg: Secondary | ICD-10-CM | POA: Diagnosis present

## 2021-10-03 NOTE — Progress Notes (Signed)
Lower extremity venous has been completed.   Preliminary results in CV Proc.   Jinny Blossom Ioannis Schuh 10/03/2021 8:43 AM

## 2021-10-03 NOTE — Progress Notes (Signed)
Office Note     CC: Right lower extremity swelling Requesting Provider:  Charlott Rakes, MD  HPI: Taylor Hughes is a 51 y.o. (1971/07/24) male presenting at the request of .Charlott Rakes, MD with a lifelong history of right lower extremity swelling.  Since his long as complicated member his right leg is been larger than his left leg.  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  By the end of the day his right calf and foot are to the touch and painful.  He was concerned for deep venous thrombosis.  On exam today, Taylor Hughes was doing well.  The size of his right calf is waxed and waned over the years, but he has noticed it remains more firm and larger than previous.  His mom had a history of asymmetric lower extremity edema.  She was never diagnosed with lymphedema, but was diagnosed with phlebitis.  He denies claudication, ischemic rest pain, tissue loss.  He denies history of infection or trauma in the right leg prior to symptom onset.  The pt is  on a statin for cholesterol management.  The pt is  on a daily aspirin.   Other AC:  - The pt is  on medication for hypertension.   The pt is  diabetic.  Tobacco hx:  -  Past Medical History:  Diagnosis Date   Atrial fibrillation ([place])    Diabetes mellitus without complication ([place])    Type 2   GERD (gastroesophageal reflux disease)    Wears glasses    Wound, open, foot    left diabetic     Past Surgical History:  Procedure Laterality Date   AMPUTATION Left 08/20/2018   Procedure: LEFT FOOT IRRIGATON AND DEBRIDEMENT, 5TH RAY AMPUTATION;  Surgeon: Newt Minion, MD;  Location: [place] [place];  Service: Orthopedics;  Laterality: Left;   APPLICATION OF A-CELL OF CHEST/ABDOMEN N/A 05/30/2019   Procedure: Application Of A-Cell Of Chest;  Surgeon: Lajuana Matte, MD;  Location: [place] [place];  Service: Cardiothoracic;  Laterality: N/A;   APPLICATION OF WOUND VAC N/A 05/30/2019   Procedure: Application Of Wound  Vac;  Surgeon: Lajuana Matte, MD;  Location: [facility];  Service: Cardiothoracic;  Laterality: N/A;   FINGER SURGERY Right 2016   I&D  small finger   I & D EXTREMITY Left 07/30/2018   Procedure: IRRIGATION AND DEBRIDEMENT LEFT FOOT WITH POSSIBLE AMPUTATION OF FIFTH TOE;  Surgeon: Mcarthur Rossetti, MD;  [facility];  Service: Orthopedics;  Laterality: Left;   INCISION AND DRAINAGE ABSCESS Left 01/19/2019   Procedure: INCISION AND DRAINAGE ABSCESS upper chest;  Surgeon: Melida Quitter, MD;  Location: [facility];  Service: ENT;  Laterality: Left;   IRRIGATION AND DEBRIDEMENT STERNOCLAVICULAR JOINT-STERNUM AND RIBS N/A 05/30/2019   Procedure: IRRIGATION AND DEBRIDEMENT OF STERNOCLAVICULAR JOINT-STERNUM AND RIBS ;  Surgeon: Lajuana Matte, MD;  Location: [facility];  Service: Cardiothoracic;  Laterality: N/A;   MINOR IRRIGATION AND DEBRIDEMENT OF WOUND N/A 11/22/2018   Procedure: INCISION AND DRAINAGE OF NECK ABSCESS;  Surgeon: Melida Quitter, MD;  [facility];  Service: ENT;  Laterality: N/A;    Social History   Socioeconomic History   Marital status: Divorced    Spouse name: Not on file   Number of children: Not on file   Years of education: Not on file   Highest education level: Not on file  Occupational History   Not on  file  Tobacco Use   Smoking status: Never   Smokeless tobacco: Never  Vaping Use   Vaping Use: Never used  Substance and Sexual Activity   Alcohol use: No    Alcohol/week: 0.0 standard drinks   Drug use: No   Sexual activity: Not on file  Other Topics Concern   Not on file  Social History Narrative   Not on file   Social Determinants of Health   Financial Resource Strain: Not on file  Food Insecurity: Not on file  Transportation Needs: Not on file  Physical Activity: Not on file  Stress: Not on file  Social Connections: Not on file  Intimate Partner Violence: Not on file    Family History  Problem Relation Age of Onset   Diabetes Mother      Current Outpatient Medications  Medication Sig Dispense Refill   acetaminophen (TYLENOL) 325 MG tablet Take 650 mg by mouth every 6 (six) hours as needed for mild pain, fever or headache.     albuterol (VENTOLIN HFA) 108 (90 Base) MCG/ACT inhaler Inhale 2 puffs into the lungs every 6 (six) hours as needed for wheezing or shortness of breath. 18 g 0   atorvastatin (LIPITOR) 20 MG tablet TAKE 1 TABLET (20 MG TOTAL) BY MOUTH DAILY. 30 tablet 0   Blood Glucose Monitoring Suppl (TRUE METRIX METER) w/Device KIT Use to check blood sugar three times daily. 1 kit 0   doxycycline (VIBRA-TABS) 100 MG tablet Take 1 tablet (100 mg total) by mouth 2 (two) times daily. 30 tablet 0   furosemide (LASIX) 20 MG tablet Take 1 tablet (20 mg total) by mouth daily. 30 tablet 3   gabapentin (NEURONTIN) 300 MG capsule TAKE 1 CAPSULE (300 MG TOTAL) BY MOUTH 2 (TWO) TIMES DAILY. (Patient taking differently: Take 300 mg by mouth 2 (two) times daily.) 60 capsule 6   glucose blood (TRUE METRIX BLOOD GLUCOSE TEST) test strip Use as directed 100 strip 2   insulin aspart (NOVOLOG FLEXPEN) 100 UNIT/ML FlexPen Inject 0-12 units into the skin three times daily with meals. Per sliding scale. 30 mL 6   insulin detemir (LEVEMIR) 100 UNIT/ML FlexPen Inject 15 Units into the skin 2 (two) times daily. 30 mL 6   Insulin Pen Needle 32G X 5 MM MISC Use with levemir pen as prescribed 100 each 0   lisinopril-hydrochlorothiazide (ZESTORETIC) 10-12.5 MG tablet TAKE 1 TABLET BY MOUTH DAILY. 30 tablet 6   lisinopril-hydrochlorothiazide (ZESTORETIC) 20-25 MG tablet Take 1 tablet by mouth daily. 90 tablet 1   metoprolol tartrate (LOPRESSOR) 50 MG tablet TAKE 1 TABLET (50 MG TOTAL) BY MOUTH 2 (TWO) TIMES DAILY. 60 tablet 6   TRUEplus Lancets 28G MISC Use to check blood sugar daily. 100 each 11   No current facility-administered medications for this visit.    No Known Allergies   REVIEW OF SYSTEMS:   [X]  denotes positive finding, [ ]   denotes negative finding Cardiac  Comments:  Chest pain or chest pressure:    Shortness of breath upon exertion:    Short of breath when lying flat:    Irregular heart rhythm:        Vascular    Pain in calf, thigh, or hip brought on by ambulation:    Pain in feet at night that wakes you up from your sleep:     Blood clot in your veins:    Leg swelling:         Pulmonary  Oxygen at home:    Productive cough:     Wheezing:         Neurologic    Sudden weakness in arms or legs:     Sudden numbness in arms or legs:     Sudden onset of difficulty speaking or slurred speech:    Temporary loss of vision in one eye:     Problems with dizziness:         Gastrointestinal    Blood in stool:     Vomited blood:         Genitourinary    Burning when urinating:     Blood in urine:        Psychiatric    Major depression:         Hematologic    Bleeding problems:    Problems with blood clotting too easily:        Skin    Rashes or ulcers:        Constitutional    Fever or chills:      PHYSICAL EXAMINATION:  There were no vitals filed for this visit.  General:  WDWN in NAD; vital signs documented above Gait: Not observed HENT: WNL, normocephalic Pulmonary: normal non-labored breathing , without wheezing Cardiac: regular HR,  Abdomen: soft, NT, no masses Skin: without rashes Vascular Exam/Pulses:  Right Left  Radial 2+ (normal) 2+ (normal)  Ulnar 2+ (normal) 2+ (normal)  Femoral    Popliteal    DP 2+ (normal) 2+ (normal)  PT     Extremities: without ischemic changes, without Gangrene , without cellulitis; without open wounds;    Musculoskeletal: no muscle wasting or atrophy  Neurologic: A&O X 3;  No focal weakness or paresthesias are detected Psychiatric:  The pt has Normal affect.   Non-Invasive Vascular Imaging:   Summary:  RIGHT:  - There is no evidence of deep vein thrombosis in the lower extremity.  However, portions of this examination were  limited- see technologist  comments above.     ASSESSMENT/PLAN: Taylor Hughes is a 51 y.o. male presenting with history and exam findings consistent with primary lymphedema.  I had a long discussion with Taylor Hughes regarding primary lymphedema, as well as treatment options, and natural history.  Taylor Hughes has stage III lymphedema, and that fibrosis is already become a component of his lymphedema.  His leg will likely not decrease in size with treatment.  The purpose of treatment is to prevent further deterioration and fibrosis formation.  He does not have lipodermatosclerosis or hemosiderin deposits consistent with phlebolymphedema. He would be best with compression stockings during the day, with a referral for manual lymphatic drainage.  I would like him in 20 to 30 mmHg compression stockings, however due to his size I think he would only tolerate 15 to 20 mmHg.  We will see him in follow-up in 1 year.    Taylor John, MD [facility] [phone]

## 2021-10-04 ENCOUNTER — Other Ambulatory Visit: Payer: Self-pay | Admitting: *Deleted

## 2021-10-04 ENCOUNTER — Ambulatory Visit (INDEPENDENT_AMBULATORY_CARE_PROVIDER_SITE_OTHER): Payer: 59 | Admitting: Vascular Surgery

## 2021-10-04 ENCOUNTER — Encounter: Payer: Self-pay | Admitting: Vascular Surgery

## 2021-10-04 VITALS — BP 126/78 | HR 89 | Temp 98.1°F | Resp 20 | Ht 71.0 in | Wt 324.0 lb

## 2021-10-04 DIAGNOSIS — I890 Lymphedema, not elsewhere classified: Secondary | ICD-10-CM

## 2021-10-04 DIAGNOSIS — M7989 Other specified soft tissue disorders: Secondary | ICD-10-CM

## 2021-10-07 ENCOUNTER — Ambulatory Visit: Payer: Self-pay | Admitting: Family Medicine

## 2021-10-08 ENCOUNTER — Other Ambulatory Visit: Payer: Self-pay

## 2021-10-08 ENCOUNTER — Other Ambulatory Visit: Payer: Self-pay | Admitting: Family Medicine

## 2021-10-08 DIAGNOSIS — I890 Lymphedema, not elsewhere classified: Secondary | ICD-10-CM

## 2021-10-09 ENCOUNTER — Other Ambulatory Visit: Payer: Self-pay

## 2021-10-09 MED ORDER — FUROSEMIDE 20 MG PO TABS
20.0000 mg | ORAL_TABLET | Freq: Every day | ORAL | 0 refills | Status: DC
  Filled 2021-10-09 – 2021-10-21 (×2): qty 30, 30d supply, fill #0

## 2021-10-09 MED ORDER — ATORVASTATIN CALCIUM 20 MG PO TABS
ORAL_TABLET | Freq: Every day | ORAL | 0 refills | Status: DC
  Filled 2021-10-09 – 2021-10-21 (×2): qty 30, 30d supply, fill #0

## 2021-10-09 NOTE — Telephone Encounter (Signed)
Requested medication (s) are due for refill today: yes ? ?Requested medication (s) are on the active medication list: yes ? ?Last refill:  lasix- 06/13/21 #30 3 refills, lipitor- 09/06/21-09/06/22 #30 0 refills  ? ?Future visit scheduled: yes in 2 weeks  ? ?Notes to clinic:  per protocol, no lipid panel results noted. Do you want to give #30 supply of each Rx? ? ? ?  ?Requested Prescriptions  ?Pending Prescriptions Disp Refills  ? furosemide (LASIX) 20 MG tablet 30 tablet 3  ?  Sig: Take 1 tablet (20 mg total) by mouth daily.  ?  ? Cardiovascular:  Diuretics - Loop Failed - 10/08/2021  2:49 PM  ?  ?  Failed - Ca in normal range and within 180 days  ?  Calcium  ?Date Value Ref Range Status  ?06/10/2021 8.1 (L) 8.9 - 10.3 mg/dL Final  ? ?Calcium, Ion  ?Date Value Ref Range Status  ?12/17/2018 1.21 1.15 - 1.40 mmol/L Final  ?  ?  ?  ?  Passed - K in normal range and within 180 days  ?  Potassium  ?Date Value Ref Range Status  ?06/10/2021 4.2 3.5 - 5.1 mmol/L Final  ?  ?  ?  ?  Passed - Na in normal range and within 180 days  ?  Sodium  ?Date Value Ref Range Status  ?06/10/2021 135 135 - 145 mmol/L Final  ?  ?  ?  ?  Passed - Cr in normal range and within 180 days  ?  Creatinine, Ser  ?Date Value Ref Range Status  ?06/10/2021 0.82 0.61 - 1.24 mg/dL Final  ?  ?  ?  ?  Passed - Cl in normal range and within 180 days  ?  Chloride  ?Date Value Ref Range Status  ?06/10/2021 101 98 - 111 mmol/L Final  ?  ?  ?  ?  Passed - Mg Level in normal range and within 180 days  ?  Magnesium  ?Date Value Ref Range Status  ?06/10/2021 1.7 1.7 - 2.4 mg/dL Final  ?  Comment:  ?  Performed at [facility] [address], [place], [place] [address]  ?  ?  ?  ?  Passed - Last BP in normal range  ?  BP Readings from Last 1 Encounters:  ?10/04/21 126/78  ?  ?  ?  ?  Passed - Valid encounter within last 6 months  ?  Recent Outpatient Visits   ? ?      ? 1 month ago Cloudy urine  ? [place],  [place], [place]  ? 3 months ago Type 2 diabetes mellitus with other specified complication, with long-term current use of insulin ([place])  ? [place] Charlott Rakes, MD  ? 1 year ago Essential hypertension  ? [place], Charlane Ferretti, MD  ? 2 years ago Pedal edema  ? [facility] [place], Charlane Ferretti, MD  ? 2 years ago Type 2 diabetes mellitus with other specified complication, with long-term current use of insulin ([place])  ? [place] Charlott Rakes, MD  ? ?  ?  ?Future Appointments   ? ?        ? In 2 weeks Thereasa Solo, [facility] Bilis [place]  ? ?  ? ?  ?  ?  ? atorvastatin (LIPITOR) 20 MG  tablet 30 tablet 0  ?  Sig: TAKE 1 TABLET (20 MG TOTAL) BY MOUTH DAILY.  ?  ? Cardiovascular:  Antilipid - Statins Failed - 10/08/2021  2:49 PM  ?  ?  Failed - Lipid Panel in normal range within the last 12 months  ?  No results found for: CHOL, POCCHOL, CHOLTOT ?No results found for: LDLCALC, LDLC, HIRISKLDL, POCLDL, LDLDIRECT, REALLDLC, TOTLDLC ?No results found for: HDL, POCHDL ?No results found for: TRIG, POCTRIG ?  ?  ?  Passed - Patient is not pregnant  ?  ?  Passed - Valid encounter within last 12 months  ?  Recent Outpatient Visits   ? ?      ? 1 month ago Cloudy urine  ? [place], [place], [place]  ? 3 months ago Type 2 diabetes mellitus with other specified complication, with long-term current use of insulin ([place])  ? [place] Charlott Rakes, MD  ? 1 year ago Essential hypertension  ? [place], Charlane Ferretti, MD  ? 2 years ago Pedal edema  ? [facility] [place], Charlane Ferretti, MD  ? 2 years ago Type 2 diabetes mellitus with other specified complication, with long-term current use of insulin ([place])  ? [place]  Charlott Rakes, MD  ? ?  ?  ?Future Appointments   ? ?        ? In 2 weeks Thereasa Solo, [facility] Bilis [place]  ? ?  ? ?  ?  ?  ? ?

## 2021-10-16 ENCOUNTER — Other Ambulatory Visit: Payer: Self-pay

## 2021-10-21 ENCOUNTER — Other Ambulatory Visit: Payer: Self-pay

## 2021-10-21 ENCOUNTER — Other Ambulatory Visit: Payer: Self-pay | Admitting: Family Medicine

## 2021-10-22 NOTE — Telephone Encounter (Signed)
Requested medication (s) are due for refill today: Yes ? ?Requested medication (s) are on the active medication list: Yes ? ?Last refill:  09/26/20 ? ?Future visit scheduled: Yes ? ?Notes to clinic:  Prescription has expired. ? ? ? ?Requested Prescriptions  ?Pending Prescriptions Disp Refills  ? gabapentin (NEURONTIN) 300 MG capsule 60 capsule 6  ?  Sig: TAKE 1 CAPSULE (300 MG TOTAL) BY MOUTH 2 (TWO) TIMES DAILY.  ?  ? Neurology: Anticonvulsants - gabapentin Passed - 10/21/2021 11:11 AM  ?  ?  Passed - Cr in normal range and within 360 days  ?  Creatinine, Ser  ?Date Value Ref Range Status  ?06/10/2021 0.82 0.61 - 1.24 mg/dL Final  ?  ?  ?  ?  Passed - Completed PHQ-2 or PHQ-9 in the last 360 days  ?  ?  Passed - Valid encounter within last 12 months  ?  Recent Outpatient Visits   ? ?      ? 1 month ago Type 2 diabetes mellitus with other specified complication, without long-term current use of insulin ([place])  ? [facility], MD  ? 2 months ago Cloudy urine  ? [place], [place], [place]  ? 4 months ago Type 2 diabetes mellitus with other specified complication, with long-term current use of insulin ([place])  ? [place] Charlott Rakes, MD  ? 1 year ago Essential hypertension  ? [place], Charlane Ferretti, MD  ? 2 years ago Pedal edema  ? [place] Charlott Rakes, MD  ? ?  ?  ?Future Appointments   ? ?        ? In 2 days Thereasa Solo, Cas[place] Bilis [place]  ? ?  ? ?  ?  ?  ? ?

## 2021-10-22 NOTE — Telephone Encounter (Signed)
Requested medication (s) are due for refill today - expired Rx ? ?Requested medication (s) are on the active medication list -yes ? ?Future visit scheduled -yes ? ?Last refill: 09/22/20 45m 6 RF ? ?Notes to clinic: Request RF: expired Rx ? ?Requested Prescriptions  ?Pending Prescriptions Disp Refills  ? insulin aspart (NOVOLOG FLEXPEN) 100 UNIT/ML FlexPen 30 mL 6  ?  Sig: Inject 0-12 units into the skin three times daily with meals. Per sliding scale.  ?  ? Endocrinology:  Diabetes - Insulins Failed - 10/21/2021 12:32 PM  ?  ?  Failed - HBA1C is between 0 and 7.9 and within 180 days  ?  Hemoglobin A1C  ?Date Value Ref Range Status  ?09/19/2021 8.2 (A) 4.0 - 5.6 % Final  ? ?Hgb A1c MFr Bld  ?Date Value Ref Range Status  ?06/06/2021 9.4 (H) 4.8 - 5.6 % Final  ?  Comment:  ?  (NOTE) ?Pre diabetes:          5.7%-6.4% ? ?Diabetes:              >6.4% ? ?Glycemic control for   <7.0% ?adults with diabetes ?  ?  ?  ?  ?  Passed - Valid encounter within last 6 months  ?  Recent Outpatient Visits   ? ?      ? 1 month ago Type 2 diabetes mellitus with other specified complication, without long-term current use of insulin ([place]  ? [facility]  ? 2 months ago Cloudy urine  ? [place] ? 4 months ago Type 2 diabetes mellitus with other specified complication, with long-term current use of insulin ([place]  ? [place]  ? 1 year ago Essential hypertension  ? [place]  ? 2 years ago Pedal edema  ? [place]  ? ?  ?  ?Future Appointments   ? ?        ? In 2 days [place]Thereasa Solo [place] ? ?  ? ?  ?  ?  ? ? ? ?Requested Prescriptions  ?Pending Prescriptions Disp Refills  ? insulin aspart (NOVOLOG FLEXPEN) 100 UNIT/ML FlexPen 30 mL 6  ?  Sig: Inject  0-12 units into the skin three times daily with meals. Per sliding scale.  ?  ? Endocrinology:  Diabetes - Insulins Failed - 10/21/2021 12:32 PM  ?  ?  Failed - HBA1C is between 0 and 7.9 and within 180 days  ?  Hemoglobin A1C  ?Date Value Ref Range Status  ?09/19/2021 8.2 (A) 4.0 - 5.6 % Final  ? ?Hgb A1c MFr Bld  ?Date Value Ref Range Status  ?06/06/2021 9.4 (H) 4.8 - 5.6 % Final  ?  Comment:  ?  (NOTE) ?Pre diabetes:          5.7%-6.4% ? ?Diabetes:              >6.4% ? ?Glycemic control for   <7.0% ?adults with diabetes ?  ?  ?  ?  ?  Passed - Valid encounter within last 6 months  ?  Recent Outpatient Visits   ? ?      ? 1 month ago Type 2 diabetes mellitus with other specified complication, without long-term current use of insulin (  [facility])  ? [facility] at [facility], MD  ? 2 months ago Cloudy urine  ? [place], [place], [place]  ? 4 months ago Type 2 diabetes mellitus with other specified complication, with long-term current use of insulin ([place])  ? [place] Charlott Rakes, MD  ? 1 year ago Essential hypertension  ? [place], Charlane Ferretti, MD  ? 2 years ago Pedal edema  ? [place] Charlott Rakes, MD  ? ?  ?  ?Future Appointments   ? ?        ? In 2 days Thereasa Solo, Cas[place] B[facility] [place]  ? ?  ? ?  ?  ?  ? ? ? ?

## 2021-10-23 ENCOUNTER — Other Ambulatory Visit: Payer: Self-pay

## 2021-10-23 MED ORDER — NOVOLOG FLEXPEN 100 UNIT/ML ~~LOC~~ SOPN
PEN_INJECTOR | SUBCUTANEOUS | 0 refills | Status: DC
  Filled 2021-10-23: qty 9, 25d supply, fill #0

## 2021-10-24 ENCOUNTER — Ambulatory Visit: Payer: Self-pay | Admitting: Physician Assistant

## 2021-10-24 ENCOUNTER — Other Ambulatory Visit: Payer: Self-pay

## 2021-10-24 DIAGNOSIS — E1169 Type 2 diabetes mellitus with other specified complication: Secondary | ICD-10-CM

## 2021-10-24 MED ORDER — GABAPENTIN 300 MG PO CAPS
ORAL_CAPSULE | Freq: Two times a day (BID) | ORAL | 6 refills | Status: DC
  Filled 2021-10-24: qty 60, 30d supply, fill #0
  Filled 2022-01-13: qty 60, 30d supply, fill #1
  Filled 2022-02-28: qty 60, 30d supply, fill #2

## 2021-10-25 ENCOUNTER — Other Ambulatory Visit: Payer: Self-pay

## 2021-10-31 ENCOUNTER — Other Ambulatory Visit: Payer: Self-pay

## 2021-10-31 ENCOUNTER — Other Ambulatory Visit: Payer: Self-pay | Admitting: Pharmacist

## 2021-10-31 ENCOUNTER — Other Ambulatory Visit: Payer: Self-pay | Admitting: Family Medicine

## 2021-10-31 MED ORDER — TRUE METRIX BLOOD GLUCOSE TEST VI STRP
1.0000 | ORAL_STRIP | 2 refills | Status: DC
  Filled 2021-10-31: qty 100, 30d supply, fill #0
  Filled 2021-12-03: qty 100, 30d supply, fill #1
  Filled 2022-01-03 (×2): qty 100, 30d supply, fill #2

## 2021-11-14 ENCOUNTER — Encounter: Payer: Self-pay | Admitting: Physician Assistant

## 2021-11-14 ENCOUNTER — Ambulatory Visit: Payer: 59 | Attending: Family Medicine | Admitting: Physician Assistant

## 2021-11-14 ENCOUNTER — Other Ambulatory Visit: Payer: Self-pay

## 2021-11-14 VITALS — BP 128/83 | HR 92 | Wt 327.6 lb

## 2021-11-14 DIAGNOSIS — M549 Dorsalgia, unspecified: Secondary | ICD-10-CM | POA: Diagnosis not present

## 2021-11-14 DIAGNOSIS — E1169 Type 2 diabetes mellitus with other specified complication: Secondary | ICD-10-CM | POA: Diagnosis not present

## 2021-11-14 DIAGNOSIS — N390 Urinary tract infection, site not specified: Secondary | ICD-10-CM

## 2021-11-14 DIAGNOSIS — R8281 Pyuria: Secondary | ICD-10-CM

## 2021-11-14 DIAGNOSIS — I890 Lymphedema, not elsewhere classified: Secondary | ICD-10-CM | POA: Diagnosis not present

## 2021-11-14 DIAGNOSIS — Z794 Long term (current) use of insulin: Secondary | ICD-10-CM

## 2021-11-14 LAB — POCT URINALYSIS DIP (CLINITEK)
Bilirubin, UA: NEGATIVE
Glucose, UA: NEGATIVE mg/dL
Ketones, POC UA: NEGATIVE mg/dL
Nitrite, UA: POSITIVE — AB
POC PROTEIN,UA: 100 — AB
Spec Grav, UA: 1.02 (ref 1.010–1.025)
Urobilinogen, UA: 0.2 E.U./dL
pH, UA: 5.5 (ref 5.0–8.0)

## 2021-11-14 LAB — GLUCOSE, POCT (MANUAL RESULT ENTRY): POC Glucose: 144 mg/dl — AB (ref 70–99)

## 2021-11-14 MED ORDER — DOXYCYCLINE HYCLATE 100 MG PO TABS
100.0000 mg | ORAL_TABLET | Freq: Two times a day (BID) | ORAL | 0 refills | Status: DC
  Filled 2021-11-14: qty 28, 14d supply, fill #0

## 2021-11-14 MED ORDER — FUROSEMIDE 20 MG PO TABS
20.0000 mg | ORAL_TABLET | Freq: Every day | ORAL | 1 refills | Status: DC
Start: 2021-11-14 — End: 2022-03-03
  Filled 2021-11-14: qty 90, 90d supply, fill #0
  Filled 2022-02-28: qty 90, 90d supply, fill #1

## 2021-11-14 MED ORDER — NOVOLOG FLEXPEN 100 UNIT/ML ~~LOC~~ SOPN
PEN_INJECTOR | SUBCUTANEOUS | 3 refills | Status: DC
Start: 2021-11-14 — End: 2022-03-18
  Filled 2021-11-14: qty 9, 25d supply, fill #0
  Filled 2021-12-09: qty 9, 25d supply, fill #1
  Filled 2022-02-03: qty 9, 25d supply, fill #2
  Filled 2022-02-28: qty 9, 25d supply, fill #3

## 2021-11-14 MED ORDER — ATORVASTATIN CALCIUM 20 MG PO TABS
ORAL_TABLET | Freq: Every day | ORAL | 1 refills | Status: DC
  Filled 2021-11-14: qty 90, 90d supply, fill #0
  Filled 2022-02-28: qty 90, 90d supply, fill #1

## 2021-11-14 MED ORDER — METHOCARBAMOL 500 MG PO TABS
ORAL_TABLET | ORAL | 1 refills | Status: DC
Start: 2021-11-14 — End: 2022-08-13
  Filled 2021-11-14: qty 90, 12d supply, fill #0
  Filled 2022-02-28: qty 90, 15d supply, fill #1

## 2021-11-14 MED ORDER — IBUPROFEN 600 MG PO TABS
600.0000 mg | ORAL_TABLET | Freq: Three times a day (TID) | ORAL | 0 refills | Status: DC | PRN
  Filled 2021-11-14: qty 60, 20d supply, fill #0

## 2021-11-14 NOTE — Progress Notes (Signed)
Patient ID: Taylor Hughes, male   DOB: 01/25/1971, 51 y.o.   MRN: [id number] ? ? ?Taylor Hughes, is a 51 y.o. male ? ?[id number] ? ?[id number] ? ?DOB - February 15, 1971 ? ?Chief Complaint  ?Patient presents with  ? Medication Refill  ? Back Pain  ? Leg Pain  ?    ? ?Subjective:  ? ?Taylor Hughes is a 51 y.o. male here today for starting to have L flank pain and cloudy urine like he had a couple of months ago.  He also need RF of lasix, atorvastatin and humalog.  No penile discharge.  Currently not sexually active and STD screening in January was neg but urine culture was positive for E coli.  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  No radicular s/sx.   ? ?No problems updated. ? ?ALLERGIES: ?No Known Allergies ? ?PAST MEDICAL HISTORY: ?Past Medical History:  ?Diagnosis Date  ? Atrial fibrillation ([place])   ? Diabetes mellitus without complication ([place])   ? Type 2  ? GERD (gastroesophageal reflux disease)   ? Wears glasses   ? Wound, open, foot   ? left diabetic   ? ? ?MEDICATIONS AT HOME: ?Prior to Admission medications   ?Medication Sig Start Date End Date Taking? Authorizing Provider  ?acetaminophen (TYLENOL) 325 MG tablet Take 650 mg by mouth every 6 (six) hours as needed for mild pain, fever or headache.   Yes [provider]  ?albuterol (VENTOLIN HFA) 108 (90 Base) MCG/ACT inhaler Inhale 2 puffs into the lungs every 6 (six) hours as needed for wheezing or shortness of breath. 06/18/21  Yes Charlott Rakes, MD  ?aspirin EC 81 MG tablet Take 81 mg by mouth daily. Swallow whole.   Yes [provider]  ?Blood Glucose Monitoring Suppl (TRUE METRIX METER) w/Device KIT Use to check blood sugar three times daily. 04/25/21  Yes Charlott Rakes, MD  ?doxycycline (VIBRA-TABS) 100 MG tablet Take 1 tablet (100 mg total) by mouth 2 (two) times daily. 11/14/21  Yes Argentina Donovan, PA-C  ?gabapentin (NEURONTIN) 300 MG capsule TAKE 1 CAPSULE (300 MG TOTAL) BY MOUTH 2  (TWO) TIMES DAILY. 10/24/21 10/24/22 Yes Charlott Rakes, MD  ?glucose blood (TRUE METRIX BLOOD GLUCOSE TEST) test strip Use as directed 10/31/21  Yes Charlott Rakes, MD  ?ibuprofen (ADVIL) 600 MG tablet Take 1 tablet (600 mg total) by mouth every 8 (eight) hours as needed. 11/14/21  Yes Argentina Donovan, PA-C  ?insulin detemir (LEVEMIR) 100 UNIT/ML FlexPen Inject 15 Units into the skin 2 (two) times daily. 06/13/21 06/13/22 Yes Charlott Rakes, MD  ?Insulin Pen Needle 32G X 5 MM MISC Use with levemir pen as prescribed 11/29/18  Yes [place] Donnamarie Poag, DO  ?lisinopril-hydrochlorothiazide (ZESTORETIC) 20-25 MG tablet Take 1 tablet by mouth daily. 09/19/21  Yes Dorna Mai, MD  ?methocarbamol (ROBAXIN) 500 MG tablet take 1 to 2 tabs every 6-8 hrs as needed for muscle spasms 11/14/21  Yes Argentina Donovan, PA-C  ?metoprolol tartrate (LOPRESSOR) 50 MG tablet TAKE 1 TABLET (50 MG TOTAL) BY MOUTH 2 (TWO) TIMES DAILY. 06/10/21 06/10/22 Yes Dwyane Dee, MD  ?TRUEplus Lancets 28G MISC Use to check blood sugar daily. 01/02/21  Yes Charlott Rakes, MD  ?atorvastatin (LIPITOR) 20 MG tablet TAKE 1 TABLET (20 MG TOTAL) BY MOUTH DAILY. 11/14/21 11/14/22  Argentina Donovan, PA-C  ?furosemide (LASIX) 20 MG tablet Take 1 tablet (20 mg total) by mouth daily. 11/14/21  Freeman Caldron M, PA-C  ?insulin aspart (NOVOLOG FLEXPEN) 100 UNIT/ML FlexPen Inject 0-12 units into the skin three times daily with meals. Per sliding scale. 11/14/21 11/14/22  Argentina Donovan, PA-C  ? ? ?ROS: ?Neg HEENT ?Neg resp ?Neg cardiac ?Neg psych ?Neg neuro ? ?Objective:  ? ?Vitals:  ? 11/14/21 1053  ?BP: 128/83  ?Pulse: 92  ?SpO2: 99%  ?Weight: (!) 327 lb 9.6 oz (148.6 kg)  ? ?Exam ?General appearance : Awake, alert, not in any distress. Speech Clear. Not toxic looking ?HEENT: Atraumatic and Normocephalic ?Neck: Supple, no JVD. No cervical lymphadenopathy.  ?Chest: Good air entry bilaterally, CTAB.  No rales/rhonchi/wheezing ?CVS: S1 S2 regular, no murmurs.  ?Extremities:  B/L Lower Ext shows unchaged edema, both legs are warm to touch ?Neurology: Awake alert, and oriented X 3, CN II-XII intact, Non focal ?Skin: No Rash ? ?Data Review ?Lab Results  ?Component Value Date  ? HGBA1C 8.2 (A) 09/19/2021  ? HGBA1C 9.4 (H) 06/06/2021  ? HGBA1C 12.7 (H) 05/27/2019  ? ? ?Assessment & Plan  ? ?1. Type 2 diabetes mellitus with other specified complication, with long-term current use of insulin ([place]) ?Continue current regimen and work on diabetic diet ?- Glucose (CBG) ?- atorvastatin (LIPITOR) 20 MG tablet; TAKE 1 TABLET (20 MG TOTAL) BY MOUTH DAILY.  Dispense: 90 tablet; Refill: 1 ?- insulin aspart (NOVOLOG FLEXPEN) 100 UNIT/ML FlexPen; Inject 0-12 units into the skin three times daily with meals. Per sliding scale.  Dispense: 9 mL; Refill: 3 ?- Comprehensive metabolic panel ? ?2. Lymphedema ?Use ted hose as recommended by vascular ?- furosemide (LASIX) 20 MG tablet; Take 1 tablet (20 mg total) by mouth daily.  Dispense: 90 tablet; Refill: 1 ?- Comprehensive metabolic panel ? ?3. Back pain, unspecified back location, unspecified back pain laterality, unspecified chronicity ?- ibuprofen (ADVIL) 600 MG tablet; Take 1 tablet (600 mg total) by mouth every 8 (eight) hours as needed.  Dispense: 60 tablet; Refill: 0 ?- methocarbamol (ROBAXIN) 500 MG tablet; take 1 to 2 tabs every 6-8 hrs as needed for muscle spasms  Dispense: 90 tablet; Refill: 1 ?- Comprehensive metabolic panel ? ?4. Pyuria ?- POCT URINALYSIS DIP (CLINITEK) ?- Urine Culture ? ?5. Recurrent UTI ?Drink water up to any fluid restrictions  ?- Ambulatory referral to Urology ?- doxycycline (VIBRA-TABS) 100 MG tablet; Take 1 tablet (100 mg total) by mouth 2 (two) times daily.  Dispense: 28 tablet; Refill: 0 ? ? ? ?Patient have been counseled extensively about nutrition and exercise. Other issues discussed during this visit include: low cholesterol diet, weight control and daily exercise, foot care, annual eye examinations at Ophthalmology,  importance of adherence with medications and regular follow-up. We also discussed long term complications of uncontrolled diabetes and hypertension.  ? ?Return in about 3 months (around 02/13/2022) for see PCP for chronic conditions and A1C. ? ?The patient was given clear instructions to go to ER or return to medical center if symptoms don't improve, worsen or new problems develop. The patient verbalized understanding. The patient was told to call to get lab results if they haven't heard anything in the next week.  ? ? ? ? ?Freeman Caldron, PA-C ?[place] ?[place], [place] ?[phone]   ?11/14/2021, 12:23 PM  ?

## 2021-11-14 NOTE — Progress Notes (Signed)
144 ?

## 2021-11-14 NOTE — Patient Instructions (Signed)
Lymphedema Lymphedema is swelling that is caused by the abnormal collection of lymph in the tissues under the skin. Lymph is excess fluid from the tissues in your body that is removed through the lymphatic system. This system is part of your body's defense system (immune system) and includes lymph nodes and lymph vessels. The lymph vessels collect and carry the excess fluid, fats, proteins, and waste from the tissues of the body to the bloodstream. This system also works to clean and remove bacteria and waste products from the body. Lymphedema occurs when the lymphatic system is blocked. When the lymph vessels or lymph nodes are blocked or damaged, lymph does not drain properly. This causes an abnormal buildup of lymph, which leads to swelling in the affected area. This may include the trunk area, or an arm or leg. Lymphedema cannot be cured by medicines, but various methods can be used to help reduce the swelling. What are the causes? The cause of this condition depends on the type of lymphedema that you have. Primary lymphedema is caused by the absence of lymph vessels or having abnormal lymph vessels at birth. Secondary lymphedema occurs when lymph vessels are blocked or damaged. Secondary lymphedema is more common. Common causes of lymph vessel blockage include: Skin infection, such as cellulitis. Infection by parasites (filariasis). Injury. Radiation therapy. Cancer. Formation of scar tissue. Surgery. What are the signs or symptoms? Symptoms of this condition include: Swelling of the arm or leg. A heavy or tight feeling in the arm or leg. Swelling of the feet, toes, or fingers. Shoes or rings may fit more tightly than before. Redness of the skin over the affected area. Limited movement of the affected limb. Sensitivity to touch or discomfort in the affected limb. How is this diagnosed? This condition may be diagnosed based on: Your symptoms and medical history. A physical  exam. Bioimpedance spectroscopy. In this test, painless electrical currents are used to measure fluid levels in your body. Imaging tests, such as: MRI. CT scan. Duplex ultrasound. This test uses sound waves to produce images of the vessels and the blood flow on a screen. Lymphoscintigraphy. In this test, a low dose of a radioactive substance is injected to trace the flow of lymph through your lymph vessels. Lymphangiography. In this test, a contrast dye is injected into the lymph vessel to help show blockages. How is this treated? If an underlying condition is causing the lymphedema, that condition will be treated. For example, antibiotic medicines may be used to treat an infection. Treatment for this condition will depend on the cause of your lymphedema. Treatment may include: Complete decongestive therapy (CDT). This is done by a certified lymphedema therapist to reduce fluid congestion. This therapy includes: Skin care. Compression wrapping of the affected area. Manual lymph drainage. This is a special massage technique that promotes lymph drainage out of a limb. Specific exercises. Certain exercises can help fluid move out of the affected limb. Compression. Various methods may be used to apply pressure to the affected limb to reduce the swelling. They include: Wearing compression stockings or sleeves on the affected limb. Wrapping the affected limb with special bandages. Surgery. This is usually done for severe cases only. For example, surgery may be done if you have trouble moving the limb or if the swelling does not get better with other treatments. Follow these instructions at home: Self-care The affected area is more likely to become injured or infected. Take these steps to help prevent infection: Keep the affected area clean   and dry. Use approved creams or lotions to keep the skin moisturized. Protect your skin from cuts: Use gloves while cooking or gardening. Do not walk  barefoot. If you shave the affected area, use an electric razor. Do not wear tight clothes, shoes, or jewelry. Eat a healthy diet that includes a lot of fruits and vegetables. Activity Do exercises as told by your health care provider. Do not sit with your legs crossed. When possible, keep the affected limb raised (elevated) above the level of your heart. Avoid carrying things with an arm that is affected by lymphedema. General instructions Wear compression stockings or sleeves as told by your health care provider. Note any changes in size of the affected limb. You may be instructed to take regular measurements and keep track of them. Take over-the-counter and prescription medicines only as told by your health care provider. If you were prescribed an antibiotic medicine, take or apply it as told by your health care provider. Do not stop using the antibiotic even if you start to feel better or if your condition improves. Do not use heating pads or ice packs on the affected area. Avoid having blood draws, IV insertions, or blood pressure checks on the affected limb. Keep all follow-up visits. This is important. Contact a health care provider if you: Continue to have swelling in your limb. Have fluid leaking from the skin of your swollen limb. Have a cut that does not heal. Have redness or pain in the affected area. Develop purplish spots, rash, blisters, or sores (lesions) on your affected limb. Get help right away if you: Have new swelling in your limb that starts suddenly. Have shortness of breath or chest pain. Have a fever or chills. These symptoms may represent a serious problem that is an emergency. Do not wait to see if the symptoms will go away. Get medical help right away. Call your local emergency services (911 in the [place] Do not drive yourself to the hospital. Summary Lymphedema is swelling that is caused by the abnormal collection of lymph in the tissues under the  skin. Lymph is fluid from the tissues in your body that is removed through the lymphatic system. This system collects and carries excess fluid, fats, proteins, and wastes from the tissues of the body to the bloodstream. Lymphedema causes swelling, pain, and redness in the affected area. This may include the trunk area, or an arm or leg. Treatment for this condition may depend on the cause of your lymphedema. Treatment may include treating the underlying cause, complete decongestive therapy (CDT), compression methods, or surgery. This information is not intended to replace advice given to you by your health care provider. Make sure you discuss any questions you have with your health care provider. Document Revised: 05/23/2020 Document Reviewed: 05/23/2020 Elsevier Patient Education  2022 [employer].  

## 2021-11-15 LAB — COMPREHENSIVE METABOLIC PANEL
ALT: 28 IU/L (ref 0–44)
AST: 26 IU/L (ref 0–40)
Albumin/Globulin Ratio: 1.2 (ref 1.2–2.2)
Albumin: 4 g/dL (ref 4.0–5.0)
Alkaline Phosphatase: 110 IU/L (ref 44–121)
BUN/Creatinine Ratio: 34 — ABNORMAL HIGH (ref 9–20)
BUN: 36 mg/dL — ABNORMAL HIGH (ref 6–24)
Bilirubin Total: <0.2 mg/dL (ref 0.0–1.2)
CO2: 21 mmol/L (ref 20–29)
Calcium: 9.4 mg/dL (ref 8.7–10.2)
Chloride: 106 mmol/L (ref 96–106)
Creatinine, Ser: 1.06 mg/dL (ref 0.76–1.27)
Globulin, Total: 3.3 g/dL (ref 1.5–4.5)
Glucose: 65 mg/dL — ABNORMAL LOW (ref 70–99)
Potassium: 4.9 mmol/L (ref 3.5–5.2)
Sodium: 144 mmol/L (ref 134–144)
Total Protein: 7.3 g/dL (ref 6.0–8.5)
eGFR: 86 mL/min/{1.73_m2} (ref >59)

## 2021-11-20 LAB — URINE CULTURE

## 2021-12-03 ENCOUNTER — Other Ambulatory Visit: Payer: Self-pay

## 2021-12-03 ENCOUNTER — Other Ambulatory Visit: Payer: Self-pay | Admitting: Physician Assistant

## 2021-12-03 DIAGNOSIS — M549 Dorsalgia, unspecified: Secondary | ICD-10-CM

## 2021-12-04 ENCOUNTER — Other Ambulatory Visit: Payer: Self-pay

## 2021-12-04 MED ORDER — IBUPROFEN 600 MG PO TABS
600.0000 mg | ORAL_TABLET | Freq: Three times a day (TID) | ORAL | 0 refills | Status: DC | PRN
  Filled 2021-12-04: qty 60, 20d supply, fill #0

## 2021-12-05 ENCOUNTER — Other Ambulatory Visit: Payer: Self-pay

## 2021-12-09 ENCOUNTER — Other Ambulatory Visit: Payer: Self-pay

## 2021-12-09 ENCOUNTER — Encounter: Payer: Self-pay | Admitting: Urology

## 2021-12-09 ENCOUNTER — Ambulatory Visit (INDEPENDENT_AMBULATORY_CARE_PROVIDER_SITE_OTHER): Payer: 59 | Admitting: Urology

## 2021-12-09 VITALS — BP 160/79 | HR 72 | Ht 71.0 in | Wt 325.0 lb

## 2021-12-09 DIAGNOSIS — M545 Low back pain, unspecified: Secondary | ICD-10-CM | POA: Diagnosis not present

## 2021-12-09 DIAGNOSIS — Z87440 Personal history of urinary (tract) infections: Secondary | ICD-10-CM

## 2021-12-09 DIAGNOSIS — R3129 Other microscopic hematuria: Secondary | ICD-10-CM

## 2021-12-09 DIAGNOSIS — N390 Urinary tract infection, site not specified: Secondary | ICD-10-CM

## 2021-12-09 LAB — URINALYSIS, COMPLETE
Bilirubin, UA: NEGATIVE
Ketones, UA: NEGATIVE
Leukocytes,UA: NEGATIVE
Nitrite, UA: NEGATIVE
Specific Gravity, UA: 1.02 (ref 1.005–1.030)
Urobilinogen, Ur: 0.2 mg/dL (ref 0.2–1.0)
pH, UA: 6.5 (ref 5.0–7.5)

## 2021-12-09 LAB — MICROSCOPIC EXAMINATION

## 2021-12-09 NOTE — Progress Notes (Signed)
? ?12/09/2021 ?2:55 PM  ? ?Taylor Hughes ?1970-12-02 ?[id number] ? ?Referring provider: Argentina Donovan, PA-C ?[address] ?[address] ?[place],  [place] [address] ? ?Chief Complaint  ?Patient presents with  ? Recurrent UTI  ? ? ?HPI: ?Taylor Hughes is a 51 y.o. male referred for recurrent UTI. ? ?PCP visit 08/15/2021 with complaints of dark/cloudy urine for several weeks with onset of right flank pain ?Was having intermittent dysuria.  No fever or chills ?UA nitrite positive with small leukocytes ?Received 0.5 g Rocephin IM and treated with a 15-day course of doxycycline ?Urine culture grew pansensitive E. Coli ?Follow-up visit 11/14/2021 with complaints of right low back pain.  Note does not mention any voiding complaints ?Dipstick UA with moderate blood, nitrite positive and small leukocytes.  Treated with 2-week course doxycycline ?Urine culture grew pansensitive E. Coli ? ?His only complaint today is right low back pain ?No bothersome LUTS and denies gross hematuria ?Denies previous history of urologic problems. ? ? ?PMH: ?Past Medical History:  ?Diagnosis Date  ? Atrial fibrillation ([place])   ? Diabetes mellitus without complication ([place])   ? Type 2  ? GERD (gastroesophageal reflux disease)   ? Wears glasses   ? Wound, open, foot   ? left diabetic   ? ? ?Surgical History: ?Past Surgical History:  ?Procedure Laterality Date  ? AMPUTATION Left 08/20/2018  ? Procedure: LEFT FOOT IRRIGATON AND DEBRIDEMENT, 5TH RAY AMPUTATION;  Surgeon: Newt Minion, MD;  Location: [facility];  Service: Orthopedics;  Laterality: Left;  ? APPLICATION OF A-CELL OF CHEST/ABDOMEN N/A 05/30/2019  ? Procedure: Application Of A-Cell Of Chest;  Surgeon: Lajuana Matte, MD;  Location: [facility];  Service: Cardiothoracic;  Laterality: N/A;  ? APPLICATION OF WOUND VAC N/A 05/30/2019  ? Procedure: Application Of Wound Vac;  Surgeon: Lajuana Matte, MD;  Location: [facility];  Service: Cardiothoracic;  Laterality: N/A;  ? FINGER SURGERY Right 2016  ? I&D   small finger  ? I & D EXTREMITY Left 07/30/2018  ? Procedure: IRRIGATION AND DEBRIDEMENT LEFT FOOT WITH POSSIBLE AMPUTATION OF FIFTH TOE;  Surgeon: Mcarthur Rossetti, MD;  Location: [facility];  Service: Orthopedics;  Laterality: Left;  ? INCISION AND DRAINAGE ABSCESS Left 01/19/2019  ? Procedure: INCISION AND DRAINAGE ABSCESS upper chest;  Surgeon: Melida Quitter, MD;  Location: [facility];  Service: ENT;  Laterality: Left;  ? IRRIGATION AND DEBRIDEMENT STERNOCLAVICULAR JOINT-STERNUM AND RIBS N/A 05/30/2019  ? Procedure: IRRIGATION AND DEBRIDEMENT OF STERNOCLAVICULAR JOINT-STERNUM AND RIBS ;  Surgeon: Lajuana Matte, MD;  Location: [facility];  Service: Cardiothoracic;  Laterality: N/A;  ? MINOR IRRIGATION AND DEBRIDEMENT OF WOUND N/A 11/22/2018  ? Procedure: INCISION AND DRAINAGE OF NECK ABSCESS;  Surgeon: Melida Quitter, MD;  Location: [facility];  Service: ENT;  Laterality: N/A;  ? ? ?Home Medications:  ?Allergies as of 12/09/2021   ?No Known Allergies ?  ? ?  ?Medication List  ?  ? ?  ? Accurate as of Dec 09, 2021  2:55 PM. If you have any questions, ask your nurse or doctor.  ?  ?  ? ?  ? ?acetaminophen 325 MG tablet ?Commonly known as: TYLENOL ?Take 650 mg by mouth every 6 (six) hours as needed for mild pain, fever or headache. ?  ?albuterol 108 (90 Base) MCG/ACT inhaler ?Commonly known as: VENTOLIN HFA ?Inhale 2 puffs into the lungs every 6 (six) hours as needed for wheezing or shortness of breath. ?  ?aspirin EC 81  MG tablet ?Take 81 mg by mouth daily. Swallow whole. ?  ?atorvastatin 20 MG tablet ?Commonly known as: LIPITOR ?TAKE 1 TABLET (20 MG TOTAL) BY MOUTH DAILY. ?  ?doxycycline 100 MG tablet ?Commonly known as: VIBRA-TABS ?Take 1 tablet (100 mg total) by mouth 2 (two) times daily. ?  ?furosemide 20 MG tablet ?Commonly known as: LASIX ?Take 1 tablet (20 mg total) by mouth daily. ?  ?gabapentin 300 MG capsule ?Commonly known as: NEURONTIN ?TAKE 1 CAPSULE (300 MG TOTAL) BY MOUTH 2 (TWO) TIMES DAILY. ?  ?ibuprofen 600  MG tablet ?Commonly known as: ADVIL ?Take 1 tablet (600 mg total) by mouth every 8 (eight) hours as needed. ?  ?Insulin Pen Needle 32G X 5 MM Misc ?Use with levemir pen as prescribed ?  ?Levemir FlexPen 100 UNIT/ML FlexPen ?Generic drug: insulin detemir ?Inject 15 Units into the skin 2 (two) times daily. ?  ?lisinopril-hydrochlorothiazide 20-25 MG tablet ?Commonly known as: ZESTORETIC ?Take 1 tablet by mouth daily. ?  ?methocarbamol 500 MG tablet ?Commonly known as: Robaxin ?take 1 to 2 tabs every 6-8 hrs as needed for muscle spasms ?  ?metoprolol tartrate 50 MG tablet ?Commonly known as: LOPRESSOR ?TAKE 1 TABLET (50 MG TOTAL) BY MOUTH 2 (TWO) TIMES DAILY. ?  ?NovoLOG FlexPen 100 UNIT/ML FlexPen ?Generic drug: insulin aspart ?Inject 0-12 units into the skin three times daily with meals. Per sliding scale. ?  ?True Metrix Blood Glucose Test test strip ?Generic drug: glucose blood ?Use as directed ?  ?True Metrix Meter w/Device Kit ?Use to check blood sugar three times daily. ?  ?TRUEplus Lancets 28G Misc ?Use to check blood sugar daily. ?  ? ?  ? ? ?Allergies: No Known Allergies ? ?Family History: ?Family History  ?Problem Relation Age of Onset  ? Diabetes Mother   ? ? ?Social History:  reports that he has never smoked. He has never used smokeless tobacco. He reports that he does not drink alcohol and does not use drugs. ? ? ?Physical Exam: ?BP (!) 160/79   Pulse 72   Ht _0  (1.803 m)   Wt (!) 325 lb (147.4 kg)   BMI 45.33 kg/m?   ?Constitutional:  Alert and oriented, No acute distress. ?HEENT: [place] AT, moist mucus membranes.  Trachea midline, no masses. ?Cardiovascular: No clubbing, cyanosis, or edema. ?Respiratory: Normal respiratory effort, no increased work of breathing. ?GI: Abdomen is soft, nontender, nondistended, no abdominal masses ?GU: No CVA tenderness ?Skin: No rashes, bruises or suspicious lesions. ?Neurologic: Grossly intact, no focal deficits, moving all 4 extremities. ?Psychiatric: Normal mood  and affect. ? ?Laboratory Data: ? ?Urinalysis ?Dipstick trace glucose/1+ blood/1+ protein ?Microscopy 3-10 RBC/0-5 WBC ? ? ?Assessment & Plan:   ? ?1. Recurrent UTI ?51 y.o. male with recurrent bacteriuria.  He was symptomatic on initial presentation and on follow-up complained of right low back pain which is more in line with musculoskeletal pain.  Pyuria has resolved.  UA today with 3-10 RBC; urine culture repeated ?Recommend scheduling renal ultrasound to evaluate for upper tract abnormalities ?Order entered and will call with results ?Further recommendations pending renal ultrasound review ? ? ? ?Abbie Sons, MD ? ?[facility] ?[address], [address] ?[place], [place] [address] ?[phone] ? ?

## 2021-12-10 ENCOUNTER — Encounter: Payer: Self-pay | Admitting: Urology

## 2021-12-13 LAB — CULTURE, URINE COMPREHENSIVE

## 2021-12-30 ENCOUNTER — Ambulatory Visit (HOSPITAL_COMMUNITY)
Admission: RE | Admit: 2021-12-30 | Discharge: 2021-12-30 | Disposition: A | Discharge status: Home / Self Care | Payer: 59 | Source: Ambulatory Visit | Attending: Urology | Admitting: Urology

## 2021-12-30 DIAGNOSIS — R3129 Other microscopic hematuria: Secondary | ICD-10-CM | POA: Diagnosis present

## 2021-12-30 DIAGNOSIS — N390 Urinary tract infection, site not specified: Secondary | ICD-10-CM | POA: Insufficient documentation

## 2021-12-31 ENCOUNTER — Other Ambulatory Visit: Payer: Self-pay | Admitting: Pharmacist

## 2021-12-31 ENCOUNTER — Other Ambulatory Visit: Payer: Self-pay

## 2022-01-01 NOTE — Chronic Care Management (AMB) (Signed)
Patient outreached by Barnet Pall, PharmD Candidate on 01/01/22 to discuss hypertension.   Patient has an automated home blood pressure machine. They report home readings 120s/80s  Medication review was performed. They are taking medications as prescribed.   The following barriers to adherence were noted: - Affordability - notes affordability concerns with with his medications that put financial strain on him to do other things sometimes.   The following interventions were completed:  - Medications were reviewed - Patient was educated on medications, including indication and administration - Patient was educated on proper technique to check home blood pressure and reminded to bring home machine and readings to next provider appointment - Patient was counseled on lifestyle modifications to improve blood pressure  Contacted patient to discuss affordability. He notes that he used to pay $10 for insulin and test strips, and now is paying $30 for insulin and $25 for 100 strips. He is testing glucose four times daily.   Scheduled with embedded pharmacist to discuss medication cost/access.    The patient has follow up scheduled:  Embedded pharmacist: 02/17/22 PCP: 03/03/22  Catie Hedwig Morton, PharmD, [facility] [phone]

## 2022-01-02 ENCOUNTER — Encounter: Payer: Self-pay | Admitting: *Deleted

## 2022-01-03 ENCOUNTER — Other Ambulatory Visit: Payer: Self-pay

## 2022-01-13 ENCOUNTER — Other Ambulatory Visit: Payer: Self-pay

## 2022-01-14 ENCOUNTER — Telehealth: Payer: Self-pay | Admitting: Family Medicine

## 2022-01-14 NOTE — Telephone Encounter (Signed)
Copied from [facility] [id number]. Topic: General - Other >> Jan 13, 2022  2:29 PM Taylor Hughes A wrote: Reason for CRM: The patient has called to request a letter regarding their diabetes as well as their vision   The patient shares that a letter approving their ability to drive is required by the [facility]   Please contact the patient further when possible

## 2022-01-16 NOTE — Telephone Encounter (Signed)
I am unable to speak to his vision and cannot just assume that he is able to drive. Please advise him to schedule an in person office visit with me as I need additional information. Thanks

## 2022-01-16 NOTE — Telephone Encounter (Signed)
Routing to PCP for review.

## 2022-01-28 NOTE — Telephone Encounter (Signed)
Patient has upcoming appointment with provider [facility] to discuss.

## 2022-01-30 ENCOUNTER — Ambulatory Visit: Payer: 59 | Admitting: Physician Assistant

## 2022-01-30 DIAGNOSIS — Z794 Long term (current) use of insulin: Secondary | ICD-10-CM

## 2022-02-03 ENCOUNTER — Other Ambulatory Visit: Payer: Self-pay

## 2022-02-17 ENCOUNTER — Other Ambulatory Visit: Payer: Self-pay | Admitting: Family Medicine

## 2022-02-17 ENCOUNTER — Ambulatory Visit: Payer: 59 | Admitting: Pharmacist

## 2022-02-17 ENCOUNTER — Other Ambulatory Visit: Payer: Self-pay

## 2022-02-17 DIAGNOSIS — M549 Dorsalgia, unspecified: Secondary | ICD-10-CM

## 2022-02-18 ENCOUNTER — Other Ambulatory Visit: Payer: Self-pay

## 2022-02-18 ENCOUNTER — Other Ambulatory Visit: Payer: Self-pay | Admitting: Family Medicine

## 2022-02-19 ENCOUNTER — Other Ambulatory Visit: Payer: Self-pay

## 2022-02-19 MED ORDER — TRUE METRIX BLOOD GLUCOSE TEST VI STRP
1.0000 | ORAL_STRIP | 0 refills | Status: DC
  Filled 2022-02-19: qty 100, 30d supply, fill #0

## 2022-02-24 ENCOUNTER — Other Ambulatory Visit: Payer: Self-pay

## 2022-02-28 ENCOUNTER — Other Ambulatory Visit: Payer: Self-pay

## 2022-03-03 ENCOUNTER — Other Ambulatory Visit: Payer: Self-pay

## 2022-03-03 ENCOUNTER — Encounter: Payer: Self-pay | Admitting: Family Medicine

## 2022-03-03 ENCOUNTER — Ambulatory Visit: Payer: 59 | Attending: Family Medicine | Admitting: Family Medicine

## 2022-03-03 VITALS — BP 133/83 | HR 84 | Temp 98.3°F | Ht 71.0 in | Wt 330.0 lb

## 2022-03-03 DIAGNOSIS — I152 Hypertension secondary to endocrine disorders: Secondary | ICD-10-CM

## 2022-03-03 DIAGNOSIS — I890 Lymphedema, not elsewhere classified: Secondary | ICD-10-CM

## 2022-03-03 DIAGNOSIS — E1169 Type 2 diabetes mellitus with other specified complication: Secondary | ICD-10-CM

## 2022-03-03 DIAGNOSIS — E1159 Type 2 diabetes mellitus with other circulatory complications: Secondary | ICD-10-CM

## 2022-03-03 DIAGNOSIS — Z1159 Encounter for screening for other viral diseases: Secondary | ICD-10-CM

## 2022-03-03 DIAGNOSIS — E1142 Type 2 diabetes mellitus with diabetic polyneuropathy: Secondary | ICD-10-CM | POA: Diagnosis not present

## 2022-03-03 DIAGNOSIS — S91209A Unspecified open wound of unspecified toe(s) with damage to nail, initial encounter: Secondary | ICD-10-CM

## 2022-03-03 DIAGNOSIS — L84 Corns and callosities: Secondary | ICD-10-CM

## 2022-03-03 DIAGNOSIS — Z794 Long term (current) use of insulin: Secondary | ICD-10-CM

## 2022-03-03 DIAGNOSIS — Z6841 Body Mass Index (BMI) 40.0 and over, adult: Secondary | ICD-10-CM

## 2022-03-03 DIAGNOSIS — K219 Gastro-esophageal reflux disease without esophagitis: Secondary | ICD-10-CM

## 2022-03-03 LAB — POCT GLYCOSYLATED HEMOGLOBIN (HGB A1C): HbA1c, POC (controlled diabetic range): 9.7 % — AB (ref 0.0–7.0)

## 2022-03-03 LAB — GLUCOSE, POCT (MANUAL RESULT ENTRY): POC Glucose: 261 mg/dl — AB (ref 70–99)

## 2022-03-03 MED ORDER — OMEPRAZOLE 40 MG PO CPDR
40.0000 mg | DELAYED_RELEASE_CAPSULE | Freq: Every day | ORAL | 3 refills | Status: DC
  Filled 2022-03-03: qty 30, 30d supply, fill #0
  Filled 2022-04-22: qty 30, 30d supply, fill #1
  Filled 2022-05-15 (×2): qty 30, 30d supply, fill #2
  Filled 2022-06-16: qty 30, 30d supply, fill #3

## 2022-03-03 MED ORDER — OZEMPIC (0.25 OR 0.5 MG/DOSE) 2 MG/3ML ~~LOC~~ SOPN
0.2500 mg | PEN_INJECTOR | SUBCUTANEOUS | 1 refills | Status: DC
  Filled 2022-03-03: qty 3, 56d supply, fill #0

## 2022-03-03 MED ORDER — GABAPENTIN 300 MG PO CAPS
600.0000 mg | ORAL_CAPSULE | Freq: Every day | ORAL | 6 refills | Status: DC
Start: 2022-03-03 — End: 2022-08-13

## 2022-03-03 MED ORDER — FUROSEMIDE 20 MG PO TABS
20.0000 mg | ORAL_TABLET | Freq: Every day | ORAL | 1 refills | Status: DC
  Filled 2022-03-03 – 2022-06-23 (×2): qty 90, 90d supply, fill #0
  Filled 2022-10-27 (×2): qty 90, 90d supply, fill #1

## 2022-03-03 NOTE — Patient Instructions (Signed)
Semaglutide Injection What is this medication? SEMAGLUTIDE (SEM a GLOO tide) treats type 2 diabetes. It works by increasing insulin levels in your body, which decreases your blood sugar (glucose). It also reduces the amount of sugar released into the blood and slows down your digestion. It can also be used to lower the risk of heart attack and stroke in people with type 2 diabetes. Changes to diet and exercise are often combined with this medication. This medicine may be used for other purposes; ask your health care provider or pharmacist if you have questions. COMMON BRAND NAME(S): OZEMPIC What should I tell my care team before I take this medication? They need to know if you have any of these conditions: Endocrine tumors (MEN 2) or if someone in your family had these tumors Eye disease, vision problems History of pancreatitis Kidney disease Stomach problems Thyroid cancer or if someone in your family had thyroid cancer An unusual or allergic reaction to semaglutide, other medications, foods, dyes, or preservatives Pregnant or trying to get pregnant Breast-feeding How should I use this medication? This medication is for injection under the skin of your upper leg (thigh), stomach area, or upper arm. It is given once every week (every 7 days). You will be taught how to prepare and give this medication. Use exactly as directed. Take your medication at regular intervals. Do not take it more often than directed. If you use this medication with insulin, you should inject this medication and the insulin separately. Do not mix them together. Do not give the injections right next to each other. Change (rotate) injection sites with each injection. It is important that you put your used needles and syringes in a special sharps container. Do not put them in a trash can. If you do not have a sharps container, call your pharmacist or care team to get one. A special MedGuide will be given to you by the  pharmacist with each prescription and refill. Be sure to read this information carefully each time. This medication comes with INSTRUCTIONS FOR USE. Ask your pharmacist for directions on how to use this medication. Read the information carefully. Talk to your pharmacist or care team if you have questions. Talk to your care team about the use of this medication in children. Special care may be needed. Overdosage: If you think you have taken too much of this medicine contact a poison control center or emergency room at once. NOTE: This medicine is only for you. Do not share this medicine with others. What if I miss a dose? If you miss a dose, take it as soon as you can within 5 days after the missed dose. Then take your next dose at your regular weekly time. If it has been longer than 5 days after the missed dose, do not take the missed dose. Take the next dose at your regular time. Do not take double or extra doses. If you have questions about a missed dose, contact your care team for advice. What may interact with this medication? Other medications for diabetes Many medications may cause changes in blood sugar, these include: Alcohol containing beverages Antiviral medications for HIV or AIDS Aspirin and aspirin-like medications Certain medications for blood pressure, heart disease, irregular heart beat Chromium Diuretics Male hormones, such as estrogens or progestins, birth control pills Fenofibrate Gemfibrozil Isoniazid Lanreotide Male hormones or anabolic steroids MAOIs like Carbex, Eldepryl, Marplan, Nardil, and Parnate Medications for weight loss Medications for allergies, asthma, cold, or cough Medications for depression,   anxiety, or psychotic disturbances Niacin Nicotine NSAIDs, medications for pain and inflammation, like ibuprofen or naproxen Octreotide Pasireotide Pentamidine Phenytoin Probenecid Quinolone antibiotics such as ciprofloxacin, levofloxacin, ofloxacin Some  herbal dietary supplements Steroid medications such as prednisone or cortisone Sulfamethoxazole; trimethoprim Thyroid hormones Some medications can hide the warning symptoms of low blood sugar (hypoglycemia). You may need to monitor your blood sugar more closely if you are taking one of these medications. These include: Beta-blockers, often used for high blood pressure or heart problems (examples include atenolol, metoprolol, propranolol) Clonidine Guanethidine Reserpine This list may not describe all possible interactions. Give your health care provider a list of all the medicines, herbs, non-prescription drugs, or dietary supplements you use. Also tell them if you smoke, drink alcohol, or use illegal drugs. Some items may interact with your medicine. What should I watch for while using this medication? Visit your care team for regular checks on your progress. Drink plenty of fluids while taking this medication. Check with your care team if you get an attack of severe diarrhea, nausea, and vomiting. The loss of too much body fluid can make it dangerous for you to take this medication. A test called the HbA1C (A1C) will be monitored. This is a simple blood test. It measures your blood sugar control over the last 2 to 3 months. You will receive this test every 3 to 6 months. Learn how to check your blood sugar. Learn the symptoms of low and high blood sugar and how to manage them. Always carry a quick-source of sugar with you in case you have symptoms of low blood sugar. Examples include hard sugar candy or glucose tablets. Make sure others know that you can choke if you eat or drink when you develop serious symptoms of low blood sugar, such as seizures or unconsciousness. They must get medical help at once. Tell your care team if you have high blood sugar. You might need to change the dose of your medication. If you are sick or exercising more than usual, you might need to change the dose of your  medication. Do not skip meals. Ask your care team if you should avoid alcohol. Many nonprescription cough and cold products contain sugar or alcohol. These can affect blood sugar. Pens should never be shared. Even if the needle is changed, sharing may result in passing of viruses like hepatitis or HIV. Wear a medical ID bracelet or chain, and carry a card that describes your disease and details of your medication and dosage times. Do not become pregnant while taking this medication. Women should inform their care team if they wish to become pregnant or think they might be pregnant. There is a potential for serious side effects to an unborn child. Talk to your care team for more information. What side effects may I notice from receiving this medication? Side effects that you should report to your care team as soon as possible: Allergic reactions--skin rash, itching, hives, swelling of the face, lips, tongue, or throat Change in vision Dehydration--increased thirst, dry mouth, feeling faint or lightheaded, headache, dark yellow or Trettel urine Gallbladder problems--severe stomach pain, nausea, vomiting, fever Heart palpitations--rapid, pounding, or irregular heartbeat Kidney injury--decrease in the amount of urine, swelling of the ankles, hands, or feet Pancreatitis--severe stomach pain that spreads to your back or gets worse after eating or when touched, fever, nausea, vomiting Thyroid cancer--new mass or lump in the neck, pain or trouble swallowing, trouble breathing, hoarseness Side effects that usually do not require medical   attention (report to your care team if they continue or are bothersome): Diarrhea Loss of appetite Nausea Stomach pain Vomiting This list may not describe all possible side effects. Call your doctor for medical advice about side effects. You may report side effects to FDA at 1-800-FDA-1088. Where should I keep my medication? Keep out of the reach of children. Store  unopened pens in a refrigerator between 2 and 8 degrees C (36 and 46 degrees F). Do not freeze. Protect from light and heat. After you first use the pen, it can be stored for 56 days at room temperature between 15 and 30 degrees C (59 and 86 degrees F) or in a refrigerator. Throw away your used pen after 56 days or after the expiration date, whichever comes first. Do not store your pen with the needle attached. If the needle is left on, medication may leak from the pen. NOTE: This sheet is a summary. It may not cover all possible information. If you have questions about this medicine, talk to your doctor, pharmacist, or health care provider.  2023 Elsevier/Gold Standard (2020-11-01 00:00:00)  

## 2022-03-03 NOTE — Progress Notes (Signed)
Pain in feet and legs Acid reflux. Needs letter stating that he is able to drive.
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
## 2022-03-18 ENCOUNTER — Other Ambulatory Visit: Payer: Self-pay | Admitting: Family Medicine

## 2022-03-18 ENCOUNTER — Other Ambulatory Visit: Payer: Self-pay

## 2022-03-18 DIAGNOSIS — Z794 Long term (current) use of insulin: Secondary | ICD-10-CM

## 2022-03-18 NOTE — Telephone Encounter (Signed)
Medication Refill - Medication: insulin aspart (NOVOLOG FLEXPEN) 100 UNIT/ML FlexPen (patient will run out in 8/9 in the morning) patient would like request expedited   Has the patient contacted their pharmacy? Yes.    (Agent: If yes, when and what did the pharmacy advise?)  Preferred Pharmacy (with phone number or street name):  [place] at [facility] Phone:  [phone]  Fax:  [phone]      Has the patient been seen for an appointment in the last year OR does the patient have an upcoming appointment? Yes.    Agent: Please be advised that RX refills may take up to 3 business days. We ask that you follow-up with your pharmacy.

## 2022-03-19 ENCOUNTER — Other Ambulatory Visit: Payer: Self-pay

## 2022-03-19 MED ORDER — NOVOLOG FLEXPEN 100 UNIT/ML ~~LOC~~ SOPN
PEN_INJECTOR | SUBCUTANEOUS | 3 refills | Status: DC
  Filled 2022-03-19: qty 21, 58d supply, fill #0

## 2022-03-19 NOTE — Telephone Encounter (Signed)
Requested Prescriptions  Pending Prescriptions Disp Refills  . insulin aspart (NOVOLOG FLEXPEN) 100 UNIT/ML FlexPen 9 mL 3    Sig: Inject 0-12 units into the skin three times daily with meals. Per sliding scale.     Endocrinology:  Diabetes - Insulins Failed - 03/18/2022  2:38 PM      Failed - HBA1C is between 0 and 7.9 and within 180 days    HbA1c, POC (controlled diabetic range)  Date Value Ref Range Status  03/03/2022 9.7 (A) 0.0 - 7.0 % Final         Passed - Valid encounter within last 6 months    Recent Outpatient Visits          2 weeks ago Type 2 diabetes mellitus with diabetic polyneuropathy, with long-term current use of insulin ([place])   [place], [place], MD   4 months ago Type 2 diabetes mellitus with other specified complication, with long-term current use of insulin [facility])   [place], [place], [place]   6 months ago Type 2 diabetes mellitus with other specified complication, without long-term current use of insulin [facility])   [facility], MD   7 months ago Cloudy urine   [place], Eugenio Saenz, [place]   9 months ago Type 2 diabetes mellitus with other specified complication, with long-term current use of insulin [facility])   [place], [place], MD      Future Appointments            In 2 weeks Daisy Blossom, Jarome Matin, [place]   In 2 months Charlott Rakes, MD [place]

## 2022-03-31 ENCOUNTER — Other Ambulatory Visit: Payer: Self-pay

## 2022-03-31 ENCOUNTER — Telehealth: Payer: Self-pay | Admitting: Family Medicine

## 2022-03-31 DIAGNOSIS — E1169 Type 2 diabetes mellitus with other specified complication: Secondary | ICD-10-CM

## 2022-03-31 NOTE — Telephone Encounter (Signed)
Medication Refill - Medication: insulin detemir (LEVEMIR) 100 UNIT/ML FlexPen Patient is out of this  Has the patient contacted their pharmacy? yes (Agent: If no, request that the patient contact the pharmacy for the refill. If patient does not wish to contact the pharmacy document the reason why and proceed with request.) (Agent: If yes, when and what did the pharmacy advise?)couldnt get thru, says high call volume and leave message  Preferred Pharmacy (with phone number or street name):  [place] at [facility] Phone:  [phone]  Fax:  [phone]     Has the patient been seen for an appointment in the last year OR does the patient have an upcoming appointment? yes  Agent: Please be advised that RX refills may take up to 3 business days. We ask that you follow-up with your pharmacy.

## 2022-04-01 NOTE — Telephone Encounter (Signed)
last RF 06/13/21 #30 ml 6 RF= 210 days-    Requested Prescriptions  Refused Prescriptions Disp Refills  . insulin detemir (LEVEMIR) 100 UNIT/ML FlexPen 30 mL 6    Sig: Inject 15 Units into the skin 2 (two) times daily.     Endocrinology:  Diabetes - Insulins Failed - 03/31/2022 11:19 AM      Failed - HBA1C is between 0 and 7.9 and within 180 days    HbA1c, POC (controlled diabetic range)  Date Value Ref Range Status  03/03/2022 9.7 (A) 0.0 - 7.0 % Final         Passed - Valid encounter within last 6 months    Recent Outpatient Visits          4 weeks ago Type 2 diabetes mellitus with diabetic polyneuropathy, with long-term current use of insulin ([place])   [place], [place], MD   4 months ago Type 2 diabetes mellitus with other specified complication, with long-term current use of insulin [facility])   [place], [place], [place]   6 months ago Type 2 diabetes mellitus with other specified complication, without long-term current use of insulin [facility])   [facility] at [facility], MD   7 months ago Cloudy urine   [place], [place], [place]   9 months ago Type 2 diabetes mellitus with other specified complication, with long-term current use of insulin [facility])   [place], Enobong, MD      Future Appointments            In 3 days Daisy Blossom, Jarome Matin, [place]   In 2 months Charlott Rakes, MD [place]

## 2022-04-04 ENCOUNTER — Ambulatory Visit: Payer: 59 | Attending: Family Medicine | Admitting: Pharmacist

## 2022-04-04 ENCOUNTER — Encounter: Payer: Self-pay | Admitting: Pharmacist

## 2022-04-04 ENCOUNTER — Other Ambulatory Visit: Payer: Self-pay

## 2022-04-04 DIAGNOSIS — Z794 Long term (current) use of insulin: Secondary | ICD-10-CM

## 2022-04-04 DIAGNOSIS — E1142 Type 2 diabetes mellitus with diabetic polyneuropathy: Secondary | ICD-10-CM | POA: Diagnosis not present

## 2022-04-04 MED ORDER — OZEMPIC (0.25 OR 0.5 MG/DOSE) 2 MG/3ML ~~LOC~~ SOPN
0.5000 mg | PEN_INJECTOR | SUBCUTANEOUS | 1 refills | Status: DC
  Filled 2022-04-04: qty 3, fill #0
  Filled 2022-04-15: qty 3, 28d supply, fill #0

## 2022-04-04 NOTE — Progress Notes (Signed)
    S:     No chief complaint on file.  Taylor Hughes is a 51 y.o. male who presents for diabetes evaluation, education, and management.  PMH is significant for T2DM, HTN, left fifth toe ray amputation.  Patient was referred and last seen by Primary Care Provider, Dr. Margarita Rana, on 03/03/2022.   At last visit, A1C was 9.7%, Ozempic 0.25 mg weekly was added.   Today, patient arrives in good spirits and presents without any assistance.  Patient reports experiencing hypoglycemic episodes with blood sugar as low as 56 after taking Novolog and not eating as much as he used to due to his reduced appetite.   Family/Social History:  Fhx: diabetes Tobacco: never smoker  Current diabetes medications include: Levemir 15U BID, Novolog 0-12U TID with meals per sliding scale (of note, pt uses 3-4u before meals most often), Ozempic 0.25 mg once weekly.  Current hypertension medications include: Lisinopril/HCTZ 20-25 mg once daily. Metoprolol 50 mg daily.  Current hyperlipidemia medications include: Atorvastatin 20 mg daily.  Patient reports adherence to taking all medications as prescribed.   Do you feel that your medications are working for you? yes Have you been experiencing any side effects to the medications prescribed? no Do you have any problems obtaining medications due to transportation or finances? no Insurance coverage: [employer]  Patient reports hypoglycemic events.   Reported home fasting blood sugars: 114  Reported 2 hour post-meal/random blood sugars: 210-215  Patient denies nocturia (nighttime urination).  Patient denies neuropathy (nerve pain). Patient denies visual changes. Patient reports self foot exams.   Patient reported dietary habits:  -Patient reports reduced appetite after starting Ozempic. Eats small portions of carb and a lot of veggies.   Patient-reported exercise habits:  -Patient doesn't have exercise regimen but he is active at work.  O:  Lab  Results  Component Value Date   HGBA1C 9.7 (A) 03/03/2022   There were no vitals filed for this visit.  Lipid Panel  No results found for: "CHOL", "TRIG", "HDL", "CHOLHDL", "VLDL", "LDLCALC", "LDLDIRECT"  Clinical Atherosclerotic Cardiovascular Disease (ASCVD): No  The ASCVD Risk score (Arnett DK, et al., 2019) failed to calculate for the following reasons:   Cannot find a previous HDL lab   Cannot find a previous total cholesterol lab   A/P: Diabetes longstanding currently uncontrolled. Patient is able to verbalize appropriate hypoglycemia management plan. Medication adherence appears appropriate.  -Continued basal insulin Levemir 15U twice daily. -Discontinued rapid insulin Novolog.  -Increased dose of GLP-1 Ozempic 0.25 mg to 0.5 mg weekly.  -Patient educated on purpose, proper use, and potential adverse effects of Ozempic.  -Extensively discussed pathophysiology of diabetes, recommended lifestyle interventions, dietary effects on blood sugar control.  -Counseled on s/sx of and management of hypoglycemia.  -Next A1c anticipated 05/2022.   Written patient instructions provided. Patient verbalized understanding of treatment plan.  Total time in face to face counseling 30 minutes.    Follow-up:  Pharmacist in 1 month.  Patient seen with: Deirdre Evener, PharmD Candidate [employer]  Class of 2025    Pharmacist:  Benard Halsted, PharmD, [place], [place] [phone]

## 2022-04-11 ENCOUNTER — Other Ambulatory Visit: Payer: Self-pay

## 2022-04-11 ENCOUNTER — Ambulatory Visit (INDEPENDENT_AMBULATORY_CARE_PROVIDER_SITE_OTHER): Payer: Self-pay | Admitting: Podiatry

## 2022-04-11 ENCOUNTER — Other Ambulatory Visit: Payer: Self-pay | Admitting: Family Medicine

## 2022-04-11 DIAGNOSIS — Z91199 Patient's noncompliance with other medical treatment and regimen due to unspecified reason: Secondary | ICD-10-CM

## 2022-04-11 MED ORDER — TRUE METRIX BLOOD GLUCOSE TEST VI STRP
1.0000 | ORAL_STRIP | 2 refills | Status: DC
  Filled 2022-04-11: qty 100, 25d supply, fill #0
  Filled 2022-05-12: qty 100, 25d supply, fill #1
  Filled 2022-09-02: qty 100, 25d supply, fill #2

## 2022-04-11 NOTE — Progress Notes (Signed)
No show

## 2022-04-15 ENCOUNTER — Other Ambulatory Visit: Payer: Self-pay | Admitting: Pharmacist

## 2022-04-15 ENCOUNTER — Other Ambulatory Visit: Payer: Self-pay

## 2022-04-15 MED ORDER — TRULICITY 0.75 MG/0.5ML ~~LOC~~ SOAJ
0.7500 mg | SUBCUTANEOUS | 1 refills | Status: DC
  Filled 2022-04-15: qty 2, 28d supply, fill #0
  Filled 2022-05-09: qty 2, 28d supply, fill #1

## 2022-04-16 NOTE — Telephone Encounter (Signed)
Copied from [place] [id number]. Topic: General - Other >> Apr 11, 2022  1:58 PM Leitha Schuller wrote: Reason for CRM: Pt requesting a cb to discuss if glucose blood (TRUE METRIX BLOOD GLUCOSE TEST) test strip refill can be expedited  Please fu w/pt

## 2022-04-16 NOTE — Telephone Encounter (Signed)
It looks like our pharmacy has already filled and sent these to the patient. Rx was sent 04/11/2022 and next fill date is 05/10/2022. He either picked these up or they were sent to him in the mail.

## 2022-04-19 ENCOUNTER — Encounter: Payer: Self-pay | Admitting: Family Medicine

## 2022-04-22 ENCOUNTER — Other Ambulatory Visit: Payer: Self-pay | Admitting: Family Medicine

## 2022-04-22 ENCOUNTER — Other Ambulatory Visit: Payer: Self-pay

## 2022-04-22 DIAGNOSIS — E1169 Type 2 diabetes mellitus with other specified complication: Secondary | ICD-10-CM

## 2022-04-22 MED ORDER — ATORVASTATIN CALCIUM 20 MG PO TABS
20.0000 mg | ORAL_TABLET | Freq: Every day | ORAL | 2 refills | Status: DC
  Filled 2022-04-22: qty 30, fill #0
  Filled 2022-06-23: qty 30, 30d supply, fill #0
  Filled 2022-08-21: qty 30, 30d supply, fill #1
  Filled 2022-10-27 (×2): qty 30, 30d supply, fill #2

## 2022-04-23 ENCOUNTER — Other Ambulatory Visit: Payer: Self-pay

## 2022-05-01 ENCOUNTER — Ambulatory Visit: Payer: 59 | Admitting: Family Medicine

## 2022-05-05 ENCOUNTER — Other Ambulatory Visit: Payer: Self-pay

## 2022-05-05 ENCOUNTER — Other Ambulatory Visit: Payer: Self-pay | Admitting: Pharmacist

## 2022-05-05 MED ORDER — BASAGLAR KWIKPEN 100 UNIT/ML ~~LOC~~ SOPN
30.0000 [IU] | PEN_INJECTOR | Freq: Every day | SUBCUTANEOUS | 2 refills | Status: DC
  Filled 2022-05-05: qty 9, 30d supply, fill #0
  Filled 2022-06-20: qty 9, 30d supply, fill #1
  Filled 2022-08-21: qty 9, 30d supply, fill #2

## 2022-05-09 ENCOUNTER — Other Ambulatory Visit: Payer: Self-pay

## 2022-05-09 ENCOUNTER — Ambulatory Visit: Payer: 59 | Attending: Family Medicine | Admitting: Pharmacist

## 2022-05-09 VITALS — Wt 305.0 lb

## 2022-05-09 DIAGNOSIS — Z1159 Encounter for screening for other viral diseases: Secondary | ICD-10-CM

## 2022-05-09 DIAGNOSIS — E1142 Type 2 diabetes mellitus with diabetic polyneuropathy: Secondary | ICD-10-CM

## 2022-05-09 DIAGNOSIS — Z794 Long term (current) use of insulin: Secondary | ICD-10-CM

## 2022-05-09 DIAGNOSIS — I10 Essential (primary) hypertension: Secondary | ICD-10-CM

## 2022-05-09 MED ORDER — TRULICITY 1.5 MG/0.5ML ~~LOC~~ SOAJ
1.5000 mg | SUBCUTANEOUS | 3 refills | Status: DC
  Filled 2022-05-09: qty 2, 28d supply, fill #0
  Filled 2022-06-02: qty 2, 28d supply, fill #1
  Filled 2022-07-07: qty 2, 28d supply, fill #2
  Filled 2022-08-21: qty 2, 28d supply, fill #3

## 2022-05-09 NOTE — Progress Notes (Signed)
    S:     No chief complaint on file.  Taylor Hughes is a 51 y.o. male who presents for diabetes evaluation, education, and management.  PMH is significant for T2DM, HTN, left fifth toe ray amputation.  Patient was referred and last seen by Primary Care Provider, Dr. Margarita Rana, on 03/03/2022. I saw him 04/04/2022 and increased Ozempic. Since that visit, we had to change him to Trulicity for cost purposes.   Today, patient arrives in good spirits and presents without any assistance. He is really liking the Trulicity and has lost 25 lbs since starting Trulicity last month! Denies any NV, abdominal pain. Denies any changes in vision since starting Trulicity.   Family/Social History:  Fhx: diabetes Tobacco: never smoker  Current diabetes medications include: Levemir 16X BID, Trulicity 0.96 mg weekly  Patient reports adherence to taking all medications as prescribed.   Insurance coverage: none currently  Patient reports hypoglycemic events.   Reported home fasting blood sugars: 90s - 140s.  Reported 2 hour post-meal/random blood sugars: Rare outlier >300 if he drinks fruit juices. Otherwise, he has not noticed any readings >200 since starting Trulicity.   Patient denies nocturia (nighttime urination).  Patient denies neuropathy (nerve pain). Patient denies visual changes. Patient reports self foot exams.   Patient reported dietary habits:  -Patient reports reduced appetite after starting Trulicity. Eats small portions of carb and a lot of veggies.   Patient-reported exercise habits:  -Patient doesn't have exercise regimen but he is active at work.  O:  Lab Results  Component Value Date   HGBA1C 9.7 (A) 03/03/2022   There were no vitals filed for this visit.  Lipid Panel  No results found for: "CHOL", "TRIG", "HDL", "CHOLHDL", "VLDL", "LDLCALC", "LDLDIRECT"  Clinical Atherosclerotic Cardiovascular Disease (ASCVD): No  The ASCVD Risk score (Arnett DK, et al., 2019) failed to  calculate for the following reasons:   Cannot find a previous HDL lab   Cannot find a previous total cholesterol lab   A/P: Diabetes longstanding currently uncontrolled based on A1c but reported home CBG levels reveal improvement. Patient is able to verbalize appropriate hypoglycemia management plan. Medication adherence appears appropriate.  -Continued basal insulin Levemir 15U twice daily. Plans to switch to Basaglar 30u once daily d/t loss of insurance. Recommend to decrease to 25u daily if hypoglycemia occurs after increasing Trulicity dose. -Increased dose of GLP-1 Trulicity 0.45 mg to 1.5 mg weekly. -Patient educated on purpose, proper use, and potential adverse effects of Trulicity.  -Extensively discussed pathophysiology of diabetes, recommended lifestyle interventions, dietary effects on blood sugar control.  -Counseled on s/sx of and management of hypoglycemia.  -Next A1c anticipated 05/2022.   Written patient instructions provided. Patient verbalized understanding of treatment plan.  Total time in face to face counseling 30 minutes.    Follow-up:  PCP in 1 month.  Pharmacist:  Benard Halsted, PharmD, [place], [place] [phone]

## 2022-05-10 LAB — BASIC METABOLIC PANEL
BUN/Creatinine Ratio: 29 — ABNORMAL HIGH (ref 9–20)
BUN: 30 mg/dL — ABNORMAL HIGH (ref 6–24)
CO2: 21 mmol/L (ref 20–29)
Calcium: 9.1 mg/dL (ref 8.7–10.2)
Chloride: 105 mmol/L (ref 96–106)
Creatinine, Ser: 1.04 mg/dL (ref 0.76–1.27)
Glucose: 72 mg/dL (ref 70–99)
Potassium: 4.4 mmol/L (ref 3.5–5.2)
Sodium: 141 mmol/L (ref 134–144)
eGFR: 87 mL/min/{1.73_m2} (ref >59)

## 2022-05-10 LAB — MICROALBUMIN / CREATININE URINE RATIO
Creatinine, Urine: 222.9 mg/dL
Microalb/Creat Ratio: 199 mg/g creat — ABNORMAL HIGH (ref 0–29)
Microalbumin, Urine: 443.3 ug/mL

## 2022-05-10 LAB — LP+NON-HDL CHOLESTEROL
Cholesterol, Total: 173 mg/dL (ref 100–199)
HDL: 71 mg/dL (ref >39)
LDL Chol Calc (NIH): 92 mg/dL (ref 0–99)
Total Non-HDL-Chol (LDL+VLDL): 102 mg/dL (ref 0–129)
Triglycerides: 50 mg/dL (ref 0–149)
VLDL Cholesterol Cal: 10 mg/dL (ref 5–40)

## 2022-05-10 LAB — HCV AB W REFLEX TO QUANT PCR: HCV Ab: NONREACTIVE

## 2022-05-10 LAB — HCV INTERPRETATION

## 2022-05-12 ENCOUNTER — Other Ambulatory Visit: Payer: Self-pay | Admitting: Family Medicine

## 2022-05-12 ENCOUNTER — Other Ambulatory Visit: Payer: Self-pay

## 2022-05-13 MED ORDER — GABAPENTIN 300 MG PO CAPS
300.0000 mg | ORAL_CAPSULE | Freq: Two times a day (BID) | ORAL | 6 refills | Status: DC
  Filled 2022-05-13: qty 60, 30d supply, fill #0
  Filled 2022-06-23: qty 60, 30d supply, fill #1
  Filled 2022-08-21: qty 60, 30d supply, fill #2
  Filled 2022-10-27: qty 60, 30d supply, fill #3
  Filled 2022-12-08: qty 60, 30d supply, fill #4
  Filled 2023-01-09 (×2): qty 60, 30d supply, fill #5

## 2022-05-13 NOTE — Telephone Encounter (Signed)
Requested medication (s) are due for refill today:unsure  Requested medication (s) are on the active medication list: yes  Last refill:  03/03/22  Future visit scheduled: yes  Notes to clinic:  Unable to refill per protocol, last refill by provider 03/03/22 for 60 and 4 RF. Pharmacy status: no print. Routing for review     Requested Prescriptions  Pending Prescriptions Disp Refills   gabapentin (NEURONTIN) 300 MG capsule 60 capsule 6    Sig: TAKE 1 CAPSULE (300 MG TOTAL) BY MOUTH 2 (TWO) TIMES DAILY.     Neurology: Anticonvulsants - gabapentin Passed - 05/12/2022  4:22 PM      Passed - Cr in normal range and within 360 days    Creatinine, Ser  Date Value Ref Range Status  05/09/2022 1.04 0.76 - 1.27 mg/dL Final         Passed - Completed PHQ-2 or PHQ-9 in the last 360 days      Passed - Valid encounter within last 12 months    Recent Outpatient Visits           4 days ago Type 2 diabetes mellitus with diabetic polyneuropathy, with long-term current use of insulin [facility])   Ray, Annie Main L, RPH-CPP   1 month ago Type 2 diabetes mellitus with diabetic polyneuropathy, with long-term current use of insulin ([place])   [place], Annie Main L, RPH-CPP   2 months ago Type 2 diabetes mellitus with diabetic polyneuropathy, with long-term current use of insulin ([place])   [place], [place], MD   6 months ago Type 2 diabetes mellitus with other specified complication, with long-term current use of insulin [facility])   [place], [place], [place]   7 months ago Type 2 diabetes mellitus with other specified complication, without long-term current use of insulin [facility])   Primary Care at [facility], MD       Future Appointments             In 1 month Charlott Rakes, MD [place]

## 2022-05-14 ENCOUNTER — Other Ambulatory Visit: Payer: Self-pay

## 2022-05-15 ENCOUNTER — Other Ambulatory Visit: Payer: Self-pay

## 2022-05-19 ENCOUNTER — Other Ambulatory Visit: Payer: Self-pay

## 2022-05-20 ENCOUNTER — Other Ambulatory Visit (HOSPITAL_COMMUNITY): Payer: Self-pay

## 2022-05-23 ENCOUNTER — Other Ambulatory Visit: Payer: Self-pay

## 2022-06-02 ENCOUNTER — Other Ambulatory Visit: Payer: Self-pay | Admitting: Family Medicine

## 2022-06-02 ENCOUNTER — Other Ambulatory Visit: Payer: Self-pay

## 2022-06-02 DIAGNOSIS — E1169 Type 2 diabetes mellitus with other specified complication: Secondary | ICD-10-CM

## 2022-06-02 MED ORDER — NOVOLOG FLEXPEN 100 UNIT/ML ~~LOC~~ SOPN
PEN_INJECTOR | SUBCUTANEOUS | 3 refills | Status: DC
  Filled 2022-06-02: qty 9, 25d supply, fill #0
  Filled 2022-06-23: qty 9, 25d supply, fill #1
  Filled 2022-08-21: qty 9, 25d supply, fill #2

## 2022-06-03 ENCOUNTER — Other Ambulatory Visit: Payer: Self-pay

## 2022-06-16 ENCOUNTER — Ambulatory Visit: Payer: 59 | Admitting: Family Medicine

## 2022-06-16 ENCOUNTER — Other Ambulatory Visit: Payer: Self-pay

## 2022-06-16 ENCOUNTER — Other Ambulatory Visit: Payer: Self-pay | Admitting: Family Medicine

## 2022-06-17 ENCOUNTER — Other Ambulatory Visit: Payer: Self-pay

## 2022-06-17 MED ORDER — ALBUTEROL SULFATE HFA 108 (90 BASE) MCG/ACT IN AERS
2.0000 | INHALATION_SPRAY | Freq: Four times a day (QID) | RESPIRATORY_TRACT | 0 refills | Status: DC | PRN
  Filled 2022-06-17: qty 6.7, 25d supply, fill #0

## 2022-06-17 NOTE — Telephone Encounter (Signed)
Requested Prescriptions  Pending Prescriptions Disp Refills   albuterol (VENTOLIN HFA) 108 (90 Base) MCG/ACT inhaler 18 g 0    Sig: Inhale 2 puffs into the lungs every 6 (six) hours as needed for wheezing or shortness of breath.     Pulmonology:  Beta Agonists 2 Passed - 06/16/2022 11:34 AM      Passed - Last BP in normal range    BP Readings from Last 1 Encounters:  03/03/22 133/83         Passed - Last Heart Rate in normal range    Pulse Readings from Last 1 Encounters:  03/03/22 84         Passed - Valid encounter within last 12 months    Recent Outpatient Visits           1 month ago Type 2 diabetes mellitus with diabetic polyneuropathy, with long-term current use of insulin ([place])   [place], Annie Main L, RPH-CPP   2 months ago Type 2 diabetes mellitus with diabetic polyneuropathy, with long-term current use of insulin ([place])   [place], Annie Main L, RPH-CPP   3 months ago Type 2 diabetes mellitus with diabetic polyneuropathy, with long-term current use of insulin ([place])   [place], [place], MD   7 months ago Type 2 diabetes mellitus with other specified complication, with long-term current use of insulin [facility])   [place], [place], [place]   9 months ago Type 2 diabetes mellitus with other specified complication, without long-term current use of insulin [facility])   Primary Care at [facility], MD       Future Appointments             In 2 months Charlott Rakes, MD [place]

## 2022-06-18 ENCOUNTER — Other Ambulatory Visit (HOSPITAL_COMMUNITY): Payer: Self-pay

## 2022-06-20 ENCOUNTER — Other Ambulatory Visit: Payer: Self-pay

## 2022-06-23 ENCOUNTER — Other Ambulatory Visit: Payer: Self-pay

## 2022-06-30 ENCOUNTER — Other Ambulatory Visit: Payer: Self-pay

## 2022-06-30 ENCOUNTER — Emergency Department (HOSPITAL_COMMUNITY)
Admission: EM | Admit: 2022-06-30 | Discharge: 2022-06-30 | Disposition: A | Discharge status: Home / Self Care | Payer: Self-pay | Attending: Emergency Medicine | Admitting: Emergency Medicine

## 2022-06-30 DIAGNOSIS — Z7982 Long term (current) use of aspirin: Secondary | ICD-10-CM | POA: Insufficient documentation

## 2022-06-30 DIAGNOSIS — Z794 Long term (current) use of insulin: Secondary | ICD-10-CM | POA: Diagnosis not present

## 2022-06-30 DIAGNOSIS — Z79899 Other long term (current) drug therapy: Secondary | ICD-10-CM | POA: Diagnosis not present

## 2022-06-30 DIAGNOSIS — Y92410 Unspecified street and highway as the place of occurrence of the external cause: Secondary | ICD-10-CM | POA: Diagnosis not present

## 2022-06-30 DIAGNOSIS — E162 Hypoglycemia, unspecified: Secondary | ICD-10-CM | POA: Diagnosis not present

## 2022-06-30 DIAGNOSIS — Z041 Encounter for examination and observation following transport accident: Secondary | ICD-10-CM | POA: Insufficient documentation

## 2022-06-30 LAB — BASIC METABOLIC PANEL
Anion gap: 8 (ref 5–15)
BUN: 25 mg/dL — ABNORMAL HIGH (ref 6–20)
CO2: 24 mmol/L (ref 22–32)
Calcium: 9.1 mg/dL (ref 8.9–10.3)
Chloride: 108 mmol/L (ref 98–111)
Creatinine, Ser: 1.16 mg/dL (ref 0.61–1.24)
GFR, Estimated: >60 mL/min (ref >60)
Glucose, Bld: 108 mg/dL — ABNORMAL HIGH (ref 70–99)
Potassium: 3.9 mmol/L (ref 3.5–5.1)
Sodium: 140 mmol/L (ref 135–145)

## 2022-06-30 LAB — CBC WITH DIFFERENTIAL/PLATELET
Abs Immature Granulocytes: 0.01 10*3/uL (ref 0.00–0.07)
Basophils Absolute: 0 10*3/uL (ref 0.0–0.1)
Basophils Relative: 0 %
Eosinophils Absolute: 0 10*3/uL (ref 0.0–0.5)
Eosinophils Relative: 0 %
HCT: 37.8 % — ABNORMAL LOW (ref 39.0–52.0)
Hemoglobin: 12.5 g/dL — ABNORMAL LOW (ref 13.0–17.0)
Immature Granulocytes: 0 %
Lymphocytes Relative: 20 %
Lymphs Abs: 1.2 10*3/uL (ref 0.7–4.0)
MCH: 29.1 pg (ref 26.0–34.0)
MCHC: 33.1 g/dL (ref 30.0–36.0)
MCV: 88.1 fL (ref 80.0–100.0)
Monocytes Absolute: 0.5 10*3/uL (ref 0.1–1.0)
Monocytes Relative: 9 %
Neutro Abs: 4.1 10*3/uL (ref 1.7–7.7)
Neutrophils Relative %: 71 %
Platelets: 272 10*3/uL (ref 150–400)
RBC: 4.29 MIL/uL (ref 4.22–5.81)
RDW: 15.9 % — ABNORMAL HIGH (ref 11.5–15.5)
WBC: 5.8 10*3/uL (ref 4.0–10.5)
nRBC: 0 % (ref 0.0–0.2)

## 2022-06-30 LAB — CBG MONITORING, ED: Glucose-Capillary: 92 mg/dL (ref 70–99)
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
## 2022-06-30 NOTE — ED Notes (Signed)
Discharge instructions reviewed with patient. Patient denies any questions or concerns at this time. Patient ambulatory out of ED.
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
## 2022-06-30 NOTE — Discharge Instructions (Signed)
Check your blood sugar before you take your insulin!

## 2022-07-07 ENCOUNTER — Other Ambulatory Visit: Payer: Self-pay

## 2022-07-08 ENCOUNTER — Other Ambulatory Visit: Payer: Self-pay

## 2022-07-18 ENCOUNTER — Other Ambulatory Visit: Payer: Self-pay

## 2022-07-28 ENCOUNTER — Other Ambulatory Visit: Payer: Self-pay

## 2022-08-13 ENCOUNTER — Emergency Department (HOSPITAL_COMMUNITY): Payer: Self-pay

## 2022-08-13 ENCOUNTER — Other Ambulatory Visit: Payer: Self-pay

## 2022-08-13 ENCOUNTER — Inpatient Hospital Stay (HOSPITAL_COMMUNITY)
Admission: EM | Admit: 2022-08-13 | Discharge: 2022-08-21 | DRG: 638 | Disposition: A | Discharge status: Home / Self Care | Payer: Self-pay | Attending: Internal Medicine | Admitting: Internal Medicine

## 2022-08-13 DIAGNOSIS — E876 Hypokalemia: Secondary | ICD-10-CM | POA: Diagnosis not present

## 2022-08-13 DIAGNOSIS — B370 Candidal stomatitis: Secondary | ICD-10-CM | POA: Diagnosis present

## 2022-08-13 DIAGNOSIS — I4891 Unspecified atrial fibrillation: Secondary | ICD-10-CM | POA: Diagnosis present

## 2022-08-13 DIAGNOSIS — K219 Gastro-esophageal reflux disease without esophagitis: Secondary | ICD-10-CM | POA: Diagnosis present

## 2022-08-13 DIAGNOSIS — Z6837 Body mass index (BMI) 37.0-37.9, adult: Secondary | ICD-10-CM

## 2022-08-13 DIAGNOSIS — Z91148 Patient's other noncompliance with medication regimen for other reason: Secondary | ICD-10-CM

## 2022-08-13 DIAGNOSIS — K3184 Gastroparesis: Secondary | ICD-10-CM | POA: Diagnosis present

## 2022-08-13 DIAGNOSIS — E875 Hyperkalemia: Secondary | ICD-10-CM | POA: Diagnosis present

## 2022-08-13 DIAGNOSIS — E1143 Type 2 diabetes mellitus with diabetic autonomic (poly)neuropathy: Secondary | ICD-10-CM | POA: Diagnosis present

## 2022-08-13 DIAGNOSIS — E1310 Other specified diabetes mellitus with ketoacidosis without coma: Principal | ICD-10-CM

## 2022-08-13 DIAGNOSIS — N179 Acute kidney failure, unspecified: Secondary | ICD-10-CM | POA: Diagnosis present

## 2022-08-13 DIAGNOSIS — J101 Influenza due to other identified influenza virus with other respiratory manifestations: Secondary | ICD-10-CM | POA: Diagnosis present

## 2022-08-13 DIAGNOSIS — Z833 Family history of diabetes mellitus: Secondary | ICD-10-CM

## 2022-08-13 DIAGNOSIS — E785 Hyperlipidemia, unspecified: Secondary | ICD-10-CM | POA: Diagnosis present

## 2022-08-13 DIAGNOSIS — Z1152 Encounter for screening for COVID-19: Secondary | ICD-10-CM

## 2022-08-13 DIAGNOSIS — I10 Essential (primary) hypertension: Secondary | ICD-10-CM | POA: Diagnosis present

## 2022-08-13 DIAGNOSIS — E869 Volume depletion, unspecified: Secondary | ICD-10-CM | POA: Diagnosis present

## 2022-08-13 DIAGNOSIS — Z794 Long term (current) use of insulin: Secondary | ICD-10-CM

## 2022-08-13 DIAGNOSIS — I878 Other specified disorders of veins: Secondary | ICD-10-CM | POA: Diagnosis present

## 2022-08-13 DIAGNOSIS — E1110 Type 2 diabetes mellitus with ketoacidosis without coma: Principal | ICD-10-CM | POA: Diagnosis present

## 2022-08-13 DIAGNOSIS — E669 Obesity, unspecified: Secondary | ICD-10-CM | POA: Diagnosis present

## 2022-08-13 LAB — BASIC METABOLIC PANEL
Anion gap: >20 — ABNORMAL HIGH (ref 5–15)
Anion gap: 31 — ABNORMAL HIGH (ref 5–15)
Anion gap: 25 — ABNORMAL HIGH (ref 5–15)
BUN: 52 mg/dL — ABNORMAL HIGH (ref 6–20)
BUN: 51 mg/dL — ABNORMAL HIGH (ref 6–20)
BUN: 55 mg/dL — ABNORMAL HIGH (ref 6–20)
CO2: 9 mmol/L — ABNORMAL LOW (ref 22–32)
CO2: 10 mmol/L — ABNORMAL LOW (ref 22–32)
CO2: 13 mmol/L — ABNORMAL LOW (ref 22–32)
Calcium: 8.4 mg/dL — ABNORMAL LOW (ref 8.9–10.3)
Calcium: 8.7 mg/dL — ABNORMAL LOW (ref 8.9–10.3)
Calcium: 8.8 mg/dL — ABNORMAL LOW (ref 8.9–10.3)
Chloride: 97 mmol/L — ABNORMAL LOW (ref 98–111)
Chloride: 97 mmol/L — ABNORMAL LOW (ref 98–111)
Chloride: 104 mmol/L (ref 98–111)
Creatinine, Ser: 2.97 mg/dL — ABNORMAL HIGH (ref 0.61–1.24)
Creatinine, Ser: 2.88 mg/dL — ABNORMAL HIGH (ref 0.61–1.24)
Creatinine, Ser: 2.56 mg/dL — ABNORMAL HIGH (ref 0.61–1.24)
GFR, Estimated: 25 mL/min — ABNORMAL LOW (ref >60)
GFR, Estimated: 26 mL/min — ABNORMAL LOW (ref >60)
GFR, Estimated: 30 mL/min — ABNORMAL LOW (ref >60)
Glucose, Bld: 825 mg/dL (ref 70–99)
Glucose, Bld: 785 mg/dL (ref 70–99)
Glucose, Bld: 564 mg/dL (ref 70–99)
Potassium: 5 mmol/L (ref 3.5–5.1)
Potassium: 5.8 mmol/L — ABNORMAL HIGH (ref 3.5–5.1)
Potassium: 3.8 mmol/L (ref 3.5–5.1)
Sodium: 138 mmol/L (ref 135–145)
Sodium: 138 mmol/L (ref 135–145)
Sodium: 142 mmol/L (ref 135–145)

## 2022-08-13 LAB — RESP PANEL BY RT-PCR (RSV, FLU A&B, COVID)  RVPGX2
Influenza A by PCR: POSITIVE — AB
Influenza B by PCR: NEGATIVE
Resp Syncytial Virus by PCR: NEGATIVE
SARS Coronavirus 2 by RT PCR: NEGATIVE

## 2022-08-13 LAB — CBG MONITORING, ED
Glucose-Capillary: >600 mg/dL (ref 70–99)
Glucose-Capillary: >600 mg/dL (ref 70–99)
Glucose-Capillary: >600 mg/dL (ref 70–99)
Glucose-Capillary: >600 mg/dL (ref 70–99)
Glucose-Capillary: >600 mg/dL (ref 70–99)
Glucose-Capillary: >600 mg/dL (ref 70–99)
Glucose-Capillary: >600 mg/dL (ref 70–99)
Glucose-Capillary: >600 mg/dL (ref 70–99)
Glucose-Capillary: >600 mg/dL (ref 70–99)
Glucose-Capillary: >600 mg/dL (ref 70–99)
Glucose-Capillary: >600 mg/dL (ref 70–99)
Glucose-Capillary: 562 mg/dL (ref 70–99)
Glucose-Capillary: >600 mg/dL (ref 70–99)
Glucose-Capillary: 511 mg/dL (ref 70–99)
Glucose-Capillary: 577 mg/dL (ref 70–99)
Glucose-Capillary: 461 mg/dL — ABNORMAL HIGH (ref 70–99)
Glucose-Capillary: 299 mg/dL — ABNORMAL HIGH (ref 70–99)
Glucose-Capillary: 310 mg/dL — ABNORMAL HIGH (ref 70–99)

## 2022-08-13 LAB — BLOOD GAS, VENOUS
Acid-base deficit: 25.9 mmol/L — ABNORMAL HIGH (ref 0.0–2.0)
Acid-base deficit: 25.3 mmol/L — ABNORMAL HIGH (ref 0.0–2.0)
Bicarbonate: 4.7 mmol/L — ABNORMAL LOW (ref 20.0–28.0)
Bicarbonate: 4.9 mmol/L — ABNORMAL LOW (ref 20.0–28.0)
O2 Saturation: 82.4 %
O2 Saturation: 80.6 %
Patient temperature: 37
Patient temperature: 37
pCO2, Ven: 21 mmHg — ABNORMAL LOW (ref 44–60)
pCO2, Ven: 21 mmHg — ABNORMAL LOW (ref 44–60)
pH, Ven: 6.96 — CL (ref 7.25–7.43)
pH, Ven: 6.98 — CL (ref 7.25–7.43)
pO2, Ven: 57 mmHg — ABNORMAL HIGH (ref 32–45)
pO2, Ven: 55 mmHg — ABNORMAL HIGH (ref 32–45)

## 2022-08-13 LAB — URINALYSIS, ROUTINE W REFLEX MICROSCOPIC
Bacteria, UA: NONE SEEN
Bilirubin Urine: NEGATIVE
Glucose, UA: >=500 mg/dL — AB
Ketones, ur: 20 mg/dL — AB
Leukocytes,Ua: NEGATIVE
Nitrite: NEGATIVE
Protein, ur: 100 mg/dL — AB
Specific Gravity, Urine: 1.018 (ref 1.005–1.030)
pH: 5 (ref 5.0–8.0)

## 2022-08-13 LAB — I-STAT CHEM 8, ED
BUN: 43 mg/dL — ABNORMAL HIGH (ref 6–20)
Calcium, Ion: 1.03 mmol/L — ABNORMAL LOW (ref 1.15–1.40)
Chloride: 102 mmol/L (ref 98–111)
Creatinine, Ser: 2.6 mg/dL — ABNORMAL HIGH (ref 0.61–1.24)
Glucose, Bld: >700 mg/dL (ref 70–99)
HCT: 43 % (ref 39.0–52.0)
Hemoglobin: 14.6 g/dL (ref 13.0–17.0)
Potassium: 6.4 mmol/L (ref 3.5–5.1)
Sodium: 133 mmol/L — ABNORMAL LOW (ref 135–145)
TCO2: 7 mmol/L — ABNORMAL LOW (ref 22–32)

## 2022-08-13 LAB — CBC WITH DIFFERENTIAL/PLATELET
Abs Immature Granulocytes: 0.06 10*3/uL (ref 0.00–0.07)
Abs Immature Granulocytes: 0.08 10*3/uL — ABNORMAL HIGH (ref 0.00–0.07)
Basophils Absolute: 0 10*3/uL (ref 0.0–0.1)
Basophils Absolute: 0 10*3/uL (ref 0.0–0.1)
Basophils Relative: 0 %
Basophils Relative: 0 %
Eosinophils Absolute: 0 10*3/uL (ref 0.0–0.5)
Eosinophils Absolute: 0 10*3/uL (ref 0.0–0.5)
Eosinophils Relative: 0 %
Eosinophils Relative: 0 %
HCT: 46.1 % (ref 39.0–52.0)
HCT: 38.7 % — ABNORMAL LOW (ref 39.0–52.0)
Hemoglobin: 13.3 g/dL (ref 13.0–17.0)
Hemoglobin: 11.9 g/dL — ABNORMAL LOW (ref 13.0–17.0)
Immature Granulocytes: 1 %
Immature Granulocytes: 1 %
Lymphocytes Relative: 3 %
Lymphocytes Relative: 4 %
Lymphs Abs: 0.4 10*3/uL — ABNORMAL LOW (ref 0.7–4.0)
Lymphs Abs: 0.6 10*3/uL — ABNORMAL LOW (ref 0.7–4.0)
MCH: 28.2 pg (ref 26.0–34.0)
MCH: 28.7 pg (ref 26.0–34.0)
MCHC: 28.9 g/dL — ABNORMAL LOW (ref 30.0–36.0)
MCHC: 30.7 g/dL (ref 30.0–36.0)
MCV: 97.7 fL (ref 80.0–100.0)
MCV: 93.3 fL (ref 80.0–100.0)
Monocytes Absolute: 0.8 10*3/uL (ref 0.1–1.0)
Monocytes Absolute: 1.1 10*3/uL — ABNORMAL HIGH (ref 0.1–1.0)
Monocytes Relative: 6 %
Monocytes Relative: 7 %
Neutro Abs: 11.7 10*3/uL — ABNORMAL HIGH (ref 1.7–7.7)
Neutro Abs: 13.7 10*3/uL — ABNORMAL HIGH (ref 1.7–7.7)
Neutrophils Relative %: 90 %
Neutrophils Relative %: 88 %
Platelets: 293 10*3/uL (ref 150–400)
Platelets: 275 10*3/uL (ref 150–400)
RBC: 4.72 MIL/uL (ref 4.22–5.81)
RBC: 4.15 MIL/uL — ABNORMAL LOW (ref 4.22–5.81)
RDW: 15.8 % — ABNORMAL HIGH (ref 11.5–15.5)
RDW: 15.7 % — ABNORMAL HIGH (ref 11.5–15.5)
WBC: 12.9 10*3/uL — ABNORMAL HIGH (ref 4.0–10.5)
WBC: 15.5 10*3/uL — ABNORMAL HIGH (ref 4.0–10.5)
nRBC: 0 % (ref 0.0–0.2)
nRBC: 0 % (ref 0.0–0.2)

## 2022-08-13 LAB — COMPREHENSIVE METABOLIC PANEL
ALT: 18 U/L (ref 0–44)
AST: 17 U/L (ref 15–41)
Albumin: 3.7 g/dL (ref 3.5–5.0)
Alkaline Phosphatase: 102 U/L (ref 38–126)
BUN: 46 mg/dL — ABNORMAL HIGH (ref 6–20)
CO2: <7 mmol/L — ABNORMAL LOW (ref 22–32)
Calcium: 8.9 mg/dL (ref 8.9–10.3)
Chloride: 89 mmol/L — ABNORMAL LOW (ref 98–111)
Creatinine, Ser: 2.97 mg/dL — ABNORMAL HIGH (ref 0.61–1.24)
GFR, Estimated: 25 mL/min — ABNORMAL LOW (ref >60)
Glucose, Bld: 927 mg/dL (ref 70–99)
Potassium: 5.4 mmol/L — ABNORMAL HIGH (ref 3.5–5.1)
Sodium: 134 mmol/L — ABNORMAL LOW (ref 135–145)
Total Bilirubin: 1.7 mg/dL — ABNORMAL HIGH (ref 0.3–1.2)
Total Protein: 8.6 g/dL — ABNORMAL HIGH (ref 6.5–8.1)

## 2022-08-13 LAB — BETA-HYDROXYBUTYRIC ACID
Beta-Hydroxybutyric Acid: >8 mmol/L — ABNORMAL HIGH (ref 0.05–0.27)
Beta-Hydroxybutyric Acid: >8 mmol/L — ABNORMAL HIGH (ref 0.05–0.27)
Beta-Hydroxybutyric Acid: >8 mmol/L — ABNORMAL HIGH (ref 0.05–0.27)

## 2022-08-13 LAB — HIV ANTIBODY (ROUTINE TESTING W REFLEX): HIV Screen 4th Generation wRfx: NONREACTIVE

## 2022-08-13 LAB — TROPONIN I (HIGH SENSITIVITY)
Troponin I (High Sensitivity): 9 ng/L (ref <18)
Troponin I (High Sensitivity): 13 ng/L (ref <18)

## 2022-08-13 LAB — MAGNESIUM: Magnesium: 3.2 mg/dL — ABNORMAL HIGH (ref 1.7–2.4)

## 2022-08-13 LAB — STREP PNEUMONIAE URINARY ANTIGEN: Strep Pneumo Urinary Antigen: NEGATIVE

## 2022-08-13 MED ORDER — LACTATED RINGERS IV BOLUS
1000.0000 mL | Freq: Once | INTRAVENOUS | Status: AC
  Administered 2022-08-13: 1000 mL via INTRAVENOUS

## 2022-08-13 MED ORDER — BACLOFEN 5 MG HALF TABLET
5.0000 mg | ORAL_TABLET | Freq: Once | ORAL | Status: AC
  Administered 2022-08-13: 5 mg via ORAL
  Filled 2022-08-13 (×2): qty 1

## 2022-08-13 MED ORDER — ASPIRIN 81 MG PO TBEC
81.0000 mg | DELAYED_RELEASE_TABLET | Freq: Every day | ORAL | Status: DC
  Administered 2022-08-14 – 2022-08-21 (×7): 81 mg via ORAL
  Filled 2022-08-13 (×8): qty 1

## 2022-08-13 MED ORDER — DEXTROSE 50 % IV SOLN: 0.0000 mL | INTRAVENOUS | Status: DC | PRN

## 2022-08-13 MED ORDER — ATORVASTATIN CALCIUM 10 MG PO TABS
20.0000 mg | ORAL_TABLET | Freq: Every day | ORAL | Status: DC
  Administered 2022-08-14 – 2022-08-21 (×7): 20 mg via ORAL
  Filled 2022-08-13 (×9): qty 2

## 2022-08-13 MED ORDER — OSELTAMIVIR PHOSPHATE 30 MG PO CAPS
30.0000 mg | ORAL_CAPSULE | Freq: Two times a day (BID) | ORAL | Status: DC
  Administered 2022-08-13 – 2022-08-14 (×2): 30 mg via ORAL
  Filled 2022-08-13 (×2): qty 1

## 2022-08-13 MED ORDER — OSELTAMIVIR PHOSPHATE 75 MG PO CAPS
75.0000 mg | ORAL_CAPSULE | Freq: Once | ORAL | Status: AC
  Administered 2022-08-13: 75 mg via ORAL
  Filled 2022-08-13: qty 1

## 2022-08-13 MED ORDER — INSULIN REGULAR(HUMAN) IN NACL 100-0.9 UT/100ML-% IV SOLN
INTRAVENOUS | Status: DC
  Administered 2022-08-13 (×2): 8 [IU]/h via INTRAVENOUS
  Filled 2022-08-13 (×2): qty 100

## 2022-08-13 MED ORDER — ONDANSETRON HCL 4 MG/2ML IJ SOLN: 4.0000 mg | Freq: Four times a day (QID) | INTRAMUSCULAR | Status: DC | PRN

## 2022-08-13 MED ORDER — HEPARIN SODIUM (PORCINE) 5000 UNIT/ML IJ SOLN
5000.0000 [IU] | Freq: Three times a day (TID) | INTRAMUSCULAR | Status: DC
  Administered 2022-08-13 – 2022-08-20 (×23): 5000 [IU] via SUBCUTANEOUS
  Filled 2022-08-13 (×25): qty 1

## 2022-08-13 MED ORDER — LACTATED RINGERS IV SOLN: INTRAVENOUS | Status: DC

## 2022-08-13 MED ORDER — SODIUM CHLORIDE 0.9 % IV SOLN
12.5000 mg | Freq: Four times a day (QID) | INTRAVENOUS | Status: DC | PRN
  Administered 2022-08-13 – 2022-08-18 (×7): 12.5 mg via INTRAVENOUS
  Filled 2022-08-13 (×7): qty 12.5

## 2022-08-13 MED ORDER — DEXTROSE IN LACTATED RINGERS 5 % IV SOLN
INTRAVENOUS | Status: DC
  Filled 2022-08-13: qty 1000

## 2022-08-13 MED ORDER — LACTATED RINGERS IV BOLUS
500.0000 mL | Freq: Once | INTRAVENOUS | Status: AC
  Administered 2022-08-13: 500 mL via INTRAVENOUS

## 2022-08-13 MED ORDER — POLYETHYLENE GLYCOL 3350 17 G PO PACK: 17.0000 g | PACK | Freq: Every day | ORAL | Status: DC | PRN

## 2022-08-13 MED ORDER — SODIUM CHLORIDE 0.9 % IV BOLUS
1000.0000 mL | Freq: Once | INTRAVENOUS | Status: AC
  Administered 2022-08-13: 1000 mL via INTRAVENOUS

## 2022-08-13 MED ORDER — METOPROLOL TARTRATE 50 MG PO TABS
50.0000 mg | ORAL_TABLET | Freq: Two times a day (BID) | ORAL | Status: DC
  Administered 2022-08-13 – 2022-08-21 (×16): 50 mg via ORAL
  Filled 2022-08-13: qty 1
  Filled 2022-08-13 (×3): qty 2
  Filled 2022-08-13 (×13): qty 1

## 2022-08-13 NOTE — H&P (Addendum)
NAME:  Taylor Hughes, MRN:  [id number], DOB:  1970/08/29, LOS: 0 ADMISSION DATE:  08/13/2022, CONSULTATION DATE:  08/13/22 REFERRING MD:  Dr. Ralene Bathe, CHIEF COMPLAINT:  Headache, elevated glucose   History of Present Illness:  52 y/o M who presented to [facility] ER on 1/3 with reports of headache, elevated glucose, mid back pain and breathing heavy.   The patient is known diabetic on Trulicity, Glargine & SSI at home.  He reports he has not taken his medications in two days.  On presentation to the ER, he reported SOB that began on 1/2, headache, elevated glucose, vomiting that began day prior to presentation.  Continues to work, concerned about a work note and losing his job.  Denies tobacco, ETOH/drug use.   Initial labs - Na 134, K 5.4, Cl 89, CO2 <7, glucose 927, BUN 46, Cr 2.97, Mg 3.2, WBC 12.9, Hgb 13.3, and platelets 293.  CXR was negative for acute process.  Viral screening positive for Influenza A (negative COVID, RSV). VBG notable for venous pH of 6.95.  He is on RA.  The patient was treated with 2L IVF, & insulin infusion. Tamilflu initiated.  Cultures pending.   PCCM called for evaluation.   Pertinent  Medical History  AF  DM II  GERD  Wears Glasses   Significant Hospital Events: Including procedures, antibiotic start and stop dates in addition to other pertinent events   1/3 Admit with DKA   Interim History / Subjective:  As above  Objective   Blood pressure (!) 151/76, pulse (!) 103, temperature (!) 97.4 F (36.3 C), temperature source Oral, resp. rate (!) 21, height _0  (1.803 m), weight 99.8 kg, SpO2 100 %.       No intake or output data in the 24 hours ending 08/13/22 0840 Filed Weights   08/13/22 0533  Weight: 99.8 kg    Examination: General: adult male lying in bed in NAD HENT: MM pink/dry, anicteric, pupils =/reactive  Lungs: kussmaul respirations, non-labored, able to speak / communicate, lungs bilaterally clear with good air entry  Cardiovascular: s1s2 RRR, ST on  monitor Abdomen: obese/soft, bsx4 active Extremities: warm/dry, changes consistent with chronic venous insufficiency  Neuro: drowsy, awakens to voice, speech clear, MAE / generalized weakness    Resolved Hospital Problem list      Assessment & Plan:   DKA  DM II, Poorly Controlled  Beta-hydroxy >8, in setting of influenza A / poor intake, off medications >48h.  -DKA protocol with insulin infusion, IVF's  -additional 520m LR bolus now, then 1238mhr  -repeat VBG now  -follow beta-hydroxybutyric acid -BMP Q4 to monitor for AG closure / glucose -hold home DM regimen  -note HgbA1c consistently with poor control  -hold home Trulicity, glargine, SSI for now -counseling on glucose control when more alert   Influenza A  -tamiflu, renal dosing for creatinine  -supportive care  -PRN tylenol  -CXR images reviewed, no acute process. No indication for abx at this time.   AGMA in setting of DKA  AKI due to Volume Depletion  -hold home diuretics -IVF as above  -Trend BMP / urinary output -Replace electrolytes as indicated -Avoid nephrotoxic agents, ensure adequate renal perfusion -follow AG for closure  HTN, HLD  -resume home lipitor, lopressor  -hold home lasix, lisinporil-HCTZ   Hx AF, Currently ST -tele monitoring   Best Practice (right click and "Reselect all SmartList Selections" daily)  Diet/type: NPO DVT prophylaxis: prophylactic heparin  GI prophylaxis: N/A Lines: N/A Foley:  N/A Code Status:  full code Last date of multidisciplinary goals of care discussion: patient updated on plan of care 1/3, pending family arrival.   To TR[facility] as of 1/4.   Labs   CBC: Recent Labs  Lab 08/13/22 0611 08/13/22 0700  WBC 12.9*  --   NEUTROABS 11.7*  --   HGB 13.3 14.6  HCT 46.1 43.0  MCV 97.7  --   PLT 293  --     Basic Metabolic Panel: Recent Labs  Lab 08/13/22 0611 08/13/22 0700  NA 134* 133*  K 5.4* 6.4*  CL 89* 102  CO2 <7*  --   GLUCOSE 927* >700*  BUN 46*  43*  CREATININE 2.97* 2.60*  CALCIUM 8.9  --   MG 3.2*  --    GFR: Estimated Creatinine Clearance: 40.9 mL/min (A) (by C-G formula based on SCr of 2.6 mg/dL (H)). Recent Labs  Lab 08/13/22 0611  WBC 12.9*    Liver Function Tests: Recent Labs  Lab 08/13/22 0611  AST 17  ALT 18  ALKPHOS 102  BILITOT 1.7*  PROT 8.6*  ALBUMIN 3.7   No results for input(s): "LIPASE", "AMYLASE" in the last 168 hours. No results for input(s): "AMMONIA" in the last 168 hours.  ABG    Component Value Date/Time   HCO3 4.7 (L) 08/13/2022 0611   TCO2 7 (L) 08/13/2022 0700   ACIDBASEDEF 25.9 (H) 08/13/2022 0611   O2SAT 82.4 08/13/2022 0611     Coagulation Profile: No results for input(s): "INR", "PROTIME" in the last 168 hours.  Cardiac Enzymes: No results for input(s): "CKTOTAL", "CKMB", "CKMBINDEX", "TROPONINI" in the last 168 hours.  HbA1C: Hemoglobin A1C  Date/Time Value Ref Range Status  09/19/2021 10:22 AM 8.2 (A) 4.0 - 5.6 % Final   HbA1c, POC (controlled diabetic range)  Date/Time Value Ref Range Status  03/03/2022 08:54 AM 9.7 (A) 0.0 - 7.0 % Final   Hgb A1c MFr Bld  Date/Time Value Ref Range Status  06/06/2021 03:05 AM 9.4 (H) 4.8 - 5.6 % Final    Comment:    (NOTE) Pre diabetes:          5.7%-6.4%  Diabetes:              >6.4%  Glycemic control for   <7.0% adults with diabetes   05/27/2019 12:12 PM 12.7 (H) 4.8 - 5.6 % Final    Comment:    (NOTE) Pre diabetes:          5.7%-6.4% Diabetes:              >6.4% Glycemic control for   <7.0% adults with diabetes     CBG: Recent Labs  Lab 08/13/22 0545 08/13/22 0808  GLUCAP >600* >600*    Review of Systems:   Gen: Denies fever, chills, weight change, fatigue, night sweats HEENT: Denies blurred vision, double vision, hearing loss, tinnitus, sinus congestion, rhinorrhea, sore throat, neck stiffness, dysphagia PULM: Denies shortness of breath, cough, sputum production, hemoptysis, wheezing CV: Denies chest  pain, edema, orthopnea, paroxysmal nocturnal dyspnea, palpitations GI: Denies abdominal pain, nausea, vomiting, diarrhea, hematochezia, melena, constipation, change in bowel habits GU: Denies dysuria, hematuria, polyuria, oliguria, urethral discharge Endocrine: Denies hot or cold intolerance, polyuria, polyphagia or appetite change Derm: Denies rash, dry skin, scaling or peeling skin change Heme: Denies easy bruising, bleeding, bleeding gums Neuro: Denies headache, numbness, weakness, slurred speech, loss of memory or consciousness  Past Medical History:  He,  has a past medical history of  Atrial fibrillation ([place]), Diabetes mellitus without complication ([place]), GERD (gastroesophageal reflux disease), Wears glasses, and Wound, open, foot.   Surgical History:   Past Surgical History:  Procedure Laterality Date   AMPUTATION Left 08/20/2018   Procedure: LEFT FOOT IRRIGATON AND DEBRIDEMENT, 5TH RAY AMPUTATION;  Surgeon: Newt Minion, MD;  Location: [place];  Service: Orthopedics;  Laterality: Left;   APPLICATION OF A-CELL OF CHEST/ABDOMEN N/A 05/30/2019   Procedure: Application Of A-Cell Of Chest;  Surgeon: Lajuana Matte, MD;  Location: [place];  Service: Cardiothoracic;  Laterality: N/A;   APPLICATION OF WOUND VAC N/A 05/30/2019   Procedure: Application Of Wound Vac;  Surgeon: Lajuana Matte, MD;  Location: [place] [facility];  Service: Cardiothoracic;  Laterality: N/A;   FINGER SURGERY Right 2016   I&D  small finger   I & D EXTREMITY Left 07/30/2018   Procedure: IRRIGATION AND DEBRIDEMENT LEFT FOOT WITH POSSIBLE AMPUTATION OF FIFTH TOE;  Surgeon: Mcarthur Rossetti, MD;  Location: [facility];  Service: Orthopedics;  Laterality: Left;   INCISION AND DRAINAGE ABSCESS Left 01/19/2019   Procedure: INCISION AND DRAINAGE ABSCESS upper chest;  Surgeon: Melida Quitter, MD;  Location: [place];  Service: ENT;  Laterality: Left;   IRRIGATION AND DEBRIDEMENT STERNOCLAVICULAR JOINT-STERNUM AND RIBS N/A  05/30/2019   Procedure: IRRIGATION AND DEBRIDEMENT OF STERNOCLAVICULAR JOINT-STERNUM AND RIBS ;  Surgeon: Lajuana Matte, MD;  Location: [place];  Service: Cardiothoracic;  Laterality: N/A;   MINOR IRRIGATION AND DEBRIDEMENT OF WOUND N/A 11/22/2018   Procedure: INCISION AND DRAINAGE OF NECK ABSCESS;  Surgeon: Melida Quitter, MD;  Location: [facility];  Service: ENT;  Laterality: N/A;     Social History:   reports that he has never smoked. He has never used smokeless tobacco. He reports that he does not drink alcohol and does not use drugs.   Family History:  His family history includes Diabetes in his mother.   Allergies No Known Allergies   Home Medications  Prior to Admission medications   Medication Sig Start Date End Date Taking? Authorizing Provider  acetaminophen (TYLENOL) 325 MG tablet Take 650 mg by mouth every 6 (six) hours as needed for mild pain, fever or headache.    [provider]  albuterol (VENTOLIN HFA) 108 (90 Base) MCG/ACT inhaler Inhale 2 puffs into the lungs every 6 (six) hours as needed for wheezing or shortness of breath. 06/17/22   Charlott Rakes, MD  aspirin EC 81 MG tablet Take 81 mg by mouth daily. Swallow whole.    [provider]  atorvastatin (LIPITOR) 20 MG tablet Take 1 tablet (20 mg total) by mouth daily. 04/22/22   Charlott Rakes, MD  Blood Glucose Monitoring Suppl (TRUE METRIX METER) w/Device KIT Use to check blood sugar three times daily. 04/25/21   Charlott Rakes, MD  Dulaglutide (TRULICITY) 1.5 WU/9.8JX SOPN Inject 1.5 mg into the skin once a week. 05/09/22   Charlott Rakes, MD  furosemide (LASIX) 20 MG tablet Take 1 tablet (20 mg total) by mouth daily. 03/03/22   Charlott Rakes, MD  gabapentin (NEURONTIN) 300 MG capsule Take 2 capsules (600 mg total) by mouth at bedtime. 03/03/22 03/03/23  Charlott Rakes, MD  gabapentin (NEURONTIN) 300 MG capsule Take 1 capsule (300 mg total) by mouth 2 (two) times daily. 05/13/22   Charlott Rakes, MD   glucose blood (TRUE METRIX BLOOD GLUCOSE TEST) test strip Use as directed 04/11/22   Charlott Rakes, MD  ibuprofen (ADVIL) 600 MG tablet Take 1 tablet (600  mg total) by mouth every 8 (eight) hours as needed. 12/04/21   Charlott Rakes, MD  insulin aspart (NOVOLOG FLEXPEN) 100 UNIT/ML FlexPen Inject 0-12 units into the skin three times daily with meals. Per sliding scale. 06/02/22 06/02/23  Charlott Rakes, MD  Insulin Glargine (BASAGLAR KWIKPEN) 100 UNIT/ML Inject 30 Units into the skin daily. 05/05/22   Charlott Rakes, MD  Insulin Pen Needle 32G X 5 MM MISC Use with levemir pen as prescribed 11/29/18   [place] Donnamarie Poag, DO  lisinopril-hydrochlorothiazide (ZESTORETIC) 20-25 MG tablet Take 1 tablet by mouth daily. 09/19/21   Dorna Mai, MD  methocarbamol (ROBAXIN) 500 MG tablet Take 1 to 2 tablets by mouth every 6-8 hrs as needed for muscle spasms 11/14/21   Argentina Donovan, PA-C  metoprolol tartrate (LOPRESSOR) 50 MG tablet TAKE 1 TABLET (50 MG TOTAL) BY MOUTH 2 (TWO) TIMES DAILY. 06/10/21 06/10/22  Dwyane Dee, MD  omeprazole (PRILOSEC) 40 MG capsule Take 1 capsule (40 mg total) by mouth daily. 03/03/22   Charlott Rakes, MD  TRUEplus Lancets 28G MISC Use to check blood sugar daily. 01/02/21   Charlott Rakes, MD     Critical care time: n/a     Noe Gens, MSN, APRN, NP-C, AGACNP-BC [facility] 08/13/2022, 8:40 AM   Please see [email or web address] for pager details.   From 7A-7P if no response, please call [phone] After hours, please call ELink [phone]

## 2022-08-13 NOTE — ED Triage Notes (Addendum)
Pt via POV mid back pain since earlier this morning. Pt also reports headache; pain rated 8/10. Pt states he is a diabetic and his sugar is usually high; fruity smell noted during triage assessment and pt is breathing heavily.

## 2022-08-13 NOTE — ED Provider Notes (Signed)
[place] DEPT Provider Note   CSN: [id number] Arrival date & time: 08/13/22  0456     History  Chief Complaint  Patient presents with   Back Pain    Taylor Hughes is a 52 y.o. male.  The history is provided by the patient and medical records.  Back Pain Taylor Hughes is a 52 y.o. male who presents to the Emergency Department complaining of back pain.  He presents to the emergency department for evaluation of mid back pain and shortness of breath that started yesterday.  He has associated vomiting.  No chest pain.  No fever.  He reports last dose of his medications was 2 days ago.  He has felt similar in the past with DKA.  No tobacco, alcohol, drug use.     Home Medications Prior to Admission medications   Medication Sig Start Date End Date Taking? Authorizing Provider  acetaminophen (TYLENOL) 325 MG tablet Take 650 mg by mouth every 6 (six) hours as needed for mild pain, fever or headache.    [provider]  albuterol (VENTOLIN HFA) 108 (90 Base) MCG/ACT inhaler Inhale 2 puffs into the lungs every 6 (six) hours as needed for wheezing or shortness of breath. 06/17/22   Charlott Rakes, MD  aspirin EC 81 MG tablet Take 81 mg by mouth daily. Swallow whole.    [provider]  atorvastatin (LIPITOR) 20 MG tablet Take 1 tablet (20 mg total) by mouth daily. 04/22/22   Charlott Rakes, MD  Blood Glucose Monitoring Suppl (TRUE METRIX METER) w/Device KIT Use to check blood sugar three times daily. 04/25/21   Charlott Rakes, MD  Dulaglutide (TRULICITY) 1.5 NW/2.9FA SOPN Inject 1.5 mg into the skin once a week. 05/09/22   Charlott Rakes, MD  furosemide (LASIX) 20 MG tablet Take 1 tablet (20 mg total) by mouth daily. 03/03/22   Charlott Rakes, MD  gabapentin (NEURONTIN) 300 MG capsule Take 2 capsules (600 mg total) by mouth at bedtime. 03/03/22 03/03/23  Charlott Rakes, MD  gabapentin (NEURONTIN) 300 MG capsule Take 1 capsule (300 mg total) by  mouth 2 (two) times daily. 05/13/22   Charlott Rakes, MD  glucose blood (TRUE METRIX BLOOD GLUCOSE TEST) test strip Use as directed 04/11/22   Charlott Rakes, MD  ibuprofen (ADVIL) 600 MG tablet Take 1 tablet (600 mg total) by mouth every 8 (eight) hours as needed. 12/04/21   Charlott Rakes, MD  insulin aspart (NOVOLOG FLEXPEN) 100 UNIT/ML FlexPen Inject 0-12 units into the skin three times daily with meals. Per sliding scale. 06/02/22 06/02/23  Charlott Rakes, MD  Insulin Glargine (BASAGLAR KWIKPEN) 100 UNIT/ML Inject 30 Units into the skin daily. 05/05/22   Charlott Rakes, MD  Insulin Pen Needle 32G X 5 MM MISC Use with levemir pen as prescribed 11/29/18   [place] Donnamarie Poag, DO  lisinopril-hydrochlorothiazide (ZESTORETIC) 20-25 MG tablet Take 1 tablet by mouth daily. 09/19/21   Dorna Mai, MD  methocarbamol (ROBAXIN) 500 MG tablet Take 1 to 2 tablets by mouth every 6-8 hrs as needed for muscle spasms 11/14/21   Argentina Donovan, PA-C  metoprolol tartrate (LOPRESSOR) 50 MG tablet TAKE 1 TABLET (50 MG TOTAL) BY MOUTH 2 (TWO) TIMES DAILY. 06/10/21 06/10/22  Dwyane Dee, MD  omeprazole (PRILOSEC) 40 MG capsule Take 1 capsule (40 mg total) by mouth daily. 03/03/22   Charlott Rakes, MD  TRUEplus Lancets 28G MISC Use to check blood sugar daily. 01/02/21   Charlott Rakes, MD  Allergies    Patient has no known allergies.    Review of Systems   Review of Systems  Musculoskeletal:  Positive for back pain.  All other systems reviewed and are negative.   Physical Exam Updated Vital Signs BP 100/63   Pulse (!) 103   Temp (!) 97.4 F (36.3 C) (Oral)   Resp (!) 25   Ht _0  (1.803 m)   Wt 99.8 kg   SpO2 100%   BMI 30.68 kg/m  Physical Exam Vitals and nursing note reviewed.  Constitutional:      General: He is in acute distress.     Appearance: He is well-developed. He is ill-appearing.  HENT:     Head: Normocephalic and atraumatic.  Cardiovascular:     Rate and Rhythm: Regular  rhythm. Tachycardia present.     Heart sounds: No murmur heard. Pulmonary:     Effort: No respiratory distress.     Breath sounds: Normal breath sounds.     Comments: Tachypnea Abdominal:     Palpations: Abdomen is soft.     Tenderness: There is no abdominal tenderness. There is no guarding or rebound.  Musculoskeletal:        General: No tenderness.     Comments: Chronic venous stasis changes to bilateral lower extremities.  2+ DP pulses bilaterally  Skin:    General: Skin is warm and dry.  Neurological:     Mental Status: He is alert and oriented to person, place, and time.     Comments: 5 out of 5 strength in all 4 extremities  Psychiatric:        Behavior: Behavior normal.     ED Results / Procedures / Treatments   Labs (all labs ordered are listed, but only abnormal results are displayed) Labs Reviewed  RESP PANEL BY RT-PCR (RSV, FLU A&B, COVID)  RVPGX2 - Abnormal; Notable for the following components:      Result Value   Influenza A by PCR POSITIVE (*)    All other components within normal limits  CBC WITH DIFFERENTIAL/PLATELET - Abnormal; Notable for the following components:   WBC 12.9 (*)    MCHC 28.9 (*)    RDW 15.8 (*)    Neutro Abs 11.7 (*)    Lymphs Abs 0.4 (*)    All other components within normal limits  BLOOD GAS, VENOUS - Abnormal; Notable for the following components:   pH, Ven 6.96 (*)    pCO2, Ven 21 (*)    pO2, Ven 57 (*)    Bicarbonate 4.7 (*)    Acid-base deficit 25.9 (*)    All other components within normal limits  CBG MONITORING, ED - Abnormal; Notable for the following components:   Glucose-Capillary >600 (*)    All other components within normal limits  I-STAT CHEM 8, ED - Abnormal; Notable for the following components:   Sodium 133 (*)    Potassium 6.4 (*)    BUN 43 (*)    Creatinine, Ser 2.60 (*)    Glucose, Bld >700 (*)    Calcium, Ion 1.03 (*)    TCO2 7 (*)    All other components within normal limits  COMPREHENSIVE METABOLIC  PANEL  URINALYSIS, ROUTINE W REFLEX MICROSCOPIC  BETA-HYDROXYBUTYRIC ACID  BASIC METABOLIC PANEL  BASIC METABOLIC PANEL  BASIC METABOLIC PANEL  BASIC METABOLIC PANEL  BASIC METABOLIC PANEL  BETA-HYDROXYBUTYRIC ACID  BETA-HYDROXYBUTYRIC ACID  BETA-HYDROXYBUTYRIC ACID  CBC WITH DIFFERENTIAL/PLATELET  URINALYSIS, ROUTINE W REFLEX MICROSCOPIC  BLOOD  GAS, VENOUS  MAGNESIUM  CBG MONITORING, ED  TROPONIN I (HIGH SENSITIVITY)  TROPONIN I (HIGH SENSITIVITY)    EKG EKG Interpretation  Date/Time:  Wednesday August 13 2022 05:38:13 EST Ventricular Rate:  98 PR Interval:  141 QRS Duration: 101 QT Interval:  381 QTC Calculation: 487 R Axis:   60 Text Interpretation: Sinus rhythm Low voltage, precordial leads Borderline prolonged QT interval Confirmed by [name] [phone]) on 08/13/2022 5:44:10 AM  Radiology DG Chest Port 1 View  Result Date: 08/13/2022 CLINICAL DATA:  Shortness of breath EXAM: PORTABLE CHEST 1 VIEW COMPARISON:  06/06/2021 FINDINGS: Normal heart size and mediastinal contours. No acute infiltrate or edema. No effusion or pneumothorax. No acute osseous findings. Remote posterior right rib fractures. IMPRESSION: No active disease. Electronically Signed   By: Jorje Guild M.D.   On: 08/13/2022 06:08    Procedures .Critical Care  Performed by: [name], MD Authorized by: [name], MD   Critical care provider statement:    Critical care time (minutes):  40   Critical care was necessary to treat or prevent imminent or life-threatening deterioration of the following conditions:  Renal failure, dehydration, endocrine crisis and metabolic crisis   Critical care was time spent personally by me on the following activities:  Development of treatment plan with patient or surrogate, discussions with consultants, evaluation of patient's response to treatment, examination of patient, ordering and review of laboratory studies, ordering and review of radiographic  studies, ordering and performing treatments and interventions, pulse oximetry, re-evaluation of patient's condition and review of old charts   Care discussed with: admitting provider       Medications Ordered in ED Medications  insulin regular, human (MYXREDLIN) 100 units/ 100 mL infusion (has no administration in time range)  lactated ringers infusion (has no administration in time range)  dextrose 5 % in lactated ringers infusion (has no administration in time range)  dextrose 50 % solution 0-50 mL (has no administration in time range)  sodium chloride 0.9 % bolus 1,000 mL (1,000 mLs Intravenous New Bag/Given 08/13/22 0626)  lactated ringers bolus 1,000 mL (1,000 mLs Intravenous New Bag/Given 08/13/22 6712)    ED Course/ Medical Decision Making/ A&P                           Medical Decision Making Amount and/or Complexity of Data Reviewed Labs: ordered. Radiology: ordered.   Patient here for evaluation of shortness of breath, back pain.  He has not taken his medications for 2 days.  He is ill-appearing on evaluation with tachypnea.  He is only able to provide a limited history.  He is positive for influenza, CBG is critical high.  He was treated with aggressive IV fluid hydration for presumed DKA.  VBG with pH 6.96.  Patient does appear to be improving in terms of mental status with IV fluids.  Potassium is elevated at 6.4 on i-STAT with critical high glucose and elevation in his creatinine consistent with acute kidney injury.  Will start glucose stabilizer.  Anticipate hyperkalemia to improve with IV fluid and insulin administration, will not provide additional medications for hyperkalemia at this point.  Critical care consulted for admission for ongoing care.        Final Clinical Impression(s) / ED Diagnoses Final diagnoses:  Diabetic ketoacidosis without coma associated with other specified diabetes mellitus ([place])  Hyperkalemia  Influenza A  AKI (acute kidney injury) ([place])     Rx / DC  Orders ED Discharge Orders     None         [name], MD 08/13/22 [phone]

## 2022-08-13 NOTE — ED Provider Triage Note (Signed)
Emergency Medicine Provider Triage Evaluation Note  Taylor Hughes , a 52 y.o. male  was evaluated in triage.  Pt complains of sob.  Has sob that started last night with associated mid back pain.  Review of Systems  Positive: Sob, vomiting, back pain Negative: fever  Physical Exam  Ht '5\' 11"'$  (1.803 m)   Wt 99.8 kg   BMI 30.68 kg/m  Gen:   Awake, uncomfortable appearing Resp:  tachypnea MSK:   Moves extremities without difficulty  Other:  Soft nontender abdomen  Medical Decision Making  Medically screening exam initiated at 5:40 AM.  Appropriate orders placed.  Taylor Hughes was informed that the remainder of the evaluation will be completed by another provider, this initial triage assessment does not replace that evaluation, and the importance of remaining in the ED until their evaluation is complete.     Taylor Reichert, MD 08/13/22 [phone]

## 2022-08-14 ENCOUNTER — Encounter (HOSPITAL_COMMUNITY): Payer: Self-pay | Admitting: Internal Medicine

## 2022-08-14 ENCOUNTER — Inpatient Hospital Stay (HOSPITAL_COMMUNITY): Payer: Self-pay

## 2022-08-14 DIAGNOSIS — N179 Acute kidney failure, unspecified: Secondary | ICD-10-CM

## 2022-08-14 DIAGNOSIS — J101 Influenza due to other identified influenza virus with other respiratory manifestations: Secondary | ICD-10-CM

## 2022-08-14 LAB — BASIC METABOLIC PANEL
Anion gap: 10 (ref 5–15)
Anion gap: 10 (ref 5–15)
BUN: 54 mg/dL — ABNORMAL HIGH (ref 6–20)
BUN: 51 mg/dL — ABNORMAL HIGH (ref 6–20)
CO2: 26 mmol/L (ref 22–32)
CO2: 26 mmol/L (ref 22–32)
Calcium: 9 mg/dL (ref 8.9–10.3)
Calcium: 9.2 mg/dL (ref 8.9–10.3)
Chloride: 108 mmol/L (ref 98–111)
Chloride: 110 mmol/L (ref 98–111)
Creatinine, Ser: 2.03 mg/dL — ABNORMAL HIGH (ref 0.61–1.24)
Creatinine, Ser: 1.77 mg/dL — ABNORMAL HIGH (ref 0.61–1.24)
GFR, Estimated: 39 mL/min — ABNORMAL LOW (ref >60)
GFR, Estimated: 46 mL/min — ABNORMAL LOW (ref >60)
Glucose, Bld: 236 mg/dL — ABNORMAL HIGH (ref 70–99)
Glucose, Bld: 191 mg/dL — ABNORMAL HIGH (ref 70–99)
Potassium: 3.6 mmol/L (ref 3.5–5.1)
Potassium: 3.6 mmol/L (ref 3.5–5.1)
Sodium: 144 mmol/L (ref 135–145)
Sodium: 146 mmol/L — ABNORMAL HIGH (ref 135–145)

## 2022-08-14 LAB — CBG MONITORING, ED
Glucose-Capillary: 135 mg/dL — ABNORMAL HIGH (ref 70–99)
Glucose-Capillary: 163 mg/dL — ABNORMAL HIGH (ref 70–99)
Glucose-Capillary: 180 mg/dL — ABNORMAL HIGH (ref 70–99)
Glucose-Capillary: 184 mg/dL — ABNORMAL HIGH (ref 70–99)
Glucose-Capillary: 188 mg/dL — ABNORMAL HIGH (ref 70–99)
Glucose-Capillary: 196 mg/dL — ABNORMAL HIGH (ref 70–99)
Glucose-Capillary: 199 mg/dL — ABNORMAL HIGH (ref 70–99)
Glucose-Capillary: 206 mg/dL — ABNORMAL HIGH (ref 70–99)
Glucose-Capillary: 212 mg/dL — ABNORMAL HIGH (ref 70–99)
Glucose-Capillary: 246 mg/dL — ABNORMAL HIGH (ref 70–99)

## 2022-08-14 LAB — BRAIN NATRIURETIC PEPTIDE: B Natriuretic Peptide: 112.7 pg/mL — ABNORMAL HIGH (ref 0.0–100.0)

## 2022-08-14 LAB — GLUCOSE, CAPILLARY
Glucose-Capillary: 352 mg/dL — ABNORMAL HIGH (ref 70–99)
Glucose-Capillary: 369 mg/dL — ABNORMAL HIGH (ref 70–99)
Glucose-Capillary: 371 mg/dL — ABNORMAL HIGH (ref 70–99)

## 2022-08-14 LAB — PHOSPHORUS: Phosphorus: <1 mg/dL — CL (ref 2.5–4.6)

## 2022-08-14 LAB — CBC
HCT: 37.3 % — ABNORMAL LOW (ref 39.0–52.0)
Hemoglobin: 12.5 g/dL — ABNORMAL LOW (ref 13.0–17.0)
MCH: 28.4 pg (ref 26.0–34.0)
MCHC: 33.5 g/dL (ref 30.0–36.0)
MCV: 84.8 fL (ref 80.0–100.0)
Platelets: 288 10*3/uL (ref 150–400)
RBC: 4.4 MIL/uL (ref 4.22–5.81)
RDW: 14.8 % (ref 11.5–15.5)
WBC: 9.9 10*3/uL (ref 4.0–10.5)
nRBC: 0 % (ref 0.0–0.2)

## 2022-08-14 LAB — BETA-HYDROXYBUTYRIC ACID
Beta-Hydroxybutyric Acid: 1 mmol/L — ABNORMAL HIGH (ref 0.05–0.27)
Beta-Hydroxybutyric Acid: 0.21 mmol/L (ref 0.05–0.27)

## 2022-08-14 LAB — MAGNESIUM: Magnesium: 2.6 mg/dL — ABNORMAL HIGH (ref 1.7–2.4)

## 2022-08-14 MED ORDER — INSULIN GLARGINE-YFGN 100 UNIT/ML ~~LOC~~ SOLN
20.0000 [IU] | Freq: Every day | SUBCUTANEOUS | Status: DC
  Administered 2022-08-14: 20 [IU] via SUBCUTANEOUS
  Filled 2022-08-14 (×2): qty 0.2

## 2022-08-14 MED ORDER — BACLOFEN 10 MG PO TABS
5.0000 mg | ORAL_TABLET | Freq: Once | ORAL | Status: AC
  Administered 2022-08-14: 5 mg via ORAL
  Filled 2022-08-14: qty 1

## 2022-08-14 MED ORDER — MENTHOL 3 MG MT LOZG
1.0000 | LOZENGE | OROMUCOSAL | Status: DC | PRN
  Administered 2022-08-14 – 2022-08-16 (×2): 3 mg via ORAL
  Filled 2022-08-14 (×2): qty 9

## 2022-08-14 MED ORDER — OSELTAMIVIR PHOSPHATE 75 MG PO CAPS
75.0000 mg | ORAL_CAPSULE | Freq: Two times a day (BID) | ORAL | Status: AC
  Administered 2022-08-14 – 2022-08-17 (×7): 75 mg via ORAL
  Filled 2022-08-14 (×7): qty 1

## 2022-08-14 MED ORDER — PHENOL 1.4 % MT LIQD
1.0000 | OROMUCOSAL | Status: DC | PRN
  Administered 2022-08-14: 1 via OROMUCOSAL
  Filled 2022-08-14 (×2): qty 177

## 2022-08-14 MED ORDER — K PHOS MONO-SOD PHOS DI & MONO 155-852-130 MG PO TABS
500.0000 mg | ORAL_TABLET | Freq: Three times a day (TID) | ORAL | Status: AC
  Administered 2022-08-14 (×2): 500 mg via ORAL
  Filled 2022-08-14 (×5): qty 2

## 2022-08-14 MED ORDER — LACTATED RINGERS IV SOLN: INTRAVENOUS | Status: DC

## 2022-08-14 MED ORDER — INSULIN ASPART 100 UNIT/ML IJ SOLN
0.0000 [IU] | Freq: Every day | INTRAMUSCULAR | Status: DC
  Administered 2022-08-14: 5 [IU] via SUBCUTANEOUS
  Administered 2022-08-16: 3 [IU] via SUBCUTANEOUS
  Filled 2022-08-14: qty 0.05

## 2022-08-14 MED ORDER — ONDANSETRON HCL 4 MG/2ML IJ SOLN
4.0000 mg | Freq: Three times a day (TID) | INTRAMUSCULAR | Status: DC | PRN
  Administered 2022-08-14 – 2022-08-20 (×5): 4 mg via INTRAVENOUS
  Filled 2022-08-14 (×6): qty 2

## 2022-08-14 MED ORDER — INSULIN ASPART 100 UNIT/ML IJ SOLN
0.0000 [IU] | Freq: Three times a day (TID) | INTRAMUSCULAR | Status: DC
  Administered 2022-08-14: 1 [IU] via SUBCUTANEOUS
  Administered 2022-08-14: 9 [IU] via SUBCUTANEOUS
  Filled 2022-08-14: qty 0.09

## 2022-08-14 NOTE — Inpatient Diabetes Management (Addendum)
Inpatient Diabetes Program Recommendations  AACE/ADA: New Consensus Statement on Inpatient Glycemic Control (2015)  Target Ranges:  Prepandial:   less than 140 mg/dL      Peak postprandial:   less than 180 mg/dL (1-2 hours)      Critically ill patients:  140 - 180 mg/dL   Lab Results  Component Value Date   GLUCAP 163 (H) 08/14/2022   HGBA1C 9.7 (A) 03/03/2022    Review of Glycemic Control  Diabetes history: DM2 Outpatient Diabetes medications: Levemir 30 units QD, Novolog 15 units TID, Trulicity weekly Current orders for Inpatient glycemic control: IV insulin-Transitioning to Semglee 20 units QD, Novolog 0-15 units TID and 0-5 units QHS  (+) Flu  Spoke with patient at bedside.  He confirmed above home medications.  He is still too sleepy to hold a conversation.  Will attempt to see later today or tomorrow if he is sill here.    Will continue to follow while inpatient.  Thank you, [name], MSN, [place] Diabetes Coordinator Inpatient Diabetes Program [phone] (team pager from 8a-5p)

## 2022-08-14 NOTE — Plan of Care (Signed)

## 2022-08-14 NOTE — Progress Notes (Addendum)
PROGRESS NOTE  Taylor Hughes MWU:[id number] DOB: 1971/02/19 DOA: 08/13/2022 PCP: Charlott Rakes, MD  HPI/Recap of past 24 hours: 52 y/o M who presented to [facility] ER on 1/3 with reports of headache, elevated glucose, mid back pain, SOB and vomiting X 1 day. The patient is a known diabetic on Trulicity, Glargine & SSI at home.  He reports he has not taken his medications in two days. Denies tobacco, ETOH/drug use. Initial labs - Na 134, K 5.4, Cl 89, CO2 <7, glucose 927, BUN 46, Cr 2.97, Mg 3.2, WBC 12.9, Hgb 13.3, and platelets 293.  CXR was negative for acute process.  Viral screening positive for Influenza A (negative COVID, RSV). VBG notable for venous pH of 6.95.  He is on RA. The patient was treated with 2L IVF, & insulin infusion. Tamilflu initiated.  PCCM consulted, admitted patient, stabilized patient and transferred care to [facility] on 08/14/2022.    Today, patient still reporting some nausea, generalized fatigue, noted to have hiccups.    Assessment/Plan: Active Problems:   AKI (acute kidney injury) ([place])   DKA (diabetic ketoacidosis) ([place])   Influenza A   DKA Type 2 diabetes mellitus, poorly controlled Last A1c 8.2 (09/2021), repeat pending Anion gap closed, bicarb normalized Switch to subcu insulin, but low threshold to switch back to insulin drip if oral intake continues to remain poor Continue IV fluids, Semglee, SSI, Accu-Cheks, hypoglycemic protocol  Influenza A infection Currently on room air, saturating well Chest x-ray now with cardiomegaly, no infectious process Continue Tamiflu, supportive management  AKI Creatinine 2.97 on presentation, baseline around 1.1 Likely 2/2 above Continue IV fluids Daily BMP  Hypophosphatemia Replace as needed  Hypertension/hyperlipidemia Continue home Lipitor, Lopressor Continue to hold home Lasix, lisinopril-HCTZ  Noted cardiomegaly on chest x-ray In addition to chronic venous stasis changes on BLE BNP pending Will order  echo  Obesity Lifestyle modification advised     Estimated body mass index is 30.68 kg/m as calculated from the following:   Height as of this encounter: '5\' 11"'$  (1.803 m).   Weight as of this encounter: 99.8 kg.     Code Status: Full  Family Communication: None at bedside  Disposition Plan: Status is: Inpatient Remains inpatient appropriate because: Level of care      Consultants: PCCM  Procedures: None  Antimicrobials: None  DVT prophylaxis: Heparin [place]   Objective: Vitals:   08/14/22 0634 08/14/22 0730 08/14/22 1027 08/14/22 1200  BP:  (!) 167/85  (!) 151/82  Pulse:  96  79  Resp:  13  10  Temp: 98.1 F (36.7 C)  98.1 F (36.7 C)   TempSrc: Oral  Oral   SpO2:  98%  96%  Weight:      Height:        Intake/Output Summary (Last 24 hours) at 08/14/2022 1446 Last data filed at 08/14/2022 0737 Gross per 24 hour  Intake 2706.49 ml  Output 700 ml  Net 2006.49 ml   Filed Weights   08/13/22 0533  Weight: 99.8 kg    Exam: General:NAD, noted hiccups Cardiovascular: S1, S2 present Respiratory: CTAB Abdomen: Soft, nontender, nondistended, bowel sounds present Musculoskeletal: Mild bilateral pedal edema noted, noted chronic vascular changes Skin: As above Psychiatry: Normal mood     Data Reviewed: CBC: Recent Labs  Lab 08/13/22 0611 08/13/22 0700 08/13/22 1109 08/14/22 0327  WBC 12.9*  --  15.5* 9.9  NEUTROABS 11.7*  --  13.7*  --   HGB 13.3 14.6 11.9* 12.5*  HCT  46.1 43.0 38.7* 37.3*  MCV 97.7  --  93.3 84.8  PLT 293  --  275 683   Basic Metabolic Panel: Recent Labs  Lab 08/13/22 0611 08/13/22 0700 08/13/22 1109 08/13/22 1700 08/13/22 2018 08/14/22 0054 08/14/22 0327  NA 134*   < > 138 138 142 144 146*  K 5.4*   < > 5.0 5.8* 3.8 3.6 3.6  CL 89*   < > 97* 97* 104 108 110  CO2 <7*  --  9* 10* 13* 26 26  GLUCOSE 927*   < > 825* 785* 564* 236* 191*  BUN 46*   < > 52* 51* 55* 54* 51*  CREATININE 2.97*   < > 2.97* 2.88* 2.56* 2.03*  1.77*  CALCIUM 8.9  --  8.4* 8.7* 8.8* 9.0 9.2  MG 3.2*  --   --   --   --   --  2.6*  PHOS  --   --   --   --   --   --  <1.0*   < > = values in this interval not displayed.   GFR: Estimated Creatinine Clearance: 60.1 mL/min (A) (by C-G formula based on SCr of 1.77 mg/dL (H)). Liver Function Tests: Recent Labs  Lab 08/13/22 0611  AST 17  ALT 18  ALKPHOS 102  BILITOT 1.7*  PROT 8.6*  ALBUMIN 3.7   No results for input(s): "LIPASE", "AMYLASE" in the last 168 hours. No results for input(s): "AMMONIA" in the last 168 hours. Coagulation Profile: No results for input(s): "INR", "PROTIME" in the last 168 hours. Cardiac Enzymes: No results for input(s): "CKTOTAL", "CKMB", "CKMBINDEX", "TROPONINI" in the last 168 hours. BNP (last 3 results) No results for input(s): "PROBNP" in the last 8760 hours. HbA1C: No results for input(s): "HGBA1C" in the last 72 hours. CBG: Recent Labs  Lab 08/14/22 0556 08/14/22 0739 08/14/22 0848 08/14/22 1025 08/14/22 1116  GLUCAP 196* 180* 184* 163* 135*   Lipid Profile: No results for input(s): "CHOL", "HDL", "LDLCALC", "TRIG", "CHOLHDL", "LDLDIRECT" in the last 72 hours. Thyroid Function Tests: No results for input(s): "TSH", "T4TOTAL", "FREET4", "T3FREE", "THYROIDAB" in the last 72 hours. Anemia Panel: No results for input(s): "VITAMINB12", "FOLATE", "FERRITIN", "TIBC", "IRON", "RETICCTPCT" in the last 72 hours. Urine analysis:    Component Value Date/Time   COLORURINE YELLOW 08/13/2022 1113   APPEARANCEUR HAZY (A) 08/13/2022 1113   APPEARANCEUR Clear 12/09/2021 1403   LABSPEC 1.018 08/13/2022 1113   PHURINE 5.0 08/13/2022 1113   GLUCOSEU >=500 (A) 08/13/2022 1113   HGBUR MODERATE (A) 08/13/2022 1113   BILIRUBINUR NEGATIVE 08/13/2022 1113   BILIRUBINUR Negative 12/09/2021 1403   KETONESUR 20 (A) 08/13/2022 1113   PROTEINUR 100 (A) 08/13/2022 1113   UROBILINOGEN 0.2 11/14/2021 1146   NITRITE NEGATIVE 08/13/2022 1113   LEUKOCYTESUR  NEGATIVE 08/13/2022 1113   Sepsis Labs: '@LABRCNTIP'$ (procalcitonin:4,lacticidven:4)  ) Recent Results (from the past 240 hour(s))  Resp panel by RT-PCR (RSV, Flu A&B, Covid) Anterior Nasal Swab     Status: Abnormal   Collection Time: 08/13/22  6:11 AM   Specimen: Anterior Nasal Swab  Result Value Ref Range Status   SARS Coronavirus 2 by RT PCR NEGATIVE NEGATIVE Final    Comment: (NOTE) SARS-CoV-2 target nucleic acids are NOT DETECTED.  The SARS-CoV-2 RNA is generally detectable in upper respiratory specimens during the acute phase of infection. The lowest concentration of SARS-CoV-2 viral copies this assay can detect is 138 copies/mL. A negative result does not preclude SARS-Cov-2 infection and should  not be used as the sole basis for treatment or other patient management decisions. A negative result may occur with  improper specimen collection/handling, submission of specimen other than nasopharyngeal swab, presence of viral mutation(s) within the areas targeted by this assay, and inadequate number of viral copies(<138 copies/mL). A negative result must be combined with clinical observations, patient history, and epidemiological information. The expected result is Negative.  Fact Sheet for Patients:  EntrepreneurPulse.com.au  Fact Sheet for Healthcare Providers:  IncredibleEmployment.be  This test is no t yet approved or cleared by the [place] FDA and  has been authorized for detection and/or diagnosis of SARS-CoV-2 by FDA under an Emergency Use Authorization (EUA). This EUA will remain  in effect (meaning this test can be used) for the duration of the COVID-19 declaration under Section 564(b)(1) of the Act, 21 U.S.C.section 360bbb-3(b)(1), unless the authorization is terminated  or revoked sooner.       Influenza A by PCR POSITIVE (A) NEGATIVE Final   Influenza B by PCR NEGATIVE NEGATIVE Final    Comment: (NOTE) The Xpert Xpress  SARS-CoV-2/FLU/RSV plus assay is intended as an aid in the diagnosis of influenza from Nasopharyngeal swab specimens and should not be used as a sole basis for treatment. Nasal washings and aspirates are unacceptable for Xpert Xpress SARS-CoV-2/FLU/RSV testing.  Fact Sheet for Patients: EntrepreneurPulse.com.au  Fact Sheet for Healthcare Providers: IncredibleEmployment.be  This test is not yet approved or cleared by the [place] FDA and has been authorized for detection and/or diagnosis of SARS-CoV-2 by FDA under an Emergency Use Authorization (EUA). This EUA will remain in effect (meaning this test can be used) for the duration of the COVID-19 declaration under Section 564(b)(1) of the Act, 21 U.S.C. section 360bbb-3(b)(1), unless the authorization is terminated or revoked.     Resp Syncytial Virus by PCR NEGATIVE NEGATIVE Final    Comment: (NOTE) Fact Sheet for Patients: EntrepreneurPulse.com.au  Fact Sheet for Healthcare Providers: IncredibleEmployment.be  This test is not yet approved or cleared by the [place] FDA and has been authorized for detection and/or diagnosis of SARS-CoV-2 by FDA under an Emergency Use Authorization (EUA). This EUA will remain in effect (meaning this test can be used) for the duration of the COVID-19 declaration under Section 564(b)(1) of the Act, 21 U.S.C. section 360bbb-3(b)(1), unless the authorization is terminated or revoked.  Performed at [facility], [place] [address], [place], [place] [address]       Studies: DG Chest Port 1 View  Result Date: 08/14/2022 CLINICAL DATA:  Influenza a, DKA. EXAM: PORTABLE CHEST 1 VIEW COMPARISON:  08/13/2022. FINDINGS: The heart is borderline enlarged and mediastinal contours are within normal limits. The pulmonary vasculature is prominent. Lung volumes are low. No consolidation, effusion, or pneumothorax.  No acute osseous abnormality IMPRESSION: Borderline cardiomegaly with mildly distended pulmonary vasculature. Electronically Signed   By: Brett Fairy M.D.   On: 08/14/2022 04:18    Scheduled Meds:  aspirin EC  81 mg Oral Daily   atorvastatin  20 mg Oral Daily   heparin  5,000 Units Subcutaneous Q8H   insulin aspart  0-5 Units Subcutaneous QHS   insulin aspart  0-9 Units Subcutaneous TID WC   insulin glargine-yfgn  20 Units Subcutaneous Daily   metoprolol tartrate  50 mg Oral BID   oseltamivir  75 mg Oral BID   phosphorus  500 mg Oral TID    Continuous Infusions:  dextrose 5% lactated ringers Stopped (08/14/22 1117)   promethazine (  PHENERGAN) injection (IM or IVPB) Stopped (08/14/22 1044)     LOS: 1 day     Alma Friendly, MD [facility]  If 7PM-7AM, please contact night-coverage [email or web address] 08/14/2022, 2:46 PM

## 2022-08-14 NOTE — Progress Notes (Signed)
PHARMACY NOTE:  ANTIMICROBIAL RENAL DOSAGE ADJUSTMENT  Current antimicrobial regimen includes a mismatch between antimicrobial dosage and estimated renal function.  As per policy approved by the Pharmacy & Therapeutics and Medical Executive Committees, the antimicrobial dosage will be adjusted accordingly.  Current antimicrobial dosage:  Tamiflu 30 mg BID   Indication: influenza  Renal Function:  Estimated Creatinine Clearance: 60.1 mL/min (A) (by C-G formula based on SCr of 1.77 mg/dL (H)). '[]'$      On intermittent HD, scheduled: '[]'$      On CRRT    Antimicrobial dosage has been changed to:   - Tamiflu 75 mg PO BID   Additional comments:   Thank you for allowing pharmacy to be a part of this patient's care.   Royetta Asal, PharmD, BCPS 08/14/2022 12:53 PM

## 2022-08-15 ENCOUNTER — Inpatient Hospital Stay (HOSPITAL_COMMUNITY): Payer: Self-pay

## 2022-08-15 DIAGNOSIS — I517 Cardiomegaly: Secondary | ICD-10-CM

## 2022-08-15 LAB — BASIC METABOLIC PANEL
Anion gap: 13 (ref 5–15)
Anion gap: 16 — ABNORMAL HIGH (ref 5–15)
BUN: 38 mg/dL — ABNORMAL HIGH (ref 6–20)
BUN: 37 mg/dL — ABNORMAL HIGH (ref 6–20)
CO2: 25 mmol/L (ref 22–32)
CO2: 22 mmol/L (ref 22–32)
Calcium: 8.8 mg/dL — ABNORMAL LOW (ref 8.9–10.3)
Calcium: 8.9 mg/dL (ref 8.9–10.3)
Chloride: 108 mmol/L (ref 98–111)
Chloride: 107 mmol/L (ref 98–111)
Creatinine, Ser: 1.2 mg/dL (ref 0.61–1.24)
Creatinine, Ser: 1.31 mg/dL — ABNORMAL HIGH (ref 0.61–1.24)
GFR, Estimated: >60 mL/min (ref >60)
GFR, Estimated: >60 mL/min (ref >60)
Glucose, Bld: 398 mg/dL — ABNORMAL HIGH (ref 70–99)
Glucose, Bld: 440 mg/dL — ABNORMAL HIGH (ref 70–99)
Potassium: 3.4 mmol/L — ABNORMAL LOW (ref 3.5–5.1)
Potassium: 3.6 mmol/L (ref 3.5–5.1)
Sodium: 146 mmol/L — ABNORMAL HIGH (ref 135–145)
Sodium: 145 mmol/L (ref 135–145)

## 2022-08-15 LAB — CBC WITH DIFFERENTIAL/PLATELET
Abs Immature Granulocytes: 0.03 10*3/uL (ref 0.00–0.07)
Basophils Absolute: 0 10*3/uL (ref 0.0–0.1)
Basophils Relative: 0 %
Eosinophils Absolute: 0 10*3/uL (ref 0.0–0.5)
Eosinophils Relative: 0 %
HCT: 39.8 % (ref 39.0–52.0)
Hemoglobin: 13 g/dL (ref 13.0–17.0)
Immature Granulocytes: 1 %
Lymphocytes Relative: 7 %
Lymphs Abs: 0.5 10*3/uL — ABNORMAL LOW (ref 0.7–4.0)
MCH: 28.5 pg (ref 26.0–34.0)
MCHC: 32.7 g/dL (ref 30.0–36.0)
MCV: 87.3 fL (ref 80.0–100.0)
Monocytes Absolute: 0.6 10*3/uL (ref 0.1–1.0)
Monocytes Relative: 8 %
Neutro Abs: 5.5 10*3/uL (ref 1.7–7.7)
Neutrophils Relative %: 84 %
Platelets: 273 10*3/uL (ref 150–400)
RBC: 4.56 MIL/uL (ref 4.22–5.81)
RDW: 15.1 % (ref 11.5–15.5)
WBC: 6.5 10*3/uL (ref 4.0–10.5)
nRBC: 0 % (ref 0.0–0.2)

## 2022-08-15 LAB — ECHOCARDIOGRAM COMPLETE
AR max vel: 2.76 cm2
AV Area VTI: 2.88 cm2
AV Area mean vel: 2.89 cm2
AV Mean grad: 4 mmHg
AV Peak grad: 8.1 mmHg
Ao pk vel: 1.42 m/s
Area-P 1/2: 3.48 cm2
Calc EF: 51.8 %
Height: 71 in
S' Lateral: 3.1 cm
Single Plane A2C EF: 50.8 %
Single Plane A4C EF: 51.6 %
Weight: 4215.2 oz

## 2022-08-15 LAB — GLUCOSE, CAPILLARY
Glucose-Capillary: 342 mg/dL — ABNORMAL HIGH (ref 70–99)
Glucose-Capillary: 435 mg/dL — ABNORMAL HIGH (ref 70–99)
Glucose-Capillary: 342 mg/dL — ABNORMAL HIGH (ref 70–99)
Glucose-Capillary: 249 mg/dL — ABNORMAL HIGH (ref 70–99)
Glucose-Capillary: 226 mg/dL — ABNORMAL HIGH (ref 70–99)
Glucose-Capillary: 181 mg/dL — ABNORMAL HIGH (ref 70–99)

## 2022-08-15 LAB — HEMOGLOBIN A1C
Hgb A1c MFr Bld: 11.9 % — ABNORMAL HIGH (ref 4.8–5.6)
Mean Plasma Glucose: 295 mg/dL

## 2022-08-15 LAB — PHOSPHORUS: Phosphorus: 2.4 mg/dL — ABNORMAL LOW (ref 2.5–4.6)

## 2022-08-15 MED ORDER — INSULIN GLARGINE-YFGN 100 UNIT/ML ~~LOC~~ SOLN
30.0000 [IU] | Freq: Every day | SUBCUTANEOUS | Status: DC
  Administered 2022-08-15 – 2022-08-18 (×4): 30 [IU] via SUBCUTANEOUS
  Filled 2022-08-15 (×5): qty 0.3

## 2022-08-15 MED ORDER — NYSTATIN 100000 UNIT/ML MT SUSP
5.0000 mL | Freq: Four times a day (QID) | OROMUCOSAL | Status: DC
  Administered 2022-08-15 – 2022-08-21 (×21): 500000 [IU] via ORAL
  Filled 2022-08-15 (×22): qty 5

## 2022-08-15 MED ORDER — FLUCONAZOLE IN SODIUM CHLORIDE 200-0.9 MG/100ML-% IV SOLN
200.0000 mg | INTRAVENOUS | Status: DC
  Administered 2022-08-15 – 2022-08-20 (×6): 200 mg via INTRAVENOUS
  Filled 2022-08-15 (×7): qty 100

## 2022-08-15 MED ORDER — INSULIN ASPART 100 UNIT/ML IJ SOLN
0.0000 [IU] | INTRAMUSCULAR | Status: DC
  Administered 2022-08-15: 5 [IU] via SUBCUTANEOUS
  Administered 2022-08-15: 11 [IU] via SUBCUTANEOUS
  Administered 2022-08-15 – 2022-08-16 (×3): 5 [IU] via SUBCUTANEOUS
  Administered 2022-08-16: 3 [IU] via SUBCUTANEOUS
  Administered 2022-08-16: 8 [IU] via SUBCUTANEOUS
  Administered 2022-08-16 (×2): 3 [IU] via SUBCUTANEOUS
  Administered 2022-08-17 (×2): 8 [IU] via SUBCUTANEOUS
  Administered 2022-08-17: 3 [IU] via SUBCUTANEOUS
  Administered 2022-08-17: 5 [IU] via SUBCUTANEOUS
  Administered 2022-08-17: 8 [IU] via SUBCUTANEOUS
  Administered 2022-08-18: 3 [IU] via SUBCUTANEOUS
  Administered 2022-08-18: 8 [IU] via SUBCUTANEOUS
  Administered 2022-08-18: 3 [IU] via SUBCUTANEOUS
  Administered 2022-08-18 – 2022-08-19 (×3): 5 [IU] via SUBCUTANEOUS
  Administered 2022-08-19 (×2): 8 [IU] via SUBCUTANEOUS
  Administered 2022-08-20: 5 [IU] via SUBCUTANEOUS
  Administered 2022-08-20: 2 [IU] via SUBCUTANEOUS

## 2022-08-15 MED ORDER — INSULIN ASPART 100 UNIT/ML IJ SOLN
15.0000 [IU] | Freq: Once | INTRAMUSCULAR | Status: AC
  Administered 2022-08-15: 15 [IU] via SUBCUTANEOUS

## 2022-08-15 MED ORDER — FLUTICASONE PROPIONATE 50 MCG/ACT NA SUSP
1.0000 | Freq: Every day | NASAL | Status: DC
  Administered 2022-08-15 – 2022-08-20 (×6): 1 via NASAL
  Filled 2022-08-15: qty 16

## 2022-08-15 MED ORDER — INSULIN ASPART 100 UNIT/ML IJ SOLN: 0.0000 [IU] | Freq: Three times a day (TID) | INTRAMUSCULAR | Status: DC

## 2022-08-15 MED ORDER — FLUCONAZOLE 200 MG PO TABS
200.0000 mg | ORAL_TABLET | Freq: Every day | ORAL | Status: DC
  Filled 2022-08-15: qty 1

## 2022-08-15 MED ORDER — PERFLUTREN LIPID MICROSPHERE
1.0000 mL | INTRAVENOUS | Status: AC | PRN
  Administered 2022-08-15: 2 mL via INTRAVENOUS

## 2022-08-15 MED ORDER — HYDRALAZINE HCL 20 MG/ML IJ SOLN
10.0000 mg | Freq: Once | INTRAMUSCULAR | Status: AC
  Administered 2022-08-15: 10 mg via INTRAVENOUS
  Filled 2022-08-15: qty 1

## 2022-08-15 MED ORDER — INSULIN ASPART 100 UNIT/ML IJ SOLN
3.0000 [IU] | Freq: Once | INTRAMUSCULAR | Status: AC
  Administered 2022-08-15: 3 [IU] via SUBCUTANEOUS

## 2022-08-15 MED ORDER — HYDRALAZINE HCL 20 MG/ML IJ SOLN
10.0000 mg | Freq: Three times a day (TID) | INTRAMUSCULAR | Status: DC | PRN
  Administered 2022-08-19: 10 mg via INTRAVENOUS
  Filled 2022-08-15 (×2): qty 1

## 2022-08-15 NOTE — Inpatient Diabetes Management (Incomplete Revision)
Inpatient Diabetes Program Recommendations  AACE/ADA: New Consensus Statement on Inpatient Glycemic Control (2015)  Target Ranges:  Prepandial:   less than 140 mg/dL      Peak postprandial:   less than 180 mg/dL (1-2 hours)      Critically ill patients:  140 - 180 mg/dL   Lab Results  Component Value Date   GLUCAP 435 (H) 08/15/2022   HGBA1C 9.7 (A) 03/03/2022    Review of Glycemic Control  Latest Reference Range & Units 08/14/22 08:48 08/14/22 10:25 08/14/22 11:16 08/14/22 16:44 08/14/22 20:06 08/14/22 23:36 08/15/22 04:00 08/15/22 07:49  Glucose-Capillary 70 - 99 mg/dL 184 (H) 163 (H) 135 (H) 369 (H) 371 (H) 352 (H) 342 (H) 435 (H)  (H): Data is abnormally high  Latest Reference Range & Units 08/15/22 08:32  Anion gap 5 - 15  16 (H)  (H): Data is abnormally high  Diabetes history: DM2 Outpatient Diabetes medications: Levemir 30 units QD, Novolog 15 units TID, Trulicity weekly Current orders for Inpatient glycemic control: Semglee 20 units QD, Novolog 0-15 units TID and 0-5 units QHS  Inpatient Diabetes Program Recommendations:    Met with patient at bedside.  He states it hurts to swallow and his mouth is extremely dry.  AG is 16 this morning.   Please consider:  Semglee 30 units QAM  Novolog 0-15 units Q4H until CBGs at goal    Will continue to follow while inpatient.  Thank you, [name], MSN, [place] Diabetes Coordinator Inpatient Diabetes Program [phone] (team pager from 8a-5p)

## 2022-08-15 NOTE — Progress Notes (Signed)
  Echocardiogram 2D Echocardiogram has been performed.  Taylor Hughes 08/15/2022, 1:36 PM

## 2022-08-15 NOTE — Progress Notes (Addendum)
Pharmacy Antibiotic Note  Taylor Hughes is a 52 y.o. male admitted on 08/13/2022 with DKA.  Pharmacy has been consulted for fluconazole dosing for oropharyngeal candidiasis.   Plan: Fluconazole 200 mg po qday x 7 days >> changed to IV per MD request (having difficulty swallowing due to the candidiasis)   Drug interaction with Lipitor 20 mg qday> may increase risk of rhabdo> monitor for s/sxs of muscle weakness Pharmacy to sign off  Height: '5\' 11"'$  (180.3 cm) Weight: 119.5 kg (263 lb 7.2 oz) IBW/kg (Calculated) : 75.3  Temp (24hrs), Avg:98 F (36.7 C), Min:97.5 F (36.4 C), Max:98.5 F (36.9 C)  Recent Labs  Lab 08/13/22 0611 08/13/22 0700 08/13/22 1109 08/13/22 1700 08/13/22 2018 08/14/22 0054 08/14/22 0327 08/15/22 0542 08/15/22 0832  WBC 12.9*  --  15.5*  --   --   --  9.9 6.5  --   CREATININE 2.97*   < > 2.97*   < > 2.56* 2.03* 1.77* 1.20 1.31*   < > = values in this interval not displayed.    Estimated Creatinine Clearance: 88.7 mL/min (A) (by C-G formula based on SCr of 1.31 mg/dL (H)).    No Known Allergies  Thank you for allowing pharmacy to be a part of this patient's care.  Eudelia Bunch, Pharm.D Use secure chat for questions 08/15/2022 11:24 AM

## 2022-08-15 NOTE — Inpatient Diabetes Management (Addendum)
Inpatient Diabetes Program Recommendations  AACE/ADA: New Consensus Statement on Inpatient Glycemic Control (2015)  Target Ranges:  Prepandial:   less than 140 mg/dL      Peak postprandial:   less than 180 mg/dL (1-2 hours)      Critically ill patients:  140 - 180 mg/dL   Lab Results  Component Value Date   GLUCAP 435 (H) 08/15/2022   HGBA1C 9.7 (A) 03/03/2022    Review of Glycemic Control  Latest Reference Range & Units 08/14/22 08:48 08/14/22 10:25 08/14/22 11:16 08/14/22 16:44 08/14/22 20:06 08/14/22 23:36 08/15/22 04:00 08/15/22 07:49  Glucose-Capillary 70 - 99 mg/dL 184 (H) 163 (H) 135 (H) 369 (H) 371 (H) 352 (H) 342 (H) 435 (H)  (H): Data is abnormally high  Latest Reference Range & Units 08/15/22 08:32  Anion gap 5 - 15  16 (H)  (H): Data is abnormally high  Diabetes history: DM2 Outpatient Diabetes medications: Levemir 30 units QD, Novolog 15 units TID, Trulicity weekly Current orders for Inpatient glycemic control: Semglee 20 units QD, Novolog 0-15 units TID and 0-5 units QHS  Inpatient Diabetes Program Recommendations:    Semglee 30 units QAM Novolog 5 units TID with meals  Will continue to follow while inpatient.  Thank you, [name], MSN, [place] Diabetes Coordinator Inpatient Diabetes Program [phone] (team pager from 8a-5p)

## 2022-08-15 NOTE — Progress Notes (Signed)
PROGRESS NOTE  Taylor Hughes YKD:[id number] DOB: [birth year] DOA: 08/13/2022 PCP: Charlott Rakes, MD  HPI/Recap of past 24 hours: 52 y/o M who presented to [facility] ER on 1/3 with reports of headache, elevated glucose, mid back pain, SOB and vomiting X 1 day. The patient is a known diabetic on Trulicity, Glargine & SSI at home.  He reports he has not taken his medications in two days. Denies tobacco, ETOH/drug use. Initial labs - Na 134, K 5.4, Cl 89, CO2 <7, glucose 927, BUN 46, Cr 2.97, Mg 3.2, WBC 12.9, Hgb 13.3, and platelets 293.  CXR was negative for acute process.  Viral screening positive for Influenza A (negative COVID, RSV). VBG notable for venous pH of 6.95.  He is on RA. The patient was treated with 2L IVF, & insulin infusion. Tamilflu initiated.  PCCM consulted, admitted patient, stabilized patient and transferred care to [facility] on 08/14/2022.    Today, patient continues to complain of odynophagia, noted to have oral thrush, likely the culprit.  Sugars noted to be in the 300s, still with poor oral intake likely due to oral thrush, noted some loose stools.  Still with some nausea and some vomiting.    Assessment/Plan: Active Problems:   AKI (acute kidney injury) ([place])   DKA (diabetic ketoacidosis) ([place])   Influenza A   DKA Type 2 diabetes mellitus, poorly controlled Last A1c 8.2 (09/2021), repeat pending Anion gap closed, bicarb normalized, very mild increased anion gap on 1/5 Switched to subcu insulin, but low threshold to switch back to insulin drip if oral intake continues to remain poor Continue gentle IV fluids, Semglee, SSI, Accu-Cheks, hypoglycemic protocol  Influenza A infection Currently on room air, saturating well Chest x-ray now with cardiomegaly, no infectious process Continue Tamiflu, supportive management  Oral thrush Uncontrolled diabetes Started on IV fluconazole, with nystatin swish and swallow Monitor  AKI Creatinine 2.97 on presentation, baseline around  1.1 Likely 2/2 above Continue IV fluids Daily BMP  Hypophosphatemia Replace as needed  Hypertension/hyperlipidemia Continue home Lipitor, Lopressor Continue to hold home Lasix, lisinopril-HCTZ As needed hydralazine  Noted cardiomegaly on chest x-ray In addition to chronic venous stasis changes on BLE BNP 112 Echo showed EF of 60 to 65%, with grade 1 diastolic dysfunction, no regional wall motion abnormality  Obesity Lifestyle modification advised     Estimated body mass index is 36.74 kg/m as calculated from the following:   Height as of this encounter: '5\' 11"'$  (1.803 m).   Weight as of this encounter: 119.5 kg.     Code Status: Full  Family Communication: None at bedside  Disposition Plan: Status is: Inpatient Remains inpatient appropriate because: Level of care      Consultants: PCCM  Procedures: None  Antimicrobials: None  DVT prophylaxis: Heparin [place]   Objective: Vitals:   08/14/22 2356 08/15/22 0006 08/15/22 0358 08/15/22 0500  BP: (!) 181/98 (!) 170/111 (!) 163/78   Pulse: 99 (!) 104 100   Resp: 20  19   Temp: 98.3 F (36.8 C)  98.5 F (36.9 C)   TempSrc:      SpO2: 98%  100%   Weight:    119.5 kg  Height:        Intake/Output Summary (Last 24 hours) at 08/15/2022 1738 Last data filed at 08/15/2022 1428 Gross per 24 hour  Intake 361.58 ml  Output 850 ml  Net -488.42 ml   Filed Weights   08/13/22 0533 08/15/22 0500  Weight: 99.8 kg 119.5 kg  Exam: General:NAD Cardiovascular: S1, S2 present Respiratory: CTAB Abdomen: Soft, nontender, nondistended, bowel sounds present Musculoskeletal: Mild bilateral pedal edema noted, noted chronic vascular changes Skin: As above Psychiatry: Normal mood     Data Reviewed: CBC: Recent Labs  Lab 08/13/22 0611 08/13/22 0700 08/13/22 1109 08/14/22 0327 08/15/22 0542  WBC 12.9*  --  15.5* 9.9 6.5  NEUTROABS 11.7*  --  13.7*  --  5.5  HGB 13.3 14.6 11.9* 12.5* 13.0  HCT 46.1 43.0 38.7*  37.3* 39.8  MCV 97.7  --  93.3 84.8 87.3  PLT 293  --  275 288 154   Basic Metabolic Panel: Recent Labs  Lab 08/13/22 0611 08/13/22 0700 08/13/22 2018 08/14/22 0054 08/14/22 0327 08/15/22 0542 08/15/22 0832  NA 134*   < > 142 144 146* 146* 145  K 5.4*   < > 3.8 3.6 3.6 3.4* 3.6  CL 89*   < > 104 108 110 108 107  CO2 <7*   < > 13* '26 26 25 22  '$ GLUCOSE 927*   < > 564* 236* 191* 398* 440*  BUN 46*   < > 55* 54* 51* 38* 37*  CREATININE 2.97*   < > 2.56* 2.03* 1.77* 1.20 1.31*  CALCIUM 8.9   < > 8.8* 9.0 9.2 8.8* 8.9  MG 3.2*  --   --   --  2.6*  --   --   PHOS  --   --   --   --  <1.0*  --  2.4*   < > = values in this interval not displayed.   GFR: Estimated Creatinine Clearance: 88.7 mL/min (A) (by C-G formula based on SCr of 1.31 mg/dL (H)). Liver Function Tests: Recent Labs  Lab 08/13/22 0611  AST 17  ALT 18  ALKPHOS 102  BILITOT 1.7*  PROT 8.6*  ALBUMIN 3.7   No results for input(s): "LIPASE", "AMYLASE" in the last 168 hours. No results for input(s): "AMMONIA" in the last 168 hours. Coagulation Profile: No results for input(s): "INR", "PROTIME" in the last 168 hours. Cardiac Enzymes: No results for input(s): "CKTOTAL", "CKMB", "CKMBINDEX", "TROPONINI" in the last 168 hours. BNP (last 3 results) No results for input(s): "PROBNP" in the last 8760 hours. HbA1C: No results for input(s): "HGBA1C" in the last 72 hours. CBG: Recent Labs  Lab 08/14/22 2336 08/15/22 0400 08/15/22 0749 08/15/22 1105 08/15/22 1640  GLUCAP 352* 342* 435* 342* 249*   Lipid Profile: No results for input(s): "CHOL", "HDL", "LDLCALC", "TRIG", "CHOLHDL", "LDLDIRECT" in the last 72 hours. Thyroid Function Tests: No results for input(s): "TSH", "T4TOTAL", "FREET4", "T3FREE", "THYROIDAB" in the last 72 hours. Anemia Panel: No results for input(s): "VITAMINB12", "FOLATE", "FERRITIN", "TIBC", "IRON", "RETICCTPCT" in the last 72 hours. Urine analysis:    Component Value Date/Time    COLORURINE YELLOW 08/13/2022 1113   APPEARANCEUR HAZY (A) 08/13/2022 1113   APPEARANCEUR Clear 12/09/2021 1403   LABSPEC 1.018 08/13/2022 1113   PHURINE 5.0 08/13/2022 1113   GLUCOSEU >=500 (A) 08/13/2022 1113   HGBUR MODERATE (A) 08/13/2022 1113   BILIRUBINUR NEGATIVE 08/13/2022 1113   BILIRUBINUR Negative 12/09/2021 1403   KETONESUR 20 (A) 08/13/2022 1113   PROTEINUR 100 (A) 08/13/2022 1113   UROBILINOGEN 0.2 11/14/2021 1146   NITRITE NEGATIVE 08/13/2022 1113   LEUKOCYTESUR NEGATIVE 08/13/2022 1113   Sepsis Labs: '@LABRCNTIP'$ (procalcitonin:4,lacticidven:4)  ) Recent Results (from the past 240 hour(s))  Resp panel by RT-PCR (RSV, Flu A&B, Covid) Anterior Nasal Swab     Status: Abnormal  Collection Time: 08/13/22  6:11 AM   Specimen: Anterior Nasal Swab  Result Value Ref Range Status   SARS Coronavirus 2 by RT PCR NEGATIVE NEGATIVE Final    Comment: (NOTE) SARS-CoV-2 target nucleic acids are NOT DETECTED.  The SARS-CoV-2 RNA is generally detectable in upper respiratory specimens during the acute phase of infection. The lowest concentration of SARS-CoV-2 viral copies this assay can detect is 138 copies/mL. A negative result does not preclude SARS-Cov-2 infection and should not be used as the sole basis for treatment or other patient management decisions. A negative result may occur with  improper specimen collection/handling, submission of specimen other than nasopharyngeal swab, presence of viral mutation(s) within the areas targeted by this assay, and inadequate number of viral copies(<138 copies/mL). A negative result must be combined with clinical observations, patient history, and epidemiological information. The expected result is Negative.  Fact Sheet for Patients:  EntrepreneurPulse.com.au  Fact Sheet for Healthcare Providers:  IncredibleEmployment.be  This test is no t yet approved or cleared by the [place] FDA and  has  been authorized for detection and/or diagnosis of SARS-CoV-2 by FDA under an Emergency Use Authorization (EUA). This EUA will remain  in effect (meaning this test can be used) for the duration of the COVID-19 declaration under Section 564(b)(1) of the Act, 21 U.S.C.section 360bbb-3(b)(1), unless the authorization is terminated  or revoked sooner.       Influenza A by PCR POSITIVE (A) NEGATIVE Final   Influenza B by PCR NEGATIVE NEGATIVE Final    Comment: (NOTE) The Xpert Xpress SARS-CoV-2/FLU/RSV plus assay is intended as an aid in the diagnosis of influenza from Nasopharyngeal swab specimens and should not be used as a sole basis for treatment. Nasal washings and aspirates are unacceptable for Xpert Xpress SARS-CoV-2/FLU/RSV testing.  Fact Sheet for Patients: EntrepreneurPulse.com.au  Fact Sheet for Healthcare Providers: IncredibleEmployment.be  This test is not yet approved or cleared by the [place] FDA and has been authorized for detection and/or diagnosis of SARS-CoV-2 by FDA under an Emergency Use Authorization (EUA). This EUA will remain in effect (meaning this test can be used) for the duration of the COVID-19 declaration under Section 564(b)(1) of the Act, 21 U.S.C. section 360bbb-3(b)(1), unless the authorization is terminated or revoked.     Resp Syncytial Virus by PCR NEGATIVE NEGATIVE Final    Comment: (NOTE) Fact Sheet for Patients: EntrepreneurPulse.com.au  Fact Sheet for Healthcare Providers: IncredibleEmployment.be  This test is not yet approved or cleared by the [place] FDA and has been authorized for detection and/or diagnosis of SARS-CoV-2 by FDA under an Emergency Use Authorization (EUA). This EUA will remain in effect (meaning this test can be used) for the duration of the COVID-19 declaration under Section 564(b)(1) of the Act, 21 U.S.C. section 360bbb-3(b)(1), unless  the authorization is terminated or revoked.  Performed at [facility], [place] [address] [place], [place] [address]       Studies: ECHOCARDIOGRAM COMPLETE  Result Date: 08/15/2022    ECHOCARDIOGRAM REPORT   Patient Name:   Taylor Hughes Date of Exam: 08/15/2022 Medical Rec #:  [id number]    Height:       71.0 in Accession #:    [id number]   Weight:       263.4 lb Date of Birth:  02-Nov-1970     BSA:          2.371 m Patient Age:    47 years     BP:  163/78 mmHg Patient Gender: M            HR:           86 bpm. Exam Location:  Inpatient Procedure: 2D Echo and Intracardiac Opacification Agent Indications:    Cardiomegaly  History:        Patient has prior history of Echocardiogram examinations, most                 recent 07/30/2018. Risk Factors:Hypertension and Diabetes.  Sonographer:    Harvie Junior Referring Phys: [id number] [place]  Sonographer Comments: Technically difficult study due to poor echo windows and patient is obese. Image acquisition challenging due to patient body habitus and Image acquisition challenging due to respiratory motion. IMPRESSIONS  1. Study not well visualized. Left ventricular ejection fraction, by estimation, is 60 to 65%. The left ventricle has normal function. The left ventricle has no regional wall motion abnormalities. Left ventricular diastolic parameters are consistent with Grade I diastolic dysfunction (impaired relaxation).  2. Right ventricular systolic function is normal. The right ventricular size is normal.  3. No evidence of mitral valve regurgitation.  4. Aortic valve regurgitation is not visualized.  5. The inferior vena cava is normal in size with greater than 50% respiratory variability, suggesting right atrial pressure of 3 mmHg. FINDINGS  Left Ventricle: Study not well visualized. Left ventricular ejection fraction, by estimation, is 60 to 65%. The left ventricle has normal function. The left ventricle has no regional wall  motion abnormalities. Definity contrast agent was given IV to delineate the left ventricular endocardial borders. The left ventricular internal cavity size was normal in size. There is no left ventricular hypertrophy. Left ventricular diastolic parameters are consistent with Grade I diastolic dysfunction (impaired relaxation). Right Ventricle: The right ventricular size is normal. Right ventricular systolic function is normal. Left Atrium: Left atrial size was normal in size. Right Atrium: Right atrial size was normal in size. Pericardium: There is no evidence of pericardial effusion. Mitral Valve: No evidence of mitral valve regurgitation. Tricuspid Valve: Tricuspid valve regurgitation is not demonstrated. Aortic Valve: Aortic valve regurgitation is not visualized. Aortic valve mean gradient measures 4.0 mmHg. Aortic valve peak gradient measures 8.1 mmHg. Aortic valve area, by VTI measures 2.88 cm. Pulmonic Valve: Pulmonic valve regurgitation is not visualized. Aorta: The aortic root and ascending aorta are structurally normal, with no evidence of dilitation. Venous: The inferior vena cava is normal in size with greater than 50% respiratory variability, suggesting right atrial pressure of 3 mmHg. IAS/Shunts: The interatrial septum was not well visualized.  LEFT VENTRICLE PLAX 2D LVIDd:         4.50 cm      Diastology LVIDs:         3.10 cm      LV e' medial:    7.62 cm/s LV PW:         0.90 cm      LV E/e' medial:  10.9 LV IVS:        0.90 cm      LV e' lateral:   11.90 cm/s LVOT diam:     2.20 cm      LV E/e' lateral: 6.9 LV SV:         68 LV SV Index:   29 LVOT Area:     3.80 cm  LV Volumes (MOD) LV vol d, MOD A2C: 139.5 ml LV vol d, MOD A4C: 134.5 ml LV vol s, MOD A2C: 68.7 ml LV  vol s, MOD A4C: 65.1 ml LV SV MOD A2C:     70.8 ml LV SV MOD A4C:     134.5 ml LV SV MOD BP:      72.9 ml RIGHT VENTRICLE RV Basal diam:  3.50 cm RV Mid diam:    2.80 cm RV S prime:     16.40 cm/s TAPSE (M-mode): 2.1 cm LEFT ATRIUM              Index        RIGHT ATRIUM           Index LA diam:        3.00 cm 1.27 cm/m   RA Area:     16.70 cm LA Vol (A2C):   47.0 ml 19.83 ml/m  RA Volume:   47.80 ml  20.16 ml/m LA Vol (A4C):   32.3 ml 13.63 ml/m LA Biplane Vol: 42.0 ml 17.72 ml/m  AORTIC VALVE                    PULMONIC VALVE AV Area (Vmax):    2.76 cm     PV Vmax:       1.19 m/s AV Area (Vmean):   2.89 cm     PV Peak grad:  5.7 mmHg AV Area (VTI):     2.88 cm AV Vmax:           142.00 cm/s AV Vmean:          99.300 cm/s AV VTI:            0.236 m AV Peak Grad:      8.1 mmHg AV Mean Grad:      4.0 mmHg LVOT Vmax:         103.00 cm/s LVOT Vmean:        75.400 cm/s LVOT VTI:          0.179 m LVOT/AV VTI ratio: 0.76  AORTA Ao Root diam: 3.70 cm MITRAL VALVE MV Area (PHT): 3.48 cm     SHUNTS MV Decel Time: 218 msec     Systemic VTI:  0.18 m MV E velocity: 82.70 cm/s   Systemic Diam: 2.20 cm MV A velocity: 104.00 cm/s MV E/A ratio:  0.80 Landscape architect signed by Phineas Inches Signature Date/Time: 08/15/2022/1:50:59 PM    Final     Scheduled Meds:  aspirin EC  81 mg Oral Daily   atorvastatin  20 mg Oral Daily   heparin  5,000 Units Subcutaneous Q8H   insulin aspart  0-15 Units Subcutaneous Q4H   insulin aspart  0-5 Units Subcutaneous QHS   insulin glargine-yfgn  30 Units Subcutaneous Daily   metoprolol tartrate  50 mg Oral BID   nystatin  5 mL Oral QID   oseltamivir  75 mg Oral BID    Continuous Infusions:  fluconazole (DIFLUCAN) IV 200 mg (08/15/22 1428)   lactated ringers 75 mL/hr at 08/14/22 2159   promethazine (PHENERGAN) injection (IM or IVPB) 12.5 mg (08/15/22 0627)     LOS: 2 days     Alma Friendly, MD [facility]  If 7PM-7AM, please contact night-coverage [email or web address] 08/15/2022, 5:38 PM

## 2022-08-15 NOTE — Progress Notes (Signed)
  Transition of Care [facility]) Screening Note   Patient Details  Name: Taylor Hughes Date of Birth: [birth year]   Transition of Care [facility]) CM/SW Contact:    Vassie Moselle, LCSW Phone Number: 08/15/2022, 1:24 PM    Transition of Care Department [facility]) has reviewed patient and no TOC needs have been identified at this time. We will continue to monitor patient advancement through interdisciplinary progression rounds. If new patient transition needs arise, please place a TOC consult.

## 2022-08-16 LAB — CBC WITH DIFFERENTIAL/PLATELET
Abs Immature Granulocytes: 0.01 10*3/uL (ref 0.00–0.07)
Basophils Absolute: 0 10*3/uL (ref 0.0–0.1)
Basophils Relative: 0 %
Eosinophils Absolute: 0 10*3/uL (ref 0.0–0.5)
Eosinophils Relative: 0 %
HCT: 38 % — ABNORMAL LOW (ref 39.0–52.0)
Hemoglobin: 12.5 g/dL — ABNORMAL LOW (ref 13.0–17.0)
Immature Granulocytes: 0 %
Lymphocytes Relative: 18 %
Lymphs Abs: 0.8 10*3/uL (ref 0.7–4.0)
MCH: 28.8 pg (ref 26.0–34.0)
MCHC: 32.9 g/dL (ref 30.0–36.0)
MCV: 87.6 fL (ref 80.0–100.0)
Monocytes Absolute: 0.5 10*3/uL (ref 0.1–1.0)
Monocytes Relative: 11 %
Neutro Abs: 3.4 10*3/uL (ref 1.7–7.7)
Neutrophils Relative %: 71 %
Platelets: 269 10*3/uL (ref 150–400)
RBC: 4.34 MIL/uL (ref 4.22–5.81)
RDW: 15.1 % (ref 11.5–15.5)
WBC: 4.8 10*3/uL (ref 4.0–10.5)
nRBC: 0 % (ref 0.0–0.2)

## 2022-08-16 LAB — BASIC METABOLIC PANEL
Anion gap: 8 (ref 5–15)
BUN: 23 mg/dL — ABNORMAL HIGH (ref 6–20)
CO2: 29 mmol/L (ref 22–32)
Calcium: 8.8 mg/dL — ABNORMAL LOW (ref 8.9–10.3)
Chloride: 109 mmol/L (ref 98–111)
Creatinine, Ser: 0.94 mg/dL (ref 0.61–1.24)
GFR, Estimated: >60 mL/min (ref >60)
Glucose, Bld: 185 mg/dL — ABNORMAL HIGH (ref 70–99)
Potassium: 3.1 mmol/L — ABNORMAL LOW (ref 3.5–5.1)
Sodium: 146 mmol/L — ABNORMAL HIGH (ref 135–145)

## 2022-08-16 LAB — GLUCOSE, CAPILLARY
Glucose-Capillary: 179 mg/dL — ABNORMAL HIGH (ref 70–99)
Glucose-Capillary: 197 mg/dL — ABNORMAL HIGH (ref 70–99)
Glucose-Capillary: 198 mg/dL — ABNORMAL HIGH (ref 70–99)
Glucose-Capillary: 229 mg/dL — ABNORMAL HIGH (ref 70–99)
Glucose-Capillary: 241 mg/dL — ABNORMAL HIGH (ref 70–99)
Glucose-Capillary: 256 mg/dL — ABNORMAL HIGH (ref 70–99)
Glucose-Capillary: 272 mg/dL — ABNORMAL HIGH (ref 70–99)

## 2022-08-16 MED ORDER — ALBUTEROL SULFATE (2.5 MG/3ML) 0.083% IN NEBU
2.5000 mg | INHALATION_SOLUTION | Freq: Four times a day (QID) | RESPIRATORY_TRACT | Status: DC | PRN
  Administered 2022-08-16: 2.5 mg via RESPIRATORY_TRACT
  Filled 2022-08-16: qty 3

## 2022-08-16 MED ORDER — GUAIFENESIN-DM 100-10 MG/5ML PO SYRP
10.0000 mL | ORAL_SOLUTION | ORAL | Status: DC | PRN
  Administered 2022-08-16 – 2022-08-17 (×4): 10 mL via ORAL
  Filled 2022-08-16 (×4): qty 10

## 2022-08-16 MED ORDER — LISINOPRIL 20 MG PO TABS: 20.0000 mg | ORAL_TABLET | Freq: Every day | ORAL | Status: DC

## 2022-08-16 MED ORDER — LISINOPRIL 10 MG PO TABS
10.0000 mg | ORAL_TABLET | Freq: Every day | ORAL | Status: DC
  Administered 2022-08-16 – 2022-08-21 (×5): 10 mg via ORAL
  Filled 2022-08-16 (×6): qty 1

## 2022-08-16 MED ORDER — ALBUTEROL SULFATE HFA 108 (90 BASE) MCG/ACT IN AERS: 2.0000 | INHALATION_SPRAY | Freq: Four times a day (QID) | RESPIRATORY_TRACT | Status: DC | PRN

## 2022-08-16 MED ORDER — K PHOS MONO-SOD PHOS DI & MONO 155-852-130 MG PO TABS
500.0000 mg | ORAL_TABLET | Freq: Two times a day (BID) | ORAL | Status: AC
  Administered 2022-08-16 – 2022-08-17 (×4): 500 mg via ORAL
  Filled 2022-08-16 (×5): qty 2

## 2022-08-16 MED ORDER — POTASSIUM CHLORIDE CRYS ER 20 MEQ PO TBCR: 40.0000 meq | EXTENDED_RELEASE_TABLET | Freq: Two times a day (BID) | ORAL | Status: DC

## 2022-08-16 NOTE — Progress Notes (Signed)
PROGRESS NOTE  Taylor Hughes PQZ:[id number] DOB: 1971-06-22 DOA: 08/13/2022 PCP: Charlott Rakes, MD  HPI/Recap of past 24 hours: 52 y/o M who presented to [facility] ER on 1/3 with reports of headache, elevated glucose, mid back pain, SOB and vomiting X 1 day. The patient is a known diabetic on Trulicity, Glargine & SSI at home.  He reports he has not taken his medications in two days. Denies tobacco, ETOH/drug use. Initial labs - Na 134, K 5.4, Cl 89, CO2 <7, glucose 927, BUN 46, Cr 2.97, Mg 3.2, WBC 12.9, Hgb 13.3, and platelets 293.  CXR was negative for acute process.  Viral screening positive for Influenza A (negative COVID, RSV). VBG notable for venous pH of 6.95.  He is on RA. The patient was treated with 2L IVF, & insulin infusion. Tamilflu initiated.  PCCM consulted, admitted patient, stabilized patient and transferred care to [facility] on 08/14/2022.    Today, patient reports throat pain is gradually improving, still with some nausea and vomiting, and generalized fatigue.  Overall slow improvement.    Assessment/Plan: Active Problems:   AKI (acute kidney injury) ([place])   DKA (diabetic ketoacidosis) ([facility])   Influenza A   DKA Type 2 diabetes mellitus, poorly controlled A1c 11.9 Anion gap closed, bicarb normalized, very mild increased anion gap on 1/5 Switched to subcu insulin Continue Semglee, SSI, Accu-Cheks, hypoglycemic protocol  Influenza A infection Currently on room air, saturating well Chest x-ray now with cardiomegaly, no infectious process Continue Tamiflu, supportive management  Oral thrush Likely from uncontrolled diabetes Started on IV fluconazole, with nystatin swish and swallow Monitor  AKI Improved Creatinine 2.97 on presentation, baseline around 1.1 Likely 2/2 above S/p IV fluids Daily BMP  Hypophosphatemia/hypokalemia Replace as needed  Hypertension/hyperlipidemia Continue home Lipitor, Lopressor, restart lisinopril Continue to hold home Lasix, HCTZ As  needed hydralazine  Noted cardiomegaly on chest x-ray In addition to chronic venous stasis changes on BLE BNP 112 Echo showed EF of 60 to 65%, with grade 1 diastolic dysfunction, no regional wall motion abnormality  Obesity Lifestyle modification advised     Estimated body mass index is 37.48 kg/m as calculated from the following:   Height as of this encounter: '5\' 11"'$  (1.803 m).   Weight as of this encounter: 121.9 kg.     Code Status: Full  Family Communication: None at bedside  Disposition Plan: Status is: Inpatient Remains inpatient appropriate because: Level of care      Consultants: PCCM  Procedures: None  Antimicrobials: None  DVT prophylaxis: Heparin [place]   Objective: Vitals:   08/16/22 0115 08/16/22 0421 08/16/22 0759 08/16/22 1003  BP:  (!) 182/96 (!) 165/101 (!) 148/96  Pulse:  92  82  Resp:  20  18  Temp:  99.1 F (37.3 C)  98.4 F (36.9 C)  TempSrc:  Oral  Oral  SpO2:  97% 96% 99%  Weight: 121.9 kg     Height:        Intake/Output Summary (Last 24 hours) at 08/16/2022 1454 Last data filed at 08/16/2022 1000 Gross per 24 hour  Intake 2795.33 ml  Output 750 ml  Net 2045.33 ml   Filed Weights   08/13/22 0533 08/15/22 0500 08/16/22 0115  Weight: 99.8 kg 119.5 kg 121.9 kg    Exam: General:NAD Cardiovascular: S1, S2 present Respiratory: CTAB Abdomen: Soft, nontender, nondistended, bowel sounds present Musculoskeletal: Mild bilateral pedal edema noted, noted chronic vascular changes Skin: As above Psychiatry: Normal mood     Data Reviewed:  CBC: Recent Labs  Lab 08/13/22 0611 08/13/22 0700 08/13/22 1109 08/14/22 0327 08/15/22 0542 08/16/22 0620  WBC 12.9*  --  15.5* 9.9 6.5 4.8  NEUTROABS 11.7*  --  13.7*  --  5.5 3.4  HGB 13.3 14.6 11.9* 12.5* 13.0 12.5*  HCT 46.1 43.0 38.7* 37.3* 39.8 38.0*  MCV 97.7  --  93.3 84.8 87.3 87.6  PLT 293  --  275 288 273 790   Basic Metabolic Panel: Recent Labs  Lab 08/13/22 0611  08/13/22 0700 08/14/22 0054 08/14/22 0327 08/15/22 0542 08/15/22 0832 08/16/22 0620  NA 134*   < > 144 146* 146* 145 146*  K 5.4*   < > 3.6 3.6 3.4* 3.6 3.1*  CL 89*   < > 108 110 108 107 109  CO2 <7*   < > '26 26 25 22 29  '$ GLUCOSE 927*   < > 236* 191* 398* 440* 185*  BUN 46*   < > 54* 51* 38* 37* 23*  CREATININE 2.97*   < > 2.03* 1.77* 1.20 1.31* 0.94  CALCIUM 8.9   < > 9.0 9.2 8.8* 8.9 8.8*  MG 3.2*  --   --  2.6*  --   --   --   PHOS  --   --   --  <1.0*  --  2.4*  --    < > = values in this interval not displayed.   GFR: Estimated Creatinine Clearance: 124.9 mL/min (by C-G formula based on SCr of 0.94 mg/dL). Liver Function Tests: Recent Labs  Lab 08/13/22 0611  AST 17  ALT 18  ALKPHOS 102  BILITOT 1.7*  PROT 8.6*  ALBUMIN 3.7   No results for input(s): "LIPASE", "AMYLASE" in the last 168 hours. No results for input(s): "AMMONIA" in the last 168 hours. Coagulation Profile: No results for input(s): "INR", "PROTIME" in the last 168 hours. Cardiac Enzymes: No results for input(s): "CKTOTAL", "CKMB", "CKMBINDEX", "TROPONINI" in the last 168 hours. BNP (last 3 results) No results for input(s): "PROBNP" in the last 8760 hours. HbA1C: Recent Labs    08/14/22 0827  HGBA1C 11.9*   CBG: Recent Labs  Lab 08/15/22 2215 08/16/22 0038 08/16/22 0417 08/16/22 0728 08/16/22 1132  GLUCAP 181* 229* 179* 198* 272*   Lipid Profile: No results for input(s): "CHOL", "HDL", "LDLCALC", "TRIG", "CHOLHDL", "LDLDIRECT" in the last 72 hours. Thyroid Function Tests: No results for input(s): "TSH", "T4TOTAL", "FREET4", "T3FREE", "THYROIDAB" in the last 72 hours. Anemia Panel: No results for input(s): "VITAMINB12", "FOLATE", "FERRITIN", "TIBC", "IRON", "RETICCTPCT" in the last 72 hours. Urine analysis:    Component Value Date/Time   COLORURINE YELLOW 08/13/2022 1113   APPEARANCEUR HAZY (A) 08/13/2022 1113   APPEARANCEUR Clear 12/09/2021 1403   LABSPEC 1.018 08/13/2022 1113    PHURINE 5.0 08/13/2022 1113   GLUCOSEU >=500 (A) 08/13/2022 1113   HGBUR MODERATE (A) 08/13/2022 1113   BILIRUBINUR NEGATIVE 08/13/2022 1113   BILIRUBINUR Negative 12/09/2021 1403   KETONESUR 20 (A) 08/13/2022 1113   PROTEINUR 100 (A) 08/13/2022 1113   UROBILINOGEN 0.2 11/14/2021 1146   NITRITE NEGATIVE 08/13/2022 1113   LEUKOCYTESUR NEGATIVE 08/13/2022 1113   Sepsis Labs: '@LABRCNTIP'$ (procalcitonin:4,lacticidven:4)  ) Recent Results (from the past 240 hour(s))  Resp panel by RT-PCR (RSV, Flu A&B, Covid) Anterior Nasal Swab     Status: Abnormal   Collection Time: 08/13/22  6:11 AM   Specimen: Anterior Nasal Swab  Result Value Ref Range Status   SARS Coronavirus 2 by RT PCR NEGATIVE  NEGATIVE Final    Comment: (NOTE) SARS-CoV-2 target nucleic acids are NOT DETECTED.  The SARS-CoV-2 RNA is generally detectable in upper respiratory specimens during the acute phase of infection. The lowest concentration of SARS-CoV-2 viral copies this assay can detect is 138 copies/mL. A negative result does not preclude SARS-Cov-2 infection and should not be used as the sole basis for treatment or other patient management decisions. A negative result may occur with  improper specimen collection/handling, submission of specimen other than nasopharyngeal swab, presence of viral mutation(s) within the areas targeted by this assay, and inadequate number of viral copies(<138 copies/mL). A negative result must be combined with clinical observations, patient history, and epidemiological information. The expected result is Negative.  Fact Sheet for Patients:  EntrepreneurPulse.com.au  Fact Sheet for Healthcare Providers:  IncredibleEmployment.be  This test is no t yet approved or cleared by the [place] FDA and  has been authorized for detection and/or diagnosis of SARS-CoV-2 by FDA under an Emergency Use Authorization (EUA). This EUA will remain  in effect  (meaning this test can be used) for the duration of the COVID-19 declaration under Section 564(b)(1) of the Act, 21 U.S.C.section 360bbb-3(b)(1), unless the authorization is terminated  or revoked sooner.       Influenza A by PCR POSITIVE (A) NEGATIVE Final   Influenza B by PCR NEGATIVE NEGATIVE Final    Comment: (NOTE) The Xpert Xpress SARS-CoV-2/FLU/RSV plus assay is intended as an aid in the diagnosis of influenza from Nasopharyngeal swab specimens and should not be used as a sole basis for treatment. Nasal washings and aspirates are unacceptable for Xpert Xpress SARS-CoV-2/FLU/RSV testing.  Fact Sheet for Patients: EntrepreneurPulse.com.au  Fact Sheet for Healthcare Providers: IncredibleEmployment.be  This test is not yet approved or cleared by the [place] FDA and has been authorized for detection and/or diagnosis of SARS-CoV-2 by FDA under an Emergency Use Authorization (EUA). This EUA will remain in effect (meaning this test can be used) for the duration of the COVID-19 declaration under Section 564(b)(1) of the Act, 21 U.S.C. section 360bbb-3(b)(1), unless the authorization is terminated or revoked.     Resp Syncytial Virus by PCR NEGATIVE NEGATIVE Final    Comment: (NOTE) Fact Sheet for Patients: EntrepreneurPulse.com.au  Fact Sheet for Healthcare Providers: IncredibleEmployment.be  This test is not yet approved or cleared by the [place] FDA and has been authorized for detection and/or diagnosis of SARS-CoV-2 by FDA under an Emergency Use Authorization (EUA). This EUA will remain in effect (meaning this test can be used) for the duration of the COVID-19 declaration under Section 564(b)(1) of the Act, 21 U.S.C. section 360bbb-3(b)(1), unless the authorization is terminated or revoked.  Performed at [facility], [place] [address] [place], [place] [address]        Studies: No results found.  Scheduled Meds:  aspirin EC  81 mg Oral Daily   atorvastatin  20 mg Oral Daily   fluticasone  1 spray Each Nare Daily   heparin  5,000 Units Subcutaneous Q8H   insulin aspart  0-15 Units Subcutaneous Q4H   insulin aspart  0-5 Units Subcutaneous QHS   insulin glargine-yfgn  30 Units Subcutaneous Daily   metoprolol tartrate  50 mg Oral BID   nystatin  5 mL Oral QID   oseltamivir  75 mg Oral BID    Continuous Infusions:  fluconazole (DIFLUCAN) IV 200 mg (08/16/22 1152)   promethazine (PHENERGAN) injection (IM or IVPB) 12.5 mg (08/15/22 2952)  LOS: 3 days     Alma Friendly, MD [facility]  If 7PM-7AM, please contact night-coverage [email or web address] 08/16/2022, 2:54 PM

## 2022-08-17 LAB — BASIC METABOLIC PANEL
Anion gap: 12 (ref 5–15)
BUN: 20 mg/dL (ref 6–20)
CO2: 26 mmol/L (ref 22–32)
Calcium: 8.3 mg/dL — ABNORMAL LOW (ref 8.9–10.3)
Chloride: 103 mmol/L (ref 98–111)
Creatinine, Ser: 1 mg/dL (ref 0.61–1.24)
GFR, Estimated: >60 mL/min (ref >60)
Glucose, Bld: 223 mg/dL — ABNORMAL HIGH (ref 70–99)
Potassium: 2.7 mmol/L — CL (ref 3.5–5.1)
Sodium: 141 mmol/L (ref 135–145)

## 2022-08-17 LAB — CBC WITH DIFFERENTIAL/PLATELET
Abs Immature Granulocytes: 0.07 10*3/uL (ref 0.00–0.07)
Basophils Absolute: 0 10*3/uL (ref 0.0–0.1)
Basophils Relative: 0 %
Eosinophils Absolute: 0 10*3/uL (ref 0.0–0.5)
Eosinophils Relative: 0 %
HCT: 37.9 % — ABNORMAL LOW (ref 39.0–52.0)
Hemoglobin: 12.3 g/dL — ABNORMAL LOW (ref 13.0–17.0)
Immature Granulocytes: 2 %
Lymphocytes Relative: 13 %
Lymphs Abs: 0.5 10*3/uL — ABNORMAL LOW (ref 0.7–4.0)
MCH: 28.5 pg (ref 26.0–34.0)
MCHC: 32.5 g/dL (ref 30.0–36.0)
MCV: 87.7 fL (ref 80.0–100.0)
Monocytes Absolute: 0.5 10*3/uL (ref 0.1–1.0)
Monocytes Relative: 11 %
Neutro Abs: 3.1 10*3/uL (ref 1.7–7.7)
Neutrophils Relative %: 74 %
Platelets: 273 10*3/uL (ref 150–400)
RBC: 4.32 MIL/uL (ref 4.22–5.81)
RDW: 14.9 % (ref 11.5–15.5)
WBC: 4.2 10*3/uL (ref 4.0–10.5)
nRBC: 0 % (ref 0.0–0.2)

## 2022-08-17 LAB — MAGNESIUM: Magnesium: 1.9 mg/dL (ref 1.7–2.4)

## 2022-08-17 LAB — GLUCOSE, CAPILLARY
Glucose-Capillary: 184 mg/dL — ABNORMAL HIGH (ref 70–99)
Glucose-Capillary: 187 mg/dL — ABNORMAL HIGH (ref 70–99)
Glucose-Capillary: 231 mg/dL — ABNORMAL HIGH (ref 70–99)
Glucose-Capillary: 260 mg/dL — ABNORMAL HIGH (ref 70–99)
Glucose-Capillary: 268 mg/dL — ABNORMAL HIGH (ref 70–99)
Glucose-Capillary: 269 mg/dL — ABNORMAL HIGH (ref 70–99)

## 2022-08-17 LAB — PHOSPHORUS: Phosphorus: 3.6 mg/dL (ref 2.5–4.6)

## 2022-08-17 MED ORDER — POTASSIUM CHLORIDE 20 MEQ PO PACK: 40.0000 meq | PACK | Freq: Once | ORAL | Status: DC

## 2022-08-17 MED ORDER — MAGIC MOUTHWASH W/LIDOCAINE
10.0000 mL | Freq: Three times a day (TID) | ORAL | Status: DC
  Administered 2022-08-17 – 2022-08-21 (×11): 10 mL via ORAL
  Filled 2022-08-17 (×14): qty 10

## 2022-08-17 MED ORDER — ACETAMINOPHEN 325 MG PO TABS
650.0000 mg | ORAL_TABLET | Freq: Four times a day (QID) | ORAL | Status: DC | PRN
  Administered 2022-08-19: 650 mg via ORAL
  Filled 2022-08-17: qty 2

## 2022-08-17 MED ORDER — SUCRALFATE 1 GM/10ML PO SUSP
1.0000 g | Freq: Three times a day (TID) | ORAL | Status: DC
  Administered 2022-08-17 – 2022-08-21 (×14): 1 g via ORAL
  Filled 2022-08-17 (×14): qty 10

## 2022-08-17 MED ORDER — PANTOPRAZOLE SODIUM 40 MG IV SOLR
40.0000 mg | INTRAVENOUS | Status: DC
  Administered 2022-08-17 – 2022-08-21 (×5): 40 mg via INTRAVENOUS
  Filled 2022-08-17 (×5): qty 10

## 2022-08-17 MED ORDER — POTASSIUM CHLORIDE 20 MEQ PO PACK
40.0000 meq | PACK | Freq: Two times a day (BID) | ORAL | Status: AC
  Administered 2022-08-17 (×2): 40 meq via ORAL
  Filled 2022-08-17 (×3): qty 2

## 2022-08-17 NOTE — Progress Notes (Signed)
PROGRESS NOTE  Taylor Hughes EUM:[id number] DOB: Apr 08, 1971 DOA: 08/13/2022 PCP: Charlott Rakes, MD  HPI/Recap of past 24 hours: 52 y/o M who presented to [facility] ER on 1/3 with reports of headache, elevated glucose, mid back pain, SOB and vomiting X 1 day. The patient is a known diabetic on Trulicity, Glargine & SSI at home.  He reports he has not taken his medications in two days. Denies tobacco, ETOH/drug use. Initial labs - Na 134, K 5.4, Cl 89, CO2 <7, glucose 927, BUN 46, Cr 2.97, Mg 3.2, WBC 12.9, Hgb 13.3, and platelets 293.  CXR was negative for acute process.  Viral screening positive for Influenza A (negative COVID, RSV). VBG notable for venous pH of 6.95.  He is on RA. The patient was treated with 2L IVF, & insulin infusion. Tamilflu initiated.  PCCM consulted, admitted patient, stabilized patient and transferred care to [facility] on 08/14/2022.    Today, pt still reporting pain in mouth and throat, doesn't feel great overall. Request  chaplain to speak with. Denies any SI/HI    Assessment/Plan: Active Problems:   AKI (acute kidney injury) ([place])   DKA (diabetic ketoacidosis) ([place])   Influenza A   DKA Type 2 diabetes mellitus, poorly controlled A1c 11.9 Anion gap closed, bicarb normalized, very mild increased anion gap on 1/5 Switched to subcu insulin Continue Semglee, SSI, Accu-Cheks, hypoglycemic protocol  Influenza A infection Currently on room air, saturating well Chest x-ray now with cardiomegaly, no infectious process Continue Tamiflu, supportive management  Oral thrush Likely from uncontrolled diabetes Started on IV fluconazole, with nystatin swish and swallow Start magic mouthwash, pantoprazole, carafate Monitor  AKI Improved Creatinine 2.97 on presentation, baseline around 1.1 Likely 2/2 above S/p IV fluids Daily BMP  Hypophosphatemia/hypokalemia Replace as needed  Hypertension/hyperlipidemia Continue home Lipitor, Lopressor, restart lisinopril Continue to  hold home Lasix, HCTZ As needed hydralazine  Noted cardiomegaly on chest x-ray In addition to chronic venous stasis changes on BLE BNP 112 Echo showed EF of 60 to 65%, with grade 1 diastolic dysfunction, no regional wall motion abnormality  Obesity Lifestyle modification advised     Estimated body mass index is 37.45 kg/m as calculated from the following:   Height as of this encounter: '5\' 11"'$  (1.803 m).   Weight as of this encounter: 121.8 kg.     Code Status: Full  Family Communication: None at bedside  Disposition Plan: Status is: Inpatient Remains inpatient appropriate because: Level of care      Consultants: PCCM  Procedures: None  Antimicrobials: None  DVT prophylaxis: Heparin [place]   Objective: Vitals:   08/17/22 0451 08/17/22 0954 08/17/22 1400 08/17/22 1421  BP:  138/89  118/89  Pulse:  80  96  Resp:    20  Temp:    100 F (37.8 C)  TempSrc:      SpO2:   98% 97%  Weight: 121.8 kg     Height:        Intake/Output Summary (Last 24 hours) at 08/17/2022 1436 Last data filed at 08/17/2022 0500 Gross per 24 hour  Intake 834 ml  Output --  Net 834 ml   Filed Weights   08/15/22 0500 08/16/22 0115 08/17/22 0451  Weight: 119.5 kg 121.9 kg 121.8 kg    Exam: General:NAD Cardiovascular: S1, S2 present Respiratory: CTAB Abdomen: Soft, nontender, nondistended, bowel sounds present Musculoskeletal: Lymphedema, noted chronic vascular changes Skin: As above Psychiatry: Normal mood     Data Reviewed: CBC: Recent Labs  Lab  08/13/22 4315 08/13/22 0700 08/13/22 1109 08/14/22 0327 08/15/22 0542 08/16/22 0620 08/17/22 0551  WBC 12.9*  --  15.5* 9.9 6.5 4.8 4.2  NEUTROABS 11.7*  --  13.7*  --  5.5 3.4 3.1  HGB 13.3   < > 11.9* 12.5* 13.0 12.5* 12.3*  HCT 46.1   < > 38.7* 37.3* 39.8 38.0* 37.9*  MCV 97.7  --  93.3 84.8 87.3 87.6 87.7  PLT 293  --  275 288 273 269 273   < > = values in this interval not displayed.   Basic Metabolic  Panel: Recent Labs  Lab 08/13/22 0611 08/13/22 0700 08/14/22 0327 08/15/22 0542 08/15/22 0832 08/16/22 0620 08/17/22 0551 08/17/22 0743  NA 134*   < > 146* 146* 145 146* 141  --   K 5.4*   < > 3.6 3.4* 3.6 3.1* 2.7*  --   CL 89*   < > 110 108 107 109 103  --   CO2 <7*   < > '26 25 22 29 26  '$ --   GLUCOSE 927*   < > 191* 398* 440* 185* 223*  --   BUN 46*   < > 51* 38* 37* 23* 20  --   CREATININE 2.97*   < > 1.77* 1.20 1.31* 0.94 1.00  --   CALCIUM 8.9   < > 9.2 8.8* 8.9 8.8* 8.3*  --   MG 3.2*  --  2.6*  --   --   --   --  1.9  PHOS  --   --  <1.0*  --  2.4*  --   --  3.6   < > = values in this interval not displayed.   GFR: Estimated Creatinine Clearance: 117.4 mL/min (by C-G formula based on SCr of 1 mg/dL). Liver Function Tests: Recent Labs  Lab 08/13/22 0611  AST 17  ALT 18  ALKPHOS 102  BILITOT 1.7*  PROT 8.6*  ALBUMIN 3.7   No results for input(s): "LIPASE", "AMYLASE" in the last 168 hours. No results for input(s): "AMMONIA" in the last 168 hours. Coagulation Profile: No results for input(s): "INR", "PROTIME" in the last 168 hours. Cardiac Enzymes: No results for input(s): "CKTOTAL", "CKMB", "CKMBINDEX", "TROPONINI" in the last 168 hours. BNP (last 3 results) No results for input(s): "PROBNP" in the last 8760 hours. HbA1C: No results for input(s): "HGBA1C" in the last 72 hours.  CBG: Recent Labs  Lab 08/16/22 2238 08/17/22 0042 08/17/22 0437 08/17/22 0757 08/17/22 1155  GLUCAP 256* 260* 231* 184* 269*   Lipid Profile: No results for input(s): "CHOL", "HDL", "LDLCALC", "TRIG", "CHOLHDL", "LDLDIRECT" in the last 72 hours. Thyroid Function Tests: No results for input(s): "TSH", "T4TOTAL", "FREET4", "T3FREE", "THYROIDAB" in the last 72 hours. Anemia Panel: No results for input(s): "VITAMINB12", "FOLATE", "FERRITIN", "TIBC", "IRON", "RETICCTPCT" in the last 72 hours. Urine analysis:    Component Value Date/Time   COLORURINE YELLOW 08/13/2022 1113    APPEARANCEUR HAZY (A) 08/13/2022 1113   APPEARANCEUR Clear 12/09/2021 1403   LABSPEC 1.018 08/13/2022 1113   PHURINE 5.0 08/13/2022 1113   GLUCOSEU >=500 (A) 08/13/2022 1113   HGBUR MODERATE (A) 08/13/2022 1113   BILIRUBINUR NEGATIVE 08/13/2022 1113   BILIRUBINUR Negative 12/09/2021 1403   KETONESUR 20 (A) 08/13/2022 1113   PROTEINUR 100 (A) 08/13/2022 1113   UROBILINOGEN 0.2 11/14/2021 1146   NITRITE NEGATIVE 08/13/2022 1113   LEUKOCYTESUR NEGATIVE 08/13/2022 1113   Sepsis Labs: '@LABRCNTIP'$ (procalcitonin:4,lacticidven:4)  ) Recent Results (from the past 240 hour(s))  Resp panel by RT-PCR (RSV, Flu A&B, Covid) Anterior Nasal Swab     Status: Abnormal   Collection Time: 08/13/22  6:11 AM   Specimen: Anterior Nasal Swab  Result Value Ref Range Status   SARS Coronavirus 2 by RT PCR NEGATIVE NEGATIVE Final    Comment: (NOTE) SARS-CoV-2 target nucleic acids are NOT DETECTED.  The SARS-CoV-2 RNA is generally detectable in upper respiratory specimens during the acute phase of infection. The lowest concentration of SARS-CoV-2 viral copies this assay can detect is 138 copies/mL. A negative result does not preclude SARS-Cov-2 infection and should not be used as the sole basis for treatment or other patient management decisions. A negative result may occur with  improper specimen collection/handling, submission of specimen other than nasopharyngeal swab, presence of viral mutation(s) within the areas targeted by this assay, and inadequate number of viral copies(<138 copies/mL). A negative result must be combined with clinical observations, patient history, and epidemiological information. The expected result is Negative.  Fact Sheet for Patients:  EntrepreneurPulse.com.au  Fact Sheet for Healthcare Providers:  IncredibleEmployment.be  This test is no t yet approved or cleared by the [place] FDA and  has been authorized for detection and/or  diagnosis of SARS-CoV-2 by FDA under an Emergency Use Authorization (EUA). This EUA will remain  in effect (meaning this test can be used) for the duration of the COVID-19 declaration under Section 564(b)(1) of the Act, 21 U.S.C.section 360bbb-3(b)(1), unless the authorization is terminated  or revoked sooner.       Influenza A by PCR POSITIVE (A) NEGATIVE Final   Influenza B by PCR NEGATIVE NEGATIVE Final    Comment: (NOTE) The Xpert Xpress SARS-CoV-2/FLU/RSV plus assay is intended as an aid in the diagnosis of influenza from Nasopharyngeal swab specimens and should not be used as a sole basis for treatment. Nasal washings and aspirates are unacceptable for Xpert Xpress SARS-CoV-2/FLU/RSV testing.  Fact Sheet for Patients: EntrepreneurPulse.com.au  Fact Sheet for Healthcare Providers: IncredibleEmployment.be  This test is not yet approved or cleared by the [place] FDA and has been authorized for detection and/or diagnosis of SARS-CoV-2 by FDA under an Emergency Use Authorization (EUA). This EUA will remain in effect (meaning this test can be used) for the duration of the COVID-19 declaration under Section 564(b)(1) of the Act, 21 U.S.C. section 360bbb-3(b)(1), unless the authorization is terminated or revoked.     Resp Syncytial Virus by PCR NEGATIVE NEGATIVE Final    Comment: (NOTE) Fact Sheet for Patients: EntrepreneurPulse.com.au  Fact Sheet for Healthcare Providers: IncredibleEmployment.be  This test is not yet approved or cleared by the [place] FDA and has been authorized for detection and/or diagnosis of SARS-CoV-2 by FDA under an Emergency Use Authorization (EUA). This EUA will remain in effect (meaning this test can be used) for the duration of the COVID-19 declaration under Section 564(b)(1) of the Act, 21 U.S.C. section 360bbb-3(b)(1), unless the authorization is terminated  or revoked.  Performed at [facility], [place] [address] [place], [place] [address]       Studies: No results found.  Scheduled Meds:  aspirin EC  81 mg Oral Daily   atorvastatin  20 mg Oral Daily   fluticasone  1 spray Each Nare Daily   heparin  5,000 Units Subcutaneous Q8H   insulin aspart  0-15 Units Subcutaneous Q4H   insulin aspart  0-5 Units Subcutaneous QHS   insulin glargine-yfgn  30 Units Subcutaneous Daily   lisinopril  10 mg Oral  Daily   magic mouthwash w/lidocaine  10 mL Oral TID   metoprolol tartrate  50 mg Oral BID   nystatin  5 mL Oral QID   oseltamivir  75 mg Oral BID   pantoprazole (PROTONIX) IV  40 mg Intravenous Q24H   phosphorus  500 mg Oral BID   potassium chloride  40 mEq Oral BID   sucralfate  1 g Oral TID WC & HS    Continuous Infusions:  fluconazole (DIFLUCAN) IV 200 mg (08/16/22 1152)   promethazine (PHENERGAN) injection (IM or IVPB) 12.5 mg (08/15/22 0627)     LOS: 4 days     Alma Friendly, MD [facility]  If 7PM-7AM, please contact night-coverage [email or web address] 08/17/2022, 2:36 PM

## 2022-08-18 LAB — CBC WITH DIFFERENTIAL/PLATELET
Abs Immature Granulocytes: 0.02 10*3/uL (ref 0.00–0.07)
Basophils Absolute: 0 10*3/uL (ref 0.0–0.1)
Basophils Relative: 0 %
Eosinophils Absolute: 0 10*3/uL (ref 0.0–0.5)
Eosinophils Relative: 0 %
HCT: 37.1 % — ABNORMAL LOW (ref 39.0–52.0)
Hemoglobin: 12.1 g/dL — ABNORMAL LOW (ref 13.0–17.0)
Immature Granulocytes: 1 %
Lymphocytes Relative: 31 %
Lymphs Abs: 1.4 10*3/uL (ref 0.7–4.0)
MCH: 28.7 pg (ref 26.0–34.0)
MCHC: 32.6 g/dL (ref 30.0–36.0)
MCV: 87.9 fL (ref 80.0–100.0)
Monocytes Absolute: 0.6 10*3/uL (ref 0.1–1.0)
Monocytes Relative: 14 %
Neutro Abs: 2.4 10*3/uL (ref 1.7–7.7)
Neutrophils Relative %: 54 %
Platelets: 288 10*3/uL (ref 150–400)
RBC: 4.22 MIL/uL (ref 4.22–5.81)
RDW: 14.8 % (ref 11.5–15.5)
WBC: 4.4 10*3/uL (ref 4.0–10.5)
nRBC: 0 % (ref 0.0–0.2)

## 2022-08-18 LAB — BASIC METABOLIC PANEL
Anion gap: 9 (ref 5–15)
BUN: 19 mg/dL (ref 6–20)
CO2: 27 mmol/L (ref 22–32)
Calcium: 7.8 mg/dL — ABNORMAL LOW (ref 8.9–10.3)
Chloride: 101 mmol/L (ref 98–111)
Creatinine, Ser: 0.99 mg/dL (ref 0.61–1.24)
GFR, Estimated: >60 mL/min (ref >60)
Glucose, Bld: 257 mg/dL — ABNORMAL HIGH (ref 70–99)
Potassium: 2.9 mmol/L — ABNORMAL LOW (ref 3.5–5.1)
Sodium: 137 mmol/L (ref 135–145)

## 2022-08-18 LAB — GLUCOSE, CAPILLARY
Glucose-Capillary: 266 mg/dL — ABNORMAL HIGH (ref 70–99)
Glucose-Capillary: 209 mg/dL — ABNORMAL HIGH (ref 70–99)
Glucose-Capillary: 215 mg/dL — ABNORMAL HIGH (ref 70–99)
Glucose-Capillary: 174 mg/dL — ABNORMAL HIGH (ref 70–99)
Glucose-Capillary: 156 mg/dL — ABNORMAL HIGH (ref 70–99)

## 2022-08-18 LAB — PHOSPHORUS: Phosphorus: 2.7 mg/dL (ref 2.5–4.6)

## 2022-08-18 LAB — MAGNESIUM: Magnesium: 1.7 mg/dL (ref 1.7–2.4)

## 2022-08-18 MED ORDER — POTASSIUM CHLORIDE 20 MEQ PO PACK
40.0000 meq | PACK | Freq: Two times a day (BID) | ORAL | Status: DC
  Administered 2022-08-18: 40 meq via ORAL
  Filled 2022-08-18 (×2): qty 2

## 2022-08-18 MED ORDER — POTASSIUM CHLORIDE CRYS ER 20 MEQ PO TBCR
40.0000 meq | EXTENDED_RELEASE_TABLET | Freq: Once | ORAL | Status: AC
  Administered 2022-08-18: 40 meq via ORAL
  Filled 2022-08-18: qty 2

## 2022-08-18 NOTE — Plan of Care (Signed)

## 2022-08-18 NOTE — Progress Notes (Signed)
Chaplain engaged in an initial visit with Taylor Hughes.  Chaplain was able to offer [employer] by phone.   Chaplain will follow-up as needed.  Chaplain Jack Bolio, MDiv

## 2022-08-18 NOTE — Evaluation (Signed)
Physical Therapy Evaluation Patient Details Name: Taylor Hughes MRN: [id number] DOB: 06-Mar-1971 Today's Date: 08/18/2022  History of Present Illness  52 yo male admitted with DKA, back pain, +flu. Hx of DKA, renal insufficency, DM, Afib, 5th ray amputation, L foot wound  Clinical Impression  On eval, pt was Min guard A for mobility. He walked ~75 feet with support if IV pole (+ intermittent use of hallway handrail). Pt presents with general weakness, decreased activity tolerance and impaired gait/balance. Pt and nursing report pt has been walking to/from bathroom without assistance. Ambulation distance was limited by fatigue, dyspnea. Will plan to follow and progress activity as tolerated. Recommend daily ambulation in hallway with nursing and/or mobility team assistance.        Recommendations for follow up therapy are one component of a multi-disciplinary discharge planning process, led by the attending physician.  Recommendations may be updated based on patient status, additional functional criteria and insurance authorization.  Follow Up Recommendations No PT follow up      Assistance Recommended at Discharge Intermittent Supervision/Assistance  Patient can return home with the following  A little help with walking and/or transfers;Assistance with cooking/housework;Assist for transportation;Help with stairs or ramp for entrance    Equipment Recommendations None recommended by PT  Recommendations for Other Services       Functional Status Assessment Patient has had a recent decline in their functional status and demonstrates the ability to make significant improvements in function in a reasonable and predictable amount of time.     Precautions / Restrictions Precautions Precautions: Fall Restrictions Weight Bearing Restrictions: No      Mobility  Bed Mobility Overal bed mobility: Modified Independent                  Transfers Overall transfer level: Modified  independent                      Ambulation/Gait Ambulation/Gait assistance: Min guard Gait Distance (Feet): 75 Feet Assistive device: IV Pole (+ intermittent use of hallway handrail) Gait Pattern/deviations: Step-through pattern, Decreased stride length       General Gait Details: Unsteady. Fatigues easily. Dyspnea 2/4 with some coughing  Stairs            Wheelchair Mobility    Modified Rankin (Stroke Patients Only)       Balance Overall balance assessment: Needs assistance         Standing balance support: During functional activity Standing balance-Leahy Scale: Fair                               Pertinent Vitals/Pain Pain Assessment Pain Assessment: Faces Faces Pain Scale: Hurts little more Pain Location: abdomen, LEs Pain Descriptors / Indicators: Discomfort, Sore Pain Intervention(s): Limited activity within patient's tolerance, Monitored during session, Repositioned    Home Living Family/patient expects to be discharged to:: Private residence Living Arrangements: Other relatives Available Help at Discharge: Family;Available PRN/intermittently Type of Home: House         Home Layout: One level Home Equipment: Rollator (4 wheels);Cane - quad      Prior Function               Mobility Comments: independent ADLs Comments: independent; works as a Surveyor, quantity Extremity Assessment Upper Extremity Assessment: Overall WFL for tasks assessed  Lower Extremity Assessment Lower Extremity Assessment: Generalized weakness    Cervical / Trunk Assessment Cervical / Trunk Assessment: Normal  Communication   Communication: No difficulties  Cognition Arousal/Alertness: Awake/alert Behavior During Therapy: WFL for tasks assessed/performed Overall Cognitive Status: Within Functional Limits for tasks assessed                                           General Comments      Exercises     Assessment/Plan    PT Assessment Patient needs continued PT services  PT Problem List Decreased mobility;Decreased activity tolerance       PT Treatment Interventions DME instruction;Gait training;Therapeutic exercise;Balance training;Therapeutic activities;Patient/family education;Functional mobility training    PT Goals (Current goals can be found in the Care Plan section)  Acute Rehab PT Goals Patient Stated Goal: to feel better PT Goal Formulation: With patient Time For Goal Achievement: 09/01/22 Potential to Achieve Goals: Good    Frequency Min 3X/week     Co-evaluation               AM-PAC PT "6 Clicks" Mobility  Outcome Measure Help needed turning from your back to your side while in a flat bed without using bedrails?: None Help needed moving from lying on your back to sitting on the side of a flat bed without using bedrails?: None Help needed moving to and from a bed to a chair (including a wheelchair)?: None Help needed standing up from a chair using your arms (e.g., wheelchair or bedside chair)?: None Help needed to walk in hospital room?: A Little Help needed climbing 3-5 steps with a railing? : A Little 6 Click Score: 22    End of Session Equipment Utilized During Treatment: Gait belt Activity Tolerance: Patient limited by fatigue;Patient tolerated treatment well Patient left: in chair;with call bell/phone within reach   PT Visit Diagnosis: Unsteadiness on feet (R26.81);Muscle weakness (generalized) (M62.81)    Time: 7106-2694 PT Time Calculation (min) (ACUTE ONLY): 18 min   Charges:   PT Evaluation $PT Eval Low Complexity: [place], PT Acute Rehabilitation  Office: [phone]

## 2022-08-18 NOTE — Progress Notes (Signed)
PROGRESS NOTE  Taylor Hughes ZOX:[id number] DOB: July 12, 1971 DOA: 08/13/2022 PCP: Charlott Rakes, MD  HPI/Recap of past 24 hours: 52 y/o M who presented to [facility] ER on 1/3 with reports of headache, elevated glucose, mid back pain, SOB and vomiting X 1 day. The patient is a known diabetic on Trulicity, Glargine & SSI at home.  He reports he has not taken his medications in two days. Denies tobacco, ETOH/drug use. Initial labs - Na 134, K 5.4, Cl 89, CO2 <7, glucose 927, BUN 46, Cr 2.97, Mg 3.2, WBC 12.9, Hgb 13.3, and platelets 293.  CXR was negative for acute process.  Viral screening positive for Influenza A (negative COVID, RSV). VBG notable for venous pH of 6.95.  He is on RA. The patient was treated with 2L IVF, & insulin infusion. Tamilflu initiated.  PCCM consulted, admitted patient, stabilized patient and transferred care to [facility] on 08/14/2022.     Today, patient reports some improvement in odynophagia.  Very deconditioned    Assessment/Plan: Active Problems:   AKI (acute kidney injury) ([place])   DKA (diabetic ketoacidosis) ([place])   Influenza A   DKA Type 2 diabetes mellitus, poorly controlled A1c 11.9 Anion gap closed, bicarb normalized Switched to subcu insulin Continue Semglee, SSI, Accu-Cheks, hypoglycemic protocol  Influenza A infection Currently on room air, saturating well Chest x-ray now with cardiomegaly, no infectious process Completed Tamiflu, supportive management  Oral thrush Likely from uncontrolled diabetes Started on IV fluconazole, with nystatin swish and swallow Start magic mouthwash, pantoprazole, carafate Monitor  AKI Improved Creatinine 2.97 on presentation, baseline around 1.1 Likely 2/2 above S/p IV fluids Daily BMP  Hypophosphatemia/hypokalemia Replace as needed  Hypertension/hyperlipidemia Continue home Lipitor, Lopressor, restart lisinopril Continue to hold home Lasix, HCTZ As needed hydralazine  Noted cardiomegaly on chest x-ray In  addition to chronic venous stasis changes on BLE BNP 112 Echo showed EF of 60 to 65%, with grade 1 diastolic dysfunction, no regional wall motion abnormality  Obesity Lifestyle modification advised     Estimated body mass index is 37.76 kg/m as calculated from the following:   Height as of this encounter: '5\' 11"'$  (1.803 m).   Weight as of this encounter: 122.8 kg.     Code Status: Full  Family Communication: None at bedside  Disposition Plan: Status is: Inpatient Remains inpatient appropriate because: Level of care      Consultants: PCCM  Procedures: None  Antimicrobials: None  DVT prophylaxis: Heparin [place]   Objective: Vitals:   08/17/22 1959 08/18/22 0500 08/18/22 0526 08/18/22 1543  BP: (!) 145/95  108/68 106/83  Pulse: 93  (!) 110 92  Resp: '16  20 14  '$ Temp: 99.1 F (37.3 C)  99.2 F (37.3 C) 98.6 F (37 C)  TempSrc: Oral  Oral Oral  SpO2: 97%  95% 99%  Weight:  122.8 kg    Height:        Intake/Output Summary (Last 24 hours) at 08/18/2022 1624 Last data filed at 08/18/2022 1509 Gross per 24 hour  Intake 1432 ml  Output --  Net 1432 ml   Filed Weights   08/16/22 0115 08/17/22 0451 08/18/22 0500  Weight: 121.9 kg 121.8 kg 122.8 kg    Exam: General:NAD Cardiovascular: S1, S2 present Respiratory: CTAB Abdomen: Soft, nontender, nondistended, bowel sounds present Musculoskeletal: Lymphedema, noted chronic vascular changes Skin: As above Psychiatry: Normal mood     Data Reviewed: CBC: Recent Labs  Lab 08/13/22 1109 08/14/22 0327 08/15/22 0542 08/16/22 8119 08/17/22 1478  08/18/22 0557  WBC 15.5* 9.9 6.5 4.8 4.2 4.4  NEUTROABS 13.7*  --  5.5 3.4 3.1 2.4  HGB 11.9* 12.5* 13.0 12.5* 12.3* 12.1*  HCT 38.7* 37.3* 39.8 38.0* 37.9* 37.1*  MCV 93.3 84.8 87.3 87.6 87.7 87.9  PLT 275 288 273 269 273 759   Basic Metabolic Panel: Recent Labs  Lab 08/13/22 0611 08/13/22 0700 08/14/22 0327 08/15/22 0542 08/15/22 0832 08/16/22 0620  08/17/22 0551 08/17/22 0743 08/18/22 0557  NA 134*   < > 146* 146* 145 146* 141  --  137  K 5.4*   < > 3.6 3.4* 3.6 3.1* 2.7*  --  2.9*  CL 89*   < > 110 108 107 109 103  --  101  CO2 <7*   < > '26 25 22 29 26  '$ --  27  GLUCOSE 927*   < > 191* 398* 440* 185* 223*  --  257*  BUN 46*   < > 51* 38* 37* 23* 20  --  19  CREATININE 2.97*   < > 1.77* 1.20 1.31* 0.94 1.00  --  0.99  CALCIUM 8.9   < > 9.2 8.8* 8.9 8.8* 8.3*  --  7.8*  MG 3.2*  --  2.6*  --   --   --   --  1.9 1.7  PHOS  --   --  <1.0*  --  2.4*  --   --  3.6 2.7   < > = values in this interval not displayed.   GFR: Estimated Creatinine Clearance: 119.1 mL/min (by C-G formula based on SCr of 0.99 mg/dL). Liver Function Tests: Recent Labs  Lab 08/13/22 0611  AST 17  ALT 18  ALKPHOS 102  BILITOT 1.7*  PROT 8.6*  ALBUMIN 3.7   No results for input(s): "LIPASE", "AMYLASE" in the last 168 hours. No results for input(s): "AMMONIA" in the last 168 hours. Coagulation Profile: No results for input(s): "INR", "PROTIME" in the last 168 hours. Cardiac Enzymes: No results for input(s): "CKTOTAL", "CKMB", "CKMBINDEX", "TROPONINI" in the last 168 hours. BNP (last 3 results) No results for input(s): "PROBNP" in the last 8760 hours. HbA1C: No results for input(s): "HGBA1C" in the last 72 hours.  CBG: Recent Labs  Lab 08/17/22 1155 08/17/22 1705 08/17/22 2209 08/18/22 0724 08/18/22 1220  GLUCAP 269* 268* 187* 266* 209*   Lipid Profile: No results for input(s): "CHOL", "HDL", "LDLCALC", "TRIG", "CHOLHDL", "LDLDIRECT" in the last 72 hours. Thyroid Function Tests: No results for input(s): "TSH", "T4TOTAL", "FREET4", "T3FREE", "THYROIDAB" in the last 72 hours. Anemia Panel: No results for input(s): "VITAMINB12", "FOLATE", "FERRITIN", "TIBC", "IRON", "RETICCTPCT" in the last 72 hours. Urine analysis:    Component Value Date/Time   COLORURINE YELLOW 08/13/2022 1113   APPEARANCEUR HAZY (A) 08/13/2022 1113   APPEARANCEUR Clear  12/09/2021 1403   LABSPEC 1.018 08/13/2022 1113   PHURINE 5.0 08/13/2022 1113   GLUCOSEU >=500 (A) 08/13/2022 1113   HGBUR MODERATE (A) 08/13/2022 1113   BILIRUBINUR NEGATIVE 08/13/2022 1113   BILIRUBINUR Negative 12/09/2021 1403   KETONESUR 20 (A) 08/13/2022 1113   PROTEINUR 100 (A) 08/13/2022 1113   UROBILINOGEN 0.2 11/14/2021 1146   NITRITE NEGATIVE 08/13/2022 1113   LEUKOCYTESUR NEGATIVE 08/13/2022 1113   Sepsis Labs: '@LABRCNTIP'$ (procalcitonin:4,lacticidven:4)  ) Recent Results (from the past 240 hour(s))  Resp panel by RT-PCR (RSV, Flu A&B, Covid) Anterior Nasal Swab     Status: Abnormal   Collection Time: 08/13/22  6:11 AM   Specimen: Anterior Nasal  Swab  Result Value Ref Range Status   SARS Coronavirus 2 by RT PCR NEGATIVE NEGATIVE Final    Comment: (NOTE) SARS-CoV-2 target nucleic acids are NOT DETECTED.  The SARS-CoV-2 RNA is generally detectable in upper respiratory specimens during the acute phase of infection. The lowest concentration of SARS-CoV-2 viral copies this assay can detect is 138 copies/mL. A negative result does not preclude SARS-Cov-2 infection and should not be used as the sole basis for treatment or other patient management decisions. A negative result may occur with  improper specimen collection/handling, submission of specimen other than nasopharyngeal swab, presence of viral mutation(s) within the areas targeted by this assay, and inadequate number of viral copies(<138 copies/mL). A negative result must be combined with clinical observations, patient history, and epidemiological information. The expected result is Negative.  Fact Sheet for Patients:  EntrepreneurPulse.com.au  Fact Sheet for Healthcare Providers:  IncredibleEmployment.be  This test is no t yet approved or cleared by the [place] FDA and  has been authorized for detection and/or diagnosis of SARS-CoV-2 by FDA under an Emergency Use  Authorization (EUA). This EUA will remain  in effect (meaning this test can be used) for the duration of the COVID-19 declaration under Section 564(b)(1) of the Act, 21 U.S.C.section 360bbb-3(b)(1), unless the authorization is terminated  or revoked sooner.       Influenza A by PCR POSITIVE (A) NEGATIVE Final   Influenza B by PCR NEGATIVE NEGATIVE Final    Comment: (NOTE) The Xpert Xpress SARS-CoV-2/FLU/RSV plus assay is intended as an aid in the diagnosis of influenza from Nasopharyngeal swab specimens and should not be used as a sole basis for treatment. Nasal washings and aspirates are unacceptable for Xpert Xpress SARS-CoV-2/FLU/RSV testing.  Fact Sheet for Patients: EntrepreneurPulse.com.au  Fact Sheet for Healthcare Providers: IncredibleEmployment.be  This test is not yet approved or cleared by the [place] FDA and has been authorized for detection and/or diagnosis of SARS-CoV-2 by FDA under an Emergency Use Authorization (EUA). This EUA will remain in effect (meaning this test can be used) for the duration of the COVID-19 declaration under Section 564(b)(1) of the Act, 21 U.S.C. section 360bbb-3(b)(1), unless the authorization is terminated or revoked.     Resp Syncytial Virus by PCR NEGATIVE NEGATIVE Final    Comment: (NOTE) Fact Sheet for Patients: EntrepreneurPulse.com.au  Fact Sheet for Healthcare Providers: IncredibleEmployment.be  This test is not yet approved or cleared by the [place] FDA and has been authorized for detection and/or diagnosis of SARS-CoV-2 by FDA under an Emergency Use Authorization (EUA). This EUA will remain in effect (meaning this test can be used) for the duration of the COVID-19 declaration under Section 564(b)(1) of the Act, 21 U.S.C. section 360bbb-3(b)(1), unless the authorization is terminated or revoked.  Performed at [facility],  [place] [address] [place], [place] [address]       Studies: No results found.  Scheduled Meds:  aspirin EC  81 mg Oral Daily   atorvastatin  20 mg Oral Daily   fluticasone  1 spray Each Nare Daily   heparin  5,000 Units Subcutaneous Q8H   insulin aspart  0-15 Units Subcutaneous Q4H   insulin aspart  0-5 Units Subcutaneous QHS   insulin glargine-yfgn  30 Units Subcutaneous Daily   lisinopril  10 mg Oral Daily   magic mouthwash w/lidocaine  10 mL Oral TID   metoprolol tartrate  50 mg Oral BID   nystatin  5 mL Oral QID  pantoprazole (PROTONIX) IV  40 mg Intravenous Q24H   potassium chloride  40 mEq Oral BID   sucralfate  1 g Oral TID WC & HS    Continuous Infusions:  fluconazole (DIFLUCAN) IV 200 mg (08/18/22 1305)   promethazine (PHENERGAN) injection (IM or IVPB) 12.5 mg (08/18/22 1048)     LOS: 5 days     Alma Friendly, MD [facility]  If 7PM-7AM, please contact night-coverage [email or web address] 08/18/2022, 4:24 PM

## 2022-08-19 LAB — CBC WITH DIFFERENTIAL/PLATELET
Abs Immature Granulocytes: 0.01 10*3/uL (ref 0.00–0.07)
Basophils Absolute: 0 10*3/uL (ref 0.0–0.1)
Basophils Relative: 0 %
Eosinophils Absolute: 0 10*3/uL (ref 0.0–0.5)
Eosinophils Relative: 1 %
HCT: 37.2 % — ABNORMAL LOW (ref 39.0–52.0)
Hemoglobin: 11.9 g/dL — ABNORMAL LOW (ref 13.0–17.0)
Immature Granulocytes: 0 %
Lymphocytes Relative: 38 %
Lymphs Abs: 1.6 10*3/uL (ref 0.7–4.0)
MCH: 27.9 pg (ref 26.0–34.0)
MCHC: 32 g/dL (ref 30.0–36.0)
MCV: 87.1 fL (ref 80.0–100.0)
Monocytes Absolute: 0.6 10*3/uL (ref 0.1–1.0)
Monocytes Relative: 14 %
Neutro Abs: 2 10*3/uL (ref 1.7–7.7)
Neutrophils Relative %: 47 %
Platelets: 299 10*3/uL (ref 150–400)
RBC: 4.27 MIL/uL (ref 4.22–5.81)
RDW: 14.7 % (ref 11.5–15.5)
WBC: 4.1 10*3/uL (ref 4.0–10.5)
nRBC: 0 % (ref 0.0–0.2)

## 2022-08-19 LAB — RENAL FUNCTION PANEL
Albumin: 1.9 g/dL — ABNORMAL LOW (ref 3.5–5.0)
Anion gap: 8 (ref 5–15)
BUN: 12 mg/dL (ref 6–20)
CO2: 27 mmol/L (ref 22–32)
Calcium: 8 mg/dL — ABNORMAL LOW (ref 8.9–10.3)
Chloride: 104 mmol/L (ref 98–111)
Creatinine, Ser: 0.81 mg/dL (ref 0.61–1.24)
GFR, Estimated: >60 mL/min (ref >60)
Glucose, Bld: 62 mg/dL — ABNORMAL LOW (ref 70–99)
Phosphorus: 2.2 mg/dL — ABNORMAL LOW (ref 2.5–4.6)
Potassium: 3 mmol/L — ABNORMAL LOW (ref 3.5–5.1)
Sodium: 139 mmol/L (ref 135–145)

## 2022-08-19 LAB — GLUCOSE, CAPILLARY
Glucose-Capillary: 188 mg/dL — ABNORMAL HIGH (ref 70–99)
Glucose-Capillary: 213 mg/dL — ABNORMAL HIGH (ref 70–99)
Glucose-Capillary: 254 mg/dL — ABNORMAL HIGH (ref 70–99)
Glucose-Capillary: 269 mg/dL — ABNORMAL HIGH (ref 70–99)
Glucose-Capillary: 67 mg/dL — ABNORMAL LOW (ref 70–99)
Glucose-Capillary: 79 mg/dL (ref 70–99)
Glucose-Capillary: 79 mg/dL (ref 70–99)

## 2022-08-19 LAB — MAGNESIUM: Magnesium: 1.7 mg/dL (ref 1.7–2.4)

## 2022-08-19 MED ORDER — POTASSIUM CHLORIDE 10 MEQ/100ML IV SOLN
10.0000 meq | INTRAVENOUS | Status: AC
  Administered 2022-08-19 (×2): 10 meq via INTRAVENOUS
  Filled 2022-08-19 (×2): qty 100

## 2022-08-19 MED ORDER — GLUCERNA SHAKE PO LIQD
237.0000 mL | Freq: Three times a day (TID) | ORAL | Status: DC
  Administered 2022-08-19 – 2022-08-21 (×5): 237 mL via ORAL
  Filled 2022-08-19 (×8): qty 237

## 2022-08-19 MED ORDER — INSULIN GLARGINE-YFGN 100 UNIT/ML ~~LOC~~ SOLN
15.0000 [IU] | Freq: Every day | SUBCUTANEOUS | Status: DC
  Administered 2022-08-19: 15 [IU] via SUBCUTANEOUS
  Filled 2022-08-19 (×3): qty 0.15

## 2022-08-19 MED ORDER — K PHOS MONO-SOD PHOS DI & MONO 155-852-130 MG PO TABS
500.0000 mg | ORAL_TABLET | Freq: Two times a day (BID) | ORAL | Status: AC
  Administered 2022-08-19 – 2022-08-20 (×3): 500 mg via ORAL
  Filled 2022-08-19 (×4): qty 2

## 2022-08-19 MED ORDER — POTASSIUM CHLORIDE CRYS ER 20 MEQ PO TBCR
40.0000 meq | EXTENDED_RELEASE_TABLET | Freq: Once | ORAL | Status: AC
  Administered 2022-08-19: 40 meq via ORAL
  Filled 2022-08-19: qty 2

## 2022-08-19 MED ORDER — POTASSIUM CHLORIDE 10 MEQ/100ML IV SOLN
10.0000 meq | INTRAVENOUS | Status: AC
  Administered 2022-08-19 (×2): 10 meq via INTRAVENOUS
  Filled 2022-08-19 (×3): qty 100

## 2022-08-19 NOTE — Progress Notes (Addendum)
PROGRESS NOTE  Taylor Hughes GYI:[id number] DOB: 04/05/1971 DOA: 08/13/2022 PCP: Charlott Rakes, MD  HPI/Recap of past 24 hours: 52 y/o M who presented to [facility] ER on 1/3 with reports of headache, elevated glucose, mid back pain, SOB and vomiting X 1 day. The patient is a known diabetic on Trulicity, Glargine & SSI at home.  He reports he has not taken his medications in two days. Denies tobacco, ETOH/drug use. Initial labs - Na 134, K 5.4, Cl 89, CO2 <7, glucose 927, BUN 46, Cr 2.97, Mg 3.2, WBC 12.9, Hgb 13.3, and platelets 293.  CXR was negative for acute process.  Viral screening positive for Influenza A (negative COVID, RSV). VBG notable for venous pH of 6.95.  He is on RA. The patient was treated with 2L IVF, & insulin infusion. Tamilflu initiated.  PCCM consulted, admitted patient, stabilized patient and transferred care to [facility] on 08/14/2022.     Patient reports improvements in his odynophagia, but still with nausea and vomiting, unable to keep food down.  Suspect gastroparesis.  Very deconditioned.    Assessment/Plan: Active Problems:   AKI (acute kidney injury) ([place])   DKA (diabetic ketoacidosis) (HCC)   Influenza A   DKA Type 2 diabetes mellitus, poorly controlled Possible gastroparesis A1c 11.9 Anion gap closed, bicarb normalized Switched to subcu insulin Continue Semglee, SSI, Accu-Cheks, hypoglycemic protocol Gastric emptying study ordered, patient made n.p.o. at midnight, currently avoiding every motility agent such as Reglan until after test is completed Consider Reglan after test, if still with nausea/vomiting, may require GI input Advised small frequent meals  Influenza A infection Currently on room air, saturating well Chest x-ray now with cardiomegaly, no infectious process Completed Tamiflu, supportive management  Oral candidiasis, likely esophageal as well Likely from uncontrolled diabetes Started on IV fluconazole (will need a min of 14 days), nystatin swish  and swallow Start magic mouthwash, pantoprazole, carafate Monitor  AKI Improved Creatinine 2.97 on presentation, baseline around 1.1 Likely 2/2 above S/p IV fluids Daily BMP  Hypophosphatemia/hypokalemia Replace as needed  Hypertension/hyperlipidemia Continue home Lipitor, Lopressor, restart lisinopril Continue to hold home Lasix, HCTZ As needed hydralazine  Noted cardiomegaly on chest x-ray In addition to chronic venous stasis changes on BLE BNP 112 Echo showed EF of 60 to 65%, with grade 1 diastolic dysfunction, no regional wall motion abnormality  Obesity Lifestyle modification advised     Estimated body mass index is 37.64 kg/m as calculated from the following:   Height as of this encounter: '5\' 11"'$  (1.803 m).   Weight as of this encounter: 122.4 kg.     Code Status: Full  Family Communication: None at bedside  Disposition Plan: Status is: Inpatient Remains inpatient appropriate because: Level of care      Consultants: PCCM  Procedures: None  Antimicrobials: None  DVT prophylaxis: Heparin [place]   Objective: Vitals:   08/18/22 2104 08/19/22 0500 08/19/22 0607 08/19/22 1328  BP: (!) 165/112  (!) 146/90 132/79  Pulse: 92  89 87  Resp:   16 17  Temp:   98.8 F (37.1 C) 98 F (36.7 C)  TempSrc:   Oral   SpO2:   95% 97%  Weight:  122.4 kg    Height:        Intake/Output Summary (Last 24 hours) at 08/19/2022 1653 Last data filed at 08/19/2022 1333 Gross per 24 hour  Intake 1347.29 ml  Output 700 ml  Net 647.29 ml   Filed Weights   08/17/22 0451 08/18/22 0500  08/19/22 0500  Weight: 121.8 kg 122.8 kg 122.4 kg    Exam: General:NAD Cardiovascular: S1, S2 present Respiratory: CTAB Abdomen: Soft, nontender, nondistended, bowel sounds present Musculoskeletal: Lymphedema, noted chronic vascular changes Skin: As above Psychiatry: Normal mood     Data Reviewed: CBC: Recent Labs  Lab 08/15/22 0542 08/16/22 0620 08/17/22 0551  08/18/22 0557 08/19/22 0543  WBC 6.5 4.8 4.2 4.4 4.1  NEUTROABS 5.5 3.4 3.1 2.4 2.0  HGB 13.0 12.5* 12.3* 12.1* 11.9*  HCT 39.8 38.0* 37.9* 37.1* 37.2*  MCV 87.3 87.6 87.7 87.9 87.1  PLT 273 269 273 288 244   Basic Metabolic Panel: Recent Labs  Lab 08/13/22 0611 08/13/22 0700 08/14/22 0327 08/15/22 0542 08/15/22 0832 08/16/22 0620 08/17/22 0551 08/17/22 0743 08/18/22 0557 08/19/22 0543  NA 134*   < > 146*   < > 145 146* 141  --  137 139  K 5.4*   < > 3.6   < > 3.6 3.1* 2.7*  --  2.9* 3.0*  CL 89*   < > 110   < > 107 109 103  --  101 104  CO2 <7*   < > 26   < > '22 29 26  '$ --  27 27  GLUCOSE 927*   < > 191*   < > 440* 185* 223*  --  257* 62*  BUN 46*   < > 51*   < > 37* 23* 20  --  19 12  CREATININE 2.97*   < > 1.77*   < > 1.31* 0.94 1.00  --  0.99 0.81  CALCIUM 8.9   < > 9.2   < > 8.9 8.8* 8.3*  --  7.8* 8.0*  MG 3.2*  --  2.6*  --   --   --   --  1.9 1.7 1.7  PHOS  --   --  <1.0*  --  2.4*  --   --  3.6 2.7 2.2*   < > = values in this interval not displayed.   GFR: Estimated Creatinine Clearance: 145.2 mL/min (by C-G formula based on SCr of 0.81 mg/dL). Liver Function Tests: Recent Labs  Lab 08/13/22 0611 08/19/22 0543  AST 17  --   ALT 18  --   ALKPHOS 102  --   BILITOT 1.7*  --   PROT 8.6*  --   ALBUMIN 3.7 1.9*   No results for input(s): "LIPASE", "AMYLASE" in the last 168 hours. No results for input(s): "AMMONIA" in the last 168 hours. Coagulation Profile: No results for input(s): "INR", "PROTIME" in the last 168 hours. Cardiac Enzymes: No results for input(s): "CKTOTAL", "CKMB", "CKMBINDEX", "TROPONINI" in the last 168 hours. BNP (last 3 results) No results for input(s): "PROBNP" in the last 8760 hours. HbA1C: No results for input(s): "HGBA1C" in the last 72 hours.  CBG: Recent Labs  Lab 08/19/22 0323 08/19/22 0623 08/19/22 0650 08/19/22 1141 08/19/22 1528  GLUCAP 79 67* 79 188* 254*   Lipid Profile: No results for input(s): "CHOL", "HDL",  "LDLCALC", "TRIG", "CHOLHDL", "LDLDIRECT" in the last 72 hours. Thyroid Function Tests: No results for input(s): "TSH", "T4TOTAL", "FREET4", "T3FREE", "THYROIDAB" in the last 72 hours. Anemia Panel: No results for input(s): "VITAMINB12", "FOLATE", "FERRITIN", "TIBC", "IRON", "RETICCTPCT" in the last 72 hours. Urine analysis:    Component Value Date/Time   COLORURINE YELLOW 08/13/2022 1113   APPEARANCEUR HAZY (A) 08/13/2022 1113   APPEARANCEUR Clear 12/09/2021 1403   LABSPEC 1.018 08/13/2022 1113   PHURINE 5.0  08/13/2022 1113   GLUCOSEU >=500 (A) 08/13/2022 1113   HGBUR MODERATE (A) 08/13/2022 1113   BILIRUBINUR NEGATIVE 08/13/2022 1113   BILIRUBINUR Negative 12/09/2021 1403   KETONESUR 20 (A) 08/13/2022 1113   PROTEINUR 100 (A) 08/13/2022 1113   UROBILINOGEN 0.2 11/14/2021 1146   NITRITE NEGATIVE 08/13/2022 1113   LEUKOCYTESUR NEGATIVE 08/13/2022 1113   Sepsis Labs: '@LABRCNTIP'$ (procalcitonin:4,lacticidven:4)  ) Recent Results (from the past 240 hour(s))  Resp panel by RT-PCR (RSV, Flu A&B, Covid) Anterior Nasal Swab     Status: Abnormal   Collection Time: 08/13/22  6:11 AM   Specimen: Anterior Nasal Swab  Result Value Ref Range Status   SARS Coronavirus 2 by RT PCR NEGATIVE NEGATIVE Final    Comment: (NOTE) SARS-CoV-2 target nucleic acids are NOT DETECTED.  The SARS-CoV-2 RNA is generally detectable in upper respiratory specimens during the acute phase of infection. The lowest concentration of SARS-CoV-2 viral copies this assay can detect is 138 copies/mL. A negative result does not preclude SARS-Cov-2 infection and should not be used as the sole basis for treatment or other patient management decisions. A negative result may occur with  improper specimen collection/handling, submission of specimen other than nasopharyngeal swab, presence of viral mutation(s) within the areas targeted by this assay, and inadequate number of viral copies(<138 copies/mL). A negative result  must be combined with clinical observations, patient history, and epidemiological information. The expected result is Negative.  Fact Sheet for Patients:  EntrepreneurPulse.com.au  Fact Sheet for Healthcare Providers:  IncredibleEmployment.be  This test is no t yet approved or cleared by the [place] FDA and  has been authorized for detection and/or diagnosis of SARS-CoV-2 by FDA under an Emergency Use Authorization (EUA). This EUA will remain  in effect (meaning this test can be used) for the duration of the COVID-19 declaration under Section 564(b)(1) of the Act, 21 U.S.C.section 360bbb-3(b)(1), unless the authorization is terminated  or revoked sooner.       Influenza A by PCR POSITIVE (A) NEGATIVE Final   Influenza B by PCR NEGATIVE NEGATIVE Final    Comment: (NOTE) The Xpert Xpress SARS-CoV-2/FLU/RSV plus assay is intended as an aid in the diagnosis of influenza from Nasopharyngeal swab specimens and should not be used as a sole basis for treatment. Nasal washings and aspirates are unacceptable for Xpert Xpress SARS-CoV-2/FLU/RSV testing.  Fact Sheet for Patients: EntrepreneurPulse.com.au  Fact Sheet for Healthcare Providers: IncredibleEmployment.be  This test is not yet approved or cleared by the [place] FDA and has been authorized for detection and/or diagnosis of SARS-CoV-2 by FDA under an Emergency Use Authorization (EUA). This EUA will remain in effect (meaning this test can be used) for the duration of the COVID-19 declaration under Section 564(b)(1) of the Act, 21 U.S.C. section 360bbb-3(b)(1), unless the authorization is terminated or revoked.     Resp Syncytial Virus by PCR NEGATIVE NEGATIVE Final    Comment: (NOTE) Fact Sheet for Patients: EntrepreneurPulse.com.au  Fact Sheet for Healthcare Providers: IncredibleEmployment.be  This test  is not yet approved or cleared by the [place] FDA and has been authorized for detection and/or diagnosis of SARS-CoV-2 by FDA under an Emergency Use Authorization (EUA). This EUA will remain in effect (meaning this test can be used) for the duration of the COVID-19 declaration under Section 564(b)(1) of the Act, 21 U.S.C. section 360bbb-3(b)(1), unless the authorization is terminated or revoked.  Performed at [facility], [place] [address] [place], [place] [address]  Studies: No results found.  Scheduled Meds:  aspirin EC  81 mg Oral Daily   atorvastatin  20 mg Oral Daily   feeding supplement (GLUCERNA SHAKE)  237 mL Oral TID BM   fluticasone  1 spray Each Nare Daily   heparin  5,000 Units Subcutaneous Q8H   insulin aspart  0-15 Units Subcutaneous Q4H   insulin aspart  0-5 Units Subcutaneous QHS   insulin glargine-yfgn  15 Units Subcutaneous Daily   lisinopril  10 mg Oral Daily   magic mouthwash w/lidocaine  10 mL Oral TID   metoprolol tartrate  50 mg Oral BID   nystatin  5 mL Oral QID   pantoprazole (PROTONIX) IV  40 mg Intravenous Q24H   phosphorus  500 mg Oral BID   sucralfate  1 g Oral TID WC & HS    Continuous Infusions:  fluconazole (DIFLUCAN) IV 200 mg (08/19/22 1601)   promethazine (PHENERGAN) injection (IM or IVPB) 12.5 mg (08/18/22 2116)     LOS: 6 days     Alma Friendly, MD [facility]  If 7PM-7AM, please contact night-coverage [email or web address] 08/19/2022, 4:53 PM

## 2022-08-19 NOTE — Progress Notes (Signed)
Hypoglycemic Event  CBG: 67  Treatment: 4 oz juice/soda  Symptoms: None  Follow-up CBG: [id number] CBG Result:79  Possible Reasons for Event: Unknown  Comments/MD notified:Ez[place]uka MD notified 08/19/22 [place]

## 2022-08-19 NOTE — Progress Notes (Signed)
Mobility Specialist - Progress Note   08/19/22 1150  Mobility  Activity Ambulated with assistance in hallway  Level of Assistance Contact guard assist, steadying assist  Assistive Device None  Distance Ambulated (ft) 230 ft  Activity Response Tolerated well  Mobility Referral Yes  $Mobility charge 1 Mobility   Pt received in bed and agreeable to mobility even though pt stated feeling weak. Pt had some LOB throughout ambulation that was correct with gait belt. No complaints during mobility. Pt EOB after session with all needs met.   [facility]

## 2022-08-20 ENCOUNTER — Inpatient Hospital Stay (HOSPITAL_COMMUNITY): Payer: Self-pay

## 2022-08-20 DIAGNOSIS — E1310 Other specified diabetes mellitus with ketoacidosis without coma: Secondary | ICD-10-CM

## 2022-08-20 LAB — CBC WITH DIFFERENTIAL/PLATELET
Abs Immature Granulocytes: 0.02 10*3/uL (ref 0.00–0.07)
Basophils Absolute: 0 10*3/uL (ref 0.0–0.1)
Basophils Relative: 0 %
Eosinophils Absolute: 0 10*3/uL (ref 0.0–0.5)
Eosinophils Relative: 1 %
HCT: 35.1 % — ABNORMAL LOW (ref 39.0–52.0)
Hemoglobin: 11.1 g/dL — ABNORMAL LOW (ref 13.0–17.0)
Immature Granulocytes: 1 %
Lymphocytes Relative: 40 %
Lymphs Abs: 1.7 10*3/uL (ref 0.7–4.0)
MCH: 28.5 pg (ref 26.0–34.0)
MCHC: 31.6 g/dL (ref 30.0–36.0)
MCV: 90 fL (ref 80.0–100.0)
Monocytes Absolute: 0.6 10*3/uL (ref 0.1–1.0)
Monocytes Relative: 14 %
Neutro Abs: 1.9 10*3/uL (ref 1.7–7.7)
Neutrophils Relative %: 44 %
Platelets: 280 10*3/uL (ref 150–400)
RBC: 3.9 MIL/uL — ABNORMAL LOW (ref 4.22–5.81)
RDW: 14.9 % (ref 11.5–15.5)
WBC: 4.2 10*3/uL (ref 4.0–10.5)
nRBC: 0 % (ref 0.0–0.2)

## 2022-08-20 LAB — GLUCOSE, CAPILLARY
Glucose-Capillary: 122 mg/dL — ABNORMAL HIGH (ref 70–99)
Glucose-Capillary: 139 mg/dL — ABNORMAL HIGH (ref 70–99)
Glucose-Capillary: 208 mg/dL — ABNORMAL HIGH (ref 70–99)
Glucose-Capillary: 205 mg/dL — ABNORMAL HIGH (ref 70–99)
Glucose-Capillary: 394 mg/dL — ABNORMAL HIGH (ref 70–99)
Glucose-Capillary: 455 mg/dL — ABNORMAL HIGH (ref 70–99)

## 2022-08-20 LAB — RENAL FUNCTION PANEL
Albumin: 1.9 g/dL — ABNORMAL LOW (ref 3.5–5.0)
Anion gap: 7 (ref 5–15)
BUN: 13 mg/dL (ref 6–20)
CO2: 26 mmol/L (ref 22–32)
Calcium: 7.6 mg/dL — ABNORMAL LOW (ref 8.9–10.3)
Chloride: 102 mmol/L (ref 98–111)
Creatinine, Ser: 0.96 mg/dL (ref 0.61–1.24)
GFR, Estimated: >60 mL/min (ref >60)
Glucose, Bld: 127 mg/dL — ABNORMAL HIGH (ref 70–99)
Phosphorus: 2.1 mg/dL — ABNORMAL LOW (ref 2.5–4.6)
Potassium: 3.5 mmol/L (ref 3.5–5.1)
Sodium: 135 mmol/L (ref 135–145)

## 2022-08-20 MED ORDER — INSULIN ASPART 100 UNIT/ML IJ SOLN
0.0000 [IU] | Freq: Three times a day (TID) | INTRAMUSCULAR | Status: DC
  Administered 2022-08-21: 11 [IU] via SUBCUTANEOUS
  Administered 2022-08-21: 15 [IU] via SUBCUTANEOUS

## 2022-08-20 MED ORDER — MELATONIN 3 MG PO TABS
3.0000 mg | ORAL_TABLET | Freq: Once | ORAL | Status: AC
  Administered 2022-08-20: 3 mg via ORAL
  Filled 2022-08-20: qty 1

## 2022-08-20 MED ORDER — METOCLOPRAMIDE HCL 5 MG PO TABS
10.0000 mg | ORAL_TABLET | Freq: Three times a day (TID) | ORAL | Status: DC
  Administered 2022-08-20 – 2022-08-21 (×4): 10 mg via ORAL
  Filled 2022-08-20 (×5): qty 2

## 2022-08-20 MED ORDER — INSULIN ASPART 100 UNIT/ML IJ SOLN
0.0000 [IU] | Freq: Every day | INTRAMUSCULAR | Status: DC
  Administered 2022-08-20: 5 [IU] via SUBCUTANEOUS

## 2022-08-20 MED ORDER — TECHNETIUM TC 99M SULFUR COLLOID
2.1000 | Freq: Once | INTRAVENOUS | Status: AC
  Administered 2022-08-20: 2.1 via INTRAVENOUS

## 2022-08-20 MED ORDER — INSULIN ASPART 100 UNIT/ML IJ SOLN: 4.0000 [IU] | Freq: Three times a day (TID) | INTRAMUSCULAR | Status: DC

## 2022-08-20 NOTE — Hospital Course (Signed)
PMH of type II DM, GERD, HTN, HLD, A-fib.  Present to the hospital with complaints of shortness of breath and back pain.  Found to have severe DKA as well as influenza A infection requiring admission to the ICU on 1/3. Hospital course complicated by oral candidiasis and intractable nausea and vomiting secondary to gastroparesis

## 2022-08-20 NOTE — Progress Notes (Signed)
PT Cancellation Note  Patient Details Name: Taylor Hughes MRN: [id number] DOB: 1971/06/15   Cancelled Treatment:    Reason Eval/Treat Not Completed: Patient at procedure or test/unavailable (pt off floor for imaging. Will follow.)   Philomena Doheny PT 08/20/2022  Acute Rehabilitation Services  Office [phone]

## 2022-08-20 NOTE — Progress Notes (Signed)
PT Cancellation Note  Patient Details Name: Taylor Hughes MRN: [id number] DOB: 06/04/1971   Cancelled Treatment:    Reason Eval/Treat Not Completed: Other (comment) (pt stated he's concerned about bills, and frustrated about not being able to eat and is not in the mood to walk right now. Will follow.)  Philomena Doheny PT 08/20/2022  Acute Rehabilitation Services  Office [phone]

## 2022-08-20 NOTE — Plan of Care (Signed)

## 2022-08-20 NOTE — Progress Notes (Signed)
[facility] Progress Note Patient: Taylor Hughes PXT:[id number] DOB: 05-09-1971 DOA: 08/13/2022  DOS: the patient was seen and examined on 08/20/2022  Brief hospital course: PMH of type II DM, GERD, HTN, HLD, A-fib.  Present to the hospital with complaints of shortness of breath and back pain.  Found to have severe DKA as well as influenza A infection requiring admission to the ICU on 1/3. Hospital course complicated by oral candidiasis and intractable nausea and vomiting secondary to gastroparesis Assessment and Plan: Severe DKA. Type 2 diabetes mellitus, poorly controlled with hyperglycemia with long-term insulin use with diabetic gastroparesis and diabetic neuropathy Hemoglobin A1c 11.9. Initially treated with IV insulin in the ICU. Currently anion gap closed. On basal bolus regimen. Monitor.  Diabetic gastroparesis. Patient underwent gastric emptying study which confirms delayed gastric emptying. Although not significant. Will treat with Reglan and regimen and monitor.  Influenza A infection. Treated with Tamiflu. Continue supportive measures. Monitor.  Oral candidiasis. Concern for esophageal contagiousness. Currently on IV Diflucan. Will continue for now.  Intractable nausea and vomiting. Likely combination of candidiasis as well as gastroparesis. If not able to tolerate diet will consider GI consultation.  Acute kidney injury. Baseline creatinine around 1.1.  On admission serum creatinine 2.97.  Currently improving to normal with IV fluid. Monitor.  Hypokalemia.  Hypophosphatemia. Replaced. Monitor.  Hypertension. Continue home regimen.  HLD. Continue statin.  Chronic diastolic dysfunction. Currently no volume overload. Monitor.  Obesity Body mass index is 37.64 kg/m.  Placing the pt at higher risk of poor outcomes.  Subjective: No nausea no vomiting.  Hungry.  Passing gas.  Had a bowel movement.  No abdominal pain.  Physical Exam: General: in Mild  distress, No Rash Cardiovascular: S1 and S2 Present, No Murmur Respiratory: Good respiratory effort, Bilateral Air entry present. No Crackles, No wheezes Abdomen: Bowel Sound present, No tenderness Extremities: No edema Neuro: Alert and oriented x3, no new focal deficit  Data Reviewed: I have Reviewed nursing notes, Vitals, and Lab results. Since last encounter, pertinent lab results CBC and BMP and gastric emptying study   . I have ordered test including CBC and BMP  .   Disposition: Status is: Inpatient Remains inpatient appropriate because: Need for diet tolerance  heparin injection 5,000 Units Start: 08/13/22 1400 SCDs Start: 08/13/22 6270   Family Communication: No one at bedside Level of care: Telemetry switch to MedSurg. Vitals:   08/19/22 1328 08/19/22 2008 08/19/22 2337 08/20/22 1341  BP: 132/79 (!) 174/101 (!) 148/88 (!) 145/88  Pulse: 87 87 91 88  Resp: '17 16 16 20  '$ Temp: 98 F (36.7 C) 98.6 F (37 C) 98.6 F (37 C) 98 F (36.7 C)  TempSrc:  Oral Oral Oral  SpO2: 97% 97% 97% 98%  Weight:      Height:         Author: Berle Mull, MD 08/20/2022 7:21 PM  Please look on [email or web address] to find out who is on call.

## 2022-08-20 NOTE — Inpatient Diabetes Management (Signed)
Inpatient Diabetes Program Recommendations  AACE/ADA: New Consensus Statement on Inpatient Glycemic Control (2015)  Target Ranges:  Prepandial:   less than 140 mg/dL      Peak postprandial:   less than 180 mg/dL (1-2 hours)      Critically ill patients:  140 - 180 mg/dL   Lab Results  Component Value Date   GLUCAP 139 (H) 08/20/2022   HGBA1C 11.9 (H) 08/14/2022    Review of Glycemic Control  Diabetes history: DM1 Outpatient Diabetes medications: Levemir 30 QD, Novolog 15 TID, Trulicity weekly Current orders for Inpatient glycemic control: Basaglar 15 QD, Novolog 0-15 TID with meals and 0-5 HS  HgbA1C - 11.9% Will need meal coverage insulin when consistently eating 75-100%.  Inpatient Diabetes Program Recommendations:    Novolog 4 units TID with meals if eating > 50%.  Follow closely and titrate insulin as needed  Will follow.   Thank you. Lorenda Peck, RD, LDN, [place] Inpatient Diabetes Coordinator [phone]

## 2022-08-21 ENCOUNTER — Other Ambulatory Visit: Payer: Self-pay | Admitting: Pharmacist

## 2022-08-21 ENCOUNTER — Other Ambulatory Visit: Payer: Self-pay

## 2022-08-21 LAB — RENAL FUNCTION PANEL
Albumin: 2.3 g/dL — ABNORMAL LOW (ref 3.5–5.0)
Anion gap: 10 (ref 5–15)
BUN: 15 mg/dL (ref 6–20)
CO2: 23 mmol/L (ref 22–32)
Calcium: 7.8 mg/dL — ABNORMAL LOW (ref 8.9–10.3)
Chloride: 101 mmol/L (ref 98–111)
Creatinine, Ser: 1.01 mg/dL (ref 0.61–1.24)
GFR, Estimated: >60 mL/min (ref >60)
Glucose, Bld: 400 mg/dL — ABNORMAL HIGH (ref 70–99)
Phosphorus: 2.1 mg/dL — ABNORMAL LOW (ref 2.5–4.6)
Potassium: 4 mmol/L (ref 3.5–5.1)
Sodium: 134 mmol/L — ABNORMAL LOW (ref 135–145)

## 2022-08-21 LAB — CBC WITH DIFFERENTIAL/PLATELET
Abs Immature Granulocytes: 0.02 10*3/uL (ref 0.00–0.07)
Basophils Absolute: 0 10*3/uL (ref 0.0–0.1)
Basophils Relative: 1 %
Eosinophils Absolute: 0 10*3/uL (ref 0.0–0.5)
Eosinophils Relative: 0 %
HCT: 34 % — ABNORMAL LOW (ref 39.0–52.0)
Hemoglobin: 10.8 g/dL — ABNORMAL LOW (ref 13.0–17.0)
Immature Granulocytes: 1 %
Lymphocytes Relative: 35 %
Lymphs Abs: 1.5 10*3/uL (ref 0.7–4.0)
MCH: 27.9 pg (ref 26.0–34.0)
MCHC: 31.8 g/dL (ref 30.0–36.0)
MCV: 87.9 fL (ref 80.0–100.0)
Monocytes Absolute: 0.9 10*3/uL (ref 0.1–1.0)
Monocytes Relative: 21 %
Neutro Abs: 1.9 10*3/uL (ref 1.7–7.7)
Neutrophils Relative %: 42 %
Platelets: 286 10*3/uL (ref 150–400)
RBC: 3.87 MIL/uL — ABNORMAL LOW (ref 4.22–5.81)
RDW: 14.7 % (ref 11.5–15.5)
WBC: 4.3 10*3/uL (ref 4.0–10.5)
nRBC: 0 % (ref 0.0–0.2)

## 2022-08-21 LAB — GLUCOSE, CAPILLARY
Glucose-Capillary: 352 mg/dL — ABNORMAL HIGH (ref 70–99)
Glucose-Capillary: 302 mg/dL — ABNORMAL HIGH (ref 70–99)

## 2022-08-21 MED ORDER — INSULIN LISPRO (1 UNIT DIAL) 100 UNIT/ML (KWIKPEN)
PEN_INJECTOR | SUBCUTANEOUS | 0 refills | Status: DC
  Filled 2022-08-21: qty 12, 33d supply, fill #0

## 2022-08-21 MED ORDER — FLUCONAZOLE 100 MG PO TABS
100.0000 mg | ORAL_TABLET | Freq: Every day | ORAL | 0 refills | Status: AC
  Filled 2022-08-21: qty 8, 8d supply, fill #0

## 2022-08-21 MED ORDER — INSULIN GLARGINE-YFGN 100 UNIT/ML ~~LOC~~ SOLN
30.0000 [IU] | Freq: Every day | SUBCUTANEOUS | Status: DC
  Administered 2022-08-21: 30 [IU] via SUBCUTANEOUS
  Filled 2022-08-21: qty 0.3

## 2022-08-21 MED ORDER — INSULIN ASPART 100 UNIT/ML IJ SOLN
8.0000 [IU] | Freq: Three times a day (TID) | INTRAMUSCULAR | Status: DC
  Administered 2022-08-21 (×2): 8 [IU] via SUBCUTANEOUS

## 2022-08-21 MED ORDER — GLUCERNA SHAKE PO LIQD
237.0000 mL | Freq: Three times a day (TID) | ORAL | 0 refills | Status: DC
  Filled 2022-08-21: qty 10000, 14d supply, fill #0

## 2022-08-21 MED ORDER — METOCLOPRAMIDE HCL 10 MG PO TABS
10.0000 mg | ORAL_TABLET | Freq: Three times a day (TID) | ORAL | 0 refills | Status: DC
  Filled 2022-08-21: qty 21, 7d supply, fill #0

## 2022-08-21 NOTE — Progress Notes (Signed)
DC order recd. IV dc per protocol, pt tol well. DC instructions given to pt who voices understanding of follow up and medications. Pt voices no concerns or c/o. No distress noted. Pt travels by wheelchair to discharge lounge. All belongings accompany pt.

## 2022-08-21 NOTE — Progress Notes (Signed)
PT Cancellation Note  Patient Details Name: Taylor Hughes MRN: [id number] DOB: [birth year]   Cancelled Treatment:    Reason Eval/Treat Not Completed: Other (comment) (pt reports he's [place] today and does not wish to do PT at present.)   Philomena Doheny PT 08/21/2022  [place]  Office [phone]

## 2022-08-23 NOTE — Discharge Summary (Signed)
Physician Discharge Summary   Patient: Taylor Hughes MRN: [id number] DOB: 1971-03-10  Admit date:     08/13/2022  Discharge date: 08/21/2022  Discharge Physician: Berle Mull  PCP: Charlott Rakes, MD  Recommendations at discharge: Follow-up with PCP in 1 week.   Follow-up Information     Charlott Rakes, MD. Schedule an appointment as soon as possible for a visit in 1 week(s).   Specialty: Family Medicine Contact information: [place] [id number] [phone]                Discharge Diagnoses: Active Problems:   AKI (acute kidney injury) ([place])   DKA (diabetic ketoacidosis) ([place])   Influenza A  Hospital Course: PMH of type II DM, GERD, HTN, HLD, A-fib.  Present to the hospital with complaints of shortness of breath and back pain.  Found to have severe DKA as well as influenza A infection requiring admission to the ICU on 1/3. Hospital course complicated by oral candidiasis and intractable nausea and vomiting secondary to gastroparesis Assessment and Plan  Severe DKA. Type 2 diabetes mellitus, poorly controlled with hyperglycemia with long-term insulin use with diabetic gastroparesis and diabetic neuropathy Hemoglobin A1c 11.9. Initially treated with IV insulin in the ICU. Currently anion gap closed. On basal bolus regimen. Monitor.   Diabetic gastroparesis. Patient underwent gastric emptying study which confirms delayed gastric emptying. Although not significant. Will treat with Reglan and regimen and monitor.   Influenza A infection. Treated with Tamiflu. Continue supportive measures. Monitor.   Oral candidiasis. Concern for esophageal candidiasis. Currently on IV Diflucan.  Switch to oral and continue.   Intractable nausea and vomiting. Likely combination of candidiasis as well as gastroparesis. Tolerating oral diet with Reglan. If has recurrence would recommend GI referral.  Acute kidney injury. Baseline creatinine  around 1.1.  On admission serum creatinine 2.97.  Currently improving to normal with IV fluid. Monitor.   Hypokalemia.  Hypophosphatemia. Replaced. Monitor.   Hypertension. Continue home regimen.   HLD. Continue statin.   Chronic diastolic dysfunction. Currently no volume overload. Monitor.   Obesity Body mass index is 37.64 kg/m.  Placing the pt at higher risk of poor outcomes.   Consultants:  none  Procedures performed:  none  DISCHARGE MEDICATION: Allergies as of 08/21/2022   No Known Allergies      Medication List     TAKE these medications    acetaminophen 325 MG tablet Commonly known as: TYLENOL Take 650 mg by mouth every 6 (six) hours as needed for mild pain, fever or headache.   albuterol 108 (90 Base) MCG/ACT inhaler Commonly known as: VENTOLIN HFA Inhale 2 puffs into the lungs every 6 (six) hours as needed for wheezing or shortness of breath.   aspirin EC 81 MG tablet Take 81 mg by mouth daily. Swallow whole.   atorvastatin 20 MG tablet Commonly known as: LIPITOR Take 1 tablet (20 mg total) by mouth daily.   Basaglar KwikPen 100 UNIT/ML Inject 30 Units into the skin daily.   feeding supplement (GLUCERNA SHAKE) Liqd Take 237 mLs by mouth 3 (three) times daily between meals.   fluconazole 100 MG tablet Commonly known as: Diflucan Take 1 tablet (100 mg total) by mouth daily for 8 days.   furosemide 20 MG tablet Commonly known as: LASIX Take 1 tablet (20 mg total) by mouth daily.   gabapentin 300 MG capsule Commonly known as: NEURONTIN Take 1 capsule (300 mg total) by mouth 2 (two) times daily.  Insulin Pen Needle 32G X 5 MM Misc Use with levemir pen as prescribed   metoCLOPramide 10 MG tablet Commonly known as: REGLAN Take 1 tablet (10 mg total) by mouth 3 (three) times daily before meals for 7 days.   metoprolol tartrate 50 MG tablet Commonly known as: LOPRESSOR TAKE 1 TABLET (50 MG TOTAL) BY MOUTH 2 (TWO) TIMES DAILY. What  changed: how much to take   omeprazole 40 MG capsule Commonly known as: PRILOSEC Take 1 capsule (40 mg total) by mouth daily. What changed:  when to take this reasons to take this   True Metrix Blood Glucose Test test strip Generic drug: glucose blood Use as directed   True Metrix Meter w/Device Kit Use to check blood sugar three times daily.   TRUEplus Lancets 28G Misc Use to check blood sugar daily.   Trulicity 1.5 OE/4.2PN Sopn Generic drug: Dulaglutide Inject 1.5 mg into the skin once a week.       Disposition: Home Diet recommendation: Carb modified diet  Discharge Exam: Vitals:   08/19/22 2337 08/20/22 1341 08/20/22 2119 08/21/22 0539  BP: (!) 148/88 (!) 145/88 (!) 147/91 124/79  Pulse: 91 88 (!) 101 90  Resp: '16 20 18 18  '$ Temp: 98.6 F (37 C) 98 F (36.7 C) 98.8 F (37.1 C) 98.3 F (36.8 C)  TempSrc: Oral Oral Oral Oral  SpO2: 97% 98% 96% 96%  Weight:      Height:       General: Appear in no distress; no visible Abnormal Neck Mass Or lumps, Conjunctiva normal Cardiovascular: S1 and S2 Present, no Murmur, Respiratory: good respiratory effort, Bilateral Air entry present and CTA, no Crackles, no wheezes Abdomen: Bowel Sound present, Non tender  Extremities: no Pedal edema Neurology: alert and oriented to time, place, and person  Filed Weights   08/17/22 0451 08/18/22 0500 08/19/22 0500  Weight: 121.8 kg 122.8 kg 122.4 kg   Condition at discharge: stable  The results of significant diagnostics from this hospitalization (including imaging, microbiology, ancillary and laboratory) are listed below for reference.   Imaging Studies: NM GASTRIC EMPTYING  Result Date: 08/20/2022 CLINICAL DATA:  Gastroparesis. EXAM: NUCLEAR MEDICINE GASTRIC EMPTYING SCAN TECHNIQUE: After oral ingestion of radiolabeled meal, sequential abdominal images were obtained for 4 hours. Percentage of activity emptying the stomach was calculated at 1 hour, 2 hour, 3 hour, and 4 hours.  RADIOPHARMACEUTICALS:  2.1 mCi Tc-73msulfur colloid in standardized meal COMPARISON:  None Available. FINDINGS: Expected location of the stomach in the left upper quadrant. Ingested meal empties the stomach gradually over the course of the study. 30.3 emptied at 1 hr ( normal >= 10%) 42.3 emptied at 2 hr ( normal >= 40%) Fifty-three emptied at 3 hr ( normal >= 70%) Sixty-seven emptied at 4 hr ( normal >= 90%) IMPRESSION: Delayed gastric emptying study at 3 and 4 hours. More normal at 1 and 2 hours. Electronically Signed   By: AJill SideM.D.   On: 08/20/2022 14:37   ECHOCARDIOGRAM COMPLETE  Result Date: 08/15/2022    ECHOCARDIOGRAM REPORT   Patient Name:   Taylor LEINBACHDate of Exam: 08/15/2022 Medical Rec #:  [id number]   Height:       71.0 in Accession #:    [id number]  Weight:       263.4 lb Date of Birth:  7May 08, 1972    BSA:          2.371 m Patient Age:    52  years     BP:           163/78 mmHg Patient Gender: M            HR:           86 bpm. Exam Location:  Inpatient Procedure: 2D Echo and Intracardiac Opacification Agent Indications:    Cardiomegaly  History:        Patient has prior history of Echocardiogram examinations, most                 recent 07/30/2018. Risk Factors:Hypertension and Diabetes.  Sonographer:    Harvie Junior Referring Phys: [id number] [place]  Sonographer Comments: Technically difficult study due to poor echo windows and patient is obese. Image acquisition challenging due to patient body habitus and Image acquisition challenging due to respiratory motion. IMPRESSIONS  1. Study not well visualized. Left ventricular ejection fraction, by estimation, is 60 to 65%. The left ventricle has normal function. The left ventricle has no regional wall motion abnormalities. Left ventricular diastolic parameters are consistent with Grade I diastolic dysfunction (impaired relaxation).  2. Right ventricular systolic function is normal. The right ventricular size is normal.  3. No  evidence of mitral valve regurgitation.  4. Aortic valve regurgitation is not visualized.  5. The inferior vena cava is normal in size with greater than 50% respiratory variability, suggesting right atrial pressure of 3 mmHg. FINDINGS  Left Ventricle: Study not well visualized. Left ventricular ejection fraction, by estimation, is 60 to 65%. The left ventricle has normal function. The left ventricle has no regional wall motion abnormalities. Definity contrast agent was given IV to delineate the left ventricular endocardial borders. The left ventricular internal cavity size was normal in size. There is no left ventricular hypertrophy. Left ventricular diastolic parameters are consistent with Grade I diastolic dysfunction (impaired relaxation). Right Ventricle: The right ventricular size is normal. Right ventricular systolic function is normal. Left Atrium: Left atrial size was normal in size. Right Atrium: Right atrial size was normal in size. Pericardium: There is no evidence of pericardial effusion. Mitral Valve: No evidence of mitral valve regurgitation. Tricuspid Valve: Tricuspid valve regurgitation is not demonstrated. Aortic Valve: Aortic valve regurgitation is not visualized. Aortic valve mean gradient measures 4.0 mmHg. Aortic valve peak gradient measures 8.1 mmHg. Aortic valve area, by VTI measures 2.88 cm. Pulmonic Valve: Pulmonic valve regurgitation is not visualized. Aorta: The aortic root and ascending aorta are structurally normal, with no evidence of dilitation. Venous: The inferior vena cava is normal in size with greater than 50% respiratory variability, suggesting right atrial pressure of 3 mmHg. IAS/Shunts: The interatrial septum was not well visualized.  LEFT VENTRICLE PLAX 2D LVIDd:         4.50 cm      Diastology LVIDs:         3.10 cm      LV e' medial:    7.62 cm/s LV PW:         0.90 cm      LV E/e' medial:  10.9 LV IVS:        0.90 cm      LV e' lateral:   11.90 cm/s LVOT diam:     2.20 cm       LV E/e' lateral: 6.9 LV SV:         68 LV SV Index:   29 LVOT Area:     3.80 cm  LV Volumes (MOD) LV vol d, MOD A2C:  139.5 ml LV vol d, MOD A4C: 134.5 ml LV vol s, MOD A2C: 68.7 ml LV vol s, MOD A4C: 65.1 ml LV SV MOD A2C:     70.8 ml LV SV MOD A4C:     134.5 ml LV SV MOD BP:      72.9 ml RIGHT VENTRICLE RV Basal diam:  3.50 cm RV Mid diam:    2.80 cm RV S prime:     16.40 cm/s TAPSE (M-mode): 2.1 cm LEFT ATRIUM             Index        RIGHT ATRIUM           Index LA diam:        3.00 cm 1.27 cm/m   RA Area:     16.70 cm LA Vol (A2C):   47.0 ml 19.83 ml/m  RA Volume:   47.80 ml  20.16 ml/m LA Vol (A4C):   32.3 ml 13.63 ml/m LA Biplane Vol: 42.0 ml 17.72 ml/m  AORTIC VALVE                    PULMONIC VALVE AV Area (Vmax):    2.76 cm     PV Vmax:       1.19 m/s AV Area (Vmean):   2.89 cm     PV Peak grad:  5.7 mmHg AV Area (VTI):     2.88 cm AV Vmax:           142.00 cm/s AV Vmean:          99.300 cm/s AV VTI:            0.236 m AV Peak Grad:      8.1 mmHg AV Mean Grad:      4.0 mmHg LVOT Vmax:         103.00 cm/s LVOT Vmean:        75.400 cm/s LVOT VTI:          0.179 m LVOT/AV VTI ratio: 0.76  AORTA Ao Root diam: 3.70 cm MITRAL VALVE MV Area (PHT): 3.48 cm     SHUNTS MV Decel Time: 218 msec     Systemic VTI:  0.18 m MV E velocity: 82.70 cm/s   Systemic Diam: 2.20 cm MV A velocity: 104.00 cm/s MV E/A ratio:  0.80 Mary Scientist, physiological signed by Phineas Inches Signature Date/Time: 08/15/2022/1:50:59 PM    Final    DG Chest Port 1 View  Result Date: 08/14/2022 CLINICAL DATA:  Influenza a, DKA. EXAM: PORTABLE CHEST 1 VIEW COMPARISON:  08/13/2022. FINDINGS: The heart is borderline enlarged and mediastinal contours are within normal limits. The pulmonary vasculature is prominent. Lung volumes are low. No consolidation, effusion, or pneumothorax. No acute osseous abnormality IMPRESSION: Borderline cardiomegaly with mildly distended pulmonary vasculature. Electronically Signed   By: Brett Fairy M.D.    On: 08/14/2022 04:18   DG Chest Port 1 View  Result Date: 08/13/2022 CLINICAL DATA:  Shortness of breath EXAM: PORTABLE CHEST 1 VIEW COMPARISON:  06/06/2021 FINDINGS: Normal heart size and mediastinal contours. No acute infiltrate or edema. No effusion or pneumothorax. No acute osseous findings. Remote posterior right rib fractures. IMPRESSION: No active disease. Electronically Signed   By: Jorje Guild M.D.   On: 08/13/2022 06:08    Microbiology: Results for orders placed or performed during the hospital encounter of 08/13/22  Resp panel by RT-PCR (RSV, Flu A&B, Covid) Anterior Nasal Swab     Status:  Abnormal   Collection Time: 08/13/22  6:11 AM   Specimen: Anterior Nasal Swab  Result Value Ref Range Status   SARS Coronavirus 2 by RT PCR NEGATIVE NEGATIVE Final    Comment: (NOTE) SARS-CoV-2 target nucleic acids are NOT DETECTED.  The SARS-CoV-2 RNA is generally detectable in upper respiratory specimens during the acute phase of infection. The lowest concentration of SARS-CoV-2 viral copies this assay can detect is 138 copies/mL. A negative result does not preclude SARS-Cov-2 infection and should not be used as the sole basis for treatment or other patient management decisions. A negative result may occur with  improper specimen collection/handling, submission of specimen other than nasopharyngeal swab, presence of viral mutation(s) within the areas targeted by this assay, and inadequate number of viral copies(<138 copies/mL). A negative result must be combined with clinical observations, patient history, and epidemiological information. The expected result is Negative.  Fact Sheet for Patients:  EntrepreneurPulse.com.au  Fact Sheet for Healthcare Providers:  IncredibleEmployment.be  This test is no t yet approved or cleared by the [place] FDA and  has been authorized for detection and/or diagnosis of SARS-CoV-2 by FDA under an Emergency  Use Authorization (EUA). This EUA will remain  in effect (meaning this test can be used) for the duration of the COVID-19 declaration under Section 564(b)(1) of the Act, 21 U.S.C.section 360bbb-3(b)(1), unless the authorization is terminated  or revoked sooner.       Influenza A by PCR POSITIVE (A) NEGATIVE Final   Influenza B by PCR NEGATIVE NEGATIVE Final    Comment: (NOTE) The Xpert Xpress SARS-CoV-2/FLU/RSV plus assay is intended as an aid in the diagnosis of influenza from Nasopharyngeal swab specimens and should not be used as a sole basis for treatment. Nasal washings and aspirates are unacceptable for Xpert Xpress SARS-CoV-2/FLU/RSV testing.  Fact Sheet for Patients: EntrepreneurPulse.com.au  Fact Sheet for Healthcare Providers: IncredibleEmployment.be  This test is not yet approved or cleared by the [place] FDA and has been authorized for detection and/or diagnosis of SARS-CoV-2 by FDA under an Emergency Use Authorization (EUA). This EUA will remain in effect (meaning this test can be used) for the duration of the COVID-19 declaration under Section 564(b)(1) of the Act, 21 U.S.C. section 360bbb-3(b)(1), unless the authorization is terminated or revoked.     Resp Syncytial Virus by PCR NEGATIVE NEGATIVE Final    Comment: (NOTE) Fact Sheet for Patients: EntrepreneurPulse.com.au  Fact Sheet for Healthcare Providers: IncredibleEmployment.be  This test is not yet approved or cleared by the [place] FDA and has been authorized for detection and/or diagnosis of SARS-CoV-2 by FDA under an Emergency Use Authorization (EUA). This EUA will remain in effect (meaning this test can be used) for the duration of the COVID-19 declaration under Section 564(b)(1) of the Act, 21 U.S.C. section 360bbb-3(b)(1), unless the authorization is terminated or revoked.  Performed at [facility], [place] [address], [place], [place] [address]    Labs: CBC: Recent Labs  Lab 08/17/22 0551 08/18/22 0557 08/19/22 0543 08/20/22 0627 08/21/22 0620  WBC 4.2 4.4 4.1 4.2 4.3  NEUTROABS 3.1 2.4 2.0 1.9 1.9  HGB 12.3* 12.1* 11.9* 11.1* 10.8*  HCT 37.9* 37.1* 37.2* 35.1* 34.0*  MCV 87.7 87.9 87.1 90.0 87.9  PLT 273 288 299 280 263   Basic Metabolic Panel: Recent Labs  Lab 08/17/22 0551 08/17/22 0743 08/18/22 0557 08/19/22 0543 08/20/22 0627 08/21/22 0620  NA 141  --  137 139 135 134*  K 2.7*  --  2.9* 3.0* 3.5 4.0  CL 103  --  101 104 102 101  CO2 26  --  '27 27 26 23  '$ GLUCOSE 223*  --  257* 62* 127* 400*  BUN 20  --  '19 12 13 15  '$ CREATININE 1.00  --  0.99 0.81 0.96 1.01  CALCIUM 8.3*  --  7.8* 8.0* 7.6* 7.8*  MG  --  1.9 1.7 1.7  --   --   PHOS  --  3.6 2.7 2.2* 2.1* 2.1*   Liver Function Tests: Recent Labs  Lab 08/19/22 0543 08/20/22 0627 08/21/22 0620  ALBUMIN 1.9* 1.9* 2.3*   CBG: Recent Labs  Lab 08/20/22 1626 08/20/22 2120 08/20/22 2123 08/21/22 0738 08/21/22 1123  GLUCAP 205* 455* 394* 352* 302*    Discharge time spent: greater than 30 minutes.  Signed: Berle Mull, MD [facility] 08/21/2022

## 2022-08-25 ENCOUNTER — Telehealth: Payer: Self-pay

## 2022-08-25 NOTE — Telephone Encounter (Signed)
Transition Care Management Unsuccessful Follow-up Telephone Call  Date of discharge and from where:  08/21/2022, [facility]  Attempts:  1st Attempt  Reason for unsuccessful TCM follow-up call:  Left voice message on [phone], call back requested

## 2022-08-26 ENCOUNTER — Telehealth: Payer: Self-pay

## 2022-08-26 NOTE — Telephone Encounter (Signed)
Transition Care Management Unsuccessful Follow-up Telephone Call  Date of discharge and from where:  08/21/2022, [facility]  Attempts:  2nd Attempt  Reason for unsuccessful TCM follow-up call:  Left voice message on [phone], call back requested

## 2022-08-27 ENCOUNTER — Telehealth: Payer: Self-pay

## 2022-08-27 NOTE — Telephone Encounter (Signed)
Transition Care Management Follow-up Telephone Call Date of discharge and from where: 08/21/2022, [facility]  How have you been since you were released from the hospital? The patient stated he is doing pretty good. He was at work and said he knew why I was calling and he wanted to make it a quick call because his boss was on him.  He did not have any questions/ concerns at this time  Any questions or concerns? No  Items Reviewed: Did the pt receive and understand the discharge instructions provided? Yes  Medications obtained and verified? Yes - he said he has all medications as well as a glucometer.  Other? No  Any new allergies since your discharge? No  Dietary orders reviewed? No Do you have support at home?  Not addressed  Home Care and Equipment/Supplies: Were home health services ordered? no If so, what is the name of the agency? N/a  Has the agency set up a time to come to the patient's home? not applicable Were any new equipment or medical supplies ordered?  No What is the name of the medical supply agency? N/a Were you able to get the supplies/equipment? not applicable Do you have any questions related to the use of the equipment or supplies? No  Functional Questionnaire: (I = Independent and D = Dependent) ADLs: independent   Follow up appointments reviewed:  PCP Hospital f/u appt confirmed? Yes  Scheduled to see Dr Margarita Rana - 09/08/2022.  [facility] f/u appt confirmed?  None scheduled at this time .   Are transportation arrangements needed? No  If their condition worsens, is the pt aware to call PCP or go to the Emergency Dept.? Yes Was the patient provided with contact information for the PCP's office or ED? Yes Was to pt encouraged to call back with questions or concerns? Yes

## 2022-09-02 ENCOUNTER — Other Ambulatory Visit: Payer: Self-pay

## 2022-09-05 ENCOUNTER — Other Ambulatory Visit: Payer: Self-pay

## 2022-09-08 ENCOUNTER — Encounter: Payer: Self-pay | Admitting: Family Medicine

## 2022-09-08 ENCOUNTER — Other Ambulatory Visit: Payer: Self-pay

## 2022-09-08 ENCOUNTER — Ambulatory Visit: Payer: Self-pay | Attending: Family Medicine | Admitting: Family Medicine

## 2022-09-08 ENCOUNTER — Other Ambulatory Visit: Payer: Self-pay | Admitting: Pharmacist

## 2022-09-08 VITALS — BP 161/90 | HR 91 | Temp 98.7°F | Ht 71.0 in | Wt 293.4 lb

## 2022-09-08 DIAGNOSIS — I152 Hypertension secondary to endocrine disorders: Secondary | ICD-10-CM

## 2022-09-08 DIAGNOSIS — E1159 Type 2 diabetes mellitus with other circulatory complications: Secondary | ICD-10-CM

## 2022-09-08 DIAGNOSIS — E1142 Type 2 diabetes mellitus with diabetic polyneuropathy: Secondary | ICD-10-CM

## 2022-09-08 DIAGNOSIS — I872 Venous insufficiency (chronic) (peripheral): Secondary | ICD-10-CM

## 2022-09-08 DIAGNOSIS — N529 Male erectile dysfunction, unspecified: Secondary | ICD-10-CM

## 2022-09-08 DIAGNOSIS — F32A Depression, unspecified: Secondary | ICD-10-CM | POA: Insufficient documentation

## 2022-09-08 DIAGNOSIS — Z794 Long term (current) use of insulin: Secondary | ICD-10-CM

## 2022-09-08 DIAGNOSIS — F331 Major depressive disorder, recurrent, moderate: Secondary | ICD-10-CM

## 2022-09-08 MED ORDER — VALSARTAN-HYDROCHLOROTHIAZIDE 160-25 MG PO TABS
1.0000 | ORAL_TABLET | Freq: Every day | ORAL | 1 refills | Status: DC
  Filled 2022-09-08: qty 30, 30d supply, fill #0
  Filled 2022-10-10: qty 30, 30d supply, fill #1
  Filled 2022-11-07: qty 30, 30d supply, fill #2
  Filled 2022-12-25: qty 30, 30d supply, fill #3
  Filled 2023-01-19: qty 30, 30d supply, fill #4

## 2022-09-08 MED ORDER — BUPROPION HCL ER (XL) 150 MG PO TB24
150.0000 mg | ORAL_TABLET | Freq: Every day | ORAL | 1 refills | Status: DC
  Filled 2022-09-08: qty 30, 30d supply, fill #0
  Filled 2022-10-10: qty 30, 30d supply, fill #1
  Filled 2022-11-07: qty 30, 30d supply, fill #2
  Filled 2023-01-19: qty 30, 30d supply, fill #3

## 2022-09-08 MED ORDER — OZEMPIC (0.25 OR 0.5 MG/DOSE) 2 MG/1.5ML ~~LOC~~ SOPN
5.0000 mg | PEN_INJECTOR | SUBCUTANEOUS | 6 refills | Status: DC
  Filled 2022-09-08: qty 2, 7d supply, fill #0

## 2022-09-08 MED ORDER — INSULIN LISPRO (1 UNIT DIAL) 100 UNIT/ML (KWIKPEN)
PEN_INJECTOR | SUBCUTANEOUS | 3 refills | Status: DC
  Filled 2022-09-08: qty 12, fill #0
  Filled 2022-10-27: qty 12, 33d supply, fill #0
  Filled 2022-12-08: qty 12, 33d supply, fill #1
  Filled 2023-01-09 (×2): qty 12, 33d supply, fill #2

## 2022-09-08 MED ORDER — BASAGLAR KWIKPEN 100 UNIT/ML ~~LOC~~ SOPN
30.0000 [IU] | PEN_INJECTOR | Freq: Every day | SUBCUTANEOUS | 2 refills | Status: DC
  Filled 2022-09-08 (×2): qty 9, 30d supply, fill #0
  Filled 2022-10-06: qty 15, 50d supply, fill #0

## 2022-09-08 MED ORDER — OZEMPIC (0.25 OR 0.5 MG/DOSE) 2 MG/3ML ~~LOC~~ SOPN
0.5000 mg | PEN_INJECTOR | SUBCUTANEOUS | 1 refills | Status: DC
  Filled 2022-09-08 – 2022-09-09 (×2): qty 3, 28d supply, fill #0
  Filled 2022-10-06 (×2): qty 3, 28d supply, fill #1

## 2022-09-08 NOTE — Progress Notes (Signed)
Discuss erectile dysfunction
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
## 2022-09-08 NOTE — Patient Instructions (Signed)

## 2022-09-09 ENCOUNTER — Telehealth: Payer: Self-pay | Admitting: Emergency Medicine

## 2022-09-09 ENCOUNTER — Other Ambulatory Visit: Payer: Self-pay

## 2022-09-09 NOTE — Telephone Encounter (Signed)
Calling and verifying what's going on with his insurance. Coordinating with Claiborne Billings in pharmacy and will reach out to patient once we have an answer.

## 2022-09-09 NOTE — Telephone Encounter (Signed)
Copied from [place] [id number]. Topic: General - Other >> Sep 09, 2022  9:49 AM Everette C wrote: Reason for CRM: The patient shares that their insulin is $961 without their insurance being included   The patient would like the practice [phone]   Reference ID [id number]  Please contact the patient further when possible

## 2022-09-12 ENCOUNTER — Other Ambulatory Visit: Payer: Self-pay

## 2022-09-15 ENCOUNTER — Other Ambulatory Visit: Payer: Self-pay

## 2022-09-22 ENCOUNTER — Other Ambulatory Visit: Payer: Self-pay

## 2022-09-23 ENCOUNTER — Other Ambulatory Visit: Payer: Self-pay

## 2022-09-29 ENCOUNTER — Telehealth: Payer: Self-pay

## 2022-09-29 NOTE — Telephone Encounter (Signed)
Patient attempted to be outreached by Levi Aland, PharmD Candidate on 09/29/22 to discuss hypertension. Left voicemail for patient to return our call at their convenience at [phone].   Levi Aland, PharmD Candidate, Class of [phone]  [place], [place].D. PGY-2 Ambulatory Care Pharmacy Resident 09/29/2022 10:59 AM

## 2022-09-30 NOTE — Telephone Encounter (Signed)
Patient attempted to be outreached by Donney Rankins, PharmD Candidate on 09/30/2022 to discuss hypertension. Left voicemail for patient to return our call at their convenience at [phone].   [place] of Pharmacy  PharmD Candidate 2024   Maryan Puls, PharmD PGY-1 [facility] Pharmacy Resident

## 2022-10-01 ENCOUNTER — Other Ambulatory Visit: Payer: Self-pay

## 2022-10-01 ENCOUNTER — Ambulatory Visit: Payer: Self-pay

## 2022-10-01 NOTE — Telephone Encounter (Signed)
3rd attempt, Patient called, left VM to return the call to the office to discuss symptoms with a nurse. Unable to reach patient after 3 attempts by [facility] NT, routing to the provider for resolution per protocol.

## 2022-10-01 NOTE — Telephone Encounter (Signed)
2nd attempt, Patient called, left VM to return the call to the office to discuss symptoms with a nurse.

## 2022-10-01 NOTE — Telephone Encounter (Signed)
Patient called, left VM to return the call to the office to discuss symptoms with a nurse.  Summary: humalog med not  working at times   Patient called in states humalog med doesn't seem to be working, his Blood sugar is at 250 now he says,but in the mornings its at 300 for the past 2 days

## 2022-10-02 NOTE — Telephone Encounter (Signed)
Patient attempted to be outreached by Junius Finner, PharmD Candidate on 10/02/2022 to discuss hypertension. Left voicemail for patient to return our call at their convenience at [phone].   [place] of Pharmacy  PharmD Candidate 2024   Maryan Puls, PharmD PGY-1 [facility] Pharmacy Resident

## 2022-10-03 ENCOUNTER — Other Ambulatory Visit: Payer: Self-pay

## 2022-10-03 NOTE — Telephone Encounter (Signed)
Spoke with patient . Patient offered earlier appointment so that his concerns with his insulin can be addressed. Patient deferred stating the he would wait and spoke we PCP on 10/10/2022

## 2022-10-06 ENCOUNTER — Other Ambulatory Visit: Payer: Self-pay

## 2022-10-06 ENCOUNTER — Other Ambulatory Visit: Payer: Self-pay | Admitting: Family Medicine

## 2022-10-06 NOTE — Telephone Encounter (Signed)
Patient attempted to be outreached by Levi Aland, PharmD Candidate on 10/06/22 to discuss hypertension. Left voicemail for patient to return our call at their convenience at [phone].   Levi Aland, PharmD Candidate, Class of 2024  Saunders E. Marsh, PharmD PGY-1 [facility] Pharmacy Resident

## 2022-10-07 ENCOUNTER — Other Ambulatory Visit: Payer: Self-pay

## 2022-10-07 MED ORDER — ALBUTEROL SULFATE HFA 108 (90 BASE) MCG/ACT IN AERS
2.0000 | INHALATION_SPRAY | Freq: Four times a day (QID) | RESPIRATORY_TRACT | 0 refills | Status: DC | PRN
  Filled 2022-10-07: qty 6.7, 25d supply, fill #0

## 2022-10-07 MED ORDER — TRUE METRIX BLOOD GLUCOSE TEST VI STRP
1.0000 | ORAL_STRIP | 0 refills | Status: DC
  Filled 2022-10-07: qty 100, 33d supply, fill #0

## 2022-10-07 NOTE — Telephone Encounter (Signed)
Requested Prescriptions  Pending Prescriptions Disp Refills   glucose blood (TRUE METRIX BLOOD GLUCOSE TEST) test strip 100 strip 0    Sig: Use as directed     Endocrinology: Diabetes - Testing Supplies Passed - 10/06/2022  9:09 AM      Passed - Valid encounter within last 12 months    Recent Outpatient Visits           4 weeks ago Type 2 diabetes mellitus with diabetic polyneuropathy, with long-term current use of insulin ([place])   [place], [place], MD   5 months ago Type 2 diabetes mellitus with diabetic polyneuropathy, with long-term current use of insulin [facility])   [place], [place] L, RPH-CPP   6 months ago Type 2 diabetes mellitus with diabetic polyneuropathy, with long-term current use of insulin [facility])   [place], [place] L, RPH-CPP   7 months ago Type 2 diabetes mellitus with diabetic polyneuropathy, with long-term current use of insulin ([place])   [place], [place], MD   10 months ago Type 2 diabetes mellitus with other specified complication, with long-term current use of insulin [facility])   [place], [place], [place]       Future Appointments             In 3 days Daisy Blossom, Jarome Matin, [place]             albuterol (VENTOLIN HFA) 108 (90 Base) MCG/ACT inhaler 6.7 g 0    Sig: Inhale 2 puffs into the lungs every 6 (six) hours as needed for wheezing or shortness of breath.     Pulmonology:  Beta Agonists 2 Failed - 10/06/2022  9:09 AM      Failed - Last BP in normal range    BP Readings from Last 1 Encounters:  09/08/22 (!) 161/90         Passed - Last Heart Rate in normal range    Pulse Readings from Last 1 Encounters:  09/08/22 91         Passed - Valid encounter within last 12 months     Recent Outpatient Visits           4 weeks ago Type 2 diabetes mellitus with diabetic polyneuropathy, with long-term current use of insulin ([place])   [place], [place], MD   5 months ago Type 2 diabetes mellitus with diabetic polyneuropathy, with long-term current use of insulin [facility])   [place], [place] L, RPH-CPP   6 months ago Type 2 diabetes mellitus with diabetic polyneuropathy, with long-term current use of insulin [facility])   [place], [place] L, RPH-CPP   7 months ago Type 2 diabetes mellitus with diabetic polyneuropathy, with long-term current use of insulin ([place])   [place], [place], MD   10 months ago Type 2 diabetes mellitus with other specified complication, with long-term current use of insulin [facility])   [place], Dionne Bucy, [place]       Future Appointments             In 3 days Tresa Endo, RPH-CPP [facility]

## 2022-10-09 ENCOUNTER — Other Ambulatory Visit: Payer: Self-pay

## 2022-10-10 ENCOUNTER — Encounter: Payer: Self-pay | Admitting: Pharmacist

## 2022-10-10 ENCOUNTER — Ambulatory Visit: Payer: Self-pay | Attending: Family Medicine | Admitting: Pharmacist

## 2022-10-10 ENCOUNTER — Other Ambulatory Visit: Payer: Self-pay

## 2022-10-10 ENCOUNTER — Telehealth: Payer: Self-pay | Admitting: Pharmacist

## 2022-10-10 VITALS — BP 127/81 | HR 78

## 2022-10-10 DIAGNOSIS — I10 Essential (primary) hypertension: Secondary | ICD-10-CM

## 2022-10-10 NOTE — Progress Notes (Signed)
S:     No chief complaint on file.  52 y.o. male who presents for hypertension evaluation, education, and management.  PMH is significant for HTN, [id number] complicated by DKA hx, hypoglycemia, hx of diabetic infection, PAF, venous insufficiency (chronic), GERD, obesity.  Patient was referred and last seen by Primary Care Provider, Dr. Margarita Rana, on 09/08/2022.   At his visit with Dr. Margarita Rana, BP was 161/90 mmHg. He endorsed compliance with his antihypertensive but endorsed a lot of stress and anxiety related to his health and social determinants of health. His metoprolol was changed to valsartan/HCTZ. Medication adherence was emphasized at that visit as well.   Today, patient arrives in good spirits and presents without assistance. Denies dizziness, headache, blurred vision, swelling. He is tolerating the new antihypertensive medication well. He takes this in the evenings and took last evening. He denies any missed doses. He did stop the metoprolol as instructed.   Patient reports hypertension is longstanding .   Family/Social history: -Fhx: DM (mother) -Tobacco: never smoker -Alcohol: none reported   Current antihypertensives include: valsartan-HCTZ 160-25 mg daily -Takes furosemide daily for fluid control with his hx of venous insufficiency   Prevous antihypertensives: amlodipine (pedal edema), lisinopril, lisinopril/HCTZ,  Reported home BP readings:  -Uses a wrist cuff but does not have it with him today.  -His readings vary, and he is unsure about the accuracy of this cuff.  Patient reported dietary habits:  -Limits his sodium consumption.  -Denies any excessive intake of caffeine   Patient-reported exercise habits:  -No formal exercise regimen reported   O:  Vitals:   10/10/22 0854  BP: 127/81  Pulse: 78     Last 3 Office BP readings: BP Readings from Last 3 Encounters:  10/10/22 127/81  09/08/22 (!) 161/90  08/21/22 124/79    BMET    Component Value Date/Time    NA 134 (L) 08/21/2022 0620   NA 141 05/09/2022 1359   K 4.0 08/21/2022 0620   CL 101 08/21/2022 0620   CO2 23 08/21/2022 0620   GLUCOSE 400 (H) 08/21/2022 0620   BUN 15 08/21/2022 0620   BUN 30 (H) 05/09/2022 1359   CREATININE 1.01 08/21/2022 0620   CALCIUM 7.8 (L) 08/21/2022 0620   GFRNONAA >60 08/21/2022 0620   GFRAA >60 06/14/2019 1400    Renal function: CrCl cannot be calculated (Patient's most recent lab result is older than the maximum 21 days allowed.).  Clinical ASCVD: No  The 10-year ASCVD risk score (Arnett DK, et al., 2019) is: 13.8%   Values used to calculate the score:     Age: 62 years     Sex: Male     Is Non-Hispanic African American: Yes     Diabetic: Yes     Tobacco smoker: No     Systolic Blood Pressure: [id number]     Is BP treated: Yes     HDL Cholesterol: 71 mg/dL     Total Cholesterol: 173 mg/dL  A/P: Hypertension longstanding currently at goal on current medications. BP goal < 130/80 mmHg. Medication adherence appears appropriate. Commended patient on his improvement!  -Continued current regimen.  - Message sent to  PCP regarding PDE inhibition request.  -Patient educated on purpose, proper use, and potential adverse effects of valsartan/HCTZ.  -F/u labs ordered - CMP14+eGFR -Counseled on lifestyle modifications for blood pressure control including reduced dietary sodium, increased exercise, adequate sleep. -Encouraged patient to check BP at home and bring log of readings to next visit.  Counseled on proper use of home BP cuff.   Results reviewed and written information provided.    Written patient instructions provided. Patient verbalized understanding of treatment plan.  Total time in face to face counseling 15 minutes.    Follow-up:  Pharmacist prn. PCP clinic visit as instructed.   Benard Halsted, PharmD, [place], [place] [phone]

## 2022-10-10 NOTE — Telephone Encounter (Signed)
Patient seen today for BP. At your visit a month ago, metoprolol was changed to valsartan/HCTZ and his BP today was 127/81 with a pulse rate of 78. He is again requesting a PDE inhibitor for ED but I read in your note that his BP must be better controlled for safety reasons. He wonders now that his BP appears better controlled if he can get a rx for Viagra. I told him I would let you know.   Thank you for including me!

## 2022-10-11 LAB — CMP14+EGFR
ALT: 14 IU/L (ref 0–44)
AST: 19 IU/L (ref 0–40)
Albumin/Globulin Ratio: 1.2 (ref 1.2–2.2)
Albumin: 4 g/dL — ABNORMAL LOW (ref 4.1–5.1)
Alkaline Phosphatase: 87 IU/L (ref 44–121)
BUN/Creatinine Ratio: 22 — ABNORMAL HIGH (ref 9–20)
BUN: 26 mg/dL — ABNORMAL HIGH (ref 6–24)
Bilirubin Total: 0.2 mg/dL (ref 0.0–1.2)
CO2: 20 mmol/L (ref 20–29)
Calcium: 9.5 mg/dL (ref 8.7–10.2)
Chloride: 104 mmol/L (ref 96–106)
Creatinine, Ser: 1.17 mg/dL (ref 0.76–1.27)
Globulin, Total: 3.3 g/dL (ref 1.5–4.5)
Glucose: 45 mg/dL — ABNORMAL LOW (ref 70–99)
Potassium: 4.4 mmol/L (ref 3.5–5.2)
Sodium: 139 mmol/L (ref 134–144)
Total Protein: 7.3 g/dL (ref 6.0–8.5)
eGFR: 76 mL/min/{1.73_m2} (ref >59)

## 2022-10-13 ENCOUNTER — Other Ambulatory Visit: Payer: Self-pay

## 2022-10-13 ENCOUNTER — Telehealth: Payer: Self-pay | Admitting: Emergency Medicine

## 2022-10-13 MED ORDER — SILDENAFIL CITRATE 50 MG PO TABS
50.0000 mg | ORAL_TABLET | Freq: Every day | ORAL | 1 refills | Status: DC | PRN
  Filled 2022-10-13: qty 10, 10d supply, fill #0
  Filled 2022-11-06: qty 10, 10d supply, fill #1

## 2022-10-13 NOTE — Telephone Encounter (Signed)
Call returned to patient. Verified I was speaking to the patient using identifiers. I covered with him the process of requesting refills from his pharmacy.

## 2022-10-13 NOTE — Telephone Encounter (Signed)
I have sent the prescription to the pharmacy for him.

## 2022-10-13 NOTE — Telephone Encounter (Signed)
Copied from [place] [id number]. Topic: General - Other >> Oct 13, 2022 10:52 AM Chapman Fitch wrote: Reason for CRM: Pt asked for a call from Lurena Joiner /he has a request for him / please advise

## 2022-10-13 NOTE — Addendum Note (Signed)
Addended by: Charlott Rakes on: 10/13/2022 08:54 AM   Modules accepted: Orders

## 2022-10-17 ENCOUNTER — Other Ambulatory Visit: Payer: Self-pay

## 2022-10-20 ENCOUNTER — Ambulatory Visit: Payer: Self-pay | Admitting: *Deleted
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
## 2022-10-27 ENCOUNTER — Other Ambulatory Visit: Payer: Self-pay | Admitting: Family Medicine

## 2022-10-27 ENCOUNTER — Other Ambulatory Visit: Payer: Self-pay

## 2022-10-27 MED ORDER — OZEMPIC (0.25 OR 0.5 MG/DOSE) 2 MG/3ML ~~LOC~~ SOPN
0.5000 mg | PEN_INJECTOR | SUBCUTANEOUS | 0 refills | Status: DC
  Filled 2022-10-27: qty 3, 28d supply, fill #0

## 2022-10-28 NOTE — Telephone Encounter (Signed)
Patient seen by Wallene Huh, PharmD Candidate on 10/27/2022 while they were picking up prescriptions at [facility].   Blood pressure today was : 149/85, HR 95. Patient refused re-check. He had not taken his BP medication today. Reported he would take it as soon as he got home.   Patient does not have an automated home blood pressure machine.   Medication review was performed. They are taking medications as prescribed.   The following barriers to adherence were noted:  Did not have time to address barriers.  - They do occasionally forget to take some of their prescribed medications.  - They do have follow up scheduled with their primary care provider/cardiologist.   The following interventions were completed:  - Patient was educated on goal blood pressures and long term health implications of elevated blood pressure   The patient has follow up scheduled:  PCP: 11/06/2022 with Freeman Caldron, PA   Wallene Huh,  PharmD Candidate   Joseph Art, Pharm.D. PGY-2 Ambulatory Care Pharmacy Resident

## 2022-10-31 ENCOUNTER — Other Ambulatory Visit: Payer: Self-pay

## 2022-10-31 MED ORDER — HYDROCODONE-ACETAMINOPHEN 5-325 MG PO TABS
ORAL_TABLET | ORAL | 0 refills | Status: DC
  Filled 2022-10-31: qty 28, 7d supply, fill #0

## 2022-11-05 ENCOUNTER — Other Ambulatory Visit: Payer: Self-pay

## 2022-11-06 ENCOUNTER — Ambulatory Visit: Payer: Self-pay | Attending: Physician Assistant | Admitting: Physician Assistant

## 2022-11-06 ENCOUNTER — Other Ambulatory Visit: Payer: Self-pay

## 2022-11-06 DIAGNOSIS — E1165 Type 2 diabetes mellitus with hyperglycemia: Secondary | ICD-10-CM

## 2022-11-07 ENCOUNTER — Other Ambulatory Visit: Payer: Self-pay

## 2022-11-10 ENCOUNTER — Other Ambulatory Visit: Payer: Self-pay | Admitting: Family Medicine

## 2022-11-10 DIAGNOSIS — E1142 Type 2 diabetes mellitus with diabetic polyneuropathy: Secondary | ICD-10-CM
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
## 2022-11-11 ENCOUNTER — Other Ambulatory Visit: Payer: Self-pay

## 2022-11-11 MED ORDER — BASAGLAR KWIKPEN 100 UNIT/ML ~~LOC~~ SOPN
30.0000 [IU] | PEN_INJECTOR | Freq: Every day | SUBCUTANEOUS | 2 refills | Status: DC
  Filled 2022-11-11 – 2022-11-17 (×2): qty 9, 30d supply, fill #0
  Filled 2022-12-08: qty 9, 30d supply, fill #1
  Filled 2023-01-19: qty 9, 30d supply, fill #2

## 2022-11-11 NOTE — Telephone Encounter (Signed)
Requested Prescriptions  Pending Prescriptions Disp Refills   Insulin Glargine (BASAGLAR KWIKPEN) 100 UNIT/ML 9 mL 2    Sig: Inject 30 Units into the skin daily.     Endocrinology:  Diabetes - Insulins Failed - 11/10/2022  4:02 PM      Failed - HBA1C is between 0 and 7.9 and within 180 days    HbA1c, POC (controlled diabetic range)  Date Value Ref Range Status  03/03/2022 9.7 (A) 0.0 - 7.0 % Final   Hgb A1c MFr Bld  Date Value Ref Range Status  08/14/2022 11.9 (H) 4.8 - 5.6 % Final    Comment:    (NOTE)         Prediabetes: 5.7 - 6.4         Diabetes: >6.4         Glycemic control for adults with diabetes: <7.0          Passed - Valid encounter within last 6 months    Recent Outpatient Visits           1 month ago Essential hypertension   [place], [place] L, RPH-CPP   2 months ago Type 2 diabetes mellitus with diabetic polyneuropathy, with long-term current use of insulin ([place])   [place], [place], MD   6 months ago Type 2 diabetes mellitus with diabetic polyneuropathy, with long-term current use of insulin [facility])   [place], [place] L, RPH-CPP   7 months ago Type 2 diabetes mellitus with diabetic polyneuropathy, with long-term current use of insulin [facility])   [place], [place] L, RPH-CPP   8 months ago Type 2 diabetes mellitus with diabetic polyneuropathy, with long-term current use of insulin [facility])   [place] [facility] & [facility] Charlott Rakes, MD

## 2022-11-14 ENCOUNTER — Other Ambulatory Visit: Payer: Self-pay

## 2022-11-17 ENCOUNTER — Other Ambulatory Visit: Payer: Self-pay

## 2022-11-17 ENCOUNTER — Telehealth: Payer: Self-pay | Admitting: Family Medicine

## 2022-11-17 ENCOUNTER — Other Ambulatory Visit: Payer: Self-pay | Admitting: Family Medicine

## 2022-11-17 DIAGNOSIS — I890 Lymphedema, not elsewhere classified: Secondary | ICD-10-CM

## 2022-11-17 DIAGNOSIS — E1142 Type 2 diabetes mellitus with diabetic polyneuropathy: Secondary | ICD-10-CM

## 2022-11-17 DIAGNOSIS — E1169 Type 2 diabetes mellitus with other specified complication: Secondary | ICD-10-CM

## 2022-11-17 MED ORDER — INSULIN PEN NEEDLE 32G X 6 MM MISC
0 refills | Status: DC
  Filled 2022-11-17: qty 100, 25d supply, fill #0

## 2022-11-17 MED ORDER — SILDENAFIL CITRATE 50 MG PO TABS
50.0000 mg | ORAL_TABLET | Freq: Every day | ORAL | 0 refills | Status: DC | PRN
  Filled 2022-11-17: qty 10, 10d supply, fill #0
  Filled 2022-11-18: qty 10, 30d supply, fill #0

## 2022-11-17 MED ORDER — OZEMPIC (0.25 OR 0.5 MG/DOSE) 2 MG/3ML ~~LOC~~ SOPN
0.5000 mg | PEN_INJECTOR | SUBCUTANEOUS | 0 refills | Status: DC
  Filled 2022-11-17 – 2022-11-18 (×2): qty 3, 28d supply, fill #0

## 2022-11-17 MED ORDER — ATORVASTATIN CALCIUM 20 MG PO TABS
20.0000 mg | ORAL_TABLET | Freq: Every day | ORAL | 0 refills | Status: DC
  Filled 2022-11-17 – 2023-01-19 (×3): qty 30, 30d supply, fill #0

## 2022-11-17 MED ORDER — TRUE METRIX BLOOD GLUCOSE TEST VI STRP
1.0000 | ORAL_STRIP | 0 refills | Status: DC
  Filled 2022-11-17 – 2022-11-18 (×2): qty 100, 33d supply, fill #0

## 2022-11-17 MED ORDER — FUROSEMIDE 20 MG PO TABS
20.0000 mg | ORAL_TABLET | Freq: Every day | ORAL | 0 refills | Status: DC
  Filled 2022-11-17 – 2023-01-19 (×2): qty 30, 30d supply, fill #0

## 2022-11-17 NOTE — Telephone Encounter (Signed)
Sent to community pharmacy

## 2022-11-17 NOTE — Telephone Encounter (Signed)
Medication Refill - Medication: Insulin Pen Needle 32G X 5 MM   Has the patient contacted their pharmacy? Yes.     Preferred Pharmacy (with phone number or street name):  [facility] Phone: [phone]  Fax: [phone]     Has the patient been seen for an appointment in the last year OR does the patient have an upcoming appointment? Yes.

## 2022-11-19 ENCOUNTER — Other Ambulatory Visit: Payer: Self-pay

## 2022-11-20 ENCOUNTER — Other Ambulatory Visit: Payer: Self-pay

## 2022-11-21 ENCOUNTER — Ambulatory Visit: Payer: Self-pay | Admitting: Vascular Surgery

## 2022-12-04 ENCOUNTER — Ambulatory Visit: Payer: Self-pay | Admitting: Physician Assistant

## 2022-12-05 ENCOUNTER — Other Ambulatory Visit: Payer: Self-pay

## 2022-12-05 ENCOUNTER — Other Ambulatory Visit: Payer: Self-pay | Admitting: Family Medicine

## 2022-12-05 MED ORDER — SILDENAFIL CITRATE 50 MG PO TABS
50.0000 mg | ORAL_TABLET | Freq: Every day | ORAL | 0 refills | Status: DC | PRN
  Filled 2022-12-05 – 2022-12-08 (×2): qty 10, 10d supply, fill #0

## 2022-12-08 ENCOUNTER — Other Ambulatory Visit: Payer: Self-pay | Admitting: Family Medicine

## 2022-12-08 ENCOUNTER — Other Ambulatory Visit: Payer: Self-pay

## 2022-12-09 ENCOUNTER — Other Ambulatory Visit: Payer: Self-pay

## 2022-12-09 MED ORDER — OZEMPIC (0.25 OR 0.5 MG/DOSE) 2 MG/3ML ~~LOC~~ SOPN
0.5000 mg | PEN_INJECTOR | SUBCUTANEOUS | 0 refills | Status: DC
  Filled 2022-12-09 – 2022-12-26 (×2): qty 3, 28d supply, fill #0

## 2022-12-09 NOTE — Telephone Encounter (Signed)
Requested medication (s) are due for refill today: yes  Requested medication (s) are on the active medication list: yes  Last refill:  11/17/22 #22ml 0 refills  Future visit scheduled: no  Notes to clinic:  protocol failed last labs 03/03/22  last dispensed 11/20/22. Do you want to refill Rx?      Requested Prescriptions  Pending Prescriptions Disp Refills   Semaglutide,0.25 or 0.5MG /DOS, (OZEMPIC, 0.25 OR 0.5 MG/DOSE,) 2 MG/3ML SOPN 3 mL 0    Sig: Inject 0.5 mg into the skin once a week.     Endocrinology:  Diabetes - GLP-1 Receptor Agonists - semaglutide Failed - 12/08/2022  1:34 PM      Failed - HBA1C in normal range and within 180 days    HbA1c, POC (controlled diabetic range)  Date Value Ref Range Status  03/03/2022 9.7 (A) 0.0 - 7.0 % Final   Hgb A1c MFr Bld  Date Value Ref Range Status  08/14/2022 11.9 (H) 4.8 - 5.6 % Final    Comment:    (NOTE)         Prediabetes: 5.7 - 6.4         Diabetes: >6.4         Glycemic control for adults with diabetes: <7.0          Passed - Cr in normal range and within 360 days    Creatinine, Ser  Date Value Ref Range Status  10/10/2022 1.17 0.76 - 1.27 mg/dL Final         Passed - Valid encounter within last 6 months    Recent Outpatient Visits           2 months ago Essential hypertension   [place] [facility] [place], [place] L, RPH-CPP   3 months ago Type 2 diabetes mellitus with diabetic polyneuropathy, with long-term current use of insulin (HCC)   [place] [facility] [place], [place], MD   7 months ago Type 2 diabetes mellitus with diabetic polyneuropathy, with long-term current use of insulin [facility])   [place] [facility] [place], [place] L, RPH-CPP   8 months ago Type 2 diabetes mellitus with diabetic polyneuropathy, with long-term current use of insulin [facility])   [place] [facility] [place], [place] L, RPH-CPP    9 months ago Type 2 diabetes mellitus with diabetic polyneuropathy, with long-term current use of insulin [facility])   [place] [facility] & [facility] Hoy Register, MD

## 2022-12-25 ENCOUNTER — Other Ambulatory Visit: Payer: Self-pay

## 2022-12-25 ENCOUNTER — Other Ambulatory Visit: Payer: Self-pay | Admitting: Family Medicine

## 2022-12-25 DIAGNOSIS — K219 Gastro-esophageal reflux disease without esophagitis: Secondary | ICD-10-CM

## 2022-12-25 MED ORDER — TRUE METRIX BLOOD GLUCOSE TEST VI STRP
1.0000 | ORAL_STRIP | 0 refills | Status: DC
  Filled 2022-12-25: qty 100, 33d supply, fill #0

## 2022-12-25 MED ORDER — SILDENAFIL CITRATE 50 MG PO TABS
50.0000 mg | ORAL_TABLET | Freq: Every day | ORAL | 0 refills | Status: DC | PRN
  Filled 2022-12-25: qty 10, 10d supply, fill #0

## 2022-12-25 MED ORDER — OMEPRAZOLE 40 MG PO CPDR
40.0000 mg | DELAYED_RELEASE_CAPSULE | Freq: Every day | ORAL | 0 refills | Status: DC
Start: 2022-12-25 — End: 2023-01-19
  Filled 2022-12-25: qty 90, 90d supply, fill #0

## 2022-12-25 NOTE — Telephone Encounter (Signed)
Requested Prescriptions  Pending Prescriptions Disp Refills   omeprazole (PRILOSEC) 40 MG capsule 90 capsule 0    Sig: Take 1 capsule (40 mg total) by mouth daily.     Gastroenterology: Proton Pump Inhibitors Passed - 12/25/2022  2:28 PM      Passed - Valid encounter within last 12 months    Recent Outpatient Visits           2 months ago Essential hypertension   [place] [facility] [place], [place] L, RPH-CPP   3 months ago Type 2 diabetes mellitus with diabetic polyneuropathy, with long-term current use of insulin (HCC)   [place] [facility] [place], [place], MD   7 months ago Type 2 diabetes mellitus with diabetic polyneuropathy, with long-term current use of insulin [place] [facility])   [place] [facility] [place], [place] L, RPH-CPP   8 months ago Type 2 diabetes mellitus with diabetic polyneuropathy, with long-term current use of insulin [facility])   [place] [facility] [place], [place] L, RPH-CPP   9 months ago Type 2 diabetes mellitus with diabetic polyneuropathy, with long-term current use of insulin (HCC)   [place] [facility] [place], [place], MD               glucose blood (TRUE METRIX BLOOD GLUCOSE TEST) test strip 100 strip 0    Sig: Use as directed     Endocrinology: Diabetes - Testing Supplies Passed - 12/25/2022  2:28 PM      Passed - Valid encounter within last 12 months    Recent Outpatient Visits           2 months ago Essential hypertension   [place] [facility] [place], [place] L, RPH-CPP   3 months ago Type 2 diabetes mellitus with diabetic polyneuropathy, with long-term current use of insulin (HCC)   [place] [place], Odette Horns, MD   7 months ago Type 2 diabetes mellitus with diabetic polyneuropathy, with long-term current use of insulin [facility])   [place]  [facility] & [facility] [place], [place] L, RPH-CPP   8 months ago Type 2 diabetes mellitus with diabetic polyneuropathy, with long-term current use of insulin [facility])   [place] [facility] [place], [place] L, RPH-CPP   9 months ago Type 2 diabetes mellitus with diabetic polyneuropathy, with long-term current use of insulin (HCC)   [place] [facility] [place], [place], MD               sildenafil (VIAGRA) 50 MG tablet 10 tablet 0    Sig: Take 1 tablet (50 mg total) by mouth daily as needed for erectile dysfunction. At least 24 hours between doses     Urology: Erectile Dysfunction Agents Passed - 12/25/2022  2:28 PM      Passed - AST in normal range and within 360 days    AST  Date Value Ref Range Status  10/10/2022 19 0 - 40 IU/L Final         Passed - ALT in normal range and within 360 days    ALT  Date Value Ref Range Status  10/10/2022 14 0 - 44 IU/L Final         Passed - Last BP in normal range    BP Readings from Last 1 Encounters:  10/10/22  127/81         Passed - Valid encounter within last 12 months    Recent Outpatient Visits           2 months ago Essential hypertension   [place] [facility] Center [place], [place] L, RPH-CPP   3 months ago Type 2 diabetes mellitus with diabetic polyneuropathy, with long-term current use of insulin (HCC)   [facility] [place], Odette Horns, MD   7 months ago Type 2 diabetes mellitus with diabetic polyneuropathy, with long-term current use of insulin [facility])   [place] [facility] [place], [place] L, RPH-CPP   8 months ago Type 2 diabetes mellitus with diabetic polyneuropathy, with long-term current use of insulin [facility])   [place] [facility] & [facility] Center [place], Jeannett Senior L, RPH-CPP   9 months ago Type 2 diabetes mellitus with diabetic polyneuropathy, with  long-term current use of insulin [facility])   [place] [facility] & [facility] Hoy Register, MD

## 2022-12-26 ENCOUNTER — Other Ambulatory Visit: Payer: Self-pay

## 2023-01-01 ENCOUNTER — Other Ambulatory Visit: Payer: Self-pay

## 2023-01-09 ENCOUNTER — Other Ambulatory Visit: Payer: Self-pay

## 2023-01-09 ENCOUNTER — Other Ambulatory Visit: Payer: Self-pay | Admitting: Family Medicine

## 2023-01-12 ENCOUNTER — Telehealth: Payer: Self-pay

## 2023-01-12 ENCOUNTER — Other Ambulatory Visit: Payer: Self-pay

## 2023-01-12 NOTE — Telephone Encounter (Signed)
Copied from CRM [id number]. Topic: General - Other >> Jan 12, 2023  9:52 AM Taylor Hughes wrote: Reason for CRM: The patient has called to follow up on their previously submitted refill requests for Rx #: [id number] Semaglutide,0.25 or 0.5MG /DOS, (OZEMPIC, 0.25 OR 0.5 MG/DOSE,) 2 MG/3ML SOPN [[id number]]  And Rx #: [id number] sildenafil (VIAGRA) 50 MG tablet [[id number]]  Please contact the patient further when possible

## 2023-01-13 NOTE — Telephone Encounter (Signed)
Pt called and appointment has been scheduled with provider

## 2023-01-19 ENCOUNTER — Encounter: Payer: Self-pay | Admitting: Family Medicine

## 2023-01-19 ENCOUNTER — Ambulatory Visit: Payer: Self-pay | Attending: Family Medicine | Admitting: Family Medicine

## 2023-01-19 ENCOUNTER — Other Ambulatory Visit: Payer: Self-pay

## 2023-01-19 VITALS — BP 158/94 | HR 75 | Ht 71.0 in | Wt 299.6 lb

## 2023-01-19 DIAGNOSIS — E1159 Type 2 diabetes mellitus with other circulatory complications: Secondary | ICD-10-CM

## 2023-01-19 DIAGNOSIS — N528 Other male erectile dysfunction: Secondary | ICD-10-CM

## 2023-01-19 DIAGNOSIS — I152 Hypertension secondary to endocrine disorders: Secondary | ICD-10-CM

## 2023-01-19 DIAGNOSIS — K219 Gastro-esophageal reflux disease without esophagitis: Secondary | ICD-10-CM

## 2023-01-19 DIAGNOSIS — Z794 Long term (current) use of insulin: Secondary | ICD-10-CM

## 2023-01-19 DIAGNOSIS — E1142 Type 2 diabetes mellitus with diabetic polyneuropathy: Secondary | ICD-10-CM

## 2023-01-19 DIAGNOSIS — F331 Major depressive disorder, recurrent, moderate: Secondary | ICD-10-CM

## 2023-01-19 DIAGNOSIS — I890 Lymphedema, not elsewhere classified: Secondary | ICD-10-CM

## 2023-01-19 LAB — POCT GLYCOSYLATED HEMOGLOBIN (HGB A1C): HbA1c, POC (controlled diabetic range): 9.7 % — AB (ref 0.0–7.0)

## 2023-01-19 MED ORDER — ATORVASTATIN CALCIUM 20 MG PO TABS
20.0000 mg | ORAL_TABLET | Freq: Every day | ORAL | 1 refills | Status: DC
Start: 2023-01-19 — End: 2023-11-24
  Filled 2023-05-14: qty 90, 90d supply, fill #0
  Filled 2023-08-17: qty 90, 90d supply, fill #1

## 2023-01-19 MED ORDER — GABAPENTIN 300 MG PO CAPS
300.0000 mg | ORAL_CAPSULE | Freq: Two times a day (BID) | ORAL | 1 refills | Status: DC
Start: 2023-01-19 — End: 2023-10-05
  Filled 2023-01-19 – 2023-04-09 (×2): qty 180, 90d supply, fill #0
  Filled 2023-07-13: qty 180, 90d supply, fill #1

## 2023-01-19 MED ORDER — INSULIN LISPRO (1 UNIT DIAL) 100 UNIT/ML (KWIKPEN)
0.0000 [IU] | PEN_INJECTOR | Freq: Three times a day (TID) | SUBCUTANEOUS | 3 refills | Status: DC
Start: 2023-01-19 — End: 2023-07-22
  Filled 2023-01-19: qty 12, fill #0
  Filled 2023-01-30 (×2): qty 12, 34d supply, fill #0
  Filled 2023-03-12 – 2023-04-09 (×3): qty 12, 34d supply, fill #1
  Filled 2023-05-14: qty 12, 34d supply, fill #2
  Filled 2023-05-14: qty 15, 42d supply, fill #2
  Filled 2023-06-24: qty 9, 25d supply, fill #3

## 2023-01-19 MED ORDER — OZEMPIC (0.25 OR 0.5 MG/DOSE) 2 MG/3ML ~~LOC~~ SOPN
0.5000 mg | PEN_INJECTOR | SUBCUTANEOUS | 0 refills | Status: DC
Start: 2023-01-19 — End: 2023-03-25
  Filled 2023-01-19 – 2023-01-30 (×4): qty 3, 28d supply, fill #0

## 2023-01-19 MED ORDER — FUROSEMIDE 20 MG PO TABS
20.0000 mg | ORAL_TABLET | Freq: Every day | ORAL | 1 refills | Status: DC
Start: 2023-01-19 — End: 2023-04-07
  Filled 2023-02-19: qty 90, 90d supply, fill #0

## 2023-01-19 MED ORDER — OMEPRAZOLE 40 MG PO CPDR
40.0000 mg | DELAYED_RELEASE_CAPSULE | Freq: Every day | ORAL | 1 refills | Status: DC
Start: 2023-01-19 — End: 2023-12-03
  Filled 2023-01-19 – 2023-06-02 (×2): qty 90, 90d supply, fill #0
  Filled 2023-08-17: qty 90, 90d supply, fill #1

## 2023-01-19 MED ORDER — BASAGLAR KWIKPEN 100 UNIT/ML ~~LOC~~ SOPN
30.0000 [IU] | PEN_INJECTOR | Freq: Every day | SUBCUTANEOUS | 6 refills | Status: DC
Start: 2023-01-19 — End: 2023-04-07
  Filled 2023-02-20: qty 15, 50d supply, fill #0

## 2023-01-19 MED ORDER — BUPROPION HCL ER (XL) 150 MG PO TB24
150.0000 mg | ORAL_TABLET | Freq: Every day | ORAL | 1 refills | Status: DC
Start: 2023-01-19 — End: 2023-03-25

## 2023-01-19 MED ORDER — SILDENAFIL CITRATE 50 MG PO TABS
50.0000 mg | ORAL_TABLET | Freq: Every day | ORAL | 3 refills | Status: DC | PRN
Start: 2023-01-19 — End: 2023-03-12
  Filled 2023-01-19: qty 10, 10d supply, fill #0
  Filled 2023-01-30: qty 10, 10d supply, fill #1
  Filled 2023-02-09: qty 10, 10d supply, fill #2
  Filled 2023-02-19: qty 10, 10d supply, fill #3

## 2023-01-19 MED ORDER — VALSARTAN-HYDROCHLOROTHIAZIDE 160-25 MG PO TABS
1.0000 | ORAL_TABLET | Freq: Every day | ORAL | 1 refills | Status: DC
Start: 2023-01-19 — End: 2023-04-27
  Filled 2023-02-19: qty 90, 90d supply, fill #0
  Filled 2023-02-20: qty 30, 30d supply, fill #0

## 2023-01-19 MED ORDER — SEMAGLUTIDE (1 MG/DOSE) 4 MG/3ML ~~LOC~~ SOPN
1.0000 mg | PEN_INJECTOR | SUBCUTANEOUS | 3 refills | Status: DC
Start: 2023-01-19 — End: 2023-04-07
  Filled 2023-01-19: qty 3, 28d supply, fill #0

## 2023-01-19 NOTE — Patient Instructions (Signed)
Edema  Edema is when you have too much fluid in your body or under your skin. Edema may make your legs, feet, and ankles swell. Swelling often happens in looser tissues, such as around your eyes. This is a common condition. It gets more common as you get older. There are many possible causes of edema. These include: Eating too much salt (sodium). Being on your feet or sitting for a long time. Certain medical conditions, such as: Pregnancy. Heart failure. Liver disease. Kidney disease. Cancer. Hot weather may make edema worse. Edema is usually painless. Your skin may look swollen or shiny. Follow these instructions at home: Medicines Take over-the-counter and prescription medicines only as told by your doctor. Your doctor may prescribe a medicine to help your body get rid of extra water (diuretic). Take this medicine if you are told to take it. Eating and drinking Eat a low-salt (low-sodium) diet as told by your doctor. Sometimes, eating less salt may reduce swelling. Depending on the cause of your swelling, you may need to limit how much fluid you drink (fluid restriction). General instructions Raise the injured area above the level of your heart while you are sitting or lying down. Do not sit still or stand for a long time. Do not wear tight clothes. Do not wear garters on your upper legs. Exercise your legs. This can help the swelling go down. Wear compression stockings as told by your doctor. It is important that these are the right size. These should be prescribed by your doctor to prevent possible injuries. If elastic bandages or wraps are recommended, use them as told by your doctor. Contact a doctor if: Treatment is not working. You have heart, liver, or kidney disease and have symptoms of edema. You have sudden and unexplained weight gain. Get help right away if: You have shortness of breath or chest pain. You cannot breathe when you lie down. You have pain, redness, or  warmth in the swollen areas. You have heart, liver, or kidney disease and get edema all of a sudden. You have a fever and your symptoms get worse all of a sudden. These symptoms may be an emergency. Get help right away. Call 911. Do not wait to see if the symptoms will go away. Do not drive yourself to the hospital. Summary Edema is when you have too much fluid in your body or under your skin. Edema may make your legs, feet, and ankles swell. Swelling often happens in looser tissues, such as around your eyes. Raise the injured area above the level of your heart while you are sitting or lying down. Follow your doctor's instructions about diet and how much fluid you can drink. This information is not intended to replace advice given to you by your health care provider. Make sure you discuss any questions you have with your health care provider. Document Revised: 04/01/2021 Document Reviewed: 04/01/2021 Elsevier Patient Education  2024 [employer]  

## 2023-01-19 NOTE — Progress Notes (Signed)
Subjective:  Patient ID: Taylor Hughes, male    DOB: 11/01/1970  Age: 52 y.o. MRN: [id number]  CC: Diabetes   HPI Taylor Hughes is a 52 y.o. year old male with a history of type 2 diabetes mellitus (A1c of 11.9), L fifth toe ray amputation, hypertension, hyperlipidemia   Interval History:  A1c is 9.7 down from 11.9 previously. He has been without Ozempic for a few weeks but states he has been adherent with his Lantus and NovoLog.  He is working on adhering to a diabetic diet.  Gastroparesis has been controlled and he was able to tolerate Ozempic without any problems.  He would like to remain on Ozempic due to weight loss benefit from the fact that it helps his sugar. He endorses adherence with his antihypertensive and statin.  Past Medical History:  Diagnosis Date   Atrial fibrillation (HCC)    Diabetes mellitus without complication (HCC)    Type 2   GERD (gastroesophageal reflux disease)    Wears glasses    Wound, open, foot    left diabetic     Past Surgical History:  Procedure Laterality Date   AMPUTATION Left 08/20/2018   Procedure: LEFT FOOT IRRIGATON AND DEBRIDEMENT, 5TH RAY AMPUTATION;  Surgeon: Nadara Mustard, MD;  Location: [facility] OR;  Service: Orthopedics;  Laterality: Left;   APPLICATION OF A-CELL OF CHEST/ABDOMEN N/A 05/30/2019   Procedure: Application Of A-Cell Of Chest;  Surgeon: Corliss Skains, MD;  Location: [facility] OR;  Service: Cardiothoracic;  Laterality: N/A;   APPLICATION OF WOUND VAC N/A 05/30/2019   Procedure: Application Of Wound Vac;  Surgeon: Corliss Skains, MD;  Location: [facility] OR;  Service: Cardiothoracic;  Laterality: N/A;   FINGER SURGERY Right 2016   I&D  small finger   I & D EXTREMITY Left 07/30/2018   Procedure: IRRIGATION AND DEBRIDEMENT LEFT FOOT WITH POSSIBLE AMPUTATION OF FIFTH TOE;  Surgeon: Kathryne Hitch, MD;  Location: [facility] ORS;  Service: Orthopedics;  Laterality: Left;   INCISION AND DRAINAGE ABSCESS Left 01/19/2019   Procedure:  INCISION AND DRAINAGE ABSCESS upper chest;  Surgeon: Christia Reading, MD;  Location: [facility] OR;  Service: ENT;  Laterality: Left;   IRRIGATION AND DEBRIDEMENT STERNOCLAVICULAR JOINT-STERNUM AND RIBS N/A 05/30/2019   Procedure: IRRIGATION AND DEBRIDEMENT OF STERNOCLAVICULAR JOINT-STERNUM AND RIBS ;  Surgeon: Corliss Skains, MD;  Location: [facility] OR;  Service: Cardiothoracic;  Laterality: N/A;   MINOR IRRIGATION AND DEBRIDEMENT OF WOUND N/A 11/22/2018   Procedure: INCISION AND DRAINAGE OF NECK ABSCESS;  Surgeon: Christia Reading, MD;  Location: [facility] ORS;  Service: ENT;  Laterality: N/A;    Family History  Problem Relation Age of Onset   Diabetes Mother     Social History   Socioeconomic History   Marital status: Divorced    Spouse name: Not on file   Number of children: Not on file   Years of education: Not on file   Highest education level: Not on file  Occupational History   Not on file  Tobacco Use   Smoking status: Never   Smokeless tobacco: Never  Vaping Use   Vaping Use: Never used  Substance and Sexual Activity   Alcohol use: No    Alcohol/week: 0.0 standard drinks of alcohol   Drug use: No   Sexual activity: Not on file  Other Topics Concern   Not on file  Social History Narrative   Not on file   Social Determinants of Health   Financial Resource Strain:  Not on file  Food Insecurity: No Food Insecurity (08/14/2022)   Hunger Vital Sign    Worried About Running Out of Food in the Last Year: Never true    Ran Out of Food in the Last Year: Never true  Transportation Needs: No Transportation Needs (08/14/2022)   PRAPARE - Administrator, Civil Service (Medical): No    Lack of Transportation (Non-Medical): No  Physical Activity: Not on file  Stress: Not on file  Social Connections: Not on file    No Known Allergies  Outpatient Medications Prior to Visit  Medication Sig Dispense Refill   acetaminophen (TYLENOL) 325 MG tablet Take 650 mg by mouth every 6 (six)  hours as needed for mild pain, fever or headache.     albuterol (VENTOLIN HFA) 108 (90 Base) MCG/ACT inhaler Inhale 2 puffs into the lungs every 6 (six) hours as needed for wheezing or shortness of breath. 6.7 g 0   aspirin EC 81 MG tablet Take 81 mg by mouth daily. Swallow whole.     Blood Glucose Monitoring Suppl (TRUE METRIX METER) w/Device KIT Use to check blood sugar three times daily. 1 kit 0   feeding supplement, GLUCERNA SHAKE, (GLUCERNA SHAKE) LIQD Take 237 mLs by mouth 3 (three) times daily between meals. 10000 mL 0   glucose blood (TRUE METRIX BLOOD GLUCOSE TEST) test strip Use as directed 100 strip 0   HYDROcodone-acetaminophen (NORCO/VICODIN) 5-325 MG tablet Take 1 tablet every 6 hours by oral route as needed for 7 days. 28 tablet 0   Insulin Pen Needle 32G X 6 MM MISC USE AS DIRECTED 100 each 0   TRUEplus Lancets 28G MISC Use to check blood sugar daily. 100 each 11   atorvastatin (LIPITOR) 20 MG tablet Take 1 tablet (20 mg total) by mouth daily. 30 tablet 0   buPROPion (WELLBUTRIN XL) 150 MG 24 hr tablet Take 1 tablet (150 mg total) by mouth daily. 90 tablet 1   furosemide (LASIX) 20 MG tablet Take 1 tablet (20 mg total) by mouth daily. 30 tablet 0   gabapentin (NEURONTIN) 300 MG capsule Take 1 capsule (300 mg total) by mouth 2 (two) times daily. 60 capsule 6   Insulin Glargine (BASAGLAR KWIKPEN) 100 UNIT/ML Inject 30 Units into the skin daily. 9 mL 2   insulin lispro (HUMALOG KWIKPEN) 100 UNIT/ML KwikPen Inject 0-12 units into the skin three times daily with meals. Per sliding scale. 12 mL 3   omeprazole (PRILOSEC) 40 MG capsule Take 1 capsule (40 mg total) by mouth daily. 90 capsule 0   Semaglutide,0.25 or 0.5MG /DOS, (OZEMPIC, 0.25 OR 0.5 MG/DOSE,) 2 MG/3ML SOPN Inject 0.5 mg into the skin once a week. Please scheduled office visit with your PCP. 3 mL 0   sildenafil (VIAGRA) 50 MG tablet Take 1 tablet (50 mg total) by mouth daily as needed for erectile dysfunction. At least 24 hours  between doses 10 tablet 0   valsartan-hydrochlorothiazide (DIOVAN-HCT) 160-25 MG tablet Take 1 tablet by mouth daily. 90 tablet 1   metoCLOPramide (REGLAN) 10 MG tablet Take 1 tablet (10 mg total) by mouth 3 (three) times daily before meals for 7 days. 21 tablet 0   No facility-administered medications prior to visit.     ROS Review of Systems  Constitutional:  Negative for activity change and appetite change.  HENT:  Negative for sinus pressure and sore throat.   Respiratory:  Negative for chest tightness, shortness of breath and wheezing.   Cardiovascular:  Positive for leg swelling. Negative for chest pain and palpitations.  Gastrointestinal:  Negative for abdominal distention, abdominal pain and constipation.  Genitourinary: Negative.   Musculoskeletal: Negative.   Psychiatric/Behavioral:  Negative for behavioral problems and dysphoric mood.     Objective:  BP (!) 158/94   Pulse 75   Ht 5\' 11"  (1.803 m)   Wt 299 lb 9.6 oz (135.9 kg)   SpO2 98%   BMI 41.79 kg/m      01/19/2023   11:50 AM 01/19/2023   11:13 AM 10/10/2022    8:54 AM  BP/Weight  Systolic BP 158 152 127  Diastolic BP 94 91 81  Wt. (Lbs)  299.6   BMI  41.79 kg/m2       Physical Exam Constitutional:      Appearance: He is well-developed.  Cardiovascular:     Rate and Rhythm: Normal rate.     Heart sounds: Normal heart sounds. No murmur heard. Pulmonary:     Effort: Pulmonary effort is normal.     Breath sounds: Normal breath sounds. No wheezing or rales.  Chest:     Chest wall: No tenderness.  Abdominal:     General: Bowel sounds are normal. There is no distension.     Palpations: Abdomen is soft. There is no mass.     Tenderness: There is no abdominal tenderness.  Musculoskeletal:        General: Normal range of motion.     Right lower leg: Edema present.     Left lower leg: Edema present.  Neurological:     Mental Status: He is alert and oriented to person, place, and time.  Psychiatric:         Mood and Affect: Mood normal.        Latest Ref Rng & Units 10/10/2022    9:17 AM 08/21/2022    6:20 AM 08/20/2022    6:27 AM  CMP  Glucose 70 - 99 mg/dL 45  259  563   BUN 6 - 24 mg/dL 26  15  13    Creatinine 0.76 - 1.27 mg/dL 8.75  6.43  3.29   Sodium 134 - 144 mmol/L 139  134  135   Potassium 3.5 - 5.2 mmol/L 4.4  4.0  3.5   Chloride 96 - 106 mmol/L 104  101  102   CO2 20 - 29 mmol/L 20  23  26    Calcium 8.7 - 10.2 mg/dL 9.5  7.8  7.6   Total Protein 6.0 - 8.5 g/dL 7.3     Total Bilirubin 0.0 - 1.2 mg/dL 0.2     Alkaline Phos 44 - 121 IU/L 87     AST 0 - 40 IU/L 19     ALT 0 - 44 IU/L 14       Lipid Panel     Component Value Date/Time   CHOL 173 05/09/2022 1359   TRIG 50 05/09/2022 1359   HDL 71 05/09/2022 1359   LDLCALC 92 05/09/2022 1359    CBC    Component Value Date/Time   WBC 4.3 08/21/2022 0620   RBC 3.87 (L) 08/21/2022 0620   HGB 10.8 (L) 08/21/2022 0620   HCT 34.0 (L) 08/21/2022 0620   PLT 286 08/21/2022 0620   MCV 87.9 08/21/2022 0620   MCH 27.9 08/21/2022 0620   MCHC 31.8 08/21/2022 0620   RDW 14.7 08/21/2022 0620   LYMPHSABS 1.5 08/21/2022 0620   MONOABS 0.9 08/21/2022 0620   EOSABS 0.0 08/21/2022  7829   BASOSABS 0.0 08/21/2022 5621    Lab Results  Component Value Date   HGBA1C 9.7 (A) 01/19/2023    Assessment & Plan:  1. Type 2 diabetes mellitus with diabetic polyneuropathy, with long-term current use of insulin (HCC) Uncontrolled with A1c of 9.3 Goal is less than 7.0 He has been out of Ozempic which I have refilled along with instructions to uptitrate after 4 weeks Counseled on Diabetic diet, my plate method, 308 minutes of moderate intensity exercise/week Blood sugar logs with fasting goals of 80-120 mg/dl, random of less than 657 and in the event of sugars less than 60 mg/dl or greater than 846 mg/dl encouraged to notify the clinic. Advised on the need for annual eye exams, annual foot exams, Pneumonia vaccine. - POCT glycosylated  hemoglobin (Hb A1C) - Semaglutide,0.25 or 0.5MG /DOS, (OZEMPIC, 0.25 OR 0.5 MG/DOSE,) 2 MG/3ML SOPN; Inject 0.5 mg into the skin once a week. For 4 weeks then increase to 1mg   Dispense: 3 mL; Refill: 0 - Semaglutide, 1 MG/DOSE, 4 MG/3ML SOPN; Inject 1 mg as directed once a week.  Dispense: 3 mL; Refill: 3 - atorvastatin (LIPITOR) 20 MG tablet; Take 1 tablet (20 mg total) by mouth daily.  Dispense: 90 tablet; Refill: 1 - gabapentin (NEURONTIN) 300 MG capsule; Take 1 capsule (300 mg total) by mouth 2 (two) times daily.  Dispense: 180 capsule; Refill: 1 - Insulin Glargine (BASAGLAR KWIKPEN) 100 UNIT/ML; Inject 30 Units into the skin daily.  Dispense: 9 mL; Refill: 6 - insulin lispro (HUMALOG KWIKPEN) 100 UNIT/ML KwikPen; Inject 0-12 units into the skin three times daily with meals. Per sliding scale.  Dispense: 12 mL; Refill: 3  2. Moderate episode of recurrent major depressive disorder (HCC) Controlled - buPROPion (WELLBUTRIN XL) 150 MG 24 hr tablet; Take 1 tablet (150 mg total) by mouth daily.  Dispense: 90 tablet; Refill: 1  3. Lymphedema Uncontrolled Prolonged standing at his job worsens his symptoms - furosemide (LASIX) 20 MG tablet; Take 1 tablet (20 mg total) by mouth daily.  Dispense: 90 tablet; Refill: 1  4. Gastroesophageal reflux disease without esophagitis Controlled - omeprazole (PRILOSEC) 40 MG capsule; Take 1 capsule (40 mg total) by mouth daily.  Dispense: 90 capsule; Refill: 1  5. Hypertension associated with diabetes (HCC) Uncontrolled He is in a hurry as he has to rush home because he is expecting a delivery I will make no regimen changes to his medication and we will reassess at next visit - valsartan-hydrochlorothiazide (DIOVAN-HCT) 160-25 MG tablet; Take 1 tablet by mouth daily.  Dispense: 90 tablet; Refill: 1  6. Other male erectile dysfunction Stable - sildenafil (VIAGRA) 50 MG tablet; Take 1 tablet (50 mg total) by mouth daily as needed for erectile dysfunction. At  least 24 hours between doses  Dispense: 10 tablet; Refill: 3   Meds ordered this encounter  Medications   sildenafil (VIAGRA) 50 MG tablet    Sig: Take 1 tablet (50 mg total) by mouth daily as needed for erectile dysfunction. At least 24 hours between doses    Dispense:  10 tablet    Refill:  3   Semaglutide,0.25 or 0.5MG /DOS, (OZEMPIC, 0.25 OR 0.5 MG/DOSE,) 2 MG/3ML SOPN    Sig: Inject 0.5 mg into the skin once a week. For 4 weeks then increase to 1mg     Dispense:  3 mL    Refill:  0   Semaglutide, 1 MG/DOSE, 4 MG/3ML SOPN    Sig: Inject 1 mg as directed once a  week.    Dispense:  3 mL    Refill:  3   atorvastatin (LIPITOR) 20 MG tablet    Sig: Take 1 tablet (20 mg total) by mouth daily.    Dispense:  90 tablet    Refill:  1   buPROPion (WELLBUTRIN XL) 150 MG 24 hr tablet    Sig: Take 1 tablet (150 mg total) by mouth daily.    Dispense:  90 tablet    Refill:  1   furosemide (LASIX) 20 MG tablet    Sig: Take 1 tablet (20 mg total) by mouth daily.    Dispense:  90 tablet    Refill:  1   gabapentin (NEURONTIN) 300 MG capsule    Sig: Take 1 capsule (300 mg total) by mouth 2 (two) times daily.    Dispense:  180 capsule    Refill:  1   Insulin Glargine (BASAGLAR KWIKPEN) 100 UNIT/ML    Sig: Inject 30 Units into the skin daily.    Dispense:  9 mL    Refill:  6   insulin lispro (HUMALOG KWIKPEN) 100 UNIT/ML KwikPen    Sig: Inject 0-12 units into the skin three times daily with meals. Per sliding scale.    Dispense:  12 mL    Refill:  3   omeprazole (PRILOSEC) 40 MG capsule    Sig: Take 1 capsule (40 mg total) by mouth daily.    Dispense:  90 capsule    Refill:  1   valsartan-hydrochlorothiazide (DIOVAN-HCT) 160-25 MG tablet    Sig: Take 1 tablet by mouth daily.    Dispense:  90 tablet    Refill:  1    Follow-up: Return in about 3 months (around 04/21/2023).       Hoy Register, MD, FAAFP. [facility] [place], [place] [phone]    01/19/2023, 1:09 PM

## 2023-01-21 ENCOUNTER — Other Ambulatory Visit: Payer: Self-pay

## 2023-01-23 ENCOUNTER — Other Ambulatory Visit: Payer: Self-pay

## 2023-01-30 ENCOUNTER — Other Ambulatory Visit: Payer: Self-pay

## 2023-02-02 ENCOUNTER — Other Ambulatory Visit: Payer: Self-pay

## 2023-02-04 ENCOUNTER — Other Ambulatory Visit: Payer: Self-pay

## 2023-02-04 ENCOUNTER — Other Ambulatory Visit: Payer: Self-pay | Admitting: Family Medicine

## 2023-02-04 MED ORDER — TRUE METRIX BLOOD GLUCOSE TEST VI STRP
1.0000 | ORAL_STRIP | 2 refills | Status: DC
  Filled 2023-02-04: qty 100, 33d supply, fill #0
  Filled 2023-03-02: qty 100, 33d supply, fill #1
  Filled 2023-04-21: qty 100, 33d supply, fill #2

## 2023-02-05 ENCOUNTER — Other Ambulatory Visit: Payer: Self-pay

## 2023-02-09 ENCOUNTER — Other Ambulatory Visit: Payer: Self-pay

## 2023-02-19 ENCOUNTER — Other Ambulatory Visit: Payer: Self-pay

## 2023-02-20 ENCOUNTER — Other Ambulatory Visit: Payer: Self-pay

## 2023-02-23 ENCOUNTER — Other Ambulatory Visit: Payer: Self-pay

## 2023-03-01 ENCOUNTER — Emergency Department (HOSPITAL_COMMUNITY)
Admission: EM | Admit: 2023-03-01 | Discharge: 2023-03-01 | Discharge status: Against Medical Advice | Payer: Self-pay | Attending: Emergency Medicine | Admitting: Emergency Medicine

## 2023-03-01 ENCOUNTER — Other Ambulatory Visit: Payer: Self-pay

## 2023-03-01 DIAGNOSIS — R55 Syncope and collapse: Secondary | ICD-10-CM | POA: Insufficient documentation

## 2023-03-01 DIAGNOSIS — Z5321 Procedure and treatment not carried out due to patient leaving prior to being seen by health care provider: Secondary | ICD-10-CM | POA: Insufficient documentation

## 2023-03-01 DIAGNOSIS — R739 Hyperglycemia, unspecified: Secondary | ICD-10-CM | POA: Insufficient documentation

## 2023-03-01 NOTE — ED Triage Notes (Signed)
EMS reports coming from work, second call for near syncopal episode. Pt refused transport first time.   BP 110/40 HR 90 RR 18 Sp02 99 RA CBG 174  20ga LAC NS enroute

## 2023-03-01 NOTE — Discharge Instructions (Signed)
We spoke with you in the emergency department after you arrived by EMS and we recommended a medical evaluation however you decided to leave prior to being evaluated.  If you have any new or worsening symptoms or you continue to feel weak, I would strongly advise you to return to the emergency department for reassessment.  You can return to the emergency department at any time to be reassessed and you can return to be reassessed for any reason.  If you do not want to come back to the emergency department I would advise following up with your primary doctor.

## 2023-03-01 NOTE — ED Provider Notes (Addendum)
Taylor Hughes arrived by EMS from his workplace for reported near syncopal episode.  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  He received fluids from EMS and on arrival he reports he is at baseline and does not want any emergency department evaluation.  His glucose was slightly elevated with paramedics.  [name] demonstrates ability to make medical decisions for himself and understands that leaving without medical evaluation could result in permanent disability, permanent injury, failure to diagnose underlying process, need to return to the emergency department, or even death and he understands that his symptoms could have been due to dehydration, diabetic crisis, cardiac cause, neurologic process, or any other dangerous medical condition which could be fatal.  He refuses physical exam and further evaluation and will be leaving AGAINST MEDICAL ADVICE.  Patient did not have any formal emergency department evaluation.   Taylor Grandchild, MD 03/01/23 Silva Bandy    Taylor Grandchild, MD 03/01/23 [phone]

## 2023-03-02 ENCOUNTER — Other Ambulatory Visit: Payer: Self-pay | Admitting: Pharmacist

## 2023-03-02 ENCOUNTER — Other Ambulatory Visit: Payer: Self-pay

## 2023-03-02 ENCOUNTER — Other Ambulatory Visit: Payer: Self-pay | Admitting: Family Medicine

## 2023-03-02 ENCOUNTER — Other Ambulatory Visit (HOSPITAL_COMMUNITY): Payer: Self-pay

## 2023-03-02 MED ORDER — TRUEPLUS LANCETS 28G MISC
11 refills | Status: DC
  Filled 2023-03-02: qty 100, 33d supply, fill #0

## 2023-03-02 MED ORDER — TRUE METRIX METER W/DEVICE KIT
PACK | 0 refills | Status: DC
  Filled 2023-03-02: qty 1, 30d supply, fill #0

## 2023-03-06 ENCOUNTER — Other Ambulatory Visit: Payer: Self-pay

## 2023-03-09 ENCOUNTER — Other Ambulatory Visit: Payer: Self-pay

## 2023-03-12 ENCOUNTER — Other Ambulatory Visit: Payer: Self-pay

## 2023-03-12 ENCOUNTER — Other Ambulatory Visit: Payer: Self-pay | Admitting: Family Medicine

## 2023-03-12 DIAGNOSIS — N528 Other male erectile dysfunction: Secondary | ICD-10-CM

## 2023-03-12 MED ORDER — SILDENAFIL CITRATE 50 MG PO TABS
50.0000 mg | ORAL_TABLET | Freq: Every day | ORAL | 3 refills | Status: DC | PRN
  Filled 2023-03-12: qty 10, 30d supply, fill #0
  Filled 2023-04-09: qty 10, 10d supply, fill #0
  Filled 2023-04-22: qty 10, 10d supply, fill #1
  Filled 2023-04-28: qty 10, 10d supply, fill #2
  Filled 2023-05-14: qty 10, 10d supply, fill #3

## 2023-03-13 ENCOUNTER — Other Ambulatory Visit: Payer: Self-pay

## 2023-03-19 ENCOUNTER — Other Ambulatory Visit: Payer: Self-pay

## 2023-03-20 ENCOUNTER — Other Ambulatory Visit: Payer: Self-pay

## 2023-03-22 ENCOUNTER — Emergency Department (HOSPITAL_COMMUNITY): Payer: PRIVATE HEALTH INSURANCE

## 2023-03-22 ENCOUNTER — Inpatient Hospital Stay (HOSPITAL_COMMUNITY)
Admission: EM | Admit: 2023-03-22 | Discharge: 2023-04-07 | DRG: 853 | Disposition: A | Discharge status: Home / Self Care | Payer: PRIVATE HEALTH INSURANCE | Attending: Internal Medicine | Admitting: Internal Medicine

## 2023-03-22 ENCOUNTER — Other Ambulatory Visit: Payer: Self-pay

## 2023-03-22 DIAGNOSIS — D529 Folate deficiency anemia, unspecified: Secondary | ICD-10-CM | POA: Diagnosis present

## 2023-03-22 DIAGNOSIS — R000 Tachycardia, unspecified: Secondary | ICD-10-CM

## 2023-03-22 DIAGNOSIS — F32A Depression, unspecified: Secondary | ICD-10-CM | POA: Diagnosis present

## 2023-03-22 DIAGNOSIS — Z6841 Body Mass Index (BMI) 40.0 and over, adult: Secondary | ICD-10-CM

## 2023-03-22 DIAGNOSIS — I482 Chronic atrial fibrillation, unspecified: Secondary | ICD-10-CM | POA: Insufficient documentation

## 2023-03-22 DIAGNOSIS — E1165 Type 2 diabetes mellitus with hyperglycemia: Secondary | ICD-10-CM | POA: Diagnosis present

## 2023-03-22 DIAGNOSIS — Z79899 Other long term (current) drug therapy: Secondary | ICD-10-CM

## 2023-03-22 DIAGNOSIS — I517 Cardiomegaly: Secondary | ICD-10-CM

## 2023-03-22 DIAGNOSIS — L089 Local infection of the skin and subcutaneous tissue, unspecified: Secondary | ICD-10-CM | POA: Diagnosis present

## 2023-03-22 DIAGNOSIS — I959 Hypotension, unspecified: Secondary | ICD-10-CM | POA: Diagnosis not present

## 2023-03-22 DIAGNOSIS — B9689 Other specified bacterial agents as the cause of diseases classified elsewhere: Secondary | ICD-10-CM | POA: Diagnosis present

## 2023-03-22 DIAGNOSIS — E1143 Type 2 diabetes mellitus with diabetic autonomic (poly)neuropathy: Secondary | ICD-10-CM | POA: Diagnosis present

## 2023-03-22 DIAGNOSIS — M86272 Subacute osteomyelitis, left ankle and foot: Secondary | ICD-10-CM

## 2023-03-22 DIAGNOSIS — M86171 Other acute osteomyelitis, right ankle and foot: Secondary | ICD-10-CM | POA: Diagnosis present

## 2023-03-22 DIAGNOSIS — N179 Acute kidney failure, unspecified: Secondary | ICD-10-CM

## 2023-03-22 DIAGNOSIS — E785 Hyperlipidemia, unspecified: Secondary | ICD-10-CM

## 2023-03-22 DIAGNOSIS — R112 Nausea with vomiting, unspecified: Secondary | ICD-10-CM

## 2023-03-22 DIAGNOSIS — E860 Dehydration: Secondary | ICD-10-CM | POA: Diagnosis present

## 2023-03-22 DIAGNOSIS — R739 Hyperglycemia, unspecified: Secondary | ICD-10-CM | POA: Diagnosis not present

## 2023-03-22 DIAGNOSIS — M869 Osteomyelitis, unspecified: Principal | ICD-10-CM

## 2023-03-22 DIAGNOSIS — R5383 Other fatigue: Secondary | ICD-10-CM | POA: Diagnosis not present

## 2023-03-22 DIAGNOSIS — D509 Iron deficiency anemia, unspecified: Secondary | ICD-10-CM | POA: Diagnosis present

## 2023-03-22 DIAGNOSIS — Z91119 Patient's noncompliance with dietary regimen due to unspecified reason: Secondary | ICD-10-CM

## 2023-03-22 DIAGNOSIS — Z794 Long term (current) use of insulin: Secondary | ICD-10-CM

## 2023-03-22 DIAGNOSIS — I471 Supraventricular tachycardia, unspecified: Secondary | ICD-10-CM | POA: Diagnosis present

## 2023-03-22 DIAGNOSIS — Z89439 Acquired absence of unspecified foot: Secondary | ICD-10-CM

## 2023-03-22 DIAGNOSIS — E11649 Type 2 diabetes mellitus with hypoglycemia without coma: Secondary | ICD-10-CM | POA: Diagnosis not present

## 2023-03-22 DIAGNOSIS — A419 Sepsis, unspecified organism: Secondary | ICD-10-CM | POA: Diagnosis not present

## 2023-03-22 DIAGNOSIS — I4892 Unspecified atrial flutter: Secondary | ICD-10-CM | POA: Diagnosis present

## 2023-03-22 DIAGNOSIS — L02611 Cutaneous abscess of right foot: Secondary | ICD-10-CM

## 2023-03-22 DIAGNOSIS — I890 Lymphedema, not elsewhere classified: Secondary | ICD-10-CM

## 2023-03-22 DIAGNOSIS — Z89422 Acquired absence of other left toe(s): Secondary | ICD-10-CM

## 2023-03-22 DIAGNOSIS — Z7982 Long term (current) use of aspirin: Secondary | ICD-10-CM

## 2023-03-22 DIAGNOSIS — E11628 Type 2 diabetes mellitus with other skin complications: Secondary | ICD-10-CM | POA: Diagnosis present

## 2023-03-22 DIAGNOSIS — Z1152 Encounter for screening for COVID-19: Secondary | ICD-10-CM

## 2023-03-22 DIAGNOSIS — M79673 Pain in unspecified foot: Secondary | ICD-10-CM | POA: Diagnosis not present

## 2023-03-22 DIAGNOSIS — F331 Major depressive disorder, recurrent, moderate: Secondary | ICD-10-CM

## 2023-03-22 DIAGNOSIS — K3184 Gastroparesis: Secondary | ICD-10-CM | POA: Diagnosis not present

## 2023-03-22 DIAGNOSIS — B954 Other streptococcus as the cause of diseases classified elsewhere: Secondary | ICD-10-CM | POA: Diagnosis present

## 2023-03-22 DIAGNOSIS — K529 Noninfective gastroenteritis and colitis, unspecified: Secondary | ICD-10-CM | POA: Diagnosis present

## 2023-03-22 DIAGNOSIS — R6520 Severe sepsis without septic shock: Secondary | ICD-10-CM | POA: Diagnosis present

## 2023-03-22 DIAGNOSIS — I10 Essential (primary) hypertension: Secondary | ICD-10-CM | POA: Diagnosis present

## 2023-03-22 DIAGNOSIS — L97519 Non-pressure chronic ulcer of other part of right foot with unspecified severity: Secondary | ICD-10-CM | POA: Diagnosis present

## 2023-03-22 DIAGNOSIS — Z8614 Personal history of Methicillin resistant Staphylococcus aureus infection: Secondary | ICD-10-CM

## 2023-03-22 DIAGNOSIS — E872 Acidosis, unspecified: Secondary | ICD-10-CM | POA: Diagnosis present

## 2023-03-22 DIAGNOSIS — E1152 Type 2 diabetes mellitus with diabetic peripheral angiopathy with gangrene: Secondary | ICD-10-CM | POA: Diagnosis present

## 2023-03-22 DIAGNOSIS — B966 Bacteroides fragilis [B. fragilis] as the cause of diseases classified elsewhere: Secondary | ICD-10-CM | POA: Diagnosis present

## 2023-03-22 DIAGNOSIS — Z7985 Long-term (current) use of injectable non-insulin antidiabetic drugs: Secondary | ICD-10-CM

## 2023-03-22 DIAGNOSIS — R531 Weakness: Secondary | ICD-10-CM

## 2023-03-22 DIAGNOSIS — K219 Gastro-esophageal reflux disease without esophagitis: Secondary | ICD-10-CM | POA: Diagnosis present

## 2023-03-22 DIAGNOSIS — I480 Paroxysmal atrial fibrillation: Secondary | ICD-10-CM | POA: Diagnosis present

## 2023-03-22 DIAGNOSIS — E11621 Type 2 diabetes mellitus with foot ulcer: Secondary | ICD-10-CM | POA: Diagnosis present

## 2023-03-22 DIAGNOSIS — E1169 Type 2 diabetes mellitus with other specified complication: Secondary | ICD-10-CM | POA: Diagnosis present

## 2023-03-22 DIAGNOSIS — I119 Hypertensive heart disease without heart failure: Secondary | ICD-10-CM | POA: Diagnosis present

## 2023-03-22 DIAGNOSIS — N170 Acute kidney failure with tubular necrosis: Secondary | ICD-10-CM | POA: Diagnosis present

## 2023-03-22 DIAGNOSIS — E43 Unspecified severe protein-calorie malnutrition: Secondary | ICD-10-CM

## 2023-03-22 DIAGNOSIS — Z833 Family history of diabetes mellitus: Secondary | ICD-10-CM

## 2023-03-22 DIAGNOSIS — E1142 Type 2 diabetes mellitus with diabetic polyneuropathy: Secondary | ICD-10-CM | POA: Diagnosis present

## 2023-03-22 LAB — CBC WITH DIFFERENTIAL/PLATELET
Abs Immature Granulocytes: 0.07 10*3/uL (ref 0.00–0.07)
Basophils Absolute: 0 10*3/uL (ref 0.0–0.1)
Basophils Relative: 0 %
Eosinophils Absolute: 0.1 10*3/uL (ref 0.0–0.5)
Eosinophils Relative: 0 %
HCT: 29.9 % — ABNORMAL LOW (ref 39.0–52.0)
Hemoglobin: 9.3 g/dL — ABNORMAL LOW (ref 13.0–17.0)
Immature Granulocytes: 0 %
Lymphocytes Relative: 6 %
Lymphs Abs: 1 10*3/uL (ref 0.7–4.0)
MCH: 27.4 pg (ref 26.0–34.0)
MCHC: 31.1 g/dL (ref 30.0–36.0)
MCV: 87.9 fL (ref 80.0–100.0)
Monocytes Absolute: 0.9 10*3/uL (ref 0.1–1.0)
Monocytes Relative: 5 %
Neutro Abs: 15.8 10*3/uL — ABNORMAL HIGH (ref 1.7–7.7)
Neutrophils Relative %: 89 %
Platelets: 539 10*3/uL — ABNORMAL HIGH (ref 150–400)
RBC: 3.4 MIL/uL — ABNORMAL LOW (ref 4.22–5.81)
RDW: 15.1 % (ref 11.5–15.5)
WBC: 17.9 10*3/uL — ABNORMAL HIGH (ref 4.0–10.5)
nRBC: 0 % (ref 0.0–0.2)

## 2023-03-22 LAB — COMPREHENSIVE METABOLIC PANEL
ALT: 15 U/L (ref 0–44)
AST: 14 U/L — ABNORMAL LOW (ref 15–41)
Albumin: 2.1 g/dL — ABNORMAL LOW (ref 3.5–5.0)
Alkaline Phosphatase: 64 U/L (ref 38–126)
Anion gap: 20 — ABNORMAL HIGH (ref 5–15)
BUN: 26 mg/dL — ABNORMAL HIGH (ref 6–20)
CO2: 11 mmol/L — ABNORMAL LOW (ref 22–32)
Calcium: 8.7 mg/dL — ABNORMAL LOW (ref 8.9–10.3)
Chloride: 99 mmol/L (ref 98–111)
Creatinine, Ser: 1.52 mg/dL — ABNORMAL HIGH (ref 0.61–1.24)
GFR, Estimated: 55 mL/min — ABNORMAL LOW (ref >60)
Glucose, Bld: 405 mg/dL — ABNORMAL HIGH (ref 70–99)
Potassium: 5 mmol/L (ref 3.5–5.1)
Sodium: 130 mmol/L — ABNORMAL LOW (ref 135–145)
Total Bilirubin: 1.2 mg/dL (ref 0.3–1.2)
Total Protein: 8.1 g/dL (ref 6.5–8.1)

## 2023-03-22 LAB — URINALYSIS, ROUTINE W REFLEX MICROSCOPIC
Bilirubin Urine: NEGATIVE
Glucose, UA: >=500 mg/dL — AB
Ketones, ur: 20 mg/dL — AB
Nitrite: NEGATIVE
Protein, ur: 100 mg/dL — AB
Specific Gravity, Urine: 1.017 (ref 1.005–1.030)
WBC, UA: >50 WBC/hpf (ref 0–5)
pH: 5 (ref 5.0–8.0)

## 2023-03-22 LAB — C-REACTIVE PROTEIN: CRP: 30.8 mg/dL — ABNORMAL HIGH (ref <1.0)

## 2023-03-22 LAB — CBG MONITORING, ED: Glucose-Capillary: 416 mg/dL — ABNORMAL HIGH (ref 70–99)

## 2023-03-22 LAB — SEDIMENTATION RATE: Sed Rate: >140 mm/hr — ABNORMAL HIGH (ref 0–16)

## 2023-03-22 LAB — I-STAT CG4 LACTIC ACID, ED: Lactic Acid, Venous: 1.3 mmol/L (ref 0.5–1.9)

## 2023-03-22 MED ORDER — ONDANSETRON HCL 4 MG/2ML IJ SOLN
4.0000 mg | Freq: Four times a day (QID) | INTRAMUSCULAR | Status: AC
  Administered 2023-03-23 (×4): 4 mg via INTRAVENOUS
  Filled 2023-03-22 (×4): qty 2

## 2023-03-22 MED ORDER — ONDANSETRON HCL 4 MG/2ML IJ SOLN: 4.0000 mg | Freq: Four times a day (QID) | INTRAMUSCULAR | Status: DC | PRN

## 2023-03-22 MED ORDER — SODIUM CHLORIDE 0.9 % IV SOLN: 12.5000 mg | Freq: Four times a day (QID) | INTRAVENOUS | Status: DC

## 2023-03-22 MED ORDER — OXYCODONE-ACETAMINOPHEN 5-325 MG PO TABS
1.0000 | ORAL_TABLET | Freq: Once | ORAL | Status: AC
  Administered 2023-03-22: 1 via ORAL
  Filled 2023-03-22: qty 1

## 2023-03-22 MED ORDER — SEMAGLUTIDE(0.25 OR 0.5MG/DOS) 2 MG/3ML ~~LOC~~ SOPN: 0.5000 mg | PEN_INJECTOR | SUBCUTANEOUS | Status: DC

## 2023-03-22 MED ORDER — VANCOMYCIN HCL 1250 MG/250ML IV SOLN
1250.0000 mg | Freq: Two times a day (BID) | INTRAVENOUS | Status: DC
  Administered 2023-03-23 – 2023-03-26 (×6): 1250 mg via INTRAVENOUS
  Filled 2023-03-22 (×10): qty 250

## 2023-03-22 MED ORDER — SODIUM CHLORIDE 0.9 % IV SOLN: INTRAVENOUS | Status: DC

## 2023-03-22 MED ORDER — ONDANSETRON HCL 4 MG/2ML IJ SOLN
4.0000 mg | Freq: Once | INTRAMUSCULAR | Status: AC
  Administered 2023-03-22: 4 mg via INTRAVENOUS
  Filled 2023-03-22: qty 2

## 2023-03-22 MED ORDER — ACETAMINOPHEN 325 MG PO TABS
650.0000 mg | ORAL_TABLET | Freq: Once | ORAL | Status: AC
  Administered 2023-03-22: 650 mg via ORAL
  Filled 2023-03-22: qty 2

## 2023-03-22 MED ORDER — PANTOPRAZOLE SODIUM 40 MG IV SOLR
40.0000 mg | Freq: Once | INTRAVENOUS | Status: AC
  Administered 2023-03-23: 40 mg via INTRAVENOUS
  Filled 2023-03-22: qty 10

## 2023-03-22 MED ORDER — FENTANYL CITRATE PF 50 MCG/ML IJ SOSY
50.0000 ug | PREFILLED_SYRINGE | Freq: Once | INTRAMUSCULAR | Status: AC
  Administered 2023-03-22: 50 ug via INTRAVENOUS
  Filled 2023-03-22: qty 1

## 2023-03-22 MED ORDER — VANCOMYCIN HCL 1750 MG/350ML IV SOLN
1750.0000 mg | Freq: Once | INTRAVENOUS | Status: AC
  Administered 2023-03-22: 1750 mg via INTRAVENOUS
  Filled 2023-03-22: qty 350

## 2023-03-22 MED ORDER — PANTOPRAZOLE SODIUM 40 MG IV SOLR
40.0000 mg | INTRAVENOUS | Status: DC
  Administered 2023-03-23 – 2023-03-26 (×3): 40 mg via INTRAVENOUS
  Filled 2023-03-22 (×3): qty 10

## 2023-03-22 MED ORDER — BUPROPION HCL ER (XL) 150 MG PO TB24
150.0000 mg | ORAL_TABLET | Freq: Every day | ORAL | Status: DC
  Administered 2023-03-23 – 2023-04-07 (×16): 150 mg via ORAL
  Filled 2023-03-22 (×16): qty 1

## 2023-03-22 MED ORDER — INSULIN GLARGINE-YFGN 100 UNIT/ML ~~LOC~~ SOLN
30.0000 [IU] | Freq: Every day | SUBCUTANEOUS | Status: DC
  Filled 2023-03-22: qty 0.3

## 2023-03-22 MED ORDER — SODIUM CHLORIDE 0.9 % IV SOLN
2.0000 g | Freq: Once | INTRAVENOUS | Status: AC
  Administered 2023-03-22: 2 g via INTRAVENOUS
  Filled 2023-03-22: qty 12.5

## 2023-03-22 MED ORDER — LACTATED RINGERS IV BOLUS
1000.0000 mL | Freq: Once | INTRAVENOUS | Status: AC
  Administered 2023-03-22: 1000 mL via INTRAVENOUS

## 2023-03-22 MED ORDER — GABAPENTIN 300 MG PO CAPS
300.0000 mg | ORAL_CAPSULE | Freq: Two times a day (BID) | ORAL | Status: DC
  Administered 2023-03-23 – 2023-04-07 (×31): 300 mg via ORAL
  Filled 2023-03-22 (×31): qty 1

## 2023-03-22 MED ORDER — SODIUM CHLORIDE 0.9 % IV SOLN
2.0000 g | Freq: Three times a day (TID) | INTRAVENOUS | Status: DC
  Administered 2023-03-23 – 2023-03-26 (×10): 2 g via INTRAVENOUS
  Filled 2023-03-22 (×10): qty 12.5

## 2023-03-22 MED ORDER — METOCLOPRAMIDE HCL 5 MG/ML IJ SOLN
10.0000 mg | Freq: Four times a day (QID) | INTRAMUSCULAR | Status: DC
  Administered 2023-03-23 – 2023-03-30 (×26): 10 mg via INTRAVENOUS
  Filled 2023-03-22 (×26): qty 2

## 2023-03-22 MED ORDER — INSULIN ASPART 100 UNIT/ML IJ SOLN
0.0000 [IU] | INTRAMUSCULAR | Status: DC
  Administered 2023-03-23: 5 [IU] via SUBCUTANEOUS
  Administered 2023-03-23: 11 [IU] via SUBCUTANEOUS
  Administered 2023-03-23: 15 [IU] via SUBCUTANEOUS
  Administered 2023-03-23 (×2): 3 [IU] via SUBCUTANEOUS
  Administered 2023-03-23: 4 [IU] via SUBCUTANEOUS
  Administered 2023-03-24: 8 [IU] via SUBCUTANEOUS
  Administered 2023-03-24: 3 [IU] via SUBCUTANEOUS
  Administered 2023-03-24: 2 [IU] via SUBCUTANEOUS
  Administered 2023-03-24 – 2023-03-25 (×5): 3 [IU] via SUBCUTANEOUS
  Administered 2023-03-26: 8 [IU] via SUBCUTANEOUS
  Administered 2023-03-26: 5 [IU] via SUBCUTANEOUS

## 2023-03-22 MED ORDER — SODIUM CHLORIDE 0.9 % IV SOLN: 12.5000 mg | INTRAVENOUS | Status: DC | PRN

## 2023-03-22 MED ORDER — MORPHINE SULFATE (PF) 2 MG/ML IV SOLN
2.0000 mg | INTRAVENOUS | Status: DC | PRN
  Administered 2023-03-23 – 2023-04-05 (×10): 2 mg via INTRAVENOUS
  Filled 2023-03-22 (×10): qty 1

## 2023-03-22 NOTE — ED Provider Triage Note (Signed)
Emergency Medicine Provider Triage Evaluation Note  Taylor Hughes , a 52 y.o. male  was evaluated in triage.  Pt complains of foot pain. Hx of DM here with progressive worsening pain and infection of R foot x 1 month.  Now having fever, odor, discharge.  Review of Systems  Positive: As above Negative: As above  Physical Exam  BP 134/76   Pulse (!) 110   Temp 99.9 F (37.7 C)   Ht 5\' 11"  (1.803 m)   Wt 135.9 kg   SpO2 100%   BMI 41.79 kg/m  Gen:   Awake, no distress   Resp:  Normal effort  MSK:   Moves extremities without difficulty  Other:    Medical Decision Making  Medically screening exam initiated at 7:46 PM.  Appropriate orders placed.  Arren Edlund was informed that the remainder of the evaluation will be completed by another provider, this initial triage assessment does not replace that evaluation, and the importance of remaining in the ED until their evaluation is complete.  Diabetic foot ulcer   Fayrene Helper, PA-C 03/22/23 1947

## 2023-03-22 NOTE — Progress Notes (Signed)
Patient ID: Taylor Hughes, male   DOB: [birth year], 52 y.o.   MRN: [id number]  Consult received for a infected diabetic foot I will plan to reach out to Dr [name] in the morning to see if he will mind addressing this for the patient. Given that I am not aware of his OR schedule and time please make him NPO after MN in case of OR tomorrow

## 2023-03-22 NOTE — Progress Notes (Signed)
Pharmacy Antibiotic Note  Taylor Hughes is a 52 y.o. male admitted on 03/22/2023 with diabetic foot infection.  Pharmacy has been consulted for Vancomycin and Cefepime  dosing.  Vancomycin 1750 mg IV given in ED at 11 pm  Plan: Vancomycin 1250 mg IV q12h Cefepime 2 g IV q8h  Height: 5\' 11"  (180.3 cm) Weight: 135.9 kg (299 lb 9.7 oz) IBW/kg (Calculated) : 75.3  Temp (24hrs), Avg:99.9 F (37.7 C), Min:99.9 F (37.7 C), Max:99.9 F (37.7 C)  Recent Labs  Lab 03/22/23 2023 03/22/23 2151  WBC 17.9*  --   CREATININE 1.52*  --   LATICACIDVEN  --  1.3    Estimated Creatinine Clearance: 80.9 mL/min (A) (by C-G formula based on SCr of 1.52 mg/dL (H)).    No Known Allergies   Eddie Candle 03/22/2023 11:05 PM

## 2023-03-22 NOTE — ED Provider Notes (Signed)
[place] EMERGENCY DEPARTMENT AT [facility] Provider Note  MDM   HPI/ROS:  Taylor Hughes is a 52 y.o. male with diabetes presenting with chief complaint of wound infection.  Patient states his right toenail fell off last month, and he has since developed a smoldering wound that has been progressively more and more painful, swollen, and erythematous.  He also endorses subjective fevers over the past few days and has had some nausea and vomiting.  Patient has noticed an extremely foul odor, but has been trying to work through his symptoms to support his family.  Today, he decided this time to seek treatment because the infection seems to be getting out of hand.  Physical exam is notable for: - Ill-appearing, diaphoretic - Right foot with gangrenous, necrosis appearing toes with surrounding erythema and purulent drainage.  Extremely foul-smelling.  See pictures in patient's media tab for further details. - Right lower extremity exquisitely swollen  On my initial evaluation, patient is:  -Normotensive but tachycardic with rates in the low 100s.  Temperature of 99.9 oral.  Given the patient's history and physical exam, differential diagnosis includes but is not limited to osteomyelitis, cellulitis, necrotizing fasciitis, other unspecified soft tissue infection.  Exceedingly low concern for necrotizing fasciitis considering development of wound has been progressive and slow.  Interpretations, interventions, and the patient's course of care are documented below.    Workup obtained in triage includes CBC, CMP, urinalysis, blood cultures, i-STAT lactic acid, x-ray right foot.  Workup concerning for leukocytosis with a white count of 18, creatinine of 1.5 up from 1.17 several months ago.  X-ray concerning for osteomyelitis, consistent with history and physical exam.  Considering tachycardia, borderline fever, and leukocytosis, concern for sepsis.  IV antibiotics administered after cultures  were drawn.  Administered fluid resuscitation.  Inflammatory markers drawn.  Orthopedics consulted for surgical evaluation.  Patient was posted for admission to hospitalist for osteomyelitis complicated by sepsis.  Please see inpatient provider notes for further details.  Disposition:  I discussed the case with hospitalist who graciously agreed to admit the patient to their service for continued care.   Clinical Impression:  1. Osteomyelitis of right foot, unspecified type (HCC)     Rx / DC Orders ED Discharge Orders     None       The plan for this patient was discussed with Dr. Doran Durand, who voiced agreement and who oversaw evaluation and treatment of this patient.   Clinical Complexity A medically appropriate history, review of systems, and physical exam was performed.  My independent interpretations of EKG, labs, and radiology are documented in the ED course above.   Click here for ABCD2, HEART and other calculatorsREFRESH Note before signing   Patient's presentation is most consistent with acute presentation with potential threat to life or bodily function.  Medical Decision Making Amount and/or Complexity of Data Reviewed Labs: ordered. Radiology: ordered.  Risk Prescription drug management.    HPI/ROS      See MDM section for pertinent HPI and ROS. A complete ROS was performed with pertinent positives/negatives noted above.   Past Medical History:  Diagnosis Date   Atrial fibrillation (HCC)    Diabetes mellitus without complication (HCC)    Type 2   GERD (gastroesophageal reflux disease)    Wears glasses    Wound, open, foot    left diabetic     Past Surgical History:  Procedure Laterality Date   AMPUTATION Left 08/20/2018   Procedure: LEFT FOOT IRRIGATON AND  DEBRIDEMENT, 5TH RAY AMPUTATION;  Surgeon: Nadara Mustard, MD;  Location: [facility] OR;  Service: Orthopedics;  Laterality: Left;   APPLICATION OF A-CELL OF CHEST/ABDOMEN N/A 05/30/2019   Procedure:  Application Of A-Cell Of Chest;  Surgeon: Corliss Skains, MD;  Location: [facility] OR;  Service: Cardiothoracic;  Laterality: N/A;   APPLICATION OF WOUND VAC N/A 05/30/2019   Procedure: Application Of Wound Vac;  Surgeon: Corliss Skains, MD;  Location: [facility] OR;  Service: Cardiothoracic;  Laterality: N/A;   FINGER SURGERY Right 2016   I&D  small finger   I & D EXTREMITY Left 07/30/2018   Procedure: IRRIGATION AND DEBRIDEMENT LEFT FOOT WITH POSSIBLE AMPUTATION OF FIFTH TOE;  Surgeon: Kathryne Hitch, MD;  Location: [facility] ORS;  Service: Orthopedics;  Laterality: Left;   INCISION AND DRAINAGE ABSCESS Left 01/19/2019   Procedure: INCISION AND DRAINAGE ABSCESS upper chest;  Surgeon: Christia Reading, MD;  Location: [facility] OR;  Service: ENT;  Laterality: Left;   IRRIGATION AND DEBRIDEMENT STERNOCLAVICULAR JOINT-STERNUM AND RIBS N/A 05/30/2019   Procedure: IRRIGATION AND DEBRIDEMENT OF STERNOCLAVICULAR JOINT-STERNUM AND RIBS ;  Surgeon: Corliss Skains, MD;  Location: [facility] OR;  Service: Cardiothoracic;  Laterality: N/A;   MINOR IRRIGATION AND DEBRIDEMENT OF WOUND N/A 11/22/2018   Procedure: INCISION AND DRAINAGE OF NECK ABSCESS;  Surgeon: Christia Reading, MD;  Location: [facility] ORS;  Service: ENT;  Laterality: N/A;      Physical Exam   Vitals:   03/22/23 1928 03/22/23 1932  BP: 134/76   Pulse: (!) 110   Temp: 99.9 F (37.7 C)   SpO2: 100%   Weight:  135.9 kg  Height:  5\' 11"  (1.803 m)    Physical Exam Vitals and nursing note reviewed.  Constitutional:      General: He is not in acute distress.    Appearance: He is well-developed.  HENT:     Head: Normocephalic and atraumatic.  Eyes:     Conjunctiva/sclera: Conjunctivae normal.  Cardiovascular:     Rate and Rhythm: Normal rate and regular rhythm.     Heart sounds: No murmur heard. Pulmonary:     Effort: Pulmonary effort is normal. No respiratory distress.     Breath sounds: Normal breath sounds.  Abdominal:     Palpations: Abdomen is  soft.     Tenderness: There is no abdominal tenderness.  Musculoskeletal:        General: Swelling and tenderness present.     Cervical back: Neck supple.     Right lower leg: Edema present.  Skin:    General: Skin is warm and dry.     Capillary Refill: Capillary refill takes less than 2 seconds.     Findings: Lesion present.  Neurological:     Mental Status: He is alert.  Psychiatric:        Mood and Affect: Mood normal.    Starleen Arms, MD Department of Emergency Medicine   Please note that this documentation was produced with the assistance of voice-to-text technology and may contain errors.    Dyanne Iha, MD 03/22/23 1610    Glyn Ade, MD 03/22/23 2319

## 2023-03-22 NOTE — ED Triage Notes (Signed)
PT here via [facility] for R foot infection.  Hx of diabetes, states R toenail fell off in early July.  Now RLE is swollen and malodorous.  Hr 111 Bp 158/98 Cbg 182

## 2023-03-22 NOTE — H&P (Incomplete)
PCP:   Hoy Register, MD   Chief Complaint:  Right foot infection  HPI: This is a 52 year old male with past medical history of T2DM, peripheral neuropathy, atrial fibrillation, HTN, and GERD.  He presents with infection of his right lower extremity.  Per patient this has been ongoing approximately a month.  It started with the loss of the nail in his great toe.  The extremity became more painful, swollen, draining.  He denies any initiating trauma, wound, or blister.  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  Two weeks ago he developed subjective fevers and chills.  He became very lethargic, weak, laying in bed for hours at a time.  He has not been to work in 5 days.  His fingerstick blood sugars have been in the 200 range, however today he did not take his sliding scale insulin because he had no energy to do so.  He came to the ER.  In the ER Tmax 99.9.  HR 110, vitals stable.  WBC 17.9, creatinine 1.52 baseline normal, glucose 405, CO2 11, albumin 2.1, ESR >140, CRP 30.8, UA possible infection, lactic acid 1.3.  Xray right foot shows erosive changes of the distal phalanges of the first and second digits suspicious for osteomyelitis. Orthopedics on-call contacted, they will see patient in the evening  Review of Systems:  Per HPI.  Past Medical History: Past Medical History:  Diagnosis Date   Atrial fibrillation (HCC)    Diabetes mellitus without complication (HCC)    Type 2   GERD (gastroesophageal reflux disease)    Wears glasses    Wound, open, foot    left diabetic    Past Surgical History:  Procedure Laterality Date   AMPUTATION Left 08/20/2018   Procedure: LEFT FOOT IRRIGATON AND DEBRIDEMENT, 5TH RAY AMPUTATION;  Surgeon: Nadara Mustard, MD;  Location: [facility] OR;  Service: Orthopedics;  Laterality: Left;   APPLICATION OF A-CELL OF CHEST/ABDOMEN N/A 05/30/2019   Procedure: Application Of A-Cell Of Chest;  Surgeon: Corliss Skains, MD;  Location: [facility] OR;  Service: Cardiothoracic;  Laterality: N/A;   APPLICATION OF WOUND VAC N/A 05/30/2019   Procedure: Application Of Wound Vac;  Surgeon: Corliss Skains, MD;  Location: [facility] OR;  Service: Cardiothoracic;  Laterality: N/A;   FINGER SURGERY Right 2016   I&D  small finger   I & D EXTREMITY Left 07/30/2018   Procedure: IRRIGATION AND DEBRIDEMENT LEFT FOOT WITH POSSIBLE AMPUTATION OF FIFTH TOE;  Surgeon: Kathryne Hitch, MD;  Location: [facility] ORS;  Service: Orthopedics;  Laterality: Left;   INCISION AND DRAINAGE ABSCESS Left 01/19/2019   Procedure: INCISION AND DRAINAGE ABSCESS upper chest;  Surgeon: Christia Reading, MD;  Location: [facility] OR;  Service: ENT;  Laterality: Left;   IRRIGATION AND DEBRIDEMENT STERNOCLAVICULAR JOINT-STERNUM AND RIBS N/A 05/30/2019   Procedure: IRRIGATION AND DEBRIDEMENT OF STERNOCLAVICULAR JOINT-STERNUM AND RIBS ;  Surgeon: Corliss Skains, MD;  Location: [facility] OR;  Service: Cardiothoracic;  Laterality: N/A;   MINOR IRRIGATION AND DEBRIDEMENT OF WOUND N/A 11/22/2018   Procedure: INCISION AND DRAINAGE OF NECK ABSCESS;  Surgeon: Christia Reading, MD;  Location: [facility] ORS;  Service: ENT;  Laterality: N/A;    Medications: Prior to Admission medications   Medication Sig Start Date End Date Taking? Authorizing Provider  acetaminophen (TYLENOL) 325 MG tablet Take 650 mg by mouth every 6 (six) hours as needed for mild pain, fever or headache.  [provider]  albuterol (VENTOLIN HFA) 108 (90 Base) MCG/ACT inhaler Inhale 2 puffs into the lungs every 6 (six) hours as needed for wheezing or shortness of breath. 10/07/22   Hoy Register, MD  aspirin EC 81 MG tablet Take 81 mg by mouth daily. Swallow whole.    [provider]  atorvastatin (LIPITOR) 20 MG tablet Take 1 tablet (20 mg total) by mouth daily. 01/19/23   Hoy Register, MD  Blood Glucose Monitoring Suppl (TRUE METRIX METER) w/Device KIT Use to check blood sugar three times daily.  03/02/23   Hoy Register, MD  buPROPion (WELLBUTRIN XL) 150 MG 24 hr tablet Take 1 tablet (150 mg total) by mouth daily. 01/19/23   Hoy Register, MD  feeding supplement, GLUCERNA SHAKE, (GLUCERNA SHAKE) LIQD Take 237 mLs by mouth 3 (three) times daily between meals. 08/21/22   Rolly Salter, MD  furosemide (LASIX) 20 MG tablet Take 1 tablet (20 mg total) by mouth daily. 01/19/23   Hoy Register, MD  gabapentin (NEURONTIN) 300 MG capsule Take 1 capsule (300 mg total) by mouth 2 (two) times daily. 01/19/23   Hoy Register, MD  glucose blood (TRUE METRIX BLOOD GLUCOSE TEST) test strip Use as directed 02/04/23   Hoy Register, MD  HYDROcodone-acetaminophen (NORCO/VICODIN) 5-325 MG tablet Take 1 tablet every 6 hours by oral route as needed for 7 days. 10/31/22     Insulin Glargine (BASAGLAR KWIKPEN) 100 UNIT/ML Inject 30 Units into the skin daily. 01/19/23   Hoy Register, MD  insulin lispro (HUMALOG KWIKPEN) 100 UNIT/ML KwikPen Inject 0-12 units into the skin three times daily with meals. Per sliding scale. 01/19/23   Hoy Register, MD  Insulin Pen Needle 32G X 6 MM MISC USE AS DIRECTED 11/17/22   Hoy Register, MD  metoCLOPramide (REGLAN) 10 MG tablet Take 1 tablet (10 mg total) by mouth 3 (three) times daily before meals for 7 days. 08/21/22 08/28/22  Rolly Salter, MD  omeprazole (PRILOSEC) 40 MG capsule Take 1 capsule (40 mg total) by mouth daily. 01/19/23   Hoy Register, MD  Semaglutide, 1 MG/DOSE, 4 MG/3ML SOPN Inject 1 mg as directed once a week. 01/19/23   Hoy Register, MD  Semaglutide,0.25 or 0.5MG /DOS, (OZEMPIC, 0.25 OR 0.5 MG/DOSE,) 2 MG/3ML SOPN Inject 0.5 mg into the skin once a week. For 4 weeks then increase to 1mg  01/19/23   Hoy Register, MD  sildenafil (VIAGRA) 50 MG tablet Take 1 tablet (50 mg total) by mouth daily as needed for erectile dysfunction. At least 24 hours between doses 03/12/23   Hoy Register, MD  TRUEplus Lancets 28G MISC Use to check blood sugar three times  daily. 03/02/23   Hoy Register, MD  valsartan-hydrochlorothiazide (DIOVAN-HCT) 160-25 MG tablet Take 1 tablet by mouth daily. 01/19/23   Hoy Register, MD    Allergies:  No Known Allergies  Social History:  reports that he has never smoked. He has never used smokeless tobacco. He reports that he does not drink alcohol and does not use drugs.  Family History: Family History  Problem Relation Age of Onset   Diabetes Mother     Physical Exam: Vitals:   03/22/23 1928 03/22/23 1932  BP: 134/76   Pulse: (!) 110   Temp: 99.9 F (37.7 C)   SpO2: 100%   Weight:  135.9 kg  Height:  5\' 11"  (1.803 m)    General:  Alert and oriented times three, extreme morbid obesity no acute distress Eyes: PERRLA, pink conjunctiva, no  scleral icterus ENT: Moist oral mucosa, neck supple, no thyromegaly Lungs: CTA space B/L, no wheeze, no crackles, no use of accessory muscles Cardiovascular: Tachycardia, RRR, no regurgitation, no gallops, no murmurs. No carotid bruits, no JVD Abdomen: soft, positive BS, NTND, not an acute abdomen GU: not examined Neuro: CN II - XII grossly intact Musculoskeletal: Chronic lymphedema B/L, R>>L with chronic skin/vascular changes Skin: Right foot anterior metatarsal including all 5 toes with drainage, maceration, some areas of open slivers, swollen, infected appearing foot Psych: Anxious patient  Labs on Admission:  Recent Labs    03/22/23 2023  NA 130*  K 5.0  CL 99  CO2 11*  GLUCOSE 405*  BUN 26*  CREATININE 1.52*  CALCIUM 8.7*   Recent Labs    03/22/23 2023  AST 14*  ALT 15  ALKPHOS 64  BILITOT 1.2  PROT 8.1  ALBUMIN 2.1*    Recent Labs    03/22/23 2023  WBC 17.9*  NEUTROABS 15.8*  HGB 9.3*  HCT 29.9*  MCV 87.9  PLT 539*    Radiological Exams on Admission: DG Foot Complete Right  Result Date: 03/22/2023 CLINICAL DATA:  Possible diabetic infection. EXAM: RIGHT FOOT COMPLETE - 3+ VIEW COMPARISON:  None Available. FINDINGS: Erosive  changes of the distal phalanges of the first and second digits. In the absence of history of amputation findings suspicious for osteomyelitis. No acute fracture. There is no dislocation. The bones are osteopenic. Diffuse skin thickening and soft tissue edema. Dressing noted over the forefoot. IMPRESSION: Erosive changes of the distal phalanges of the first and second digits suspicious for osteomyelitis. Electronically Signed   By: Elgie Collard M.D.   On: 03/22/2023 20:11    Assessment/Plan Present on Admission:  Osteomyelitis right foot // Sepsis with endorgan dysfunction -Ortho to see in AM.  Per EDP no additional imaging needed.  MRI not ordered. -Blood cultures x 2 ordered -IV vancomycin, cefepime pharmacy to dose   Type 2 diabetes mellitus with other specified complication, uncontrolled -Concern for early DKA.  VBG, beta hydroxybutyric acid ordered, 1 L NS bolus ordered.  Patient received 1 L LR bolus in ER. -Bicarb drip initiated -NPO   Nausea and vomiting //   Gastroenteritis -Secondary to infection and gastroparesis -Scheduled Reglan, Zofran ordered.  -As needed Phenergan.   AKI -Aggressive IV fluid hydration   Essential hypertension -Diovan on hold, given patient's dehydration. -As needed Lopressor   HLD -Statin on hold   Jodie Leiner 03/22/2023, 11:19 PM

## 2023-03-23 ENCOUNTER — Inpatient Hospital Stay (HOSPITAL_COMMUNITY): Payer: PRIVATE HEALTH INSURANCE

## 2023-03-23 DIAGNOSIS — Z794 Long term (current) use of insulin: Secondary | ICD-10-CM | POA: Diagnosis not present

## 2023-03-23 DIAGNOSIS — R609 Edema, unspecified: Secondary | ICD-10-CM

## 2023-03-23 DIAGNOSIS — E1165 Type 2 diabetes mellitus with hyperglycemia: Secondary | ICD-10-CM | POA: Diagnosis present

## 2023-03-23 DIAGNOSIS — I119 Hypertensive heart disease without heart failure: Secondary | ICD-10-CM | POA: Diagnosis present

## 2023-03-23 DIAGNOSIS — E872 Acidosis, unspecified: Secondary | ICD-10-CM | POA: Diagnosis present

## 2023-03-23 DIAGNOSIS — E1152 Type 2 diabetes mellitus with diabetic peripheral angiopathy with gangrene: Secondary | ICD-10-CM | POA: Diagnosis present

## 2023-03-23 DIAGNOSIS — L089 Local infection of the skin and subcutaneous tissue, unspecified: Secondary | ICD-10-CM | POA: Diagnosis not present

## 2023-03-23 DIAGNOSIS — I890 Lymphedema, not elsewhere classified: Secondary | ICD-10-CM | POA: Diagnosis not present

## 2023-03-23 DIAGNOSIS — E860 Dehydration: Secondary | ICD-10-CM | POA: Diagnosis present

## 2023-03-23 DIAGNOSIS — F32A Depression, unspecified: Secondary | ICD-10-CM | POA: Diagnosis present

## 2023-03-23 DIAGNOSIS — Z6841 Body Mass Index (BMI) 40.0 and over, adult: Secondary | ICD-10-CM | POA: Diagnosis not present

## 2023-03-23 DIAGNOSIS — M869 Osteomyelitis, unspecified: Secondary | ICD-10-CM | POA: Diagnosis not present

## 2023-03-23 DIAGNOSIS — R008 Other abnormalities of heart beat: Secondary | ICD-10-CM | POA: Diagnosis not present

## 2023-03-23 DIAGNOSIS — L02611 Cutaneous abscess of right foot: Secondary | ICD-10-CM | POA: Diagnosis present

## 2023-03-23 DIAGNOSIS — E11628 Type 2 diabetes mellitus with other skin complications: Secondary | ICD-10-CM | POA: Diagnosis present

## 2023-03-23 DIAGNOSIS — R000 Tachycardia, unspecified: Secondary | ICD-10-CM | POA: Diagnosis not present

## 2023-03-23 DIAGNOSIS — E1143 Type 2 diabetes mellitus with diabetic autonomic (poly)neuropathy: Secondary | ICD-10-CM | POA: Diagnosis present

## 2023-03-23 DIAGNOSIS — I480 Paroxysmal atrial fibrillation: Secondary | ICD-10-CM | POA: Diagnosis present

## 2023-03-23 DIAGNOSIS — R6520 Severe sepsis without septic shock: Secondary | ICD-10-CM | POA: Diagnosis present

## 2023-03-23 DIAGNOSIS — Z1152 Encounter for screening for COVID-19: Secondary | ICD-10-CM | POA: Diagnosis not present

## 2023-03-23 DIAGNOSIS — I739 Peripheral vascular disease, unspecified: Secondary | ICD-10-CM | POA: Diagnosis not present

## 2023-03-23 DIAGNOSIS — D529 Folate deficiency anemia, unspecified: Secondary | ICD-10-CM | POA: Diagnosis present

## 2023-03-23 DIAGNOSIS — M86171 Other acute osteomyelitis, right ankle and foot: Secondary | ICD-10-CM | POA: Diagnosis present

## 2023-03-23 DIAGNOSIS — I471 Supraventricular tachycardia, unspecified: Secondary | ICD-10-CM | POA: Diagnosis present

## 2023-03-23 DIAGNOSIS — E11649 Type 2 diabetes mellitus with hypoglycemia without coma: Secondary | ICD-10-CM | POA: Diagnosis not present

## 2023-03-23 DIAGNOSIS — N170 Acute kidney failure with tubular necrosis: Secondary | ICD-10-CM | POA: Diagnosis present

## 2023-03-23 DIAGNOSIS — E1142 Type 2 diabetes mellitus with diabetic polyneuropathy: Secondary | ICD-10-CM | POA: Diagnosis present

## 2023-03-23 DIAGNOSIS — E43 Unspecified severe protein-calorie malnutrition: Secondary | ICD-10-CM | POA: Diagnosis present

## 2023-03-23 DIAGNOSIS — I4892 Unspecified atrial flutter: Secondary | ICD-10-CM | POA: Diagnosis present

## 2023-03-23 DIAGNOSIS — E1169 Type 2 diabetes mellitus with other specified complication: Secondary | ICD-10-CM | POA: Diagnosis present

## 2023-03-23 DIAGNOSIS — A419 Sepsis, unspecified organism: Secondary | ICD-10-CM | POA: Diagnosis present

## 2023-03-23 DIAGNOSIS — M86271 Subacute osteomyelitis, right ankle and foot: Secondary | ICD-10-CM | POA: Diagnosis not present

## 2023-03-23 DIAGNOSIS — I10 Essential (primary) hypertension: Secondary | ICD-10-CM | POA: Diagnosis not present

## 2023-03-23 DIAGNOSIS — Z89431 Acquired absence of right foot: Secondary | ICD-10-CM | POA: Diagnosis not present

## 2023-03-23 DIAGNOSIS — I517 Cardiomegaly: Secondary | ICD-10-CM | POA: Diagnosis not present

## 2023-03-23 LAB — BRAIN NATRIURETIC PEPTIDE: B Natriuretic Peptide: 462.8 pg/mL — ABNORMAL HIGH (ref 0.0–100.0)

## 2023-03-23 LAB — PHOSPHORUS: Phosphorus: 2.8 mg/dL (ref 2.5–4.6)

## 2023-03-23 LAB — COMPREHENSIVE METABOLIC PANEL
ALT: 13 U/L (ref 0–44)
AST: 11 U/L — ABNORMAL LOW (ref 15–41)
Albumin: 1.8 g/dL — ABNORMAL LOW (ref 3.5–5.0)
Alkaline Phosphatase: 56 U/L (ref 38–126)
Anion gap: 18 — ABNORMAL HIGH (ref 5–15)
BUN: 28 mg/dL — ABNORMAL HIGH (ref 6–20)
CO2: 12 mmol/L — ABNORMAL LOW (ref 22–32)
Calcium: 8.7 mg/dL — ABNORMAL LOW (ref 8.9–10.3)
Chloride: 102 mmol/L (ref 98–111)
Creatinine, Ser: 1.7 mg/dL — ABNORMAL HIGH (ref 0.61–1.24)
GFR, Estimated: 48 mL/min — ABNORMAL LOW (ref >60)
Glucose, Bld: 410 mg/dL — ABNORMAL HIGH (ref 70–99)
Potassium: 4.3 mmol/L (ref 3.5–5.1)
Sodium: 132 mmol/L — ABNORMAL LOW (ref 135–145)
Total Bilirubin: 1.1 mg/dL (ref 0.3–1.2)
Total Protein: 7.1 g/dL (ref 6.5–8.1)

## 2023-03-23 LAB — CBC WITH DIFFERENTIAL/PLATELET
Abs Immature Granulocytes: 0.15 10*3/uL — ABNORMAL HIGH (ref 0.00–0.07)
Basophils Absolute: 0 10*3/uL (ref 0.0–0.1)
Basophils Relative: 0 %
Eosinophils Absolute: 0 10*3/uL (ref 0.0–0.5)
Eosinophils Relative: 0 %
HCT: 25.8 % — ABNORMAL LOW (ref 39.0–52.0)
Hemoglobin: 8.2 g/dL — ABNORMAL LOW (ref 13.0–17.0)
Immature Granulocytes: 1 %
Lymphocytes Relative: 5 %
Lymphs Abs: 0.9 10*3/uL (ref 0.7–4.0)
MCH: 27.2 pg (ref 26.0–34.0)
MCHC: 31.8 g/dL (ref 30.0–36.0)
MCV: 85.4 fL (ref 80.0–100.0)
Monocytes Absolute: 1 10*3/uL (ref 0.1–1.0)
Monocytes Relative: 5 %
Neutro Abs: 17 10*3/uL — ABNORMAL HIGH (ref 1.7–7.7)
Neutrophils Relative %: 89 %
Platelets: 469 10*3/uL — ABNORMAL HIGH (ref 150–400)
RBC: 3.02 MIL/uL — ABNORMAL LOW (ref 4.22–5.81)
RDW: 14.9 % (ref 11.5–15.5)
WBC: 19.1 10*3/uL — ABNORMAL HIGH (ref 4.0–10.5)
nRBC: 0 % (ref 0.0–0.2)

## 2023-03-23 LAB — GLUCOSE, CAPILLARY
Glucose-Capillary: 302 mg/dL — ABNORMAL HIGH (ref 70–99)
Glucose-Capillary: 208 mg/dL — ABNORMAL HIGH (ref 70–99)
Glucose-Capillary: 174 mg/dL — ABNORMAL HIGH (ref 70–99)
Glucose-Capillary: 180 mg/dL — ABNORMAL HIGH (ref 70–99)
Glucose-Capillary: 176 mg/dL — ABNORMAL HIGH (ref 70–99)

## 2023-03-23 LAB — BLOOD GAS, VENOUS
Acid-base deficit: 11.5 mmol/L — ABNORMAL HIGH (ref 0.0–2.0)
Bicarbonate: 13.3 mmol/L — ABNORMAL LOW (ref 20.0–28.0)
O2 Saturation: 81.7 %
Patient temperature: 37
pCO2, Ven: 27 mmHg — ABNORMAL LOW (ref 44–60)
pH, Ven: 7.3 (ref 7.25–7.43)
pO2, Ven: 49 mmHg — ABNORMAL HIGH (ref 32–45)

## 2023-03-23 LAB — CBC
HCT: 27.3 % — ABNORMAL LOW (ref 39.0–52.0)
Hemoglobin: 8.6 g/dL — ABNORMAL LOW (ref 13.0–17.0)
MCH: 27.3 pg (ref 26.0–34.0)
MCHC: 31.5 g/dL (ref 30.0–36.0)
MCV: 86.7 fL (ref 80.0–100.0)
Platelets: 497 10*3/uL — ABNORMAL HIGH (ref 150–400)
RBC: 3.15 MIL/uL — ABNORMAL LOW (ref 4.22–5.81)
RDW: 15.2 % (ref 11.5–15.5)
WBC: 19.8 10*3/uL — ABNORMAL HIGH (ref 4.0–10.5)
nRBC: 0 % (ref 0.0–0.2)

## 2023-03-23 LAB — BETA-HYDROXYBUTYRIC ACID: Beta-Hydroxybutyric Acid: 6.04 mmol/L — ABNORMAL HIGH (ref 0.05–0.27)

## 2023-03-23 LAB — SARS CORONAVIRUS 2 BY RT PCR: SARS Coronavirus 2 by RT PCR: NEGATIVE

## 2023-03-23 LAB — MAGNESIUM: Magnesium: 1.9 mg/dL (ref 1.7–2.4)

## 2023-03-23 MED ORDER — LACTATED RINGERS IV BOLUS
1000.0000 mL | Freq: Once | INTRAVENOUS | Status: AC
  Administered 2023-03-23: 1000 mL via INTRAVENOUS

## 2023-03-23 MED ORDER — SODIUM CHLORIDE 0.45 % IV SOLN
INTRAVENOUS | Status: DC
  Filled 2023-03-23: qty 75

## 2023-03-23 MED ORDER — METOPROLOL TARTRATE 5 MG/5ML IV SOLN
5.0000 mg | INTRAVENOUS | Status: AC | PRN
  Administered 2023-03-23 – 2023-03-24 (×3): 5 mg via INTRAVENOUS
  Filled 2023-03-23 (×3): qty 5

## 2023-03-23 MED ORDER — INSULIN ASPART 100 UNIT/ML IJ SOLN: 12.0000 [IU] | Freq: Once | INTRAMUSCULAR | Status: DC

## 2023-03-23 MED ORDER — HEPARIN SODIUM (PORCINE) 5000 UNIT/ML IJ SOLN
5000.0000 [IU] | Freq: Three times a day (TID) | INTRAMUSCULAR | Status: DC
  Administered 2023-03-23 – 2023-04-07 (×45): 5000 [IU] via SUBCUTANEOUS
  Filled 2023-03-23 (×45): qty 1

## 2023-03-23 MED ORDER — STERILE WATER FOR INJECTION IV SOLN
INTRAVENOUS | Status: DC
  Filled 2023-03-23 (×3): qty 1000

## 2023-03-23 MED ORDER — INSULIN GLARGINE-YFGN 100 UNIT/ML ~~LOC~~ SOLN
30.0000 [IU] | Freq: Every day | SUBCUTANEOUS | Status: DC
  Administered 2023-03-23 – 2023-03-24 (×3): 30 [IU] via SUBCUTANEOUS
  Filled 2023-03-23 (×8): qty 0.3

## 2023-03-23 MED ORDER — PROMETHAZINE HCL 25 MG RE SUPP: 25.0000 mg | Freq: Four times a day (QID) | RECTAL | Status: DC | PRN

## 2023-03-23 MED ORDER — INSULIN ASPART 100 UNIT/ML IJ SOLN
10.0000 [IU] | Freq: Once | INTRAMUSCULAR | Status: AC
  Administered 2023-03-23: 10 [IU] via SUBCUTANEOUS

## 2023-03-23 MED ORDER — SODIUM CHLORIDE 0.9 % IV BOLUS
1000.0000 mL | Freq: Once | INTRAVENOUS | Status: AC
  Administered 2023-03-23: 1000 mL via INTRAVENOUS

## 2023-03-23 MED ORDER — HEPARIN BOLUS VIA INFUSION
4000.0000 [IU] | Freq: Once | INTRAVENOUS | Status: DC
  Filled 2023-03-23: qty 4000

## 2023-03-23 MED ORDER — METRONIDAZOLE 500 MG PO TABS
500.0000 mg | ORAL_TABLET | Freq: Two times a day (BID) | ORAL | Status: DC
  Administered 2023-03-23 – 2023-03-30 (×15): 500 mg via ORAL
  Filled 2023-03-23 (×15): qty 1

## 2023-03-23 MED ORDER — HEPARIN (PORCINE) 25000 UT/250ML-% IV SOLN
1650.0000 [IU]/h | INTRAVENOUS | Status: DC
  Filled 2023-03-23: qty 250

## 2023-03-23 MED ORDER — PROMETHAZINE (PHENERGAN) 6.25MG IN NS 50ML IVPB: 6.2500 mg | Freq: Four times a day (QID) | INTRAVENOUS | Status: DC | PRN

## 2023-03-23 MED ORDER — ACETAMINOPHEN 325 MG PO TABS
650.0000 mg | ORAL_TABLET | Freq: Four times a day (QID) | ORAL | Status: DC | PRN
  Administered 2023-03-23 – 2023-03-25 (×4): 650 mg via ORAL
  Filled 2023-03-23 (×4): qty 2

## 2023-03-23 MED ORDER — SENNOSIDES-DOCUSATE SODIUM 8.6-50 MG PO TABS: 1.0000 | ORAL_TABLET | Freq: Every evening | ORAL | Status: DC | PRN

## 2023-03-23 MED ORDER — TECHNETIUM TO 99M ALBUMIN AGGREGATED
4.2000 | Freq: Once | INTRAVENOUS | Status: AC | PRN
  Administered 2023-03-23: 4.2 via INTRAVENOUS

## 2023-03-23 MED ORDER — PROMETHAZINE HCL 12.5 MG PO TABS: 25.0000 mg | ORAL_TABLET | Freq: Four times a day (QID) | ORAL | Status: DC | PRN

## 2023-03-23 MED ORDER — ACETAMINOPHEN 650 MG RE SUPP: 650.0000 mg | Freq: Four times a day (QID) | RECTAL | Status: DC | PRN

## 2023-03-23 MED ORDER — HEPARIN SODIUM (PORCINE) 5000 UNIT/ML IJ SOLN: 5000.0000 [IU] | Freq: Three times a day (TID) | INTRAMUSCULAR | Status: DC

## 2023-03-23 NOTE — Progress Notes (Signed)
Per vital signs this am, pt is tachycardic of 128. Assess the pt and he is not in any pain and is resting comfortably and watching tv.  I sent a note to the dr ie  Dr [name] to make him aware

## 2023-03-23 NOTE — Progress Notes (Addendum)
ANTICOAGULATION CONSULT NOTE - Initial Consult  Pharmacy Consult for heparin Indication: R/o PE  No Known Allergies  Patient Measurements: Height: 5\' 11"  (180.3 cm) Weight: 136 kg (299 lb 13.2 oz) IBW/kg (Calculated) : 75.3 Heparin Dosing Weight: 106.7 kg  Vital Signs: Temp: 98.4 F (36.9 C) (08/12 0828) Temp Source: Oral (08/12 0528) BP: 128/68 (08/12 1055) Pulse Rate: 131 (08/12 1055)  Labs: Recent Labs    03/22/23 2023 03/23/23 0005 03/23/23 0227  HGB 9.3* 8.6* 8.2*  HCT 29.9* 27.3* 25.8*  PLT 539* 497* 469*  CREATININE 1.52*  --  1.70*    Estimated Creatinine Clearance: 72.4 mL/min (A) (by C-G formula based on SCr of 1.7 mg/dL (H)).   Medical History: Past Medical History:  Diagnosis Date   Atrial fibrillation (HCC)    Diabetes mellitus without complication (HCC)    Type 2   GERD (gastroesophageal reflux disease)    Wears glasses    Wound, open, foot    left diabetic     Medications:  Scheduled:   buPROPion  150 mg Oral Daily   gabapentin  300 mg Oral BID   insulin aspart  0-15 Units Subcutaneous Q4H   insulin glargine-yfgn  30 Units Subcutaneous QHS   metoCLOPramide (REGLAN) injection  10 mg Intravenous Q6H   metroNIDAZOLE  500 mg Oral Q12H   ondansetron (ZOFRAN) IV  4 mg Intravenous Q6H   pantoprazole (PROTONIX) IV  40 mg Intravenous Q24H   Semaglutide(0.25 or 0.5MG /DOS)  0.5 mg Subcutaneous Weekly    Assessment: 52 yo male presented with RLE edema/infection, concern for osteomyelitis on imaging. Now with tachycardia up to 130s. Plan to r/o PE and DVT given significant RLE edema and tachycardia. No anticoagulation PTA. Pharmacy has been consulted to dose IV heparin until VTE can be ruled out.    Hgb 8.2, plts elevated.  Goal of Therapy:  Heparin level 0.3-0.7 units/ml Monitor platelets by anticoagulation protocol: Yes   Plan:  Small heparin bolus 4000 units IV x1, followed by Heparin 1650 units/hr F/u heparin level in 6 hours F/u V/Q  scan, lower extremity dopplers Monitor plans for OR and if anticoagulation needs to be held  Rexford Maus, PharmD, BCPS 03/23/2023 11:24 AM

## 2023-03-23 NOTE — Progress Notes (Signed)
Ankle-Brachial Index completed. Please see CV Procedures for preliminary results.  Shona Simpson, RVT 03/23/23 1:38 PM

## 2023-03-23 NOTE — Progress Notes (Signed)
Lower extremity venous duplex completed. Please see CV Procedures for preliminary results.  Shona Simpson, RVT 03/23/23 1:37 PM

## 2023-03-23 NOTE — Consult Note (Signed)
WOC Nurse Consult Note: Reason for Consult:Interdigital irritant contact dermatitis to right foot with maceration and partial thickness open lesions. Simultaneously consulted with Orthopedics who has not yet seen. Previous operative procedure to contralateral foot by Dr. [name] in 2020. Wound type: moisture, infection Pressure Injury POA: N/A Measurement:See also photo uploaded to EMR Wound bed:red, moist Drainage (amount, consistency, odor) serous Periwound:maceration, edema Dressing procedure/placement/frequency: I have providfed Nursing with guidance for the topical care of these areas using a saline cleanse and dry followed by placement of silver hydrofiber strips (Aquacel Ag+ Advantage) placed between digits and over open lesions to manage moisture. This is to be covered with dry gauze and secured with a few turns of Kerlix roll gauze/paper tape. Heels are to be floated.  Any orders I have provided will be superceded by those of a Provider.  WOC nursing team will not follow, but will remain available to this patient, the nursing and medical teams.  Please re-consult if needed.  Thank you for inviting [place] to participate in this patient's Plan of Care.  Ladona Mow, MSN, RN, CNS, GNP, Leda Min, [employer], [employer] phone:  [phone]

## 2023-03-23 NOTE — ED Notes (Signed)
ED TO INPATIENT HANDOFF REPORT  ED Nurse Name and Phone #:  Sujay Grundman [phone]  S Name/Age/Gender Taylor Hughes 52 y.o. male Room/Bed: 021C/021C  Code Status   Code Status: Full Code  Home/SNF/Other Home Patient oriented to: self, place, time, and situation Is this baseline? Yes   Triage Complete: Triage complete  Chief Complaint Diabetic foot infection (HCC) [W29.562, L08.9]  Triage Note PT here via [facility] for R foot infection.  Hx of diabetes, states R toenail fell off in early July.  Now RLE is swollen and malodorous.  Hr 111 Bp 158/98 Cbg 182   Allergies No Known Allergies  Level of Care/Admitting Diagnosis ED Disposition     ED Disposition  Admit   Condition  --   Comment  Hospital Area: [facility] [[id number]]  Level of Care: Med-Surg [16]  May admit patient to [facility] or [facility] if equivalent level of care is available:: No  Covid Evaluation: Asymptomatic - no recent exposure (last 10 days) testing not required  Diagnosis: Diabetic foot infection [facility]) [[id number]]  Admitting Physician: Gery Pray [[id number]]  Attending Physician: Gery Pray [[id number]]  Certification:: I certify this patient will need inpatient services for at least 2 midnights  Estimated Length of Stay: 2          B Medical/Surgery History Past Medical History:  Diagnosis Date   Atrial fibrillation (HCC)    Diabetes mellitus without complication (HCC)    Type 2   GERD (gastroesophageal reflux disease)    Wears glasses    Wound, open, foot    left diabetic    Past Surgical History:  Procedure Laterality Date   AMPUTATION Left 08/20/2018   Procedure: LEFT FOOT IRRIGATON AND DEBRIDEMENT, 5TH RAY AMPUTATION;  Surgeon: Nadara Mustard, MD;  Location: [facility] OR;  Service: Orthopedics;  Laterality: Left;   APPLICATION OF A-CELL OF CHEST/ABDOMEN N/A 05/30/2019   Procedure: Application Of A-Cell Of Chest;  Surgeon: Corliss Skains, MD;  Location: [facility] OR;  Service:  Cardiothoracic;  Laterality: N/A;   APPLICATION OF WOUND VAC N/A 05/30/2019   Procedure: Application Of Wound Vac;  Surgeon: Corliss Skains, MD;  Location: [facility] OR;  Service: Cardiothoracic;  Laterality: N/A;   FINGER SURGERY Right 2016   I&D  small finger   I & D EXTREMITY Left 07/30/2018   Procedure: IRRIGATION AND DEBRIDEMENT LEFT FOOT WITH POSSIBLE AMPUTATION OF FIFTH TOE;  Surgeon: Kathryne Hitch, MD;  Location: [facility] ORS;  Service: Orthopedics;  Laterality: Left;   INCISION AND DRAINAGE ABSCESS Left 01/19/2019   Procedure: INCISION AND DRAINAGE ABSCESS upper chest;  Surgeon: Christia Reading, MD;  Location: [facility] OR;  Service: ENT;  Laterality: Left;   IRRIGATION AND DEBRIDEMENT STERNOCLAVICULAR JOINT-STERNUM AND RIBS N/A 05/30/2019   Procedure: IRRIGATION AND DEBRIDEMENT OF STERNOCLAVICULAR JOINT-STERNUM AND RIBS ;  Surgeon: Corliss Skains, MD;  Location: [facility] OR;  Service: Cardiothoracic;  Laterality: N/A;   MINOR IRRIGATION AND DEBRIDEMENT OF WOUND N/A 11/22/2018   Procedure: INCISION AND DRAINAGE OF NECK ABSCESS;  Surgeon: Christia Reading, MD;  Location: [facility] ORS;  Service: ENT;  Laterality: N/A;     A IV Location/Drains/Wounds Patient Lines/Drains/Airways Status     Active Line/Drains/Airways     Name Placement date Placement time Site Days   Peripheral IV 03/01/23 20 G Left Antecubital 03/01/23  1817  Antecubital  22   Peripheral IV 03/22/23 20 G Anterior;Left Wrist 03/22/23  2107  Wrist  1  Intake/Output Last 24 hours  Intake/Output Summary (Last 24 hours) at 03/23/2023 0022 Last data filed at 03/22/2023 2253 Gross per 24 hour  Intake 103.27 ml  Output --  Net 103.27 ml    Labs/Imaging Results for orders placed or performed during the hospital encounter of 03/22/23 (from the past 48 hour(s))  Comprehensive metabolic panel     Status: Abnormal   Collection Time: 03/22/23  8:23 PM  Result Value Ref Range   Sodium 130 (L) 135 - 145 mmol/L   Potassium  5.0 3.5 - 5.1 mmol/L   [place]loride 99 98 - 111 mmol/L   CO2 11 (L) 22 - 32 mmol/L   Glucose, Bld 405 (H) 70 - 99 mg/dL    Comment: Glucose reference range applies only to samples taken after fasting for at least 8 hours.   BUN 26 (H) 6 - 20 mg/dL   Creatinine, Ser 1.66 (H) 0.61 - 1.24 mg/dL   Calcium 8.7 (L) 8.9 - 10.3 mg/dL   Total Protein 8.1 6.5 - 8.1 g/dL   Albumin 2.1 (L) 3.5 - 5.0 g/dL   AST 14 (L) 15 - 41 U/L   ALT 15 0 - 44 U/L   Alkaline Phosphatase 64 38 - 126 U/L   Total Bilirubin 1.2 0.3 - 1.2 mg/dL   GFR, Estimated 55 (L) >60 mL/min    Comment: (NOTE) Calculated using the CKD-EPI Creatinine Equation (2021)    Anion gap 20 (H) 5 - 15    Comment: Performed at [facility], [address] [place], [place] [address]  CBC with Differential     Status: Abnormal   Collection Time: 03/22/23  8:23 PM  Result Value Ref Range   WBC 17.9 (H) 4.0 - 10.5 K/uL   RBC 3.40 (L) 4.22 - 5.81 MIL/uL   Hemoglobin 9.3 (L) 13.0 - 17.0 g/dL   HCT 60.1 (L) 09.3 - 23.5 %   MCV 87.9 80.0 - 100.0 fL   MCH 27.4 26.0 - 34.0 pg   MCHC 31.1 30.0 - 36.0 g/dL   RDW 57.3 22.0 - 25.4 %   Platelets 539 (H) 150 - 400 K/uL   nRBC 0.0 0.0 - 0.2 %   Neutrophils Relative % 89 %   Neutro Abs 15.8 (H) 1.7 - 7.7 K/uL   Lymphocytes Relative 6 %   Lymphs Abs 1.0 0.7 - 4.0 K/uL   Monocytes Relative 5 %   Monocytes Absolute 0.9 0.1 - 1.0 K/uL   Eosinophils Relative 0 %   Eosinophils Absolute 0.1 0.0 - 0.5 K/uL   Basophils Relative 0 %   Basophils Absolute 0.0 0.0 - 0.1 K/uL   Immature Granulocytes 0 %   Abs Immature Granulocytes 0.07 0.00 - 0.07 K/uL    Comment: Performed at [facility], [address], [place], [place] [address]  I-Stat Lactic Acid, ED     Status: None   Collection Time: 03/22/23  9:51 PM  Result Value Ref Range   Lactic Acid, Venous 1.3 0.5 - 1.9 mmol/L  C-reactive protein     Status: Abnormal   Collection Time: 03/22/23  9:51 PM  Result Value Ref Range   CRP 30.8 (H)  <1.0 mg/dL    Comment: Performed at [facility], [address], [place], [place] [address]  Sedimentation rate     Status: Abnormal   Collection Time: 03/22/23  9:51 PM  Result Value Ref Range   Sed Rate >140 (H) 0 - 16  mm/hr    Comment: Performed at [facility], [address] [place], [place] [address]  Urinalysis, Routine w reflex microscopic -Urine, Clean Catch     Status: Abnormal   Collection Time: 03/22/23 10:49 PM  Result Value Ref Range   Color, Urine YELLOW YELLOW   APPearance HAZY (A) CLEAR   Specific Gravity, Urine 1.017 1.005 - 1.030   pH 5.0 5.0 - 8.0   Glucose, UA >=500 (A) NEGATIVE mg/dL   Hgb urine dipstick SMALL (A) NEGATIVE   Bilirubin Urine NEGATIVE NEGATIVE   Ketones, ur 20 (A) NEGATIVE mg/dL   Protein, ur 604 (A) NEGATIVE mg/dL   Nitrite NEGATIVE NEGATIVE   Leukocytes,Ua MODERATE (A) NEGATIVE   RBC / HPF 0-5 0 - 5 RBC/hpf   WBC, UA >50 0 - 5 WBC/hpf   Bacteria, UA MANY (A) NONE SEEN   Squamous Epithelial / HPF 0-5 0 - 5 /HPF   WBC Clumps PRESENT    Mucus PRESENT    Hyaline Casts, UA PRESENT    Granular Casts, UA PRESENT     Comment: Performed at [facility], [address] [place], [place] [address]  CBG monitoring, ED     Status: Abnormal   Collection Time: 03/22/23 11:57 PM  Result Value Ref Range   Glucose-Capillary 416 (H) 70 - 99 mg/dL    Comment: Glucose reference range applies only to samples taken after fasting for at least 8 hours.   DG Foot Complete Right  Result Date: 03/22/2023 CLINICAL DATA:  Possible diabetic infection. EXAM: RIGHT FOOT COMPLETE - 3+ VIEW COMPARISON:  None Available. FINDINGS: Erosive changes of the distal phalanges of the first and second digits. In the absence of history of amputation findings suspicious for osteomyelitis. No acute fracture. There is no dislocation. The bones are osteopenic. Diffuse skin thickening and soft tissue edema. Dressing noted over the forefoot. IMPRESSION: Erosive changes of  the distal phalanges of the first and second digits suspicious for osteomyelitis. Electronically Signed   By: Elgie Collard M.D.   On: 03/22/2023 20:11    Pending Labs Unresulted Labs (From admission, onward)     Start     Ordered   03/23/23 0500  Basic metabolic panel  Tomorrow morning,   R        03/23/23 0001   03/23/23 0500  Magnesium  Tomorrow morning,   R        03/23/23 0001   03/23/23 0500  CBC with Differential/Platelet  Tomorrow morning,   R        03/23/23 0001   03/23/23 0030  Creatinine, serum  Once,   R        03/23/23 0030   03/23/23 0000  CBC  (heparin)  Once,   R       Comments: Baseline for heparin therapy IF NOT ALREADY DRAWN.  Notify MD if PLT < 100 K.    03/23/23 0000   03/22/23 2359  Lactic acid, plasma  (Lactic Acid)  STAT Now then every 3 hours,   R (with STAT occurrences)      03/22/23 2358   03/22/23 2302  Hemoglobin A1c  Once,   URGENT       Comments: To assess prior glycemic control    03/22/23 2301   03/22/23 1947  Blood culture (routine x 2)  BLOOD CULTURE X 2,   R (with STAT occurrences)      03/22/23 1946  Vitals/Pain Today's Vitals   03/22/23 1928 03/22/23 1932  BP: 134/76   Pulse: (!) 110   Temp: 99.9 F (37.7 C)   SpO2: 100%   Weight:  135.9 kg  Height:  5\' 11"  (1.803 m)  PainSc:  8     Isolation Precautions No active isolations  Medications Medications  vancomycin (VANCOREADY) IVPB 1750 mg/350 mL (1,750 mg Intravenous New Bag/Given 03/22/23 2253)  insulin aspart (novoLOG) injection 0-15 Units (has no administration in time range)  morphine (PF) 2 MG/ML injection 2 mg (has no administration in time range)  vancomycin (VANCOREADY) IVPB 1250 mg/250 mL (has no administration in time range)  ceFEPIme (MAXIPIME) 2 g in sodium chloride 0.9 % 100 mL IVPB (has no administration in time range)  metoCLOPramide (REGLAN) injection 10 mg (has no administration in time range)  pantoprazole (PROTONIX) injection 40 mg (has no  administration in time range)  pantoprazole (PROTONIX) injection 40 mg (has no administration in time range)  promethazine (PHENERGAN) 12.5 mg in sodium chloride 0.9 % 50 mL IVPB (has no administration in time range)  ondansetron (ZOFRAN) injection 4 mg (has no administration in time range)  buPROPion (WELLBUTRIN XL) 24 hr tablet 150 mg (has no administration in time range)  gabapentin (NEURONTIN) capsule 300 mg (has no administration in time range)  Semaglutide(0.25 or 0.5MG /DOS) SOPN 0.5 mg (has no administration in time range)  0.9 %  sodium chloride infusion (has no administration in time range)  heparin injection 5,000 Units (has no administration in time range)  acetaminophen (TYLENOL) tablet 650 mg (has no administration in time range)    Or  acetaminophen (TYLENOL) suppository 650 mg (has no administration in time range)  senna-docusate (Senokot-S) tablet 1 tablet (has no administration in time range)  insulin glargine-yfgn (SEMGLEE) injection 30 Units (has no administration in time range)  ondansetron (ZOFRAN) injection 4 mg (4 mg Intravenous Given 03/22/23 2118)  ceFEPIme (MAXIPIME) 2 g in sodium chloride 0.9 % 100 mL IVPB (0 g Intravenous Stopped 03/22/23 2234)  fentaNYL (SUBLIMAZE) injection 50 mcg (50 mcg Intravenous Given 03/22/23 2247)  oxyCODONE-acetaminophen (PERCOCET/ROXICET) 5-325 MG per tablet 1 tablet (1 tablet Oral Given 03/22/23 2247)  acetaminophen (TYLENOL) tablet 650 mg (650 mg Oral Given 03/22/23 2319)  lactated ringers bolus 1,000 mL (1,000 mLs Intravenous New Bag/Given 03/22/23 2319)    Mobility Walks at baseline.        R Recommendations: See Admitting Provider Note  Report given to:   Additional Notes:  A&Ox4, continent x2.

## 2023-03-23 NOTE — Progress Notes (Addendum)
PROGRESS NOTE    Taylor Hughes  JXB:[id number] DOB: [birth year] DOA: 03/22/2023 PCP: Hoy Register, MD  Chief Complaint  Patient presents with   Wound Infection   Hyperglycemia    Brief Narrative:   52 year old male with past medical history of T2DM, peripheral neuropathy, atrial fibrillation, HTN, and GERD.  He presents with infection of his right lower extremity .  Admitted with diabetic foot infection/osteo.   Assessment & Plan:   Principal Problem:   Diabetic foot infection (HCC) Active Problems:   Essential hypertension   Type 2 diabetes mellitus with other specified complication (HCC)   PAF (paroxysmal atrial fibrillation) (HCC)   Osteomyelitis (HCC)   HLD (hyperlipidemia)   Gastroparesis  Sepsis due to Diabetic Foot Infection  Osteomyelitis Meets criteria with leukocytosis, fever Plain films with erosive changes of distal phalanges of first and second digits Orthopedics consulted, appreciate recs ABI's ordered Vanc, cefepime, flagyl  Tachycardia Regular, will check EKG and follow on telemetry Bolus IVF and follow  With significant RLE edema, need to r/o PE, follow LE [place] and VQ scan due to his AKI  Acute Kidney Injury  Anion Gap Metabolic Acidosis  Acute Tubular Necrosis  Continue IVF with bicarb (will bolus with above) Negative lactic, UA with 20 mg/dl ketones - suspect AGMA related to AKI UA with granular cast, consistent with ATN - 100 mg/dl protein, 0-5 RBC's Follow renal US  T2DM Home regimen 30 units glargine with SSI Continue 30 units basal with SSI here, adjust prn   Hypertension Holding lasix and diovan  Dyslipidemia Holding statin  Nausea  Vomiting Gastroparesis Continue to monitor     DVT prophylaxis: heparin Code Status: full Family Communication: none Disposition:   Status is: Inpatient Remains inpatient appropriate because: continued inpatient care   Consultants:  ortho  Procedures:  none  Antimicrobials:   Anti-infectives (From admission, onward)    Start     Dose/Rate Route Frequency Ordered Stop   03/23/23 1000  vancomycin (VANCOREADY) IVPB 1250 mg/250 mL        1,250 mg 166.7 mL/hr over 90 Minutes Intravenous Every 12 hours 03/22/23 2308     03/23/23 0600  ceFEPIme (MAXIPIME) 2 g in sodium chloride 0.9 % 100 mL IVPB        2 g 200 mL/hr over 30 Minutes Intravenous Every 8 hours 03/22/23 2308     03/22/23 2200  vancomycin (VANCOREADY) IVPB 1750 mg/350 mL        1,750 mg 175 mL/hr over 120 Minutes Intravenous  Once 03/22/23 2146 03/23/23 0203   03/22/23 2200  ceFEPIme (MAXIPIME) 2 g in sodium chloride 0.9 % 100 mL IVPB        2 g 200 mL/hr over 30 Minutes Intravenous  Once 03/22/23 2146 03/22/23 2234       Subjective: No complaints, does note SOB  Objective: Vitals:   03/23/23 0153 03/23/23 0242 03/23/23 0528 03/23/23 0828  BP: (!) 122/58  134/70 121/63  Pulse: 96  96 (!) 128  Resp: 18  18 18   Temp: 98.6 F (37 C)  98.6 F (37 C) 98.4 F (36.9 C)  TempSrc:   Oral   SpO2: 99%  99% 100%  Weight:  136 kg    Height:  5\' 11"  (1.803 m)      Intake/Output Summary (Last 24 hours) at 03/23/2023 0903 Last data filed at 03/23/2023 0650 Gross per 24 hour  Intake 376.61 ml  Output --  Net 376.61 ml   [employer]  03/22/23 1932 03/23/23 0242  Weight: 135.9 kg 136 kg    Examination:  General exam: Appears calm and comfortable  Respiratory system: unlabored Cardiovascular system: regular tachycardia Central nervous system: Alert and oriented. No focal neurological deficits. Extremities: significant right lower extremity edema, dressing     Data Reviewed: I have personally reviewed following labs and imaging studies  CBC: Recent Labs  Lab 03/22/23 2023 03/23/23 0005 03/23/23 0227  WBC 17.9* 19.8* 19.1*  NEUTROABS 15.8*  --  17.0*  HGB 9.3* 8.6* 8.2*  HCT 29.9* 27.3* 25.8*  MCV 87.9 86.7 85.4  PLT 539* 497* 469*    Basic Metabolic Panel: Recent Labs  Lab  03/22/23 2023 03/23/23 0227  NA 130* 132*  K 5.0 4.3  CL 99 102  CO2 11* 12*  GLUCOSE 405* 410*  BUN 26* 28*  CREATININE 1.52* 1.70*  CALCIUM 8.7* 8.7*  MG  --  1.9  PHOS  --  2.8    GFR: Estimated Creatinine Clearance: 72.4 mL/min (Kalyb Pemble) (by C-G formula based on SCr of 1.7 mg/dL (H)).  Liver Function Tests: Recent Labs  Lab 03/22/23 2023 03/23/23 0227  AST 14* 11*  ALT 15 13  ALKPHOS 64 56  BILITOT 1.2 1.1  PROT 8.1 7.1  ALBUMIN 2.1* 1.8*    CBG: Recent Labs  Lab 03/22/23 2357 03/23/23 0418 03/23/23 0746  GLUCAP 416* 302* 208*     Recent Results (from the past 240 hour(s))  Blood culture (routine x 2)     Status: None (Preliminary result)   Collection Time: 03/22/23  8:23 PM   Specimen: BLOOD RIGHT HAND  Result Value Ref Range Status   Specimen Description BLOOD RIGHT HAND  Final   Special Requests   Final    BOTTLES DRAWN AEROBIC AND ANAEROBIC Blood Culture adequate volume   Culture   Final    NO GROWTH < 12 HOURS Performed at [facility], [address] [place], [place] [address]    Report Status PENDING  Incomplete  Blood culture (routine x 2)     Status: None (Preliminary result)   Collection Time: 03/22/23  9:09 PM   Specimen: BLOOD  Result Value Ref Range Status   Specimen Description BLOOD SITE NOT SPECIFIED  Final   Special Requests   Final    BOTTLES DRAWN AEROBIC ONLY Blood Culture adequate volume   Culture   Final    NO GROWTH < 12 HOURS Performed at [facility], [address], [place], [place] [address]    Report Status PENDING  Incomplete         Radiology Studies: DG Foot Complete Right  Result Date: 03/22/2023 CLINICAL DATA:  Possible diabetic infection. EXAM: RIGHT FOOT COMPLETE - 3+ VIEW COMPARISON:  None Available. FINDINGS: Erosive changes of the distal phalanges of the first and second digits. In the absence of history of amputation findings suspicious for osteomyelitis. No acute fracture. There is no  dislocation. The bones are osteopenic. Diffuse skin thickening and soft tissue edema. Dressing noted over the forefoot. IMPRESSION: Erosive changes of the distal phalanges of the first and second digits suspicious for osteomyelitis. Electronically Signed   By: Elgie Collard M.D.   On: 03/22/2023 20:11        Scheduled Meds:  buPROPion  150 mg Oral Daily   gabapentin  300 mg Oral BID   heparin  5,000 Units Subcutaneous Q8H   insulin aspart  0-15 Units Subcutaneous Q4H   insulin glargine-yfgn  30 Units Subcutaneous QHS   metoCLOPramide (REGLAN) injection  10 mg Intravenous Q6H   ondansetron (ZOFRAN) IV  4 mg Intravenous Q6H   pantoprazole (PROTONIX) IV  40 mg Intravenous Q24H   Semaglutide(0.25 or 0.5MG /DOS)  0.5 mg Subcutaneous Weekly   Continuous Infusions:  ceFEPime (MAXIPIME) IV 2 g (03/23/23 0534)   promethazine (PHENERGAN) injection (IM or IVPB)     sodium bicarbonate 75 mEq in sodium chloride 0.45 % 1,075 mL infusion 125 mL/hr at 03/23/23 0414   vancomycin       LOS: 0 days    Time spent: 35 min critical care time with undifferentiated tachycardia, concern for sepsis vs vte in setting of RLE swelling    Lacretia Nicks, MD [facility]   To contact the attending provider between 7A-7P or the covering provider during after hours 7P-7A, please log into the web site [email or web address] and access using universal [place] password for that web site. If you do not have the password, please call the hospital operator.  03/23/2023, 9:03 AM

## 2023-03-24 ENCOUNTER — Inpatient Hospital Stay (HOSPITAL_COMMUNITY): Payer: PRIVATE HEALTH INSURANCE

## 2023-03-24 DIAGNOSIS — I890 Lymphedema, not elsewhere classified: Secondary | ICD-10-CM | POA: Diagnosis not present

## 2023-03-24 DIAGNOSIS — R008 Other abnormalities of heart beat: Secondary | ICD-10-CM

## 2023-03-24 DIAGNOSIS — R000 Tachycardia, unspecified: Secondary | ICD-10-CM

## 2023-03-24 DIAGNOSIS — I517 Cardiomegaly: Secondary | ICD-10-CM

## 2023-03-24 DIAGNOSIS — E43 Unspecified severe protein-calorie malnutrition: Secondary | ICD-10-CM | POA: Diagnosis not present

## 2023-03-24 DIAGNOSIS — L02611 Cutaneous abscess of right foot: Secondary | ICD-10-CM

## 2023-03-24 DIAGNOSIS — L089 Local infection of the skin and subcutaneous tissue, unspecified: Secondary | ICD-10-CM | POA: Diagnosis not present

## 2023-03-24 DIAGNOSIS — E11628 Type 2 diabetes mellitus with other skin complications: Secondary | ICD-10-CM | POA: Diagnosis not present

## 2023-03-24 DIAGNOSIS — M869 Osteomyelitis, unspecified: Secondary | ICD-10-CM | POA: Diagnosis not present

## 2023-03-24 LAB — SURGICAL PCR SCREEN
MRSA, PCR: NEGATIVE
Staphylococcus aureus: POSITIVE — AB

## 2023-03-24 LAB — GLUCOSE, CAPILLARY
Glucose-Capillary: 118 mg/dL — ABNORMAL HIGH (ref 70–99)
Glucose-Capillary: 142 mg/dL — ABNORMAL HIGH (ref 70–99)
Glucose-Capillary: 155 mg/dL — ABNORMAL HIGH (ref 70–99)
Glucose-Capillary: 165 mg/dL — ABNORMAL HIGH (ref 70–99)
Glucose-Capillary: 178 mg/dL — ABNORMAL HIGH (ref 70–99)
Glucose-Capillary: 262 mg/dL — ABNORMAL HIGH (ref 70–99)

## 2023-03-24 MED ORDER — PERFLUTREN LIPID MICROSPHERE
1.0000 mL | INTRAVENOUS | Status: AC | PRN
  Administered 2023-03-24: 2 mL via INTRAVENOUS

## 2023-03-24 MED ORDER — LACTATED RINGERS IV BOLUS
1000.0000 mL | Freq: Once | INTRAVENOUS | Status: AC
  Administered 2023-03-24: 1000 mL via INTRAVENOUS

## 2023-03-24 MED ORDER — METOPROLOL TARTRATE 25 MG PO TABS
25.0000 mg | ORAL_TABLET | Freq: Two times a day (BID) | ORAL | Status: DC
  Administered 2023-03-24 (×2): 25 mg via ORAL
  Filled 2023-03-24 (×2): qty 1

## 2023-03-24 MED ORDER — MAGNESIUM SULFATE 2 GM/50ML IV SOLN
2.0000 g | Freq: Once | INTRAVENOUS | Status: AC
  Administered 2023-03-24: 2 g via INTRAVENOUS
  Filled 2023-03-24: qty 50

## 2023-03-24 MED ORDER — MUPIROCIN 2 % EX OINT
1.0000 | TOPICAL_OINTMENT | Freq: Two times a day (BID) | CUTANEOUS | Status: AC
  Administered 2023-03-24 – 2023-03-29 (×10): 1 via NASAL
  Filled 2023-03-24 (×4): qty 22

## 2023-03-24 MED ORDER — METOPROLOL TARTRATE 12.5 MG HALF TABLET
12.5000 mg | ORAL_TABLET | Freq: Two times a day (BID) | ORAL | Status: DC
  Filled 2023-03-24: qty 1

## 2023-03-24 MED ORDER — SIMETHICONE 80 MG PO CHEW
80.0000 mg | CHEWABLE_TABLET | Freq: Four times a day (QID) | ORAL | Status: DC | PRN
  Administered 2023-03-24: 80 mg via ORAL
  Filled 2023-03-24: qty 1

## 2023-03-24 MED ORDER — METOPROLOL TARTRATE 25 MG PO TABS: 25.0000 mg | ORAL_TABLET | Freq: Two times a day (BID) | ORAL | Status: DC

## 2023-03-24 MED ORDER — LACTATED RINGERS IV SOLN: INTRAVENOUS | Status: DC

## 2023-03-24 MED ORDER — METOPROLOL TARTRATE 5 MG/5ML IV SOLN
5.0000 mg | INTRAVENOUS | Status: DC | PRN
  Administered 2023-03-24: 5 mg via INTRAVENOUS
  Filled 2023-03-24: qty 5

## 2023-03-24 MED ORDER — LACTATED RINGERS IV BOLUS
500.0000 mL | Freq: Once | INTRAVENOUS | Status: AC
  Administered 2023-03-24: 500 mL via INTRAVENOUS

## 2023-03-24 MED ORDER — METOPROLOL TARTRATE 5 MG/5ML IV SOLN
5.0000 mg | INTRAVENOUS | Status: AC | PRN
  Administered 2023-03-24 (×3): 5 mg via INTRAVENOUS
  Filled 2023-03-24 (×3): qty 5

## 2023-03-24 MED ORDER — IOHEXOL 350 MG/ML SOLN
75.0000 mL | Freq: Once | INTRAVENOUS | Status: AC | PRN
  Administered 2023-03-24: 75 mL via INTRAVENOUS

## 2023-03-24 NOTE — Progress Notes (Addendum)
PROGRESS NOTE    Taylor Hughes  NWG:[id number] DOB: [birth year] DOA: 03/22/2023 PCP: Hoy Register, MD  Chief Complaint  Patient presents with   Wound Infection   Hyperglycemia    Brief Narrative:   52 year old male with past medical history of T2DM, peripheral neuropathy, atrial fibrillation, HTN, and GERD.  He presents with infection of his right lower extremity .  Admitted with diabetic foot infection/osteo.   Assessment & Plan:   Principal Problem:   Diabetic foot infection (HCC) Active Problems:   Essential hypertension   Type 2 diabetes mellitus with other specified complication (HCC)   PAF (paroxysmal atrial fibrillation) (HCC)   Osteomyelitis (HCC)   HLD (hyperlipidemia)   Gastroparesis   Severe protein-calorie malnutrition (HCC)   Lymphedema   Abscess of right foot  Sepsis due to Diabetic Foot Infection  Osteomyelitis Meets criteria with leukocytosis, fever Plain films with erosive changes of distal phalanges of first and second digits Orthopedics consulted, appreciate recs ABI's with mild right lower extremity arterial disease Vanc, cefepime, flagyl Ortho planning transmetatarsal amputation on R Wednesday   SVT Intermittent sinus tachy, recent EKG with SVT -> will c/s cardiology, transfer to progressive Will treat suspected underlying cause (sepsis -- follow with bolus IVF) With significant RLE edema, need to r/o PE, follow LE [place] and VQ scan due to his AKI -> both negative for DVT and PE Echo with EF 70-75%, no RWMA, severely enlarged RV Will add low dose beta blocker as he's starting to sustain in the 130's  Acute Kidney Injury  Anion Gap Metabolic Acidosis  Acute Tubular Necrosis  Continue IVF  Negative lactic, UA with 20 mg/dl ketones - suspect AGMA related to AKI UA with granular cast, consistent with ATN - 100 mg/dl protein, 0-5 RBC's Follow renal US (no hydro)  Right Lower Extremity Lymphedema Noted, will need outpatient follow up  T2DM Home  regimen 30 units glargine with SSI Continue 30 units basal with SSI here, adjust prn   Hypertension Holding lasix and diovan  Dyslipidemia Holding statin  Nausea  Vomiting Gastroparesis Continue to monitor  Obesity Body mass index is 41.82 kg/m.    DVT prophylaxis: heparin Code Status: full Family Communication: none Disposition:   Status is: Inpatient Remains inpatient appropriate because: continued inpatient care   Consultants:  ortho  Procedures:  none  Antimicrobials:  Anti-infectives (From admission, onward)    Start     Dose/Rate Route Frequency Ordered Stop   03/23/23 1000  vancomycin (VANCOREADY) IVPB 1250 mg/250 mL        1,250 mg 166.7 mL/hr over 90 Minutes Intravenous Every 12 hours 03/22/23 2308     03/23/23 1000  metroNIDAZOLE (FLAGYL) tablet 500 mg        500 mg Oral Every 12 hours 03/23/23 0906     03/23/23 0600  ceFEPIme (MAXIPIME) 2 g in sodium chloride 0.9 % 100 mL IVPB        2 g 200 mL/hr over 30 Minutes Intravenous Every 8 hours 03/22/23 2308     03/22/23 2200  vancomycin (VANCOREADY) IVPB 1750 mg/350 mL        1,750 mg 175 mL/hr over 120 Minutes Intravenous  Once 03/22/23 2146 03/23/23 0203   03/22/23 2200  ceFEPIme (MAXIPIME) 2 g in sodium chloride 0.9 % 100 mL IVPB        2 g 200 mL/hr over 30 Minutes Intravenous  Once 03/22/23 2146 03/22/23 2234       Subjective: No complaints  Objective: Vitals:  03/24/23 0017 03/24/23 0430 03/24/23 0844 03/24/23 1124  BP: 111/60 (!) 114/53 125/63 (!) 109/56  Pulse: (!) 129 (!) 128 (!) 121 (!) 130  Resp: 20  19   Temp: 99.1 F (37.3 C) 99.5 F (37.5 C) 99.8 F (37.7 C) 99.3 F (37.4 C)  TempSrc: Oral  Oral Oral  SpO2: 97% 98% 96% 96%  Weight:      Height:        Intake/Output Summary (Last 24 hours) at 03/24/2023 1502 Last data filed at 03/24/2023 1300 Gross per 24 hour  Intake 2273.43 ml  Output 425 ml  Net 1848.43 ml   Filed Weights   03/22/23 1932 03/23/23 0242  Weight:  135.9 kg 136 kg    Examination:  General: No acute distress. Cardiovascular: sinus tachycardia Lungs: unlabored Abdomen: Soft, nontender, nondistended. Neurological: Alert and oriented 3. Moves all extremities 4 with equal strength. Cranial nerves II through XII grossly intact. Extremities: significant RLE swelling, dressing  Data Reviewed: I have personally reviewed following labs and imaging studies  CBC: Recent Labs  Lab 03/22/23 2023 03/23/23 0005 03/23/23 0227 03/24/23 0316  WBC 17.9* 19.8* 19.1* 17.1*  NEUTROABS 15.8*  --  17.0* 14.3*  HGB 9.3* 8.6* 8.2* 8.5*  HCT 29.9* 27.3* 25.8* 26.0*  MCV 87.9 86.7 85.4 84.1  PLT 539* 497* 469* 465*    Basic Metabolic Panel: Recent Labs  Lab 03/22/23 2023 03/23/23 0227 03/24/23 0316  NA 130* 132* 137  K 5.0 4.3 3.6  CL 99 102 106  CO2 11* 12* 22  GLUCOSE 405* 410* 126*  BUN 26* 28* 21*  CREATININE 1.52* 1.70* 1.41*  CALCIUM 8.7* 8.7* 8.2*  MG  --  1.9 1.7  PHOS  --  2.8 1.9*    GFR: Estimated Creatinine Clearance: 87.3 mL/min (Dashonna Chagnon) (by C-G formula based on SCr of 1.41 mg/dL (H)).  Liver Function Tests: Recent Labs  Lab 03/22/23 2023 03/23/23 0227 03/24/23 0316  AST 14* 11* 14*  ALT 15 13 14   ALKPHOS 64 56 50  BILITOT 1.2 1.1 0.4  PROT 8.1 7.1 6.7  ALBUMIN 2.1* 1.8* 1.6*    CBG: Recent Labs  Lab 03/23/23 2019 03/24/23 0015 03/24/23 0432 03/24/23 0751 03/24/23 1150  GLUCAP 176* 165* 118* 155* 142*     Recent Results (from the past 240 hour(s))  Blood culture (routine x 2)     Status: None (Preliminary result)   Collection Time: 03/22/23  8:23 PM   Specimen: BLOOD RIGHT HAND  Result Value Ref Range Status   Specimen Description BLOOD RIGHT HAND  Final   Special Requests   Final    BOTTLES DRAWN AEROBIC AND ANAEROBIC Blood Culture adequate volume   Culture   Final    NO GROWTH 2 DAYS Performed at [facility], [address] [place], [place] [address]    Report Status PENDING   Incomplete  Blood culture (routine x 2)     Status: None (Preliminary result)   Collection Time: 03/22/23  9:09 PM   Specimen: BLOOD  Result Value Ref Range Status   Specimen Description BLOOD SITE NOT SPECIFIED  Final   Special Requests   Final    BOTTLES DRAWN AEROBIC ONLY Blood Culture adequate volume   Culture   Final    NO GROWTH 2 DAYS Performed at [facility], [address], [place], [place] [address]    Report Status PENDING  Incomplete  SARS Coronavirus 2 by RT PCR (hospital order,  performed in [facility] hospital lab) *cepheid single result test* Anterior Nasal Swab     Status: None   Collection Time: 03/23/23  5:20 PM   Specimen: Anterior Nasal Swab  Result Value Ref Range Status   SARS Coronavirus 2 by RT PCR NEGATIVE NEGATIVE Final    Comment: Performed at [facility], [address] [place], [place] [address]         Radiology Studies: ECHOCARDIOGRAM COMPLETE  Result Date: 03/24/2023    ECHOCARDIOGRAM REPORT   Patient Name:   Taylor Hughes Date of Exam: 03/24/2023 Medical Rec #:  [id number]    Height:       71.0 in Accession #:    [id number]   Weight:       299.8 lb Date of Birth:  08/01/1971     BSA:          2.504 m Patient Age:    52 years     BP:           114/53 mmHg Patient Gender: M            HR:           129 bpm. Exam Location:  Inpatient Procedure: 2D Echo, Cardiac Doppler, Color Doppler and Intracardiac            Opacification Agent Indications:    R00.8 Other abnormalities of heart beat  History:        Patient has prior history of Echocardiogram examinations, most                 recent 08/15/2022. Arrythmias:Atrial Fibrillation; Risk                 Factors:Diabetes, Dyslipidemia and Hypertension.  Sonographer:    Milbert Coulter Referring Phys: [phone] Neysha Criado CALDWELL POWELL JR  Sonographer Comments: Patient is obese. Image acquisition challenging due to patient body habitus. IMPRESSIONS  1. Left ventricular ejection fraction, by estimation, is 70 to 75%.  The left ventricle has hyperdynamic function. The left ventricle has no regional wall motion abnormalities. Left ventricular diastolic parameters are indeterminate.  2. Right ventricular systolic function is hyperdynamic. The right ventricular size is severely enlarged.  3. The mitral valve was not well visualized. No evidence of mitral valve regurgitation. No evidence of mitral stenosis.  4. The aortic valve was not well visualized. Aortic valve regurgitation is not visualized.  5. The inferior vena cava is normal in size with greater than 50% respiratory variability, suggesting right atrial pressure of 3 mmHg. Comparison(s): Prior images reviewed side by side. Function is now hyperdynamic. Heart rate notably faster. FINDINGS  Left Ventricle: Left ventricular ejection fraction, by estimation, is 70 to 75%. The left ventricle has hyperdynamic function. The left ventricle has no regional wall motion abnormalities. Definity contrast agent was given IV to delineate the left ventricular endocardial borders. The left ventricular internal cavity size was normal in size. Suboptimal image quality limits for assessment of left ventricular hypertrophy. Left ventricular diastolic parameters are indeterminate. Right Ventricle: The right ventricular size is severely enlarged. Right vetricular wall thickness was not well visualized. Right ventricular systolic function is hyperdynamic. Left Atrium: Left atrial size was not well visualized. Right Atrium: Right atrial size was not well visualized. Pericardium: There is no evidence of pericardial effusion. Mitral Valve: The mitral valve was not well visualized. No evidence of mitral valve regurgitation. No evidence of mitral valve stenosis. Tricuspid Valve: The tricuspid valve is normal in structure. Tricuspid valve  regurgitation is not demonstrated. No evidence of tricuspid stenosis. Aortic Valve: The aortic valve was not well visualized. Aortic valve regurgitation is not  visualized. Aortic valve mean gradient measures 5.0 mmHg. Aortic valve peak gradient measures 8.3 mmHg. Aortic valve area, by VTI measures 2.61 cm. Pulmonic Valve: The pulmonic valve was not well visualized. Pulmonic valve regurgitation is not visualized. No evidence of pulmonic stenosis. Aorta: The aortic root is normal in size and structure. Venous: The inferior vena cava is normal in size with greater than 50% respiratory variability, suggesting right atrial pressure of 3 mmHg. IAS/Shunts: The interatrial septum was not well visualized.  LEFT VENTRICLE PLAX 2D LVIDd:         4.00 cm LVIDs:         2.50 cm LV PW:         1.10 cm LV IVS:        1.20 cm LVOT diam:     1.90 cm LV SV:         48 LV SV Index:   19 LVOT Area:     2.84 cm  RIGHT VENTRICLE RV S prime:     14.40 cm/s TAPSE (M-mode): 2.1 cm LEFT ATRIUM             Index        RIGHT ATRIUM           Index LA diam:        3.50 cm 1.40 cm/m   RA Area:     17.50 cm LA Vol (A2C):   35.7 ml 14.25 ml/m  RA Volume:   47.80 ml  19.09 ml/m LA Vol (A4C):   37.9 ml 15.13 ml/m LA Biplane Vol: 39.7 ml 15.85 ml/m  AORTIC VALVE AV Area (Vmax):    2.64 cm AV Area (Vmean):   2.46 cm AV Area (VTI):     2.61 cm AV Vmax:           144.00 cm/s AV Vmean:          97.900 cm/s AV VTI:            0.185 m AV Peak Grad:      8.3 mmHg AV Mean Grad:      5.0 mmHg LVOT Vmax:         134.00 cm/s LVOT Vmean:        84.900 cm/s LVOT VTI:          0.170 m LVOT/AV VTI ratio: 0.92  SHUNTS Systemic VTI:  0.17 m Systemic Diam: 1.90 cm Riley Lam MD Electronically signed by Riley Lam MD Signature Date/Time: 03/24/2023/11:49:21 AM    Final    VAS [place] ABI WITH/WO TBI  Result Date: 03/23/2023  LOWER EXTREMITY DOPPLER STUDY Patient Name:  AHMAN PIRL  Date of Exam:   03/23/2023 Medical Rec #: [id number]     Accession #:    [id number] Date of Birth: 1970/10/06      Patient Gender: M Patient Age:   30 years Exam Location:  [facility] Procedure:      VAS [place] ABI  WITH/WO TBI Referring Phys: Shylah Dossantos POWELL JR --------------------------------------------------------------------------------  Indications: Ulceration, and peripheral artery disease. High Risk Factors: Diabetes.  Comparison Study: No prior study Performing Technologist: Shona Simpson  Examination Guidelines: Carolyn Sylvia complete evaluation includes at minimum, Doppler waveform signals and systolic blood pressure reading at the level of bilateral brachial, anterior tibial, and posterior tibial arteries, when vessel segments are accessible. Bilateral testing is considered  an integral part of Aarianna Hoadley complete examination. Photoelectric Plethysmograph (PPG) waveforms and toe systolic pressure readings are included as required and additional duplex testing as needed. Limited examinations for reoccurring indications may be performed as noted.  ABI Findings: +--------+------------------+-----+---------+--------+ Right   Rt Pressure (mmHg)IndexWaveform Comment  +--------+------------------+-----+---------+--------+ [id number]                    triphasic         +--------+------------------+-----+---------+--------+ PTA     98                0.68 biphasic          +--------+------------------+-----+---------+--------+ DP      131               0.91 biphasic          +--------+------------------+-----+---------+--------+ +--------+------------------+-----+---------+-------+ Left    Lt Pressure (mmHg)IndexWaveform Comment +--------+------------------+-----+---------+-------+ [id number]                    triphasic        +--------+------------------+-----+---------+-------+ PTA     157               1.09 biphasic         +--------+------------------+-----+---------+-------+ DP      162               1.12 biphasic         +--------+------------------+-----+---------+-------+ TOES Findings:  Unable to insonate due to open wounds +---------+---------------+--------+-------+ Left ToesPressure  (mmHg)WaveformComment +---------+---------------+--------+-------+ 3rd Digit               Normal          +---------+---------------+--------+-------+ Unable to assess other digits or test compression due to size/shape incompatibility with sensor   Summary: Right: Resting right ankle-brachial index indicates mild right lower extremity arterial disease. Left: Resting left ankle-brachial index is within normal range. *See table(s) above for measurements and observations.  Electronically signed by Lemar Livings MD on 03/23/2023 at 6:58:35 PM.    Final    VAS [place] LOWER EXTREMITY VENOUS (DVT)  Result Date: 03/23/2023  Lower Venous DVT Study Patient Name:  TEAGUN TIVNAN  Date of Exam:   03/23/2023 Medical Rec #: [id number]     Accession #:    [id number] Date of Birth: 1971-08-02      Patient Gender: M Patient Age:   49 years Exam Location:  [facility] Procedure:      VAS [place] LOWER EXTREMITY VENOUS (DVT) Referring Phys: Leotha Westermeyer POWELL JR --------------------------------------------------------------------------------  Indications: Edema, and ulceration.  Risk Factors: Limited mobility. Limitations: Body habitus. Comparison Study: No significant changes seen since prior exam 10/03/21. Performing Technologist: Shona Simpson  Examination Guidelines: Arlander Gillen complete evaluation includes B-mode imaging, spectral Doppler, color Doppler, and power Doppler as needed of all accessible portions of each vessel. Bilateral testing is considered an integral part of Asjah Rauda complete examination. Limited examinations for reoccurring indications may be performed as noted. The reflux portion of the exam is performed with the patient in reverse Trendelenburg.  +---------+---------------+---------+-----------+---------------+--------------+ RIGHT    CompressibilityPhasicitySpontaneityProperties     Thrombus Aging +---------+---------------+---------+-----------+---------------+--------------+ CFV      Full           Yes      Yes                                       +---------+---------------+---------+-----------+---------------+--------------+  SFJ      Full                                                             +---------+---------------+---------+-----------+---------------+--------------+ FV Prox  Full                                                             +---------+---------------+---------+-----------+---------------+--------------+ FV Mid   Full                                                             +---------+---------------+---------+-----------+---------------+--------------+ FV DistalFull                                                             +---------+---------------+---------+-----------+---------------+--------------+ PFV      Full                                                             +---------+---------------+---------+-----------+---------------+--------------+ POP      Full           Yes      Yes                                      +---------+---------------+---------+-----------+---------------+--------------+ PTV      Full                                                             +---------+---------------+---------+-----------+---------------+--------------+ PERO                                        Patent by color               +---------+---------------+---------+-----------+---------------+--------------+   +----+---------------+---------+-----------+----------+--------------+ LEFTCompressibilityPhasicitySpontaneityPropertiesThrombus Aging +----+---------------+---------+-----------+----------+--------------+ CFV Full           Yes      Yes                                 +----+---------------+---------+-----------+----------+--------------+    Summary: RIGHT: - There is no evidence of deep vein thrombosis in the lower extremity.  - No cystic structure found  in the popliteal fossa.  LEFT: - No evidence of common femoral  vein obstruction.  *See table(s) above for measurements and observations. Electronically signed by Lemar Livings MD on 03/23/2023 at 6:56:49 PM.    Final    NM Pulmonary Perfusion  Result Date: 03/23/2023 CLINICAL DATA:  Tachycardia, RIGHT leg swelling EXAM: NUCLEAR MEDICINE PERFUSION LUNG SCAN TECHNIQUE: Perfusion images were obtained in multiple projections after intravenous injection of radiopharmaceutical. RADIOPHARMACEUTICALS:  4.2 mCi Tc-31m MAA COMPARISON:  03/23/2023 FINDINGS: No wedge-shaped peripheral perfusion defect noted in the LEFT or RIGHT lung to suggest acute pulmonary embolism. Cardiac attenuation noted. IMPRESSION: No evidence acute pulmonary embolism. Electronically Signed   By: Genevive Bi M.D.   On: 03/23/2023 16:13   US RENAL  Result Date: 03/23/2023 CLINICAL DATA:  Acute kidney injury EXAM: RENAL / URINARY TRACT ULTRASOUND COMPLETE COMPARISON:  None Available. FINDINGS: Right Kidney: Renal measurements: 11.8 x 5.8 x 5.2 cm = volume: 183 mL. Echogenicity within normal limits. No mass or hydronephrosis visualized. Left Kidney: Renal measurements: 12.0 x 6.7 x 6.3 cm = volume: 266 mL. Echogenicity within normal limits. No mass or hydronephrosis visualized. Bladder: Not well visualized due to underdistention. Other: None. IMPRESSION: No hydronephrosis. Electronically Signed   By: Allegra Lai M.D.   On: 03/23/2023 13:49   DG Chest Port 1 View  Result Date: 03/23/2023 CLINICAL DATA:  Shortness of breath EXAM: PORTABLE CHEST 1 VIEW COMPARISON:  08/14/2022 FINDINGS: Cardiomegaly. Diffuse bilateral interstitial pulmonary opacity. The visualized skeletal structures are unremarkable. IMPRESSION: Cardiomegaly with diffuse bilateral interstitial pulmonary opacity, consistent with edema or atypical/viral infection. No focal airspace opacity. Electronically Signed   By: Jearld Lesch M.D.   On: 03/23/2023 12:24   DG Foot Complete Right  Result Date: 03/22/2023 CLINICAL DATA:   Possible diabetic infection. EXAM: RIGHT FOOT COMPLETE - 3+ VIEW COMPARISON:  None Available. FINDINGS: Erosive changes of the distal phalanges of the first and second digits. In the absence of history of amputation findings suspicious for osteomyelitis. No acute fracture. There is no dislocation. The bones are osteopenic. Diffuse skin thickening and soft tissue edema. Dressing noted over the forefoot. IMPRESSION: Erosive changes of the distal phalanges of the first and second digits suspicious for osteomyelitis. Electronically Signed   By: Elgie Collard M.D.   On: 03/22/2023 20:11        Scheduled Meds:  buPROPion  150 mg Oral Daily   gabapentin  300 mg Oral BID   heparin injection (subcutaneous)  5,000 Units Subcutaneous Q8H   insulin aspart  0-15 Units Subcutaneous Q4H   insulin glargine-yfgn  30 Units Subcutaneous QHS   metoCLOPramide (REGLAN) injection  10 mg Intravenous Q6H   metroNIDAZOLE  500 mg Oral Q12H   pantoprazole (PROTONIX) IV  40 mg Intravenous Q24H   Continuous Infusions:  ceFEPime (MAXIPIME) IV 2 g (03/24/23 1317)   promethazine (PHENERGAN) injection (IM or IVPB)     sodium bicarbonate 150 mEq in sterile water 1,150 mL infusion 125 mL/hr at 03/24/23 4098   vancomycin 1,250 mg (03/24/23 0919)     LOS: 1 day    Time spent: 35 min critical care time with undifferentiated tachycardia, concern for sepsis vs vte in setting of RLE swelling    Lacretia Nicks, MD [facility]   To contact the attending provider between 7A-7P or the covering provider during after hours 7P-7A, please log into the web site [email or web address] and access using universal [place] password for that web site. If you do not  have the password, please call the hospital operator.  03/24/2023, 3:02 PM

## 2023-03-24 NOTE — Consult Note (Signed)
Cardiology Consultation   Patient ID: Taylor Hughes MRN: [id number]; DOB: 09-18-1970  Admit date: 03/22/2023 Date of Consult: 03/24/2023  PCP:  Hoy Register, MD   [facility] Providers Cardiologist: New to Dr. Mayford Knife  Patient Profile:   Taylor Hughes is a 52 y.o. male with a hx of type 2 diabetes, hypertension, hyperlipidemia, depression, who is being seen 03/24/2023 for the evaluation of SVT at the request of Dr. Lowell Guitar.  History of Present Illness:   [name] is above past medical history presented to the ER on 03/22/2023 complaining worsening of right lower extremity wound.  He had lost right great toenail 1 month ago.  Subsequently right leg had become painful and swollen.  He had noted increased drainage of his right foot wound and therefore came to the ER for evaluation.  X-ray of right foot had revealed erosive changes of distal phalangeal of first and second digits suspicious for osteomyelitis. He was admitted for sepsis 2/2  osteomyelitis of diabetic foot and AKI.  Orthopedic surgery was consulted, patient was planned for transmetatarsal amputation Wednesday, 03/25/2023.  He was noted with sinus tachycardia with heart rate up to 120-130s early morning today.  Cardiology consult was requested today for further evaluation.   Patient states he sometimes can feel his heart races. He does not have any chest pain, SOB, dizziness, syncope. He felt SOB 2 days ago but this has resolved. He denied any hx of cardiac disease. He denied hx of A fib. He states every time he is in the hospital his heart would race.   Per chart review, patient has no prior cardiac disease in the past.  Echocardiogram from 07/30/2018 with LVEF 65 to 70%, no regional motion abnormality, mild LVH.  Echocardiogram from today revealed LVEF 70 to 75%, no regional motion abnormality, indeterminate diastolic parameter, hyperdynamic RV systolic function, severely enlarged RV, no significant valvular disease.EKG from  03/24/2023 at 818 revealed sinus tachycardia 122 bpm.  Repeat EKG 03/24/23 at 1639 showed SVT 140s.   Diagnostic workup from today revealed AKI with creatinine 1.41, BUN 21, GFR >60.  BNP 462.  CBC with leukocytosis 17100, hemoglobin 8.5, platelet 465k. TSH WNL. Ft4 1.18.    Past Medical History:  Diagnosis Date   Atrial fibrillation (HCC)    Diabetes mellitus without complication (HCC)    Type 2   GERD (gastroesophageal reflux disease)    Wears glasses    Wound, open, foot    left diabetic     Past Surgical History:  Procedure Laterality Date   AMPUTATION Left 08/20/2018   Procedure: LEFT FOOT IRRIGATON AND DEBRIDEMENT, 5TH RAY AMPUTATION;  Surgeon: Nadara Mustard, MD;  Location: [facility] OR;  Service: Orthopedics;  Laterality: Left;   APPLICATION OF A-CELL OF CHEST/ABDOMEN N/A 05/30/2019   Procedure: Application Of A-Cell Of Chest;  Surgeon: Corliss Skains, MD;  Location: [facility] OR;  Service: Cardiothoracic;  Laterality: N/A;   APPLICATION OF WOUND VAC N/A 05/30/2019   Procedure: Application Of Wound Vac;  Surgeon: Corliss Skains, MD;  Location: [facility] OR;  Service: Cardiothoracic;  Laterality: N/A;   FINGER SURGERY Right 2016   I&D  small finger   I & D EXTREMITY Left 07/30/2018   Procedure: IRRIGATION AND DEBRIDEMENT LEFT FOOT WITH POSSIBLE AMPUTATION OF FIFTH TOE;  Surgeon: Kathryne Hitch, MD;  Location: [facility];  Service: Orthopedics;  Laterality: Left;   INCISION AND DRAINAGE ABSCESS Left 01/19/2019   Procedure: INCISION AND DRAINAGE ABSCESS upper chest;  Surgeon:  Christia Reading, MD;  Location: [facility] OR;  Service: ENT;  Laterality: Left;   IRRIGATION AND DEBRIDEMENT STERNOCLAVICULAR JOINT-STERNUM AND RIBS N/A 05/30/2019   Procedure: IRRIGATION AND DEBRIDEMENT OF STERNOCLAVICULAR JOINT-STERNUM AND RIBS ;  Surgeon: Corliss Skains, MD;  Location: [facility] OR;  Service: Cardiothoracic;  Laterality: N/A;   MINOR IRRIGATION AND DEBRIDEMENT OF WOUND N/A 11/22/2018   Procedure: INCISION  AND DRAINAGE OF NECK ABSCESS;  Surgeon: Christia Reading, MD;  Location: [facility];  Service: ENT;  Laterality: N/A;     Home Medications:  Prior to Admission medications   Medication Sig Start Date End Date Taking? Authorizing Provider  acetaminophen (TYLENOL) 325 MG tablet Take 650 mg by mouth every 6 (six) hours as needed for mild pain, fever or headache.    [provider]  albuterol (VENTOLIN HFA) 108 (90 Base) MCG/ACT inhaler Inhale 2 puffs into the lungs every 6 (six) hours as needed for wheezing or shortness of breath. 10/07/22   Hoy Register, MD  aspirin EC 81 MG tablet Take 81 mg by mouth daily. Swallow whole.    [provider]  atorvastatin (LIPITOR) 20 MG tablet Take 1 tablet (20 mg total) by mouth daily. 01/19/23   Hoy Register, MD  Blood Glucose Monitoring Suppl (TRUE METRIX METER) w/Device KIT Use to check blood sugar three times daily. 03/02/23   Hoy Register, MD  buPROPion (WELLBUTRIN XL) 150 MG 24 hr tablet Take 1 tablet (150 mg total) by mouth daily. 01/19/23   Hoy Register, MD  feeding supplement, GLUCERNA SHAKE, (GLUCERNA SHAKE) LIQD Take 237 mLs by mouth 3 (three) times daily between meals. 08/21/22   Rolly Salter, MD  furosemide (LASIX) 20 MG tablet Take 1 tablet (20 mg total) by mouth daily. 01/19/23   Hoy Register, MD  gabapentin (NEURONTIN) 300 MG capsule Take 1 capsule (300 mg total) by mouth 2 (two) times daily. 01/19/23   Hoy Register, MD  glucose blood (TRUE METRIX BLOOD GLUCOSE TEST) test strip Use as directed 02/04/23   Hoy Register, MD  HYDROcodone-acetaminophen (NORCO/VICODIN) 5-325 MG tablet Take 1 tablet every 6 hours by oral route as needed for 7 days. 10/31/22     Insulin Glargine (BASAGLAR KWIKPEN) 100 UNIT/ML Inject 30 Units into the skin daily. 01/19/23   Hoy Register, MD  insulin lispro (HUMALOG KWIKPEN) 100 UNIT/ML KwikPen Inject 0-12 units into the skin three times daily with meals. Per sliding scale. 01/19/23   Hoy Register, MD  Insulin Pen Needle 32G X 6 MM MISC USE AS DIRECTED 11/17/22   Hoy Register, MD  metoCLOPramide (REGLAN) 10 MG tablet Take 1 tablet (10 mg total) by mouth 3 (three) times daily before meals for 7 days. 08/21/22 08/28/22  Rolly Salter, MD  omeprazole (PRILOSEC) 40 MG capsule Take 1 capsule (40 mg total) by mouth daily. 01/19/23   Hoy Register, MD  Semaglutide, 1 MG/DOSE, 4 MG/3ML SOPN Inject 1 mg as directed once a week. 01/19/23   Hoy Register, MD  Semaglutide,0.25 or 0.5MG /DOS, (OZEMPIC, 0.25 OR 0.5 MG/DOSE,) 2 MG/3ML SOPN Inject 0.5 mg into the skin once a week. For 4 weeks then increase to 1mg  01/19/23   Hoy Register, MD  sildenafil (VIAGRA) 50 MG tablet Take 1 tablet (50 mg total) by mouth daily as needed for erectile dysfunction. At least 24 hours between doses 03/12/23   Hoy Register, MD  TRUEplus Lancets 28G MISC Use to check blood sugar three times daily. 03/02/23   Hoy Register, MD  valsartan-hydrochlorothiazide (  DIOVAN-HCT) 160-25 MG tablet Take 1 tablet by mouth daily. 01/19/23   Hoy Register, MD    Inpatient Medications: Scheduled Meds:  buPROPion  150 mg Oral Daily   gabapentin  300 mg Oral BID   heparin injection (subcutaneous)  5,000 Units Subcutaneous Q8H   insulin aspart  0-15 Units Subcutaneous Q4H   insulin glargine-yfgn  30 Units Subcutaneous QHS   metoCLOPramide (REGLAN) injection  10 mg Intravenous Q6H   metoprolol tartrate  12.5 mg Oral BID   metroNIDAZOLE  500 mg Oral Q12H   pantoprazole (PROTONIX) IV  40 mg Intravenous Q24H   Continuous Infusions:  ceFEPime (MAXIPIME) IV 2 g (03/24/23 1317)   lactated ringers 125 mL/hr at 03/24/23 1724   promethazine (PHENERGAN) injection (IM or IVPB)     vancomycin 1,250 mg (03/24/23 0919)   PRN Meds: acetaminophen **OR** acetaminophen, metoprolol tartrate, morphine injection, promethazine **OR** promethazine (PHENERGAN) injection (IM or IVPB) **OR** promethazine, senna-docusate  Allergies:   No Known  Allergies  Social History:   Social History   Socioeconomic History   Marital status: Divorced    Spouse name: Not on file   Number of children: Not on file   Years of education: Not on file   Highest education level: Not on file  Occupational History   Not on file  Tobacco Use   Smoking status: Never   Smokeless tobacco: Never  Vaping Use   Vaping status: Never Used  Substance and Sexual Activity   Alcohol use: No    Alcohol/week: 0.0 standard drinks of alcohol   Drug use: No   Sexual activity: Not on file  Other Topics Concern   Not on file  Social History Narrative   Not on file   Social Determinants of Health   Financial Resource Strain: Not on file  Food Insecurity: No Food Insecurity (03/23/2023)   Hunger Vital Sign    Worried About Running Out of Food in the Last Year: Never true    Ran Out of Food in the Last Year: Never true  Transportation Needs: No Transportation Needs (03/23/2023)   PRAPARE - Administrator, Civil Service (Medical): No    Lack of Transportation (Non-Medical): No  Physical Activity: Not on file  Stress: Not on file  Social Connections: Not on file  Intimate Partner Violence: Not At Risk (03/23/2023)   Humiliation, Afraid, Rape, and Kick questionnaire    Fear of Current or Ex-Partner: No    Emotionally Abused: No    Physically Abused: No    Sexually Abused: No    Family History:    Family History  Problem Relation Age of Onset   Diabetes Mother      ROS:  Constitutional: Denied fever, chills, malaise, night sweats Eyes: Denied vision change or loss Ears/Nose/Mouth/Throat: Denied ear ache, sore throat, coughing, sinus pain Cardiovascular:see HPI  Respiratory:see HPI  Gastrointestinal: Denied nausea, vomiting, abdominal pain, diarrhea Genital/Urinary: Denied dysuria, hematuria, urinary frequency/urgency Musculoskeletal: Denied muscle ache, joint pain, weakness Skin: see HPI  Neuro: Denied headache, dizziness,  syncope Psych: Denied history of depression/anxiety  Endocrine: history of diabetes   Physical Exam/Data:   Vitals:   03/24/23 0430 03/24/23 0844 03/24/23 1124 03/24/23 1713  BP: (!) 114/53 125/63 (!) 109/56 124/67  Pulse: (!) 128 (!) 121 (!) 130 (!) 140  Resp:  19  16  Temp: 99.5 F (37.5 C) 99.8 F (37.7 C) 99.3 F (37.4 C) (!) 100.9 F (38.3 C)  TempSrc:  Oral Oral  SpO2: 98% 96% 96% 98%  Weight:      Height:        Intake/Output Summary (Last 24 hours) at 03/24/2023 1727 Last data filed at 03/24/2023 1300 Gross per 24 hour  Intake 2273.43 ml  Output 425 ml  Net 1848.43 ml      03/23/2023    2:42 AM 03/22/2023    7:32 PM 01/19/2023   11:13 AM  Last 3 Weights  Weight (lbs) 299 lb 13.2 oz 299 lb 9.7 oz 299 lb 9.6 oz  Weight (kg) 136 kg 135.9 kg 135.898 kg     Body mass index is 41.82 kg/m.   Vitals:  Vitals:   03/24/23 1124 03/24/23 1713  BP: (!) 109/56 124/67  Pulse: (!) 130 (!) 140  Resp:  16  Temp: 99.3 F (37.4 C) (!) 100.9 F (38.3 C)  SpO2: 96% 98%   General Appearance: In no apparent distress, laying in bed, well nourished  HEENT: Normocephalic, atraumatic.  Neck: Supple, trachea midline, no JVDs Cardiovascular: tachycardiac, regular rhythm, S1S2, no murmur  Respiratory: Resting breathing unlabored, lungs sounds clear to auscultation bilaterally, no use of accessory muscles. On room air.  No wheezes, rales or rhonchi.   Gastrointestinal: Bowel sounds positive, abdomen soft, non-tender, non-distended Extremities: Able to move all extremities in bed without difficulty, RLE 1+ edema, right foot in dressing  Musculoskeletal: Normal muscle bulk and tone Skin: Intact, warm, dry.  Neurologic: Alert, oriented to person, place and time.  no cognitive deficit, no gross focal neuro deficit Psychiatric: Normal affect. Mood is appropriate.     EKG:  The EKG was personally reviewed and demonstrates:    EKG from 03/24/2023 at 818 revealed sinus tachycardia 122  bpm.    Repeat EKG 03/24/23 at 1639 showed narrow complex tachycardia at 140s, likely SVT   Telemetry:  Telemetry was personally reviewed and demonstrates:    Narrow complex tachycardia up to 140s, no definite flutter wave found, likely SVT , further review per Dr Mayford Knife   Relevant CV Studies:   Echo from 03/24/23:    1. Left ventricular ejection fraction, by estimation, is 70 to 75%. The  left ventricle has hyperdynamic function. The left ventricle has no  regional wall motion abnormalities. Left ventricular diastolic parameters  are indeterminate.   2. Right ventricular systolic function is hyperdynamic. The right  ventricular size is severely enlarged.   3. The mitral valve was not well visualized. No evidence of mitral valve  regurgitation. No evidence of mitral stenosis.   4. The aortic valve was not well visualized. Aortic valve regurgitation  is not visualized.   5. The inferior vena cava is normal in size with greater than 50%  respiratory variability, suggesting right atrial pressure of 3 mmHg.   Comparison(s): Prior images reviewed side by side. Function is now  hyperdynamic. Heart rate notably faster.    Laboratory Data:  High Sensitivity Troponin:  No results for input(s): "TROPONINIHS" in the last 720 hours.   Chemistry Recent Labs  Lab 03/22/23 2023 03/23/23 0227 03/24/23 0316  NA 130* 132* 137  K 5.0 4.3 3.6  CL 99 102 106  CO2 11* 12* 22  GLUCOSE 405* 410* 126*  BUN 26* 28* 21*  CREATININE 1.52* 1.70* 1.41*  CALCIUM 8.7* 8.7* 8.2*  MG  --  1.9 1.7  GFRNONAA 55* 48* >60  ANIONGAP 20* 18* 9    Recent Labs  Lab 03/22/23 2023 03/23/23 0227 03/24/23 0316  PROT 8.1 7.1 6.7  ALBUMIN 2.1* 1.8* 1.6*  AST 14* 11* 14*  ALT 15 13 14   ALKPHOS 64 56 50  BILITOT 1.2 1.1 0.4   Lipids No results for input(s): "CHOL", "TRIG", "HDL", "LABVLDL", "LDLCALC", "CHOLHDL" in the last 168 hours.  Hematology Recent Labs  Lab 03/23/23 0005 03/23/23 0227  03/24/23 0316  WBC 19.8* 19.1* 17.1*  RBC 3.15* 3.02* 3.09*  HGB 8.6* 8.2* 8.5*  HCT 27.3* 25.8* 26.0*  MCV 86.7 85.4 84.1  MCH 27.3 27.2 27.5  MCHC 31.5 31.8 32.7  RDW 15.2 14.9 14.6  PLT 497* 469* 465*   Thyroid  Recent Labs  Lab 03/24/23 0316  TSH 1.058  FREET4 1.18*    BNP Recent Labs  Lab 03/23/23 1859  BNP 462.8*    DDimer No results for input(s): "DDIMER" in the last 168 hours.   Radiology/Studies:  ECHOCARDIOGRAM COMPLETE  Result Date: 03/24/2023    ECHOCARDIOGRAM REPORT   Patient Name:   MISTER HUCKLE Date of Exam: 03/24/2023 Medical Rec #:  [id number]    Height:       71.0 in Accession #:    [id number]   Weight:       299.8 lb Date of Birth:  [birth year]     BSA:          2.504 m Patient Age:    52 years     BP:           114/53 mmHg Patient Gender: M            HR:           129 bpm. Exam Location:  Inpatient Procedure: 2D Echo, Cardiac Doppler, Color Doppler and Intracardiac            Opacification Agent Indications:    R00.8 Other abnormalities of heart beat  History:        Patient has prior history of Echocardiogram examinations, most                 recent 08/15/2022. Arrythmias:Atrial Fibrillation; Risk                 Factors:Diabetes, Dyslipidemia and Hypertension.  Sonographer:    Milbert Coulter Referring Phys: [phone] A CALDWELL POWELL JR  Sonographer Comments: Patient is obese. Image acquisition challenging due to patient body habitus. IMPRESSIONS  1. Left ventricular ejection fraction, by estimation, is 70 to 75%. The left ventricle has hyperdynamic function. The left ventricle has no regional wall motion abnormalities. Left ventricular diastolic parameters are indeterminate.  2. Right ventricular systolic function is hyperdynamic. The right ventricular size is severely enlarged.  3. The mitral valve was not well visualized. No evidence of mitral valve regurgitation. No evidence of mitral stenosis.  4. The aortic valve was not well visualized. Aortic valve regurgitation  is not visualized.  5. The inferior vena cava is normal in size with greater than 50% respiratory variability, suggesting right atrial pressure of 3 mmHg. Comparison(s): Prior images reviewed side by side. Function is now hyperdynamic. Heart rate notably faster. FINDINGS  Left Ventricle: Left ventricular ejection fraction, by estimation, is 70 to 75%. The left ventricle has hyperdynamic function. The left ventricle has no regional wall motion abnormalities. Definity contrast agent was given IV to delineate the left ventricular endocardial borders. The left ventricular internal cavity size was normal in size. Suboptimal image quality limits for assessment of left ventricular hypertrophy. Left ventricular diastolic parameters are indeterminate. Right Ventricle: The right ventricular size is severely enlarged. Right vetricular  wall thickness was not well visualized. Right ventricular systolic function is hyperdynamic. Left Atrium: Left atrial size was not well visualized. Right Atrium: Right atrial size was not well visualized. Pericardium: There is no evidence of pericardial effusion. Mitral Valve: The mitral valve was not well visualized. No evidence of mitral valve regurgitation. No evidence of mitral valve stenosis. Tricuspid Valve: The tricuspid valve is normal in structure. Tricuspid valve regurgitation is not demonstrated. No evidence of tricuspid stenosis. Aortic Valve: The aortic valve was not well visualized. Aortic valve regurgitation is not visualized. Aortic valve mean gradient measures 5.0 mmHg. Aortic valve peak gradient measures 8.3 mmHg. Aortic valve area, by VTI measures 2.61 cm. Pulmonic Valve: The pulmonic valve was not well visualized. Pulmonic valve regurgitation is not visualized. No evidence of pulmonic stenosis. Aorta: The aortic root is normal in size and structure. Venous: The inferior vena cava is normal in size with greater than 50% respiratory variability, suggesting right atrial pressure  of 3 mmHg. IAS/Shunts: The interatrial septum was not well visualized.  LEFT VENTRICLE PLAX 2D LVIDd:         4.00 cm LVIDs:         2.50 cm LV PW:         1.10 cm LV IVS:        1.20 cm LVOT diam:     1.90 cm LV SV:         48 LV SV Index:   19 LVOT Area:     2.84 cm  RIGHT VENTRICLE RV S prime:     14.40 cm/s TAPSE (M-mode): 2.1 cm LEFT ATRIUM             Index        RIGHT ATRIUM           Index LA diam:        3.50 cm 1.40 cm/m   RA Area:     17.50 cm LA Vol (A2C):   35.7 ml 14.25 ml/m  RA Volume:   47.80 ml  19.09 ml/m LA Vol (A4C):   37.9 ml 15.13 ml/m LA Biplane Vol: 39.7 ml 15.85 ml/m  AORTIC VALVE AV Area (Vmax):    2.64 cm AV Area (Vmean):   2.46 cm AV Area (VTI):     2.61 cm AV Vmax:           144.00 cm/s AV Vmean:          97.900 cm/s AV VTI:            0.185 m AV Peak Grad:      8.3 mmHg AV Mean Grad:      5.0 mmHg LVOT Vmax:         134.00 cm/s LVOT Vmean:        84.900 cm/s LVOT VTI:          0.170 m LVOT/AV VTI ratio: 0.92  SHUNTS Systemic VTI:  0.17 m Systemic Diam: 1.90 cm Riley Lam MD Electronically signed by Riley Lam MD Signature Date/Time: 03/24/2023/11:49:21 AM    Final    VAS [place] ABI WITH/WO TBI  Result Date: 03/23/2023  LOWER EXTREMITY DOPPLER STUDY Patient Name:  ILIYAN MOULD  Date of Exam:   03/23/2023 Medical Rec #: [id number]     Accession #:    [id number] Date of Birth: Dec 20, 1970      Patient Gender: M Patient Age:   86 years Exam Location:  [facility] Procedure:      VAS  [place] ABI WITH/WO TBI Referring Phys: A POWELL JR --------------------------------------------------------------------------------  Indications: Ulceration, and peripheral artery disease. High Risk Factors: Diabetes.  Comparison Study: No prior study Performing Technologist: Shona Simpson  Examination Guidelines: A complete evaluation includes at minimum, Doppler waveform signals and systolic blood pressure reading at the level of bilateral brachial, anterior tibial, and posterior  tibial arteries, when vessel segments are accessible. Bilateral testing is considered an integral part of a complete examination. Photoelectric Plethysmograph (PPG) waveforms and toe systolic pressure readings are included as required and additional duplex testing as needed. Limited examinations for reoccurring indications may be performed as noted.  ABI Findings: +--------+------------------+-----+---------+--------+ Right   Rt Pressure (mmHg)IndexWaveform Comment  +--------+------------------+-----+---------+--------+ [id number]                    triphasic         +--------+------------------+-----+---------+--------+ PTA     98                0.68 biphasic          +--------+------------------+-----+---------+--------+ DP      131               0.91 biphasic          +--------+------------------+-----+---------+--------+ +--------+------------------+-----+---------+-------+ Left    Lt Pressure (mmHg)IndexWaveform Comment +--------+------------------+-----+---------+-------+ [id number]                    triphasic        +--------+------------------+-----+---------+-------+ PTA     157               1.09 biphasic         +--------+------------------+-----+---------+-------+ DP      162               1.12 biphasic         +--------+------------------+-----+---------+-------+ TOES Findings:  Unable to insonate due to open wounds +---------+---------------+--------+-------+ Left ToesPressure (mmHg)WaveformComment +---------+---------------+--------+-------+ 3rd Digit               Normal          +---------+---------------+--------+-------+ Unable to assess other digits or test compression due to size/shape incompatibility with sensor   Summary: Right: Resting right ankle-brachial index indicates mild right lower extremity arterial disease. Left: Resting left ankle-brachial index is within normal range. *See table(s) above for measurements and observations.   Electronically signed by Lemar Livings MD on 03/23/2023 at 6:58:35 PM.    Final    VAS [place] LOWER EXTREMITY VENOUS (DVT)  Result Date: 03/23/2023  Lower Venous DVT Study Patient Name:  ZAXTON MANALAC  Date of Exam:   03/23/2023 Medical Rec #: [id number]     Accession #:    [id number] Date of Birth: [birth year]      Patient Gender: M Patient Age:   70 years Exam Location:  [facility] Procedure:      VAS [place] LOWER EXTREMITY VENOUS (DVT) Referring Phys: A POWELL JR --------------------------------------------------------------------------------  Indications: Edema, and ulceration.  Risk Factors: Limited mobility. Limitations: Body habitus. Comparison Study: No significant changes seen since prior exam 10/03/21. Performing Technologist: Shona Simpson  Examination Guidelines: A complete evaluation includes B-mode imaging, spectral Doppler, color Doppler, and power Doppler as needed of all accessible portions of each vessel. Bilateral testing is considered an integral part of a complete examination. Limited examinations for reoccurring indications may be performed as noted. The reflux portion of the exam is performed with the patient in reverse Trendelenburg.  +---------+---------------+---------+-----------+---------------+--------------+ RIGHT  CompressibilityPhasicitySpontaneityProperties     Thrombus Aging +---------+---------------+---------+-----------+---------------+--------------+ CFV      Full           Yes      Yes                                      +---------+---------------+---------+-----------+---------------+--------------+ SFJ      Full                                                             +---------+---------------+---------+-----------+---------------+--------------+ FV Prox  Full                                                             +---------+---------------+---------+-----------+---------------+--------------+ FV Mid   Full                                                              +---------+---------------+---------+-----------+---------------+--------------+ FV DistalFull                                                             +---------+---------------+---------+-----------+---------------+--------------+ PFV      Full                                                             +---------+---------------+---------+-----------+---------------+--------------+ POP      Full           Yes      Yes                                      +---------+---------------+---------+-----------+---------------+--------------+ PTV      Full                                                             +---------+---------------+---------+-----------+---------------+--------------+ PERO                                        Patent by color               +---------+---------------+---------+-----------+---------------+--------------+   +----+---------------+---------+-----------+----------+--------------+ LEFTCompressibilityPhasicitySpontaneityPropertiesThrombus Aging +----+---------------+---------+-----------+----------+--------------+ CFV Full  Yes      Yes                                 +----+---------------+---------+-----------+----------+--------------+    Summary: RIGHT: - There is no evidence of deep vein thrombosis in the lower extremity.  - No cystic structure found in the popliteal fossa.  LEFT: - No evidence of common femoral vein obstruction.  *See table(s) above for measurements and observations. Electronically signed by Lemar Livings MD on 03/23/2023 at 6:56:49 PM.    Final    NM Pulmonary Perfusion  Result Date: 03/23/2023 CLINICAL DATA:  Tachycardia, RIGHT leg swelling EXAM: NUCLEAR MEDICINE PERFUSION LUNG SCAN TECHNIQUE: Perfusion images were obtained in multiple projections after intravenous injection of radiopharmaceutical. RADIOPHARMACEUTICALS:  4.2 mCi Tc-53m MAA COMPARISON:  03/23/2023  FINDINGS: No wedge-shaped peripheral perfusion defect noted in the LEFT or RIGHT lung to suggest acute pulmonary embolism. Cardiac attenuation noted. IMPRESSION: No evidence acute pulmonary embolism. Electronically Signed   By: Genevive Bi M.D.   On: 03/23/2023 16:13   US RENAL  Result Date: 03/23/2023 CLINICAL DATA:  Acute kidney injury EXAM: RENAL / URINARY TRACT ULTRASOUND COMPLETE COMPARISON:  None Available. FINDINGS: Right Kidney: Renal measurements: 11.8 x 5.8 x 5.2 cm = volume: 183 mL. Echogenicity within normal limits. No mass or hydronephrosis visualized. Left Kidney: Renal measurements: 12.0 x 6.7 x 6.3 cm = volume: 266 mL. Echogenicity within normal limits. No mass or hydronephrosis visualized. Bladder: Not well visualized due to underdistention. Other: None. IMPRESSION: No hydronephrosis. Electronically Signed   By: Allegra Lai M.D.   On: 03/23/2023 13:49   DG Chest Port 1 View  Result Date: 03/23/2023 CLINICAL DATA:  Shortness of breath EXAM: PORTABLE CHEST 1 VIEW COMPARISON:  08/14/2022 FINDINGS: Cardiomegaly. Diffuse bilateral interstitial pulmonary opacity. The visualized skeletal structures are unremarkable. IMPRESSION: Cardiomegaly with diffuse bilateral interstitial pulmonary opacity, consistent with edema or atypical/viral infection. No focal airspace opacity. Electronically Signed   By: Jearld Lesch M.D.   On: 03/23/2023 12:24   DG Foot Complete Right  Result Date: 03/22/2023 CLINICAL DATA:  Possible diabetic infection. EXAM: RIGHT FOOT COMPLETE - 3+ VIEW COMPARISON:  None Available. FINDINGS: Erosive changes of the distal phalanges of the first and second digits. In the absence of history of amputation findings suspicious for osteomyelitis. No acute fracture. There is no dislocation. The bones are osteopenic. Diffuse skin thickening and soft tissue edema. Dressing noted over the forefoot. IMPRESSION: Erosive changes of the distal phalanges of the first and second digits  suspicious for osteomyelitis. Electronically Signed   By: Elgie Collard M.D.   On: 03/22/2023 20:11     Assessment and Plan:    Narrow complex tachycardia, likely SVT - Echo reassuring overall , TSH WNL - Mediated by sepsis, agree with antibiotic and surgery source control and hydration  - will increase PO metoprolol to 25mg  BID for rate control,  some degree tachycardia in normal in the setting of sepsis/fever, avoid aggressive rate slowing especially he is asymptomatic, treat fever/dehydration  RV enlargement  - perfusion study negative for PE - consider outpatient sleep study   HTN - BP controlled, hold valsartan/HCTZ  Sepsis 2/2 foot osteomyelitis AKI Type 2 DM  HLD - per primary team     Risk Assessment/Risk Scores:       For questions or updates, please contact [facility] Please consult [email or web address] for contact info under    Signed,  Cyndi Bender, NP  03/24/2023 5:27 PM

## 2023-03-24 NOTE — Progress Notes (Addendum)
TRH night cross cover note:   I was notified by RN that the patient has sinus tachycardia with sustained heart rates in the 120s.  He is asymptomatic, with stable blood pressure, with most recent blood pressure noted to be 111/60.  Additional vital signs notable for temperature 99.1, respiratory rate 20, oxygen saturation 97% on room air.  He is here with acute diabetic foot infection.  Regarding this patient's asymptomatic sinus tach, stable blood pressure, and an underlying source prompting the sinus tach (the acute diabetic foot infection), will continue to monitor HR and associated VS closely while continuing to tx the underlying causative infxn.    Update: mag level this AM is 1.7. I've ordered magnesium sulfate 2 g IV every 2 hours, which may also have some relative bradycardic implications in the setting of his sinus tachycardia.    Newton Pigg, DO Hospitalist

## 2023-03-24 NOTE — TOC CM/SW Note (Signed)
Transition of Care [facility]) - Inpatient Brief Assessment   Patient Details  Name: Taylor Hughes MRN: [id number] Date of Birth: 11/06/1970  [facility]) CM/SW Contact:    Tom-Johnson, Hershal Coria, RN Phone Number: 03/24/2023, 4:58 PM   Clinical Narrative:  Patient presented to the ED with Diabetic Rt Foot Infection. Currently on IV abx. Orthopedic following.  From home with Significant Other. Not currently employed. Independent. Does not have children, no parents. Has four supportive sisters.  Does not have DME's at home. PCP is Hoy Register, MD and uses [facility].   Plan for Transmetatarsal Amputation tomorrow 03/25/23 by Dr. [name].   CM will continue to follow as patient progresses with care towards discharge.       Transition of Care Asessment: Insurance and Status: Insurance coverage has been reviewed Patient has primary care physician: Yes Home environment has been reviewed: Yes Prior level of function:: Independent   Social Determinants of Health Reivew: SDOH reviewed no interventions necessary Readmission risk has been reviewed: Yes Transition of care needs: transition of care needs identified, TOC will continue to follow

## 2023-03-24 NOTE — Progress Notes (Signed)
TRH night cross cover note:   I was notified by RN that the patient is complaining of some gas pains.  I subsequently placed order for prn simethicone.      Newton Pigg, DO Hospitalist

## 2023-03-24 NOTE — Progress Notes (Signed)
   03/24/23 1713  Assess: MEWS Score  Temp (!) 100.9 F (38.3 C)  BP 124/67  MAP (mmHg) 81  Pulse Rate (!) 140  Resp 16  SpO2 98 %  O2 Device Room Air  Assess: MEWS Score  MEWS Temp 1  MEWS Systolic 0  MEWS Pulse 3  MEWS RR 0  MEWS LOC 0  MEWS Score 4  MEWS Score Color Red  Assess: if the MEWS score is Yellow or Red  Were vital signs accurate and taken at a resting state? Yes  Does the patient meet 2 or more of the SIRS criteria? Yes  Does the patient have a confirmed or suspected source of infection? Yes  MEWS guidelines implemented  Yes, red  Treat  MEWS Interventions Considered administering scheduled or prn medications/treatments as ordered  Take Vital Signs  Increase Vital Sign Frequency  Red: Q1hr x2, continue Q4hrs until patient remains green for 12hrs  Escalate  MEWS: Escalate Red: Discuss with charge nurse and notify provider. Consider notifying RRT. If remains red for 2 hours consider need for higher level of care  Notify: Charge Nurse/RN  Name of Charge Nurse/RN Notified Marianna Fuss, RN  Provider Notification  Provider Name/Title Dr. Lowell Guitar  Date Provider Notified 03/24/23  Time Provider Notified 1720  Method of Notification Face-to-face  Notification Reason Change in status  Provider response See new orders  Date of Provider Response 03/24/23  Time of Provider Response 1649  Notify: Rapid Response  Name of Rapid Response RN Notified Rapid response  Date Rapid Response Notified 03/24/23  Time Rapid Response Notified 1725  Assess: SIRS CRITERIA  SIRS Temperature  0  SIRS Pulse 1  SIRS Respirations  0  SIRS WBC 0  SIRS Score Sum  1

## 2023-03-24 NOTE — Progress Notes (Signed)
Patient transferred to [id number]. Gave report to the receiving RN and answered all questions.

## 2023-03-24 NOTE — H&P (View-Only) (Signed)
ORTHOPAEDIC CONSULTATION  REQUESTING PHYSICIAN: Zigmund Daniel., *  Chief Complaint: Ulceration and purulent drainage right toes.  HPI: Crit Schor is a 52 y.o. male who presents with abscess and osteomyelitis right toes.  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  He has lymphedema of the right leg with uncontrolled type 2 diabetes with severe protein caloric malnutrition.  Past Medical History:  Diagnosis Date   Atrial fibrillation (HCC)    Diabetes mellitus without complication (HCC)    Type 2   GERD (gastroesophageal reflux disease)    Wears glasses    Wound, open, foot    left diabetic    Past Surgical History:  Procedure Laterality Date   AMPUTATION Left 08/20/2018   Procedure: LEFT FOOT IRRIGATON AND DEBRIDEMENT, 5TH RAY AMPUTATION;  Surgeon: Nadara Mustard, MD;  Location: [facility] OR;  Service: Orthopedics;  Laterality: Left;   APPLICATION OF A-CELL OF CHEST/ABDOMEN N/A 05/30/2019   Procedure: Application Of A-Cell Of Chest;  Surgeon: Corliss Skains, MD;  Location: [facility] OR;  Service: Cardiothoracic;  Laterality: N/A;   APPLICATION OF WOUND VAC N/A 05/30/2019   Procedure: Application Of Wound Vac;  Surgeon: Corliss Skains, MD;  Location: [facility] OR;  Service: Cardiothoracic;  Laterality: N/A;   FINGER SURGERY Right 2016   I&D  small finger   I & D EXTREMITY Left 07/30/2018   Procedure: IRRIGATION AND DEBRIDEMENT LEFT FOOT WITH POSSIBLE AMPUTATION OF FIFTH TOE;  Surgeon: Kathryne Hitch, MD;  Location: [facility] ORS;  Service: Orthopedics;  Laterality: Left;   INCISION AND DRAINAGE ABSCESS Left 01/19/2019   Procedure: INCISION AND DRAINAGE ABSCESS upper chest;  Surgeon: Christia Reading, MD;  Location: [facility] OR;  Service: ENT;  Laterality: Left;   IRRIGATION AND DEBRIDEMENT STERNOCLAVICULAR JOINT-STERNUM AND RIBS N/A 05/30/2019   Procedure: IRRIGATION AND DEBRIDEMENT OF STERNOCLAVICULAR JOINT-STERNUM AND RIBS ;  Surgeon: Corliss Skains, MD;  Location: [facility] OR;  Service: Cardiothoracic;  Laterality: N/A;   MINOR IRRIGATION AND DEBRIDEMENT OF WOUND N/A 11/22/2018   Procedure: INCISION AND DRAINAGE OF NECK ABSCESS;  Surgeon: Christia Reading, MD;  Location: [facility] ORS;  Service: ENT;  Laterality: N/A;   Social History   Socioeconomic History   Marital status: Divorced    Spouse name: Not on file   Number of children: Not on file   Years of education: Not on file   Highest education level: Not on file  Occupational History   Not on file  Tobacco Use   Smoking status: Never   Smokeless tobacco: Never  Vaping Use   Vaping status: Never Used  Substance and Sexual Activity   Alcohol use: No    Alcohol/week: 0.0 standard drinks of alcohol   Drug use: No   Sexual activity: Not on file  Other Topics Concern   Not on file  Social History Narrative   Not on file   Social Determinants of Health   Financial Resource Strain: Not on file  Food Insecurity: No Food Insecurity (03/23/2023)   Hunger Vital Sign    Worried About Running Out of Food in the Last Year: Never true    Ran Out of Food in the Last Year: Never true  Transportation Needs: No Transportation Needs (03/23/2023)   PRAPARE - Administrator, Civil Service (Medical): No    Lack of Transportation (Non-Medical): No  Physical Activity: Not on file  Stress: Not on file  Social Connections: Not  on file   Family History  Problem Relation Age of Onset   Diabetes Mother    - negative except otherwise stated in the family history section No Known Allergies Prior to Admission medications   Medication Sig Start Date End Date Taking? Authorizing Provider  acetaminophen (TYLENOL) 325 MG tablet Take 650 mg by mouth every 6 (six) hours as needed for mild pain, fever or headache.    [provider]  albuterol (VENTOLIN HFA) 108 (90 Base) MCG/ACT inhaler Inhale 2 puffs into the lungs every 6 (six) hours as needed for wheezing or shortness of breath. 10/07/22    Hoy Register, MD  aspirin EC 81 MG tablet Take 81 mg by mouth daily. Swallow whole.    [provider]  atorvastatin (LIPITOR) 20 MG tablet Take 1 tablet (20 mg total) by mouth daily. 01/19/23   Hoy Register, MD  Blood Glucose Monitoring Suppl (TRUE METRIX METER) w/Device KIT Use to check blood sugar three times daily. 03/02/23   Hoy Register, MD  buPROPion (WELLBUTRIN XL) 150 MG 24 hr tablet Take 1 tablet (150 mg total) by mouth daily. 01/19/23   Hoy Register, MD  feeding supplement, GLUCERNA SHAKE, (GLUCERNA SHAKE) LIQD Take 237 mLs by mouth 3 (three) times daily between meals. 08/21/22   Rolly Salter, MD  furosemide (LASIX) 20 MG tablet Take 1 tablet (20 mg total) by mouth daily. 01/19/23   Hoy Register, MD  gabapentin (NEURONTIN) 300 MG capsule Take 1 capsule (300 mg total) by mouth 2 (two) times daily. 01/19/23   Hoy Register, MD  glucose blood (TRUE METRIX BLOOD GLUCOSE TEST) test strip Use as directed 02/04/23   Hoy Register, MD  HYDROcodone-acetaminophen (NORCO/VICODIN) 5-325 MG tablet Take 1 tablet every 6 hours by oral route as needed for 7 days. 10/31/22     Insulin Glargine (BASAGLAR KWIKPEN) 100 UNIT/ML Inject 30 Units into the skin daily. 01/19/23   Hoy Register, MD  insulin lispro (HUMALOG KWIKPEN) 100 UNIT/ML KwikPen Inject 0-12 units into the skin three times daily with meals. Per sliding scale. 01/19/23   Hoy Register, MD  Insulin Pen Needle 32G X 6 MM MISC USE AS DIRECTED 11/17/22   Hoy Register, MD  metoCLOPramide (REGLAN) 10 MG tablet Take 1 tablet (10 mg total) by mouth 3 (three) times daily before meals for 7 days. 08/21/22 08/28/22  Rolly Salter, MD  omeprazole (PRILOSEC) 40 MG capsule Take 1 capsule (40 mg total) by mouth daily. 01/19/23   Hoy Register, MD  Semaglutide, 1 MG/DOSE, 4 MG/3ML SOPN Inject 1 mg as directed once a week. 01/19/23   Hoy Register, MD  Semaglutide,0.25 or 0.5MG /DOS, (OZEMPIC, 0.25 OR 0.5 MG/DOSE,) 2 MG/3ML SOPN  Inject 0.5 mg into the skin once a week. For 4 weeks then increase to 1mg  01/19/23   Hoy Register, MD  sildenafil (VIAGRA) 50 MG tablet Take 1 tablet (50 mg total) by mouth daily as needed for erectile dysfunction. At least 24 hours between doses 03/12/23   Hoy Register, MD  TRUEplus Lancets 28G MISC Use to check blood sugar three times daily. 03/02/23   Hoy Register, MD  valsartan-hydrochlorothiazide (DIOVAN-HCT) 160-25 MG tablet Take 1 tablet by mouth daily. 01/19/23   Hoy Register, MD   VAS [place] ABI WITH/WO TBI  Result Date: 03/23/2023  LOWER EXTREMITY DOPPLER STUDY Patient Name:  HURMAN PRUSINOWSKI  Date of Exam:   03/23/2023 Medical Rec #: [id number]     Accession #:    [id number] Date of Birth:  04-03-1971      Patient Gender: M Patient Age:   23 years Exam Location:  [facility] Procedure:      VAS [place] ABI WITH/WO TBI Referring Phys: A POWELL JR --------------------------------------------------------------------------------  Indications: Ulceration, and peripheral artery disease. High Risk Factors: Diabetes.  Comparison Study: No prior study Performing Technologist: Shona Simpson  Examination Guidelines: A complete evaluation includes at minimum, Doppler waveform signals and systolic blood pressure reading at the level of bilateral brachial, anterior tibial, and posterior tibial arteries, when vessel segments are accessible. Bilateral testing is considered an integral part of a complete examination. Photoelectric Plethysmograph (PPG) waveforms and toe systolic pressure readings are included as required and additional duplex testing as needed. Limited examinations for reoccurring indications may be performed as noted.  ABI Findings: +--------+------------------+-----+---------+--------+ Right   Rt Pressure (mmHg)IndexWaveform Comment  +--------+------------------+-----+---------+--------+ [id number]                    triphasic          +--------+------------------+-----+---------+--------+ PTA     98                0.68 biphasic          +--------+------------------+-----+---------+--------+ DP      131               0.91 biphasic          +--------+------------------+-----+---------+--------+ +--------+------------------+-----+---------+-------+ Left    Lt Pressure (mmHg)IndexWaveform Comment +--------+------------------+-----+---------+-------+ [id number]                    triphasic        +--------+------------------+-----+---------+-------+ PTA     157               1.09 biphasic         +--------+------------------+-----+---------+-------+ DP      162               1.12 biphasic         +--------+------------------+-----+---------+-------+ TOES Findings:  Unable to insonate due to open wounds +---------+---------------+--------+-------+ Left ToesPressure (mmHg)WaveformComment +---------+---------------+--------+-------+ 3rd Digit               Normal          +---------+---------------+--------+-------+ Unable to assess other digits or test compression due to size/shape incompatibility with sensor   Summary: Right: Resting right ankle-brachial index indicates mild right lower extremity arterial disease. Left: Resting left ankle-brachial index is within normal range. *See table(s) above for measurements and observations.  Electronically signed by Lemar Livings MD on 03/23/2023 at 6:58:35 PM.    Final    VAS [place] LOWER EXTREMITY VENOUS (DVT)  Result Date: 03/23/2023  Lower Venous DVT Study Patient Name:  DARIES LARDIERI  Date of Exam:   03/23/2023 Medical Rec #: [id number]     Accession #:    [id number] Date of Birth: 11-May-1971      Patient Gender: M Patient Age:   2 years Exam Location:  [facility] Procedure:      VAS [place] LOWER EXTREMITY VENOUS (DVT) Referring Phys: A POWELL JR --------------------------------------------------------------------------------  Indications: Edema, and ulceration.   Risk Factors: Limited mobility. Limitations: Body habitus. Comparison Study: No significant changes seen since prior exam 10/03/21. Performing Technologist: Shona Simpson  Examination Guidelines: A complete evaluation includes B-mode imaging, spectral Doppler, color Doppler, and power Doppler as needed of all accessible portions of each vessel. Bilateral testing is considered an integral part of a complete examination. Limited examinations  for reoccurring indications may be performed as noted. The reflux portion of the exam is performed with the patient in reverse Trendelenburg.  +---------+---------------+---------+-----------+---------------+--------------+ RIGHT    CompressibilityPhasicitySpontaneityProperties     Thrombus Aging +---------+---------------+---------+-----------+---------------+--------------+ CFV      Full           Yes      Yes                                      +---------+---------------+---------+-----------+---------------+--------------+ SFJ      Full                                                             +---------+---------------+---------+-----------+---------------+--------------+ FV Prox  Full                                                             +---------+---------------+---------+-----------+---------------+--------------+ FV Mid   Full                                                             +---------+---------------+---------+-----------+---------------+--------------+ FV DistalFull                                                             +---------+---------------+---------+-----------+---------------+--------------+ PFV      Full                                                             +---------+---------------+---------+-----------+---------------+--------------+ POP      Full           Yes      Yes                                       +---------+---------------+---------+-----------+---------------+--------------+ PTV      Full                                                             +---------+---------------+---------+-----------+---------------+--------------+ PERO                                        Patent  by color               +---------+---------------+---------+-----------+---------------+--------------+   +----+---------------+---------+-----------+----------+--------------+ LEFTCompressibilityPhasicitySpontaneityPropertiesThrombus Aging +----+---------------+---------+-----------+----------+--------------+ CFV Full           Yes      Yes                                 +----+---------------+---------+-----------+----------+--------------+    Summary: RIGHT: - There is no evidence of deep vein thrombosis in the lower extremity.  - No cystic structure found in the popliteal fossa.  LEFT: - No evidence of common femoral vein obstruction.  *See table(s) above for measurements and observations. Electronically signed by Lemar Livings MD on 03/23/2023 at 6:56:49 PM.    Final    NM Pulmonary Perfusion  Result Date: 03/23/2023 CLINICAL DATA:  Tachycardia, RIGHT leg swelling EXAM: NUCLEAR MEDICINE PERFUSION LUNG SCAN TECHNIQUE: Perfusion images were obtained in multiple projections after intravenous injection of radiopharmaceutical. RADIOPHARMACEUTICALS:  4.2 mCi Tc-42m MAA COMPARISON:  03/23/2023 FINDINGS: No wedge-shaped peripheral perfusion defect noted in the LEFT or RIGHT lung to suggest acute pulmonary embolism. Cardiac attenuation noted. IMPRESSION: No evidence acute pulmonary embolism. Electronically Signed   By: Genevive Bi M.D.   On: 03/23/2023 16:13   US RENAL  Result Date: 03/23/2023 CLINICAL DATA:  Acute kidney injury EXAM: RENAL / URINARY TRACT ULTRASOUND COMPLETE COMPARISON:  None Available. FINDINGS: Right Kidney: Renal measurements: 11.8 x 5.8 x 5.2 cm = volume: 183 mL. Echogenicity  within normal limits. No mass or hydronephrosis visualized. Left Kidney: Renal measurements: 12.0 x 6.7 x 6.3 cm = volume: 266 mL. Echogenicity within normal limits. No mass or hydronephrosis visualized. Bladder: Not well visualized due to underdistention. Other: None. IMPRESSION: No hydronephrosis. Electronically Signed   By: Allegra Lai M.D.   On: 03/23/2023 13:49   DG Chest Port 1 View  Result Date: 03/23/2023 CLINICAL DATA:  Shortness of breath EXAM: PORTABLE CHEST 1 VIEW COMPARISON:  08/14/2022 FINDINGS: Cardiomegaly. Diffuse bilateral interstitial pulmonary opacity. The visualized skeletal structures are unremarkable. IMPRESSION: Cardiomegaly with diffuse bilateral interstitial pulmonary opacity, consistent with edema or atypical/viral infection. No focal airspace opacity. Electronically Signed   By: Jearld Lesch M.D.   On: 03/23/2023 12:24   DG Foot Complete Right  Result Date: 03/22/2023 CLINICAL DATA:  Possible diabetic infection. EXAM: RIGHT FOOT COMPLETE - 3+ VIEW COMPARISON:  None Available. FINDINGS: Erosive changes of the distal phalanges of the first and second digits. In the absence of history of amputation findings suspicious for osteomyelitis. No acute fracture. There is no dislocation. The bones are osteopenic. Diffuse skin thickening and soft tissue edema. Dressing noted over the forefoot. IMPRESSION: Erosive changes of the distal phalanges of the first and second digits suspicious for osteomyelitis. Electronically Signed   By: Elgie Collard M.D.   On: 03/22/2023 20:11   - pertinent xrays, CT, MRI studies were reviewed and independently interpreted  Positive ROS: All other systems have been reviewed and were otherwise negative with the exception of those mentioned in the HPI and as above.  Physical Exam: General: Alert, no acute distress Psychiatric: Patient is competent for consent with normal mood and affect Lymphatic: No axillary or cervical  lymphadenopathy Cardiovascular: No pedal edema Respiratory: No cyanosis, no use of accessory musculature GI: No organomegaly, abdomen is soft and non-tender    Images:  @ENCIMAGES @  Labs:  Lab Results  Component Value Date   HGBA1C 9.8 (H) 03/23/2023  HGBA1C 9.7 (A) 01/19/2023   HGBA1C 11.9 (H) 08/14/2022   ESRSEDRATE >140 (H) 03/22/2023   CRP 30.8 (H) 03/22/2023   REPTSTATUS PENDING 03/22/2023   GRAMSTAIN  05/30/2019    MODERATE WBC PRESENT, PREDOMINANTLY PMN FEW GRAM POSITIVE COCCI IN PAIRS    CULT  03/22/2023    NO GROWTH < 12 HOURS Performed at [facility], [address], [place], [place] [address]    [facility] STAPHYLOCOCCUS AUREUS 05/30/2019    Lab Results  Component Value Date   ALBUMIN 1.6 (L) 03/24/2023   ALBUMIN 1.8 (L) 03/23/2023   ALBUMIN 2.1 (L) 03/22/2023        Latest Ref Rng & Units 03/24/2023    3:16 AM 03/23/2023    2:27 AM 03/23/2023   12:05 AM  CBC EXTENDED  WBC 4.0 - 10.5 K/uL 17.1  19.1  19.8   RBC 4.22 - 5.81 MIL/uL 3.09  3.02  3.15   Hemoglobin 13.0 - 17.0 g/dL 8.5  8.2  8.6   HCT 60.4 - 52.0 % 26.0  25.8  27.3   Platelets 150 - 400 K/uL 465  469  497   NEUT# 1.7 - 7.7 K/uL 14.3  17.0    Lymph# 0.7 - 4.0 K/uL 1.4  0.9      Neurologic: Patient does not have protective sensation bilateral lower extremities.   MUSCULOSKELETAL:   Skin: Examination patient has purulent drainage from the third toe right foot.  There is sausage digit swelling of the great toe second toe and third toe.  There is no ascending cellulitis.  The calf is soft nontender with lymphedema no ulcers.  Patient has a strong palpable dorsalis pedis pulse bilaterally.  No ulcers or cellulitis in the left foot.  White cell count has decreased to 17.1 from 19.1.  Hemoglobin stable at 8.5.  Absolute neutrophil lymphocyte count has improved as well.  Patient has albumin of 1.8.  A1c 11.9.  CRP 30.  Sed rate greater than 140.  Review of the radiographs shows  chronic destruction of the tuft of the right great toe with destructive changes of the second toe.  Ankle-brachial indices shows biphasic flow bilaterally with diminished right posterior tibial artery pressure with a normal right dorsalis pedis pressure.  Assessment: Assessment: Abscess and osteomyelitis right forefoot with uncontrolled type 2 diabetes, severe protein caloric malnutrition with lymphedema right lower extremity.  Plan: Plan: Will plan for transmetatarsal amputation on the right Wednesday.  Risk and benefits were discussed including potential for additional surgery.  Patient states he understands wishes to proceed at this time.  Thank you for the consult and the opportunity to see Mr. Tyrel Vandruff, MD [facility] [phone] 7:13 AM

## 2023-03-24 NOTE — Consult Note (Signed)
ORTHOPAEDIC CONSULTATION  REQUESTING PHYSICIAN: Zigmund Daniel., *  Chief Complaint: Ulceration and purulent drainage right toes.  HPI: Taylor Hughes is a 51 y.o. male who presents with abscess and osteomyelitis right toes.  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  He has lymphedema of the right leg with uncontrolled type 2 diabetes with severe protein caloric malnutrition.  Past Medical History:  Diagnosis Date   Atrial fibrillation (HCC)    Diabetes mellitus without complication (HCC)    Type 2   GERD (gastroesophageal reflux disease)    Wears glasses    Wound, open, foot    left diabetic    Past Surgical History:  Procedure Laterality Date   AMPUTATION Left 08/20/2018   Procedure: LEFT FOOT IRRIGATON AND DEBRIDEMENT, 5TH RAY AMPUTATION;  Surgeon: Nadara Mustard, MD;  Location: [facility] OR;  Service: Orthopedics;  Laterality: Left;   APPLICATION OF A-CELL OF CHEST/ABDOMEN N/A 05/30/2019   Procedure: Application Of A-Cell Of Chest;  Surgeon: Corliss Skains, MD;  Location: [facility] OR;  Service: Cardiothoracic;  Laterality: N/A;   APPLICATION OF WOUND VAC N/A 05/30/2019   Procedure: Application Of Wound Vac;  Surgeon: Corliss Skains, MD;  Location: [facility] OR;  Service: Cardiothoracic;  Laterality: N/A;   FINGER SURGERY Right 2016   I&D  small finger   I & D EXTREMITY Left 07/30/2018   Procedure: IRRIGATION AND DEBRIDEMENT LEFT FOOT WITH POSSIBLE AMPUTATION OF FIFTH TOE;  Surgeon: Kathryne Hitch, MD;  Location: [facility] ORS;  Service: Orthopedics;  Laterality: Left;   INCISION AND DRAINAGE ABSCESS Left 01/19/2019   Procedure: INCISION AND DRAINAGE ABSCESS upper chest;  Surgeon: Christia Reading, MD;  Location: [facility] OR;  Service: ENT;  Laterality: Left;   IRRIGATION AND DEBRIDEMENT STERNOCLAVICULAR JOINT-STERNUM AND RIBS N/A 05/30/2019   Procedure: IRRIGATION AND DEBRIDEMENT OF STERNOCLAVICULAR JOINT-STERNUM AND RIBS ;  Surgeon: Corliss Skains, MD;  Location: [facility] OR;  Service: Cardiothoracic;  Laterality: N/A;   MINOR IRRIGATION AND DEBRIDEMENT OF WOUND N/A 11/22/2018   Procedure: INCISION AND DRAINAGE OF NECK ABSCESS;  Surgeon: Christia Reading, MD;  Location: [facility] ORS;  Service: ENT;  Laterality: N/A;   Social History   Socioeconomic History   Marital status: Divorced    Spouse name: Not on file   Number of children: Not on file   Years of education: Not on file   Highest education level: Not on file  Occupational History   Not on file  Tobacco Use   Smoking status: Never   Smokeless tobacco: Never  Vaping Use   Vaping status: Never Used  Substance and Sexual Activity   Alcohol use: No    Alcohol/week: 0.0 standard drinks of alcohol   Drug use: No   Sexual activity: Not on file  Other Topics Concern   Not on file  Social History Narrative   Not on file   Social Determinants of Health   Financial Resource Strain: Not on file  Food Insecurity: No Food Insecurity (03/23/2023)   Hunger Vital Sign    Worried About Running Out of Food in the Last Year: Never true    Ran Out of Food in the Last Year: Never true  Transportation Needs: No Transportation Needs (03/23/2023)   PRAPARE - Administrator, Civil Service (Medical): No    Lack of Transportation (Non-Medical): No  Physical Activity: Not on file  Stress: Not on file  Social Connections: Not  on file   Family History  Problem Relation Age of Onset   Diabetes Mother    - negative except otherwise stated in the family history section No Known Allergies Prior to Admission medications   Medication Sig Start Date End Date Taking? Authorizing Provider  acetaminophen (TYLENOL) 325 MG tablet Take 650 mg by mouth every 6 (six) hours as needed for mild pain, fever or headache.    [provider]  albuterol (VENTOLIN HFA) 108 (90 Base) MCG/ACT inhaler Inhale 2 puffs into the lungs every 6 (six) hours as needed for wheezing or shortness of breath. 10/07/22    Hoy Register, MD  aspirin EC 81 MG tablet Take 81 mg by mouth daily. Swallow whole.    [provider]  atorvastatin (LIPITOR) 20 MG tablet Take 1 tablet (20 mg total) by mouth daily. 01/19/23   Hoy Register, MD  Blood Glucose Monitoring Suppl (TRUE METRIX METER) w/Device KIT Use to check blood sugar three times daily. 03/02/23   Hoy Register, MD  buPROPion (WELLBUTRIN XL) 150 MG 24 hr tablet Take 1 tablet (150 mg total) by mouth daily. 01/19/23   Hoy Register, MD  feeding supplement, GLUCERNA SHAKE, (GLUCERNA SHAKE) LIQD Take 237 mLs by mouth 3 (three) times daily between meals. 08/21/22   Rolly Salter, MD  furosemide (LASIX) 20 MG tablet Take 1 tablet (20 mg total) by mouth daily. 01/19/23   Hoy Register, MD  gabapentin (NEURONTIN) 300 MG capsule Take 1 capsule (300 mg total) by mouth 2 (two) times daily. 01/19/23   Hoy Register, MD  glucose blood (TRUE METRIX BLOOD GLUCOSE TEST) test strip Use as directed 02/04/23   Hoy Register, MD  HYDROcodone-acetaminophen (NORCO/VICODIN) 5-325 MG tablet Take 1 tablet every 6 hours by oral route as needed for 7 days. 10/31/22     Insulin Glargine (BASAGLAR KWIKPEN) 100 UNIT/ML Inject 30 Units into the skin daily. 01/19/23   Hoy Register, MD  insulin lispro (HUMALOG KWIKPEN) 100 UNIT/ML KwikPen Inject 0-12 units into the skin three times daily with meals. Per sliding scale. 01/19/23   Hoy Register, MD  Insulin Pen Needle 32G X 6 MM MISC USE AS DIRECTED 11/17/22   Hoy Register, MD  metoCLOPramide (REGLAN) 10 MG tablet Take 1 tablet (10 mg total) by mouth 3 (three) times daily before meals for 7 days. 08/21/22 08/28/22  Rolly Salter, MD  omeprazole (PRILOSEC) 40 MG capsule Take 1 capsule (40 mg total) by mouth daily. 01/19/23   Hoy Register, MD  Semaglutide, 1 MG/DOSE, 4 MG/3ML SOPN Inject 1 mg as directed once a week. 01/19/23   Hoy Register, MD  Semaglutide,0.25 or 0.5MG /DOS, (OZEMPIC, 0.25 OR 0.5 MG/DOSE,) 2 MG/3ML SOPN  Inject 0.5 mg into the skin once a week. For 4 weeks then increase to 1mg  01/19/23   Hoy Register, MD  sildenafil (VIAGRA) 50 MG tablet Take 1 tablet (50 mg total) by mouth daily as needed for erectile dysfunction. At least 24 hours between doses 03/12/23   Hoy Register, MD  TRUEplus Lancets 28G MISC Use to check blood sugar three times daily. 03/02/23   Hoy Register, MD  valsartan-hydrochlorothiazide (DIOVAN-HCT) 160-25 MG tablet Take 1 tablet by mouth daily. 01/19/23   Hoy Register, MD   VAS [place] ABI WITH/WO TBI  Result Date: 03/23/2023  LOWER EXTREMITY DOPPLER STUDY Patient Name:  HURMAN PRUSINOWSKI  Date of Exam:   03/23/2023 Medical Rec #: [id number]     Accession #:    [id number] Date of Birth:  04-03-1971      Patient Gender: M Patient Age:   23 years Exam Location:  [facility] Procedure:      VAS [place] ABI WITH/WO TBI Referring Phys: A POWELL JR --------------------------------------------------------------------------------  Indications: Ulceration, and peripheral artery disease. High Risk Factors: Diabetes.  Comparison Study: No prior study Performing Technologist: Shona Simpson  Examination Guidelines: A complete evaluation includes at minimum, Doppler waveform signals and systolic blood pressure reading at the level of bilateral brachial, anterior tibial, and posterior tibial arteries, when vessel segments are accessible. Bilateral testing is considered an integral part of a complete examination. Photoelectric Plethysmograph (PPG) waveforms and toe systolic pressure readings are included as required and additional duplex testing as needed. Limited examinations for reoccurring indications may be performed as noted.  ABI Findings: +--------+------------------+-----+---------+--------+ Right   Rt Pressure (mmHg)IndexWaveform Comment  +--------+------------------+-----+---------+--------+ [id number]                    triphasic          +--------+------------------+-----+---------+--------+ PTA     98                0.68 biphasic          +--------+------------------+-----+---------+--------+ DP      131               0.91 biphasic          +--------+------------------+-----+---------+--------+ +--------+------------------+-----+---------+-------+ Left    Lt Pressure (mmHg)IndexWaveform Comment +--------+------------------+-----+---------+-------+ [id number]                    triphasic        +--------+------------------+-----+---------+-------+ PTA     157               1.09 biphasic         +--------+------------------+-----+---------+-------+ DP      162               1.12 biphasic         +--------+------------------+-----+---------+-------+ TOES Findings:  Unable to insonate due to open wounds +---------+---------------+--------+-------+ Left ToesPressure (mmHg)WaveformComment +---------+---------------+--------+-------+ 3rd Digit               Normal          +---------+---------------+--------+-------+ Unable to assess other digits or test compression due to size/shape incompatibility with sensor   Summary: Right: Resting right ankle-brachial index indicates mild right lower extremity arterial disease. Left: Resting left ankle-brachial index is within normal range. *See table(s) above for measurements and observations.  Electronically signed by Lemar Livings MD on 03/23/2023 at 6:58:35 PM.    Final    VAS [place] LOWER EXTREMITY VENOUS (DVT)  Result Date: 03/23/2023  Lower Venous DVT Study Patient Name:  DARIES LARDIERI  Date of Exam:   03/23/2023 Medical Rec #: [id number]     Accession #:    [id number] Date of Birth: 11-May-1971      Patient Gender: M Patient Age:   2 years Exam Location:  [facility] Procedure:      VAS [place] LOWER EXTREMITY VENOUS (DVT) Referring Phys: A POWELL JR --------------------------------------------------------------------------------  Indications: Edema, and ulceration.   Risk Factors: Limited mobility. Limitations: Body habitus. Comparison Study: No significant changes seen since prior exam 10/03/21. Performing Technologist: Shona Simpson  Examination Guidelines: A complete evaluation includes B-mode imaging, spectral Doppler, color Doppler, and power Doppler as needed of all accessible portions of each vessel. Bilateral testing is considered an integral part of a complete examination. Limited examinations  for reoccurring indications may be performed as noted. The reflux portion of the exam is performed with the patient in reverse Trendelenburg.  +---------+---------------+---------+-----------+---------------+--------------+ RIGHT    CompressibilityPhasicitySpontaneityProperties     Thrombus Aging +---------+---------------+---------+-----------+---------------+--------------+ CFV      Full           Yes      Yes                                      +---------+---------------+---------+-----------+---------------+--------------+ SFJ      Full                                                             +---------+---------------+---------+-----------+---------------+--------------+ FV Prox  Full                                                             +---------+---------------+---------+-----------+---------------+--------------+ FV Mid   Full                                                             +---------+---------------+---------+-----------+---------------+--------------+ FV DistalFull                                                             +---------+---------------+---------+-----------+---------------+--------------+ PFV      Full                                                             +---------+---------------+---------+-----------+---------------+--------------+ POP      Full           Yes      Yes                                       +---------+---------------+---------+-----------+---------------+--------------+ PTV      Full                                                             +---------+---------------+---------+-----------+---------------+--------------+ PERO                                        Patent  by color               +---------+---------------+---------+-----------+---------------+--------------+   +----+---------------+---------+-----------+----------+--------------+ LEFTCompressibilityPhasicitySpontaneityPropertiesThrombus Aging +----+---------------+---------+-----------+----------+--------------+ CFV Full           Yes      Yes                                 +----+---------------+---------+-----------+----------+--------------+    Summary: RIGHT: - There is no evidence of deep vein thrombosis in the lower extremity.  - No cystic structure found in the popliteal fossa.  LEFT: - No evidence of common femoral vein obstruction.  *See table(s) above for measurements and observations. Electronically signed by Lemar Livings MD on 03/23/2023 at 6:56:49 PM.    Final    NM Pulmonary Perfusion  Result Date: 03/23/2023 CLINICAL DATA:  Tachycardia, RIGHT leg swelling EXAM: NUCLEAR MEDICINE PERFUSION LUNG SCAN TECHNIQUE: Perfusion images were obtained in multiple projections after intravenous injection of radiopharmaceutical. RADIOPHARMACEUTICALS:  4.2 mCi Tc-42m MAA COMPARISON:  03/23/2023 FINDINGS: No wedge-shaped peripheral perfusion defect noted in the LEFT or RIGHT lung to suggest acute pulmonary embolism. Cardiac attenuation noted. IMPRESSION: No evidence acute pulmonary embolism. Electronically Signed   By: Genevive Bi M.D.   On: 03/23/2023 16:13   US RENAL  Result Date: 03/23/2023 CLINICAL DATA:  Acute kidney injury EXAM: RENAL / URINARY TRACT ULTRASOUND COMPLETE COMPARISON:  None Available. FINDINGS: Right Kidney: Renal measurements: 11.8 x 5.8 x 5.2 cm = volume: 183 mL. Echogenicity  within normal limits. No mass or hydronephrosis visualized. Left Kidney: Renal measurements: 12.0 x 6.7 x 6.3 cm = volume: 266 mL. Echogenicity within normal limits. No mass or hydronephrosis visualized. Bladder: Not well visualized due to underdistention. Other: None. IMPRESSION: No hydronephrosis. Electronically Signed   By: Allegra Lai M.D.   On: 03/23/2023 13:49   DG Chest Port 1 View  Result Date: 03/23/2023 CLINICAL DATA:  Shortness of breath EXAM: PORTABLE CHEST 1 VIEW COMPARISON:  08/14/2022 FINDINGS: Cardiomegaly. Diffuse bilateral interstitial pulmonary opacity. The visualized skeletal structures are unremarkable. IMPRESSION: Cardiomegaly with diffuse bilateral interstitial pulmonary opacity, consistent with edema or atypical/viral infection. No focal airspace opacity. Electronically Signed   By: Jearld Lesch M.D.   On: 03/23/2023 12:24   DG Foot Complete Right  Result Date: 03/22/2023 CLINICAL DATA:  Possible diabetic infection. EXAM: RIGHT FOOT COMPLETE - 3+ VIEW COMPARISON:  None Available. FINDINGS: Erosive changes of the distal phalanges of the first and second digits. In the absence of history of amputation findings suspicious for osteomyelitis. No acute fracture. There is no dislocation. The bones are osteopenic. Diffuse skin thickening and soft tissue edema. Dressing noted over the forefoot. IMPRESSION: Erosive changes of the distal phalanges of the first and second digits suspicious for osteomyelitis. Electronically Signed   By: Elgie Collard M.D.   On: 03/22/2023 20:11   - pertinent xrays, CT, MRI studies were reviewed and independently interpreted  Positive ROS: All other systems have been reviewed and were otherwise negative with the exception of those mentioned in the HPI and as above.  Physical Exam: General: Alert, no acute distress Psychiatric: Patient is competent for consent with normal mood and affect Lymphatic: No axillary or cervical  lymphadenopathy Cardiovascular: No pedal edema Respiratory: No cyanosis, no use of accessory musculature GI: No organomegaly, abdomen is soft and non-tender    Images:  @ENCIMAGES @  Labs:  Lab Results  Component Value Date   HGBA1C 9.8 (H) 03/23/2023  HGBA1C 9.7 (A) 01/19/2023   HGBA1C 11.9 (H) 08/14/2022   ESRSEDRATE >140 (H) 03/22/2023   CRP 30.8 (H) 03/22/2023   REPTSTATUS PENDING 03/22/2023   GRAMSTAIN  05/30/2019    MODERATE WBC PRESENT, PREDOMINANTLY PMN FEW GRAM POSITIVE COCCI IN PAIRS    CULT  03/22/2023    NO GROWTH < 12 HOURS Performed at [facility], [address], [place], [place] [address]    [facility] STAPHYLOCOCCUS AUREUS 05/30/2019    Lab Results  Component Value Date   ALBUMIN 1.6 (L) 03/24/2023   ALBUMIN 1.8 (L) 03/23/2023   ALBUMIN 2.1 (L) 03/22/2023        Latest Ref Rng & Units 03/24/2023    3:16 AM 03/23/2023    2:27 AM 03/23/2023   12:05 AM  CBC EXTENDED  WBC 4.0 - 10.5 K/uL 17.1  19.1  19.8   RBC 4.22 - 5.81 MIL/uL 3.09  3.02  3.15   Hemoglobin 13.0 - 17.0 g/dL 8.5  8.2  8.6   HCT 60.4 - 52.0 % 26.0  25.8  27.3   Platelets 150 - 400 K/uL 465  469  497   NEUT# 1.7 - 7.7 K/uL 14.3  17.0    Lymph# 0.7 - 4.0 K/uL 1.4  0.9      Neurologic: Patient does not have protective sensation bilateral lower extremities.   MUSCULOSKELETAL:   Skin: Examination patient has purulent drainage from the third toe right foot.  There is sausage digit swelling of the great toe second toe and third toe.  There is no ascending cellulitis.  The calf is soft nontender with lymphedema no ulcers.  Patient has a strong palpable dorsalis pedis pulse bilaterally.  No ulcers or cellulitis in the left foot.  White cell count has decreased to 17.1 from 19.1.  Hemoglobin stable at 8.5.  Absolute neutrophil lymphocyte count has improved as well.  Patient has albumin of 1.8.  A1c 11.9.  CRP 30.  Sed rate greater than 140.  Review of the radiographs shows  chronic destruction of the tuft of the right great toe with destructive changes of the second toe.  Ankle-brachial indices shows biphasic flow bilaterally with diminished right posterior tibial artery pressure with a normal right dorsalis pedis pressure.  Assessment: Assessment: Abscess and osteomyelitis right forefoot with uncontrolled type 2 diabetes, severe protein caloric malnutrition with lymphedema right lower extremity.  Plan: Plan: Will plan for transmetatarsal amputation on the right Wednesday.  Risk and benefits were discussed including potential for additional surgery.  Patient states he understands wishes to proceed at this time.  Thank you for the consult and the opportunity to see Mr. Taylor Vandruff, MD [facility] [phone] 7:13 AM

## 2023-03-25 ENCOUNTER — Encounter (HOSPITAL_COMMUNITY)
Admission: EM | Disposition: A | Discharge status: Home / Self Care | Payer: Self-pay | Source: Home / Self Care | Attending: Internal Medicine

## 2023-03-25 ENCOUNTER — Inpatient Hospital Stay (HOSPITAL_COMMUNITY): Payer: PRIVATE HEALTH INSURANCE

## 2023-03-25 ENCOUNTER — Encounter (HOSPITAL_COMMUNITY): Payer: Self-pay | Admitting: Family Medicine

## 2023-03-25 ENCOUNTER — Inpatient Hospital Stay (HOSPITAL_COMMUNITY): Payer: PRIVATE HEALTH INSURANCE | Admitting: Certified Registered Nurse Anesthetist

## 2023-03-25 ENCOUNTER — Other Ambulatory Visit: Payer: Self-pay

## 2023-03-25 DIAGNOSIS — I10 Essential (primary) hypertension: Secondary | ICD-10-CM

## 2023-03-25 DIAGNOSIS — L089 Local infection of the skin and subcutaneous tissue, unspecified: Secondary | ICD-10-CM | POA: Diagnosis not present

## 2023-03-25 DIAGNOSIS — L02611 Cutaneous abscess of right foot: Secondary | ICD-10-CM

## 2023-03-25 DIAGNOSIS — E11628 Type 2 diabetes mellitus with other skin complications: Secondary | ICD-10-CM | POA: Diagnosis not present

## 2023-03-25 DIAGNOSIS — E1169 Type 2 diabetes mellitus with other specified complication: Secondary | ICD-10-CM

## 2023-03-25 DIAGNOSIS — M869 Osteomyelitis, unspecified: Secondary | ICD-10-CM | POA: Diagnosis not present

## 2023-03-25 DIAGNOSIS — R000 Tachycardia, unspecified: Secondary | ICD-10-CM | POA: Diagnosis not present

## 2023-03-25 DIAGNOSIS — E43 Unspecified severe protein-calorie malnutrition: Secondary | ICD-10-CM | POA: Diagnosis not present

## 2023-03-25 HISTORY — PX: AMPUTATION: SHX166

## 2023-03-25 LAB — GLUCOSE, CAPILLARY
Glucose-Capillary: 169 mg/dL — ABNORMAL HIGH (ref 70–99)
Glucose-Capillary: 116 mg/dL — ABNORMAL HIGH (ref 70–99)
Glucose-Capillary: 119 mg/dL — ABNORMAL HIGH (ref 70–99)
Glucose-Capillary: 96 mg/dL (ref 70–99)
Glucose-Capillary: 117 mg/dL — ABNORMAL HIGH (ref 70–99)
Glucose-Capillary: 175 mg/dL — ABNORMAL HIGH (ref 70–99)

## 2023-03-25 LAB — MRSA NEXT GEN BY PCR, NASAL: MRSA by PCR Next Gen: NOT DETECTED

## 2023-03-25 LAB — BRAIN NATRIURETIC PEPTIDE: B Natriuretic Peptide: 524.5 pg/mL — ABNORMAL HIGH (ref 0.0–100.0)

## 2023-03-25 LAB — ABO/RH: ABO/RH(D): O POS

## 2023-03-25 LAB — TYPE AND SCREEN
ABO/RH(D): O POS
Antibody Screen: NEGATIVE

## 2023-03-25 LAB — C-REACTIVE PROTEIN: CRP: 27.2 mg/dL — ABNORMAL HIGH (ref <1.0)

## 2023-03-25 LAB — PROCALCITONIN: Procalcitonin: 0.62 ng/mL

## 2023-03-25 LAB — PROTIME-INR
INR: 1.2 (ref 0.8–1.2)
Prothrombin Time: 15.6 seconds — ABNORMAL HIGH (ref 11.4–15.2)

## 2023-03-25 SURGERY — AMPUTATION, FOOT, PARTIAL
Anesthesia: General | Site: Foot | Laterality: Right

## 2023-03-25 MED ORDER — LACTATED RINGERS IV SOLN: INTRAVENOUS | Status: AC

## 2023-03-25 MED ORDER — LABETALOL HCL 5 MG/ML IV SOLN
10.0000 mg | INTRAVENOUS | Status: DC | PRN
  Administered 2023-04-01: 10 mg via INTRAVENOUS
  Filled 2023-03-25: qty 4

## 2023-03-25 MED ORDER — SUCCINYLCHOLINE CHLORIDE 200 MG/10ML IV SOSY
PREFILLED_SYRINGE | INTRAVENOUS | Status: AC
  Filled 2023-03-25: qty 10

## 2023-03-25 MED ORDER — ATORVASTATIN CALCIUM 10 MG PO TABS
20.0000 mg | ORAL_TABLET | Freq: Every day | ORAL | Status: DC
  Administered 2023-03-25 – 2023-04-07 (×14): 20 mg via ORAL
  Filled 2023-03-25 (×14): qty 2

## 2023-03-25 MED ORDER — ROPIVACAINE HCL 5 MG/ML IJ SOLN
INTRAMUSCULAR | Status: DC | PRN
  Administered 2023-03-25: 20 mL via PERINEURAL
  Administered 2023-03-25: 30 mL via PERINEURAL

## 2023-03-25 MED ORDER — FENTANYL CITRATE (PF) 100 MCG/2ML IJ SOLN: 50.0000 ug | Freq: Once | INTRAMUSCULAR | Status: AC

## 2023-03-25 MED ORDER — POVIDONE-IODINE 10 % EX SWAB: 2.0000 | Freq: Once | CUTANEOUS | Status: DC

## 2023-03-25 MED ORDER — ALUM & MAG HYDROXIDE-SIMETH 200-200-20 MG/5ML PO SUSP
15.0000 mL | ORAL | Status: DC | PRN
  Filled 2023-03-25: qty 30

## 2023-03-25 MED ORDER — CEFAZOLIN IN SODIUM CHLORIDE 3-0.9 GM/100ML-% IV SOLN
3.0000 g | INTRAVENOUS | Status: AC
  Administered 2023-03-25: 3 g via INTRAVENOUS
  Filled 2023-03-25: qty 100

## 2023-03-25 MED ORDER — MIDAZOLAM HCL 2 MG/2ML IJ SOLN: 1.0000 mg | Freq: Once | INTRAMUSCULAR | Status: AC

## 2023-03-25 MED ORDER — PROPOFOL 10 MG/ML IV BOLUS
INTRAVENOUS | Status: DC | PRN
Start: 2023-03-25 — End: 2023-03-25
  Administered 2023-03-25: 150 mg via INTRAVENOUS

## 2023-03-25 MED ORDER — BISACODYL 5 MG PO TBEC: 5.0000 mg | DELAYED_RELEASE_TABLET | Freq: Every day | ORAL | Status: DC | PRN

## 2023-03-25 MED ORDER — PROPOFOL 10 MG/ML IV BOLUS
INTRAVENOUS | Status: AC
  Filled 2023-03-25: qty 20

## 2023-03-25 MED ORDER — LIDOCAINE 2% (20 MG/ML) 5 ML SYRINGE
INTRAMUSCULAR | Status: AC
  Filled 2023-03-25: qty 5

## 2023-03-25 MED ORDER — ONDANSETRON HCL 4 MG/2ML IJ SOLN
4.0000 mg | Freq: Four times a day (QID) | INTRAMUSCULAR | Status: DC | PRN
  Administered 2023-03-25: 4 mg via INTRAVENOUS
  Filled 2023-03-25: qty 2

## 2023-03-25 MED ORDER — METOPROLOL TARTRATE 25 MG PO TABS
37.5000 mg | ORAL_TABLET | Freq: Two times a day (BID) | ORAL | Status: DC
  Administered 2023-03-26 – 2023-03-27 (×4): 37.5 mg via ORAL
  Filled 2023-03-25 (×5): qty 1

## 2023-03-25 MED ORDER — PANTOPRAZOLE SODIUM 40 MG PO TBEC
40.0000 mg | DELAYED_RELEASE_TABLET | Freq: Every day | ORAL | Status: DC
  Administered 2023-03-25 – 2023-04-07 (×14): 40 mg via ORAL
  Filled 2023-03-25 (×14): qty 1

## 2023-03-25 MED ORDER — POTASSIUM CHLORIDE CRYS ER 20 MEQ PO TBCR: 20.0000 meq | EXTENDED_RELEASE_TABLET | Freq: Every day | ORAL | Status: DC | PRN

## 2023-03-25 MED ORDER — 0.9 % SODIUM CHLORIDE (POUR BTL) OPTIME
TOPICAL | Status: DC | PRN
  Administered 2023-03-25: 1000 mL

## 2023-03-25 MED ORDER — HYDRALAZINE HCL 20 MG/ML IJ SOLN: 5.0000 mg | INTRAMUSCULAR | Status: DC | PRN

## 2023-03-25 MED ORDER — POTASSIUM PHOSPHATES 15 MMOLE/5ML IV SOLN
15.0000 mmol | Freq: Once | INTRAVENOUS | Status: DC
  Filled 2023-03-25: qty 5

## 2023-03-25 MED ORDER — ACETAMINOPHEN 325 MG PO TABS: 325.0000 mg | ORAL_TABLET | Freq: Four times a day (QID) | ORAL | Status: DC | PRN

## 2023-03-25 MED ORDER — PHENOL 1.4 % MT LIQD: 1.0000 | OROMUCOSAL | Status: DC | PRN

## 2023-03-25 MED ORDER — CHLORHEXIDINE GLUCONATE 0.12 % MT SOLN
OROMUCOSAL | Status: AC
  Administered 2023-03-25: 15 mL via OROMUCOSAL
  Filled 2023-03-25: qty 15

## 2023-03-25 MED ORDER — ONDANSETRON HCL 4 MG/2ML IJ SOLN
INTRAMUSCULAR | Status: AC
  Filled 2023-03-25: qty 2

## 2023-03-25 MED ORDER — HYDROMORPHONE HCL 1 MG/ML IJ SOLN
0.5000 mg | INTRAMUSCULAR | Status: DC | PRN
  Administered 2023-03-26: 0.5 mg via INTRAVENOUS
  Filled 2023-03-25: qty 0.5

## 2023-03-25 MED ORDER — LIDOCAINE 2% (20 MG/ML) 5 ML SYRINGE
INTRAMUSCULAR | Status: DC | PRN
  Administered 2023-03-25: 80 mg via INTRAVENOUS

## 2023-03-25 MED ORDER — POLYETHYLENE GLYCOL 3350 17 G PO PACK: 17.0000 g | PACK | Freq: Every day | ORAL | Status: DC | PRN

## 2023-03-25 MED ORDER — VANCOMYCIN HCL 1000 MG IV SOLR
INTRAVENOUS | Status: DC | PRN
  Administered 2023-03-25: 1000 mg via TOPICAL

## 2023-03-25 MED ORDER — ONDANSETRON HCL 4 MG/2ML IJ SOLN
INTRAMUSCULAR | Status: DC | PRN
  Administered 2023-03-25: 80 mg via INTRAVENOUS
  Administered 2023-03-25: 4 mg via INTRAVENOUS

## 2023-03-25 MED ORDER — LACTATED RINGERS IV BOLUS
500.0000 mL | Freq: Once | INTRAVENOUS | Status: AC
  Administered 2023-03-25: 500 mL via INTRAVENOUS

## 2023-03-25 MED ORDER — LACTATED RINGERS IV SOLN: INTRAVENOUS | Status: DC

## 2023-03-25 MED ORDER — DOCUSATE SODIUM 100 MG PO CAPS
100.0000 mg | ORAL_CAPSULE | Freq: Every day | ORAL | Status: DC
  Filled 2023-03-25 (×2): qty 1

## 2023-03-25 MED ORDER — JUVEN PO PACK
1.0000 | PACK | Freq: Two times a day (BID) | ORAL | Status: DC
  Administered 2023-03-25 – 2023-04-02 (×3): 1 via ORAL
  Filled 2023-03-25 (×10): qty 1

## 2023-03-25 MED ORDER — MAGNESIUM SULFATE 2 GM/50ML IV SOLN: 2.0000 g | Freq: Every day | INTRAVENOUS | Status: DC | PRN

## 2023-03-25 MED ORDER — ORAL CARE MOUTH RINSE: 15.0000 mL | Freq: Once | OROMUCOSAL | Status: AC

## 2023-03-25 MED ORDER — METOPROLOL TARTRATE 25 MG PO TABS
25.0000 mg | ORAL_TABLET | Freq: Two times a day (BID) | ORAL | Status: DC
  Administered 2023-03-25: 25 mg via ORAL
  Filled 2023-03-25: qty 1

## 2023-03-25 MED ORDER — METOPROLOL TARTRATE 5 MG/5ML IV SOLN: 2.0000 mg | INTRAVENOUS | Status: DC | PRN

## 2023-03-25 MED ORDER — CHLORHEXIDINE GLUCONATE 0.12 % MT SOLN: 15.0000 mL | Freq: Once | OROMUCOSAL | Status: AC

## 2023-03-25 MED ORDER — VANCOMYCIN HCL 1000 MG IV SOLR
INTRAVENOUS | Status: AC
  Filled 2023-03-25: qty 20

## 2023-03-25 MED ORDER — METOPROLOL TARTRATE 12.5 MG HALF TABLET
12.5000 mg | ORAL_TABLET | Freq: Once | ORAL | Status: AC
  Administered 2023-03-25: 12.5 mg via ORAL
  Filled 2023-03-25: qty 1

## 2023-03-25 MED ORDER — INSULIN GLARGINE-YFGN 100 UNIT/ML ~~LOC~~ SOLN
20.0000 [IU] | Freq: Every day | SUBCUTANEOUS | Status: DC
  Administered 2023-03-25 – 2023-03-26 (×2): 20 [IU] via SUBCUTANEOUS
  Filled 2023-03-25 (×3): qty 0.2

## 2023-03-25 MED ORDER — MIDAZOLAM HCL 2 MG/2ML IJ SOLN
INTRAMUSCULAR | Status: AC
  Administered 2023-03-25: 1 mg via INTRAVENOUS
  Filled 2023-03-25: qty 2

## 2023-03-25 MED ORDER — INSULIN ASPART 100 UNIT/ML IJ SOLN: 0.0000 [IU] | INTRAMUSCULAR | Status: DC | PRN

## 2023-03-25 MED ORDER — SUCCINYLCHOLINE CHLORIDE 200 MG/10ML IV SOSY
PREFILLED_SYRINGE | INTRAVENOUS | Status: DC | PRN
  Administered 2023-03-25: 120 mg via INTRAVENOUS

## 2023-03-25 MED ORDER — FENTANYL CITRATE (PF) 100 MCG/2ML IJ SOLN
INTRAMUSCULAR | Status: AC
  Administered 2023-03-25: 50 ug via INTRAVENOUS
  Filled 2023-03-25: qty 2

## 2023-03-25 MED ORDER — MIDODRINE HCL 5 MG PO TABS
10.0000 mg | ORAL_TABLET | Freq: Three times a day (TID) | ORAL | Status: DC
  Administered 2023-03-25 – 2023-03-27 (×5): 10 mg via ORAL
  Filled 2023-03-25 (×5): qty 2

## 2023-03-25 MED ORDER — VITAMIN C 500 MG PO TABS
1000.0000 mg | ORAL_TABLET | Freq: Every day | ORAL | Status: DC
  Administered 2023-03-25 – 2023-04-07 (×14): 1000 mg via ORAL
  Filled 2023-03-25 (×14): qty 2

## 2023-03-25 MED ORDER — MAGNESIUM CITRATE PO SOLN: 1.0000 | Freq: Once | ORAL | Status: DC | PRN

## 2023-03-25 MED ORDER — DILTIAZEM HCL 25 MG/5ML IV SOLN
10.0000 mg | Freq: Once | INTRAVENOUS | Status: AC
  Administered 2023-03-25: 10 mg via INTRAVENOUS
  Filled 2023-03-25: qty 5

## 2023-03-25 MED ORDER — ASPIRIN 81 MG PO TBEC
81.0000 mg | DELAYED_RELEASE_TABLET | Freq: Every day | ORAL | Status: DC
  Administered 2023-03-25 – 2023-04-07 (×14): 81 mg via ORAL
  Filled 2023-03-25 (×14): qty 1

## 2023-03-25 MED ORDER — VASHE WOUND IRRIGATION OPTIME
TOPICAL | Status: DC | PRN
  Administered 2023-03-25: 34 [oz_av]

## 2023-03-25 MED ORDER — CHLORHEXIDINE GLUCONATE 4 % EX SOLN
60.0000 mL | Freq: Once | CUTANEOUS | Status: AC
  Administered 2023-03-25: 4 via TOPICAL
  Filled 2023-03-25: qty 60

## 2023-03-25 MED ORDER — ZINC SULFATE 220 (50 ZN) MG PO CAPS
220.0000 mg | ORAL_CAPSULE | Freq: Every day | ORAL | Status: AC
  Administered 2023-03-25 – 2023-04-07 (×14): 220 mg via ORAL
  Filled 2023-03-25 (×14): qty 1

## 2023-03-25 MED ORDER — SODIUM CHLORIDE 0.9 % IV SOLN: INTRAVENOUS | Status: DC

## 2023-03-25 MED ORDER — GUAIFENESIN-DM 100-10 MG/5ML PO SYRP: 15.0000 mL | ORAL_SOLUTION | ORAL | Status: DC | PRN

## 2023-03-25 MED ORDER — OXYCODONE HCL 5 MG PO TABS
5.0000 mg | ORAL_TABLET | ORAL | Status: DC | PRN
  Administered 2023-03-27 – 2023-03-30 (×9): 10 mg via ORAL
  Administered 2023-03-30 (×2): 5 mg via ORAL
  Administered 2023-03-31 – 2023-04-06 (×20): 10 mg via ORAL
  Administered 2023-04-07: 5 mg via ORAL
  Filled 2023-03-25 (×28): qty 2
  Filled 2023-03-25: qty 1
  Filled 2023-03-25 (×3): qty 2
  Filled 2023-03-25: qty 1
  Filled 2023-03-25: qty 2

## 2023-03-25 MED ORDER — OXYCODONE HCL 5 MG PO TABS
10.0000 mg | ORAL_TABLET | ORAL | Status: DC | PRN
  Administered 2023-03-25: 10 mg via ORAL
  Administered 2023-03-26 – 2023-03-27 (×4): 15 mg via ORAL
  Filled 2023-03-25 (×4): qty 3
  Filled 2023-03-25: qty 2

## 2023-03-25 SURGICAL SUPPLY — 39 items
APL SKNCLS STERI-STRIP NONHPOA (GAUZE/BANDAGES/DRESSINGS) ×1
BAG COUNTER SPONGE SURGICOUNT (BAG) ×1 IMPLANT
BAG SPNG CNTER NS LX DISP (BAG) ×1
BENZOIN TINCTURE PRP APPL 2/3 (GAUZE/BANDAGES/DRESSINGS) ×1 IMPLANT
BLADE SAW SGTL HD 18.5X60.5X1. (BLADE) ×1 IMPLANT
BLADE SURG 21 STRL SS (BLADE) ×1 IMPLANT
BNDG GAUZE DERMACEA FLUFF 4 (GAUZE/BANDAGES/DRESSINGS) IMPLANT
BNDG GZE DERMACEA 4 6PLY (GAUZE/BANDAGES/DRESSINGS)
CANISTER WOUND CARE 500ML ATS (WOUND CARE) IMPLANT
CANISTER WOUNDNEG PRESSURE 500 (CANNISTER) IMPLANT
CLEANSER WND VASHE INSTL 34OZ (WOUND CARE) IMPLANT
COVER SURGICAL LIGHT HANDLE (MISCELLANEOUS) ×1 IMPLANT
DRAPE DERMATAC (DRAPES) IMPLANT
DRAPE INCISE IOBAN 66X45 STRL (DRAPES) ×1 IMPLANT
DRAPE U-SHAPE 47X51 STRL (DRAPES) ×1 IMPLANT
DRSG ADAPTIC 3X8 NADH LF (GAUZE/BANDAGES/DRESSINGS) IMPLANT
DURAPREP 26ML APPLICATOR (WOUND CARE) ×1 IMPLANT
ELECT REM PT RETURN 9FT ADLT (ELECTROSURGICAL) ×1
ELECTRODE REM PT RTRN 9FT ADLT (ELECTROSURGICAL) ×1 IMPLANT
GAUZE PAD ABD 8X10 STRL (GAUZE/BANDAGES/DRESSINGS) IMPLANT
GAUZE SPONGE 4X4 12PLY STRL (GAUZE/BANDAGES/DRESSINGS) IMPLANT
GLOVE BIOGEL PI IND STRL 9 (GLOVE) ×1 IMPLANT
GLOVE SURG ORTHO 9.0 STRL STRW (GLOVE) ×1 IMPLANT
GOWN STRL REUS W/ TWL XL LVL3 (GOWN DISPOSABLE) ×3 IMPLANT
GOWN STRL REUS W/TWL XL LVL3 (GOWN DISPOSABLE) ×3
GRAFT SKIN WND MICRO 38 (Tissue) IMPLANT
KIT BASIN OR (CUSTOM PROCEDURE TRAY) ×1 IMPLANT
KIT PREVENA INCISION MGT20CM45 (CANNISTER) IMPLANT
KIT TURNOVER KIT B (KITS) ×1 IMPLANT
NS IRRIG 1000ML POUR BTL (IV SOLUTION) ×1 IMPLANT
PACK ORTHO EXTREMITY (CUSTOM PROCEDURE TRAY) ×1 IMPLANT
PAD ARMBOARD 7.5X6 YLW CONV (MISCELLANEOUS) ×2 IMPLANT
SPONGE T-LAP 18X18 ~~LOC~~+RFID (SPONGE) IMPLANT
SUT ETHILON 2 0 PSLX (SUTURE) ×2 IMPLANT
TOWEL GREEN STERILE (TOWEL DISPOSABLE) ×1 IMPLANT
TOWEL GREEN STERILE FF (TOWEL DISPOSABLE) ×1 IMPLANT
TUBE CONNECTING 12X1/4 (SUCTIONS) ×1 IMPLANT
WATER STERILE IRR 1000ML POUR (IV SOLUTION) ×1 IMPLANT
YANKAUER SUCT BULB TIP NO VENT (SUCTIONS) ×1 IMPLANT

## 2023-03-25 NOTE — Transfer of Care (Signed)
Immediate Anesthesia Transfer of Care Note  Patient: Taylor Hughes  Procedure(s) Performed: RIGHT TRANSMETATARSAL AMPUTATION (Right: Foot)  Patient Location: PACU  Anesthesia Type:GA combined with regional for post-op pain  Level of Consciousness: awake, alert , and oriented  Airway & Oxygen Therapy: Patient Spontanous Breathing and Patient connected to nasal cannula oxygen  Post-op Assessment: Report given to RN, Post -op Vital signs reviewed and stable, Patient moving all extremities X 4, and Patient able to stick tongue midline  Post vital signs: Reviewed and stable  Last Vitals:  Vitals Value Taken Time  BP 116/83 03/25/23 1301  Temp 98.9   Pulse 81 03/25/23 1303  Resp 17 03/25/23 1303  SpO2 94 % 03/25/23 1303  Vitals shown include unfiled device data.  Last Pain:  Vitals:   03/25/23 1039  TempSrc:   PainSc: 0-No pain         Complications: No notable events documented.

## 2023-03-25 NOTE — Progress Notes (Signed)
Orthopedic Tech Progress Note Patient Details:  Taylor Hughes 12-May-1971 [id number]  Ortho Devices Type of Ortho Device: Postop shoe/boot Ortho Device/Splint Location: RLE Ortho Device/Splint Interventions: Ordered, Application, Adjustment, Removal   Post Interventions Patient Tolerated: Well Instructions Provided: Care of device  Donald Pore 03/25/2023, 5:19 PM

## 2023-03-25 NOTE — Interval H&P Note (Signed)
History and Physical Interval Note:  03/25/2023 7:16 AM  Taylor Hughes  has presented today for surgery, with the diagnosis of Osteomyelitis, Abscess Right Foot.  The various methods of treatment have been discussed with the patient and family. After consideration of risks, benefits and other options for treatment, the patient has consented to  Procedure(s): RIGHT TRANSMETATARSAL AMPUTATION (Right) as a surgical intervention.  The patient's history has been reviewed, patient examined, no change in status, stable for surgery.  I have reviewed the patient's chart and labs.  Questions were answered to the patient's satisfaction.     Nadara Mustard

## 2023-03-25 NOTE — Progress Notes (Signed)
PT Cancellation Note  Patient Details Name: Taylor Hughes MRN: [id number] DOB: 1970-09-19   Cancelled Treatment:    Reason Eval/Treat Not Completed: Patient at procedure or test/unavailable  Patient in surgery. Will follow-up post-op as appropriate.    Jerolyn Center, PT Acute Rehabilitation Services  Office [phone]   Zena Amos 03/25/2023, 10:50 AM

## 2023-03-25 NOTE — Plan of Care (Signed)
  Problem: Skin Integrity: Goal: Risk for impaired skin integrity will decrease Outcome: Progressing   Problem: Coping: Goal: Level of anxiety will decrease Outcome: Progressing   Problem: Pain Managment: Goal: General experience of comfort will improve Outcome: Progressing

## 2023-03-25 NOTE — Op Note (Signed)
03/25/2023  1:36 PM  PATIENT:  Taylor Hughes    PRE-OPERATIVE DIAGNOSIS:  Osteomyelitis, Abscess Right Foot  POST-OPERATIVE DIAGNOSIS:  Same  PROCEDURE:  RIGHT TRANSMETATARSAL AMPUTATION Tissue sent for cultures. Application of vancomycin powder 1 g. Application Kerecis micro graft 38 cm. Application 13 cm Prevena wound VAC.   SURGEON:  Nadara Mustard, MD  PHYSICIAN ASSISTANT:None ANESTHESIA:   General  PREOPERATIVE INDICATIONS:  Taylor Hughes is a  52 y.o. male with a diagnosis of Osteomyelitis, Abscess Right Foot who failed conservative measures and elected for surgical management.    The risks benefits and alternatives were discussed with the patient preoperatively including but not limited to the risks of infection, bleeding, nerve injury, cardiopulmonary complications, the need for revision surgery, among others, and the patient was willing to proceed.  OPERATIVE IMPLANTS:   Implant Name Type Inv. Item Serial No. Manufacturer Lot No. LRB No. Used Action  GRAFT SKIN WND MICRO 38 - [id number] Tissue GRAFT SKIN WND MICRO 38  [employer] [id number] Right 1 Implanted    @ENCIMAGES @  OPERATIVE FINDINGS: Purulence extended proximal to the surgical margins.  The involved and infected tissue was resected.  Infected tissue was sent for cultures.  OPERATIVE PROCEDURE: Patient was brought the operating room and underwent a general anesthetic.  After adequate levels anesthesia were obtained patient's right lower extremity was prepped using DuraPrep draped into a sterile field a timeout was called.  The toes were wrapped out of the sterile field with impervious Ioban.  A fishmouth incision was made proximal to the necrotic tissue.  Purulent abscess extended proximal to the surgical margins plantarly.  A oscillating saw was used to perform a transmetatarsal amputation.  A 21 blade knife was used to excise more muscle fascia and soft tissue from the plantar aspect that was involved with the  abscess.  This infected tissue was sent for cultures.  Tissue margins were grossly clear after further debridement.  The wound was irrigated with normal saline and Vashe irrigation.  Electrocautery was used hemostasis.  1 g vancomycin powder was placed deep within the wound and this was covered with 38 cm of Kerecis micro graft.  The incision was closed using 2-0 nylon a 13 cm Prevena wound VAC was applied this had a good suction fit patient was extubated taken the PACU in stable condition   DISCHARGE PLANNING:  Antibiotic duration: Continue current antibiotics and would adjust according to tissue cultures.  Anticipate ideally patient could be discharged on 3 weeks of oral antibiotics.  Weightbearing: Nonweightbearing on the right  Pain medication: Opioid pathway  Dressing care/ Wound VAC: Continue wound VAC for 1 week.  Ambulatory devices: Walker  Discharge to: Discharge planning based on therapy recommendations.  Follow-up: In the office 1 week post operative.

## 2023-03-25 NOTE — Anesthesia Procedure Notes (Signed)
Anesthesia Regional Block: Adductor canal block   Pre-Anesthetic Checklist: , timeout performed,  Correct Patient, Correct Site, Correct Laterality,  Correct Procedure, Correct Position, site marked,  Risks and benefits discussed,  Surgical consent,  Pre-op evaluation,  At surgeon's request and post-op pain management  Laterality: Right  Prep: chloraprep       Needles:  Injection technique: Single-shot  Needle Type: Echogenic Stimulator Needle     Needle Length: 10cm  Needle Gauge: 21   Needle insertion depth: 8 cm   Additional Needles:   Procedures:,,,, ultrasound used (permanent image in chart),,    Narrative:  Start time: 03/25/2023 11:31 AM End time: 03/25/2023 11:36 AM Injection made incrementally with aspirations every 5 mL.  Performed by: Personally  Anesthesiologist: Mal Amabile, MD  Additional Notes: Timeout performed. Patient sedated. Relevant anatomy ID'd using [place]. Incremental 2-18ml injection of LA with frequent aspiration. Patient tolerated procedure well.

## 2023-03-25 NOTE — Anesthesia Postprocedure Evaluation (Signed)
Anesthesia Post Note  Patient: Taylor Hughes  Procedure(s) Performed: RIGHT TRANSMETATARSAL AMPUTATION (Right: Foot)     Patient location during evaluation: PACU Anesthesia Type: General Level of consciousness: awake and alert and oriented Pain management: pain level controlled Vital Signs Assessment: post-procedure vital signs reviewed and stable Respiratory status: spontaneous breathing, nonlabored ventilation and respiratory function stable Cardiovascular status: blood pressure returned to baseline and stable Postop Assessment: no apparent nausea or vomiting Anesthetic complications: no   No notable events documented.  Last Vitals:  Vitals:   03/25/23 1315 03/25/23 1330  BP: 129/80 125/82  Pulse: 81 79  Resp: 14 (!) 22  Temp:    SpO2: 94% 94%    Last Pain:  Vitals:   03/25/23 1330  TempSrc:   PainSc: 0-No pain                 Byard Carranza A.

## 2023-03-25 NOTE — Plan of Care (Signed)
  Problem: Coping: Goal: Ability to adjust to condition or change in health will improve Outcome: Progressing   Problem: Metabolic: Goal: Ability to maintain appropriate glucose levels will improve Outcome: Progressing   Problem: Skin Integrity: Goal: Risk for impaired skin integrity will decrease Outcome: Progressing   Problem: Coping: Goal: Level of anxiety will decrease 03/25/2023 0700 by Reginia Naas, RN Outcome: Progressing 03/25/2023 0700 by Reginia Naas, RN Outcome: Progressing   Problem: Pain Managment: Goal: General experience of comfort will improve 03/25/2023 0700 by Reginia Naas, RN Outcome: Progressing 03/25/2023 0700 by Reginia Naas, RN Outcome: Progressing

## 2023-03-25 NOTE — Anesthesia Procedure Notes (Signed)
Anesthesia Regional Block: Popliteal block   Pre-Anesthetic Checklist: , timeout performed,  Correct Patient, Correct Site, Correct Laterality,  Correct Procedure, Correct Position, site marked,  Risks and benefits discussed,  Surgical consent,  Pre-op evaluation,  At surgeon's request and post-op pain management  Laterality: Right  Prep: chloraprep       Needles:  Injection technique: Single-shot  Needle Type: Echogenic Stimulator Needle     Needle Length: 10cm  Needle Gauge: 21   Needle insertion depth: 8 cm   Additional Needles:   Procedures:,,,, ultrasound used (permanent image in chart),,    Narrative:  Start time: 03/25/2023 11:26 AM End time: 03/25/2023 11:31 AM Injection made incrementally with aspirations every 5 mL.  Performed by: Personally  Anesthesiologist: Mal Amabile, MD  Additional Notes: Timeout performed. Patient sedated. Relevant anatomy ID'd using [place]. Incremental 2-26ml injection of LA with frequent aspiration. Patient tolerated procedure well.

## 2023-03-25 NOTE — Progress Notes (Signed)
Sent request to [employer] to assist patient in applying for Disability.   Update: Per [employer], patient does not have a qualifying diagnosis for Disability.   Joaquin Courts, MSW, [facility]

## 2023-03-25 NOTE — Progress Notes (Signed)
PROGRESS NOTE                                                                                                                                                                                                             Patient Demographics:    Taylor Hughes, is a 52 y.o. male, DOB - [birth year], NIO:[id number]  Outpatient Primary MD for the patient is Hoy Register, MD    LOS - 2  Admit date - 03/22/2023    Chief Complaint  Patient presents with   Wound Infection   Hyperglycemia       Brief Narrative (HPI from H&P)   52 year old male with past medical history of T2DM, peripheral neuropathy, atrial fibrillation, HTN, and GERD. He presents with infection of his right lower extremity . Admitted with diabetic foot infection/osteo.  He was seen by orthopedics and cardiology, transferred to my service on 03/22/2023.   Subjective:    [name] today has, No headache, No chest pain, No abdominal pain - No Nausea, No new weakness tingling or numbness, no SOB   Assessment  & Plan :    Sepsis due to right diabetic foot ulcer present on admission.  Seen by orthopedics, currently on empiric IV antibiotics, sepsis pathophysiology improving although not completely resolved, follow cultures which are thus far negative, continue empiric IV antibiotics, ABI suggest mild right lower extremity arterial disease, Ortho planning transmetatarsal amputation on 03/25/2023.  Patient agreeable to proceed, agreeable for blood transfusion if needed.   Intermittent SVT and sinus tachycardia.  Due to #1 above.  Echocardiogram noted with preserved left sided function.  Review was dilated hence he has undergone VQ scan along with CTA ruling out PE, stable TSH, cardiology on board, currently being hydrated, sepsis being treated, low-dose beta-blocker as tolerated by blood pressure.  Likely once definitive treatment of his infection will be done his heart rate  will gradually improve.     Right lower extremity edema.  Present for the last month secondary to the infection, lower extremity venous duplex although limited but negative.  Essential hypertension.  Holding home dose Lasix and Diovan.  AKI.  Due to sepsis.  Hydrate and monitor hold nephrotoxins.  Dyslipidemia.  On statin.  Vomiting due to gastroparesis.  Much improved with supportive care.  Morbid obesity.  BMI 41.  Follow-up with PCP for weight  loss.  DM type II.  On Lantus and SSI.  Monitor. Lab Results  Component Value Date   HGBA1C 9.8 (H) 03/23/2023   CBG (last 3)  Recent Labs    03/24/23 2354 03/25/23 0411 03/25/23 0755  GLUCAP 192* 169* 116*         Condition - Fair  Family Communication  :  None  Code Status :  Full  Consults  :  Cards, Ortho  PUD Prophylaxis : PPI   Procedures  :            Disposition Plan  :    Status is: Inpatient   DVT Prophylaxis  :    heparin injection 5,000 Units Start: 03/23/23 1800    Lab Results  Component Value Date   PLT 461 (H) 03/25/2023    Diet :  Diet Order             Diet NPO time specified Except for: Sips with Meds  Diet effective ____                    Inpatient Medications  Scheduled Meds:  buPROPion  150 mg Oral Daily   chlorhexidine  15 mL Mouth/Throat Once   Or   mouth rinse  15 mL Mouth Rinse Once   gabapentin  300 mg Oral BID   heparin injection (subcutaneous)  5,000 Units Subcutaneous Q8H   insulin aspart  0-15 Units Subcutaneous Q4H   insulin glargine-yfgn  30 Units Subcutaneous QHS   metoCLOPramide (REGLAN) injection  10 mg Intravenous Q6H   metoprolol tartrate  37.5 mg Oral BID   metroNIDAZOLE  500 mg Oral Q12H   midodrine  10 mg Oral TID WC   mupirocin ointment  1 Application Nasal BID   pantoprazole (PROTONIX) IV  40 mg Intravenous Q24H   povidone-iodine  2 Application Topical Once   Continuous Infusions:   ceFAZolin (ANCEF) IV     ceFEPime (MAXIPIME) IV 2 g  (03/25/23 0526)   lactated ringers 100 mL/hr at 03/25/23 0945   lactated ringers     potassium PHOSPHATE IVPB (in mmol)     promethazine (PHENERGAN) injection (IM or IVPB)     vancomycin 1,250 mg (03/24/23 2327)   PRN Meds:.acetaminophen **OR** acetaminophen, metoprolol tartrate, morphine injection, promethazine **OR** promethazine (PHENERGAN) injection (IM or IVPB) **OR** promethazine, senna-docusate, simethicone  Antibiotics  :    Anti-infectives (From admission, onward)    Start     Dose/Rate Route Frequency Ordered Stop   03/25/23 0800  ceFAZolin (ANCEF) IVPB 3g/100 mL premix        3 g 200 mL/hr over 30 Minutes Intravenous On call to O.R. 03/25/23 0145 03/26/23 0559   03/23/23 1000  vancomycin (VANCOREADY) IVPB 1250 mg/250 mL        1,250 mg 166.7 mL/hr over 90 Minutes Intravenous Every 12 hours 03/22/23 2308     03/23/23 1000  metroNIDAZOLE (FLAGYL) tablet 500 mg        500 mg Oral Every 12 hours 03/23/23 0906     03/23/23 0600  ceFEPIme (MAXIPIME) 2 g in sodium chloride 0.9 % 100 mL IVPB        2 g 200 mL/hr over 30 Minutes Intravenous Every 8 hours 03/22/23 2308     03/22/23 2200  vancomycin (VANCOREADY) IVPB 1750 mg/350 mL        1,750 mg 175 mL/hr over 120 Minutes Intravenous  Once 03/22/23 2146 03/23/23 0203   03/22/23 2200  ceFEPIme (MAXIPIME) 2 g in sodium chloride 0.9 % 100 mL IVPB        2 g 200 mL/hr over 30 Minutes Intravenous  Once 03/22/23 2146 03/22/23 2234         Objective:   Vitals:   03/25/23 0200 03/25/23 0400 03/25/23 0414 03/25/23 0753  BP:  105/61 105/61 110/60  Pulse: (!) 125 (!) 127 (!) 127   Resp: (!) 27 (!) 30 18 17   Temp:  100.1 F (37.8 C) 100.1 F (37.8 C) 98.6 F (37 C)  TempSrc:  Oral Oral Oral  SpO2: 95% 93% 95%   Weight:      Height:        Wt Readings from Last 3 Encounters:  03/23/23 136 kg  01/19/23 135.9 kg  09/08/22 133.1 kg     Intake/Output Summary (Last 24 hours) at 03/25/2023 1006 Last data filed at 03/25/2023  0600 Gross per 24 hour  Intake 480 ml  Output 800 ml  Net -320 ml     Physical Exam  Awake Alert, No new F.N deficits, Normal affect [place].AT,PERRAL Supple Neck, No JVD,   Symmetrical Chest wall movement, Good air movement bilaterally, CTAB RRR,No Gallops,Rubs or new Murmurs,  +ve B.Sounds, Abd Soft, No tenderness,   No Cyanosis, Clubbing or edema     RN pressure injury documentation:      Data Review:    Recent Labs  Lab 03/22/23 2023 03/23/23 0005 03/23/23 0227 03/24/23 0316 03/25/23 0109  WBC 17.9* 19.8* 19.1* 17.1* 15.5*  HGB 9.3* 8.6* 8.2* 8.5* 7.7*  HCT 29.9* 27.3* 25.8* 26.0* 23.6*  PLT 539* 497* 469* 465* 461*  MCV 87.9 86.7 85.4 84.1 83.4  MCH 27.4 27.3 27.2 27.5 27.2  MCHC 31.1 31.5 31.8 32.7 32.6  RDW 15.1 15.2 14.9 14.6 14.6  LYMPHSABS 1.0  --  0.9 1.4  --   MONOABS 0.9  --  1.0 1.3*  --   EOSABS 0.1  --  0.0 0.0  --   BASOSABS 0.0  --  0.0 0.0  --     Recent Labs  Lab 03/22/23 2023 03/22/23 2151 03/23/23 0005 03/23/23 0227 03/23/23 1859 03/24/23 0316 03/25/23 0109 03/25/23 0819  NA 130*  --   --  132*  --  137 134*  --   K 5.0  --   --  4.3  --  3.6 3.8  --   CL 99  --   --  102  --  106 102  --   CO2 11*  --   --  12*  --  22 24  --   ANIONGAP 20*  --   --  18*  --  9 8  --   GLUCOSE 405*  --   --  410*  --  126* 205*  --   BUN 26*  --   --  28*  --  21* 19  --   CREATININE 1.52*  --   --  1.70*  --  1.41* 1.27*  --   AST 14*  --   --  11*  --  14*  --   --   ALT 15  --   --  13  --  14  --   --   ALKPHOS 64  --   --  56  --  50  --   --   BILITOT 1.2  --   --  1.1  --  0.4  --   --  ALBUMIN 2.1*  --   --  1.8*  --  1.6*  --   --   CRP  --  30.8*  --   --   --   --   --  27.2*  PROCALCITON  --   --   --   --   --   --   --  0.62  LATICACIDVEN  --  1.3 1.3 1.2  --   --   --   --   INR  --   --   --   --   --   --   --  1.2  TSH  --   --   --   --   --  1.058  --   --   HGBA1C  --   --  9.8*  --   --   --   --   --   BNP  --   --    --   --  462.8*  --   --  524.5*  MG  --   --   --  1.9  --  1.7  --   --   CALCIUM 8.7*  --   --  8.7*  --  8.2* 7.8*  --       Recent Labs  Lab 03/22/23 2023 03/22/23 2151 03/23/23 0005 03/23/23 0227 03/23/23 1859 03/24/23 0316 03/25/23 0109 03/25/23 0819  CRP  --  30.8*  --   --   --   --   --  27.2*  PROCALCITON  --   --   --   --   --   --   --  0.62  LATICACIDVEN  --  1.3 1.3 1.2  --   --   --   --   INR  --   --   --   --   --   --   --  1.2  TSH  --   --   --   --   --  1.058  --   --   HGBA1C  --   --  9.8*  --   --   --   --   --   BNP  --   --   --   --  462.8*  --   --  524.5*  MG  --   --   --  1.9  --  1.7  --   --   CALCIUM 8.7*  --   --  8.7*  --  8.2* 7.8*  --     Micro Results Recent Results (from the past 240 hour(s))  Blood culture (routine x 2)     Status: None (Preliminary result)   Collection Time: 03/22/23  8:23 PM   Specimen: BLOOD RIGHT HAND  Result Value Ref Range Status   Specimen Description BLOOD RIGHT HAND  Final   Special Requests   Final    BOTTLES DRAWN AEROBIC AND ANAEROBIC Blood Culture adequate volume   Culture   Final    NO GROWTH 3 DAYS Performed at [facility], [address] [place], [place] [address]    Report Status PENDING  Incomplete  Blood culture (routine x 2)     Status: None (Preliminary result)   Collection Time: 03/22/23  9:09 PM   Specimen: BLOOD  Result Value Ref Range Status   Specimen Description BLOOD SITE NOT SPECIFIED  Final   Special Requests  Final    BOTTLES DRAWN AEROBIC ONLY Blood Culture adequate volume   Culture   Final    NO GROWTH 3 DAYS Performed at [facility], [address] [place], [place] [address]    Report Status PENDING  Incomplete  SARS Coronavirus 2 by RT PCR (hospital order, performed in [facility] hospital lab) *cepheid single result test* Anterior Nasal Swab     Status: None   Collection Time: 03/23/23  5:20 PM   Specimen: Anterior Nasal Swab  Result Value Ref  Range Status   SARS Coronavirus 2 by RT PCR NEGATIVE NEGATIVE Final    Comment: Performed at [facility], [address] [place], [place] [address]  Surgical PCR screen     Status: Abnormal   Collection Time: 03/24/23  5:30 PM   Specimen: Nasal Mucosa; Nasal Swab  Result Value Ref Range Status   MRSA, PCR NEGATIVE NEGATIVE Final   Staphylococcus aureus POSITIVE (A) NEGATIVE Final    Comment: (NOTE) The Xpert SA Assay (FDA approved for NASAL specimens in patients 63 years of age and older), is one component of a comprehensive surveillance program. It is not intended to diagnose infection nor to guide or monitor treatment. Performed at [facility], [address], [place], [place] [address]   MRSA Next Gen by PCR, Nasal     Status: None   Collection Time: 03/25/23  6:40 AM   Specimen: Nasal Mucosa; Nasal Swab  Result Value Ref Range Status   MRSA by PCR Next Gen NOT DETECTED NOT DETECTED Final    Comment: (NOTE) The GeneXpert MRSA Assay (FDA approved for NASAL specimens only), is one component of a comprehensive MRSA colonization surveillance program. It is not intended to diagnose MRSA infection nor to guide or monitor treatment for MRSA infections. Test performance is not FDA approved in patients less than 33 years old. Performed at [facility], [address], [place], [place] [address]     Radiology Reports DG Chest Vincent 1 View  Result Date: 03/25/2023 CLINICAL DATA:  Shortness of breath. EXAM: PORTABLE CHEST 1 VIEW COMPARISON:  03/23/2023 FINDINGS: Low volume film. Cardiopericardial silhouette is at upper limits of normal for size. There is pulmonary vascular congestion. Persistent diffuse interstitial prominence may be chronic but component of interstitial edema is not excluded. No dense focal airspace consolidation or evidence of pleural effusion. IMPRESSION: Low volume film with pulmonary vascular congestion and diffuse interstitial prominence.  Interstitial edema not excluded. Electronically Signed   By: Kennith Center M.D.   On: 03/25/2023 06:14   CT Angio Chest Pulmonary Embolism (PE) W or WO Contrast  Result Date: 03/24/2023 CLINICAL DATA:  Concern for pulmonary embolism. EXAM: CT ANGIOGRAPHY CHEST WITH CONTRAST TECHNIQUE: Multidetector CT imaging of the chest was performed using the standard protocol during bolus administration of intravenous contrast. Multiplanar CT image reconstructions and MIPs were obtained to evaluate the vascular anatomy. RADIATION DOSE REDUCTION: This exam was performed according to the departmental dose-optimization program which includes automated exposure control, adjustment of the mA and/or kV according to patient size and/or use of iterative reconstruction technique. CONTRAST:  75mL OMNIPAQUE IOHEXOL 350 MG/ML SOLN COMPARISON:  Chest CT dated 05/23/2019. FINDINGS: Cardiovascular: There is no cardiomegaly or pericardial effusion. There is coronary vascular calcification. The thoracic aorta is unremarkable. Evaluation of the pulmonary arteries is limited due to respiratory motion. No pulmonary artery embolus identified. Mediastinum/Nodes: No hilar or mediastinal adenopathy. The esophagus is grossly unremarkable. No  mediastinal fluid collection. Lungs/Pleura: Small bilateral pleural effusions. Minimal compressive atelectasis of the lung bases. Pneumonia is not excluded. There is no pneumothorax. The central airways are patent. Upper Abdomen: No acute abnormality. Musculoskeletal: Mild subcutaneous edema. No acute osseous pathology. Review of the MIP images confirms the above findings. IMPRESSION: 1. No CT evidence of pulmonary embolism. 2. Small bilateral pleural effusions with minimal compressive atelectasis of the lung bases. Pneumonia is not excluded. Electronically Signed   By: Elgie Collard M.D.   On: 03/24/2023 20:06   ECHOCARDIOGRAM COMPLETE  Result Date: 03/24/2023    ECHOCARDIOGRAM REPORT   Patient Name:    Taylor Hughes Date of Exam: 03/24/2023 Medical Rec #:  [id number]    Height:       71.0 in Accession #:    [id number]   Weight:       299.8 lb Date of Birth:  19-Aug-1970     BSA:          2.504 m Patient Age:    52 years     BP:           114/53 mmHg Patient Gender: M            HR:           129 bpm. Exam Location:  Inpatient Procedure: 2D Echo, Cardiac Doppler, Color Doppler and Intracardiac            Opacification Agent Indications:    R00.8 Other abnormalities of heart beat  History:        Patient has prior history of Echocardiogram examinations, most                 recent 08/15/2022. Arrythmias:Atrial Fibrillation; Risk                 Factors:Diabetes, Dyslipidemia and Hypertension.  Sonographer:    Milbert Coulter Referring Phys: [phone] A CALDWELL POWELL JR  Sonographer Comments: Patient is obese. Image acquisition challenging due to patient body habitus. IMPRESSIONS  1. Left ventricular ejection fraction, by estimation, is 70 to 75%. The left ventricle has hyperdynamic function. The left ventricle has no regional wall motion abnormalities. Left ventricular diastolic parameters are indeterminate.  2. Right ventricular systolic function is hyperdynamic. The right ventricular size is severely enlarged.  3. The mitral valve was not well visualized. No evidence of mitral valve regurgitation. No evidence of mitral stenosis.  4. The aortic valve was not well visualized. Aortic valve regurgitation is not visualized.  5. The inferior vena cava is normal in size with greater than 50% respiratory variability, suggesting right atrial pressure of 3 mmHg. Comparison(s): Prior images reviewed side by side. Function is now hyperdynamic. Heart rate notably faster. FINDINGS  Left Ventricle: Left ventricular ejection fraction, by estimation, is 70 to 75%. The left ventricle has hyperdynamic function. The left ventricle has no regional wall motion abnormalities. Definity contrast agent was given IV to delineate the left ventricular  endocardial borders. The left ventricular internal cavity size was normal in size. Suboptimal image quality limits for assessment of left ventricular hypertrophy. Left ventricular diastolic parameters are indeterminate. Right Ventricle: The right ventricular size is severely enlarged. Right vetricular wall thickness was not well visualized. Right ventricular systolic function is hyperdynamic. Left Atrium: Left atrial size was not well visualized. Right Atrium: Right atrial size was not well visualized. Pericardium: There is no evidence of pericardial effusion. Mitral Valve: The mitral valve was not well visualized. No evidence of mitral valve regurgitation. No evidence  of mitral valve stenosis. Tricuspid Valve: The tricuspid valve is normal in structure. Tricuspid valve regurgitation is not demonstrated. No evidence of tricuspid stenosis. Aortic Valve: The aortic valve was not well visualized. Aortic valve regurgitation is not visualized. Aortic valve mean gradient measures 5.0 mmHg. Aortic valve peak gradient measures 8.3 mmHg. Aortic valve area, by VTI measures 2.61 cm. Pulmonic Valve: The pulmonic valve was not well visualized. Pulmonic valve regurgitation is not visualized. No evidence of pulmonic stenosis. Aorta: The aortic root is normal in size and structure. Venous: The inferior vena cava is normal in size with greater than 50% respiratory variability, suggesting right atrial pressure of 3 mmHg. IAS/Shunts: The interatrial septum was not well visualized.  LEFT VENTRICLE PLAX 2D LVIDd:         4.00 cm LVIDs:         2.50 cm LV PW:         1.10 cm LV IVS:        1.20 cm LVOT diam:     1.90 cm LV SV:         48 LV SV Index:   19 LVOT Area:     2.84 cm  RIGHT VENTRICLE RV S prime:     14.40 cm/s TAPSE (M-mode): 2.1 cm LEFT ATRIUM             Index        RIGHT ATRIUM           Index LA diam:        3.50 cm 1.40 cm/m   RA Area:     17.50 cm LA Vol (A2C):   35.7 ml 14.25 ml/m  RA Volume:   47.80 ml  19.09  ml/m LA Vol (A4C):   37.9 ml 15.13 ml/m LA Biplane Vol: 39.7 ml 15.85 ml/m  AORTIC VALVE AV Area (Vmax):    2.64 cm AV Area (Vmean):   2.46 cm AV Area (VTI):     2.61 cm AV Vmax:           144.00 cm/s AV Vmean:          97.900 cm/s AV VTI:            0.185 m AV Peak Grad:      8.3 mmHg AV Mean Grad:      5.0 mmHg LVOT Vmax:         134.00 cm/s LVOT Vmean:        84.900 cm/s LVOT VTI:          0.170 m LVOT/AV VTI ratio: 0.92  SHUNTS Systemic VTI:  0.17 m Systemic Diam: 1.90 cm Riley Lam MD Electronically signed by Riley Lam MD Signature Date/Time: 03/24/2023/11:49:21 AM    Final    VAS [place] ABI WITH/WO TBI  Result Date: 03/23/2023  LOWER EXTREMITY DOPPLER STUDY Patient Name:  KELIAN ADRIANCE  Date of Exam:   03/23/2023 Medical Rec #: [id number]     Accession #:    [id number] Date of Birth: June 01, 1971      Patient Gender: M Patient Age:   58 years Exam Location[facility]  [facility] Procedure:      VAS [place] ABI WITH/WO TBI Referring Phys: A POWELL JR --------------------------------------------------------------------------------  Indications: Ulceration, and peripheral artery disease. High Risk Factors: Diabetes.  Comparison Study: No prior study Performing Technologist: Shona Simpson  Examination Guidelines: A complete evaluation includes at minimum, Doppler waveform signals and systolic blood pressure reading at the level of bilateral brachial, anterior  tibial, and posterior tibial arteries, when vessel segments are accessible. Bilateral testing is considered an integral part of a complete examination. Photoelectric Plethysmograph (PPG) waveforms and toe systolic pressure readings are included as required and additional duplex testing as needed. Limited examinations for reoccurring indications may be performed as noted.  ABI Findings: +--------+------------------+-----+---------+--------+ Right   Rt Pressure (mmHg)IndexWaveform Comment   +--------+------------------+-----+---------+--------+ [id number]                    triphasic         +--------+------------------+-----+---------+--------+ PTA     98                0.68 biphasic          +--------+------------------+-----+---------+--------+ DP      131               0.91 biphasic          +--------+------------------+-----+---------+--------+ +--------+------------------+-----+---------+-------+ Left    Lt Pressure (mmHg)IndexWaveform Comment +--------+------------------+-----+---------+-------+ [id number]                    triphasic        +--------+------------------+-----+---------+-------+ PTA     157               1.09 biphasic         +--------+------------------+-----+---------+-------+ DP      162               1.12 biphasic         +--------+------------------+-----+---------+-------+ TOES Findings:  Unable to insonate due to open wounds +---------+---------------+--------+-------+ Left ToesPressure (mmHg)WaveformComment +---------+---------------+--------+-------+ 3rd Digit               Normal          +---------+---------------+--------+-------+ Unable to assess other digits or test compression due to size/shape incompatibility with sensor   Summary: Right: Resting right ankle-brachial index indicates mild right lower extremity arterial disease. Left: Resting left ankle-brachial index is within normal range. *See table(s) above for measurements and observations.  Electronically signed by Lemar Livings MD on 03/23/2023 at 6:58:35 PM.    Final    VAS [place] LOWER EXTREMITY VENOUS (DVT)  Result Date: 03/23/2023  Lower Venous DVT Study Patient Name:  MARKECE PIZZIMENTI  Date of Exam:   03/23/2023 Medical Rec #: [id number]     Accession #:    [id number] Date of Birth: 18-Jun-1971      Patient Gender: M Patient Age:   52 years Exam Location:  [facility] Procedure:      VAS [place] LOWER EXTREMITY VENOUS (DVT) Referring Phys: A POWELL JR  --------------------------------------------------------------------------------  Indications: Edema, and ulceration.  Risk Factors: Limited mobility. Limitations: Body habitus. Comparison Study: No significant changes seen since prior exam 10/03/21. Performing Technologist: Shona Simpson  Examination Guidelines: A complete evaluation includes B-mode imaging, spectral Doppler, color Doppler, and power Doppler as needed of all accessible portions of each vessel. Bilateral testing is considered an integral part of a complete examination. Limited examinations for reoccurring indications may be performed as noted. The reflux portion of the exam is performed with the patient in reverse Trendelenburg.  +---------+---------------+---------+-----------+---------------+--------------+ RIGHT    CompressibilityPhasicitySpontaneityProperties     Thrombus Aging +---------+---------------+---------+-----------+---------------+--------------+ CFV      Full           Yes      Yes                                      +---------+---------------+---------+-----------+---------------+--------------+  SFJ      Full                                                             +---------+---------------+---------+-----------+---------------+--------------+ FV Prox  Full                                                             +---------+---------------+---------+-----------+---------------+--------------+ FV Mid   Full                                                             +---------+---------------+---------+-----------+---------------+--------------+ FV DistalFull                                                             +---------+---------------+---------+-----------+---------------+--------------+ PFV      Full                                                             +---------+---------------+---------+-----------+---------------+--------------+ POP      Full            Yes      Yes                                      +---------+---------------+---------+-----------+---------------+--------------+ PTV      Full                                                             +---------+---------------+---------+-----------+---------------+--------------+ PERO                                        Patent by color               +---------+---------------+---------+-----------+---------------+--------------+   +----+---------------+---------+-----------+----------+--------------+ LEFTCompressibilityPhasicitySpontaneityPropertiesThrombus Aging +----+---------------+---------+-----------+----------+--------------+ CFV Full           Yes      Yes                                 +----+---------------+---------+-----------+----------+--------------+    Summary: RIGHT: - There is no evidence of deep vein thrombosis in the lower extremity.  - No cystic structure  found in the popliteal fossa.  LEFT: - No evidence of common femoral vein obstruction.  *See table(s) above for measurements and observations. Electronically signed by Lemar Livings MD on 03/23/2023 at 6:56:49 PM.    Final    NM Pulmonary Perfusion  Result Date: 03/23/2023 CLINICAL DATA:  Tachycardia, RIGHT leg swelling EXAM: NUCLEAR MEDICINE PERFUSION LUNG SCAN TECHNIQUE: Perfusion images were obtained in multiple projections after intravenous injection of radiopharmaceutical. RADIOPHARMACEUTICALS:  4.2 mCi Tc-18m MAA COMPARISON:  03/23/2023 FINDINGS: No wedge-shaped peripheral perfusion defect noted in the LEFT or RIGHT lung to suggest acute pulmonary embolism. Cardiac attenuation noted. IMPRESSION: No evidence acute pulmonary embolism. Electronically Signed   By: Genevive Bi M.D.   On: 03/23/2023 16:13   US RENAL  Result Date: 03/23/2023 CLINICAL DATA:  Acute kidney injury EXAM: RENAL / URINARY TRACT ULTRASOUND COMPLETE COMPARISON:  None Available. FINDINGS: Right Kidney: Renal  measurements: 11.8 x 5.8 x 5.2 cm = volume: 183 mL. Echogenicity within normal limits. No mass or hydronephrosis visualized. Left Kidney: Renal measurements: 12.0 x 6.7 x 6.3 cm = volume: 266 mL. Echogenicity within normal limits. No mass or hydronephrosis visualized. Bladder: Not well visualized due to underdistention. Other: None. IMPRESSION: No hydronephrosis. Electronically Signed   By: Allegra Lai M.D.   On: 03/23/2023 13:49   DG Chest Port 1 View  Result Date: 03/23/2023 CLINICAL DATA:  Shortness of breath EXAM: PORTABLE CHEST 1 VIEW COMPARISON:  08/14/2022 FINDINGS: Cardiomegaly. Diffuse bilateral interstitial pulmonary opacity. The visualized skeletal structures are unremarkable. IMPRESSION: Cardiomegaly with diffuse bilateral interstitial pulmonary opacity, consistent with edema or atypical/viral infection. No focal airspace opacity. Electronically Signed   By: Jearld Lesch M.D.   On: 03/23/2023 12:24   DG Foot Complete Right  Result Date: 03/22/2023 CLINICAL DATA:  Possible diabetic infection. EXAM: RIGHT FOOT COMPLETE - 3+ VIEW COMPARISON:  None Available. FINDINGS: Erosive changes of the distal phalanges of the first and second digits. In the absence of history of amputation findings suspicious for osteomyelitis. No acute fracture. There is no dislocation. The bones are osteopenic. Diffuse skin thickening and soft tissue edema. Dressing noted over the forefoot. IMPRESSION: Erosive changes of the distal phalanges of the first and second digits suspicious for osteomyelitis. Electronically Signed   By: Elgie Collard M.D.   On: 03/22/2023 20:11      Signature  -   Susa Raring M.D on 03/25/2023 at 10:06 AM   -  To page go to [email or web address]

## 2023-03-25 NOTE — Progress Notes (Signed)
Heart rate significantly improved and normalized post right transmetatarsal amputation for osteomyelitis and right foot abscess.  He says this typically happens when he gets an infection his heart rate gets very high and resolves with treatment of his infection.  BP remains elevated so recommended starting back his home dose of Valsartan HCT.

## 2023-03-25 NOTE — Anesthesia Procedure Notes (Signed)
Procedure Name: Intubation Date/Time: 03/25/2023 12:22 PM  Performed by: Cy Blamer, CRNAPre-anesthesia Checklist: Patient identified, Emergency Drugs available, Suction available and Patient being monitored Patient Re-evaluated:Patient Re-evaluated prior to induction Oxygen Delivery Method: Circle system utilized Preoxygenation: Pre-oxygenation with 100% oxygen Induction Type: IV induction, Rapid sequence and Cricoid Pressure applied Laryngoscope Size: Miller and 2 Grade View: Grade I Tube type: Oral Tube size: 7.5 mm Number of attempts: 1 Airway Equipment and Method: Stylet and Bite block Placement Confirmation: ETT inserted through vocal cords under direct vision, positive ETCO2 and breath sounds checked- equal and bilateral Secured at: 22 cm Tube secured with: Tape Dental Injury: Teeth and Oropharynx as per pre-operative assessment  Comments: RSI for GLP-1 agonist

## 2023-03-25 NOTE — Plan of Care (Signed)
  Problem: Coping: Goal: Ability to adjust to condition or change in health will improve 03/25/2023 0701 by Reginia Naas, RN Outcome: Progressing 03/25/2023 0700 by Reginia Naas, RN Outcome: Progressing   Problem: Metabolic: Goal: Ability to maintain appropriate glucose levels will improve Outcome: Progressing   Problem: Skin Integrity: Goal: Risk for impaired skin integrity will decrease Outcome: Progressing   Problem: Activity: Goal: Risk for activity intolerance will decrease Outcome: Progressing   Problem: Coping: Goal: Level of anxiety will decrease 03/25/2023 0701 by Reginia Naas, RN Outcome: Progressing 03/25/2023 0700 by Reginia Naas, RN Outcome: Progressing 03/25/2023 0700 by Reginia Naas, RN Outcome: Progressing   Problem: Pain Managment: Goal: General experience of comfort will improve 03/25/2023 0700 by Reginia Naas, RN Outcome: Progressing 03/25/2023 0700 by Reginia Naas, RN Outcome: Progressing

## 2023-03-25 NOTE — Anesthesia Preprocedure Evaluation (Addendum)
Anesthesia Evaluation  Patient identified by MRN, date of birth, ID band Patient awake    Reviewed: Allergy & Precautions, NPO status , Patient's Chart, lab work & pertinent test results, reviewed documented beta blocker date and time   Airway Mallampati: III  TM Distance: >3 FB Neck ROM: Full    Dental  (+) Poor Dentition, Missing   Pulmonary neg pulmonary ROS   Pulmonary exam normal breath sounds clear to auscultation       Cardiovascular hypertension, Pt. on medications Normal cardiovascular exam+ dysrhythmias Atrial Fibrillation  Rhythm:Regular Rate:Normal  TTE 2019 EF 65-70%, no valvular abnormalities   Neuro/Psych  PSYCHIATRIC DISORDERS  Depression    negative neurological ROS     GI/Hepatic Neg liver ROS,GERD  Medicated,,  Endo/Other  diabetes, Poorly Controlled, Type 2, Insulin Dependent  Hyperlipidemia  Renal/GU Renal disease  negative genitourinary   Musculoskeletal Osteomyelitis/ Abscess right foot   Abdominal   Peds  Hematology  (+) Blood dyscrasia, anemia   Anesthesia Other Findings   Reproductive/Obstetrics ED                             Anesthesia Physical Anesthesia Plan  ASA: 3  Anesthesia Plan: General   Post-op Pain Management: Regional block* and Minimal or no pain anticipated   Induction: Intravenous  PONV Risk Score and Plan: 2 and Ondansetron and Midazolam  Airway Management Planned: Oral ETT  Additional Equipment: None  Intra-op Plan:   Post-operative Plan: Extubation in OR  Informed Consent: I have reviewed the patients History and Physical, chart, labs and discussed the procedure including the risks, benefits and alternatives for the proposed anesthesia with the patient or authorized representative who has indicated his/her understanding and acceptance.     Dental advisory given  Plan Discussed with: CRNA and Anesthesiologist  Anesthesia Plan  Comments: (  )        Anesthesia Quick Evaluation

## 2023-03-25 NOTE — Progress Notes (Signed)
Inpatient Rehab Admissions Coordinator:   Consult received and chart reviewed.  Pt admitted for osteomyelitis, now s/p right TMA.  Reviewed coverages and pts insurance would not cover CIR admission, he would be self pay.  Does not qualify for SSD per financial navigator review.  Therapy evaluations pending.    Estill Dooms, PT, DPT Admissions Coordinator [phone] 03/25/23  4:12 PM

## 2023-03-25 NOTE — Progress Notes (Addendum)
Patient verbalized that he wants to receive blood/blood products if will be an emergency.

## 2023-03-25 NOTE — Progress Notes (Signed)
OT Cancellation Note  Patient Details Name: Taylor Hughes MRN: [id number] DOB: 21-Jan-1971   Cancelled Treatment:    Reason Eval/Treat Not Completed: Patient at procedure or test/ unavailable (Patient in surgery. Will follow-up post-op as appropriate.)  03/25/2023  AB, OTR/L  Acute Rehabilitation Services  Office: [phone]   [name] 03/25/2023, 11:10 AM

## 2023-03-25 NOTE — Progress Notes (Signed)
Progress Note  Patient Name: Keevan Kimberling Date of Encounter: 03/25/2023  [facility] Cardiologist: None   Patient Profile  Cardiac Studies & Procedures       ECHOCARDIOGRAM  ECHOCARDIOGRAM COMPLETE 03/24/2023  Narrative ECHOCARDIOGRAM REPORT    Patient Name:   CLYDELL MAHANNAH Date of Exam: 03/24/2023 Medical Rec #:  [id number]    Height:       71.0 in Accession #:    [id number]   Weight:       299.8 lb Date of Birth:  1970/10/22     BSA:          2.504 m Patient Age:    52 years     BP:           114/53 mmHg Patient Gender: M            HR:           129 bpm. Exam Location:  Inpatient  Procedure: 2D Echo, Cardiac Doppler, Color Doppler and Intracardiac Opacification Agent  Indications:    R00.8 Other abnormalities of heart beat  History:        Patient has prior history of Echocardiogram examinations, most recent 08/15/2022. Arrythmias:Atrial Fibrillation; Risk Factors:Diabetes, Dyslipidemia and Hypertension.  Sonographer:    Milbert Coulter Referring Phys: [phone] A CALDWELL POWELL JR   Sonographer Comments: Patient is obese. Image acquisition challenging due to patient body habitus. IMPRESSIONS   1. Left ventricular ejection fraction, by estimation, is 70 to 75%. The left ventricle has hyperdynamic function. The left ventricle has no regional wall motion abnormalities. Left ventricular diastolic parameters are indeterminate. 2. Right ventricular systolic function is hyperdynamic. The right ventricular size is severely enlarged. 3. The mitral valve was not well visualized. No evidence of mitral valve regurgitation. No evidence of mitral stenosis. 4. The aortic valve was not well visualized. Aortic valve regurgitation is not visualized. 5. The inferior vena cava is normal in size with greater than 50% respiratory variability, suggesting right atrial pressure of 3 mmHg.  Comparison(s): Prior images reviewed side by side. Function is now hyperdynamic. Heart rate notably  faster.  FINDINGS Left Ventricle: Left ventricular ejection fraction, by estimation, is 70 to 75%. The left ventricle has hyperdynamic function. The left ventricle has no regional wall motion abnormalities. Definity contrast agent was given IV to delineate the left ventricular endocardial borders. The left ventricular internal cavity size was normal in size. Suboptimal image quality limits for assessment of left ventricular hypertrophy. Left ventricular diastolic parameters are indeterminate.  Right Ventricle: The right ventricular size is severely enlarged. Right vetricular wall thickness was not well visualized. Right ventricular systolic function is hyperdynamic.  Left Atrium: Left atrial size was not well visualized.  Right Atrium: Right atrial size was not well visualized.  Pericardium: There is no evidence of pericardial effusion.  Mitral Valve: The mitral valve was not well visualized. No evidence of mitral valve regurgitation. No evidence of mitral valve stenosis.  Tricuspid Valve: The tricuspid valve is normal in structure. Tricuspid valve regurgitation is not demonstrated. No evidence of tricuspid stenosis.  Aortic Valve: The aortic valve was not well visualized. Aortic valve regurgitation is not visualized. Aortic valve mean gradient measures 5.0 mmHg. Aortic valve peak gradient measures 8.3 mmHg. Aortic valve area, by VTI measures 2.61 cm.  Pulmonic Valve: The pulmonic valve was not well visualized. Pulmonic valve regurgitation is not visualized. No evidence of pulmonic stenosis.  Aorta: The aortic root is normal in size and structure.  Venous:  The inferior vena cava is normal in size with greater than 50% respiratory variability, suggesting right atrial pressure of 3 mmHg.  IAS/Shunts: The interatrial septum was not well visualized.   LEFT VENTRICLE PLAX 2D LVIDd:         4.00 cm LVIDs:         2.50 cm LV PW:         1.10 cm LV IVS:        1.20 cm LVOT diam:     1.90  cm LV SV:         48 LV SV Index:   19 LVOT Area:     2.84 cm   RIGHT VENTRICLE RV S prime:     14.40 cm/s TAPSE (M-mode): 2.1 cm  LEFT ATRIUM             Index        RIGHT ATRIUM           Index LA diam:        3.50 cm 1.40 cm/m   RA Area:     17.50 cm LA Vol (A2C):   35.7 ml 14.25 ml/m  RA Volume:   47.80 ml  19.09 ml/m LA Vol (A4C):   37.9 ml 15.13 ml/m LA Biplane Vol: 39.7 ml 15.85 ml/m AORTIC VALVE AV Area (Vmax):    2.64 cm AV Area (Vmean):   2.46 cm AV Area (VTI):     2.61 cm AV Vmax:           144.00 cm/s AV Vmean:          97.900 cm/s AV VTI:            0.185 m AV Peak Grad:      8.3 mmHg AV Mean Grad:      5.0 mmHg LVOT Vmax:         134.00 cm/s LVOT Vmean:        84.900 cm/s LVOT VTI:          0.170 m LVOT/AV VTI ratio: 0.92   SHUNTS Systemic VTI:  0.17 m Systemic Diam: 1.90 cm  Riley Lam MD Electronically signed by Riley Lam MD Signature Date/Time: 03/24/2023/11:49:21 AM    Final             Subjective   Remains tachy on tele at 120bpm but appears to be sinus tach  Inpatient Medications    Scheduled Meds:  buPROPion  150 mg Oral Daily   gabapentin  300 mg Oral BID   heparin injection (subcutaneous)  5,000 Units Subcutaneous Q8H   insulin aspart  0-15 Units Subcutaneous Q4H   insulin glargine-yfgn  30 Units Subcutaneous QHS   metoCLOPramide (REGLAN) injection  10 mg Intravenous Q6H   metoprolol tartrate  25 mg Oral BID   metroNIDAZOLE  500 mg Oral Q12H   midodrine  10 mg Oral TID WC   mupirocin ointment  1 Application Nasal BID   pantoprazole (PROTONIX) IV  40 mg Intravenous Q24H   Continuous Infusions:   ceFAZolin (ANCEF) IV     ceFEPime (MAXIPIME) IV 2 g (03/25/23 0526)   lactated ringers     lactated ringers 75 mL/hr at 03/25/23 0635   promethazine (PHENERGAN) injection (IM or IVPB)     vancomycin 1,250 mg (03/24/23 2327)   PRN Meds: acetaminophen **OR** acetaminophen, metoprolol tartrate, morphine  injection, promethazine **OR** promethazine (PHENERGAN) injection (IM or IVPB) **OR** promethazine, senna-docusate, simethicone   Vital Signs  Vitals:   03/25/23 0200 03/25/23 0400 03/25/23 0414 03/25/23 0753  BP:  105/61 105/61 110/60  Pulse: (!) 125 (!) 127 (!) 127   Resp: (!) 27 (!) 30 18 17   Temp:  100.1 F (37.8 C) 100.1 F (37.8 C) 98.6 F (37 C)  TempSrc:  Oral Oral Oral  SpO2: 95% 93% 95%   Weight:      Height:        Intake/Output Summary (Last 24 hours) at 03/25/2023 0941 Last data filed at 03/25/2023 0600 Gross per 24 hour  Intake 480 ml  Output 800 ml  Net -320 ml      03/23/2023    2:42 AM 03/22/2023    7:32 PM 01/19/2023   11:13 AM  Last 3 Weights  Weight (lbs) 299 lb 13.2 oz 299 lb 9.7 oz 299 lb 9.6 oz  Weight (kg) 136 kg 135.9 kg 135.898 kg      Telemetry    Sinus tachycardia at 120bpm - Personally Reviewed  ECG    No new EKG to review - Personally Reviewed  Physical Exam   GEN: No acute distress.   Neck: No JVD Cardiac: RRR, no murmurs, rubs, or gallops.  Respiratory: Clear to auscultation bilaterally. GI: Soft, nontender, non-distended  MS: No edema; No deformity. Neuro:  Nonfocal  Psych: Normal affect   Labs    High Sensitivity Troponin:  No results for input(s): "TROPONINIHS" in the last 720 hours.    Chemistry Recent Labs  Lab 03/22/23 2023 03/23/23 0227 03/24/23 0316 03/25/23 0109  NA 130* 132* 137 134*  K 5.0 4.3 3.6 3.8  CL 99 102 106 102  CO2 11* 12* 22 24  GLUCOSE 405* 410* 126* 205*  BUN 26* 28* 21* 19  CREATININE 1.52* 1.70* 1.41* 1.27*  CALCIUM 8.7* 8.7* 8.2* 7.8*  PROT 8.1 7.1 6.7  --   ALBUMIN 2.1* 1.8* 1.6*  --   AST 14* 11* 14*  --   ALT 15 13 14   --   ALKPHOS 64 56 50  --   BILITOT 1.2 1.1 0.4  --   GFRNONAA 55* 48* >60 >60  ANIONGAP 20* 18* 9 8     Hematology Recent Labs  Lab 03/23/23 0227 03/24/23 0316 03/25/23 0109  WBC 19.1* 17.1* 15.5*  RBC 3.02* 3.09* 2.83*  HGB 8.2* 8.5* 7.7*  HCT 25.8*  26.0* 23.6*  MCV 85.4 84.1 83.4  MCH 27.2 27.5 27.2  MCHC 31.8 32.7 32.6  RDW 14.9 14.6 14.6  PLT 469* 465* 461*    BNP Recent Labs  Lab 03/23/23 1859 03/25/23 0819  BNP 462.8* 524.5*     DDimer No results for input(s): "DDIMER" in the last 168 hours.   Radiology    DG Chest Port 1 View  Result Date: 03/25/2023 CLINICAL DATA:  Shortness of breath. EXAM: PORTABLE CHEST 1 VIEW COMPARISON:  03/23/2023 FINDINGS: Low volume film. Cardiopericardial silhouette is at upper limits of normal for size. There is pulmonary vascular congestion. Persistent diffuse interstitial prominence may be chronic but component of interstitial edema is not excluded. No dense focal airspace consolidation or evidence of pleural effusion. IMPRESSION: Low volume film with pulmonary vascular congestion and diffuse interstitial prominence. Interstitial edema not excluded. Electronically Signed   By: Kennith Center M.D.   On: 03/25/2023 06:14   CT Angio Chest Pulmonary Embolism (PE) W or WO Contrast  Result Date: 03/24/2023 CLINICAL DATA:  Concern for pulmonary embolism. EXAM: CT ANGIOGRAPHY CHEST WITH CONTRAST TECHNIQUE: Multidetector CT  imaging of the chest was performed using the standard protocol during bolus administration of intravenous contrast. Multiplanar CT image reconstructions and MIPs were obtained to evaluate the vascular anatomy. RADIATION DOSE REDUCTION: This exam was performed according to the departmental dose-optimization program which includes automated exposure control, adjustment of the mA and/or kV according to patient size and/or use of iterative reconstruction technique. CONTRAST:  75mL OMNIPAQUE IOHEXOL 350 MG/ML SOLN COMPARISON:  Chest CT dated 05/23/2019. FINDINGS: Cardiovascular: There is no cardiomegaly or pericardial effusion. There is coronary vascular calcification. The thoracic aorta is unremarkable. Evaluation of the pulmonary arteries is limited due to respiratory motion. No pulmonary  artery embolus identified. Mediastinum/Nodes: No hilar or mediastinal adenopathy. The esophagus is grossly unremarkable. No mediastinal fluid collection. Lungs/Pleura: Small bilateral pleural effusions. Minimal compressive atelectasis of the lung bases. Pneumonia is not excluded. There is no pneumothorax. The central airways are patent. Upper Abdomen: No acute abnormality. Musculoskeletal: Mild subcutaneous edema. No acute osseous pathology. Review of the MIP images confirms the above findings. IMPRESSION: 1. No CT evidence of pulmonary embolism. 2. Small bilateral pleural effusions with minimal compressive atelectasis of the lung bases. Pneumonia is not excluded. Electronically Signed   By: Elgie Collard M.D.   On: 03/24/2023 20:06   ECHOCARDIOGRAM COMPLETE  Result Date: 03/24/2023    ECHOCARDIOGRAM REPORT   Patient Name:   YAVIER WESTBY Date of Exam: 03/24/2023 Medical Rec #:  [id number]    Height:       71.0 in Accession #:    [id number]   Weight:       299.8 lb Date of Birth:  December 05, 1970     BSA:          2.504 m Patient Age:    52 years     BP:           114/53 mmHg Patient Gender: M            HR:           129 bpm. Exam Location:  Inpatient Procedure: 2D Echo, Cardiac Doppler, Color Doppler and Intracardiac            Opacification Agent Indications:    R00.8 Other abnormalities of heart beat  History:        Patient has prior history of Echocardiogram examinations, most                 recent 08/15/2022. Arrythmias:Atrial Fibrillation; Risk                 Factors:Diabetes, Dyslipidemia and Hypertension.  Sonographer:    Milbert Coulter Referring Phys: [phone] A CALDWELL POWELL JR  Sonographer Comments: Patient is obese. Image acquisition challenging due to patient body habitus. IMPRESSIONS  1. Left ventricular ejection fraction, by estimation, is 70 to 75%. The left ventricle has hyperdynamic function. The left ventricle has no regional wall motion abnormalities. Left ventricular diastolic parameters are  indeterminate.  2. Right ventricular systolic function is hyperdynamic. The right ventricular size is severely enlarged.  3. The mitral valve was not well visualized. No evidence of mitral valve regurgitation. No evidence of mitral stenosis.  4. The aortic valve was not well visualized. Aortic valve regurgitation is not visualized.  5. The inferior vena cava is normal in size with greater than 50% respiratory variability, suggesting right atrial pressure of 3 mmHg. Comparison(s): Prior images reviewed side by side. Function is now hyperdynamic. Heart rate notably faster. FINDINGS  Left Ventricle: Left ventricular ejection fraction, by estimation,  is 70 to 75%. The left ventricle has hyperdynamic function. The left ventricle has no regional wall motion abnormalities. Definity contrast agent was given IV to delineate the left ventricular endocardial borders. The left ventricular internal cavity size was normal in size. Suboptimal image quality limits for assessment of left ventricular hypertrophy. Left ventricular diastolic parameters are indeterminate. Right Ventricle: The right ventricular size is severely enlarged. Right vetricular wall thickness was not well visualized. Right ventricular systolic function is hyperdynamic. Left Atrium: Left atrial size was not well visualized. Right Atrium: Right atrial size was not well visualized. Pericardium: There is no evidence of pericardial effusion. Mitral Valve: The mitral valve was not well visualized. No evidence of mitral valve regurgitation. No evidence of mitral valve stenosis. Tricuspid Valve: The tricuspid valve is normal in structure. Tricuspid valve regurgitation is not demonstrated. No evidence of tricuspid stenosis. Aortic Valve: The aortic valve was not well visualized. Aortic valve regurgitation is not visualized. Aortic valve mean gradient measures 5.0 mmHg. Aortic valve peak gradient measures 8.3 mmHg. Aortic valve area, by VTI measures 2.61 cm. Pulmonic  Valve: The pulmonic valve was not well visualized. Pulmonic valve regurgitation is not visualized. No evidence of pulmonic stenosis. Aorta: The aortic root is normal in size and structure. Venous: The inferior vena cava is normal in size with greater than 50% respiratory variability, suggesting right atrial pressure of 3 mmHg. IAS/Shunts: The interatrial septum was not well visualized.  LEFT VENTRICLE PLAX 2D LVIDd:         4.00 cm LVIDs:         2.50 cm LV PW:         1.10 cm LV IVS:        1.20 cm LVOT diam:     1.90 cm LV SV:         48 LV SV Index:   19 LVOT Area:     2.84 cm  RIGHT VENTRICLE RV S prime:     14.40 cm/s TAPSE (M-mode): 2.1 cm LEFT ATRIUM             Index        RIGHT ATRIUM           Index LA diam:        3.50 cm 1.40 cm/m   RA Area:     17.50 cm LA Vol (A2C):   35.7 ml 14.25 ml/m  RA Volume:   47.80 ml  19.09 ml/m LA Vol (A4C):   37.9 ml 15.13 ml/m LA Biplane Vol: 39.7 ml 15.85 ml/m  AORTIC VALVE AV Area (Vmax):    2.64 cm AV Area (Vmean):   2.46 cm AV Area (VTI):     2.61 cm AV Vmax:           144.00 cm/s AV Vmean:          97.900 cm/s AV VTI:            0.185 m AV Peak Grad:      8.3 mmHg AV Mean Grad:      5.0 mmHg LVOT Vmax:         134.00 cm/s LVOT Vmean:        84.900 cm/s LVOT VTI:          0.170 m LVOT/AV VTI ratio: 0.92  SHUNTS Systemic VTI:  0.17 m Systemic Diam: 1.90 cm Riley Lam MD Electronically signed by Riley Lam MD Signature Date/Time: 03/24/2023/11:49:21 AM    Final    VAS [place]  ABI WITH/WO TBI  Result Date: 03/23/2023  LOWER EXTREMITY DOPPLER STUDY Patient Name:  AMERION THONG  Date of Exam:   03/23/2023 Medical Rec #: [id number]     Accession #:    [id number] Date of Birth: 11-16-1970      Patient Gender: M Patient Age:   52 years Exam Location:  [facility] Procedure:      VAS [place] ABI WITH/WO TBI Referring Phys: A POWELL JR --------------------------------------------------------------------------------  Indications: Ulceration, and  peripheral artery disease. High Risk Factors: Diabetes.  Comparison Study: No prior study Performing Technologist: Shona Simpson  Examination Guidelines: A complete evaluation includes at minimum, Doppler waveform signals and systolic blood pressure reading at the level of bilateral brachial, anterior tibial, and posterior tibial arteries, when vessel segments are accessible. Bilateral testing is considered an integral part of a complete examination. Photoelectric Plethysmograph (PPG) waveforms and toe systolic pressure readings are included as required and additional duplex testing as needed. Limited examinations for reoccurring indications may be performed as noted.  ABI Findings: +--------+------------------+-----+---------+--------+ Right   Rt Pressure (mmHg)IndexWaveform Comment  +--------+------------------+-----+---------+--------+ [id number]                    triphasic         +--------+------------------+-----+---------+--------+ PTA     98                0.68 biphasic          +--------+------------------+-----+---------+--------+ DP      131               0.91 biphasic          +--------+------------------+-----+---------+--------+ +--------+------------------+-----+---------+-------+ Left    Lt Pressure (mmHg)IndexWaveform Comment +--------+------------------+-----+---------+-------+ [id number]                    triphasic        +--------+------------------+-----+---------+-------+ PTA     157               1.09 biphasic         +--------+------------------+-----+---------+-------+ DP      162               1.12 biphasic         +--------+------------------+-----+---------+-------+ TOES Findings:  Unable to insonate due to open wounds +---------+---------------+--------+-------+ Left ToesPressure (mmHg)WaveformComment +---------+---------------+--------+-------+ 3rd Digit               Normal           +---------+---------------+--------+-------+ Unable to assess other digits or test compression due to size/shape incompatibility with sensor   Summary: Right: Resting right ankle-brachial index indicates mild right lower extremity arterial disease. Left: Resting left ankle-brachial index is within normal range. *See table(s) above for measurements and observations.  Electronically signed by Lemar Livings MD on 03/23/2023 at 6:58:35 PM.    Final    VAS [place] LOWER EXTREMITY VENOUS (DVT)  Result Date: 03/23/2023  Lower Venous DVT Study Patient Name:  SANATH SANE  Date of Exam:   03/23/2023 Medical Rec #: [id number]     Accession #:    [id number] Date of Birth: 09/16/1970      Patient Gender: M Patient Age:   6 years Exam Location:  [facility] Procedure:      VAS [place] LOWER EXTREMITY VENOUS (DVT) Referring Phys: A POWELL JR --------------------------------------------------------------------------------  Indications: Edema, and ulceration.  Risk Factors: Limited mobility. Limitations: Body habitus. Comparison Study: No significant changes seen since prior exam 10/03/21. Performing  Technologist: Shona Simpson  Examination Guidelines: A complete evaluation includes B-mode imaging, spectral Doppler, color Doppler, and power Doppler as needed of all accessible portions of each vessel. Bilateral testing is considered an integral part of a complete examination. Limited examinations for reoccurring indications may be performed as noted. The reflux portion of the exam is performed with the patient in reverse Trendelenburg.  +---------+---------------+---------+-----------+---------------+--------------+ RIGHT    CompressibilityPhasicitySpontaneityProperties     Thrombus Aging +---------+---------------+---------+-----------+---------------+--------------+ CFV      Full           Yes      Yes                                      +---------+---------------+---------+-----------+---------------+--------------+  SFJ      Full                                                             +---------+---------------+---------+-----------+---------------+--------------+ FV Prox  Full                                                             +---------+---------------+---------+-----------+---------------+--------------+ FV Mid   Full                                                             +---------+---------------+---------+-----------+---------------+--------------+ FV DistalFull                                                             +---------+---------------+---------+-----------+---------------+--------------+ PFV      Full                                                             +---------+---------------+---------+-----------+---------------+--------------+ POP      Full           Yes      Yes                                      +---------+---------------+---------+-----------+---------------+--------------+ PTV      Full                                                             +---------+---------------+---------+-----------+---------------+--------------+  PERO                                        Patent by color               +---------+---------------+---------+-----------+---------------+--------------+   +----+---------------+---------+-----------+----------+--------------+ LEFTCompressibilityPhasicitySpontaneityPropertiesThrombus Aging +----+---------------+---------+-----------+----------+--------------+ CFV Full           Yes      Yes                                 +----+---------------+---------+-----------+----------+--------------+    Summary: RIGHT: - There is no evidence of deep vein thrombosis in the lower extremity.  - No cystic structure found in the popliteal fossa.  LEFT: - No evidence of common femoral vein obstruction.  *See table(s) above for measurements and observations. Electronically signed by Lemar Livings MD  on 03/23/2023 at 6:56:49 PM.    Final    NM Pulmonary Perfusion  Result Date: 03/23/2023 CLINICAL DATA:  Tachycardia, RIGHT leg swelling EXAM: NUCLEAR MEDICINE PERFUSION LUNG SCAN TECHNIQUE: Perfusion images were obtained in multiple projections after intravenous injection of radiopharmaceutical. RADIOPHARMACEUTICALS:  4.2 mCi Tc-10m MAA COMPARISON:  03/23/2023 FINDINGS: No wedge-shaped peripheral perfusion defect noted in the LEFT or RIGHT lung to suggest acute pulmonary embolism. Cardiac attenuation noted. IMPRESSION: No evidence acute pulmonary embolism. Electronically Signed   By: Genevive Bi M.D.   On: 03/23/2023 16:13   US RENAL  Result Date: 03/23/2023 CLINICAL DATA:  Acute kidney injury EXAM: RENAL / URINARY TRACT ULTRASOUND COMPLETE COMPARISON:  None Available. FINDINGS: Right Kidney: Renal measurements: 11.8 x 5.8 x 5.2 cm = volume: 183 mL. Echogenicity within normal limits. No mass or hydronephrosis visualized. Left Kidney: Renal measurements: 12.0 x 6.7 x 6.3 cm = volume: 266 mL. Echogenicity within normal limits. No mass or hydronephrosis visualized. Bladder: Not well visualized due to underdistention. Other: None. IMPRESSION: No hydronephrosis. Electronically Signed   By: Allegra Lai M.D.   On: 03/23/2023 13:49   DG Chest Port 1 View  Result Date: 03/23/2023 CLINICAL DATA:  Shortness of breath EXAM: PORTABLE CHEST 1 VIEW COMPARISON:  08/14/2022 FINDINGS: Cardiomegaly. Diffuse bilateral interstitial pulmonary opacity. The visualized skeletal structures are unremarkable. IMPRESSION: Cardiomegaly with diffuse bilateral interstitial pulmonary opacity, consistent with edema or atypical/viral infection. No focal airspace opacity. Electronically Signed   By: Jearld Lesch M.D.   On: 03/23/2023 12:24    Patient Profile     52 y.o. male  with a hx of type 2 diabetes, hypertension, hyperlipidemia, depression, who is being seen 03/24/2023 for the evaluation of SVT at the request of Dr.  Lowell Guitar.   Assessment & Plan    Tachycardia -3 twelve-lead EKGs were personally reviewed and all demonstrate tachycardia that appears to be sinus tachycardia EKG #1 on 03/23/2023 showed SVT at 130 bpm but when looking closely there appears to be P waves in the prior T wave with long PR interval and likely represent sinus tachycardia EKG #2 on 03/24/2023 at 8:18 AM clearly demonstrates sinus tachycardia at 122 bpm EKG #3 done 03/24/2023 at 1639 demonstrates SVT at 140 bpm with what appears to be some P waves noted in the T waves and still think it sinus tachycardia   -Unclear etiology of his tachycardia.  I do not think this is SVT because you can make out some P waves in the prior  T wave and when heart rate slows consistent P waves are evident and the PR interval appears to shorten.  I suspect at faster heart rates he cannot conduct as rapidly through the AV node and his PR interval prolongs. -His TSH is normal and T4 is only minimally elevated to suspect that is not the etiology.  - He told me that whenever he gets any type of infection he has a rapid heart rate and then gets anxious on top of it and has to come to the hospital and once the infection clears his heart rate goes back to normal.   - I personally reviewed the 2D echo and the RV does appear generous although I do not think it is severely enlarged.  TAPSE is normal consistent with normal RV function.   -VQ scan showed no obvious PE and Chest CTA with no PE -HR still in the 120's but EKG today definitely c/w sinus tach likely related to underlying infection -will increase Lopressor to 37.5mg  BID      For questions or updates, please contact [facility] Please consult [email or web address] for contact info under        Signed, Armanda Magic, MD  03/25/2023, 9:41 AM

## 2023-03-26 ENCOUNTER — Encounter (HOSPITAL_COMMUNITY): Payer: Self-pay | Admitting: Orthopedic Surgery

## 2023-03-26 ENCOUNTER — Inpatient Hospital Stay: Payer: PRIVATE HEALTH INSURANCE

## 2023-03-26 ENCOUNTER — Telehealth: Payer: Self-pay

## 2023-03-26 ENCOUNTER — Other Ambulatory Visit: Payer: Self-pay | Admitting: Cardiology

## 2023-03-26 DIAGNOSIS — E1169 Type 2 diabetes mellitus with other specified complication: Secondary | ICD-10-CM | POA: Diagnosis not present

## 2023-03-26 DIAGNOSIS — Z89431 Acquired absence of right foot: Secondary | ICD-10-CM

## 2023-03-26 DIAGNOSIS — I480 Paroxysmal atrial fibrillation: Secondary | ICD-10-CM

## 2023-03-26 DIAGNOSIS — E11628 Type 2 diabetes mellitus with other skin complications: Secondary | ICD-10-CM | POA: Diagnosis not present

## 2023-03-26 DIAGNOSIS — L089 Local infection of the skin and subcutaneous tissue, unspecified: Secondary | ICD-10-CM | POA: Diagnosis not present

## 2023-03-26 DIAGNOSIS — R000 Tachycardia, unspecified: Secondary | ICD-10-CM

## 2023-03-26 DIAGNOSIS — M86271 Subacute osteomyelitis, right ankle and foot: Secondary | ICD-10-CM | POA: Diagnosis not present

## 2023-03-26 LAB — BASIC METABOLIC PANEL
Anion gap: 11 (ref 5–15)
BUN: 22 mg/dL — ABNORMAL HIGH (ref 6–20)
CO2: 22 mmol/L (ref 22–32)
Calcium: 8.1 mg/dL — ABNORMAL LOW (ref 8.9–10.3)
Chloride: 103 mmol/L (ref 98–111)
Creatinine, Ser: 1.46 mg/dL — ABNORMAL HIGH (ref 0.61–1.24)
GFR, Estimated: 58 mL/min — ABNORMAL LOW (ref >60)
Glucose, Bld: 210 mg/dL — ABNORMAL HIGH (ref 70–99)
Potassium: 3.9 mmol/L (ref 3.5–5.1)
Sodium: 136 mmol/L (ref 135–145)

## 2023-03-26 LAB — CBC WITH DIFFERENTIAL/PLATELET
Abs Immature Granulocytes: 0.13 10*3/uL — ABNORMAL HIGH (ref 0.00–0.07)
Basophils Absolute: 0 10*3/uL (ref 0.0–0.1)
Basophils Relative: 0 %
Eosinophils Absolute: 0.2 10*3/uL (ref 0.0–0.5)
Eosinophils Relative: 2 %
HCT: 24.5 % — ABNORMAL LOW (ref 39.0–52.0)
Hemoglobin: 7.8 g/dL — ABNORMAL LOW (ref 13.0–17.0)
Immature Granulocytes: 1 %
Lymphocytes Relative: 13 %
Lymphs Abs: 1.7 10*3/uL (ref 0.7–4.0)
MCH: 26.7 pg (ref 26.0–34.0)
MCHC: 31.8 g/dL (ref 30.0–36.0)
MCV: 83.9 fL (ref 80.0–100.0)
Monocytes Absolute: 1 10*3/uL (ref 0.1–1.0)
Monocytes Relative: 8 %
Neutro Abs: 10.1 10*3/uL — ABNORMAL HIGH (ref 1.7–7.7)
Neutrophils Relative %: 76 %
Platelets: 470 10*3/uL — ABNORMAL HIGH (ref 150–400)
RBC: 2.92 MIL/uL — ABNORMAL LOW (ref 4.22–5.81)
RDW: 15 % (ref 11.5–15.5)
WBC: 13.2 10*3/uL — ABNORMAL HIGH (ref 4.0–10.5)
nRBC: 0 % (ref 0.0–0.2)

## 2023-03-26 LAB — PROCALCITONIN: Procalcitonin: 0.6 ng/mL

## 2023-03-26 LAB — RETICULOCYTES
Immature Retic Fract: 11.4 % (ref 2.3–15.9)
RBC.: 2.65 MIL/uL — ABNORMAL LOW (ref 4.22–5.81)
Retic Count, Absolute: 31.3 10*3/uL (ref 19.0–186.0)
Retic Ct Pct: 1.2 % (ref 0.4–3.1)

## 2023-03-26 LAB — IRON AND TIBC
Iron: 13 ug/dL — ABNORMAL LOW (ref 45–182)
Saturation Ratios: 10 % — ABNORMAL LOW (ref 17.9–39.5)
TIBC: 130 ug/dL — ABNORMAL LOW (ref 250–450)
UIBC: 117 ug/dL

## 2023-03-26 LAB — GLUCOSE, CAPILLARY
Glucose-Capillary: 223 mg/dL — ABNORMAL HIGH (ref 70–99)
Glucose-Capillary: 173 mg/dL — ABNORMAL HIGH (ref 70–99)
Glucose-Capillary: 264 mg/dL — ABNORMAL HIGH (ref 70–99)
Glucose-Capillary: 260 mg/dL — ABNORMAL HIGH (ref 70–99)

## 2023-03-26 LAB — FOLATE: Folate: 13 ng/mL (ref >5.9)

## 2023-03-26 LAB — MAGNESIUM: Magnesium: 1.9 mg/dL (ref 1.7–2.4)

## 2023-03-26 LAB — PHOSPHORUS: Phosphorus: 2.7 mg/dL (ref 2.5–4.6)

## 2023-03-26 LAB — VITAMIN B12: Vitamin B-12: 1536 pg/mL — ABNORMAL HIGH (ref 180–914)

## 2023-03-26 LAB — C-REACTIVE PROTEIN: CRP: 25.5 mg/dL — ABNORMAL HIGH (ref <1.0)

## 2023-03-26 LAB — FERRITIN: Ferritin: 259 ng/mL (ref 24–336)

## 2023-03-26 LAB — BRAIN NATRIURETIC PEPTIDE: B Natriuretic Peptide: 218.5 pg/mL — ABNORMAL HIGH (ref 0.0–100.0)

## 2023-03-26 MED ORDER — SODIUM CHLORIDE 0.9 % IV SOLN
2.0000 g | Freq: Every day | INTRAVENOUS | Status: DC
  Administered 2023-03-26 – 2023-03-30 (×5): 2 g via INTRAVENOUS
  Filled 2023-03-26 (×7): qty 20

## 2023-03-26 MED ORDER — INSULIN ASPART 100 UNIT/ML IJ SOLN: 0.0000 [IU] | Freq: Three times a day (TID) | INTRAMUSCULAR | Status: DC

## 2023-03-26 MED ORDER — SODIUM CHLORIDE 0.9 % IV SOLN
8.0000 mg/kg | Freq: Every day | INTRAVENOUS | Status: DC
  Administered 2023-03-26 – 2023-04-04 (×10): 750 mg via INTRAVENOUS
  Filled 2023-03-26 (×11): qty 15

## 2023-03-26 MED ORDER — INSULIN ASPART 100 UNIT/ML IJ SOLN
0.0000 [IU] | Freq: Three times a day (TID) | INTRAMUSCULAR | Status: DC
  Administered 2023-03-26 – 2023-03-27 (×2): 3 [IU] via SUBCUTANEOUS

## 2023-03-26 NOTE — Progress Notes (Signed)
OT came to me and said they had slightly pulled out patient's IV.   I knew pt had two IV sites so went in to change IV infusing and the second site the catheter was also out.  I placed an order for the IV team.  Currently still waiting and have not been able to hang antibiotics.

## 2023-03-26 NOTE — Progress Notes (Signed)
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  TEle with NSR  Inpatient Medications    Scheduled Meds:  vitamin C  1,000 mg Oral Daily   aspirin EC  81 mg Oral Daily   atorvastatin  20 mg Oral Daily   buPROPion  150 mg Oral Daily   docusate sodium  100 mg Oral Daily   gabapentin  300 mg Oral BID   heparin injection (subcutaneous)  5,000 Units Subcutaneous Q8H   insulin aspart  0-15 Units Subcutaneous Q4H   insulin glargine-yfgn  20 Units Subcutaneous QHS   metoCLOPramide (REGLAN) injection  10 mg Intravenous Q6H   metoprolol tartrate  37.5 mg Oral BID   metroNIDAZOLE  500 mg Oral Q12H   midodrine  10 mg Oral TID WC   mupirocin ointment  1 Application Nasal BID   nutrition supplement (JUVEN)  1 packet Oral BID BM   pantoprazole  40 mg Oral Daily   pantoprazole (PROTONIX) IV  40 mg Intravenous Q24H   zinc sulfate  220 mg Oral Daily   Continuous Infusions:  ceFEPime (MAXIPIME) IV 2 g (03/26/23 0559)   magnesium sulfate bolus IVPB     potassium PHOSPHATE IVPB (in mmol)     promethazine (PHENERGAN) injection (IM or IVPB)     vancomycin 1,250 mg (03/26/23 1008)   PRN Meds: acetaminophen **OR** acetaminophen, acetaminophen, alum & mag hydroxide-simeth, bisacodyl, guaiFENesin-dextromethorphan, hydrALAZINE, HYDROmorphone (DILAUDID) injection, labetalol, magnesium citrate, magnesium sulfate bolus IVPB, metoprolol tartrate, metoprolol tartrate, morphine injection, ondansetron, oxyCODONE, oxyCODONE, phenol, polyethylene glycol, potassium chloride, promethazine **OR** promethazine (PHENERGAN) injection (IM or IVPB) **OR** promethazine, senna-docusate, simethicone   Vital Signs    Vitals:   03/25/23 2000 03/26/23 0000 03/26/23 0320 03/26/23 0808  BP: (!) 145/85 132/72 (!) 117/91 136/81  Pulse: 95  96 92  Resp:  17 17 18  (!) 21  Temp:  98.9 F (37.2 C) 98.5 F (36.9 C) 98.5 F (36.9 C)  TempSrc:  Oral Oral Oral  SpO2: 98%  98%   Weight:      Height:        Intake/Output Summary (Last 24 hours) at 03/26/2023 1036 Last data filed at 03/25/2023 1330 Gross per 24 hour  Intake 400 ml  Output 155 ml  Net 245 ml      03/25/2023   10:34 AM 03/23/2023    2:42 AM 03/22/2023    7:32 PM  Last 3 Weights  Weight (lbs) 275 lb 299 lb 13.2 oz 299 lb 9.7 oz  Weight (kg) 124.739 kg 136 kg 135.9 kg      Telemetry    NSR - Personally Reviewed  ECG    No new EKG to review - Personally Reviewed  Physical Exam   GEN: Well nourished, well developed in no acute distress HEENT: Normal NECK: No JVD; No carotid bruits LYMPHATICS: No lymphadenopathy CARDIAC:RRR, no murmurs, rubs, gallops RESPIRATORY:  Clear to auscultation without rales, wheezing or rhonchi  ABDOMEN: Soft, non-tender, non-distended MUSCULOSKELETAL:  No edema; right transmet amputation SKIN: Warm and dry NEUROLOGIC:  Alert and oriented x 3 PSYCHIATRIC:  Normal affect  Labs    High Sensitivity Troponin:  No results for input(s): "TROPONINIHS" in the last 720 hours.    Chemistry Recent Labs  Lab 03/22/23 2023 03/23/23 1610 03/24/23 0316 03/25/23 0109 03/26/23 0326  NA 130* 132* 137 134* 136  K 5.0 4.3 3.6 3.8 3.9  CL 99 102 106 102 103  CO2 11* 12* 22 24 22   GLUCOSE 405* 410* 126* 205* 210*  BUN 26* 28* 21* 19 22*  CREATININE 1.52* 1.70* 1.41* 1.27* 1.46*  CALCIUM 8.7* 8.7* 8.2* 7.8* 8.1*  PROT 8.1 7.1 6.7  --   --   ALBUMIN 2.1* 1.8* 1.6*  --   --   AST 14* 11* 14*  --   --   ALT 15 13 14   --   --   ALKPHOS 64 56 50  --   --   BILITOT 1.2 1.1 0.4  --   --   GFRNONAA 55* 48* >60 >60 58*  ANIONGAP 20* 18* 9 8 11      Hematology Recent Labs  Lab 03/24/23 0316 03/25/23 0109 03/26/23 0326  WBC 17.1* 15.5* 13.2*  RBC 3.09* 2.83* 2.92*  HGB 8.5* 7.7* 7.8*  HCT 26.0* 23.6* 24.5*  MCV 84.1 83.4 83.9  MCH 27.5 27.2  26.7  MCHC 32.7 32.6 31.8  RDW 14.6 14.6 15.0  PLT 465* 461* 470*    BNP Recent Labs  Lab 03/23/23 1859 03/25/23 0819 03/26/23 0326  BNP 462.8* 524.5* 218.5*     DDimer No results for input(s): "DDIMER" in the last 168 hours.   Radiology    DG Chest Port 1 View  Result Date: 03/25/2023 CLINICAL DATA:  Shortness of breath. EXAM: PORTABLE CHEST 1 VIEW COMPARISON:  03/23/2023 FINDINGS: Low volume film. Cardiopericardial silhouette is at upper limits of normal for size. There is pulmonary vascular congestion. Persistent diffuse interstitial prominence may be chronic but component of interstitial edema is not excluded. No dense focal airspace consolidation or evidence of pleural effusion. IMPRESSION: Low volume film with pulmonary vascular congestion and diffuse interstitial prominence. Interstitial edema not excluded. Electronically Signed   By: Kennith Center M.D.   On: 03/25/2023 06:14   CT Angio Chest Pulmonary Embolism (PE) W or WO Contrast  Result Date: 03/24/2023 CLINICAL DATA:  Concern for pulmonary embolism. EXAM: CT ANGIOGRAPHY CHEST WITH CONTRAST TECHNIQUE: Multidetector CT imaging of the chest was performed using the standard protocol during bolus administration of intravenous contrast. Multiplanar CT image reconstructions and MIPs were obtained to evaluate the vascular anatomy. RADIATION DOSE REDUCTION: This exam was performed according to the departmental dose-optimization program which includes automated exposure control, adjustment of the mA and/or kV according to patient size and/or use of iterative reconstruction technique. CONTRAST:  75mL OMNIPAQUE IOHEXOL 350 MG/ML SOLN COMPARISON:  Chest CT dated 05/23/2019. FINDINGS: Cardiovascular: There is no cardiomegaly or pericardial effusion. There is coronary vascular calcification. The thoracic aorta is unremarkable. Evaluation of the pulmonary arteries is limited due to respiratory motion. No pulmonary artery embolus identified.  Mediastinum/Nodes: No hilar or mediastinal adenopathy. The esophagus is grossly unremarkable. No mediastinal fluid collection. Lungs/Pleura: Small bilateral pleural effusions. Minimal compressive atelectasis of the lung bases. Pneumonia is not excluded. There is no pneumothorax. The central airways are patent. Upper Abdomen: No acute abnormality. Musculoskeletal: Mild subcutaneous edema. No acute osseous pathology. Review of the MIP images confirms the above findings. IMPRESSION: 1. No CT evidence of pulmonary embolism. 2. Small bilateral pleural effusions with minimal compressive atelectasis of the lung bases. Pneumonia is not excluded. Electronically Signed   By: Elgie Collard M.D.   On: 03/24/2023 20:06   ECHOCARDIOGRAM COMPLETE  Result Date: 03/24/2023    ECHOCARDIOGRAM REPORT   Patient Name:   [facility]  Date of Exam: 03/24/2023 Medical Rec #:  [id number]    Height:       71.0 in Accession #:    [id number]   Weight:       299.8 lb Date of Birth:  May 10, 1971     BSA:          2.504 m Patient Age:    52 years     BP:           114/53 mmHg Patient Gender: M            HR:           129 bpm. Exam Location:  Inpatient Procedure: 2D Echo, Cardiac Doppler, Color Doppler and Intracardiac            Opacification Agent Indications:    R00.8 Other abnormalities of heart beat  History:        Patient has prior history of Echocardiogram examinations, most                 recent 08/15/2022. Arrythmias:Atrial Fibrillation; Risk                 Factors:Diabetes, Dyslipidemia and Hypertension.  Sonographer:    Milbert Coulter Referring Phys: [id number] A CALDWELL POWELL JR  Sonographer Comments: Patient is obese. Image acquisition challenging due to patient body habitus. IMPRESSIONS  1. Left ventricular ejection fraction, by estimation, is 70 to 75%. The left ventricle has hyperdynamic function. The left ventricle has no regional wall motion abnormalities. Left ventricular diastolic parameters are indeterminate.  2. Right  ventricular systolic function is hyperdynamic. The right ventricular size is severely enlarged.  3. The mitral valve was not well visualized. No evidence of mitral valve regurgitation. No evidence of mitral stenosis.  4. The aortic valve was not well visualized. Aortic valve regurgitation is not visualized.  5. The inferior vena cava is normal in size with greater than 50% respiratory variability, suggesting right atrial pressure of 3 mmHg. Comparison(s): Prior images reviewed side by side. Function is now hyperdynamic. Heart rate notably faster. FINDINGS  Left Ventricle: Left ventricular ejection fraction, by estimation, is 70 to 75%. The left ventricle has hyperdynamic function. The left ventricle has no regional wall motion abnormalities. Definity contrast agent was given IV to delineate the left ventricular endocardial borders. The left ventricular internal cavity size was normal in size. Suboptimal image quality limits for assessment of left ventricular hypertrophy. Left ventricular diastolic parameters are indeterminate. Right Ventricle: The right ventricular size is severely enlarged. Right vetricular wall thickness was not well visualized. Right ventricular systolic function is hyperdynamic. Left Atrium: Left atrial size was not well visualized. Right Atrium: Right atrial size was not well visualized. Pericardium: There is no evidence of pericardial effusion. Mitral Valve: The mitral valve was not well visualized. No evidence of mitral valve regurgitation. No evidence of mitral valve stenosis. Tricuspid Valve: The tricuspid valve is normal in structure. Tricuspid valve regurgitation is not demonstrated. No evidence of tricuspid stenosis. Aortic Valve: The aortic valve was not well visualized. Aortic valve regurgitation is not visualized. Aortic valve mean gradient measures 5.0 mmHg. Aortic valve peak gradient measures 8.3 mmHg. Aortic valve area, by VTI measures 2.61 cm. Pulmonic Valve: The pulmonic valve  was not well visualized. Pulmonic valve regurgitation is not visualized. No evidence of pulmonic stenosis. Aorta: The aortic root is normal in size and structure. Venous: The inferior vena cava is normal in size with greater than 50% respiratory variability, suggesting right atrial pressure  of 3 mmHg. IAS/Shunts: The interatrial septum was not well visualized.  LEFT VENTRICLE PLAX 2D LVIDd:         4.00 cm LVIDs:         2.50 cm LV PW:         1.10 cm LV IVS:        1.20 cm LVOT diam:     1.90 cm LV SV:         48 LV SV Index:   19 LVOT Area:     2.84 cm  RIGHT VENTRICLE RV S prime:     14.40 cm/s TAPSE (M-mode): 2.1 cm LEFT ATRIUM             Index        RIGHT ATRIUM           Index LA diam:        3.50 cm 1.40 cm/m   RA Area:     17.50 cm LA Vol (A2C):   35.7 ml 14.25 ml/m  RA Volume:   47.80 ml  19.09 ml/m LA Vol (A4C):   37.9 ml 15.13 ml/m LA Biplane Vol: 39.7 ml 15.85 ml/m  AORTIC VALVE AV Area (Vmax):    2.64 cm AV Area (Vmean):   2.46 cm AV Area (VTI):     2.61 cm AV Vmax:           144.00 cm/s AV Vmean:          97.900 cm/s AV VTI:            0.185 m AV Peak Grad:      8.3 mmHg AV Mean Grad:      5.0 mmHg LVOT Vmax:         134.00 cm/s LVOT Vmean:        84.900 cm/s LVOT VTI:          0.170 m LVOT/AV VTI ratio: 0.92  SHUNTS Systemic VTI:  0.17 m Systemic Diam: 1.90 cm Riley Lam MD Electronically signed by Riley Lam MD Signature Date/Time: 03/24/2023/11:49:21 AM    Final     Patient Profile     52 y.o. male  with a hx of type 2 diabetes, hypertension, hyperlipidemia, depression, who is being seen 03/24/2023 for the evaluation of SVT at the request of Dr. Lowell Guitar.   Assessment & Plan    Tachycardia -3 twelve-lead EKGs were personally reviewed and all demonstrate tachycardia that appears to be sinus tachycardia EKG #1 on 03/23/2023 showed SVT at 130 bpm but when looking closely there appears to be P waves in the prior T wave with long PR interval and likely represent sinus  tachycardia EKG #2 on 03/24/2023 at 8:18 AM clearly demonstrates sinus tachycardia at 122 bpm EKG #3 done 03/24/2023 at 1639 demonstrates SVT at 140 bpm with what appears to be some P waves noted in the T waves and still think it sinus tachycardia   - He told me that whenever he gets any type of infection he has a rapid heart rate and then gets anxious on top of it and has to come to the hospital and once the infection clears his heart rate goes back to normal.   - I personally reviewed the 2D echo and the RV does appear generous although I do not think it is severely enlarged.  TAPSE is normal consistent with normal RV function.   -VQ scan showed no obvious PE and Chest CTA with no PE -HR has now  normalized and in NSR that occurred shortly after his right transmet ampu and has remained in NSR since then -recommend consolidating BB toToprol XL to 50mg  daily -will get 2 week ziopatch outpt to assess HR control -no further recs at this time  [facility] will sign off.   Medication Recommendations:  Lopressor 37.5mg  BID Other recommendations (labs, testing, etc):  none Follow up as an outpatient:  Followup with extender in our office in 2 weeks      For questions or updates, please contact [facility] Please consult [email or web address] for contact info under        Signed, Armanda Magic, MD  03/26/2023, 10:36 AM

## 2023-03-26 NOTE — Evaluation (Signed)
Occupational Therapy Evaluation Patient Details Name: Taylor Hughes MRN: [id number] DOB: 1971-04-05 Today's Date: 03/26/2023   History of Present Illness 52 year old male presents 03/23/23 with infection of his right lower extremity. 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   Clinical Impression   Pt s/p above diagnosis. Pt c/o pain to RLE, anxious at beginning of session prior to activity. Pt lives at home with wife, 1 story apartment, sidewalk with 4 steps to enter apartment, no rails, no DME at home. Pt currently requires significant assistance for LB ADLs, standing, not able to WB on RLE, stood at bedside with min A from elevated surface and max effort from Pt. Pt would benefit greatly from continued acute therapy to instruct on compensatory strategies for ADLs, improve with transfers/ambulation, postacute rehab <3hrs/day recommended at this time unless improves to ambulatory level prior to DC.       If plan is discharge home, recommend the following: A lot of help with walking and/or transfers;A lot of help with bathing/dressing/bathroom;Assistance with cooking/housework;Assist for transportation;Help with stairs or ramp for entrance    Functional Status Assessment  Patient has had a recent decline in their functional status and demonstrates the ability to make significant improvements in function in a reasonable and predictable amount of time.  Equipment Recommendations  Other (comment) (TBD)    Recommendations for Other Services       Precautions / Restrictions Precautions Precautions: Fall Precaution Comments: R post-op shoe Restrictions Weight Bearing Restrictions: Yes RLE Weight Bearing: Non weight bearing Other Position/Activity Restrictions: able to put weight on it for ambulation with post op shoe      Mobility Bed Mobility Overal bed mobility: Needs Assistance Bed Mobility: Supine to  Sit, Sit to Supine     Supine to sit: Min assist Sit to supine: Min assist   General bed mobility comments: min A, increased time/effort    Transfers Overall transfer level: Needs assistance   Transfers: Sit to/from Stand Sit to Stand: Min assist, From elevated surface           General transfer comment: min A and max effort from Pt using RW, Pt motivated to complete STS on his own      Balance Overall balance assessment: Needs assistance Sitting-balance support: Feet supported Sitting balance-Leahy Scale: Fair Sitting balance - Comments: EOB   Standing balance support: During functional activity, Reliant on assistive device for balance Standing balance-Leahy Scale: Poor Standing balance comment: reliant on RW, not able to bear weight on RLE                           ADL either performed or assessed with clinical judgement   ADL Overall ADL's : Needs assistance/impaired Eating/Feeding: Independent;Sitting   Grooming: Set up;Bed level   Upper Body Bathing: Minimal assistance;Sitting   Lower Body Bathing: Moderate assistance;Sitting/lateral leans   Upper Body Dressing : Set up;Sitting   Lower Body Dressing: Maximal assistance;Sit to/from stand                 General ADL Comments: Pt requires assistance with LB ADLs, able to stand at bedside with min A, RW and max effort, reliant on RW for support.     Vision Baseline Vision/History: 1 Wears glasses Ability to See in Adequate Light: 0 Adequate Patient Visual Report: No change from baseline       Perception  Praxis         Pertinent Vitals/Pain Pain Assessment Pain Assessment: 0-10 Pain Score: 4  Pain Location: R foot Pain Descriptors / Indicators: Aching, Operative site guarding Pain Intervention(s): Monitored during session     Extremity/Trunk Assessment Upper Extremity Assessment Upper Extremity Assessment: Overall WFL for tasks assessed   Lower Extremity  Assessment Lower Extremity Assessment: Defer to PT evaluation       Communication Communication Communication: No apparent difficulties   Cognition Arousal: Alert Behavior During Therapy: WFL for tasks assessed/performed, Anxious Overall Cognitive Status: Within Functional Limits for tasks assessed                                 General Comments: Pt somewhat anxious at beginning of session, but actively participate in therapy     General Comments  Pt IV on RUE pulled out slightly when returning to bed, nursing informed and replaced IV.    Exercises     Shoulder Instructions      Home Living Family/patient expects to be discharged to:: Private residence Living Arrangements: Other relatives Available Help at Discharge: Family;Available PRN/intermittently Type of Home: House Home Access: Stairs to enter [employer] of Steps: 4 Entrance Stairs-Rails: None Home Layout: One level     Bathroom Shower/Tub: Tub/shower unit         Home Equipment: None   Additional Comments: Pt states he has no DME at home      Prior Functioning/Environment Prior Level of Function : Independent/Modified Independent                        OT Problem List: Decreased strength;Decreased range of motion;Decreased activity tolerance;Impaired balance (sitting and/or standing);Decreased knowledge of use of DME or AE;Pain;Obesity      OT Treatment/Interventions: Self-care/ADL training;Therapeutic exercise;Energy conservation;DME and/or AE instruction;Therapeutic activities;Patient/family education    OT Goals(Current goals can be found in the care plan section) Acute Rehab OT Goals Patient Stated Goal: to manage pain OT Goal Formulation: With patient/family Time For Goal Achievement: 04/09/23 Potential to Achieve Goals: Good  OT Frequency: Min 1X/week    Co-evaluation              AM-PAC OT "6 Clicks" Daily Activity     Outcome Measure Help from  another person eating meals?: None Help from another person taking care of personal grooming?: A Little Help from another person toileting, which includes using toliet, bedpan, or urinal?: A Lot Help from another person bathing (including washing, rinsing, drying)?: A Lot Help from another person to put on and taking off regular upper body clothing?: A Little Help from another person to put on and taking off regular lower body clothing?: A Lot 6 Click Score: 16   End of Session Equipment Utilized During Treatment: Gait belt;Rolling walker (2 wheels) Nurse Communication: Mobility status;Other (comment) (need for new IV, pulled out slightly when lying back down)  Activity Tolerance: Patient tolerated treatment well Patient left: in bed;with call bell/phone within reach;with family/visitor present  OT Visit Diagnosis: Unsteadiness on feet (R26.81);Other abnormalities of gait and mobility (R26.89);Muscle weakness (generalized) (M62.81);Pain Pain - Right/Left: Right Pain - part of body: Ankle and joints of foot                Time: 1610-9604 OT Time Calculation (min): 39 min Charges:  OT General Charges $OT Visit: 1 Visit OT Evaluation $OT Eval Moderate Complexity:  1 Mod OT Treatments $Self Care/Home Management : 8-22 mins $Therapeutic Activity: 8-22 mins  Coretha Creswell, OTR/L   [name] 03/26/2023, 2:04 PM

## 2023-03-26 NOTE — Telephone Encounter (Signed)
Copied from CRM [id number]. Topic: General - Inquiry >> Mar 26, 2023  8:59 AM Payton Doughty wrote: Reason for CRM: pt's fiance states pt had 1/2 his right foot amputated.  She would like a call from the dr to discuss pt going on 100% disability.

## 2023-03-26 NOTE — Progress Notes (Unsigned)
Enrolled for [employer] to mail a ZIO XT long term holter monitor to the patients address on file.   Dr. Turner to read. 

## 2023-03-26 NOTE — Progress Notes (Signed)
PROGRESS NOTE                                                                                                                                                                                                             Patient Demographics:    Taylor Hughes, is a 52 y.o. male, DOB - [birth year], NGE:[id number]  Outpatient Primary MD for the patient is Hoy Register, MD    LOS - 3  Admit date - 03/22/2023    Chief Complaint  Patient presents with   Wound Infection   Hyperglycemia       Brief Narrative (HPI from H&P)   52 year old male with past medical history of T2DM, peripheral neuropathy, atrial fibrillation, HTN, and GERD. He presents with infection of his right lower extremity . Admitted with diabetic foot infection/osteo.  He was seen by orthopedics and cardiology, transferred to my service on 03/22/2023.   Subjective:   Patient in bed, appears comfortable, denies any headache, no fever, no chest pain or pressure, no shortness of breath , no abdominal pain. No focal weakness.   Assessment  & Plan :    Sepsis due to right diabetic foot ulcer present on admission.  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   Intermittent SVT and sinus tachycardia.  Due to #1 above.  Echocardiogram noted with preserved left sided function, stable TSH, CTA negative for PE, after amputation and supportive care for infection tachycardia much improved.  Right lower extremity edema.  Present for the last month secondary to the infection, lower  extremity venous duplex although limited but negative.  Essential hypertension.  Holding home dose Lasix and Diovan.  AKI.  Due to sepsis.  Hydrate and monitor hold nephrotoxins.  Monitor trend, renal ultrasound stable.  Anemia.  Combination of heme dilution, sepsis and blood draw, check anemia panel, type screen and monitor.  Agreeable for transfusion if needed.    Dyslipidemia.  On statin.  Vomiting due to gastroparesis.  Much improved with supportive care.  Morbid obesity.  BMI 41.  Follow-up  with PCP for weight loss.  DM type II.  On Lantus and SSI.  Monitor. Lab Results  Component Value Date   HGBA1C 9.8 (H) 03/23/2023   CBG (last 3)  Recent Labs    03/25/23 1645 03/25/23 2100 03/26/23 0834  GLUCAP 117* 175* 223*         Condition - Fair  Family Communication  :  None  Code Status :  Full  Consults  :  Cards, Ortho, ID  PUD Prophylaxis : PPI   Procedures  :     Right foot transmetatarsal amputation by Dr. [name] on 03/25/2023.  Echocardiogram.  1. Left ventricular ejection fraction, by estimation, is 70 to 75%. The left ventricle has hyperdynamic function. The left ventricle has no regional wall motion abnormalities. Left ventricular diastolic parameters are indeterminate.  2. Right ventricular systolic function is hyperdynamic. The right ventricular size is severely enlarged.  3. The mitral valve was not well visualized. No evidence of mitral valve regurgitation. No evidence of mitral stenosis.  4. The aortic valve was not well visualized. Aortic valve regurgitation is not visualized.  5. The inferior vena cava is normal in size with greater than 50% respiratory variability, suggesting right atrial pressure of 3 mmHg.    CTA chest. 1. No CT evidence of pulmonary embolism. 2. Small bilateral pleural effusions with minimal compressive atelectasis of the lung bases. Pneumonia is not excluded.  Renal ultrasound.  Nonacute.    Lower extremity venous duplex.  No DVT.     ABI - Right: Resting right ankle-brachial index indicates mild right lower extremity arterial disease. Left: Resting left ankle-brachial index is within normal range. *See table(s) above for measurements and observations.      Disposition Plan  :    Status is: Inpatient   DVT Prophylaxis  :    SCD's Start: 03/25/23 1501 heparin injection 5,000 Units Start: 03/23/23 1800    Lab Results  Component Value Date   PLT 470 (H) 03/26/2023    Diet :  Diet Order             Diet Carb Modified Fluid consistency: Thin; Room service appropriate? Yes  Diet effective now                    Inpatient Medications  Scheduled Meds:  vitamin C  1,000 mg Oral Daily   aspirin EC  81 mg Oral Daily   atorvastatin  20 mg Oral Daily   buPROPion  150 mg Oral Daily   docusate sodium  100 mg Oral Daily   gabapentin  300 mg Oral BID   heparin injection (subcutaneous)  5,000 Units Subcutaneous Q8H   insulin aspart  0-15 Units Subcutaneous Q4H   insulin glargine-yfgn  20 Units Subcutaneous QHS   metoCLOPramide (REGLAN) injection  10 mg Intravenous Q6H   metoprolol tartrate  37.5 mg Oral BID   metroNIDAZOLE  500 mg Oral Q12H   midodrine  10 mg Oral TID WC   mupirocin ointment  1 Application Nasal BID   nutrition supplement (JUVEN)  1 packet Oral BID BM   pantoprazole  40 mg Oral Daily   pantoprazole (PROTONIX) IV  40 mg Intravenous Q24H   zinc sulfate  220 mg Oral Daily   Continuous Infusions:  ceFEPime (MAXIPIME) IV 2 g (03/26/23 0559)   magnesium sulfate bolus IVPB     potassium PHOSPHATE IVPB (in mmol)     promethazine (PHENERGAN) injection (IM or IVPB)  vancomycin 1,250 mg (03/26/23 0128)   PRN Meds:.acetaminophen **OR** acetaminophen, acetaminophen, alum & mag hydroxide-simeth, bisacodyl, guaiFENesin-dextromethorphan, hydrALAZINE, HYDROmorphone (DILAUDID) injection, labetalol, magnesium citrate, magnesium sulfate bolus IVPB, metoprolol tartrate, metoprolol tartrate, morphine  injection, ondansetron, oxyCODONE, oxyCODONE, phenol, polyethylene glycol, potassium chloride, promethazine **OR** promethazine (PHENERGAN) injection (IM or IVPB) **OR** promethazine, senna-docusate, simethicone    Objective:   Vitals:   03/25/23 2000 03/26/23 0000 03/26/23 0320 03/26/23 0808  BP: (!) 145/85 132/72 (!) 117/91 136/81  Pulse: 95  96 92  Resp: 17 17 18  (!) 21  Temp:  98.9 F (37.2 C) 98.5 F (36.9 C) 98.5 F (36.9 C)  TempSrc:  Oral Oral Oral  SpO2: 98%  98%   Weight:      Height:        Wt Readings from Last 3 Encounters:  03/25/23 124.7 kg  01/19/23 135.9 kg  09/08/22 133.1 kg     Intake/Output Summary (Last 24 hours) at 03/26/2023 1006 Last data filed at 03/25/2023 1330 Gross per 24 hour  Intake 400 ml  Output 330 ml  Net 70 ml     Physical Exam  Awake Alert, No new F.N deficits, Normal affect [place].AT,PERRAL Supple Neck, No JVD,   Symmetrical Chest wall movement, Good air movement bilaterally, CTAB RRR,No Gallops,Rubs or new Murmurs,  +ve B.Sounds, Abd Soft, No tenderness,   L TMA under bandage - W.Vac    Data Review:    Recent Labs  Lab 03/22/23 2023 03/23/23 0005 03/23/23 0227 03/24/23 0316 03/25/23 0109 03/26/23 0326  WBC 17.9* 19.8* 19.1* 17.1* 15.5* 13.2*  HGB 9.3* 8.6* 8.2* 8.5* 7.7* 7.8*  HCT 29.9* 27.3* 25.8* 26.0* 23.6* 24.5*  PLT 539* 497* 469* 465* 461* 470*  MCV 87.9 86.7 85.4 84.1 83.4 83.9  MCH 27.4 27.3 27.2 27.5 27.2 26.7  MCHC 31.1 31.5 31.8 32.7 32.6 31.8  RDW 15.1 15.2 14.9 14.6 14.6 15.0  LYMPHSABS 1.0  --  0.9 1.4  --  1.7  MONOABS 0.9  --  1.0 1.3*  --  1.0  EOSABS 0.1  --  0.0 0.0  --  0.2  BASOSABS 0.0  --  0.0 0.0  --  0.0    Recent Labs  Lab 03/22/23 2023 03/22/23 2151 03/23/23 0005 03/23/23 0227 03/23/23 1859 03/24/23 0316 03/25/23 0109 03/25/23 0819 03/26/23 0326  NA 130*  --   --  132*  --  137 134*  --  136  K 5.0  --   --  4.3  --  3.6 3.8  --  3.9  CL 99  --   --  102  --  106 102  --   103  CO2 11*  --   --  12*  --  22 24  --  22  ANIONGAP 20*  --   --  18*  --  9 8  --  11  GLUCOSE 405*  --   --  410*  --  126* 205*  --  210*  BUN 26*  --   --  28*  --  21* 19  --  22*  CREATININE 1.52*  --   --  1.70*  --  1.41* 1.27*  --  1.46*  AST 14*  --   --  11*  --  14*  --   --   --   ALT 15  --   --  13  --  14  --   --   --  ALKPHOS 64  --   --  56  --  50  --   --   --   BILITOT 1.2  --   --  1.1  --  0.4  --   --   --   ALBUMIN 2.1*  --   --  1.8*  --  1.6*  --   --   --   CRP  --  30.8*  --   --   --   --   --  27.2* 25.5*  PROCALCITON  --   --   --   --   --   --   --  0.62 0.60  LATICACIDVEN  --  1.3 1.3 1.2  --   --   --   --   --   INR  --   --   --   --   --   --   --  1.2  --   TSH  --   --   --   --   --  1.058  --   --   --   HGBA1C  --   --  9.8*  --   --   --   --   --   --   BNP  --   --   --   --  462.8*  --   --  524.5* 218.5*  MG  --   --   --  1.9  --  1.7  --   --  1.9  CALCIUM 8.7*  --   --  8.7*  --  8.2* 7.8*  --  8.1*      Recent Labs  Lab 03/22/23 2023 03/22/23 2151 03/23/23 0005 03/23/23 0227 03/23/23 1859 03/24/23 0316 03/25/23 0109 03/25/23 0819 03/26/23 0326  CRP  --  30.8*  --   --   --   --   --  27.2* 25.5*  PROCALCITON  --   --   --   --   --   --   --  0.62 0.60  LATICACIDVEN  --  1.3 1.3 1.2  --   --   --   --   --   INR  --   --   --   --   --   --   --  1.2  --   TSH  --   --   --   --   --  1.058  --   --   --   HGBA1C  --   --  9.8*  --   --   --   --   --   --   BNP  --   --   --   --  462.8*  --   --  524.5* 218.5*  MG  --   --   --  1.9  --  1.7  --   --  1.9  CALCIUM 8.7*  --   --  8.7*  --  8.2* 7.8*  --  8.1*    Micro Results Recent Results (from the past 240 hour(s))  Blood culture (routine x 2)     Status: None (Preliminary result)   Collection Time: 03/22/23  8:23 PM   Specimen: BLOOD RIGHT HAND  Result Value Ref Range Status   Specimen Description BLOOD RIGHT HAND  Final   Special Requests   Final     BOTTLES DRAWN AEROBIC AND ANAEROBIC  Blood Culture adequate volume   Culture   Final    NO GROWTH 4 DAYS Performed at [facility], [address], [place], [place] [address]    Report Status PENDING  Incomplete  Blood culture (routine x 2)     Status: None (Preliminary result)   Collection Time: 03/22/23  9:09 PM   Specimen: BLOOD  Result Value Ref Range Status   Specimen Description BLOOD SITE NOT SPECIFIED  Final   Special Requests   Final    BOTTLES DRAWN AEROBIC ONLY Blood Culture adequate volume   Culture   Final    NO GROWTH 4 DAYS Performed at [facility], [address], [place], [place] [address]    Report Status PENDING  Incomplete  SARS Coronavirus 2 by RT PCR (hospital order, performed in [facility] hospital lab) *cepheid single result test* Anterior Nasal Swab     Status: None   Collection Time: 03/23/23  5:20 PM   Specimen: Anterior Nasal Swab  Result Value Ref Range Status   SARS Coronavirus 2 by RT PCR NEGATIVE NEGATIVE Final    Comment: Performed at [facility], [address] [place], [place] [address]  Surgical PCR screen     Status: Abnormal   Collection Time: 03/24/23  5:30 PM   Specimen: Nasal Mucosa; Nasal Swab  Result Value Ref Range Status   MRSA, PCR NEGATIVE NEGATIVE Final   Staphylococcus aureus POSITIVE (A) NEGATIVE Final    Comment: (NOTE) The Xpert SA Assay (FDA approved for NASAL specimens in patients 73 years of age and older), is one component of a comprehensive surveillance program. It is not intended to diagnose infection nor to guide or monitor treatment. Performed at [facility], [address], [place], [place] [address]   MRSA Next Gen by PCR, Nasal     Status: None   Collection Time: 03/25/23  6:40 AM   Specimen: Nasal Mucosa; Nasal Swab  Result Value Ref Range Status   MRSA by PCR Next Gen NOT DETECTED NOT DETECTED Final    Comment: (NOTE) The GeneXpert MRSA Assay (FDA approved for NASAL specimens  only), is one component of a comprehensive MRSA colonization surveillance program. It is not intended to diagnose MRSA infection nor to guide or monitor treatment for MRSA infections. Test performance is not FDA approved in patients less than 46 years old. Performed at [facility], [address], [place], [place] [address]   Aerobic/Anaerobic Culture w Gram Stain (surgical/deep wound)     Status: None (Preliminary result)   Collection Time: 03/25/23 12:30 PM   Specimen: Soft Tissue, Other  Result Value Ref Range Status   Specimen Description TISSUE RIGHT TRANSMETATARSAL  Final   Special Requests A  Final   Gram Stain NO WBC SEEN RARE GRAM POSITIVE COCCI IN PAIRS   Final   Culture   Final    NO GROWTH < 24 HOURS Performed at [facility], [address], [place], [place] [address]    Report Status PENDING  Incomplete    Radiology Reports DG Chest Port 1 View  Result Date: 03/25/2023 CLINICAL DATA:  Shortness of breath. EXAM: PORTABLE CHEST 1 VIEW COMPARISON:  03/23/2023 FINDINGS: Low volume film. Cardiopericardial silhouette is at upper limits of normal for size. There is pulmonary vascular congestion. Persistent diffuse interstitial prominence may be chronic but component of interstitial edema is not excluded. No dense focal airspace consolidation or evidence of pleural  effusion. IMPRESSION: Low volume film with pulmonary vascular congestion and diffuse interstitial prominence. Interstitial edema not excluded. Electronically Signed   By: Kennith Center M.D.   On: 03/25/2023 06:14   CT Angio Chest Pulmonary Embolism (PE) W or WO Contrast  Result Date: 03/24/2023 CLINICAL DATA:  Concern for pulmonary embolism. EXAM: CT ANGIOGRAPHY CHEST WITH CONTRAST TECHNIQUE: Multidetector CT imaging of the chest was performed using the standard protocol during bolus administration of intravenous contrast. Multiplanar CT image reconstructions and MIPs were obtained to evaluate the vascular  anatomy. RADIATION DOSE REDUCTION: This exam was performed according to the departmental dose-optimization program which includes automated exposure control, adjustment of the mA and/or kV according to patient size and/or use of iterative reconstruction technique. CONTRAST:  75mL OMNIPAQUE IOHEXOL 350 MG/ML SOLN COMPARISON:  Chest CT dated 05/23/2019. FINDINGS: Cardiovascular: There is no cardiomegaly or pericardial effusion. There is coronary vascular calcification. The thoracic aorta is unremarkable. Evaluation of the pulmonary arteries is limited due to respiratory motion. No pulmonary artery embolus identified. Mediastinum/Nodes: No hilar or mediastinal adenopathy. The esophagus is grossly unremarkable. No mediastinal fluid collection. Lungs/Pleura: Small bilateral pleural effusions. Minimal compressive atelectasis of the lung bases. Pneumonia is not excluded. There is no pneumothorax. The central airways are patent. Upper Abdomen: No acute abnormality. Musculoskeletal: Mild subcutaneous edema. No acute osseous pathology. Review of the MIP images confirms the above findings. IMPRESSION: 1. No CT evidence of pulmonary embolism. 2. Small bilateral pleural effusions with minimal compressive atelectasis of the lung bases. Pneumonia is not excluded. Electronically Signed   By: Elgie Collard M.D.   On: 03/24/2023 20:06   ECHOCARDIOGRAM COMPLETE  Result Date: 03/24/2023    ECHOCARDIOGRAM REPORT   Patient Name:   KAYD BEVERIDGE Date of Exam: 03/24/2023 Medical Rec #:  [id number]    Height:       71.0 in Accession #:    [id number]   Weight:       299.8 lb Date of Birth:  1970-11-24     BSA:          2.504 m Patient Age:    52 years     BP:           114/53 mmHg Patient Gender: M            HR:           129 bpm. Exam Location:  Inpatient Procedure: 2D Echo, Cardiac Doppler, Color Doppler and Intracardiac            Opacification Agent Indications:    R00.8 Other abnormalities of heart beat  History:        Patient has  prior history of Echocardiogram examinations, most                 recent 08/15/2022. Arrythmias:Atrial Fibrillation; Risk                 Factors:Diabetes, Dyslipidemia and Hypertension.  Sonographer:    Milbert Coulter Referring Phys: [phone] A CALDWELL POWELL JR  Sonographer Comments: Patient is obese. Image acquisition challenging due to patient body habitus. IMPRESSIONS  1. Left ventricular ejection fraction, by estimation, is 70 to 75%. The left ventricle has hyperdynamic function. The left ventricle has no regional wall motion abnormalities. Left ventricular diastolic parameters are indeterminate.  2. Right ventricular systolic function is hyperdynamic. The right ventricular size is severely enlarged.  3. The mitral valve was not well visualized. No evidence of mitral valve regurgitation. No evidence of mitral stenosis.  4. The aortic valve was not well visualized. Aortic valve regurgitation is not visualized.  5. The inferior vena cava is normal in size with greater than 50% respiratory variability, suggesting right atrial pressure of 3 mmHg. Comparison(s): Prior images reviewed side by side. Function is now hyperdynamic. Heart rate notably faster. FINDINGS  Left Ventricle: Left ventricular ejection fraction, by estimation, is 70 to 75%. The left ventricle has hyperdynamic function. The left ventricle has no regional wall motion abnormalities. Definity contrast agent was given IV to delineate the left ventricular endocardial borders. The left ventricular internal cavity size was normal in size. Suboptimal image quality limits for assessment of left ventricular hypertrophy. Left ventricular diastolic parameters are indeterminate. Right Ventricle: The right ventricular size is severely enlarged. Right vetricular wall thickness was not well visualized. Right ventricular systolic function is hyperdynamic. Left Atrium: Left atrial size was not well visualized. Right Atrium: Right atrial size was not well visualized.  Pericardium: There is no evidence of pericardial effusion. Mitral Valve: The mitral valve was not well visualized. No evidence of mitral valve regurgitation. No evidence of mitral valve stenosis. Tricuspid Valve: The tricuspid valve is normal in structure. Tricuspid valve regurgitation is not demonstrated. No evidence of tricuspid stenosis. Aortic Valve: The aortic valve was not well visualized. Aortic valve regurgitation is not visualized. Aortic valve mean gradient measures 5.0 mmHg. Aortic valve peak gradient measures 8.3 mmHg. Aortic valve area, by VTI measures 2.61 cm. Pulmonic Valve: The pulmonic valve was not well visualized. Pulmonic valve regurgitation is not visualized. No evidence of pulmonic stenosis. Aorta: The aortic root is normal in size and structure. Venous: The inferior vena cava is normal in size with greater than 50% respiratory variability, suggesting right atrial pressure of 3 mmHg. IAS/Shunts: The interatrial septum was not well visualized.  LEFT VENTRICLE PLAX 2D LVIDd:         4.00 cm LVIDs:         2.50 cm LV PW:         1.10 cm LV IVS:        1.20 cm LVOT diam:     1.90 cm LV SV:         48 LV SV Index:   19 LVOT Area:     2.84 cm  RIGHT VENTRICLE RV S prime:     14.40 cm/s TAPSE (M-mode): 2.1 cm LEFT ATRIUM             Index        RIGHT ATRIUM           Index LA diam:        3.50 cm 1.40 cm/m   RA Area:     17.50 cm LA Vol (A2C):   35.7 ml 14.25 ml/m  RA Volume:   47.80 ml  19.09 ml/m LA Vol (A4C):   37.9 ml 15.13 ml/m LA Biplane Vol: 39.7 ml 15.85 ml/m  AORTIC VALVE AV Area (Vmax):    2.64 cm AV Area (Vmean):   2.46 cm AV Area (VTI):     2.61 cm AV Vmax:           144.00 cm/s AV Vmean:          97.900 cm/s AV VTI:            0.185 m AV Peak Grad:      8.3 mmHg AV Mean Grad:      5.0 mmHg LVOT Vmax:         134.00  cm/s LVOT Vmean:        84.900 cm/s LVOT VTI:          0.170 m LVOT/AV VTI ratio: 0.92  SHUNTS Systemic VTI:  0.17 m Systemic Diam: 1.90 cm Riley Lam MD  Electronically signed by Riley Lam MD Signature Date/Time: 03/24/2023/11:49:21 AM    Final    VAS [place] ABI WITH/WO TBI  Result Date: 03/23/2023  LOWER EXTREMITY DOPPLER STUDY Patient Name:  ARNOLDO SALZMAN  Date of Exam:   03/23/2023 Medical Rec #: [id number]     Accession #:    [id number] Date of Birth: 20-May-1971      Patient Gender: M Patient Age:   68 years Exam Location:  [facility] Procedure:      VAS [place] ABI WITH/WO TBI Referring Phys: A POWELL JR --------------------------------------------------------------------------------  Indications: Ulceration, and peripheral artery disease. High Risk Factors: Diabetes.  Comparison Study: No prior study Performing Technologist: Shona Simpson  Examination Guidelines: A complete evaluation includes at minimum, Doppler waveform signals and systolic blood pressure reading at the level of bilateral brachial, anterior tibial, and posterior tibial arteries, when vessel segments are accessible. Bilateral testing is considered an integral part of a complete examination. Photoelectric Plethysmograph (PPG) waveforms and toe systolic pressure readings are included as required and additional duplex testing as needed. Limited examinations for reoccurring indications may be performed as noted.  ABI Findings: +--------+------------------+-----+---------+--------+ Right   Rt Pressure (mmHg)IndexWaveform Comment  +--------+------------------+-----+---------+--------+ [id number]                    triphasic         +--------+------------------+-----+---------+--------+ PTA     98                0.68 biphasic          +--------+------------------+-----+---------+--------+ DP      131               0.91 biphasic          +--------+------------------+-----+---------+--------+ +--------+------------------+-----+---------+-------+ Left    Lt Pressure (mmHg)IndexWaveform Comment +--------+------------------+-----+---------+-------+ [id number]                     triphasic        +--------+------------------+-----+---------+-------+ PTA     157               1.09 biphasic         +--------+------------------+-----+---------+-------+ DP      162               1.12 biphasic         +--------+------------------+-----+---------+-------+ TOES Findings:  Unable to insonate due to open wounds +---------+---------------+--------+-------+ Left ToesPressure (mmHg)WaveformComment +---------+---------------+--------+-------+ 3rd Digit               Normal          +---------+---------------+--------+-------+ Unable to assess other digits or test compression due to size/shape incompatibility with sensor   Summary: Right: Resting right ankle-brachial index indicates mild right lower extremity arterial disease. Left: Resting left ankle-brachial index is within normal range. *See table(s) above for measurements and observations.  Electronically signed by Lemar Livings MD on 03/23/2023 at 6:58:35 PM.    Final    VAS [place] LOWER EXTREMITY VENOUS (DVT)  Result Date: 03/23/2023  Lower Venous DVT Study Patient Name:  RAHEEL MILOVICH  Date of Exam:   03/23/2023 Medical Rec #: [id number]     Accession #:    [id number] Date of Birth: [birth year]  Patient Gender: M Patient Age:   32 years Exam Location:  [facility] Procedure:      VAS [place] LOWER EXTREMITY VENOUS (DVT) Referring Phys: A POWELL JR --------------------------------------------------------------------------------  Indications: Edema, and ulceration.  Risk Factors: Limited mobility. Limitations: Body habitus. Comparison Study: No significant changes seen since prior exam 10/03/21. Performing Technologist: Shona Simpson  Examination Guidelines: A complete evaluation includes B-mode imaging, spectral Doppler, color Doppler, and power Doppler as needed of all accessible portions of each vessel. Bilateral testing is considered an integral part of a complete examination. Limited examinations for  reoccurring indications may be performed as noted. The reflux portion of the exam is performed with the patient in reverse Trendelenburg.  +---------+---------------+---------+-----------+---------------+--------------+ RIGHT    CompressibilityPhasicitySpontaneityProperties     Thrombus Aging +---------+---------------+---------+-----------+---------------+--------------+ CFV      Full           Yes      Yes                                      +---------+---------------+---------+-----------+---------------+--------------+ SFJ      Full                                                             +---------+---------------+---------+-----------+---------------+--------------+ FV Prox  Full                                                             +---------+---------------+---------+-----------+---------------+--------------+ FV Mid   Full                                                             +---------+---------------+---------+-----------+---------------+--------------+ FV DistalFull                                                             +---------+---------------+---------+-----------+---------------+--------------+ PFV      Full                                                             +---------+---------------+---------+-----------+---------------+--------------+ POP      Full           Yes      Yes                                      +---------+---------------+---------+-----------+---------------+--------------+ PTV      Full                                                             +---------+---------------+---------+-----------+---------------+--------------+  PERO                                        Patent by color               +---------+---------------+---------+-----------+---------------+--------------+   +----+---------------+---------+-----------+----------+--------------+  LEFTCompressibilityPhasicitySpontaneityPropertiesThrombus Aging +----+---------------+---------+-----------+----------+--------------+ CFV Full           Yes      Yes                                 +----+---------------+---------+-----------+----------+--------------+    Summary: RIGHT: - There is no evidence of deep vein thrombosis in the lower extremity.  - No cystic structure found in the popliteal fossa.  LEFT: - No evidence of common femoral vein obstruction.  *See table(s) above for measurements and observations. Electronically signed by Lemar Livings MD on 03/23/2023 at 6:56:49 PM.    Final    NM Pulmonary Perfusion  Result Date: 03/23/2023 CLINICAL DATA:  Tachycardia, RIGHT leg swelling EXAM: NUCLEAR MEDICINE PERFUSION LUNG SCAN TECHNIQUE: Perfusion images were obtained in multiple projections after intravenous injection of radiopharmaceutical. RADIOPHARMACEUTICALS:  4.2 mCi Tc-23m MAA COMPARISON:  03/23/2023 FINDINGS: No wedge-shaped peripheral perfusion defect noted in the LEFT or RIGHT lung to suggest acute pulmonary embolism. Cardiac attenuation noted. IMPRESSION: No evidence acute pulmonary embolism. Electronically Signed   By: Genevive Bi M.D.   On: 03/23/2023 16:13   US RENAL  Result Date: 03/23/2023 CLINICAL DATA:  Acute kidney injury EXAM: RENAL / URINARY TRACT ULTRASOUND COMPLETE COMPARISON:  None Available. FINDINGS: Right Kidney: Renal measurements: 11.8 x 5.8 x 5.2 cm = volume: 183 mL. Echogenicity within normal limits. No mass or hydronephrosis visualized. Left Kidney: Renal measurements: 12.0 x 6.7 x 6.3 cm = volume: 266 mL. Echogenicity within normal limits. No mass or hydronephrosis visualized. Bladder: Not well visualized due to underdistention. Other: None. IMPRESSION: No hydronephrosis. Electronically Signed   By: Allegra Lai M.D.   On: 03/23/2023 13:49   DG Chest Port 1 View  Result Date: 03/23/2023 CLINICAL DATA:  Shortness of breath EXAM: PORTABLE  CHEST 1 VIEW COMPARISON:  08/14/2022 FINDINGS: Cardiomegaly. Diffuse bilateral interstitial pulmonary opacity. The visualized skeletal structures are unremarkable. IMPRESSION: Cardiomegaly with diffuse bilateral interstitial pulmonary opacity, consistent with edema or atypical/viral infection. No focal airspace opacity. Electronically Signed   By: Jearld Lesch M.D.   On: 03/23/2023 12:24   DG Foot Complete Right  Result Date: 03/22/2023 CLINICAL DATA:  Possible diabetic infection. EXAM: RIGHT FOOT COMPLETE - 3+ VIEW COMPARISON:  None Available. FINDINGS: Erosive changes of the distal phalanges of the first and second digits. In the absence of history of amputation findings suspicious for osteomyelitis. No acute fracture. There is no dislocation. The bones are osteopenic. Diffuse skin thickening and soft tissue edema. Dressing noted over the forefoot. IMPRESSION: Erosive changes of the distal phalanges of the first and second digits suspicious for osteomyelitis. Electronically Signed   By: Elgie Collard M.D.   On: 03/22/2023 20:11      Signature  -   Susa Raring M.D on 03/26/2023 at 10:06 AM   -  To page go to [email or web address]

## 2023-03-26 NOTE — Telephone Encounter (Signed)
Pt is needing help with applying for disability

## 2023-03-26 NOTE — Progress Notes (Signed)
Physical Therapy  Contacted Ortho Tech re: waiting for new shoe that will fit pt's ankle/foot. They do not have anything with a longer ankle strap. Checked with OT to see if we have velcro that can be used to lengthen existing strap and they do not. Contacted Ortho Tech again and they will reach out to [employer] to see if they have anything. Although pt is supposed to be NWB RLE, want to have shoe in place in case pt puts his foot down on the floor.    Jerolyn Center, PT Acute Rehabilitation Services  Office [phone]

## 2023-03-26 NOTE — Consult Note (Addendum)
Date of Admission:  03/22/2023          Reason for Consult: Osteomyelitis of right foot sp TMA   Referring Provider: Susa Raring, MD   Assessment:   Osteomyelitis of right foot sp TMA Prior chest wall abscess and sternoclavicular joint infection with MSSA and group A strep treated in 2020 For osteomyelitis of fifth digit left foot status post amputation Paroxysmal atrial fibrillation Diabetes mellitus   Plan:  Switch over to daptomycin and ceftriaxone and metronidazole Follow-up cultures from OR I would plan on him getting at least 4 weeks of systemic antibiotics with close followup with Dr. [name] and with [place] in our clinic   Taylor Hughes has an appointment on 04/16/2023 at 930AM at  [facility] for Infectious Disease, which  is located in the [facility] at 1030 AM with Dr. Daiva Eves at  [address] [place] in [place].  [address], which is located to the left of the elevators.  Phone: [phone]  Fax: [phone]  [email or web address]  The patient should arrive 30 minutes prior to their appoitment.   Principal Problem:   Diabetic foot infection (HCC) Active Problems:   Essential hypertension   Type 2 diabetes mellitus with other specified complication (HCC)   PAF (paroxysmal atrial fibrillation) (HCC)   Osteomyelitis (HCC)   HLD (hyperlipidemia)   Gastroparesis   Severe protein-calorie malnutrition (HCC)   Lymphedema   Abscess of right foot   Sinus tachycardia   Right ventricular enlargement   Scheduled Meds:  vitamin C  1,000 mg Oral Daily   aspirin EC  81 mg Oral Daily   atorvastatin  20 mg Oral Daily   buPROPion  150 mg Oral Daily   docusate sodium  100 mg Oral Daily   gabapentin  300 mg Oral BID   heparin injection (subcutaneous)  5,000 Units Subcutaneous Q8H   insulin aspart  0-15 Units Subcutaneous Q4H   insulin glargine-yfgn  20 Units Subcutaneous QHS   metoCLOPramide (REGLAN) injection   10 mg Intravenous Q6H   metoprolol tartrate  37.5 mg Oral BID   metroNIDAZOLE  500 mg Oral Q12H   midodrine  10 mg Oral TID WC   mupirocin ointment  1 Application Nasal BID   nutrition supplement (JUVEN)  1 packet Oral BID BM   pantoprazole  40 mg Oral Daily   zinc sulfate  220 mg Oral Daily   Continuous Infusions:  cefTRIAXone (ROCEPHIN)  IV 2 g (03/26/23 1535)   DAPTOmycin (CUBICIN) 750 mg in sodium chloride 0.9 % IVPB     magnesium sulfate bolus IVPB     potassium PHOSPHATE IVPB (in mmol)     promethazine (PHENERGAN) injection (IM or IVPB)     PRN Meds:.acetaminophen **OR** acetaminophen, acetaminophen, alum & mag hydroxide-simeth, bisacodyl, guaiFENesin-dextromethorphan, hydrALAZINE, HYDROmorphone (DILAUDID) injection, labetalol, magnesium citrate, magnesium sulfate bolus IVPB, metoprolol tartrate, metoprolol tartrate, morphine injection, ondansetron, oxyCODONE, oxyCODONE, phenol, polyethylene glycol, potassium chloride, promethazine **OR** promethazine (PHENERGAN) injection (IM or IVPB) **OR** promethazine, senna-docusate, simethicone  HPI: Taylor Hughes is a 52 y.o. male history of diabetes mellitus atrial fibrillation hypertension GERD who we had previously treated in 2020 for a chest wall abscess and sternoclavicular joint infection with MSSA and group A strep having been isolated. He has also had an amputation of fifth toe on the left.   Patient had developed an ulcer under his right foot that are bothering for at least a month.  His  lower extremity then became acutely painful and swollen and his wound began draining.  He then developed subjective fevers and chills and became lethargic and weak and was laying in bed for hours of time in.  Came to the ER where he was febrile and tachycardic with leukocytosis and elevated inflammatory markers plain films showed already erosive changes distal phalange of the first and second digit suspicious for osteomyelitis blood cultures were taken  and he was placed on broad-spectrum antibiotics in the form of vancomycin cefepime and metronidazole.  He has now undergone transmetatarsal amputation by Dr. [name].  Patient was found the OR to have an abscess and necrotic tissue that extended into the midfoot.  GS showed GPCC but culture not revealing yet  I changing him over to daptomycin ceftriaxone and metronidazole.  Hopefully will isolate an organism not as optimistic since he was on antibiotics for several days prior to surgery.  We have discussed options of IV versus oral antibiotics with my preference for the former if we do not isolate a specific pathogen.  I have personally spent 84 minutes involved in face-to-face and non-face-to-face activities for this patient on the day of the visit. Professional time spent includes the following activities: Preparing to see the patient (review of tests), Obtaining and/or reviewing separately obtained history (admission/discharge record), Performing a medically appropriate examination and/or evaluation , Ordering medications/tests/procedures, referring and communicating with other health care professionals, Documenting clinical information in the EMR, Independently interpreting results (not separately reported), Communicating results to the patient/family/caregiver, Counseling and educating the patient/family/caregiver and Care coordination (not separately reported).    Review of Systems: Review of Systems  Constitutional:  Negative for chills, diaphoresis, fever, malaise/fatigue and weight loss.  HENT:  Negative for congestion, hearing loss, sore throat and tinnitus.   Eyes:  Negative for blurred vision, double vision and photophobia.  Respiratory:  Negative for cough, sputum production, shortness of breath and wheezing.   Cardiovascular:  Negative for chest pain, palpitations and leg swelling.  Gastrointestinal:  Negative for abdominal pain, blood in stool, constipation, diarrhea, heartburn,  melena, nausea and vomiting.  Genitourinary:  Negative for dysuria, flank pain and hematuria.  Musculoskeletal:  Negative for back pain, falls, joint pain and myalgias.  Skin:  Negative for itching and rash.  Neurological:  Negative for dizziness, sensory change, focal weakness, loss of consciousness, weakness and headaches.  Endo/Heme/Allergies:  Does not bruise/bleed easily.  Psychiatric/Behavioral:  Negative for depression, memory loss and suicidal ideas. The patient is not nervous/anxious and does not have insomnia.     Past Medical History:  Diagnosis Date   Atrial fibrillation (HCC)    Diabetes mellitus without complication (HCC)    Type 2   GERD (gastroesophageal reflux disease)    Wears glasses    Wound, open, foot    left diabetic     Social History   Tobacco Use   Smoking status: Never   Smokeless tobacco: Never  Vaping Use   Vaping status: Never Used  Substance Use Topics   Alcohol use: No    Alcohol/week: 0.0 standard drinks of alcohol   Drug use: No    Family History  Problem Relation Age of Onset   Diabetes Mother    No Known Allergies  OBJECTIVE: Blood pressure 115/83, pulse 80, temperature 98.5 F (36.9 C), temperature source Oral, resp. rate (!) 0, height 5\' 11"  (1.803 m), weight 124.7 kg, SpO2 98%.  Physical Exam Constitutional:      Appearance: He is well-developed.  HENT:     Head: Normocephalic and atraumatic.  Eyes:     Conjunctiva/sclera: Conjunctivae normal.  Cardiovascular:     Rate and Rhythm: Normal rate and regular rhythm.  Pulmonary:     Effort: Pulmonary effort is normal. No respiratory distress.     Breath sounds: No wheezing.  Abdominal:     General: There is no distension.     Palpations: Abdomen is soft.  Musculoskeletal:     Cervical back: Normal range of motion and neck supple.  Skin:    General: Skin is warm and dry.     Coloration: Skin is not pale.     Findings: No erythema or rash.  Neurological:     General: No  focal deficit present.     Mental Status: He is alert and oriented to person, place, and time.  Psychiatric:        Mood and Affect: Mood normal.        Behavior: Behavior normal.        Thought Content: Thought content normal.        Judgment: Judgment normal.    Right foot with TMA site with vacuum dressing  Left foot    Lab Results Lab Results  Component Value Date   WBC 13.2 (H) 03/26/2023   HGB 7.8 (L) 03/26/2023   HCT 24.5 (L) 03/26/2023   MCV 83.9 03/26/2023   PLT 470 (H) 03/26/2023    Lab Results  Component Value Date   CREATININE 1.46 (H) 03/26/2023   BUN 22 (H) 03/26/2023   NA 136 03/26/2023   K 3.9 03/26/2023   CL 103 03/26/2023   CO2 22 03/26/2023    Lab Results  Component Value Date   ALT 14 03/24/2023   AST 14 (L) 03/24/2023   ALKPHOS 50 03/24/2023   BILITOT 0.4 03/24/2023     Microbiology: Recent Results (from the past 240 hour(s))  Blood culture (routine x 2)     Status: None (Preliminary result)   Collection Time: 03/22/23  8:23 PM   Specimen: BLOOD RIGHT HAND  Result Value Ref Range Status   Specimen Description BLOOD RIGHT HAND  Final   Special Requests   Final    BOTTLES DRAWN AEROBIC AND ANAEROBIC Blood Culture adequate volume   Culture   Final    NO GROWTH 4 DAYS Performed at [facility], [address] [place], [place] [address]    Report Status PENDING  Incomplete  Blood culture (routine x 2)     Status: None (Preliminary result)   Collection Time: 03/22/23  9:09 PM   Specimen: BLOOD  Result Value Ref Range Status   Specimen Description BLOOD SITE NOT SPECIFIED  Final   Special Requests   Final    BOTTLES DRAWN AEROBIC ONLY Blood Culture adequate volume   Culture   Final    NO GROWTH 4 DAYS Performed at [facility], [address] [place], [place] [address]    Report Status PENDING  Incomplete  SARS Coronavirus 2 by RT PCR (hospital order, performed in [facility] hospital lab) *cepheid single result test*  Anterior Nasal Swab     Status: None   Collection Time: 03/23/23  5:20 PM   Specimen: Anterior Nasal Swab  Result Value Ref Range Status   SARS Coronavirus 2 by RT PCR NEGATIVE NEGATIVE Final    Comment: Performed at [facility], [address] [place], [place] [address]  Surgical PCR screen  Status: Abnormal   Collection Time: 03/24/23  5:30 PM   Specimen: Nasal Mucosa; Nasal Swab  Result Value Ref Range Status   MRSA, PCR NEGATIVE NEGATIVE Final   Staphylococcus aureus POSITIVE (A) NEGATIVE Final    Comment: (NOTE) The Xpert SA Assay (FDA approved for NASAL specimens in patients 21 years of age and older), is one component of a comprehensive surveillance program. It is not intended to diagnose infection nor to guide or monitor treatment. Performed at [facility], [address], [place], [place] [address]   MRSA Next Gen by PCR, Nasal     Status: None   Collection Time: 03/25/23  6:40 AM   Specimen: Nasal Mucosa; Nasal Swab  Result Value Ref Range Status   MRSA by PCR Next Gen NOT DETECTED NOT DETECTED Final    Comment: (NOTE) The GeneXpert MRSA Assay (FDA approved for NASAL specimens only), is one component of a comprehensive MRSA colonization surveillance program. It is not intended to diagnose MRSA infection nor to guide or monitor treatment for MRSA infections. Test performance is not FDA approved in patients less than 49 years old. Performed at [facility], [address] [place], [place] [address]   Aerobic/Anaerobic Culture w Gram Stain (surgical/deep wound)     Status: None (Preliminary result)   Collection Time: 03/25/23 12:30 PM   Specimen: Soft Tissue, Other  Result Value Ref Range Status   Specimen Description TISSUE RIGHT TRANSMETATARSAL  Final   Special Requests A  Final   Gram Stain NO WBC SEEN RARE GRAM POSITIVE COCCI IN PAIRS   Final   Culture   Final    NO GROWTH < 24 HOURS Performed at [facility], [address] [place], [place] [address]    Report Status PENDING  Incomplete    Acey Lav, MD [facility] for Infectious Disease [facility] [phone] pager  03/26/2023, 4:04 PM

## 2023-03-26 NOTE — Progress Notes (Signed)
PT Cancellation Note  Patient Details Name: Taylor Hughes MRN: [id number] DOB: [birth year]   Cancelled Treatment:    Reason Eval/Treat Not Completed: Other (comment)  Ortho Tech notified me that [employer] did not have anything with a longer ankle strap. They are going to reach out to their supervisor (an OT) and see if they have any solution. OT had pt stand with rt foot on shoe but not fastened. Pt was unable to advance LLE despite use of walker. Pt told OT that he was done for the day and would not be getting up again. As of now, do not have appropriate shoe.    Jerolyn Center, PT Acute Rehabilitation Services  Office [phone]   Zena Amos 03/26/2023, 3:54 PM

## 2023-03-26 NOTE — Plan of Care (Signed)

## 2023-03-26 NOTE — Progress Notes (Signed)
PT Cancellation Note  Patient Details Name: Bayley Beamesderfer MRN: [id number] DOB: 1971/05/15   Cancelled Treatment:    Reason Eval/Treat Not Completed: Other (comment)  Post-op shoe does not fit. Called ortho tech and they will come by and lengthen strap or replace shoe.    Jerolyn Center, PT Acute Rehabilitation Services  Office [phone]   Zena Amos 03/26/2023, 10:26 AM

## 2023-03-26 NOTE — Progress Notes (Signed)
Patient ID: Taylor Hughes, male   DOB: 1971/01/31, 52 y.o.   MRN: [id number] Patient is postoperative day 1 transmetatarsal amputation.  Patient had abscess and necrotic tissue that extended into the midfoot.  This was grossly debrided.  Wound was irrigated with Vashe and vancomycin powder and Kerecis were applied.  There is 50 cc in the wound VAC canister.  Cultures are showing gram-positive cocci.  Anticipate discharge with the Praveena plus portable wound VAC pump.  Ideally nonweightbearing but patient may be weightbearing as tolerated for safe ambulation.  Plan for discharge on oral antibiotics for about 3 weeks pending culture sensitivities.

## 2023-03-26 NOTE — Progress Notes (Signed)
Pharmacy Antibiotic Note  Taylor Hughes is a 52 y.o. male admitted on 03/22/2023 with R-foot osteo.  Pharmacy has been consulted for Daptomycin + Rocephin + Flagyl dosing.  Noted patient underwent transmetatarsal amputation 8/14 however abscess and necrotic tissue noted to extend into the midfoot. ID has been consulted for recommendations. Intra-op culture data showing GPC in pairs on gram stain, culture still pending.   Plan: - Start Daptomycin 750 mg IV (8 mg/kg AdjBW) every 24 hours - CK check with AM labs - Continue Rocephin and Flagyl per MD - Will continue to follow renal function, culture results, LOT, and antibiotic de-escalation plans   Height: 5\' 11"  (180.3 cm) Weight: 124.7 kg (275 lb) IBW/kg (Calculated) : 75.3  Temp (24hrs), Avg:98.8 F (37.1 C), Min:98.5 F (36.9 C), Max:99.1 F (37.3 C)  Recent Labs  Lab 03/22/23 2023 03/22/23 2151 03/23/23 0005 03/23/23 0227 03/24/23 0316 03/25/23 0109 03/26/23 0326  WBC 17.9*  --  19.8* 19.1* 17.1* 15.5* 13.2*  CREATININE 1.52*  --   --  1.70* 1.41* 1.27* 1.46*  LATICACIDVEN  --  1.3 1.3 1.2  --   --   --     Estimated Creatinine Clearance: 80.5 mL/min (A) (by C-G formula based on SCr of 1.46 mg/dL (H)).    No Known Allergies  Antimicrobials this admission: Vanc 8/11 >> 8/15 Dapto 8/15 >> Cefepime 8/11 >> 8/15 Flagyl 8/12 >> Rocephin 8/15 >>  Dose adjustments this admission: N/a  Microbiology results: 8/11 BCx >> ngx4d 8/12 COVID >> neg 8/14 MRSA PCR > neg 8/14 R-TMA tissue cx >> GPC in pairs >> cx ng<24h  Thank you for allowing pharmacy to be a part of this patient's care.  Georgina Pillion, PharmD, BCPS, BCIDP Infectious Diseases Clinical Pharmacist 03/26/2023 1:36 PM   **Pharmacist phone directory can now be found on [email or web address] ([email or web address]).  Listed under [facility].

## 2023-03-27 DIAGNOSIS — I480 Paroxysmal atrial fibrillation: Secondary | ICD-10-CM | POA: Diagnosis not present

## 2023-03-27 DIAGNOSIS — L089 Local infection of the skin and subcutaneous tissue, unspecified: Secondary | ICD-10-CM | POA: Diagnosis not present

## 2023-03-27 DIAGNOSIS — M86271 Subacute osteomyelitis, right ankle and foot: Secondary | ICD-10-CM | POA: Diagnosis not present

## 2023-03-27 DIAGNOSIS — E11628 Type 2 diabetes mellitus with other skin complications: Secondary | ICD-10-CM | POA: Diagnosis not present

## 2023-03-27 DIAGNOSIS — Z89431 Acquired absence of right foot: Secondary | ICD-10-CM | POA: Diagnosis not present

## 2023-03-27 DIAGNOSIS — E1169 Type 2 diabetes mellitus with other specified complication: Secondary | ICD-10-CM | POA: Diagnosis not present

## 2023-03-27 LAB — CBC WITH DIFFERENTIAL/PLATELET
Abs Immature Granulocytes: 0.08 10*3/uL — ABNORMAL HIGH (ref 0.00–0.07)
Basophils Absolute: 0 10*3/uL (ref 0.0–0.1)
Basophils Relative: 0 %
Eosinophils Absolute: 0.3 10*3/uL (ref 0.0–0.5)
Eosinophils Relative: 4 %
HCT: 23.3 % — ABNORMAL LOW (ref 39.0–52.0)
Hemoglobin: 7.5 g/dL — ABNORMAL LOW (ref 13.0–17.0)
Immature Granulocytes: 1 %
Lymphocytes Relative: 13 %
Lymphs Abs: 1.2 10*3/uL (ref 0.7–4.0)
MCH: 27.9 pg (ref 26.0–34.0)
MCHC: 32.2 g/dL (ref 30.0–36.0)
MCV: 86.6 fL (ref 80.0–100.0)
Monocytes Absolute: 0.8 10*3/uL (ref 0.1–1.0)
Monocytes Relative: 9 %
Neutro Abs: 6.7 10*3/uL (ref 1.7–7.7)
Neutrophils Relative %: 73 %
Platelets: 470 10*3/uL — ABNORMAL HIGH (ref 150–400)
RBC: 2.69 MIL/uL — ABNORMAL LOW (ref 4.22–5.81)
RDW: 15.2 % (ref 11.5–15.5)
WBC: 9.1 10*3/uL (ref 4.0–10.5)
nRBC: 0 % (ref 0.0–0.2)

## 2023-03-27 LAB — BASIC METABOLIC PANEL
Anion gap: 9 (ref 5–15)
BUN: 20 mg/dL (ref 6–20)
CO2: 23 mmol/L (ref 22–32)
Calcium: 8.1 mg/dL — ABNORMAL LOW (ref 8.9–10.3)
Chloride: 103 mmol/L (ref 98–111)
Creatinine, Ser: 1.35 mg/dL — ABNORMAL HIGH (ref 0.61–1.24)
GFR, Estimated: >60 mL/min (ref >60)
Glucose, Bld: 275 mg/dL — ABNORMAL HIGH (ref 70–99)
Potassium: 3.8 mmol/L (ref 3.5–5.1)
Sodium: 135 mmol/L (ref 135–145)

## 2023-03-27 LAB — CK: Total CK: 95 U/L (ref 49–397)

## 2023-03-27 LAB — GLUCOSE, CAPILLARY
Glucose-Capillary: 267 mg/dL — ABNORMAL HIGH (ref 70–99)
Glucose-Capillary: 236 mg/dL — ABNORMAL HIGH (ref 70–99)
Glucose-Capillary: 202 mg/dL — ABNORMAL HIGH (ref 70–99)
Glucose-Capillary: 235 mg/dL — ABNORMAL HIGH (ref 70–99)

## 2023-03-27 LAB — BRAIN NATRIURETIC PEPTIDE: B Natriuretic Peptide: 320.4 pg/mL — ABNORMAL HIGH (ref 0.0–100.0)

## 2023-03-27 LAB — MAGNESIUM: Magnesium: 2.1 mg/dL (ref 1.7–2.4)

## 2023-03-27 LAB — PROCALCITONIN: Procalcitonin: 0.36 ng/mL

## 2023-03-27 LAB — C-REACTIVE PROTEIN: CRP: 19.2 mg/dL — ABNORMAL HIGH (ref <1.0)

## 2023-03-27 MED ORDER — INSULIN GLARGINE-YFGN 100 UNIT/ML ~~LOC~~ SOLN
20.0000 [IU] | Freq: Two times a day (BID) | SUBCUTANEOUS | Status: DC
  Administered 2023-03-27 (×2): 20 [IU] via SUBCUTANEOUS
  Filled 2023-03-27 (×3): qty 0.2

## 2023-03-27 MED ORDER — INSULIN ASPART 100 UNIT/ML IJ SOLN
0.0000 [IU] | Freq: Three times a day (TID) | INTRAMUSCULAR | Status: DC
  Administered 2023-03-27 (×2): 5 [IU] via SUBCUTANEOUS
  Administered 2023-03-28: 2 [IU] via SUBCUTANEOUS
  Administered 2023-03-28: 3 [IU] via SUBCUTANEOUS
  Administered 2023-03-29: 2 [IU] via SUBCUTANEOUS
  Administered 2023-03-29 – 2023-03-30 (×2): 3 [IU] via SUBCUTANEOUS
  Administered 2023-03-30 – 2023-04-01 (×4): 2 [IU] via SUBCUTANEOUS
  Administered 2023-04-01 – 2023-04-04 (×3): 3 [IU] via SUBCUTANEOUS
  Administered 2023-04-04 – 2023-04-05 (×3): 2 [IU] via SUBCUTANEOUS
  Administered 2023-04-06: 3 [IU] via SUBCUTANEOUS

## 2023-03-27 MED ORDER — METOPROLOL TARTRATE 25 MG PO TABS
25.0000 mg | ORAL_TABLET | Freq: Two times a day (BID) | ORAL | Status: DC
  Administered 2023-03-27 – 2023-04-07 (×22): 25 mg via ORAL
  Filled 2023-03-27 (×22): qty 1

## 2023-03-27 MED ORDER — INSULIN ASPART 100 UNIT/ML IJ SOLN
0.0000 [IU] | Freq: Every day | INTRAMUSCULAR | Status: DC
  Administered 2023-03-27: 2 [IU] via SUBCUTANEOUS

## 2023-03-27 MED ORDER — DOCUSATE SODIUM 100 MG PO CAPS
200.0000 mg | ORAL_CAPSULE | Freq: Two times a day (BID) | ORAL | Status: DC
  Filled 2023-03-27 (×13): qty 2

## 2023-03-27 MED ORDER — BISACODYL 5 MG PO TBEC: 10.0000 mg | DELAYED_RELEASE_TABLET | Freq: Every day | ORAL | Status: DC | PRN

## 2023-03-27 MED ORDER — MIDODRINE HCL 5 MG PO TABS
5.0000 mg | ORAL_TABLET | Freq: Two times a day (BID) | ORAL | Status: DC
  Administered 2023-03-27 – 2023-04-01 (×8): 5 mg via ORAL
  Filled 2023-03-27 (×9): qty 1

## 2023-03-27 MED ORDER — FOLIC ACID 1 MG PO TABS
1.0000 mg | ORAL_TABLET | Freq: Every day | ORAL | Status: DC
  Administered 2023-03-27 – 2023-04-07 (×12): 1 mg via ORAL
  Filled 2023-03-27 (×12): qty 1

## 2023-03-27 MED ORDER — FERROUS SULFATE 325 (65 FE) MG PO TABS
325.0000 mg | ORAL_TABLET | Freq: Two times a day (BID) | ORAL | Status: DC
  Administered 2023-03-27 – 2023-04-07 (×23): 325 mg via ORAL
  Filled 2023-03-27 (×22): qty 1

## 2023-03-27 NOTE — Progress Notes (Signed)
Subjective: No new complaints   Antibiotics:  Anti-infectives (From admission, onward)    Start     Dose/Rate Route Frequency Ordered Stop   03/26/23 1800  DAPTOmycin (CUBICIN) 750 mg in sodium chloride 0.9 % IVPB        8 mg/kg  95.1 kg (Adjusted) 130 mL/hr over 30 Minutes Intravenous Daily 03/26/23 1328     03/26/23 1330  cefTRIAXone (ROCEPHIN) 2 g in sodium chloride 0.9 % 100 mL IVPB        2 g 200 mL/hr over 30 Minutes Intravenous Daily 03/26/23 1317     03/25/23 1242  vancomycin (VANCOCIN) powder  Status:  Discontinued          As needed 03/25/23 1242 03/25/23 1258   03/25/23 0800  ceFAZolin (ANCEF) IVPB 3g/100 mL premix        3 g 200 mL/hr over 30 Minutes Intravenous On call to O.R. 03/25/23 0145 03/25/23 1224   03/23/23 1000  vancomycin (VANCOREADY) IVPB 1250 mg/250 mL  Status:  Discontinued        1,250 mg 166.7 mL/hr over 90 Minutes Intravenous Every 12 hours 03/22/23 2308 03/26/23 1317   03/23/23 1000  metroNIDAZOLE (FLAGYL) tablet 500 mg        500 mg Oral Every 12 hours 03/23/23 0906     03/23/23 0600  ceFEPIme (MAXIPIME) 2 g in sodium chloride 0.9 % 100 mL IVPB  Status:  Discontinued        2 g 200 mL/hr over 30 Minutes Intravenous Every 8 hours 03/22/23 2308 03/26/23 1317   03/22/23 2200  vancomycin (VANCOREADY) IVPB 1750 mg/350 mL        1,750 mg 175 mL/hr over 120 Minutes Intravenous  Once 03/22/23 2146 03/23/23 0203   03/22/23 2200  ceFEPIme (MAXIPIME) 2 g in sodium chloride 0.9 % 100 mL IVPB        2 g 200 mL/hr over 30 Minutes Intravenous  Once 03/22/23 2146 03/22/23 2234       Medications: Scheduled Meds:  vitamin C  1,000 mg Oral Daily   aspirin EC  81 mg Oral Daily   atorvastatin  20 mg Oral Daily   buPROPion  150 mg Oral Daily   docusate sodium  200 mg Oral BID   ferrous sulfate  325 mg Oral BID WC   folic acid  1 mg Oral Daily   gabapentin  300 mg Oral BID   heparin injection (subcutaneous)  5,000 Units Subcutaneous Q8H   insulin  aspart  0-15 Units Subcutaneous TID WC   insulin aspart  0-5 Units Subcutaneous QHS   insulin glargine-yfgn  20 Units Subcutaneous BID   metoCLOPramide (REGLAN) injection  10 mg Intravenous Q6H   metoprolol tartrate  25 mg Oral BID   metroNIDAZOLE  500 mg Oral Q12H   midodrine  5 mg Oral BID WC   mupirocin ointment  1 Application Nasal BID   nutrition supplement (JUVEN)  1 packet Oral BID BM   pantoprazole  40 mg Oral Daily   zinc sulfate  220 mg Oral Daily   Continuous Infusions:  cefTRIAXone (ROCEPHIN)  IV Stopped (03/27/23 0920)   DAPTOmycin (CUBICIN) 750 mg in sodium chloride 0.9 % IVPB Stopped (03/27/23 1348)   magnesium sulfate bolus IVPB     PRN Meds:.acetaminophen **OR** acetaminophen, acetaminophen, alum & mag hydroxide-simeth, bisacodyl, guaiFENesin-dextromethorphan, hydrALAZINE, labetalol, magnesium citrate, magnesium sulfate bolus IVPB, metoprolol tartrate, metoprolol tartrate, morphine injection, ondansetron, oxyCODONE, phenol,  polyethylene glycol, potassium chloride, senna-docusate, simethicone    Objective: Weight change:   Intake/Output Summary (Last 24 hours) at 03/27/2023 1704 Last data filed at 03/27/2023 1512 Gross per 24 hour  Intake 770 ml  Output 650 ml  Net 120 ml   Blood pressure 133/64, pulse 79, temperature 97.9 F (36.6 C), temperature source Oral, resp. rate 19, height 5\' 11"  (1.803 m), weight 124.7 kg, SpO2 95%. Temp:  [97.9 F (36.6 C)-98.3 F (36.8 C)] 97.9 F (36.6 C) (08/16 0815) Pulse Rate:  [75-79] 79 (08/16 0815) Resp:  [14-20] 19 (08/16 1300) BP: (102-133)/(64-76) 133/64 (08/16 1234) SpO2:  [93 %-96 %] 95 % (08/16 0411)  Physical Exam: Physical Exam Constitutional:      Appearance: He is well-developed.  HENT:     Head: Normocephalic and atraumatic.  Eyes:     Conjunctiva/sclera: Conjunctivae normal.  Cardiovascular:     Rate and Rhythm: Normal rate and regular rhythm.  Pulmonary:     Effort: Pulmonary effort is normal. No  respiratory distress.     Breath sounds: No wheezing.  Abdominal:     General: There is no distension.     Palpations: Abdomen is soft.  Musculoskeletal:     Cervical back: Normal range of motion and neck supple.  Skin:    General: Skin is warm and dry.     Findings: No erythema or rash.  Neurological:     General: No focal deficit present.     Mental Status: He is alert and oriented to person, place, and time.  Psychiatric:        Mood and Affect: Mood normal.        Behavior: Behavior normal.        Thought Content: Thought content normal.        Judgment: Judgment normal.      CBC:    BMET Recent Labs    03/26/23 0326 03/27/23 0658  NA 136 135  K 3.9 3.8  CL 103 103  CO2 22 23  GLUCOSE 210* 275*  BUN 22* 20  CREATININE 1.46* 1.35*  CALCIUM 8.1* 8.1*     Liver Panel  No results for input(s): "PROT", "ALBUMIN", "AST", "ALT", "ALKPHOS", "BILITOT", "BILIDIR", "IBILI" in the last 72 hours.     Sedimentation Rate No results for input(s): "ESRSEDRATE" in the last 72 hours. C-Reactive Protein Recent Labs    03/26/23 0326 03/27/23 0658  CRP 25.5* 19.2*    Micro Results: Recent Results (from the past 720 hour(s))  Blood culture (routine x 2)     Status: None   Collection Time: 03/22/23  8:23 PM   Specimen: BLOOD RIGHT HAND  Result Value Ref Range Status   Specimen Description BLOOD RIGHT HAND  Final   Special Requests   Final    BOTTLES DRAWN AEROBIC AND ANAEROBIC Blood Culture adequate volume   Culture   Final    NO GROWTH 5 DAYS Performed at [facility], [address], [place], [place] [address]    Report Status 03/27/2023 FINAL  Final  Blood culture (routine x 2)     Status: None   Collection Time: 03/22/23  9:09 PM   Specimen: BLOOD  Result Value Ref Range Status   Specimen Description BLOOD SITE NOT SPECIFIED  Final   Special Requests   Final    BOTTLES DRAWN AEROBIC ONLY Blood Culture adequate volume   Culture   Final    NO GROWTH  5 DAYS Performed at [facility]  [facility], [address] [place], [place] [address]    Report Status 03/27/2023 FINAL  Final  SARS Coronavirus 2 by RT PCR (hospital order, performed in [facility] hospital lab) *cepheid single result test* Anterior Nasal Swab     Status: None   Collection Time: 03/23/23  5:20 PM   Specimen: Anterior Nasal Swab  Result Value Ref Range Status   SARS Coronavirus 2 by RT PCR NEGATIVE NEGATIVE Final    Comment: Performed at [facility], [address] [place], [place] [address]  Surgical PCR screen     Status: Abnormal   Collection Time: 03/24/23  5:30 PM   Specimen: Nasal Mucosa; Nasal Swab  Result Value Ref Range Status   MRSA, PCR NEGATIVE NEGATIVE Final   Staphylococcus aureus POSITIVE (A) NEGATIVE Final    Comment: (NOTE) The Xpert SA Assay (FDA approved for NASAL specimens in patients 73 years of age and older), is one component of a comprehensive surveillance program. It is not intended to diagnose infection nor to guide or monitor treatment. Performed at [facility], [address] [place], [place] [address]   MRSA Next Gen by PCR, Nasal     Status: None   Collection Time: 03/25/23  6:40 AM   Specimen: Nasal Mucosa; Nasal Swab  Result Value Ref Range Status   MRSA by PCR Next Gen NOT DETECTED NOT DETECTED Final    Comment: (NOTE) The GeneXpert MRSA Assay (FDA approved for NASAL specimens only), is one component of a comprehensive MRSA colonization surveillance program. It is not intended to diagnose MRSA infection nor to guide or monitor treatment for MRSA infections. Test performance is not FDA approved in patients less than 57 years old. Performed at [facility], [address], [place], [place] [address]   Aerobic/Anaerobic Culture w Gram Stain (surgical/deep wound)     Status: None (Preliminary result)   Collection Time: 03/25/23 12:30 PM   Specimen: Soft Tissue, Other  Result Value Ref Range Status   Specimen  Description TISSUE RIGHT TRANSMETATARSAL  Final   Special Requests A  Final   Gram Stain NO WBC SEEN RARE GRAM POSITIVE COCCI IN PAIRS   Final   Culture   Final    HOLDING FOR POSSIBLE ANAEROBE Performed at [facility], [address] [place], [place] [address]    Report Status PENDING  Incomplete    Studies/Results: No results found.    Assessment/Plan:  INTERVAL HISTORY: cultures still incubating   Principal Problem:   Diabetic foot infection (HCC) Active Problems:   Essential hypertension   Type 2 diabetes mellitus with other specified complication (HCC)   PAF (paroxysmal atrial fibrillation) (HCC)   Osteomyelitis (HCC)   HLD (hyperlipidemia)   Gastroparesis   Severe protein-calorie malnutrition (HCC)   Lymphedema   Abscess of right foot   Sinus tachycardia   Right ventricular enlargement    Taylor Hughes is a 52 y.o. 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  Operative cultures are still incubating with micro lab holding for an anaerobe specifically.  Patient remains on ceftriaxone daptomycin and metronidazole.  He is telling me that his insurance will not cover home IV antibiotics.  He is working with case management to determine what types of antibiotics would  be covered and wear.  I have personally spent 50 minutes involved in face-to-face and non-face-to-face activities for this patient on the day of the visit. Professional time spent includes the following activities: Preparing to see the patient (review of tests), Obtaining and/or reviewing separately obtained history (admission/discharge record), Performing a medically appropriate examination and/or evaluation , Ordering medications/tests/procedures,  referring and communicating with other health care professionals, Documenting clinical information in the EMR, Independently interpreting results (not separately reported), Communicating results to the patient/family/caregiver, Counseling and educating the patient/family/caregiver and Care coordination (not separately reported).   Dr Renold Don will culture data over the weekend it is available for questions.  New ID team will be her Monday.    LOS: 4 days   Acey Lav 03/27/2023, 5:04 PM

## 2023-03-27 NOTE — Progress Notes (Signed)
Inpatient Rehab Admissions Coordinator:   OT recommendations for SNF.  Will sign off for CIR at this time.   Estill Dooms, PT, DPT Admissions Coordinator [phone] 03/27/23  3:36 PM

## 2023-03-27 NOTE — Inpatient Diabetes Management (Signed)
Inpatient Diabetes Program Recommendations  AACE/ADA: New Consensus Statement on Inpatient Glycemic Control (2015)  Target Ranges:  Prepandial:   less than 140 mg/dL      Peak postprandial:   less than 180 mg/dL (1-2 hours)      Critically ill patients:  140 - 180 mg/dL   Lab Results  Component Value Date   GLUCAP 267 (H) 03/27/2023   HGBA1C 9.8 (H) 03/23/2023    Review of Glycemic Control  Latest Reference Range & Units 03/26/23 12:08 03/26/23 16:41 03/26/23 21:23 03/27/23 08:15  Glucose-Capillary 70 - 99 mg/dL 782 (H) 956 (H) 213 (H) 267 (H)  (H): Data is abnormally high  Diabetes history: DM2 Outpatient Diabetes medications: Basaglar 30 units every day, Humalog 0-12 units TID, Ozempic 1 mg weekly (not taking) Current orders for Inpatient glycemic control: Semglee 20 units every day, Novolog 0-6 units TID  Inpatient Diabetes Program Recommendations:    Might consider increasing basal and maybe add some meal coverage TID with meals if he consumes at least 50%.   Will speak with him today regarding DM management.  Ordered the LWWD booklet.  Will continue to follow while inpatient.  Thank you, Dulce Sellar, MSN, CDCES Diabetes Coordinator Inpatient Diabetes Program [phone] (team pager from 8a-5p)

## 2023-03-27 NOTE — Progress Notes (Signed)
PT Cancellation Note  Patient Details Name: Taylor Hughes MRN: [id number] DOB: 28-Oct-1970   Cancelled Treatment:    Reason Eval/Treat Not Completed: Patient declined, no reason specified.  Pt just said, No we'll do it tomorrow. 03/27/2023  Jacinto Halim., PT Acute Rehabilitation Services [phone]  (office)   Eliseo Gum Beverley Sherrard 03/27/2023, 4:19 PM

## 2023-03-27 NOTE — Plan of Care (Signed)

## 2023-03-27 NOTE — Inpatient Diabetes Management (Signed)
Inpatient Diabetes Program Recommendations  AACE/ADA: New Consensus Statement on Inpatient Glycemic Control (2015)  Target Ranges:  Prepandial:   less than 140 mg/dL      Peak postprandial:   less than 180 mg/dL (1-2 hours)      Critically ill patients:  140 - 180 mg/dL   Lab Results  Component Value Date   GLUCAP 267 (H) 03/27/2023   HGBA1C 9.8 (H) 03/23/2023    Review of Glycemic Control  Latest Reference Range & Units 03/26/23 08:34 03/26/23 12:08 03/26/23 16:41 03/26/23 21:23 03/27/23 08:15  Glucose-Capillary 70 - 99 mg/dL 295 (H) 188 (H) 416 (H) 260 (H) 267 (H)  (H): Data is abnormally high  Diabetes history: DM2 Outpatient Diabetes medications: Basaglar 30 units QAM, Humalog 0-12 units TID and Ozempic (stopped a couple of months ago) Current orders for Inpatient glycemic control: Semglee 20 units BID, Novolog 0-15 units TID and 0-5 units QHS  Spoke with patient at bedside.  Reviewed patient's current A1c of 9.8% (average BG of 235 mg/dL). Explained what a A1c is and what it measures. Also reviewed goal A1c with patient, importance of good glucose control @ home, and blood sugar goals.  He stopped taking his Ozempic 2 months ago because he felt he was losing too much weight.  He avoids caloric beverages.  H is current with the [facility] for PCP and medications.  Medications are $0 .  Reviewed signs and symptoms of both hyperglycemia and hypoglycemia along with treatments.  He is asking for a case manager because he will not be able to go back to work.  Will reach out to [facility].    Will continue to follow while inpatient.  Thank you, Dulce Sellar, MSN, CDCES Diabetes Coordinator Inpatient Diabetes Program [phone] (team pager from 8a-5p)

## 2023-03-27 NOTE — TOC Initial Note (Signed)
Transition of Care [facility]) - Initial/Assessment Note    Patient Details  Name: Taylor Hughes MRN: [id number] Date of Birth: 1970-12-11  Transition of Care [facility]) CM/SW Contact:    Mearl Latin, LCSW Phone Number: 03/27/2023, 4:21 PM  Clinical Narrative:                 CSW met with patient regarding request for financial assistance and disability. He placed his fiance on speakerphone. He stated he is going to have to quit his job and wants help with Disability as he said he cannot stand on his legs for periods at a time. CSW explained that per the financial counseling team he does not meet criteria for disability. CSW requested team also screen for Medicaid so CSW awaiting response. CSW advised patient to contact his job to inquire about short term disability. Patient stated he is not able to afford therapy and is not comfortable leaving the hospital until a plan is in place. CSW explained that we will check his insurance benefits to see if it will cover SNF. CSW provided community resources packet and fiance stated she would contact DSS on Monday to see what is available.     Barriers to Discharge: Inadequate or no insurance, English as a second language teacher, Continued Medical Work up, SNF Pending bed offer   Patient Goals and CMS Choice Patient states their goals for this hospitalization and ongoing recovery are:: Rehab CMS Medicare.gov Compare Post Acute Care list provided to:: Patient Choice offered to / list presented to : Patient (Fiance)      Expected Discharge Plan and Services In-house Referral: Clinical Social Work, Artist     Living arrangements for the past 2 months: Single Family Home                                      Prior Living Arrangements/Services Living arrangements for the past 2 months: Single Family Home   Patient language and need for interpreter reviewed:: Yes Do you feel safe going back to the place where you live?: Yes      Need for Family  Participation in Patient Care: Yes (Comment) Care giver support system in place?: Yes (comment)   Criminal Activity/Legal Involvement Pertinent to Current Situation/Hospitalization: No - Comment as needed  Activities of Daily Living Home Assistive Devices/Equipment: None ADL Screening (condition at time of admission) Patient's cognitive ability adequate to safely complete daily activities?: No Is the patient deaf or have difficulty hearing?: No Does the patient have difficulty seeing, even when wearing glasses/contacts?: No Does the patient have difficulty concentrating, remembering, or making decisions?: No Patient able to express need for assistance with ADLs?: Yes Does the patient have difficulty dressing or bathing?: No Independently performs ADLs?: Yes (appropriate for developmental age) Does the patient have difficulty walking or climbing stairs?: Yes Weakness of Legs: Right (wound on the right foot) Weakness of Arms/Hands: None  Permission Sought/Granted Permission sought to share information with : Facility Medical sales representative, Family Supports Permission granted to share information with : Yes, Verbal Permission Granted        Permission granted to share info w Relationship: Fiance     Emotional Assessment Appearance:: Appears stated age Attitude/Demeanor/Rapport: Engaged Affect (typically observed): Appropriate, Frustrated Orientation: : Oriented to Self, Oriented to Place, Oriented to  Time, Oriented to Situation Alcohol / Substance Use: Not Applicable Psych Involvement: No (comment)  Admission diagnosis:  Diabetic foot  infection (HCC) [W09.811, L08.9] Osteomyelitis of right foot, unspecified type [facility]) [M86.9] Patient Active Problem List   Diagnosis Date Noted   Severe protein-calorie malnutrition (HCC) 03/24/2023   Lymphedema 03/24/2023   Abscess of right foot 03/24/2023   Sinus tachycardia 03/24/2023   Right ventricular enlargement 03/24/2023   Diabetic  foot infection (HCC) 03/23/2023   Osteomyelitis (HCC) 03/22/2023   Atrial fibrillation, chronic (HCC) 03/22/2023   HLD (hyperlipidemia) 03/22/2023   Gastroparesis 03/22/2023   Depression 09/08/2022   DKA (diabetic ketoacidosis) (HCC) 08/13/2022   Influenza A 08/13/2022   GERD (gastroesophageal reflux disease) 03/03/2022   Diabetes mellitus (HCC) 09/19/2021   Abscess of neck 09/19/2021   URI (upper respiratory infection) 06/07/2021   PAF (paroxysmal atrial fibrillation) (HCC)    Hypokalemia    Normocytic anemia    Sepsis (HCC) 05/24/2019   Cellulitis of chest wall    Venous insufficiency (chronic) (peripheral) 03/15/2019   Edema of both lower extremities due to peripheral venous insufficiency 03/15/2019   Hypoglycemia due to insulin 12/18/2018   Neck abscess 11/23/2018   Essential hypertension 11/23/2018   AKI (acute kidney injury) (HCC) 11/23/2018   Obesity (BMI 30.0-34.9) 11/23/2018   Type 2 diabetes mellitus with other specified complication (HCC) 11/23/2018   Sepsis due to group B Streptococcus (HCC)    Diabetic infection of left foot (HCC)    Left foot infection    DMII (diabetes mellitus, type 2) (HCC) 07/28/2018   Leukocytosis 07/28/2018   Hyponatremia 07/28/2018   PCP:  Hoy Register, MD Pharmacy:   [facility] [address] [facility] [address] - [place], [place] - [address] [place] [address] Phone: [phone] Fax: [phone]  [facility] [address] [place] [address] Phone: [phone] Fax: [phone]  [id number] [place], [place] - [address] AT [facility] [address] [facility] [address] [place] [address] Phone: [phone] Fax: [phone]   - [facility] [address] [place] [address] Phone: [phone] Fax: [phone]  [facility] [address] [place]), [place] - [address] [place] [address] Phone: [phone] Fax: [phone]     Social Determinants of Health (SDOH) Social History: SDOH Screenings   Food Insecurity: No Food Insecurity (03/23/2023)  Housing: Low Risk  (03/23/2023)  Transportation Needs: No Transportation Needs (03/23/2023)  Utilities: Not At Risk (03/23/2023)  Depression (PHQ2-9): Low Risk  (01/19/2023)  Tobacco Use: Low Risk  (03/25/2023)   SDOH Interventions: Transportation Interventions: Intervention Not Indicated, Inpatient TOC, Patient Resources (Friends/Family)   Readmission Risk Interventions    03/24/2023    4:56 PM 08/15/2022    1:23 PM  Readmission Risk Prevention Plan  Transportation Screening Complete Complete  PCP or Specialist Appt within 5-7 Days  Complete  PCP or Specialist Appt within 3-5 Days Complete   Home Care Screening  Complete  Medication Review (RN CM)  Complete  HRI or Home Care Consult Complete   Social Work Consult for Recovery Care Planning/Counseling Complete   Palliative Care Screening Not Applicable   Medication Review Oceanographer) Referral to Pharmacy

## 2023-03-27 NOTE — Progress Notes (Signed)
Patient ID: Taylor Hughes, male   DOB: [birth year], 52 y.o.   MRN: [id number] Patient is a 52 year old gentleman status post transmetatarsal amputation on the right.  Patient does have decreased swelling but still has significant lymphedema on the right.  Wound VAC drainage at 150 cc.  Wound cultures show gram-positive cocci cultures are pending.  Patient was able to stand briefly on 1 foot.  Anticipate patient will need discharge to skilled nursing.

## 2023-03-27 NOTE — Progress Notes (Signed)
PROGRESS NOTE                                                                                                                                                                                                             Patient Demographics:    Taylor Hughes, is a 52 y.o. male, DOB - September 21, 1970, WUJ:[id number]  Outpatient Primary MD for the patient is Hoy Register, MD    LOS - 4  Admit date - 03/22/2023    Chief Complaint  Patient presents with   Wound Infection   Hyperglycemia       Brief Narrative (HPI from H&P)   52 year old male with past medical history of T2DM, peripheral neuropathy, atrial fibrillation, HTN, and GERD. He presents with infection of his right lower extremity . Admitted with diabetic foot infection/osteo.  He was seen by orthopedics and cardiology, transferred to my service on 03/22/2023.   Subjective:   Patient in bed, appears comfortable, denies any headache, no fever, no chest pain or pressure, no shortness of breath , no abdominal pain. No focal weakness.   Assessment  & Plan :    Sepsis due to right diabetic foot ulcer present on admission.  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   Intermittent SVT and sinus tachycardia.  Due to #1 above.  Echocardiogram noted with preserved left sided function, stable TSH, CTA negative for PE, after amputation and supportive care for infection tachycardia much improved.  Right lower extremity edema.  Present for the last month secondary to the infection, lower  extremity venous duplex although limited but negative.  Essential hypertension.  Pressure on the softer side, improved after IV fluids and midodrine, now tolerating beta-blocker, start tapering off midodrine and monitor.  AKI.  Due to sepsis.  Hydrate and monitor hold nephrotoxins, stable renal ultrasound, improving, stop IV fluids and monitor.  Avoid nephrotoxins.  Anemia.  Combination of heme dilution, sepsis and iron deficiency on anemia panel, placed on oral iron and folic acid, type screen done, continue to monitor  Dyslipidemia.  On statin.  Vomiting due to gastroparesis.  Much improved with supportive care.  Morbid obesity.  BMI 41.  Follow-up with PCP for weight loss.  DM type II.  On Lantus and SSI.  Was adjusted further on 03/27/2023 for better control.  Getting diabetic and insulin education.  Lab Results  Component Value Date   HGBA1C 9.8 (H) 03/23/2023   CBG (last 3)  Recent Labs    03/26/23 1641 03/26/23 2123 03/27/23 0815  GLUCAP 264* 260* 267*         Condition - Fair  Family Communication  :  None  Code Status :  Full  Consults  :  Cards, Ortho, ID  PUD Prophylaxis : PPI   Procedures  :     Right foot transmetatarsal amputation by Dr. [name] on 03/25/2023.  Echocardiogram.  1. Left ventricular ejection fraction, by estimation, is 70 to 75%. The left ventricle has hyperdynamic function. The left ventricle has no regional wall motion abnormalities. Left ventricular diastolic parameters are indeterminate.  2. Right ventricular systolic function is hyperdynamic. The right ventricular size is severely enlarged.  3. The mitral valve was not well visualized. No evidence of mitral valve regurgitation. No evidence of mitral stenosis.  4. The aortic valve was not well visualized. Aortic valve regurgitation is not visualized.  5. The inferior vena cava is normal in size with greater than 50% respiratory variability, suggesting right atrial pressure of 3 mmHg.    CTA  chest. 1. No CT evidence of pulmonary embolism. 2. Small bilateral pleural effusions with minimal compressive atelectasis of the lung bases. Pneumonia is not excluded.  Renal ultrasound.  Nonacute.    Lower extremity venous duplex.  No DVT.    ABI - Right: Resting right ankle-brachial index indicates mild right lower extremity arterial disease. Left: Resting left ankle-brachial index is within normal range. *See table(s) above for measurements and observations.      Disposition Plan  :    Status is: Inpatient   DVT Prophylaxis  :    SCD's Start: 03/25/23 1501 heparin injection 5,000 Units Start: 03/23/23 1800    Lab Results  Component Value Date   PLT 470 (H) 03/27/2023    Diet :  Diet Order             Diet Carb Modified Fluid consistency: Thin; Room service appropriate? Yes  Diet effective now                    Inpatient Medications  Scheduled Meds:  vitamin C  1,000 mg Oral Daily   aspirin EC  81 mg Oral Daily   atorvastatin  20 mg Oral Daily   buPROPion  150 mg Oral Daily   docusate sodium  200 mg Oral BID   gabapentin  300 mg Oral BID   heparin injection (subcutaneous)  5,000 Units Subcutaneous Q8H   insulin aspart  0-15 Units Subcutaneous TID WC   insulin aspart  0-5 Units Subcutaneous QHS   insulin glargine-yfgn  20 Units Subcutaneous BID   metoCLOPramide (REGLAN) injection  10 mg Intravenous Q6H   metoprolol tartrate  25 mg Oral BID   metroNIDAZOLE  500 mg Oral Q12H   midodrine  5 mg Oral BID WC   mupirocin ointment  1 Application Nasal BID   nutrition supplement (JUVEN)  1 packet Oral BID BM   pantoprazole  40 mg Oral Daily   zinc sulfate  220 mg Oral Daily   Continuous Infusions:  cefTRIAXone (ROCEPHIN)  IV 2 g (03/27/23 0850)   DAPTOmycin (CUBICIN) 750 mg in sodium chloride 0.9 % IVPB 750 mg (03/26/23 1820)   magnesium sulfate bolus IVPB     potassium PHOSPHATE IVPB (in mmol)     PRN Meds:.acetaminophen **OR** acetaminophen,  acetaminophen, alum & mag hydroxide-simeth, bisacodyl, guaiFENesin-dextromethorphan, hydrALAZINE, labetalol, magnesium citrate, magnesium sulfate bolus IVPB, metoprolol tartrate, metoprolol tartrate, morphine injection, ondansetron, oxyCODONE, phenol, polyethylene glycol, potassium chloride, senna-docusate, simethicone    Objective:   Vitals:   03/27/23 0000 03/27/23 0400 03/27/23 0411 03/27/23 0815  BP: 102/72   127/76  Pulse: 75  79 79  Resp: 18 17 20 20   Temp: 98.3 F (36.8 C)   97.9 F (36.6 C)  TempSrc: Oral   Oral  SpO2: 96% 93% 95%   Weight:      Height:        Wt Readings from Last 3 Encounters:  03/25/23 124.7 kg  01/19/23 135.9 kg  09/08/22 133.1 kg     Intake/Output Summary (Last 24 hours) at 03/27/2023 0954 Last data filed at 03/27/2023 0852 Gross per 24 hour  Intake 605 ml  Output 650 ml  Net -45 ml     Physical Exam  Awake Alert, No new F.N deficits, Normal affect Green.AT,PERRAL Supple Neck, No JVD,   Symmetrical Chest wall movement, Good air movement bilaterally, CTAB RRR,No Gallops,Rubs or new Murmurs,  +ve B.Sounds, Abd Soft, No tenderness,   R TMA under bandage - W.Vac, improving right lower extremity edema and swelling    Data Review:    Recent Labs  Lab 03/22/23 2023 03/23/23 0005 03/23/23 0227 03/24/23 0316 03/25/23 0109 03/26/23 0326 03/27/23 0658  WBC 17.9*   < > 19.1* 17.1* 15.5* 13.2* 9.1  HGB 9.3*   < > 8.2* 8.5* 7.7* 7.8* 7.5*  HCT 29.9*   < > 25.8* 26.0* 23.6* 24.5* 23.3*  PLT 539*   < > 469* 465* 461* 470* 470*  MCV 87.9   < > 85.4 84.1 83.4 83.9 86.6  MCH 27.4   < > 27.2 27.5 27.2 26.7 27.9  MCHC 31.1   < > 31.8 32.7 32.6 31.8 32.2  RDW 15.1   < > 14.9 14.6 14.6 15.0 15.2  LYMPHSABS 1.0  --  0.9 1.4  --  1.7 1.2  MONOABS 0.9  --  1.0 1.3*  --  1.0 0.8  EOSABS 0.1  --  0.0 0.0  --  0.2 0.3  BASOSABS 0.0  --  0.0 0.0  --  0.0 0.0   < > = values in this interval not displayed.    Recent Labs  Lab 03/22/23 2023  03/22/23 2151 03/23/23 0005 03/23/23 0227 03/23/23 1859 03/24/23 0316 03/25/23 0109 03/25/23 0819 03/26/23 0326 03/27/23 0658  NA 130*  --   --  132*  --  137 134*  --  136 135  K 5.0  --   --  4.3  --  3.6 3.8  --  3.9 3.8  CL 99  --   --  102  --  106 102  --  103 103  CO2 11*  --   --  12*  --  22 24  --  22 23  ANIONGAP 20*  --   --  18*  --  9 8  --  11 9  GLUCOSE 405*  --   --  410*  --  126* 205*  --  210* 275*  BUN 26*  --   --  28*  --  21* 19  --  22* 20  CREATININE 1.52*  --   --  1.70*  --  1.41* 1.27*  --  1.46* 1.35*  AST 14*  --   --  11*  --  14*  --   --   --   --   ALT 15  --   --  13  --  14  --   --   --   --   ALKPHOS 64  --   --  56  --  50  --   --   --   --   BILITOT 1.2  --   --  1.1  --  0.4  --   --   --   --   ALBUMIN 2.1*  --   --  1.8*  --  1.6*  --   --   --   --   CRP  --  30.8*  --   --   --   --   --  27.2* 25.5* 19.2*  PROCALCITON  --   --   --   --   --   --   --  0.62 0.60 0.36  LATICACIDVEN  --  1.3 1.3 1.2  --   --   --   --   --   --   INR  --   --   --   --   --   --   --  1.2  --   --   TSH  --   --   --   --   --  1.058  --   --   --   --   HGBA1C  --   --  9.8*  --   --   --   --   --   --   --   BNP  --   --   --   --  462.8*  --   --  524.5* 218.5* 320.4*  MG  --   --   --  1.9  --  1.7  --   --  1.9 2.1  CALCIUM 8.7*  --   --  8.7*  --  8.2* 7.8*  --  8.1* 8.1*      Recent Labs  Lab 03/22/23 2151 03/23/23 0005 03/23/23 0227 03/23/23 1859 03/24/23 0316 03/25/23 0109 03/25/23 0819 03/26/23 0326 03/27/23 0658  CRP 30.8*  --   --   --   --   --  27.2* 25.5* 19.2*  PROCALCITON  --   --   --   --   --   --  0.62 0.60 0.36  LATICACIDVEN 1.3 1.3 1.2  --   --   --   --   --   --   INR  --   --   --   --   --   --  1.2  --   --   TSH  --   --   --   --  1.058  --   --   --   --   HGBA1C  --  9.8*  --   --   --   --   --   --   --   BNP  --   --   --  462.8*  --   --  524.5* 218.5* 320.4*  MG  --   --  1.9  --  1.7  --   --  1.9  2.1  CALCIUM  --   --  8.7*  --  8.2* 7.8*  --  8.1* 8.1*    Micro Results Recent Results (from the past 240 hour(s))  Blood culture (routine x 2)     Status: None   Collection Time: 03/22/23  8:23 PM   Specimen: BLOOD RIGHT HAND  Result Value Ref Range Status   Specimen Description BLOOD RIGHT HAND  Final   Special Requests   Final    BOTTLES DRAWN AEROBIC AND ANAEROBIC Blood Culture adequate volume   Culture   Final    NO GROWTH 5 DAYS Performed at [facility], [address], [place], [place] [address]    Report Status 03/27/2023 FINAL  Final  Blood culture (routine x 2)     Status: None   Collection Time: 03/22/23  9:09 PM   Specimen: BLOOD  Result Value Ref Range Status   Specimen Description BLOOD SITE NOT SPECIFIED  Final   Special Requests   Final    BOTTLES DRAWN AEROBIC ONLY Blood Culture adequate volume   Culture   Final    NO GROWTH 5 DAYS Performed at [facility], [address], [place], [place] [address]    Report Status 03/27/2023 FINAL  Final  SARS Coronavirus 2 by RT PCR (hospital order, performed in [facility] hospital lab) *cepheid single result test* Anterior Nasal Swab     Status: None   Collection Time: 03/23/23  5:20 PM   Specimen: Anterior Nasal Swab  Result Value Ref Range Status   SARS Coronavirus 2 by RT PCR NEGATIVE NEGATIVE Final    Comment: Performed at [facility], [address], [place], [place] [address]  Surgical PCR screen     Status: Abnormal   Collection Time: 03/24/23  5:30 PM   Specimen: Nasal Mucosa; Nasal Swab  Result Value Ref Range Status   MRSA, PCR NEGATIVE NEGATIVE Final   Staphylococcus aureus POSITIVE (A) NEGATIVE Final    Comment: (NOTE) The Xpert SA Assay (FDA approved for NASAL specimens in patients 39 years of age and older), is one component of a comprehensive surveillance program. It is not intended to diagnose infection nor to guide or monitor treatment. Performed at [facility],  [address] [place], [place] [address]   MRSA Next Gen by PCR, Nasal     Status: None   Collection Time: 03/25/23  6:40 AM   Specimen: Nasal Mucosa; Nasal Swab  Result Value Ref Range Status   MRSA by PCR Next Gen NOT DETECTED NOT DETECTED Final    Comment: (NOTE) The GeneXpert MRSA Assay (FDA approved for NASAL specimens only), is one component of a comprehensive MRSA colonization surveillance program. It is not intended to diagnose MRSA infection nor to guide or monitor treatment for MRSA infections. Test performance is not FDA approved in patients less than 66 years old. Performed at [facility], [address], [place], [place] [address]   Aerobic/Anaerobic Culture w Gram Stain (surgical/deep wound)     Status: None (Preliminary result)   Collection Time: 03/25/23 12:30 PM   Specimen: Soft Tissue, Other  Result Value Ref Range Status   Specimen Description TISSUE RIGHT TRANSMETATARSAL  Final   Special Requests A  Final   Gram Stain NO WBC SEEN RARE GRAM POSITIVE COCCI IN PAIRS   Final   Culture   Final  NO GROWTH 2 DAYS Performed at [facility], [address], [place], [place] [address]    Report Status PENDING  Incomplete    Radiology Reports DG Chest Port 1 View  Result Date: 03/25/2023 CLINICAL DATA:  Shortness of breath. EXAM: PORTABLE CHEST 1 VIEW COMPARISON:  03/23/2023 FINDINGS: Low volume film. Cardiopericardial silhouette is at upper limits of normal for size. There is pulmonary vascular congestion. Persistent diffuse interstitial prominence may be chronic but component of interstitial edema is not excluded. No dense focal airspace consolidation or evidence of pleural effusion. IMPRESSION: Low volume film with pulmonary vascular congestion and diffuse interstitial prominence. Interstitial edema not excluded. Electronically Signed   By: Kennith Center M.D.   On: 03/25/2023 06:14   CT Angio Chest Pulmonary Embolism (PE) W or WO Contrast  Result Date:  03/24/2023 CLINICAL DATA:  Concern for pulmonary embolism. EXAM: CT ANGIOGRAPHY CHEST WITH CONTRAST TECHNIQUE: Multidetector CT imaging of the chest was performed using the standard protocol during bolus administration of intravenous contrast. Multiplanar CT image reconstructions and MIPs were obtained to evaluate the vascular anatomy. RADIATION DOSE REDUCTION: This exam was performed according to the departmental dose-optimization program which includes automated exposure control, adjustment of the mA and/or kV according to patient size and/or use of iterative reconstruction technique. CONTRAST:  75mL OMNIPAQUE IOHEXOL 350 MG/ML SOLN COMPARISON:  Chest CT dated 05/23/2019. FINDINGS: Cardiovascular: There is no cardiomegaly or pericardial effusion. There is coronary vascular calcification. The thoracic aorta is unremarkable. Evaluation of the pulmonary arteries is limited due to respiratory motion. No pulmonary artery embolus identified. Mediastinum/Nodes: No hilar or mediastinal adenopathy. The esophagus is grossly unremarkable. No mediastinal fluid collection. Lungs/Pleura: Small bilateral pleural effusions. Minimal compressive atelectasis of the lung bases. Pneumonia is not excluded. There is no pneumothorax. The central airways are patent. Upper Abdomen: No acute abnormality. Musculoskeletal: Mild subcutaneous edema. No acute osseous pathology. Review of the MIP images confirms the above findings. IMPRESSION: 1. No CT evidence of pulmonary embolism. 2. Small bilateral pleural effusions with minimal compressive atelectasis of the lung bases. Pneumonia is not excluded. Electronically Signed   By: Elgie Collard M.D.   On: 03/24/2023 20:06   ECHOCARDIOGRAM COMPLETE  Result Date: 03/24/2023    ECHOCARDIOGRAM REPORT   Patient Name:   Taylor Hughes Date of Exam: 03/24/2023 Medical Rec #:  [id number]    Height:       71.0 in Accession #:    [id number]   Weight:       299.8 lb Date of Birth:  [birth year]     BSA:           2.504 m Patient Age:    52 years     BP:           114/53 mmHg Patient Gender: M            HR:           129 bpm. Exam Location:  Inpatient Procedure: 2D Echo, Cardiac Doppler, Color Doppler and Intracardiac            Opacification Agent Indications:    R00.8 Other abnormalities of heart beat  History:        Patient has prior history of Echocardiogram examinations, most                 recent 08/15/2022. Arrythmias:Atrial Fibrillation; Risk                 Factors:Diabetes, Dyslipidemia and Hypertension.  Sonographer:    Milbert Coulter Referring Phys: [phone] A CALDWELL POWELL JR  Sonographer Comments: Patient is obese. Image acquisition challenging due to patient body habitus. IMPRESSIONS  1. Left ventricular ejection fraction, by estimation, is 70 to 75%. The left ventricle has hyperdynamic function. The left ventricle has no regional wall motion abnormalities. Left ventricular diastolic parameters are indeterminate.  2. Right ventricular systolic function is hyperdynamic. The right ventricular size is severely enlarged.  3. The mitral valve was not well visualized. No evidence of mitral valve regurgitation. No evidence of mitral stenosis.  4. The aortic valve was not well visualized. Aortic valve regurgitation is not visualized.  5. The inferior vena cava is normal in size with greater than 50% respiratory variability, suggesting right atrial pressure of 3 mmHg. Comparison(s): Prior images reviewed side by side. Function is now hyperdynamic. Heart rate notably faster. FINDINGS  Left Ventricle: Left ventricular ejection fraction, by estimation, is 70 to 75%. The left ventricle has hyperdynamic function. The left ventricle has no regional wall motion abnormalities. Definity contrast agent was given IV to delineate the left ventricular endocardial borders. The left ventricular internal cavity size was normal in size. Suboptimal image quality limits for assessment of left ventricular hypertrophy. Left ventricular  diastolic parameters are indeterminate. Right Ventricle: The right ventricular size is severely enlarged. Right vetricular wall thickness was not well visualized. Right ventricular systolic function is hyperdynamic. Left Atrium: Left atrial size was not well visualized. Right Atrium: Right atrial size was not well visualized. Pericardium: There is no evidence of pericardial effusion. Mitral Valve: The mitral valve was not well visualized. No evidence of mitral valve regurgitation. No evidence of mitral valve stenosis. Tricuspid Valve: The tricuspid valve is normal in structure. Tricuspid valve regurgitation is not demonstrated. No evidence of tricuspid stenosis. Aortic Valve: The aortic valve was not well visualized. Aortic valve regurgitation is not visualized. Aortic valve mean gradient measures 5.0 mmHg. Aortic valve peak gradient measures 8.3 mmHg. Aortic valve area, by VTI measures 2.61 cm. Pulmonic Valve: The pulmonic valve was not well visualized. Pulmonic valve regurgitation is not visualized. No evidence of pulmonic stenosis. Aorta: The aortic root is normal in size and structure. Venous: The inferior vena cava is normal in size with greater than 50% respiratory variability, suggesting right atrial pressure of 3 mmHg. IAS/Shunts: The interatrial septum was not well visualized.  LEFT VENTRICLE PLAX 2D LVIDd:         4.00 cm LVIDs:         2.50 cm LV PW:         1.10 cm LV IVS:        1.20 cm LVOT diam:     1.90 cm LV SV:         48 LV SV Index:   19 LVOT Area:     2.84 cm  RIGHT VENTRICLE RV S prime:     14.40 cm/s TAPSE (M-mode): 2.1 cm LEFT ATRIUM             Index        RIGHT ATRIUM           Index LA diam:        3.50 cm 1.40 cm/m   RA Area:     17.50 cm LA Vol (A2C):   35.7 ml 14.25 ml/m  RA Volume:   47.80 ml  19.09 ml/m LA Vol (A4C):   37.9 ml 15.13 ml/m LA Biplane Vol: 39.7 ml 15.85 ml/m  AORTIC VALVE  AV Area (Vmax):    2.64 cm AV Area (Vmean):   2.46 cm AV Area (VTI):     2.61 cm AV  Vmax:           144.00 cm/s AV Vmean:          97.900 cm/s AV VTI:            0.185 m AV Peak Grad:      8.3 mmHg AV Mean Grad:      5.0 mmHg LVOT Vmax:         134.00 cm/s LVOT Vmean:        84.900 cm/s LVOT VTI:          0.170 m LVOT/AV VTI ratio: 0.92  SHUNTS Systemic VTI:  0.17 m Systemic Diam: 1.90 cm Riley Lam MD Electronically signed by Riley Lam MD Signature Date/Time: 03/24/2023/11:49:21 AM    Final    VAS [place] ABI WITH/WO TBI  Result Date: 03/23/2023  LOWER EXTREMITY DOPPLER STUDY Patient Name:  Taylor Hughes  Date of Exam:   03/23/2023 Medical Rec #: [id number]     Accession #:    [id number] Date of Birth: [birth year]      Patient Gender: M Patient Age:   55 years Exam Location:  [facility] Procedure:      VAS [place] ABI WITH/WO TBI Referring Phys: A POWELL JR --------------------------------------------------------------------------------  Indications: Ulceration, and peripheral artery disease. High Risk Factors: Diabetes.  Comparison Study: No prior study Performing Technologist: Shona Simpson  Examination Guidelines: A complete evaluation includes at minimum, Doppler waveform signals and systolic blood pressure reading at the level of bilateral brachial, anterior tibial, and posterior tibial arteries, when vessel segments are accessible. Bilateral testing is considered an integral part of a complete examination. Photoelectric Plethysmograph (PPG) waveforms and toe systolic pressure readings are included as required and additional duplex testing as needed. Limited examinations for reoccurring indications may be performed as noted.  ABI Findings: +--------+------------------+-----+---------+--------+ Right   Rt Pressure (mmHg)IndexWaveform Comment  +--------+------------------+-----+---------+--------+ [id number]                    triphasic         +--------+------------------+-----+---------+--------+ PTA     98                0.68 biphasic           +--------+------------------+-----+---------+--------+ DP      131               0.91 biphasic          +--------+------------------+-----+---------+--------+ +--------+------------------+-----+---------+-------+ Left    Lt Pressure (mmHg)IndexWaveform Comment +--------+------------------+-----+---------+-------+ [id number]                    triphasic        +--------+------------------+-----+---------+-------+ PTA     157               1.09 biphasic         +--------+------------------+-----+---------+-------+ DP      162               1.12 biphasic         +--------+------------------+-----+---------+-------+ TOES Findings:  Unable to insonate due to open wounds +---------+---------------+--------+-------+ Left ToesPressure (mmHg)WaveformComment +---------+---------------+--------+-------+ 3rd Digit               Normal          +---------+---------------+--------+-------+ Unable to assess other digits or test compression due to size/shape incompatibility  with sensor   Summary: Right: Resting right ankle-brachial index indicates mild right lower extremity arterial disease. Left: Resting left ankle-brachial index is within normal range. *See table(s) above for measurements and observations.  Electronically signed by Lemar Livings MD on 03/23/2023 at 6:58:35 PM.    Final    VAS [place] LOWER EXTREMITY VENOUS (DVT)  Result Date: 03/23/2023  Lower Venous DVT Study Patient Name:  Taylor Hughes  Date of Exam:   03/23/2023 Medical Rec #: [id number]     Accession #:    [id number] Date of Birth: 1971-01-22      Patient Gender: M Patient Age:   89 years Exam Location:  [facility] Procedure:      VAS [place] LOWER EXTREMITY VENOUS (DVT) Referring Phys: A POWELL JR --------------------------------------------------------------------------------  Indications: Edema, and ulceration.  Risk Factors: Limited mobility. Limitations: Body habitus. Comparison Study: No significant changes seen  since prior exam 10/03/21. Performing Technologist: Shona Simpson  Examination Guidelines: A complete evaluation includes B-mode imaging, spectral Doppler, color Doppler, and power Doppler as needed of all accessible portions of each vessel. Bilateral testing is considered an integral part of a complete examination. Limited examinations for reoccurring indications may be performed as noted. The reflux portion of the exam is performed with the patient in reverse Trendelenburg.  +---------+---------------+---------+-----------+---------------+--------------+ RIGHT    CompressibilityPhasicitySpontaneityProperties     Thrombus Aging +---------+---------------+---------+-----------+---------------+--------------+ CFV      Full           Yes      Yes                                      +---------+---------------+---------+-----------+---------------+--------------+ SFJ      Full                                                             +---------+---------------+---------+-----------+---------------+--------------+ FV Prox  Full                                                             +---------+---------------+---------+-----------+---------------+--------------+ FV Mid   Full                                                             +---------+---------------+---------+-----------+---------------+--------------+ FV DistalFull                                                             +---------+---------------+---------+-----------+---------------+--------------+ PFV      Full                                                             +---------+---------------+---------+-----------+---------------+--------------+  POP      Full           Yes      Yes                                      +---------+---------------+---------+-----------+---------------+--------------+ PTV      Full                                                              +---------+---------------+---------+-----------+---------------+--------------+ PERO                                        Patent by color               +---------+---------------+---------+-----------+---------------+--------------+   +----+---------------+---------+-----------+----------+--------------+ LEFTCompressibilityPhasicitySpontaneityPropertiesThrombus Aging +----+---------------+---------+-----------+----------+--------------+ CFV Full           Yes      Yes                                 +----+---------------+---------+-----------+----------+--------------+    Summary: RIGHT: - There is no evidence of deep vein thrombosis in the lower extremity.  - No cystic structure found in the popliteal fossa.  LEFT: - No evidence of common femoral vein obstruction.  *See table(s) above for measurements and observations. Electronically signed by Lemar Livings MD on 03/23/2023 at 6:56:49 PM.    Final    NM Pulmonary Perfusion  Result Date: 03/23/2023 CLINICAL DATA:  Tachycardia, RIGHT leg swelling EXAM: NUCLEAR MEDICINE PERFUSION LUNG SCAN TECHNIQUE: Perfusion images were obtained in multiple projections after intravenous injection of radiopharmaceutical. RADIOPHARMACEUTICALS:  4.2 mCi Tc-9m MAA COMPARISON:  03/23/2023 FINDINGS: No wedge-shaped peripheral perfusion defect noted in the LEFT or RIGHT lung to suggest acute pulmonary embolism. Cardiac attenuation noted. IMPRESSION: No evidence acute pulmonary embolism. Electronically Signed   By: Genevive Bi M.D.   On: 03/23/2023 16:13   US RENAL  Result Date: 03/23/2023 CLINICAL DATA:  Acute kidney injury EXAM: RENAL / URINARY TRACT ULTRASOUND COMPLETE COMPARISON:  None Available. FINDINGS: Right Kidney: Renal measurements: 11.8 x 5.8 x 5.2 cm = volume: 183 mL. Echogenicity within normal limits. No mass or hydronephrosis visualized. Left Kidney: Renal measurements: 12.0 x 6.7 x 6.3 cm = volume: 266 mL. Echogenicity within normal  limits. No mass or hydronephrosis visualized. Bladder: Not well visualized due to underdistention. Other: None. IMPRESSION: No hydronephrosis. Electronically Signed   By: Allegra Lai M.D.   On: 03/23/2023 13:49   DG Chest Port 1 View  Result Date: 03/23/2023 CLINICAL DATA:  Shortness of breath EXAM: PORTABLE CHEST 1 VIEW COMPARISON:  08/14/2022 FINDINGS: Cardiomegaly. Diffuse bilateral interstitial pulmonary opacity. The visualized skeletal structures are unremarkable. IMPRESSION: Cardiomegaly with diffuse bilateral interstitial pulmonary opacity, consistent with edema or atypical/viral infection. No focal airspace opacity. Electronically Signed   By: Jearld Lesch M.D.   On: 03/23/2023 12:24      Signature  -   Susa Raring M.D on 03/27/2023 at 9:54 AM   -  To page go to [email or web address]

## 2023-03-28 DIAGNOSIS — I517 Cardiomegaly: Secondary | ICD-10-CM | POA: Diagnosis not present

## 2023-03-28 DIAGNOSIS — E1169 Type 2 diabetes mellitus with other specified complication: Secondary | ICD-10-CM

## 2023-03-28 DIAGNOSIS — E11628 Type 2 diabetes mellitus with other skin complications: Secondary | ICD-10-CM | POA: Diagnosis not present

## 2023-03-28 DIAGNOSIS — M86271 Subacute osteomyelitis, right ankle and foot: Secondary | ICD-10-CM | POA: Diagnosis not present

## 2023-03-28 DIAGNOSIS — I890 Lymphedema, not elsewhere classified: Secondary | ICD-10-CM | POA: Diagnosis not present

## 2023-03-28 LAB — CBC WITH DIFFERENTIAL/PLATELET
Abs Immature Granulocytes: 0.08 10*3/uL — ABNORMAL HIGH (ref 0.00–0.07)
Basophils Absolute: 0 10*3/uL (ref 0.0–0.1)
Basophils Relative: 0 %
Eosinophils Absolute: 0.3 10*3/uL (ref 0.0–0.5)
Eosinophils Relative: 3 %
HCT: 23.8 % — ABNORMAL LOW (ref 39.0–52.0)
Hemoglobin: 7.6 g/dL — ABNORMAL LOW (ref 13.0–17.0)
Immature Granulocytes: 1 %
Lymphocytes Relative: 16 %
Lymphs Abs: 1.4 10*3/uL (ref 0.7–4.0)
MCH: 27.5 pg (ref 26.0–34.0)
MCHC: 31.9 g/dL (ref 30.0–36.0)
MCV: 86.2 fL (ref 80.0–100.0)
Monocytes Absolute: 0.7 10*3/uL (ref 0.1–1.0)
Monocytes Relative: 9 %
Neutro Abs: 6 10*3/uL (ref 1.7–7.7)
Neutrophils Relative %: 71 %
Platelets: 507 10*3/uL — ABNORMAL HIGH (ref 150–400)
RBC: 2.76 MIL/uL — ABNORMAL LOW (ref 4.22–5.81)
RDW: 15.3 % (ref 11.5–15.5)
WBC: 8.5 10*3/uL (ref 4.0–10.5)
nRBC: 0 % (ref 0.0–0.2)

## 2023-03-28 LAB — GLUCOSE, CAPILLARY
Glucose-Capillary: 170 mg/dL — ABNORMAL HIGH (ref 70–99)
Glucose-Capillary: 142 mg/dL — ABNORMAL HIGH (ref 70–99)
Glucose-Capillary: 110 mg/dL — ABNORMAL HIGH (ref 70–99)
Glucose-Capillary: 182 mg/dL — ABNORMAL HIGH (ref 70–99)

## 2023-03-28 LAB — BASIC METABOLIC PANEL
Anion gap: 11 (ref 5–15)
BUN: 16 mg/dL (ref 6–20)
CO2: 22 mmol/L (ref 22–32)
Calcium: 8.1 mg/dL — ABNORMAL LOW (ref 8.9–10.3)
Chloride: 102 mmol/L (ref 98–111)
Creatinine, Ser: 1.1 mg/dL (ref 0.61–1.24)
GFR, Estimated: >60 mL/min (ref >60)
Glucose, Bld: 190 mg/dL — ABNORMAL HIGH (ref 70–99)
Potassium: 3.7 mmol/L (ref 3.5–5.1)
Sodium: 135 mmol/L (ref 135–145)

## 2023-03-28 LAB — MAGNESIUM: Magnesium: 1.9 mg/dL (ref 1.7–2.4)

## 2023-03-28 LAB — BRAIN NATRIURETIC PEPTIDE: B Natriuretic Peptide: 314.3 pg/mL — ABNORMAL HIGH (ref 0.0–100.0)

## 2023-03-28 LAB — PROCALCITONIN: Procalcitonin: 0.28 ng/mL

## 2023-03-28 LAB — C-REACTIVE PROTEIN: CRP: 15.7 mg/dL — ABNORMAL HIGH (ref <1.0)

## 2023-03-28 MED ORDER — INSULIN GLARGINE-YFGN 100 UNIT/ML ~~LOC~~ SOLN
22.0000 [IU] | Freq: Two times a day (BID) | SUBCUTANEOUS | Status: DC
  Administered 2023-03-28 – 2023-04-03 (×13): 22 [IU] via SUBCUTANEOUS
  Filled 2023-03-28 (×14): qty 0.22

## 2023-03-28 MED ORDER — SODIUM CHLORIDE 0.9 % IV SOLN: INTRAVENOUS | Status: DC | PRN

## 2023-03-28 NOTE — Plan of Care (Signed)

## 2023-03-28 NOTE — Evaluation (Addendum)
Physical Therapy Evaluation Patient Details Name: Taylor Hughes MRN: [id number] DOB: [birth year] Today's Date: 03/28/2023  History of Present Illness  52 year old male presents 03/23/23 with infection of his right lower extremity. 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  Clinical Impression   Pt admitted secondary to problem above with deficits below. 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 Pt currently requires min assist for transfers and CGA for ambulation with RW and post-op shoe. Patient not able to maintain NWB RLE, however per ortho note (and pt reports Dr [name] told him same) he may put pressure through heel if needed to ambulate. Patient today reports he can enter apartment by another route where he only has to step up a curb (and avoid the 4 step entrance). Will practice with step 03/29/23.  Anticipate patient will benefit from PT to address problems listed below.Will continue to follow acutely to maximize functional mobility independence and safety.           If plan is discharge home, recommend the following: Assistance with cooking/housework;Help with stairs or ramp for entrance   Can travel by private vehicle        Equipment Recommendations Rolling walker (2 wheels)  Recommendations for Other Services       Functional Status Assessment Patient has had a recent decline in their functional status and demonstrates the ability to make significant improvements in function in a reasonable and predictable amount of time.     Precautions / Restrictions Precautions Precautions: Fall Precaution Comments: R post-op shoe Restrictions Weight Bearing Restrictions: Yes RLE Weight Bearing: Non weight bearing Other Position/Activity Restrictions: able to put weight on it for ambulation with post op shoe per ortho note      Mobility  Bed Mobility Overal bed mobility: Needs Assistance Bed  Mobility: Supine to Sit, Sit to Supine     Supine to sit: Modified independent (Device/Increase time), HOB elevated Sit to supine: Modified independent (Device/Increase time)        Transfers Overall transfer level: Needs assistance Equipment used: Rolling walker (2 wheels) Transfers: Sit to/from Stand Sit to Stand: Min assist           General transfer comment: min A and max effort from Pt using RW, Pt motivated to complete STS on his own    Ambulation/Gait Ambulation/Gait assistance: Contact guard assist Gait Distance (Feet): 12 Feet (toileted, 12 ft) Assistive device: Rolling walker (2 wheels) Gait Pattern/deviations: Step-to pattern   Gait velocity interpretation: 1.31 - 2.62 ft/sec, indicative of limited community ambulator   General Gait Details: vc for sequencing with RW and Rt foot leading with weight through his heel in post-op shoe  Stairs            Wheelchair Mobility     Tilt Bed    Modified Rankin (Stroke Patients Only)       Balance Overall balance assessment: Needs assistance Sitting-balance support: Feet supported Sitting balance-Leahy Scale: Fair Sitting balance - Comments: EOB   Standing balance support: During functional activity, Reliant on assistive device for balance Standing balance-Leahy Scale: Poor Standing balance comment: reliant on RW                             Pertinent Vitals/Pain Pain Assessment Pain Assessment: 0-10 Pain Score: 8  Breathing: normal Negative Vocalization: none Facial Expression: smiling or inexpressive Body Language: relaxed Consolability: no need to  console PAINAD Score: 0 Pain Location: R foot Pain Descriptors / Indicators: Aching, Operative site guarding Pain Intervention(s): Limited activity within patient's tolerance    Home Living Family/patient expects to be discharged to:: Private residence Living Arrangements: Other relatives Available Help at Discharge: Family;Available  PRN/intermittently Type of Home: House Home Access: Stairs to enter Entrance Stairs-Rails: None Entrance Stairs-Number of Steps: 1 (pt reports can approach apartment another way with only curb to step up)   Home Layout: One level Home Equipment: None Additional Comments: Pt states he has no DME at home    Prior Function Prior Level of Function : Independent/Modified Independent             Mobility Comments: independent ADLs Comments: independent; works as a Network engineer Extremity Assessment: Overall WFL for tasks assessed    Lower Extremity Assessment Lower Extremity Assessment: RLE deficits/detail;LLE deficits/detail RLE Deficits / Details: +lymphedema; new Rt transmet LLE Deficits / Details: +lymphedema    Cervical / Trunk Assessment Cervical / Trunk Assessment: Other exceptions Cervical / Trunk Exceptions: overweight  Communication   Communication Communication: No apparent difficulties  Cognition Arousal: Alert Behavior During Therapy: WFL for tasks assessed/performed, Anxious Overall Cognitive Status: Within Functional Limits for tasks assessed                                 General Comments: Pt somewhat anxious at beginning of session, but actively participate in therapy        General Comments      Exercises     Assessment/Plan    PT Assessment Patient needs continued PT services  PT Problem List Decreased activity tolerance;Decreased balance;Decreased mobility;Decreased knowledge of use of DME;Decreased knowledge of precautions;Obesity       PT Treatment Interventions DME instruction;Gait training;Stair training;Functional mobility training;Therapeutic activities;Therapeutic exercise;Patient/family education    PT Goals (Current goals can be found in the Care Plan section)  Acute Rehab PT Goals Patient Stated Goal: be able to walk to the bathroom on his own PT Goal  Formulation: With patient Time For Goal Achievement: 04/11/23 Potential to Achieve Goals: Good    Frequency Min 1X/week     Co-evaluation               AM-PAC PT "6 Clicks" Mobility  Outcome Measure Help needed turning from your back to your side while in a flat bed without using bedrails?: None Help needed moving from lying on your back to sitting on the side of a flat bed without using bedrails?: A Little (If HOB flat and no rail) Help needed moving to and from a bed to a chair (including a wheelchair)?: A Little Help needed standing up from a chair using your arms (e.g., wheelchair or bedside chair)?: A Little Help needed to walk in hospital room?: A Little Help needed climbing 3-5 steps with a railing? : A Little 6 Click Score: 19    End of Session   Activity Tolerance: Patient tolerated treatment well Patient left: in bed;with call bell/phone within reach Nurse Communication: Mobility status;Weight bearing status PT Visit Diagnosis: Difficulty in walking, not elsewhere classified (R26.2);Pain Pain - Right/Left: Right Pain - part of body: Ankle and joints of foot    Time: 1610-9604 PT Time Calculation (min) (ACUTE ONLY): 40 min   Charges:   PT Evaluation $PT Eval Low Complexity: 1 Low PT Treatments $Gait Training:  8-22 mins PT General Charges $$ ACUTE PT VISIT: 1 Visit          Jerolyn Center, PT Acute Rehabilitation Services  Office [phone]   Zena Amos 03/28/2023, 10:19 AM

## 2023-03-28 NOTE — Progress Notes (Signed)
Orthopedic Tech Progress Note Patient Details:  Taylor Hughes Mar 24, 1971 [id number]  Contact by Larita Fife, PT re: PO shoe strap not fitting around his ankle. I fit a velcro extension strap to pt's current shoe which allowed for the ankle strap to be fastened. I also contacted [facility] re: availability of bariatric PO shoe options to accommodate this pt's foot/ankle, but was informed they do not have anything of that nature. Secure chat sent to Larita Fife to let her know the shoe had been adjusted.   Patient ID: Taylor Hughes, male   DOB: 1970-10-11, 52 y.o.   MRN: [id number]  Docia Furl 03/28/2023, 7:10 AM

## 2023-03-28 NOTE — Progress Notes (Signed)
Occupational Therapy Treatment Patient Details Name: Taylor Hughes MRN: [id number] DOB: 1971/05/04 Today's Date: 03/28/2023   History of present illness 52 year old male presents 03/23/23 with infection of his right lower extremity. 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   OT comments  OT instructed pt in techniques for increased safety and independence with ADLs including use of AE for LB dressing and in techniques for increased safety and independence with functional transfers. Pt now demonstrates ability to complete UB ADLs Independent to Mod I in sitting, LB ADLs Supervision to Contact guard assist in sit/stand with use of AE, and functional transfers with a RW with Contact guard assist. Pt participated well in session and is making good progress toward OT goals. Pt will benefit from continued acute skilled OT services to address deficits and increase safety and independence with functional tasks. OT discharge and equipment recommendations updated this day due to pt making good progress. Post discharge, pt will benefit from continued skilled OT services in the home, including a home safety assessment, to maximize rehab potential.       If plan is discharge home, recommend the following:  A little help with walking and/or transfers;A little help with bathing/dressing/bathroom;Assistance with cooking/housework;Assist for transportation;Help with stairs or ramp for entrance   Equipment Recommendations  Tub/shower bench;BSC/3in1    Recommendations for Other Services      Precautions / Restrictions Precautions Precautions: Fall Precaution Comments: R post-op shoe Restrictions Weight Bearing Restrictions: Yes RLE Weight Bearing: Non weight bearing Other Position/Activity Restrictions: able to put weight on it for ambulation with post op shoe per ortho note       Mobility Bed Mobility Overal bed mobility:  Needs Assistance Bed Mobility: Supine to Sit, Sit to Supine     Supine to sit: Modified independent (Device/Increase time), HOB elevated Sit to supine: Supervision        Transfers Overall transfer level: Needs assistance Equipment used: Rolling walker (2 wheels) Transfers: Sit to/from Stand, Bed to chair/wheelchair/BSC Sit to Stand: Contact guard assist, Min assist     Step pivot transfers: Contact guard assist           Balance Overall balance assessment: Needs assistance Sitting-balance support: No upper extremity supported, Feet supported Sitting balance-Leahy Scale: Fair Sitting balance - Comments: EOB   Standing balance support: Single extremity supported, Bilateral upper extremity supported, During functional activity, Reliant on assistive device for balance Standing balance-Leahy Scale: Poor Standing balance comment: reliant on RW                           ADL either performed or assessed with clinical judgement   ADL Overall ADL's : Needs assistance/impaired Eating/Feeding: Independent;Sitting   Grooming: Independent;Sitting   Upper Body Bathing: Modified independent;Sitting   Lower Body Bathing: Contact guard assist;Cueing for compensatory techniques;Sit to/from stand   Upper Body Dressing : Modified independent;Sitting Upper Body Dressing Details (indicate cue type and reason): assistance to manage telemetry cords Lower Body Dressing: Supervision/safety;Contact guard assist;Cueing for compensatory techniques;Sit to/from stand;With adaptive equipment Lower Body Dressing Details (indicate cue type and reason): to donn/doff R post-op shoe and L sock and shoe with use of reacher and long handled shoe horn and for simulated task of donning/doffing pants with use of reacher Toilet Transfer: Contact guard assist;Minimal assistance;BSC/3in1;Rolling walker (2 wheels) (step-pivot)   Toileting- Clothing Manipulation and Hygiene: Contact guard assist;Cueing  for compensatory techniques;Sit to/from stand  General ADL Comments: Pt requiring mod cues for compensatory techniques and occasional cues for increased safety during funcitonal tasks.    Extremity/Trunk Assessment Upper Extremity Assessment Upper Extremity Assessment: Overall WFL for tasks assessed   Lower Extremity Assessment Lower Extremity Assessment: Defer to PT evaluation        Vision       Perception     Praxis      Cognition Arousal: Alert Behavior During Therapy: WFL for tasks assessed/performed, Anxious Overall Cognitive Status: Within Functional Limits for tasks assessed                                 General Comments: AAOx4 and pleasant throughout session        Exercises      Shoulder Instructions       General Comments VSS on RA throughout session.    Pertinent Vitals/ Pain       Pain Assessment Pain Assessment: Faces Faces Pain Scale: Hurts a little bit Pain Location: R foot Pain Descriptors / Indicators: Aching, Operative site guarding Pain Intervention(s): Limited activity within patient's tolerance, Monitored during session, Repositioned  Home Living                                          Prior Functioning/Environment              Frequency  Min 1X/week        Progress Toward Goals  OT Goals(current goals can now be found in the care plan section)  Progress towards OT goals: Progressing toward goals  Acute Rehab OT Goals Patient Stated Goal: To return home  Plan      Co-evaluation                 AM-PAC OT "6 Clicks" Daily Activity     Outcome Measure   Help from another person eating meals?: None Help from another person taking care of personal grooming?: None (in sitting) Help from another person toileting, which includes using toliet, bedpan, or urinal?: A Little Help from another person bathing (including washing, rinsing, drying)?: A Little Help from another  person to put on and taking off regular upper body clothing?: None (in sitting) Help from another person to put on and taking off regular lower body clothing?: A Little 6 Click Score: 21    End of Session Equipment Utilized During Treatment: Gait belt;Rolling walker (2 wheels);Other (comment) (reacher, long handled shoe horm)  OT Visit Diagnosis: Unsteadiness on feet (R26.81);Other abnormalities of gait and mobility (R26.89)   Activity Tolerance Patient tolerated treatment well   Patient Left in bed;with call bell/phone within reach;with bed alarm set   Nurse Communication Mobility status;Other (comment) (Pt neads new lead stickers as current ones are frequently falling off during movement. Pt bandage on R foot loose around heel)        Time: 1610-9604 OT Time Calculation (min): 46 min  Charges: OT General Charges $OT Visit: 1 Visit OT Treatments $Self Care/Home Management : 38-52 mins  Taylor Hughes "Orson Eva., OTR/L, MA Acute Rehab [phone]   Lendon Colonel 03/28/2023, 6:51 PM

## 2023-03-28 NOTE — Progress Notes (Signed)
PROGRESS NOTE                                                                                                                                                                                                             Patient Demographics:    Taylor Hughes, is a 52 y.o. male, DOB - November 09, 1970, BJS:[id number]  Outpatient Primary MD for the patient is Hoy Register, MD    LOS - 5  Admit date - 03/22/2023    Chief Complaint  Patient presents with   Wound Infection   Hyperglycemia       Brief Narrative (HPI from H&P)   52 year old male with past medical history of T2DM, peripheral neuropathy, atrial fibrillation, HTN, and GERD. He presents with infection of his right lower extremity . Admitted with diabetic foot infection/osteo.  He was seen by orthopedics and cardiology, transferred to my service on 03/22/2023.   Subjective:   Patient in bed, appears comfortable, denies any headache, no fever, no chest pain or pressure, no shortness of breath , no abdominal pain. No focal weakness.   Assessment  & Plan :    Sepsis due to right diabetic foot ulcer present on admission.  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   Intermittent SVT and sinus tachycardia.  Due to #1 above.  Echocardiogram noted with preserved left sided function, stable TSH, CTA negative for PE, after amputation and supportive care for infection tachycardia much  improved.  Right lower extremity edema.  Present for the last month secondary to the infection, lower extremity venous duplex although limited but negative.  Essential hypertension.  Pressure on the softer side, improved after IV fluids and midodrine, now tolerating beta-blocker, start tapering off midodrine and monitor.  AKI.  Due to sepsis.  Hydrate and monitor hold nephrotoxins, stable renal ultrasound, improving, stop IV fluids and monitor.  Avoid nephrotoxins.  Anemia.  Combination of heme dilution, sepsis and iron deficiency on anemia panel, placed on  oral iron and folic acid, type screen done, continue to monitor  Dyslipidemia.  On statin.  Vomiting due to gastroparesis.  Much improved with supportive care.  Morbid obesity.  BMI 41.  Follow-up with PCP for weight loss.  DM type II.  On Lantus and SSI.  Was adjusted further on 03/27/2023 for better control.  Getting diabetic and insulin education.  Lab Results  Component Value Date   HGBA1C 9.8 (H) 03/23/2023   CBG (last 3)  Recent Labs    03/27/23 1622 03/27/23 2117 03/28/23 0808  GLUCAP 202* 235* 170*         Condition - Fair  Family Communication  :  None  Code Status :  Full  Consults  :  Cards, Ortho, ID  PUD Prophylaxis : PPI   Procedures  :     Right arm PICC line.    Right foot transmetatarsal amputation by Dr. [name] on 03/25/2023.  Echocardiogram.  1. Left ventricular ejection fraction, by estimation, is 70 to 75%. The left ventricle has hyperdynamic function. The left ventricle has no regional wall motion abnormalities. Left ventricular diastolic parameters are indeterminate.  2. Right ventricular systolic function is hyperdynamic. The right ventricular size is severely enlarged.  3. The mitral valve was not well visualized. No evidence of mitral valve regurgitation. No evidence of mitral stenosis.  4. The aortic valve was not well visualized. Aortic valve regurgitation is not visualized.  5. The  inferior vena cava is normal in size with greater than 50% respiratory variability, suggesting right atrial pressure of 3 mmHg.    CTA chest. 1. No CT evidence of pulmonary embolism. 2. Small bilateral pleural effusions with minimal compressive atelectasis of the lung bases. Pneumonia is not excluded.  Renal ultrasound.  Nonacute.    Lower extremity venous duplex.  No DVT.    ABI - Right: Resting right ankle-brachial index indicates mild right lower extremity arterial disease. Left: Resting left ankle-brachial index is within normal range. *See table(s) above for measurements and observations.      Disposition Plan  :    Status is: Inpatient   DVT Prophylaxis  :    SCD's Start: 03/25/23 1501 heparin injection 5,000 Units Start: 03/23/23 1800    Lab Results  Component Value Date   PLT 507 (H) 03/28/2023    Diet :  Diet Order             Diet Carb Modified Fluid consistency: Thin; Room service appropriate? Yes  Diet effective now                    Inpatient Medications  Scheduled Meds:  vitamin C  1,000 mg Oral Daily   aspirin EC  81 mg Oral Daily   atorvastatin  20 mg Oral Daily   buPROPion  150 mg Oral Daily   docusate sodium  200 mg Oral BID   ferrous sulfate  325 mg Oral BID WC   folic acid  1 mg Oral Daily   gabapentin  300 mg Oral BID   heparin injection (subcutaneous)  5,000 Units Subcutaneous Q8H   insulin aspart  0-15 Units Subcutaneous TID WC   insulin aspart  0-5 Units Subcutaneous QHS   insulin glargine-yfgn  22 Units Subcutaneous BID   metoCLOPramide (REGLAN) injection  10 mg Intravenous Q6H   metoprolol tartrate  25 mg Oral BID   metroNIDAZOLE  500 mg Oral Q12H   midodrine  5 mg Oral BID WC  mupirocin ointment  1 Application Nasal BID   nutrition supplement (JUVEN)  1 packet Oral BID BM   pantoprazole  40 mg Oral Daily   zinc sulfate  220 mg Oral Daily   Continuous Infusions:  cefTRIAXone (ROCEPHIN)  IV Stopped (03/27/23 0920)    DAPTOmycin (CUBICIN) 750 mg in sodium chloride 0.9 % IVPB Stopped (03/27/23 1348)   magnesium sulfate bolus IVPB     PRN Meds:.acetaminophen **OR** acetaminophen, acetaminophen, alum & mag hydroxide-simeth, bisacodyl, guaiFENesin-dextromethorphan, hydrALAZINE, labetalol, magnesium citrate, magnesium sulfate bolus IVPB, metoprolol tartrate, metoprolol tartrate, morphine injection, ondansetron, oxyCODONE, phenol, polyethylene glycol, potassium chloride, senna-docusate, simethicone    Objective:   Vitals:   03/27/23 2000 03/27/23 2324 03/28/23 0351 03/28/23 0818  BP:  120/71 118/71 126/65  Pulse:  82 77 87  Resp: 20 (!) 21 (!) 21 (!) 22  Temp:  98.3 F (36.8 C) 98.3 F (36.8 C) 98.8 F (37.1 C)  TempSrc:  Oral Oral Oral  SpO2:  98% 95% 96%  Weight:   (!) 146.6 kg   Height:        Wt Readings from Last 3 Encounters:  03/28/23 (!) 146.6 kg  01/19/23 135.9 kg  09/08/22 133.1 kg     Intake/Output Summary (Last 24 hours) at 03/28/2023 0936 Last data filed at 03/28/2023 0130 Gross per 24 hour  Intake 2729 ml  Output 400 ml  Net 2329 ml     Physical Exam  Awake Alert, No new F.N deficits, Normal affect [place].AT,PERRAL Supple Neck, No JVD,   Symmetrical Chest wall movement, Good air movement bilaterally, CTAB RRR,No Gallops,Rubs or new Murmurs,  +ve B.Sounds, Abd Soft, No tenderness,   R TMA under bandage - W.Vac, improving right lower extremity edema and swelling, Right arm PICC line    Data Review:    Recent Labs  Lab 03/23/23 0227 03/24/23 0316 03/25/23 0109 03/26/23 0326 03/27/23 0658 03/28/23 0207  WBC 19.1* 17.1* 15.5* 13.2* 9.1 8.5  HGB 8.2* 8.5* 7.7* 7.8* 7.5* 7.6*  HCT 25.8* 26.0* 23.6* 24.5* 23.3* 23.8*  PLT 469* 465* 461* 470* 470* 507*  MCV 85.4 84.1 83.4 83.9 86.6 86.2  MCH 27.2 27.5 27.2 26.7 27.9 27.5  MCHC 31.8 32.7 32.6 31.8 32.2 31.9  RDW 14.9 14.6 14.6 15.0 15.2 15.3  LYMPHSABS 0.9 1.4  --  1.7 1.2 1.4  MONOABS 1.0 1.3*  --  1.0 0.8 0.7   EOSABS 0.0 0.0  --  0.2 0.3 0.3  BASOSABS 0.0 0.0  --  0.0 0.0 0.0    Recent Labs  Lab 03/22/23 2023 03/22/23 2151 03/23/23 0005 03/23/23 0227 03/23/23 1859 03/24/23 0316 03/25/23 0109 03/25/23 0819 03/26/23 0326 03/27/23 0658 03/28/23 0207  NA 130*  --   --  132*  --  137 134*  --  136 135 135  K 5.0  --   --  4.3  --  3.6 3.8  --  3.9 3.8 3.7  CL 99  --   --  102  --  106 102  --  103 103 102  CO2 11*  --   --  12*  --  22 24  --  22 23 22   ANIONGAP 20*  --   --  18*  --  9 8  --  11 9 11   GLUCOSE 405*  --   --  410*  --  126* 205*  --  210* 275* 190*  BUN 26*  --   --  28*  --  21* 19  --  22* 20 16  CREATININE 1.52*  --   --  1.70*  --  1.41* 1.27*  --  1.46* 1.35* 1.10  AST 14*  --   --  11*  --  14*  --   --   --   --   --   ALT 15  --   --  13  --  14  --   --   --   --   --   ALKPHOS 64  --   --  56  --  50  --   --   --   --   --   BILITOT 1.2  --   --  1.1  --  0.4  --   --   --   --   --   ALBUMIN 2.1*  --   --  1.8*  --  1.6*  --   --   --   --   --   CRP  --  30.8*  --   --   --   --   --  27.2* 25.5* 19.2* 15.7*  PROCALCITON  --   --   --   --   --   --   --  0.62 0.60 0.36 0.28  LATICACIDVEN  --  1.3 1.3 1.2  --   --   --   --   --   --   --   INR  --   --   --   --   --   --   --  1.2  --   --   --   TSH  --   --   --   --   --  1.058  --   --   --   --   --   HGBA1C  --   --  9.8*  --   --   --   --   --   --   --   --   BNP  --   --   --   --  462.8*  --   --  524.5* 218.5* 320.4* 314.3*  MG  --   --   --  1.9  --  1.7  --   --  1.9 2.1 1.9  CALCIUM 8.7*  --   --  8.7*  --  8.2* 7.8*  --  8.1* 8.1* 8.1*     Recent Labs  Lab 03/22/23 2151 03/23/23 0005 03/23/23 0227 03/23/23 1859 03/24/23 0316 03/25/23 0109 03/25/23 0819 03/26/23 0326 03/27/23 0658 03/28/23 0207  CRP 30.8*  --   --   --   --   --  27.2* 25.5* 19.2* 15.7*  PROCALCITON  --   --   --   --   --   --  0.62 0.60 0.36 0.28  LATICACIDVEN 1.3 1.3 1.2  --   --   --   --   --   --   --    INR  --   --   --   --   --   --  1.2  --   --   --   TSH  --   --   --   --  1.058  --   --   --   --   --   HGBA1C  --  9.8*  --   --   --   --   --   --   --   --  BNP  --   --   --  462.8*  --   --  524.5* 218.5* 320.4* 314.3*  MG  --   --  1.9  --  1.7  --   --  1.9 2.1 1.9  CALCIUM  --   --  8.7*  --  8.2* 7.8*  --  8.1* 8.1* 8.1*    Micro Results Recent Results (from the past 240 hour(s))  Blood culture (routine x 2)     Status: None   Collection Time: 03/22/23  8:23 PM   Specimen: BLOOD RIGHT HAND  Result Value Ref Range Status   Specimen Description BLOOD RIGHT HAND  Final   Special Requests   Final    BOTTLES DRAWN AEROBIC AND ANAEROBIC Blood Culture adequate volume   Culture   Final    NO GROWTH 5 DAYS Performed at [facility], [address] [place], [place] [address]    Report Status 03/27/2023 FINAL  Final  Blood culture (routine x 2)     Status: None   Collection Time: 03/22/23  9:09 PM   Specimen: BLOOD  Result Value Ref Range Status   Specimen Description BLOOD SITE NOT SPECIFIED  Final   Special Requests   Final    BOTTLES DRAWN AEROBIC ONLY Blood Culture adequate volume   Culture   Final    NO GROWTH 5 DAYS Performed at [facility], [address], [place], [place] [address]    Report Status 03/27/2023 FINAL  Final  SARS Coronavirus 2 by RT PCR (hospital order, performed in [facility] hospital lab) *cepheid single result test* Anterior Nasal Swab     Status: None   Collection Time: 03/23/23  5:20 PM   Specimen: Anterior Nasal Swab  Result Value Ref Range Status   SARS Coronavirus 2 by RT PCR NEGATIVE NEGATIVE Final    Comment: Performed at [facility], [address] [place], [place] [address]  Surgical PCR screen     Status: Abnormal   Collection Time: 03/24/23  5:30 PM   Specimen: Nasal Mucosa; Nasal Swab  Result Value Ref Range Status   MRSA, PCR NEGATIVE NEGATIVE Final   Staphylococcus aureus POSITIVE (A) NEGATIVE Final     Comment: (NOTE) The Xpert SA Assay (FDA approved for NASAL specimens in patients 57 years of age and older), is one component of a comprehensive surveillance program. It is not intended to diagnose infection nor to guide or monitor treatment. Performed at [facility], [address], [place], [place] [address]   MRSA Next Gen by PCR, Nasal     Status: None   Collection Time: 03/25/23  6:40 AM   Specimen: Nasal Mucosa; Nasal Swab  Result Value Ref Range Status   MRSA by PCR Next Gen NOT DETECTED NOT DETECTED Final    Comment: (NOTE) The GeneXpert MRSA Assay (FDA approved for NASAL specimens only), is one component of a comprehensive MRSA colonization surveillance program. It is not intended to diagnose MRSA infection nor to guide or monitor treatment for MRSA infections. Test performance is not FDA approved in patients less than 9 years old. Performed at [facility], [address], [place], [place] [address]   Aerobic/Anaerobic Culture w Gram Stain (surgical/deep wound)     Status: None (Preliminary result)   Collection Time: 03/25/23 12:30 PM   Specimen: Soft Tissue, Other  Result Value Ref Range Status   Specimen Description TISSUE RIGHT TRANSMETATARSAL  Final   Special Requests A  Final   Gram Stain NO WBC SEEN RARE GRAM POSITIVE COCCI IN PAIRS   Final   Culture   Final    HOLDING FOR POSSIBLE ANAEROBE Performed at [facility], [address] [place], [place] [address]    Report Status PENDING  Incomplete    Radiology Reports DG Chest Port 1 View  Result Date: 03/25/2023 CLINICAL DATA:  Shortness of breath. EXAM: PORTABLE CHEST 1 VIEW COMPARISON:  03/23/2023 FINDINGS: Low volume film. Cardiopericardial silhouette is at upper limits of normal for size. There is pulmonary vascular congestion. Persistent diffuse interstitial prominence may be chronic but component of interstitial edema is not excluded. No dense focal airspace consolidation or evidence  of pleural effusion. IMPRESSION: Low volume film with pulmonary vascular congestion and diffuse interstitial prominence. Interstitial edema not excluded. Electronically Signed   By: Kennith Center M.D.   On: 03/25/2023 06:14   CT Angio Chest Pulmonary Embolism (PE) W or WO Contrast  Result Date: 03/24/2023 CLINICAL DATA:  Concern for pulmonary embolism. EXAM: CT ANGIOGRAPHY CHEST WITH CONTRAST TECHNIQUE: Multidetector CT imaging of the chest was performed using the standard protocol during bolus administration of intravenous contrast. Multiplanar CT image reconstructions and MIPs were obtained to evaluate the vascular anatomy. RADIATION DOSE REDUCTION: This exam was performed according to the departmental dose-optimization program which includes automated exposure control, adjustment of the mA and/or kV according to patient size and/or use of iterative reconstruction technique. CONTRAST:  75mL OMNIPAQUE IOHEXOL 350 MG/ML SOLN COMPARISON:  Chest CT dated 05/23/2019. FINDINGS: Cardiovascular: There is no cardiomegaly or pericardial effusion. There is coronary vascular calcification. The thoracic aorta is unremarkable. Evaluation of the pulmonary arteries is limited due to respiratory motion. No pulmonary artery embolus identified. Mediastinum/Nodes: No hilar or mediastinal adenopathy. The esophagus is grossly unremarkable. No mediastinal fluid collection. Lungs/Pleura: Small bilateral pleural effusions. Minimal compressive atelectasis of the lung bases. Pneumonia is not excluded. There is no pneumothorax. The central airways are patent. Upper Abdomen: No acute abnormality. Musculoskeletal: Mild subcutaneous edema. No acute osseous pathology. Review of the MIP images confirms the above findings. IMPRESSION: 1. No CT evidence of pulmonary embolism. 2. Small bilateral pleural effusions with minimal compressive atelectasis of the lung bases. Pneumonia is not excluded. Electronically Signed   By: Elgie Collard M.D.    On: 03/24/2023 20:06   ECHOCARDIOGRAM COMPLETE  Result Date: 03/24/2023    ECHOCARDIOGRAM REPORT   Patient Name:   Taylor Hughes Date of Exam: 03/24/2023 Medical Rec #:  [id number]    Height:       71.0 in Accession #:    [id number]   Weight:       299.8 lb Date of Birth:  09-13-1970     BSA:          2.504 m Patient Age:    52 years     BP:           114/53 mmHg Patient Gender: M            HR:           129 bpm. Exam Location:  Inpatient Procedure: 2D Echo, Cardiac Doppler, Color Doppler and Intracardiac            Opacification Agent Indications:    R00.8 Other abnormalities of heart beat  History:        Patient has prior history of Echocardiogram examinations, most  recent 08/15/2022. Arrythmias:Atrial Fibrillation; Risk                 Factors:Diabetes, Dyslipidemia and Hypertension.  Sonographer:    Milbert Coulter Referring Phys: [id number] A CALDWELL POWELL JR  Sonographer Comments: Patient is obese. Image acquisition challenging due to patient body habitus. IMPRESSIONS  1. Left ventricular ejection fraction, by estimation, is 70 to 75%. The left ventricle has hyperdynamic function. The left ventricle has no regional wall motion abnormalities. Left ventricular diastolic parameters are indeterminate.  2. Right ventricular systolic function is hyperdynamic. The right ventricular size is severely enlarged.  3. The mitral valve was not well visualized. No evidence of mitral valve regurgitation. No evidence of mitral stenosis.  4. The aortic valve was not well visualized. Aortic valve regurgitation is not visualized.  5. The inferior vena cava is normal in size with greater than 50% respiratory variability, suggesting right atrial pressure of 3 mmHg. Comparison(s): Prior images reviewed side by side. Function is now hyperdynamic. Heart rate notably faster. FINDINGS  Left Ventricle: Left ventricular ejection fraction, by estimation, is 70 to 75%. The left ventricle has hyperdynamic function. The left  ventricle has no regional wall motion abnormalities. Definity contrast agent was given IV to delineate the left ventricular endocardial borders. The left ventricular internal cavity size was normal in size. Suboptimal image quality limits for assessment of left ventricular hypertrophy. Left ventricular diastolic parameters are indeterminate. Right Ventricle: The right ventricular size is severely enlarged. Right vetricular wall thickness was not well visualized. Right ventricular systolic function is hyperdynamic. Left Atrium: Left atrial size was not well visualized. Right Atrium: Right atrial size was not well visualized. Pericardium: There is no evidence of pericardial effusion. Mitral Valve: The mitral valve was not well visualized. No evidence of mitral valve regurgitation. No evidence of mitral valve stenosis. Tricuspid Valve: The tricuspid valve is normal in structure. Tricuspid valve regurgitation is not demonstrated. No evidence of tricuspid stenosis. Aortic Valve: The aortic valve was not well visualized. Aortic valve regurgitation is not visualized. Aortic valve mean gradient measures 5.0 mmHg. Aortic valve peak gradient measures 8.3 mmHg. Aortic valve area, by VTI measures 2.61 cm. Pulmonic Valve: The pulmonic valve was not well visualized. Pulmonic valve regurgitation is not visualized. No evidence of pulmonic stenosis. Aorta: The aortic root is normal in size and structure. Venous: The inferior vena cava is normal in size with greater than 50% respiratory variability, suggesting right atrial pressure of 3 mmHg. IAS/Shunts: The interatrial septum was not well visualized.  LEFT VENTRICLE PLAX 2D LVIDd:         4.00 cm LVIDs:         2.50 cm LV PW:         1.10 cm LV IVS:        1.20 cm LVOT diam:     1.90 cm LV SV:         48 LV SV Index:   19 LVOT Area:     2.84 cm  RIGHT VENTRICLE RV S prime:     14.40 cm/s TAPSE (M-mode): 2.1 cm LEFT ATRIUM             Index        RIGHT ATRIUM           Index LA  diam:        3.50 cm 1.40 cm/m   RA Area:     17.50 cm LA Vol (A2C):   35.7 ml 14.25 ml/m  RA  Volume:   47.80 ml  19.09 ml/m LA Vol (A4C):   37.9 ml 15.13 ml/m LA Biplane Vol: 39.7 ml 15.85 ml/m  AORTIC VALVE AV Area (Vmax):    2.64 cm AV Area (Vmean):   2.46 cm AV Area (VTI):     2.61 cm AV Vmax:           144.00 cm/s AV Vmean:          97.900 cm/s AV VTI:            0.185 m AV Peak Grad:      8.3 mmHg AV Mean Grad:      5.0 mmHg LVOT Vmax:         134.00 cm/s LVOT Vmean:        84.900 cm/s LVOT VTI:          0.170 m LVOT/AV VTI ratio: 0.92  SHUNTS Systemic VTI:  0.17 m Systemic Diam: 1.90 cm Riley Lam MD Electronically signed by Riley Lam MD Signature Date/Time: 03/24/2023/11:49:21 AM    Final       Signature  -   Susa Raring M.D on 03/28/2023 at 9:36 AM   -  To page go to [email or web address]

## 2023-03-28 NOTE — Progress Notes (Signed)
Patient ID: Taylor Hughes, male   DOB: January 14, 1971, 52 y.o.   MRN: [id number] Patient is status post transmetatarsal amputation.  Cultures are pending.  There is 150 cc in the wound VAC canister.  Patient will need discharge on oral antibiotics based on culture sensitivities.  Ideally 3 weeks of oral antibiotics.

## 2023-03-29 ENCOUNTER — Other Ambulatory Visit: Payer: Self-pay

## 2023-03-29 DIAGNOSIS — I517 Cardiomegaly: Secondary | ICD-10-CM | POA: Diagnosis not present

## 2023-03-29 DIAGNOSIS — E11628 Type 2 diabetes mellitus with other skin complications: Secondary | ICD-10-CM | POA: Diagnosis not present

## 2023-03-29 DIAGNOSIS — M86271 Subacute osteomyelitis, right ankle and foot: Secondary | ICD-10-CM | POA: Diagnosis not present

## 2023-03-29 DIAGNOSIS — I890 Lymphedema, not elsewhere classified: Secondary | ICD-10-CM | POA: Diagnosis not present

## 2023-03-29 LAB — BASIC METABOLIC PANEL
Anion gap: 8 (ref 5–15)
BUN: 12 mg/dL (ref 6–20)
CO2: 23 mmol/L (ref 22–32)
Calcium: 7.9 mg/dL — ABNORMAL LOW (ref 8.9–10.3)
Chloride: 105 mmol/L (ref 98–111)
Creatinine, Ser: 1.13 mg/dL (ref 0.61–1.24)
GFR, Estimated: >60 mL/min (ref >60)
Glucose, Bld: 186 mg/dL — ABNORMAL HIGH (ref 70–99)
Potassium: 3.4 mmol/L — ABNORMAL LOW (ref 3.5–5.1)
Sodium: 136 mmol/L (ref 135–145)

## 2023-03-29 LAB — CBC WITH DIFFERENTIAL/PLATELET
Abs Immature Granulocytes: 0.08 10*3/uL — ABNORMAL HIGH (ref 0.00–0.07)
Basophils Absolute: 0 10*3/uL (ref 0.0–0.1)
Basophils Relative: 0 %
Eosinophils Absolute: 0.3 10*3/uL (ref 0.0–0.5)
Eosinophils Relative: 3 %
HCT: 21.8 % — ABNORMAL LOW (ref 39.0–52.0)
Hemoglobin: 7 g/dL — ABNORMAL LOW (ref 13.0–17.0)
Immature Granulocytes: 1 %
Lymphocytes Relative: 17 %
Lymphs Abs: 1.5 10*3/uL (ref 0.7–4.0)
MCH: 27.8 pg (ref 26.0–34.0)
MCHC: 32.1 g/dL (ref 30.0–36.0)
MCV: 86.5 fL (ref 80.0–100.0)
Monocytes Absolute: 0.7 10*3/uL (ref 0.1–1.0)
Monocytes Relative: 8 %
Neutro Abs: 6.3 10*3/uL (ref 1.7–7.7)
Neutrophils Relative %: 71 %
Platelets: 510 10*3/uL — ABNORMAL HIGH (ref 150–400)
RBC: 2.52 MIL/uL — ABNORMAL LOW (ref 4.22–5.81)
RDW: 15.1 % (ref 11.5–15.5)
WBC: 8.9 10*3/uL (ref 4.0–10.5)
nRBC: 0 % (ref 0.0–0.2)

## 2023-03-29 LAB — BRAIN NATRIURETIC PEPTIDE: B Natriuretic Peptide: 246.2 pg/mL — ABNORMAL HIGH (ref 0.0–100.0)

## 2023-03-29 LAB — PROCALCITONIN: Procalcitonin: 0.13 ng/mL

## 2023-03-29 LAB — GLUCOSE, CAPILLARY
Glucose-Capillary: 162 mg/dL — ABNORMAL HIGH (ref 70–99)
Glucose-Capillary: 130 mg/dL — ABNORMAL HIGH (ref 70–99)
Glucose-Capillary: 112 mg/dL — ABNORMAL HIGH (ref 70–99)
Glucose-Capillary: 165 mg/dL — ABNORMAL HIGH (ref 70–99)

## 2023-03-29 LAB — PREPARE RBC (CROSSMATCH)

## 2023-03-29 LAB — C-REACTIVE PROTEIN: CRP: 9.7 mg/dL — ABNORMAL HIGH (ref <1.0)

## 2023-03-29 LAB — MAGNESIUM: Magnesium: 1.8 mg/dL (ref 1.7–2.4)

## 2023-03-29 MED ORDER — SODIUM CHLORIDE 0.9% IV SOLUTION: Freq: Once | INTRAVENOUS | Status: AC

## 2023-03-29 MED ORDER — POTASSIUM CHLORIDE CRYS ER 20 MEQ PO TBCR
40.0000 meq | EXTENDED_RELEASE_TABLET | Freq: Once | ORAL | Status: AC
  Administered 2023-03-29: 40 meq via ORAL
  Filled 2023-03-29: qty 2

## 2023-03-29 NOTE — Progress Notes (Signed)
Physical Therapy Treatment Patient Details Name: Taylor Hughes MRN: [id number] DOB: 04/14/1971 Today's Date: 03/29/2023   History of Present Illness 52 year old male presents 03/23/23 with infection of his right lower extremity. 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    PT Comments  Patient progressing very well and able to do stair training today. Pt does have to put weight through his rt heel for ambulation (short distance) and for up/down curb. Min cues for sequencing with gait and stairs to minimize weight through distal portion of foot.     If plan is discharge home, recommend the following: Assistance with cooking/housework;Help with stairs or ramp for entrance   Can travel by private vehicle        Equipment Recommendations  Rolling walker (2 wheels)    Recommendations for Other Services       Precautions / Restrictions Precautions Precautions: Fall Precaution Comments: R post-op shoe Restrictions Weight Bearing Restrictions: Yes RLE Weight Bearing: Non weight bearing Other Position/Activity Restrictions: MD prefers NWB but able to put weight on it for ambulation with post op shoe per ortho note     Mobility  Bed Mobility Overal bed mobility: Modified Independent Bed Mobility: Supine to Sit, Sit to Supine     Supine to sit: Modified independent (Device/Increase time), HOB elevated Sit to supine: Modified independent (Device/Increase time), HOB elevated        Transfers Overall transfer level: Needs assistance Equipment used: Rolling walker (2 wheels) Transfers: Sit to/from Stand Sit to Stand: Contact guard assist           General transfer comment: uses momentum to come to stand, no assist needed    Ambulation/Gait Ambulation/Gait assistance: Contact guard assist Gait Distance (Feet): 12 Feet (toileted; 12) Assistive device: Rolling walker (2 wheels) Gait  Pattern/deviations: Step-to pattern       General Gait Details: vc for sequencing with RW and Rt foot leading with weight through his heel in post-op shoe   Stairs Stairs: Yes Stairs assistance: Contact guard assist Stair Management: No rails, Step to pattern, Forwards, With walker Number of Stairs: 1 General stair comments: pt has curb to step up to get to his apartment; instructed to lead up with his rt foot to incr ability to keep weight through his heel   Wheelchair Mobility     Tilt Bed    Modified Rankin (Stroke Patients Only)       Balance Overall balance assessment: Needs assistance Sitting-balance support: No upper extremity supported, Feet supported Sitting balance-Leahy Scale: Fair Sitting balance - Comments: EOB   Standing balance support: During functional activity, No upper extremity supported Standing balance-Leahy Scale: Good Standing balance comment: Standing to do pericare without UE support                            Cognition Arousal: Alert Behavior During Therapy: WFL for tasks assessed/performed, Anxious Overall Cognitive Status: Within Functional Limits for tasks assessed                                          Exercises      General Comments General comments (skin integrity, edema, etc.): VSS on RA      Pertinent Vitals/Pain Pain Assessment Pain Assessment: Faces Faces Pain Scale: Hurts a little bit Pain Location: R  foot Pain Descriptors / Indicators: Aching, Operative site guarding Pain Intervention(s): Limited activity within patient's tolerance    Home Living                          Prior Function            PT Goals (current goals can now be found in the care plan section) Acute Rehab PT Goals Patient Stated Goal: be able to walk to the bathroom on his own PT Goal Formulation: With patient Time For Goal Achievement: 04/11/23 Potential to Achieve Goals: Good Progress towards PT  goals: Progressing toward goals    Frequency    Min 1X/week      PT Plan      Co-evaluation              AM-PAC PT "6 Clicks" Mobility   Outcome Measure  Help needed turning from your back to your side while in a flat bed without using bedrails?: None Help needed moving from lying on your back to sitting on the side of a flat bed without using bedrails?: None Help needed moving to and from a bed to a chair (including a wheelchair)?: A Little Help needed standing up from a chair using your arms (e.g., wheelchair or bedside chair)?: A Little Help needed to walk in hospital room?: A Little Help needed climbing 3-5 steps with a railing? : A Little 6 Click Score: 20    End of Session   Activity Tolerance: Patient tolerated treatment well Patient left: in bed;with call bell/phone within reach;with bed alarm set Nurse Communication: Mobility status PT Visit Diagnosis: Difficulty in walking, not elsewhere classified (R26.2);Pain Pain - Right/Left: Right Pain - part of body: Ankle and joints of foot     Time: 9147-8295 PT Time Calculation (min) (ACUTE ONLY): 32 min  Charges:    $Gait Training: 23-37 mins PT General Charges $$ ACUTE PT VISIT: 1 Visit                      Jerolyn Center, PT Acute Rehabilitation Services  Office [phone]    Zena Amos 03/29/2023, 3:25 PM

## 2023-03-29 NOTE — Progress Notes (Addendum)
Pharmacy Antibiotic Note  Taylor Hughes is a 52 y.o. male admitted on 03/22/2023 with R-foot osteo.  Pharmacy has been consulted for Daptomycin + Rocephin + Flagyl dosing.  Noted patient underwent transmetatarsal amputation 8/14 however abscess and necrotic tissue noted to extend into the midfoot. ID has been consulted for recommendations. Intra-op culture data showing GPC in pairs on gram stain, culture still pending.   Plan: - Continue Daptomycin 750 mg IV (8 mg/kg AdjBW) every 24 hours - Continue weekly CK check - Continue Rocephin and Flagyl per MD - Will continue to follow renal function, culture results, LOT, and antibiotic de-escalation plans   Height: 5\' 11"  (180.3 cm) Weight: (!) 146.6 kg (323 lb 3.1 oz) IBW/kg (Calculated) : 75.3  Temp (24hrs), Avg:98.1 F (36.7 C), Min:97.9 F (36.6 C), Max:98.2 F (36.8 C)  Recent Labs  Lab 03/22/23 2151 03/23/23 0005 03/23/23 0227 03/24/23 0316 03/25/23 0109 03/26/23 0326 03/27/23 0658 03/28/23 0207 03/29/23 0222  WBC  --  19.8* 19.1*   < > 15.5* 13.2* 9.1 8.5 8.9  CREATININE  --   --  1.70*   < > 1.27* 1.46* 1.35* 1.10 1.13  LATICACIDVEN 1.3 1.3 1.2  --   --   --   --   --   --    < > = values in this interval not displayed.    Estimated Creatinine Clearance: 113.5 mL/min (by C-G formula based on SCr of 1.13 mg/dL).    No Known Allergies  Antimicrobials this admission: Vanc 8/11 >> 8/15 Cefepime 8/11 >> 8/15 Flagyl 8/12 >> Rocephin 8/15 >> Dapto 8/15 >>  Dose adjustments this admission: N/a  Microbiology results: 8/11 BCx >> ngtdF 8/12 COVID >> neg 8/14 MRSA PCR > neg 8/14 R-TMA tissue cx >> GPC in pairs (still pending)  Thank you for allowing pharmacy to be a part of this patient's care.  Gwynn Burly, PharmD, BCPS, BCIDP 03/29/2023 12:41 PM

## 2023-03-29 NOTE — Plan of Care (Signed)

## 2023-03-29 NOTE — Plan of Care (Signed)
  Problem: Coping: Goal: Ability to adjust to condition or change in health will improve Outcome: Progressing   Problem: Metabolic: Goal: Ability to maintain appropriate glucose levels will improve Outcome: Progressing   Problem: Nutritional: Goal: Maintenance of adequate nutrition will improve Outcome: Progressing   Problem: Skin Integrity: Goal: Risk for impaired skin integrity will decrease Outcome: Progressing   

## 2023-03-29 NOTE — Progress Notes (Signed)
PROGRESS NOTE                                                                                                                                                                                                             Patient Demographics:    Taylor Hughes, is a 52 y.o. male, DOB - 11-06-1970, VWU:[id number]  Outpatient Primary MD for the patient is Hoy Register, MD    LOS - 6  Admit date - 03/22/2023    Chief Complaint  Patient presents with   Wound Infection   Hyperglycemia       Brief Narrative (HPI from H&P)   52 year old male with past medical history of T2DM, peripheral neuropathy, atrial fibrillation, HTN, and GERD. He presents with infection of his right lower extremity . Admitted with diabetic foot infection/osteo.  He was seen by orthopedics and cardiology, transferred to my service on 03/22/2023.   Subjective:   Patient in bed, appears comfortable, denies any headache, no fever, no chest pain or pressure, no shortness of breath , no abdominal pain. No new focal weakness.   Assessment  & Plan :   Sepsis due to right diabetic foot ulcer present on admission.  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   Intermittent SVT and sinus tachycardia.  Due to #1 above.  Echocardiogram noted with preserved left sided function, stable TSH, CTA negative for PE, after amputation and supportive care for infection tachycardia much improved.  Right  lower extremity edema.  Present for the last month secondary to the infection, lower extremity venous duplex although limited but negative.  Essential hypertension.  Pressure on the softer side, improved after IV fluids and midodrine, now tolerating beta-blocker, start tapering off midodrine and monitor.  AKI.  Due to sepsis.  Hydrate and monitor hold nephrotoxins, stable renal ultrasound, improving, stop IV fluids and monitor.  Avoid nephrotoxins.  Anemia.  Combination of heme dilution, sepsis and iron deficiency on anemia panel, placed on oral  iron and folic acid, type screen done, giving 1 unit of packed RBC on 03/29/2023, no signs of ongoing active brisk bleeding.  Dyslipidemia.  On statin.  Vomiting due to gastroparesis.  Much improved with supportive care.  Morbid obesity.  BMI 41.  Follow-up with PCP for weight loss.  DM type II.  On Lantus and SSI.  Was adjusted further on 03/27/2023 for better control.  Getting diabetic and insulin education.  Lab Results  Component Value Date   HGBA1C 9.8 (H) 03/23/2023   CBG (last 3)  Recent Labs    03/28/23 1129 03/28/23 1635 03/28/23 2039  GLUCAP 142* 110* 182*         Condition - Fair  Family Communication  :  None  Code Status :  Full  Consults  :  Cards, Ortho, ID  PUD Prophylaxis : PPI   Procedures  :     Right arm PICC line.    Right foot transmetatarsal amputation by Dr. [name] on 03/25/2023.  Echocardiogram.  1. Left ventricular ejection fraction, by estimation, is 70 to 75%. The left ventricle has hyperdynamic function. The left ventricle has no regional wall motion abnormalities. Left ventricular diastolic parameters are indeterminate.  2. Right ventricular systolic function is hyperdynamic. The right ventricular size is severely enlarged.  3. The mitral valve was not well visualized. No evidence of mitral valve regurgitation. No evidence of mitral stenosis.  4. The aortic valve was not well visualized. Aortic valve  regurgitation is not visualized.  5. The inferior vena cava is normal in size with greater than 50% respiratory variability, suggesting right atrial pressure of 3 mmHg.    CTA chest. 1. No CT evidence of pulmonary embolism. 2. Small bilateral pleural effusions with minimal compressive atelectasis of the lung bases. Pneumonia is not excluded.  Renal ultrasound.  Nonacute.    Lower extremity venous duplex.  No DVT.    ABI - Right: Resting right ankle-brachial index indicates mild right lower extremity arterial disease. Left: Resting left ankle-brachial index is within normal range. *See table(s) above for measurements and observations.      Disposition Plan  :    Status is: Inpatient   DVT Prophylaxis  :    SCD's Start: 03/25/23 1501 heparin injection 5,000 Units Start: 03/23/23 1800    Lab Results  Component Value Date   PLT 510 (H) 03/29/2023    Diet :  Diet Order             Diet Carb Modified Fluid consistency: Thin; Room service appropriate? Yes  Diet effective now                    Inpatient Medications  Scheduled Meds:  sodium chloride   Intravenous Once   vitamin C  1,000 mg Oral Daily   aspirin EC  81 mg Oral Daily   atorvastatin  20 mg Oral Daily   buPROPion  150 mg Oral Daily   docusate sodium  200 mg Oral BID   ferrous sulfate  325 mg Oral BID WC   folic acid  1 mg Oral Daily   gabapentin  300 mg Oral BID   heparin injection (subcutaneous)  5,000 Units Subcutaneous Q8H   insulin aspart  0-15 Units Subcutaneous TID WC   insulin aspart  0-5 Units Subcutaneous QHS   insulin glargine-yfgn  22 Units Subcutaneous BID   metoCLOPramide (REGLAN) injection  10 mg Intravenous Q6H   metoprolol tartrate  25 mg Oral BID  metroNIDAZOLE  500 mg Oral Q12H   midodrine  5 mg Oral BID WC   mupirocin ointment  1 Application Nasal BID   nutrition supplement (JUVEN)  1 packet Oral BID BM   pantoprazole  40 mg Oral Daily   zinc sulfate  220 mg Oral Daily    Continuous Infusions:  sodium chloride Stopped (03/28/23 1533)   cefTRIAXone (ROCEPHIN)  IV Stopped (03/28/23 1150)   DAPTOmycin (CUBICIN) 750 mg in sodium chloride 0.9 % IVPB Stopped (03/28/23 1416)   magnesium sulfate bolus IVPB     PRN Meds:.sodium chloride, acetaminophen **OR** acetaminophen, acetaminophen, alum & mag hydroxide-simeth, bisacodyl, guaiFENesin-dextromethorphan, hydrALAZINE, labetalol, magnesium citrate, magnesium sulfate bolus IVPB, metoprolol tartrate, metoprolol tartrate, morphine injection, ondansetron, oxyCODONE, phenol, polyethylene glycol, senna-docusate, simethicone    Objective:   Vitals:   03/28/23 1537 03/28/23 2040 03/28/23 2355 03/29/23 0359  BP:  124/62 128/73 (!) 141/76  Pulse:  85 83 78  Resp:  (!) 22 19 20   Temp:  98.2 F (36.8 C) 98.1 F (36.7 C) 97.9 F (36.6 C)  TempSrc:  Oral Oral Oral  SpO2: 98% 97% 94% 96%  Weight:      Height:        Wt Readings from Last 3 Encounters:  03/28/23 (!) 146.6 kg  01/19/23 135.9 kg  09/08/22 133.1 kg     Intake/Output Summary (Last 24 hours) at 03/29/2023 0856 Last data filed at 03/28/2023 2100 Gross per 24 hour  Intake 420.98 ml  Output --  Net 420.98 ml     Physical Exam  Awake Alert, No new F.N deficits, Normal affect [place].AT,PERRAL Supple Neck, No JVD,   Symmetrical Chest wall movement, Good air movement bilaterally, CTAB RRR,No Gallops,Rubs or new Murmurs,  +ve B.Sounds, Abd Soft, No tenderness,   R TMA under bandage - W.Vac, improving right lower extremity edema and swelling,      Data Review:    Recent Labs  Lab 03/24/23 0316 03/25/23 0109 03/26/23 0326 03/27/23 0658 03/28/23 0207 03/29/23 0222  WBC 17.1* 15.5* 13.2* 9.1 8.5 8.9  HGB 8.5* 7.7* 7.8* 7.5* 7.6* 7.0*  HCT 26.0* 23.6* 24.5* 23.3* 23.8* 21.8*  PLT 465* 461* 470* 470* 507* 510*  MCV 84.1 83.4 83.9 86.6 86.2 86.5  MCH 27.5 27.2 26.7 27.9 27.5 27.8  MCHC 32.7 32.6 31.8 32.2 31.9 32.1  RDW 14.6 14.6 15.0 15.2  15.3 15.1  LYMPHSABS 1.4  --  1.7 1.2 1.4 1.5  MONOABS 1.3*  --  1.0 0.8 0.7 0.7  EOSABS 0.0  --  0.2 0.3 0.3 0.3  BASOSABS 0.0  --  0.0 0.0 0.0 0.0    Recent Labs  Lab 03/22/23 2023 03/22/23 2023 03/22/23 2151 03/23/23 0005 03/23/23 0227 03/23/23 1859 03/24/23 0316 03/25/23 0109 03/25/23 0819 03/26/23 0326 03/27/23 0658 03/28/23 0207 03/29/23 0222  NA 130*  --   --   --  132*  --  137 134*  --  136 135 135 136  K 5.0  --   --   --  4.3  --  3.6 3.8  --  3.9 3.8 3.7 3.4*  CL 99  --   --   --  102  --  106 102  --  103 103 102 105  CO2 11*  --   --   --  12*  --  22 24  --  22 23 22 23   ANIONGAP 20*  --   --   --  18*  --  9 8  --  11 9 11 8   GLUCOSE 405*  --   --   --  410*  --  126* 205*  --  210* 275* 190* 186*  BUN 26*  --   --   --  28*  --  21* 19  --  22* 20 16 12   CREATININE 1.52*  --   --   --  1.70*  --  1.41* 1.27*  --  1.46* 1.35* 1.10 1.13  AST 14*  --   --   --  11*  --  14*  --   --   --   --   --   --   ALT 15  --   --   --  13  --  14  --   --   --   --   --   --   ALKPHOS 64  --   --   --  56  --  50  --   --   --   --   --   --   BILITOT 1.2  --   --   --  1.1  --  0.4  --   --   --   --   --   --   ALBUMIN 2.1*  --   --   --  1.8*  --  1.6*  --   --   --   --   --   --   CRP  --    < > 30.8*  --   --   --   --   --  27.2* 25.5* 19.2* 15.7* 9.7*  PROCALCITON  --   --   --   --   --   --   --   --  0.62 0.60 0.36 0.28 0.13  LATICACIDVEN  --   --  1.3 1.3 1.2  --   --   --   --   --   --   --   --   INR  --   --   --   --   --   --   --   --  1.2  --   --   --   --   TSH  --   --   --   --   --   --  1.058  --   --   --   --   --   --   HGBA1C  --   --   --  9.8*  --   --   --   --   --   --   --   --   --   BNP  --   --   --   --   --    < >  --   --  524.5* 218.5* 320.4* 314.3* 246.2*  MG  --    < >  --   --  1.9  --  1.7  --   --  1.9 2.1 1.9 1.8  CALCIUM 8.7*  --   --   --  8.7*  --  8.2* 7.8*  --  8.1* 8.1* 8.1* 7.9*   < > = values in this interval not  displayed.     Recent Labs  Lab 03/22/23 2023 03/22/23 2151 03/23/23 0005 03/23/23 4401 03/23/23 1859 03/24/23 0272 03/25/23 0109 03/25/23 5366 03/26/23 0326 03/27/23 4403 03/28/23  0207 03/29/23 0222  CRP  --  30.8*  --   --   --   --   --  27.2* 25.5* 19.2* 15.7* 9.7*  PROCALCITON  --   --   --   --   --   --   --  0.62 0.60 0.36 0.28 0.13  LATICACIDVEN  --  1.3 1.3 1.2  --   --   --   --   --   --   --   --   INR  --   --   --   --   --   --   --  1.2  --   --   --   --   TSH  --   --   --   --   --  1.058  --   --   --   --   --   --   HGBA1C  --   --  9.8*  --   --   --   --   --   --   --   --   --   BNP  --   --   --   --    < >  --   --  524.5* 218.5* 320.4* 314.3* 246.2*  MG   < >  --   --  1.9  --  1.7  --   --  1.9 2.1 1.9 1.8  CALCIUM  --   --   --  8.7*  --  8.2* 7.8*  --  8.1* 8.1* 8.1* 7.9*   < > = values in this interval not displayed.    Micro Results Recent Results (from the past 240 hour(s))  Blood culture (routine x 2)     Status: None   Collection Time: 03/22/23  8:23 PM   Specimen: BLOOD RIGHT HAND  Result Value Ref Range Status   Specimen Description BLOOD RIGHT HAND  Final   Special Requests   Final    BOTTLES DRAWN AEROBIC AND ANAEROBIC Blood Culture adequate volume   Culture   Final    NO GROWTH 5 DAYS Performed at [facility], [address], [place], [place] [address]    Report Status 03/27/2023 FINAL  Final  Blood culture (routine x 2)     Status: None   Collection Time: 03/22/23  9:09 PM   Specimen: BLOOD  Result Value Ref Range Status   Specimen Description BLOOD SITE NOT SPECIFIED  Final   Special Requests   Final    BOTTLES DRAWN AEROBIC ONLY Blood Culture adequate volume   Culture   Final    NO GROWTH 5 DAYS Performed at [facility], [address] [place], [place] [address]    Report Status 03/27/2023 FINAL  Final  SARS Coronavirus 2 by RT PCR (hospital order, performed in [facility] hospital lab) *cepheid single  result test* Anterior Nasal Swab     Status: None   Collection Time: 03/23/23  5:20 PM   Specimen: Anterior Nasal Swab  Result Value Ref Range Status   SARS Coronavirus 2 by RT PCR NEGATIVE NEGATIVE Final    Comment: Performed at [facility], [address] [place], [place] [address]  Surgical PCR screen     Status: Abnormal   Collection Time: 03/24/23  5:30 PM   Specimen: Nasal Mucosa; Nasal Swab  Result Value Ref Range Status   MRSA, PCR  NEGATIVE NEGATIVE Final   Staphylococcus aureus POSITIVE (A) NEGATIVE Final    Comment: (NOTE) The Xpert SA Assay (FDA approved for NASAL specimens in patients 60 years of age and older), is one component of a comprehensive surveillance program. It is not intended to diagnose infection nor to guide or monitor treatment. Performed at [facility], [address], [place], [place] [address]   MRSA Next Gen by PCR, Nasal     Status: None   Collection Time: 03/25/23  6:40 AM   Specimen: Nasal Mucosa; Nasal Swab  Result Value Ref Range Status   MRSA by PCR Next Gen NOT DETECTED NOT DETECTED Final    Comment: (NOTE) The GeneXpert MRSA Assay (FDA approved for NASAL specimens only), is one component of a comprehensive MRSA colonization surveillance program. It is not intended to diagnose MRSA infection nor to guide or monitor treatment for MRSA infections. Test performance is not FDA approved in patients less than 61 years old. Performed at [facility], [address], [place], [place] [address]   Aerobic/Anaerobic Culture w Gram Stain (surgical/deep wound)     Status: None (Preliminary result)   Collection Time: 03/25/23 12:30 PM   Specimen: Soft Tissue, Other  Result Value Ref Range Status   Specimen Description TISSUE RIGHT TRANSMETATARSAL  Final   Special Requests A  Final   Gram Stain NO WBC SEEN RARE GRAM POSITIVE COCCI IN PAIRS   Final   Culture   Final    FEW BACTEROIDES FRAGILIS BETA LACTAMASE POSITIVE CULTURE  REINCUBATED FOR BETTER GROWTH Performed at [facility], [address] [place], [place] [address]    Report Status PENDING  Incomplete    Radiology Reports [place] EKG SITE RITE  Result Date: 03/29/2023 If Site Rite image not attached, placement could not be confirmed due to current cardiac rhythm.     [facility]  -   Susa Raring M.D on 03/29/2023 at 8:56 AM   -  To page go to [email or web address]

## 2023-03-30 DIAGNOSIS — R000 Tachycardia, unspecified: Secondary | ICD-10-CM | POA: Diagnosis not present

## 2023-03-30 DIAGNOSIS — L089 Local infection of the skin and subcutaneous tissue, unspecified: Secondary | ICD-10-CM | POA: Diagnosis not present

## 2023-03-30 DIAGNOSIS — E11628 Type 2 diabetes mellitus with other skin complications: Secondary | ICD-10-CM | POA: Diagnosis not present

## 2023-03-30 LAB — CBC WITH DIFFERENTIAL/PLATELET
Abs Immature Granulocytes: 0.09 10*3/uL — ABNORMAL HIGH (ref 0.00–0.07)
Basophils Absolute: 0 10*3/uL (ref 0.0–0.1)
Basophils Relative: 0 %
Eosinophils Absolute: 0.3 10*3/uL (ref 0.0–0.5)
Eosinophils Relative: 3 %
HCT: 26.5 % — ABNORMAL LOW (ref 39.0–52.0)
Hemoglobin: 8.3 g/dL — ABNORMAL LOW (ref 13.0–17.0)
Immature Granulocytes: 1 %
Lymphocytes Relative: 19 %
Lymphs Abs: 1.8 10*3/uL (ref 0.7–4.0)
MCH: 26.6 pg (ref 26.0–34.0)
MCHC: 31.3 g/dL (ref 30.0–36.0)
MCV: 84.9 fL (ref 80.0–100.0)
Monocytes Absolute: 0.8 10*3/uL (ref 0.1–1.0)
Monocytes Relative: 9 %
Neutro Abs: 6.5 10*3/uL (ref 1.7–7.7)
Neutrophils Relative %: 68 %
Platelets: 538 10*3/uL — ABNORMAL HIGH (ref 150–400)
RBC: 3.12 MIL/uL — ABNORMAL LOW (ref 4.22–5.81)
RDW: 16.3 % — ABNORMAL HIGH (ref 11.5–15.5)
WBC: 9.6 10*3/uL (ref 4.0–10.5)
nRBC: 0 % (ref 0.0–0.2)

## 2023-03-30 LAB — BASIC METABOLIC PANEL
Anion gap: 6 (ref 5–15)
BUN: 10 mg/dL (ref 6–20)
CO2: 22 mmol/L (ref 22–32)
Calcium: 8.2 mg/dL — ABNORMAL LOW (ref 8.9–10.3)
Chloride: 109 mmol/L (ref 98–111)
Creatinine, Ser: 1.07 mg/dL (ref 0.61–1.24)
GFR, Estimated: >60 mL/min (ref >60)
Glucose, Bld: 140 mg/dL — ABNORMAL HIGH (ref 70–99)
Potassium: 3.9 mmol/L (ref 3.5–5.1)
Sodium: 137 mmol/L (ref 135–145)

## 2023-03-30 LAB — TYPE AND SCREEN
ABO/RH(D): O POS
Antibody Screen: NEGATIVE
Unit division: 0

## 2023-03-30 LAB — GLUCOSE, CAPILLARY
Glucose-Capillary: 107 mg/dL — ABNORMAL HIGH (ref 70–99)
Glucose-Capillary: 128 mg/dL — ABNORMAL HIGH (ref 70–99)
Glucose-Capillary: 162 mg/dL — ABNORMAL HIGH (ref 70–99)
Glucose-Capillary: 187 mg/dL — ABNORMAL HIGH (ref 70–99)

## 2023-03-30 LAB — BPAM RBC
Blood Product Expiration Date: 202408272359
ISSUE DATE / TIME: 202408181030
Unit Type and Rh: 5100

## 2023-03-30 LAB — PROCALCITONIN: Procalcitonin: <0.1 ng/mL

## 2023-03-30 MED ORDER — SODIUM CHLORIDE 0.9% FLUSH: 10.0000 mL | INTRAVENOUS | Status: DC | PRN

## 2023-03-30 MED ORDER — METOCLOPRAMIDE HCL 10 MG PO TABS
10.0000 mg | ORAL_TABLET | Freq: Three times a day (TID) | ORAL | Status: DC
  Administered 2023-03-30 – 2023-04-06 (×25): 10 mg via ORAL
  Filled 2023-03-30 (×27): qty 1

## 2023-03-30 MED ORDER — SODIUM CHLORIDE 0.9 % IV SOLN
3.0000 g | Freq: Four times a day (QID) | INTRAVENOUS | Status: DC
  Administered 2023-03-31 – 2023-04-07 (×30): 3 g via INTRAVENOUS
  Filled 2023-03-30 (×29): qty 8

## 2023-03-30 MED ORDER — CHLORHEXIDINE GLUCONATE CLOTH 2 % EX PADS
6.0000 | MEDICATED_PAD | Freq: Every day | CUTANEOUS | Status: DC
  Administered 2023-03-30 – 2023-04-07 (×11): 6 via TOPICAL

## 2023-03-30 NOTE — Progress Notes (Signed)
Peripherally Inserted Central Catheter Placement  The IV Nurse has discussed with the patient and/or persons authorized to consent for the patient, the purpose of this procedure and the potential benefits and risks involved with this procedure.  The benefits include less needle sticks, lab draws from the catheter, and the patient may be discharged home with the catheter. Risks include, but not limited to, infection, bleeding, blood clot (thrombus formation), and puncture of an artery; nerve damage and irregular heartbeat and possibility to perform a PICC exchange if needed/ordered by physician.  Alternatives to this procedure were also discussed.  Bard Power PICC patient education guide, fact sheet on infection prevention and patient information card has been provided to patient /or left at bedside.    PICC Placement Documentation  PICC Single Lumen 03/30/23 Right Brachial 40 cm 0 cm (Active)  Indication for Insertion or Continuance of Line Home intravenous therapies (PICC only) 03/30/23 1735  Exposed Catheter (cm) 0 cm 03/30/23 1735  Site Assessment Clean, Dry, Intact 03/30/23 1735  Line Status Flushed;Saline locked;Blood return noted 03/30/23 1735  Dressing Type Transparent;Securing device 03/30/23 1735  Dressing Status Antimicrobial disc in place;Clean, Dry, Intact 03/30/23 1735  Line Adjustment (NICU/IV Team Only) No 03/30/23 1735  Dressing Intervention New dressing;Other (Comment) 03/30/23 1735  Dressing Change Due 04/06/23 03/30/23 1735       Reginia Forts Albarece 03/30/2023, 5:36 PM

## 2023-03-30 NOTE — Plan of Care (Signed)

## 2023-03-30 NOTE — Progress Notes (Signed)
PROGRESS NOTE                                                                                                                                                                                                             Patient Demographics:    Taylor Hughes, is a 52 y.o. male, DOB - 01/26/1971, UVO:[id number]  Outpatient Primary MD for the patient is Hoy Register, MD    LOS - 7  Admit date - 03/22/2023    Chief Complaint  Patient presents with   Wound Infection   Hyperglycemia       Brief Narrative (HPI from H&P)   52 year old male with past medical history of T2DM, peripheral neuropathy, atrial fibrillation, HTN, and GERD. He presents with infection of his right lower extremity . Admitted with diabetic foot infection/osteo.  He was seen by orthopedics and cardiology, transferred to my service on 03/22/2023.   Subjective:   Patient in bed, appears comfortable, denies any headache, no fever, no chest pain or pressure, no shortness of breath , no abdominal pain. No new focal weakness.    Assessment  & Plan :   Sepsis due to right diabetic foot ulcer present on admission.  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   Intermittent SVT and sinus tachycardia.  Due to #1 above.  Echocardiogram noted with preserved left sided function, stable TSH, CTA negative for PE, after amputation and supportive care for infection tachycardia much improved.  Right  lower extremity edema.  Present for the last month secondary to the infection, lower extremity venous duplex although limited but negative.  Essential hypertension.  Pressure on the softer side, improved after IV fluids and midodrine, now tolerating beta-blocker, start tapering off midodrine and monitor.  AKI.  Due to sepsis.  Hydrate and monitor hold nephrotoxins, stable renal ultrasound, improving, stop IV fluids and monitor.  Avoid nephrotoxins.  Anemia.  Combination of heme dilution, sepsis and iron deficiency on anemia panel, placed on  oral iron and folic acid, type screen done, giving 1 unit of packed RBC on 03/29/2023, no signs of ongoing active brisk bleeding.  Dyslipidemia.  On statin.  Vomiting due to gastroparesis.  Much improved with supportive care.  Morbid obesity.  BMI 41.  Follow-up with PCP for weight loss.  DM type II.  On Lantus and SSI.  Was adjusted further on 03/27/2023 for better control.  Getting diabetic and insulin education.  Lab Results  Component Value Date   HGBA1C 9.8 (H) 03/23/2023   CBG (last 3)  Recent Labs    03/29/23 1811 03/29/23 2130 03/30/23 0733  GLUCAP 112* 165* 107*         Condition - Fair  Family Communication  :  None  Code Status :  Full  Consults  :  Cards, Ortho, ID  PUD Prophylaxis : PPI   Procedures  :     Right arm PICC line.    Right foot transmetatarsal amputation by Dr. [name] on 03/25/2023.  Echocardiogram.  1. Left ventricular ejection fraction, by estimation, is 70 to 75%. The left ventricle has hyperdynamic function. The left ventricle has no regional wall motion abnormalities. Left ventricular diastolic parameters are indeterminate.  2. Right ventricular systolic function is hyperdynamic. The right ventricular size is severely enlarged.  3. The mitral valve was not well visualized. No evidence of mitral valve regurgitation. No evidence of mitral stenosis.  4. The aortic valve was not well visualized. Aortic valve  regurgitation is not visualized.  5. The inferior vena cava is normal in size with greater than 50% respiratory variability, suggesting right atrial pressure of 3 mmHg.    CTA chest. 1. No CT evidence of pulmonary embolism. 2. Small bilateral pleural effusions with minimal compressive atelectasis of the lung bases. Pneumonia is not excluded.  Renal ultrasound.  Nonacute.    Lower extremity venous duplex.  No DVT.    ABI - Right: Resting right ankle-brachial index indicates mild right lower extremity arterial disease. Left: Resting left ankle-brachial index is within normal range. *See table(s) above for measurements and observations.      Disposition Plan  :    Status is: Inpatient   DVT Prophylaxis  :    SCD's Start: 03/25/23 1501 heparin injection 5,000 Units Start: 03/23/23 1800    Lab Results  Component Value Date   PLT 538 (H) 03/30/2023    Diet :  Diet Order             Diet Carb Modified Fluid consistency: Thin; Room service appropriate? Yes  Diet effective now                    Inpatient Medications  Scheduled Meds:  vitamin C  1,000 mg Oral Daily   aspirin EC  81 mg Oral Daily   atorvastatin  20 mg Oral Daily   buPROPion  150 mg Oral Daily   docusate sodium  200 mg Oral BID   ferrous sulfate  325 mg Oral BID WC   folic acid  1 mg Oral Daily   gabapentin  300 mg Oral BID   heparin injection (subcutaneous)  5,000 Units Subcutaneous Q8H   insulin aspart  0-15 Units Subcutaneous TID WC   insulin aspart  0-5 Units Subcutaneous QHS   insulin glargine-yfgn  22 Units Subcutaneous BID   metoCLOPramide (REGLAN) injection  10 mg Intravenous Q6H   metoprolol tartrate  25 mg Oral BID   metroNIDAZOLE  500 mg Oral  Q12H   midodrine  5 mg Oral BID WC   nutrition supplement (JUVEN)  1 packet Oral BID BM   pantoprazole  40 mg Oral Daily   zinc sulfate  220 mg Oral Daily   Continuous Infusions:  sodium chloride Stopped (03/28/23 1533)   cefTRIAXone (ROCEPHIN)   IV Stopped (03/29/23 0957)   DAPTOmycin (CUBICIN) 750 mg in sodium chloride 0.9 % IVPB 750 mg (03/29/23 1610)   magnesium sulfate bolus IVPB     PRN Meds:.sodium chloride, acetaminophen **OR** acetaminophen, acetaminophen, alum & mag hydroxide-simeth, bisacodyl, guaiFENesin-dextromethorphan, hydrALAZINE, labetalol, magnesium citrate, magnesium sulfate bolus IVPB, metoprolol tartrate, metoprolol tartrate, morphine injection, ondansetron, oxyCODONE, phenol, polyethylene glycol, senna-docusate, simethicone    Objective:   Vitals:   03/29/23 1812 03/29/23 1950 03/30/23 0432 03/30/23 0729  BP: 113/75 (!) 143/76 135/84 (!) 130/90  Pulse: 82 86 80 80  Resp: 15 20 20 16   Temp:  98 F (36.7 C)  99.1 F (37.3 C)  TempSrc:  Oral  Oral  SpO2: 97% 99%  98%  Weight:      Height:        Wt Readings from Last 3 Encounters:  03/28/23 (!) 146.6 kg  01/19/23 135.9 kg  09/08/22 133.1 kg     Intake/Output Summary (Last 24 hours) at 03/30/2023 0929 Last data filed at 03/30/2023 0600 Gross per 24 hour  Intake 1135 ml  Output 800 ml  Net 335 ml     Physical Exam  Awake Alert, No new F.N deficits, Normal affect .AT,PERRAL Supple Neck, No JVD,   Symmetrical Chest wall movement, Good air movement bilaterally, CTAB RRR,No Gallops,Rubs or new Murmurs,  +ve B.Sounds, Abd Soft, No tenderness,   R TMA under bandage - W.Vac, improving right lower extremity edema and swelling,      Data Review:    Recent Labs  Lab 03/26/23 0326 03/27/23 0658 03/28/23 0207 03/29/23 0222 03/30/23 0152  WBC 13.2* 9.1 8.5 8.9 9.6  HGB 7.8* 7.5* 7.6* 7.0* 8.3*  HCT 24.5* 23.3* 23.8* 21.8* 26.5*  PLT 470* 470* 507* 510* 538*  MCV 83.9 86.6 86.2 86.5 84.9  MCH 26.7 27.9 27.5 27.8 26.6  MCHC 31.8 32.2 31.9 32.1 31.3  RDW 15.0 15.2 15.3 15.1 16.3*  LYMPHSABS 1.7 1.2 1.4 1.5 1.8  MONOABS 1.0 0.8 0.7 0.7 0.8  EOSABS 0.2 0.3 0.3 0.3 0.3  BASOSABS 0.0 0.0 0.0 0.0 0.0    Recent Labs  Lab 03/24/23 0316  03/25/23 0109 03/25/23 0819 03/26/23 0326 03/27/23 0658 03/28/23 0207 03/29/23 0222 03/30/23 0152  NA 137   < >  --  136 135 135 136 137  K 3.6   < >  --  3.9 3.8 3.7 3.4* 3.9  CL 106   < >  --  103 103 102 105 109  CO2 22   < >  --  22 23 22 23 22   ANIONGAP 9   < >  --  11 9 11 8 6   GLUCOSE 126*   < >  --  210* 275* 190* 186* 140*  BUN 21*   < >  --  22* 20 16 12 10   CREATININE 1.41*   < >  --  1.46* 1.35* 1.10 1.13 1.07  AST 14*  --   --   --   --   --   --   --   ALT 14  --   --   --   --   --   --   --  ALKPHOS 50  --   --   --   --   --   --   --   BILITOT 0.4  --   --   --   --   --   --   --   ALBUMIN 1.6*  --   --   --   --   --   --   --   CRP  --   --  27.2* 25.5* 19.2* 15.7* 9.7*  --   PROCALCITON  --    < > 0.62 0.60 0.36 0.28 0.13 <0.10  INR  --   --  1.2  --   --   --   --   --   TSH 1.058  --   --   --   --   --   --   --   BNP  --   --  524.5* 218.5* 320.4* 314.3* 246.2*  --   MG 1.7  --   --  1.9 2.1 1.9 1.8  --   CALCIUM 8.2*   < >  --  8.1* 8.1* 8.1* 7.9* 8.2*   < > = values in this interval not displayed.     Recent Labs  Lab 03/24/23 0316 03/25/23 0109 03/25/23 0819 03/26/23 0326 03/27/23 0658 03/28/23 0207 03/29/23 0222 03/30/23 0152  CRP  --   --  27.2* 25.5* 19.2* 15.7* 9.7*  --   PROCALCITON  --    < > 0.62 0.60 0.36 0.28 0.13 <0.10  INR  --   --  1.2  --   --   --   --   --   TSH 1.058  --   --   --   --   --   --   --   BNP  --   --  524.5* 218.5* 320.4* 314.3* 246.2*  --   MG 1.7  --   --  1.9 2.1 1.9 1.8  --   CALCIUM 8.2*   < >  --  8.1* 8.1* 8.1* 7.9* 8.2*   < > = values in this interval not displayed.    Micro Results Recent Results (from the past 240 hour(s))  Blood culture (routine x 2)     Status: None   Collection Time: 03/22/23  8:23 PM   Specimen: BLOOD RIGHT HAND  Result Value Ref Range Status   Specimen Description BLOOD RIGHT HAND  Final   Special Requests   Final    BOTTLES DRAWN AEROBIC AND ANAEROBIC Blood Culture  adequate volume   Culture   Final    NO GROWTH 5 DAYS Performed at [facility], [address], [place], [place] [address]    Report Status 03/27/2023 FINAL  Final  Blood culture (routine x 2)     Status: None   Collection Time: 03/22/23  9:09 PM   Specimen: BLOOD  Result Value Ref Range Status   Specimen Description BLOOD SITE NOT SPECIFIED  Final   Special Requests   Final    BOTTLES DRAWN AEROBIC ONLY Blood Culture adequate volume   Culture   Final    NO GROWTH 5 DAYS Performed at [facility], [address] [place], [place] [address]    Report Status 03/27/2023 FINAL  Final  SARS Coronavirus 2 by RT PCR (hospital order, performed in [facility] hospital lab) *cepheid single result test* Anterior Nasal Swab     Status: None  Collection Time: 03/23/23  5:20 PM   Specimen: Anterior Nasal Swab  Result Value Ref Range Status   SARS Coronavirus 2 by RT PCR NEGATIVE NEGATIVE Final    Comment: Performed at [facility], [address] [place], [place] [address]  Surgical PCR screen     Status: Abnormal   Collection Time: 03/24/23  5:30 PM   Specimen: Nasal Mucosa; Nasal Swab  Result Value Ref Range Status   MRSA, PCR NEGATIVE NEGATIVE Final   Staphylococcus aureus POSITIVE (A) NEGATIVE Final    Comment: (NOTE) The Xpert SA Assay (FDA approved for NASAL specimens in patients 10 years of age and older), is one component of a comprehensive surveillance program. It is not intended to diagnose infection nor to guide or monitor treatment. Performed at [facility], [address], [place], [place] [address]   MRSA Next Gen by PCR, Nasal     Status: None   Collection Time: 03/25/23  6:40 AM   Specimen: Nasal Mucosa; Nasal Swab  Result Value Ref Range Status   MRSA by PCR Next Gen NOT DETECTED NOT DETECTED Final    Comment: (NOTE) The GeneXpert MRSA Assay (FDA approved for NASAL specimens only), is one component of a comprehensive MRSA colonization  surveillance program. It is not intended to diagnose MRSA infection nor to guide or monitor treatment for MRSA infections. Test performance is not FDA approved in patients less than 28 years old. Performed at [facility], [address], [place], [place] [address]   Aerobic/Anaerobic Culture w Gram Stain (surgical/deep wound)     Status: None (Preliminary result)   Collection Time: 03/25/23 12:30 PM   Specimen: Soft Tissue, Other  Result Value Ref Range Status   Specimen Description TISSUE RIGHT TRANSMETATARSAL  Final   Special Requests A  Final   Gram Stain NO WBC SEEN RARE GRAM POSITIVE COCCI IN PAIRS   Final   Culture   Final    FEW BACTEROIDES FRAGILIS BETA LACTAMASE POSITIVE CULTURE REINCUBATED FOR BETTER GROWTH Performed at [facility], [address] [place], [place] [address]    Report Status PENDING  Incomplete    Radiology Reports [place] EKG SITE RITE  Result Date: 03/29/2023 If Site Rite image not attached, placement could not be confirmed due to current cardiac rhythm.     Signature  -   Susa Raring M.D on 03/30/2023 at 9:29 AM   -  To page go to [email or web address]

## 2023-03-30 NOTE — Plan of Care (Signed)
  Problem: Coping: Goal: Ability to adjust to condition or change in health will improve Outcome: Progressing   Problem: Metabolic: Goal: Ability to maintain appropriate glucose levels will improve Outcome: Progressing   Problem: Nutritional: Goal: Maintenance of adequate nutrition will improve Outcome: Progressing   Problem: Skin Integrity: Goal: Risk for impaired skin integrity will decrease Outcome: Progressing   

## 2023-03-30 NOTE — Progress Notes (Signed)
Physical Therapy Treatment Patient Details Name: Taylor Hughes MRN: [id number] DOB: September 02, 1970 Today's Date: 03/30/2023   History of Present Illness 52 year old male presents 03/23/23 with infection of his right lower extremity. 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    PT Comments  Patient again making excellent progress. Ambulated 110 ft with RW and up to min assist (when lost his balance posteriorly x1). Stair goal has been met. Will continue with gait training and education on use of RW. HHPT for home safety evaluation.    If plan is discharge home, recommend the following: Assistance with cooking/housework;Help with stairs or ramp for entrance   Can travel by private vehicle        Equipment Recommendations  Rolling walker (2 wheels)    Recommendations for Other Services       Precautions / Restrictions Precautions Precautions: Fall Precaution Comments: R post-op shoe Restrictions Weight Bearing Restrictions: Yes RLE Weight Bearing: Non weight bearing Other Position/Activity Restrictions: MD prefers NWB but able to put weight on it for ambulation with post op shoe per ortho note     Mobility  Bed Mobility Overal bed mobility: Modified Independent Bed Mobility: Supine to Sit     Supine to sit: Modified independent (Device/Increase time), HOB elevated          Transfers Overall transfer level: Modified independent Equipment used: Rolling walker (2 wheels) Transfers: Sit to/from Stand Sit to Stand: Modified independent (Device/Increase time)           General transfer comment: uses momentum to come to stand, no assist needed    Ambulation/Gait Ambulation/Gait assistance: Min Chemical engineer (Feet): 110 Feet Assistive device: Rolling walker (2 wheels) Gait Pattern/deviations: Step-to pattern       General Gait Details: vc for sequencing with RW and Rt foot leading  with weight through his heel in post-op shoe; assist for posterior loss of balance   Stairs             Wheelchair Mobility     Tilt Bed    Modified Rankin (Stroke Patients Only)       Balance Overall balance assessment: Needs assistance Sitting-balance support: No upper extremity supported, Feet supported Sitting balance-Leahy Scale: Fair Sitting balance - Comments: EOB   Standing balance support: During functional activity, No upper extremity supported Standing balance-Leahy Scale: Good Standing balance comment: Standing to do pericare without UE support                            Cognition Arousal: Alert Behavior During Therapy: WFL for tasks assessed/performed, Anxious Overall Cognitive Status: Within Functional Limits for tasks assessed                                          Exercises      General Comments        Pertinent Vitals/Pain Pain Assessment Pain Assessment: No/denies pain Faces Pain Scale: Hurts a little bit    Home Living                          Prior Function            PT Goals (current goals can now be found in the care plan section) Acute Rehab PT Goals Patient  Stated Goal: be able to walk to the bathroom on his own PT Goal Formulation: With patient Time For Goal Achievement: 04/11/23 Potential to Achieve Goals: Good Progress towards PT goals: Progressing toward goals    Frequency    Min 1X/week      PT Plan      Co-evaluation              AM-PAC PT "6 Clicks" Mobility   Outcome Measure  Help needed turning from your back to your side while in a flat bed without using bedrails?: None Help needed moving from lying on your back to sitting on the side of a flat bed without using bedrails?: None Help needed moving to and from a bed to a chair (including a wheelchair)?: None Help needed standing up from a chair using your arms (e.g., wheelchair or bedside chair)?:  None Help needed to walk in hospital room?: A Little Help needed climbing 3-5 steps with a railing? : A Little 6 Click Score: 22    End of Session   Activity Tolerance: Patient tolerated treatment well Patient left: with call bell/phone within reach;in chair Nurse Communication: Mobility status PT Visit Diagnosis: Difficulty in walking, not elsewhere classified (R26.2)     Time: 4782-9562 PT Time Calculation (min) (ACUTE ONLY): 28 min  Charges:    $Gait Training: 23-37 mins PT General Charges $$ ACUTE PT VISIT: 1 Visit                      Jerolyn Center, PT Acute Rehabilitation Services  Office [phone]    Zena Amos 03/30/2023, 11:39 AM

## 2023-03-30 NOTE — Telephone Encounter (Signed)
I returned the call to patient's fiance, Kenney Houseman, and explained to her that I do not have a ROI from the patient allowing me to share information about him.  She said she understood and just had general questions about Medicaid,Medicare and disability.  I explained the qualifications for Medicare, which he would not qualify for at this time.  As far as Medicaid, I told her I was not sure if anyone in the hospital has addressed this with him but I told her she and speak to one of the Medicaid caseworkers at [facility] on the 4th floor  There is no guarantee that they will be able to assist but she can try.    As far as disability, She was requesting a letter from PCP stating he is not able to work  I informed her that the provider at the hospital would need to address this or he will need to be seen by PCP to address this issue.  She said she understood and will try to see the Medicaid caseworkers tomorrow.

## 2023-03-30 NOTE — Progress Notes (Addendum)
[facility]  Date of Admission:  03/22/2023   Total days of inpatient antibiotics 8  Principal Problem:   Diabetic foot infection (HCC) Active Problems:   Essential hypertension   Type 2 diabetes mellitus with other specified complication (HCC)   PAF (paroxysmal atrial fibrillation) (HCC)   Osteomyelitis (HCC)   HLD (hyperlipidemia)   Gastroparesis   Severe protein-calorie malnutrition (HCC)   Lymphedema   Abscess of right foot   Sinus tachycardia   Right ventricular enlargement          Assessment: 52 year old male with history of chest wall abscess, sternoclavicular joint infection osteomyelitis of fifth digit of the left foot admitted with right foot osteomyelitis status post TMA #Right foot osteomyelitis with abscess status post TMA with residual infection remaining - Taken to the OR on 8/14 which noted purulence extending proximal to the surgical margin on the plantar aspect - Or cultures are growing Bacteroides fragilis beta-lactamase positive reincubating  Recommendations: -Continue daptomycin, ceftriaxone, metronidazole for now - Cultures are incubating can potentially will DC daptomycin pending final cultures -PICC line has been ordered IV antibiotics.  Case management aware of insurance issues in the setting of coverage for IV antibiotics. - Plan on 6 weeks of antibiotics on the OR  #Diabetes mellitus - A1c 9.8, poorly controlled - Needs glycemic control for optimal wound healing. Microbiology:   Antibiotics: Cefepime 8/11 - 8/14 Metronidazole 8/12-present Daptomycin 8/15-present Ceftriaxone 8/15-present Vancomycin 8/11 - 8/15 Cultures: Blood 8/11 no growth   8/14 reincubating, few Bacteroides fragilis beta-lactamase positive SUBJECTIVE: Resting in bed.  No new complaints. Interval:  Afebrile overnight. Review of Systems: Review of Systems  All other systems reviewed and are negative.    Scheduled Meds:   vitamin C  1,000 mg Oral Daily   aspirin EC  81 mg Oral Daily   atorvastatin  20 mg Oral Daily   buPROPion  150 mg Oral Daily   docusate sodium  200 mg Oral BID   ferrous sulfate  325 mg Oral BID WC   folic acid  1 mg Oral Daily   gabapentin  300 mg Oral BID   heparin injection (subcutaneous)  5,000 Units Subcutaneous Q8H   insulin aspart  0-15 Units Subcutaneous TID WC   insulin aspart  0-5 Units Subcutaneous QHS   insulin glargine-yfgn  22 Units Subcutaneous BID   metoCLOPramide  10 mg Oral TID AC & HS   metoprolol tartrate  25 mg Oral BID   metroNIDAZOLE  500 mg Oral Q12H   midodrine  5 mg Oral BID WC   nutrition supplement (JUVEN)  1 packet Oral BID BM   pantoprazole  40 mg Oral Daily   zinc sulfate  220 mg Oral Daily   Continuous Infusions:  sodium chloride Stopped (03/28/23 1533)   cefTRIAXone (ROCEPHIN)  IV Stopped (03/29/23 0957)   DAPTOmycin (CUBICIN) 750 mg in sodium chloride 0.9 % IVPB 750 mg (03/29/23 1610)   magnesium sulfate bolus IVPB     PRN Meds:.sodium chloride, acetaminophen **OR** acetaminophen, acetaminophen, alum & mag hydroxide-simeth, bisacodyl, guaiFENesin-dextromethorphan, hydrALAZINE, labetalol, magnesium citrate, magnesium sulfate bolus IVPB, metoprolol tartrate, metoprolol tartrate, morphine injection, ondansetron, oxyCODONE, phenol, polyethylene glycol, senna-docusate, simethicone No Known Allergies  OBJECTIVE: Vitals:   03/29/23 1950 03/30/23 0432 03/30/23 0729 03/30/23 1145  BP: (!) 143/76 135/84 (!) 130/90 126/80  Pulse: 86 80 80 81  Resp: 20 20 16 11   Temp: 98 F (36.7 C)  99.1  F (37.3 C) 97.7 F (36.5 C)  TempSrc: Oral  Oral Oral  SpO2: 99%  98%   Weight:      Height:       Body mass index is 45.08 kg/m.  Physical Exam Constitutional:      General: He is not in acute distress.    Appearance: He is normal weight. He is not toxic-appearing.  HENT:     Head: Normocephalic and atraumatic.     Right Ear: External ear normal.      Left Ear: External ear normal.     Nose: No congestion or rhinorrhea.     Mouth/Throat:     Mouth: Mucous membranes are moist.     Pharynx: Oropharynx is clear.  Eyes:     Extraocular Movements: Extraocular movements intact.     Conjunctiva/sclera: Conjunctivae normal.     Pupils: Pupils are equal, round, and reactive to light.  Cardiovascular:     Rate and Rhythm: Normal rate and regular rhythm.     Heart sounds: No murmur heard.    No friction rub. No gallop.  Pulmonary:     Effort: Pulmonary effort is normal.     Breath sounds: Normal breath sounds.  Abdominal:     General: Abdomen is flat. Bowel sounds are normal.     Palpations: Abdomen is soft.  Musculoskeletal:        General: No swelling.     Cervical back: Normal range of motion and neck supple.     Comments: Left fooot wound vac  Skin:    General: Skin is warm and dry.  Neurological:     General: No focal deficit present.     Mental Status: He is oriented to person, place, and time.  Psychiatric:        Mood and Affect: Mood normal.       Lab Results Lab Results  Component Value Date   WBC 9.6 03/30/2023   HGB 8.3 (L) 03/30/2023   HCT 26.5 (L) 03/30/2023   MCV 84.9 03/30/2023   PLT 538 (H) 03/30/2023    Lab Results  Component Value Date   CREATININE 1.07 03/30/2023   BUN 10 03/30/2023   NA 137 03/30/2023   K 3.9 03/30/2023   CL 109 03/30/2023   CO2 22 03/30/2023    Lab Results  Component Value Date   ALT 14 03/24/2023   AST 14 (L) 03/24/2023   ALKPHOS 50 03/24/2023   BILITOT 0.4 03/24/2023        Danelle Earthly, MD [facility] 03/30/2023, 2:03 PM I have personally spent 55 minutes involved in face-to-face and non-face-to-face activities for this patient on the day of the visit. Professional time spent includes the following activities: Preparing to see the patient (review of tests), Obtaining and/or reviewing separately obtained history  (admission/discharge record), Performing a medically appropriate examination and/or evaluation , Ordering medications/tests/procedures, referring and communicating with other health care professionals, Documenting clinical information in the EMR, Independently interpreting results (not separately reported), Communicating results to the patient/family/caregiver, Counseling and educating the patient/family/caregiver and Care coordination (not separately reported).

## 2023-03-31 DIAGNOSIS — E11628 Type 2 diabetes mellitus with other skin complications: Secondary | ICD-10-CM | POA: Diagnosis not present

## 2023-03-31 DIAGNOSIS — L089 Local infection of the skin and subcutaneous tissue, unspecified: Secondary | ICD-10-CM | POA: Diagnosis not present

## 2023-03-31 LAB — GLUCOSE, CAPILLARY
Glucose-Capillary: 149 mg/dL — ABNORMAL HIGH (ref 70–99)
Glucose-Capillary: 121 mg/dL — ABNORMAL HIGH (ref 70–99)
Glucose-Capillary: 95 mg/dL (ref 70–99)
Glucose-Capillary: 147 mg/dL — ABNORMAL HIGH (ref 70–99)

## 2023-03-31 NOTE — Plan of Care (Signed)

## 2023-03-31 NOTE — Progress Notes (Signed)
Occupational Therapy Treatment Patient Details Name: Taylor Hughes MRN: [id number] DOB: 05/27/1971 Today's Date: 03/31/2023   History of present illness 52 year old male presents 03/23/23 with infection of his right lower extremity. 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   OT comments  Pt in good spirits, motivated to participate, no pain at rest or with activity. Pt able to perform bed mobility mod I, able to perform dressing with set up. Pt supervision for transfer/mobility, able to ambulate 200 feet today, no breaks, no LOB, supervision. Pt tolerated session well, states he wants to try using quad cane, may want one for home so he can progress to it from RW eventually. Pt would benefit from continued skilled OT to maximize functional independence prior to DC home, DC plan still appropriate.       If plan is discharge home, recommend the following:  A little help with walking and/or transfers;A little help with bathing/dressing/bathroom;Assistance with cooking/housework;Assist for transportation;Help with stairs or ramp for entrance   Equipment Recommendations  Tub/shower bench;BSC/3in1    Recommendations for Other Services      Precautions / Restrictions Precautions Precautions: Fall Precaution Comments: R post-op shoe Restrictions Weight Bearing Restrictions: Yes RLE Weight Bearing: Non weight bearing Other Position/Activity Restrictions: MD prefers NWB but able to put weight on it for ambulation with post op shoe per ortho note       Mobility Bed Mobility Overal bed mobility: Modified Independent                  Transfers Overall transfer level: Modified independent Equipment used: Rolling walker (2 wheels) Transfers: Sit to/from Stand Sit to Stand: Modified independent (Device/Increase time)           General transfer comment: supervision for mobility, mod I for STS, good  safety awareness and overall balance     Balance Overall balance assessment: Needs assistance Sitting-balance support: No upper extremity supported, Feet supported Sitting balance-Leahy Scale: Good Sitting balance - Comments: EOB ADLs   Standing balance support: During functional activity, No upper extremity supported Standing balance-Leahy Scale: Good Standing balance comment: standing ADLs, good balance                           ADL either performed or assessed with clinical judgement   ADL Overall ADL's : Needs assistance/impaired Eating/Feeding: Independent;Sitting   Grooming: Independent;Sitting           Upper Body Dressing : Modified independent;Sitting   Lower Body Dressing: Set up;Sit to/from stand   Toilet Transfer: Supervision/safety;Ambulation;Regular Toilet;Rolling walker (2 wheels)   Toileting- Clothing Manipulation and Hygiene: Modified independent;Sit to/from stand       Functional mobility during ADLs: Supervision/safety;Rolling walker (2 wheels) General ADL Comments: Pt improving, supervision using RW for transfers, set up for dressing, able to don/doff R post-op shoe, good safety awareness.    Extremity/Trunk Assessment Upper Extremity Assessment Upper Extremity Assessment: Overall WFL for tasks assessed            Vision       Perception     Praxis      Cognition Arousal: Alert Behavior During Therapy: WFL for tasks assessed/performed, Anxious Overall Cognitive Status: Within Functional Limits for tasks assessed  General Comments: AAOx4 and pleasant throughout session        Exercises      Shoulder Instructions       General Comments      Pertinent Vitals/ Pain       Pain Assessment Pain Assessment: No/denies pain  Home Living                                          Prior Functioning/Environment              Frequency  Min 1X/week         Progress Toward Goals  OT Goals(current goals can now be found in the care plan section)  Progress towards OT goals: Progressing toward goals  Acute Rehab OT Goals Patient Stated Goal: to improve endurance and return home OT Goal Formulation: With patient Time For Goal Achievement: 04/09/23 Potential to Achieve Goals: Good ADL Goals Pt Will Perform Lower Body Dressing: with supervision;sit to/from stand Pt Will Transfer to Toilet: with supervision;ambulating Pt Will Perform Toileting - Clothing Manipulation and hygiene: with modified independence;sitting/lateral leans Pt Will Perform Tub/Shower Transfer: with supervision;tub bench;ambulating  Plan      Co-evaluation                 AM-PAC OT "6 Clicks" Daily Activity     Outcome Measure   Help from another person eating meals?: None Help from another person taking care of personal grooming?: None Help from another person toileting, which includes using toliet, bedpan, or urinal?: A Little Help from another person bathing (including washing, rinsing, drying)?: A Little Help from another person to put on and taking off regular upper body clothing?: None Help from another person to put on and taking off regular lower body clothing?: A Little 6 Click Score: 21    End of Session Equipment Utilized During Treatment: Gait belt;Rolling walker (2 wheels)  OT Visit Diagnosis: Unsteadiness on feet (R26.81);Other abnormalities of gait and mobility (R26.89) Pain - Right/Left: Right Pain - part of body: Ankle and joints of foot   Activity Tolerance Patient tolerated treatment well   Patient Left in bed;with call bell/phone within reach   Nurse Communication Mobility status        Time: 1610-9604 OT Time Calculation (min): 24 min  Charges: OT General Charges $OT Visit: 1 Visit OT Treatments $Self Care/Home Management : 23-37 mins $Therapeutic Activity: 23-37 mins  Taylor Hughes, OTR/L   Taylor Hughes 03/31/2023, 2:31 PM

## 2023-03-31 NOTE — Plan of Care (Signed)
Awake, alert and oriented. Pt has rested quietly throughout the night with no distress noted. Up to BR with assist. Had 2 diarrhea stools. Stool softeners held tonight. Voids per urinal.  Medicated for pain with relief noted. Wound vac to right foot.    Problem: Education: Goal: Ability to describe self-care measures that may prevent or decrease complications (Diabetes Survival Skills Education) will improve Outcome: Progressing Goal: Individualized Educational Video(s) Outcome: Progressing   Problem: Coping: Goal: Ability to adjust to condition or change in health will improve Outcome: Progressing   Problem: Fluid Volume: Goal: Ability to maintain a balanced intake and output will improve Outcome: Progressing   Problem: Health Behavior/Discharge Planning: Goal: Ability to identify and utilize available resources and services will improve Outcome: Progressing Goal: Ability to manage health-related needs will improve Outcome: Progressing   Problem: Metabolic: Goal: Ability to maintain appropriate glucose levels will improve Outcome: Progressing   Problem: Nutritional: Goal: Maintenance of adequate nutrition will improve Outcome: Progressing Goal: Progress toward achieving an optimal weight will improve Outcome: Progressing   Problem: Skin Integrity: Goal: Risk for impaired skin integrity will decrease Outcome: Progressing   Problem: Tissue Perfusion: Goal: Adequacy of tissue perfusion will improve Outcome: Progressing   Problem: Education: Goal: Knowledge of General Education information will improve Description: Including pain rating scale, medication(s)/side effects and non-pharmacologic comfort measures Outcome: Progressing   Problem: Health Behavior/Discharge Planning: Goal: Ability to manage health-related needs will improve Outcome: Progressing   Problem: Clinical Measurements: Goal: Ability to maintain clinical measurements within normal limits will  improve Outcome: Progressing Goal: Will remain free from infection Outcome: Progressing Goal: Diagnostic test results will improve Outcome: Progressing Goal: Respiratory complications will improve Outcome: Progressing Goal: Cardiovascular complication will be avoided Outcome: Progressing   Problem: Activity: Goal: Risk for activity intolerance will decrease Outcome: Progressing   Problem: Nutrition: Goal: Adequate nutrition will be maintained Outcome: Progressing   Problem: Coping: Goal: Level of anxiety will decrease Outcome: Progressing   Problem: Elimination: Goal: Will not experience complications related to bowel motility Outcome: Progressing Goal: Will not experience complications related to urinary retention Outcome: Progressing   Problem: Pain Managment: Goal: General experience of comfort will improve Outcome: Progressing   Problem: Safety: Goal: Ability to remain free from injury will improve Outcome: Progressing   Problem: Skin Integrity: Goal: Risk for impaired skin integrity will decrease Outcome: Progressing   Problem: Education: Goal: Knowledge of the prescribed therapeutic regimen will improve Outcome: Progressing Goal: Ability to verbalize activity precautions or restrictions will improve Outcome: Progressing Goal: Understanding of discharge needs will improve Outcome: Progressing   Problem: Activity: Goal: Ability to perform//tolerate increased activity and mobilize with assistive devices will improve Outcome: Progressing   Problem: Clinical Measurements: Goal: Postoperative complications will be avoided or minimized Outcome: Progressing   Problem: Self-Care: Goal: Ability to meet self-care needs will improve Outcome: Progressing   Problem: Self-Concept: Goal: Ability to maintain and perform role responsibilities to the fullest extent possible will improve Outcome: Progressing   Problem: Pain Management: Goal: Pain level will  decrease with appropriate interventions Outcome: Progressing   Problem: Skin Integrity: Goal: Demonstration of wound healing without infection will improve Outcome: Progressing

## 2023-03-31 NOTE — Progress Notes (Signed)
PROGRESS NOTE                                                                                                                                                                                                             Patient Demographics:    Taylor Hughes, is a 52 y.o. male, DOB - 04-12-1971, ZOX:[id number]  Outpatient Primary MD for the patient is Hoy Register, MD    LOS - 8  Admit date - 03/22/2023    Chief Complaint  Patient presents with   Wound Infection   Hyperglycemia       Brief Narrative (HPI from H&P)   52 year old male with past medical history of T2DM, peripheral neuropathy, atrial fibrillation, HTN, and GERD. He presents with infection of his right lower extremity . Admitted with diabetic foot infection/osteo.  He was seen by orthopedics and cardiology, transferred to my service on 03/22/2023.   Subjective:   Patient in bed, appears comfortable, denies any headache, no fever, no chest pain or pressure, no shortness of breath , no abdominal pain. No focal weakness.   Assessment  & Plan :   Sepsis due to right diabetic foot ulcer present on admission.  Seen by orthopedics, currently on empiric IV antibiotics, sepsis pathophysiology improving although not completely resolved, follow cultures which are thus far negative, continue empiric IV antibiotics, ABI suggest mild right lower extremity arterial disease, and by orthopedics underwent renal right transmetatarsal amputation on 03/25/2023, unfortunately pus was extending up into the tissue, continue IV antibiotics, follow cultures, ID consulted, right arm PICC line placed 03/30/2023, likely 4 weeks of daptomycin, Rocephin and Flagyl.  ID waiting for final culture reports and recommendations.   Intermittent SVT and sinus tachycardia.  Due to #1 above.  Echocardiogram noted with preserved left sided function, stable TSH, CTA negative for PE, after amputation and  supportive care for infection tachycardia much improved.  Right lower extremity edema.  Present for the last month secondary to the infection, lower extremity venous duplex although limited but negative.  According to the patient he thinks now that his right leg has a propensity to swell from time to time, says his primary MD thinks he has chronic lymphedema.  Will defer chronic diagnosis to PCP.  Essential hypertension.  Pressure on the softer side, improved after IV fluids and midodrine, now tolerating beta-blocker, start  tapering off midodrine and monitor.  AKI.  Due to sepsis.  Hydrate and monitor hold nephrotoxins, stable renal ultrasound, improving, stop IV fluids and monitor.  Avoid nephrotoxins.  Anemia.  Combination of heme dilution, sepsis and iron deficiency on anemia panel, placed on oral iron and folic acid, type screen done, giving 1 unit of packed RBC on 03/29/2023, no signs of ongoing active brisk bleeding.  Check CBC again in the morning.  Dyslipidemia.  On statin.  Vomiting due to gastroparesis.  Much improved with supportive care.  Morbid obesity.  BMI 41.  Follow-up with PCP for weight loss.  DM type II.  On Lantus and SSI.  Was adjusted further on 03/27/2023 for better control.  Getting diabetic and insulin education.  Lab Results  Component Value Date   HGBA1C 9.8 (H) 03/23/2023   CBG (last 3)  Recent Labs    03/30/23 1707 03/30/23 2147 03/31/23 0809  GLUCAP 162* 187* 149*         Condition - Fair  Family Communication  :  None  Code Status :  Full  Consults  :  Cards, Ortho, ID  PUD Prophylaxis : PPI   Procedures  :     Right arm PICC line -  03/30/2023.    Right foot transmetatarsal amputation by Dr. [name] on 03/25/2023.  Echocardiogram.  1. Left ventricular ejection fraction, by estimation, is 70 to 75%. The left ventricle has hyperdynamic function. The left ventricle has no regional wall motion abnormalities. Left ventricular diastolic parameters  are indeterminate.  2. Right ventricular systolic function is hyperdynamic. The right ventricular size is severely enlarged.  3. The mitral valve was not well visualized. No evidence of mitral valve regurgitation. No evidence of mitral stenosis.  4. The aortic valve was not well visualized. Aortic valve regurgitation is not visualized.  5. The inferior vena cava is normal in size with greater than 50% respiratory variability, suggesting right atrial pressure of 3 mmHg.    CTA chest. 1. No CT evidence of pulmonary embolism. 2. Small bilateral pleural effusions with minimal compressive atelectasis of the lung bases. Pneumonia is not excluded.  Renal ultrasound.  Nonacute.    Lower extremity venous duplex.  No DVT.    ABI - Right: Resting right ankle-brachial index indicates mild right lower extremity arterial disease. Left: Resting left ankle-brachial index is within normal range. *See table(s) above for measurements and observations.      Disposition Plan  :    Status is: Inpatient   DVT Prophylaxis  :    SCD's Start: 03/25/23 1501 heparin injection 5,000 Units Start: 03/23/23 1800    Lab Results  Component Value Date   PLT 538 (H) 03/30/2023    Diet :  Diet Order             Diet Carb Modified Fluid consistency: Thin; Room service appropriate? Yes  Diet effective now                    Inpatient Medications  Scheduled Meds:  vitamin C  1,000 mg Oral Daily   aspirin EC  81 mg Oral Daily   atorvastatin  20 mg Oral Daily   buPROPion  150 mg Oral Daily   Chlorhexidine Gluconate Cloth  6 each Topical Daily   docusate sodium  200 mg Oral BID   ferrous sulfate  325 mg Oral BID WC   folic acid  1 mg Oral Daily   gabapentin  300 mg Oral  BID   heparin injection (subcutaneous)  5,000 Units Subcutaneous Q8H   insulin aspart  0-15 Units Subcutaneous TID WC   insulin aspart  0-5 Units Subcutaneous QHS   insulin glargine-yfgn  22 Units Subcutaneous BID   metoCLOPramide  10  mg Oral TID AC & HS   metoprolol tartrate  25 mg Oral BID   midodrine  5 mg Oral BID WC   nutrition supplement (JUVEN)  1 packet Oral BID BM   pantoprazole  40 mg Oral Daily   zinc sulfate  220 mg Oral Daily   Continuous Infusions:  sodium chloride Stopped (03/28/23 1533)   ampicillin-sulbactam (UNASYN) IV 3 g (03/31/23 0631)   DAPTOmycin (CUBICIN) 750 mg in sodium chloride 0.9 % IVPB 750 mg (03/30/23 1402)   magnesium sulfate bolus IVPB     PRN Meds:.sodium chloride, acetaminophen **OR** acetaminophen, acetaminophen, alum & mag hydroxide-simeth, bisacodyl, guaiFENesin-dextromethorphan, hydrALAZINE, labetalol, magnesium citrate, magnesium sulfate bolus IVPB, metoprolol tartrate, metoprolol tartrate, morphine injection, ondansetron, oxyCODONE, phenol, polyethylene glycol, senna-docusate, simethicone, sodium chloride flush    Objective:   Vitals:   03/30/23 1145 03/30/23 1707 03/30/23 2150 03/30/23 2310  BP: 126/80 (!) 158/87 119/69 127/78  Pulse: 81 82 76   Resp: 11 18    Temp: 97.7 F (36.5 C) 97.7 F (36.5 C) 97.9 F (36.6 C) 98 F (36.7 C)  TempSrc: Oral Oral Oral Oral  SpO2:    98%  Weight:      Height:        Wt Readings from Last 3 Encounters:  03/28/23 (!) 146.6 kg  01/19/23 135.9 kg  09/08/22 133.1 kg     Intake/Output Summary (Last 24 hours) at 03/31/2023 0915 Last data filed at 03/31/2023 0600 Gross per 24 hour  Intake 480 ml  Output 660 ml  Net -180 ml     Physical Exam  Awake Alert, No new F.N deficits, Normal affect [place].AT,PERRAL Supple Neck, No JVD,   Symmetrical Chest wall movement, Good air movement bilaterally, CTAB RRR,No Gallops,Rubs or new Murmurs,  +ve B.Sounds, Abd Soft, No tenderness,   R TMA under bandage - W.Vac, improving right lower extremity edema and swelling, right arm single-lumen PICC line    Data Review:    Recent Labs  Lab 03/26/23 0326 03/27/23 0658 03/28/23 0207 03/29/23 0222 03/30/23 0152  WBC 13.2* 9.1 8.5 8.9  9.6  HGB 7.8* 7.5* 7.6* 7.0* 8.3*  HCT 24.5* 23.3* 23.8* 21.8* 26.5*  PLT 470* 470* 507* 510* 538*  MCV 83.9 86.6 86.2 86.5 84.9  MCH 26.7 27.9 27.5 27.8 26.6  MCHC 31.8 32.2 31.9 32.1 31.3  RDW 15.0 15.2 15.3 15.1 16.3*  LYMPHSABS 1.7 1.2 1.4 1.5 1.8  MONOABS 1.0 0.8 0.7 0.7 0.8  EOSABS 0.2 0.3 0.3 0.3 0.3  BASOSABS 0.0 0.0 0.0 0.0 0.0    Recent Labs  Lab 03/25/23 0819 03/26/23 0326 03/27/23 0658 03/28/23 0207 03/29/23 0222 03/30/23 0152  NA  --  136 135 135 136 137  K  --  3.9 3.8 3.7 3.4* 3.9  CL  --  103 103 102 105 109  CO2  --  22 23 22 23 22   ANIONGAP  --  11 9 11 8 6   GLUCOSE  --  210* 275* 190* 186* 140*  BUN  --  22* 20 16 12 10   CREATININE  --  1.46* 1.35* 1.10 1.13 1.07  CRP 27.2* 25.5* 19.2* 15.7* 9.7*  --   PROCALCITON 0.62 0.60 0.36 0.28 0.13 <0.10  INR 1.2  --   --   --   --   --   BNP 524.5* 218.5* 320.4* 314.3* 246.2*  --   MG  --  1.9 2.1 1.9 1.8  --   CALCIUM  --  8.1* 8.1* 8.1* 7.9* 8.2*     Recent Labs  Lab 03/25/23 0819 03/26/23 0326 03/27/23 0658 03/28/23 0207 03/29/23 0222 03/30/23 0152  CRP 27.2* 25.5* 19.2* 15.7* 9.7*  --   PROCALCITON 0.62 0.60 0.36 0.28 0.13 <0.10  INR 1.2  --   --   --   --   --   BNP 524.5* 218.5* 320.4* 314.3* 246.2*  --   MG  --  1.9 2.1 1.9 1.8  --   CALCIUM  --  8.1* 8.1* 8.1* 7.9* 8.2*    Micro Results Recent Results (from the past 240 hour(s))  Blood culture (routine x 2)     Status: None   Collection Time: 03/22/23  8:23 PM   Specimen: BLOOD RIGHT HAND  Result Value Ref Range Status   Specimen Description BLOOD RIGHT HAND  Final   Special Requests   Final    BOTTLES DRAWN AEROBIC AND ANAEROBIC Blood Culture adequate volume   Culture   Final    NO GROWTH 5 DAYS Performed at [facility], [address], [place], [place] [address]    Report Status 03/27/2023 FINAL  Final  Blood culture (routine x 2)     Status: None   Collection Time: 03/22/23  9:09 PM   Specimen: BLOOD  Result Value Ref  Range Status   Specimen Description BLOOD SITE NOT SPECIFIED  Final   Special Requests   Final    BOTTLES DRAWN AEROBIC ONLY Blood Culture adequate volume   Culture   Final    NO GROWTH 5 DAYS Performed at [facility], [address] [place], [place] [address]    Report Status 03/27/2023 FINAL  Final  SARS Coronavirus 2 by RT PCR (hospital order, performed in [facility] hospital lab) *cepheid single result test* Anterior Nasal Swab     Status: None   Collection Time: 03/23/23  5:20 PM   Specimen: Anterior Nasal Swab  Result Value Ref Range Status   SARS Coronavirus 2 by RT PCR NEGATIVE NEGATIVE Final    Comment: Performed at [facility], [address] [place], [place] [address]  Surgical PCR screen     Status: Abnormal   Collection Time: 03/24/23  5:30 PM   Specimen: Nasal Mucosa; Nasal Swab  Result Value Ref Range Status   MRSA, PCR NEGATIVE NEGATIVE Final   Staphylococcus aureus POSITIVE (A) NEGATIVE Final    Comment: (NOTE) The Xpert SA Assay (FDA approved for NASAL specimens in patients 54 years of age and older), is one component of a comprehensive surveillance program. It is not intended to diagnose infection nor to guide or monitor treatment. Performed at [facility], [address], [place], [place] [address]   MRSA Next Gen by PCR, Nasal     Status: None   Collection Time: 03/25/23  6:40 AM   Specimen: Nasal Mucosa; Nasal Swab  Result Value Ref Range Status   MRSA by PCR Next Gen NOT DETECTED NOT DETECTED Final    Comment: (NOTE) The GeneXpert MRSA Assay (FDA approved for NASAL specimens only), is one component of a comprehensive MRSA colonization surveillance program. It is not intended to diagnose MRSA infection nor to guide or  monitor treatment for MRSA infections. Test performance is not FDA approved in patients less than 59 years old. Performed at [facility], [address] [place], [place] [address]   Aerobic/Anaerobic  Culture w Gram Stain (surgical/deep wound)     Status: None (Preliminary result)   Collection Time: 03/25/23 12:30 PM   Specimen: Soft Tissue, Other  Result Value Ref Range Status   Specimen Description TISSUE RIGHT TRANSMETATARSAL  Final   Special Requests A  Final   Gram Stain   Final    NO WBC SEEN RARE GRAM POSITIVE COCCI IN PAIRS Performed at [facility], [address], [place], [place] [address]    Culture   Final    FEW BACTEROIDES FRAGILIS BETA LACTAMASE POSITIVE FEW SCHAALIA ODONTOLYTICUS Standardized susceptibility testing for this organism is not available. FEW STREPTOCOCCUS INTERMEDIUS    Report Status PENDING  Incomplete    Radiology Reports [place] EKG SITE RITE  Result Date: 03/29/2023 If Site Rite image not attached, placement could not be confirmed due to current cardiac rhythm.     Signature  -   Susa Raring M.D on 03/31/2023 at 9:15 AM   -  To page go to [email or web address]

## 2023-04-01 DIAGNOSIS — E11628 Type 2 diabetes mellitus with other skin complications: Secondary | ICD-10-CM | POA: Diagnosis not present

## 2023-04-01 DIAGNOSIS — L089 Local infection of the skin and subcutaneous tissue, unspecified: Secondary | ICD-10-CM | POA: Diagnosis not present

## 2023-04-01 LAB — GLUCOSE, CAPILLARY
Glucose-Capillary: 132 mg/dL — ABNORMAL HIGH (ref 70–99)
Glucose-Capillary: 180 mg/dL — ABNORMAL HIGH (ref 70–99)
Glucose-Capillary: 91 mg/dL (ref 70–99)
Glucose-Capillary: 154 mg/dL — ABNORMAL HIGH (ref 70–99)

## 2023-04-01 LAB — CBC WITH DIFFERENTIAL/PLATELET
Abs Immature Granulocytes: 0.08 10*3/uL — ABNORMAL HIGH (ref 0.00–0.07)
Basophils Absolute: 0 10*3/uL (ref 0.0–0.1)
Basophils Relative: 0 %
Eosinophils Absolute: 0.2 10*3/uL (ref 0.0–0.5)
Eosinophils Relative: 3 %
HCT: 25.2 % — ABNORMAL LOW (ref 39.0–52.0)
Hemoglobin: 8 g/dL — ABNORMAL LOW (ref 13.0–17.0)
Immature Granulocytes: 1 %
Lymphocytes Relative: 23 %
Lymphs Abs: 1.6 10*3/uL (ref 0.7–4.0)
MCH: 27.5 pg (ref 26.0–34.0)
MCHC: 31.7 g/dL (ref 30.0–36.0)
MCV: 86.6 fL (ref 80.0–100.0)
Monocytes Absolute: 0.7 10*3/uL (ref 0.1–1.0)
Monocytes Relative: 10 %
Neutro Abs: 4.5 10*3/uL (ref 1.7–7.7)
Neutrophils Relative %: 63 %
Platelets: 541 10*3/uL — ABNORMAL HIGH (ref 150–400)
RBC: 2.91 MIL/uL — ABNORMAL LOW (ref 4.22–5.81)
RDW: 16.8 % — ABNORMAL HIGH (ref 11.5–15.5)
WBC: 7.2 10*3/uL (ref 4.0–10.5)
nRBC: 0 % (ref 0.0–0.2)

## 2023-04-01 LAB — BASIC METABOLIC PANEL
Anion gap: 8 (ref 5–15)
BUN: 10 mg/dL (ref 6–20)
CO2: 23 mmol/L (ref 22–32)
Calcium: 8.3 mg/dL — ABNORMAL LOW (ref 8.9–10.3)
Chloride: 107 mmol/L (ref 98–111)
Creatinine, Ser: 1.06 mg/dL (ref 0.61–1.24)
GFR, Estimated: >60 mL/min (ref >60)
Glucose, Bld: 64 mg/dL — ABNORMAL LOW (ref 70–99)
Potassium: 3.7 mmol/L (ref 3.5–5.1)
Sodium: 138 mmol/L (ref 135–145)

## 2023-04-01 LAB — MAGNESIUM: Magnesium: 1.5 mg/dL — ABNORMAL LOW (ref 1.7–2.4)

## 2023-04-01 NOTE — Progress Notes (Addendum)
RN rounded on pt for line care. Found PICC line dressing to be partially off at the top with insertion site/biopatch exposed. Bottom of PICC dressing reinforced with tape. Pt states he taped it himself due to dressing coming off. RN educated pt on infection risk and when to call primary RN and/or VAST to change dressing or reinforce. Dressing changed at this time. Primary RN notified and educated on when to call VAST to change or reinforce dressin/risk of infection.

## 2023-04-01 NOTE — Progress Notes (Signed)
PROGRESS NOTE                                                                                                                                                                                                             Patient Demographics:    Klinton Aldridge, is a 52 y.o. male, DOB - Jul 09, 1971, ZOX:[id number]  Outpatient Primary MD for the patient is Hoy Register, MD    LOS - 9  Admit date - 03/22/2023    Chief Complaint  Patient presents with   Wound Infection   Hyperglycemia       Brief Narrative (HPI from H&P)    52 year old male with past medical history of T2DM, peripheral neuropathy, atrial fibrillation, HTN, and GERD. He presents with infection of his right lower extremity . Admitted with diabetic foot infection/osteo.  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   Subjective:   Patient in bed, appears comfortable, he denies ant complaints today.   Assessment  & Plan :   Sepsis due to right diabetic foot ulcer present on admission.   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 Antibiotics management per ID, continue with daptomycin, Rocephin and Flagyl changed to Unasyn.   Intermittent SVT and sinus tachycardia.  Due to #1 above.  Echocardiogram noted with preserved left sided function, stable TSH, CTA negative for PE, after amputation and supportive care for infection tachycardia much improved.  Right lower extremity  edema.  Present for the last month secondary to the infection, lower extremity venous duplex although limited but negative.  According to the patient he thinks now that his right leg has a propensity to swell from time to time, says his primary MD thinks he has chronic lymphedema.  Will defer chronic diagnosis to PCP.  Essential hypertension.  Pressure on the softer side, improved after IV fluids and midodrine, now tolerating beta-blocker, start tapering off midodrine and monitor.  AKI.  Due to sepsis.  Hydrate and monitor hold  nephrotoxins, stable renal ultrasound, improving, stop IV fluids and monitor.  Avoid nephrotoxins.  Anemia.  Combination of heme dilution, sepsis and iron deficiency on anemia panel, placed on oral iron and folic acid, type screen done, giving 1 unit of packed RBC on 03/29/2023, no signs of ongoing active brisk bleeding.  Check CBC again in the morning.  Dyslipidemia.  On statin.  Vomiting due to gastroparesis.  Much improved with supportive care.  Morbid obesity.  BMI 41.  Follow-up with PCP for weight loss.  DM type II.  On Lantus and SSI.  Was adjusted further on 03/27/2023 for better control.  Getting diabetic and insulin education.  Lab Results  Component Value Date   HGBA1C 9.8 (H) 03/23/2023   CBG (last 3)  Recent Labs    03/31/23 2123 04/01/23 0809 04/01/23 1210  GLUCAP 147* 132* 180*         Condition - Fair  Family Communication  :  None  Code Status :  Full  Consults  :  Cards, Ortho, ID  PUD Prophylaxis : PPI   Procedures  :     Right arm PICC line -  03/30/2023.    Right foot transmetatarsal amputation by Dr. [name] on 03/25/2023.  Echocardiogram.  1. Left ventricular ejection fraction, by estimation, is 70 to 75%. The left ventricle has hyperdynamic function. The left ventricle has no regional wall motion abnormalities. Left ventricular diastolic parameters are indeterminate.  2. Right ventricular systolic function is hyperdynamic. The  right ventricular size is severely enlarged.  3. The mitral valve was not well visualized. No evidence of mitral valve regurgitation. No evidence of mitral stenosis.  4. The aortic valve was not well visualized. Aortic valve regurgitation is not visualized.  5. The inferior vena cava is normal in size with greater than 50% respiratory variability, suggesting right atrial pressure of 3 mmHg.    CTA chest. 1. No CT evidence of pulmonary embolism. 2. Small bilateral pleural effusions with minimal compressive atelectasis of the lung bases. Pneumonia is not excluded.  Renal ultrasound.  Nonacute.    Lower extremity venous duplex.  No DVT.    ABI - Right: Resting right ankle-brachial index indicates mild right lower extremity arterial disease. Left: Resting left ankle-brachial index is within normal range. *See table(s) above for measurements and observations.      Disposition Plan  :    Status is: Inpatient   DVT Prophylaxis  :    SCD's Start: 03/25/23 1501 heparin injection 5,000 Units Start: 03/23/23 1800    Lab Results  Component Value Date   PLT 541 (H) 04/01/2023    Diet :  Diet Order             Diet Carb Modified Fluid consistency: Thin; Room service appropriate? Yes  Diet effective now                    Inpatient Medications  Scheduled Meds:  vitamin C  1,000 mg Oral Daily   aspirin EC  81 mg Oral Daily   atorvastatin  20 mg Oral Daily   buPROPion  150 mg Oral Daily   Chlorhexidine Gluconate Cloth  6 each Topical Daily   docusate sodium  200 mg Oral BID   ferrous sulfate  325 mg Oral BID WC   folic acid  1 mg Oral Daily   gabapentin  300 mg Oral BID   heparin injection (subcutaneous)  5,000 Units Subcutaneous Q8H   insulin aspart  0-15 Units Subcutaneous TID WC   insulin aspart  0-5 Units Subcutaneous QHS   insulin glargine-yfgn  22 Units Subcutaneous BID   metoCLOPramide  10 mg Oral TID AC & HS   metoprolol tartrate  25 mg Oral BID   midodrine  5 mg  Oral BID WC   nutrition supplement (JUVEN)  1 packet Oral BID BM   pantoprazole  40 mg Oral Daily   zinc sulfate  220 mg Oral Daily   Continuous Infusions:  sodium chloride Stopped (03/28/23 1533)   ampicillin-sulbactam (UNASYN) IV 3 g (04/01/23 1001)   DAPTOmycin (CUBICIN) 750 mg in sodium chloride 0.9 % IVPB 750 mg (04/01/23 1233)   magnesium sulfate bolus IVPB     PRN Meds:.sodium chloride, acetaminophen **OR** acetaminophen, acetaminophen, alum & mag hydroxide-simeth, bisacodyl, guaiFENesin-dextromethorphan, hydrALAZINE, labetalol, magnesium citrate, magnesium sulfate bolus IVPB, metoprolol tartrate, metoprolol tartrate, morphine injection, ondansetron, oxyCODONE, phenol, polyethylene glycol, senna-docusate, simethicone, sodium chloride flush    Objective:   Vitals:   04/01/23 0501 04/01/23 0505 04/01/23 0623 04/01/23 0805  BP: (!) 175/95 (!) 178/100 (!) 162/92 (!) 149/91  Pulse:      Resp:      Temp: (!) 97.4 F (36.3 C) (!) 97.4 F (36.3 C)  98.1 F (36.7 C)  TempSrc: Oral Oral  Oral  SpO2:      Weight:      Height:        Wt Readings from Last 3 Encounters:  03/28/23 (!) 146.6 kg  01/19/23 135.9 kg  09/08/22 133.1 kg     Intake/Output Summary (Last 24 hours) at 04/01/2023 1335 Last data filed at 04/01/2023 0641 Gross per 24 hour  Intake 1175 ml  Output 340 ml  Net 835 ml     Physical Exam  Awake Alert, Oriented X 3, No new F.N deficits, Normal affect Symmetrical Chest wall movement, Good air movement bilaterally, CTAB RRR,No Gallops,Rubs or new Murmurs, No Parasternal Heave +ve B.Sounds, Abd Soft, No tenderness, No rebound - guarding or rigidity. R TMA under bandage - W.Vac, improving right lower extremity edema and swelling, right arm single-lumen PICC line    Data Review:    Recent Labs  Lab 03/27/23 0658 03/28/23 0207 03/29/23 0222 03/30/23 0152 04/01/23 0338  WBC 9.1 8.5 8.9 9.6 7.2  HGB 7.5* 7.6* 7.0* 8.3* 8.0*  HCT 23.3* 23.8* 21.8* 26.5*  25.2*  PLT 470* 507* 510* 538* 541*  MCV 86.6 86.2 86.5 84.9 86.6  MCH 27.9 27.5 27.8 26.6 27.5  MCHC 32.2 31.9 32.1 31.3 31.7  RDW 15.2 15.3 15.1 16.3* 16.8*  LYMPHSABS 1.2 1.4 1.5 1.8 1.6  MONOABS 0.8 0.7 0.7 0.8 0.7  EOSABS 0.3 0.3 0.3 0.3 0.2  BASOSABS 0.0 0.0 0.0 0.0 0.0    Recent Labs  Lab 03/26/23 0326 03/27/23 0658 03/28/23 0207 03/29/23 0222 03/30/23 0152 04/01/23 0338  NA 136 135 135 136 137 138  K 3.9 3.8 3.7 3.4* 3.9 3.7  CL 103 103 102 105 109 107  CO2 22 23 22 23 22 23   ANIONGAP 11 9 11 8 6 8   GLUCOSE 210* 275* 190* 186* 140* 64*  BUN 22* 20 16 12 10 10   CREATININE 1.46* 1.35* 1.10 1.13 1.07 1.06  CRP 25.5* 19.2* 15.7* 9.7*  --   --   PROCALCITON 0.60 0.36 0.28 0.13 <0.10  --   BNP 218.5* 320.4* 314.3* 246.2*  --   --   MG 1.9 2.1 1.9 1.8  --  1.5*  CALCIUM  8.1* 8.1* 8.1* 7.9* 8.2* 8.3*     Recent Labs  Lab 03/26/23 0326 03/27/23 0658 03/28/23 0207 03/29/23 0222 03/30/23 0152 04/01/23 0338  CRP 25.5* 19.2* 15.7* 9.7*  --   --   PROCALCITON 0.60 0.36 0.28 0.13 <0.10  --   BNP 218.5* 320.4* 314.3* 246.2*  --   --   MG 1.9 2.1 1.9 1.8  --  1.5*  CALCIUM 8.1* 8.1* 8.1* 7.9* 8.2* 8.3*    Micro Results Recent Results (from the past 240 hour(s))  Blood culture (routine x 2)     Status: None   Collection Time: 03/22/23  8:23 PM   Specimen: BLOOD RIGHT HAND  Result Value Ref Range Status   Specimen Description BLOOD RIGHT HAND  Final   Special Requests   Final    BOTTLES DRAWN AEROBIC AND ANAEROBIC Blood Culture adequate volume   Culture   Final    NO GROWTH 5 DAYS Performed at [facility], [address], [place], [place] [address]    Report Status 03/27/2023 FINAL  Final  Blood culture (routine x 2)     Status: None   Collection Time: 03/22/23  9:09 PM   Specimen: BLOOD  Result Value Ref Range Status   Specimen Description BLOOD SITE NOT SPECIFIED  Final   Special Requests   Final    BOTTLES DRAWN AEROBIC ONLY Blood Culture adequate  volume   Culture   Final    NO GROWTH 5 DAYS Performed at [facility], [address] [place], [place] [address]    Report Status 03/27/2023 FINAL  Final  SARS Coronavirus 2 by RT PCR (hospital order, performed in [facility] hospital lab) *cepheid single result test* Anterior Nasal Swab     Status: None   Collection Time: 03/23/23  5:20 PM   Specimen: Anterior Nasal Swab  Result Value Ref Range Status   SARS Coronavirus 2 by RT PCR NEGATIVE NEGATIVE Final    Comment: Performed at [facility], [address], [place], [place] [address]  Surgical PCR screen     Status: Abnormal   Collection Time: 03/24/23  5:30 PM   Specimen: Nasal Mucosa; Nasal Swab  Result Value Ref Range Status   MRSA, PCR NEGATIVE NEGATIVE Final   Staphylococcus aureus POSITIVE (A) NEGATIVE Final    Comment: (NOTE) The Xpert SA Assay (FDA approved for NASAL specimens in patients 37 years of age and older), is one component of a comprehensive surveillance program. It is not intended to diagnose infection nor to guide or monitor treatment. Performed at [facility], [address] [place], [place] [address]   MRSA Next Gen by PCR, Nasal     Status: None   Collection Time: 03/25/23  6:40 AM   Specimen: Nasal Mucosa; Nasal Swab  Result Value Ref Range Status   MRSA by PCR Next Gen NOT DETECTED NOT DETECTED Final    Comment: (NOTE) The GeneXpert MRSA Assay (FDA approved for NASAL specimens only), is one component of a comprehensive MRSA colonization surveillance program. It is not intended to diagnose MRSA infection nor to guide or monitor treatment for MRSA infections. Test performance is not FDA approved in patients less than 34 years old. Performed at [facility], [address] [place], [place] [address]   Aerobic/Anaerobic Culture w Gram Stain (surgical/deep wound)     Status: None (Preliminary result)   Collection Time: 03/25/23 12:30 PM   Specimen: Soft  Tissue, Other   Result Value Ref Range Status   Specimen Description TISSUE RIGHT TRANSMETATARSAL  Final   Special Requests A  Final   Gram Stain NO WBC SEEN RARE GRAM POSITIVE COCCI IN PAIRS   Final   Culture   Final    FEW BACTEROIDES FRAGILIS BETA LACTAMASE POSITIVE FEW SCHAALIA ODONTOLYTICUS Standardized susceptibility testing for this organism is not available. FEW STREPTOCOCCUS INTERMEDIUS Sent to [facility] for further susceptibility testing. Performed at [facility], [address] [place], [place] [address]    Report Status PENDING  Incomplete    Radiology Reports [place] EKG SITE RITE  Result Date: 03/29/2023 If Site Rite image not attached, placement could not be confirmed due to current cardiac rhythm.     Signature  -   Huey Bienenstock M.D on 04/01/2023 at 1:35 PM   -  To page go to [email or web address]

## 2023-04-01 NOTE — Progress Notes (Signed)
Physical Therapy Treatment Patient Details Name: Taylor Hughes MRN: [id number] DOB: 1971-03-11 Today's Date: 04/01/2023   History of Present Illness 52 year old male presents 03/23/23 with infection of his right lower extremity. 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    PT Comments  Patient requested to try quad cane. Instructed in transfers and gait with [facility] with pt requiring min cuing for sequencing (and to limit Left foot passing his Rt foot to keep as much weight on his rt heel as possible). Significantly decr velocity compared to RW, however pt hopes to incr confidence and be able to use LBQC instead of RW (eventually). If pt progresses well with [facility], will need to again practice up/down curb with this device.     If plan is discharge home, recommend the following: Assistance with cooking/housework;Help with stairs or ramp for entrance   Can travel by private vehicle        Equipment Recommendations  Rolling walker (2 wheels)    Recommendations for Other Services       Precautions / Restrictions Precautions Precautions: Fall Precaution Comments: R post-op shoe Restrictions Weight Bearing Restrictions: Yes RLE Weight Bearing: Non weight bearing Other Position/Activity Restrictions: MD prefers NWB but able to put weight on it for ambulation with post op shoe per ortho note     Mobility  Bed Mobility Overal bed mobility: Modified Independent Bed Mobility: Supine to Sit     Supine to sit: Modified independent (Device/Increase time), HOB elevated Sit to supine: Modified independent (Device/Increase time), HOB elevated        Transfers Overall transfer level: Modified independent Equipment used: Quad cane Transfers: Sit to/from Stand Sit to Stand: Contact guard assist           General transfer comment: guarding due to change to [facility] at pt request; no imbalance     Ambulation/Gait Ambulation/Gait assistance: Contact guard assist Gait Distance (Feet): 170 Feet Assistive device: Quad cane (large base) Gait Pattern/deviations: Step-to pattern, Decreased stride length Gait velocity: significantly decr with LBQC compared to RW Gait velocity interpretation: <1.31 ft/sec, indicative of household ambulator   General Gait Details: min vc for sequencing with LBQC and Rt foot leading with weight through his heel in post-op shoe;   Stairs             Wheelchair Mobility     Tilt Bed    Modified Rankin (Stroke Patients Only)       Balance Overall balance assessment: Needs assistance Sitting-balance support: No upper extremity supported, Feet supported Sitting balance-Leahy Scale: Good     Standing balance support: During functional activity, No upper extremity supported Standing balance-Leahy Scale: Good                              Cognition Arousal: Alert Behavior During Therapy: WFL for tasks assessed/performed, Anxious Overall Cognitive Status: Within Functional Limits for tasks assessed                                          Exercises      General Comments        Pertinent Vitals/Pain Pain Assessment Pain Assessment: No/denies pain    Home Living Family/patient expects to be discharged to:: Private residence Living Arrangements: Other relatives Available Help at Discharge: Family;Available PRN/intermittently  Type of Home: House Home Access: Stairs to enter Entrance Stairs-Rails: None Entrance Stairs-Number of Steps: 1 (pt reports can approach apartment another way with only curb to step up)   Home Layout: One level Home Equipment: None Additional Comments: Pt states he has no DME at home    Prior Function            PT Goals (current goals can now be found in the care plan section) Acute Rehab PT Goals Patient Stated Goal: be able to walk to the bathroom on his own Time For  Goal Achievement: 04/11/23 Potential to Achieve Goals: Good Progress towards PT goals: Progressing toward goals    Frequency    Min 1X/week      PT Plan      Co-evaluation              AM-PAC PT "6 Clicks" Mobility   Outcome Measure  Help needed turning from your back to your side while in a flat bed without using bedrails?: None Help needed moving from lying on your back to sitting on the side of a flat bed without using bedrails?: None Help needed moving to and from a bed to a chair (including a wheelchair)?: None Help needed standing up from a chair using your arms (e.g., wheelchair or bedside chair)?: None Help needed to walk in hospital room?: A Little Help needed climbing 3-5 steps with a railing? : A Little 6 Click Score: 22    End of Session   Activity Tolerance: Patient tolerated treatment well Patient left: with call bell/phone within reach;in chair Nurse Communication: Mobility status PT Visit Diagnosis: Difficulty in walking, not elsewhere classified (R26.2)     Time: 4098-1191 PT Time Calculation (min) (ACUTE ONLY): 38 min  Charges:    $Gait Training: 38-52 mins PT General Charges $$ ACUTE PT VISIT: 1 Visit                      Jerolyn Center, PT Acute Rehabilitation Services  Office [phone]    Zena Amos 04/01/2023, 9:05 AM

## 2023-04-01 NOTE — Plan of Care (Signed)
Pt has rested quietly throughout the night with no distress noted. Medicated for pain once with relief noted. Had 2 loose stools during shift. Stool softeners held. Up to BR with assist. Wound vac intact to right foot. Wears walk shoe when up. RUA PICC intact with dressing CDI.    Problem: Education: Goal: Ability to describe self-care measures that may prevent or decrease complications (Diabetes Survival Skills Education) will improve Outcome: Progressing Goal: Individualized Educational Video(s) Outcome: Progressing   Problem: Coping: Goal: Ability to adjust to condition or change in health will improve Outcome: Progressing   Problem: Fluid Volume: Goal: Ability to maintain a balanced intake and output will improve Outcome: Progressing   Problem: Health Behavior/Discharge Planning: Goal: Ability to identify and utilize available resources and services will improve Outcome: Progressing Goal: Ability to manage health-related needs will improve Outcome: Progressing   Problem: Metabolic: Goal: Ability to maintain appropriate glucose levels will improve Outcome: Progressing   Problem: Nutritional: Goal: Maintenance of adequate nutrition will improve Outcome: Progressing Goal: Progress toward achieving an optimal weight will improve Outcome: Progressing   Problem: Skin Integrity: Goal: Risk for impaired skin integrity will decrease Outcome: Progressing   Problem: Tissue Perfusion: Goal: Adequacy of tissue perfusion will improve Outcome: Progressing   Problem: Education: Goal: Knowledge of General Education information will improve Description: Including pain rating scale, medication(s)/side effects and non-pharmacologic comfort measures Outcome: Progressing   Problem: Health Behavior/Discharge Planning: Goal: Ability to manage health-related needs will improve Outcome: Progressing   Problem: Clinical Measurements: Goal: Ability to maintain clinical measurements within  normal limits will improve Outcome: Progressing Goal: Will remain free from infection Outcome: Progressing Goal: Diagnostic test results will improve Outcome: Progressing Goal: Respiratory complications will improve Outcome: Progressing Goal: Cardiovascular complication will be avoided Outcome: Progressing   Problem: Activity: Goal: Risk for activity intolerance will decrease Outcome: Progressing   Problem: Nutrition: Goal: Adequate nutrition will be maintained Outcome: Progressing   Problem: Coping: Goal: Level of anxiety will decrease Outcome: Progressing   Problem: Elimination: Goal: Will not experience complications related to bowel motility Outcome: Progressing Goal: Will not experience complications related to urinary retention Outcome: Progressing   Problem: Pain Managment: Goal: General experience of comfort will improve Outcome: Progressing   Problem: Safety: Goal: Ability to remain free from injury will improve Outcome: Progressing   Problem: Skin Integrity: Goal: Risk for impaired skin integrity will decrease Outcome: Progressing   Problem: Education: Goal: Knowledge of the prescribed therapeutic regimen will improve Outcome: Progressing Goal: Ability to verbalize activity precautions or restrictions will improve Outcome: Progressing Goal: Understanding of discharge needs will improve Outcome: Progressing   Problem: Activity: Goal: Ability to perform//tolerate increased activity and mobilize with assistive devices will improve Outcome: Progressing   Problem: Clinical Measurements: Goal: Postoperative complications will be avoided or minimized Outcome: Progressing   Problem: Self-Care: Goal: Ability to meet self-care needs will improve Outcome: Progressing   Problem: Self-Concept: Goal: Ability to maintain and perform role responsibilities to the fullest extent possible will improve Outcome: Progressing   Problem: Pain Management: Goal: Pain  level will decrease with appropriate interventions Outcome: Progressing   Problem: Skin Integrity: Goal: Demonstration of wound healing without infection will improve Outcome: Progressing

## 2023-04-02 DIAGNOSIS — E11628 Type 2 diabetes mellitus with other skin complications: Secondary | ICD-10-CM | POA: Diagnosis not present

## 2023-04-02 DIAGNOSIS — L089 Local infection of the skin and subcutaneous tissue, unspecified: Secondary | ICD-10-CM | POA: Diagnosis not present

## 2023-04-02 LAB — GLUCOSE, CAPILLARY
Glucose-Capillary: 101 mg/dL — ABNORMAL HIGH (ref 70–99)
Glucose-Capillary: 87 mg/dL (ref 70–99)
Glucose-Capillary: 80 mg/dL (ref 70–99)
Glucose-Capillary: 96 mg/dL (ref 70–99)

## 2023-04-02 NOTE — Plan of Care (Signed)
Problem: Education: Goal: Ability to describe self-care measures that may prevent or decrease complications (Diabetes Survival Skills Education) will improve 04/02/2023 0626 by Antony Madura, RN Outcome: Progressing 04/02/2023 0528 by Antony Madura, RN Outcome: Progressing Goal: Individualized Educational Video(s) 04/02/2023 0626 by Antony Madura, RN Outcome: Progressing 04/02/2023 0528 by Antony Madura, RN Outcome: Progressing   Problem: Coping: Goal: Ability to adjust to condition or change in health will improve 04/02/2023 0626 by Antony Madura, RN Outcome: Progressing 04/02/2023 0528 by Antony Madura, RN Outcome: Progressing   Problem: Fluid Volume: Goal: Ability to maintain a balanced intake and output will improve 04/02/2023 0626 by Antony Madura, RN Outcome: Progressing 04/02/2023 0528 by Antony Madura, RN Outcome: Progressing   Problem: Health Behavior/Discharge Planning: Goal: Ability to identify and utilize available resources and services will improve 04/02/2023 0626 by Antony Madura, RN Outcome: Progressing 04/02/2023 0528 by Antony Madura, RN Outcome: Progressing Goal: Ability to manage health-related needs will improve 04/02/2023 0626 by Antony Madura, RN Outcome: Progressing 04/02/2023 0528 by Antony Madura, RN Outcome: Progressing   Problem: Metabolic: Goal: Ability to maintain appropriate glucose levels will improve 04/02/2023 0626 by Antony Madura, RN Outcome: Progressing 04/02/2023 0528 by Antony Madura, RN Outcome: Progressing   Problem: Nutritional: Goal: Maintenance of adequate nutrition will improve 04/02/2023 0626 by Antony Madura, RN Outcome: Progressing 04/02/2023 0528 by Antony Madura, RN Outcome: Progressing Goal: Progress toward achieving an optimal weight will improve 04/02/2023 0626 by Antony Madura, RN Outcome: Progressing 04/02/2023 0528 by Antony Madura, RN Outcome: Progressing   Problem: Skin Integrity: Goal: Risk for impaired skin integrity will  decrease 04/02/2023 0626 by Antony Madura, RN Outcome: Progressing 04/02/2023 0528 by Antony Madura, RN Outcome: Progressing   Problem: Tissue Perfusion: Goal: Adequacy of tissue perfusion will improve 04/02/2023 0626 by Antony Madura, RN Outcome: Progressing 04/02/2023 0528 by Antony Madura, RN Outcome: Progressing   Problem: Education: Goal: Knowledge of General Education information will improve Description: Including pain rating scale, medication(s)/side effects and non-pharmacologic comfort measures 04/02/2023 0626 by Antony Madura, RN Outcome: Progressing 04/02/2023 0528 by Antony Madura, RN Outcome: Progressing   Problem: Health Behavior/Discharge Planning: Goal: Ability to manage health-related needs will improve 04/02/2023 0626 by Antony Madura, RN Outcome: Progressing 04/02/2023 0528 by Antony Madura, RN Outcome: Progressing   Problem: Clinical Measurements: Goal: Ability to maintain clinical measurements within normal limits will improve 04/02/2023 0626 by Antony Madura, RN Outcome: Progressing 04/02/2023 0528 by Antony Madura, RN Outcome: Progressing Goal: Will remain free from infection 04/02/2023 0626 by Antony Madura, RN Outcome: Progressing 04/02/2023 0528 by Antony Madura, RN Outcome: Progressing Goal: Diagnostic test results will improve 04/02/2023 0626 by Antony Madura, RN Outcome: Progressing 04/02/2023 0528 by Antony Madura, RN Outcome: Progressing Goal: Respiratory complications will improve 04/02/2023 0626 by Antony Madura, RN Outcome: Progressing 04/02/2023 0528 by Antony Madura, RN Outcome: Progressing Goal: Cardiovascular complication will be avoided 04/02/2023 0272 by Antony Madura, RN Outcome: Progressing 04/02/2023 0528 by Antony Madura, RN Outcome: Progressing   Problem: Activity: Goal: Risk for activity intolerance will decrease 04/02/2023 0626 by Antony Madura, RN Outcome: Progressing 04/02/2023 0528 by Antony Madura, RN Outcome: Progressing   Problem:  Nutrition: Goal: Adequate nutrition will be maintained 04/02/2023 0626 by Antony Madura, RN Outcome: Progressing 04/02/2023 0528 by Antony Madura, RN Outcome: Progressing   Problem: Coping: Goal: Level of anxiety will decrease 04/02/2023 0626 by Antony Madura, RN Outcome: Progressing 04/02/2023 0528 by Antony Madura, RN Outcome: Progressing   Problem: Elimination: Goal: Will not experience complications related to bowel motility 04/02/2023 0626  by Antony Madura, RN Outcome: Progressing 04/02/2023 0528 by Antony Madura, RN Outcome: Progressing Goal: Will not experience complications related to urinary retention 04/02/2023 0626 by Antony Madura, RN Outcome: Progressing 04/02/2023 0528 by Antony Madura, RN Outcome: Progressing   Problem: Pain Managment: Goal: General experience of comfort will improve 04/02/2023 0626 by Antony Madura, RN Outcome: Progressing 04/02/2023 0528 by Antony Madura, RN Outcome: Progressing   Problem: Safety: Goal: Ability to remain free from injury will improve 04/02/2023 0626 by Antony Madura, RN Outcome: Progressing 04/02/2023 0528 by Antony Madura, RN Outcome: Progressing   Problem: Skin Integrity: Goal: Risk for impaired skin integrity will decrease 04/02/2023 0626 by Antony Madura, RN Outcome: Progressing 04/02/2023 0528 by Antony Madura, RN Outcome: Progressing   Problem: Education: Goal: Knowledge of the prescribed therapeutic regimen will improve 04/02/2023 0626 by Antony Madura, RN Outcome: Progressing 04/02/2023 0528 by Antony Madura, RN Outcome: Progressing Goal: Ability to verbalize activity precautions or restrictions will improve 04/02/2023 0626 by Antony Madura, RN Outcome: Progressing 04/02/2023 0528 by Antony Madura, RN Outcome: Progressing Goal: Understanding of discharge needs will improve 04/02/2023 0626 by Antony Madura, RN Outcome: Progressing 04/02/2023 0528 by Antony Madura, RN Outcome: Progressing   Problem: Activity: Goal: Ability to  perform//tolerate increased activity and mobilize with assistive devices will improve 04/02/2023 0626 by Antony Madura, RN Outcome: Progressing 04/02/2023 0528 by Antony Madura, RN Outcome: Progressing   Problem: Clinical Measurements: Goal: Postoperative complications will be avoided or minimized 04/02/2023 0626 by Antony Madura, RN Outcome: Progressing 04/02/2023 0528 by Antony Madura, RN Outcome: Progressing   Problem: Self-Care: Goal: Ability to meet self-care needs will improve 04/02/2023 0626 by Antony Madura, RN Outcome: Progressing 04/02/2023 0528 by Antony Madura, RN Outcome: Progressing   Problem: Self-Concept: Goal: Ability to maintain and perform role responsibilities to the fullest extent possible will improve 04/02/2023 0626 by Antony Madura, RN Outcome: Progressing 04/02/2023 0528 by Antony Madura, RN Outcome: Progressing   Problem: Pain Management: Goal: Pain level will decrease with appropriate interventions 04/02/2023 0626 by Antony Madura, RN Outcome: Progressing 04/02/2023 0528 by Antony Madura, RN Outcome: Progressing   Problem: Skin Integrity: Goal: Demonstration of wound healing without infection will improve 04/02/2023 0626 by Antony Madura, RN Outcome: Progressing 04/02/2023 0528 by Antony Madura, RN Outcome: Progressing

## 2023-04-02 NOTE — Plan of Care (Signed)
Pt has rested quietly throughout the night with no distress noted. Medicated twice for pain with relief noted. Up BR with assist with walk boot on. Wound vac to right foot with dressing CDI. Right upper arm PICC intact with dressing CDI. Had 3 loose BM's. Stool softeners held.

## 2023-04-02 NOTE — Progress Notes (Signed)
Physical Therapy Treatment Patient Details Name: Taylor Hughes MRN: [id number] DOB: November 09, 1970 Today's Date: 04/02/2023   History of Present Illness 52 year old male presents 03/23/23 with infection of his right lower extremity. 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    PT Comments  Educated pt on use of RW to better off-load Rt foot (compared to use of quad cane) and pt in agreement. Min cues for sequencing and proximity with RW. Patient stated he will now be hospitalized an additional 6 weeks for IV antibiotics. Would be helpful if he could get home VAC set-up while here (portable unit) so he can progress to being modified independent. Will f/u with CM re: issue.    If plan is discharge home, recommend the following: Assistance with cooking/housework;Help with stairs or ramp for entrance   Can travel by private vehicle        Equipment Recommendations  Rolling walker (2 wheels)    Recommendations for Other Services       Precautions / Restrictions Precautions Precautions: Fall Precaution Comments: R post-op shoe Restrictions Weight Bearing Restrictions: Yes RLE Weight Bearing: Non weight bearing Other Position/Activity Restrictions: MD prefers NWB but able to put weight on it for ambulation with post op shoe per ortho note     Mobility  Bed Mobility Overal bed mobility: Modified Independent Bed Mobility: Supine to Sit     Supine to sit: Modified independent (Device/Increase time), HOB elevated     General bed mobility comments: able to perform without assistance    Transfers Overall transfer level: Needs assistance Equipment used: Rolling walker (2 wheels) Transfers: Sit to/from Stand Sit to Stand: Modified independent (Device/Increase time)           General transfer comment: no cues needed    Ambulation/Gait Ambulation/Gait assistance: Supervision Gait Distance (Feet): 170  Feet Assistive device: Rolling walker (2 wheels) Gait Pattern/deviations: Step-to pattern, Decreased stride length   Gait velocity interpretation: <1.8 ft/sec, indicate of risk for recurrent falls   General Gait Details: encouraged use of RW instead of quad cane as he can take more pressure off his rt foot with use of RW; min cues for walker proximity and sequencing (not letting left foot step beyond rt foot)   Stairs             Wheelchair Mobility     Tilt Bed    Modified Rankin (Stroke Patients Only)       Balance Overall balance assessment: Needs assistance Sitting-balance support: No upper extremity supported, Feet supported Sitting balance-Leahy Scale: Good     Standing balance support: During functional activity, No upper extremity supported Standing balance-Leahy Scale: Good                              Cognition Arousal: Alert Behavior During Therapy: WFL for tasks assessed/performed, Anxious Overall Cognitive Status: Within Functional Limits for tasks assessed                                 General Comments: alert and oriented, aware of WB precautions and need for post op shoe        Exercises      General Comments        Pertinent Vitals/Pain Pain Assessment Pain Assessment: No/denies pain    Home Living  Prior Function            PT Goals (current goals can now be found in the care plan section) Acute Rehab PT Goals Patient Stated Goal: be able to walk to the bathroom on his own Time For Goal Achievement: 04/11/23 Potential to Achieve Goals: Good Progress towards PT goals: Progressing toward goals    Frequency    Min 1X/week      PT Plan      Co-evaluation              AM-PAC PT "6 Clicks" Mobility   Outcome Measure  Help needed turning from your back to your side while in a flat bed without using bedrails?: None Help needed moving from lying on your  back to sitting on the side of a flat bed without using bedrails?: None Help needed moving to and from a bed to a chair (including a wheelchair)?: None Help needed standing up from a chair using your arms (e.g., wheelchair or bedside chair)?: None Help needed to walk in hospital room?: A Little Help needed climbing 3-5 steps with a railing? : A Little 6 Click Score: 22    End of Session   Activity Tolerance: Patient tolerated treatment well Patient left: with call bell/phone within reach;in chair   PT Visit Diagnosis: Difficulty in walking, not elsewhere classified (R26.2)     Time: 1531-1610 PT Time Calculation (min) (ACUTE ONLY): 39 min  Charges:    $Gait Training: 38-52 mins PT General Charges $$ ACUTE PT VISIT: 1 Visit                      Jerolyn Center, PT Acute Rehabilitation Services  Office [phone]    Zena Amos 04/02/2023, 4:17 PM

## 2023-04-02 NOTE — Progress Notes (Signed)
PROGRESS NOTE                                                                                                                                                                                                             Patient Demographics:    Taylor Hughes, is a 52 y.o. male, DOB - February 17, 1971, ZOX:[id number]  Outpatient Primary MD for the patient is Hoy Register, MD    LOS - 10  Admit date - 03/22/2023    Chief Complaint  Patient presents with   Wound Infection   Hyperglycemia       Brief Narrative (HPI from H&P)    52 year old male with past medical history of T2DM, peripheral neuropathy, atrial fibrillation, HTN, and GERD. He presents with infection of his right lower extremity . Admitted with diabetic foot infection/osteo.  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   Subjective:   Patient in bed, appears comfortable, he denies ant complaints today.   Assessment  & Plan :   Sepsis due to right diabetic foot ulcer present on admission.   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 -IV antibiotics per ID, continue with daptomycin, Rocephin and Flagyl changed to Unasyn 8/20.   Intermittent SVT and sinus tachycardia.  Due to #1 above.  Echocardiogram noted with preserved left sided function, stable TSH, CTA negative for PE, after amputation and supportive care for infection tachycardia much improved.  Right lower extremity edema.  Present for the last month secondary to the infection, lower extremity venous duplex  although limited but negative.  According to the patient he thinks now that his right leg has a propensity to swell from time to time, says his primary MD thinks he has chronic lymphedema.  Will defer chronic diagnosis to PCP.  Essential hypertension.  -Blood pressure low initially, requiring midodrine, now started to increase, it has been discontinued.   AKI.  Due to sepsis.  Hydrate and monitor hold nephrotoxins, stable renal ultrasound, improving, stop IV fluids and monitor.  Avoid nephrotoxins.  Anemia.  Combination  of heme dilution, sepsis and iron deficiency on anemia panel, placed on oral iron and folic acid, type screen done, giving 1 unit of packed RBC on 03/29/2023, no signs of ongoing active brisk bleeding.  Check CBC again in the morning.  Dyslipidemia.  On statin.  Vomiting due to gastroparesis.  Much improved with supportive care.  Morbid obesity.  BMI 41.  Follow-up with PCP for weight loss.  DM type II.  On Lantus and SSI.  Was adjusted further on 03/27/2023 for better control.  Getting diabetic and insulin education.  Lab Results  Component Value Date   HGBA1C 9.8 (H) 03/23/2023   CBG (last 3)  Recent Labs    04/01/23 2055 04/02/23 0905 04/02/23 1203  GLUCAP 154* 101* 87         Condition - Fair  Family Communication  :  None  Code Status :  Full  Consults  :  Cards, Ortho, ID  PUD Prophylaxis : PPI   Procedures  :     Right arm PICC line -  03/30/2023.    Right foot transmetatarsal amputation by Dr. [name] on 03/25/2023.  Echocardiogram.  1. Left ventricular ejection fraction, by estimation, is 70 to 75%. The left ventricle has hyperdynamic function. The left ventricle has no regional wall motion abnormalities. Left ventricular diastolic parameters are indeterminate.  2. Right ventricular systolic function is hyperdynamic. The right ventricular size is severely enlarged.  3. The mitral valve was not well visualized. No evidence of mitral valve  regurgitation. No evidence of mitral stenosis.  4. The aortic valve was not well visualized. Aortic valve regurgitation is not visualized.  5. The inferior vena cava is normal in size with greater than 50% respiratory variability, suggesting right atrial pressure of 3 mmHg.    CTA chest. 1. No CT evidence of pulmonary embolism. 2. Small bilateral pleural effusions with minimal compressive atelectasis of the lung bases. Pneumonia is not excluded.  Renal ultrasound.  Nonacute.    Lower extremity venous duplex.  No DVT.    ABI - Right: Resting right ankle-brachial index indicates mild right lower extremity arterial disease. Left: Resting left ankle-brachial index is within normal range. *See table(s) above for measurements and observations.      Disposition Plan  :    Status is: Inpatient   DVT Prophylaxis  :    SCD's Start: 03/25/23 1501 heparin injection 5,000 Units Start: 03/23/23 1800    Lab Results  Component Value Date   PLT 541 (H) 04/01/2023    Diet :  Diet Order             Diet Carb Modified Fluid consistency: Thin; Room service appropriate? Yes  Diet effective now                    Inpatient Medications  Scheduled Meds:  vitamin C  1,000 mg Oral Daily   aspirin EC  81 mg Oral Daily   atorvastatin  20 mg Oral Daily   buPROPion  150 mg Oral Daily   Chlorhexidine Gluconate Cloth  6 each Topical Daily   docusate sodium  200 mg Oral BID   ferrous sulfate  325 mg Oral BID WC   folic acid  1 mg Oral Daily   gabapentin  300 mg Oral BID   heparin injection (subcutaneous)  5,000 Units Subcutaneous Q8H   insulin aspart  0-15 Units Subcutaneous TID WC   insulin aspart  0-5 Units Subcutaneous QHS   insulin  glargine-yfgn  22 Units Subcutaneous BID   metoCLOPramide  10 mg Oral TID AC & HS   metoprolol tartrate  25 mg Oral BID   nutrition supplement (JUVEN)  1 packet Oral BID BM   pantoprazole  40 mg Oral Daily   zinc sulfate  220 mg Oral Daily   Continuous  Infusions:  sodium chloride Stopped (03/28/23 1533)   ampicillin-sulbactam (UNASYN) IV 3 g (04/02/23 1313)   DAPTOmycin (CUBICIN) 750 mg in sodium chloride 0.9 % IVPB 750 mg (04/01/23 1233)   magnesium sulfate bolus IVPB     PRN Meds:.sodium chloride, acetaminophen **OR** acetaminophen, acetaminophen, alum & mag hydroxide-simeth, bisacodyl, guaiFENesin-dextromethorphan, hydrALAZINE, labetalol, magnesium citrate, magnesium sulfate bolus IVPB, metoprolol tartrate, metoprolol tartrate, morphine injection, ondansetron, oxyCODONE, phenol, polyethylene glycol, senna-docusate, simethicone, sodium chloride flush    Objective:   Vitals:   04/01/23 2056 04/02/23 0043 04/02/23 0858 04/02/23 1207  BP:  (!) 143/84 (!) 148/94   Pulse:   85   Resp:   18   Temp: 98.1 F (36.7 C) 97.9 F (36.6 C) 97.8 F (36.6 C)   TempSrc: Oral Oral Oral Oral  SpO2:      Weight:      Height:        Wt Readings from Last 3 Encounters:  03/28/23 (!) 146.6 kg  01/19/23 135.9 kg  09/08/22 133.1 kg     Intake/Output Summary (Last 24 hours) at 04/02/2023 1411 Last data filed at 04/02/2023 4259 Gross per 24 hour  Intake 815 ml  Output 800 ml  Net 15 ml     Physical Exam  Awake Alert, Oriented X 3, No new F.N deficits, Normal affect Symmetrical Chest wall movement, Good air movement bilaterally, CTAB RRR,No Gallops,Rubs or new Murmurs, No Parasternal Heave +ve B.Sounds, Abd Soft, No tenderness, No rebound - guarding or rigidity. R TMA under bandage - W.Vac, improving right lower extremity edema and swelling, right arm single-lumen PICC line    Data Review:    Recent Labs  Lab 03/27/23 0658 03/28/23 0207 03/29/23 0222 03/30/23 0152 04/01/23 0338  WBC 9.1 8.5 8.9 9.6 7.2  HGB 7.5* 7.6* 7.0* 8.3* 8.0*  HCT 23.3* 23.8* 21.8* 26.5* 25.2*  PLT 470* 507* 510* 538* 541*  MCV 86.6 86.2 86.5 84.9 86.6  MCH 27.9 27.5 27.8 26.6 27.5  MCHC 32.2 31.9 32.1 31.3 31.7  RDW 15.2 15.3 15.1 16.3* 16.8*   LYMPHSABS 1.2 1.4 1.5 1.8 1.6  MONOABS 0.8 0.7 0.7 0.8 0.7  EOSABS 0.3 0.3 0.3 0.3 0.2  BASOSABS 0.0 0.0 0.0 0.0 0.0    Recent Labs  Lab 03/27/23 0658 03/28/23 0207 03/29/23 0222 03/30/23 0152 04/01/23 0338  NA 135 135 136 137 138  K 3.8 3.7 3.4* 3.9 3.7  CL 103 102 105 109 107  CO2 23 22 23 22 23   ANIONGAP 9 11 8 6 8   GLUCOSE 275* 190* 186* 140* 64*  BUN 20 16 12 10 10   CREATININE 1.35* 1.10 1.13 1.07 1.06  CRP 19.2* 15.7* 9.7*  --   --   PROCALCITON 0.36 0.28 0.13 <0.10  --   BNP 320.4* 314.3* 246.2*  --   --   MG 2.1 1.9 1.8  --  1.5*  CALCIUM 8.1* 8.1* 7.9* 8.2* 8.3*     Recent Labs  Lab 03/27/23 0658 03/28/23 0207 03/29/23 0222 03/30/23 0152 04/01/23 0338  CRP 19.2* 15.7* 9.7*  --   --   PROCALCITON 0.36 0.28 0.13 <0.10  --  BNP 320.4* 314.3* 246.2*  --   --   MG 2.1 1.9 1.8  --  1.5*  CALCIUM 8.1* 8.1* 7.9* 8.2* 8.3*    Micro Results Recent Results (from the past 240 hour(s))  SARS Coronavirus 2 by RT PCR (hospital order, performed in [facility] hospital lab) *cepheid single result test* Anterior Nasal Swab     Status: None   Collection Time: 03/23/23  5:20 PM   Specimen: Anterior Nasal Swab  Result Value Ref Range Status   SARS Coronavirus 2 by RT PCR NEGATIVE NEGATIVE Final    Comment: Performed at [facility] Lab, [address] [place], [place] [address]  Surgical PCR screen     Status: Abnormal   Collection Time: 03/24/23  5:30 PM   Specimen: Nasal Mucosa; Nasal Swab  Result Value Ref Range Status   MRSA, PCR NEGATIVE NEGATIVE Final   Staphylococcus aureus POSITIVE (A) NEGATIVE Final    Comment: (NOTE) The Xpert SA Assay (FDA approved for NASAL specimens in patients 45 years of age and older), is one component of a comprehensive surveillance program. It is not intended to diagnose infection nor to guide or monitor treatment. Performed at [facility], [address] [place], [place] [address]   MRSA Next Gen by PCR, Nasal      Status: None   Collection Time: 03/25/23  6:40 AM   Specimen: Nasal Mucosa; Nasal Swab  Result Value Ref Range Status   MRSA by PCR Next Gen NOT DETECTED NOT DETECTED Final    Comment: (NOTE) The GeneXpert MRSA Assay (FDA approved for NASAL specimens only), is one component of a comprehensive MRSA colonization surveillance program. It is not intended to diagnose MRSA infection nor to guide or monitor treatment for MRSA infections. Test performance is not FDA approved in patients less than 26 years old. Performed at [facility], [address], [place], [place] [address]   Aerobic/Anaerobic Culture w Gram Stain (surgical/deep wound)     Status: None (Preliminary result)   Collection Time: 03/25/23 12:30 PM   Specimen: Soft Tissue, Other  Result Value Ref Range Status   Specimen Description TISSUE RIGHT TRANSMETATARSAL  Final   Special Requests A  Final   Gram Stain NO WBC SEEN RARE GRAM POSITIVE COCCI IN PAIRS   Final   Culture   Final    FEW BACTEROIDES FRAGILIS BETA LACTAMASE POSITIVE FEW SCHAALIA ODONTOLYTICUS Standardized susceptibility testing for this organism is not available. FEW STREPTOCOCCUS INTERMEDIUS Sent to [facility] for further susceptibility testing. Performed at [facility], [address] [place], [place] [address]    Report Status PENDING  Incomplete  Susceptibility, Aer + Anaerob     Status: Abnormal   Collection Time: 03/25/23 12:30 PM  Result Value Ref Range Status   Suscept, Aer + Anaerob Preliminary report (A)  Final    Comment: (NOTE) Performed At: [facility] [address] [place], [place] [id number] Jolene Schimke MD MV:[id number]    Source of Sample STREP INTERMEDIUS/ RIGHT FOOT  Final    Comment: Performed at [facility], [address], [place], [place] [address]  Susceptibility Result     Status: Abnormal   Collection Time: 03/25/23 12:30 PM  Result Value Ref Range Status   Suscept Result 1 Comment (A)  Final     Comment: (NOTE) Streptococcus intermedius Identification performed by account, not confirmed by this laboratory. Performed At: [facility] [place] [address] [place], [place]  [id number] Jolene Schimke MD WU:[id number]     Radiology Reports No results found.    Signature  -   Huey Bienenstock M.D on 04/02/2023 at 2:11 PM   -  To page go to [email or web address]

## 2023-04-02 NOTE — Progress Notes (Signed)
Occupational Therapy Treatment Patient Details Name: Taylor Hughes MRN: [id number] DOB: 12-09-1970 Today's Date: 04/02/2023   History of present illness 52 year old male presents 03/23/23 with infection of his right lower extremity. 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   OT comments  Patient making good gains with OT treatment with Mod I for bed mobility and supervision to perform grooming standing at sink and toilet transfers. Patient motivated towards progress with mobility and performed ambulation in hallway. Discharge recommendations continue to be appropriate. Acute OT to continue to follow.       If plan is discharge home, recommend the following:  A little help with walking and/or transfers;A little help with bathing/dressing/bathroom;Assistance with cooking/housework;Assist for transportation;Help with stairs or ramp for entrance   Equipment Recommendations  Tub/shower bench;BSC/3in1    Recommendations for Other Services      Precautions / Restrictions Precautions Precautions: Fall Precaution Comments: R post-op shoe Restrictions Weight Bearing Restrictions: Yes RLE Weight Bearing: Non weight bearing Other Position/Activity Restrictions: MD prefers NWB but able to put weight on it for ambulation with post op shoe per ortho note       Mobility Bed Mobility Overal bed mobility: Modified Independent Bed Mobility: Supine to Sit     Supine to sit: Modified independent (Device/Increase time), HOB elevated Sit to supine: Modified independent (Device/Increase time), HOB elevated   General bed mobility comments: able to perform without assistance    Transfers Overall transfer level: Needs assistance Equipment used: Rolling walker (2 wheels) Transfers: Sit to/from Stand Sit to Stand: Contact guard assist           General transfer comment: supervision to CGA for safety      Balance Overall balance assessment: Needs assistance Sitting-balance support: No upper extremity supported, Feet supported Sitting balance-Leahy Scale: Good     Standing balance support: During functional activity, No upper extremity supported Standing balance-Leahy Scale: Good                             ADL either performed or assessed with clinical judgement   ADL Overall ADL's : Needs assistance/impaired     Grooming: Wash/dry hands;Wash/dry face;Supervision/safety;Standing               Lower Body Dressing: Set up;Sit to/from stand Lower Body Dressing Details (indicate cue type and reason): assistance with post-op shoe Toilet Transfer: Supervision/safety;Ambulation;Regular Toilet;Rolling walker (2 wheels)   Toileting- Clothing Manipulation and Hygiene: Modified independent;Sit to/from stand              Extremity/Trunk Assessment              Vision       Perception     Praxis      Cognition Arousal: Alert Behavior During Therapy: WFL for tasks assessed/performed, Anxious Overall Cognitive Status: Within Functional Limits for tasks assessed                                 General Comments: alert and oriented, aware of WB precautions and need for post op shoe        Exercises      Shoulder Instructions       General Comments      Pertinent Vitals/ Pain       Pain Assessment Pain Assessment: No/denies pain Pain Intervention(s): Monitored during session  Home Living                                          Prior Functioning/Environment              Frequency  Min 1X/week        Progress Toward Goals  OT Goals(current goals can now be found in the care plan section)  Progress towards OT goals: Progressing toward goals  Acute Rehab OT Goals Patient Stated Goal: go home OT Goal Formulation: With patient Time For Goal Achievement: 04/09/23 Potential to Achieve Goals: Good ADL  Goals Pt Will Perform Lower Body Dressing: with supervision;sit to/from stand Pt Will Transfer to Toilet: with supervision;ambulating Pt Will Perform Toileting - Clothing Manipulation and hygiene: with modified independence;sitting/lateral leans Pt Will Perform Tub/Shower Transfer: with supervision;tub bench;ambulating  Plan      Co-evaluation                 AM-PAC OT "6 Clicks" Daily Activity     Outcome Measure   Help from another person eating meals?: None Help from another person taking care of personal grooming?: None Help from another person toileting, which includes using toliet, bedpan, or urinal?: A Little Help from another person bathing (including washing, rinsing, drying)?: A Little Help from another person to put on and taking off regular upper body clothing?: None Help from another person to put on and taking off regular lower body clothing?: A Little 6 Click Score: 21    End of Session Equipment Utilized During Treatment: Gait belt;Rolling walker (2 wheels)  OT Visit Diagnosis: Unsteadiness on feet (R26.81);Other abnormalities of gait and mobility (R26.89)   Activity Tolerance Patient tolerated treatment well   Patient Left in bed;with call bell/phone within reach   Nurse Communication Mobility status        Time: 1610-9604 OT Time Calculation (min): 30 min  Charges: OT General Charges $OT Visit: 1 Visit OT Treatments $Self Care/Home Management : 8-22 mins $Therapeutic Activity: 8-22 mins  Alfonse Flavors, OTA Acute Rehabilitation Services  Office [phone]   Dewain Penning 04/02/2023, 8:57 AM

## 2023-04-03 ENCOUNTER — Other Ambulatory Visit: Payer: Self-pay

## 2023-04-03 ENCOUNTER — Ambulatory Visit: Payer: Self-pay | Admitting: Internal Medicine

## 2023-04-03 DIAGNOSIS — L089 Local infection of the skin and subcutaneous tissue, unspecified: Secondary | ICD-10-CM | POA: Diagnosis not present

## 2023-04-03 DIAGNOSIS — M86271 Subacute osteomyelitis, right ankle and foot: Secondary | ICD-10-CM | POA: Diagnosis not present

## 2023-04-03 DIAGNOSIS — E11628 Type 2 diabetes mellitus with other skin complications: Secondary | ICD-10-CM | POA: Diagnosis not present

## 2023-04-03 LAB — GLUCOSE, CAPILLARY
Glucose-Capillary: 52 mg/dL — ABNORMAL LOW (ref 70–99)
Glucose-Capillary: 53 mg/dL — ABNORMAL LOW (ref 70–99)
Glucose-Capillary: 54 mg/dL — ABNORMAL LOW (ref 70–99)
Glucose-Capillary: 76 mg/dL (ref 70–99)
Glucose-Capillary: 132 mg/dL — ABNORMAL HIGH (ref 70–99)
Glucose-Capillary: 169 mg/dL — ABNORMAL HIGH (ref 70–99)
Glucose-Capillary: 99 mg/dL (ref 70–99)
Glucose-Capillary: 136 mg/dL — ABNORMAL HIGH (ref 70–99)

## 2023-04-03 LAB — CK: Total CK: 135 U/L (ref 49–397)

## 2023-04-03 MED ORDER — INSULIN GLARGINE-YFGN 100 UNIT/ML ~~LOC~~ SOLN
16.0000 [IU] | Freq: Two times a day (BID) | SUBCUTANEOUS | Status: DC
  Administered 2023-04-03 – 2023-04-05 (×4): 16 [IU] via SUBCUTANEOUS
  Filled 2023-04-03 (×5): qty 0.16

## 2023-04-03 MED ORDER — MAGNESIUM SULFATE 2 GM/50ML IV SOLN
2.0000 g | Freq: Once | INTRAVENOUS | Status: AC
  Administered 2023-04-03: 2 g via INTRAVENOUS
  Filled 2023-04-03: qty 50

## 2023-04-03 NOTE — Progress Notes (Addendum)
Pharmacy Antibiotic Note  Taylor Hughes is a 52 y.o. male admitted on 03/22/2023 with R-foot osteo.  Pharmacy has been consulted for Daptomycin dosing.  Noted patient underwent transmetatarsal amputation 8/14 however abscess and necrotic tissue noted to extend into the midfoot. ID has been consulted for recommendations. Intra-op culture grew bacteroides fragilis, beta lactamase (+), schaalia odontolyticus, strep intermedius. Abx have been optimized to dapto/unasyn.   CK remains wnl Mg 1.5 on 8/21  Plan: - Daptomycin 750 mg IV (8 mg/kg AdjBW) every 24 hours - CK check with weekly labs Mag 2g IV x1 per Dr. Randol Kern   Height: 5\' 11"  (180.3 cm) Weight: (!) 146.6 kg (323 lb 3.1 oz) IBW/kg (Calculated) : 75.3  Temp (24hrs), Avg:98 F (36.7 C), Min:97.8 F (36.6 C), Max:98.2 F (36.8 C)  Recent Labs  Lab 03/28/23 0207 03/29/23 0222 03/30/23 0152 04/01/23 0338  WBC 8.5 8.9 9.6 7.2  CREATININE 1.10 1.13 1.07 1.06    Estimated Creatinine Clearance: 121 mL/min (by C-G formula based on SCr of 1.06 mg/dL).    No Known Allergies  Antimicrobials this admission: Vancomycin 8/11 >> 8/15  Cefepime 8/11 >> 8/15 Flagyl PO 8/12 >> 8/19  CTX 8/15 >> 8/19  Dapto 8/15 >> Unasyn 8/20>>   Dose adjustments this admission: N/a  Microbiology results: 8/14 MRSA: not detected  8/14 surgical (transmetatarsal tissue): bacteroides fragilis, beta lactamase (+), schaalia odontolyticus, strep intermedius 8/13 surgical PCR: pos for Staph but neg for MRSA 8/12 COVID: neg 8/11 Bcx: ngtdF  Taylor Hughes, PharmD, BCIDP, AAHIVP, CPP Infectious Disease Pharmacist 04/03/2023 11:43 AM

## 2023-04-03 NOTE — Progress Notes (Signed)
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 FEW STREPTOCOCCUS INTERMEDIUS     Pt has PICC, plan on 2 weeks IV till 8/27 then PO (doxy+ augmentin) to complete a total 6 week sof abx from OR on 8/14 Continue dapto+ unasyn,  Strep sens pending  Dr. Algis Liming will be covering this weekend.  New ID team next week.

## 2023-04-03 NOTE — Progress Notes (Signed)
PROGRESS NOTE                                                                                                                                                                                                             Patient Demographics:    Taylor Hughes, is a 52 y.o. male, DOB - 07/23/1971, FGH:[id number]  Outpatient Primary MD for the patient is Hoy Register, MD    LOS - 11  Admit date - 03/22/2023    Chief Complaint  Patient presents with   Wound Infection   Hyperglycemia       Brief Narrative (HPI from H&P)    52 year old male with past medical history of T2DM, peripheral neuropathy, atrial fibrillation, HTN, and GERD. He presents with infection of his right lower extremity . Admitted with diabetic foot infection/osteo.  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   Subjective:   Patient in bed, appears comfortable, he denies ant complaints today.   Assessment  & Plan :   Sepsis due to right diabetic foot ulcer present on admission.   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 -Antibiotics management per ID, plan to continue with IV antibiotics x 2 weeks until 8/27, then p.o. regimen doxycycline and Augmentin for total of 6 weeks from OR date 8/14.   Intermittent SVT and sinus tachycardia.  Due to #1 above.  Echocardiogram noted with preserved left sided function, stable TSH, CTA negative for PE, after amputation and supportive care for infection tachycardia much improved.  Right lower extremity edema.  Present  for the last month secondary to the infection, lower extremity venous duplex although limited but negative.  According to the patient he thinks now that his right leg has a propensity to swell from time to time, says his primary MD thinks he has chronic lymphedema.  Will defer chronic diagnosis to PCP.  Essential hypertension.  -Blood pressure low initially, requiring midodrine, now started to increase, it has been discontinued.   AKI.  Due to sepsis.  Hydrate and monitor hold nephrotoxins,  stable renal ultrasound, improving, stop IV fluids and monitor.  Avoid nephrotoxins.  Anemia.  Combination of heme dilution, sepsis and iron deficiency on anemia panel, placed on oral iron and folic acid, type screen done, giving 1 unit of packed RBC on 03/29/2023, no signs of ongoing active brisk bleeding.  Check CBC again in the morning.  Dyslipidemia.  On statin.  Vomiting due to gastroparesis.  Much improved with supportive care.  Morbid obesity.  BMI 41.  Follow-up with PCP for weight loss.  DM type II.  Poorly  controlled, with hyperglycemia, with A1c of 9.8.  CBG on the lower side, will decrease Semglee to 16 units twice daily.     Lab Results  Component Value Date   HGBA1C 9.8 (H) 03/23/2023   CBG (last 3)  Recent Labs    04/03/23 0642 04/03/23 0836 04/03/23 1217  GLUCAP 76 132* 169*         Condition - Fair  Family Communication  :  None  Code Status :  Full  Consults  :  Cards, Ortho, ID  PUD Prophylaxis : PPI   Procedures  :     Right arm PICC line -  03/30/2023.    Right foot transmetatarsal amputation by Dr. [name] on 03/25/2023.  Echocardiogram.  1. Left ventricular ejection fraction, by estimation, is 70 to 75%. The left ventricle has hyperdynamic function. The left ventricle has no regional wall motion abnormalities. Left ventricular diastolic parameters are indeterminate.  2. Right ventricular systolic function is hyperdynamic. The right ventricular size is severely  enlarged.  3. The mitral valve was not well visualized. No evidence of mitral valve regurgitation. No evidence of mitral stenosis.  4. The aortic valve was not well visualized. Aortic valve regurgitation is not visualized.  5. The inferior vena cava is normal in size with greater than 50% respiratory variability, suggesting right atrial pressure of 3 mmHg.    CTA chest. 1. No CT evidence of pulmonary embolism. 2. Small bilateral pleural effusions with minimal compressive atelectasis of the lung bases. Pneumonia is not excluded.  Renal ultrasound.  Nonacute.    Lower extremity venous duplex.  No DVT.    ABI - Right: Resting right ankle-brachial index indicates mild right lower extremity arterial disease. Left: Resting left ankle-brachial index is within normal range. *See table(s) above for measurements and observations.      Disposition Plan  :    Status is: Inpatient   DVT Prophylaxis  :    SCD's Start: 03/25/23 1501 heparin injection 5,000 Units Start: 03/23/23 1800    Lab Results  Component Value Date   PLT 541 (H) 04/01/2023    Diet :  Diet Order             Diet Carb Modified Fluid consistency: Thin; Room service appropriate? Yes  Diet effective now                    Inpatient Medications  Scheduled Meds:  vitamin C  1,000 mg Oral Daily   aspirin EC  81 mg Oral Daily   atorvastatin  20 mg Oral Daily   buPROPion  150 mg Oral Daily   Chlorhexidine Gluconate Cloth  6 each Topical Daily   docusate sodium  200 mg Oral BID   ferrous sulfate  325 mg Oral BID WC   folic acid  1 mg Oral Daily   gabapentin  300 mg Oral BID   heparin injection (subcutaneous)  5,000 Units Subcutaneous  Q8H   insulin aspart  0-15 Units Subcutaneous TID WC   insulin aspart  0-5 Units Subcutaneous QHS   insulin glargine-yfgn  16 Units Subcutaneous BID   metoCLOPramide  10 mg Oral TID AC & HS   metoprolol tartrate  25 mg Oral BID   nutrition supplement (JUVEN)  1 packet Oral BID BM    pantoprazole  40 mg Oral Daily   zinc sulfate  220 mg Oral Daily   Continuous Infusions:  sodium chloride Stopped (03/28/23 1533)   ampicillin-sulbactam (UNASYN) IV 3 g (04/03/23 1144)   DAPTOmycin (CUBICIN) 750 mg in sodium chloride 0.9 % IVPB 750 mg (04/03/23 1422)   magnesium sulfate bolus IVPB     PRN Meds:.sodium chloride, acetaminophen **OR** acetaminophen, acetaminophen, alum & mag hydroxide-simeth, bisacodyl, guaiFENesin-dextromethorphan, hydrALAZINE, labetalol, magnesium citrate, magnesium sulfate bolus IVPB, metoprolol tartrate, metoprolol tartrate, morphine injection, ondansetron, oxyCODONE, phenol, polyethylene glycol, senna-docusate, simethicone, sodium chloride flush    Objective:   Vitals:   04/02/23 2300 04/03/23 0300 04/03/23 0827 04/03/23 1308  BP: 132/77 (!) 143/88 (!) 140/86 (!) 150/91  Pulse: 77 77 80 76  Resp: 18 17 16 19   Temp: 97.8 F (36.6 C) 98.1 F (36.7 C) 98 F (36.7 C) 98.4 F (36.9 C)  TempSrc: Oral Oral Oral Oral  SpO2: 97% 97%  99%  Weight:      Height:        Wt Readings from Last 3 Encounters:  03/28/23 (!) 146.6 kg  01/19/23 135.9 kg  09/08/22 133.1 kg     Intake/Output Summary (Last 24 hours) at 04/03/2023 1518 Last data filed at 04/03/2023 1300 Gross per 24 hour  Intake 960 ml  Output --  Net 960 ml     Physical Exam  Awake Alert, Oriented X 3, No new F.N deficits, Normal affect Symmetrical Chest wall movement, Good air movement bilaterally, CTAB RRR,No Gallops,Rubs or new Murmurs, No Parasternal Heave +ve B.Sounds, Abd Soft, No tenderness, No rebound - guarding or rigidity. R TMA under bandage - W.Vac, improving right lower extremity edema and swelling, right arm single-lumen PICC line    Data Review:    Recent Labs  Lab 03/28/23 0207 03/29/23 0222 03/30/23 0152 04/01/23 0338  WBC 8.5 8.9 9.6 7.2  HGB 7.6* 7.0* 8.3* 8.0*  HCT 23.8* 21.8* 26.5* 25.2*  PLT 507* 510* 538* 541*  MCV 86.2 86.5 84.9 86.6  MCH 27.5  27.8 26.6 27.5  MCHC 31.9 32.1 31.3 31.7  RDW 15.3 15.1 16.3* 16.8*  LYMPHSABS 1.4 1.5 1.8 1.6  MONOABS 0.7 0.7 0.8 0.7  EOSABS 0.3 0.3 0.3 0.2  BASOSABS 0.0 0.0 0.0 0.0    Recent Labs  Lab 03/28/23 0207 03/29/23 0222 03/30/23 0152 04/01/23 0338  NA 135 136 137 138  K 3.7 3.4* 3.9 3.7  CL 102 105 109 107  CO2 22 23 22 23   ANIONGAP 11 8 6 8   GLUCOSE 190* 186* 140* 64*  BUN 16 12 10 10   CREATININE 1.10 1.13 1.07 1.06  CRP 15.7* 9.7*  --   --   PROCALCITON 0.28 0.13 <0.10  --   BNP 314.3* 246.2*  --   --   MG 1.9 1.8  --  1.5*  CALCIUM 8.1* 7.9* 8.2* 8.3*     Recent Labs  Lab 03/28/23 0207 03/29/23 0222 03/30/23 0152 04/01/23 0338  CRP 15.7* 9.7*  --   --   PROCALCITON 0.28 0.13 <0.10  --   BNP 314.3* 246.2*  --   --  MG 1.9 1.8  --  1.5*  CALCIUM 8.1* 7.9* 8.2* 8.3*    Micro Results Recent Results (from the past 240 hour(s))  Surgical PCR screen     Status: Abnormal   Collection Time: 03/24/23  5:30 PM   Specimen: Nasal Mucosa; Nasal Swab  Result Value Ref Range Status   MRSA, PCR NEGATIVE NEGATIVE Final   Staphylococcus aureus POSITIVE (A) NEGATIVE Final    Comment: (NOTE) The Xpert SA Assay (FDA approved for NASAL specimens in patients 39 years of age and older), is one component of a comprehensive surveillance program. It is not intended to diagnose infection nor to guide or monitor treatment. Performed at [facility], [address] [place], [place] [address]   MRSA Next Gen by PCR, Nasal     Status: None   Collection Time: 03/25/23  6:40 AM   Specimen: Nasal Mucosa; Nasal Swab  Result Value Ref Range Status   MRSA by PCR Next Gen NOT DETECTED NOT DETECTED Final    Comment: (NOTE) The GeneXpert MRSA Assay (FDA approved for NASAL specimens only), is one component of a comprehensive MRSA colonization surveillance program. It is not intended to diagnose MRSA infection nor to guide or monitor treatment for MRSA infections. Test performance  is not FDA approved in patients less than 64 years old. Performed at [facility], [address], [place], [place] [address]   Aerobic/Anaerobic Culture w Gram Stain (surgical/deep wound)     Status: None (Preliminary result)   Collection Time: 03/25/23 12:30 PM   Specimen: Soft Tissue, Other  Result Value Ref Range Status   Specimen Description TISSUE RIGHT TRANSMETATARSAL  Final   Special Requests A  Final   Gram Stain NO WBC SEEN RARE GRAM POSITIVE COCCI IN PAIRS   Final   Culture   Final    FEW BACTEROIDES FRAGILIS BETA LACTAMASE POSITIVE FEW SCHAALIA ODONTOLYTICUS Standardized susceptibility testing for this organism is not available. FEW STREPTOCOCCUS INTERMEDIUS Sent to [facility] for further susceptibility testing. Performed at [facility], [address], [place], [place] [address]    Report Status PENDING  Incomplete  Susceptibility, Aer + Anaerob     Status: Abnormal   Collection Time: 03/25/23 12:30 PM  Result Value Ref Range Status   Suscept, Aer + Anaerob Preliminary report (A)  Final    Comment: (NOTE) Performed At: [facility] [address] [place], [place] [id number] Jolene Schimke MD OZ:[id number]    Source of Sample STREP INTERMEDIUS/ RIGHT FOOT  Final    Comment: Performed at [facility], [address], [place], [place] [address]  Susceptibility Result     Status: Abnormal   Collection Time: 03/25/23 12:30 PM  Result Value Ref Range Status   Suscept Result 1 Comment (A)  Final    Comment: (NOTE) Streptococcus intermedius Identification performed by account, not confirmed by this laboratory. Performed At: [facility] [address] [place], [place] [id number] Jolene Schimke MD UU:[id number]     Radiology Reports No results found.    Signature  -   Huey Bienenstock M.D on 04/03/2023 at 3:18 PM   -  To page go to [email or web address]

## 2023-04-03 NOTE — Plan of Care (Signed)

## 2023-04-03 NOTE — Inpatient Diabetes Management (Signed)
Inpatient Diabetes Program Recommendations  AACE/ADA: New Consensus Statement on Inpatient Glycemic Control (2015)  Target Ranges:  Prepandial:   less than 140 mg/dL      Peak postprandial:   less than 180 mg/dL (1-2 hours)      Critically ill patients:  140 - 180 mg/dL   Lab Results  Component Value Date   GLUCAP 169 (H) 04/03/2023   HGBA1C 9.8 (H) 03/23/2023    Review of Glycemic Control  Latest Reference Range & Units 04/03/23 06:33 04/03/23 06:42 04/03/23 08:36 04/03/23 12:17  Glucose-Capillary 70 - 99 mg/dL 54 (L) 76 324 (H) 401 (H)   Diabetes history:  DM 2 Outpatient Diabetes medications:  Basaglar 30 units daily Humalog 0-12 units tid with meals Ozempic- NOT TAKING Current orders for Inpatient glycemic control:  Novolog 0-15 units tid with meals and HS Semglee 22 units bid  Inpatient Diabetes Program Recommendations:    Note low blood sugar this AM.  Consider reducing Semglee to 16 units bid.   Thanks,  Beryl Meager, RN, BC-ADM Inpatient Diabetes Coordinator Pager [phone]  (8a-5p)

## 2023-04-04 DIAGNOSIS — L089 Local infection of the skin and subcutaneous tissue, unspecified: Secondary | ICD-10-CM | POA: Diagnosis not present

## 2023-04-04 DIAGNOSIS — L02611 Cutaneous abscess of right foot: Secondary | ICD-10-CM | POA: Diagnosis not present

## 2023-04-04 DIAGNOSIS — E11628 Type 2 diabetes mellitus with other skin complications: Secondary | ICD-10-CM | POA: Diagnosis not present

## 2023-04-04 LAB — GLUCOSE, CAPILLARY
Glucose-Capillary: 181 mg/dL — ABNORMAL HIGH (ref 70–99)
Glucose-Capillary: 143 mg/dL — ABNORMAL HIGH (ref 70–99)
Glucose-Capillary: 105 mg/dL — ABNORMAL HIGH (ref 70–99)
Glucose-Capillary: 149 mg/dL — ABNORMAL HIGH (ref 70–99)

## 2023-04-04 LAB — AEROBIC/ANAEROBIC CULTURE W GRAM STAIN (SURGICAL/DEEP WOUND): Gram Stain: NONE SEEN

## 2023-04-04 LAB — SUSCEPTIBILITY RESULT

## 2023-04-04 LAB — SUSCEPTIBILITY, AER + ANAEROB

## 2023-04-04 MED ORDER — LOPERAMIDE HCL 2 MG PO CAPS
4.0000 mg | ORAL_CAPSULE | Freq: Once | ORAL | Status: AC
  Administered 2023-04-04: 4 mg via ORAL
  Filled 2023-04-04: qty 2

## 2023-04-04 NOTE — Plan of Care (Signed)

## 2023-04-04 NOTE — Progress Notes (Signed)
PROGRESS NOTE                                                                                                                                                                                                             Patient Demographics:    Taylor Hughes, is a 52 y.o. male, DOB - [birth year], ONG:[id number]  Outpatient Primary MD for the patient is Hoy Register, MD    LOS - 12  Admit date - 03/22/2023    Chief Complaint  Patient presents with   Wound Infection   Hyperglycemia       Brief Narrative (HPI from H&P)    52 year old male with past medical history of T2DM, peripheral neuropathy, atrial fibrillation, HTN, and GERD. He presents with infection of his right lower extremity . Admitted with diabetic foot infection/osteo.  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   Subjective:   Patient in bed, appears comfortable, he denies any complaints today.   Assessment  & Plan :   Sepsis due to right diabetic foot ulcer present on admission.   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 -Antibiotics management per ID, plan to continue with IV antibiotics x 2 weeks until 8/27, then p.o. regimen doxycycline and Augmentin for total of 6 weeks from OR date 8/14.   Intermittent SVT and sinus tachycardia.   - Due to #1 above.  Echocardiogram noted with preserved left sided function, stable TSH, CTA negative for PE, after amputation and supportive care for infection tachycardia much improved.  Right lower extremity edema.   -  Present for the last month secondary to the infection, lower extremity venous duplex although limited but negative.  According to the patient he thinks now that his right leg has a propensity to swell from time to time, says his primary MD thinks he has chronic lymphedema.  Will defer chronic diagnosis to PCP.  Essential hypertension.  -Blood pressure low initially, requiring midodrine, now started to increase, it has been discontinued.   AKI.  Due to sepsis.  Hydrate  and monitor hold nephrotoxins, stable renal ultrasound, improving, stop IV fluids and monitor.  Avoid nephrotoxins.  Anemia.  Combination of heme dilution, sepsis and iron deficiency on anemia panel, placed on oral iron and folic acid, type screen done, giving 1 unit of packed RBC on 03/29/2023, no signs of ongoing active brisk bleeding.  Check CBC again in the morning.  Dyslipidemia.  On statin.  Vomiting due to gastroparesis.  Much improved with supportive care.  Morbid obesity.  BMI 41.  Follow-up with PCP for weight loss.  DM type II.  Poorly  controlled, with hyperglycemia, with A1c of 9.8.  CBG on the lower side, will decrease Semglee to 16 units twice daily.     Lab Results  Component Value Date   HGBA1C 9.8 (H) 03/23/2023   CBG (last 3)  Recent Labs    04/03/23 2212 04/04/23 0907 04/04/23 1153  GLUCAP 136* 181* 143*         Condition - Fair  Family Communication  :  None  Code Status :  Full  Consults  :  Cards, Ortho, ID  PUD Prophylaxis : PPI   Procedures  :     Right arm PICC line -  03/30/2023.    Right foot transmetatarsal amputation by Dr. [name] on 03/25/2023.  Echocardiogram.  1. Left ventricular ejection fraction, by estimation, is 70 to 75%. The left ventricle has hyperdynamic function. The left ventricle has no regional wall motion abnormalities. Left ventricular diastolic parameters are indeterminate.  2. Right ventricular systolic function is hyperdynamic. The right ventricular size is  severely enlarged.  3. The mitral valve was not well visualized. No evidence of mitral valve regurgitation. No evidence of mitral stenosis.  4. The aortic valve was not well visualized. Aortic valve regurgitation is not visualized.  5. The inferior vena cava is normal in size with greater than 50% respiratory variability, suggesting right atrial pressure of 3 mmHg.    CTA chest. 1. No CT evidence of pulmonary embolism. 2. Small bilateral pleural effusions with minimal compressive atelectasis of the lung bases. Pneumonia is not excluded.  Renal ultrasound.  Nonacute.    Lower extremity venous duplex.  No DVT.    ABI - Right: Resting right ankle-brachial index indicates mild right lower extremity arterial disease. Left: Resting left ankle-brachial index is within normal range. *See table(s) above for measurements and observations.      Disposition Plan  : Plan to go home after finishing IV antibiotics.  Status is: Inpatient   DVT Prophylaxis  :    SCD's Start: 03/25/23 1501 heparin injection 5,000 Units Start: 03/23/23 1800    Lab Results  Component Value Date   PLT 541 (H) 04/01/2023    Diet :  Diet Order             Diet Carb Modified Fluid consistency: Thin; Room service appropriate? Yes  Diet effective now                    Inpatient Medications  Scheduled Meds:  vitamin C  1,000 mg Oral Daily   aspirin EC  81 mg Oral Daily   atorvastatin  20 mg Oral Daily   buPROPion  150 mg Oral Daily   Chlorhexidine Gluconate Cloth  6 each Topical Daily   docusate sodium  200 mg Oral BID   ferrous sulfate  325 mg Oral BID WC   folic acid  1 mg Oral Daily   gabapentin  300 mg Oral  BID   heparin injection (subcutaneous)  5,000 Units Subcutaneous Q8H   insulin aspart  0-15 Units Subcutaneous TID WC   insulin aspart  0-5 Units Subcutaneous QHS   insulin glargine-yfgn  16 Units Subcutaneous BID   metoCLOPramide  10 mg Oral TID AC & HS   metoprolol tartrate  25 mg Oral BID    nutrition supplement (JUVEN)  1 packet Oral BID BM   pantoprazole  40 mg Oral Daily   zinc sulfate  220 mg Oral Daily   Continuous Infusions:  sodium chloride Stopped (03/28/23 1533)   ampicillin-sulbactam (UNASYN) IV 3 g (04/04/23 1216)   DAPTOmycin (CUBICIN) 750 mg in sodium chloride 0.9 % IVPB 750 mg (04/04/23 1407)   magnesium sulfate bolus IVPB     PRN Meds:.sodium chloride, acetaminophen **OR** acetaminophen, acetaminophen, alum & mag hydroxide-simeth, bisacodyl, guaiFENesin-dextromethorphan, hydrALAZINE, labetalol, magnesium citrate, magnesium sulfate bolus IVPB, metoprolol tartrate, metoprolol tartrate, morphine injection, ondansetron, oxyCODONE, phenol, polyethylene glycol, senna-docusate, simethicone, sodium chloride flush    Objective:   Vitals:   04/03/23 2034 04/04/23 0103 04/04/23 0855 04/04/23 1155  BP: 133/79 (!) 140/84 (!) 140/81 (!) 124/99  Pulse: 78 74 73 75  Resp: 18 20    Temp: 97.8 F (36.6 C) 97.8 F (36.6 C) 98 F (36.7 C) 97.8 F (36.6 C)  TempSrc: Oral Oral  Oral  SpO2: 97% 95% 100%   Weight:      Height:        Wt Readings from Last 3 Encounters:  03/28/23 (!) 146.6 kg  01/19/23 135.9 kg  09/08/22 133.1 kg     Intake/Output Summary (Last 24 hours) at 04/04/2023 1430 Last data filed at 04/04/2023 0506 Gross per 24 hour  Intake 480.25 ml  Output 3 ml  Net 477.25 ml     Physical Exam  Awake Alert, Oriented X 3, No new F.N deficits, Normal affect Symmetrical Chest wall movement, Good air movement bilaterally, CTAB RRR,No Gallops,Rubs or new Murmurs, No Parasternal Heave +ve B.Sounds, Abd Soft, No tenderness, No rebound - guarding or rigidity.  R TMA under bandage - W.Vac, improving right lower extremity edema and swelling, right arm single-lumen PICC line    Data Review:    Recent Labs  Lab 03/29/23 0222 03/30/23 0152 04/01/23 0338  WBC 8.9 9.6 7.2  HGB 7.0* 8.3* 8.0*  HCT 21.8* 26.5* 25.2*  PLT 510* 538* 541*  MCV 86.5 84.9  86.6  MCH 27.8 26.6 27.5  MCHC 32.1 31.3 31.7  RDW 15.1 16.3* 16.8*  LYMPHSABS 1.5 1.8 1.6  MONOABS 0.7 0.8 0.7  EOSABS 0.3 0.3 0.2  BASOSABS 0.0 0.0 0.0    Recent Labs  Lab 03/29/23 0222 03/30/23 0152 04/01/23 0338  NA 136 137 138  K 3.4* 3.9 3.7  CL 105 109 107  CO2 23 22 23   ANIONGAP 8 6 8   GLUCOSE 186* 140* 64*  BUN 12 10 10   CREATININE 1.13 1.07 1.06  CRP 9.7*  --   --   PROCALCITON 0.13 <0.10  --   BNP 246.2*  --   --   MG 1.8  --  1.5*  CALCIUM 7.9* 8.2* 8.3*     Recent Labs  Lab 03/29/23 0222 03/30/23 0152 04/01/23 0338  CRP 9.7*  --   --   PROCALCITON 0.13 <0.10  --   BNP 246.2*  --   --   MG 1.8  --  1.5*  CALCIUM 7.9* 8.2* 8.3*    Micro Results No results found  for this or any previous visit (from the past 240 hour(s)).   Radiology Reports No results found.    Signature  -   Huey Bienenstock M.D on 04/04/2023 at 2:30 PM   -  To page go to [email or web address]

## 2023-04-05 DIAGNOSIS — E11628 Type 2 diabetes mellitus with other skin complications: Secondary | ICD-10-CM | POA: Diagnosis not present

## 2023-04-05 DIAGNOSIS — L089 Local infection of the skin and subcutaneous tissue, unspecified: Secondary | ICD-10-CM | POA: Diagnosis not present

## 2023-04-05 LAB — BASIC METABOLIC PANEL
Anion gap: 5 (ref 5–15)
BUN: 12 mg/dL (ref 6–20)
CO2: 23 mmol/L (ref 22–32)
Calcium: 8.5 mg/dL — ABNORMAL LOW (ref 8.9–10.3)
Chloride: 110 mmol/L (ref 98–111)
Creatinine, Ser: 0.95 mg/dL (ref 0.61–1.24)
GFR, Estimated: >60 mL/min (ref >60)
Glucose, Bld: 142 mg/dL — ABNORMAL HIGH (ref 70–99)
Potassium: 3.9 mmol/L (ref 3.5–5.1)
Sodium: 138 mmol/L (ref 135–145)

## 2023-04-05 LAB — CBC
HCT: 27.7 % — ABNORMAL LOW (ref 39.0–52.0)
Hemoglobin: 8.5 g/dL — ABNORMAL LOW (ref 13.0–17.0)
MCH: 27 pg (ref 26.0–34.0)
MCHC: 30.7 g/dL (ref 30.0–36.0)
MCV: 87.9 fL (ref 80.0–100.0)
Platelets: 523 10*3/uL — ABNORMAL HIGH (ref 150–400)
RBC: 3.15 MIL/uL — ABNORMAL LOW (ref 4.22–5.81)
RDW: 17.8 % — ABNORMAL HIGH (ref 11.5–15.5)
WBC: 5.5 10*3/uL (ref 4.0–10.5)
nRBC: 0 % (ref 0.0–0.2)

## 2023-04-05 LAB — GLUCOSE, CAPILLARY
Glucose-Capillary: 140 mg/dL — ABNORMAL HIGH (ref 70–99)
Glucose-Capillary: 45 mg/dL — ABNORMAL LOW (ref 70–99)
Glucose-Capillary: 75 mg/dL (ref 70–99)
Glucose-Capillary: 149 mg/dL — ABNORMAL HIGH (ref 70–99)
Glucose-Capillary: 158 mg/dL — ABNORMAL HIGH (ref 70–99)

## 2023-04-05 MED ORDER — RISAQUAD PO CAPS
2.0000 | ORAL_CAPSULE | Freq: Every day | ORAL | Status: DC
  Administered 2023-04-05 – 2023-04-07 (×3): 2 via ORAL
  Filled 2023-04-05 (×3): qty 2

## 2023-04-05 MED ORDER — INSULIN GLARGINE-YFGN 100 UNIT/ML ~~LOC~~ SOLN
14.0000 [IU] | Freq: Every day | SUBCUTANEOUS | Status: DC
  Administered 2023-04-06: 14 [IU] via SUBCUTANEOUS
  Filled 2023-04-05: qty 0.14

## 2023-04-05 MED ORDER — ALBUMIN HUMAN 25 % IV SOLN
25.0000 g | Freq: Four times a day (QID) | INTRAVENOUS | Status: AC
  Administered 2023-04-05 – 2023-04-06 (×4): 25 g via INTRAVENOUS
  Filled 2023-04-05 (×4): qty 100

## 2023-04-05 MED ORDER — FUROSEMIDE 10 MG/ML IJ SOLN
40.0000 mg | Freq: Every day | INTRAMUSCULAR | Status: DC
  Administered 2023-04-05 – 2023-04-07 (×3): 40 mg via INTRAVENOUS
  Filled 2023-04-05 (×3): qty 4

## 2023-04-05 MED ORDER — LOPERAMIDE HCL 2 MG PO CAPS
2.0000 mg | ORAL_CAPSULE | ORAL | Status: DC | PRN
  Administered 2023-04-05 – 2023-04-06 (×4): 2 mg via ORAL
  Filled 2023-04-05 (×4): qty 1

## 2023-04-05 NOTE — Plan of Care (Signed)

## 2023-04-05 NOTE — Plan of Care (Signed)
  Problem: Coping: Goal: Ability to adjust to condition or change in health will improve Outcome: Progressing   Problem: Fluid Volume: Goal: Ability to maintain a balanced intake and output will improve Outcome: Progressing   Problem: Health Behavior/Discharge Planning: Goal: Ability to manage health-related needs will improve Outcome: Progressing   Problem: Nutritional: Goal: Maintenance of adequate nutrition will improve Outcome: Progressing   Problem: Skin Integrity: Goal: Risk for impaired skin integrity will decrease Outcome: Progressing

## 2023-04-05 NOTE — Progress Notes (Signed)
PROGRESS NOTE                                                                                                                                                                                                             Patient Demographics:    Taylor Hughes, is a 52 y.o. male, DOB - 05/15/1971, WJX:[id number]  Outpatient Primary MD for the patient is Hoy Register, MD    LOS - 13  Admit date - 03/22/2023    Chief Complaint  Patient presents with   Wound Infection   Hyperglycemia       Brief Narrative (HPI from H&P)     52 year old male with past medical history of T2DM, peripheral neuropathy, atrial fibrillation, HTN, and GERD. He presents with infection of his right lower extremity . Admitted with diabetic foot infection/osteo.  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   Subjective:   Patient in bed, appears comfortable, he reported diarrhea.   Assessment  & Plan :   Sepsis due to right diabetic foot ulcer present on admission.   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 -Antibiotics management per ID, plan to continue with IV antibiotics x 2 weeks until 8/27, then p.o. regimen doxycycline and Augmentin for total of 6 weeks from OR date 8/14.   Intermittent SVT and sinus tachycardia.   - Due to #1 above.  Echocardiogram noted with preserved left sided function, stable TSH, CTA negative for PE, after amputation and supportive care for infection tachycardia much improved.  Right lower extremity edema.   - Present  for the last month secondary to the infection, lower extremity venous duplex although limited but negative.  According to the patient he thinks now that his right leg has a propensity to swell from time to time, says his primary MD thinks he has chronic lymphedema.  Will defer chronic diagnosis to PCP.  Essential hypertension.  -Blood pressure low initially, requiring midodrine, now started to increase, it has been discontinued.   AKI.  Due to sepsis.  Hydrate and  monitor hold nephrotoxins, stable renal ultrasound, improving, stop IV fluids and monitor.  Avoid nephrotoxins.  Anemia.  Combination of heme dilution, sepsis and iron deficiency on anemia panel, placed on oral iron and folic acid, type screen done, giving 1 unit of packed RBC on 03/29/2023, no signs of ongoing active brisk bleeding.  Check CBC again in the morning.  Dyslipidemia.  On statin.  Vomiting due to gastroparesis.  Much improved with supportive care.  Morbid obesity.  BMI 41.  Follow-up with PCP for weight loss.  DM type II.  Poorly  controlled, with hyperglycemia, with A1c of 9.8.   -CT BG is low again today, despite been lowered to 16 units twice daily yesterday, so I will DC evening Semglee, and decrease daytime Semglee to 14 units daily.    Lab Results  Component Value Date   HGBA1C 9.8 (H) 03/23/2023   CBG (last 3)  Recent Labs    04/05/23 0840 04/05/23 1131 04/05/23 1215  GLUCAP 140* 45* 75         Condition - Fair  Family Communication  :  None  Code Status :  Full  Consults  :  Cards, Ortho, ID  PUD Prophylaxis : PPI   Procedures  :     Right arm PICC line -  03/30/2023.    Right foot transmetatarsal amputation by Dr. [name] on 03/25/2023.  Echocardiogram.  1. Left ventricular ejection fraction, by estimation, is 70 to 75%. The left ventricle has hyperdynamic function. The left ventricle has no regional wall motion abnormalities. Left ventricular diastolic parameters are indeterminate.  2.  Right ventricular systolic function is hyperdynamic. The right ventricular size is severely enlarged.  3. The mitral valve was not well visualized. No evidence of mitral valve regurgitation. No evidence of mitral stenosis.  4. The aortic valve was not well visualized. Aortic valve regurgitation is not visualized.  5. The inferior vena cava is normal in size with greater than 50% respiratory variability, suggesting right atrial pressure of 3 mmHg.    CTA chest. 1. No CT evidence of pulmonary embolism. 2. Small bilateral pleural effusions with minimal compressive atelectasis of the lung bases. Pneumonia is not excluded.  Renal ultrasound.  Nonacute.    Lower extremity venous duplex.  No DVT.    ABI - Right: Resting right ankle-brachial index indicates mild right lower extremity arterial disease. Left: Resting left ankle-brachial index is within normal range. *See table(s) above for measurements and observations.      Disposition Plan  : Plan to go home after finishing IV antibiotics.  Status is: Inpatient   DVT Prophylaxis  :    SCD's Start: 03/25/23 1501 heparin injection 5,000 Units Start: 03/23/23 1800    Lab Results  Component Value Date   PLT 523 (H) 04/05/2023    Diet :  Diet Order             Diet Carb Modified Fluid consistency: Thin; Room service appropriate? Yes  Diet effective now                    Inpatient Medications  Scheduled Meds:  vitamin C  1,000 mg Oral Daily   aspirin EC  81 mg Oral Daily   atorvastatin  20 mg Oral Daily   buPROPion  150 mg Oral Daily   Chlorhexidine Gluconate Cloth  6 each Topical Daily   docusate sodium  200 mg Oral BID   ferrous sulfate  325 mg Oral BID WC  folic acid  1 mg Oral Daily   gabapentin  300 mg Oral BID   heparin injection (subcutaneous)  5,000 Units Subcutaneous Q8H   insulin aspart  0-15 Units Subcutaneous TID WC   insulin aspart  0-5 Units Subcutaneous QHS   insulin glargine-yfgn  16 Units Subcutaneous BID    metoCLOPramide  10 mg Oral TID AC & HS   metoprolol tartrate  25 mg Oral BID   nutrition supplement (JUVEN)  1 packet Oral BID BM   pantoprazole  40 mg Oral Daily   zinc sulfate  220 mg Oral Daily   Continuous Infusions:  sodium chloride Stopped (03/28/23 1533)   ampicillin-sulbactam (UNASYN) IV 3 g (04/05/23 1234)   DAPTOmycin (CUBICIN) 750 mg in sodium chloride 0.9 % IVPB 750 mg (04/04/23 1407)   magnesium sulfate bolus IVPB     PRN Meds:.sodium chloride, acetaminophen **OR** acetaminophen, acetaminophen, alum & mag hydroxide-simeth, bisacodyl, guaiFENesin-dextromethorphan, hydrALAZINE, labetalol, loperamide, magnesium citrate, magnesium sulfate bolus IVPB, metoprolol tartrate, metoprolol tartrate, morphine injection, ondansetron, oxyCODONE, phenol, polyethylene glycol, senna-docusate, simethicone, sodium chloride flush    Objective:   Vitals:   04/05/23 0300 04/05/23 0400 04/05/23 0920 04/05/23 1220  BP: (!) 147/90  134/86   Pulse: 94 94 77   Resp: 17 17 18 18   Temp: 98.2 F (36.8 C)  97.9 F (36.6 C) 98 F (36.7 C)  TempSrc: Oral  Oral Oral  SpO2:   99%   Weight:      Height:        Wt Readings from Last 3 Encounters:  03/28/23 (!) 146.6 kg  01/19/23 135.9 kg  09/08/22 133.1 kg     Intake/Output Summary (Last 24 hours) at 04/05/2023 1320 Last data filed at 04/04/2023 2300 Gross per 24 hour  Intake 480 ml  Output --  Net 480 ml     Physical Exam  Awake Alert, Oriented X 3, No new F.N deficits, Normal affect Symmetrical Chest wall movement, Good air movement bilaterally, CTAB RRR,No Gallops,Rubs or new Murmurs, No Parasternal Heave +ve B.Sounds, Abd Soft, No tenderness, No rebound - guarding or rigidity. Bilateral lower extremity edema R TMA under bandage - W.Vac, improving right lower extremity edema and swelling, right arm single-lumen PICC line    Data Review:    Recent Labs  Lab 03/30/23 0152 04/01/23 0338 04/05/23 0314  WBC 9.6 7.2 5.5  HGB  8.3* 8.0* 8.5*  HCT 26.5* 25.2* 27.7*  PLT 538* 541* 523*  MCV 84.9 86.6 87.9  MCH 26.6 27.5 27.0  MCHC 31.3 31.7 30.7  RDW 16.3* 16.8* 17.8*  LYMPHSABS 1.8 1.6  --   MONOABS 0.8 0.7  --   EOSABS 0.3 0.2  --   BASOSABS 0.0 0.0  --     Recent Labs  Lab 03/30/23 0152 04/01/23 0338 04/05/23 0314  NA 137 138 138  K 3.9 3.7 3.9  CL 109 107 110  CO2 22 23 23   ANIONGAP 6 8 5   GLUCOSE 140* 64* 142*  BUN 10 10 12   CREATININE 1.07 1.06 0.95  PROCALCITON <0.10  --   --   MG  --  1.5*  --   CALCIUM 8.2* 8.3* 8.5*     Recent Labs  Lab 03/30/23 0152 04/01/23 0338 04/05/23 0314  PROCALCITON <0.10  --   --   MG  --  1.5*  --   CALCIUM 8.2* 8.3* 8.5*    Micro Results No results found for this or any previous visit (from the  past 240 hour(s)).   Radiology Reports No results found.    Signature  -   Huey Bienenstock M.D on 04/05/2023 at 1:20 PM   -  To page go to [email or web address]

## 2023-04-05 NOTE — Progress Notes (Addendum)
Hypoglycemic Event  CBG: 45  Treatment: 120 ml orange juice  Symptoms: shaky  Follow-up CBG: Time:  1215 CBG Result 75  Possible Reasons for Event: unknown  Comments/MD notified:    Virl Cagey

## 2023-04-06 DIAGNOSIS — L089 Local infection of the skin and subcutaneous tissue, unspecified: Secondary | ICD-10-CM | POA: Diagnosis not present

## 2023-04-06 DIAGNOSIS — E11628 Type 2 diabetes mellitus with other skin complications: Secondary | ICD-10-CM | POA: Diagnosis not present

## 2023-04-06 LAB — CBC
HCT: 22.4 % — ABNORMAL LOW (ref 39.0–52.0)
Hemoglobin: 7 g/dL — ABNORMAL LOW (ref 13.0–17.0)
MCH: 26.7 pg (ref 26.0–34.0)
MCHC: 31.3 g/dL (ref 30.0–36.0)
MCV: 85.5 fL (ref 80.0–100.0)
Platelets: 406 10*3/uL — ABNORMAL HIGH (ref 150–400)
RBC: 2.62 MIL/uL — ABNORMAL LOW (ref 4.22–5.81)
RDW: 18 % — ABNORMAL HIGH (ref 11.5–15.5)
WBC: 4.3 10*3/uL (ref 4.0–10.5)
nRBC: 0 % (ref 0.0–0.2)

## 2023-04-06 LAB — BASIC METABOLIC PANEL
Anion gap: 9 (ref 5–15)
Anion gap: 13 (ref 5–15)
BUN: 13 mg/dL (ref 6–20)
BUN: 14 mg/dL (ref 6–20)
CO2: 23 mmol/L (ref 22–32)
CO2: 24 mmol/L (ref 22–32)
Calcium: 8.6 mg/dL — ABNORMAL LOW (ref 8.9–10.3)
Calcium: 8.8 mg/dL — ABNORMAL LOW (ref 8.9–10.3)
Chloride: 105 mmol/L (ref 98–111)
Chloride: 100 mmol/L (ref 98–111)
Creatinine, Ser: 0.97 mg/dL (ref 0.61–1.24)
Creatinine, Ser: 1.05 mg/dL (ref 0.61–1.24)
GFR, Estimated: >60 mL/min (ref >60)
GFR, Estimated: >60 mL/min (ref >60)
Glucose, Bld: 52 mg/dL — ABNORMAL LOW (ref 70–99)
Glucose, Bld: 209 mg/dL — ABNORMAL HIGH (ref 70–99)
Potassium: 3.6 mmol/L (ref 3.5–5.1)
Potassium: 4 mmol/L (ref 3.5–5.1)
Sodium: 137 mmol/L (ref 135–145)
Sodium: 137 mmol/L (ref 135–145)

## 2023-04-06 LAB — GLUCOSE, CAPILLARY
Glucose-Capillary: 42 mg/dL — CL (ref 70–99)
Glucose-Capillary: 74 mg/dL (ref 70–99)
Glucose-Capillary: 195 mg/dL — ABNORMAL HIGH (ref 70–99)
Glucose-Capillary: 47 mg/dL — ABNORMAL LOW (ref 70–99)
Glucose-Capillary: 54 mg/dL — ABNORMAL LOW (ref 70–99)
Glucose-Capillary: 60 mg/dL — ABNORMAL LOW (ref 70–99)
Glucose-Capillary: 129 mg/dL — ABNORMAL HIGH (ref 70–99)
Glucose-Capillary: 132 mg/dL — ABNORMAL HIGH (ref 70–99)
Glucose-Capillary: 201 mg/dL — ABNORMAL HIGH (ref 70–99)

## 2023-04-06 MED ORDER — INSULIN GLARGINE-YFGN 100 UNIT/ML ~~LOC~~ SOLN
10.0000 [IU] | Freq: Every day | SUBCUTANEOUS | Status: DC
  Administered 2023-04-07: 10 [IU] via SUBCUTANEOUS
  Filled 2023-04-06: qty 0.1

## 2023-04-06 MED ORDER — METOLAZONE 2.5 MG PO TABS
2.5000 mg | ORAL_TABLET | Freq: Once | ORAL | Status: AC
  Administered 2023-04-06: 2.5 mg via ORAL
  Filled 2023-04-06: qty 1

## 2023-04-06 MED ORDER — INSULIN ASPART 100 UNIT/ML IJ SOLN
0.0000 [IU] | Freq: Three times a day (TID) | INTRAMUSCULAR | Status: DC
  Administered 2023-04-06: 1 [IU] via SUBCUTANEOUS
  Administered 2023-04-07: 5 [IU] via SUBCUTANEOUS
  Administered 2023-04-07: 1 [IU] via SUBCUTANEOUS

## 2023-04-06 MED ORDER — POTASSIUM CHLORIDE CRYS ER 20 MEQ PO TBCR
40.0000 meq | EXTENDED_RELEASE_TABLET | Freq: Once | ORAL | Status: AC
  Administered 2023-04-06: 40 meq via ORAL
  Filled 2023-04-06: qty 2

## 2023-04-06 NOTE — Progress Notes (Signed)
PROGRESS NOTE                                                                                                                                                                                                             Patient Demographics:    Taylor Hughes, is a 52 y.o. male, DOB - [birth year], YNW:[id number]  Outpatient Primary MD for the patient is Hoy Register, MD    LOS - 14  Admit date - 03/22/2023    Chief Complaint  Patient presents with   Wound Infection   Hyperglycemia       Brief Narrative (HPI from H&P)     52 year old male with past medical history of T2DM, peripheral neuropathy, atrial fibrillation, HTN, and GERD. He presents with infection of his right lower extremity . Admitted with diabetic foot infection/osteo.  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   Subjective:   Patient in bed, appears comfortable, diarrhea has resolved   Assessment  & Plan :   Sepsis due to right diabetic foot ulcer present on admission.   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 -Antibiotics management per ID, plan to continue with IV antibiotics x 2 weeks until 8/27, then p.o. regimen doxycycline and Augmentin for total of 6 weeks from OR date 8/14. -Lab greatly appreciated, wound VAC has been discontinued, recommendation for dry dressing to be changed as needed at home   Intermittent SVT and sinus tachycardia.   - Due to #1 above.  Echocardiogram noted with preserved left sided function, stable TSH, CTA negative for PE,  after amputation and supportive care for infection tachycardia much improved.  Right lower extremity edema.   - Present for the last month secondary to the infection, lower extremity venous duplex although limited but negative.  According to the patient he thinks now that his right leg has a propensity to swell from time to time, says his primary MD thinks he has chronic lymphedema.  Will defer chronic diagnosis to PCP. -Started on IV Lasix, edema is improving.  Will  give 1 dose of metolazone today.  Essential hypertension.  -Blood pressure low initially, requiring midodrine, now started to increase, it has been discontinued.   AKI.  Due to sepsis.  Hydrate and monitor hold nephrotoxins, stable renal ultrasound, improving, stop IV fluids and monitor.  Avoid nephrotoxins.  Anemia.  Combination of heme dilution, sepsis and iron deficiency on anemia panel, placed on oral iron and folic acid, type screen done, giving 1 unit of packed RBC on 03/29/2023, no signs of ongoing active brisk bleeding.  Check CBC again in the morning.  Dyslipidemia.  On statin.  Vomiting due to gastroparesis.  Much improved with supportive care.  Morbid obesity.  BMI 41.  Follow-up with PCP for weight loss.  DM type II.  Poorly  controlled, with hyperglycemia, with A1c of 9.8.   -Started on insulin, dose has been required to be lower multiple times due to to low CBG readings.   Lab Results  Component Value Date   HGBA1C 9.8 (H) 03/23/2023   CBG (last 3)  Recent Labs    04/06/23 1230 04/06/23 1318 04/06/23 1348  GLUCAP 54* 60* 129*         Condition - Fair  Family Communication  :  None  Code Status :  Full  Consults  :  Cards, Ortho, ID  PUD Prophylaxis : PPI   Procedures  :     Right arm PICC line -  03/30/2023.    Right foot transmetatarsal amputation by Dr. [name] on 03/25/2023.  Echocardiogram.  1. Left ventricular ejection fraction, by estimation, is 70 to 75%. The left ventricle has  hyperdynamic function. The left ventricle has no regional wall motion abnormalities. Left ventricular diastolic parameters are indeterminate.  2. Right ventricular systolic function is hyperdynamic. The right ventricular size is severely enlarged.  3. The mitral valve was not well visualized. No evidence of mitral valve regurgitation. No evidence of mitral stenosis.  4. The aortic valve was not well visualized. Aortic valve regurgitation is not visualized.  5. The inferior vena cava is normal in size with greater than 50% respiratory variability, suggesting right atrial pressure of 3 mmHg.    CTA chest. 1. No CT evidence of pulmonary embolism. 2. Small bilateral pleural effusions with minimal compressive atelectasis of the lung bases. Pneumonia is not excluded.  Renal ultrasound.  Nonacute.    Lower extremity venous duplex.  No DVT.    ABI - Right: Resting right ankle-brachial index indicates mild right lower extremity arterial disease. Left: Resting left ankle-brachial index is within normal range. *See table(s) above for measurements and observations.      Disposition Plan  : Plan to go home after finishing IV antibiotics.  Status is: Inpatient   DVT Prophylaxis  :    SCD's Start: 03/25/23 1501 heparin injection 5,000 Units Start: 03/23/23 1800    Lab Results  Component Value Date   PLT 406 (H) 04/06/2023    Diet :  Diet Order             Diet Carb Modified Fluid consistency: Thin; Room service appropriate? Yes  Diet effective now                    Inpatient Medications  Scheduled Meds:  acidophilus  2 capsule Oral Daily   vitamin C  1,000 mg Oral Daily   aspirin EC  81 mg Oral Daily   atorvastatin  20 mg Oral Daily   buPROPion  150 mg Oral Daily  Chlorhexidine Gluconate Cloth  6 each Topical Daily   docusate sodium  200 mg Oral BID   ferrous sulfate  325 mg Oral BID WC   folic acid  1 mg Oral Daily   furosemide  40 mg Intravenous Daily   gabapentin  300 mg  Oral BID   heparin injection (subcutaneous)  5,000 Units Subcutaneous Q8H   insulin aspart  0-9 Units Subcutaneous TID WC   [START ON 04/07/2023] insulin glargine-yfgn  10 Units Subcutaneous Daily   metoCLOPramide  10 mg Oral TID AC & HS   metoprolol tartrate  25 mg Oral BID   nutrition supplement (JUVEN)  1 packet Oral BID BM   pantoprazole  40 mg Oral Daily   zinc sulfate  220 mg Oral Daily   Continuous Infusions:  sodium chloride Stopped (03/28/23 1533)   ampicillin-sulbactam (UNASYN) IV 3 g (04/06/23 1111)   magnesium sulfate bolus IVPB     PRN Meds:.sodium chloride, acetaminophen **OR** acetaminophen, acetaminophen, alum & mag hydroxide-simeth, bisacodyl, guaiFENesin-dextromethorphan, hydrALAZINE, labetalol, loperamide, magnesium citrate, magnesium sulfate bolus IVPB, metoprolol tartrate, metoprolol tartrate, morphine injection, ondansetron, oxyCODONE, phenol, polyethylene glycol, senna-docusate, simethicone, sodium chloride flush    Objective:   Vitals:   04/06/23 0051 04/06/23 0425 04/06/23 0838 04/06/23 1230  BP: 128/69 (!) 167/94    Pulse:  77    Resp: 18 17  18   Temp: 98 F (36.7 C) 98.2 F (36.8 C)    TempSrc: Oral Oral Oral   SpO2: 96% 97% 93%   Weight:      Height:        Wt Readings from Last 3 Encounters:  03/28/23 (!) 146.6 kg  01/19/23 135.9 kg  09/08/22 133.1 kg     Intake/Output Summary (Last 24 hours) at 04/06/2023 1511 Last data filed at 04/06/2023 1200 Gross per 24 hour  Intake 1705.03 ml  Output 2300 ml  Net -594.97 ml     Physical Exam  Awake Alert, Oriented X 3, No new F.N deficits, Normal affect Symmetrical Chest wall movement, Good air movement bilaterally, CTAB RRR,No Gallops,Rubs or new Murmurs, No Parasternal Heave +ve B.Sounds, Abd Soft, No tenderness, No rebound - guarding or rigidity. Bilateral lower extremity edema R TMA under bandage - W.Vac, improving right lower extremity edema and swelling, right arm single-lumen PICC  line    Data Review:    Recent Labs  Lab 04/01/23 0338 04/05/23 0314 04/06/23 0338  WBC 7.2 5.5 4.3  HGB 8.0* 8.5* 7.0*  HCT 25.2* 27.7* 22.4*  PLT 541* 523* 406*  MCV 86.6 87.9 85.5  MCH 27.5 27.0 26.7  MCHC 31.7 30.7 31.3  RDW 16.8* 17.8* 18.0*  LYMPHSABS 1.6  --   --   MONOABS 0.7  --   --   EOSABS 0.2  --   --   BASOSABS 0.0  --   --     Recent Labs  Lab 04/01/23 0338 04/05/23 0314 04/06/23 0338  NA 138 138 137  K 3.7 3.9 3.6  CL 107 110 105  CO2 23 23 23   ANIONGAP 8 5 9   GLUCOSE 64* 142* 52*  BUN 10 12 13   CREATININE 1.06 0.95 0.97  MG 1.5*  --   --   CALCIUM 8.3* 8.5* 8.6*     Recent Labs  Lab 04/01/23 0338 04/05/23 0314 04/06/23 0338  MG 1.5*  --   --   CALCIUM 8.3* 8.5* 8.6*    Micro Results No results found for this or any previous  visit (from the past 240 hour(s)).   Radiology Reports No results found.    Signature  -   Huey Bienenstock M.D on 04/06/2023 at 3:11 PM   -  To page go to [email or web address]

## 2023-04-06 NOTE — Inpatient Diabetes Management (Signed)
Inpatient Diabetes Program Recommendations  AACE/ADA: New Consensus Statement on Inpatient Glycemic Control (2015)  Target Ranges:  Prepandial:   less than 140 mg/dL      Peak postprandial:   less than 180 mg/dL (1-2 hours)      Critically ill patients:  140 - 180 mg/dL   Lab Results  Component Value Date   GLUCAP 195 (H) 04/06/2023   HGBA1C 9.8 (H) 03/23/2023    Diabetes history:  DM 2 Outpatient Diabetes medications:  Basaglar 30 units daily Humalog 0-12 units tid with meals Ozempic- NOT TAKING Current orders for Inpatient glycemic control:  Novolog 0-15 units tid with meals and HS 0-5 Semglee 14 units qday  Inpatient Diabetes Program Recommendations:   Noted decrease in Semglee insulin due to hypoglycemia. May also consider: -Decrease Novolog correction to 0-9 units tid, 0-5 units hs  Thank you, Darel Hong E. Tierrah Anastos, RN, MSN, CDE  Diabetes Coordinator Inpatient Glycemic Control Team Team Pager [phone] (8am-5pm) 04/06/2023 12:39 PM

## 2023-04-06 NOTE — Progress Notes (Signed)
Patient ID: Taylor Hughes, male   DOB: 1971/07/21, 52 y.o.   MRN: [id number] I came by the the bedside just now to check on [name] patient Taylor Hughes.  He is getting close to 2 weeks out from a right foot transmetatarsal amputation.  He has had an incisional VAC over his right foot since the time of surgery.  He is scheduled to be discharged tomorrow I believe to skilled nursing.  I did remove the VAC from his right foot since it has been on for getting close to 2 weeks.  The sutures are intact.  There is appropriate bloody drainage so I did place a bulky compressive dressing around his foot.  I will come back at the end of the day today or first thing tomorrow morning to change that.  He does not need to be discharged with the [facility] on his foot.  He will just need dry dressings to be changed as needed until follow-up appoint with Dr. [name] in 1 to 2 weeks.

## 2023-04-06 NOTE — Progress Notes (Addendum)
Physical Therapy Treatment and Discharge Patient Details Name: Thad Derick MRN: [id number] DOB: 08-28-1970 Today's Date: 04/06/2023   History of Present Illness 52 year old male presents 03/23/23 with infection of his right lower extremity. 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    PT Comments  Patient now reports he will be discharging home tomorrow on oral antibiotics. Patient asking if he will go home with home VAC unit. Unclear if that is the plan. Will ask hospitalist if he can get clarification. From PT perspective recommend RW and HHPT (home safety evaluation).  Acute PT goals met and pt is discharged from acute PT.    If plan is discharge home, recommend the following: Assistance with cooking/housework;Help with stairs or ramp for entrance   Can travel by private vehicle        Equipment Recommendations  Rolling walker (2 wheels)    Recommendations for Other Services       Precautions / Restrictions Precautions Precautions: Fall Precaution Comments: R post-op shoe Restrictions Weight Bearing Restrictions: Yes RLE Weight Bearing: Non weight bearing Other Position/Activity Restrictions: MD prefers NWB but able to put weight on it for ambulation with post op shoe per ortho note     Mobility  Bed Mobility Overal bed mobility: Modified Independent Bed Mobility: Supine to Sit     Supine to sit: Modified independent (Device/Increase time), HOB elevated     General bed mobility comments: able to perform without assistance    Transfers Overall transfer level: Needs assistance Equipment used: Rolling walker (2 wheels) Transfers: Sit to/from Stand Sit to Stand: Modified independent (Device/Increase time)                Ambulation/Gait Ambulation/Gait assistance: Modified independent (Device/Increase time) Gait Distance (Feet): 170 Feet Assistive device: Rolling walker (2  wheels) Gait Pattern/deviations: Step-to pattern, Decreased stride length           Stairs             Wheelchair Mobility     Tilt Bed    Modified Rankin (Stroke Patients Only)       Balance Overall balance assessment: Needs assistance Sitting-balance support: No upper extremity supported, Feet supported Sitting balance-Leahy Scale: Good     Standing balance support: During functional activity, No upper extremity supported Standing balance-Leahy Scale: Good Standing balance comment: able to stand without UE support                            Cognition Arousal: Alert Behavior During Therapy: WFL for tasks assessed/performed, Anxious Overall Cognitive Status: Within Functional Limits for tasks assessed                                 General Comments: alert and oriented, aware of WB precautions and need for post op shoe        Exercises      General Comments General comments (skin integrity, edema, etc.): VSS on RA      Pertinent Vitals/Pain Pain Assessment Pain Assessment: No/denies pain    Home Living                          Prior Function            PT Goals (current goals can now be found in the care plan  section) Acute Rehab PT Goals Patient Stated Goal: be able to walk to the bathroom on his own Time For Goal Achievement: 04/11/23 Potential to Achieve Goals: Good Progress towards PT goals: Progressing toward goals    Frequency    Min 1X/week      PT Plan      Co-evaluation              AM-PAC PT "6 Clicks" Mobility   Outcome Measure  Help needed turning from your back to your side while in a flat bed without using bedrails?: None Help needed moving from lying on your back to sitting on the side of a flat bed without using bedrails?: None Help needed moving to and from a bed to a chair (including a wheelchair)?: None Help needed standing up from a chair using your arms (e.g.,  wheelchair or bedside chair)?: None Help needed to walk in hospital room?: None Help needed climbing 3-5 steps with a railing? : A Little 6 Click Score: 23    End of Session   Activity Tolerance: Patient tolerated treatment well Patient left: with call bell/phone within reach;in chair   PT Visit Diagnosis: Difficulty in walking, not elsewhere classified (R26.2)   PT Discharge Note  Patient is being discharged from PT services secondary to:  Goals met and no further therapy needs identified.  Please see latest Therapy Progress Note for current level of functioning and progress toward goals.  Progress and discharge plan and discussed with patient/caregiver and they  Agree    Time: 1137-1201 PT Time Calculation (min) (ACUTE ONLY): 24 min  Charges:    $Gait Training: 23-37 mins PT General Charges $$ ACUTE PT VISIT: 1 Visit                      Jerolyn Center, PT Acute Rehabilitation Services  Office [phone]    Zena Amos 04/06/2023, 12:06 PM

## 2023-04-06 NOTE — TOC Progression Note (Signed)
Transition of Care [facility]) - Progression Note    Patient Details  Name: Taylor Hughes MRN: [id number] Date of Birth: November 12, 1970  Transition of [facility] CM/SW Contact  Gordy Clement, RN Phone Number: 04/06/2023, 4:16 PM  Clinical Narrative:     Patient should dc to home tomorrow after IVABX.  HH PT and OT we recommended but insurance is prohibitive for accepting agencies.  Patient has now been referred to OP therapies.  A RW and BSC have been ordered from [employer] and will be delivered bedside prior to DC.   TOC will continue to follow patient for any additional discharge needs          Barriers to Discharge: Inadequate or no insurance, English as a second language teacher, Continued Medical Work up, SNF Pending bed offer  Expected Discharge Plan and Services In-house Referral: Clinical Social Work, Artist     Living arrangements for the past 2 months: Single Family Home                                       Social Determinants of Health (SDOH) Interventions SDOH Screenings   Food Insecurity: No Food Insecurity (03/23/2023)  Housing: Low Risk  (03/23/2023)  Transportation Needs: No Transportation Needs (03/23/2023)  Utilities: Not At Risk (03/23/2023)  Depression (PHQ2-9): Low Risk  (01/19/2023)  Tobacco Use: Low Risk  (03/25/2023)    Readmission Risk Interventions    03/24/2023    4:56 PM 08/15/2022    1:23 PM  Readmission Risk Prevention Plan  Transportation Screening Complete Complete  PCP or Specialist Appt within 5-7 Days  Complete  PCP or Specialist Appt within 3-5 Days Complete   Home Care Screening  Complete  Medication Review (RN CM)  Complete  HRI or Home Care Consult Complete   Social Work Consult for Recovery Care Planning/Counseling Complete   Palliative Care Screening Not Applicable   Medication Review Oceanographer) Referral to Pharmacy

## 2023-04-06 NOTE — Progress Notes (Signed)
Occupational Therapy Treatment Patient Details Name: Taylor Hughes MRN: [id number] DOB: 1970/09/28 Today's Date: 04/06/2023   History of present illness 52 year old male presents 03/23/23 with infection of his right lower extremity. 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   OT comments  Patient continues to be Mod I for bed mobility and supervision for sit to stands, mobility and transfers with RW. Patient eager to return home with HHOT. Acute OT to continue to follow.       If plan is discharge home, recommend the following:  A little help with walking and/or transfers;A little help with bathing/dressing/bathroom;Assistance with cooking/housework;Assist for transportation;Help with stairs or ramp for entrance   Equipment Recommendations  Tub/shower bench;BSC/3in1    Recommendations for Other Services      Precautions / Restrictions Precautions Precautions: Fall Precaution Comments: R post-op shoe Restrictions Weight Bearing Restrictions: Yes RLE Weight Bearing: Non weight bearing Other Position/Activity Restrictions: MD prefers NWB but able to put weight on it for ambulation with post op shoe per ortho note       Mobility Bed Mobility Overal bed mobility: Modified Independent Bed Mobility: Supine to Sit           General bed mobility comments: able to perform without assistance    Transfers Overall transfer level: Needs assistance Equipment used: Rolling walker (2 wheels) Transfers: Sit to/from Stand Sit to Stand: Modified independent (Device/Increase time)           General transfer comment: supervisoin for safety with walker use     Balance Overall balance assessment: Needs assistance Sitting-balance support: No upper extremity supported, Feet supported Sitting balance-Leahy Scale: Good Sitting balance - Comments: donned shoes seated on EOB   Standing balance support:  During functional activity, No upper extremity supported Standing balance-Leahy Scale: Good Standing balance comment: able to stand without UE support                           ADL either performed or assessed with clinical judgement   ADL Overall ADL's : Needs assistance/impaired     Grooming: Wash/dry hands;Wash/dry face;Supervision/safety;Standing               Lower Body Dressing: Set up;Sit to/from stand Lower Body Dressing Details (indicate cue type and reason): able to donn post-op shoe and slip on shoe Toilet Transfer: Supervision/safety;Ambulation;Regular Toilet;Rolling walker (2 wheels) Toilet Transfer Details (indicate cue type and reason): simulated                Extremity/Trunk Assessment              Vision       Perception     Praxis      Cognition Arousal: Alert Behavior During Therapy: WFL for tasks assessed/performed, Anxious Overall Cognitive Status: Within Functional Limits for tasks assessed                                 General Comments: alert and oriented, aware of WB precautions and need for post op shoe        Exercises      Shoulder Instructions       General Comments      Pertinent Vitals/ Pain       Pain Assessment Pain Assessment: Faces Faces Pain Scale: Hurts a little bit Pain Location: R foot Pain Descriptors /  Indicators: Aching, Operative site guarding Pain Intervention(s): Monitored during session, Repositioned  Home Living                                          Prior Functioning/Environment              Frequency  Min 1X/week        Progress Toward Goals  OT Goals(current goals can now be found in the care plan section)  Progress towards OT goals: Progressing toward goals  Acute Rehab OT Goals Patient Stated Goal: go home OT Goal Formulation: With patient Time For Goal Achievement: 04/09/23 Potential to Achieve Goals: Good ADL Goals Pt  Will Perform Lower Body Dressing: with supervision;sit to/from stand Pt Will Transfer to Toilet: with supervision;ambulating Pt Will Perform Toileting - Clothing Manipulation and hygiene: with modified independence;sitting/lateral leans Pt Will Perform Tub/Shower Transfer: with supervision;tub bench;ambulating  Plan      Co-evaluation                 AM-PAC OT "6 Clicks" Daily Activity     Outcome Measure   Help from another person eating meals?: None Help from another person taking care of personal grooming?: None Help from another person toileting, which includes using toliet, bedpan, or urinal?: A Little Help from another person bathing (including washing, rinsing, drying)?: A Little Help from another person to put on and taking off regular upper body clothing?: None Help from another person to put on and taking off regular lower body clothing?: A Little 6 Click Score: 21    End of Session Equipment Utilized During Treatment: Rolling walker (2 wheels)  OT Visit Diagnosis: Unsteadiness on feet (R26.81);Other abnormalities of gait and mobility (R26.89) Pain - Right/Left: Right Pain - part of body: Ankle and joints of foot   Activity Tolerance Patient tolerated treatment well   Patient Left in chair;with call bell/phone within reach   Nurse Communication Mobility status        Time: 1610-9604 OT Time Calculation (min): 20 min  Charges: OT General Charges $OT Visit: 1 Visit OT Treatments $Self Care/Home Management : 8-22 mins  Alfonse Flavors, OTA Acute Rehabilitation Services  Office [phone]   Dewain Penning 04/06/2023, 10:11 AM

## 2023-04-07 ENCOUNTER — Other Ambulatory Visit (HOSPITAL_COMMUNITY): Payer: Self-pay

## 2023-04-07 DIAGNOSIS — Z794 Long term (current) use of insulin: Secondary | ICD-10-CM | POA: Diagnosis not present

## 2023-04-07 DIAGNOSIS — L089 Local infection of the skin and subcutaneous tissue, unspecified: Secondary | ICD-10-CM | POA: Diagnosis not present

## 2023-04-07 DIAGNOSIS — E11628 Type 2 diabetes mellitus with other skin complications: Secondary | ICD-10-CM | POA: Diagnosis not present

## 2023-04-07 LAB — BASIC METABOLIC PANEL
Anion gap: 7 (ref 5–15)
BUN: 15 mg/dL (ref 6–20)
CO2: 24 mmol/L (ref 22–32)
Calcium: 8.7 mg/dL — ABNORMAL LOW (ref 8.9–10.3)
Chloride: 105 mmol/L (ref 98–111)
Creatinine, Ser: 1.01 mg/dL (ref 0.61–1.24)
GFR, Estimated: >60 mL/min (ref >60)
Glucose, Bld: 144 mg/dL — ABNORMAL HIGH (ref 70–99)
Potassium: 3.9 mmol/L (ref 3.5–5.1)
Sodium: 136 mmol/L (ref 135–145)

## 2023-04-07 LAB — CBC
HCT: 26.6 % — ABNORMAL LOW (ref 39.0–52.0)
Hemoglobin: 8.2 g/dL — ABNORMAL LOW (ref 13.0–17.0)
MCH: 26.7 pg (ref 26.0–34.0)
MCHC: 30.8 g/dL (ref 30.0–36.0)
MCV: 86.6 fL (ref 80.0–100.0)
Platelets: 453 10*3/uL — ABNORMAL HIGH (ref 150–400)
RBC: 3.07 MIL/uL — ABNORMAL LOW (ref 4.22–5.81)
RDW: 18.2 % — ABNORMAL HIGH (ref 11.5–15.5)
WBC: 4.8 10*3/uL (ref 4.0–10.5)
nRBC: 0 % (ref 0.0–0.2)

## 2023-04-07 LAB — GLUCOSE, CAPILLARY
Glucose-Capillary: 126 mg/dL — ABNORMAL HIGH (ref 70–99)
Glucose-Capillary: 283 mg/dL — ABNORMAL HIGH (ref 70–99)

## 2023-04-07 MED ORDER — OXYCODONE HCL 5 MG PO TABS
5.0000 mg | ORAL_TABLET | Freq: Four times a day (QID) | ORAL | 0 refills | Status: DC | PRN
  Filled 2023-04-07: qty 20, 5d supply, fill #0

## 2023-04-07 MED ORDER — ASPIRIN EC 81 MG PO TBEC: 81.0000 mg | DELAYED_RELEASE_TABLET | Freq: Every day | ORAL | Status: DC

## 2023-04-07 MED ORDER — ACIDOPHILUS PO CAPS
2.0000 | ORAL_CAPSULE | Freq: Every day | ORAL | 1 refills | Status: DC
  Filled 2023-04-07: qty 60, 30d supply, fill #0
  Filled 2023-06-24 – 2023-07-13 (×4): qty 60, 30d supply, fill #1

## 2023-04-07 MED ORDER — FUROSEMIDE 20 MG PO TABS
ORAL_TABLET | ORAL | 0 refills | Status: AC
Start: 2023-04-07 — End: 2023-05-18
  Filled 2023-04-07: qty 90, fill #0
  Filled 2023-04-07 – 2023-04-09 (×2): qty 90, 42d supply, fill #0

## 2023-04-07 MED ORDER — BUPROPION HCL ER (XL) 150 MG PO TB24
150.0000 mg | ORAL_TABLET | Freq: Every day | ORAL | 1 refills | Status: DC
Start: 2023-04-07 — End: 2023-11-10
  Filled 2023-04-07: qty 30, 30d supply, fill #0
  Filled 2023-05-14: qty 30, 30d supply, fill #1

## 2023-04-07 MED ORDER — AMOXICILLIN 500 MG PO CAPS
1000.0000 mg | ORAL_CAPSULE | Freq: Three times a day (TID) | ORAL | 0 refills | Status: AC
  Filled 2023-04-07: qty 170, 29d supply, fill #0

## 2023-04-07 MED ORDER — METRONIDAZOLE 500 MG PO TABS
500.0000 mg | ORAL_TABLET | Freq: Three times a day (TID) | ORAL | 0 refills | Status: AC
  Filled 2023-04-07: qty 85, 29d supply, fill #0

## 2023-04-07 MED ORDER — METOPROLOL TARTRATE 25 MG PO TABS
25.0000 mg | ORAL_TABLET | Freq: Two times a day (BID) | ORAL | 1 refills | Status: DC
  Filled 2023-04-07: qty 60, 30d supply, fill #0
  Filled 2023-04-17: qty 60, 30d supply, fill #1

## 2023-04-07 MED ORDER — FOLIC ACID 1 MG PO TABS
1.0000 mg | ORAL_TABLET | Freq: Every day | ORAL | 1 refills | Status: DC
  Filled 2023-04-07: qty 30, 30d supply, fill #0
  Filled 2023-06-24: qty 30, 30d supply, fill #1

## 2023-04-07 MED ORDER — BASAGLAR KWIKPEN 100 UNIT/ML ~~LOC~~ SOPN: 10.0000 [IU] | PEN_INJECTOR | Freq: Every day | SUBCUTANEOUS | Status: DC

## 2023-04-07 NOTE — TOC Transition Note (Signed)
Transition of Care [facility]) - CM/SW Discharge Note   Patient Details  Name: Taylor Hughes MRN: [id number] Date of Birth: [birth year]  Transition of [facility]) CM/SW Contact:  Kermit Balo, RN Phone Number: 04/07/2023, 11:32 AM   Clinical Narrative:     Pt is discharging home with outpatient therapy through [facility]. Information on the AVS for pt to call and schedule his first appointment. DME delivered to the room per [employer]. Pt has transportation home.  Final next level of care: OP Rehab Barriers to Discharge: No Barriers Identified   Patient Goals and CMS Choice CMS Medicare.gov Compare Post Acute Care list provided to:: Patient Choice offered to / list presented to : Patient  Discharge Placement                         Discharge Plan and Services Additional resources added to the After Visit Summary for   In-house Referral: Clinical Social Work, Artist              DME Arranged: Dan Humphreys rolling, Bedside commode DME Agency: [employer] Date DME Agency Contacted: 04/06/23                Social Determinants of Health (SDOH) Interventions SDOH Screenings   Food Insecurity: No Food Insecurity (03/23/2023)  Housing: Low Risk  (03/23/2023)  Transportation Needs: No Transportation Needs (03/23/2023)  Utilities: Not At Risk (03/23/2023)  Depression (PHQ2-9): Low Risk  (01/19/2023)  Tobacco Use: Low Risk  (03/25/2023)     Readmission Risk Interventions    03/24/2023    4:56 PM 08/15/2022    1:23 PM  Readmission Risk Prevention Plan  Transportation Screening Complete Complete  PCP or Specialist Appt within 5-7 Days  Complete  PCP or Specialist Appt within 3-5 Days Complete   Home Care Screening  Complete  Medication Review (RN CM)  Complete  HRI or Home Care Consult Complete   Social Work Consult for Recovery Care Planning/Counseling Complete   Palliative Care Screening Not Applicable   Medication Review Oceanographer) Referral  to Pharmacy

## 2023-04-07 NOTE — Plan of Care (Signed)
  Problem: Education: Goal: Ability to describe self-care measures that may prevent or decrease complications (Diabetes Survival Skills Education) will improve Outcome: Completed/Met Goal: Individualized Educational Video(s) Outcome: Completed/Met   Problem: Coping: Goal: Ability to adjust to condition or change in health will improve Outcome: Completed/Met   Problem: Fluid Volume: Goal: Ability to maintain a balanced intake and output will improve Outcome: Completed/Met   Problem: Health Behavior/Discharge Planning: Goal: Ability to identify and utilize available resources and services will improve Outcome: Completed/Met Goal: Ability to manage health-related needs will improve Outcome: Completed/Met   Problem: Metabolic: Goal: Ability to maintain appropriate glucose levels will improve Outcome: Completed/Met   Problem: Nutritional: Goal: Maintenance of adequate nutrition will improve Outcome: Completed/Met Goal: Progress toward achieving an optimal weight will improve Outcome: Completed/Met   Problem: Skin Integrity: Goal: Risk for impaired skin integrity will decrease Outcome: Completed/Met   Problem: Tissue Perfusion: Goal: Adequacy of tissue perfusion will improve Outcome: Completed/Met   Problem: Education: Goal: Knowledge of General Education information will improve Description: Including pain rating scale, medication(s)/side effects and non-pharmacologic comfort measures Outcome: Completed/Met   Problem: Health Behavior/Discharge Planning: Goal: Ability to manage health-related needs will improve Outcome: Completed/Met   Problem: Clinical Measurements: Goal: Ability to maintain clinical measurements within normal limits will improve Outcome: Completed/Met Goal: Will remain free from infection Outcome: Completed/Met Goal: Diagnostic test results will improve Outcome: Completed/Met Goal: Respiratory complications will improve Outcome: Completed/Met Goal:  Cardiovascular complication will be avoided Outcome: Completed/Met   Problem: Activity: Goal: Risk for activity intolerance will decrease Outcome: Completed/Met   Problem: Nutrition: Goal: Adequate nutrition will be maintained Outcome: Completed/Met   Problem: Coping: Goal: Level of anxiety will decrease Outcome: Completed/Met   Problem: Elimination: Goal: Will not experience complications related to bowel motility Outcome: Completed/Met Goal: Will not experience complications related to urinary retention Outcome: Completed/Met   Problem: Pain Managment: Goal: General experience of comfort will improve Outcome: Completed/Met   Problem: Safety: Goal: Ability to remain free from injury will improve Outcome: Completed/Met   Problem: Skin Integrity: Goal: Risk for impaired skin integrity will decrease Outcome: Completed/Met   Problem: Education: Goal: Knowledge of the prescribed therapeutic regimen will improve Outcome: Completed/Met Goal: Ability to verbalize activity precautions or restrictions will improve Outcome: Completed/Met Goal: Understanding of discharge needs will improve Outcome: Completed/Met   Problem: Activity: Goal: Ability to perform//tolerate increased activity and mobilize with assistive devices will improve Outcome: Completed/Met   Problem: Clinical Measurements: Goal: Postoperative complications will be avoided or minimized Outcome: Completed/Met   Problem: Self-Care: Goal: Ability to meet self-care needs will improve Outcome: Completed/Met   Problem: Self-Concept: Goal: Ability to maintain and perform role responsibilities to the fullest extent possible will improve Outcome: Completed/Met   Problem: Pain Management: Goal: Pain level will decrease with appropriate interventions Outcome: Completed/Met   Problem: Skin Integrity: Goal: Demonstration of wound healing without infection will improve Outcome: Completed/Met

## 2023-04-07 NOTE — Progress Notes (Signed)
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  The patient has received 2 weeks of intravenous antibiotics from last OR 8/14. Planning discharge today on high dose Amoxicillin 1g po 3x/day and Metronidazole 500 mg po 3x/day for an additional 4 weeks, then will be seen at the ID clinic and continued on Amoxicillin to cover the schaalia organism longer. Antibiotics filled at [facility] TOC and delivered to bedside. Patient educated on the antibiotics and compliance stressed.   Thank you for allowing pharmacy to be a part of this patient's care.  Georgina Pillion, PharmD, BCPS, BCIDP Infectious Diseases Clinical Pharmacist 04/07/2023 12:17 PM   **Pharmacist phone directory can now be found on [email or web address] ([email or web address]).  Listed under [facility].

## 2023-04-07 NOTE — Plan of Care (Signed)

## 2023-04-07 NOTE — Discharge Summary (Signed)
Physician Discharge Summary  Andruw Rafferty EXB:[id number] DOB: 1971-05-06 DOA: 03/22/2023  PCP: Hoy Register, MD  Admit date: 03/22/2023 Discharge date: 04/07/2023  Admitted From: (Home) Disposition:  (Home )  Recommendations for Outpatient Follow-up:  Follow up with PCP in 1-2 weeks Please obtain BMP/CBC in one week To follow with Dr. [name] next week  Diet recommendation: Heart Healthy / Carb Modified  Brief/Interim Summary:  52 year old male with past medical history of T2DM, peripheral neuropathy, atrial fibrillation, HTN, and GERD. He presents with infection of his right lower extremity . Admitted with diabetic foot infection/osteo.  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   Sepsis due to right diabetic foot ulcer osteomyelitis, abscess, present on admission.   -Sepsis present on admission, pathophysiology of sepsis has resolved.  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 plan of care for an additional 4 works of antibiotics with amoxicillin 1 gram tid and metronidazole (EOT 9/25) and then continued treatment for Schallia ondontolyticus.  To be followed by ID as an outpatient regarding dental treatment. -Post operative wound care per orthopedic, wound VAC discontinued yesterday, recommendation to continue with current dressing until he is seen by Dr. [name] next week.    Intermittent SVT and sinus tachycardia.   - Due to above.  Echocardiogram noted with preserved left sided function, stable TSH, CTA negative for PE, after amputation and supportive care for infection tachycardia much improved.   Right lower  extremity edema.   -Continue diuresis   Essential hypertension.  -Blood pressure low initially, requiring midodrine, but it has been discontinued as blood pressure has normalized  AKI.  Due to sepsis.  Resolved   Anemia of chronic disease -  combination of heme dilution, sepsis and iron deficiency on anemia panel, placed on oral iron and folic acid, type screen done, giving 1 unit of packed RBC on 03/29/2023, no signs of ongoing active brisk bleeding.     Dyslipidemia.  On statin.   Vomiting due to gastroparesis.  Much improved with supportive care.   Morbid obesity.  BMI 41.  Follow-up with PCP for weight loss.   DM type II.  Poorly  controlled, with hyperglycemia, with A1c of 9.8.   -Patient on 30 units of Lantus at home, he had recurrent hypoglycemic episode on the lower dose, so I discussed with him his high insulin requirement at home likely due to diet noncompliance, he will be discharged on 10 units       Discharge Diagnoses:  Principal Problem:   Diabetic foot infection (HCC) Active Problems:   Essential hypertension   Type 2 diabetes mellitus with other specified complication (HCC)   PAF (paroxysmal atrial fibrillation) (HCC)   Osteomyelitis (HCC)   HLD (hyperlipidemia)   Gastroparesis   Severe protein-calorie malnutrition (HCC)   Lymphedema   Abscess of right foot   Sinus tachycardia   Right ventricular enlargement    Discharge Instructions  Discharge Instructions     Ambulatory referral to Occupational Therapy   Complete by: As directed    Ambulatory referral to Physical Therapy   Complete by: As directed    Diet - low sodium heart healthy   Complete by: As directed    Discharge wound care:   Complete by:  As directed    Leave current dressing applied on the right foot until you see Dr. [name] next week.   Increase activity slowly   Complete by: As directed    Negative Pressure Wound Therapy - Incisional   Complete by: As directed    Attached the wound  VAC dressing to the Praveena plus portable wound VAC pump at discharge.  If the pump is not in the room then a pump can be ordered from central supply or the operating room.  Show patient how to plug this in to keep it charged.      Allergies as of 04/07/2023   No Known Allergies      Medication List     STOP taking these medications    Semaglutide (1 MG/DOSE) 4 MG/3ML Sopn       TAKE these medications    Acidophilus Caps capsule Take 2 capsules by mouth daily. Start taking on: April 08, 2023   albuterol 108 (90 Base) MCG/ACT inhaler Commonly known as: VENTOLIN HFA Inhale 2 puffs into the lungs every 6 (six) hours as needed for wheezing or shortness of breath.   amoxicillin 500 MG capsule Commonly known as: AMOXIL Take 2 capsules (1,000 mg total) by mouth 3 (three) times daily for 85 doses.   aspirin EC 81 MG tablet Take 1 tablet (81 mg total) by mouth daily. Swallow whole.   atorvastatin 20 MG tablet Commonly known as: LIPITOR Take 1 tablet (20 mg total) by mouth daily.   Basaglar KwikPen 100 UNIT/ML Inject 10 Units into the skin daily. What changed: how much to take   buPROPion 150 MG 24 hr tablet Commonly known as: Wellbutrin XL Take 1 tablet (150 mg total) by mouth daily.   folic acid 1 MG tablet Commonly known as: FOLVITE Take 1 tablet (1 mg total) by mouth daily. Start taking on: April 08, 2023   furosemide 20 MG tablet Commonly known as: LASIX Take 3 tablets (60 mg total) by mouth daily for 5 days, THEN continue 2 tablets (40 mg total) daily thereafter Start taking on: April 07, 2023 What changed: See the new instructions.   gabapentin 300 MG capsule Commonly known as: NEURONTIN Take 1 capsule (300 mg total) by mouth 2 (two) times daily. What changed: when to take this   HumaLOG KwikPen 100 UNIT/ML KwikPen Generic drug: insulin lispro Inject 0-12 units into the skin three times daily with meals. Per sliding scale.   metoprolol tartrate 25  MG tablet Commonly known as: LOPRESSOR Take 1 tablet (25 mg total) by mouth 2 (two) times daily.   metroNIDAZOLE 500 MG tablet Commonly known as: Flagyl Take 1 tablet (500 mg total) by mouth 3 (three) times daily for 85 doses.   omeprazole 40 MG capsule Commonly known as: PRILOSEC Take 1 capsule (40 mg total) by mouth daily.   oxyCODONE 5 MG immediate release tablet Commonly known as: Oxy IR/ROXICODONE Take 1 tablet (5 mg total) by mouth every 6 (six) hours as needed for moderate pain or severe pain.   sildenafil 50 MG tablet Commonly known as: Viagra Take 1 tablet (50 mg total) by mouth daily as needed for erectile dysfunction. At least 24 hours between doses   TechLite Pen Needles 32G X 6 MM Misc Generic drug: Insulin Pen Needle USE AS DIRECTED   True Metrix Blood Glucose Test test strip Generic drug: glucose blood Use as directed   True Metrix Meter w/Device Kit Use to check blood sugar three times daily.  TRUEplus Lancets 28G Misc Use to check blood sugar three times daily.   valsartan-hydrochlorothiazide 160-25 MG tablet Commonly known as: DIOVAN-HCT Take 1 tablet by mouth daily.               Durable Medical Equipment  (From admission, onward)           Start     Ordered   04/06/23 1616  For home use only DME Bedside commode  Once       Question:  Patient needs a bedside commode to treat with the following condition  Answer:  Infectious and parasitic disease   04/06/23 1616   04/06/23 1616  For home use only DME Walker rolling  Once       Question Answer Comment  Walker: With 5 Inch Wheels   Patient needs a walker to treat with the following condition Infectious and parasitic disease      04/06/23 1616              Discharge Care Instructions  (From admission, onward)           Start     Ordered   04/07/23 0000  Discharge wound care:       Comments: Leave current dressing applied on the right foot until you see Dr. [name] next week.    04/07/23 1108            Follow-up Information     Nadara Mustard, MD Follow up in 1 week(s).   Specialty: Orthopedic Surgery Contact information: [address] [place] [address] [phone]         Hoy Register, MD Follow up.   Specialty: Family Medicine Contact information: [address] [place] [address] [place] [address] [phone]         [facility]. Schedule an appointment as soon as possible for a visit.   Specialty: Rehabilitation Contact information: [address] [place] [address] [phone]               No Known Allergies  Consultations:    Procedures/Studies: [place] EKG SITE RITE  Result Date: 03/29/2023 If Site Rite image not attached, placement could not be confirmed due to current cardiac rhythm.  DG Chest Port 1 View  Result Date: 03/25/2023 CLINICAL DATA:  Shortness of breath. EXAM: PORTABLE CHEST 1 VIEW COMPARISON:  03/23/2023 FINDINGS: Low volume film. Cardiopericardial silhouette is at upper limits of normal for size. There is pulmonary vascular congestion. Persistent diffuse interstitial prominence may be chronic but component of interstitial edema is not excluded. No dense focal airspace consolidation or evidence of pleural effusion. IMPRESSION: Low volume film with pulmonary vascular congestion and diffuse interstitial prominence. Interstitial edema not excluded. Electronically Signed   By: Kennith Center M.D.   On: 03/25/2023 06:14   CT Angio Chest Pulmonary Embolism (PE) W or WO Contrast  Result Date: 03/24/2023 CLINICAL DATA:  Concern for pulmonary embolism. EXAM: CT ANGIOGRAPHY CHEST WITH CONTRAST TECHNIQUE: Multidetector CT imaging of the chest was performed using the standard protocol during bolus administration of intravenous contrast. Multiplanar CT image reconstructions and MIPs were obtained to evaluate the vascular anatomy. RADIATION DOSE REDUCTION: This  exam was performed according to the departmental dose-optimization program which includes automated exposure control, adjustment of the mA and/or kV according to patient size and/or use of iterative reconstruction technique. CONTRAST:  75mL OMNIPAQUE IOHEXOL 350 MG/ML SOLN COMPARISON:  Chest CT dated 05/23/2019. FINDINGS: Cardiovascular: There is no  cardiomegaly or pericardial effusion. There is coronary vascular calcification. The thoracic aorta is unremarkable. Evaluation of the pulmonary arteries is limited due to respiratory motion. No pulmonary artery embolus identified. Mediastinum/Nodes: No hilar or mediastinal adenopathy. The esophagus is grossly unremarkable. No mediastinal fluid collection. Lungs/Pleura: Small bilateral pleural effusions. Minimal compressive atelectasis of the lung bases. Pneumonia is not excluded. There is no pneumothorax. The central airways are patent. Upper Abdomen: No acute abnormality. Musculoskeletal: Mild subcutaneous edema. No acute osseous pathology. Review of the MIP images confirms the above findings. IMPRESSION: 1. No CT evidence of pulmonary embolism. 2. Small bilateral pleural effusions with minimal compressive atelectasis of the lung bases. Pneumonia is not excluded. Electronically Signed   By: Elgie Collard M.D.   On: 03/24/2023 20:06   ECHOCARDIOGRAM COMPLETE  Result Date: 03/24/2023    ECHOCARDIOGRAM REPORT   Patient Name:   SCHAD MELKA Date of Exam: 03/24/2023 Medical Rec #:  [id number]    Height:       71.0 in Accession #:    [id number]   Weight:       299.8 lb Date of Birth:  [birth year]     BSA:          2.504 m Patient Age:    52 years     BP:           114/53 mmHg Patient Gender: M            HR:           129 bpm. Exam Location:  Inpatient Procedure: 2D Echo, Cardiac Doppler, Color Doppler and Intracardiac            Opacification Agent Indications:    R00.8 Other abnormalities of heart beat  History:        Patient has prior history of Echocardiogram  examinations, most                 recent 08/15/2022. Arrythmias:Atrial Fibrillation; Risk                 Factors:Diabetes, Dyslipidemia and Hypertension.  Sonographer:    Milbert Coulter Referring Phys: [phone] A CALDWELL POWELL JR  Sonographer Comments: Patient is obese. Image acquisition challenging due to patient body habitus. IMPRESSIONS  1. Left ventricular ejection fraction, by estimation, is 70 to 75%. The left ventricle has hyperdynamic function. The left ventricle has no regional wall motion abnormalities. Left ventricular diastolic parameters are indeterminate.  2. Right ventricular systolic function is hyperdynamic. The right ventricular size is severely enlarged.  3. The mitral valve was not well visualized. No evidence of mitral valve regurgitation. No evidence of mitral stenosis.  4. The aortic valve was not well visualized. Aortic valve regurgitation is not visualized.  5. The inferior vena cava is normal in size with greater than 50% respiratory variability, suggesting right atrial pressure of 3 mmHg. Comparison(s): Prior images reviewed side by side. Function is now hyperdynamic. Heart rate notably faster. FINDINGS  Left Ventricle: Left ventricular ejection fraction, by estimation, is 70 to 75%. The left ventricle has hyperdynamic function. The left ventricle has no regional wall motion abnormalities. Definity contrast agent was given IV to delineate the left ventricular endocardial borders. The left ventricular internal cavity size was normal in size. Suboptimal image quality limits for assessment of left ventricular hypertrophy. Left ventricular diastolic parameters are indeterminate. Right Ventricle: The right ventricular size is severely enlarged. Right vetricular wall thickness was not well visualized. Right ventricular systolic function is hyperdynamic. Left Atrium:  Left atrial size was not well visualized. Right Atrium: Right atrial size was not well visualized. Pericardium: There is no evidence  of pericardial effusion. Mitral Valve: The mitral valve was not well visualized. No evidence of mitral valve regurgitation. No evidence of mitral valve stenosis. Tricuspid Valve: The tricuspid valve is normal in structure. Tricuspid valve regurgitation is not demonstrated. No evidence of tricuspid stenosis. Aortic Valve: The aortic valve was not well visualized. Aortic valve regurgitation is not visualized. Aortic valve mean gradient measures 5.0 mmHg. Aortic valve peak gradient measures 8.3 mmHg. Aortic valve area, by VTI measures 2.61 cm. Pulmonic Valve: The pulmonic valve was not well visualized. Pulmonic valve regurgitation is not visualized. No evidence of pulmonic stenosis. Aorta: The aortic root is normal in size and structure. Venous: The inferior vena cava is normal in size with greater than 50% respiratory variability, suggesting right atrial pressure of 3 mmHg. IAS/Shunts: The interatrial septum was not well visualized.  LEFT VENTRICLE PLAX 2D LVIDd:         4.00 cm LVIDs:         2.50 cm LV PW:         1.10 cm LV IVS:        1.20 cm LVOT diam:     1.90 cm LV SV:         48 LV SV Index:   19 LVOT Area:     2.84 cm  RIGHT VENTRICLE RV S prime:     14.40 cm/s TAPSE (M-mode): 2.1 cm LEFT ATRIUM             Index        RIGHT ATRIUM           Index LA diam:        3.50 cm 1.40 cm/m   RA Area:     17.50 cm LA Vol (A2C):   35.7 ml 14.25 ml/m  RA Volume:   47.80 ml  19.09 ml/m LA Vol (A4C):   37.9 ml 15.13 ml/m LA Biplane Vol: 39.7 ml 15.85 ml/m  AORTIC VALVE AV Area (Vmax):    2.64 cm AV Area (Vmean):   2.46 cm AV Area (VTI):     2.61 cm AV Vmax:           144.00 cm/s AV Vmean:          97.900 cm/s AV VTI:            0.185 m AV Peak Grad:      8.3 mmHg AV Mean Grad:      5.0 mmHg LVOT Vmax:         134.00 cm/s LVOT Vmean:        84.900 cm/s LVOT VTI:          0.170 m LVOT/AV VTI ratio: 0.92  SHUNTS Systemic VTI:  0.17 m Systemic Diam: 1.90 cm Riley Lam MD Electronically signed by Riley Lam MD Signature Date/Time: 03/24/2023/11:49:21 AM    Final    VAS [place] ABI WITH/WO TBI  Result Date: 03/23/2023  LOWER EXTREMITY DOPPLER STUDY Patient Name:  MANVILLE WOLAVER  Date of Exam:   03/23/2023 Medical Rec #: [id number]     Accession #:    [id number] Date of Birth: 1971-05-09      Patient Gender: M Patient Age:   74 years Exam Location:  [facility] Procedure:      VAS [place] ABI WITH/WO TBI Referring Phys: A POWELL JR --------------------------------------------------------------------------------  Indications: Ulceration,  and peripheral artery disease. High Risk Factors: Diabetes.  Comparison Study: No prior study Performing Technologist: Shona Simpson  Examination Guidelines: A complete evaluation includes at minimum, Doppler waveform signals and systolic blood pressure reading at the level of bilateral brachial, anterior tibial, and posterior tibial arteries, when vessel segments are accessible. Bilateral testing is considered an integral part of a complete examination. Photoelectric Plethysmograph (PPG) waveforms and toe systolic pressure readings are included as required and additional duplex testing as needed. Limited examinations for reoccurring indications may be performed as noted.  ABI Findings: +--------+------------------+-----+---------+--------+ Right   Rt Pressure (mmHg)IndexWaveform Comment  +--------+------------------+-----+---------+--------+ [id number]                    triphasic         +--------+------------------+-----+---------+--------+ PTA     98                0.68 biphasic          +--------+------------------+-----+---------+--------+ DP      131               0.91 biphasic          +--------+------------------+-----+---------+--------+ +--------+------------------+-----+---------+-------+ Left    Lt Pressure (mmHg)IndexWaveform Comment +--------+------------------+-----+---------+-------+ [id number]                    triphasic         +--------+------------------+-----+---------+-------+ PTA     157               1.09 biphasic         +--------+------------------+-----+---------+-------+ DP      162               1.12 biphasic         +--------+------------------+-----+---------+-------+ TOES Findings:  Unable to insonate due to open wounds +---------+---------------+--------+-------+ Left ToesPressure (mmHg)WaveformComment +---------+---------------+--------+-------+ 3rd Digit               Normal          +---------+---------------+--------+-------+ Unable to assess other digits or test compression due to size/shape incompatibility with sensor   Summary: Right: Resting right ankle-brachial index indicates mild right lower extremity arterial disease. Left: Resting left ankle-brachial index is within normal range. *See table(s) above for measurements and observations.  Electronically signed by Lemar Livings MD on 03/23/2023 at 6:58:35 PM.    Final    VAS [place] LOWER EXTREMITY VENOUS (DVT)  Result Date: 03/23/2023  Lower Venous DVT Study Patient Name:  GURFATEH FAGERBERG  Date of Exam:   03/23/2023 Medical Rec #: [id number]     Accession #:    [id number] Date of Birth: Aug 04, 1971      Patient Gender: M Patient Age:   45 years Exam Location:  [facility] Procedure:      VAS [place] LOWER EXTREMITY VENOUS (DVT) Referring Phys: A POWELL JR --------------------------------------------------------------------------------  Indications: Edema, and ulceration.  Risk Factors: Limited mobility. Limitations: Body habitus. Comparison Study: No significant changes seen since prior exam 10/03/21. Performing Technologist: Shona Simpson  Examination Guidelines: A complete evaluation includes B-mode imaging, spectral Doppler, color Doppler, and power Doppler as needed of all accessible portions of each vessel. Bilateral testing is considered an integral part of a complete examination. Limited examinations for reoccurring indications may be  performed as noted. The reflux portion of the exam is performed with the patient in reverse Trendelenburg.  +---------+---------------+---------+-----------+---------------+--------------+ RIGHT    CompressibilityPhasicitySpontaneityProperties     Thrombus Aging +---------+---------------+---------+-----------+---------------+--------------+ CFV  Full           Yes      Yes                                      +---------+---------------+---------+-----------+---------------+--------------+ SFJ      Full                                                             +---------+---------------+---------+-----------+---------------+--------------+ FV Prox  Full                                                             +---------+---------------+---------+-----------+---------------+--------------+ FV Mid   Full                                                             +---------+---------------+---------+-----------+---------------+--------------+ FV DistalFull                                                             +---------+---------------+---------+-----------+---------------+--------------+ PFV      Full                                                             +---------+---------------+---------+-----------+---------------+--------------+ POP      Full           Yes      Yes                                      +---------+---------------+---------+-----------+---------------+--------------+ PTV      Full                                                             +---------+---------------+---------+-----------+---------------+--------------+ PERO                                        Patent by color               +---------+---------------+---------+-----------+---------------+--------------+   +----+---------------+---------+-----------+----------+--------------+ LEFTCompressibilityPhasicitySpontaneityPropertiesThrombus  Aging +----+---------------+---------+-----------+----------+--------------+ CFV Full           Yes      Yes                                 +----+---------------+---------+-----------+----------+--------------+  Summary: RIGHT: - There is no evidence of deep vein thrombosis in the lower extremity.  - No cystic structure found in the popliteal fossa.  LEFT: - No evidence of common femoral vein obstruction.  *See table(s) above for measurements and observations. Electronically signed by Lemar Livings MD on 03/23/2023 at 6:56:49 PM.    Final    NM Pulmonary Perfusion  Result Date: 03/23/2023 CLINICAL DATA:  Tachycardia, RIGHT leg swelling EXAM: NUCLEAR MEDICINE PERFUSION LUNG SCAN TECHNIQUE: Perfusion images were obtained in multiple projections after intravenous injection of radiopharmaceutical. RADIOPHARMACEUTICALS:  4.2 mCi Tc-12m MAA COMPARISON:  03/23/2023 FINDINGS: No wedge-shaped peripheral perfusion defect noted in the LEFT or RIGHT lung to suggest acute pulmonary embolism. Cardiac attenuation noted. IMPRESSION: No evidence acute pulmonary embolism. Electronically Signed   By: Genevive Bi M.D.   On: 03/23/2023 16:13   US RENAL  Result Date: 03/23/2023 CLINICAL DATA:  Acute kidney injury EXAM: RENAL / URINARY TRACT ULTRASOUND COMPLETE COMPARISON:  None Available. FINDINGS: Right Kidney: Renal measurements: 11.8 x 5.8 x 5.2 cm = volume: 183 mL. Echogenicity within normal limits. No mass or hydronephrosis visualized. Left Kidney: Renal measurements: 12.0 x 6.7 x 6.3 cm = volume: 266 mL. Echogenicity within normal limits. No mass or hydronephrosis visualized. Bladder: Not well visualized due to underdistention. Other: None. IMPRESSION: No hydronephrosis. Electronically Signed   By: Allegra Lai M.D.   On: 03/23/2023 13:49   DG Chest Port 1 View  Result Date: 03/23/2023 CLINICAL DATA:  Shortness of breath EXAM: PORTABLE CHEST 1 VIEW COMPARISON:  08/14/2022 FINDINGS: Cardiomegaly.  Diffuse bilateral interstitial pulmonary opacity. The visualized skeletal structures are unremarkable. IMPRESSION: Cardiomegaly with diffuse bilateral interstitial pulmonary opacity, consistent with edema or atypical/viral infection. No focal airspace opacity. Electronically Signed   By: Jearld Lesch M.D.   On: 03/23/2023 12:24   DG Foot Complete Right  Result Date: 03/22/2023 CLINICAL DATA:  Possible diabetic infection. EXAM: RIGHT FOOT COMPLETE - 3+ VIEW COMPARISON:  None Available. FINDINGS: Erosive changes of the distal phalanges of the first and second digits. In the absence of history of amputation findings suspicious for osteomyelitis. No acute fracture. There is no dislocation. The bones are osteopenic. Diffuse skin thickening and soft tissue edema. Dressing noted over the forefoot. IMPRESSION: Erosive changes of the distal phalanges of the first and second digits suspicious for osteomyelitis. Electronically Signed   By: Elgie Collard M.D.   On: 03/22/2023 20:11      Subjective:  Significant events overnight, he denies any complaints, eager to go home Discharge Exam: Vitals:   04/07/23 0139 04/07/23 0718  BP: 126/65 (!) 164/94  Pulse: 75 79  Resp: 18 15  Temp: 99.3 F (37.4 C) 98.3 F (36.8 C)  SpO2:     Vitals:   04/06/23 1929 04/07/23 0014 04/07/23 0139 04/07/23 0718  BP: (!) 164/85  126/65 (!) 164/94  Pulse: 65  75 79  Resp: 18  18 15   Temp: 97.7 F (36.5 C) 99.3 F (37.4 C) 99.3 F (37.4 C) 98.3 F (36.8 C)  TempSrc: Oral Oral Oral   SpO2: 95%     Weight:      Height:        General: Pt is alert, awake, not in acute distress Cardiovascular: RRR, S1/S2 +, no rubs, no gallops Respiratory: CTA bilaterally, no wheezing, no rhonchi Abdominal: Soft, NT, ND, bowel sounds + Extremities: no cyanosis, bilateral lower extremity edema, right foot bandaged, PICC line discontinued before discharge    The results  of significant diagnostics from this hospitalization  (including imaging, microbiology, ancillary and laboratory) are listed below for reference.     Microbiology: No results found for this or any previous visit (from the past 240 hour(s)).   Labs: BNP (last 3 results) Recent Labs    03/27/23 0658 03/28/23 0207 03/29/23 0222  BNP 320.4* 314.3* 246.2*   Basic Metabolic Panel: Recent Labs  Lab 04/01/23 0338 04/05/23 0314 04/06/23 0338 04/06/23 2131 04/07/23 0323  NA 138 138 137 137 136  K 3.7 3.9 3.6 4.0 3.9  CL 107 110 105 100 105  CO2 23 23 23 24 24   GLUCOSE 64* 142* 52* 209* 144*  BUN 10 12 13 14 15   CREATININE 1.06 0.95 0.97 1.05 1.01  CALCIUM 8.3* 8.5* 8.6* 8.8* 8.7*  MG 1.5*  --   --   --   --    Liver Function Tests: No results for input(s): "AST", "ALT", "ALKPHOS", "BILITOT", "PROT", "ALBUMIN" in the last 168 hours. No results for input(s): "LIPASE", "AMYLASE" in the last 168 hours. No results for input(s): "AMMONIA" in the last 168 hours. CBC: Recent Labs  Lab 04/01/23 0338 04/05/23 0314 04/06/23 0338 04/07/23 0323  WBC 7.2 5.5 4.3 4.8  NEUTROABS 4.5  --   --   --   HGB 8.0* 8.5* 7.0* 8.2*  HCT 25.2* 27.7* 22.4* 26.6*  MCV 86.6 87.9 85.5 86.6  PLT 541* 523* 406* 453*   Cardiac Enzymes: Recent Labs  Lab 04/03/23 0348  CKTOTAL 135   BNP: Invalid input(s): "POCBNP" CBG: Recent Labs  Lab 04/06/23 1348 04/06/23 1712 04/06/23 2144 04/07/23 0713 04/07/23 1219  GLUCAP 129* 132* 201* 126* 283*   D-Dimer No results for input(s): "DDIMER" in the last 72 hours. Hgb A1c No results for input(s): "HGBA1C" in the last 72 hours. Lipid Profile No results for input(s): "CHOL", "HDL", "LDLCALC", "TRIG", "CHOLHDL", "LDLDIRECT" in the last 72 hours. Thyroid function studies No results for input(s): "TSH", "T4TOTAL", "T3FREE", "THYROIDAB" in the last 72 hours.  Invalid input(s): "FREET3" Anemia work up No results for input(s): "VITAMINB12", "FOLATE", "FERRITIN", "TIBC", "IRON", "RETICCTPCT" in the last 72  hours. Urinalysis    Component Value Date/Time   COLORURINE YELLOW 03/22/2023 2249   APPEARANCEUR HAZY (A) 03/22/2023 2249   APPEARANCEUR Clear 12/09/2021 1403   LABSPEC 1.017 03/22/2023 2249   PHURINE 5.0 03/22/2023 2249   GLUCOSEU >=500 (A) 03/22/2023 2249   HGBUR SMALL (A) 03/22/2023 2249   BILIRUBINUR NEGATIVE 03/22/2023 2249   BILIRUBINUR Negative 12/09/2021 1403   KETONESUR 20 (A) 03/22/2023 2249   PROTEINUR 100 (A) 03/22/2023 2249   UROBILINOGEN 0.2 11/14/2021 1146   NITRITE NEGATIVE 03/22/2023 2249   LEUKOCYTESUR MODERATE (A) 03/22/2023 2249   Sepsis Labs Recent Labs  Lab 04/01/23 0338 04/05/23 0314 04/06/23 0338 04/07/23 0323  WBC 7.2 5.5 4.3 4.8   Microbiology No results found for this or any previous visit (from the past 240 hour(s)).   Time coordinating discharge: Over 30 minutes  SIGNED:   Huey Bienenstock, MD  [facility] 04/07/2023, 3:54 PM Pager   If 7PM-7AM, please contact night-coverage [email or web address] Password [email or web address]

## 2023-04-07 NOTE — Progress Notes (Signed)
[facility]  Date of Admission:  03/22/2023     Total days of antibiotics 16       ASSESSMENT:  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 Discussed plan of care for an additional 4 works of antibiotics with amoxicillin 1 gram tid and metronidazole (EOT 9/25) and then continued treatment for Schallia ondontolyticus. Continue post-operative wound care per Orthopedics. Follow up in ID clinic. Ok for discharge from ID perspective at completion of noon dose of Unasyn. Remaining medical and supportive care per Internal Medicine.   PLAN:  Change antibiotics to amoxicillin 1 g PO tid and Metronidazole 500 mg PO tid Post-operative wound care per Orthopedics. Ok for discharge follow noon dose of Unasyn.  Follow up in ID clinic on 04/16/23 with Dr. Daiva Eves. Remaining medical and supportive care per Internal Medicine.   I have personally spent 25 minutes involved in face-to-face and non-face-to-face activities for this patient on the day of the visit. Professional time spent includes the following activities: Preparing to see the patient (review of tests), Obtaining and/or reviewing separately obtained history (admission/discharge record), Performing a medically appropriate examination and/or evaluation , Ordering medications/tests/procedures, referring and communicating with other health care professionals, Documenting clinical information in the EMR, Independently interpreting results (not separately reported), Communicating results to the patient/family/caregiver, Counseling and educating the patient/family/caregiver and Care coordination (not separately reported).   Principal Problem:   Diabetic foot infection (HCC) Active Problems:   Essential hypertension   Type 2 diabetes mellitus with other specified complication (HCC)   PAF (paroxysmal atrial fibrillation)  (HCC)   Osteomyelitis (HCC)   HLD (hyperlipidemia)   Gastroparesis   Severe protein-calorie malnutrition (HCC)   Lymphedema   Abscess of right foot   Sinus tachycardia   Right ventricular enlargement    acidophilus  2 capsule Oral Daily   vitamin C  1,000 mg Oral Daily   aspirin EC  81 mg Oral Daily   atorvastatin  20 mg Oral Daily   buPROPion  150 mg Oral Daily   Chlorhexidine Gluconate Cloth  6 each Topical Daily   docusate sodium  200 mg Oral BID   ferrous sulfate  325 mg Oral BID WC   folic acid  1 mg Oral Daily   furosemide  40 mg Intravenous Daily   gabapentin  300 mg Oral BID   heparin injection (subcutaneous)  5,000 Units Subcutaneous Q8H   insulin aspart  0-9 Units Subcutaneous TID WC   insulin glargine-yfgn  10 Units Subcutaneous Daily   metoCLOPramide  10 mg Oral TID AC & HS   metoprolol tartrate  25 mg Oral BID   nutrition supplement (JUVEN)  1 packet Oral BID BM   pantoprazole  40 mg Oral Daily    SUBJECTIVE:  Afebrile overnight with no acute events. Eager for discharge.   No Known Allergies   Review of Systems: Review of Systems  Constitutional:  Negative for chills, fever and weight loss.  Respiratory:  Negative for cough, shortness of breath and wheezing.   Cardiovascular:  Negative for chest pain and leg swelling.  Gastrointestinal:  Negative for abdominal pain, constipation, diarrhea, nausea and vomiting.  Skin:  Negative for rash.      OBJECTIVE: Vitals:   04/06/23 1929 04/07/23 0014 04/07/23 0139 04/07/23 0718  BP: (!) 164/85  126/65 (!) 164/94  Pulse: 65  75 79  Resp: 18  18 15  Temp: 97.7 F (36.5 C) 99.3 F (37.4 C) 99.3 F (37.4 C) 98.3 F (36.8 C)  TempSrc: Oral Oral Oral   SpO2: 95%     Weight:      Height:       Body mass index is 45.08 kg/m.  Physical Exam Constitutional:      General: He is not in acute distress.    Appearance: He is well-developed.  Cardiovascular:     Rate and Rhythm: Normal rate and regular  rhythm.     Heart sounds: Normal heart sounds.  Pulmonary:     Effort: Pulmonary effort is normal.     Breath sounds: Normal breath sounds.  Skin:    General: Skin is warm and dry.  Neurological:     Mental Status: He is alert.     Lab Results Lab Results  Component Value Date   WBC 4.8 04/07/2023   HGB 8.2 (L) 04/07/2023   HCT 26.6 (L) 04/07/2023   MCV 86.6 04/07/2023   PLT 453 (H) 04/07/2023    Lab Results  Component Value Date   CREATININE 1.01 04/07/2023   BUN 15 04/07/2023   NA 136 04/07/2023   K 3.9 04/07/2023   CL 105 04/07/2023   CO2 24 04/07/2023    Lab Results  Component Value Date   ALT 14 03/24/2023   AST 14 (L) 03/24/2023   ALKPHOS 50 03/24/2023   BILITOT 0.4 03/24/2023     Microbiology: No results found for this or any previous visit (from the past 240 hour(s)).   Taylor Eke, NP [facility]  04/07/2023  11:56 AM

## 2023-04-07 NOTE — Discharge Instructions (Signed)
Follow with Primary MD Hoy Register, MD in 7 days   Get CBC, CMP,  checked  by Primary MD next visit.     Disposition Home    Diet: Heart Healthy/carb moodified  On your next visit with your primary care physician please Get Medicines reviewed and adjusted.   Please request your Prim.MD to go over all Hospital Tests and Procedure/Radiological results at the follow up, please get all Hospital records sent to your Prim MD by signing hospital release before you go home.   If you experience worsening of your admission symptoms, develop shortness of breath, life threatening emergency, suicidal or homicidal thoughts you must seek medical attention immediately by calling 911 or calling your MD immediately  if symptoms less severe.  You Must read complete instructions/literature along with all the possible adverse reactions/side effects for all the Medicines you take and that have been prescribed to you. Take any new Medicines after you have completely understood and accpet all the possible adverse reactions/side effects.   Do not drive, operating heavy machinery, perform activities at heights, swimming or participation in water activities or provide baby sitting services if your were admitted for syncope or siezures until you have seen by Primary MD or a Neurologist and advised to do so again.  Do not drive when taking Pain medications.    Do not take more than prescribed Pain, Sleep and Anxiety Medications  Special Instructions: If you have smoked or chewed Tobacco  in the last 2 yrs please stop smoking, stop any regular Alcohol  and or any Recreational drug use.  Wear Seat belts while driving.   Please note  You were cared for by a hospitalist during your hospital stay. If you have any questions about your discharge medications or the care you received while you were in the hospital after you are discharged, you can call the unit and asked to speak with the hospitalist on call if the  hospitalist that took care of you is not available. Once you are discharged, your primary care physician will handle any further medical issues. Please note that NO REFILLS for any discharge medications will be authorized once you are discharged, as it is imperative that you return to your primary care physician (or establish a relationship with a primary care physician if you do not have one) for your aftercare needs so that they can reassess your need for medications and monitor your lab values.

## 2023-04-08 ENCOUNTER — Telehealth: Payer: Self-pay

## 2023-04-08 NOTE — Transitions of Care (Post Inpatient/ED Visit) (Signed)
   04/08/2023  Name: Taylor Hughes MRN: [id number] DOB: [birth year]  Today's TOC FU Call Status: Today's TOC FU Call Status:: Successful TOC FU Call Completed TOC FU Call Complete Date: 04/08/23 Patient's Name and Date of Birth confirmed.  Transition Care Management Follow-up Telephone Call Date of Discharge: 04/07/23 Discharge Facility: [facility]) Type of Discharge: Inpatient Admission Primary Inpatient Discharge Diagnosis:: osteomyelitis right foot How have you been since you were released from the hospital?: Better Any questions or concerns?: No (He stated that his fiance filed a Medicaid application for him and also filed for disability.  He has submitted FMLA paperwork to his employer requesting 6 weeks leave.)  Items Reviewed: Did you receive and understand the discharge instructions provided?: Yes Medications obtained,verified, and reconciled?: No Medications Not Reviewed Reasons:: Other: (He said he has all of his medications and a glucometer and he has already reviewed the med list and does not have any questions about the meds and does not need to review the list.) Any new allergies since your discharge?: No Dietary orders reviewed?: No Do you have support at home?: Yes People in Home: significant other Name of Support/Comfort Primary Source: his fiance  Medications Reviewed Today: Medications Reviewed Today   Medications were not reviewed in this encounter     Home Care and Equipment/Supplies: Were Home Health Services Ordered?: No Any new equipment or medical supplies ordered?: Yes Name of Medical supply agency?: [employer] [facility] Were you able to get the equipment/medical supplies?: No (He said his insurance did not cover the DME so he did not get it.) Do you have any questions related to the use of the equipment/supplies?: No  Functional Questionnaire: Do you need assistance with bathing/showering or dressing?: No Do you need assistance with meal  preparation?: No Do you need assistance with eating?: No Do you have difficulty maintaining continence: No Do you need assistance with getting out of bed/getting out of a chair/moving?: Yes (He is ambulating with a cane. The dressing is intact on his right foot and understands that it is to stay in place until he sees Dr [name].  referrals have been made to outpatient PT/OT) Do you have difficulty managing or taking your medications?: No  Follow up appointments reviewed: PCP Follow-up appointment confirmed?: Yes Date of PCP follow-up appointment?: 04/27/23 Follow-up Provider: [facility] Follow-up appointment confirmed?: No Reason Specialist Follow-Up Not Confirmed: Patient has Specialist Provider Number and will Call for Appointment (He said he needs to call Dr Audrie Lia office today to schedule his follow up appoinment for next week.) Do you need transportation to your follow-up appointment?: No Do you understand care options if your condition(s) worsen?: Yes-patient verbalized understanding    SIGNATURE. Robyne Peers, RN

## 2023-04-09 ENCOUNTER — Other Ambulatory Visit: Payer: Self-pay

## 2023-04-14 ENCOUNTER — Ambulatory Visit (INDEPENDENT_AMBULATORY_CARE_PROVIDER_SITE_OTHER): Payer: PRIVATE HEALTH INSURANCE | Admitting: Family

## 2023-04-14 ENCOUNTER — Other Ambulatory Visit: Payer: Self-pay

## 2023-04-14 ENCOUNTER — Encounter: Payer: Self-pay | Admitting: Family

## 2023-04-14 DIAGNOSIS — I890 Lymphedema, not elsewhere classified: Secondary | ICD-10-CM

## 2023-04-14 DIAGNOSIS — L02611 Cutaneous abscess of right foot: Secondary | ICD-10-CM

## 2023-04-14 MED ORDER — OXYCODONE HCL 5 MG PO TABS
5.0000 mg | ORAL_TABLET | Freq: Four times a day (QID) | ORAL | 0 refills | Status: DC | PRN
  Filled 2023-04-14: qty 30, 8d supply, fill #0

## 2023-04-14 NOTE — Progress Notes (Signed)
Post-Op Visit Note   Patient: Taylor Hughes           Date of Birth: [birth year]           MRN: [id number] Visit Date: 04/14/2023 PCP: Hoy Register, MD  Chief Complaint:  Chief Complaint  Patient presents with   Right Foot - Routine Post Op    03/25/23 right transmet amputation    HPI:  HPI The patient is a 52 year old gentleman seen status post right transmetatarsal amputation  Ortho Exam On examination of the right foot the incisions approximated with sutures there is no gaping there is no surrounding maceration or active drainage  Visit Diagnoses: No diagnosis found.  Plan: Begin daily dose of cleansing.  Dry dressings.  Discussed nonweightbearing.  Will follow-up in 2 weeks for suture removal  Follow-Up Instructions: No follow-ups on file.   Imaging: No results found.  Orders:  No orders of the defined types were placed in this encounter.  Meds ordered this encounter  Medications   oxyCODONE (OXY IR/ROXICODONE) 5 MG immediate release tablet    Sig: Take 1 tablet (5 mg total) by mouth every 6 (six) hours as needed for moderate pain or severe pain.    Dispense:  30 tablet    Refill:  0     PMFS History: Patient Active Problem List   Diagnosis Date Noted   Severe protein-calorie malnutrition (HCC) 03/24/2023   Lymphedema 03/24/2023   Abscess of right foot 03/24/2023   Sinus tachycardia 03/24/2023   Right ventricular enlargement 03/24/2023   Diabetic foot infection (HCC) 03/23/2023   Osteomyelitis (HCC) 03/22/2023   Atrial fibrillation, chronic (HCC) 03/22/2023   HLD (hyperlipidemia) 03/22/2023   Gastroparesis 03/22/2023   Depression 09/08/2022   DKA (diabetic ketoacidosis) (HCC) 08/13/2022   Influenza A 08/13/2022   GERD (gastroesophageal reflux disease) 03/03/2022   Diabetes mellitus (HCC) 09/19/2021   Abscess of neck 09/19/2021   URI (upper respiratory infection) 06/07/2021   PAF (paroxysmal atrial fibrillation) (HCC)    Hypokalemia    Normocytic  anemia    Sepsis (HCC) 05/24/2019   Cellulitis of chest wall    Venous insufficiency (chronic) (peripheral) 03/15/2019   Edema of both lower extremities due to peripheral venous insufficiency 03/15/2019   Hypoglycemia due to insulin 12/18/2018   Neck abscess 11/23/2018   Essential hypertension 11/23/2018   AKI (acute kidney injury) (HCC) 11/23/2018   Obesity (BMI 30.0-34.9) 11/23/2018   Type 2 diabetes mellitus with other specified complication (HCC) 11/23/2018   Sepsis due to group B Streptococcus (HCC)    Diabetic infection of left foot (HCC)    Left foot infection    DMII (diabetes mellitus, type 2) (HCC) 07/28/2018   Leukocytosis 07/28/2018   Hyponatremia 07/28/2018   Past Medical History:  Diagnosis Date   Atrial fibrillation (HCC)    Diabetes mellitus without complication (HCC)    Type 2   GERD (gastroesophageal reflux disease)    Wears glasses    Wound, open, foot    left diabetic     Family History  Problem Relation Age of Onset   Diabetes Mother     Past Surgical History:  Procedure Laterality Date   AMPUTATION Left 08/20/2018   Procedure: LEFT FOOT IRRIGATON AND DEBRIDEMENT, 5TH RAY AMPUTATION;  Surgeon: Nadara Mustard, MD;  Location: [facility] OR;  Service: Orthopedics;  Laterality: Left;   AMPUTATION Right 03/25/2023   Procedure: RIGHT TRANSMETATARSAL AMPUTATION;  Surgeon: Nadara Mustard, MD;  Location: [facility] OR;  Service: Orthopedics;  Laterality: Right;   APPLICATION OF A-CELL OF CHEST/ABDOMEN N/A 05/30/2019   Procedure: Application Of A-Cell Of Chest;  Surgeon: Corliss Skains, MD;  Location: [facility] OR;  Service: Cardiothoracic;  Laterality: N/A;   APPLICATION OF WOUND VAC N/A 05/30/2019   Procedure: Application Of Wound Vac;  Surgeon: Corliss Skains, MD;  Location: [facility] OR;  Service: Cardiothoracic;  Laterality: N/A;   FINGER SURGERY Right 2016   I&D  small finger   I & D EXTREMITY Left 07/30/2018   Procedure: IRRIGATION AND DEBRIDEMENT LEFT FOOT WITH POSSIBLE  AMPUTATION OF FIFTH TOE;  Surgeon: Kathryne Hitch, MD;  Location: [facility] ORS;  Service: Orthopedics;  Laterality: Left;   INCISION AND DRAINAGE ABSCESS Left 01/19/2019   Procedure: INCISION AND DRAINAGE ABSCESS upper chest;  Surgeon: Christia Reading, MD;  Location: [facility] OR;  Service: ENT;  Laterality: Left;   IRRIGATION AND DEBRIDEMENT STERNOCLAVICULAR JOINT-STERNUM AND RIBS N/A 05/30/2019   Procedure: IRRIGATION AND DEBRIDEMENT OF STERNOCLAVICULAR JOINT-STERNUM AND RIBS ;  Surgeon: Corliss Skains, MD;  Location: [facility] OR;  Service: Cardiothoracic;  Laterality: N/A;   MINOR IRRIGATION AND DEBRIDEMENT OF WOUND N/A 11/22/2018   Procedure: INCISION AND DRAINAGE OF NECK ABSCESS;  Surgeon: Christia Reading, MD;  Location: [facility] ORS;  Service: ENT;  Laterality: N/A;   Social History   Occupational History   Not on file  Tobacco Use   Smoking status: Never   Smokeless tobacco: Never  Vaping Use   Vaping status: Never Used  Substance and Sexual Activity   Alcohol use: No    Alcohol/week: 0.0 standard drinks of alcohol   Drug use: No   Sexual activity: Not on file

## 2023-04-16 ENCOUNTER — Other Ambulatory Visit: Payer: Self-pay

## 2023-04-16 ENCOUNTER — Ambulatory Visit: Payer: PRIVATE HEALTH INSURANCE | Attending: Internal Medicine | Admitting: Physical Therapy

## 2023-04-16 ENCOUNTER — Ambulatory Visit: Payer: PRIVATE HEALTH INSURANCE | Admitting: Infectious Disease

## 2023-04-16 DIAGNOSIS — M6281 Muscle weakness (generalized): Secondary | ICD-10-CM | POA: Insufficient documentation

## 2023-04-16 DIAGNOSIS — G547 Phantom limb syndrome without pain: Secondary | ICD-10-CM | POA: Insufficient documentation

## 2023-04-16 DIAGNOSIS — R531 Weakness: Secondary | ICD-10-CM | POA: Diagnosis not present

## 2023-04-16 DIAGNOSIS — I890 Lymphedema, not elsewhere classified: Secondary | ICD-10-CM | POA: Diagnosis present

## 2023-04-16 DIAGNOSIS — R2681 Unsteadiness on feet: Secondary | ICD-10-CM | POA: Insufficient documentation

## 2023-04-16 DIAGNOSIS — R2689 Other abnormalities of gait and mobility: Secondary | ICD-10-CM | POA: Diagnosis present

## 2023-04-16 DIAGNOSIS — M869 Osteomyelitis, unspecified: Secondary | ICD-10-CM | POA: Insufficient documentation

## 2023-04-16 NOTE — Therapy (Signed)
OUTPATIENT PHYSICAL THERAPY NEURO EVALUATION   Patient Name: Taylor Hughes MRN: [id number] DOB:May 17, 1971, 52 y.o., male Today's Date: 04/16/2023   PCP: Hoy Register, MD REFERRING PROVIDER: Elgergawy, Leana Roe, MD  END OF SESSION:  PT End of Session - 04/16/23 1233     Visit Number 1    Number of Visits 5   Plus eval   Date for PT Re-Evaluation 05/28/23    Authorization Type [employer]    PT Start Time 1231    PT Stop Time 1310    PT Time Calculation (min) 39 min    Activity Tolerance Patient tolerated treatment well    Behavior During Therapy WFL for tasks assessed/performed             Past Medical History:  Diagnosis Date   Atrial fibrillation (HCC)    Diabetes mellitus without complication (HCC)    Type 2   GERD (gastroesophageal reflux disease)    Wears glasses    Wound, open, foot    left diabetic    Past Surgical History:  Procedure Laterality Date   AMPUTATION Left 08/20/2018   Procedure: LEFT FOOT IRRIGATON AND DEBRIDEMENT, 5TH RAY AMPUTATION;  Surgeon: Nadara Mustard, MD;  Location: [facility] OR;  Service: Orthopedics;  Laterality: Left;   AMPUTATION Right 03/25/2023   Procedure: RIGHT TRANSMETATARSAL AMPUTATION;  Surgeon: Nadara Mustard, MD;  Location: [facility] OR;  Service: Orthopedics;  Laterality: Right;   APPLICATION OF A-CELL OF CHEST/ABDOMEN N/A 05/30/2019   Procedure: Application Of A-Cell Of Chest;  Surgeon: Corliss Skains, MD;  Location[facility] OR;  Service: Cardiothoracic;  Laterality: N/A;   APPLICATION OF WOUND VAC N/A 05/30/2019   Procedure: Application Of Wound Vac;  Surgeon: Corliss Skains, MD;  Location: [facility] OR;  Service: Cardiothoracic;  Laterality: N/A;   FINGER SURGERY Right 2016   I&D  small finger   I & D EXTREMITY Left 07/30/2018   Procedure: IRRIGATION AND DEBRIDEMENT LEFT FOOT WITH POSSIBLE AMPUTATION OF FIFTH TOE;  Surgeon: Kathryne Hitch, MD;  Location: [facility] ORS;  Service: Orthopedics;  Laterality: Left;   INCISION  AND DRAINAGE ABSCESS Left 01/19/2019   Procedure: INCISION AND DRAINAGE ABSCESS upper chest;  Surgeon: Christia Reading, MD;  Location: [facility] OR;  Service: ENT;  Laterality: Left;   IRRIGATION AND DEBRIDEMENT STERNOCLAVICULAR JOINT-STERNUM AND RIBS N/A 05/30/2019   Procedure: IRRIGATION AND DEBRIDEMENT OF STERNOCLAVICULAR JOINT-STERNUM AND RIBS ;  Surgeon: Corliss Skains, MD;  Location: [facility] OR;  Service: Cardiothoracic;  Laterality: N/A;   MINOR IRRIGATION AND DEBRIDEMENT OF WOUND N/A 11/22/2018   Procedure: INCISION AND DRAINAGE OF NECK ABSCESS;  Surgeon: Christia Reading, MD;  Location: [facility] ORS;  Service: ENT;  Laterality: N/A;   Patient Active Problem List   Diagnosis Date Noted   Severe protein-calorie malnutrition (HCC) 03/24/2023   Lymphedema 03/24/2023   Abscess of right foot 03/24/2023   Sinus tachycardia 03/24/2023   Right ventricular enlargement 03/24/2023   Diabetic foot infection (HCC) 03/23/2023   Osteomyelitis (HCC) 03/22/2023   Atrial fibrillation, chronic (HCC) 03/22/2023   HLD (hyperlipidemia) 03/22/2023   Gastroparesis 03/22/2023   Depression 09/08/2022   DKA (diabetic ketoacidosis) (HCC) 08/13/2022   Influenza A 08/13/2022   GERD (gastroesophageal reflux disease) 03/03/2022   Diabetes mellitus (HCC) 09/19/2021   Abscess of neck 09/19/2021   URI (upper respiratory infection) 06/07/2021   PAF (paroxysmal atrial fibrillation) (HCC)    Hypokalemia    Normocytic anemia    Sepsis (HCC) 05/24/2019  Cellulitis of chest wall    Venous insufficiency (chronic) (peripheral) 03/15/2019   Edema of both lower extremities due to peripheral venous insufficiency 03/15/2019   Hypoglycemia due to insulin 12/18/2018   Neck abscess 11/23/2018   Essential hypertension 11/23/2018   AKI (acute kidney injury) (HCC) 11/23/2018   Obesity (BMI 30.0-34.9) 11/23/2018   Type 2 diabetes mellitus with other specified complication (HCC) 11/23/2018   Sepsis due to group B Streptococcus (HCC)     Diabetic infection of left foot (HCC)    Left foot infection    DMII (diabetes mellitus, type 2) (HCC) 07/28/2018   Leukocytosis 07/28/2018   Hyponatremia 07/28/2018    ONSET DATE: 04/07/2023 (referral)  REFERRING DIAG: M86.9 (ICD-10-CM) - Osteomyelitis of right foot, unspecified type (HCC) R53.1 (ICD-10-CM) - Weakness  THERAPY DIAG:  Other abnormalities of gait and mobility  Unsteadiness on feet  Muscle weakness (generalized)  Phantom limb syndrome without pain (HCC)  Lymphedema, not elsewhere classified  Rationale for Evaluation and Treatment: Rehabilitation  SUBJECTIVE:                                                                                                                                                                                             SUBJECTIVE STATEMENT: Pt ambulated into session without AD, wearing boot on RLE. Saw his surgeon on Tuesday, reports his wound looks good and he will follow up in 2 weeks and hopes to be cleared from the boot then. Is going to get an orthotic for his shoe. Denies falls. Still has high pain days, has been trying to limit his walking so he does not overdo anything. Has a phantom sensation, but no pain. 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 Has lymphedema bilaterally, only gets relief if he elevates his leg. Has had lymphedema for several years.   Pt accompanied by: self  PERTINENT HISTORY: right transmetatarsal amputation on 03/25/2023  PAIN:  Are you having pain? No Does frequently have pain but is well-controlled   PRECAUTIONS: Fall  RED FLAGS: None   WEIGHT BEARING RESTRICTIONS: No  FALLS: Has patient fallen in last 6 months? No  LIVING ENVIRONMENT: Lives with: lives with their partner Lives in: House/apartment Stairs: No Has following equipment at home: Single point cane and suction cup grab bars   PLOF: Independent  PATIENT GOALS: "My main goal is this swelling"   OBJECTIVE:    COGNITION: Overall cognitive status: Within functional limits for tasks assessed   SENSATION: Phantom sensation of R foot  EDEMA: Lymphedema bilaterally    POSTURE: rounded shoulders and forward head  LOWER EXTREMITY MMT:  Tested in seated position   MMT Right Eval Left Eval  Hip flexion 4+ 4  Hip extension    Hip abduction 4+ 4+  Hip adduction 4+ 4+  Hip internal rotation    Hip external rotation    Knee flexion 4+ 4+  Knee extension 4+ 4+  Ankle dorsiflexion  4  Ankle plantarflexion    Ankle inversion    Ankle eversion    (Blank rows = not tested)  BED MOBILITY:  Independent per pt   TRANSFERS: Assistive device utilized: None  Sit to stand: Modified independence Stand to sit: Modified independence   GAIT: Gait pattern: step through pattern, decreased arm swing- Right, decreased arm swing- Left, decreased hip/knee flexion- Right, decreased hip/knee flexion- Left, decreased ankle dorsiflexion- Right, decreased ankle dorsiflexion- Left, wide BOS, and poor foot clearance- Right Distance walked: Various clinic distances  Assistive device utilized: None Level of assistance: Modified independence Comments: Pt ambulates w/waddle-like pattern due to lymphedema in BLEs (RLE>LLE)  FUNCTIONAL TESTS:   [facility] PT Assessment - 04/16/23 1305       Transfers   Five time sit to stand comments  18.75s   BUE support on first 2 reps then no UE support     Ambulation/Gait   Gait velocity 32.8' over 12.22s = 2.68 ft/s no AD              TODAY'S TREATMENT:         Next Session                                                                                                                           PATIENT EDUCATION: Education details: POC, eval findings, importance of calling insurance company to determine if he is covered for PT. If he is not, will plan on cancelling appointments as he will be self-pay. Encouraged pt to ask for lymphedema PT referral from Dr.  Alvis Lemmings at appointment next week to receive education on lymphedema management. Provided pt w/forms to fill out to obtain Handicap parking placard.  Person educated: Patient Education method: Explanation, Demonstration, and Handouts Education comprehension: verbalized understanding  HOME EXERCISE PROGRAM: To be established   GOALS: Goals reviewed with patient? Yes  STG=LTG DUE TO POC LENGTH    LONG TERM GOALS: Target date: 05/14/2023    Pt will be independent with final HEP for improved strength, balance, lymphedema management and gait.  Baseline: not established on eval  Goal status: INITIAL  2.  FGA to be assessed and LTG updated  Baseline:  Goal status: INITIAL  3.  Pt will improve gait velocity to at least 2.9 ft/s for improved gait efficiency and independence   Baseline: 2.68 ft/s no AD  Goal status: INITIAL  ASSESSMENT:  CLINICAL IMPRESSION: Patient is a 53 year old male referred to Neuro [facility] for weakness and osteomyelitis of right foot. Pt's PMH is significant for: T2DM, peripheral neuropathy, atrial fibrillation, HTN, and GERD. The following  deficits were present during the exam: lymphedema, phantom sensation of R foot, decreased strength and decreased activity tolerance. Balance to be further assessed to determine fall risk. Pt would benefit from skilled PT to address these impairments and functional limitations to maximize functional mobility independence.    OBJECTIVE IMPAIRMENTS: Abnormal gait, decreased activity tolerance, decreased endurance, decreased knowledge of condition, decreased mobility, difficulty walking, decreased strength, increased edema, impaired sensation, and pain  ACTIVITY LIMITATIONS: standing, stairs, bathing, and locomotion level  PARTICIPATION LIMITATIONS: cleaning, community activity, and occupation  PERSONAL FACTORS: Education, Past/current experiences, and 1 comorbidity: bilateral lymphedema  are also affecting patient's functional  outcome.   REHAB POTENTIAL: Good  CLINICAL DECISION MAKING: Stable/uncomplicated  EVALUATION COMPLEXITY: Low  PLAN:  PT FREQUENCY: 1x/week  PT DURATION: 4 weeks  PLANNED INTERVENTIONS: Therapeutic exercises, Therapeutic activity, Neuromuscular re-education, Balance training, Gait training, Patient/Family education, Self Care, Joint mobilization, Stair training, Prosthetic training, DME instructions, Aquatic Therapy, Spinal mobilization, Manual therapy, and Re-evaluation  PLAN FOR NEXT SESSION: FGA and update goal. HEP for BLE strength, ROM and balance. Insurance? Referral to lymphedema specialist?    Jill Alexanders Tyde Lamison, PT 04/16/2023, 3:16 PM

## 2023-04-16 NOTE — Progress Notes (Deleted)
Subjective:    Patient ID: Taylor Hughes, male    DOB: Jun 08, 1971, 52 y.o.   MRN: [id number]  HPI   52 y.o. 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    FEW BACTEROIDES FRAGILIS BETA LACTAMASE POSITIVE FEW SCHAALIA ODONTOLYTICUS Standardized susceptibility testing for this organism is not available. FEW STREPTOCOCCUS INTERMEDIUS   On IV Unasyn but then ultimately completing 2 weeks of IV therapy was changed over to high-dose amoxicillin along with metronidazole to complete a total of 6 weeks of therapy.  Note the SCHAALIA ODONTOLYTICUS is an actionmyces species need to be treated with a tract course of amoxicillin with at least 6 months of active therapy.    Past Medical History:  Diagnosis Date   Atrial fibrillation (HCC)    Diabetes mellitus without complication (HCC)    Type 2   GERD (gastroesophageal reflux disease)    Wears glasses    Wound, open, foot    left diabetic     Past Surgical History:  Procedure Laterality Date   AMPUTATION Left 08/20/2018   Procedure: LEFT FOOT IRRIGATON AND DEBRIDEMENT, 5TH RAY AMPUTATION;  Surgeon: Nadara Mustard, MD;  Location: [facility] OR;  Service: Orthopedics;  Laterality: Left;   AMPUTATION Right 03/25/2023   Procedure: RIGHT TRANSMETATARSAL AMPUTATION;  Surgeon: Nadara Mustard, MD;  Location: [facility] OR;  Service: Orthopedics;  Laterality: Right;   APPLICATION OF A-CELL OF CHEST/ABDOMEN N/A 05/30/2019   Procedure: Application Of A-Cell Of Chest;  Surgeon: Corliss Skains, MD;  Location: [facility] OR;  Service: Cardiothoracic;  Laterality: N/A;   APPLICATION OF WOUND VAC N/A 05/30/2019   Procedure: Application Of Wound Vac;  Surgeon: Corliss Skains, MD;  Location: [facility] OR;  Service: Cardiothoracic;  Laterality: N/A;   FINGER SURGERY Right 2016   I&D  small finger   I & D EXTREMITY Left 07/30/2018    Procedure: IRRIGATION AND DEBRIDEMENT LEFT FOOT WITH POSSIBLE AMPUTATION OF FIFTH TOE;  Surgeon: Kathryne Hitch, MD;  Location: [facility];  Service: Orthopedics;  Laterality: Left;   INCISION AND DRAINAGE ABSCESS Left 01/19/2019   Procedure: INCISION AND DRAINAGE ABSCESS upper chest;  Surgeon: Christia Reading, MD;  Location: [facility] OR;  Service: ENT;  Laterality: Left;   IRRIGATION AND DEBRIDEMENT STERNOCLAVICULAR JOINT-STERNUM AND RIBS N/A 05/30/2019   Procedure: IRRIGATION AND DEBRIDEMENT OF STERNOCLAVICULAR JOINT-STERNUM AND RIBS ;  Surgeon: Corliss Skains, MD;  Location: [facility] OR;  Service: Cardiothoracic;  Laterality: N/A;   MINOR IRRIGATION AND DEBRIDEMENT OF WOUND N/A 11/22/2018   Procedure: INCISION AND DRAINAGE OF NECK ABSCESS;  Surgeon: Christia Reading, MD;  Location: [facility];  Service: ENT;  Laterality: N/A;    Family History  Problem Relation Age of Onset   Diabetes Mother       Social History   Socioeconomic History   Marital status: Divorced    Spouse name: Not on file   Number of children: Not on file   Years of education: Not on file   Highest education level: Not on file  Occupational History   Not on file  Tobacco Use   Smoking status: Never   Smokeless tobacco: Never  Vaping Use   Vaping status: Never Used  Substance and Sexual Activity   Alcohol use: No    Alcohol/week: 0.0 standard drinks of alcohol   Drug use: No   Sexual activity: Not on file  Other Topics Concern   Not on file  Social History Narrative   Not on file   Social Determinants of Health   Financial Resource Strain: Not on file  Food Insecurity: No Food Insecurity (03/23/2023)   Hunger Vital Sign    Worried About Running Out of Food in the Last Year: Never true    Ran Out of Food in the Last Year: Never true  Transportation Needs: No Transportation Needs (03/23/2023)   PRAPARE - Administrator, Civil Service (Medical): No    Lack of Transportation (Non-Medical): No  Physical  Activity: Not on file  Stress: Not on file  Social Connections: Not on file    No Known Allergies   Current Outpatient Medications:    Lactobacillus (ACIDOPHILUS) CAPS capsule, Take 2 capsules by mouth daily., Disp: 60 capsule, Rfl: 1   albuterol (VENTOLIN HFA) 108 (90 Base) MCG/ACT inhaler, Inhale 2 puffs into the lungs every 6 (six) hours as needed for wheezing or shortness of breath., Disp: 6.7 g, Rfl: 0   amoxicillin (AMOXIL) 500 MG capsule, Take 2 capsules (1,000 mg total) by mouth 3 (three) times daily for 85 doses., Disp: 170 capsule, Rfl: 0   aspirin EC 81 MG tablet, Take 1 tablet (81 mg total) by mouth daily. Swallow whole., Disp: , Rfl:    atorvastatin (LIPITOR) 20 MG tablet, Take 1 tablet (20 mg total) by mouth daily. (Patient not taking: Reported on 03/25/2023), Disp: 90 tablet, Rfl: 1   Blood Glucose Monitoring Suppl (TRUE METRIX METER) w/Device KIT, Use to check blood sugar three times daily., Disp: 1 kit, Rfl: 0   buPROPion (WELLBUTRIN XL) 150 MG 24 hr tablet, Take 1 tablet (150 mg total) by mouth daily., Disp: 30 tablet, Rfl: 1   folic acid (FOLVITE) 1 MG tablet, Take 1 tablet (1 mg total) by mouth daily., Disp: 30 tablet, Rfl: 1   furosemide (LASIX) 20 MG tablet, Take 3 tablets (60 mg total) by mouth daily for 5 days, THEN continue 2 tablets (40 mg total) daily thereafter, Disp: 90 tablet, Rfl: 0   gabapentin (NEURONTIN) 300 MG capsule, Take 1 capsule (300 mg total) by mouth 2 (two) times daily. (Patient taking differently: Take 300 mg by mouth 3 (three) times daily.), Disp: 180 capsule, Rfl: 1   glucose blood (TRUE METRIX BLOOD GLUCOSE TEST) test strip, Use as directed, Disp: 100 strip, Rfl: 2   Insulin Glargine (BASAGLAR KWIKPEN) 100 UNIT/ML, Inject 10 Units into the skin daily., Disp: , Rfl:    insulin lispro (HUMALOG KWIKPEN) 100 UNIT/ML KwikPen, Inject 0-12 units into the skin three times daily with meals. Per sliding scale., Disp: 12 mL, Rfl: 3   Insulin Pen Needle 32G X 6  MM MISC, USE AS DIRECTED, Disp: 100 each, Rfl: 0   metoprolol tartrate (LOPRESSOR) 25 MG tablet, Take 1 tablet (25 mg total) by mouth 2 (two) times daily., Disp: 60 tablet, Rfl: 1   metroNIDAZOLE (FLAGYL) 500 MG tablet, Take 1 tablet (500 mg total) by mouth 3 (three) times daily for 85 doses., Disp: 85 tablet, Rfl: 0   omeprazole (PRILOSEC) 40 MG capsule, Take 1 capsule (40 mg total) by mouth daily., Disp: 90 capsule, Rfl: 1   oxyCODONE (OXY IR/ROXICODONE) 5 MG immediate release tablet, Take 1 tablet (5 mg total) by mouth every 6 (six) hours as needed for moderate pain or severe pain., Disp: 30 tablet, Rfl: 0   sildenafil (VIAGRA) 50 MG tablet, Take 1 tablet (50 mg total)  by mouth daily as needed for erectile dysfunction. At least 24 hours between doses, Disp: 10 tablet, Rfl: 3   TRUEplus Lancets 28G MISC, Use to check blood sugar three times daily., Disp: 100 each, Rfl: 11   valsartan-hydrochlorothiazide (DIOVAN-HCT) 160-25 MG tablet, Take 1 tablet by mouth daily. (Patient not taking: Reported on 03/25/2023), Disp: 90 tablet, Rfl: 1    Review of Systems     Objective:   Physical Exam        Assessment & Plan:

## 2023-04-17 ENCOUNTER — Other Ambulatory Visit: Payer: Self-pay

## 2023-04-17 ENCOUNTER — Encounter: Payer: Self-pay | Admitting: Pharmacist

## 2023-04-21 ENCOUNTER — Other Ambulatory Visit: Payer: Self-pay

## 2023-04-22 ENCOUNTER — Other Ambulatory Visit: Payer: Self-pay

## 2023-04-23 ENCOUNTER — Other Ambulatory Visit: Payer: Self-pay | Admitting: Family

## 2023-04-23 ENCOUNTER — Other Ambulatory Visit: Payer: Self-pay

## 2023-04-23 ENCOUNTER — Telehealth: Payer: Self-pay | Admitting: Orthopedic Surgery

## 2023-04-23 ENCOUNTER — Ambulatory Visit: Payer: PRIVATE HEALTH INSURANCE | Admitting: Physical Therapy

## 2023-04-23 NOTE — Progress Notes (Deleted)
Cardiology Office Note:    Date:  04/23/2023  ID:  [name], DOB 18-Jun-1971, MRN [id number] PCP: Hoy Register, MD  [facility] Providers Cardiologist:  None { Click to update primary MD,subspecialty MD or APP then REFRESH:1}    {Click to Open Review  :1}   Patient Profile:      Coronary artery Ca2+ on CT (03/2023) Diabetes mellitus  Hypertension  Hyperlipidemia  Tachycardia   TTE 03/24/2023: EF 70-75, no RWMA, hyperdynamic RV, severe RVE, RAP 3 (reviewed by Dr. Mayford Knife - RV not felt to be severely enlarged, normal RVF)      {      :1}   History of Present Illness:  Discussed the use of AI scribe software for clinical note transcription with the patient, who gave verbal consent to proceed.  History of Present Illness          Taylor Hughes is a 52 y.o. male who returns for post hospital follow up.  He was admitted 8/11-8/27 with right foot osteomyelitis.  He underwent right transmetatarsal amputation.  He was seen by cardiology for sinus tachycardia with a question of SVT.  He was seen by Dr. Mayford Knife and it was felt that his rhythm did not likely represent SVT.  His heart rate improved after his amputation.  Echocardiogram demonstrated normal EF.  TSH was normal.  CT was negative for pulmonary embolism.  V/Q was low risk for PE.  Outpatient monitor was ordered but is currently pending.  *** ROS: ***    Studies Reviewed:       *** Risk Assessment/Calculations:   {Does this patient have ATRIAL FIBRILLATION?:[phone]} No BP recorded.  {Refresh Note OR Click here to enter BP  :1}***       Physical Exam:   VS:  There were no vitals taken for this visit.   Wt Readings from Last 3 Encounters:  03/28/23 (!) 323 lb 3.1 oz (146.6 kg)  01/19/23 299 lb 9.6 oz (135.9 kg)  09/08/22 293 lb 6.4 oz (133.1 kg)    Physical Exam***     Assessment and Plan:  No problem-specific Assessment & Plan notes found for this encounter. Assessment and Plan             *** {      :1}     {Are you ordering a CV Procedure (e.g. stress test, cath, DCCV, TEE, etc)?   Press F2        :[id number]}  Dispo:  No follow-ups on file.  Signed, Tereso Newcomer, PA-C

## 2023-04-23 NOTE — Telephone Encounter (Signed)
Patient called to get a refill on Oxycodone for pain. CB#[phone]

## 2023-04-24 ENCOUNTER — Other Ambulatory Visit: Payer: Self-pay

## 2023-04-24 ENCOUNTER — Ambulatory Visit: Payer: PRIVATE HEALTH INSURANCE | Attending: Physician Assistant | Admitting: Physician Assistant

## 2023-04-24 DIAGNOSIS — R000 Tachycardia, unspecified: Secondary | ICD-10-CM

## 2023-04-24 MED ORDER — OXYCODONE HCL 5 MG PO TABS
5.0000 mg | ORAL_TABLET | Freq: Four times a day (QID) | ORAL | 0 refills | Status: DC | PRN
  Filled 2023-04-24: qty 30, 8d supply, fill #0

## 2023-04-24 NOTE — Telephone Encounter (Signed)
sent 

## 2023-04-27 ENCOUNTER — Other Ambulatory Visit: Payer: Self-pay

## 2023-04-27 ENCOUNTER — Ambulatory Visit: Payer: PRIVATE HEALTH INSURANCE | Attending: Family Medicine | Admitting: Family Medicine

## 2023-04-27 ENCOUNTER — Encounter: Payer: Self-pay | Admitting: Family Medicine

## 2023-04-27 VITALS — BP 157/88 | HR 83 | Ht 71.0 in | Wt 298.8 lb

## 2023-04-27 DIAGNOSIS — I890 Lymphedema, not elsewhere classified: Secondary | ICD-10-CM

## 2023-04-27 DIAGNOSIS — E669 Obesity, unspecified: Secondary | ICD-10-CM

## 2023-04-27 DIAGNOSIS — E1142 Type 2 diabetes mellitus with diabetic polyneuropathy: Secondary | ICD-10-CM

## 2023-04-27 DIAGNOSIS — E1159 Type 2 diabetes mellitus with other circulatory complications: Secondary | ICD-10-CM | POA: Diagnosis not present

## 2023-04-27 DIAGNOSIS — I152 Hypertension secondary to endocrine disorders: Secondary | ICD-10-CM

## 2023-04-27 DIAGNOSIS — Z1211 Encounter for screening for malignant neoplasm of colon: Secondary | ICD-10-CM

## 2023-04-27 DIAGNOSIS — Z794 Long term (current) use of insulin: Secondary | ICD-10-CM

## 2023-04-27 DIAGNOSIS — Z89431 Acquired absence of right foot: Secondary | ICD-10-CM

## 2023-04-27 DIAGNOSIS — Z6841 Body Mass Index (BMI) 40.0 and over, adult: Secondary | ICD-10-CM

## 2023-04-27 MED ORDER — VALSARTAN 80 MG PO TABS
80.0000 mg | ORAL_TABLET | Freq: Every day | ORAL | 1 refills | Status: DC
  Filled 2023-04-27: qty 90, 90d supply, fill #0

## 2023-04-27 NOTE — Progress Notes (Signed)
Subjective:  Patient ID: Taylor Hughes, male    DOB: 1971-07-31  Age: 52 y.o. MRN: [id number]  CC: Medical Management of Chronic Issues   HPI Vaughan Mowat is a 52 y.o. year old male with a history of Type 2 diabetes mellitus (A1c of 9.8), L fifth toe ray amputation, hypertension, hyperlipidemia, Lymphedema, right foot transmetatarsal amputation (in 03/2023)    Interval History: Discussed the use of AI scribe software for clinical note transcription with the patient, who gave verbal consent to proceed.  He presents for a follow-up visit.  At his most recent hospitalization, during which their blood pressure medication was switched from Valsartan hydrochlorothiazide to metoprolol due to an episode of SVT and low blood pressure in the setting of sepsis. His blood pressure is elevated and he endorses taking metoprolol 30 minutes prior to this appointment. He has been monitoring his blood glucose levels at home, which have ranged from 89 fasting to 200 random. He reports an episode of hypoglycemia with a blood glucose level of 54, which was associated with jitteriness and shakiness. He denies skipping meals and is currently on 10 units of Lantus insulin.  Since his recent foot surgery and is currently wearing a boot. He is experiencing pain, which is managed by pain medication prescribed by their orthopedic surgeon. He also reports lymphedema in his legs, which has not improved since their hospital discharge. He is unable to stand for more than 15 minutes due to leg swelling. He is currently unemployed and has applied for disability.        Past Medical History:  Diagnosis Date   Atrial fibrillation (HCC)    Diabetes mellitus without complication (HCC)    Type 2   GERD (gastroesophageal reflux disease)    Wears glasses    Wound, open, foot    left diabetic     Past Surgical History:  Procedure Laterality Date   AMPUTATION Left 08/20/2018   Procedure: LEFT FOOT IRRIGATON AND DEBRIDEMENT,  5TH RAY AMPUTATION;  Surgeon: Nadara Mustard, MD;  Location: [facility] OR;  Service: Orthopedics;  Laterality: Left;   AMPUTATION Right 03/25/2023   Procedure: RIGHT TRANSMETATARSAL AMPUTATION;  Surgeon: Nadara Mustard, MD;  Location: [facility] OR;  Service: Orthopedics;  Laterality: Right;   APPLICATION OF A-CELL OF CHEST/ABDOMEN N/A 05/30/2019   Procedure: Application Of A-Cell Of Chest;  Surgeon: Corliss Skains, MD;  Location: [facility] OR;  Service: Cardiothoracic;  Laterality: N/A;   APPLICATION OF WOUND VAC N/A 05/30/2019   Procedure: Application Of Wound Vac;  Surgeon: Corliss Skains, MD;  Location: [facility] OR;  Service: Cardiothoracic;  Laterality: N/A;   FINGER SURGERY Right 2016   I&D  small finger   I & D EXTREMITY Left 07/30/2018   Procedure: IRRIGATION AND DEBRIDEMENT LEFT FOOT WITH POSSIBLE AMPUTATION OF FIFTH TOE;  Surgeon: Kathryne Hitch, MD;  Location: [facility] ORS;  Service: Orthopedics;  Laterality: Left;   INCISION AND DRAINAGE ABSCESS Left 01/19/2019   Procedure: INCISION AND DRAINAGE ABSCESS upper chest;  Surgeon: Christia Reading, MD;  Location: [facility] OR;  Service: ENT;  Laterality: Left;   IRRIGATION AND DEBRIDEMENT STERNOCLAVICULAR JOINT-STERNUM AND RIBS N/A 05/30/2019   Procedure: IRRIGATION AND DEBRIDEMENT OF STERNOCLAVICULAR JOINT-STERNUM AND RIBS ;  Surgeon: Corliss Skains, MD;  Location: [facility] OR;  Service: Cardiothoracic;  Laterality: N/A;   MINOR IRRIGATION AND DEBRIDEMENT OF WOUND N/A 11/22/2018   Procedure: INCISION AND DRAINAGE OF NECK ABSCESS;  Surgeon: Christia Reading, MD;  Location: [facility] ORS;  Service: ENT;  Laterality: N/A;    Family History  Problem Relation Age of Onset   Diabetes Mother     Social History   Socioeconomic History   Marital status: Divorced    Spouse name: Not on file   Number of children: Not on file   Years of education: Not on file   Highest education level: Not on file  Occupational History   Not on file  Tobacco Use   Smoking status: Never    Smokeless tobacco: Never  Vaping Use   Vaping status: Never Used  Substance and Sexual Activity   Alcohol use: No    Alcohol/week: 0.0 standard drinks of alcohol   Drug use: No   Sexual activity: Not on file  Other Topics Concern   Not on file  Social History Narrative   Not on file   Social Determinants of Health   Financial Resource Strain: Not on file  Food Insecurity: No Food Insecurity (03/23/2023)   Hunger Vital Sign    Worried About Running Out of Food in the Last Year: Never true    Ran Out of Food in the Last Year: Never true  Transportation Needs: No Transportation Needs (03/23/2023)   PRAPARE - Administrator, Civil Service (Medical): No    Lack of Transportation (Non-Medical): No  Physical Activity: Not on file  Stress: Not on file  Social Connections: Not on file    No Known Allergies  Outpatient Medications Prior to Visit  Medication Sig Dispense Refill   albuterol (VENTOLIN HFA) 108 (90 Base) MCG/ACT inhaler Inhale 2 puffs into the lungs every 6 (six) hours as needed for wheezing or shortness of breath. 6.7 g 0   amoxicillin (AMOXIL) 500 MG capsule Take 2 capsules (1,000 mg total) by mouth 3 (three) times daily for 85 doses. 170 capsule 0   aspirin EC 81 MG tablet Take 1 tablet (81 mg total) by mouth daily. Swallow whole.     atorvastatin (LIPITOR) 20 MG tablet Take 1 tablet (20 mg total) by mouth daily. 90 tablet 1   Blood Glucose Monitoring Suppl (TRUE METRIX METER) w/Device KIT Use to check blood sugar three times daily. 1 kit 0   buPROPion (WELLBUTRIN XL) 150 MG 24 hr tablet Take 1 tablet (150 mg total) by mouth daily. 30 tablet 1   folic acid (FOLVITE) 1 MG tablet Take 1 tablet (1 mg total) by mouth daily. 30 tablet 1   furosemide (LASIX) 20 MG tablet Take 3 tablets (60 mg total) by mouth daily for 5 days, THEN continue 2 tablets (40 mg total) daily thereafter 90 tablet 0   gabapentin (NEURONTIN) 300 MG capsule Take 1 capsule (300 mg total) by  mouth 2 (two) times daily. (Patient taking differently: Take 300 mg by mouth 3 (three) times daily.) 180 capsule 1   glucose blood (TRUE METRIX BLOOD GLUCOSE TEST) test strip Use as directed 100 strip 2   Insulin Glargine (BASAGLAR KWIKPEN) 100 UNIT/ML Inject 10 Units into the skin daily.     insulin lispro (HUMALOG KWIKPEN) 100 UNIT/ML KwikPen Inject 0-12 units into the skin three times daily with meals. Per sliding scale. 12 mL 3   Insulin Pen Needle 32G X 6 MM MISC USE AS DIRECTED 100 each 0   Lactobacillus (ACIDOPHILUS) CAPS capsule Take 2 capsules by mouth daily. 60 capsule 1   metoprolol tartrate (LOPRESSOR) 25 MG tablet Take 1 tablet (25 mg total) by mouth 2 (two) times daily. 60  tablet 1   metroNIDAZOLE (FLAGYL) 500 MG tablet Take 1 tablet (500 mg total) by mouth 3 (three) times daily for 85 doses. 85 tablet 0   omeprazole (PRILOSEC) 40 MG capsule Take 1 capsule (40 mg total) by mouth daily. 90 capsule 1   oxyCODONE (OXY IR/ROXICODONE) 5 MG immediate release tablet Take 1 tablet (5 mg total) by mouth every 6 (six) hours as needed for moderate pain or severe pain. 30 tablet 0   sildenafil (VIAGRA) 50 MG tablet Take 1 tablet (50 mg total) by mouth daily as needed for erectile dysfunction. At least 24 hours between doses 10 tablet 3   TRUEplus Lancets 28G MISC Use to check blood sugar three times daily. 100 each 11   valsartan-hydrochlorothiazide (DIOVAN-HCT) 160-25 MG tablet Take 1 tablet by mouth daily. 90 tablet 1   No facility-administered medications prior to visit.     ROS Review of Systems  Constitutional:  Negative for activity change and appetite change.  HENT:  Negative for sinus pressure and sore throat.   Respiratory:  Negative for chest tightness, shortness of breath and wheezing.   Cardiovascular:  Negative for chest pain and palpitations.  Gastrointestinal:  Negative for abdominal distention, abdominal pain and constipation.  Genitourinary: Negative.   Musculoskeletal:  Negative.   Psychiatric/Behavioral:  Negative for behavioral problems and dysphoric mood.     Objective:  BP (!) 157/88   Pulse 83   Ht 5\' 11"  (1.803 m)   Wt 298 lb 12.8 oz (135.5 kg)   SpO2 100%   BMI 41.67 kg/m      04/27/2023   10:55 AM 04/27/2023    9:23 AM 04/07/2023    7:18 AM  BP/Weight  Systolic BP 157 155 164  Diastolic BP 88 88 94  Wt. (Lbs)  298.8   BMI  41.67 kg/m2       Physical Exam Constitutional:      Appearance: He is well-developed. He is obese.  Cardiovascular:     Rate and Rhythm: Normal rate.     Heart sounds: Normal heart sounds. No murmur heard. Pulmonary:     Effort: Pulmonary effort is normal.     Breath sounds: Normal breath sounds. No wheezing or rales.  Chest:     Chest wall: No tenderness.  Abdominal:     General: Bowel sounds are normal. There is no distension.     Palpations: Abdomen is soft. There is no mass.     Tenderness: There is no abdominal tenderness.  Musculoskeletal:        General: Normal range of motion.     Right lower leg: Edema (lymphedema) present.     Left lower leg: Edema (lymphedema) present.     Comments: Right foot transmetatarsal amputation  Neurological:     Mental Status: He is alert and oriented to person, place, and time.  Psychiatric:        Mood and Affect: Mood normal.        Latest Ref Rng & Units 04/07/2023    3:23 AM 04/06/2023    9:31 PM 04/06/2023    3:38 AM  CMP  Glucose 70 - 99 mg/dL 096  045  52   BUN 6 - 20 mg/dL 15  14  13    Creatinine 0.61 - 1.24 mg/dL 4.09  8.11  9.14   Sodium 135 - 145 mmol/L 136  137  137   Potassium 3.5 - 5.1 mmol/L 3.9  4.0  3.6   Chloride 98 -  111 mmol/L 105  100  105   CO2 22 - 32 mmol/L 24  24  23    Calcium 8.9 - 10.3 mg/dL 8.7  8.8  8.6     Lipid Panel     Component Value Date/Time   CHOL 173 05/09/2022 1359   TRIG 50 05/09/2022 1359   HDL 71 05/09/2022 1359   LDLCALC 92 05/09/2022 1359    CBC    Component Value Date/Time   WBC 4.8 04/07/2023  0323   RBC 3.07 (L) 04/07/2023 0323   HGB 8.2 (L) 04/07/2023 0323   HCT 26.6 (L) 04/07/2023 0323   PLT 453 (H) 04/07/2023 0323   MCV 86.6 04/07/2023 0323   MCH 26.7 04/07/2023 0323   MCHC 30.8 04/07/2023 0323   RDW 18.2 (H) 04/07/2023 0323   LYMPHSABS 1.6 04/01/2023 0338   MONOABS 0.7 04/01/2023 0338   EOSABS 0.2 04/01/2023 0338   BASOSABS 0.0 04/01/2023 0338    Lab Results  Component Value Date   HGBA1C 9.8 (H) 03/23/2023    Assessment & Plan:      Type 2 Diabetes Mellitus A1c improved from 11.1 to 9.8, but still elevated. Home glucose readings range from 89-200. One recent episode of hypoglycemia (54) after insulin administration. -Continue current insulin regimen (Lantus 10 units daily). -Check A1c in 3 months. -Encourage consistent meal intake to prevent hypoglycemia. -Consider initiation of GLP-1 RA at next visit  Hypertension Blood pressure elevated despite Metoprolol use. Patient reports that blood pressure was managed with Valsartan-Hydrochlorothiazide in the past, but was switched to Metoprolol during a recent hospitalization due to infection and tachycardia. -Add Diovan 80mg  daily to current Metoprolol regimen; discontinue valsartan/HCTZ -Recheck blood pressure in 2 weeks. -Advise low sodium diet.  Lymphedema Chronic leg swelling, causing functional impairment. Patient reports difficulty standing for more than 15 minutes. -Continue current management strategies (elevation, compression when appropriate). -Consider referral to physical therapy or lymphedema specialist for further management.  Right foot transmetatarsal amputation Patient currently in a boot following recent surgery. Reports pain managed by orthopedic surgeon. -Continue current pain management plan as directed by orthopedic surgeon. -Follow up with surgeon as scheduled. -He is working on obtaining stability  General Health Maintenance -Colonoscopy referral due to age. -Discuss potential  initiation of weight loss medications (Ozempic or Mounjaro) at next visit.          Meds ordered this encounter  Medications   valsartan (DIOVAN) 80 MG tablet    Sig: Take 1 tablet (80 mg total) by mouth daily.    Dispense:  90 tablet    Refill:  1    Discontinue valsartan/HCTZ    Follow-up: Return in about 10 days (around 05/07/2023) for Blood Pressure follow-up with PCP, ok to double book.       Hoy Register, MD, FAAFP. [facility] [place], [place] [phone]   04/27/2023, 11:14 AM

## 2023-04-27 NOTE — Patient Instructions (Signed)
Lymphedema  Lymphedema is swelling that happens when an abnormal amount of lymph collects in the soft tissues under your skin. Lymph is fluid that moves through your lymphatic system. This system: Is part of your body's defense system, also called your immune system. Filters germs and waste from tissues in your body to your bloodstream. Lymphedema happens when your lymphatic system is blocked. This keeps lymph from draining as it should and leads to swelling. What are the causes? The cause of lymphedema depends on which type you have. Primary lymphedema is when you're born without lymph vessels or with lymph vessels that aren't normal. Secondary lymphedema is more common. It happens when lymph vessels are blocked or damaged from: Infection. Injury. Radiation therapy. Cancer. Scar tissue that forms. Surgery. What are the signs or symptoms? A swollen arm, leg, feet, toes, or fingers. A heavy or tight feeling in the swollen area. Skin that turns red near the swollen area. Not being able to move your arm or leg. Your arm or leg is sensitive to touch. Discomfort in your arm or leg. How is this diagnosed? Lymphedema may be diagnosed based on: Your symptoms and medical history. A physical exam. Bioimpedance spectroscopy. This test uses painless electrical currents. They help measure fluid levels in your body. Imaging tests, such as: MRI or CT scan. Duplex ultrasound. This test uses sound waves to make pictures on a screen. The pictures show your blood vessels and blood flow. Lymphoscintigraphy. In this test, a low dose of radioactive substance is given through a needle that goes through your skin. The substance traces the flow of lymph through your lymph vessels. Lymphangiography. In this test, a contrast dye is put into your lymph vessel. The dye helps show if the vessel is blocked. How is this treated?  If another condition is causing your lymphedema, that condition will be treated.  For example, antibiotics may be used to treat infection. Treatment for lymphedema depends on the cause. Treatment may include: Complete decongestive therapy (CDT). This lowers fluid buildup. CDT includes: Pressure (compression) wrapping of the area. Manual lymph drainage. This helps lymph drain out of your arm or leg. Certain exercises. These help fluid move out of your arm or leg. Compression. This puts pressure on your arm or leg to lower swelling. It includes: Compression stockings or sleeves. Special bandage wraps. Surgery. This is normally done only for severe cases that don't get better with other treatments. Follow these instructions at home: Self-care Your swollen area is more likely to get hurt or infected. To help prevent infection: Keep the area clean and dry. Use creams or lotions that your health care provider says are okay. These keep your skin moist. Protect your skin from cuts. Use gloves when you cook or garden. Do not walk barefoot. If you shave the area, use an Neurosurgeon. Do not wear tight clothes, shoes, or jewelry. Eat a healthy diet. Eat a lot of fruits and vegetables. Activity Do exercises as told by your provider. Do not sit with your legs crossed. When you can, keep the swollen leg or foot raised above the level of your heart. Avoid using an arm with lymphedema to carry things. General instructions Wear compression stockings or sleeves as told by your provider. Note any changes in size of the swollen arm or leg. You may be told to measure it at set times and track the results. Take over-the-counter and prescription medicines only as told by your provider. If you were prescribed antibiotics, use them  as told by your provider. Do not stop using the antibiotic even if you start to feel better or if your condition improves. Do not use heating pads or ice packs on the swollen area. Avoid having your swollen arm or leg used for: Blood draws. IVs. Blood  pressure checks. Contact a health care provider if: You get new swelling in your arm or leg all of a sudden. Fluid leaks from the skin of your swollen arm or leg. You have a cut that doesn't heal. The swollen area hurts or turns red. You get purple spots, a rash, blisters, or sores on your swollen arm or leg. You have a fever or chills. This information is not intended to replace advice given to you by your health care provider. Make sure you discuss any questions you have with your health care provider. Document Revised: 10/22/2022 Document Reviewed: 10/22/2022 Elsevier Patient Education  2024 [employer].

## 2023-04-28 ENCOUNTER — Other Ambulatory Visit: Payer: Self-pay

## 2023-04-28 ENCOUNTER — Other Ambulatory Visit (HOSPITAL_BASED_OUTPATIENT_CLINIC_OR_DEPARTMENT_OTHER): Payer: Self-pay

## 2023-04-28 ENCOUNTER — Telehealth: Payer: Self-pay | Admitting: Orthopedic Surgery

## 2023-04-28 NOTE — Telephone Encounter (Signed)
Pt called in stating he is having phantom pains and actual pain so by night time he is in severe pain and he is having to take 3 every 4 hours of Hydrocodone and he has been taking them since Friday and no real pain relief and his legs are swollen and he would like to know what can be done about it.Pt had toes amputated last month, pt also needs follow up ASAP

## 2023-04-28 NOTE — Telephone Encounter (Signed)
I called pt to offer an appt tomorrow for eval. He is s/p a right TMA on 03/25/2023 and needed eval for suture removal. Had advised at last OV to be NWTB. Pt states that he has a lot of appts and he is not able to remain off of his foot. He states that his insurance will not cover a walker or crutches and that if we do not give that to him he has to walk on his foot. He did not initially want to make an appt for eval stating that if he did not receive medication that it would be a waste of his time to come. I advised that he is only 4 weeks out from surgery and that he needs eval. Pt advised that he will come at 3:45

## 2023-04-29 ENCOUNTER — Encounter: Payer: Self-pay | Admitting: Pharmacist

## 2023-04-29 ENCOUNTER — Encounter: Payer: PRIVATE HEALTH INSURANCE | Admitting: Family

## 2023-04-30 ENCOUNTER — Ambulatory Visit: Payer: PRIVATE HEALTH INSURANCE | Admitting: Physical Therapy

## 2023-04-30 ENCOUNTER — Telehealth: Payer: Self-pay | Admitting: Physical Therapy

## 2023-04-30 NOTE — Telephone Encounter (Signed)
This is patient's second no show; informed on when next appointment is. Made aware that would be a D/C if had another no show/same day cancellation due to policy. Patient verbalized understanding.   Maryruth Eve, PT, DPT

## 2023-05-01 ENCOUNTER — Other Ambulatory Visit: Payer: Self-pay

## 2023-05-01 ENCOUNTER — Ambulatory Visit (INDEPENDENT_AMBULATORY_CARE_PROVIDER_SITE_OTHER): Payer: PRIVATE HEALTH INSURANCE | Admitting: Family

## 2023-05-01 ENCOUNTER — Encounter: Payer: Self-pay | Admitting: Family

## 2023-05-01 DIAGNOSIS — Z89431 Acquired absence of right foot: Secondary | ICD-10-CM

## 2023-05-01 DIAGNOSIS — L02611 Cutaneous abscess of right foot: Secondary | ICD-10-CM

## 2023-05-01 DIAGNOSIS — I890 Lymphedema, not elsewhere classified: Secondary | ICD-10-CM

## 2023-05-01 MED ORDER — OXYCODONE HCL 5 MG PO TABS
5.0000 mg | ORAL_TABLET | Freq: Three times a day (TID) | ORAL | 0 refills | Status: DC | PRN
  Filled 2023-05-01: qty 21, 7d supply, fill #0

## 2023-05-01 NOTE — Progress Notes (Signed)
Post-Op Visit Note   Patient: Taylor Hughes           Date of Birth: 08-27-1970           MRN: [id number] Visit Date: 05/01/2023 PCP: Hoy Register, MD  Chief Complaint:  Chief Complaint  Patient presents with   Right Foot - Routine Post Op    03/25/2023 right TMA     HPI:  HPI The patient is a 52 year old gentleman seen status post right transmetatarsal amputation.   Significant edema bilateral lower extremities pendulous ankles history of lymphedema  His postop shoe for the right foot is currently worn out and broken down. will not stay on  Ortho Exam On examination of the right foot the incision is approximated with sutures and is healing well centrally there is 1 area that gaped about 3 mm this is filled in with granulation a length of 2 cm there is no erythema no drainage no sign of infection   Examination bilateral lower extremities the circumference of his right ankle 38 cm right calf 66 cm right knee 55 cm left ankle 29 cm left calf 56 cm left knee 51 cm  Visit Diagnoses: No diagnosis found.  Plan: He will continue daily Dial soap cleansing.  Dry dressings.  Suture removable today.  Will plan to get him set up with lymphedema pumps.  Follow-Up Instructions: No follow-ups on file.   Imaging: No results found.  Orders:  No orders of the defined types were placed in this encounter.  Meds ordered this encounter  Medications   oxyCODONE (OXY IR/ROXICODONE) 5 MG immediate release tablet    Sig: Take 1 tablet (5 mg total) by mouth every 8 (eight) hours as needed for moderate pain or severe pain.    Dispense:  21 tablet    Refill:  0     PMFS History: Patient Active Problem List   Diagnosis Date Noted   History of transmetatarsal amputation of right foot (HCC) 04/27/2023   Severe protein-calorie malnutrition (HCC) 03/24/2023   Lymphedema 03/24/2023   Abscess of right foot 03/24/2023   Sinus tachycardia 03/24/2023   Right ventricular enlargement 03/24/2023    Diabetic foot infection (HCC) 03/23/2023   Osteomyelitis (HCC) 03/22/2023   Atrial fibrillation, chronic (HCC) 03/22/2023   HLD (hyperlipidemia) 03/22/2023   Gastroparesis 03/22/2023   Depression 09/08/2022   DKA (diabetic ketoacidosis) (HCC) 08/13/2022   Influenza A 08/13/2022   GERD (gastroesophageal reflux disease) 03/03/2022   Diabetes mellitus (HCC) 09/19/2021   Abscess of neck 09/19/2021   URI (upper respiratory infection) 06/07/2021   PAF (paroxysmal atrial fibrillation) (HCC)    Hypokalemia    Normocytic anemia    Sepsis (HCC) 05/24/2019   Cellulitis of chest wall    Venous insufficiency (chronic) (peripheral) 03/15/2019   Edema of both lower extremities due to peripheral venous insufficiency 03/15/2019   Hypoglycemia due to insulin 12/18/2018   Neck abscess 11/23/2018   Essential hypertension 11/23/2018   AKI (acute kidney injury) (HCC) 11/23/2018   Obesity (BMI 30.0-34.9) 11/23/2018   Type 2 diabetes mellitus with other specified complication (HCC) 11/23/2018   Sepsis due to group B Streptococcus (HCC)    Diabetic infection of left foot (HCC)    Left foot infection    DMII (diabetes mellitus, type 2) (HCC) 07/28/2018   Leukocytosis 07/28/2018   Hyponatremia 07/28/2018   Past Medical History:  Diagnosis Date   Atrial fibrillation (HCC)    Diabetes mellitus without complication (HCC)    Type 2  GERD (gastroesophageal reflux disease)    Wears glasses    Wound, open, foot    left diabetic     Family History  Problem Relation Age of Onset   Diabetes Mother     Past Surgical History:  Procedure Laterality Date   AMPUTATION Left 08/20/2018   Procedure: LEFT FOOT IRRIGATON AND DEBRIDEMENT, 5TH RAY AMPUTATION;  Surgeon: Nadara Mustard, MD;  Location: [facility] OR;  Service: Orthopedics;  Laterality: Left;   AMPUTATION Right 03/25/2023   Procedure: RIGHT TRANSMETATARSAL AMPUTATION;  Surgeon: Nadara Mustard, MD;  Location: [facility] OR;  Service: Orthopedics;  Laterality: Right;    APPLICATION OF A-CELL OF CHEST/ABDOMEN N/A 05/30/2019   Procedure: Application Of A-Cell Of Chest;  Surgeon: Corliss Skains, MD;  Location: [facility] OR;  Service: Cardiothoracic;  Laterality: N/A;   APPLICATION OF WOUND VAC N/A 05/30/2019   Procedure: Application Of Wound Vac;  Surgeon: Corliss Skains, MD;  Location: [facility] OR;  Service: Cardiothoracic;  Laterality: N/A;   FINGER SURGERY Right 2016   I&D  small finger   I & D EXTREMITY Left 07/30/2018   Procedure: IRRIGATION AND DEBRIDEMENT LEFT FOOT WITH POSSIBLE AMPUTATION OF FIFTH TOE;  Surgeon: Kathryne Hitch, MD;  Location: [facility]S;  Service: Orthopedics;  Laterality: Left;   INCISION AND DRAINAGE ABSCESS Left 01/19/2019   Procedure: INCISION AND DRAINAGE ABSCESS upper chest;  Surgeon: Christia Reading, MD;  Location: [facility] OR;  Service: ENT;  Laterality: Left;   IRRIGATION AND DEBRIDEMENT STERNOCLAVICULAR JOINT-STERNUM AND RIBS N/A 05/30/2019   Procedure: IRRIGATION AND DEBRIDEMENT OF STERNOCLAVICULAR JOINT-STERNUM AND RIBS ;  Surgeon: Corliss Skains, MD;  Location: [facility] OR;  Service: Cardiothoracic;  Laterality: N/A;   MINOR IRRIGATION AND DEBRIDEMENT OF WOUND N/A 11/22/2018   Procedure: INCISION AND DRAINAGE OF NECK ABSCESS;  Surgeon: Christia Reading, MD;  Location: [facility]S;  Service: ENT;  Laterality: N/A;   Social History   Occupational History   Not on file  Tobacco Use   Smoking status: Never   Smokeless tobacco: Never  Vaping Use   Vaping status: Never Used  Substance and Sexual Activity   Alcohol use: No    Alcohol/week: 0.0 standard drinks of alcohol   Drug use: No   Sexual activity: Not on file

## 2023-05-01 NOTE — Progress Notes (Signed)
Taylor Hughes Mar 20, 1971 [id number]  Patient outreached by Sofie Rower , PharmD on 05/01/2023.  Blood Pressure Readings: Does the patient have a validated home blood pressure machine?: No He was encouraged to reach out to his insurer's customer service number for resources to access blood pressure cuffs at a low/reduced cost. He was also informed [employer] brand BP monitors are ~$30. He stated due to recent admission (surgery) he has not been working and that it may not be affordable at the moment. His sister has a blood pressure monitor and he will try to use hers for the time being.   Medication review was performed.    Differences from their prescribed list include: He was not aware that his PCP wanted him to continue metoprolol 25 mg bid. He was encouraged to continue Metoprolol along with Valsartan per her last note.   The following barriers to adherence were noted: Does the patient have cost concerns?: No Does the patient have transportation concerns?: No Does the patient need assistance obtaining refills?: No Does the patient occassionally forget to take some of their prescribed medications?: No Does the patient feel like one/some of their medications make them feel poorly?: No Does the patient have questions or concerns about their medications?: No Does the patient have a follow up scheduled with their primary care provider/cardiologist?: Yes   Interventions: Interventions Completed: Medications were reviewed, Patient was educated on medications, including indication and administration, Patient was educated on how to access home blood pressure machine, Patient was educated on proper technique to check home blood pressure and reminded to bring home machine and readings to next provider appointment  The patient has follow up scheduled:  PCP: Hoy Register, MD   Gwenlyn Found, RPH

## 2023-05-07 ENCOUNTER — Ambulatory Visit: Payer: PRIVATE HEALTH INSURANCE | Attending: Family Medicine | Admitting: Family Medicine

## 2023-05-07 ENCOUNTER — Encounter: Payer: Self-pay | Admitting: Family Medicine

## 2023-05-07 ENCOUNTER — Encounter: Payer: Self-pay | Admitting: Physical Therapy

## 2023-05-07 ENCOUNTER — Other Ambulatory Visit: Payer: Self-pay

## 2023-05-07 ENCOUNTER — Ambulatory Visit: Payer: PRIVATE HEALTH INSURANCE | Admitting: Physical Therapy

## 2023-05-07 VITALS — BP 169/101 | HR 82 | Ht 71.0 in | Wt 312.4 lb

## 2023-05-07 DIAGNOSIS — I152 Hypertension secondary to endocrine disorders: Secondary | ICD-10-CM | POA: Diagnosis not present

## 2023-05-07 DIAGNOSIS — E1159 Type 2 diabetes mellitus with other circulatory complications: Secondary | ICD-10-CM | POA: Diagnosis not present

## 2023-05-07 DIAGNOSIS — I890 Lymphedema, not elsewhere classified: Secondary | ICD-10-CM

## 2023-05-07 DIAGNOSIS — Z7984 Long term (current) use of oral hypoglycemic drugs: Secondary | ICD-10-CM | POA: Diagnosis not present

## 2023-05-07 DIAGNOSIS — F439 Reaction to severe stress, unspecified: Secondary | ICD-10-CM

## 2023-05-07 DIAGNOSIS — L299 Pruritus, unspecified: Secondary | ICD-10-CM

## 2023-05-07 MED ORDER — HYDROCORTISONE 0.5 % EX CREA
1.0000 | TOPICAL_CREAM | Freq: Two times a day (BID) | CUTANEOUS | 1 refills | Status: DC
Start: 2023-05-07 — End: 2023-11-10
  Filled 2023-05-07 – 2023-06-30 (×4): qty 30, 15d supply, fill #0
  Filled 2023-07-13 – 2023-07-22 (×2): qty 28.4, 14d supply, fill #0

## 2023-05-07 MED ORDER — VALSARTAN 160 MG PO TABS: 160.0000 mg | ORAL_TABLET | Freq: Every day | ORAL | Status: DC

## 2023-05-07 NOTE — Therapy (Signed)
[facility] [address] [place], [place], [address] Phone: [phone]   Fax:  [phone]  Patient Details  Name: Taylor Hughes MRN: [id number] Date of Birth: 1971/01/24 Referring Provider:  No ref. provider found  Encounter Date: 05/07/2023  Called and spoke to pt regarding no-show to today's scheduled PT appointment. Pt reports he would like to be DC at this time so remainder of appointments cancelled.   PHYSICAL THERAPY DISCHARGE SUMMARY  Visits from Start of Care: 1  Current functional level related to goals / functional outcomes: N/A   Remaining deficits: N/A   Education / Equipment: N/A   Patient agrees to discharge. Patient goals were not met. Patient is being discharged due to not returning since eval.   Jill Alexanders Temekia Caskey, PT, DPT 05/07/2023, 4:20 PM  [facility] [address] [place], [place], [address] Phone: [phone]   Fax:  [phone]

## 2023-05-07 NOTE — Progress Notes (Signed)
Subjective:  Patient ID: Taylor Hughes, male    DOB: [birth year]  Age: 52 y.o. MRN: [id number]  CC: Blood Pressure Check (Lower back itching)   HPI Taylor Hughes is a 52 y.o. year old male with a history of Type 2 diabetes mellitus (A1c of 9.8), L fifth toe ray amputation, hypertension, hyperlipidemia, Lymphedema, right foot transmetatarsal amputation (in 03/2023)   Interval History: Discussed the use of AI scribe software for clinical note transcription with the patient, who gave verbal consent to proceed.  He presents for a follow-up visit to check his blood pressure. He reports that he has been taking his prescribed medication, Diovan 80 mg, but there was confusion about whether he should continue taking metoprolol. He had stopped taking metoprolol under the impression that it was being replaced by Diovan, but after a phone call from the clinic, he resumed taking metoprolol a few days ago. He confirms that he took both medications this morning.  [name] also reports significant stress due to financial difficulties and unemployment. He is unable to work for extended periods due to his lymphedema. He believes this stress is contributing to his high blood pressure. He is currently seeking disability benefits.  In addition to his hypertension, [name] is also dealing with an itching sensation on his lower back that started a few days ago. He has been taking furosemide for swelling in his legs.       Past Medical History:  Diagnosis Date   Atrial fibrillation (HCC)    Diabetes mellitus without complication (HCC)    Type 2   GERD (gastroesophageal reflux disease)    Wears glasses    Wound, open, foot    left diabetic     Past Surgical History:  Procedure Laterality Date   AMPUTATION Left 08/20/2018   Procedure: LEFT FOOT IRRIGATON AND DEBRIDEMENT, 5TH RAY AMPUTATION;  Surgeon: Nadara Mustard, MD;  Location: [facility] OR;  Service: Orthopedics;  Laterality: Left;   AMPUTATION Right 03/25/2023    Procedure: RIGHT TRANSMETATARSAL AMPUTATION;  Surgeon: Nadara Mustard, MD;  Location: [facility] OR;  Service: Orthopedics;  Laterality: Right;   APPLICATION OF A-CELL OF CHEST/ABDOMEN N/A 05/30/2019   Procedure: Application Of A-Cell Of Chest;  Surgeon: Corliss Skains, MD;  Location: [facility] OR;  Service: Cardiothoracic;  Laterality: N/A;   APPLICATION OF WOUND VAC N/A 05/30/2019   Procedure: Application Of Wound Vac;  Surgeon: Corliss Skains, MD;  Location: [facility] OR;  Service: Cardiothoracic;  Laterality: N/A;   FINGER SURGERY Right 2016   I&D  small finger   I & D EXTREMITY Left 07/30/2018   Procedure: IRRIGATION AND DEBRIDEMENT LEFT FOOT WITH POSSIBLE AMPUTATION OF FIFTH TOE;  Surgeon: Kathryne Hitch, MD;  Location: [facility] ORS;  Service: Orthopedics;  Laterality: Left;   INCISION AND DRAINAGE ABSCESS Left 01/19/2019   Procedure: INCISION AND DRAINAGE ABSCESS upper chest;  Surgeon: Christia Reading, MD;  Location: [facility] OR;  Service: ENT;  Laterality: Left;   IRRIGATION AND DEBRIDEMENT STERNOCLAVICULAR JOINT-STERNUM AND RIBS N/A 05/30/2019   Procedure: IRRIGATION AND DEBRIDEMENT OF STERNOCLAVICULAR JOINT-STERNUM AND RIBS ;  Surgeon: Corliss Skains, MD;  Location: [facility] OR;  Service: Cardiothoracic;  Laterality: N/A;   MINOR IRRIGATION AND DEBRIDEMENT OF WOUND N/A 11/22/2018   Procedure: INCISION AND DRAINAGE OF NECK ABSCESS;  Surgeon: Christia Reading, MD;  Location: [facility] ORS;  Service: ENT;  Laterality: N/A;    Family History  Problem Relation Age of Onset   Diabetes Mother  Social History   Socioeconomic History   Marital status: Divorced    Spouse name: Not on file   Number of children: Not on file   Years of education: Not on file   Highest education level: Not on file  Occupational History   Not on file  Tobacco Use   Smoking status: Never   Smokeless tobacco: Never  Vaping Use   Vaping status: Never Used  Substance and Sexual Activity   Alcohol use: No    Alcohol/week:  0.0 standard drinks of alcohol   Drug use: No   Sexual activity: Not on file  Other Topics Concern   Not on file  Social History Narrative   Not on file   Social Determinants of Health   Financial Resource Strain: Not on file  Food Insecurity: No Food Insecurity (03/23/2023)   Hunger Vital Sign    Worried About Running Out of Food in the Last Year: Never true    Ran Out of Food in the Last Year: Never true  Transportation Needs: No Transportation Needs (03/23/2023)   PRAPARE - Administrator, Civil Service (Medical): No    Lack of Transportation (Non-Medical): No  Physical Activity: Not on file  Stress: Not on file  Social Connections: Not on file    No Known Allergies  Outpatient Medications Prior to Visit  Medication Sig Dispense Refill   albuterol (VENTOLIN HFA) 108 (90 Base) MCG/ACT inhaler Inhale 2 puffs into the lungs every 6 (six) hours as needed for wheezing or shortness of breath. 6.7 g 0   aspirin EC 81 MG tablet Take 1 tablet (81 mg total) by mouth daily. Swallow whole.     atorvastatin (LIPITOR) 20 MG tablet Take 1 tablet (20 mg total) by mouth daily. 90 tablet 1   Blood Glucose Monitoring Suppl (TRUE METRIX METER) w/Device KIT Use to check blood sugar three times daily. 1 kit 0   buPROPion (WELLBUTRIN XL) 150 MG 24 hr tablet Take 1 tablet (150 mg total) by mouth daily. 30 tablet 1   folic acid (FOLVITE) 1 MG tablet Take 1 tablet (1 mg total) by mouth daily. 30 tablet 1   furosemide (LASIX) 20 MG tablet Take 3 tablets (60 mg total) by mouth daily for 5 days, THEN continue 2 tablets (40 mg total) daily thereafter 90 tablet 0   gabapentin (NEURONTIN) 300 MG capsule Take 1 capsule (300 mg total) by mouth 2 (two) times daily. (Patient taking differently: Take 300 mg by mouth 3 (three) times daily.) 180 capsule 1   glucose blood (TRUE METRIX BLOOD GLUCOSE TEST) test strip Use as directed 100 strip 2   Insulin Glargine (BASAGLAR KWIKPEN) 100 UNIT/ML Inject 10 Units  into the skin daily.     insulin lispro (HUMALOG KWIKPEN) 100 UNIT/ML KwikPen Inject 0-12 units into the skin three times daily with meals. Per sliding scale. 12 mL 3   Insulin Pen Needle 32G X 6 MM MISC USE AS DIRECTED 100 each 0   Lactobacillus (ACIDOPHILUS) CAPS capsule Take 2 capsules by mouth daily. 60 capsule 1   metoprolol tartrate (LOPRESSOR) 25 MG tablet Take 1 tablet (25 mg total) by mouth 2 (two) times daily. 60 tablet 1   omeprazole (PRILOSEC) 40 MG capsule Take 1 capsule (40 mg total) by mouth daily. 90 capsule 1   oxyCODONE (OXY IR/ROXICODONE) 5 MG immediate release tablet Take 1 tablet (5 mg total) by mouth every 8 (eight) hours as needed for moderate pain or  severe pain. 21 tablet 0   sildenafil (VIAGRA) 50 MG tablet Take 1 tablet (50 mg total) by mouth daily as needed for erectile dysfunction. At least 24 hours between doses 10 tablet 3   TRUEplus Lancets 28G MISC Use to check blood sugar three times daily. 100 each 11   valsartan (DIOVAN) 80 MG tablet Take 1 tablet (80 mg total) by mouth daily. 90 tablet 1   No facility-administered medications prior to visit.     ROS Review of Systems  Constitutional:  Negative for activity change and appetite change.  HENT:  Negative for sinus pressure and sore throat.   Respiratory:  Negative for chest tightness, shortness of breath and wheezing.   Cardiovascular:  Positive for leg swelling. Negative for chest pain and palpitations.  Gastrointestinal:  Negative for abdominal distention, abdominal pain and constipation.  Genitourinary: Negative.   Musculoskeletal: Negative.   Psychiatric/Behavioral:  Negative for behavioral problems and dysphoric mood.     Objective:  BP (!) 169/101   Pulse 82   Ht 5\' 11"  (1.803 m)   Wt (!) 312 lb 6.4 oz (141.7 kg)   SpO2 99%   BMI 43.57 kg/m      05/07/2023    9:12 AM 05/07/2023    8:47 AM 04/27/2023   10:55 AM  BP/Weight  Systolic BP 169 173 157  Diastolic BP 101 104 88  Wt. (Lbs)  312.4    BMI  43.57 kg/m2       Physical Exam Constitutional:      Appearance: He is well-developed.  Cardiovascular:     Rate and Rhythm: Normal rate.     Heart sounds: Normal heart sounds. No murmur heard. Pulmonary:     Effort: Pulmonary effort is normal.     Breath sounds: Normal breath sounds. No wheezing or rales.  Chest:     Chest wall: No tenderness.  Abdominal:     General: Bowel sounds are normal. There is no distension.     Palpations: Abdomen is soft. There is no mass.     Tenderness: There is no abdominal tenderness.  Musculoskeletal:        General: Normal range of motion.     Right lower leg: Edema present.     Left lower leg: Edema present.  Skin:    Comments: Striae in lower back at site of complaint  Neurological:     Mental Status: He is alert and oriented to person, place, and time.  Psychiatric:        Mood and Affect: Mood normal.        Latest Ref Rng & Units 04/07/2023    3:23 AM 04/06/2023    9:31 PM 04/06/2023    3:38 AM  CMP  Glucose 70 - 99 mg/dL 782  956  52   BUN 6 - 20 mg/dL 15  14  13    Creatinine 0.61 - 1.24 mg/dL 2.13  0.86  5.78   Sodium 135 - 145 mmol/L 136  137  137   Potassium 3.5 - 5.1 mmol/L 3.9  4.0  3.6   Chloride 98 - 111 mmol/L 105  100  105   CO2 22 - 32 mmol/L 24  24  23    Calcium 8.9 - 10.3 mg/dL 8.7  8.8  8.6     Lipid Panel     Component Value Date/Time   CHOL 173 05/09/2022 1359   TRIG 50 05/09/2022 1359   HDL 71 05/09/2022 1359   LDLCALC  92 05/09/2022 1359    CBC    Component Value Date/Time   WBC 4.8 04/07/2023 0323   RBC 3.07 (L) 04/07/2023 0323   HGB 8.2 (L) 04/07/2023 0323   HCT 26.6 (L) 04/07/2023 0323   PLT 453 (H) 04/07/2023 0323   MCV 86.6 04/07/2023 0323   MCH 26.7 04/07/2023 0323   MCHC 30.8 04/07/2023 0323   RDW 18.2 (H) 04/07/2023 0323   LYMPHSABS 1.6 04/01/2023 0338   MONOABS 0.7 04/01/2023 0338   EOSABS 0.2 04/01/2023 0338   BASOSABS 0.0 04/01/2023 0338    Lab Results  Component Value  Date   HGBA1C 9.8 (H) 03/23/2023    Assessment & Plan:      Hypertension Elevated blood pressure despite Valsartan 80mg  daily. Misunderstanding about concurrent use of Metoprolol, which was resumed a few days ago. -Increase to Valsartan 160mg  daily and continue Metoprolol BID. -Counseled on blood pressure goal of less than 130/80, low-sodium, DASH diet, medication compliance, 150 minutes of moderate intensity exercise per week. Discussed medication compliance, adverse effects.  Lymphedema Complaints of swelling in feet, currently on Furosemide 40mg . -Continue Furosemide 40mg  daily.  Pruritus Complaints of itching in the lower back region, no visible rash or skin changes. -Area corresponds with striae -Apply over-the-counter Hydrocortisone cream to affected area.  Stress Patient reports significant stress due to financial and employment issues, which may be contributing to elevated blood pressure. -Encourage stress management techniques and provide support as needed.  Follow-up Monitor blood pressure and adjust medication as necessary.          Meds ordered this encounter  Medications   hydrocortisone cream 0.5 %    Sig: Apply 1 Application topically 2 (two) times daily.    Dispense:  30 g    Refill:  1   valsartan (DIOVAN) 160 MG tablet    Sig: Take 1 tablet (160 mg total) by mouth daily.    Discontinue valsartan/HCTZ    Follow-up: Return in about 6 weeks (around 06/18/2023) for Blood Pressure follow-up with PCP.       Hoy Register, MD, FAAFP. [facility] [place], [place] [phone]   05/07/2023, 12:00 PM

## 2023-05-07 NOTE — Patient Instructions (Signed)
Managing Your Hypertension Hypertension, also called high blood pressure, is when the force of the blood pressing against the walls of the arteries is too strong. Arteries are blood vessels that carry blood from your heart throughout your body. Hypertension forces the heart to work harder to pump blood and may cause the arteries to become narrow or stiff. Understanding blood pressure readings A blood pressure reading includes a higher number over a lower number: The first, or top, number is called the systolic pressure. It is a measure of the pressure in your arteries as your heart beats. The second, or bottom number, is called the diastolic pressure. It is a measure of the pressure in your arteries as the heart relaxes. For most people, a normal blood pressure is below 120/80. Your personal target blood pressure may vary depending on your medical conditions, your age, and other factors. Blood pressure is classified into four stages. Based on your blood pressure reading, your health care provider may use the following stages to determine what type of treatment you need, if any. Systolic pressure and diastolic pressure are measured in a unit called millimeters of mercury (mmHg). Normal Systolic pressure: below 120. Diastolic pressure: below 80. Elevated Systolic pressure: 120-129. Diastolic pressure: below 80. Hypertension stage 1 Systolic pressure: 130-139. Diastolic pressure: 80-89. Hypertension stage 2 Systolic pressure: 140 or above. Diastolic pressure: 90 or above. How can this condition affect me? Managing your hypertension is very important. Over time, hypertension can damage the arteries and decrease blood flow to parts of the body, including the brain, heart, and kidneys. Having untreated or uncontrolled hypertension can lead to: A heart attack. A stroke. A weakened blood vessel (aneurysm). Heart failure. Kidney damage. Eye damage. Memory and concentration problems. Vascular  dementia. What actions can I take to manage this condition? Hypertension can be managed by making lifestyle changes and possibly by taking medicines. Your health care provider will help you make a plan to bring your blood pressure within a normal range. You may be referred for counseling on a healthy diet and physical activity. Nutrition  Eat a diet that is high in fiber and potassium, and low in salt (sodium), added sugar, and fat. An example eating plan is called the DASH diet. DASH stands for Dietary Approaches to Stop Hypertension. To eat this way: Eat plenty of fresh fruits and vegetables. Try to fill one-half of your plate at each meal with fruits and vegetables. Eat whole grains, such as whole-wheat pasta, brown rice, or whole-grain bread. Fill about one-fourth of your plate with whole grains. Eat low-fat dairy products. Avoid fatty cuts of meat, processed or cured meats, and poultry with skin. Fill about one-fourth of your plate with lean proteins such as fish, chicken without skin, beans, eggs, and tofu. Avoid pre-made and processed foods. These tend to be higher in sodium, added sugar, and fat. Reduce your daily sodium intake. Many people with hypertension should eat less than 1,500 mg of sodium a day. Lifestyle  Work with your health care provider to maintain a healthy body weight or to lose weight. Ask what an ideal weight is for you. Get at least 30 minutes of exercise that causes your heart to beat faster (aerobic exercise) most days of the week. Activities may include walking, swimming, or biking. Include exercise to strengthen your muscles (resistance exercise), such as weight lifting, as part of your weekly exercise routine. Try to do these types of exercises for 30 minutes at least 3 days a week. Do   not use any products that contain nicotine or tobacco. These products include cigarettes, chewing tobacco, and vaping devices, such as e-cigarettes. If you need help quitting, ask your  health care provider. Control any long-term (chronic) conditions you have, such as high cholesterol or diabetes. Identify your sources of stress and find ways to manage stress. This may include meditation, deep breathing, or making time for fun activities. Alcohol use Do not drink alcohol if: Your health care provider tells you not to drink. You are pregnant, may be pregnant, or are planning to become pregnant. If you drink alcohol: Limit how much you have to: 0-1 drink a day for women. 0-2 drinks a day for men. Know how much alcohol is in your drink. In the [place] one drink equals one 12 oz bottle of beer (355 mL), one 5 oz glass of wine (148 mL), or one 1 oz glass of hard liquor (44 mL). Medicines Your health care provider may prescribe medicine if lifestyle changes are not enough to get your blood pressure under control and if: Your systolic blood pressure is 130 or higher. Your diastolic blood pressure is 80 or higher. Take medicines only as told by your health care provider. Follow the directions carefully. Blood pressure medicines must be taken as told by your health care provider. The medicine does not work as well when you skip doses. Skipping doses also puts you at risk for problems. Monitoring Before you monitor your blood pressure: Do not smoke, drink caffeinated beverages, or exercise within 30 minutes before taking a measurement. Use the bathroom and empty your bladder (urinate). Sit quietly for at least 5 minutes before taking measurements. Monitor your blood pressure at home as told by your health care provider. To do this: Sit with your back straight and supported. Place your feet flat on the floor. Do not cross your legs. Support your arm on a flat surface, such as a table. Make sure your upper arm is at heart level. Each time you measure, take two or three readings one minute apart and record the results. You may also need to have your blood pressure checked regularly by  your health care provider. General information Talk with your health care provider about your diet, exercise habits, and other lifestyle factors that may be contributing to hypertension. Review all the medicines you take with your health care provider because there may be side effects or interactions. Keep all follow-up visits. Your health care provider can help you create and adjust your plan for managing your high blood pressure. Where to find more information National Heart, Lung, and Blood Institute: www.nhlbi.nih.gov American Heart Association: www.heart.org Contact a health care provider if: You think you are having a reaction to medicines you have taken. You have repeated (recurrent) headaches. You feel dizzy. You have swelling in your ankles. You have trouble with your vision. Get help right away if: You develop a severe headache or confusion. You have unusual weakness or numbness, or you feel faint. You have severe pain in your chest or abdomen. You vomit repeatedly. You have trouble breathing. These symptoms may be an emergency. Get help right away. Call 911. Do not wait to see if the symptoms will go away. Do not drive yourself to the hospital. Summary Hypertension is when the force of blood pumping through your arteries is too strong. If this condition is not controlled, it may put you at risk for serious complications. Your personal target blood pressure may vary depending on your medical conditions,   your age, and other factors. For most people, a normal blood pressure is less than 120/80. Hypertension is managed by lifestyle changes, medicines, or both. Lifestyle changes to help manage hypertension include losing weight, eating a healthy, low-sodium diet, exercising more, stopping smoking, and limiting alcohol. This information is not intended to replace advice given to you by your health care provider. Make sure you discuss any questions you have with your health care  provider. Document Revised: 04/11/2021 Document Reviewed: 04/11/2021 Elsevier Patient Education  2024 Elsevier Inc.  

## 2023-05-12 DIAGNOSIS — Z419 Encounter for procedure for purposes other than remedying health state, unspecified: Secondary | ICD-10-CM | POA: Diagnosis not present

## 2023-05-14 ENCOUNTER — Ambulatory Visit: Payer: PRIVATE HEALTH INSURANCE | Admitting: Physical Therapy

## 2023-05-14 ENCOUNTER — Other Ambulatory Visit: Payer: Self-pay | Admitting: Family Medicine

## 2023-05-14 ENCOUNTER — Other Ambulatory Visit: Payer: Self-pay

## 2023-05-14 DIAGNOSIS — I890 Lymphedema, not elsewhere classified: Secondary | ICD-10-CM

## 2023-05-14 DIAGNOSIS — Z794 Long term (current) use of insulin: Secondary | ICD-10-CM

## 2023-05-14 DIAGNOSIS — E1142 Type 2 diabetes mellitus with diabetic polyneuropathy: Secondary | ICD-10-CM

## 2023-05-14 MED ORDER — METOPROLOL TARTRATE 25 MG PO TABS
25.0000 mg | ORAL_TABLET | Freq: Two times a day (BID) | ORAL | 1 refills | Status: DC
  Filled 2023-05-14: qty 180, 90d supply, fill #0
  Filled 2023-08-17: qty 180, 90d supply, fill #1

## 2023-05-14 MED ORDER — FUROSEMIDE 20 MG PO TABS
40.0000 mg | ORAL_TABLET | Freq: Every day | ORAL | 1 refills | Status: DC
  Filled 2023-05-14: qty 180, 90d supply, fill #0

## 2023-05-14 MED ORDER — BASAGLAR KWIKPEN 100 UNIT/ML ~~LOC~~ SOPN
10.0000 [IU] | PEN_INJECTOR | Freq: Every day | SUBCUTANEOUS | 1 refills | Status: DC
  Filled 2023-05-14: qty 9, 84d supply, fill #0

## 2023-05-15 ENCOUNTER — Other Ambulatory Visit: Payer: Self-pay

## 2023-05-15 ENCOUNTER — Ambulatory Visit (INDEPENDENT_AMBULATORY_CARE_PROVIDER_SITE_OTHER): Payer: PRIVATE HEALTH INSURANCE | Admitting: Family

## 2023-05-15 ENCOUNTER — Telehealth: Payer: Self-pay | Admitting: Family

## 2023-05-15 DIAGNOSIS — Z89431 Acquired absence of right foot: Secondary | ICD-10-CM

## 2023-05-15 NOTE — Telephone Encounter (Signed)
Pt called advised he needs a refill on oxycodone please. [name]

## 2023-05-18 ENCOUNTER — Other Ambulatory Visit: Payer: Self-pay | Admitting: Family Medicine

## 2023-05-18 ENCOUNTER — Telehealth: Payer: Self-pay | Admitting: Family

## 2023-05-18 ENCOUNTER — Other Ambulatory Visit: Payer: Self-pay

## 2023-05-18 ENCOUNTER — Other Ambulatory Visit: Payer: Self-pay | Admitting: Orthopedic Surgery

## 2023-05-18 MED ORDER — TRUE METRIX BLOOD GLUCOSE TEST VI STRP
1.0000 | ORAL_STRIP | 2 refills | Status: DC
  Filled 2023-05-18: qty 100, 33d supply, fill #0
  Filled 2023-06-02: qty 100, 33d supply, fill #1

## 2023-05-18 MED ORDER — OXYCODONE HCL 5 MG PO TABS
5.0000 mg | ORAL_TABLET | Freq: Three times a day (TID) | ORAL | 0 refills | Status: DC | PRN
  Filled 2023-05-18: qty 21, 7d supply, fill #0

## 2023-05-18 NOTE — Telephone Encounter (Signed)
Pt is s/p a right TMA 03/25/2023 and requesting refill of Oxycodone. Last refill was 05/01/2023 #21 please advise.

## 2023-05-18 NOTE — Telephone Encounter (Signed)
Pt would like to know if [name] will fill Oxycodone refill please advise pt called in on 10/4

## 2023-05-18 NOTE — Telephone Encounter (Signed)
I called and advised pt that rx has been sent in to pharm.

## 2023-05-19 ENCOUNTER — Encounter: Payer: Self-pay | Admitting: Family

## 2023-05-19 NOTE — Progress Notes (Signed)
Post-Op Visit Note   Patient: Taylor Hughes           Date of Birth: Feb 26, 1971           MRN: [id number] Visit Date: 05/15/2023 PCP: Hoy Register, MD  Chief Complaint:  Chief Complaint  Patient presents with   Right Foot - Routine Post Op    03/25/2023 right TMA    HPI:  HPI The patient is a 52 year old gentleman seen status post right transmetatarsal amputation.   Significant edema bilateral lower extremities pendulous ankles history of lymphedema Is in regular shoe wear today.  Ortho Exam On examination of the right foot the incision is healing well there is 1 remaining open area this is 2 cm in diameter there are 2 mm of depth filled in with the 100% granulation there is no drainage no surrounding erythema or warmth  Significant bilateral edema and lymphedema there are pendulous skin folds over the ankles papillomas are present there is no weeping  Visit Diagnoses: No diagnosis found.  Plan: He will continue daily Dial soap cleansing.  Dry dressings. Still plan to get him set up with lymphedema pumps.  Follow-Up Instructions: No follow-ups on file.   Imaging: No results found.  Orders:  No orders of the defined types were placed in this encounter.  No orders of the defined types were placed in this encounter.    PMFS History: Patient Active Problem List   Diagnosis Date Noted   History of transmetatarsal amputation of right foot (HCC) 04/27/2023   Severe protein-calorie malnutrition (HCC) 03/24/2023   Lymphedema 03/24/2023   Abscess of right foot 03/24/2023   Sinus tachycardia 03/24/2023   Right ventricular enlargement 03/24/2023   Diabetic foot infection (HCC) 03/23/2023   Osteomyelitis (HCC) 03/22/2023   Atrial fibrillation, chronic (HCC) 03/22/2023   HLD (hyperlipidemia) 03/22/2023   Gastroparesis 03/22/2023   Depression 09/08/2022   DKA (diabetic ketoacidosis) (HCC) 08/13/2022   Influenza A 08/13/2022   GERD (gastroesophageal reflux disease)  03/03/2022   Diabetes mellitus (HCC) 09/19/2021   Abscess of neck 09/19/2021   URI (upper respiratory infection) 06/07/2021   PAF (paroxysmal atrial fibrillation) (HCC)    Hypokalemia    Normocytic anemia    Sepsis (HCC) 05/24/2019   Cellulitis of chest wall    Venous insufficiency (chronic) (peripheral) 03/15/2019   Edema of both lower extremities due to peripheral venous insufficiency 03/15/2019   Hypoglycemia due to insulin 12/18/2018   Neck abscess 11/23/2018   Essential hypertension 11/23/2018   AKI (acute kidney injury) (HCC) 11/23/2018   Obesity (BMI 30.0-34.9) 11/23/2018   Type 2 diabetes mellitus with other specified complication (HCC) 11/23/2018   Sepsis due to group B Streptococcus (HCC)    Diabetic infection of left foot (HCC)    Left foot infection    DMII (diabetes mellitus, type 2) (HCC) 07/28/2018   Leukocytosis 07/28/2018   Hyponatremia 07/28/2018   Past Medical History:  Diagnosis Date   Atrial fibrillation (HCC)    Diabetes mellitus without complication (HCC)    Type 2   GERD (gastroesophageal reflux disease)    Wears glasses    Wound, open, foot    left diabetic     Family History  Problem Relation Age of Onset   Diabetes Mother     Past Surgical History:  Procedure Laterality Date   AMPUTATION Left 08/20/2018   Procedure: LEFT FOOT IRRIGATON AND DEBRIDEMENT, 5TH RAY AMPUTATION;  Surgeon: Nadara Mustard, MD;  [facility];  Service: Orthopedics;  Laterality: Left;   AMPUTATION Right 03/25/2023   Procedure: RIGHT TRANSMETATARSAL AMPUTATION;  Surgeon: Nadara Mustard, MD;  Location[facility];  Service: Orthopedics;  Laterality: Right;   APPLICATION OF A-CELL OF CHEST/ABDOMEN N/A 05/30/2019   Procedure: Application Of A-Cell Of Chest;  Surgeon: Corliss Skains, MD;  Location: [facility] OR;  Service: Cardiothoracic;  Laterality: N/A;   APPLICATION OF WOUND VAC N/A 05/30/2019   Procedure: Application Of Wound Vac;  Surgeon: Corliss Skains, MD;  Location:  [facility] OR;  Service: Cardiothoracic;  Laterality: N/A;   FINGER SURGERY Right 2016   I&D  small finger   I & D EXTREMITY Left 07/30/2018   Procedure: IRRIGATION AND DEBRIDEMENT LEFT FOOT WITH POSSIBLE AMPUTATION OF FIFTH TOE;  Surgeon: Kathryne Hitch, MD;  Location: [facility] ORS;  Service: Orthopedics;  Laterality: Left;   INCISION AND DRAINAGE ABSCESS Left 01/19/2019   Procedure: INCISION AND DRAINAGE ABSCESS upper chest;  Surgeon: Christia Reading, MD;  Location: [facility] OR;  Service: ENT;  Laterality: Left;   IRRIGATION AND DEBRIDEMENT STERNOCLAVICULAR JOINT-STERNUM AND RIBS N/A 05/30/2019   Procedure: IRRIGATION AND DEBRIDEMENT OF STERNOCLAVICULAR JOINT-STERNUM AND RIBS ;  Surgeon: Corliss Skains, MD;  [facility]: [facility] OR;  Service: Cardiothoracic;  Laterality: N/A;   MINOR IRRIGATION AND DEBRIDEMENT OF WOUND N/A 11/22/2018   Procedure: INCISION AND DRAINAGE OF NECK ABSCESS;  Surgeon: Christia Reading, MD;  [facility]: [facility]S;  Service: ENT;  Laterality: N/A;   Social History   Occupational History   Not on file  Tobacco Use   Smoking status: Never   Smokeless tobacco: Never  Vaping Use   Vaping status: Never Used  Substance and Sexual Activity   Alcohol use: No    Alcohol/week: 0.0 standard drinks of alcohol   Drug use: No   Sexual activity: Not on file

## 2023-05-25 ENCOUNTER — Telehealth: Payer: Self-pay | Admitting: Family

## 2023-05-25 NOTE — Telephone Encounter (Signed)
Patient called and wanted to know about the treatment for lipadema. He didn't hear anything from nobody. CB#[phone]

## 2023-05-25 NOTE — Telephone Encounter (Signed)
Can you please call this pt and move his appt from 05/29/2023 with Erin to 06/03/2023 with Erin? He needs lymphedema pumps and measurements of the legs have to be at least 30 days apart. Pt is aware of this please call to r/s thanks!

## 2023-05-29 ENCOUNTER — Ambulatory Visit: Payer: PRIVATE HEALTH INSURANCE | Admitting: Family

## 2023-06-02 ENCOUNTER — Other Ambulatory Visit: Payer: Self-pay

## 2023-06-02 ENCOUNTER — Other Ambulatory Visit: Payer: Self-pay | Admitting: Pharmacist

## 2023-06-02 ENCOUNTER — Ambulatory Visit: Payer: Self-pay

## 2023-06-02 ENCOUNTER — Other Ambulatory Visit: Payer: Self-pay | Admitting: Family Medicine

## 2023-06-02 DIAGNOSIS — Z89431 Acquired absence of right foot: Secondary | ICD-10-CM

## 2023-06-02 DIAGNOSIS — N528 Other male erectile dysfunction: Secondary | ICD-10-CM

## 2023-06-02 MED ORDER — ACCU-CHEK GUIDE W/DEVICE KIT
PACK | 0 refills | Status: DC
  Filled 2023-06-02: qty 1, 30d supply, fill #0

## 2023-06-02 MED ORDER — ACCU-CHEK GUIDE VI STRP
ORAL_STRIP | 6 refills | Status: DC
  Filled 2023-06-02: qty 100, fill #0
  Filled 2023-06-02: qty 100, 33d supply, fill #0

## 2023-06-02 MED ORDER — ACCU-CHEK SOFTCLIX LANCETS MISC
6 refills | Status: DC
  Filled 2023-06-02: qty 100, 33d supply, fill #0
  Filled 2023-07-13: qty 100, 33d supply, fill #1
  Filled 2023-11-04 – 2024-01-22 (×2): qty 100, 33d supply, fill #2

## 2023-06-02 MED ORDER — BASAGLAR KWIKPEN 100 UNIT/ML ~~LOC~~ SOPN
10.0000 [IU] | PEN_INJECTOR | Freq: Every day | SUBCUTANEOUS | 2 refills | Status: DC
  Filled 2023-06-02: qty 3, 30d supply, fill #0

## 2023-06-02 MED ORDER — LANTUS SOLOSTAR 100 UNIT/ML ~~LOC~~ SOPN
10.0000 [IU] | PEN_INJECTOR | Freq: Every day | SUBCUTANEOUS | 2 refills | Status: DC
  Filled 2023-06-02 – 2023-06-24 (×2): qty 3, 30d supply, fill #0
  Filled 2023-07-22: qty 3, 30d supply, fill #1
  Filled 2023-08-17: qty 3, 30d supply, fill #2

## 2023-06-02 NOTE — Addendum Note (Signed)
Addended by: Hoy Register on: 06/02/2023 06:33 PM   Modules accepted: Orders

## 2023-06-02 NOTE — Telephone Encounter (Addendum)
Spoke with patient . Patient voiced that he can not get anyone to help with his ongoing pain in his left foot and leg Patient is requesting referral to [facility]. Please advise

## 2023-06-02 NOTE — Telephone Encounter (Signed)
Referral has been placed. 

## 2023-06-02 NOTE — Telephone Encounter (Signed)
Chief Complaint: Leg/Foot pain  Symptoms: right foot and leg pain, 10/10, swelling (lymphedema) Frequency: constant, chronic Pertinent Negatives: Patient denies injury, fever, vomiting, nausea, signs of infection Disposition: [] ED /[] Urgent Care (no appt availability in office) / [] Appointment(In office/virtual)/ []  [facility]/ [] Home Care/ [] Refused Recommended Disposition /[] [facility]/ [x]  Follow-up with PCP Additional Notes: Patient states he continues to have right leg and foot pain that has been ongoing since his recent foot surgery. Patient states his pain is 10/10 on a daily basis. Patient pain is currently being managed by Ortho but patient is requesting a referral to a pain management clinic at this time. Care advice given and patient declined an appointment stating he just needs a referral to pain management. PCP is aware of the patient's pain, swelling and surgery. Advised patient I would forward referral request to PCP for recommendations at this time.  Reason for Disposition  Leg pain or muscle cramp is a chronic symptom (recurrent or ongoing AND present > 4 weeks)  Answer Assessment - Initial Assessment Questions 1. ONSET: "When did the pain start?"      Chronic  2. LOCATION: "Where is the pain located?"      Right leg and foot  3. PAIN: "How bad is the pain?"    (Scale 1-10; or mild, moderate, severe)   -  MILD (1-3): doesn't interfere with normal activities    -  MODERATE (4-7): interferes with normal activities (e.g., work or school) or awakens from sleep, limping    -  SEVERE (8-10): excruciating pain, unable to do any normal activities, unable to walk     10/10 4. WORK OR EXERCISE: "Has there been any recent work or exercise that involved this part of the body?"      I had surgery on the right foot and it has been painful  5. CAUSE: "What do you think is causing the leg pain?"     Post surgery pain  6. OTHER SYMPTOMS: "Do you have any other  symptoms?" (e.g., chest pain, back pain, breathing difficulty, swelling, rash, fever, numbness, weakness)     Swelling, numbness, weakness  Protocols used: Leg Pain-A-AH

## 2023-06-03 ENCOUNTER — Other Ambulatory Visit: Payer: Self-pay

## 2023-06-03 ENCOUNTER — Ambulatory Visit (INDEPENDENT_AMBULATORY_CARE_PROVIDER_SITE_OTHER): Payer: PRIVATE HEALTH INSURANCE | Admitting: Family

## 2023-06-03 ENCOUNTER — Encounter: Payer: Self-pay | Admitting: Family

## 2023-06-03 DIAGNOSIS — H5213 Myopia, bilateral: Secondary | ICD-10-CM | POA: Diagnosis not present

## 2023-06-03 DIAGNOSIS — Z89431 Acquired absence of right foot: Secondary | ICD-10-CM

## 2023-06-03 DIAGNOSIS — I890 Lymphedema, not elsewhere classified: Secondary | ICD-10-CM

## 2023-06-03 MED ORDER — SILDENAFIL CITRATE 50 MG PO TABS
50.0000 mg | ORAL_TABLET | Freq: Every day | ORAL | 1 refills | Status: DC | PRN
  Filled 2023-06-03: qty 10, 10d supply, fill #0
  Filled 2023-06-12: qty 10, 10d supply, fill #1

## 2023-06-03 MED ORDER — OXYCODONE HCL 5 MG PO TABS
5.0000 mg | ORAL_TABLET | Freq: Two times a day (BID) | ORAL | 0 refills | Status: DC | PRN
  Filled 2023-06-03: qty 14, 7d supply, fill #0

## 2023-06-03 NOTE — Telephone Encounter (Signed)
Requested Prescriptions  Pending Prescriptions Disp Refills   sildenafil (VIAGRA) 50 MG tablet 10 tablet 1    Sig: Take 1 tablet (50 mg total) by mouth daily as needed for erectile dysfunction. At least 24 hours between doses     Urology: Erectile Dysfunction Agents Failed - 06/02/2023 10:28 AM      Failed - AST in normal range and within 360 days    AST  Date Value Ref Range Status  03/24/2023 14 (L) 15 - 41 U/L Final         Failed - Last BP in normal range    BP Readings from Last 1 Encounters:  05/07/23 (!) 169/101         Passed - ALT in normal range and within 360 days    ALT  Date Value Ref Range Status  03/24/2023 14 0 - 44 U/L Final         Passed - Valid encounter within last 12 months    Recent Outpatient Visits           3 weeks ago Hypertension associated with diabetes [facility])   [place] [facility] [place], [place], MD   1 month ago Type 2 diabetes mellitus with diabetic polyneuropathy, with long-term current use of insulin (HCC)   [place] [facility] [place], [place], MD   4 months ago Type 2 diabetes mellitus with diabetic polyneuropathy, with long-term current use of insulin (HCC)   [place] [facility] Hoy Register, MD   7 months ago Essential hypertension   [facility] [place], [place] L, RPH-CPP   8 months ago Type 2 diabetes mellitus with diabetic polyneuropathy, with long-term current use of insulin [facility])   [place] [facility] Hoy Register, MD       Future Appointments             In 2 weeks Hoy Register, MD [facility]

## 2023-06-03 NOTE — Progress Notes (Addendum)
Office Visit Note   Patient: Taylor Hughes           Date of Birth: 1971/07/23           MRN: [id number] Visit Date: 06/03/2023              Requested by: Hoy Register, MD [address] [place],  [place] [address] PCP: Hoy Register, MD  Chief Complaint  Patient presents with   Right Foot - Routine Post Op    03/25/2023 right TMA       HPI: The patient is a 52 year old gentleman seen status post right transmetatarsal amputation.  He is pleased with healing of his incision.  He has failed 4 weeks of conservative treatment with failure of compression as well as elevation and exercise.  He has had 1 trial with failure of the basic pump.  Today he demoed the [id number] [phone]) basic pump for 10 minutes at 430 mmHg.  The patient complained of increased pain distally to the right lower extremity despite adjustments.  He had no complaints of pain with the Flexitouch which was demoed with 1 to 2 cm decrease in circumferential right lower extremity measurements throughout  Assessment & Plan: Visit Diagnoses: No diagnosis found.  Plan: Continue close monitoring of the foot.  Given an order for custom orthotic carbon fiber plate and spacer.  He is currently awaiting an appointment has already been referred to pain management by his primary care  Leah with Flexitouch to the bedside to demo a lymphedema pump today  Follow-Up Instructions: Return in about 4 weeks (around 07/01/2023).   Ortho Exam  Patient is alert, oriented, no adenopathy, well-dressed, normal affect, normal respiratory effort.  Significant edema bilateral lower extremities with lymphedema present papillomas present there is no weeping today On examination of the right foot the transmetatarsal amputation is well-healed there is buildup of hyperkeratotic nonviable tissue.  This is debrided after informed consent with a 10 blade knife back to viable tissue there is no erythema warmth no drainage  Imaging: No  results found. No images are attached to the encounter.  Labs: Lab Results  Component Value Date   HGBA1C 9.8 (H) 03/23/2023   HGBA1C 9.7 (A) 01/19/2023   HGBA1C 11.9 (H) 08/14/2022   ESRSEDRATE >140 (H) 03/22/2023   CRP 9.7 (H) 03/29/2023   CRP 15.7 (H) 03/28/2023   CRP 19.2 (H) 03/27/2023   REPTSTATUS 04/04/2023 FINAL 03/25/2023   GRAMSTAIN NO WBC SEEN RARE GRAM POSITIVE COCCI IN PAIRS  03/25/2023   CULT  03/25/2023    FEW BACTEROIDES FRAGILIS BETA LACTAMASE POSITIVE FEW SCHAALIA ODONTOLYTICUS Standardized susceptibility testing for this organism is not available. FEW STREPTOCOCCUS INTERMEDIUS Sent to [facility] for further susceptibility testing. SEE SEPARATE REPORT Performed at [facility] Lab, [address], [place], [place] [address]    [facility] STAPHYLOCOCCUS AUREUS 05/30/2019     Lab Results  Component Value Date   ALBUMIN 1.6 (L) 03/24/2023   ALBUMIN 1.8 (L) 03/23/2023   ALBUMIN 2.1 (L) 03/22/2023    Lab Results  Component Value Date   MG 1.5 (L) 04/01/2023   MG 1.8 03/29/2023   MG 1.9 03/28/2023   No results found for: "VD25OH"  No results found for: "PREALBUMIN"    Latest Ref Rng & Units 04/07/2023    3:23 AM 04/06/2023    3:38 AM 04/05/2023    3:14 AM  CBC EXTENDED  WBC 4.0 - 10.5 K/uL 4.8  4.3  5.5  RBC 4.22 - 5.81 MIL/uL 3.07  2.62  3.15   Hemoglobin 13.0 - 17.0 g/dL 8.2  7.0  8.5   HCT 64.4 - 52.0 % 26.6  22.4  27.7   Platelets 150 - 400 K/uL 453  406  523      There is no height or weight on file to calculate BMI.  Orders:  No orders of the defined types were placed in this encounter.  No orders of the defined types were placed in this encounter.    Procedures: No procedures performed  Clinical Data: No additional findings.  ROS:  All other systems negative, except as noted in the HPI. Review of Systems  Objective: Vital Signs: There were no vitals taken for this visit.  Specialty Comments:  No specialty comments  available.  PMFS History: Patient Active Problem List   Diagnosis Date Noted   History of transmetatarsal amputation of right foot (HCC) 04/27/2023   Severe protein-calorie malnutrition (HCC) 03/24/2023   Lymphedema 03/24/2023   Abscess of right foot 03/24/2023   Sinus tachycardia 03/24/2023   Right ventricular enlargement 03/24/2023   Diabetic foot infection (HCC) 03/23/2023   Osteomyelitis (HCC) 03/22/2023   Atrial fibrillation, chronic (HCC) 03/22/2023   HLD (hyperlipidemia) 03/22/2023   Gastroparesis 03/22/2023   Depression 09/08/2022   DKA (diabetic ketoacidosis) (HCC) 08/13/2022   Influenza A 08/13/2022   GERD (gastroesophageal reflux disease) 03/03/2022   Diabetes mellitus (HCC) 09/19/2021   Abscess of neck 09/19/2021   URI (upper respiratory infection) 06/07/2021   PAF (paroxysmal atrial fibrillation) (HCC)    Hypokalemia    Normocytic anemia    Sepsis (HCC) 05/24/2019   Cellulitis of chest wall    Venous insufficiency (chronic) (peripheral) 03/15/2019   Edema of both lower extremities due to peripheral venous insufficiency 03/15/2019   Hypoglycemia due to insulin 12/18/2018   Neck abscess 11/23/2018   Essential hypertension 11/23/2018   AKI (acute kidney injury) (HCC) 11/23/2018   Obesity (BMI 30.0-34.9) 11/23/2018   Type 2 diabetes mellitus with other specified complication (HCC) 11/23/2018   Sepsis due to group B Streptococcus (HCC)    Diabetic infection of left foot (HCC)    Left foot infection    DMII (diabetes mellitus, type 2) (HCC) 07/28/2018   Leukocytosis 07/28/2018   Hyponatremia 07/28/2018   Past Medical History:  Diagnosis Date   Atrial fibrillation (HCC)    Diabetes mellitus without complication (HCC)    Type 2   GERD (gastroesophageal reflux disease)    Wears glasses    Wound, open, foot    left diabetic     Family History  Problem Relation Age of Onset   Diabetes Mother     Past Surgical History:  Procedure Laterality Date    AMPUTATION Left 08/20/2018   Procedure: LEFT FOOT IRRIGATON AND DEBRIDEMENT, 5TH RAY AMPUTATION;  Surgeon: Nadara Mustard, MD;  Location: [facility] OR;  Service: Orthopedics;  Laterality: Left;   AMPUTATION Right 03/25/2023   Procedure: RIGHT TRANSMETATARSAL AMPUTATION;  Surgeon: Nadara Mustard, MD;  Location: [facility] OR;  Service: Orthopedics;  Laterality: Right;   APPLICATION OF A-CELL OF CHEST/ABDOMEN N/A 05/30/2019   Procedure: Application Of A-Cell Of Chest;  Surgeon: Corliss Skains, MD;  Location: [facility] OR;  Service: Cardiothoracic;  Laterality: N/A;   APPLICATION OF WOUND VAC N/A 05/30/2019   Procedure: Application Of Wound Vac;  Surgeon: Corliss Skains, MD;  Location: [facility] OR;  Service: Cardiothoracic;  Laterality: N/A;   FINGER SURGERY Right 2016  I&D  small finger   I & D EXTREMITY Left 07/30/2018   Procedure: IRRIGATION AND DEBRIDEMENT LEFT FOOT WITH POSSIBLE AMPUTATION OF FIFTH TOE;  Surgeon: Kathryne Hitch, MD;  Location[facility]S;  Service: Orthopedics;  Laterality: Left;   INCISION AND DRAINAGE ABSCESS Left 01/19/2019   Procedure: INCISION AND DRAINAGE ABSCESS upper chest;  Surgeon: Christia Reading, MD;  Location[facility] OR;  Service: ENT;  Laterality: Left;   IRRIGATION AND DEBRIDEMENT STERNOCLAVICULAR JOINT-STERNUM AND RIBS N/A 05/30/2019   Procedure: IRRIGATION AND DEBRIDEMENT OF STERNOCLAVICULAR JOINT-STERNUM AND RIBS ;  Surgeon: Corliss Skains, MD;  Location: [facility] OR;  Service: Cardiothoracic;  Laterality: N/A;   MINOR IRRIGATION AND DEBRIDEMENT OF WOUND N/A 11/22/2018   Procedure: INCISION AND DRAINAGE OF NECK ABSCESS;  Surgeon: Christia Reading, MD;  Location[facility]S;  Service: ENT;  Laterality: N/A;   Social History   Occupational History   Not on file  Tobacco Use   Smoking status: Never   Smokeless tobacco: Never  Vaping Use   Vaping status: Never Used  Substance and Sexual Activity   Alcohol use: No    Alcohol/week: 0.0 standard drinks of alcohol   Drug use: No    Sexual activity: Not on file

## 2023-06-05 NOTE — Telephone Encounter (Signed)
Patient is aware 

## 2023-06-08 ENCOUNTER — Other Ambulatory Visit: Payer: Self-pay

## 2023-06-12 ENCOUNTER — Other Ambulatory Visit: Payer: Self-pay | Admitting: Family Medicine

## 2023-06-12 ENCOUNTER — Other Ambulatory Visit: Payer: Self-pay

## 2023-06-12 DIAGNOSIS — Z419 Encounter for procedure for purposes other than remedying health state, unspecified: Secondary | ICD-10-CM | POA: Diagnosis not present

## 2023-06-12 MED ORDER — VALSARTAN 160 MG PO TABS
160.0000 mg | ORAL_TABLET | Freq: Every day | ORAL | 0 refills | Status: DC
  Filled 2023-06-12: qty 90, 90d supply, fill #0

## 2023-06-12 NOTE — Telephone Encounter (Signed)
Requested medications are due for refill today.  unsure  Requested medications are on the active medications list.  yes  Last refill. 05/07/2023   Future visit scheduled.   yes  Notes to clinic.  Please review for refill.    Requested Prescriptions  Pending Prescriptions Disp Refills   valsartan (DIOVAN) 160 MG tablet 30 tablet 0    Sig: Take 1 tablet (160 mg total) by mouth daily.     Cardiovascular:  Angiotensin Receptor Blockers Failed - 06/12/2023  1:13 PM      Failed - Last BP in normal range    BP Readings from Last 1 Encounters:  05/07/23 (!) 169/101         Passed - Cr in normal range and within 180 days    Creatinine, Ser  Date Value Ref Range Status  04/07/2023 1.01 0.61 - 1.24 mg/dL Final         Passed - K in normal range and within 180 days    Potassium  Date Value Ref Range Status  04/07/2023 3.9 3.5 - 5.1 mmol/L Final         Passed - Patient is not pregnant      Passed - Valid encounter within last 6 months    Recent Outpatient Visits           1 month ago Hypertension associated with diabetes (HCC)   [place] [facility] [place], [place], MD   1 month ago Type 2 diabetes mellitus with diabetic polyneuropathy, with long-term current use of insulin (HCC)   [place] [facility] [place], [place], MD   4 months ago Type 2 diabetes mellitus with diabetic polyneuropathy, with long-term current use of insulin (HCC)   [place] [facility] Hoy Register, MD   8 months ago Essential hypertension   [facility] [place], [place] L, RPH-CPP   9 months ago Type 2 diabetes mellitus with diabetic polyneuropathy, with long-term current use of insulin [facility])   [place] [facility] Hoy Register, MD       Future Appointments             In 6 days Hoy Register, MD [facility]   In 3  weeks Adonis Huguenin, NP [facility]

## 2023-06-15 ENCOUNTER — Other Ambulatory Visit: Payer: Self-pay

## 2023-06-18 ENCOUNTER — Ambulatory Visit: Payer: PRIVATE HEALTH INSURANCE | Admitting: Family Medicine

## 2023-06-18 ENCOUNTER — Other Ambulatory Visit: Payer: Self-pay

## 2023-06-19 ENCOUNTER — Other Ambulatory Visit: Payer: Self-pay | Admitting: Family Medicine

## 2023-06-19 ENCOUNTER — Other Ambulatory Visit: Payer: Self-pay | Admitting: Family

## 2023-06-19 ENCOUNTER — Other Ambulatory Visit: Payer: Self-pay

## 2023-06-19 DIAGNOSIS — N528 Other male erectile dysfunction: Secondary | ICD-10-CM

## 2023-06-19 MED ORDER — SILDENAFIL CITRATE 50 MG PO TABS
50.0000 mg | ORAL_TABLET | Freq: Every day | ORAL | 1 refills | Status: DC | PRN
  Filled 2023-06-19 – 2023-06-22 (×2): qty 10, 10d supply, fill #0
  Filled 2023-06-24: qty 10, 30d supply, fill #1
  Filled 2023-06-25 – 2023-06-29 (×2): qty 10, 10d supply, fill #1

## 2023-06-21 NOTE — Progress Notes (Unsigned)
   Established Patient Office Visit  Subjective   Patient ID: Taylor Hughes, male    DOB: 06/08/1971  Age: 52 y.o. MRN: [id number]  No chief complaint on file.   52 y.o.M pcp [facility] Acute OV LBP Last seen [facility] 04/2023 Hypertension Elevated blood pressure despite Valsartan 80mg  daily. Misunderstanding about concurrent use of Metoprolol, which was resumed a few days ago. -Increase to Valsartan 160mg  daily and continue Metoprolol BID. -Counseled on blood pressure goal of less than 130/80, low-sodium, DASH diet, medication compliance, 150 minutes of moderate intensity exercise per week. Discussed medication compliance, adverse effects.   Lymphedema Complaints of swelling in feet, currently on Furosemide 40mg . -Continue Furosemide 40mg  daily.   Pruritus Complaints of itching in the lower back region, no visible rash or skin changes. -Area corresponds with striae -Apply over-the-counter Hydrocortisone cream to affected area.   Stress Patient reports significant stress due to financial and employment issues, which may be contributing to elevated blood pressure. -Encourage stress management techniques and provide support as needed.        {History (Optional):23778}  ROS    Objective:     There were no vitals taken for this visit. {Vitals History (Optional):23777}  Physical Exam   No results found for any visits on 06/25/23.  {Labs (Optional):23779}  The 10-year ASCVD risk score (Arnett DK, et al., 2019) is: 23.6%    Assessment & Plan:   Problem List Items Addressed This Visit   None   No follow-ups on file.    Shan Levans, MD

## 2023-06-22 ENCOUNTER — Other Ambulatory Visit: Payer: Self-pay

## 2023-06-22 MED ORDER — OXYCODONE HCL 5 MG PO TABS
5.0000 mg | ORAL_TABLET | Freq: Two times a day (BID) | ORAL | 0 refills | Status: DC | PRN
  Filled 2023-06-22: qty 14, 7d supply, fill #0

## 2023-06-23 ENCOUNTER — Other Ambulatory Visit: Payer: Self-pay

## 2023-06-24 ENCOUNTER — Other Ambulatory Visit: Payer: Self-pay | Admitting: Orthopedic Surgery

## 2023-06-24 ENCOUNTER — Other Ambulatory Visit: Payer: Self-pay

## 2023-06-25 ENCOUNTER — Ambulatory Visit: Payer: Medicaid Other | Attending: Critical Care Medicine | Admitting: Critical Care Medicine

## 2023-06-25 ENCOUNTER — Other Ambulatory Visit: Payer: Self-pay

## 2023-06-25 ENCOUNTER — Other Ambulatory Visit: Payer: Self-pay | Admitting: Orthopedic Surgery

## 2023-06-25 ENCOUNTER — Encounter: Payer: Self-pay | Admitting: Critical Care Medicine

## 2023-06-25 VITALS — BP 161/96 | HR 83 | Wt 329.2 lb

## 2023-06-25 DIAGNOSIS — E1142 Type 2 diabetes mellitus with diabetic polyneuropathy: Secondary | ICD-10-CM | POA: Diagnosis not present

## 2023-06-25 DIAGNOSIS — N3001 Acute cystitis with hematuria: Secondary | ICD-10-CM

## 2023-06-25 DIAGNOSIS — Z794 Long term (current) use of insulin: Secondary | ICD-10-CM

## 2023-06-25 LAB — POCT GLYCOSYLATED HEMOGLOBIN (HGB A1C): HbA1c, POC (controlled diabetic range): 7.9 % — AB (ref 0.0–7.0)

## 2023-06-25 LAB — GLUCOSE, POCT (MANUAL RESULT ENTRY): POC Glucose: 157 mg/dL — AB (ref 70–99)

## 2023-06-25 NOTE — Patient Instructions (Signed)
Urine sample will be obtained to evaluate for urine infection we will call you results

## 2023-06-25 NOTE — Assessment & Plan Note (Signed)
Suspect acute cystitis plan is obtain urinalysis and urine culture depending upon result may need antibiotic therapy

## 2023-06-26 ENCOUNTER — Other Ambulatory Visit: Payer: Self-pay

## 2023-06-26 MED ORDER — OXYCODONE HCL 5 MG PO TABS
5.0000 mg | ORAL_TABLET | Freq: Two times a day (BID) | ORAL | 0 refills | Status: DC | PRN
  Filled 2023-06-26: qty 14, 7d supply, fill #0

## 2023-06-27 LAB — URINALYSIS
Bilirubin, UA: NEGATIVE
Glucose, UA: NEGATIVE
Ketones, UA: NEGATIVE
Leukocytes,UA: NEGATIVE
Nitrite, UA: NEGATIVE
RBC, UA: NEGATIVE
Specific Gravity, UA: 1.022 (ref 1.005–1.030)
Urobilinogen, Ur: 0.2 mg/dL (ref 0.2–1.0)
pH, UA: 6 (ref 5.0–7.5)

## 2023-06-27 LAB — URINE CULTURE

## 2023-06-27 LAB — CBC WITH DIFFERENTIAL/PLATELET
Basophils Absolute: 0 10*3/uL (ref 0.0–0.2)
Basos: 0 %
EOS (ABSOLUTE): 0.1 10*3/uL (ref 0.0–0.4)
Eos: 2 %
Hematocrit: 35.4 % — ABNORMAL LOW (ref 37.5–51.0)
Hemoglobin: 11.1 g/dL — ABNORMAL LOW (ref 13.0–17.7)
Immature Grans (Abs): 0 10*3/uL (ref 0.0–0.1)
Immature Granulocytes: 0 %
Lymphocytes Absolute: 1.3 10*3/uL (ref 0.7–3.1)
Lymphs: 25 %
MCH: 28.7 pg (ref 26.6–33.0)
MCHC: 31.4 g/dL — ABNORMAL LOW (ref 31.5–35.7)
MCV: 92 fL (ref 79–97)
Monocytes Absolute: 0.6 10*3/uL (ref 0.1–0.9)
Monocytes: 12 %
Neutrophils Absolute: 3.2 10*3/uL (ref 1.4–7.0)
Neutrophils: 61 %
Platelets: 302 10*3/uL (ref 150–450)
RBC: 3.87 x10E6/uL — ABNORMAL LOW (ref 4.14–5.80)
RDW: 14.5 % (ref 11.6–15.4)
WBC: 5.2 10*3/uL (ref 3.4–10.8)

## 2023-06-28 NOTE — Progress Notes (Signed)
Let pt know no bladder infection.   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

## 2023-06-29 ENCOUNTER — Telehealth: Payer: Self-pay

## 2023-06-29 ENCOUNTER — Encounter: Payer: Self-pay | Admitting: Gastroenterology

## 2023-06-29 ENCOUNTER — Other Ambulatory Visit: Payer: Self-pay

## 2023-06-29 ENCOUNTER — Other Ambulatory Visit: Payer: Self-pay | Admitting: Family

## 2023-06-29 NOTE — Telephone Encounter (Signed)
-----   Message from Shan Levans sent at 06/28/2023  7:55 AM EST ----- Let pt know no bladder infection.   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

## 2023-06-29 NOTE — Telephone Encounter (Signed)
Pt was called and vm was left, Information has been sent to nurse pool.

## 2023-06-30 ENCOUNTER — Other Ambulatory Visit: Payer: Self-pay

## 2023-06-30 ENCOUNTER — Other Ambulatory Visit: Payer: Self-pay | Admitting: Family

## 2023-06-30 MED ORDER — AMOXICILLIN 500 MG PO CAPS
500.0000 mg | ORAL_CAPSULE | Freq: Three times a day (TID) | ORAL | 0 refills | Status: DC
  Filled 2023-06-30: qty 18, 6d supply, fill #0

## 2023-07-01 ENCOUNTER — Other Ambulatory Visit: Payer: Self-pay

## 2023-07-02 ENCOUNTER — Other Ambulatory Visit: Payer: Self-pay

## 2023-07-02 ENCOUNTER — Telehealth: Payer: Self-pay | Admitting: Family

## 2023-07-02 MED ORDER — OXYCODONE HCL 5 MG PO TABS
5.0000 mg | ORAL_TABLET | Freq: Two times a day (BID) | ORAL | 0 refills | Status: DC | PRN
  Filled 2023-07-02: qty 14, 7d supply, fill #0

## 2023-07-02 NOTE — Telephone Encounter (Signed)
Sent refill

## 2023-07-02 NOTE — Telephone Encounter (Signed)
Patient called needing refilled Oxycodone. The number to contact patient is [phone]

## 2023-07-07 ENCOUNTER — Other Ambulatory Visit: Payer: Self-pay | Admitting: Family

## 2023-07-07 ENCOUNTER — Ambulatory Visit: Payer: PRIVATE HEALTH INSURANCE | Admitting: Family

## 2023-07-07 ENCOUNTER — Other Ambulatory Visit: Payer: Self-pay

## 2023-07-07 MED ORDER — OXYCODONE HCL 5 MG PO TABS
5.0000 mg | ORAL_TABLET | Freq: Two times a day (BID) | ORAL | 0 refills | Status: DC | PRN
  Filled 2023-07-07 – 2023-07-08 (×2): qty 14, 7d supply, fill #0

## 2023-07-08 ENCOUNTER — Other Ambulatory Visit (HOSPITAL_COMMUNITY): Payer: Self-pay

## 2023-07-08 ENCOUNTER — Telehealth: Payer: Self-pay | Admitting: Family

## 2023-07-08 ENCOUNTER — Other Ambulatory Visit: Payer: Self-pay

## 2023-07-08 NOTE — Telephone Encounter (Signed)
Patient called and needs a refill on Oxycodone. CB#[phone]

## 2023-07-08 NOTE — Telephone Encounter (Signed)
Erin refilled yesterday through e-fax when pharmacy sent request.

## 2023-07-08 NOTE — Telephone Encounter (Signed)
Pt informed

## 2023-07-12 DIAGNOSIS — Z419 Encounter for procedure for purposes other than remedying health state, unspecified: Secondary | ICD-10-CM | POA: Diagnosis not present

## 2023-07-13 ENCOUNTER — Other Ambulatory Visit: Payer: Self-pay | Admitting: Family Medicine

## 2023-07-13 ENCOUNTER — Other Ambulatory Visit: Payer: Self-pay

## 2023-07-13 ENCOUNTER — Other Ambulatory Visit: Payer: Self-pay | Admitting: Family

## 2023-07-13 DIAGNOSIS — N528 Other male erectile dysfunction: Secondary | ICD-10-CM

## 2023-07-14 ENCOUNTER — Other Ambulatory Visit: Payer: Self-pay | Admitting: Family

## 2023-07-14 ENCOUNTER — Other Ambulatory Visit: Payer: Self-pay

## 2023-07-14 ENCOUNTER — Telehealth: Payer: Self-pay | Admitting: Family

## 2023-07-14 MED ORDER — CHLORHEXIDINE GLUCONATE 0.12 % MT SOLN
Freq: Three times a day (TID) | OROMUCOSAL | 0 refills | Status: DC
  Filled 2023-07-14: qty 473, 16d supply, fill #0

## 2023-07-14 MED ORDER — ACETAMINOPHEN-CODEINE 300-30 MG PO TABS
1.0000 | ORAL_TABLET | ORAL | 0 refills | Status: DC | PRN
  Filled 2023-07-14: qty 8, 2d supply, fill #0

## 2023-07-14 MED ORDER — CLINDAMYCIN HCL 300 MG PO CAPS
300.0000 mg | ORAL_CAPSULE | Freq: Three times a day (TID) | ORAL | 0 refills | Status: DC
  Filled 2023-07-14: qty 9, 3d supply, fill #0

## 2023-07-14 NOTE — Telephone Encounter (Signed)
Please see message below and advise.

## 2023-07-14 NOTE — Telephone Encounter (Signed)
Pt called stating that the pharmacy is requesting a new script for oxycodone. Pt states pharmacist told him new script is needed for them to fill the script. Please send new prescription to [facility]. Please call pt when sent at [phone].

## 2023-07-15 ENCOUNTER — Other Ambulatory Visit: Payer: Self-pay

## 2023-07-15 ENCOUNTER — Ambulatory Visit (INDEPENDENT_AMBULATORY_CARE_PROVIDER_SITE_OTHER): Payer: PRIVATE HEALTH INSURANCE | Admitting: Family

## 2023-07-15 ENCOUNTER — Encounter: Payer: Self-pay | Admitting: Family

## 2023-07-15 DIAGNOSIS — Z89431 Acquired absence of right foot: Secondary | ICD-10-CM

## 2023-07-15 DIAGNOSIS — Z6841 Body Mass Index (BMI) 40.0 and over, adult: Secondary | ICD-10-CM | POA: Diagnosis not present

## 2023-07-15 DIAGNOSIS — L859 Epidermal thickening, unspecified: Secondary | ICD-10-CM

## 2023-07-15 DIAGNOSIS — I890 Lymphedema, not elsewhere classified: Secondary | ICD-10-CM

## 2023-07-15 MED ORDER — OXYCODONE HCL 5 MG PO TABS
5.0000 mg | ORAL_TABLET | Freq: Two times a day (BID) | ORAL | 0 refills | Status: DC | PRN
  Filled 2023-07-15: qty 14, 7d supply, fill #0

## 2023-07-15 NOTE — Progress Notes (Signed)
Office Visit Note   Patient: Taylor Hughes           Date of Birth: 1970-12-27           MRN: [id number] Visit Date: 07/15/2023              Requested by: Hoy Register, MD [address] [place],  [place] [address] PCP: Hoy Register, MD  Chief Complaint  Patient presents with   Right Foot - Follow-up    03/25/2023 right TMA      HPI: The patient is a 52 year old gentleman seen status post right transmetatarsal amputation.  His amputation has healed he is currently awaiting lymphedema pumps for his bilateral lower extremity lymphedema the right is significantly worse than the left.  He is also currently awaiting set up with pain management which his primary care has referred him to.  Assessment & Plan: Visit Diagnoses: No diagnosis found.  Plan: He will plan to follow-up in the office with [place] as needed Leah with tactile will do a home demo for his Flexitouch  Follow-Up Instructions: No follow-ups on file.   Ortho Exam  Patient is alert, oriented, no adenopathy, well-dressed, normal affect, normal respiratory effort. On examination bilateral lower extremities there is significant edema bilaterally with lymphedema present papillomas are present there is no weeping there is no open area or sign of infection the transmetatarsal amputation on the right is well-healed there is buildup of hyperkeratotic tissue which is debrided with a 10 blade knife today back to viable tissue there is no underlying open area or drainage  Imaging: No results found. No images are attached to the encounter.  Labs: Lab Results  Component Value Date   HGBA1C 7.9 (A) 06/25/2023   HGBA1C 9.8 (H) 03/23/2023   HGBA1C 9.7 (A) 01/19/2023   ESRSEDRATE >140 (H) 03/22/2023   CRP 9.7 (H) 03/29/2023   CRP 15.7 (H) 03/28/2023   CRP 19.2 (H) 03/27/2023   REPTSTATUS 04/04/2023 FINAL 03/25/2023   GRAMSTAIN NO WBC SEEN RARE GRAM POSITIVE COCCI IN PAIRS  03/25/2023   CULT  03/25/2023    FEW  BACTEROIDES FRAGILIS BETA LACTAMASE POSITIVE FEW SCHAALIA ODONTOLYTICUS Standardized susceptibility testing for this organism is not available. FEW STREPTOCOCCUS INTERMEDIUS Sent to [facility] for further susceptibility testing. SEE SEPARATE REPORT Performed at [facility], [address], [place], [place] [address]    [facility] STAPHYLOCOCCUS AUREUS 05/30/2019     Lab Results  Component Value Date   ALBUMIN 1.6 (L) 03/24/2023   ALBUMIN 1.8 (L) 03/23/2023   ALBUMIN 2.1 (L) 03/22/2023    Lab Results  Component Value Date   MG 1.5 (L) 04/01/2023   MG 1.8 03/29/2023   MG 1.9 03/28/2023   No results found for: "VD25OH"  No results found for: "PREALBUMIN"    Latest Ref Rng & Units 06/25/2023    3:37 PM 04/07/2023    3:23 AM 04/06/2023    3:38 AM  CBC EXTENDED  WBC 3.4 - 10.8 x10E3/uL 5.2  4.8  4.3   RBC 4.14 - 5.80 x10E6/uL 3.87  3.07  2.62   Hemoglobin 13.0 - 17.7 g/dL 82.9  8.2  7.0   HCT 56.2 - 51.0 % 35.4  26.6  22.4   Platelets 150 - 450 x10E3/uL 302  453  406   NEUT# 1.4 - 7.0 x10E3/uL 3.2     Lymph# 0.7 - 3.1 x10E3/uL 1.3        There is no height or weight  on file to calculate BMI.  Orders:  No orders of the defined types were placed in this encounter.  No orders of the defined types were placed in this encounter.    Procedures: No procedures performed  Clinical Data: No additional findings.  ROS:  All other systems negative, except as noted in the HPI. Review of Systems  Objective: Vital Signs: There were no vitals taken for this visit.  Specialty Comments:  No specialty comments available.  PMFS History: Patient Active Problem List   Diagnosis Date Noted   Acute cystitis with hematuria 06/25/2023   History of transmetatarsal amputation of right foot (HCC) 04/27/2023   Lymphedema 03/24/2023   Right ventricular enlargement 03/24/2023   Diabetic foot infection (HCC) 03/23/2023   Osteomyelitis (HCC) 03/22/2023   Atrial fibrillation,  chronic (HCC) 03/22/2023   HLD (hyperlipidemia) 03/22/2023   Gastroparesis 03/22/2023   Depression 09/08/2022   GERD (gastroesophageal reflux disease) 03/03/2022   Diabetes mellitus (HCC) 09/19/2021   PAF (paroxysmal atrial fibrillation) (HCC)    Normocytic anemia    Venous insufficiency (chronic) (peripheral) 03/15/2019   Edema of both lower extremities due to peripheral venous insufficiency 03/15/2019   Essential hypertension 11/23/2018   AKI (acute kidney injury) (HCC) 11/23/2018   Morbid obesity with BMI of 45.0-49.9, adult (HCC) 11/23/2018   Type 2 diabetes mellitus with other specified complication (HCC) 11/23/2018   Diabetic infection of left foot (HCC)    DMII (diabetes mellitus, type 2) (HCC) 07/28/2018   Past Medical History:  Diagnosis Date   Atrial fibrillation (HCC)    Diabetes mellitus without complication (HCC)    Type 2   GERD (gastroesophageal reflux disease)    Wears glasses    Wound, open, foot    left diabetic     Family History  Problem Relation Age of Onset   Diabetes Mother     Past Surgical History:  Procedure Laterality Date   AMPUTATION Left 08/20/2018   Procedure: LEFT FOOT IRRIGATON AND DEBRIDEMENT, 5TH RAY AMPUTATION;  Surgeon: Nadara Mustard, MD;  Location: [facility] OR;  Service: Orthopedics;  Laterality: Left;   AMPUTATION Right 03/25/2023   Procedure: RIGHT TRANSMETATARSAL AMPUTATION;  Surgeon: Nadara Mustard, MD;  Location: [facility] OR;  Service: Orthopedics;  Laterality: Right;   APPLICATION OF A-CELL OF CHEST/ABDOMEN N/A 05/30/2019   Procedure: Application Of A-Cell Of Chest;  Surgeon: Corliss Skains, MD;  Location: [facility] OR;  Service: Cardiothoracic;  Laterality: N/A;   APPLICATION OF WOUND VAC N/A 05/30/2019   Procedure: Application Of Wound Vac;  Surgeon: Corliss Skains, MD;  Location: [facility] OR;  Service: Cardiothoracic;  Laterality: N/A;   FINGER SURGERY Right 2016   I&D  small finger   I & D EXTREMITY Left 07/30/2018   Procedure: IRRIGATION  AND DEBRIDEMENT LEFT FOOT WITH POSSIBLE AMPUTATION OF FIFTH TOE;  Surgeon: Kathryne Hitch, MD;  Location: [facility] ORS;  Service: Orthopedics;  Laterality: Left;   INCISION AND DRAINAGE ABSCESS Left 01/19/2019   Procedure: INCISION AND DRAINAGE ABSCESS upper chest;  Surgeon: Christia Reading, MD;  Location: [facility] OR;  Service: ENT;  Laterality: Left;   IRRIGATION AND DEBRIDEMENT STERNOCLAVICULAR JOINT-STERNUM AND RIBS N/A 05/30/2019   Procedure: IRRIGATION AND DEBRIDEMENT OF STERNOCLAVICULAR JOINT-STERNUM AND RIBS ;  Surgeon: Corliss Skains, MD;  Location: [facility] OR;  Service: Cardiothoracic;  Laterality: N/A;   MINOR IRRIGATION AND DEBRIDEMENT OF WOUND N/A 11/22/2018   Procedure: INCISION AND DRAINAGE OF NECK ABSCESS;  Surgeon: Christia Reading, MD;  Location: [facility];  Service: ENT;  Laterality: N/A;   Social History   Occupational History   Not on file  Tobacco Use   Smoking status: Never   Smokeless tobacco: Never  Vaping Use   Vaping status: Never Used  Substance and Sexual Activity   Alcohol use: No    Alcohol/week: 0.0 standard drinks of alcohol   Drug use: No   Sexual activity: Not on file

## 2023-07-17 ENCOUNTER — Other Ambulatory Visit: Payer: Self-pay | Admitting: Family Medicine

## 2023-07-17 DIAGNOSIS — Z89421 Acquired absence of other right toe(s): Secondary | ICD-10-CM | POA: Diagnosis not present

## 2023-07-22 ENCOUNTER — Other Ambulatory Visit: Payer: Self-pay

## 2023-07-22 ENCOUNTER — Other Ambulatory Visit: Payer: Self-pay | Admitting: Family Medicine

## 2023-07-22 ENCOUNTER — Other Ambulatory Visit: Payer: Self-pay | Admitting: Family

## 2023-07-22 DIAGNOSIS — N528 Other male erectile dysfunction: Secondary | ICD-10-CM

## 2023-07-22 DIAGNOSIS — Z794 Long term (current) use of insulin: Secondary | ICD-10-CM

## 2023-07-22 MED ORDER — SILDENAFIL CITRATE 50 MG PO TABS
50.0000 mg | ORAL_TABLET | Freq: Every day | ORAL | 1 refills | Status: DC | PRN
  Filled 2023-07-22: qty 10, 10d supply, fill #0
  Filled 2023-08-17: qty 10, 10d supply, fill #1

## 2023-07-22 MED ORDER — INSULIN LISPRO (1 UNIT DIAL) 100 UNIT/ML (KWIKPEN)
0.0000 [IU] | PEN_INJECTOR | Freq: Three times a day (TID) | SUBCUTANEOUS | 3 refills | Status: AC
Start: 2023-07-22 — End: ?
  Filled 2023-07-22: qty 12, 34d supply, fill #0
  Filled 2023-08-17: qty 12, 34d supply, fill #1
  Filled 2023-10-05: qty 12, 34d supply, fill #2
  Filled 2023-11-04 – 2023-12-28 (×2): qty 12, 34d supply, fill #3

## 2023-07-23 ENCOUNTER — Other Ambulatory Visit: Payer: Self-pay

## 2023-07-24 ENCOUNTER — Other Ambulatory Visit: Payer: Self-pay

## 2023-07-24 ENCOUNTER — Ambulatory Visit: Payer: PRIVATE HEALTH INSURANCE | Admitting: *Deleted

## 2023-07-24 VITALS — Ht 71.0 in | Wt 320.0 lb

## 2023-07-24 DIAGNOSIS — Z1211 Encounter for screening for malignant neoplasm of colon: Secondary | ICD-10-CM

## 2023-07-24 MED ORDER — PEG 3350-KCL-NA BICARB-NACL 420 G PO SOLR
4000.0000 mL | Freq: Once | ORAL | 0 refills | Status: AC
  Filled 2023-07-24: qty 4000, 1d supply, fill #0

## 2023-07-24 NOTE — Progress Notes (Signed)
Pre visit completed over telephone.  Instructions mailed.   No egg or soy allergy known to patient  No issues known to pt with past sedation with any surgeries or procedures Patient denies ever being told they had issues or difficulty with intubation  No FH of Malignant Hyperthermia Pt is not on diet pills Pt is not on  home 02  Pt is not on blood thinners  Pt denies issues with constipation  No A fib or A flutter-- YES has chronic a fib Have any cardiac testing pending--NO Pt instructed to use [email or web address] or GoodRx for a price reduction on prep

## 2023-08-02 ENCOUNTER — Encounter: Payer: Self-pay | Admitting: Certified Registered Nurse Anesthetist

## 2023-08-03 ENCOUNTER — Other Ambulatory Visit: Payer: Self-pay

## 2023-08-04 ENCOUNTER — Other Ambulatory Visit: Payer: Self-pay

## 2023-08-04 MED ORDER — ACCU-CHEK GUIDE TEST VI STRP
1.0000 | ORAL_STRIP | Freq: Three times a day (TID) | 5 refills | Status: DC
  Filled 2023-08-04: qty 100, 34d supply, fill #0
  Filled 2023-08-26 (×2): qty 100, 34d supply, fill #1
  Filled 2023-10-05: qty 100, 34d supply, fill #2
  Filled 2023-11-04 – 2023-12-28 (×2): qty 100, 34d supply, fill #3

## 2023-08-07 ENCOUNTER — Ambulatory Visit (AMBULATORY_SURGERY_CENTER): Payer: PRIVATE HEALTH INSURANCE | Admitting: Gastroenterology

## 2023-08-07 ENCOUNTER — Encounter: Payer: Self-pay | Admitting: Gastroenterology

## 2023-08-07 VITALS — BP 173/99 | HR 92 | Temp 98.2°F | Resp 22 | Ht 71.0 in | Wt 320.0 lb

## 2023-08-07 DIAGNOSIS — K644 Residual hemorrhoidal skin tags: Secondary | ICD-10-CM

## 2023-08-07 DIAGNOSIS — I10 Essential (primary) hypertension: Secondary | ICD-10-CM | POA: Diagnosis not present

## 2023-08-07 DIAGNOSIS — Z1211 Encounter for screening for malignant neoplasm of colon: Secondary | ICD-10-CM | POA: Diagnosis not present

## 2023-08-07 DIAGNOSIS — K648 Other hemorrhoids: Secondary | ICD-10-CM

## 2023-08-07 DIAGNOSIS — K635 Polyp of colon: Secondary | ICD-10-CM

## 2023-08-07 DIAGNOSIS — D125 Benign neoplasm of sigmoid colon: Secondary | ICD-10-CM | POA: Diagnosis not present

## 2023-08-07 DIAGNOSIS — K5730 Diverticulosis of large intestine without perforation or abscess without bleeding: Secondary | ICD-10-CM | POA: Diagnosis not present

## 2023-08-07 DIAGNOSIS — E119 Type 2 diabetes mellitus without complications: Secondary | ICD-10-CM | POA: Diagnosis not present

## 2023-08-07 DIAGNOSIS — I4891 Unspecified atrial fibrillation: Secondary | ICD-10-CM | POA: Diagnosis not present

## 2023-08-07 HISTORY — DX: Morbid (severe) obesity due to excess calories: E66.01

## 2023-08-07 MED ORDER — SODIUM CHLORIDE 0.9 % IV SOLN: 500.0000 mL | Freq: Once | INTRAVENOUS | Status: DC

## 2023-08-07 NOTE — Progress Notes (Signed)
0945 BP 187/116, Labetalol given IV, MD update, vss

## 2023-08-07 NOTE — Op Note (Signed)
[facility] Patient Name: Taylor Hughes Procedure Date: 08/07/2023 9:24 AM MRN: [id number] Endoscopist: Napoleon Form , MD, [id number] Age: 52 Referring MD:  Date of Birth: 03/23/1971 Gender: Male Account #: [id number] Procedure:                Colonoscopy Indications:              Screening for colorectal malignant neoplasm Medicines:                Monitored Anesthesia Care Procedure:                Pre-Anesthesia Assessment:                           - Prior to the procedure, a History and Physical                            was performed, and patient medications and                            allergies were reviewed. The patient's tolerance of                            previous anesthesia was also reviewed. The risks                            and benefits of the procedure and the sedation                            options and risks were discussed with the patient.                            All questions were answered, and informed consent                            was obtained. Prior Anticoagulants: The patient has                            taken no anticoagulant or antiplatelet agents. ASA                            Grade Assessment: III - A patient with severe                            systemic disease. After reviewing the risks and                            benefits, the patient was deemed in satisfactory                            condition to undergo the procedure.                           After obtaining informed consent, the colonoscope  was passed under direct vision. Throughout the                            procedure, the patient's blood pressure, pulse, and                            oxygen saturations were monitored continuously. The                            [id number]-HQ190L Colonoscope [id number] was introduced                            through the anus and advanced to the the cecum,                            identified by  appendiceal orifice and ileocecal                            valve. The colonoscopy was performed without                            difficulty. The patient tolerated the procedure                            well. The quality of the bowel preparation was                            good. The ileocecal valve, appendiceal orifice, and                            rectum were photographed. Scope In: 9:37:16 AM Scope Out: 9:54:13 AM Scope Withdrawal Time: 0 hours 8 minutes 45 seconds  Total Procedure Duration: 0 hours 16 minutes 57 seconds  Findings:                 The perianal and digital rectal examinations were                            normal.                           A 3 mm polyp was found in the sigmoid colon. The                            polyp was sessile. The polyp was removed with a                            cold snare. Resection and retrieval were complete.                           A few small-mouthed diverticula were found in the                            sigmoid colon.  Non-bleeding external and internal hemorrhoids were                            found during retroflexion. The hemorrhoids were                            medium-sized. Complications:            No immediate complications. Estimated Blood Loss:     Estimated blood loss was minimal. Impression:               - One 3 mm polyp in the sigmoid colon, removed with                            a cold snare. Resected and retrieved.                           - Diverticulosis in the sigmoid colon.                           - Non-bleeding external and internal hemorrhoids. Recommendation:           - Patient has a contact number available for                            emergencies. The signs and symptoms of potential                            delayed complications were discussed with the                            patient. Return to normal activities tomorrow.                            Written  discharge instructions were provided to the                            patient.                           - Resume previous diet.                           - Continue present medications.                           - Await pathology results.                           - Repeat colonoscopy in 5-10 years for surveillance                            based on pathology results. Napoleon Form, MD 08/07/2023 10:03:00 AM This report has been signed electronically.

## 2023-08-07 NOTE — Progress Notes (Signed)
Pt's states no medical or surgical changes since previsit or office visit. 

## 2023-08-07 NOTE — Patient Instructions (Addendum)
Resume previous diet Continue present medications  Handouts/information given for polyps, diverticulosis and hemorrhoids  - Repeat colonoscopy in 5-10 years for surveillance based on pathology results.   YOU HAD AN ENDOSCOPIC PROCEDURE TODAY AT THE [facility]:   Refer to the procedure report that was given to you for any specific questions about what was found during the examination.  If the procedure report does not answer your questions, please call your gastroenterologist to clarify.  If you requested that your care partner not be given the details of your procedure findings, then the procedure report has been included in a sealed envelope for you to review at your convenience later.  YOU SHOULD EXPECT: Some feelings of bloating in the abdomen. Passage of more gas than usual.  Walking can help get rid of the air that was put into your GI tract during the procedure and reduce the bloating. If you had a lower endoscopy (such as a colonoscopy or flexible sigmoidoscopy) you may notice spotting of blood in your stool or on the toilet paper. If you underwent a bowel prep for your procedure, you may not have a normal bowel movement for a few days.  Please Note:  You might notice some irritation and congestion in your nose or some drainage.  This is from the oxygen used during your procedure.  There is no need for concern and it should clear up in a day or so.  SYMPTOMS TO REPORT IMMEDIATELY:  Following lower endoscopy (colonoscopy or flexible sigmoidoscopy):  Excessive amounts of blood in the stool  Significant tenderness or worsening of abdominal pains  Swelling of the abdomen that is new, acute  Fever of 100F or higher  For urgent or emergent issues, a gastroenterologist can be reached at any hour by calling [phone]. Do not use MyChart messaging for urgent concerns.    DIET:  We do recommend a small meal at first, but then you may proceed to your regular diet.  Drink  plenty of fluids but you should avoid alcoholic beverages for 24 hours.  ACTIVITY:  You should plan to take it easy for the rest of today and you should NOT DRIVE or use heavy machinery until tomorrow (because of the sedation medicines used during the test).    FOLLOW UP: Our staff will call the number listed on your records the next business day following your procedure.  We will call around 7:15- 8:00 am to check on you and address any questions or concerns that you may have regarding the information given to you following your procedure. If we do not reach you, we will leave a message.     If any biopsies were taken you will be contacted by phone or by letter within the next 1-3 weeks.  Please call us at [phone] if you have not heard about the biopsies in 3 weeks.    SIGNATURES/CONFIDENTIALITY: You and/or your care partner have signed paperwork which will be entered into your electronic medical record.  These signatures attest to the fact that that the information above on your After Visit Summary has been reviewed and is understood.  Full responsibility of the confidentiality of this discharge information lies with you and/or your care-partner.

## 2023-08-07 NOTE — Progress Notes (Signed)
0938 BP 183/113, Labetalol given IV, MD update, vss

## 2023-08-07 NOTE — Progress Notes (Signed)
[employer] [facility] History and Physical   Primary Care Physician:  Hoy Register, MD   Reason for Procedure:  Colorectal cancer screening  Plan:    Screening colonoscopy with possible interventions as needed     HPI: Taylor Hughes is a very pleasant 52 y.o. male here for screening colonoscopy. Denies any nausea, vomiting, abdominal pain, melena or bright red blood per rectum  The risks and benefits as well as alternatives of endoscopic procedure(s) have been discussed and reviewed. All questions answered. The patient agrees to proceed.    Past Medical History:  Diagnosis Date   Atrial fibrillation (HCC)    Diabetes mellitus without complication (HCC)    Type 2   GERD (gastroesophageal reflux disease)    Hyperlipidemia    Hypertension    Wears glasses    Wound, open, foot    left diabetic     Past Surgical History:  Procedure Laterality Date   AMPUTATION Left 08/20/2018   Procedure: LEFT FOOT IRRIGATON AND DEBRIDEMENT, 5TH RAY AMPUTATION;  Surgeon: Nadara Mustard, MD;  Location: [facility] OR;  Service: Orthopedics;  Laterality: Left;   AMPUTATION Right 03/25/2023   Procedure: RIGHT TRANSMETATARSAL AMPUTATION;  Surgeon: Nadara Mustard, MD;  Location[facility] OR;  Service: Orthopedics;  Laterality: Right;   APPLICATION OF A-CELL OF CHEST/ABDOMEN N/A 05/30/2019   Procedure: Application Of A-Cell Of Chest;  Surgeon: Corliss Skains, MD;  Location: [facility] OR;  Service: Cardiothoracic;  Laterality: N/A;   APPLICATION OF WOUND VAC N/A 05/30/2019   Procedure: Application Of Wound Vac;  Surgeon: Corliss Skains, MD;  Location: [facility] OR;  Service: Cardiothoracic;  Laterality: N/A;   FINGER SURGERY Right 2016   I&D  small finger   I & D EXTREMITY Left 07/30/2018   Procedure: IRRIGATION AND DEBRIDEMENT LEFT FOOT WITH POSSIBLE AMPUTATION OF FIFTH TOE;  Surgeon: Kathryne Hitch, MD;  Location: [facility] ORS;  Service: Orthopedics;  Laterality: Left;   INCISION AND DRAINAGE ABSCESS Left  01/19/2019   Procedure: INCISION AND DRAINAGE ABSCESS upper chest;  Surgeon: Christia Reading, MD;  Location: [facility] OR;  Service: ENT;  Laterality: Left;   IRRIGATION AND DEBRIDEMENT STERNOCLAVICULAR JOINT-STERNUM AND RIBS N/A 05/30/2019   Procedure: IRRIGATION AND DEBRIDEMENT OF STERNOCLAVICULAR JOINT-STERNUM AND RIBS ;  Surgeon: Corliss Skains, MD;  Location: [facility] OR;  Service: Cardiothoracic;  Laterality: N/A;   MINOR IRRIGATION AND DEBRIDEMENT OF WOUND N/A 11/22/2018   Procedure: INCISION AND DRAINAGE OF NECK ABSCESS;  Surgeon: Christia Reading, MD;  Location: [facility] ORS;  Service: ENT;  Laterality: N/A;    Prior to Admission medications   Medication Sig Start Date End Date Taking? Authorizing Provider  Accu-Chek Softclix Lancets lancets Use to check blood sugar 3 times daily. 06/02/23  Yes Hoy Register, MD  amoxicillin (AMOXIL) 500 MG capsule Take 1 capsule (500 mg total) by mouth 3 (three) times daily. 06/30/23  Yes   atorvastatin (LIPITOR) 20 MG tablet Take 1 tablet (20 mg total) by mouth daily. 01/19/23  Yes Hoy Register, MD  Blood Glucose Monitoring Suppl (ACCU-CHEK GUIDE) w/Device KIT Use to check blood sugar 3 times daily. 06/02/23  Yes Hoy Register, MD  clindamycin (CLEOCIN) 300 MG capsule Take 1 capsule (300 mg total) by mouth 3 (three) times daily with glass of water. 07/13/23  Yes   folic acid (FOLVITE) 1 MG tablet Take 1 tablet (1 mg total) by mouth daily. 04/08/23  Yes Elgergawy, Leana Roe, MD  gabapentin (NEURONTIN) 300 MG capsule Take 1 capsule (300  mg total) by mouth 2 (two) times daily. Patient taking differently: Take 300 mg by mouth 3 (three) times daily. 01/19/23  Yes Newlin, Enobong, MD  glucose blood (ACCU-CHEK GUIDE TEST) test strip use as directed 3 (three) times daily. 06/02/23  Yes Newlin, Odette Horns, MD  glucose blood (ACCU-CHEK GUIDE) test strip Use to check blood sugar 3 times daily. 06/02/23  Yes Newlin, Odette Horns, MD  insulin glargine (LANTUS SOLOSTAR) 100 UNIT/ML Solostar  Pen Inject 10 Units into the skin daily. 06/02/23  Yes Newlin, Odette Horns, MD  insulin lispro (HUMALOG KWIKPEN) 100 UNIT/ML KwikPen Inject 0-12 units into the skin three times daily with meals. Per sliding scale. 07/22/23  Yes Hoy Register, MD  Insulin Pen Needle 32G X 6 MM MISC USE AS DIRECTED 11/17/22  Yes Newlin, Enobong, MD  Lactobacillus (ACIDOPHILUS) CAPS capsule Take 2 capsules by mouth daily. 04/08/23  Yes Elgergawy, Leana Roe, MD  metoprolol tartrate (LOPRESSOR) 25 MG tablet Take 1 tablet (25 mg total) by mouth 2 (two) times daily. 05/14/23  Yes Hoy Register, MD  omeprazole (PRILOSEC) 40 MG capsule Take 1 capsule (40 mg total) by mouth daily. 01/19/23  Yes Hoy Register, MD  valsartan (DIOVAN) 160 MG tablet Take 1 tablet (160 mg total) by mouth daily. 06/12/23  Yes Hoy Register, MD  acetaminophen-codeine (TYLENOL #3) 300-30 MG tablet Take 1 tablet by mouth every 4 (four) hours as needed for pain. Patient not taking: Reported on 08/07/2023 07/13/23     albuterol (VENTOLIN HFA) 108 (90 Base) MCG/ACT inhaler Inhale 2 puffs into the lungs every 6 (six) hours as needed for wheezing or shortness of breath. Patient not taking: Reported on 07/24/2023 10/07/22   Hoy Register, MD  aspirin EC 81 MG tablet Take 1 tablet (81 mg total) by mouth daily. Swallow whole. Patient not taking: Reported on 08/07/2023 04/07/23   Elgergawy, Leana Roe, MD  buPROPion (WELLBUTRIN XL) 150 MG 24 hr tablet Take 1 tablet (150 mg total) by mouth daily. Patient not taking: Reported on 08/07/2023 04/07/23   Elgergawy, Leana Roe, MD  chlorhexidine (PERIDEX) 0.12 % solution Swish 1 capful by mouth for 30 seconds, then spit three times daily. Patient not taking: Reported on 08/07/2023 07/13/23     furosemide (LASIX) 20 MG tablet Take 2 tablets (40 mg total) by mouth daily. Patient not taking: Reported on 08/07/2023 05/14/23   Hoy Register, MD  hydrocortisone cream 0.5 % Apply 1 Application topically 2 (two) times  daily. Patient not taking: Reported on 08/07/2023 05/07/23   Hoy Register, MD  oxyCODONE (OXY IR/ROXICODONE) 5 MG immediate release tablet Take 1 tablet (5 mg total) by mouth every 12 (twelve) hours as needed for moderate pain (pain score 4-6) or severe pain (pain score 7-10). Patient not taking: Reported on 08/07/2023 07/15/23   Adonis Huguenin, NP  sildenafil (VIAGRA) 50 MG tablet Take 1 tablet (50 mg total) by mouth daily as needed for erectile dysfunction. At least 24 hours between doses 07/22/23   Hoy Register, MD    Current Outpatient Medications  Medication Sig Dispense Refill   Accu-Chek Softclix Lancets lancets Use to check blood sugar 3 times daily. 100 each 6   amoxicillin (AMOXIL) 500 MG capsule Take 1 capsule (500 mg total) by mouth 3 (three) times daily. 18 capsule 0   atorvastatin (LIPITOR) 20 MG tablet Take 1 tablet (20 mg total) by mouth daily. 90 tablet 1   Blood Glucose Monitoring Suppl (ACCU-CHEK GUIDE) w/Device KIT Use to check blood sugar 3 times  daily. 1 kit 0   clindamycin (CLEOCIN) 300 MG capsule Take 1 capsule (300 mg total) by mouth 3 (three) times daily with glass of water. 9 capsule 0   folic acid (FOLVITE) 1 MG tablet Take 1 tablet (1 mg total) by mouth daily. 30 tablet 1   gabapentin (NEURONTIN) 300 MG capsule Take 1 capsule (300 mg total) by mouth 2 (two) times daily. (Patient taking differently: Take 300 mg by mouth 3 (three) times daily.) 180 capsule 1   glucose blood (ACCU-CHEK GUIDE TEST) test strip use as directed 3 (three) times daily. 100 strip 5   glucose blood (ACCU-CHEK GUIDE) test strip Use to check blood sugar 3 times daily. 100 each 6   insulin glargine (LANTUS SOLOSTAR) 100 UNIT/ML Solostar Pen Inject 10 Units into the skin daily. 3 mL 2   insulin lispro (HUMALOG KWIKPEN) 100 UNIT/ML KwikPen Inject 0-12 units into the skin three times daily with meals. Per sliding scale. 12 mL 3   Insulin Pen Needle 32G X 6 MM MISC USE AS DIRECTED 100 each 0    Lactobacillus (ACIDOPHILUS) CAPS capsule Take 2 capsules by mouth daily. 60 capsule 1   metoprolol tartrate (LOPRESSOR) 25 MG tablet Take 1 tablet (25 mg total) by mouth 2 (two) times daily. 180 tablet 1   omeprazole (PRILOSEC) 40 MG capsule Take 1 capsule (40 mg total) by mouth daily. 90 capsule 1   valsartan (DIOVAN) 160 MG tablet Take 1 tablet (160 mg total) by mouth daily. 90 tablet 0   acetaminophen-codeine (TYLENOL #3) 300-30 MG tablet Take 1 tablet by mouth every 4 (four) hours as needed for pain. (Patient not taking: Reported on 08/07/2023) 8 tablet 0   albuterol (VENTOLIN HFA) 108 (90 Base) MCG/ACT inhaler Inhale 2 puffs into the lungs every 6 (six) hours as needed for wheezing or shortness of breath. (Patient not taking: Reported on 07/24/2023) 6.7 g 0   aspirin EC 81 MG tablet Take 1 tablet (81 mg total) by mouth daily. Swallow whole. (Patient not taking: Reported on 08/07/2023)     buPROPion (WELLBUTRIN XL) 150 MG 24 hr tablet Take 1 tablet (150 mg total) by mouth daily. (Patient not taking: Reported on 08/07/2023) 30 tablet 1   chlorhexidine (PERIDEX) 0.12 % solution Swish 1 capful by mouth for 30 seconds, then spit three times daily. (Patient not taking: Reported on 08/07/2023) 473 mL 0   furosemide (LASIX) 20 MG tablet Take 2 tablets (40 mg total) by mouth daily. (Patient not taking: Reported on 08/07/2023) 180 tablet 1   hydrocortisone cream 0.5 % Apply 1 Application topically 2 (two) times daily. (Patient not taking: Reported on 08/07/2023) 28.4 g 1   oxyCODONE (OXY IR/ROXICODONE) 5 MG immediate release tablet Take 1 tablet (5 mg total) by mouth every 12 (twelve) hours as needed for moderate pain (pain score 4-6) or severe pain (pain score 7-10). (Patient not taking: Reported on 08/07/2023) 14 tablet 0   sildenafil (VIAGRA) 50 MG tablet Take 1 tablet (50 mg total) by mouth daily as needed for erectile dysfunction. At least 24 hours between doses 10 tablet 1   Current  Facility-Administered Medications  Medication Dose Route Frequency Provider Last Rate Last Admin   0.9 %  sodium chloride infusion  500 mL Intravenous Once Napoleon Form, MD        Allergies as of 08/07/2023   (No Known Allergies)    Family History  Problem Relation Age of Onset   Diabetes Mother  Colon cancer Brother    Esophageal cancer Neg Hx    Rectal cancer Neg Hx    Stomach cancer Neg Hx     Social History   Socioeconomic History   Marital status: Divorced    Spouse name: Not on file   Number of children: Not on file   Years of education: Not on file   Highest education level: 11th grade  Occupational History   Not on file  Tobacco Use   Smoking status: Never   Smokeless tobacco: Never  Vaping Use   Vaping status: Never Used  Substance and Sexual Activity   Alcohol use: No    Alcohol/week: 0.0 standard drinks of alcohol   Drug use: No   Sexual activity: Not on file  Other Topics Concern   Not on file  Social History Narrative   Not on file   Social Drivers of Health   Financial Resource Strain: Medium Risk (06/24/2023)   Overall Financial Resource Strain (CARDIA)    Difficulty of Paying Living Expenses: Somewhat hard  Food Insecurity: Food Insecurity Present (06/24/2023)   Hunger Vital Sign    Worried About Running Out of Food in the Last Year: Often true    Ran Out of Food in the Last Year: Sometimes true  Transportation Needs: No Transportation Needs (06/24/2023)   PRAPARE - Administrator, Civil Service (Medical): No    Lack of Transportation (Non-Medical): No  Physical Activity: Unknown (06/24/2023)   Exercise Vital Sign    Days of Exercise per Week: 0 days    Minutes of Exercise per Session: Not on file  Stress: No Stress Concern Present (06/24/2023)   [employer] of Occupational Health - Occupational Stress Questionnaire    Feeling of Stress : Only a little  Social Connections: Socially Isolated (06/24/2023)   Social  Connection and Isolation Panel [NHANES]    Frequency of Communication with Friends and Family: Twice a week    Frequency of Social Gatherings with Friends and Family: Once a week    Attends Religious Services: Never    Database administrator or Organizations: No    Attends Engineer, structural: Not on file    Marital Status: Divorced  Intimate Partner Violence: Not At Risk (03/23/2023)   Humiliation, Afraid, Rape, and Kick questionnaire    Fear of Current or Ex-Partner: No    Emotionally Abused: No    Physically Abused: No    Sexually Abused: No    Review of Systems:  All other review of systems negative except as mentioned in the HPI.  Physical Exam: Vital signs in last 24 hours: BP (!) 162/90   Pulse 91   Temp 98.2 F (36.8 C) (Temporal)   Ht 5\' 11"  (1.803 m)   Wt (!) 320 lb (145.2 kg)   SpO2 97%   BMI 44.63 kg/m  General:   Alert, NAD Lungs:  Clear .   Heart:  Regular rate and rhythm Abdomen:  Soft, nontender and nondistended. Neuro/Psych:  Alert and cooperative. Normal mood and affect. A and O x 3  Reviewed labs, radiology imaging, old records and pertinent past GI work up  Patient is appropriate for planned procedure(s) and anesthesia in an ambulatory setting   K. Scherry Ran , MD [phone]

## 2023-08-07 NOTE — Progress Notes (Signed)
0950 IV infiltrated and d/c'd.  Warm compress on site of IV.

## 2023-08-07 NOTE — Progress Notes (Signed)
Report given to PACU, vss 

## 2023-08-10 ENCOUNTER — Telehealth: Payer: Self-pay

## 2023-08-10 DIAGNOSIS — I890 Lymphedema, not elsewhere classified: Secondary | ICD-10-CM | POA: Diagnosis not present

## 2023-08-10 NOTE — Telephone Encounter (Signed)
  Follow up Call-     08/07/2023    9:03 AM  Call back number  Post procedure Call Back phone  # [phone]  Permission to leave phone message Yes     Patient questions:  Do you have a fever, pain , or abdominal swelling? No. Pain Score  0 *  Have you tolerated food without any problems? Yes.    Have you been able to return to your normal activities? Yes.    Do you have any questions about your discharge instructions: Diet   No. Medications  No. Follow up visit  No.  Do you have questions or concerns about your Care? No.  Actions: * If pain score is 4 or above: No action needed, pain <4.

## 2023-08-11 ENCOUNTER — Ambulatory Visit: Payer: PRIVATE HEALTH INSURANCE | Admitting: Nurse Practitioner

## 2023-08-12 DIAGNOSIS — Z419 Encounter for procedure for purposes other than remedying health state, unspecified: Secondary | ICD-10-CM | POA: Diagnosis not present

## 2023-08-13 LAB — SURGICAL PATHOLOGY

## 2023-08-17 ENCOUNTER — Other Ambulatory Visit: Payer: Self-pay

## 2023-08-26 ENCOUNTER — Ambulatory Visit: Payer: PRIVATE HEALTH INSURANCE | Admitting: Physician Assistant

## 2023-08-26 ENCOUNTER — Other Ambulatory Visit: Payer: Self-pay

## 2023-08-27 DIAGNOSIS — G894 Chronic pain syndrome: Secondary | ICD-10-CM | POA: Diagnosis not present

## 2023-08-27 DIAGNOSIS — R202 Paresthesia of skin: Secondary | ICD-10-CM | POA: Diagnosis not present

## 2023-08-27 DIAGNOSIS — G90513 Complex regional pain syndrome I of upper limb, bilateral: Secondary | ICD-10-CM | POA: Diagnosis not present

## 2023-08-27 DIAGNOSIS — Q820 Hereditary lymphedema: Secondary | ICD-10-CM | POA: Diagnosis not present

## 2023-08-27 DIAGNOSIS — Z79891 Long term (current) use of opiate analgesic: Secondary | ICD-10-CM | POA: Diagnosis not present

## 2023-08-31 ENCOUNTER — Other Ambulatory Visit: Payer: Self-pay

## 2023-09-01 ENCOUNTER — Encounter: Payer: Self-pay | Admitting: Pharmacist

## 2023-09-01 ENCOUNTER — Other Ambulatory Visit: Payer: Self-pay

## 2023-09-01 ENCOUNTER — Other Ambulatory Visit: Payer: Self-pay | Admitting: Family

## 2023-09-03 ENCOUNTER — Other Ambulatory Visit: Payer: Self-pay

## 2023-09-03 MED ORDER — METHYLPREDNISOLONE 4 MG PO TBPK
ORAL_TABLET | ORAL | 0 refills | Status: DC
  Filled 2023-09-03: qty 21, 6d supply, fill #0

## 2023-09-07 ENCOUNTER — Other Ambulatory Visit: Payer: Self-pay

## 2023-09-08 ENCOUNTER — Other Ambulatory Visit: Payer: Self-pay

## 2023-09-08 ENCOUNTER — Ambulatory Visit: Payer: PRIVATE HEALTH INSURANCE | Admitting: Family Medicine

## 2023-09-08 ENCOUNTER — Encounter: Payer: Self-pay | Admitting: Gastroenterology

## 2023-09-12 DIAGNOSIS — Z419 Encounter for procedure for purposes other than remedying health state, unspecified: Secondary | ICD-10-CM | POA: Diagnosis not present

## 2023-09-14 ENCOUNTER — Other Ambulatory Visit: Payer: Self-pay | Admitting: Family Medicine

## 2023-09-14 ENCOUNTER — Other Ambulatory Visit: Payer: Self-pay

## 2023-09-14 DIAGNOSIS — N528 Other male erectile dysfunction: Secondary | ICD-10-CM

## 2023-09-15 ENCOUNTER — Other Ambulatory Visit: Payer: Self-pay

## 2023-09-15 MED ORDER — SILDENAFIL CITRATE 50 MG PO TABS
50.0000 mg | ORAL_TABLET | Freq: Every day | ORAL | 1 refills | Status: DC | PRN
  Filled 2023-09-15: qty 10, 10d supply, fill #0
  Filled 2023-09-25 (×2): qty 10, 10d supply, fill #1

## 2023-09-16 ENCOUNTER — Other Ambulatory Visit: Payer: Self-pay

## 2023-09-22 ENCOUNTER — Other Ambulatory Visit: Payer: Self-pay

## 2023-09-23 ENCOUNTER — Other Ambulatory Visit: Payer: Self-pay

## 2023-09-24 ENCOUNTER — Other Ambulatory Visit: Payer: Self-pay

## 2023-09-24 DIAGNOSIS — G90513 Complex regional pain syndrome I of upper limb, bilateral: Secondary | ICD-10-CM | POA: Diagnosis not present

## 2023-09-24 DIAGNOSIS — G894 Chronic pain syndrome: Secondary | ICD-10-CM | POA: Diagnosis not present

## 2023-09-24 DIAGNOSIS — R202 Paresthesia of skin: Secondary | ICD-10-CM | POA: Diagnosis not present

## 2023-09-24 DIAGNOSIS — Z79891 Long term (current) use of opiate analgesic: Secondary | ICD-10-CM | POA: Diagnosis not present

## 2023-09-25 ENCOUNTER — Other Ambulatory Visit: Payer: Self-pay

## 2023-09-25 ENCOUNTER — Other Ambulatory Visit: Payer: Self-pay | Admitting: Family Medicine

## 2023-09-25 MED ORDER — LANTUS SOLOSTAR 100 UNIT/ML ~~LOC~~ SOPN
10.0000 [IU] | PEN_INJECTOR | Freq: Every day | SUBCUTANEOUS | 2 refills | Status: DC
  Filled 2023-09-25: qty 3, 30d supply, fill #0
  Filled 2023-10-27: qty 3, 30d supply, fill #1

## 2023-09-25 MED ORDER — OXYCODONE HCL 5 MG PO TABS
5.0000 mg | ORAL_TABLET | Freq: Three times a day (TID) | ORAL | 0 refills | Status: DC
  Filled 2023-09-25: qty 90, 30d supply, fill #0

## 2023-09-25 MED ORDER — VALSARTAN 160 MG PO TABS
160.0000 mg | ORAL_TABLET | Freq: Every day | ORAL | 0 refills | Status: DC
  Filled 2023-09-25: qty 90, 90d supply, fill #0

## 2023-09-25 NOTE — Telephone Encounter (Signed)
Requested by interface surescripts. Last seen 3 months ago .  Requested Prescriptions  Pending Prescriptions Disp Refills   valsartan (DIOVAN) 160 MG tablet 90 tablet 0    Sig: Take 1 tablet (160 mg total) by mouth daily.     Cardiovascular:  Angiotensin Receptor Blockers Failed - 09/25/2023  4:16 PM      Failed - Last BP in normal range    BP Readings from Last 1 Encounters:  08/07/23 (!) 173/99         Passed - Cr in normal range and within 180 days    Creatinine, Ser  Date Value Ref Range Status  04/07/2023 1.01 0.61 - 1.24 mg/dL Final         Passed - K in normal range and within 180 days    Potassium  Date Value Ref Range Status  04/07/2023 3.9 3.5 - 5.1 mmol/L Final         Passed - Patient is not pregnant      Passed - Valid encounter within last 6 months    Recent Outpatient Visits           3 months ago Acute cystitis with hematuria   [facility] - A Dept Of [place]. [facility] Storm Frisk, MD   4 months ago Hypertension associated with diabetes [facility])   [facility] - A Dept Of [place]. [facility] Hoy Register, MD   5 months ago Type 2 diabetes mellitus with diabetic polyneuropathy, with long-term current use of insulin (HCC)   [facility] - A Dept Of [place]. [facility] Hoy Register, MD   8 months ago Type 2 diabetes mellitus with diabetic polyneuropathy, with long-term current use of insulin (HCC)   [facility] [place] - A Dept Of [place]. [facility] Hoy Register, MD   11 months ago Essential hypertension   [facility] [place] - A Dept Of [place]. [facility] Lois Huxley, Stephen L, RPH-CPP               insulin glargine (LANTUS SOLOSTAR) 100 UNIT/ML Solostar Pen 3 mL 2    Sig: Inject 10 Units into the skin daily.     Endocrinology:  Diabetes - Insulins Passed - 09/25/2023  4:16 PM      Passed - HBA1C  is between 0 and 7.9 and within 180 days    HbA1c, POC (controlled diabetic range)  Date Value Ref Range Status  06/25/2023 7.9 (A) 0.0 - 7.0 % Final         Passed - Valid encounter within last 6 months    Recent Outpatient Visits           3 months ago Acute cystitis with hematuria   [facility] - A Dept Of [place]. [facility] Storm Frisk, MD   4 months ago Hypertension associated with diabetes [facility])   [facility] - A Dept Of [place]. [facility] Hoy Register, MD   5 months ago Type 2 diabetes mellitus with diabetic polyneuropathy, with long-term current use of insulin (HCC)   [facility] [place] - A Dept Of [place]. [facility] Hoy Register, MD   8 months ago Type 2 diabetes mellitus with diabetic polyneuropathy, with long-term current use of insulin (HCC)   [facility] - A Dept Of [place]. [facility] Hoy Register, MD   11 months ago Essential hypertension   [facility] [place] - A Dept Of . [facility] Drucilla Chalet, RPH-CPP

## 2023-09-28 ENCOUNTER — Other Ambulatory Visit (HOSPITAL_BASED_OUTPATIENT_CLINIC_OR_DEPARTMENT_OTHER): Payer: Self-pay

## 2023-10-05 ENCOUNTER — Other Ambulatory Visit: Payer: Self-pay

## 2023-10-05 ENCOUNTER — Other Ambulatory Visit: Payer: Self-pay | Admitting: Family Medicine

## 2023-10-05 DIAGNOSIS — N528 Other male erectile dysfunction: Secondary | ICD-10-CM

## 2023-10-05 DIAGNOSIS — Z794 Long term (current) use of insulin: Secondary | ICD-10-CM

## 2023-10-06 ENCOUNTER — Other Ambulatory Visit: Payer: Self-pay

## 2023-10-06 MED ORDER — GABAPENTIN 300 MG PO CAPS
300.0000 mg | ORAL_CAPSULE | Freq: Two times a day (BID) | ORAL | 1 refills | Status: DC
  Filled 2023-10-06 – 2023-10-07 (×2): qty 180, 90d supply, fill #0
  Filled 2023-12-28: qty 180, 90d supply, fill #1

## 2023-10-06 MED ORDER — SILDENAFIL CITRATE 50 MG PO TABS
50.0000 mg | ORAL_TABLET | Freq: Every day | ORAL | 1 refills | Status: DC | PRN
  Filled 2023-10-06 – 2023-10-07 (×2): qty 10, 10d supply, fill #0
  Filled 2023-10-20: qty 10, 10d supply, fill #1

## 2023-10-06 NOTE — Telephone Encounter (Signed)
 Requested Prescriptions  Pending Prescriptions Disp Refills   gabapentin (NEURONTIN) 300 MG capsule 180 capsule 1    Sig: Take 1 capsule (300 mg total) by mouth 2 (two) times daily.     Neurology: Anticonvulsants - gabapentin Passed - 10/06/2023 11:54 AM      Passed - Cr in normal range and within 360 days    Creatinine, Ser  Date Value Ref Range Status  04/07/2023 1.01 0.61 - 1.24 mg/dL Final         Passed - Completed PHQ-2 or PHQ-9 in the last 360 days      Passed - Valid encounter within last 12 months    Recent Outpatient Visits           3 months ago Acute cystitis with hematuria   [facility] [place] - A Dept Of [place]. [facility] Storm Frisk, MD   5 months ago Hypertension associated with diabetes [facility])   [facility] - A Dept Of [place]. [facility] Hoy Register, MD   5 months ago Type 2 diabetes mellitus with diabetic polyneuropathy, with long-term current use of insulin (HCC)   [facility] [place] - A Dept Of [place]. [facility] Hoy Register, MD   8 months ago Type 2 diabetes mellitus with diabetic polyneuropathy, with long-term current use of insulin (HCC)   [facility] [place] - A Dept Of [place]. [facility] Hoy Register, MD   12 months ago Essential hypertension   [place] [facility] [place] - A Dept Of [place]. [facility] Lois Huxley, Jeannett Senior L, RPH-CPP               sildenafil (VIAGRA) 50 MG tablet 10 tablet 1    Sig: Take 1 tablet (50 mg total) by mouth daily as needed for erectile dysfunction. At least 24 hours between doses     Urology: Erectile Dysfunction Agents Failed - 10/06/2023 11:54 AM      Failed - AST in normal range and within 360 days    AST  Date Value Ref Range Status  03/24/2023 14 (L) 15 - 41 U/L Final         Failed - Last BP in normal range    BP Readings from Last 1 Encounters:  08/07/23 (!) 173/99          Passed - ALT in normal range and within 360 days    ALT  Date Value Ref Range Status  03/24/2023 14 0 - 44 U/L Final         Passed - Valid encounter within last 12 months    Recent Outpatient Visits           3 months ago Acute cystitis with hematuria   [facility] - [facility] Dept Of [place]. [facility] Storm Frisk, MD   5 months ago Hypertension associated with diabetes [facility])   [facility] - A Dept Of [place]. [facility] Hoy Register, MD   5 months ago Type 2 diabetes mellitus with diabetic polyneuropathy, with long-term current use of insulin (HCC)   [facility] [place] - A Dept Of . [facility] Hoy Register, MD   8 months ago Type 2 diabetes mellitus with diabetic polyneuropathy, with long-term current use of insulin [facility])   [facility] [facility] - A  Dept Of [place]. [facility] Hoy Register, MD   12 months ago Essential hypertension   [place] [facility] [place] - A Dept Of [place]. [facility] Drucilla Chalet, RPH-CPP

## 2023-10-07 ENCOUNTER — Other Ambulatory Visit: Payer: Self-pay

## 2023-10-10 DIAGNOSIS — Z419 Encounter for procedure for purposes other than remedying health state, unspecified: Secondary | ICD-10-CM | POA: Diagnosis not present

## 2023-10-14 ENCOUNTER — Ambulatory Visit: Payer: PRIVATE HEALTH INSURANCE | Admitting: Family Medicine

## 2023-10-20 ENCOUNTER — Other Ambulatory Visit: Payer: Self-pay

## 2023-10-23 ENCOUNTER — Other Ambulatory Visit: Payer: Self-pay

## 2023-10-23 ENCOUNTER — Other Ambulatory Visit: Payer: Self-pay | Admitting: Family Medicine

## 2023-10-23 DIAGNOSIS — N528 Other male erectile dysfunction: Secondary | ICD-10-CM

## 2023-10-23 MED ORDER — SILDENAFIL CITRATE 50 MG PO TABS
50.0000 mg | ORAL_TABLET | Freq: Every day | ORAL | 0 refills | Status: DC | PRN
  Filled 2023-10-23 – 2023-10-26 (×2): qty 10, 10d supply, fill #0

## 2023-10-26 ENCOUNTER — Other Ambulatory Visit: Payer: Self-pay

## 2023-10-27 ENCOUNTER — Other Ambulatory Visit: Payer: Self-pay

## 2023-11-03 DIAGNOSIS — R202 Paresthesia of skin: Secondary | ICD-10-CM | POA: Diagnosis not present

## 2023-11-03 DIAGNOSIS — Z79891 Long term (current) use of opiate analgesic: Secondary | ICD-10-CM | POA: Diagnosis not present

## 2023-11-03 DIAGNOSIS — G894 Chronic pain syndrome: Secondary | ICD-10-CM | POA: Diagnosis not present

## 2023-11-03 DIAGNOSIS — G90513 Complex regional pain syndrome I of upper limb, bilateral: Secondary | ICD-10-CM | POA: Diagnosis not present

## 2023-11-03 DIAGNOSIS — Q820 Hereditary lymphedema: Secondary | ICD-10-CM | POA: Diagnosis not present

## 2023-11-04 ENCOUNTER — Other Ambulatory Visit: Payer: Self-pay | Admitting: Family Medicine

## 2023-11-04 ENCOUNTER — Other Ambulatory Visit: Payer: Self-pay

## 2023-11-04 DIAGNOSIS — N528 Other male erectile dysfunction: Secondary | ICD-10-CM

## 2023-11-05 ENCOUNTER — Other Ambulatory Visit: Payer: Self-pay

## 2023-11-06 ENCOUNTER — Other Ambulatory Visit: Payer: Self-pay

## 2023-11-10 ENCOUNTER — Other Ambulatory Visit: Payer: Self-pay

## 2023-11-10 ENCOUNTER — Emergency Department (HOSPITAL_COMMUNITY)

## 2023-11-10 ENCOUNTER — Encounter (HOSPITAL_COMMUNITY): Payer: Self-pay | Admitting: Emergency Medicine

## 2023-11-10 ENCOUNTER — Observation Stay (HOSPITAL_COMMUNITY)

## 2023-11-10 ENCOUNTER — Inpatient Hospital Stay (HOSPITAL_COMMUNITY)
Admission: EM | Admit: 2023-11-10 | Discharge: 2023-11-17 | DRG: 871 | Disposition: A | Discharge status: Home / Self Care | Attending: Internal Medicine | Admitting: Internal Medicine

## 2023-11-10 DIAGNOSIS — A419 Sepsis, unspecified organism: Principal | ICD-10-CM | POA: Diagnosis present

## 2023-11-10 DIAGNOSIS — E1110 Type 2 diabetes mellitus with ketoacidosis without coma: Principal | ICD-10-CM

## 2023-11-10 DIAGNOSIS — N10 Acute tubulo-interstitial nephritis: Secondary | ICD-10-CM | POA: Diagnosis present

## 2023-11-10 DIAGNOSIS — R110 Nausea: Secondary | ICD-10-CM | POA: Diagnosis not present

## 2023-11-10 DIAGNOSIS — R739 Hyperglycemia, unspecified: Secondary | ICD-10-CM | POA: Diagnosis not present

## 2023-11-10 DIAGNOSIS — Z794 Long term (current) use of insulin: Secondary | ICD-10-CM

## 2023-11-10 DIAGNOSIS — D649 Anemia, unspecified: Secondary | ICD-10-CM

## 2023-11-10 DIAGNOSIS — R9389 Abnormal findings on diagnostic imaging of other specified body structures: Secondary | ICD-10-CM | POA: Diagnosis not present

## 2023-11-10 DIAGNOSIS — I4719 Other supraventricular tachycardia: Secondary | ICD-10-CM | POA: Diagnosis present

## 2023-11-10 DIAGNOSIS — E785 Hyperlipidemia, unspecified: Secondary | ICD-10-CM | POA: Diagnosis not present

## 2023-11-10 DIAGNOSIS — Z5986 Financial insecurity: Secondary | ICD-10-CM

## 2023-11-10 DIAGNOSIS — R000 Tachycardia, unspecified: Secondary | ICD-10-CM | POA: Diagnosis not present

## 2023-11-10 DIAGNOSIS — Z555 Less than a high school diploma: Secondary | ICD-10-CM

## 2023-11-10 DIAGNOSIS — E11621 Type 2 diabetes mellitus with foot ulcer: Secondary | ICD-10-CM | POA: Diagnosis present

## 2023-11-10 DIAGNOSIS — E860 Dehydration: Secondary | ICD-10-CM | POA: Diagnosis present

## 2023-11-10 DIAGNOSIS — E872 Acidosis, unspecified: Secondary | ICD-10-CM | POA: Diagnosis present

## 2023-11-10 DIAGNOSIS — Z5941 Food insecurity: Secondary | ICD-10-CM

## 2023-11-10 DIAGNOSIS — I480 Paroxysmal atrial fibrillation: Secondary | ICD-10-CM | POA: Diagnosis present

## 2023-11-10 DIAGNOSIS — I890 Lymphedema, not elsewhere classified: Secondary | ICD-10-CM | POA: Diagnosis not present

## 2023-11-10 DIAGNOSIS — L97521 Non-pressure chronic ulcer of other part of left foot limited to breakdown of skin: Secondary | ICD-10-CM

## 2023-11-10 DIAGNOSIS — I110 Hypertensive heart disease with heart failure: Secondary | ICD-10-CM | POA: Diagnosis present

## 2023-11-10 DIAGNOSIS — E11610 Type 2 diabetes mellitus with diabetic neuropathic arthropathy: Secondary | ICD-10-CM | POA: Diagnosis present

## 2023-11-10 DIAGNOSIS — I2489 Other forms of acute ischemic heart disease: Secondary | ICD-10-CM | POA: Diagnosis present

## 2023-11-10 DIAGNOSIS — R0602 Shortness of breath: Secondary | ICD-10-CM | POA: Diagnosis not present

## 2023-11-10 DIAGNOSIS — Z992 Dependence on renal dialysis: Secondary | ICD-10-CM

## 2023-11-10 DIAGNOSIS — R109 Unspecified abdominal pain: Secondary | ICD-10-CM | POA: Diagnosis not present

## 2023-11-10 DIAGNOSIS — N179 Acute kidney failure, unspecified: Secondary | ICD-10-CM | POA: Diagnosis not present

## 2023-11-10 DIAGNOSIS — Z89431 Acquired absence of right foot: Secondary | ICD-10-CM

## 2023-11-10 DIAGNOSIS — I5032 Chronic diastolic (congestive) heart failure: Secondary | ICD-10-CM | POA: Diagnosis present

## 2023-11-10 DIAGNOSIS — K5900 Constipation, unspecified: Secondary | ICD-10-CM | POA: Diagnosis not present

## 2023-11-10 DIAGNOSIS — E278 Other specified disorders of adrenal gland: Secondary | ICD-10-CM | POA: Diagnosis present

## 2023-11-10 DIAGNOSIS — I482 Chronic atrial fibrillation, unspecified: Secondary | ICD-10-CM

## 2023-11-10 DIAGNOSIS — K3184 Gastroparesis: Secondary | ICD-10-CM | POA: Diagnosis not present

## 2023-11-10 DIAGNOSIS — M542 Cervicalgia: Secondary | ICD-10-CM

## 2023-11-10 DIAGNOSIS — D6489 Other specified anemias: Secondary | ICD-10-CM | POA: Diagnosis present

## 2023-11-10 DIAGNOSIS — K219 Gastro-esophageal reflux disease without esophagitis: Secondary | ICD-10-CM | POA: Diagnosis present

## 2023-11-10 DIAGNOSIS — R7989 Other specified abnormal findings of blood chemistry: Secondary | ICD-10-CM | POA: Diagnosis not present

## 2023-11-10 DIAGNOSIS — Z800 Family history of malignant neoplasm of digestive organs: Secondary | ICD-10-CM

## 2023-11-10 DIAGNOSIS — Z7982 Long term (current) use of aspirin: Secondary | ICD-10-CM

## 2023-11-10 DIAGNOSIS — Z1152 Encounter for screening for COVID-19: Secondary | ICD-10-CM

## 2023-11-10 DIAGNOSIS — E039 Hypothyroidism, unspecified: Secondary | ICD-10-CM | POA: Diagnosis present

## 2023-11-10 DIAGNOSIS — M14672 Charcot's joint, left ankle and foot: Secondary | ICD-10-CM

## 2023-11-10 DIAGNOSIS — L97429 Non-pressure chronic ulcer of left heel and midfoot with unspecified severity: Secondary | ICD-10-CM | POA: Diagnosis present

## 2023-11-10 DIAGNOSIS — E876 Hypokalemia: Secondary | ICD-10-CM | POA: Diagnosis present

## 2023-11-10 DIAGNOSIS — E1143 Type 2 diabetes mellitus with diabetic autonomic (poly)neuropathy: Secondary | ICD-10-CM | POA: Diagnosis present

## 2023-11-10 DIAGNOSIS — M79652 Pain in left thigh: Secondary | ICD-10-CM

## 2023-11-10 DIAGNOSIS — Z79899 Other long term (current) drug therapy: Secondary | ICD-10-CM

## 2023-11-10 DIAGNOSIS — Z833 Family history of diabetes mellitus: Secondary | ICD-10-CM

## 2023-11-10 DIAGNOSIS — I10 Essential (primary) hypertension: Secondary | ICD-10-CM | POA: Diagnosis present

## 2023-11-10 DIAGNOSIS — R0689 Other abnormalities of breathing: Secondary | ICD-10-CM | POA: Diagnosis not present

## 2023-11-10 DIAGNOSIS — Z6841 Body Mass Index (BMI) 40.0 and over, adult: Secondary | ICD-10-CM

## 2023-11-10 LAB — COMPREHENSIVE METABOLIC PANEL WITH GFR
ALT: 12 U/L (ref 0–44)
AST: 15 U/L (ref 15–41)
Albumin: 2 g/dL — ABNORMAL LOW (ref 3.5–5.0)
Alkaline Phosphatase: 102 U/L (ref 38–126)
Anion gap: 20 — ABNORMAL HIGH (ref 5–15)
BUN: 35 mg/dL — ABNORMAL HIGH (ref 6–20)
CO2: 10 mmol/L — ABNORMAL LOW (ref 22–32)
Calcium: 8.5 mg/dL — ABNORMAL LOW (ref 8.9–10.3)
Chloride: 107 mmol/L (ref 98–111)
Creatinine, Ser: 2.09 mg/dL — ABNORMAL HIGH (ref 0.61–1.24)
GFR, Estimated: 38 mL/min — ABNORMAL LOW (ref >60)
Glucose, Bld: 518 mg/dL (ref 70–99)
Potassium: 3.7 mmol/L (ref 3.5–5.1)
Sodium: 137 mmol/L (ref 135–145)
Total Bilirubin: 1.6 mg/dL — ABNORMAL HIGH (ref 0.0–1.2)
Total Protein: 7 g/dL (ref 6.5–8.1)

## 2023-11-10 LAB — URINALYSIS, ROUTINE W REFLEX MICROSCOPIC
Bacteria, UA: NONE SEEN
Bilirubin Urine: NEGATIVE
Glucose, UA: >=500 mg/dL — AB
Ketones, ur: 80 mg/dL — AB
Leukocytes,Ua: NEGATIVE
Nitrite: NEGATIVE
Protein, ur: 100 mg/dL — AB
Specific Gravity, Urine: 1.018 (ref 1.005–1.030)
pH: 5 (ref 5.0–8.0)

## 2023-11-10 LAB — I-STAT CHEM 8, ED
BUN: 37 mg/dL — ABNORMAL HIGH (ref 6–20)
Calcium, Ion: 1 mmol/L — ABNORMAL LOW (ref 1.15–1.40)
Chloride: 108 mmol/L (ref 98–111)
Creatinine, Ser: 1.5 mg/dL — ABNORMAL HIGH (ref 0.61–1.24)
Glucose, Bld: 645 mg/dL (ref 70–99)
HCT: 41 % (ref 39.0–52.0)
Hemoglobin: 13.9 g/dL (ref 13.0–17.0)
Potassium: 5 mmol/L (ref 3.5–5.1)
Sodium: 132 mmol/L — ABNORMAL LOW (ref 135–145)
TCO2: 8 mmol/L — ABNORMAL LOW (ref 22–32)

## 2023-11-10 LAB — I-STAT VENOUS BLOOD GAS, ED
Acid-base deficit: 20 mmol/L — ABNORMAL HIGH (ref 0.0–2.0)
Bicarbonate: 6.9 mmol/L — ABNORMAL LOW (ref 20.0–28.0)
Calcium, Ion: 1.13 mmol/L — ABNORMAL LOW (ref 1.15–1.40)
HCT: 38 % — ABNORMAL LOW (ref 39.0–52.0)
Hemoglobin: 12.9 g/dL — ABNORMAL LOW (ref 13.0–17.0)
O2 Saturation: 79 %
Potassium: 4.6 mmol/L (ref 3.5–5.1)
Sodium: 134 mmol/L — ABNORMAL LOW (ref 135–145)
TCO2: 7 mmol/L — ABNORMAL LOW (ref 22–32)
pCO2, Ven: 19 mmHg — CL (ref 44–60)
pH, Ven: 7.168 — CL (ref 7.25–7.43)
pO2, Ven: 53 mmHg — ABNORMAL HIGH (ref 32–45)

## 2023-11-10 LAB — CBC WITH DIFFERENTIAL/PLATELET
Abs Immature Granulocytes: 0.07 10*3/uL (ref 0.00–0.07)
Basophils Absolute: 0 10*3/uL (ref 0.0–0.1)
Basophils Relative: 0 %
Eosinophils Absolute: 0 10*3/uL (ref 0.0–0.5)
Eosinophils Relative: 0 %
HCT: 35.6 % — ABNORMAL LOW (ref 39.0–52.0)
Hemoglobin: 11.3 g/dL — ABNORMAL LOW (ref 13.0–17.0)
Immature Granulocytes: 0 %
Lymphocytes Relative: 4 %
Lymphs Abs: 0.7 10*3/uL (ref 0.7–4.0)
MCH: 27.4 pg (ref 26.0–34.0)
MCHC: 31.7 g/dL (ref 30.0–36.0)
MCV: 86.4 fL (ref 80.0–100.0)
Monocytes Absolute: 0.7 10*3/uL (ref 0.1–1.0)
Monocytes Relative: 4 %
Neutro Abs: 17.4 10*3/uL — ABNORMAL HIGH (ref 1.7–7.7)
Neutrophils Relative %: 92 %
Platelets: 314 10*3/uL (ref 150–400)
RBC: 4.12 MIL/uL — ABNORMAL LOW (ref 4.22–5.81)
RDW: 18.4 % — ABNORMAL HIGH (ref 11.5–15.5)
WBC: 18.9 10*3/uL — ABNORMAL HIGH (ref 4.0–10.5)
nRBC: 0 % (ref 0.0–0.2)

## 2023-11-10 LAB — BASIC METABOLIC PANEL WITH GFR
Anion gap: 16 — ABNORMAL HIGH (ref 5–15)
BUN: 37 mg/dL — ABNORMAL HIGH (ref 6–20)
CO2: 15 mmol/L — ABNORMAL LOW (ref 22–32)
Calcium: 8.6 mg/dL — ABNORMAL LOW (ref 8.9–10.3)
Chloride: 105 mmol/L (ref 98–111)
Creatinine, Ser: 2.02 mg/dL — ABNORMAL HIGH (ref 0.61–1.24)
GFR, Estimated: 39 mL/min — ABNORMAL LOW (ref >60)
Glucose, Bld: 444 mg/dL — ABNORMAL HIGH (ref 70–99)
Potassium: 3.6 mmol/L (ref 3.5–5.1)
Sodium: 136 mmol/L (ref 135–145)

## 2023-11-10 LAB — CBG MONITORING, ED
Glucose-Capillary: 280 mg/dL — ABNORMAL HIGH (ref 70–99)
Glucose-Capillary: 345 mg/dL — ABNORMAL HIGH (ref 70–99)
Glucose-Capillary: 377 mg/dL — ABNORMAL HIGH (ref 70–99)
Glucose-Capillary: 419 mg/dL — ABNORMAL HIGH (ref 70–99)
Glucose-Capillary: 430 mg/dL — ABNORMAL HIGH (ref 70–99)
Glucose-Capillary: 442 mg/dL — ABNORMAL HIGH (ref 70–99)
Glucose-Capillary: 459 mg/dL — ABNORMAL HIGH (ref 70–99)
Glucose-Capillary: 490 mg/dL — ABNORMAL HIGH (ref 70–99)
Glucose-Capillary: 518 mg/dL (ref 70–99)
Glucose-Capillary: 562 mg/dL (ref 70–99)
Glucose-Capillary: 570 mg/dL (ref 70–99)
Glucose-Capillary: 598 mg/dL (ref 70–99)

## 2023-11-10 LAB — RESP PANEL BY RT-PCR (RSV, FLU A&B, COVID)  RVPGX2
Influenza A by PCR: NEGATIVE
Influenza B by PCR: NEGATIVE
Resp Syncytial Virus by PCR: NEGATIVE
SARS Coronavirus 2 by RT PCR: NEGATIVE

## 2023-11-10 LAB — TROPONIN I (HIGH SENSITIVITY)
Troponin I (High Sensitivity): 23 ng/L — ABNORMAL HIGH (ref <18)
Troponin I (High Sensitivity): 22 ng/L — ABNORMAL HIGH (ref <18)

## 2023-11-10 LAB — BRAIN NATRIURETIC PEPTIDE: B Natriuretic Peptide: 585.6 pg/mL — ABNORMAL HIGH (ref 0.0–100.0)

## 2023-11-10 LAB — BETA-HYDROXYBUTYRIC ACID: Beta-Hydroxybutyric Acid: 5.65 mmol/L — ABNORMAL HIGH (ref 0.05–0.27)

## 2023-11-10 LAB — CK: Total CK: 88 U/L (ref 49–397)

## 2023-11-10 LAB — LACTIC ACID, PLASMA: Lactic Acid, Venous: 3.7 mmol/L (ref 0.5–1.9)

## 2023-11-10 MED ORDER — POTASSIUM CHLORIDE 10 MEQ/100ML IV SOLN
10.0000 meq | Freq: Once | INTRAVENOUS | Status: AC
  Administered 2023-11-10: 10 meq via INTRAVENOUS
  Filled 2023-11-10: qty 100

## 2023-11-10 MED ORDER — METOPROLOL TARTRATE 5 MG/5ML IV SOLN
2.5000 mg | Freq: Once | INTRAVENOUS | Status: AC
  Administered 2023-11-10: 2.5 mg via INTRAVENOUS
  Filled 2023-11-10: qty 5

## 2023-11-10 MED ORDER — LACTATED RINGERS IV SOLN: INTRAVENOUS | Status: DC

## 2023-11-10 MED ORDER — DEXTROSE IN LACTATED RINGERS 5 % IV SOLN: INTRAVENOUS | Status: DC

## 2023-11-10 MED ORDER — FENTANYL CITRATE PF 50 MCG/ML IJ SOSY
50.0000 ug | PREFILLED_SYRINGE | Freq: Once | INTRAMUSCULAR | Status: AC
  Administered 2023-11-10: 50 ug via INTRAVENOUS
  Filled 2023-11-10: qty 1

## 2023-11-10 MED ORDER — LACTATED RINGERS IV BOLUS
20.0000 mL/kg | Freq: Once | INTRAVENOUS | Status: AC
  Administered 2023-11-10: 2848 mL via INTRAVENOUS

## 2023-11-10 MED ORDER — DEXTROSE 50 % IV SOLN: 0.0000 mL | INTRAVENOUS | Status: DC | PRN

## 2023-11-10 MED ORDER — SODIUM CHLORIDE 0.9 % IV SOLN: INTRAVENOUS | Status: DC

## 2023-11-10 MED ORDER — LACTATED RINGERS IV BOLUS
1000.0000 mL | Freq: Once | INTRAVENOUS | Status: AC
  Administered 2023-11-10: 1000 mL via INTRAVENOUS

## 2023-11-10 MED ORDER — POTASSIUM CHLORIDE 10 MEQ/100ML IV SOLN
10.0000 meq | INTRAVENOUS | Status: AC
  Administered 2023-11-10 – 2023-11-11 (×2): 10 meq via INTRAVENOUS
  Filled 2023-11-10 (×2): qty 100

## 2023-11-10 MED ORDER — METOPROLOL TARTRATE 25 MG PO TABS: 25.0000 mg | ORAL_TABLET | Freq: Two times a day (BID) | ORAL | Status: DC

## 2023-11-10 MED ORDER — POTASSIUM CHLORIDE 10 MEQ/100ML IV SOLN
10.0000 meq | INTRAVENOUS | Status: AC
  Administered 2023-11-10: 10 meq via INTRAVENOUS
  Filled 2023-11-10: qty 100

## 2023-11-10 MED ORDER — INSULIN REGULAR(HUMAN) IN NACL 100-0.9 UT/100ML-% IV SOLN
INTRAVENOUS | Status: DC
  Administered 2023-11-10: 15 [IU]/h via INTRAVENOUS
  Filled 2023-11-10 (×3): qty 100

## 2023-11-10 NOTE — ED Notes (Signed)
 Per PA lab states that labs are not in lab.

## 2023-11-10 NOTE — ED Notes (Signed)
 Notified admitting provider of the amount of insulin patient is on. Per MD hold on increasing the dose until labs are drawn again.

## 2023-11-10 NOTE — ED Notes (Signed)
 Patient returned from CT

## 2023-11-10 NOTE — Assessment & Plan Note (Signed)
 Check lipid panel

## 2023-11-10 NOTE — Assessment & Plan Note (Signed)
 Reglan PRN

## 2023-11-10 NOTE — Assessment & Plan Note (Signed)
 Elevated in the setting of DKA Denies any chest pain Appears to be stable continue to monitor echo in a.m.

## 2023-11-10 NOTE — Assessment & Plan Note (Addendum)
 Not on anticoagulation Resume metoprolol when blood pressure allows

## 2023-11-10 NOTE — ED Provider Notes (Signed)
 [place] EMERGENCY DEPARTMENT AT [facility] Provider Note   CSN: [id number] Arrival date & time: 11/10/23  1500     History Chief Complaint  Patient presents with   Blood Sugar Problem    Taylor Hughes is a 53 y.o. male with history of type 2 diabetes, hypertension, reports no other health problems presents emerged from today for evaluation of nausea and vomiting for the past 3 days.  Denies any belly pain or changes with his bowels.  Reports he has been more thirsty and also feels like he has been urinating more.  Has not been using his insulin for the past 3 days because of this.  He reports that his sugars are usually in the 200s occasionally will be in the 300s.  He is reporting some chills after vomiting but no fever.  He is unable to quantify the amount of vomiting however denies any blood or coffee-ground emesis.  Denies any chest pain, shortness of breath or any cough or cold symptoms.  Reports he started having some pain either yesterday or the day before and his anterior left thigh, bilateral lower back, as well as the right side of his neck.  No medications taken for this.  Denies any headache or any numbness or tingling.  He has chronic lymphedema in his lower legs which patient reports are at his baseline.  He reports that he felt her sugar was elevated but has not been checking it at home.  He denies any tobacco, EtOH, licit drug use.  HPI     Home Medications Prior to Admission medications   Medication Sig Start Date End Date Taking? Authorizing Provider  Accu-Chek Softclix Lancets lancets Use to check blood sugar 3 times daily. 06/02/23   Hoy Register, MD  acetaminophen-codeine (TYLENOL #3) 300-30 MG tablet Take 1 tablet by mouth every 4 (four) hours as needed for pain. Patient not taking: Reported on 08/07/2023 07/13/23     albuterol (VENTOLIN HFA) 108 (90 Base) MCG/ACT inhaler Inhale 2 puffs into the lungs every 6 (six) hours as needed for wheezing or  shortness of breath. Patient not taking: Reported on 07/24/2023 10/07/22   Hoy Register, MD  amoxicillin (AMOXIL) 500 MG capsule Take 1 capsule (500 mg total) by mouth 3 (three) times daily. 06/30/23     aspirin EC 81 MG tablet Take 1 tablet (81 mg total) by mouth daily. Swallow whole. Patient not taking: Reported on 08/07/2023 04/07/23   Elgergawy, Leana Roe, MD  atorvastatin (LIPITOR) 20 MG tablet Take 1 tablet (20 mg total) by mouth daily. 01/19/23   Hoy Register, MD  Blood Glucose Monitoring Suppl (ACCU-CHEK GUIDE) w/Device KIT Use to check blood sugar 3 times daily. 06/02/23   Hoy Register, MD  buPROPion (WELLBUTRIN XL) 150 MG 24 hr tablet Take 1 tablet (150 mg total) by mouth daily. Patient not taking: Reported on 08/07/2023 04/07/23   Elgergawy, Leana Roe, MD  chlorhexidine (PERIDEX) 0.12 % solution Swish 1 capful by mouth for 30 seconds, then spit three times daily. Patient not taking: Reported on 08/07/2023 07/13/23     clindamycin (CLEOCIN) 300 MG capsule Take 1 capsule (300 mg total) by mouth 3 (three) times daily with glass of water. 07/13/23     folic acid (FOLVITE) 1 MG tablet Take 1 tablet (1 mg total) by mouth daily. 04/08/23   Elgergawy, Leana Roe, MD  gabapentin (NEURONTIN) 300 MG capsule Take 1 capsule (300 mg total) by mouth 2 (two) times daily. 10/06/23  Hoy Register, MD  glucose blood (ACCU-CHEK GUIDE TEST) test strip use as directed 3 (three) times daily. 06/02/23   Hoy Register, MD  glucose blood (ACCU-CHEK GUIDE) test strip Use to check blood sugar 3 times daily. 06/02/23   Hoy Register, MD  hydrocortisone cream 0.5 % Apply 1 Application topically 2 (two) times daily. Patient not taking: Reported on 08/07/2023 05/07/23   Hoy Register, MD  insulin glargine (LANTUS SOLOSTAR) 100 UNIT/ML Solostar Pen Inject 10 Units into the skin daily. 09/25/23   Hoy Register, MD  insulin lispro (HUMALOG KWIKPEN) 100 UNIT/ML KwikPen Inject 0-12 units into the skin three times  daily with meals. Per sliding scale. 07/22/23   Hoy Register, MD  Insulin Pen Needle 32G X 6 MM MISC USE AS DIRECTED 11/17/22   Hoy Register, MD  Lactobacillus (ACIDOPHILUS) CAPS capsule Take 2 capsules by mouth daily. 04/08/23   Elgergawy, Leana Roe, MD  methylPREDNISolone (MEDROL) 4 MG TBPK tablet TAKE AS DIRECTED ON PACKAGE. 09/03/23     metoprolol tartrate (LOPRESSOR) 25 MG tablet Take 1 tablet (25 mg total) by mouth 2 (two) times daily. 05/14/23   Hoy Register, MD  omeprazole (PRILOSEC) 40 MG capsule Take 1 capsule (40 mg total) by mouth daily. 01/19/23   Hoy Register, MD  oxyCODONE (OXY IR/ROXICODONE) 5 MG immediate release tablet Take 1 tablet (5 mg total) by mouth every 12 (twelve) hours as needed for moderate pain (pain score 4-6) or severe pain (pain score 7-10). Patient not taking: Reported on 08/07/2023 07/15/23   Adonis Huguenin, NP  oxyCODONE (OXY IR/ROXICODONE) 5 MG immediate release tablet Take 1 tablet (5 mg total) by mouth every 8 (eight) hours. 09/24/23     sildenafil (VIAGRA) 50 MG tablet Take 1 tablet (50 mg total) by mouth daily as needed for erectile dysfunction. At least 24 hours between doses 10/23/23   Hoy Register, MD  valsartan (DIOVAN) 160 MG tablet Take 1 tablet (160 mg total) by mouth daily. 09/25/23   Hoy Register, MD  furosemide (LASIX) 20 MG tablet Take 2 tablets (40 mg total) by mouth daily. Patient not taking: Reported on 08/07/2023 05/14/23 11/04/23  Hoy Register, MD      Allergies    Patient has no known allergies.    Review of Systems   Review of Systems  Constitutional:  Negative for chills and fever.  Respiratory:  Negative for shortness of breath.   Cardiovascular:  Negative for chest pain.  Gastrointestinal:  Positive for nausea and vomiting. Negative for abdominal pain, blood in stool, constipation and diarrhea.  Genitourinary:  Negative for dysuria and hematuria.  Musculoskeletal:  Positive for myalgias. Negative for neck stiffness.   Neurological:  Negative for headaches.    Physical Exam Updated Vital Signs BP 128/75   Pulse (!) 105   Temp 99 F (37.2 C)   Resp 13   Ht 5\' 11"  (1.803 m)   Wt (!) 142.4 kg   SpO2 100%   BMI 43.79 kg/m  Physical Exam Vitals and nursing note reviewed.  Constitutional:      Appearance: He is obese. He is not toxic-appearing.     Comments: Uncomfortable, but nontoxic-appearing  HENT:     Mouth/Throat:     Mouth: Mucous membranes are dry.  Eyes:     General: No scleral icterus. Neck:     Comments: Normal range of motion of the neck.  No nuchal rigidity.  Does have some tenderness to the right occiput.  No overlying skin changes  noted. Cardiovascular:     Rate and Rhythm: Tachycardia present.  Pulmonary:     Effort: No respiratory distress.     Comments: Patient does have some tachypnea but no respiratory distress.  Still speaking in full sentences Abdominal:     Palpations: Abdomen is soft.     Tenderness: There is no abdominal tenderness. There is no guarding or rebound.  Musculoskeletal:     Right lower leg: Edema present.     Left lower leg: Edema present.     Comments: Chronic edema seen to lower extremities.  Compartments firm but pliable  Skin:    General: Skin is warm and dry.  Neurological:     Mental Status: He is alert.     Comments: Moving all extremities.  Alert and oriented.     ED Results / Procedures / Treatments   Labs (all labs ordered are listed, but only abnormal results are displayed) Labs Reviewed  CBC WITH DIFFERENTIAL/PLATELET - Abnormal; Notable for the following components:      Result Value   WBC 18.9 (*)    RBC 4.12 (*)    Hemoglobin 11.3 (*)    HCT 35.6 (*)    RDW 18.4 (*)    Neutro Abs 17.4 (*)    All other components within normal limits  COMPREHENSIVE METABOLIC PANEL WITH GFR - Abnormal; Notable for the following components:   CO2 10 (*)    Glucose, Bld 518 (*)    BUN 35 (*)    Creatinine, Ser 2.09 (*)    Calcium 8.5 (*)     Albumin 2.0 (*)    Total Bilirubin 1.6 (*)    GFR, Estimated 38 (*)    Anion gap 20 (*)    All other components within normal limits  URINALYSIS, ROUTINE W REFLEX MICROSCOPIC - Abnormal; Notable for the following components:   Glucose, UA >=500 (*)    Hgb urine dipstick MODERATE (*)    Ketones, ur 80 (*)    Protein, ur 100 (*)    All other components within normal limits  CBG MONITORING, ED - Abnormal; Notable for the following components:   Glucose-Capillary 562 (*)    All other components within normal limits  I-STAT VENOUS BLOOD GAS, ED - Abnormal; Notable for the following components:   pH, Ven 7.168 (*)    pCO2, Ven 19.0 (*)    pO2, Ven 53 (*)    Bicarbonate 6.9 (*)    TCO2 7 (*)    Acid-base deficit 20.0 (*)    Sodium 134 (*)    Calcium, Ion 1.13 (*)    HCT 38.0 (*)    Hemoglobin 12.9 (*)    All other components within normal limits  I-STAT CHEM 8, ED - Abnormal; Notable for the following components:   Sodium 132 (*)    BUN 37 (*)    Creatinine, Ser 1.50 (*)    Glucose, Bld 645 (*)    Calcium, Ion 1.00 (*)    TCO2 8 (*)    All other components within normal limits  CBG MONITORING, ED - Abnormal; Notable for the following components:   Glucose-Capillary 598 (*)    All other components within normal limits  CBG MONITORING, ED - Abnormal; Notable for the following components:   Glucose-Capillary 570 (*)    All other components within normal limits  CBG MONITORING, ED - Abnormal; Notable for the following components:   Glucose-Capillary 518 (*)    All other components within normal  limits  CBG MONITORING, ED - Abnormal; Notable for the following components:   Glucose-Capillary 490 (*)    All other components within normal limits  CBG MONITORING, ED - Abnormal; Notable for the following components:   Glucose-Capillary 459 (*)    All other components within normal limits  CBG MONITORING, ED - Abnormal; Notable for the following components:   Glucose-Capillary 430  (*)    All other components within normal limits  TROPONIN I (HIGH SENSITIVITY) - Abnormal; Notable for the following components:   Troponin I (High Sensitivity) 23 (*)    All other components within normal limits  RESP PANEL BY RT-PCR (RSV, FLU A&B, COVID)  RVPGX2  CK  BETA-HYDROXYBUTYRIC ACID  BRAIN NATRIURETIC PEPTIDE  TROPONIN I (HIGH SENSITIVITY)    EKG EKG Interpretation Date/Time:  Tuesday November 10 2023 15:20:45 EDT Ventricular Rate:  145 PR Interval:  132 QRS Duration:  76 QT Interval:  274 QTC Calculation: 426 R Axis:   39  Text Interpretation: Sinus or ectopic atrial tachycardia Borderline T abnormalities, diffuse leads No significant change since last tracing Confirmed by Gwyneth Sprout ([id number]) on 11/10/2023 3:42:32 PM  Radiology DG Chest Portable 1 View Result Date: 11/10/2023 CLINICAL DATA:  DKA.  Shortness of breath. EXAM: PORTABLE CHEST 1 VIEW COMPARISON:  03/25/2023. FINDINGS: Bilateral lung fields are clear. Bilateral costophrenic angles are clear. Note is made of elevated right hemidiaphragm. Stable cardio-mediastinal silhouette. No acute osseous abnormalities. The soft tissues are within normal limits. IMPRESSION: *No active disease. Electronically Signed   By: Jules Schick M.D.   On: 11/10/2023 17:34   Procedures .Critical Care  Performed by: Achille Rich, PA-C Authorized by: Achille Rich, PA-C   Critical care provider statement:    Critical care time (minutes):  60   Critical care was necessary to treat or prevent imminent or life-threatening deterioration of the following conditions:  Metabolic crisis (DKA)   Critical care was time spent personally by me on the following activities:  Development of treatment plan with patient or surrogate, discussions with consultants, evaluation of patient's response to treatment, examination of patient, ordering and review of laboratory studies, ordering and review of radiographic studies, ordering and performing  treatments and interventions, pulse oximetry, re-evaluation of patient's condition, review of old charts and obtaining history from patient or surrogate   Care discussed with: admitting provider      Medications Ordered in ED Medications  insulin regular, human (MYXREDLIN) 100 units/ 100 mL infusion (15 Units/hr Intravenous New Bag/Given 11/10/23 1645)  lactated ringers infusion ( Intravenous New Bag/Given 11/10/23 1757)  dextrose 5 % in lactated ringers infusion (has no administration in time range)  dextrose 50 % solution 0-50 mL (has no administration in time range)  potassium chloride 10 mEq in 100 mL IVPB (0 mEq Intravenous Stopped 11/10/23 2008)  lactated ringers bolus 1,000 mL (0 mLs Intravenous Stopped 11/10/23 1731)  lactated ringers bolus 2,848 mL (2,848 mLs Intravenous New Bag/Given 11/10/23 1700)  fentaNYL (SUBLIMAZE) injection 50 mcg (50 mcg Intravenous Given 11/10/23 2003)    ED Course/ Medical Decision Making/ A&P Clinical Course as of 11/10/23 2021  Tue Nov 10, 2023  1835 Following up with lab given that the lab work does not say in process. Nursing reports they think blood was sent down. They are unable to get blood sticks further. The patient currently has one IV with fluids and insulin  [RR]  1836 Lab was not called for follow up. Doing this now.  [RR]  Clinical Course User Index [RR] Achille Rich, PA-C   Medical Decision Making Amount and/or Complexity of Data Reviewed Labs: ordered. Radiology: ordered.  Risk Prescription drug management. Decision regarding hospitalization.   53 y.o. male presents to the ER for evaluation of vomiting and elevated blood sugar. Differential diagnosis includes but is not limited to ACS/MI, Boerhaave's, DKA, elevated ICP, Ischemic bowel, Sepsis, drug-related, Appendicitis, Bowel obstruction, Electrolyte abnormalities, Pancreatitis, Biliary colic, Gastroenteritis, Gastroparesis, Hepatitis, Migraine, Thyroid disease, Renal colic, PUD, UTI.  Vital signs significant tachycardia in the 150s. Appears Normal Sinus. Physical exam as noted above.   Point-of-care CBG simply elevated.  I-STAT Chem-8 shows potassium of 5 with again elevated glucose.  Given the small ketones in the room, is likely DKA.  Will get the patient started on insulin and fluids per Endo tool.    I have ordered the patient fluids given his dry mucous membranes as well as history of vomiting.  I independently reviewed and interpreted the patient's labs.  Urinalysis shows greater than 5 and glucose with moderate hemoglobin present.  80 he has not had a protein.  No signs of infection.  Rester panel negative for COVID, flu, RSV.  I-STAT Chem-8 shows sodium 132 but glucose of 645.  Creatinine 1.5 with a BUN of 37.  Bicarb at 8 on i-STAT.  I-STAT VBG shows pH is 7.1 with a pCO2 of 19.  CBC probably resulted with a white blood cell count of 18.9 and a hemoglobin 1.3.  Does have a left shift.  Normal CK.  CMP with glucose of 518, bicarb of 10, AKI with a creatinine of 2.09.  BUN of 35.  Albumin mildly decreased at 2.0.  Total bili mildly increased at 1.6.  Anion gap of 20 with a GFR of 38.  BNP also slightly elevated at 585.6.  Beta-hydroxybutyrate acid still pending.  Troponin at 23 with repeat of 22, likely rate related.  Lactic acid pending per hospitalist request.  Chest x-ray shows no acute cardiopulmonary process. Per radiologist's interpretation.    Patient appears significantly better.  Only mildly tachycardic at 102 when in the room.  He no longer appears in any acute distress.  Reports that he is feeling steadily better after the fluids and insulin.  Patient will need admitted for DKA.  He is not having any nuchal rigidity or any headache.  I do not think it is any meningitis.  He does have some aches and pains however his lower back pain seems to be more chronic.  I do not appreciate any abnormality seen to the anterior left thigh.  Can be further investigated worked up  while inpatient.  Admitted to [facility] hospitalist.  Portions of this report may have been transcribed using voice recognition software. Every effort was made to ensure accuracy; however, inadvertent computerized transcription errors may be present.   Final Clinical Impression(s) / ED Diagnoses Final diagnoses:  Diabetic ketoacidosis without coma associated with type 2 diabetes mellitus [facility])    Rx / DC Orders ED Discharge Orders     None         Achille Rich, PA-C 11/10/23 2257    Tegeler, Canary Brim, MD 11/13/23 [phone]

## 2023-11-10 NOTE — ED Notes (Signed)
 Patient transported to CT

## 2023-11-10 NOTE — Assessment & Plan Note (Signed)
 Chronic-stable.

## 2023-11-10 NOTE — Assessment & Plan Note (Signed)
 will admit per DKA/ obtain serial BMET, start on glucosestabalizer, aggressive IVF.    So far work up of possible causes of DKA/HSS with CXR, ECG one set of cardiac enzymes, UA.  Troponin mildly elevated UA without any evidence of infection  Most likely cause been noncompliance Monitor in progressive replace potassium as needed.     Consult diabetes coordinator

## 2023-11-10 NOTE — Assessment & Plan Note (Signed)
 Obtain anemia panel  Transfuse for Hg <7 , rapidly dropping or  if symptomatic

## 2023-11-10 NOTE — Subjective & Objective (Signed)
 Presents with uri symptoms Hx of DM2 prior DKA Has had N/V/D has not been checking his BG or taking insulin bc he is not feeling well He has multiple muscle pain  Hx of hereditary lymphedema  Started on insuline drip and fluids

## 2023-11-10 NOTE — Assessment & Plan Note (Signed)
 Contributing to comorbidity and complicating medical management  Body mass index is 43.79 kg/m.  Nutritional follow up as an out pt would be recommended

## 2023-11-10 NOTE — Assessment & Plan Note (Signed)
 Likely contributing to metabolic acidosis.  Will rehydrate Patient is diabetic With significant dehydration. No fevers or chills at this time to suggest infectious process but will assess and monitor.  Check procalcitonin evaluate for any potential source of infection

## 2023-11-10 NOTE — ED Provider Notes (Signed)
 EMERGENCY DEPARTMENT  US GUIDANCE EXAM Emergency Ultrasound:  US Guidance for Needle Guidance  INDICATIONS: Difficult vascular access Linear probe used in real-time to visualize location of needle entry through skin.   PERFORMED BY: Myself IMAGES ARCHIVED?: No LIMITATIONS:  Scar tissue VIEWS USED: Transverse INTERPRETATION: Needle visualized within vein, Left arm, and Needle gauge 18    Makendra Vigeant, Canary Brim, MD 11/10/23 [phone]

## 2023-11-10 NOTE — H&P (Signed)
 Taylor Hughes ZOX:[id number] DOB: [birth year] DOA: 11/10/2023     PCP: Hoy Register, MD      GI Dr. Bernestine Amass)    Patient arrived to ER on 11/10/23 at 1500 Referred by Attending Therisa Doyne, MD   Patient coming from:    home Lives  With SO    Chief Complaint:   Chief Complaint  Patient presents with   Blood Sugar Problem    HPI: Taylor Hughes is a 53 y.o. male with medical history significant of DM2, history of DKA's, history of gastroparesis,, status post right forefoot amputation, hereditary lymphedema, morbid obesity  Presented with blood sugars been elevated Presents with uri symptoms Hx of DM2 prior DKA Has had N/V/D has not been checking his BG or taking insulin bc he is not feeling well He has multiple muscle pain  Hx of hereditary lymphedema  Started on insuline drip and fluids  Patient has been off of oxycodone this month what he usually takes  Denies significant ETOH intake only drinks occasionally Does not smoke   Lab Results  Component Value Date   SARSCOV2NAA NEGATIVE 11/10/2023   SARSCOV2NAA NEGATIVE 03/23/2023   SARSCOV2NAA NEGATIVE 08/13/2022   SARSCOV2NAA NEGATIVE 06/04/2021       Regarding pertinent Chronic problems:    Hyperlipidemia - on statins Lipitor (atorvastatin)  Lipid Panel     Component Value Date/Time   CHOL 173 05/09/2022 1359   TRIG 50 05/09/2022 1359   HDL 71 05/09/2022 1359   LDLCALC 92 05/09/2022 1359   LABVLDL 10 05/09/2022 1359     HTN on metoprolol   chronic CHF diastolic  - last echo  Recent Results (from the past 81191 hours)  ECHOCARDIOGRAM COMPLETE   Collection Time: 03/24/23 11:06 AM  Result Value   Weight 4,797.21   Height 71   BP 125/63   S' Lateral 2.50   AR max vel 2.64   AV Area VTI 2.61   AV Mean grad 5.0   AV Peak grad 8.3   Ao pk vel 1.44   AV Area mean vel 2.46   Est EF 70 - 75%   Narrative      ECHOCARDIOGRAM REPORT       1. Left ventricular ejection fraction, by  estimation, is 70 to 75%. The left ventricle has hyperdynamic function. The left ventricle has no regional wall motion abnormalities. Left ventricular diastolic parameters are indeterminate.  2. Right ventricular systolic function is hyperdynamic. The right ventricular size is severely enlarged.  3. The mitral valve was not well visualized. No evidence of mitral valve regurgitation. No evidence of mitral stenosis.  4. The aortic valve was not well visualized. Aortic valve regurgitation is not visualized.  5. The inferior vena cava is normal in size with greater than 50% respiratory variability, suggesting right atrial pressure of 3 mmHg.  Comparison(s): Prior images reviewed side by side. Function is now hyperdynamic. Heart rate notably faster.            DM 2 -  Lab Results  Component Value Date   HGBA1C 7.9 (A) 06/25/2023   on insulin,      Morbid obesity-   BMI Readings from Last 1 Encounters:  11/10/23 43.79 kg/m     A. Fib -   atrial fibrillation CHA2DS2 vas score    2     Not on anticoagulation          -  Rate control:  Currently controlled with   Metoprolol,  Chronic anemia - baseline hg Hemoglobin & Hematocrit 11 Recent Labs    11/10/23 1607 11/10/23 1717 11/10/23 1922  HGB 13.9 12.9* 11.3*   Iron/TIBC/Ferritin/ %Sat    Component Value Date/Time   IRON 13 (L) 03/26/2023 1137   TIBC 130 (L) 03/26/2023 1137   FERRITIN 259 03/26/2023 1137   IRONPCTSAT 10 (L) 03/26/2023 1137       While in ER: Clinical Course as of 11/10/23 2229  Tue Nov 10, 2023  1835 Following up with lab given that the lab work does not say in process. Nursing reports they think blood was sent down. They are unable to get blood sticks further. The patient currently has one IV with fluids and insulin  [RR]  1836 Lab was not called for follow up. Doing this now.  [RR]    Clinical Course User Index [RR] Achille Rich, PA-C         Lab Orders         Resp panel by RT-PCR  (RSV, Flu A&B, Covid) Anterior Nasal Swab         Beta-hydroxybutyric acid         CBC with Differential         Comprehensive metabolic panel         Urinalysis, Routine w reflex microscopic -Urine, Clean Catch         CK         Brain natriuretic peptide         Basic metabolic panel         CBC         Lipid panel         Lactic acid, plasma         CBG monitoring, ED         I-Stat venous blood gas, ([facility] ED, [facility], [facility])         I-stat chem 8, ED (not at [facility], [facility] or [facility])         CBG monitoring, ED         CBG monitoring, ED         CBG monitoring, ED         CBG monitoring, ED         CBG monitoring, ED         CBG monitoring, ED         CBG monitoring, ED         CBG monitoring, ED         CBG monitoring, ED         CBG monitoring, ED      CXR -  NON acute  CTabd/pelvis - ordered    Following Medications were ordered in ER: Medications  insulin regular, human (MYXREDLIN) 100 units/ 100 mL infusion (15 Units/hr Intravenous New Bag/Given 11/10/23 1645)  dextrose 50 % solution 0-50 mL (has no administration in time range)  potassium chloride 10 mEq in 100 mL IVPB (10 mEq Intravenous Not Given 11/10/23 2101)  potassium chloride 10 mEq in 100 mL IVPB (has no administration in time range)  0.9 %  sodium chloride infusion (has no administration in time range)  lactated ringers bolus 1,000 mL (0 mLs Intravenous Stopped 11/10/23 1731)  lactated ringers bolus 2,848 mL (2,848 mLs Intravenous New Bag/Given 11/10/23 1700)  fentaNYL (SUBLIMAZE) injection 50 mcg (50 mcg Intravenous Given 11/10/23 2003)  lactated ringers bolus 1,000 mL (1,000 mLs Intravenous New Bag/Given 11/10/23 2045)  potassium chloride 10 mEq in 100  mL IVPB (0 mEq Intravenous Stopped 11/10/23 2215)     ED Triage Vitals  Encounter Vitals Group     BP 11/10/23 1515 (!) 153/87     Systolic BP Percentile --      Diastolic BP Percentile --      Pulse Rate 11/10/23 1515 (!) 146     Resp 11/10/23 1511 (!) 26     Temp 11/10/23 1511  98.7 F (37.1 C)     Temp Source 11/10/23 1511 Oral     SpO2 11/10/23 1515 100 %     Weight 11/10/23 1517 (!) 313 lb 15 oz (142.4 kg)     Height 11/10/23 1517 5\' 11"  (1.803 m)     Head Circumference --      Peak Flow --      Pain Score 11/10/23 1517 0     Pain Loc --      Pain Education --      Exclude from Growth Chart --   ZOXW(96)@     _________________________________________ Significant initial  Findings: Abnormal Labs Reviewed  CBC WITH DIFFERENTIAL/PLATELET - Abnormal; Notable for the following components:      Result Value   WBC 18.9 (*)    RBC 4.12 (*)    Hemoglobin 11.3 (*)    HCT 35.6 (*)    RDW 18.4 (*)    Neutro Abs 17.4 (*)    All other components within normal limits  COMPREHENSIVE METABOLIC PANEL WITH GFR - Abnormal; Notable for the following components:   CO2 10 (*)    Glucose, Bld 518 (*)    BUN 35 (*)    Creatinine, Ser 2.09 (*)    Calcium 8.5 (*)    Albumin 2.0 (*)    Total Bilirubin 1.6 (*)    GFR, Estimated 38 (*)    Anion gap 20 (*)    All other components within normal limits  URINALYSIS, ROUTINE W REFLEX MICROSCOPIC - Abnormal; Notable for the following components:   Glucose, UA >=500 (*)    Hgb urine dipstick MODERATE (*)    Ketones, ur 80 (*)    Protein, ur 100 (*)    All other components within normal limits  BRAIN NATRIURETIC PEPTIDE - Abnormal; Notable for the following components:   B Natriuretic Peptide 585.6 (*)    All other components within normal limits  LACTIC ACID, PLASMA - Abnormal; Notable for the following components:   Lactic Acid, Venous 3.7 (*)    All other components within normal limits  BASIC METABOLIC PANEL WITH GFR - Abnormal; Notable for the following components:   CO2 15 (*)    Glucose, Bld 444 (*)    BUN 37 (*)    Creatinine, Ser 2.02 (*)    Calcium 8.6 (*)    GFR, Estimated 39 (*)    Anion gap 16 (*)    All other components within normal limits  CBG MONITORING, ED - Abnormal; Notable for the following  components:   Glucose-Capillary 562 (*)    All other components within normal limits  I-STAT VENOUS BLOOD GAS, ED - Abnormal; Notable for the following components:   pH, Ven 7.168 (*)    pCO2, Ven 19.0 (*)    pO2, Ven 53 (*)    Bicarbonate 6.9 (*)    TCO2 7 (*)    Acid-base deficit 20.0 (*)    Sodium 134 (*)    Calcium, Ion 1.13 (*)    HCT 38.0 (*)    Hemoglobin  12.9 (*)    All other components within normal limits  I-STAT CHEM 8, ED - Abnormal; Notable for the following components:   Sodium 132 (*)    BUN 37 (*)    Creatinine, Ser 1.50 (*)    Glucose, Bld 645 (*)    Calcium, Ion 1.00 (*)    TCO2 8 (*)    All other components within normal limits  CBG MONITORING, ED - Abnormal; Notable for the following components:   Glucose-Capillary 598 (*)    All other components within normal limits  CBG MONITORING, ED - Abnormal; Notable for the following components:   Glucose-Capillary 570 (*)    All other components within normal limits  CBG MONITORING, ED - Abnormal; Notable for the following components:   Glucose-Capillary 518 (*)    All other components within normal limits  CBG MONITORING, ED - Abnormal; Notable for the following components:   Glucose-Capillary 490 (*)    All other components within normal limits  CBG MONITORING, ED - Abnormal; Notable for the following components:   Glucose-Capillary 459 (*)    All other components within normal limits  CBG MONITORING, ED - Abnormal; Notable for the following components:   Glucose-Capillary 430 (*)    All other components within normal limits  CBG MONITORING, ED - Abnormal; Notable for the following components:   Glucose-Capillary 419 (*)    All other components within normal limits  CBG MONITORING, ED - Abnormal; Notable for the following components:   Glucose-Capillary 442 (*)    All other components within normal limits  CBG MONITORING, ED - Abnormal; Notable for the following components:   Glucose-Capillary 377 (*)     All other components within normal limits  TROPONIN I (HIGH SENSITIVITY) - Abnormal; Notable for the following components:   Troponin I (High Sensitivity) 23 (*)    All other components within normal limits  TROPONIN I (HIGH SENSITIVITY) - Abnormal; Notable for the following components:   Troponin I (High Sensitivity) 22 (*)    All other components within normal limits      _________________________ Troponin  ordered Cardiac Panel (last 3 results) Recent Labs    11/10/23 1922 11/10/23 2034  CKTOTAL 88  --   TROPONINIHS 23* 22*     ECG: Ordered Personally reviewed and interpreted by me showing: HR : 145 Rhythm Sinus or ectopic atrial tachycardia Borderline T abnormalities, diffuse leads No significant change since last tracing QTC 426  BNP (last 3 results) Recent Labs    03/28/23 0207 03/29/23 0222 11/10/23 1922  BNP 314.3* 246.2* 585.6*     COVID-19 Labs  No results for input(s): "DDIMER", "FERRITIN", "LDH", "CRP" in the last 72 hours.  Lab Results  Component Value Date   SARSCOV2NAA NEGATIVE 11/10/2023   SARSCOV2NAA NEGATIVE 03/23/2023   SARSCOV2NAA NEGATIVE 08/13/2022   SARSCOV2NAA NEGATIVE 06/04/2021     The recent clinical data is shown below. Vitals:   11/10/23 1645 11/10/23 1700 11/10/23 1900 11/10/23 1934  BP: (!) 157/75 139/82 128/75   Pulse: (!) 147 (!) 149 (!) 105   Resp: (!) 30 (!) 21 13   Temp:    99 F (37.2 C)  TempSrc:      SpO2: 100% 100% 100%   Weight:      Height:        WBC     Component Value Date/Time   WBC 18.9 (H) 11/10/2023 1922   LYMPHSABS 0.7 11/10/2023 1922   LYMPHSABS 1.3 06/25/2023 1537  MONOABS 0.7 11/10/2023 1922   EOSABS 0.0 11/10/2023 1922   EOSABS 0.1 06/25/2023 1537   BASOSABS 0.0 11/10/2023 1922   BASOSABS 0.0 06/25/2023 1537    Lactic Acid, Venous    Component Value Date/Time   LATICACIDVEN 3.7 (HH) 11/10/2023 2137     Procalcitonin   Ordered      UA   no evidence of UTI    Urine analysis:     Component Value Date/Time   COLORURINE YELLOW 11/10/2023 1551   APPEARANCEUR CLEAR 11/10/2023 1551   APPEARANCEUR Clear 06/25/2023 1537   LABSPEC 1.018 11/10/2023 1551   PHURINE 5.0 11/10/2023 1551   GLUCOSEU >=500 (A) 11/10/2023 1551   HGBUR MODERATE (A) 11/10/2023 1551   BILIRUBINUR NEGATIVE 11/10/2023 1551   BILIRUBINUR Negative 06/25/2023 1537   KETONESUR 80 (A) 11/10/2023 1551   PROTEINUR 100 (A) 11/10/2023 1551   UROBILINOGEN 0.2 11/14/2021 1146   NITRITE NEGATIVE 11/10/2023 1551   LEUKOCYTESUR NEGATIVE 11/10/2023 1551    Results for orders placed or performed during the hospital encounter of 11/10/23  Resp panel by RT-PCR (RSV, Flu A&B, Covid) Urine, Clean Catch     Status: None   Collection Time: 11/10/23  3:55 PM   Specimen: Urine, Clean Catch; Nasal Swab  Result Value Ref Range Status   SARS Coronavirus 2 by RT PCR NEGATIVE NEGATIVE Final   Influenza A by PCR NEGATIVE NEGATIVE Final   Influenza B by PCR NEGATIVE NEGATIVE Final         Resp Syncytial Virus by PCR NEGATIVE NEGATIVE Final          ________________________________________________________________  Venous  Blood Gas result:  pH   Latest Reference Range & Units 11/10/23 17:17  pH, Ven 7.25 - 7.43  7.168 (LL)  pCO2, Ven 44 - 60 mmHg 19.0 (LL)  pO2, Ven 32 - 45 mmHg 53 (H)  (LL): Data is critically low (H): Data is abnormally high    __________________________________________________________ Recent Labs  Lab 11/10/23 1607 11/10/23 1717 11/10/23 1922 11/10/23 2137  NA 132* 134* 137 136  K 5.0 4.6 3.7 3.6  CO2  --   --  10* 15*  GLUCOSE 645*  --  518* 444*  BUN 37*  --  35* 37*  CREATININE 1.50*  --  2.09* 2.02*  CALCIUM  --   --  8.5* 8.6*    Cr  Up from baseline see below Lab Results  Component Value Date   CREATININE 2.02 (H) 11/10/2023   CREATININE 2.09 (H) 11/10/2023   CREATININE 1.50 (H) 11/10/2023    Recent Labs  Lab 11/10/23 1922  AST 15  ALT 12  ALKPHOS 102  BILITOT 1.6*   PROT 7.0  ALBUMIN 2.0*   Lab Results  Component Value Date   CALCIUM 8.6 (L) 11/10/2023   PHOS 2.7 03/26/2023    Plt: Lab Results  Component Value Date   PLT 314 11/10/2023       Recent Labs  Lab 11/10/23 1607 11/10/23 1717 11/10/23 1922  WBC  --   --  18.9*  NEUTROABS  --   --  17.4*  HGB 13.9 12.9* 11.3*  HCT 41.0 38.0* 35.6*  MCV  --   --  86.4  PLT  --   --  314    HG/HCT   stable,      Component Value Date/Time   HGB 11.3 (L) 11/10/2023 1922   HGB 11.1 (L) 06/25/2023 1537   HCT 35.6 (L) 11/10/2023 1922  HCT 35.4 (L) 06/25/2023 1537   MCV 86.4 11/10/2023 1922   MCV 92 06/25/2023 1537    _______________________________________________ Hospitalist was called for admission for DKA    The following Work up has been ordered so far:  Orders Placed This Encounter  Procedures   Resp panel by RT-PCR (RSV, Flu A&B, Covid) Anterior Nasal Swab   DG Chest Portable 1 View   CT RENAL STONE STUDY   Beta-hydroxybutyric acid   CBC with Differential   Comprehensive metabolic panel   Urinalysis, Routine w reflex microscopic -Urine, Clean Catch   CK   Brain natriuretic peptide   Basic metabolic panel   CBC   Lipid panel   Lactic acid, plasma   Diet NPO time specified   ED Cardiac monitoring   Initiate Carrier Fluid Protocol   Notify physician (specify)   If present, discontinue Insulin Pump after IV Insulin is initiated.   Do NOT use lab glucose values in [employer].  If CBG meter reads "Critical High", enter 600.   IV insulin infusion with sufficient glucose should be continued until MD determines acidosis is corrected and places transition orders.   Upon IV fluid bolus completion, place order for STAT BMET (LAB15) and call provider with results.   Cardiac Monitoring - Continuous Indefinite   Full code   Consult to hospitalist   Consult to diabetes coordinator   Airborne and Contact precautions   CBG monitoring, ED   I-Stat venous blood gas, ([facility] ED, [facility], [facility])    I-stat chem 8, ED (not at [facility], [facility] or [facility])   CBG monitoring, ED   CBG monitoring, ED   CBG monitoring, ED   CBG monitoring, ED   CBG monitoring, ED   CBG monitoring, ED   CBG monitoring, ED   CBG monitoring, ED   CBG monitoring, ED   CBG monitoring, ED   EKG 12-Lead   ED EKG   Insert peripheral IV   Place in observation (patient's expected length of stay will be less than 2 midnights)   VAS [place] LOWER EXTREMITY VENOUS (DVT)     OTHER Significant initial  Findings:  labs showing:     DM  labs:  HbA1C: Recent Labs    01/19/23 1126 03/23/23 0005 06/25/23 1529  HGBA1C 9.7* 9.8* 7.9*      CBG (last 3)  Recent Labs    11/10/23 2112 11/10/23 2141 11/10/23 2241  GLUCAP 442* 377* 345*        Cultures:    Component Value Date/Time   SDES TISSUE RIGHT TRANSMETATARSAL 03/25/2023 1230   SPECREQUEST A 03/25/2023 1230   CULT  03/25/2023 1230    FEW BACTEROIDES FRAGILIS BETA LACTAMASE POSITIVE FEW SCHAALIA ODONTOLYTICUS Standardized susceptibility testing for this organism is not available. FEW STREPTOCOCCUS INTERMEDIUS Sent to [facility] for further susceptibility testing. SEE SEPARATE REPORT Performed at [facility], [address] [place], [place] [address]    REPTSTATUS 04/04/2023 FINAL 03/25/2023 1230     Radiological Exams on Admission: DG Chest Portable 1 View Result Date: 11/10/2023 CLINICAL DATA:  DKA.  Shortness of breath. EXAM: PORTABLE CHEST 1 VIEW COMPARISON:  03/25/2023. FINDINGS: Bilateral lung fields are clear. Bilateral costophrenic angles are clear. Note is made of elevated right hemidiaphragm. Stable cardio-mediastinal silhouette. No acute osseous abnormalities. The soft tissues are within normal limits. IMPRESSION: *No active disease. Electronically Signed   By: Jules Schick M.D.   On: 11/10/2023 17:34   _______________________________________________________________________________________________________ Latest  Blood pressure 128/75,  pulse Marland Kitchen)  105, temperature 99 F (37.2 C), resp. rate 13, height 5\' 11"  (1.803 m), weight (!) 142.4 kg, SpO2 100%.   Vitals  labs and radiology finding personally reviewed  Review of Systems:    Pertinent positives include:  leg pain  , fatigue nausea, vomiting, flank pain.  Constitutional:  No weight loss, night sweats, Fevers, chills, weight loss  HEENT:  No headaches, Difficulty swallowing,Tooth/dental problems,Sore throat,  No sneezing, itching, ear ache, nasal congestion, post nasal drip,  Cardio-vascular:  No chest pain, Orthopnea, PND, anasarca, dizziness, palpitations.no Bilateral lower extremity swelling  GI:  No heartburn, indigestion, abdominal pain, diarrhea, change in bowel habits, loss of appetite, melena, blood in stool, hematemesis Resp:  no shortness of breath at rest. No dyspnea on exertion, No excess mucus, no productive cough, No non-productive cough, No coughing up of blood.No change in color of mucus.No wheezing. Skin:  no rash or lesions. No jaundice GU:  no dysuria, change in color of urine, no urgency or frequency. No straining to urinate.  No  Musculoskeletal:  No joint pain or no joint swelling. No decreased range of motion. No back pain.  Psych:  No change in mood or affect. No depression or anxiety. No memory loss.  Neuro: no localizing neurological complaints, no tingling, no weakness, no double vision, no gait abnormality, no slurred speech, no confusion  All systems reviewed and apart from HOPI all are negative _______________________________________________________________________________________________ Past Medical History:   Past Medical History:  Diagnosis Date   Atrial fibrillation (HCC)    Diabetes mellitus without complication (HCC)    Type 2   GERD (gastroesophageal reflux disease)    Hyperlipidemia    Hypertension    Morbid (severe) obesity due to excess calories (HCC) 08/07/2023   bmi 44.63   Wears glasses    Wound, open, foot     left diabetic      Past Surgical History:  Procedure Laterality Date   AMPUTATION Left 08/20/2018   Procedure: LEFT FOOT IRRIGATON AND DEBRIDEMENT, 5TH RAY AMPUTATION;  Surgeon: Nadara Mustard, MD;  Location: [facility] OR;  Service: Orthopedics;  Laterality: Left;   AMPUTATION Right 03/25/2023   Procedure: RIGHT TRANSMETATARSAL AMPUTATION;  Surgeon: Nadara Mustard, MD;  Location: [facility] OR;  Service: Orthopedics;  Laterality: Right;   APPLICATION OF A-CELL OF CHEST/ABDOMEN N/A 05/30/2019   Procedure: Application Of A-Cell Of Chest;  Surgeon: Corliss Skains, MD;  Location: [facility] OR;  Service: Cardiothoracic;  Laterality: N/A;   APPLICATION OF WOUND VAC N/A 05/30/2019   Procedure: Application Of Wound Vac;  Surgeon: Corliss Skains, MD;  Location: [facility] OR;  Service: Cardiothoracic;  Laterality: N/A;   FINGER SURGERY Right 2016   I&D  small finger   I & D EXTREMITY Left 07/30/2018   Procedure: IRRIGATION AND DEBRIDEMENT LEFT FOOT WITH POSSIBLE AMPUTATION OF FIFTH TOE;  Surgeon: Kathryne Hitch, MD;  Location: [facility] ORS;  Service: Orthopedics;  Laterality: Left;   INCISION AND DRAINAGE ABSCESS Left 01/19/2019   Procedure: INCISION AND DRAINAGE ABSCESS upper chest;  Surgeon: Christia Reading, MD;  Location: [facility] OR;  Service: ENT;  Laterality: Left;   IRRIGATION AND DEBRIDEMENT STERNOCLAVICULAR JOINT-STERNUM AND RIBS N/A 05/30/2019   Procedure: IRRIGATION AND DEBRIDEMENT OF STERNOCLAVICULAR JOINT-STERNUM AND RIBS ;  Surgeon: Corliss Skains, MD;  Location: [facility] OR;  Service: Cardiothoracic;  Laterality: N/A;   MINOR IRRIGATION AND DEBRIDEMENT OF WOUND N/A 11/22/2018   Procedure: INCISION AND DRAINAGE OF NECK ABSCESS;  Surgeon: Christia Reading, MD;  Location: [facility];  Service: ENT;  Laterality: N/A;    Social History:  Ambulatory   cane,        reports that he has never smoked. He has never used smokeless tobacco. He reports that he does not drink alcohol and does not use drugs.    Family  History:   Family History  Problem Relation Age of Onset   Diabetes Mother    Colon cancer Brother    Esophageal cancer Neg Hx    Rectal cancer Neg Hx    Stomach cancer Neg Hx    ______________________________________________________________________________________________ Allergies: No Known Allergies   Prior to Admission medications   Medication Sig Start Date End Date Taking? Authorizing Provider  Menthol, Topical Analgesic, (ICY HOT BACK) 5 % PTCH Apply 1 patch topically daily.   Yes [provider]  Accu-Chek Softclix Lancets lancets Use to check blood sugar 3 times daily. 06/02/23  Yes Hoy Register, MD  atorvastatin (LIPITOR) 20 MG tablet Take 1 tablet (20 mg total) by mouth daily. 01/19/23  Yes Hoy Register, MD  Blood Glucose Monitoring Suppl (ACCU-CHEK GUIDE) w/Device KIT Use to check blood sugar 3 times daily. 06/02/23  Yes Hoy Register, MD  gabapentin (NEURONTIN) 300 MG capsule Take 1 capsule (300 mg total) by mouth 2 (two) times daily. 10/06/23  Yes Newlin, Odette Horns, MD  glucose blood (ACCU-CHEK GUIDE TEST) test strip use as directed 3 (three) times daily. 06/02/23  Yes Newlin, Odette Horns, MD  glucose blood (ACCU-CHEK GUIDE) test strip Use to check blood sugar 3 times daily. 06/02/23  Yes Newlin, Odette Horns, MD  insulin glargine (LANTUS SOLOSTAR) 100 UNIT/ML Solostar Pen Inject 10 Units into the skin daily. 09/25/23  Yes Newlin, Odette Horns, MD  insulin lispro (HUMALOG KWIKPEN) 100 UNIT/ML KwikPen Inject 0-12 units into the skin three times daily with meals. Per sliding scale. 07/22/23  Yes Hoy Register, MD  Insulin Pen Needle 32G X 6 MM MISC USE AS DIRECTED 11/17/22  Yes Hoy Register, MD  metoprolol tartrate (LOPRESSOR) 25 MG tablet Take 1 tablet (25 mg total) by mouth 2 (two) times daily. 05/14/23  Yes Hoy Register, MD  omeprazole (PRILOSEC) 40 MG capsule Take 1 capsule (40 mg total) by mouth daily. 01/19/23  Yes Hoy Register, MD  oxyCODONE (OXY IR/ROXICODONE) 5  MG immediate release tablet Take 1 tablet (5 mg total) by mouth every 8 (eight) hours. 09/24/23  Yes   sildenafil (VIAGRA) 50 MG tablet Take 1 tablet (50 mg total) by mouth daily as needed for erectile dysfunction. At least 24 hours between doses 10/23/23  Yes Newlin, Enobong, MD  valsartan (DIOVAN) 160 MG tablet Take 1 tablet (160 mg total) by mouth daily. 09/25/23  Yes Hoy Register, MD  furosemide (LASIX) 20 MG tablet Take 2 tablets (40 mg total) by mouth daily. Patient not taking: Reported on 08/07/2023 05/14/23 11/04/23  Hoy Register, MD    ___________________________________________________________________________________________________ Physical Exam:    11/10/2023    7:00 PM 11/10/2023    5:00 PM 11/10/2023    4:45 PM  Vitals with BMI  Systolic 128 139 161  Diastolic 75 82 75  Pulse 105 149 147     1. General:  in No  Acute distress    Chronically ill -appearing 2. Psychological: Alert and   Oriented 3. Head/ENT:   Dry Mucous Membranes                          Head Non traumatic, neck  supple                          Poor Dentition 4. SKIN:  decreased Skin turgor,  Skin clean Dry and intact no rash    5. Heart: Regular rate and rhythm no  Murmur, no Rub or gallop 6. Lungs , no wheezes or crackles   7. Abdomen: Soft,  non-tender, Non distended   obese  bowel sounds present 8. Lower extremities: no clubbing, cyanosis, trace edema 9. Neurologically Grossly intact, moving all 4 extremities equally intact 10. MSK: Normal range of motion    Chart has been reviewed  ______________________________________________________________________________________________  Assessment/Plan 53 y.o. male with medical history significant of DM2, history of DKA's, history of gastroparesis,, status post right forefoot amputation, hereditary lymphedema, morbid obesity   Admitted for DKA    Present on Admission:  Atrial fibrillation, chronic (HCC)  DKA (diabetic ketoacidosis) (HCC)  DKA, type  2 (HCC)  AKI (acute kidney injury) (HCC)  Essential hypertension  HLD (hyperlipidemia)  Flank pain  Gastroparesis  Left thigh pain  Neck pain  Anemia  Elevated troponin  Lactic acidosis     Atrial fibrillation, chronic (HCC) Not on anticoagulation Resume metoprolol when blood pressure allows  DKA (diabetic ketoacidosis) (HCC) will admit per DKA/ obtain serial BMET, start on glucosestabalizer, aggressive IVF.    So far work up of possible causes of DKA/HSS with CXR, ECG one set of cardiac enzymes, UA.  Troponin mildly elevated UA without any evidence of infection  Most likely cause been noncompliance Monitor in progressive replace potassium as needed.     Consult diabetes coordinator    AKI (acute kidney injury) (HCC) In the setting of dehydration and DKA will rehydrate and follow fluid status obtain electrolytes  Essential hypertension Resume metoprolol at home dose for blood pressure allows  HLD (hyperlipidemia) Check lipid panel  Lymphedema Chronic stable  Morbid obesity with BMI of 45.0-49.9, adult (HCC) Contributing to comorbidity and complicating medical management  Body mass index is 43.79 kg/m.  Nutritional follow up as an out pt would be recommended   Flank pain Obtain renal CT  Gastroparesis Reglan PRN  Left thigh pain Obtain doppler to eval for DVT  Neck pain Reproducible by palpation Appears to be musculoskeletal  Anemia Obtain anemia panel  Transfuse for Hg <7 , rapidly dropping or  if symptomatic   Elevated troponin Elevated in the setting of DKA Denies any chest pain Appears to be stable continue to monitor echo in a.m.  Lactic acidosis Likely contributing to metabolic acidosis.  Will rehydrate Patient is diabetic With significant dehydration. No fevers or chills at this time to suggest infectious process but will assess and monitor.  Check procalcitonin evaluate for any potential source of infection   Other plan as per  orders.  DVT prophylaxis:  SCD     Code Status:    Code Status: Full Code FULL CODE e as per patient   I had personally discussed CODE STATUS with patient   ACP   none   Family Communication:   Family not at  Bedside    Diet  Diet Orders (From admission, onward)     Start     Ordered   11/10/23 1627  Diet NPO time specified  Diet effective now        11/10/23 1627            Disposition Plan:      To home once  workup is complete and patient is stable   Following barriers for discharge:                             DKA resolved                            Electrolytes corrected                               Anemia  stable                             Pain controlled with PO medications                                                        Will need to be able to tolerate PO                                   Consult Orders  (From admission, onward)           Start     Ordered   11/10/23 2029  Consult to hospitalist  Pg by Viviann Spare  Once       Provider:  (Not yet assigned)  Question Answer Comment  Place call to: [facility]   Reason for Consult Admit      11/10/23 2028                              Diabetes care coordinator                      Consults called:  none     Admission status:  ED Disposition     ED Disposition  Admit   Condition  --   Comment  Hospital Area: [facility] [[id number]]  Level of Care: Progressive [102]  Admit to Progressive based on following criteria: GI, ENDOCRINE disease patients with GI bleeding, acute liver failure or pancreatitis, stable with diabetic ketoacidosis or thyrotoxicosis (hypothyroid) state.  May place patient in observation at [facility] or [facility] if equivalent level of care is available:: No  Covid Evaluation: Asymptomatic - no recent exposure (last 10 days) testing not required  Diagnosis: DKA, type 2 [facility]) [[id number]]  Admitting Physician: Therisa Doyne [[id number]]  Attending  Physician: Therisa Doyne [[id number]]           Obs     Level of care    progressive       Lab Results  Component Value Date   SARSCOV2NAA NEGATIVE 11/10/2023     Precautions: admitted as   Covid Negative   Gricel Copen 11/10/2023, 10:48 PM    [facility]     after 2 AM please page floor coverage PA If 7AM-7PM, please contact the day team taking care of the patient using [email or web address]

## 2023-11-10 NOTE — Assessment & Plan Note (Signed)
 In the setting of dehydration and DKA will rehydrate and follow fluid status obtain electrolytes

## 2023-11-10 NOTE — Assessment & Plan Note (Signed)
 Resume metoprolol at home dose for blood pressure allows

## 2023-11-10 NOTE — Assessment & Plan Note (Signed)
 Obtain doppler to eval for DVT

## 2023-11-10 NOTE — Assessment & Plan Note (Signed)
 Obtain renal CT

## 2023-11-10 NOTE — ED Triage Notes (Signed)
 Pt sick at home with CBG that only reading high

## 2023-11-10 NOTE — Assessment & Plan Note (Addendum)
 Reproducible by palpation Appears to be musculoskeletal

## 2023-11-11 ENCOUNTER — Observation Stay (HOSPITAL_COMMUNITY)

## 2023-11-11 ENCOUNTER — Inpatient Hospital Stay (HOSPITAL_COMMUNITY)

## 2023-11-11 ENCOUNTER — Other Ambulatory Visit: Payer: Self-pay

## 2023-11-11 ENCOUNTER — Ambulatory Visit: Payer: PRIVATE HEALTH INSURANCE | Attending: Family Medicine | Admitting: Family Medicine

## 2023-11-11 DIAGNOSIS — M722 Plantar fascial fibromatosis: Secondary | ICD-10-CM | POA: Diagnosis not present

## 2023-11-11 DIAGNOSIS — L97521 Non-pressure chronic ulcer of other part of left foot limited to breakdown of skin: Secondary | ICD-10-CM | POA: Diagnosis not present

## 2023-11-11 DIAGNOSIS — L97429 Non-pressure chronic ulcer of left heel and midfoot with unspecified severity: Secondary | ICD-10-CM | POA: Diagnosis not present

## 2023-11-11 DIAGNOSIS — Z992 Dependence on renal dialysis: Secondary | ICD-10-CM | POA: Diagnosis not present

## 2023-11-11 DIAGNOSIS — E11610 Type 2 diabetes mellitus with diabetic neuropathic arthropathy: Secondary | ICD-10-CM | POA: Diagnosis not present

## 2023-11-11 DIAGNOSIS — E11621 Type 2 diabetes mellitus with foot ulcer: Secondary | ICD-10-CM | POA: Diagnosis present

## 2023-11-11 DIAGNOSIS — I482 Chronic atrial fibrillation, unspecified: Secondary | ICD-10-CM | POA: Diagnosis not present

## 2023-11-11 DIAGNOSIS — Z6841 Body Mass Index (BMI) 40.0 and over, adult: Secondary | ICD-10-CM | POA: Diagnosis not present

## 2023-11-11 DIAGNOSIS — Z1152 Encounter for screening for COVID-19: Secondary | ICD-10-CM | POA: Diagnosis not present

## 2023-11-11 DIAGNOSIS — R7989 Other specified abnormal findings of blood chemistry: Secondary | ICD-10-CM | POA: Diagnosis not present

## 2023-11-11 DIAGNOSIS — N10 Acute tubulo-interstitial nephritis: Secondary | ICD-10-CM | POA: Diagnosis present

## 2023-11-11 DIAGNOSIS — I5032 Chronic diastolic (congestive) heart failure: Secondary | ICD-10-CM | POA: Diagnosis present

## 2023-11-11 DIAGNOSIS — I4719 Other supraventricular tachycardia: Secondary | ICD-10-CM | POA: Diagnosis not present

## 2023-11-11 DIAGNOSIS — E278 Other specified disorders of adrenal gland: Secondary | ICD-10-CM | POA: Diagnosis not present

## 2023-11-11 DIAGNOSIS — E1143 Type 2 diabetes mellitus with diabetic autonomic (poly)neuropathy: Secondary | ICD-10-CM | POA: Diagnosis not present

## 2023-11-11 DIAGNOSIS — E860 Dehydration: Secondary | ICD-10-CM | POA: Diagnosis not present

## 2023-11-11 DIAGNOSIS — M79662 Pain in left lower leg: Secondary | ICD-10-CM

## 2023-11-11 DIAGNOSIS — I480 Paroxysmal atrial fibrillation: Secondary | ICD-10-CM | POA: Diagnosis not present

## 2023-11-11 DIAGNOSIS — M14672 Charcot's joint, left ankle and foot: Secondary | ICD-10-CM | POA: Diagnosis not present

## 2023-11-11 DIAGNOSIS — Z794 Long term (current) use of insulin: Secondary | ICD-10-CM | POA: Diagnosis not present

## 2023-11-11 DIAGNOSIS — M65972 Unspecified synovitis and tenosynovitis, left ankle and foot: Secondary | ICD-10-CM | POA: Diagnosis not present

## 2023-11-11 DIAGNOSIS — E1110 Type 2 diabetes mellitus with ketoacidosis without coma: Secondary | ICD-10-CM | POA: Diagnosis not present

## 2023-11-11 DIAGNOSIS — N179 Acute kidney failure, unspecified: Secondary | ICD-10-CM | POA: Diagnosis not present

## 2023-11-11 DIAGNOSIS — I110 Hypertensive heart disease with heart failure: Secondary | ICD-10-CM | POA: Diagnosis present

## 2023-11-11 DIAGNOSIS — I2489 Other forms of acute ischemic heart disease: Secondary | ICD-10-CM | POA: Diagnosis not present

## 2023-11-11 DIAGNOSIS — R9389 Abnormal findings on diagnostic imaging of other specified body structures: Secondary | ICD-10-CM | POA: Diagnosis not present

## 2023-11-11 DIAGNOSIS — A419 Sepsis, unspecified organism: Secondary | ICD-10-CM | POA: Diagnosis not present

## 2023-11-11 DIAGNOSIS — R0602 Shortness of breath: Secondary | ICD-10-CM | POA: Diagnosis not present

## 2023-11-11 DIAGNOSIS — E039 Hypothyroidism, unspecified: Secondary | ICD-10-CM | POA: Diagnosis present

## 2023-11-11 LAB — BASIC METABOLIC PANEL WITH GFR
Anion gap: 9 (ref 5–15)
Anion gap: 11 (ref 5–15)
Anion gap: 12 (ref 5–15)
BUN: 33 mg/dL — ABNORMAL HIGH (ref 6–20)
BUN: 34 mg/dL — ABNORMAL HIGH (ref 6–20)
BUN: 34 mg/dL — ABNORMAL HIGH (ref 6–20)
CO2: 20 mmol/L — ABNORMAL LOW (ref 22–32)
CO2: 19 mmol/L — ABNORMAL LOW (ref 22–32)
CO2: 16 mmol/L — ABNORMAL LOW (ref 22–32)
Calcium: 8.2 mg/dL — ABNORMAL LOW (ref 8.9–10.3)
Calcium: 8.6 mg/dL — ABNORMAL LOW (ref 8.9–10.3)
Calcium: 8.5 mg/dL — ABNORMAL LOW (ref 8.9–10.3)
Chloride: 109 mmol/L (ref 98–111)
Chloride: 108 mmol/L (ref 98–111)
Chloride: 107 mmol/L (ref 98–111)
Creatinine, Ser: 1.43 mg/dL — ABNORMAL HIGH (ref 0.61–1.24)
Creatinine, Ser: 1.51 mg/dL — ABNORMAL HIGH (ref 0.61–1.24)
Creatinine, Ser: 1.47 mg/dL — ABNORMAL HIGH (ref 0.61–1.24)
GFR, Estimated: 59 mL/min — ABNORMAL LOW (ref >60)
GFR, Estimated: 56 mL/min — ABNORMAL LOW (ref >60)
GFR, Estimated: 57 mL/min — ABNORMAL LOW (ref >60)
Glucose, Bld: 161 mg/dL — ABNORMAL HIGH (ref 70–99)
Glucose, Bld: 137 mg/dL — ABNORMAL HIGH (ref 70–99)
Glucose, Bld: 254 mg/dL — ABNORMAL HIGH (ref 70–99)
Potassium: 3.6 mmol/L (ref 3.5–5.1)
Potassium: 3.5 mmol/L (ref 3.5–5.1)
Potassium: 4 mmol/L (ref 3.5–5.1)
Sodium: 138 mmol/L (ref 135–145)
Sodium: 138 mmol/L (ref 135–145)
Sodium: 135 mmol/L (ref 135–145)

## 2023-11-11 LAB — LIPID PANEL
Cholesterol: 153 mg/dL (ref 0–200)
HDL: 60 mg/dL (ref >40)
LDL Cholesterol: 81 mg/dL (ref 0–99)
Total CHOL/HDL Ratio: 2.6 ratio
Triglycerides: 61 mg/dL (ref <150)
VLDL: 12 mg/dL (ref 0–40)

## 2023-11-11 LAB — HEMOGLOBIN A1C
Hgb A1c MFr Bld: 9.1 % — ABNORMAL HIGH (ref 4.8–5.6)
Mean Plasma Glucose: 214.47 mg/dL

## 2023-11-11 LAB — CREATININE, URINE, RANDOM: Creatinine, Urine: 167 mg/dL

## 2023-11-11 LAB — CBC
HCT: 32.7 % — ABNORMAL LOW (ref 39.0–52.0)
Hemoglobin: 10.7 g/dL — ABNORMAL LOW (ref 13.0–17.0)
MCH: 27.3 pg (ref 26.0–34.0)
MCHC: 32.7 g/dL (ref 30.0–36.0)
MCV: 83.4 fL (ref 80.0–100.0)
Platelets: 302 10*3/uL (ref 150–400)
RBC: 3.92 MIL/uL — ABNORMAL LOW (ref 4.22–5.81)
RDW: 17.9 % — ABNORMAL HIGH (ref 11.5–15.5)
WBC: 15.1 10*3/uL — ABNORMAL HIGH (ref 4.0–10.5)
nRBC: 0 % (ref 0.0–0.2)

## 2023-11-11 LAB — URINALYSIS, ROUTINE W REFLEX MICROSCOPIC
Bilirubin Urine: NEGATIVE
Glucose, UA: 50 mg/dL — AB
Ketones, ur: 20 mg/dL — AB
Leukocytes,Ua: NEGATIVE
Nitrite: NEGATIVE
Protein, ur: >=300 mg/dL — AB
Specific Gravity, Urine: 1.029 (ref 1.005–1.030)
pH: 6 (ref 5.0–8.0)

## 2023-11-11 LAB — CBG MONITORING, ED
Glucose-Capillary: 139 mg/dL — ABNORMAL HIGH (ref 70–99)
Glucose-Capillary: 214 mg/dL — ABNORMAL HIGH (ref 70–99)
Glucose-Capillary: 235 mg/dL — ABNORMAL HIGH (ref 70–99)

## 2023-11-11 LAB — GLUCOSE, CAPILLARY
Glucose-Capillary: 154 mg/dL — ABNORMAL HIGH (ref 70–99)
Glucose-Capillary: 163 mg/dL — ABNORMAL HIGH (ref 70–99)
Glucose-Capillary: 126 mg/dL — ABNORMAL HIGH (ref 70–99)
Glucose-Capillary: 140 mg/dL — ABNORMAL HIGH (ref 70–99)
Glucose-Capillary: 124 mg/dL — ABNORMAL HIGH (ref 70–99)
Glucose-Capillary: 140 mg/dL — ABNORMAL HIGH (ref 70–99)
Glucose-Capillary: 232 mg/dL — ABNORMAL HIGH (ref 70–99)
Glucose-Capillary: 243 mg/dL — ABNORMAL HIGH (ref 70–99)

## 2023-11-11 LAB — PROCALCITONIN: Procalcitonin: 5.15 ng/mL

## 2023-11-11 LAB — ECHOCARDIOGRAM COMPLETE
Height: 71 in
S' Lateral: 2.8 cm
Weight: 4536.18 [oz_av]

## 2023-11-11 LAB — PHOSPHORUS: Phosphorus: <1 mg/dL — CL (ref 2.5–4.6)

## 2023-11-11 LAB — LACTIC ACID, PLASMA
Lactic Acid, Venous: 3.4 mmol/L (ref 0.5–1.9)
Lactic Acid, Venous: 2.9 mmol/L (ref 0.5–1.9)
Lactic Acid, Venous: 1.9 mmol/L (ref 0.5–1.9)

## 2023-11-11 LAB — SODIUM, URINE, RANDOM: Sodium, Ur: 12 mmol/L

## 2023-11-11 LAB — HIV ANTIBODY (ROUTINE TESTING W REFLEX): HIV Screen 4th Generation wRfx: NONREACTIVE

## 2023-11-11 LAB — BLOOD GAS, VENOUS
Acid-base deficit: 2.6 mmol/L — ABNORMAL HIGH (ref 0.0–2.0)
Bicarbonate: 21.8 mmol/L (ref 20.0–28.0)
O2 Saturation: 87.5 %
Patient temperature: 36.3
pCO2, Ven: 35 mmHg — ABNORMAL LOW (ref 44–60)
pH, Ven: 7.4 (ref 7.25–7.43)
pO2, Ven: 53 mmHg — ABNORMAL HIGH (ref 32–45)

## 2023-11-11 LAB — OSMOLALITY: Osmolality: 310 mosm/kg — ABNORMAL HIGH (ref 275–295)

## 2023-11-11 LAB — MRSA NEXT GEN BY PCR, NASAL: MRSA by PCR Next Gen: DETECTED — AB

## 2023-11-11 LAB — OSMOLALITY, URINE: Osmolality, Ur: 562 mosm/kg (ref 300–900)

## 2023-11-11 LAB — MAGNESIUM: Magnesium: 1.8 mg/dL (ref 1.7–2.4)

## 2023-11-11 MED ORDER — ATORVASTATIN CALCIUM 10 MG PO TABS
20.0000 mg | ORAL_TABLET | Freq: Every day | ORAL | Status: DC
  Administered 2023-11-11 – 2023-11-17 (×7): 20 mg via ORAL
  Filled 2023-11-11 (×7): qty 2

## 2023-11-11 MED ORDER — LACTATED RINGERS IV BOLUS
1000.0000 mL | Freq: Once | INTRAVENOUS | Status: AC
  Administered 2023-11-11: 1000 mL via INTRAVENOUS

## 2023-11-11 MED ORDER — INSULIN ASPART 100 UNIT/ML IJ SOLN: 0.0000 [IU] | Freq: Three times a day (TID) | INTRAMUSCULAR | Status: DC

## 2023-11-11 MED ORDER — K PHOS MONO-SOD PHOS DI & MONO 155-852-130 MG PO TABS
500.0000 mg | ORAL_TABLET | Freq: Two times a day (BID) | ORAL | Status: DC
  Administered 2023-11-11 – 2023-11-13 (×4): 500 mg via ORAL
  Filled 2023-11-11 (×4): qty 2

## 2023-11-11 MED ORDER — METOPROLOL TARTRATE 5 MG/5ML IV SOLN
5.0000 mg | Freq: Four times a day (QID) | INTRAVENOUS | Status: DC | PRN
  Administered 2023-11-11 – 2023-11-15 (×4): 5 mg via INTRAVENOUS
  Filled 2023-11-11 (×4): qty 5

## 2023-11-11 MED ORDER — INSULIN ASPART 100 UNIT/ML IJ SOLN
0.0000 [IU] | Freq: Every day | INTRAMUSCULAR | Status: DC
  Administered 2023-11-11: 2 [IU] via SUBCUTANEOUS
  Administered 2023-11-12: 3 [IU] via SUBCUTANEOUS
  Administered 2023-11-14: 2 [IU] via SUBCUTANEOUS

## 2023-11-11 MED ORDER — VANCOMYCIN HCL 2000 MG/400ML IV SOLN
2000.0000 mg | Freq: Once | INTRAVENOUS | Status: AC
  Administered 2023-11-11: 2000 mg via INTRAVENOUS
  Filled 2023-11-11: qty 400

## 2023-11-11 MED ORDER — CHLORHEXIDINE GLUCONATE CLOTH 2 % EX PADS
6.0000 | MEDICATED_PAD | Freq: Every day | CUTANEOUS | Status: AC
  Administered 2023-11-11 – 2023-11-15 (×3): 6 via TOPICAL

## 2023-11-11 MED ORDER — THIAMINE MONONITRATE 100 MG PO TABS
100.0000 mg | ORAL_TABLET | Freq: Every day | ORAL | Status: AC
  Administered 2023-11-11 – 2023-11-15 (×5): 100 mg via ORAL
  Filled 2023-11-11 (×5): qty 1

## 2023-11-11 MED ORDER — DEXTROSE IN LACTATED RINGERS 5 % IV SOLN: INTRAVENOUS | Status: DC

## 2023-11-11 MED ORDER — GABAPENTIN 300 MG PO CAPS
300.0000 mg | ORAL_CAPSULE | Freq: Two times a day (BID) | ORAL | Status: DC
Start: 2023-11-11 — End: 2023-11-17
  Administered 2023-11-11 – 2023-11-17 (×13): 300 mg via ORAL
  Filled 2023-11-11 (×13): qty 1

## 2023-11-11 MED ORDER — NALOXONE HCL 4 MG/0.1ML NA LIQD
1.0000 | NASAL | 0 refills | Status: DC
  Filled 2023-11-11: qty 2, 2d supply, fill #0
  Filled 2023-11-24: qty 2, 30d supply, fill #0

## 2023-11-11 MED ORDER — INSULIN GLARGINE-YFGN 100 UNIT/ML ~~LOC~~ SOLN
20.0000 [IU] | Freq: Every day | SUBCUTANEOUS | Status: DC
  Administered 2023-11-11 – 2023-11-12 (×2): 20 [IU] via SUBCUTANEOUS
  Filled 2023-11-11 (×4): qty 0.2

## 2023-11-11 MED ORDER — POTASSIUM CHLORIDE 10 MEQ/100ML IV SOLN: 10.0000 meq | INTRAVENOUS | Status: DC

## 2023-11-11 MED ORDER — SODIUM CHLORIDE 0.9 % IV BOLUS
500.0000 mL | Freq: Once | INTRAVENOUS | Status: AC
  Administered 2023-11-11: 500 mL via INTRAVENOUS

## 2023-11-11 MED ORDER — METOPROLOL TARTRATE 5 MG/5ML IV SOLN
2.5000 mg | INTRAVENOUS | Status: AC | PRN
  Administered 2023-11-11 (×2): 2.5 mg via INTRAVENOUS
  Filled 2023-11-11 (×2): qty 5

## 2023-11-11 MED ORDER — HYDROMORPHONE HCL 1 MG/ML IJ SOLN
0.5000 mg | INTRAMUSCULAR | Status: DC | PRN
  Administered 2023-11-11 – 2023-11-17 (×24): 0.5 mg via INTRAVENOUS
  Filled 2023-11-11 (×25): qty 0.5

## 2023-11-11 MED ORDER — SODIUM CHLORIDE 0.9 % IV SOLN
1.0000 g | Freq: Once | INTRAVENOUS | Status: AC
  Administered 2023-11-11: 1 g via INTRAVENOUS
  Filled 2023-11-11: qty 10

## 2023-11-11 MED ORDER — INSULIN ASPART 100 UNIT/ML IJ SOLN
3.0000 [IU] | Freq: Three times a day (TID) | INTRAMUSCULAR | Status: DC
  Administered 2023-11-13 – 2023-11-17 (×3): 3 [IU] via SUBCUTANEOUS

## 2023-11-11 MED ORDER — MUPIROCIN 2 % EX OINT
1.0000 | TOPICAL_OINTMENT | Freq: Two times a day (BID) | CUTANEOUS | Status: AC
  Administered 2023-11-11 – 2023-11-15 (×10): 1 via NASAL
  Filled 2023-11-11 (×2): qty 22

## 2023-11-11 MED ORDER — PERFLUTREN LIPID MICROSPHERE
1.0000 mL | INTRAVENOUS | Status: AC | PRN
  Administered 2023-11-11: 2 mL via INTRAVENOUS

## 2023-11-11 MED ORDER — METOPROLOL TARTRATE 25 MG PO TABS: 25.0000 mg | ORAL_TABLET | Freq: Two times a day (BID) | ORAL | Status: DC

## 2023-11-11 MED ORDER — JUVEN PO PACK
1.0000 | PACK | Freq: Two times a day (BID) | ORAL | Status: DC
  Administered 2023-11-12: 1 via ORAL
  Filled 2023-11-11 (×5): qty 1

## 2023-11-11 MED ORDER — POTASSIUM CHLORIDE 10 MEQ/100ML IV SOLN
10.0000 meq | INTRAVENOUS | Status: AC
  Administered 2023-11-11 (×4): 10 meq via INTRAVENOUS
  Filled 2023-11-11 (×4): qty 100

## 2023-11-11 MED ORDER — LACTATED RINGERS IV SOLN: INTRAVENOUS | Status: DC

## 2023-11-11 MED ORDER — GABAPENTIN 300 MG PO CAPS: 300.0000 mg | ORAL_CAPSULE | Freq: Two times a day (BID) | ORAL | Status: DC

## 2023-11-11 MED ORDER — METOPROLOL TARTRATE 50 MG PO TABS
50.0000 mg | ORAL_TABLET | Freq: Two times a day (BID) | ORAL | Status: DC
  Administered 2023-11-11 – 2023-11-17 (×13): 50 mg via ORAL
  Filled 2023-11-11: qty 2
  Filled 2023-11-11 (×5): qty 1
  Filled 2023-11-11: qty 2
  Filled 2023-11-11 (×6): qty 1

## 2023-11-11 MED ORDER — GADOBUTROL 1 MMOL/ML IV SOLN
10.0000 mL | Freq: Once | INTRAVENOUS | Status: AC | PRN
  Administered 2023-11-11: 10 mL via INTRAVENOUS

## 2023-11-11 MED ORDER — INSULIN ASPART 100 UNIT/ML IJ SOLN
0.0000 [IU] | Freq: Three times a day (TID) | INTRAMUSCULAR | Status: DC
  Administered 2023-11-11: 3 [IU] via SUBCUTANEOUS
  Administered 2023-11-11: 5 [IU] via SUBCUTANEOUS
  Administered 2023-11-12: 8 [IU] via SUBCUTANEOUS
  Administered 2023-11-12: 11 [IU] via SUBCUTANEOUS
  Administered 2023-11-13: 5 [IU] via SUBCUTANEOUS
  Administered 2023-11-13: 8 [IU] via SUBCUTANEOUS
  Administered 2023-11-13: 3 [IU] via SUBCUTANEOUS
  Administered 2023-11-14: 5 [IU] via SUBCUTANEOUS
  Administered 2023-11-14: 3 [IU] via SUBCUTANEOUS
  Administered 2023-11-14: 5 [IU] via SUBCUTANEOUS
  Administered 2023-11-15: 3 [IU] via SUBCUTANEOUS
  Administered 2023-11-15: 2 [IU] via SUBCUTANEOUS
  Administered 2023-11-15: 3 [IU] via SUBCUTANEOUS
  Administered 2023-11-16 (×3): 5 [IU] via SUBCUTANEOUS
  Administered 2023-11-17 (×2): 2 [IU] via SUBCUTANEOUS

## 2023-11-11 MED ORDER — VANCOMYCIN HCL 1750 MG/350ML IV SOLN
1750.0000 mg | INTRAVENOUS | Status: DC
  Administered 2023-11-12 – 2023-11-14 (×3): 1750 mg via INTRAVENOUS
  Filled 2023-11-11 (×3): qty 350

## 2023-11-11 MED ORDER — ADULT MULTIVITAMIN W/MINERALS CH
1.0000 | ORAL_TABLET | Freq: Every day | ORAL | Status: DC
  Administered 2023-11-11 – 2023-11-17 (×7): 1 via ORAL
  Filled 2023-11-11 (×7): qty 1

## 2023-11-11 MED ORDER — SODIUM CHLORIDE 0.9 % IV SOLN
1.0000 g | INTRAVENOUS | Status: DC
  Administered 2023-11-12: 1 g via INTRAVENOUS
  Filled 2023-11-11: qty 10

## 2023-11-11 NOTE — Sepsis Progress Note (Signed)
 Following for sepsis monitoring ?

## 2023-11-11 NOTE — Progress Notes (Signed)
 Patient has left-sided foot diabetic ulcer. -Consulting wound care for evaluation.   Media Information   Document Information  Photos  4x3x1cm stage 2 diabetic foot ulcer  11/11/2023 04:00  Attached To:  Hospital Encounter on 11/10/23  Source Information  Kathlene November, RN  [facility]  Document History

## 2023-11-11 NOTE — TOC Initial Note (Signed)
 Transition of Care [facility]) - Initial/Assessment Note    Patient Details  Name: Taylor Hughes MRN: [id number] Date of Birth: November 08, 1970  [facility]) CM/SW Contact:    Marliss Coots, LCSW Phone Number: 11/11/2023, 11:35 AM  Clinical Narrative:                  11:35 AM CSW introduced self and role to patient at bedside. CSW offered resources/support. Patient informed CSW that he receives disability and food stamps but is interested ing affordable housing, financial assistance resources for rent/utilities, as well as a list of local food pantries. Patient stated he is from home with significant other (first floor apartment). Patient stated significant other could provide transportation at discharge and that they use private vehicle for transportation to/from medical/non-medical appointments. CSW provided patient with [facility] [employer] and [employer] affordable housing packet. Patient requested CSW to email resources ([email or web address]). CSW emailed resources and added resources to AVS.  Expected Discharge Plan: Home/Self Care Barriers to Discharge: Continued Medical Work up   Patient Goals and CMS Choice Patient states their goals for this hospitalization and ongoing recovery are:: to return home          Expected Discharge Plan and Services       Living arrangements for the past 2 months: Apartment                                      Prior Living Arrangements/Services Living arrangements for the past 2 months: Apartment Lives with:: Significant Other Patient language and need for interpreter reviewed:: Yes Do you feel safe going back to the place where you live?: Yes            Criminal Activity/Legal Involvement Pertinent to Current Situation/Hospitalization: No - Comment as needed  Activities of Daily Living      Permission Sought/Granted Permission sought to share information with : Other (comment)  (Significant Other) Permission granted to share information with : No (Contact information on chart)  Share Information with NAME: Demetra Shiner     Permission granted to share info w Relationship: Significant Other  Permission granted to share info w Contact Information: [phone]  Emotional Assessment Appearance:: Appears stated age Attitude/Demeanor/Rapport: Engaged Affect (typically observed): Accepting, Adaptable, Pleasant, Stable, Calm, Appropriate Orientation: : Oriented to Self, Oriented to Place, Oriented to Situation, Oriented to  Time Alcohol / Substance Use: Not Applicable Psych Involvement: No (comment)  Admission diagnosis:  DKA, type 2 (HCC) [E11.10] Diabetic ketoacidosis without coma associated with type 2 diabetes mellitus (HCC) [E11.10] Patient Active Problem List   Diagnosis Date Noted   Acute pyelonephritis 11/11/2023   DKA, type 2 (HCC) 11/10/2023   Flank pain 11/10/2023   Left thigh pain 11/10/2023   Neck pain 11/10/2023   Elevated troponin 11/10/2023   Lactic acidosis 11/10/2023   Acute cystitis with hematuria 06/25/2023   History of transmetatarsal amputation of right foot (HCC) 04/27/2023   Lymphedema 03/24/2023   Right ventricular enlargement 03/24/2023   Diabetic foot infection (HCC) 03/23/2023   Osteomyelitis (HCC) 03/22/2023   Atrial fibrillation, chronic (HCC) 03/22/2023   HLD (hyperlipidemia) 03/22/2023   Gastroparesis 03/22/2023   Depression 09/08/2022   GERD (gastroesophageal reflux disease) 03/03/2022   Diabetes mellitus (HCC) 09/19/2021   PAF (paroxysmal atrial fibrillation) (HCC)    Anemia    Venous insufficiency (chronic) (peripheral) 03/15/2019   Edema of both lower  extremities due to peripheral venous insufficiency 03/15/2019   Essential hypertension 11/23/2018   AKI (acute kidney injury) (HCC) 11/23/2018   Morbid obesity with BMI of 45.0-49.9, adult (HCC) 11/23/2018   Type 2 diabetes mellitus with other specified complication (HCC)  11/23/2018   Diabetic infection of left foot (HCC)    DKA (diabetic ketoacidosis) (HCC) 07/30/2018   DMII (diabetes mellitus, type 2) (HCC) 07/28/2018   PCP:  Hoy Register, MD Pharmacy:   [facility] [address], [address] [place] [address] Phone: [phone] Fax: [phone]  [id number] [place], [place] - [address] AT [facility] [address] [facility] [address] [place] [address] Phone: [phone] Fax: [phone]  [facility] [address] [place] [address] Phone: [phone] Fax: [phone]  [facility] [address] [place]), [place] - [address] [place] [address] Phone: [phone] Fax: [phone]  [facility] [address] [place] [address] Phone: [phone] Fax: [phone]  [facility] - [place], [place] - [address] [place] [address] Phone: [phone] Fax: [phone]     Social Drivers of Health (SDOH) Social History: SDOH Screenings   Food Insecurity: Food Insecurity Present (06/24/2023)  Housing: Low Risk  (06/24/2023)  Transportation Needs: No Transportation Needs (06/24/2023)  Utilities: Not At Risk (03/23/2023)  Depression (PHQ2-9): Medium Risk (06/25/2023)  Financial Resource Strain: Medium Risk (06/24/2023)  Physical Activity: Unknown (06/24/2023)  Social Connections: Socially Isolated (06/24/2023)  Stress: No Stress Concern Present (06/24/2023)  Tobacco Use: Low Risk  (11/10/2023)   SDOH Interventions:     Readmission Risk Interventions    03/24/2023    4:56 PM 08/15/2022    1:23 PM  Readmission Risk Prevention Plan  Transportation Screening Complete Complete  PCP or Specialist Appt within 5-7 Days  Complete  PCP or Specialist Appt within 3-5 Days Complete    Home Care Screening  Complete  Medication Review (RN CM)  Complete  HRI or Home Care Consult Complete   Social Work Consult for Recovery Care Planning/Counseling Complete   Palliative Care Screening Not Applicable   Medication Review Oceanographer) Referral to Pharmacy

## 2023-11-11 NOTE — Progress Notes (Addendum)
 Sepsis in the setting of pyelonephritis In the ED patient has been resuscitated with 4.7 L of LR and currently on maintenance fluid.  However blood culture has been not drawn neither in the ED or during admission. -Obtaining blood culture now. -Continue IV ceftriaxone 1 g daily. - Continue trend lactic acid and if lactic acid is still high will treat with more boluses.  Currently on maintenance fluid either NS or D5 LR based on the blood glucose level as already treating for for DKA.  Update, repeat lactic acid is 2.9.  Initial lactic acid was 3.7 which has been improved only to 2.9 after 4.7 L LR bolus.  It is seems like that patient has underlying DKA which is contributing to worsening lactic acidosis as well. -Will give 1 L of LR bolus which would be total 5.7 L.   Tereasa Coop, MD [facility] 11/11/2023, 2:54 AM

## 2023-11-11 NOTE — Inpatient Diabetes Management (Signed)
 Inpatient Diabetes Program Recommendations  AACE/ADA: New Consensus Statement on Inpatient Glycemic Control (2015)  Target Ranges:  Prepandial:   less than 140 mg/dL      Peak postprandial:   less than 180 mg/dL (1-2 hours)      Critically ill patients:  140 - 180 mg/dL   Lab Results  Component Value Date   GLUCAP 140 (H) 11/11/2023   HGBA1C 9.1 (H) 11/11/2023    Diabetes history: DM2 Outpatient Diabetes medications: Lantus 10 units daily, Humalog 0-12 units tid Current orders for Inpatient glycemic control: Semglee 20 units daily  Inpatient Diabetes Program Recommendations:   Just transitioned from IV insulin to Semglee 20 units daily. Please consider: -Add Novolog 0-9 units tid, 0-5 units hs -Add Novolog 3 units tid meal coverage when postprandial CBGs >180  Thank you, Darel Hong E. Kailie Polus, RN, MSN, CDCES  Diabetes Coordinator Inpatient Glycemic Control Team Team Pager [phone] (8am-5pm) 11/11/2023 10:00 AM

## 2023-11-11 NOTE — Consult Note (Addendum)
 WOC Nurse Consult Note: patient is a diabetic with a history of R foot transmetatarsal amputation by Dr. [name]  Reason for Consult: L foot diabetic ulcer  Wound type: full thickness r/t neuropathy  Pressure Injury POA: NA  Measurement: see nursing flowsheet  Wound bed: largely red moist  Drainage (amount, consistency, odor) see nursing flowsheet  Periwound: heavy callusing  Dressing procedure/placement/frequency:  Cleanse L foot wound with Vashe wound cleanser [place] [phone]), do not rinse and allow to air dry.  Pack wound with a piece of silver hydrofiber (Aquacel [place] [phone]) cut to fit wound bed.  Cover with dry gauze and Kerlix roll gauze or silicone foam whichever is preferred by patient.    Patient should follow with Dr. Burna Sis office as outpatient for ongoing management of this wound.    POC discussed with bedside nurse. WOC team will not follow. Re-consult if further needs arise.   Thank you,    Priscella Mann MSN, RN-BC, [employer] [phone]

## 2023-11-11 NOTE — ED Notes (Signed)
 ED TO INPATIENT HANDOFF REPORT  ED Nurse Name and Phone #: Marcie Bal RN, [phone]  S Name/Age/Gender Taylor Hughes 53 y.o. male Room/Bed: 003C/003C  Code Status   Code Status: Full Code  Home/SNF/Other Home Patient oriented to: self, place, time, and situation Is this baseline? Yes   Triage Complete: Triage complete  Chief Complaint DKA, type 2 (HCC) [E11.10]  Triage Note Pt sick at home with CBG that only reading high   Allergies No Known Allergies  Level of Care/Admitting Diagnosis ED Disposition     ED Disposition  Admit   Condition  --   Comment  Hospital Area: [facility] [[id number]]  Level of Care: Progressive [102]  Admit to Progressive based on following criteria: GI, ENDOCRINE disease patients with GI bleeding, acute liver failure or pancreatitis, stable with diabetic ketoacidosis or thyrotoxicosis (hypothyroid) state.  May place patient in observation at [facility] or [facility] if equivalent level of care is available:: No  Covid Evaluation: Asymptomatic - no recent exposure (last 10 days) testing not required  Diagnosis: DKA, type 2 [facility]) [[id number]]  Admitting Physician: Therisa Doyne [[id number]]  Attending Physician: Therisa Doyne [[id number]]          B Medical/Surgery History Past Medical History:  Diagnosis Date   Atrial fibrillation (HCC)    Diabetes mellitus without complication (HCC)    Type 2   GERD (gastroesophageal reflux disease)    Hyperlipidemia    Hypertension    Morbid (severe) obesity due to excess calories (HCC) 08/07/2023   bmi 44.63   Wears glasses    Wound, open, foot    left diabetic    Past Surgical History:  Procedure Laterality Date   AMPUTATION Left 08/20/2018   Procedure: LEFT FOOT IRRIGATON AND DEBRIDEMENT, 5TH RAY AMPUTATION;  Surgeon: Nadara Mustard, MD;  Location: [facility] OR;  Service: Orthopedics;  Laterality: Left;   AMPUTATION Right 03/25/2023   Procedure: RIGHT TRANSMETATARSAL AMPUTATION;   Surgeon: Nadara Mustard, MD;  Location: [facility] OR;  Service: Orthopedics;  Laterality: Right;   APPLICATION OF A-CELL OF CHEST/ABDOMEN N/A 05/30/2019   Procedure: Application Of A-Cell Of Chest;  Surgeon: Corliss Skains, MD;  Location: [facility] OR;  Service: Cardiothoracic;  Laterality: N/A;   APPLICATION OF WOUND VAC N/A 05/30/2019   Procedure: Application Of Wound Vac;  Surgeon: Corliss Skains, MD;  Location: [facility] OR;  Service: Cardiothoracic;  Laterality: N/A;   FINGER SURGERY Right 2016   I&D  small finger   I & D EXTREMITY Left 07/30/2018   Procedure: IRRIGATION AND DEBRIDEMENT LEFT FOOT WITH POSSIBLE AMPUTATION OF FIFTH TOE;  Surgeon: Kathryne Hitch, MD;  Location: [facility] ORS;  Service: Orthopedics;  Laterality: Left;   INCISION AND DRAINAGE ABSCESS Left 01/19/2019   Procedure: INCISION AND DRAINAGE ABSCESS upper chest;  Surgeon: Christia Reading, MD;  Location: [facility] OR;  Service: ENT;  Laterality: Left;   IRRIGATION AND DEBRIDEMENT STERNOCLAVICULAR JOINT-STERNUM AND RIBS N/A 05/30/2019   Procedure: IRRIGATION AND DEBRIDEMENT OF STERNOCLAVICULAR JOINT-STERNUM AND RIBS ;  Surgeon: Corliss Skains, MD;  Location: [facility] OR;  Service: Cardiothoracic;  Laterality: N/A;   MINOR IRRIGATION AND DEBRIDEMENT OF WOUND N/A 11/22/2018   Procedure: INCISION AND DRAINAGE OF NECK ABSCESS;  Surgeon: Christia Reading, MD;  Location: [facility] ORS;  Service: ENT;  Laterality: N/A;     A IV Location/Drains/Wounds Patient Lines/Drains/Airways Status     Active Line/Drains/Airways     Name Placement date Placement time Site Days   Peripheral  IV 11/10/23 18 G Left Antecubital 11/10/23  1607  Antecubital  1   Negative Pressure Wound Therapy Foot Anterior;Right 03/26/23  2000  --  230   Wound / Incision (Open or Dehisced) 03/23/23 Diabetic ulcer Anterior;Right non healing wound 03/23/23  0045  --  233            Intake/Output Last 24 hours  Intake/Output Summary (Last 24 hours) at 11/11/2023 0346 Last data  filed at 11/10/2023 1731 Gross per 24 hour  Intake 1000 ml  Output --  Net 1000 ml    Labs/Imaging Results for orders placed or performed during the hospital encounter of 11/10/23 (from the past 48 hours)  CBG monitoring, ED     Status: Abnormal   Collection Time: 11/10/23  3:18 PM  Result Value Ref Range   Glucose-Capillary 562 (HH) 70 - 99 mg/dL    Comment: Glucose reference range applies only to samples taken after fasting for at least 8 hours.   Comment 1 Notify RN   Urinalysis, Routine w reflex microscopic -Urine, Clean Catch     Status: Abnormal   Collection Time: 11/10/23  3:51 PM  Result Value Ref Range   Color, Urine YELLOW YELLOW   APPearance CLEAR CLEAR   Specific Gravity, Urine 1.018 1.005 - 1.030   pH 5.0 5.0 - 8.0   Glucose, UA >=500 (A) NEGATIVE mg/dL   Hgb urine dipstick MODERATE (A) NEGATIVE   Bilirubin Urine NEGATIVE NEGATIVE   Ketones, ur 80 (A) NEGATIVE mg/dL   Protein, ur 784 (A) NEGATIVE mg/dL   Nitrite NEGATIVE NEGATIVE   Leukocytes,Ua NEGATIVE NEGATIVE   RBC / HPF 0-5 0 - 5 RBC/hpf   WBC, UA 0-5 0 - 5 WBC/hpf   Bacteria, UA NONE SEEN NONE SEEN   Squamous Epithelial / HPF 0-5 0 - 5 /HPF    Comment: Performed at [facility], [address], [place], [place] [address]  Resp panel by RT-PCR (RSV, Flu A&B, Covid) Urine, Clean Catch     Status: None   Collection Time: 11/10/23  3:55 PM   Specimen: Urine, Clean Catch; Nasal Swab  Result Value Ref Range   SARS Coronavirus 2 by RT PCR NEGATIVE NEGATIVE   Influenza A by PCR NEGATIVE NEGATIVE   Influenza B by PCR NEGATIVE NEGATIVE    Comment: (NOTE) The Xpert Xpress SARS-CoV-2/FLU/RSV plus assay is intended as an aid in the diagnosis of influenza from Nasopharyngeal swab specimens and should not be used as a sole basis for treatment. Nasal washings and aspirates are unacceptable for Xpert Xpress SARS-CoV-2/FLU/RSV testing.  Fact Sheet for Patients: BloggerCourse.com  Fact  Sheet for Healthcare Providers: SeriousBroker.it  This test is not yet approved or cleared by the [place] FDA and has been authorized for detection and/or diagnosis of SARS-CoV-2 by FDA under an Emergency Use Authorization (EUA). This EUA will remain in effect (meaning this test can be used) for the duration of the COVID-19 declaration under Section 564(b)(1) of the Act, 21 U.S.C. section 360bbb-3(b)(1), unless the authorization is terminated or revoked.     Resp Syncytial Virus by PCR NEGATIVE NEGATIVE    Comment: (NOTE) Fact Sheet for Patients: BloggerCourse.com  Fact Sheet for Healthcare Providers: SeriousBroker.it  This test is not yet approved or cleared by the [place] FDA and has been authorized for detection and/or diagnosis of SARS-CoV-2 by FDA under an Emergency Use Authorization (EUA). This EUA will remain in effect (meaning this test can be used) for  the duration of the COVID-19 declaration under Section 564(b)(1) of the Act, 21 U.S.C. section 360bbb-3(b)(1), unless the authorization is terminated or revoked.  Performed at [facility], [address] [place], [place] [address]   I-stat chem 8, ED (not at [facility], [facility] or [facility])     Status: Abnormal   Collection Time: 11/10/23  4:07 PM  Result Value Ref Range   Sodium 132 (L) 135 - 145 mmol/L   Potassium 5.0 3.5 - 5.1 mmol/L   Chloride 108 98 - 111 mmol/L   BUN 37 (H) 6 - 20 mg/dL   Creatinine, Ser 1.32 (H) 0.61 - 1.24 mg/dL   Glucose, Bld 440 (HH) 70 - 99 mg/dL    Comment: Glucose reference range applies only to samples taken after fasting for at least 8 hours.   Calcium, Ion 1.00 (L) 1.15 - 1.40 mmol/L   TCO2 8 (L) 22 - 32 mmol/L   Hemoglobin 13.9 13.0 - 17.0 g/dL   HCT 10.2 72.5 - 36.6 %   Comment NOTIFIED PHYSICIAN   CBG monitoring, ED     Status: Abnormal   Collection Time: 11/10/23  4:41 PM  Result Value Ref Range    Glucose-Capillary 598 (HH) 70 - 99 mg/dL    Comment: Glucose reference range applies only to samples taken after fasting for at least 8 hours.   Comment 1 Notify RN    Comment 2 Document in Chart   CBG monitoring, ED     Status: Abnormal   Collection Time: 11/10/23  5:11 PM  Result Value Ref Range   Glucose-Capillary 570 (HH) 70 - 99 mg/dL    Comment: Glucose reference range applies only to samples taken after fasting for at least 8 hours.   Comment 1 Notify RN   I-Stat venous blood gas, ([facility] ED, [facility], [facility])     Status: Abnormal   Collection Time: 11/10/23  5:17 PM  Result Value Ref Range   pH, Ven 7.168 (LL) 7.25 - 7.43   pCO2, Ven 19.0 (LL) 44 - 60 mmHg   pO2, Ven 53 (H) 32 - 45 mmHg   Bicarbonate 6.9 (L) 20.0 - 28.0 mmol/L   TCO2 7 (L) 22 - 32 mmol/L   O2 Saturation 79 %   Acid-base deficit 20.0 (H) 0.0 - 2.0 mmol/L   Sodium 134 (L) 135 - 145 mmol/L   Potassium 4.6 3.5 - 5.1 mmol/L   Calcium, Ion 1.13 (L) 1.15 - 1.40 mmol/L   HCT 38.0 (L) 39.0 - 52.0 %   Hemoglobin 12.9 (L) 13.0 - 17.0 g/dL   Sample type VENOUS    Comment NOTIFIED PHYSICIAN   CBG monitoring, ED     Status: Abnormal   Collection Time: 11/10/23  5:50 PM  Result Value Ref Range   Glucose-Capillary 518 (HH) 70 - 99 mg/dL    Comment: Glucose reference range applies only to samples taken after fasting for at least 8 hours.   Comment 1 Notify RN    Comment 2 Document in Chart   CBG monitoring, ED     Status: Abnormal   Collection Time: 11/10/23  6:31 PM  Result Value Ref Range   Glucose-Capillary 490 (H) 70 - 99 mg/dL    Comment: Glucose reference range applies only to samples taken after fasting for at least 8 hours.  CBG monitoring, ED     Status: Abnormal   Collection Time: 11/10/23  7:17 PM  Result Value Ref Range   Glucose-Capillary 459 (H)  70 - 99 mg/dL    Comment: Glucose reference range applies only to samples taken after fasting for at least 8 hours.  CBC with Differential     Status: Abnormal    Collection Time: 11/10/23  7:22 PM  Result Value Ref Range   WBC 18.9 (H) 4.0 - 10.5 K/uL   RBC 4.12 (L) 4.22 - 5.81 MIL/uL   Hemoglobin 11.3 (L) 13.0 - 17.0 g/dL   HCT 16.1 (L) 09.6 - 04.5 %   MCV 86.4 80.0 - 100.0 fL   MCH 27.4 26.0 - 34.0 pg   MCHC 31.7 30.0 - 36.0 g/dL   RDW 40.9 (H) 81.1 - 91.4 %   Platelets 314 150 - 400 K/uL   nRBC 0.0 0.0 - 0.2 %   Neutrophils Relative % 92 %   Neutro Abs 17.4 (H) 1.7 - 7.7 K/uL   Lymphocytes Relative 4 %   Lymphs Abs 0.7 0.7 - 4.0 K/uL   Monocytes Relative 4 %   Monocytes Absolute 0.7 0.1 - 1.0 K/uL   Eosinophils Relative 0 %   Eosinophils Absolute 0.0 0.0 - 0.5 K/uL   Basophils Relative 0 %   Basophils Absolute 0.0 0.0 - 0.1 K/uL   Immature Granulocytes 0 %   Abs Immature Granulocytes 0.07 0.00 - 0.07 K/uL    Comment: Performed at [facility], [address] [place], [place] [address]  Comprehensive metabolic panel     Status: Abnormal   Collection Time: 11/10/23  7:22 PM  Result Value Ref Range   Sodium 137 135 - 145 mmol/L   Potassium 3.7 3.5 - 5.1 mmol/L   Chloride 107 98 - 111 mmol/L   CO2 10 (L) 22 - 32 mmol/L   Glucose, Bld 518 (HH) 70 - 99 mg/dL    Comment: CRITICAL RESULT CALLED TO, READ BACK BY AND VERIFIED WITH K. Ross RN , @2011  , 11/10/23, [place],T. Glucose reference range applies only to samples taken after fasting for at least 8 hours.    BUN 35 (H) 6 - 20 mg/dL   Creatinine, Ser 6.21 (H) 0.61 - 1.24 mg/dL   Calcium 8.5 (L) 8.9 - 10.3 mg/dL   Total Protein 7.0 6.5 - 8.1 g/dL   Albumin 2.0 (L) 3.5 - 5.0 g/dL   AST 15 15 - 41 U/L   ALT 12 0 - 44 U/L   Alkaline Phosphatase 102 38 - 126 U/L   Total Bilirubin 1.6 (H) 0.0 - 1.2 mg/dL   GFR, Estimated 38 (L) >60 mL/min    Comment: (NOTE) Calculated using the CKD-EPI Creatinine Equation (2021)    Anion gap 20 (H) 5 - 15    Comment: Performed at [facility], [address] [place], [place] [address]  CK     Status: None   Collection Time: 11/10/23  7:22  PM  Result Value Ref Range   Total CK 88 49 - 397 U/L    Comment: Performed at [facility], [address] [place], [place] [address]  Troponin I (High Sensitivity)     Status: Abnormal   Collection Time: 11/10/23  7:22 PM  Result Value Ref Range   Troponin I (High Sensitivity) 23 (H) <18 ng/L    Comment: (NOTE) Elevated high sensitivity troponin I (hsTnI) values and significant  changes across serial measurements may suggest ACS but many other  chronic and acute conditions are known to elevate hsTnI results.  Refer to the "Links" section for chest  pain algorithms and additional  guidance. Performed at [facility], [address] [place], [place] [address]   Brain natriuretic peptide     Status: Abnormal   Collection Time: 11/10/23  7:22 PM  Result Value Ref Range   B Natriuretic Peptide 585.6 (H) 0.0 - 100.0 pg/mL    Comment: Performed at [facility], [address], [place], [place] [address]  CBG monitoring, ED     Status: Abnormal   Collection Time: 11/10/23  8:05 PM  Result Value Ref Range   Glucose-Capillary 430 (H) 70 - 99 mg/dL    Comment: Glucose reference range applies only to samples taken after fasting for at least 8 hours.  Beta-hydroxybutyric acid     Status: Abnormal   Collection Time: 11/10/23  8:34 PM  Result Value Ref Range   Beta-Hydroxybutyric Acid 5.65 (H) 0.05 - 0.27 mmol/L    Comment: RESULT CONFIRMED BY MANUAL DILUTION Performed at [facility], [address], [place], [place] [address]   Troponin I (High Sensitivity)     Status: Abnormal   Collection Time: 11/10/23  8:34 PM  Result Value Ref Range   Troponin I (High Sensitivity) 22 (H) <18 ng/L    Comment: (NOTE) Elevated high sensitivity troponin I (hsTnI) values and significant  changes across serial measurements may suggest ACS but many other  chronic and acute conditions are known to elevate hsTnI results.  Refer to the "Links" section for chest pain algorithms and  additional  guidance. Performed at [facility], [address], [place], [place] [address]   CBG monitoring, ED     Status: Abnormal   Collection Time: 11/10/23  8:42 PM  Result Value Ref Range   Glucose-Capillary 419 (H) 70 - 99 mg/dL    Comment: Glucose reference range applies only to samples taken after fasting for at least 8 hours.  CBG monitoring, ED     Status: Abnormal   Collection Time: 11/10/23  9:12 PM  Result Value Ref Range   Glucose-Capillary 442 (H) 70 - 99 mg/dL    Comment: Glucose reference range applies only to samples taken after fasting for at least 8 hours.  Lactic acid, plasma     Status: Abnormal   Collection Time: 11/10/23  9:37 PM  Result Value Ref Range   Lactic Acid, Venous 3.7 (HH) 0.5 - 1.9 mmol/L    Comment: CRITICAL RESULT CALLED TO, READ BACK BY AND VERIFIED WITH Danelle Berry, RN @2220  04.01.25 AALTOM Performed at [facility], [address], [place], [place] [address]   Basic metabolic panel     Status: Abnormal   Collection Time: 11/10/23  9:37 PM  Result Value Ref Range   Sodium 136 135 - 145 mmol/L   Potassium 3.6 3.5 - 5.1 mmol/L   Chloride 105 98 - 111 mmol/L   CO2 15 (L) 22 - 32 mmol/L   Glucose, Bld 444 (H) 70 - 99 mg/dL    Comment: Glucose reference range applies only to samples taken after fasting for at least 8 hours.   BUN 37 (H) 6 - 20 mg/dL   Creatinine, Ser 1.30 (H) 0.61 - 1.24 mg/dL   Calcium 8.6 (L) 8.9 - 10.3 mg/dL   GFR, Estimated 39 (L) >60 mL/min    Comment: (NOTE) Calculated using the CKD-EPI Creatinine Equation (2021)    Anion gap 16 (H) 5 - 15    Comment: Performed at [facility], [address] [place], [place] [address]  CBG monitoring, ED     Status: Abnormal   Collection Time: 11/10/23  9:41 PM  Result Value Ref Range   Glucose-Capillary 377 (H) 70 - 99 mg/dL    Comment: Glucose reference range applies only to samples taken after fasting for at least 8 hours.  CBG monitoring, ED     Status:  Abnormal   Collection Time: 11/10/23 10:41 PM  Result Value Ref Range   Glucose-Capillary 345 (H) 70 - 99 mg/dL    Comment: Glucose reference range applies only to samples taken after fasting for at least 8 hours.  CBG monitoring, ED     Status: Abnormal   Collection Time: 11/10/23 11:46 PM  Result Value Ref Range   Glucose-Capillary 280 (H) 70 - 99 mg/dL    Comment: Glucose reference range applies only to samples taken after fasting for at least 8 hours.  Lactic acid, plasma     Status: Abnormal   Collection Time: 11/11/23 12:19 AM  Result Value Ref Range   Lactic Acid, Venous 3.4 (HH) 0.5 - 1.9 mmol/L    Comment: CRITICAL VALUE NOTED. VALUE IS CONSISTENT WITH PREVIOUSLY REPORTED/CALLED VALUE Performed at [facility], [address], [place], [place] [address]   CBG monitoring, ED     Status: Abnormal   Collection Time: 11/11/23 12:49 AM  Result Value Ref Range   Glucose-Capillary 235 (H) 70 - 99 mg/dL    Comment: Glucose reference range applies only to samples taken after fasting for at least 8 hours.  CBG monitoring, ED     Status: Abnormal   Collection Time: 11/11/23  1:58 AM  Result Value Ref Range   Glucose-Capillary 214 (H) 70 - 99 mg/dL    Comment: Glucose reference range applies only to samples taken after fasting for at least 8 hours.  Procalcitonin     Status: None   Collection Time: 11/11/23  2:22 AM  Result Value Ref Range   Procalcitonin 5.15 ng/mL    Comment:        Interpretation: PCT > 2 ng/mL: Systemic infection (sepsis) is likely, unless other causes are known. (NOTE)       Sepsis PCT Algorithm           Lower Respiratory Tract                                      Infection PCT Algorithm    ----------------------------     ----------------------------         PCT < 0.25 ng/mL                PCT < 0.10 ng/mL          Strongly encourage             Strongly discourage   discontinuation of antibiotics    initiation of antibiotics     ----------------------------     -----------------------------       PCT 0.25 - 0.50 ng/mL            PCT 0.10 - 0.25 ng/mL               OR       >80% decrease in PCT            Discourage initiation of  antibiotics      Encourage discontinuation           of antibiotics    ----------------------------     -----------------------------         PCT >= 0.50 ng/mL              PCT 0.26 - 0.50 ng/mL               AND       <80% decrease in PCT              Encourage initiation of                                             antibiotics       Encourage continuation           of antibiotics    ----------------------------     -----------------------------        PCT >= 0.50 ng/mL                  PCT > 0.50 ng/mL               AND         increase in PCT                  Strongly encourage                                      initiation of antibiotics    Strongly encourage escalation           of antibiotics                                     -----------------------------                                           PCT <= 0.25 ng/mL                                                 OR                                        > 80% decrease in PCT                                      Discontinue / Do not initiate                                             antibiotics  Performed at [facility], [address] [place], [place] [address]   Osmolality     Status: Abnormal   Collection Time: 11/11/23  2:22 AM  Result Value Ref Range   Osmolality 310 (H) 275 - 295 mOsm/kg    Comment: Performed at [facility], [address], [place], [place] [address]  Basic metabolic panel     Status: Abnormal   Collection Time: 11/11/23  2:22 AM  Result Value Ref Range   Sodium 138 135 - 145 mmol/L   Potassium 3.6 3.5 - 5.1 mmol/L   Chloride 109 98 - 111 mmol/L   CO2 20 (L) 22 - 32 mmol/L   Glucose, Bld 161 (H) 70 - 99 mg/dL    Comment: Glucose  reference range applies only to samples taken after fasting for at least 8 hours.   BUN 33 (H) 6 - 20 mg/dL   Creatinine, Ser 1.91 (H) 0.61 - 1.24 mg/dL   Calcium 8.2 (L) 8.9 - 10.3 mg/dL   GFR, Estimated 59 (L) >60 mL/min    Comment: (NOTE) Calculated using the CKD-EPI Creatinine Equation (2021)    Anion gap 9 5 - 15    Comment: Performed at [facility], [address] [place], [place] [address]  Lactic acid, plasma     Status: Abnormal   Collection Time: 11/11/23  2:22 AM  Result Value Ref Range   Lactic Acid, Venous 2.9 (HH) 0.5 - 1.9 mmol/L    Comment: CRITICAL VALUE NOTED. VALUE IS CONSISTENT WITH PREVIOUSLY REPORTED/CALLED VALUE Performed at [facility], [address], [place], [place] [address]   Osmolality, urine     Status: None   Collection Time: 11/11/23  2:34 AM  Result Value Ref Range   Osmolality, Ur 562 300 - 900 mOsm/kg    Comment: Performed at [facility], [address], [place], [place] [address]  CBG monitoring, ED     Status: Abnormal   Collection Time: 11/11/23  3:04 AM  Result Value Ref Range   Glucose-Capillary 139 (H) 70 - 99 mg/dL    Comment: Glucose reference range applies only to samples taken after fasting for at least 8 hours.  CBC     Status: Abnormal   Collection Time: 11/11/23  3:19 AM  Result Value Ref Range   WBC 15.1 (H) 4.0 - 10.5 K/uL   RBC 3.92 (L) 4.22 - 5.81 MIL/uL   Hemoglobin 10.7 (L) 13.0 - 17.0 g/dL   HCT 84.6 (L) 96.2 - 95.2 %   MCV 83.4 80.0 - 100.0 fL   MCH 27.3 26.0 - 34.0 pg   MCHC 32.7 30.0 - 36.0 g/dL   RDW 84.1 (H) 32.4 - 40.1 %   Platelets 302 150 - 400 K/uL   nRBC 0.0 0.0 - 0.2 %    Comment: Performed at [facility], [address], [place], [place] [address]   CT RENAL STONE STUDY Result Date: 11/10/2023 CLINICAL DATA:  Abdominal/flank pain, stone suspected EXAM: CT ABDOMEN AND PELVIS WITHOUT CONTRAST TECHNIQUE: Multidetector CT imaging of the abdomen and pelvis was performed following the  standard protocol without IV contrast. RADIATION DOSE REDUCTION: This exam was performed according to the departmental dose-optimization program which includes automated exposure control, adjustment of the mA and/or kV according to patient size and/or use of iterative reconstruction technique. COMPARISON:  08/04/2006 FINDINGS: Lower chest: No acute abnormality. Hepatobiliary: No focal hepatic abnormality. Gallbladder unremarkable. Pancreas: No focal abnormality or ductal dilatation. Spleen: No focal abnormality.  Normal size. Adrenals/Urinary Tract: 2.1 cm nodule in the right adrenal gland, not diagnostic of adenoma based on density.  Left adrenal gland normal. Mild bilateral perinephric stranding. No ureteral or renal stones. No hydronephrosis. Urinary bladder unremarkable. Stomach/Bowel: Stomach, large and small bowel grossly unremarkable. Vascular/Lymphatic: Aortic atherosclerosis. No evidence of aneurysm or adenopathy. Reproductive: No visible focal abnormality. Other: No free fluid or free air. Musculoskeletal: No acute bony abnormality. IMPRESSION: Bilateral perinephric stranding without stones or hydronephrosis. This could be related to prior insult or acute inflammation. Recommend clinical correlation to completely exclude pyelonephritis. Aortic atherosclerosis. 2.1 cm indeterminate right adrenal nodule. This could be further evaluated with nonemergent outpatient MRI if felt clinically indicated. Electronically Signed   By: Charlett Nose M.D.   On: 11/10/2023 23:11   DG Chest Portable 1 View Result Date: 11/10/2023 CLINICAL DATA:  DKA.  Shortness of breath. EXAM: PORTABLE CHEST 1 VIEW COMPARISON:  03/25/2023. FINDINGS: Bilateral lung fields are clear. Bilateral costophrenic angles are clear. Note is made of elevated right hemidiaphragm. Stable cardio-mediastinal silhouette. No acute osseous abnormalities. The soft tissues are within normal limits. IMPRESSION: *No active disease. Electronically Signed   By:  Jules Schick M.D.   On: 11/10/2023 17:34    Pending Labs Unresulted Labs (From admission, onward)     Start     Ordered   11/11/23 0500  Lipid panel  Tomorrow morning,   R        11/10/23 2140   11/11/23 0301  Lactic acid, plasma  (Lactic Acid)  Now then every 2 hours,   R (with TIMED occurrences)      11/11/23 0138   11/11/23 0240  Culture, blood (Routine X 2) w Reflex to ID Panel  BLOOD CULTURE X 2,   R (with TIMED occurrences)      11/11/23 0239   11/11/23 0131  Basic metabolic panel  Now then every 4 hours,   R (with TIMED occurrences)      11/11/23 0130   11/11/23 0126  Urine Culture (for pregnant, neutropenic or urologic patients or patients with an indwelling urinary catheter)  (Urine Labs)  Once,   R       Question:  Indication  Answer:  Flank Pain   11/11/23 0125   11/10/23 2245  Sodium, urine, random  Once,   R        11/10/23 2244   11/10/23 2245  Creatinine, urine, random  Once,   R        11/10/23 2244   Signed and Held  HIV Antibody (routine testing w rflx)  (HIV Antibody (Routine testing w reflex) panel)  Once,   R        Signed and Held   Signed and Held  Beta-hydroxybutyric acid  (Diabetes Ketoacidosis (DKA))  Now then every 4 hours,   R      Signed and Held   Signed and Held  Hemoglobin A1c  (Diabetes Ketoacidosis (DKA))  Add-on,   R       Comments: To assess prior glycemic control.    Signed and Held   Signed and Held  Basic metabolic panel  (Diabetes Ketoacidosis (DKA))  STAT Now then every 4 hours ,   R      Signed and Held   Signed and Held  Magnesium  (Diabetes Ketoacidosis (DKA))  Tomorrow morning,   R        Signed and Held   Signed and Held  Phosphorus  (Diabetes Ketoacidosis (DKA))  Tomorrow morning,   R        Signed and Held   Signed and  Held  Blood gas, venous  (Diabetes Ketoacidosis (DKA))  Once,   R        Signed and Held            Vitals/Pain Today's Vitals   11/11/23 0030 11/11/23 0100 11/11/23 0118 11/11/23 0230  BP: 136/79 107/64  127/83 117/65  Pulse: (!) 118 (!) 117 (!) 122 (!) 110  Resp: (!) 28 (!) 25 (!) 24 (!) 29  Temp:      TempSrc:      SpO2: 100% 100% 100% 99%  Weight:      Height:      PainSc:        Isolation Precautions Airborne and Contact precautions  Medications Medications  insulin regular, human (MYXREDLIN) 100 units/ 100 mL infusion (1.5 Units/hr Intravenous Rate/Dose Change 11/11/23 0306)  dextrose 50 % solution 0-50 mL (has no administration in time range)  potassium chloride 10 mEq in 100 mL IVPB (10 mEq Intravenous Not Given 11/10/23 2101)  metoprolol tartrate (LOPRESSOR) tablet 25 mg (has no administration in time range)  cefTRIAXone (ROCEPHIN) 1 g in sodium chloride 0.9 % 100 mL IVPB (has no administration in time range)  dextrose 5 % in lactated ringers infusion ( Intravenous New Bag/Given 11/11/23 0218)  lactated ringers infusion ( Intravenous New Bag/Given 11/11/23 0318)  lactated ringers bolus 1,000 mL (0 mLs Intravenous Stopped 11/10/23 1731)  lactated ringers bolus 2,848 mL (0 mLs Intravenous Stopped 11/10/23 2246)  fentaNYL (SUBLIMAZE) injection 50 mcg (50 mcg Intravenous Given 11/10/23 2003)  lactated ringers bolus 1,000 mL (0 mLs Intravenous Stopped 11/10/23 2257)  potassium chloride 10 mEq in 100 mL IVPB (0 mEq Intravenous Stopped 11/10/23 2215)  potassium chloride 10 mEq in 100 mL IVPB (0 mEq Intravenous Stopped 11/11/23 0116)  metoprolol tartrate (LOPRESSOR) injection 2.5 mg (2.5 mg Intravenous Given 11/10/23 2336)  sodium chloride 0.9 % bolus 500 mL (500 mLs Intravenous New Bag/Given 11/11/23 0229)  cefTRIAXone (ROCEPHIN) 1 g in sodium chloride 0.9 % 100 mL IVPB (0 g Intravenous Stopped 11/11/23 0251)  lactated ringers bolus 1,000 mL (1,000 mLs Intravenous New Bag/Given 11/11/23 0310)    Mobility Have not seen patient ambulate     Focused Assessments Peripheral vascular assessment   R Recommendations: See Admitting Provider Note  Report given to:   Additional Notes:

## 2023-11-11 NOTE — Progress Notes (Signed)
  Echocardiogram 2D Echocardiogram has been performed.  Leda Roys RDCS 11/11/2023, 8:49 AM

## 2023-11-11 NOTE — Progress Notes (Signed)
 PROGRESS NOTE    Jonel Weldon  ZOX:[id number] DOB: 1971/01/15 DOA: 11/10/2023 PCP: Hoy Register, MD  53/M with history of type 2 diabetes mellitus, DKA, gastroparesis, right foot transmetatarsal amputation, lymphedema, morbid obesity presented to the ED with weakness, elevated blood sugars, some nausea vomiting and diarrhea, has not been taking insulin or checking blood sugars as he feels poorly, in the ED he was noted to have significant sinus tachycardia, CBGs 560s, bicarb of 9, diabetic ketoacidosis, creatinine 2.0, WBC 15.1 -CT abdomen with bilateral perinephric stranding   Subjective: Patient seen in the ICU, feels fair, does not have any symptoms related to his foot wound  Assessment and Plan:  Sepsis, POA Diabetic foot wound -I suspect source is his left foot deep diabetic wound -check MRI left foot -Add vancomycin, currently on ceftriaxone -Follow-up blood cultures  DKA -A1c is 9.2, for recent compliance with insulin -Anion gap has corrected, start glargine, wean off insulin gtt. -Needs significant diet and lifestyle modification  Paroxysmal atrial fibrillation -Not on anticoagulation at baseline, currently with sinus tachycardia, continue metoprolol, dose increased  ?  Pyelonephritis -Some perinephric stranding noted on CT yesterday, has chronic back pain, doubt related to this, urinalysis pending, check UA and urine culture  Acute kidney injury -In the setting of DKA and dehydration, improving  Chronic anemia -Follow-up anemia panel  Chronic diastolic CHF -Echo 8/24 with EF 75%, indeterminate diastolic function  2 cm right adrenal nodule   DVT prophylaxis: Add Lovenox Code Status: Full code Family Communication: None present Disposition Plan: Transfer out of ICU later today  Consultants:    Procedures:   Antimicrobials:    Objective: Vitals:   11/11/23 0730 11/11/23 0745 11/11/23 0800 11/11/23 0920  BP: (!) 155/97 (!) 140/76 (!) 143/78 134/85   Pulse: (!) 131 (!) 131 (!) 131 (!) 129  Resp: (!) 25 (!) 25 (!) 28   Temp:   98.5 F (36.9 C)   TempSrc:   Oral   SpO2: 96% 95% 95%   Weight:      Height:        Intake/Output Summary (Last 24 hours) at 11/11/2023 1016 Last data filed at 11/10/2023 1731 Gross per 24 hour  Intake 1000 ml  Output --  Net 1000 ml   Filed Weights   11/10/23 1517 11/11/23 0400  Weight: (!) 142.4 kg 128.6 kg    Examination:  Chronically ill male appears older than stated age, AAOx3 HEENT: Neck obese unable to assess JVD CVS: S1-S2, regular rhythm, tachycardic Lungs: Distant breath sounds otherwise clear Abdomen: Soft, obese, nontender, bowel sounds present Extremities: Right foot transmetatarsal amputation Left foot with quarter sized deep wound on the plantar surface with some purulence    Data Reviewe 1 d:   CBC: Recent Labs  Lab 11/10/23 1607 11/10/23 1717 11/10/23 1922 11/11/23 0319  WBC  --   --  18.9* 15.1*  NEUTROABS  --   --  17.4*  --   HGB 13.9 12.9* 11.3* 10.7*  HCT 41.0 38.0* 35.6* 32.7*  MCV  --   --  86.4 83.4  PLT  --   --  314 302   Basic Metabolic Panel: Recent Labs  Lab 11/10/23 1607 11/10/23 1717 11/10/23 1922 11/10/23 2137 11/11/23 0222 11/11/23 0319  NA 132* 134* 137 136 138 138  K 5.0 4.6 3.7 3.6 3.6 3.5  CL 108  --  107 105 109 108  CO2  --   --  10* 15* 20* 19*  GLUCOSE 645*  --  518* 444* 161* 137*  BUN 37*  --  35* 37* 33* 34*  CREATININE 1.50*  --  2.09* 2.02* 1.43* 1.51*  CALCIUM  --   --  8.5* 8.6* 8.2* 8.6*   GFR: Estimated Creatinine Clearance: 79.1 mL/min (A) (by C-G formula based on SCr of 1.51 mg/dL (H)). Liver Function Tests: Recent Labs  Lab 11/10/23 1922  AST 15  ALT 12  ALKPHOS 102  BILITOT 1.6*  PROT 7.0  ALBUMIN 2.0*   No results for input(s): "LIPASE", "AMYLASE" in the last 168 hours. No results for input(s): "AMMONIA" in the last 168 hours. Coagulation Profile: No results for input(s): "INR", "PROTIME" in the last  168 hours. Cardiac Enzymes: Recent Labs  Lab 11/10/23 1922  CKTOTAL 88   BNP (last 3 results) No results for input(s): "PROBNP" in the last 8760 hours. HbA1C: Recent Labs    11/11/23 0319  HGBA1C 9.1*   CBG: Recent Labs  Lab 11/11/23 0304 11/11/23 0420 11/11/23 0542 11/11/23 0657 11/11/23 0755  GLUCAP 139* 154* 163* 126* 140*   Lipid Profile: Recent Labs    11/11/23 0319  CHOL 153  HDL 60  LDLCALC 81  TRIG 61  CHOLHDL 2.6   Thyroid Function Tests: No results for input(s): "TSH", "T4TOTAL", "FREET4", "T3FREE", "THYROIDAB" in the last 72 hours. Anemia Panel: No results for input(s): "VITAMINB12", "FOLATE", "FERRITIN", "TIBC", "IRON", "RETICCTPCT" in the last 72 hours. Urine analysis:    Component Value Date/Time   COLORURINE YELLOW 11/10/2023 1551   APPEARANCEUR CLEAR 11/10/2023 1551   APPEARANCEUR Clear 06/25/2023 1537   LABSPEC 1.018 11/10/2023 1551   PHURINE 5.0 11/10/2023 1551   GLUCOSEU >=500 (A) 11/10/2023 1551   HGBUR MODERATE (A) 11/10/2023 1551   BILIRUBINUR NEGATIVE 11/10/2023 1551   BILIRUBINUR Negative 06/25/2023 1537   KETONESUR 80 (A) 11/10/2023 1551   PROTEINUR 100 (A) 11/10/2023 1551   UROBILINOGEN 0.2 11/14/2021 1146   NITRITE NEGATIVE 11/10/2023 1551   LEUKOCYTESUR NEGATIVE 11/10/2023 1551   Sepsis Labs: @LABRCNTIP (procalcitonin:4,lacticidven:4)  ) Recent Results (from the past 240 hours)  Resp panel by RT-PCR (RSV, Flu A&B, Covid) Urine, Clean Catch     Status: None   Collection Time: 11/10/23  3:55 PM   Specimen: Urine, Clean Catch; Nasal Swab  Result Value Ref Range Status   SARS Coronavirus 2 by RT PCR NEGATIVE NEGATIVE Final   Influenza A by PCR NEGATIVE NEGATIVE Final   Influenza B by PCR NEGATIVE NEGATIVE Final    Comment: (NOTE) The Xpert Xpress SARS-CoV-2/FLU/RSV plus assay is intended as an aid in the diagnosis of influenza from Nasopharyngeal swab specimens and should not be used as a sole basis for treatment. Nasal  washings and aspirates are unacceptable for Xpert Xpress SARS-CoV-2/FLU/RSV testing.  Fact Sheet for Patients: BloggerCourse.com  Fact Sheet for Healthcare Providers: SeriousBroker.it  This test is not yet approved or cleared by the [place] FDA and has been authorized for detection and/or diagnosis of SARS-CoV-2 by FDA under an Emergency Use Authorization (EUA). This EUA will remain in effect (meaning this test can be used) for the duration of the COVID-19 declaration under Section 564(b)(1) of the Act, 21 U.S.C. section 360bbb-3(b)(1), unless the authorization is terminated or revoked.     Resp Syncytial Virus by PCR NEGATIVE NEGATIVE Final    Comment: (NOTE) Fact Sheet for Patients: BloggerCourse.com  Fact Sheet for Healthcare Providers: SeriousBroker.it  This test is not yet approved or cleared by the [place] FDA and has been authorized for detection  and/or diagnosis of SARS-CoV-2 by FDA under an Emergency Use Authorization (EUA). This EUA will remain in effect (meaning this test can be used) for the duration of the COVID-19 declaration under Section 564(b)(1) of the Act, 21 U.S.C. section 360bbb-3(b)(1), unless the authorization is terminated or revoked.  Performed at [facility], [address], [place], [place] [address]   MRSA Next Gen by PCR, Nasal     Status: Abnormal   Collection Time: 11/11/23  4:36 AM   Specimen: Nasal Mucosa; Nasal Swab  Result Value Ref Range Status   MRSA by PCR Next Gen DETECTED (A) NOT DETECTED Final    Comment: RESULT CALLED TO, READ BACK BY AND VERIFIED WITH: B WARNER,RN@0610  11/11/23 MK (NOTE) The GeneXpert MRSA Assay (FDA approved for NASAL specimens only), is one component of a comprehensive MRSA colonization surveillance program. It is not intended to diagnose MRSA infection nor to guide or monitor treatment for  MRSA infections. Test performance is not FDA approved in patients less than 62 years old. Performed at [facility], [address], [place], [place] [address]      Radiology Studies: VAS [place] LOWER EXTREMITY VENOUS (DVT) Result Date: 11/11/2023  Lower Venous DVT Study Patient Name:  FAITH BRANAN  Date of Exam:   11/11/2023 Medical Rec #: [id number]     Accession #:    [id number] Date of Birth: [birth year]      Patient Gender: M Patient Age:   75 years Exam Location:  [facility] Procedure:      VAS [place] LOWER EXTREMITY VENOUS (DVT) Referring Phys: Jonny Ruiz DOUTOVA --------------------------------------------------------------------------------  Indications: Pain, and Swelling.  Risk Factors: Obesity. Limitations: Body habitus. Comparison Study: No significant changes seen since previous exam 11/22/18. Performing Technologist: Shona Simpson  Examination Guidelines: A complete evaluation includes B-mode imaging, spectral Doppler, color Doppler, and power Doppler as needed of all accessible portions of each vessel. Bilateral testing is considered an integral part of a complete examination. Limited examinations for reoccurring indications may be performed as noted. The reflux portion of the exam is performed with the patient in reverse Trendelenburg.  +-----+---------------+---------+-----------+----------+--------------+ RIGHTCompressibilityPhasicitySpontaneityPropertiesThrombus Aging +-----+---------------+---------+-----------+----------+--------------+ CFV  Full           Yes      Yes                                 +-----+---------------+---------+-----------+----------+--------------+   +---------+---------------+---------+-----------+----------+-------------------+ LEFT     CompressibilityPhasicitySpontaneityPropertiesThrombus Aging      +---------+---------------+---------+-----------+----------+-------------------+ CFV      Full           Yes      Yes                                       +---------+---------------+---------+-----------+----------+-------------------+ SFJ      Full                                                             +---------+---------------+---------+-----------+----------+-------------------+ FV Prox  Full                                                             +---------+---------------+---------+-----------+----------+-------------------+  FV Mid   Full                                                             +---------+---------------+---------+-----------+----------+-------------------+ FV DistalFull                    Yes                                      +---------+---------------+---------+-----------+----------+-------------------+ PFV      Full                                                             +---------+---------------+---------+-----------+----------+-------------------+ POP      Full           Yes      Yes                                      +---------+---------------+---------+-----------+----------+-------------------+ PTV      Full                    Yes                                      +---------+---------------+---------+-----------+----------+-------------------+ PERO                             Yes                  Not well visualized +---------+---------------+---------+-----------+----------+-------------------+    Summary: RIGHT: - No evidence of common femoral vein obstruction.   LEFT: - There is no evidence of deep vein thrombosis in the lower extremity.  - No cystic structure found in the popliteal fossa.  *See table(s) above for measurements and observations.    Preliminary    CT RENAL STONE STUDY Result Date: 11/10/2023 CLINICAL DATA:  Abdominal/flank pain, stone suspected EXAM: CT ABDOMEN AND PELVIS WITHOUT CONTRAST TECHNIQUE: Multidetector CT imaging of the abdomen and pelvis was performed following the standard protocol without IV contrast.  RADIATION DOSE REDUCTION: This exam was performed according to the departmental dose-optimization program which includes automated exposure control, adjustment of the mA and/or kV according to patient size and/or use of iterative reconstruction technique. COMPARISON:  08/04/2006 FINDINGS: Lower chest: No acute abnormality. Hepatobiliary: No focal hepatic abnormality. Gallbladder unremarkable. Pancreas: No focal abnormality or ductal dilatation. Spleen: No focal abnormality.  Normal size. Adrenals/Urinary Tract: 2.1 cm nodule in the right adrenal gland, not diagnostic of adenoma based on density. Left adrenal gland normal. Mild bilateral perinephric stranding. No ureteral or renal stones. No hydronephrosis. Urinary bladder unremarkable. Stomach/Bowel: Stomach, large and small bowel grossly unremarkable. Vascular/Lymphatic: Aortic atherosclerosis. No evidence of aneurysm or adenopathy. Reproductive: No visible focal abnormality. Other: No free fluid or free air. Musculoskeletal: No acute bony abnormality. IMPRESSION:  Bilateral perinephric stranding without stones or hydronephrosis. This could be related to prior insult or acute inflammation. Recommend clinical correlation to completely exclude pyelonephritis. Aortic atherosclerosis. 2.1 cm indeterminate right adrenal nodule. This could be further evaluated with nonemergent outpatient MRI if felt clinically indicated. Electronically Signed   By: Charlett Nose M.D.   On: 11/10/2023 23:11   DG Chest Portable 1 View Result Date: 11/10/2023 CLINICAL DATA:  DKA.  Shortness of breath. EXAM: PORTABLE CHEST 1 VIEW COMPARISON:  03/25/2023. FINDINGS: Bilateral lung fields are clear. Bilateral costophrenic angles are clear. Note is made of elevated right hemidiaphragm. Stable cardio-mediastinal silhouette. No acute osseous abnormalities. The soft tissues are within normal limits. IMPRESSION: *No active disease. Electronically Signed   By: Jules Schick M.D.   On: 11/10/2023  17:34     Scheduled Meds:  atorvastatin  20 mg Oral Daily   Chlorhexidine Gluconate Cloth  6 each Topical Daily   gabapentin  300 mg Oral BID   insulin aspart  0-15 Units Subcutaneous TID WC   insulin aspart  3 Units Subcutaneous TID WC   insulin glargine-yfgn  20 Units Subcutaneous Daily   metoprolol tartrate  50 mg Oral BID   mupirocin ointment  1 Application Nasal BID   Continuous Infusions:  [START ON 11/12/2023] cefTRIAXone (ROCEPHIN)  IV     dextrose 5% lactated ringers 125 mL/hr at 11/11/23 0218   potassium chloride 10 mEq (11/11/23 0922)     LOS: 0 days    Time spent:    Zannie Cove, MD [facility]   11/11/2023, 10:16 AM

## 2023-11-11 NOTE — ED Notes (Signed)
 Therisa Doyne, MD stated to leave the patient's insulin drip at a rate of 18 units/hour.

## 2023-11-11 NOTE — ED Notes (Addendum)
 Report given to [facility] on 45M.

## 2023-11-11 NOTE — ED Notes (Addendum)
 No orders for blood cultures were placed when the code sepsis timer was initiated. Floor coverage Sundil, MD was made aware along with sepsis team.

## 2023-11-11 NOTE — Progress Notes (Signed)
 Lower extremity venous duplex completed. Please see CV Procedures for preliminary results.  Shona Simpson, RVT 11/11/23 8:32 AM

## 2023-11-11 NOTE — Progress Notes (Signed)
 Sinus tachycardia-reflex tachycardia from missing dose of Lopressor Elevated troponin-secondary to demand ischemia -Elevated troponin secondary to demand ischemia - At home patient is on Lopressor 25 mg twice daily.  RN informed that patient is having sinus tachycardia EKG showing sinus tachycardia heart rate 138. -Initially patient's blood pressure was soft however it has been gradually improved after multiple fluid boluses.  Patient has elevated lactic acid, pyelonephritis, tachycardic and initially hypotensive currently hypertensive. -Sinus tachycardia secondary to reflex tachycardia as patient missing dose of home Lopressor. - Resuming oral Lopressor 25 mg twice daily. -Continue cardiac monitoring.  After giving oral Lopressor if his heart rate is still high above 130 in that case if blood pressure allows can give IV Lopressor 2.5 mg. -Elevated troponin was 23 and 22.  Patient denies any chest pain and chest pressure.  Initial EKG showed ectopic atrial tachycardia heart rate 145. Troponin in the setting of demand ischemia. -Continue cardiac monitor and troponin.  Tereasa Coop, MD [facility] 11/11/2023, 6:24 AM

## 2023-11-11 NOTE — Progress Notes (Addendum)
 Pharmacy Antibiotic Note  Taylor Hughes is a 53 y.o. male admitted on 11/10/2023 with DKA and wound infection.  Pharmacy has been consulted for Vancomycin dosing.  WBC 15.1, PCT 5.15, Tmax 99.3. Hospitalist ruling out diabetic foot wound. Pending MRI L foot.  Acute kidney injury on admission, improving. Scr 2.09 >> 1.43.   Plan: Vancomycin 2000 mg x1  Vancomycin 1750 mg q24h  Will follow levels, cultures results, renal function, and length of therapy   Height: 5\' 11"  (180.3 cm) Weight: 128.6 kg (283 lb 8.2 oz) IBW/kg (Calculated) : 75.3  Temp (24hrs), Avg:98.8 F (37.1 C), Min:98.5 F (36.9 C), Max:99.3 F (37.4 C)  Recent Labs  Lab 11/10/23 1607 11/10/23 1922 11/10/23 2137 11/11/23 0019 11/11/23 0222 11/11/23 0319  WBC  --  18.9*  --   --   --  15.1*  CREATININE 1.50* 2.09* 2.02*  --  1.43* 1.51*  LATICACIDVEN  --   --  3.7* 3.4* 2.9*  --     Estimated Creatinine Clearance: 79.1 mL/min (A) (by C-G formula based on SCr of 1.51 mg/dL (H)).    No Known Allergies  Antimicrobials this admission: Ceftriaxone 4/2 >>  Vancomycin 4/2 >>   Microbiology results: 4/2 MRSA PCR pos  4/2 Bcx pending   Thank you for allowing pharmacy to be a part of this patient's care.  Cedric Fishman 11/11/2023 10:32 AM

## 2023-11-11 NOTE — Assessment & Plan Note (Signed)
 CT worrisome for pyelonephritis Given persistent lactic acidosis and elevated WBC will cover for pyelo with rocephin tonihgt

## 2023-11-11 NOTE — Progress Notes (Signed)
 Initial Nutrition Assessment  DOCUMENTATION CODES:  Morbid obesity  INTERVENTION:  Continue current diet as ordered, monitor PO intake MVI with minerals daily 1 packet Juven BID, each packet provides 95 calories, 2.5 grams of protein (collagen) + micronutrients for wound healing  NUTRITION DIAGNOSIS:  Increased nutrient needs related to wound healing as evidenced by estimated needs.  GOAL:  Patient will meet greater than or equal to 90% of their needs  MONITOR:  PO intake, Supplement acceptance, Weight trends, Labs  REASON FOR ASSESSMENT:  Rounds (wounds)    ASSESSMENT:  Pt with hx of DM type 2, gastroparesis, lymphedema, and HTN presented to ED with ongoing N/V x 3 days. Found to be in DKA.  Pt resting in bed at the time of assessment. Diet order added this AM but pt reports he has not had anything to eat yet. States having some issues with nausea, but feeling better after receiving anti-emetic. Pt reports normally intake is good, but that for the last 3 days he has not eaten anything.  Inquired about weight - pt unsure how much he weighs and also states that he has lymphedema in his legs so weight is variable. Does think he has lost weight though. Obtained new bed weight which is more inline with usual weight  Do note that pt has hx of partial amputation of his right foot and currently has ulcer present to right. Discussed wound healing with pt, will add juven  Admit weight: 142.4 kg ? accuracy  Current weight: 128.6 kg   Intake/Output Summary (Last 24 hours) at 11/11/2023 1541 Last data filed at 11/11/2023 0800 Gross per 24 hour  Intake 2024.57 ml  Output --  Net 2024.57 ml  Net IO Since Admission: 2,024.57 mL [11/11/23 1541]  Nutritionally Relevant Medications: Scheduled Meds:  atorvastatin  20 mg Oral Daily   insulin aspart  0-15 Units Subcutaneous TID WC   insulin aspart  3 Units Subcutaneous TID WC   insulin glargine-yfgn  20 Units Subcutaneous Daily   Continuous  Infusions:  [START ON 11/12/2023] cefTRIAXone (ROCEPHIN)  IV     dextrose 5% lactated ringers 125 mL/hr at 11/11/23 0218   potassium chloride 10 mEq (11/11/23 0922)   Labs Reviewed: BUN 34, creatinine 1.51 Phosphorus <1.0  CBG ranges from 124-598 mg/dL over the last 24 hours HgbA1c 9.1%  NUTRITION - FOCUSED PHYSICAL EXAM: Flowsheet Row Most Recent Value  Orbital Region No depletion  Upper Arm Region No depletion  Thoracic and Lumbar Region No depletion  Buccal Region No depletion  Temple Region No depletion  Clavicle Bone Region No depletion  Clavicle and Acromion Bone Region No depletion  Scapular Bone Region Mild depletion  Dorsal Hand Moderate depletion  Patellar Region No depletion  Anterior Thigh Region No depletion  Posterior Calf Region No depletion  Edema (RD Assessment) Severe  [BLE, generalized]  Hair Reviewed  Eyes Reviewed  Mouth Reviewed  Skin Reviewed  Nails Reviewed    Diet Order:   Diet Order             Diet heart healthy/carb modified Fluid consistency: Thin  Diet effective now                   EDUCATION NEEDS:  Education needs have been addressed  Skin:  Skin Assessment: Reviewed RN Assessment (Diabetic foot ulcer, left) (4 x 3 x 1 cm)  Last BM:  unsure  Height:  Ht Readings from Last 1 Encounters:  11/10/23 5\' 11"  (1.803 m)  Weight:  Wt Readings from Last 1 Encounters:  11/11/23 (!) 137.4 kg    Ideal Body Weight:  78.2 kg  BMI:  Body mass index is 42.25 kg/m.  Estimated Nutritional Needs:  Kcal:  2500-2700 kcal/d Protein:  120-135 g/d Fluid:  2.2-2.5L/d    Greig Castilla, RD, LDN Registered Dietitian II Please reach out via secure chat

## 2023-11-11 NOTE — Discharge Instructions (Signed)
 [place]  [facility] Address: [address] [place], [place] [address] Phone Number: [phone] Hours of Operation: Residents of [place] can come to obtain food Monday through Friday from 8:30am until 3:30pm. Photo ID and Social Security cards required for all residents of a household. Can come six times a year  THE [facility] Address: [address] [place], [place] [address] Phone Number: [phone] Hours of Operation: Operates Tuesday-Friday 10:00 a.m. to 1 p.m. Requirements: Referral from DSS needed. May come 6 times a year, 30 days apart. Photo ID and SS required for all residents of household.  [facility] Address: [address] [place], [place] [address] Phone Number: [phone] Hours of Operation: Food pantry is open on the last Saturday of each month from 10:00 am - 12:00 noon. No appointment needed. No qualifications.  [facility] Address: [address] [place], [place] [address] Phone Number: [phone]. [phone] Hours of Operation: Must make reservations to pick up food on Saturdays. Sign ups for Saturday pick up beginning at 8:30 a.m. on Monday morning.  [facility] Address: [address] [place], [place] [address] Phone Number: [phone] Hours of Operation: If you need food, bring proper identification such as a driver's license to receive a bag of food once a month. Requirements: Can come once every 30 days with referral DSS, Holiday representative, Mental health etc. Each referral good for six visits. Photo ID required. *1st visit no referral required.  [facility] Address: [address] [place], [place] [address] Phone Number: [phone]  [facility] Address: [address] [place], [place] [address] Phone Number: [phone] Hours of Operation:  You can register at [email or web address] for free groceries  FREE INDEED FOOD  PANTRY Address: [address] [place], [place] [address] Phone Number: [phone] Hours of Operation: Drive through giveaway, first come first served. Every 3rd Saturday 11AM - 1PM  [facility] [address] Address: [address] [place] [address] Phone Number: [phone]   [place]  HAND TO HAND FOOD PANTRY Address: [address] [place], [place] [address] Phone Number: [phone] Hours of Operation: Once a month every 3rd Saturday  [facility] Address: [address] [place], [place] [address] Phone Number: [phone] Hours of Operation: Distribution happens from 9:00-10:00 a.m. every Saturday.     [facility] Address: [address] [place], [place] [address] Phone Number: [phone] Hours of Operation: ONCE a week for the community food distribution held every Tuesday, Wednesday and Thursday from 11 a.m. - 2:00 p.m. Food is available on a first come, first serve basis and varies week to week. No appointment necessary for drive thru pick up.  [facility] Address: [address] [place], [place] [address] Phone Number: [phone] Hours of Operation: Open every 3rd Thursday 9:30 a.m. - 11:00 a.m.  [facility] Address: [address] [place], [place] [address] Phone Number: [phone] Hours of Operation: Please call for hours, directions, and questions  [facility] [place] [facility] Address: [address] [place], [place]  [address] Phone Number: [phone] Website: https://www.Hollyguns.co.za Food Finder app: https://findfood.ghpfa.org  [facility] Address: [address] [place], [place] [address] Phone Number: [phone] Hours of Operation: Contact Bree Harpe. Enrolled Substance Abuse Clients Only  [facility] Address: [address] [place], [address]  Phone Number: [phone] Hours of Operation: Contact Clyde Lundborg. Food pantry open the 3rd Saturday of each month  from 9 a.m. -12 p.m. only  [place] [facility] Address: [address] [place], [place] [address] Phone Number: [phone] Hours of Operation: Contact Juanda Crumble. Emergency food bank open on Saturdays by appointment only  [facility] Address: [address] [place], [place] [address] Phone Number: [phone] Hours of Operation: No specific contact person; Anyone can help  [facility] Address: [address] [place], [place] [address] Phone Number: [phone] Hours of Operation: Contact Carney Bern. Agency gives out a bag of food every Thursday from 2-4 p.m. only, and also provides a community meal every Thursday between 5-6 p.m. Other services provided include rent/mortgage and utility assistance, women's winter shelter, thrift store, and senior adult activities.  [facility] [place] Address: [address] [place], [place] [address] Phone Number: [phone] Hours of Operation: The Emergency Food Assistance Program provides individuals and families with a generous supply of food including meat, fresh vegetables, and nonperishable items. The food box contains five days' worth of food, and each family or individual can receive a box once per month. M, W, Th, Fr 11am-2pm, walk-ins welcome.  [facility] Address: [address] [place], [place] [address]  Phone Number: [phone] Hours of Operation: Contact Asia Scarlette Cal[place]. Tuesdays and Thursdays from 11am - 3pm by appointment only  [facility] assistance programs Crisis assistance programs  -[facility]eless[facility] Coordinated Entry Program. If you are experiencing homelessness in [place], [place], your first point of contact should be Pensions consultant. You can reach Coordinated Entry by calling (336) 6166862499 or by emailing coordinatedentry@partnersendinghomelessness .org.  Community access points: [employer]  [phone] [address], [place]) every Tuesday from 9am-10am. [facility] ([address], [place]) every Wednesday from 8am-9am.   -[facility] Coordinated Re-entry Marcy Panning: Dial 211 and request. Offers referrals to homeless shelters in the area.    -The [employer] 818-435-2120) offers several services to local families, as funding allows. The Emergency Assistance Program (EAP), which they administer, provides household goods, free food, clothing, and financial aid to people in need in the [facility] area. The EAP program does have some qualification, and counselors will interview clients for financial assistance by written referral only. Referrals need to be made by the Department of Social Services or by other EAP approved human services agencies or charities in the area.  -[facility] of [employer], which can be reached at ([phone], offers emergency assistance programs for those in need of help, such as food, rent assistance, a soup kitchen, shelter, and clothing. They are based in [facility] but provide a number of services to those that qualify for assistance.   [facility] Department of Social Services may be able to offer temporary financial assistance and cash grants for paying rent and utilities, Help may be provided for local county residents who may be experiencing personal crisis when other resources, including government programs, are not available. Call [phone]  -High [employer] Army is a [employer] agency, The organization can offer emergency assistance for paying rent, Caremark Rx, utilities, food, household products and furniture. They offer extensive emergency and transitional housing for families, children and single women, and also run a Boy's and [employer]. Thrift Shops, Secondary school teacher, and other aid offered too. [address] [place], [place] [address], [phone]  -[place] Low Income Energy Assistance Program --  This is offered for [facility] families. The federal government created [employer] Program provides a one-time cash grant payment to help eligible low-income families pay their electric and heating bills. [address] [place], [place] [address], [phone]  -[place] Emergency Assistance -- A program offers emergency utility and rent funds for greater [employer] area residents. The program can also provide counseling and referrals to charities and government programs. Also provides food and a free meal program that serves lunch Mondays - Saturdays and dinner seven days per week to individuals in the community. [address] [place], [place] [address], [phone]  -[employer] - Offers affordable apartment and housing communities across      [place] and [place]. The low income and seniors can access public housing, rental assistance to qualified applicants, and apply for the section 8 rent subsidy program. Other programs include Chiropractor and Engineer, maintenance. [address] [place], [place] [address], dial [phone].  -The [facility] provides transitional housing to veterans and the disabled. Clients will also access other services too, including assistance in applying for Disability, life skills classes, case management, and assistance in finding permanent housing. [address] [place], [place] [address], call [phone]  -[facility] [place] Transitional Housing through [employer] is for people who were just evicted or that are formerly homeless. The non-profit will also help then gain self-sufficiency, find a home or apartment to live in, and also provides information on rent assistance when needed. Phone [phone]  -The [place] [employer] helps low income, elderly, or disabled residents in seven counties in the [place] ([place], [place], [place], [place], [place], [place], [place], and [place]) save energy and reduce their utility bills by improving energy efficiency. Phone [phone].  -[employer] is located in the [place] [facility] in the [employer], [address], [place], [place] [address]. Parking is in the rear of the building. Phone: [phone]   General Email: info@gsohc .org  GHC provides free housing counseling assistance in locating affordable rental housing or housing with support services for families and individuals in crisis and the chronically homeless. We provide potential resources for other housing needs like utilities. Our trained counselors also work with clients on budgeting and financial literacy in effort to empower them to take control of their financial situations. [employer] collaborates with homeless service providers and other stakeholders as part of the [employer] COC (Continuum of Care). The (COC) is a regional/local planning body that coordinates housing and services funding for homeless families and individuals. The role of [facility] in the COC is through housing counseling to work with people we serve on diversion strategies for those that are at imminent risk of becoming homeless. We also work with the Coordinated Assessment/Entry Specialist who attempts to find temporary solutions and/or connects the people to Housing First, Rapid Re-housing or transitional housing programs. Our Homelessness Prevention Housing Counselors meet with clients on business days (Monday-Fridays, except scheduled holidays) from 8:30 am to 4:30 pm.  Legal assistance for evictions, foreclosure, and more -If you need free legal advice on civil issues, such as foreclosures, evictions, Electronics engineer, government  programs, domestic issues and more, Armed forces operational officer Aid of [facility]) is a Associate Professor firm that provides free legal services and counsel to lower income people, seniors, disabled, and others, The goal is to ensure everyone has access to justice  and fair representation. Call them at [phone].  [facility] for [facility] and Community Studies can provide info about obtaining legal assistance with evictions. Phone [phone].  Data processing manager  The [employer], [employer]. offers job and Dispensing optician. Resources are focused on helping students obtain the skills and experiences that are necessary to compete in today's challenging and tight job market. The non-profit faith-based community action agency offers internship trainings as well as classroom instruction. Classes are tailored to meet the needs of people in the [facility] region. [place], [place] [address], [phone]  Foreclosure prevention/Debt Services Family Services of the [employer] Credit Counseling Service inludes debt and foreclosure prevention programs for local families. This includes money management, financial advice, budget review and development of a written action plan with a Pensions consultant to help solve specific individual financial problems. In addition, housing and mortgage counselors can also provide pre- and post-purchase homeownership counseling, default resolution counseling (to prevent foreclosure) and reverse mortgage counseling. A Debt Management Program allows people and families with a high level of credit card or medical debt to consolidate and repay consumer debt and loans to creditors and rebuild positive credit ratings and scores. Contact [phone].  Community clinics in [place] -Health Department [facility]: [address] [place], [place], [address]. [phone].  -Health Department [place] [facility]: [address],  [facility], [address]. [phone].  -[facility] offers medical care through a group of doctors, pharmacies and other healthcare related agencies that offer services for low income, uninsured adults in [place]. Also offers adult Dental care and assistance with applying for an [employer]. Call [phone].   [facility]. This center provides low-cost health care to those without health insurance. Services offered include an onsite pharmacy. Phone [phone]. [address], [place].  -Medication Assistance Program serves as a link between pharmaceutical companies and patients to provide low cost or free prescription medications. This service is available for residents who meet certain income restrictions and have no insurance coverage. PLEASE CALL [phone] Ginette Otto) OR [phone] ([place])  -One Step Further: Materials engineer, The [employer] Support & Nutrition Program, [employer]. Call [phone]/ [phone].  Food pantry and assistance -[facility]Food [facility]: [address], [place] [address]. Phone [phone]  -[facility] Food Pantry: [address] [place], [place] [address]. [phone].  -Missionary Ministry: has the purpose of visiting the sick and shut-ins and provide for needs in the surrounding communities. Call [phone]. Email: [email or web address] [email or web address] This program provides: Food box for seniors, Financial assistance, Food to meet basic nutritional needs.  -Meals on Wheels with Senior Resources: [facility] residents age 40 and over who are homebound and unable to obtain and prepare a nutritious meal for themselves are eligible for this service. There may be a waiting list in certain parts of [facility] if the route in that area is full. If you are in [facility] and [place] call [phone] to register. For all other  areas call [phone] to register.  -Greater Dietitian: https://findfood.BargainContractor.si  TRANSPORTATION: -[employer] Department of Health: Call [facility] and [employer] at [phone] for details. AttractionGuides.es  -Access [place]: Access [place] is the [employer] Agency's shared-ride transportation service for eligible riders who have a disability that prevents them from riding the fixed route bus. Call [phone]. Access [place] riders must pay a  fare of $1.50 per trip, or may purchase a 10-ride punch card for $14.00 ($1.40 per ride) or a 40-ride punch card for $48.00 ($1.20 per ride).  -The Shepherd's WHEELS rideshare transportation service is provided for senior citizens (60+) who live independently within [place] city limits and are unable to drive or have limited access to transportation. Call [phone] to schedule an appointment.  -[place] Transportation: For Medicare or Medicaid recipients call [phone]?[facility], wheelchair Zenaida Niece, and ambulatory quotes available.   FLEEING VIOLENCE: -Family Services of the [place]- 24/7 Crisis line [phone]) -[facility] Psychiatric Justice Centers: ([phone]-[phone])  [place] 2-1-1 is another useful way to locate resources in the community. Visit ShedSizes.ch to find service information online. If you need additional assistance, 2-1-1 Referral Specialists are available 24 hours a day, every day by dialing 2-1-1 or [phone] from any phone. The call is free, confidential, and available in any language.  Affordable Housing Search http://www.nchousingsearch.org  [facility])   M-F 8a-3p [address] [place], [place] [address] [phone] Services include: laundry, barbering, support groups, case management, phone & computer access, showers, AA/NA mtgs, mental  health/substance abuse nurse, job skills class, disability information, VA assistance, spiritual classes, etc. Winter Shelter available when temperatures are less than 32 degrees.   HOMELESS SHELTERS [facility] at [facility]- Call [phone] ext. [phone] or ext. 336. Located at [address], [place], [place] [address]  [facility] Mens Shelter- Call [phone]. Located at [address] [place] [address].  [facility]- [employer]. Call [phone]. Office located at [address] [place] [address].  Pathways Family Housing through [place] [phone].  [facility] [place] Family [facility]- Call [phone]. Located at [address] [place], [place], [place] [address].  Room at the Inn-For Pregnant mothers. Call [phone]. Located at [address] [place], [address].   Shelter of Hope-For men in [place]. Call [phone]. [facility]-Shelter in [place]. Call [phone].  Home of [employer] for [employer] [phone]. Office located at [address], [place], [address].  [employer] be agreeable to help with chores. Call [phone] ext. [phone].  Men's: [address] [place], [place] [address]. Women's: [facility]  [address] [place], [place] [address]  Crisis Services Therapeutic Alternatives Mobile Crisis Management- [phone]  [facility] [address], [place], [place] [address]. Phone: [phone]  Rent/Utility Assistance in [facility]:  INNOVATIVE PATHWAYS [address] [place], [place] [address] [phone] Mon 8:00am - 6:00pm; Tue 8:00am - 6:00pm; Wed 8:00am - 6:00pm; Thu 8:00am - 6:00pm; Fri 8:00am - 6:00pm; Email: [email or web address] [email or web address] Eligibility: Residents of [place], [place], [place], [place], [place] and [place] that meet income limits. Call or text for eligibility  screening.   [facility] [address] [place], [place], [place] [address] [phone] (Main: Rental Assistance) [phone] (Main: Utility Assistance) Mon 8:30am - 5:00pm; Tue 8:30am - 5:00pm; Wed 8:30am - 5:00pm; Thu 8:30am - 5:00pm; Fri 8:30am - 5:00pm; Website: http://www.greensborourbanministry.org/emergency-assistance-program Eligibility: People who have an unexpected crisis or emergency that can be verified. Must have some form of income and meet income limits. At the first of the month, only helps with rent/mortgage assistance for those who have court ordered eviction notices. Call for application information. Call for exact documents that will be needed. Examples of documents that may be needed: Photo ID, Social Security cards for everyone  in the household, and proof of income for previous 2 months. Copy of eviction notice for rent assistance and copy of final notice for utility assistance. Statements or receipts of bills for previous 2 months.   [facility] - [place] [address] [place], [place] [address] [phone] (Main) [phone] (Alternate) Mon 9:00am - 5:00pm; Tue 9:00am - 5:00pm; Wed 9:00am - 5:00pm; Thu 9:00am - 5:00pm; Fri 9:00am - 5:00pm; Website: http://southernusa.salvationarmy.org/[place]/emergency-financial-assistance Email: nscpathwayofhopegso@[email or web address] .salvationarmy.org Eligibility: People experiencing a housing crisis with past-due rent and/or utilities and meet income limits. Must be willing to take part in 6 Call or visit website to download application. Return complete application by mail or email only. Documents: Help with Utilities: Photo ID, proof of household income, copies of monthly bills or receipts, and a final disconnection/shut-off notice. Help with Rent or Mortgage: Photo ID, proof of income, copies of monthly bills or receipts, and eviction notice. Help with Household Goods: Photo ID, proof of household income,  copies of monthly bills or receipts, and a fire or flood report.  [facility] [place] [address] [place], [place] [address] [phone] (Main) Mon 8:00am - 5:00pm; Tue 8:00am - 5:00pm; Wed 8:00am - 5:00pm; Thu 8:00am - 5:00pm; Fri 8:00am - 12:00pm; Website: http://southernusa.salvationarmy.org/[place]/emergency-financial-assistance Email: [email or web address] .[email or web address] Call for eligibility information. Apply :Utilities Assistance: Visit office by 8:30am on 1st and 4th Monday of each month to pick up application. Rent and Mortgage Assistance: Visit office by 8:30am on 2nd and 3rd Monday of each month to pick up application. NOTE: If Monday falls on a holiday applications can be picked up the following Tuesday. Documents required will be listed on application.  [facility] [phone] (Main) Seen by appointment only. Call for more information. Eligibility: Meet income limits. Apply: Call for information on how to schedule an appointment. Each month there is a specific day to call to schedule an appointment. It is stated on the agency voicemail message. Appointments fill up quickly each month. Documents: Photo ID, copy of current utility bill.  [facility] [place] [address] [place], [place] [address] [phone] (Main) Tue 9:00am - 4:00pm; Wed 9:00am - 4:00pm; Thu 9:00am - 4:00pm; Website: http://www.helpinghandshigh[place].org Email: helpinghandsclientassistance@gmail .com Eligibility: Utility Assistance: Meet income limits and be a Holiday representative. [employer] customers do not qualify. Must not have received utility assistance for another agency within the last 90 days. Rent Assistance: Residents of [place] who meet income limits. Must not have received rent assistance for another agency within the last 90 days. Apply: Call to schedule an appointment. Documents: Utility Assistance: Photo ID, City  of [employer], copy of lease (if not paying a mortgage), proof of income, and monthly expenses. Rent Assistance: Photo ID, W-9 from the landlord, copy of the lease, proof of income, and a list of monthly expenses.  [facility] - [place] [address] [place], [place] [address] [phone] (Main: Help With Rent) [phone] (Main: Help With Utilities) Mon 9:00am - 4:00pm; Tue 9:00am - 4:00pm; Wed 9:00am - 4:00pm; Thu 9:00am - 4:00pm; Fri 9:00am - 4:00pm; Website: [email or web address] Email: [email or web address] Eligibility: People experiencing a financial crisis. Apply: Call to schedule an appointment Wednesday, 7:30am. Documents: Photo ID, Social Security card, proof of income, and proof of address. Other documents may be required, depending on service. Call for more information.  LOW INCOME ENERGY ASSISTANCE PROGRAM DEPARTMENT OF SOCIAL SERVICES - [facility]  [address] [place], [place] [address] [phone] (Main) Mon 8:00am - 5:00pm; Tue 8:00am - 5:00pm; Wed 8:00am - 5:00pm; Thu 8:00am - 5:00pm; Fri 8:00am - 5:00pm; Website: [email or web address] Eligibility: Meet income limits and resource guidelines. Each household is only eligible once, even if multiple members apply. Apply: Call to see if funds are available. Visit to complete an application, call to have 1 mailed, or apply online at [email or web address] NOTE: Households with a person age 60 and over or a person with a documented disability can apply beginning December 1. Other households can apply beginning January 1. Documents: Photo ID, birth certificate, proof of household income, copy of utility bill, latest bank statement, the names and Social Security numbers for everyone in the household, and proof of disability if under age 35.  LOW INCOME ENERGY ASSISTANCE  PROGRAM DEPARTMENT OF SOCIAL SERVICES - [facility] [address] [place], [place], [place] [address] [phone] (Main) Mon 8:00am - 5:00pm; Tue 8:00am - 5:00pm; Wed 8:00am - 5:00pm; Thu 8:00am - 5:00pm; Fri 8:00am - 5:00pm; Website: [email or web address] Eligibility: Meet income limits and resource guidelines. Each household is only eligible once, even if multiple members apply. Apply: Call to see if funds are available. Visit to complete an application, call to have 1 mailed, or apply online at [email or web address] NOTE: Households with a person age 92 and over or a person with a documented disability can apply beginning December 1. Other households can apply beginning January 1. Documents: Photo ID, birth certificate, proof of household income, copy of utility bill, latest bank statement, the names and Social Security numbers for everyone in the household, and proof of disability if under age 107.

## 2023-11-12 ENCOUNTER — Other Ambulatory Visit: Payer: Self-pay

## 2023-11-12 DIAGNOSIS — E1110 Type 2 diabetes mellitus with ketoacidosis without coma: Secondary | ICD-10-CM | POA: Diagnosis not present

## 2023-11-12 LAB — GLUCOSE, CAPILLARY
Glucose-Capillary: 301 mg/dL — ABNORMAL HIGH (ref 70–99)
Glucose-Capillary: 291 mg/dL — ABNORMAL HIGH (ref 70–99)
Glucose-Capillary: 270 mg/dL — ABNORMAL HIGH (ref 70–99)
Glucose-Capillary: 258 mg/dL — ABNORMAL HIGH (ref 70–99)

## 2023-11-12 LAB — BASIC METABOLIC PANEL WITH GFR
Anion gap: 7 (ref 5–15)
BUN: 34 mg/dL — ABNORMAL HIGH (ref 6–20)
CO2: 19 mmol/L — ABNORMAL LOW (ref 22–32)
Calcium: 8.1 mg/dL — ABNORMAL LOW (ref 8.9–10.3)
Chloride: 108 mmol/L (ref 98–111)
Creatinine, Ser: 1.14 mg/dL (ref 0.61–1.24)
GFR, Estimated: >60 mL/min (ref >60)
Glucose, Bld: 276 mg/dL — ABNORMAL HIGH (ref 70–99)
Potassium: 4.4 mmol/L (ref 3.5–5.1)
Sodium: 134 mmol/L — ABNORMAL LOW (ref 135–145)

## 2023-11-12 LAB — URINE CULTURE

## 2023-11-12 LAB — CBC
HCT: 34.2 % — ABNORMAL LOW (ref 39.0–52.0)
Hemoglobin: 11 g/dL — ABNORMAL LOW (ref 13.0–17.0)
MCH: 27.2 pg (ref 26.0–34.0)
MCHC: 32.2 g/dL (ref 30.0–36.0)
MCV: 84.4 fL (ref 80.0–100.0)
Platelets: 304 10*3/uL (ref 150–400)
RBC: 4.05 MIL/uL — ABNORMAL LOW (ref 4.22–5.81)
RDW: 18.3 % — ABNORMAL HIGH (ref 11.5–15.5)
WBC: 15 10*3/uL — ABNORMAL HIGH (ref 4.0–10.5)
nRBC: 0 % (ref 0.0–0.2)

## 2023-11-12 LAB — PHOSPHORUS: Phosphorus: 2.1 mg/dL — ABNORMAL LOW (ref 2.5–4.6)

## 2023-11-12 LAB — COMPREHENSIVE METABOLIC PANEL WITH GFR
ALT: 10 U/L (ref 0–44)
AST: 15 U/L (ref 15–41)
Albumin: 1.8 g/dL — ABNORMAL LOW (ref 3.5–5.0)
Alkaline Phosphatase: 76 U/L (ref 38–126)
Anion gap: 17 — ABNORMAL HIGH (ref 5–15)
BUN: 38 mg/dL — ABNORMAL HIGH (ref 6–20)
CO2: 15 mmol/L — ABNORMAL LOW (ref 22–32)
Calcium: 8.5 mg/dL — ABNORMAL LOW (ref 8.9–10.3)
Chloride: 103 mmol/L (ref 98–111)
Creatinine, Ser: 1.31 mg/dL — ABNORMAL HIGH (ref 0.61–1.24)
GFR, Estimated: >60 mL/min (ref >60)
Glucose, Bld: 299 mg/dL — ABNORMAL HIGH (ref 70–99)
Potassium: 4 mmol/L (ref 3.5–5.1)
Sodium: 135 mmol/L (ref 135–145)
Total Bilirubin: 0.8 mg/dL (ref 0.0–1.2)
Total Protein: 6.5 g/dL (ref 6.5–8.1)

## 2023-11-12 LAB — PROCALCITONIN: Procalcitonin: 4.55 ng/mL

## 2023-11-12 LAB — MAGNESIUM: Magnesium: 2 mg/dL (ref 1.7–2.4)

## 2023-11-12 MED ORDER — SODIUM CHLORIDE 0.9 % IV BOLUS
500.0000 mL | Freq: Once | INTRAVENOUS | Status: AC
  Administered 2023-11-12: 500 mL via INTRAVENOUS

## 2023-11-12 MED ORDER — SODIUM CHLORIDE 0.9 % IV SOLN
2.0000 g | INTRAVENOUS | Status: DC
  Administered 2023-11-13 – 2023-11-16 (×4): 2 g via INTRAVENOUS
  Filled 2023-11-12 (×4): qty 20

## 2023-11-12 MED ORDER — ENOXAPARIN SODIUM 80 MG/0.8ML IJ SOSY
70.0000 mg | PREFILLED_SYRINGE | INTRAMUSCULAR | Status: DC
  Administered 2023-11-13 – 2023-11-17 (×5): 70 mg via SUBCUTANEOUS
  Filled 2023-11-12 (×5): qty 0.8

## 2023-11-12 MED ORDER — ENOXAPARIN SODIUM 40 MG/0.4ML IJ SOSY
40.0000 mg | PREFILLED_SYRINGE | INTRAMUSCULAR | Status: DC
  Administered 2023-11-12: 40 mg via SUBCUTANEOUS
  Filled 2023-11-12: qty 0.4

## 2023-11-12 MED ORDER — HYDROMORPHONE HCL 1 MG/ML IJ SOLN
1.0000 mg | Freq: Once | INTRAMUSCULAR | Status: AC
  Administered 2023-11-12: 1 mg via INTRAVENOUS
  Filled 2023-11-12: qty 1

## 2023-11-12 MED ORDER — SODIUM CHLORIDE 0.9 % IV BOLUS
1000.0000 mL | Freq: Once | INTRAVENOUS | Status: AC
  Administered 2023-11-12: 1000 mL via INTRAVENOUS

## 2023-11-12 MED ORDER — INSULIN ASPART 100 UNIT/ML IJ SOLN
5.0000 [IU] | Freq: Once | INTRAMUSCULAR | Status: AC
  Administered 2023-11-12: 5 [IU] via SUBCUTANEOUS

## 2023-11-12 MED ORDER — K PHOS MONO-SOD PHOS DI & MONO 155-852-130 MG PO TABS
500.0000 mg | ORAL_TABLET | Freq: Once | ORAL | Status: AC
  Administered 2023-11-12: 500 mg via ORAL
  Filled 2023-11-12: qty 2

## 2023-11-12 NOTE — Plan of Care (Signed)

## 2023-11-12 NOTE — Progress Notes (Addendum)
 PROGRESS NOTE    Taylor Hughes  FAO:[id number] DOB: 03/17/1971 DOA: 11/10/2023 PCP: Hoy Register, MD  53/M with history of type 2 diabetes mellitus, DKA, gastroparesis, right foot transmetatarsal amputation, lymphedema, morbid obesity presented to the ED with weakness, elevated blood sugars, some nausea vomiting and diarrhea, has not been taking insulin or checking blood sugars as he feels poorly, in the ED he was noted to have significant sinus tachycardia, CBGs 560s, bicarb of 9, diabetic ketoacidosis, creatinine 2.0, WBC 15.1 -CT abdomen with bilateral perinephric stranding   Subjective: Second events overnight, denies any complaints.  Assessment and Plan:  Sepsis, POA Diabetic foot wound -Source of sepsis most likely related to left foot diabetic wound, MRI of left foot has been obtained, fortunately no evidence of abscess osteomyelitis -Blood cultures, meanwhile keep empirically on IV vancomycin and Rocephin. -Discussed with Dr. [name], will assess wound today to give recommendation regarding wound care and weightbearing status -Procalcitonin remains elevated, but trending down  Diabetes mellitus, type II, uncontrolled with hyperglycemia -A1c is 9.2 -Discussed with the patient, reports poor compliance few days prior to admission. -Transitioned off insulin drip to subcu insulin as anion gap has closed, this morning anion gap is up again at 17, will give fluid bolus and subcu insulin and repeat labs if anion gap remains elevated will start back on insulin drip.  Paroxysmal atrial fibrillation -Not on anticoagulation at baseline, currently with sinus tachycardia, continue metoprolol, dose increased  ?  Pyelonephritis -Some perinephric stranding noted on CT yesterday, has chronic back pain, doubt related to this, urinalysis pending, UA is negative  Acute kidney injury -In the setting of DKA and dehydration, improving  Chronic anemia -Follow-up anemia panel  Severe  hypophosphatemia -replaced   Chronic diastolic CHF -Echo 8/24 with EF 75%, indeterminate diastolic function  2 cm right adrenal nodule -Her workup and imaging as an outpatient   DVT prophylaxis: Add Lovenox Code Status: Full code Family Communication: None present Disposition Plan:  in progressive  Consultants:  PCCM Orthopedic  Procedures:   Antimicrobials:    Objective: Vitals:   11/11/23 2315 11/11/23 2339 11/12/23 0312 11/12/23 1051  BP: 137/73   (!) 151/85  Pulse: 87   100  Resp: (!) 26     Temp:  98.6 F (37 C) 99 F (37.2 C)   TempSrc:  Oral Axillary   SpO2: 92%     Weight:      Height:        Intake/Output Summary (Last 24 hours) at 11/12/2023 1259 Last data filed at 11/11/2023 2300 Gross per 24 hour  Intake 419.74 ml  Output 700 ml  Net -280.26 ml   Filed Weights   11/10/23 1517 11/11/23 0400 11/11/23 1528  Weight: (!) 142.4 kg 128.6 kg (!) 137.4 kg    Examination:  Awake Alert, Oriented X 3, No new F.N deficits, Normal affect Symmetrical Chest wall movement, Good air movement bilaterally, CTAB RRR,No Gallops,Rubs or new Murmurs, No Parasternal Heave +ve B.Sounds, Abd Soft, No tenderness, No rebound - guarding or rigidity. Bilateral lower extremity lymphedema, right foot TMA, left foot with wound intraservice, please see picture below       Data Reviewe 1 d:   CBC: Recent Labs  Lab 11/10/23 1607 11/10/23 1717 11/10/23 1922 11/11/23 0319 11/12/23 0449  WBC  --   --  18.9* 15.1* 15.0*  NEUTROABS  --   --  17.4*  --   --   HGB 13.9 12.9* 11.3* 10.7* 11.0*  HCT 41.0  38.0* 35.6* 32.7* 34.2*  MCV  --   --  86.4 83.4 84.4  PLT  --   --  314 302 304   Basic Metabolic Panel: Recent Labs  Lab 11/10/23 2137 11/11/23 0222 11/11/23 0319 11/11/23 0917 11/11/23 1757 11/12/23 0449  NA 136 138 138  --  135 135  K 3.6 3.6 3.5  --  4.0 4.0  CL 105 109 108  --  107 103  CO2 15* 20* 19*  --  16* 15*  GLUCOSE 444* 161* 137*  --  254* 299*   BUN 37* 33* 34*  --  34* 38*  CREATININE 2.02* 1.43* 1.51*  --  1.47* 1.31*  CALCIUM 8.6* 8.2* 8.6*  --  8.5* 8.5*  MG  --   --   --  1.8  --  2.0  PHOS  --   --   --  <1.0*  --  2.1*   GFR: Estimated Creatinine Clearance: 94.5 mL/min (A) (by C-G formula based on SCr of 1.31 mg/dL (H)). Liver Function Tests: Recent Labs  Lab 11/10/23 1922 11/12/23 0449  AST 15 15  ALT 12 10  ALKPHOS 102 76  BILITOT 1.6* 0.8  PROT 7.0 6.5  ALBUMIN 2.0* 1.8*   No results for input(s): "LIPASE", "AMYLASE" in the last 168 hours. No results for input(s): "AMMONIA" in the last 168 hours. Coagulation Profile: No results for input(s): "INR", "PROTIME" in the last 168 hours. Cardiac Enzymes: Recent Labs  Lab 11/10/23 1922  CKTOTAL 88   BNP (last 3 results) No results for input(s): "PROBNP" in the last 8760 hours. HbA1C: Recent Labs    11/11/23 0319  HGBA1C 9.1*   CBG: Recent Labs  Lab 11/11/23 1144 11/11/23 1546 11/11/23 2124 11/12/23 0855 11/12/23 1220  GLUCAP 140* 232* 243* 301* 291*   Lipid Profile: Recent Labs    11/11/23 0319  CHOL 153  HDL 60  LDLCALC 81  TRIG 61  CHOLHDL 2.6   Thyroid Function Tests: No results for input(s): "TSH", "T4TOTAL", "FREET4", "T3FREE", "THYROIDAB" in the last 72 hours. Anemia Panel: No results for input(s): "VITAMINB12", "FOLATE", "FERRITIN", "TIBC", "IRON", "RETICCTPCT" in the last 72 hours. Urine analysis:    Component Value Date/Time   COLORURINE YELLOW 11/11/2023 1852   APPEARANCEUR HAZY (A) 11/11/2023 1852   APPEARANCEUR Clear 06/25/2023 1537   LABSPEC 1.029 11/11/2023 1852   PHURINE 6.0 11/11/2023 1852   GLUCOSEU 50 (A) 11/11/2023 1852   HGBUR SMALL (A) 11/11/2023 1852   BILIRUBINUR NEGATIVE 11/11/2023 1852   BILIRUBINUR Negative 06/25/2023 1537   KETONESUR 20 (A) 11/11/2023 1852   PROTEINUR >=300 (A) 11/11/2023 1852   UROBILINOGEN 0.2 11/14/2021 1146   NITRITE NEGATIVE 11/11/2023 1852   LEUKOCYTESUR NEGATIVE 11/11/2023  1852   Sepsis Labs: @LABRCNTIP (procalcitonin:4,lacticidven:4)  ) Recent Results (from the past 240 hours)  Resp panel by RT-PCR (RSV, Flu A&B, Covid) Urine, Clean Catch     Status: None   Collection Time: 11/10/23  3:55 PM   Specimen: Urine, Clean Catch; Nasal Swab  Result Value Ref Range Status   SARS Coronavirus 2 by RT PCR NEGATIVE NEGATIVE Final   Influenza A by PCR NEGATIVE NEGATIVE Final   Influenza B by PCR NEGATIVE NEGATIVE Final    Comment: (NOTE) The Xpert Xpress SARS-CoV-2/FLU/RSV plus assay is intended as an aid in the diagnosis of influenza from Nasopharyngeal swab specimens and should not be used as a sole basis for treatment. Nasal washings and aspirates are unacceptable for Xpert Xpress  SARS-CoV-2/FLU/RSV testing.  Fact Sheet for Patients: BloggerCourse.com  Fact Sheet for Healthcare Providers: SeriousBroker.it  This test is not yet approved or cleared by the [place] FDA and has been authorized for detection and/or diagnosis of SARS-CoV-2 by FDA under an Emergency Use Authorization (EUA). This EUA will remain in effect (meaning this test can be used) for the duration of the COVID-19 declaration under Section 564(b)(1) of the Act, 21 U.S.C. section 360bbb-3(b)(1), unless the authorization is terminated or revoked.     Resp Syncytial Virus by PCR NEGATIVE NEGATIVE Final    Comment: (NOTE) Fact Sheet for Patients: BloggerCourse.com  Fact Sheet for Healthcare Providers: SeriousBroker.it  This test is not yet approved or cleared by the [place] FDA and has been authorized for detection and/or diagnosis of SARS-CoV-2 by FDA under an Emergency Use Authorization (EUA). This EUA will remain in effect (meaning this test can be used) for the duration of the COVID-19 declaration under Section 564(b)(1) of the Act, 21 U.S.C. section 360bbb-3(b)(1), unless  the authorization is terminated or revoked.  Performed at [facility], [address] [place], [place] [address]   Urine Culture (for pregnant, neutropenic or urologic patients or patients with an indwelling urinary catheter)     Status: Abnormal   Collection Time: 11/11/23  2:34 AM   Specimen: Urine, Clean Catch  Result Value Ref Range Status   Specimen Description URINE, CLEAN CATCH  Final   Special Requests   Final    NONE Performed at [facility], [address] [place], [place] [address]    Culture MULTIPLE SPECIES PRESENT, SUGGEST RECOLLECTION (A)  Final   Report Status 11/12/2023 FINAL  Final  Culture, blood (Routine X 2) w Reflex to ID Panel     Status: None (Preliminary result)   Collection Time: 11/11/23  3:08 AM   Specimen: BLOOD LEFT HAND  Result Value Ref Range Status   Specimen Description BLOOD LEFT HAND  Final   Special Requests   Final    BOTTLES DRAWN AEROBIC AND ANAEROBIC Blood Culture adequate volume   Culture   Final    NO GROWTH 1 DAY Performed at [facility], [address], [place], [place] [address]    Report Status PENDING  Incomplete  Culture, blood (Routine X 2) w Reflex to ID Panel     Status: None (Preliminary result)   Collection Time: 11/11/23  3:19 AM   Specimen: BLOOD LEFT HAND  Result Value Ref Range Status   Specimen Description BLOOD LEFT HAND  Final   Special Requests   Final    BOTTLES DRAWN AEROBIC AND ANAEROBIC Blood Culture adequate volume   Culture   Final    NO GROWTH 1 DAY Performed at [facility], [address], [place], [place] [address]    Report Status PENDING  Incomplete  MRSA Next Gen by PCR, Nasal     Status: Abnormal   Collection Time: 11/11/23  4:36 AM   Specimen: Nasal Mucosa; Nasal Swab  Result Value Ref Range Status   MRSA by PCR Next Gen DETECTED (A) NOT DETECTED Final    Comment: RESULT CALLED TO, READ BACK BY AND VERIFIED WITH: B WARNER,RN@[id number]  11/11/23 MK (NOTE) The GeneXpert  MRSA Assay (FDA approved for NASAL specimens only), is one component of a comprehensive MRSA colonization surveillance program. It is not intended to diagnose MRSA infection nor to guide or monitor treatment for MRSA infections. Test performance is not FDA  approved in patients less than 49 years old. Performed at [facility], [address] [place], [place] [address]      Radiology Studies: MR FOOT LEFT W WO CONTRAST Result Date: 11/12/2023 CLINICAL DATA:  Soft tissue infection, plantar wound, diabetes EXAM: MRI OF THE LEFT HINDFOOT WITHOUT AND WITH CONTRAST TECHNIQUE: Multiplanar, multisequence MR imaging of the ankle was performed. No intravenous contrast was administered. COMPARISON:  08/02/2018 FINDINGS: TENDONS Peroneal: Distal peroneus brevis indistinct, only very small ossific structure of the base of the fifth metatarsal is still present, the rest of the fifth ray is absent. Posteromedial: Distal tibialis posterior tenosynovitis and mild tendinopathy. Flexor hallucis longus tenosynovitis at the level of the knot of Henry. Anterior: Unremarkable Achilles: Moderate distal Achilles tendinopathy with expansion and increased signal in the tendon for example, image 12 series 3. Plantar Fascia: Mild thickening and accentuated T2 signal in the medial band the plantar fascia proximally favoring mild plantar fasciitis. LIGAMENTS Lateral: Attenuated anterior talofibular ligament, query remote tear. Medial: Grossly intact. CARTILAGE Ankle Joint: Unremarkable Subtalar Joints/Sinus Tarsi: Unremarkable Bones: Moderate Charcot arthropathy with extensive spurring in the midfoot and Lisfranc joint. No compelling findings of osteomyelitis. Other: 2.5 cm plantar ulceration below the cuboid. This extends towards but does not definitively reach the plantar fascia. Diffuse subcutaneous edema in the ankle and tracking in the forefoot. A component of cellulitis is not excluded. No drainable abscess identified.  Low-grade regional intramuscular edema, likely neurogenic. IMPRESSION: 1. 2.5 cm plantar ulceration below the cuboid. This extends towards but does not definitively reach the plantar fascia. No osteomyelitis. 2. Diffuse subcutaneous edema in the ankle and tracking in the forefoot. A component of cellulitis is not excluded. No drainable abscess identified. 3. Moderate Charcot arthropathy with extensive spurring in the midfoot and Lisfranc joint. 4. Moderate distal Achilles tendinopathy. 5. Mild plantar fasciitis. 6. Attenuated anterior talofibular ligament, query remote tear. 7. Low-grade regional intramuscular edema, likely neurogenic. 8. Distal tibialis posterior tenosynovitis and mild tendinopathy. 9. Flexor hallucis longus tenosynovitis at the level of the knot of Henry. 10. Absent fifth ray beyond a small bony fragment of the base of the fifth metatarsal. Electronically Signed   By: Gaylyn Rong M.D.   On: 11/12/2023 08:32   VAS [place] LOWER EXTREMITY VENOUS (DVT) Result Date: 11/11/2023  Lower Venous DVT Study Patient Name:  Taylor Hughes  Date of Exam:   11/11/2023 Medical Rec #: [id number]     Accession #:    [id number] Date of Birth: May 22, 1971      Patient Gender: M Patient Age:   44 years Exam Location:  [facility] Procedure:      VAS [place] LOWER EXTREMITY VENOUS (DVT) Referring Phys: Jonny Ruiz DOUTOVA --------------------------------------------------------------------------------  Indications: Pain, and Swelling.  Risk Factors: Obesity. Limitations: Body habitus. Comparison Study: No significant changes seen since previous exam 11/22/18. Performing Technologist: Shona Simpson  Examination Guidelines: A complete evaluation includes B-mode imaging, spectral Doppler, color Doppler, and power Doppler as needed of all accessible portions of each vessel. Bilateral testing is considered an integral part of a complete examination. Limited examinations for reoccurring indications may be performed as noted.  The reflux portion of the exam is performed with the patient in reverse Trendelenburg.  +-----+---------------+---------+-----------+----------+--------------+ RIGHTCompressibilityPhasicitySpontaneityPropertiesThrombus Aging +-----+---------------+---------+-----------+----------+--------------+ CFV  Full           Yes      Yes                                 +-----+---------------+---------+-----------+----------+--------------+   +---------+---------------+---------+-----------+----------+-------------------+  LEFT     CompressibilityPhasicitySpontaneityPropertiesThrombus Aging      +---------+---------------+---------+-----------+----------+-------------------+ CFV      Full           Yes      Yes                                      +---------+---------------+---------+-----------+----------+-------------------+ SFJ      Full                                                             +---------+---------------+---------+-----------+----------+-------------------+ FV Prox  Full                                                             +---------+---------------+---------+-----------+----------+-------------------+ FV Mid   Full                                                             +---------+---------------+---------+-----------+----------+-------------------+ FV DistalFull                    Yes                                      +---------+---------------+---------+-----------+----------+-------------------+ PFV      Full                                                             +---------+---------------+---------+-----------+----------+-------------------+ POP      Full           Yes      Yes                                      +---------+---------------+---------+-----------+----------+-------------------+ PTV      Full                    Yes                                       +---------+---------------+---------+-----------+----------+-------------------+ PERO                             Yes                  Not well visualized +---------+---------------+---------+-----------+----------+-------------------+    Summary: RIGHT: - No evidence of common femoral vein obstruction.   LEFT: - There is  no evidence of deep vein thrombosis in the lower extremity.  - No cystic structure found in the popliteal fossa.  *See table(s) above for measurements and observations. Electronically signed by Coral Else MD on 11/11/2023 at 6:57:35 PM.    Final    ECHOCARDIOGRAM COMPLETE Result Date: 11/11/2023    ECHOCARDIOGRAM REPORT   Patient Name:   MACKIE HOLNESS Date of Exam: 11/11/2023 Medical Rec #:  [id number]    Height:       71.0 in Accession #:    [id number]   Weight:       283.5 lb Date of Birth:  08-30-1970     BSA:          2.446 m Patient Age:    52 years     BP:           130/70 mmHg Patient Gender: M            HR:           134 bpm. Exam Location:  Inpatient Procedure: 2D Echo, Color Doppler, Cardiac Doppler and Intracardiac            Opacification Agent (Both Spectral and Color Flow Doppler were            utilized during procedure). Indications:    Elevated Troponin  History:        Patient has prior history of Echocardiogram examinations, most                 recent 03/24/2023. Risk Factors:Diabetes, Hypertension and                 Dyslipidemia.  Sonographer:    Harriette Bouillon RDCS Referring Phys: [id number] ANASTASSIA DOUTOVA IMPRESSIONS  1. Left ventricular ejection fraction, by estimation, is 60 to 65%. The left ventricle has normal function. The left ventricle has no regional wall motion abnormalities. There is mild left ventricular hypertrophy. Left ventricular diastolic parameters are indeterminate.  2. Right ventricular systolic function is normal. The right ventricular size is mildly enlarged. Tricuspid regurgitation signal is inadequate for assessing PA pressure.  3. The mitral valve  is normal in structure. Trivial mitral valve regurgitation.  4. The aortic valve is tricuspid. Aortic valve regurgitation is not visualized. No aortic stenosis is present.  5. The inferior vena cava is normal in size with greater than 50% respiratory variability, suggesting right atrial pressure of 3 mmHg. FINDINGS  Left Ventricle: Left ventricular ejection fraction, by estimation, is 60 to 65%. The left ventricle has normal function. The left ventricle has no regional wall motion abnormalities. Definity contrast agent was given IV to delineate the left ventricular  endocardial borders. The left ventricular internal cavity size was normal in size. There is mild left ventricular hypertrophy. Left ventricular diastolic parameters are indeterminate. Right Ventricle: The right ventricular size is mildly enlarged. No increase in right ventricular wall thickness. Right ventricular systolic function is normal. Tricuspid regurgitation signal is inadequate for assessing PA pressure. Left Atrium: Left atrial size was normal in size. Right Atrium: Right atrial size was normal in size. Pericardium: There is no evidence of pericardial effusion. Mitral Valve: The mitral valve is normal in structure. Trivial mitral valve regurgitation. Tricuspid Valve: The tricuspid valve is normal in structure. Tricuspid valve regurgitation is trivial. Aortic Valve: The aortic valve is tricuspid. Aortic valve regurgitation is not visualized. No aortic stenosis is present. Pulmonic Valve: The pulmonic valve was not well visualized. Pulmonic valve regurgitation is not visualized. Aorta: The aortic  root and ascending aorta are structurally normal, with no evidence of dilitation. Venous: The inferior vena cava is normal in size with greater than 50% respiratory variability, suggesting right atrial pressure of 3 mmHg. IAS/Shunts: The interatrial septum was not well visualized.  LEFT VENTRICLE PLAX 2D LVIDd:         4.30 cm LVIDs:         2.80 cm LV  PW:         1.30 cm LV IVS:        1.30 cm LVOT diam:     2.20 cm LV SV:         62 LV SV Index:   25 LVOT Area:     3.80 cm  RIGHT VENTRICLE             IVC RV S prime:     17.50 cm/s  IVC diam: 1.90 cm TAPSE (M-mode): 1.8 cm LEFT ATRIUM             Index        RIGHT ATRIUM           Index LA diam:        3.80 cm 1.55 cm/m   RA Area:     18.70 cm LA Vol (A2C):   57.0 ml 23.31 ml/m  RA Volume:   49.30 ml  20.16 ml/m LA Vol (A4C):   72.4 ml 29.60 ml/m LA Biplane Vol: 65.5 ml 26.78 ml/m  AORTIC VALVE LVOT Vmax:   112.00 cm/s LVOT Vmean:  71.400 cm/s LVOT VTI:    0.162 m  AORTA Ao Root diam: 2.60 cm Ao Asc diam:  2.90 cm  SHUNTS Systemic VTI:  0.16 m Systemic Diam: 2.20 cm Epifanio Lesches MD Electronically signed by Epifanio Lesches MD Signature Date/Time: 11/11/2023/12:07:16 PM    Final    CT RENAL STONE STUDY Result Date: 11/10/2023 CLINICAL DATA:  Abdominal/flank pain, stone suspected EXAM: CT ABDOMEN AND PELVIS WITHOUT CONTRAST TECHNIQUE: Multidetector CT imaging of the abdomen and pelvis was performed following the standard protocol without IV contrast. RADIATION DOSE REDUCTION: This exam was performed according to the departmental dose-optimization program which includes automated exposure control, adjustment of the mA and/or kV according to patient size and/or use of iterative reconstruction technique. COMPARISON:  08/04/2006 FINDINGS: Lower chest: No acute abnormality. Hepatobiliary: No focal hepatic abnormality. Gallbladder unremarkable. Pancreas: No focal abnormality or ductal dilatation. Spleen: No focal abnormality.  Normal size. Adrenals/Urinary Tract: 2.1 cm nodule in the right adrenal gland, not diagnostic of adenoma based on density. Left adrenal gland normal. Mild bilateral perinephric stranding. No ureteral or renal stones. No hydronephrosis. Urinary bladder unremarkable. Stomach/Bowel: Stomach, large and small bowel grossly unremarkable. Vascular/Lymphatic: Aortic atherosclerosis. No  evidence of aneurysm or adenopathy. Reproductive: No visible focal abnormality. Other: No free fluid or free air. Musculoskeletal: No acute bony abnormality. IMPRESSION: Bilateral perinephric stranding without stones or hydronephrosis. This could be related to prior insult or acute inflammation. Recommend clinical correlation to completely exclude pyelonephritis. Aortic atherosclerosis. 2.1 cm indeterminate right adrenal nodule. This could be further evaluated with nonemergent outpatient MRI if felt clinically indicated. Electronically Signed   By: Charlett Nose M.D.   On: 11/10/2023 23:11   DG Chest Portable 1 View Result Date: 11/10/2023 CLINICAL DATA:  DKA.  Shortness of breath. EXAM: PORTABLE CHEST 1 VIEW COMPARISON:  03/25/2023. FINDINGS: Bilateral lung fields are clear. Bilateral costophrenic angles are clear. Note is made of elevated right hemidiaphragm. Stable cardio-mediastinal silhouette. No acute  osseous abnormalities. The soft tissues are within normal limits. IMPRESSION: *No active disease. Electronically Signed   By: Jules Schick M.D.   On: 11/10/2023 17:34     Scheduled Meds:  atorvastatin  20 mg Oral Daily   Chlorhexidine Gluconate Cloth  6 each Topical Daily   enoxaparin (LOVENOX) injection  40 mg Subcutaneous Q24H   gabapentin  300 mg Oral BID   insulin aspart  0-15 Units Subcutaneous TID WC   insulin aspart  0-5 Units Subcutaneous QHS   insulin aspart  3 Units Subcutaneous TID WC   insulin glargine-yfgn  20 Units Subcutaneous Daily   metoprolol tartrate  50 mg Oral BID   multivitamin with minerals  1 tablet Oral Daily   mupirocin ointment  1 Application Nasal BID   nutrition supplement (JUVEN)  1 packet Oral BID BM   phosphorus  500 mg Oral BID   thiamine  100 mg Oral Daily   Continuous Infusions:  cefTRIAXone (ROCEPHIN)  IV 1 g (11/12/23 0140)   vancomycin       LOS: 1 day     Huey Bienenstock , MD [facility]   11/12/2023, 12:59 PM

## 2023-11-12 NOTE — Progress Notes (Signed)
 Patient transferred to [phone] without complications.

## 2023-11-12 NOTE — Plan of Care (Signed)

## 2023-11-13 ENCOUNTER — Other Ambulatory Visit: Payer: Self-pay

## 2023-11-13 DIAGNOSIS — M14672 Charcot's joint, left ankle and foot: Secondary | ICD-10-CM | POA: Diagnosis not present

## 2023-11-13 DIAGNOSIS — L97521 Non-pressure chronic ulcer of other part of left foot limited to breakdown of skin: Secondary | ICD-10-CM

## 2023-11-13 DIAGNOSIS — E1110 Type 2 diabetes mellitus with ketoacidosis without coma: Secondary | ICD-10-CM | POA: Diagnosis not present

## 2023-11-13 LAB — BASIC METABOLIC PANEL WITH GFR
Anion gap: 11 (ref 5–15)
BUN: 25 mg/dL — ABNORMAL HIGH (ref 6–20)
CO2: 19 mmol/L — ABNORMAL LOW (ref 22–32)
Calcium: 8.3 mg/dL — ABNORMAL LOW (ref 8.9–10.3)
Chloride: 105 mmol/L (ref 98–111)
Creatinine, Ser: 1.03 mg/dL (ref 0.61–1.24)
GFR, Estimated: >60 mL/min (ref >60)
Glucose, Bld: 249 mg/dL — ABNORMAL HIGH (ref 70–99)
Potassium: 3.3 mmol/L — ABNORMAL LOW (ref 3.5–5.1)
Sodium: 135 mmol/L (ref 135–145)

## 2023-11-13 LAB — CBC
HCT: 33.5 % — ABNORMAL LOW (ref 39.0–52.0)
Hemoglobin: 11 g/dL — ABNORMAL LOW (ref 13.0–17.0)
MCH: 27.4 pg (ref 26.0–34.0)
MCHC: 32.8 g/dL (ref 30.0–36.0)
MCV: 83.3 fL (ref 80.0–100.0)
Platelets: 324 10*3/uL (ref 150–400)
RBC: 4.02 MIL/uL — ABNORMAL LOW (ref 4.22–5.81)
RDW: 18 % — ABNORMAL HIGH (ref 11.5–15.5)
WBC: 12 10*3/uL — ABNORMAL HIGH (ref 4.0–10.5)
nRBC: 0 % (ref 0.0–0.2)

## 2023-11-13 LAB — GLUCOSE, CAPILLARY
Glucose-Capillary: 259 mg/dL — ABNORMAL HIGH (ref 70–99)
Glucose-Capillary: 206 mg/dL — ABNORMAL HIGH (ref 70–99)
Glucose-Capillary: 158 mg/dL — ABNORMAL HIGH (ref 70–99)
Glucose-Capillary: 173 mg/dL — ABNORMAL HIGH (ref 70–99)

## 2023-11-13 LAB — PHOSPHORUS: Phosphorus: 2.2 mg/dL — ABNORMAL LOW (ref 2.5–4.6)

## 2023-11-13 LAB — MAGNESIUM: Magnesium: 2 mg/dL (ref 1.7–2.4)

## 2023-11-13 LAB — BRAIN NATRIURETIC PEPTIDE: B Natriuretic Peptide: 242 pg/mL — ABNORMAL HIGH (ref 0.0–100.0)

## 2023-11-13 MED ORDER — OXYCODONE HCL 5 MG PO TABS
5.0000 mg | ORAL_TABLET | ORAL | Status: DC | PRN
  Administered 2023-11-13 – 2023-11-17 (×16): 5 mg via ORAL
  Filled 2023-11-13 (×16): qty 1

## 2023-11-13 MED ORDER — INSULIN GLARGINE-YFGN 100 UNIT/ML ~~LOC~~ SOLN
25.0000 [IU] | Freq: Every day | SUBCUTANEOUS | Status: DC
  Administered 2023-11-13 – 2023-11-16 (×4): 25 [IU] via SUBCUTANEOUS
  Filled 2023-11-13 (×4): qty 0.25

## 2023-11-13 MED ORDER — POTASSIUM CHLORIDE CRYS ER 20 MEQ PO TBCR
40.0000 meq | EXTENDED_RELEASE_TABLET | Freq: Four times a day (QID) | ORAL | Status: AC
  Administered 2023-11-13 – 2023-11-14 (×2): 40 meq via ORAL
  Filled 2023-11-13 (×2): qty 2

## 2023-11-13 MED ORDER — K PHOS MONO-SOD PHOS DI & MONO 155-852-130 MG PO TABS
500.0000 mg | ORAL_TABLET | Freq: Two times a day (BID) | ORAL | Status: DC
  Administered 2023-11-14 – 2023-11-17 (×7): 500 mg via ORAL
  Filled 2023-11-13 (×7): qty 2

## 2023-11-13 MED ORDER — K PHOS MONO-SOD PHOS DI & MONO 155-852-130 MG PO TABS
500.0000 mg | ORAL_TABLET | Freq: Three times a day (TID) | ORAL | Status: AC
  Administered 2023-11-13 (×2): 500 mg via ORAL
  Filled 2023-11-13 (×2): qty 2

## 2023-11-13 NOTE — Plan of Care (Signed)

## 2023-11-13 NOTE — Progress Notes (Signed)
 Orthopedic Tech Progress Note Patient Details:  Taylor Hughes 02-08-1971 [id number]  Ortho Devices Type of Ortho Device: Prafo boot/shoe Ortho Device/Splint Location: LLE Ortho Device/Splint Interventions: Ordered, Application, Adjustment   Post Interventions Patient Tolerated: Well Instructions Provided: Care of device, Adjustment of device  Tonye Pearson 11/13/2023, 2:58 PM

## 2023-11-13 NOTE — Progress Notes (Signed)
  Inpatient Rehab Admissions Coordinator :  Per therapy recommendations patient was screened for CIR candidacy by Ottie Glazier RN MSN. Patient is not at a level to tolerate the intensity required to pursue a CIR admit. Bed level assessment, patient refused OOB. The CIR admissions team will follow and monitor for progress and place a Rehab Consult order if felt to be appropriate. Please contact me with any questions.  Ottie Glazier RN MSN Admissions Coordinator [phone]

## 2023-11-13 NOTE — Evaluation (Signed)
 Occupational Therapy Evaluation Patient Details Name: Taylor Hughes MRN: [id number] DOB: 04-22-1971 Today's Date: 11/13/2023   History of Present Illness   53 year old make admitted with weakness, elevated BS and nausea and vomiting. He had not been taking his insulin or checking his BS. Also found a L foot diabetic ulcer. PMH includes R forefoot amputation , lymphedema and morbid obesity     Clinical Impressions Prior to admission patient was independent with his adls/iadls and driving.  Currently is +2 Max A with bed mobility and refusing OOB.  Patient is very anxious and stated he is scared to get OOB.   He has good Upper body strength and was able to use LE for rolling.  Feel patient should improve rather quickly with mobility, but if not would recommend intensive inpatient follow-up therapy, >3 hours/day.  Will follow on acute to facilitate d/c     If plan is discharge home, recommend the following:   Two people to help with bathing/dressing/bathroom;Two people to help with walking and/or transfers;Assist for transportation;Help with stairs or ramp for entrance     Functional Status Assessment   Patient has had a recent decline in their functional status and demonstrates the ability to make significant improvements in function in a reasonable and predictable amount of time.     Equipment Recommendations   None recommended by OT     Recommendations for Other Services         Precautions/Restrictions   Precautions Precautions: Fall Recall of Precautions/Restrictions: Intact Restrictions Weight Bearing Restrictions Per Provider Order: No     Mobility Bed Mobility Overal bed mobility: Needs Assistance Bed Mobility: Rolling Rolling: +2 for physical assistance, Max assist         General bed mobility comments: Able to use BUE to assist with rolling and RLE    Transfers                   General transfer comment: Patient refused OOB      Balance                                            ADL either performed or assessed with clinical judgement   ADL Overall ADL's : Needs assistance/impaired Eating/Feeding: Set up   Grooming: Set up   Upper Body Bathing: Maximal assistance   Lower Body Bathing: +2 for physical assistance;Total assistance   Upper Body Dressing : Maximal assistance   Lower Body Dressing: +2 for physical assistance;Maximal assistance               Functional mobility during ADLs: +2 for physical assistance;Maximal assistance General ADL Comments: Patient was very fearful during the assessment.     Vision Baseline Vision/History: 0 No visual deficits       Perception         Praxis         Pertinent Vitals/Pain Pain Assessment Pain Assessment: 0-10 Pain Score: 10-Worst pain ever Pain Location: LLE - down the entire side Pain Descriptors / Indicators: Grimacing, Moaning, Discomfort Pain Intervention(s): Limited activity within patient's tolerance     Extremity/Trunk Assessment Upper Extremity Assessment Upper Extremity Assessment: Overall WFL for tasks assessed   Lower Extremity Assessment Lower Extremity Assessment: Defer to PT evaluation   Cervical / Trunk Assessment Cervical / Trunk Assessment: Normal;Other exceptions (morbidly obese)   Communication Communication Communication: No apparent  difficulties   Cognition Arousal: Alert Behavior During Therapy: Anxious, Flat affect                                 Following commands: Intact       Cueing  General Comments   Cueing Techniques: Verbal cues  Significant edema in BLE   Exercises     Shoulder Instructions      Home Living Family/patient expects to be discharged to:: Private residence Living Arrangements: Spouse/significant other;Other relatives Available Help at Discharge: Family Type of Home: House Home Access: Stairs to enter [employer] of Steps: 2 in the front,  but can access on the side withno steps Entrance Stairs-Rails: Can reach both Home Layout: One level     Bathroom Shower/Tub: Tub/shower unit;Curtain   Firefighter: Standard Bathroom Accessibility: Yes   Home Equipment: Agricultural consultant (2 wheels);Cane - single point;Grab bars - tub/shower          Prior Functioning/Environment Prior Level of Function : Independent/Modified Independent;Driving             Mobility Comments: independent ADLs Comments: Independent on disability    OT Problem List: Decreased activity tolerance;Impaired balance (sitting and/or standing);Obesity;Pain   OT Treatment/Interventions: Self-care/ADL training;Therapeutic activities;Patient/family education;Balance training;DME and/or AE instruction      OT Goals(Current goals can be found in the care plan section)   Acute Rehab OT Goals OT Goal Formulation: With patient Time For Goal Achievement: 11/27/23 Potential to Achieve Goals: Good   OT Frequency:  Min 2X/week    Co-evaluation              AM-PAC OT "6 Clicks" Daily Activity     Outcome Measure Help from another person eating meals?: None Help from another person taking care of personal grooming?: None Help from another person toileting, which includes using toliet, bedpan, or urinal?: Total Help from another person bathing (including washing, rinsing, drying)?: Total Help from another person to put on and taking off regular upper body clothing?: Total Help from another person to put on and taking off regular lower body clothing?: Total 6 Click Score: 12   End of Session Nurse Communication: Mobility status (pain level)  Activity Tolerance: Patient limited by pain Patient left: in bed;with call bell/phone within reach;with bed alarm set  OT Visit Diagnosis: Pain;Other abnormalities of gait and mobility (R26.89);Unsteadiness on feet (R26.81) Pain - Right/Left: Left Pain - part of body: Leg                Time:  1610-9604 OT Time Calculation (min): 27 min Charges:  OT General Charges $OT Visit: 1 Visit OT Evaluation $OT Eval Moderate Complexity: 1 Mod OT Treatments $Self Care/Home Management : 8-22 mins  Taylor Hughes OTR/L   [name] 11/13/2023, 3:51 PM

## 2023-11-13 NOTE — Consult Note (Signed)
 ORTHOPAEDIC CONSULTATION  REQUESTING PHYSICIAN: Elgergawy, Leana Roe, MD  Chief Complaint: Ulceration left heel.  HPI: Taylor Hughes is a 53 y.o. 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  Patient has a ulcer beneath the Charcot rocker-bottom deformity.  Past Medical History:  Diagnosis Date   Atrial fibrillation (HCC)    Diabetes mellitus without complication (HCC)    Type 2   GERD (gastroesophageal reflux disease)    Hyperlipidemia    Hypertension    Morbid (severe) obesity due to excess calories (HCC) 08/07/2023   bmi 44.63   Wears glasses    Wound, open, foot    left diabetic    Past Surgical History:  Procedure Laterality Date   AMPUTATION Left 08/20/2018   Procedure: LEFT FOOT IRRIGATON AND DEBRIDEMENT, 5TH RAY AMPUTATION;  Surgeon: Nadara Mustard, MD;  Location: [facility] OR;  Service: Orthopedics;  Laterality: Left;   AMPUTATION Right 03/25/2023   Procedure: RIGHT TRANSMETATARSAL AMPUTATION;  Surgeon: Nadara Mustard, MD;  Location: [facility] OR;  Service: Orthopedics;  Laterality: Right;   APPLICATION OF A-CELL OF CHEST/ABDOMEN N/A 05/30/2019   Procedure: Application Of A-Cell Of Chest;  Surgeon: Corliss Skains, MD;  Location: [facility] OR;  Service: Cardiothoracic;  Laterality: N/A;   APPLICATION OF WOUND VAC N/A 05/30/2019   Procedure: Application Of Wound Vac;  Surgeon: Corliss Skains, MD;  Location: [facility] OR;  Service: Cardiothoracic;  Laterality: N/A;   FINGER SURGERY Right 2016   I&D  small finger   I & D EXTREMITY Left 07/30/2018   Procedure: IRRIGATION AND DEBRIDEMENT LEFT FOOT WITH POSSIBLE AMPUTATION OF FIFTH TOE;  Surgeon: Kathryne Hitch, MD;  Location: [facility] ORS;  Service: Orthopedics;  Laterality: Left;   INCISION AND DRAINAGE ABSCESS Left 01/19/2019   Procedure: INCISION AND DRAINAGE ABSCESS upper chest;  Surgeon: Christia Reading, MD;  Location: [facility] OR;  Service: ENT;  Laterality: Left;   IRRIGATION AND  DEBRIDEMENT STERNOCLAVICULAR JOINT-STERNUM AND RIBS N/A 05/30/2019   Procedure: IRRIGATION AND DEBRIDEMENT OF STERNOCLAVICULAR JOINT-STERNUM AND RIBS ;  Surgeon: Corliss Skains, MD;  Location: [facility] OR;  Service: Cardiothoracic;  Laterality: N/A;   MINOR IRRIGATION AND DEBRIDEMENT OF WOUND N/A 11/22/2018   Procedure: INCISION AND DRAINAGE OF NECK ABSCESS;  Surgeon: Christia Reading, MD;  Location: [facility] ORS;  Service: ENT;  Laterality: N/A;   Social History   Socioeconomic History   Marital status: Divorced    Spouse name: Not on file   Number of children: Not on file   Years of education: Not on file   Highest education level: 11th grade  Occupational History   Not on file  Tobacco Use   Smoking status: Never   Smokeless tobacco: Never  Vaping Use   Vaping status: Never Used  Substance and Sexual Activity   Alcohol use: No    Alcohol/week: 0.0 standard drinks of alcohol   Drug use: No   Sexual activity: Not on file  Other Topics Concern   Not on file  Social History Narrative   Not on file   Social Drivers of Health   Financial Resource Strain: Medium Risk (06/24/2023)   Overall Financial Resource Strain (CARDIA)    Difficulty of Paying Living Expenses: Somewhat hard  Food Insecurity: Food Insecurity Present (11/12/2023)   Hunger Vital Sign    Worried About Running Out of Food in the Last Year: Sometimes true    Ran Out of Food in the Last Year:  Sometimes true  Transportation Needs: No Transportation Needs (11/12/2023)   PRAPARE - Administrator, Civil Service (Medical): No    Lack of Transportation (Non-Medical): No  Physical Activity: Unknown (06/24/2023)   Exercise Vital Sign    Days of Exercise per Week: 0 days    Minutes of Exercise per Session: Not on file  Stress: No Stress Concern Present (06/24/2023)   [employer] of Occupational Health - Occupational Stress Questionnaire    Feeling of Stress : Only a little  Social Connections: Socially Isolated  (06/24/2023)   Social Connection and Isolation Panel [NHANES]    Frequency of Communication with Friends and Family: Twice a week    Frequency of Social Gatherings with Friends and Family: Once a week    Attends Religious Services: Never    Database administrator or Organizations: No    Attends Engineer, structural: Not on file    Marital Status: Divorced   Family History  Problem Relation Age of Onset   Diabetes Mother    Colon cancer Brother    Esophageal cancer Neg Hx    Rectal cancer Neg Hx    Stomach cancer Neg Hx    - negative except otherwise stated in the family history section No Known Allergies Prior to Admission medications   Medication Sig Start Date End Date Taking? Authorizing Provider  Accu-Chek Softclix Lancets lancets Use to check blood sugar 3 times daily. 06/02/23  Yes Hoy Register, MD  atorvastatin (LIPITOR) 20 MG tablet Take 1 tablet (20 mg total) by mouth daily. 01/19/23  Yes Hoy Register, MD  Blood Glucose Monitoring Suppl (ACCU-CHEK GUIDE) w/Device KIT Use to check blood sugar 3 times daily. 06/02/23  Yes Hoy Register, MD  gabapentin (NEURONTIN) 300 MG capsule Take 1 capsule (300 mg total) by mouth 2 (two) times daily. 10/06/23  Yes Newlin, Odette Horns, MD  glucose blood (ACCU-CHEK GUIDE TEST) test strip use as directed 3 (three) times daily. 06/02/23  Yes Newlin, Odette Horns, MD  glucose blood (ACCU-CHEK GUIDE) test strip Use to check blood sugar 3 times daily. 06/02/23  Yes Newlin, Odette Horns, MD  insulin glargine (LANTUS SOLOSTAR) 100 UNIT/ML Solostar Pen Inject 10 Units into the skin daily. 09/25/23  Yes Newlin, Odette Horns, MD  insulin lispro (HUMALOG KWIKPEN) 100 UNIT/ML KwikPen Inject 0-12 units into the skin three times daily with meals. Per sliding scale. 07/22/23  Yes Hoy Register, MD  Insulin Pen Needle 32G X 6 MM MISC USE AS DIRECTED 11/17/22  Yes Newlin, Enobong, MD  Menthol, Topical Analgesic, (ICY HOT BACK) 5 % PTCH Apply 1 patch topically daily.    Yes [provider]  metoprolol tartrate (LOPRESSOR) 25 MG tablet Take 1 tablet (25 mg total) by mouth 2 (two) times daily. 05/14/23  Yes Hoy Register, MD  omeprazole (PRILOSEC) 40 MG capsule Take 1 capsule (40 mg total) by mouth daily. 01/19/23  Yes Hoy Register, MD  oxyCODONE (OXY IR/ROXICODONE) 5 MG immediate release tablet Take 1 tablet (5 mg total) by mouth every 8 (eight) hours. 09/24/23  Yes   sildenafil (VIAGRA) 50 MG tablet Take 1 tablet (50 mg total) by mouth daily as needed for erectile dysfunction. At least 24 hours between doses 10/23/23  Yes Newlin, Enobong, MD  valsartan (DIOVAN) 160 MG tablet Take 1 tablet (160 mg total) by mouth daily. 09/25/23  Yes Newlin, Odette Horns, MD  naloxone (NARCAN) nasal spray 4 mg/0.1 mL USE 1 SPRAY ON 1 NOSTRIL ONCE, MAY REPEAT DOSE EVERY 2  TO 3 MINUTES UNTIL REPSONSIVE OR EMS ARRIVE FOR AN OPIOID EMERGENCY. 11/10/23     furosemide (LASIX) 20 MG tablet Take 2 tablets (40 mg total) by mouth daily. Patient not taking: Reported on 08/07/2023 05/14/23 11/04/23  Hoy Register, MD   No results found. - pertinent xrays, CT, MRI studies were reviewed and independently interpreted  Positive ROS: All other systems have been reviewed and were otherwise negative with the exception of those mentioned in the HPI and as above.  Physical Exam: General: Alert, no acute distress Psychiatric: Patient is competent for consent with normal mood and affect Lymphatic: No axillary or cervical lymphadenopathy Cardiovascular: No pedal edema Respiratory: No cyanosis, no use of accessory musculature GI: No organomegaly, abdomen is soft and non-tender    Images:  @ENCIMAGES @  Labs:  Lab Results  Component Value Date   HGBA1C 9.1 (H) 11/11/2023   HGBA1C 7.9 (A) 06/25/2023   HGBA1C 9.8 (H) 03/23/2023   ESRSEDRATE >140 (H) 03/22/2023   CRP 9.7 (H) 03/29/2023   CRP 15.7 (H) 03/28/2023   CRP 19.2 (H) 03/27/2023   REPTSTATUS PENDING 11/11/2023   GRAMSTAIN NO  WBC SEEN RARE GRAM POSITIVE COCCI IN PAIRS  03/25/2023   CULT  11/11/2023    NO GROWTH 2 DAYS Performed at [facility], [address], [place], [place] [address]    [facility] STAPHYLOCOCCUS AUREUS 05/30/2019    Lab Results  Component Value Date   ALBUMIN 1.8 (L) 11/12/2023   ALBUMIN 2.0 (L) 11/10/2023   ALBUMIN 1.6 (L) 03/24/2023        Latest Ref Rng & Units 11/13/2023   11:17 AM 11/12/2023    4:49 AM 11/11/2023    3:19 AM  CBC EXTENDED  WBC 4.0 - 10.5 K/uL 12.0  15.0  15.1   RBC 4.22 - 5.81 MIL/uL 4.02  4.05  3.92   Hemoglobin 13.0 - 17.0 g/dL 42.5  95.6  38.7   HCT 39.0 - 52.0 % 33.5  34.2  32.7   Platelets 150 - 400 K/uL 324  304  302     Neurologic: Patient does not have protective sensation bilateral lower extremities.   MUSCULOSKELETAL:   Skin: Examination patient has a stable Charcot collapse of the left foot.  There is no cellulitis there is no odor or drainage.  The ulcer is 2 cm in diameter and 3 mm deep.  There is no undermining.  No exposed bone or tendon.  Patient has a palpable dorsalis pedis pulse.  Review of the MRI scan does not show any osteomyelitis.  Hemoglobin A1c 9.1 with a white cell count of 12,000.  Assessment: Assessment: Uncontrolled type 2 diabetes with a Charcot collapse of the left foot with intact circulation to the ankle with a Wagner grade 1 ulcer beneath the Charcot rocker-bottom deformity.  Plan: Plan: I will place an order for a PRAFO boot to be worn on the left foot.  Routine dressing changes to the left heel.  I will follow-up in the office.  Thank you for the consult and the opportunity to see Mr. Taylor Battershell, MD [facility] [phone] 2:20 PM

## 2023-11-13 NOTE — Progress Notes (Signed)
 PROGRESS NOTE    Taylor Hughes  ZOX:[id number] DOB: 05-09-1971 DOA: 11/10/2023 PCP: Hoy Register, MD  52/M with history of type 2 diabetes mellitus, DKA, gastroparesis, right foot transmetatarsal amputation, lymphedema, morbid obesity presented to the ED with weakness, elevated blood sugars, some nausea vomiting and diarrhea, has not been taking insulin or checking blood sugars as he feels poorly, in the ED he was noted to have significant sinus tachycardia, CBGs 560s, bicarb of 9, diabetic ketoacidosis, creatinine 2.0, WBC 15.1 -CT abdomen with bilateral perinephric stranding   Subjective: No significant events overnight, he is complaining of generalized body ache which is at baseline.  Assessment and Plan:  Sepsis, POA Diabetic foot wound -Source of sepsis most likely related to left foot diabetic wound, MRI of left foot has been obtained, fortunately no evidence of abscess osteomyelitis - Blood cultures remain negative, continue with broad-spectrum antibiotics, will narrow over next 24 hours  - Orthopedic input greatly appreciated, continue with Profore boot, and routine dressing changes Dr. [name] to follow as an outpatient after discharge  -Discussed with Dr. [name], will assess wound today to give recommendation regarding wound care and weightbearing status - Go cytosis and procalcitonin trending down  Diabetes mellitus, type II, uncontrolled with hyperglycemia -A1c is 9.2 -Discussed with the patient, reports poor compliance few days prior to admission. -Transitioned off insulin drip to subcu insulin as anion gap has closed.  Paroxysmal atrial fibrillation -Not on anticoagulation at baseline, currently with sinus tachycardia, continue metoprolol, dose increased  ?  Pyelonephritis -Some perinephric stranding noted on CT yesterday, has chronic back pain, doubt related to this, urinalysis pending, UA is negative, h  Acute kidney injury -In the setting of DKA and dehydration,  improving  Chronic anemia -Follow-up anemia panel  Severe hypophosphatemia Hypokalemia -replaced  Chronic diastolic CHF -Echo 8/24 with EF 75%, indeterminate diastolic function  2 cm right adrenal nodule -further  workup and imaging as an outpatient   DVT prophylaxis:  Lovenox Code Status: Full code Family Communication: None present Disposition Plan:  in progressive  Consultants:  PCCM Orthopedic  Procedures:   Antimicrobials:    Objective: Vitals:   11/13/23 0400 11/13/23 0744 11/13/23 1145 11/13/23 1541  BP: (!) 152/86 (!) 167/100 (!) 176/100 (!) 150/86  Pulse: 88 99 96 97  Resp: 17 17 (!) 8 15  Temp: 97.6 F (36.4 C) 97.6 F (36.4 C) 98.4 F (36.9 C) 99.5 F (37.5 C)  TempSrc: Oral Oral  Oral  SpO2: 91% 95% 98% 94%  Weight:      Height:        Intake/Output Summary (Last 24 hours) at 11/13/2023 1639 Last data filed at 11/13/2023 1200 Gross per 24 hour  Intake 480 ml  Output 1650 ml  Net -1170 ml   Filed Weights   11/10/23 1517 11/11/23 0400 11/11/23 1528  Weight: (!) 142.4 kg 128.6 kg (!) 137.4 kg    Examination:  Awake Alert, Oriented X 3, No new F.N deficits, Normal affect Symmetrical Chest wall movement, Good air movement bilaterally, CTAB RRR,No Gallops,Rubs or new Murmurs, No Parasternal Heave +ve B.Sounds, Abd Soft, No tenderness, No rebound - guarding or rigidity.  Bilateral lower extremity lymphedema, right foot TMA, left foot with wound intraservice, please see picture below       Data Reviewe 1 d:   CBC: Recent Labs  Lab 11/10/23 1717 11/10/23 1922 11/11/23 0319 11/12/23 0449 11/13/23 1117  WBC  --  18.9* 15.1* 15.0* 12.0*  NEUTROABS  --  17.4*  --   --   --  HGB 12.9* 11.3* 10.7* 11.0* 11.0*  HCT 38.0* 35.6* 32.7* 34.2* 33.5*  MCV  --  86.4 83.4 84.4 83.3  PLT  --  314 302 304 324   Basic Metabolic Panel: Recent Labs  Lab 11/11/23 0319 11/11/23 0917 11/11/23 1757 11/12/23 0449 11/12/23 1652 11/13/23 1117   NA 138  --  135 135 134* 135  K 3.5  --  4.0 4.0 4.4 3.3*  CL 108  --  107 103 108 105  CO2 19*  --  16* 15* 19* 19*  GLUCOSE 137*  --  254* 299* 276* 249*  BUN 34*  --  34* 38* 34* 25*  CREATININE 1.51*  --  1.47* 1.31* 1.14 1.03  CALCIUM 8.6*  --  8.5* 8.5* 8.1* 8.3*  MG  --  1.8  --  2.0  --  2.0  PHOS  --  <1.0*  --  2.1*  --  2.2*   GFR: Estimated Creatinine Clearance: 120.1 mL/min (by C-G formula based on SCr of 1.03 mg/dL). Liver Function Tests: Recent Labs  Lab 11/10/23 1922 11/12/23 0449  AST 15 15  ALT 12 10  ALKPHOS 102 76  BILITOT 1.6* 0.8  PROT 7.0 6.5  ALBUMIN 2.0* 1.8*   No results for input(s): "LIPASE", "AMYLASE" in the last 168 hours. No results for input(s): "AMMONIA" in the last 168 hours. Coagulation Profile: No results for input(s): "INR", "PROTIME" in the last 168 hours. Cardiac Enzymes: Recent Labs  Lab 11/10/23 1922  CKTOTAL 88   BNP (last 3 results) No results for input(s): "PROBNP" in the last 8760 hours. HbA1C: Recent Labs    11/11/23 0319  HGBA1C 9.1*   CBG: Recent Labs  Lab 11/12/23 1630 11/12/23 2117 11/13/23 0740 11/13/23 1146 11/13/23 1625  GLUCAP 270* 258* 259* 206* 158*   Lipid Profile: Recent Labs    11/11/23 0319  CHOL 153  HDL 60  LDLCALC 81  TRIG 61  CHOLHDL 2.6   Thyroid Function Tests: No results for input(s): "TSH", "T4TOTAL", "FREET4", "T3FREE", "THYROIDAB" in the last 72 hours. Anemia Panel: No results for input(s): "VITAMINB12", "FOLATE", "FERRITIN", "TIBC", "IRON", "RETICCTPCT" in the last 72 hours. Urine analysis:    Component Value Date/Time   COLORURINE YELLOW 11/11/2023 1852   APPEARANCEUR HAZY (A) 11/11/2023 1852   APPEARANCEUR Clear 06/25/2023 1537   LABSPEC 1.029 11/11/2023 1852   PHURINE 6.0 11/11/2023 1852   GLUCOSEU 50 (A) 11/11/2023 1852   HGBUR SMALL (A) 11/11/2023 1852   BILIRUBINUR NEGATIVE 11/11/2023 1852   BILIRUBINUR Negative 06/25/2023 1537   KETONESUR 20 (A) 11/11/2023  1852   PROTEINUR >=300 (A) 11/11/2023 1852   UROBILINOGEN 0.2 11/14/2021 1146   NITRITE NEGATIVE 11/11/2023 1852   LEUKOCYTESUR NEGATIVE 11/11/2023 1852   Sepsis Labs: @LABRCNTIP (procalcitonin:4,lacticidven:4)  ) Recent Results (from the past 240 hours)  Resp panel by RT-PCR (RSV, Flu A&B, Covid) Urine, Clean Catch     Status: None   Collection Time: 11/10/23  3:55 PM   Specimen: Urine, Clean Catch; Nasal Swab  Result Value Ref Range Status   SARS Coronavirus 2 by RT PCR NEGATIVE NEGATIVE Final   Influenza A by PCR NEGATIVE NEGATIVE Final   Influenza B by PCR NEGATIVE NEGATIVE Final    Comment: (NOTE) The Xpert Xpress SARS-CoV-2/FLU/RSV plus assay is intended as an aid in the diagnosis of influenza from Nasopharyngeal swab specimens and should not be used as a sole basis for treatment. Nasal washings and aspirates are unacceptable for Xpert Xpress SARS-CoV-2/FLU/RSV  testing.  Fact Sheet for Patients: BloggerCourse.com  Fact Sheet for Healthcare Providers: SeriousBroker.it  This test is not yet approved or cleared by the [place] FDA and has been authorized for detection and/or diagnosis of SARS-CoV-2 by FDA under an Emergency Use Authorization (EUA). This EUA will remain in effect (meaning this test can be used) for the duration of the COVID-19 declaration under Section 564(b)(1) of the Act, 21 U.S.C. section 360bbb-3(b)(1), unless the authorization is terminated or revoked.     Resp Syncytial Virus by PCR NEGATIVE NEGATIVE Final    Comment: (NOTE) Fact Sheet for Patients: BloggerCourse.com  Fact Sheet for Healthcare Providers: SeriousBroker.it  This test is not yet approved or cleared by the [place] FDA and has been authorized for detection and/or diagnosis of SARS-CoV-2 by FDA under an Emergency Use Authorization (EUA). This EUA will remain in effect  (meaning this test can be used) for the duration of the COVID-19 declaration under Section 564(b)(1) of the Act, 21 U.S.C. section 360bbb-3(b)(1), unless the authorization is terminated or revoked.  Performed at [facility], [address], [place], [place] [address]   Urine Culture (for pregnant, neutropenic or urologic patients or patients with an indwelling urinary catheter)     Status: Abnormal   Collection Time: 11/11/23  2:34 AM   Specimen: Urine, Clean Catch  Result Value Ref Range Status   Specimen Description URINE, CLEAN CATCH  Final   Special Requests   Final    NONE Performed at [facility], [address] [place], [place] [address]    Culture MULTIPLE SPECIES PRESENT, SUGGEST RECOLLECTION (A)  Final   Report Status 11/12/2023 FINAL  Final  Culture, blood (Routine X 2) w Reflex to ID Panel     Status: None (Preliminary result)   Collection Time: 11/11/23  3:08 AM   Specimen: BLOOD LEFT HAND  Result Value Ref Range Status   Specimen Description BLOOD LEFT HAND  Final   Special Requests   Final    BOTTLES DRAWN AEROBIC AND ANAEROBIC Blood Culture adequate volume   Culture   Final    NO GROWTH 2 DAYS Performed at [facility], [address], [place], [place] [address]    Report Status PENDING  Incomplete  Culture, blood (Routine X 2) w Reflex to ID Panel     Status: None (Preliminary result)   Collection Time: 11/11/23  3:19 AM   Specimen: BLOOD LEFT HAND  Result Value Ref Range Status   Specimen Description BLOOD LEFT HAND  Final   Special Requests   Final    BOTTLES DRAWN AEROBIC AND ANAEROBIC Blood Culture adequate volume   Culture   Final    NO GROWTH 2 DAYS Performed at [facility], [address], [place], [place] [address]    Report Status PENDING  Incomplete  MRSA Next Gen by PCR, Nasal     Status: Abnormal   Collection Time: 11/11/23  4:36 AM   Specimen: Nasal Mucosa; Nasal Swab  Result Value Ref Range Status   MRSA by PCR  Next Gen DETECTED (A) NOT DETECTED Final    Comment: RESULT CALLED TO, READ BACK BY AND VERIFIED WITH: B WARNER,RN@0610  11/11/23 MK (NOTE) The GeneXpert MRSA Assay (FDA approved for NASAL specimens only), is one component of a comprehensive MRSA colonization surveillance program. It is not intended to diagnose MRSA infection nor to guide or monitor treatment for MRSA infections. Test performance is not FDA approved  in patients less than 13 years old. Performed at [facility], [address], [place], [place] [address]      Radiology Studies: No results found.    Scheduled Meds:  atorvastatin  20 mg Oral Daily   Chlorhexidine Gluconate Cloth  6 each Topical Daily   enoxaparin (LOVENOX) injection  70 mg Subcutaneous Q24H   gabapentin  300 mg Oral BID   insulin aspart  0-15 Units Subcutaneous TID WC   insulin aspart  0-5 Units Subcutaneous QHS   insulin aspart  3 Units Subcutaneous TID WC   insulin glargine-yfgn  25 Units Subcutaneous Daily   metoprolol tartrate  50 mg Oral BID   multivitamin with minerals  1 tablet Oral Daily   mupirocin ointment  1 Application Nasal BID   nutrition supplement (JUVEN)  1 packet Oral BID BM   phosphorus  500 mg Oral BID   thiamine  100 mg Oral Daily   Continuous Infusions:  cefTRIAXone (ROCEPHIN)  IV 2 g (11/13/23 0028)   vancomycin 1,750 mg (11/13/23 1216)     LOS: 2 days     Huey Bienenstock , MD [facility]   11/13/2023, 4:39 PM

## 2023-11-14 DIAGNOSIS — E1110 Type 2 diabetes mellitus with ketoacidosis without coma: Secondary | ICD-10-CM | POA: Diagnosis not present

## 2023-11-14 LAB — PHOSPHORUS: Phosphorus: 2.7 mg/dL (ref 2.5–4.6)

## 2023-11-14 LAB — CBC
HCT: 31.9 % — ABNORMAL LOW (ref 39.0–52.0)
Hemoglobin: 10.5 g/dL — ABNORMAL LOW (ref 13.0–17.0)
MCH: 27.2 pg (ref 26.0–34.0)
MCHC: 32.9 g/dL (ref 30.0–36.0)
MCV: 82.6 fL (ref 80.0–100.0)
Platelets: 333 10*3/uL (ref 150–400)
RBC: 3.86 MIL/uL — ABNORMAL LOW (ref 4.22–5.81)
RDW: 17.9 % — ABNORMAL HIGH (ref 11.5–15.5)
WBC: 12.1 10*3/uL — ABNORMAL HIGH (ref 4.0–10.5)
nRBC: 0 % (ref 0.0–0.2)

## 2023-11-14 LAB — GLUCOSE, CAPILLARY
Glucose-Capillary: 221 mg/dL — ABNORMAL HIGH (ref 70–99)
Glucose-Capillary: 200 mg/dL — ABNORMAL HIGH (ref 70–99)
Glucose-Capillary: 232 mg/dL — ABNORMAL HIGH (ref 70–99)
Glucose-Capillary: 219 mg/dL — ABNORMAL HIGH (ref 70–99)

## 2023-11-14 LAB — BASIC METABOLIC PANEL WITH GFR
Anion gap: 11 (ref 5–15)
BUN: 19 mg/dL (ref 6–20)
CO2: 18 mmol/L — ABNORMAL LOW (ref 22–32)
Calcium: 8.1 mg/dL — ABNORMAL LOW (ref 8.9–10.3)
Chloride: 108 mmol/L (ref 98–111)
Creatinine, Ser: 0.93 mg/dL (ref 0.61–1.24)
GFR, Estimated: >60 mL/min (ref >60)
Glucose, Bld: 205 mg/dL — ABNORMAL HIGH (ref 70–99)
Potassium: 3.7 mmol/L (ref 3.5–5.1)
Sodium: 137 mmol/L (ref 135–145)

## 2023-11-14 MED ORDER — GLUCERNA SHAKE PO LIQD
237.0000 mL | Freq: Three times a day (TID) | ORAL | Status: DC
  Administered 2023-11-14: 237 mL via ORAL

## 2023-11-14 MED ORDER — ACETAMINOPHEN 325 MG PO TABS
650.0000 mg | ORAL_TABLET | Freq: Four times a day (QID) | ORAL | Status: DC | PRN
  Administered 2023-11-14 – 2023-11-15 (×2): 650 mg via ORAL
  Filled 2023-11-14 (×2): qty 2

## 2023-11-14 NOTE — Evaluation (Signed)
 Physical Therapy Evaluation Patient Details Name: Taylor Hughes MRN: [id number] DOB: 1971/01/02 Today's Date: 11/14/2023  History of Present Illness  53 year old make admitted 4/1 with weakness, elevated BS and nausea and vomiting. He had not been taking his insulin or checking his BS. Also found a L foot diabetic ulcer. PMH includes R forefoot amputation , lymphedema and morbid obesity  Clinical Impression  Pt admitted with above diagnosis. Some improvement today based on ability to pariticipate with OT evaluation today. Patient was able to mobilize (VERY SLOWLY) out of bed and to [employer] [facility] with min assist and RW for support. Took several steps forward with CGA for safety and cues for technique. PRAFO donned. Very anxious throughout session but feels more confident now that he has mobilized. He states that he feels family would be able to provided needed care at d/c and is open to HHPT follow-up. He has a flat entrance to a side door in his home and states a wheelchair will fit throughout most of the house. Would favor seeing pt again prior to d/c to work a bit more or gait training as tolerated but based on report provided he should be able to safely mobilize with W/c at home, and transfer with family assist. Will continue to progress as tolerated.  Pt currently with functional limitations due to the deficits listed below (see PT Problem List). Pt will benefit from acute skilled PT to increase their independence and safety with mobility to allow discharge.           If plan is discharge home, recommend the following: A little help with walking and/or transfers;A little help with bathing/dressing/bathroom;Assist for transportation;Assistance with cooking/housework   Can travel by Doctor, hospital (measurements PT);Wheelchair cushion (measurements PT);BSC/3in1 (With elevating leg rests)  Recommendations for Other Services       Functional Status Assessment  Patient has had a recent decline in their functional status and demonstrates the ability to make significant improvements in function in a reasonable and predictable amount of time.     Precautions / Restrictions Precautions Precautions: Fall Recall of Precautions/Restrictions: Intact Required Braces or Orthoses: Other Brace Other Brace: PRAFO Restrictions Weight Bearing Restrictions Per Provider Order: No Other Position/Activity Restrictions: Wear PRAFO per Ortho      Mobility  Bed Mobility Overal bed mobility: Needs Assistance Bed Mobility: Supine to Sit     Supine to sit: Min assist, Used rails, HOB elevated     General bed mobility comments: Min assist for pt to pull through therapist's hand. Very slow and anxious but reports improved from yesterday.    Transfers Overall transfer level: Needs assistance Equipment used: Rolling walker (2 wheels) Transfers: Sit to/from Stand Sit to Stand: Min assist, From elevated surface           General transfer comment: Min assist for boost to stand from elevated bed surface and BSC. Min assist for RW control with step pivot tranfer to recliner. VC for sequencing and to maximize UE support as needed through RW for comfort.    Ambulation/Gait Ambulation/Gait assistance: Contact guard assist Gait Distance (Feet): 3 Feet Assistive device: Rolling walker (2 wheels) Gait Pattern/deviations: Step-to pattern, Decreased stance time - right, Antalgic Gait velocity: dec Gait velocity interpretation: <1.31 ft/sec, indicative of household ambulator   General Gait Details: Tolerated taking several steps forward and turning towards recliner after exiting BSC. No buckling, reliant on UEs for support. CUes for technique and sequencing.  CGA for safety.  Stairs            Wheelchair Mobility     Tilt Bed    Modified Rankin (Stroke Patients Only)       Balance Overall balance assessment: Needs assistance Sitting-balance support:  No upper extremity supported, Feet supported Sitting balance-Leahy Scale: Fair     Standing balance support: During functional activity, Single extremity supported, Reliant on assistive device for balance Standing balance-Leahy Scale: Poor                               Pertinent Vitals/Pain Pain Assessment Pain Assessment: Faces Faces Pain Scale: Hurts even more Pain Location: Lt foot, back Pain Descriptors / Indicators: Grimacing, Moaning, Discomfort Pain Intervention(s): Monitored during session, Limited activity within patient's tolerance, Repositioned    Home Living Family/patient expects to be discharged to:: Private residence Living Arrangements: Spouse/significant other;Other relatives Available Help at Discharge: Family Type of Home: House Home Access: Stairs to enter Entrance Stairs-Rails: Can reach both Entrance Stairs-Number of Steps: Can access on the side with no steps (2 in front with rails)   Home Layout: One level Home Equipment: Agricultural consultant (2 wheels);Cane - single point;Grab bars - tub/shower      Prior Function Prior Level of Function : Independent/Modified Independent;Driving             Mobility Comments: independent ADLs Comments: Independent on disability     Extremity/Trunk Assessment   Upper Extremity Assessment Upper Extremity Assessment: Defer to OT evaluation    Lower Extremity Assessment Lower Extremity Assessment: Generalized weakness (Edematous bil, Lt heel bandaged)    Cervical / Trunk Assessment Cervical / Trunk Assessment: Other exceptions;Neck Surgery  Communication   Communication Communication: No apparent difficulties    Cognition Arousal: Alert Behavior During Therapy: Anxious   PT - Cognitive impairments: No apparent impairments                         Following commands: Intact       Cueing Cueing Techniques: Verbal cues     General Comments General comments (skin integrity, edema,  etc.): Required assist with peri-care after having BM on [facility]    Exercises General Exercises - Lower Extremity Ankle Circles/Pumps: AROM, Both, 10 reps, Supine Quad Sets: Strengthening, 10 reps, Left, Supine Gluteal Sets: Strengthening, Both, 10 reps, Supine Heel Slides: AAROM, Left, 5 reps, Supine Hip ABduction/ADduction: AROM, Left, 5 reps, Supine   Assessment/Plan    PT Assessment Patient needs continued PT services  PT Problem List Decreased strength;Decreased range of motion;Decreased activity tolerance;Decreased balance;Decreased mobility;Decreased knowledge of use of DME;Decreased knowledge of precautions;Obesity;Pain       PT Treatment Interventions DME instruction;Gait training;Functional mobility training;Therapeutic activities;Therapeutic exercise;Neuromuscular re-education;Balance training;Patient/family education;Wheelchair mobility training    PT Goals (Current goals can be found in the Care Plan section)  Acute Rehab PT Goals Patient Stated Goal: Get well, return home with family support. PT Goal Formulation: With patient Time For Goal Achievement: 11/28/23 Potential to Achieve Goals: Good    Frequency Min 2X/week     Co-evaluation               AM-PAC PT "6 Clicks" Mobility  Outcome Measure Help needed turning from your back to your side while in a flat bed without using bedrails?: A Little Help needed moving from lying on your back to sitting on the side of a flat  bed without using bedrails?: A Little Help needed moving to and from a bed to a chair (including a wheelchair)?: A Little Help needed standing up from a chair using your arms (e.g., wheelchair or bedside chair)?: A Little Help needed to walk in hospital room?: A Little Help needed climbing 3-5 steps with a railing? : A Lot 6 Click Score: 17    End of Session Equipment Utilized During Treatment: Gait belt Activity Tolerance: Other (comment) (Limited by anxiety) Patient left: in chair;with  call bell/phone within reach;with chair alarm set;with SCD's reapplied (Refused LE elevation - increases back pain) Nurse Communication: Mobility status PT Visit Diagnosis: Unsteadiness on feet (R26.81);Other abnormalities of gait and mobility (R26.89);Muscle weakness (generalized) (M62.81);Pain Pain - Right/Left: Left Pain - part of body:  (heel and back)    Time: 1610-9604 PT Time Calculation (min) (ACUTE ONLY): 51 min   Charges:   PT Evaluation $PT Eval Low Complexity: 1 Low PT Treatments $Therapeutic Exercise: 8-22 mins $Therapeutic Activity: 8-22 mins PT General Charges $$ ACUTE PT VISIT: 1 Visit         Kathlyn Sacramento, PT, DPT [facility]  Rehabilitation Services Physical Therapist Office: [phone] Website: [email or web address]   Berton Mount 11/14/2023, 11:08 AM

## 2023-11-14 NOTE — Plan of Care (Signed)

## 2023-11-14 NOTE — Progress Notes (Signed)
 PROGRESS NOTE    Taylor Hughes  OZD:[id number] DOB: 1971/05/04 DOA: 11/10/2023 PCP: Hoy Register, MD  52/M with history of type 2 diabetes mellitus, DKA, gastroparesis, right foot transmetatarsal amputation, lymphedema, morbid obesity presented to the ED with weakness, elevated blood sugars, some nausea vomiting and diarrhea, has not been taking insulin or checking blood sugars as he feels poorly, in the ED he was noted to have significant sinus tachycardia, CBGs 560s, bicarb of 9, diabetic ketoacidosis, creatinine 2.0, WBC 15.1 -CT abdomen with bilateral perinephric stranding   Subjective: No significant events overnight, he was able to tolerate 1 hour sitting in the chair today  Assessment and Plan:  Sepsis, POA Diabetic foot wound -Source of sepsis most likely related to left foot diabetic wound, MRI of left foot has been obtained, fortunately no evidence of abscess osteomyelitis - Blood cultures remain negative, will discontinue vancomycin and continue with Rocephin - Orthopedic input greatly appreciated, continue with Profore boot, and routine dressing changes Dr. [name] to follow as an outpatient after discharge  - Dr. [name] input greatly appreciated, continue with wound care and [facility] boot left foot - Leukocytosis and procalcitonin trending down  Diabetes mellitus, type II, uncontrolled with hyperglycemia -A1c is 9.2 -Discussed with the patient, reports poor compliance few days prior to admission. -Transitioned off insulin drip to subcu insulin as anion gap has closed.  Paroxysmal atrial fibrillation -Not on anticoagulation at baseline, currently with sinus tachycardia, continue metoprolol, dose increased  ?  Pyelonephritis -Some perinephric stranding noted on CT yesterday, has chronic back pain, doubt related to this, urinalysis pending, UA is negative, h  Acute kidney injury -In the setting of DKA and dehydration, improving  Chronic anemia -Follow-up anemia panel  Severe  hypophosphatemia Hypokalemia -replaced  Chronic diastolic CHF -Echo 8/24 with EF 75%, indeterminate diastolic function  2 cm right adrenal nodule -further  workup and imaging as an outpatient   DVT prophylaxis:  Lovenox Code Status: Full code Family Communication: None present Disposition Plan: Pending progression with PT,  Consultants:  PCCM Orthopedic  Procedures:   Antimicrobials:    Objective: Vitals:   11/14/23 0000 11/14/23 0400 11/14/23 0800 11/14/23 1300  BP: 135/77 (!) 165/93 (!) 162/78 (!) 158/92  Pulse: 82 95 95 91  Resp:   19 20  Temp:  98.6 F (37 C) 98 F (36.7 C) 98.2 F (36.8 C)  TempSrc:  Oral Oral Oral  SpO2: 96% 97% 94% 92%  Weight:      Height:        Intake/Output Summary (Last 24 hours) at 11/14/2023 1359 Last data filed at 11/14/2023 0600 Gross per 24 hour  Intake 720 ml  Output 800 ml  Net -80 ml   Filed Weights   11/10/23 1517 11/11/23 0400 11/11/23 1528  Weight: (!) 142.4 kg 128.6 kg (!) 137.4 kg    Examination:  Awake Alert, Oriented X 3, No new F.N deficits, Normal affect Symmetrical Chest wall movement, Good air movement bilaterally, CTAB RRR,No Gallops,Rubs or new Murmurs, No Parasternal Heave +ve B.Sounds, Abd Soft, No tenderness, No rebound - guarding or rigidity. No Cyanosis, Clubbing or edema, right TMA, left foot on PRAFO boot    Data Reviewe 1 d:   CBC: Recent Labs  Lab 11/10/23 1922 11/11/23 0319 11/12/23 0449 11/13/23 1117 11/14/23 0548  WBC 18.9* 15.1* 15.0* 12.0* 12.1*  NEUTROABS 17.4*  --   --   --   --   HGB 11.3* 10.7* 11.0* 11.0* 10.5*  HCT 35.6* 32.7*  34.2* 33.5* 31.9*  MCV 86.4 83.4 84.4 83.3 82.6  PLT 314 302 304 324 333   Basic Metabolic Panel: Recent Labs  Lab 11/11/23 0917 11/11/23 1757 11/12/23 0449 11/12/23 1652 11/13/23 1117 11/14/23 0548  NA  --  135 135 134* 135 137  K  --  4.0 4.0 4.4 3.3* 3.7  CL  --  107 103 108 105 108  CO2  --  16* 15* 19* 19* 18*  GLUCOSE  --  254*  299* 276* 249* 205*  BUN  --  34* 38* 34* 25* 19  CREATININE  --  1.47* 1.31* 1.14 1.03 0.93  CALCIUM  --  8.5* 8.5* 8.1* 8.3* 8.1*  MG 1.8  --  2.0  --  2.0  --   PHOS <1.0*  --  2.1*  --  2.2* 2.7   GFR: Estimated Creatinine Clearance: 133 mL/min (by C-G formula based on SCr of 0.93 mg/dL). Liver Function Tests: Recent Labs  Lab 11/10/23 1922 11/12/23 0449  AST 15 15  ALT 12 10  ALKPHOS 102 76  BILITOT 1.6* 0.8  PROT 7.0 6.5  ALBUMIN 2.0* 1.8*   No results for input(s): "LIPASE", "AMYLASE" in the last 168 hours. No results for input(s): "AMMONIA" in the last 168 hours. Coagulation Profile: No results for input(s): "INR", "PROTIME" in the last 168 hours. Cardiac Enzymes: Recent Labs  Lab 11/10/23 1922  CKTOTAL 88   BNP (last 3 results) No results for input(s): "PROBNP" in the last 8760 hours. HbA1C: No results for input(s): "HGBA1C" in the last 72 hours.  CBG: Recent Labs  Lab 11/13/23 1146 11/13/23 1625 11/13/23 2123 11/14/23 0830 11/14/23 1211  GLUCAP 206* 158* 173* 221* 200*   Lipid Profile: No results for input(s): "CHOL", "HDL", "LDLCALC", "TRIG", "CHOLHDL", "LDLDIRECT" in the last 72 hours.  Thyroid Function Tests: No results for input(s): "TSH", "T4TOTAL", "FREET4", "T3FREE", "THYROIDAB" in the last 72 hours. Anemia Panel: No results for input(s): "VITAMINB12", "FOLATE", "FERRITIN", "TIBC", "IRON", "RETICCTPCT" in the last 72 hours. Urine analysis:    Component Value Date/Time   COLORURINE YELLOW 11/11/2023 1852   APPEARANCEUR HAZY (A) 11/11/2023 1852   APPEARANCEUR Clear 06/25/2023 1537   LABSPEC 1.029 11/11/2023 1852   PHURINE 6.0 11/11/2023 1852   GLUCOSEU 50 (A) 11/11/2023 1852   HGBUR SMALL (A) 11/11/2023 1852   BILIRUBINUR NEGATIVE 11/11/2023 1852   BILIRUBINUR Negative 06/25/2023 1537   KETONESUR 20 (A) 11/11/2023 1852   PROTEINUR >=300 (A) 11/11/2023 1852   UROBILINOGEN 0.2 11/14/2021 1146   NITRITE NEGATIVE 11/11/2023 1852    LEUKOCYTESUR NEGATIVE 11/11/2023 1852   Sepsis Labs: @LABRCNTIP (procalcitonin:4,lacticidven:4)  ) Recent Results (from the past 240 hours)  Resp panel by RT-PCR (RSV, Flu A&B, Covid) Urine, Clean Catch     Status: None   Collection Time: 11/10/23  3:55 PM   Specimen: Urine, Clean Catch; Nasal Swab  Result Value Ref Range Status   SARS Coronavirus 2 by RT PCR NEGATIVE NEGATIVE Final   Influenza A by PCR NEGATIVE NEGATIVE Final   Influenza B by PCR NEGATIVE NEGATIVE Final    Comment: (NOTE) The Xpert Xpress SARS-CoV-2/FLU/RSV plus assay is intended as an aid in the diagnosis of influenza from Nasopharyngeal swab specimens and should not be used as a sole basis for treatment. Nasal washings and aspirates are unacceptable for Xpert Xpress SARS-CoV-2/FLU/RSV testing.  Fact Sheet for Patients: BloggerCourse.com  Fact Sheet for Healthcare Providers: SeriousBroker.it  This test is not yet approved or cleared by the  [place] [employer] trader and has been authorized for detection and/or diagnosis of SARS-CoV-2 by FDA under an TEFL teacher (EUA). This EUA will remain in effect (meaning this test can be used) for the duration of the COVID-19 declaration under Section 564(b)(1) of the Act, 21 U.S.C. section 360bbb-3(b)(1), unless the authorization is terminated or revoked.     Resp Syncytial Virus by PCR NEGATIVE NEGATIVE Final    Comment: (NOTE) Fact Sheet for Patients: BloggerCourse.com  Fact Sheet for Healthcare Providers: SeriousBroker.it  This test is not yet approved or cleared by the [place] FDA and has been authorized for detection and/or diagnosis of SARS-CoV-2 by FDA under an Emergency Use Authorization (EUA). This EUA will remain in effect (meaning this test can be used) for the duration of the COVID-19 declaration under Section 564(b)(1) of the Act, 21  U.S.C. section 360bbb-3(b)(1), unless the authorization is terminated or revoked.  Performed at [facility], [address], [place], [place] [address]   Urine Culture (for pregnant, neutropenic or urologic patients or patients with an indwelling urinary catheter)     Status: Abnormal   Collection Time: 11/11/23  2:34 AM   Specimen: Urine, Clean Catch  Result Value Ref Range Status   Specimen Description URINE, CLEAN CATCH  Final   Special Requests   Final    NONE Performed at [facility], [address], [place], [place] [address]    Culture MULTIPLE SPECIES PRESENT, SUGGEST RECOLLECTION (A)  Final   Report Status 11/12/2023 FINAL  Final  Culture, blood (Routine X 2) w Reflex to ID Panel     Status: None (Preliminary result)   Collection Time: 11/11/23  3:08 AM   Specimen: BLOOD LEFT HAND  Result Value Ref Range Status   Specimen Description BLOOD LEFT HAND  Final   Special Requests   Final    BOTTLES DRAWN AEROBIC AND ANAEROBIC Blood Culture adequate volume   Culture   Final    NO GROWTH 3 DAYS Performed at [facility], [address] [place], [place] [address]    Report Status PENDING  Incomplete  Culture, blood (Routine X 2) w Reflex to ID Panel     Status: None (Preliminary result)   Collection Time: 11/11/23  3:19 AM   Specimen: BLOOD LEFT HAND  Result Value Ref Range Status   Specimen Description BLOOD LEFT HAND  Final   Special Requests   Final    BOTTLES DRAWN AEROBIC AND ANAEROBIC Blood Culture adequate volume   Culture   Final    NO GROWTH 3 DAYS Performed at [facility], [address] [place], [place] [address]    Report Status PENDING  Incomplete  MRSA Next Gen by PCR, Nasal     Status: Abnormal   Collection Time: 11/11/23  4:36 AM   Specimen: Nasal Mucosa; Nasal Swab  Result Value Ref Range Status   MRSA by PCR Next Gen DETECTED (A) NOT DETECTED Final    Comment: RESULT CALLED TO, READ BACK BY AND VERIFIED WITH: B  WARNER,RN@0610  11/11/23 MK (NOTE) The GeneXpert MRSA Assay (FDA approved for NASAL specimens only), is one component of a comprehensive MRSA colonization surveillance program. It is not intended to diagnose MRSA infection nor to guide or monitor treatment for MRSA infections. Test performance is not FDA approved in patients less than 61 years old. Performed at [facility], [address], [place], [place] [address]  Radiology Studies: No results found.    Scheduled Meds:  atorvastatin  20 mg Oral Daily   Chlorhexidine Gluconate Cloth  6 each Topical Daily   enoxaparin (LOVENOX) injection  70 mg Subcutaneous Q24H   feeding supplement (GLUCERNA SHAKE)  237 mL Oral TID WC   gabapentin  300 mg Oral BID   insulin aspart  0-15 Units Subcutaneous TID WC   insulin aspart  0-5 Units Subcutaneous QHS   insulin aspart  3 Units Subcutaneous TID WC   insulin glargine-yfgn  25 Units Subcutaneous Daily   metoprolol tartrate  50 mg Oral BID   multivitamin with minerals  1 tablet Oral Daily   mupirocin ointment  1 Application Nasal BID   nutrition supplement (JUVEN)  1 packet Oral BID BM   phosphorus  500 mg Oral BID   thiamine  100 mg Oral Daily   Continuous Infusions:  cefTRIAXone (ROCEPHIN)  IV 2 g (11/14/23 0003)   vancomycin 1,750 mg (11/14/23 1236)     LOS: 3 days     Huey Bienenstock , MD [facility]   11/14/2023, 1:59 PM

## 2023-11-14 NOTE — TOC Progression Note (Signed)
 Transition of Care [facility]) - Progression Note    Patient Details  Name: Taylor Hughes MRN: [id number] Date of Birth: Jan 07, 1971  [facility] CM/SW Contact  Lawerance Sabal, RN Phone Number: 11/14/2023, 3:48 PM  Clinical Narrative:     WC to be delivered to room by [facility].  HH- [facility]- declined [facility]- declined [facility]- declined [facility]- declined  Expected Discharge Plan: Home/Self Care Barriers to Discharge: Continued Medical Work up  Expected Discharge Plan and Services   Discharge Planning Services: CM Consult   Living arrangements for the past 2 months: Apartment                 DME Arranged: Community education officer wheelchair with seat cushion DME Agency: [employer] Date DME Agency Contacted: 11/14/23 Time DME Agency Contacted: [phone] Representative spoke with at DME Agency: Vaughan Basta             Social Determinants of Health (SDOH) Interventions SDOH Screenings   Food Insecurity: Food Insecurity Present (11/12/2023)  Housing: Low Risk  (11/12/2023)  Transportation Needs: No Transportation Needs (11/12/2023)  Utilities: Not At Risk (11/12/2023)  Depression (PHQ2-9): Medium Risk (06/25/2023)  Financial Resource Strain: Medium Risk (06/24/2023)  Physical Activity: Unknown (06/24/2023)  Social Connections: Socially Isolated (06/24/2023)  Stress: No Stress Concern Present (06/24/2023)  Tobacco Use: Low Risk  (11/10/2023)    Readmission Risk Interventions    03/24/2023    4:56 PM 08/15/2022    1:23 PM  Readmission Risk Prevention Plan  Transportation Screening Complete Complete  PCP or Specialist Appt within 5-7 Days  Complete  PCP or Specialist Appt within 3-5 Days Complete   Home Care Screening  Complete  Medication Review (RN CM)  Complete  HRI or Home Care Consult Complete   Social Work Consult for Recovery Care Planning/Counseling Complete   Palliative Care Screening Not Applicable   Medication Review Oceanographer) Referral to Pharmacy

## 2023-11-15 DIAGNOSIS — E1110 Type 2 diabetes mellitus with ketoacidosis without coma: Secondary | ICD-10-CM | POA: Diagnosis not present

## 2023-11-15 LAB — BASIC METABOLIC PANEL WITH GFR
Anion gap: 12 (ref 5–15)
BUN: 16 mg/dL (ref 6–20)
CO2: 19 mmol/L — ABNORMAL LOW (ref 22–32)
Calcium: 8.1 mg/dL — ABNORMAL LOW (ref 8.9–10.3)
Chloride: 106 mmol/L (ref 98–111)
Creatinine, Ser: 1.14 mg/dL (ref 0.61–1.24)
GFR, Estimated: >60 mL/min (ref >60)
Glucose, Bld: 165 mg/dL — ABNORMAL HIGH (ref 70–99)
Potassium: 3.4 mmol/L — ABNORMAL LOW (ref 3.5–5.1)
Sodium: 137 mmol/L (ref 135–145)

## 2023-11-15 LAB — CBC
HCT: 34.2 % — ABNORMAL LOW (ref 39.0–52.0)
Hemoglobin: 11 g/dL — ABNORMAL LOW (ref 13.0–17.0)
MCH: 27.2 pg (ref 26.0–34.0)
MCHC: 32.2 g/dL (ref 30.0–36.0)
MCV: 84.7 fL (ref 80.0–100.0)
Platelets: 331 10*3/uL (ref 150–400)
RBC: 4.04 MIL/uL — ABNORMAL LOW (ref 4.22–5.81)
RDW: 17.8 % — ABNORMAL HIGH (ref 11.5–15.5)
WBC: 12.1 10*3/uL — ABNORMAL HIGH (ref 4.0–10.5)
nRBC: 0 % (ref 0.0–0.2)

## 2023-11-15 LAB — GLUCOSE, CAPILLARY
Glucose-Capillary: 183 mg/dL — ABNORMAL HIGH (ref 70–99)
Glucose-Capillary: 181 mg/dL — ABNORMAL HIGH (ref 70–99)
Glucose-Capillary: 144 mg/dL — ABNORMAL HIGH (ref 70–99)
Glucose-Capillary: 123 mg/dL — ABNORMAL HIGH (ref 70–99)

## 2023-11-15 LAB — PHOSPHORUS: Phosphorus: 2.9 mg/dL (ref 2.5–4.6)

## 2023-11-15 MED ORDER — POTASSIUM CHLORIDE CRYS ER 20 MEQ PO TBCR
40.0000 meq | EXTENDED_RELEASE_TABLET | Freq: Four times a day (QID) | ORAL | Status: AC
  Administered 2023-11-15 (×2): 40 meq via ORAL
  Filled 2023-11-15 (×2): qty 2

## 2023-11-15 NOTE — Progress Notes (Signed)
 PROGRESS NOTE    Taylor Hughes  XLK:[id number] DOB: 1971-07-26 DOA: 11/10/2023 PCP: Hoy Register, MD  52/M with history of type 2 diabetes mellitus, DKA, gastroparesis, right foot transmetatarsal amputation, lymphedema, morbid obesity presented to the ED with weakness, elevated blood sugars, some nausea vomiting and diarrhea, has not been taking insulin or checking blood sugars as he feels poorly, in the ED he was noted to have significant sinus tachycardia, CBGs 560s, bicarb of 9, diabetic ketoacidosis, creatinine 2.0, WBC 15.1 -CT abdomen with bilateral perinephric stranding   Subjective: No significant events overnight, was able to tolerate sitting in the chair working with PT yesterday  Assessment and Plan:  Sepsis, POA Diabetic foot wound -Source of sepsis most likely related to left foot diabetic wound, MRI of left foot has been obtained, fortunately no evidence of abscess osteomyelitis - Blood cultures remain negative, will discontinue vancomycin and continue with Rocephin - Orthopedic input greatly appreciated, continue with Profore boot, and routine dressing changes Dr. [name] to follow as an outpatient after discharge  - Dr. [name] input greatly appreciated, continue with wound care and [facility] boot left foot - Leukocytosis and procalcitonin trending down  Diabetes mellitus, type II, uncontrolled with hyperglycemia -A1c is 9.2 -Discussed with the patient, reports poor compliance few days prior to admission. -Transitioned off insulin drip to subcu insulin as anion gap has closed.  All CBG is much improved after increasing his Levemir, but given overall history with poor appetite I will hold on increasing any further today.  Paroxysmal atrial fibrillation -Not on anticoagulation at baseline, currently with sinus tachycardia, continue metoprolol, dose increased  ?  Pyelonephritis -Some perinephric stranding noted on CT yesterday, has chronic back pain, doubt related to this,  urinalysis pending, UA is negative, h  Acute kidney injury -In the setting of DKA and dehydration, improving  Chronic anemia -Follow-up anemia panel  Severe hypophosphatemia Hypokalemia -replaced  Chronic diastolic CHF -Echo 8/24 with EF 75%, indeterminate diastolic function  2 cm right adrenal nodule -further  workup and imaging as an outpatient   DVT prophylaxis:  Lovenox Code Status: Full code Family Communication: None present Disposition Plan: Likely home with home health tomorrow or day after  Consultants:  PCCM Orthopedic  Procedures:   Antimicrobials:    Objective: Vitals:   11/14/23 2348 11/15/23 0418 11/15/23 0800 11/15/23 0906  BP:  (!) 146/91 (!) 153/93 (!) 154/88  Pulse:  91 94 94  Resp:   16   Temp: 98.4 F (36.9 C)  98.2 F (36.8 C)   TempSrc: Axillary  Axillary   SpO2:  97% 96%   Weight:      Height:       No intake or output data in the 24 hours ending 11/15/23 1402  Filed Weights   11/10/23 1517 11/11/23 0400 11/11/23 1528  Weight: (!) 142.4 kg 128.6 kg (!) 137.4 kg    Examination:  Awake Alert, Oriented X 3, No new F.N deficits, Normal affect Symmetrical Chest wall movement, Good air movement bilaterally, CTAB RRR,No Gallops,Rubs or new Murmurs, No Parasternal Heave +ve B.Sounds, Abd Soft, No tenderness, No rebound - guarding or rigidity. No Cyanosis, Clubbing or edema, right TMA, left foot on PRAFO boot    Data Reviewe 1 d:   CBC: Recent Labs  Lab 11/10/23 1922 11/11/23 0319 11/12/23 0449 11/13/23 1117 11/14/23 0548 11/15/23 0638  WBC 18.9* 15.1* 15.0* 12.0* 12.1* 12.1*  NEUTROABS 17.4*  --   --   --   --   --  HGB 11.3* 10.7* 11.0* 11.0* 10.5* 11.0*  HCT 35.6* 32.7* 34.2* 33.5* 31.9* 34.2*  MCV 86.4 83.4 84.4 83.3 82.6 84.7  PLT 314 302 304 324 333 331   Basic Metabolic Panel: Recent Labs  Lab 11/11/23 0917 11/11/23 1757 11/12/23 0449 11/12/23 1652 11/13/23 1117 11/14/23 0548 11/15/23 0638  NA  --    < >  135 134* 135 137 137  K  --    < > 4.0 4.4 3.3* 3.7 3.4*  CL  --    < > 103 108 105 108 106  CO2  --    < > 15* 19* 19* 18* 19*  GLUCOSE  --    < > 299* 276* 249* 205* 165*  BUN  --    < > 38* 34* 25* 19 16  CREATININE  --    < > 1.31* 1.14 1.03 0.93 1.14  CALCIUM  --    < > 8.5* 8.1* 8.3* 8.1* 8.1*  MG 1.8  --  2.0  --  2.0  --   --   PHOS <1.0*  --  2.1*  --  2.2* 2.7 2.9   < > = values in this interval not displayed.   GFR: Estimated Creatinine Clearance: 108.5 mL/min (by C-G formula based on SCr of 1.14 mg/dL). Liver Function Tests: Recent Labs  Lab 11/10/23 1922 11/12/23 0449  AST 15 15  ALT 12 10  ALKPHOS 102 76  BILITOT 1.6* 0.8  PROT 7.0 6.5  ALBUMIN 2.0* 1.8*   No results for input(s): "LIPASE", "AMYLASE" in the last 168 hours. No results for input(s): "AMMONIA" in the last 168 hours. Coagulation Profile: No results for input(s): "INR", "PROTIME" in the last 168 hours. Cardiac Enzymes: Recent Labs  Lab 11/10/23 1922  CKTOTAL 88   BNP (last 3 results) No results for input(s): "PROBNP" in the last 8760 hours. HbA1C: No results for input(s): "HGBA1C" in the last 72 hours.  CBG: Recent Labs  Lab 11/14/23 1211 11/14/23 1606 11/14/23 2109 11/15/23 0806 11/15/23 1242  GLUCAP 200* 232* 219* 183* 181*   Lipid Profile: No results for input(s): "CHOL", "HDL", "LDLCALC", "TRIG", "CHOLHDL", "LDLDIRECT" in the last 72 hours.  Thyroid Function Tests: No results for input(s): "TSH", "T4TOTAL", "FREET4", "T3FREE", "THYROIDAB" in the last 72 hours. Anemia Panel: No results for input(s): "VITAMINB12", "FOLATE", "FERRITIN", "TIBC", "IRON", "RETICCTPCT" in the last 72 hours. Urine analysis:    Component Value Date/Time   COLORURINE YELLOW 11/11/2023 1852   APPEARANCEUR HAZY (A) 11/11/2023 1852   APPEARANCEUR Clear 06/25/2023 1537   LABSPEC 1.029 11/11/2023 1852   PHURINE 6.0 11/11/2023 1852   GLUCOSEU 50 (A) 11/11/2023 1852   HGBUR SMALL (A) 11/11/2023 1852    BILIRUBINUR NEGATIVE 11/11/2023 1852   BILIRUBINUR Negative 06/25/2023 1537   KETONESUR 20 (A) 11/11/2023 1852   PROTEINUR >=300 (A) 11/11/2023 1852   UROBILINOGEN 0.2 11/14/2021 1146   NITRITE NEGATIVE 11/11/2023 1852   LEUKOCYTESUR NEGATIVE 11/11/2023 1852   Sepsis Labs: @LABRCNTIP (procalcitonin:4,lacticidven:4)  ) Recent Results (from the past 240 hours)  Resp panel by RT-PCR (RSV, Flu A&B, Covid) Urine, Clean Catch     Status: None   Collection Time: 11/10/23  3:55 PM   Specimen: Urine, Clean Catch; Nasal Swab  Result Value Ref Range Status   SARS Coronavirus 2 by RT PCR NEGATIVE NEGATIVE Final   Influenza A by PCR NEGATIVE NEGATIVE Final   Influenza B by PCR NEGATIVE NEGATIVE Final    Comment: (NOTE) The Xpert Xpress  SARS-CoV-2/FLU/RSV plus assay is intended as an aid in the diagnosis of influenza from Nasopharyngeal swab specimens and should not be used as a sole basis for treatment. Nasal washings and aspirates are unacceptable for Xpert Xpress SARS-CoV-2/FLU/RSV testing.  Fact Sheet for Patients: BloggerCourse.com  Fact Sheet for Healthcare Providers: SeriousBroker.it  This test is not yet approved or cleared by the [place] FDA and has been authorized for detection and/or diagnosis of SARS-CoV-2 by FDA under an Emergency Use Authorization (EUA). This EUA will remain in effect (meaning this test can be used) for the duration of the COVID-19 declaration under Section 564(b)(1) of the Act, 21 U.S.C. section 360bbb-3(b)(1), unless the authorization is terminated or revoked.     Resp Syncytial Virus by PCR NEGATIVE NEGATIVE Final    Comment: (NOTE) Fact Sheet for Patients: BloggerCourse.com  Fact Sheet for Healthcare Providers: SeriousBroker.it  This test is not yet approved or cleared by the [place] FDA and has been authorized for detection and/or  diagnosis of SARS-CoV-2 by FDA under an Emergency Use Authorization (EUA). This EUA will remain in effect (meaning this test can be used) for the duration of the COVID-19 declaration under Section 564(b)(1) of the Act, 21 U.S.C. section 360bbb-3(b)(1), unless the authorization is terminated or revoked.  Performed at [facility], [address], [place], [place] [address]   Urine Culture (for pregnant, neutropenic or urologic patients or patients with an indwelling urinary catheter)     Status: Abnormal   Collection Time: 11/11/23  2:34 AM   Specimen: Urine, Clean Catch  Result Value Ref Range Status   Specimen Description URINE, CLEAN CATCH  Final   Special Requests   Final    NONE Performed at [facility], [address] [place], [place] [address]    Culture MULTIPLE SPECIES PRESENT, SUGGEST RECOLLECTION (A)  Final   Report Status 11/12/2023 FINAL  Final  Culture, blood (Routine X 2) w Reflex to ID Panel     Status: None (Preliminary result)   Collection Time: 11/11/23  3:08 AM   Specimen: BLOOD LEFT HAND  Result Value Ref Range Status   Specimen Description BLOOD LEFT HAND  Final   Special Requests   Final    BOTTLES DRAWN AEROBIC AND ANAEROBIC Blood Culture adequate volume   Culture   Final    NO GROWTH 4 DAYS Performed at [facility], [address], [place], [place] [address]    Report Status PENDING  Incomplete  Culture, blood (Routine X 2) w Reflex to ID Panel     Status: None (Preliminary result)   Collection Time: 11/11/23  3:19 AM   Specimen: BLOOD LEFT HAND  Result Value Ref Range Status   Specimen Description BLOOD LEFT HAND  Final   Special Requests   Final    BOTTLES DRAWN AEROBIC AND ANAEROBIC Blood Culture adequate volume   Culture   Final    NO GROWTH 4 DAYS Performed at [facility], [address], [place], [place] [address]    Report Status PENDING  Incomplete  MRSA Next Gen by PCR, Nasal     Status: Abnormal   Collection  Time: 11/11/23  4:36 AM   Specimen: Nasal Mucosa; Nasal Swab  Result Value Ref Range Status   MRSA by PCR Next Gen DETECTED (A) NOT DETECTED Final    Comment: RESULT CALLED TO, READ BACK BY AND VERIFIED WITH: B WARNER,RN@0610  11/11/23 MK (NOTE) The GeneXpert MRSA Assay (FDA  approved for NASAL specimens only), is one component of a comprehensive MRSA colonization surveillance program. It is not intended to diagnose MRSA infection nor to guide or monitor treatment for MRSA infections. Test performance is not FDA approved in patients less than 64 years old. Performed at [facility], [address] [place], [place] [address]      Radiology Studies: No results found.    Scheduled Meds:  atorvastatin  20 mg Oral Daily   Chlorhexidine Gluconate Cloth  6 each Topical Daily   enoxaparin (LOVENOX) injection  70 mg Subcutaneous Q24H   feeding supplement (GLUCERNA SHAKE)  237 mL Oral TID WC   gabapentin  300 mg Oral BID   insulin aspart  0-15 Units Subcutaneous TID WC   insulin aspart  0-5 Units Subcutaneous QHS   insulin aspart  3 Units Subcutaneous TID WC   insulin glargine-yfgn  25 Units Subcutaneous Daily   metoprolol tartrate  50 mg Oral BID   multivitamin with minerals  1 tablet Oral Daily   mupirocin ointment  1 Application Nasal BID   nutrition supplement (JUVEN)  1 packet Oral BID BM   phosphorus  500 mg Oral BID   potassium chloride  40 mEq Oral Q6H   Continuous Infusions:  cefTRIAXone (ROCEPHIN)  IV 2 g (11/14/23 2351)     LOS: 4 days     Huey Bienenstock , MD [facility]   11/15/2023, 2:02 PM

## 2023-11-15 NOTE — Plan of Care (Signed)

## 2023-11-15 NOTE — Progress Notes (Signed)
 Physical Therapy Treatment Patient Details Name: Taylor Hughes MRN: [id number] DOB: 04/12/1971 Today's Date: 11/15/2023   History of Present Illness 53 year old make admitted 4/1 with weakness, elevated BS and nausea and vomiting. He had not been taking his insulin or checking his BS. Also found a L foot diabetic ulcer. PMH includes R forefoot amputation , lymphedema and morbid obesity    PT Comments  Pt is slowly progressing towards goals. Currently pt is Min A with LLE for supine<>sitting and Min A for sit to stand. Pt was able to take small steps at EOB; declined sitting in recliner or W/C this session due to pt states he was up for a long time yesterday and legs/back were sore. Pt reports he has family assist at home and they can provide physical assistance. Pt asking about pain modulation techniques for L buttocks, neck and LB; Pt reports decreased pain in standing and educated pt on importance of lumbar support and upright standing to decrease stress on posterior structures of LB to decrease stress on nerves/discs. Pt was educated on stretching and easy movements to facilitate relaxation at the cervical spine.  Due to pt current functional status, home set up and available assistance at home recommending skilled physical therapy services 3x/week in order to address strength, balance and functional mobility to decrease risk for falls, injury and re-hospitalization.       If plan is discharge home, recommend the following: A little help with walking and/or transfers;A little help with bathing/dressing/bathroom;Assist for transportation;Assistance with Systems developer (measurements PT);Wheelchair cushion (measurements PT);BSC/3in1       Precautions / Restrictions Precautions Precautions: Fall Recall of Precautions/Restrictions: Intact Required Braces or Orthoses: Other Brace Other Brace: PRAFO Restrictions Weight Bearing Restrictions Per Provider  Order: No     Mobility  Bed Mobility Overal bed mobility: Needs Assistance Bed Mobility: Supine to Sit, Sit to Supine     Supine to sit: Min assist, Used rails, HOB elevated, +2 for physical assistance Sit to supine: Min assist, +2 for physical assistance   General bed mobility comments: Min A with verbal cues. 2nd person just there for support without significant physical assist. Pt requires assist with LLE for getting in/out of bed.    Transfers Overall transfer level: Needs assistance Equipment used: Rolling walker (2 wheels) Transfers: Sit to/from Stand Sit to Stand: Min assist, From elevated surface, +2 safety/equipment           General transfer comment: Min assist for boost to stand from elevated bed surface. Second person due to pt anxiety and for safety    Ambulation/Gait   Pre-gait activities: side steps at EOB with very short distance and low foot clearance.       Balance Overall balance assessment: Needs assistance Sitting-balance support: No upper extremity supported, Feet supported Sitting balance-Leahy Scale: Fair     Standing balance support: During functional activity, Single extremity supported, Reliant on assistive device for balance Standing balance-Leahy Scale: Poor Standing balance comment: needs MIn external support to prevent fall.        Communication Communication Communication: No apparent difficulties  Cognition Arousal: Alert Behavior During Therapy: Anxious   PT - Cognitive impairments: No apparent impairments       Following commands: Intact      Cueing Cueing Techniques: Verbal cues     General Comments General comments (skin integrity, edema, etc.): Pt requires assist with pericare with small BM. Pt reported decreased pain in L  buttocks with upright standing and decreased R neck pain with shoulder circles and relaxation. Vital signs were stable throughout session.      Pertinent Vitals/Pain Pain Assessment Pain  Assessment: Faces Pain Score: 9  Pain Location: LLE and Low back Pain Descriptors / Indicators: Aching, Burning Pain Intervention(s): Monitored during session, Limited activity within patient's tolerance, Patient requesting pain meds-RN notified     PT Goals (current goals can now be found in the care plan section) Acute Rehab PT Goals Patient Stated Goal: Get well, return home with family support. PT Goal Formulation: With patient Time For Goal Achievement: 11/28/23 Potential to Achieve Goals: Good Progress towards PT goals: Progressing toward goals    Frequency    Min 2X/week      PT Plan  Continue with current POC        AM-PAC PT "6 Clicks" Mobility   Outcome Measure  Help needed turning from your back to your side while in a flat bed without using bedrails?: A Little Help needed moving from lying on your back to sitting on the side of a flat bed without using bedrails?: A Little Help needed moving to and from a bed to a chair (including a wheelchair)?: A Little Help needed standing up from a chair using your arms (e.g., wheelchair or bedside chair)?: A Little Help needed to walk in hospital room?: A Little Help needed climbing 3-5 steps with a railing? : A Lot 6 Click Score: 17    End of Session Equipment Utilized During Treatment: Gait belt Activity Tolerance: Patient tolerated treatment well;Other (comment) (Pt limited by anxiety in anticipation on pain and fear of falling) Patient left: in bed;with bed alarm set;with call bell/phone within reach Nurse Communication: Mobility status PT Visit Diagnosis: Unsteadiness on feet (R26.81);Other abnormalities of gait and mobility (R26.89);Muscle weakness (generalized) (M62.81);Pain Pain - Right/Left: Left Pain - part of body:  (low back, L buttocks and R cervical area)     Time: 1610-9604 PT Time Calculation (min) (ACUTE ONLY): 39 min  Charges:    $Therapeutic Activity: 38-52 mins PT General Charges $$ ACUTE PT  VISIT: 1 Visit                    Harrel Carina, DPT, CLT  Acute Rehabilitation Services Office: [phone] (Secure chat preferred)    Claudia Desanctis 11/15/2023, 2:58 PM

## 2023-11-15 NOTE — Plan of Care (Signed)
  Problem: Clinical Measurements: Goal: Diagnostic test results will improve Outcome: Progressing   Problem: Activity: Goal: Risk for activity intolerance will decrease Outcome: Progressing   Problem: Nutrition: Goal: Adequate nutrition will be maintained Outcome: Progressing   Problem: Coping: Goal: Level of anxiety will decrease Outcome: Progressing   Problem: Elimination: Goal: Will not experience complications related to bowel motility Outcome: Progressing

## 2023-11-15 NOTE — TOC Progression Note (Signed)
 Transition of Care [facility]) - Progression Note    Patient Details  Name: Taylor Hughes MRN: [id number] Date of Birth: Oct 13, 1970  Transition of Care [facility]) CM/SW Contact  Lawerance Sabal, RN Phone Number: 11/15/2023, 11:02 AM  Clinical Narrative:     Unable to secure [facility] services due to payor source. Spoke w patient , he is understanding. He is agreeable to OP services. We discussed locations and referral was placed to [facility].  WC to be delivered to the room  Expected Discharge Plan: Home/Self Care Barriers to Discharge: Continued Medical Work up  Expected Discharge Plan and Services   Discharge Planning Services: CM Consult   Living arrangements for the past 2 months: Apartment                 DME Arranged: Community education officer wheelchair with seat cushion DME Agency: [employer] Date DME Agency Contacted: 11/14/23 Time DME Agency Contacted: [phone] Representative spoke with at DME Agency: Vaughan Basta             Social Determinants of Health (SDOH) Interventions SDOH Screenings   Food Insecurity: Food Insecurity Present (11/12/2023)  Housing: Low Risk  (11/12/2023)  Transportation Needs: No Transportation Needs (11/12/2023)  Utilities: Not At Risk (11/12/2023)  Depression (PHQ2-9): Medium Risk (06/25/2023)  Financial Resource Strain: Medium Risk (06/24/2023)  Physical Activity: Unknown (06/24/2023)  Social Connections: Socially Isolated (06/24/2023)  Stress: No Stress Concern Present (06/24/2023)  Tobacco Use: Low Risk  (11/10/2023)    Readmission Risk Interventions    03/24/2023    4:56 PM 08/15/2022    1:23 PM  Readmission Risk Prevention Plan  Transportation Screening Complete Complete  PCP or Specialist Appt within 5-7 Days  Complete  PCP or Specialist Appt within 3-5 Days Complete   Home Care Screening  Complete  Medication Review (RN CM)  Complete  HRI or Home Care Consult Complete   Social Work Consult for Recovery Care Planning/Counseling Complete    Palliative Care Screening Not Applicable   Medication Review Oceanographer) Referral to Pharmacy

## 2023-11-16 DIAGNOSIS — E1110 Type 2 diabetes mellitus with ketoacidosis without coma: Secondary | ICD-10-CM | POA: Diagnosis not present

## 2023-11-16 LAB — CULTURE, BLOOD (ROUTINE X 2)
Culture: NO GROWTH
Culture: NO GROWTH
Special Requests: ADEQUATE
Special Requests: ADEQUATE

## 2023-11-16 LAB — BASIC METABOLIC PANEL WITH GFR
Anion gap: 10 (ref 5–15)
BUN: 13 mg/dL (ref 6–20)
CO2: 20 mmol/L — ABNORMAL LOW (ref 22–32)
Calcium: 7.9 mg/dL — ABNORMAL LOW (ref 8.9–10.3)
Chloride: 108 mmol/L (ref 98–111)
Creatinine, Ser: 0.93 mg/dL (ref 0.61–1.24)
GFR, Estimated: >60 mL/min (ref >60)
Glucose, Bld: 185 mg/dL — ABNORMAL HIGH (ref 70–99)
Potassium: 3.8 mmol/L (ref 3.5–5.1)
Sodium: 138 mmol/L (ref 135–145)

## 2023-11-16 LAB — GLUCOSE, CAPILLARY
Glucose-Capillary: 241 mg/dL — ABNORMAL HIGH (ref 70–99)
Glucose-Capillary: 230 mg/dL — ABNORMAL HIGH (ref 70–99)
Glucose-Capillary: 213 mg/dL — ABNORMAL HIGH (ref 70–99)
Glucose-Capillary: 209 mg/dL — ABNORMAL HIGH (ref 70–99)
Glucose-Capillary: 140 mg/dL — ABNORMAL HIGH (ref 70–99)

## 2023-11-16 LAB — CBC
HCT: 31.1 % — ABNORMAL LOW (ref 39.0–52.0)
Hemoglobin: 10.1 g/dL — ABNORMAL LOW (ref 13.0–17.0)
MCH: 27.2 pg (ref 26.0–34.0)
MCHC: 32.5 g/dL (ref 30.0–36.0)
MCV: 83.6 fL (ref 80.0–100.0)
Platelets: 337 10*3/uL (ref 150–400)
RBC: 3.72 MIL/uL — ABNORMAL LOW (ref 4.22–5.81)
RDW: 17.7 % — ABNORMAL HIGH (ref 11.5–15.5)
WBC: 9.8 10*3/uL (ref 4.0–10.5)
nRBC: 0 % (ref 0.0–0.2)

## 2023-11-16 LAB — PHOSPHORUS: Phosphorus: 2.8 mg/dL (ref 2.5–4.6)

## 2023-11-16 MED ORDER — IRBESARTAN 300 MG PO TABS
150.0000 mg | ORAL_TABLET | Freq: Every day | ORAL | Status: DC
  Administered 2023-11-16 – 2023-11-17 (×2): 150 mg via ORAL
  Filled 2023-11-16 (×2): qty 1

## 2023-11-16 MED ORDER — INSULIN GLARGINE-YFGN 100 UNIT/ML ~~LOC~~ SOLN
27.0000 [IU] | Freq: Every day | SUBCUTANEOUS | Status: DC
  Administered 2023-11-17: 27 [IU] via SUBCUTANEOUS
  Filled 2023-11-16: qty 0.27

## 2023-11-16 MED ORDER — DOXYCYCLINE HYCLATE 100 MG PO TABS
100.0000 mg | ORAL_TABLET | Freq: Two times a day (BID) | ORAL | Status: DC
  Administered 2023-11-17: 100 mg via ORAL
  Filled 2023-11-16: qty 1

## 2023-11-16 NOTE — Progress Notes (Signed)
 Physical Therapy Treatment Patient Details Name: Taylor Hughes MRN: [id number] DOB: Mar 05, 1971 Today's Date: 11/16/2023   History of Present Illness 53 year old make admitted 4/1 with weakness, elevated BS and nausea and vomiting. He had not been taking his insulin or checking his BS. Also found a L foot diabetic ulcer. PMH includes R forefoot amputation, lymphedema and morbid obesity.    PT Comments  Making progress towards acute functional goals. Min assist for bed mobility, transfer, and short distance gait in room today. Educated on safe AD use with RW and sequencing for safe mobility. Pt motivated and encouraged by progress. Patient will continue to benefit from skilled physical therapy services to further improve independence with functional mobility. Still feels family will provide adequate support upon d/c.     If plan is discharge home, recommend the following: A little help with walking and/or transfers;A little help with bathing/dressing/bathroom;Assist for transportation;Assistance with cooking/housework   Can travel by Doctor, hospital (measurements PT);Wheelchair cushion (measurements PT);BSC/3in1    Recommendations for Other Services       Precautions / Restrictions Precautions Precautions: Fall Recall of Precautions/Restrictions: Intact Required Braces or Orthoses: Other Brace Other Brace: PRAFO Restrictions Weight Bearing Restrictions Per Provider Order: No Other Position/Activity Restrictions: Wear PRAFO per Ortho     Mobility  Bed Mobility Overal bed mobility: Needs Assistance Bed Mobility: Supine to Sit     Supine to sit: Min assist, Used rails, HOB elevated     General bed mobility comments: Able to bring his LEs off bed today without help. Min assist provided for pt to pull through therapist hand and scoot hip to EOB.    Transfers Overall transfer level: Needs assistance Equipment used: Rolling walker (2  wheels) Transfers: Sit to/from Stand Sit to Stand: Min assist, From elevated surface           General transfer comment: Min assist for light boost from elevated bed surface. Good control with descent into w/c. Reviewed safety procedures prior to sitting.    Ambulation/Gait Ambulation/Gait assistance: Min assist Gait Distance (Feet): 10 Feet Assistive device: Rolling walker (2 wheels) Gait Pattern/deviations: Step-to pattern, Decreased stance time - right, Antalgic Gait velocity: dec Gait velocity interpretation: <1.31 ft/sec, indicative of household ambulator   General Gait Details: Educated on RW use, sequencing to maximize stability with UE support. Min assist for RW control. Took steps forward, backwards, and turning with this device. Cues throughout.   Stairs             Wheelchair Mobility     Tilt Bed    Modified Rankin (Stroke Patients Only)       Balance Overall balance assessment: Needs assistance Sitting-balance support: No upper extremity supported, Feet supported Sitting balance-Leahy Scale: Fair     Standing balance support: Single extremity supported, Reliant on assistive device for balance Standing balance-Leahy Scale: Poor Standing balance comment: RW for support                            Communication Communication Communication: No apparent difficulties  Cognition Arousal: Alert Behavior During Therapy: Anxious   PT - Cognitive impairments: No apparent impairments                         Following commands: Intact      Cueing Cueing Techniques: Verbal cues  Exercises General Exercises -  Lower Extremity Short Arc Quad: Strengthening, Left, 5 reps, Supine Long Arc Quad: Strengthening, Left, 5 reps, Seated    General Comments        Pertinent Vitals/Pain Pain Assessment Pain Assessment: Faces Faces Pain Scale: Hurts little more Pain Location: LLE and neck Pain Descriptors / Indicators: Aching Pain  Intervention(s): Monitored during session, Repositioned    Home Living                          Prior Function            PT Goals (current goals can now be found in the care plan section) Acute Rehab PT Goals Patient Stated Goal: Get well, return home with family support. PT Goal Formulation: With patient Time For Goal Achievement: 11/28/23 Potential to Achieve Goals: Good Progress towards PT goals: Progressing toward goals    Frequency    Min 2X/week      PT Plan      Co-evaluation              AM-PAC PT "6 Clicks" Mobility   Outcome Measure  Help needed turning from your back to your side while in a flat bed without using bedrails?: A Little Help needed moving from lying on your back to sitting on the side of a flat bed without using bedrails?: A Little Help needed moving to and from a bed to a chair (including a wheelchair)?: A Little Help needed standing up from a chair using your arms (e.g., wheelchair or bedside chair)?: A Little Help needed to walk in hospital room?: A Little Help needed climbing 3-5 steps with a railing? : A Lot 6 Click Score: 17    End of Session Equipment Utilized During Treatment: Gait belt Activity Tolerance: Patient tolerated treatment well Patient left: with call bell/phone within reach;with chair alarm set (w/c) Nurse Communication: Mobility status PT Visit Diagnosis: Unsteadiness on feet (R26.81);Other abnormalities of gait and mobility (R26.89);Muscle weakness (generalized) (M62.81);Pain Pain - Right/Left: Left Pain - part of body:  (Lt foot and neck)     Time: 1010-1033 PT Time Calculation (min) (ACUTE ONLY): 23 min  Charges:    $Gait Training: 8-22 mins $Therapeutic Activity: 8-22 mins PT General Charges $$ ACUTE PT VISIT: 1 Visit                     Kathlyn Sacramento, PT, DPT [facility]  Rehabilitation Services Physical Therapist Office: [phone] Website: [email or web address]    Berton Mount 11/16/2023, 11:26 AM

## 2023-11-16 NOTE — Inpatient Diabetes Management (Signed)
 Inpatient Diabetes Program Recommendations  AACE/ADA: New Consensus Statement on Inpatient Glycemic Control (2015)  Target Ranges:  Prepandial:   less than 140 mg/dL      Peak postprandial:   less than 180 mg/dL (1-2 hours)      Critically ill patients:  140 - 180 mg/dL    Latest Reference Range & Units 11/15/23 08:06 11/15/23 12:42 11/15/23 16:17 11/15/23 20:23  Glucose-Capillary 70 - 99 mg/dL 846 (H) 962 (H) 952 (H) 123 (H)  (H): Data is abnormally high  Latest Reference Range & Units 11/16/23 08:13 11/16/23 09:32 11/16/23 12:35  Glucose-Capillary 70 - 99 mg/dL 841 (H) 324 (H) 401 (H)  (H): Data is abnormally high     Home DM Meds: Lantus 10 units daily     Humalog 0-12 units TID per SSI  Current Orders: Semglee 25 units daily  Novolog 0-15 units TID ac/hs   Novolog 3 units TID with meals   MD- Note CBGs are elevated today.  If this trend continues, please consider:  Increase Semglee very slightly to 27 units daily  Looks like PO intake has been poor     --Will follow patient during hospitalization--  Ambrose Finland RN, MSN, CDCES Diabetes Coordinator Inpatient Glycemic Control Team Team Pager: [phone] (8a-5p)

## 2023-11-16 NOTE — Progress Notes (Signed)
 Occupational Therapy Treatment Patient Details Name: Taylor Hughes MRN: [id number] DOB: 1971/07/12 Today's Date: 11/16/2023   History of present illness 53 year old make admitted 4/1 with weakness, elevated BS and nausea and vomiting. He had not been taking his insulin or checking his BS. Also found a L foot diabetic ulcer. PMH includes R forefoot amputation, lymphedema and morbid obesity.   OT comments  Limited session as patient refused OOB, stating he was fatigued from PT and was uncomfortable in the chair.  Stated he was going home tomorrow and he has arranged caregivers to assist him with his self care at home.  Instructed patient on tub transfer bench, its use and where to purchase.  He verbalized understanding and stated he planned to pursue getting the equipment. No follow up OT recommended as he states he has help set up at home, will continue to follow on acute.       If plan is discharge home, recommend the following:  A lot of help with walking and/or transfers;A little help with bathing/dressing/bathroom;Assistance with cooking/housework;Assist for transportation;Help with stairs or ramp for entrance   Equipment Recommendations  Tub/shower bench    Recommendations for Other Services      Precautions / Restrictions Precautions Precautions: Fall Recall of Precautions/Restrictions: Intact Required Braces or Orthoses: Other Brace Other Brace: PRAFO Restrictions Weight Bearing Restrictions Per Provider Order: No Other Position/Activity Restrictions: Wear PRAFO per Ortho       Mobility Bed Mobility                    Transfers                         Balance                                           ADL either performed or assessed with clinical judgement   ADL Overall ADL's : Needs assistance/impaired Eating/Feeding: Independent   Grooming: Independent;Sitting                                 General ADL Comments:  Patient refused to sit EOB or attempt transfers.  He had worked with PT and stated that it wears him out and it hurts.  Patient stated he was leaving tomorrow and has set up a lot of assistance in the home to help him with adls.  I instructed him on DME available to assist with tub transfers and toileting and  reviewed step by step how the tub seat works. He verbalized understanding of the equipment.    Extremity/Trunk Assessment Upper Extremity Assessment Upper Extremity Assessment: Overall WFL for tasks assessed   Lower Extremity Assessment Lower Extremity Assessment: Defer to PT evaluation        Vision   Vision Assessment?: No apparent visual deficits   Perception     Praxis     Communication Communication Communication: No apparent difficulties   Cognition Arousal: Alert Behavior During Therapy:  (Patient refused OOB with OT)                                 Following commands: Intact        Cueing      Exercises  Shoulder Instructions       General Comments Very limited session    Pertinent Vitals/ Pain       Pain Assessment Pain Assessment: No/denies pain Pain Intervention(s): Monitored during session  Home Living                                          Prior Functioning/Environment              Frequency  Min 2X/week        Progress Toward Goals  OT Goals(current goals can now be found in the care plan section)  Progress towards OT goals: Progressing toward goals  Acute Rehab OT Goals OT Goal Formulation: With patient Time For Goal Achievement: 11/27/23 Potential to Achieve Goals: Good ADL Goals Pt Will Perform Upper Body Bathing: with min assist;sitting Pt Will Perform Lower Body Bathing: sit to/from stand;with min assist Pt Will Perform Upper Body Dressing: with min assist;sitting Pt Will Perform Lower Body Dressing: with min assist;sit to/from stand Pt Will Transfer to Toilet: with min  assist;ambulating Pt Will Perform Toileting - Clothing Manipulation and hygiene: with min assist;sit to/from stand  Plan      Co-evaluation                 AM-PAC OT "6 Clicks" Daily Activity     Outcome Measure   Help from another person eating meals?: None Help from another person taking care of personal grooming?: None Help from another person toileting, which includes using toliet, bedpan, or urinal?: A Lot Help from another person bathing (including washing, rinsing, drying)?: A Lot Help from another person to put on and taking off regular upper body clothing?: A Lot Help from another person to put on and taking off regular lower body clothing?: A Lot 6 Click Score: 16    End of Session    OT Visit Diagnosis: Pain;Other abnormalities of gait and mobility (R26.89);Unsteadiness on feet (R26.81) Pain - Right/Left: Left Pain - part of body: Leg   Activity Tolerance Other (comment) (Patient refused OOB)   Patient Left in bed;with call bell/phone within reach   Nurse Communication          Time: 1540-1600 OT Time Calculation (min): 20 min  Charges: OT General Charges $OT Visit: 1 Visit OT Treatments $Self Care/Home Management : 8-22 mins  Hal Neer OTR/L   [name] 11/16/2023, 4:31 PM

## 2023-11-16 NOTE — Progress Notes (Signed)
 PROGRESS NOTE    Taylor Hughes  ZOX:[id number] DOB: [birth year] DOA: 11/10/2023 PCP: Hoy Register, MD  53/M with history of type 2 diabetes mellitus, DKA, gastroparesis, right foot transmetatarsal amputation, lymphedema, morbid obesity presented to the ED with weakness, elevated blood sugars, some nausea vomiting and diarrhea, has not been taking insulin or checking blood sugars as he feels poorly, in the ED he was noted to have significant sinus tachycardia, CBGs 560s, bicarb of 9, diabetic ketoacidosis, creatinine 2.0, WBC 15.1 -CT abdomen with bilateral perinephric stranding   Subjective: No significant events overnight, he denies any complaints today, tolerating sitting at bedside chair yesterday  Assessment and Plan:  Sepsis, POA Diabetic foot wound -Source of sepsis most likely related to left foot diabetic wound, MRI of left foot has been obtained, fortunately no evidence of abscess osteomyelitis - Blood cultures remain negative, will discontinue vancomycin and continue with Rocephin - Orthopedic input greatly appreciated, continue with Profore boot, and routine dressing changes Dr. [name] to follow as an outpatient after discharge  - Dr. [name] input greatly appreciated, continue with wound care and [facility] boot left foot - Leukocytosis and procalcitonin trending down - Will narrow all his antibiotics to doxycycline from tomorrow  Diabetes mellitus, type II, uncontrolled with hyperglycemia -A1c is 9.2 -Discussed with the patient, reports poor compliance few days prior to admission. -Transitioned off insulin drip to subcu insulin as anion gap has closed.  All CBG is much improved after increasing his Levemir, will up again today to 27 units given some minimal elevations.  Blood pressure  - Remains elevated, will add losartan to metoprolol.  Paroxysmal atrial fibrillation -Not on anticoagulation at baseline, currently with sinus tachycardia, continue metoprolol, dose increased  ?   Pyelonephritis -Some perinephric stranding noted on CT yesterday, has chronic back pain, doubt related to this, urinalysis pending, UA is negative, h  Acute kidney injury -In the setting of DKA and dehydration, improving  Chronic anemia -Follow-up anemia panel  Severe hypophosphatemia Hypokalemia -replaced  Chronic diastolic CHF -Echo 8/24 with EF 75%, indeterminate diastolic function  2 cm right adrenal nodule -further  workup and imaging as an outpatient   DVT prophylaxis:  Lovenox Code Status: Full code Family Communication: None present Disposition Plan: Home with home health tomorrow as his wife will be at home as she is on hemodialysis today.  Consultants:  PCCM Orthopedic  Procedures:   Antimicrobials:    Objective: Vitals:   11/16/23 0229 11/16/23 0822 11/16/23 0933 11/16/23 1235  BP:  (!) 163/91 124/74 (!) 148/84  Pulse: 88 97 91 91  Resp: 16 19 14 18   Temp:  98.4 F (36.9 C) 98.8 F (37.1 C) 98.8 F (37.1 C)  TempSrc:   Oral Oral  SpO2: 96% 96% 97% 97%  Weight:      Height:        Intake/Output Summary (Last 24 hours) at 11/16/2023 1458 Last data filed at 11/16/2023 0300 Gross per 24 hour  Intake 300 ml  Output --  Net 300 ml    Filed Weights   11/10/23 1517 11/11/23 0400 11/11/23 1528  Weight: (!) 142.4 kg 128.6 kg (!) 137.4 kg    Examination:  Awake, Alert, no apparent distress Good air entry Regular rate and rhythm Abdomen soft No Cyanosis, Clubbing, baseline significant lymphedema, right TMA, left foot on PRAFO boot    Data Reviewe 1 d:   CBC: Recent Labs  Lab 11/10/23 1922 11/11/23 0319 11/12/23 0449 11/13/23 1117 11/14/23 0548 11/15/23 8119 11/16/23  0329  WBC 18.9*   < > 15.0* 12.0* 12.1* 12.1* 9.8  NEUTROABS 17.4*  --   --   --   --   --   --   HGB 11.3*   < > 11.0* 11.0* 10.5* 11.0* 10.1*  HCT 35.6*   < > 34.2* 33.5* 31.9* 34.2* 31.1*  MCV 86.4   < > 84.4 83.3 82.6 84.7 83.6  PLT 314   < > 304 324 333 331 337   <  > = values in this interval not displayed.   Basic Metabolic Panel: Recent Labs  Lab 11/11/23 0917 11/11/23 1757 11/12/23 0449 11/12/23 1652 11/13/23 1117 11/14/23 0548 11/15/23 0638 11/16/23 0329  NA  --    < > 135 134* 135 137 137 138  K  --    < > 4.0 4.4 3.3* 3.7 3.4* 3.8  CL  --    < > 103 108 105 108 106 108  CO2  --    < > 15* 19* 19* 18* 19* 20*  GLUCOSE  --    < > 299* 276* 249* 205* 165* 185*  BUN  --    < > 38* 34* 25* 19 16 13   CREATININE  --    < > 1.31* 1.14 1.03 0.93 1.14 0.93  CALCIUM  --    < > 8.5* 8.1* 8.3* 8.1* 8.1* 7.9*  MG 1.8  --  2.0  --  2.0  --   --   --   PHOS <1.0*  --  2.1*  --  2.2* 2.7 2.9 2.8   < > = values in this interval not displayed.   GFR: Estimated Creatinine Clearance: 133 mL/min (by C-G formula based on SCr of 0.93 mg/dL). Liver Function Tests: Recent Labs  Lab 11/10/23 1922 11/12/23 0449  AST 15 15  ALT 12 10  ALKPHOS 102 76  BILITOT 1.6* 0.8  PROT 7.0 6.5  ALBUMIN 2.0* 1.8*   No results for input(s): "LIPASE", "AMYLASE" in the last 168 hours. No results for input(s): "AMMONIA" in the last 168 hours. Coagulation Profile: No results for input(s): "INR", "PROTIME" in the last 168 hours. Cardiac Enzymes: Recent Labs  Lab 11/10/23 1922  CKTOTAL 88   BNP (last 3 results) No results for input(s): "PROBNP" in the last 8760 hours. HbA1C: No results for input(s): "HGBA1C" in the last 72 hours.  CBG: Recent Labs  Lab 11/15/23 1617 11/15/23 2023 11/16/23 0813 11/16/23 0932 11/16/23 1235  GLUCAP 144* 123* 241* 230* 213*   Lipid Profile: No results for input(s): "CHOL", "HDL", "LDLCALC", "TRIG", "CHOLHDL", "LDLDIRECT" in the last 72 hours.  Thyroid Function Tests: No results for input(s): "TSH", "T4TOTAL", "FREET4", "T3FREE", "THYROIDAB" in the last 72 hours. Anemia Panel: No results for input(s): "VITAMINB12", "FOLATE", "FERRITIN", "TIBC", "IRON", "RETICCTPCT" in the last 72 hours. Urine analysis:    Component  Value Date/Time   COLORURINE YELLOW 11/11/2023 1852   APPEARANCEUR HAZY (A) 11/11/2023 1852   APPEARANCEUR Clear 06/25/2023 1537   LABSPEC 1.029 11/11/2023 1852   PHURINE 6.0 11/11/2023 1852   GLUCOSEU 50 (A) 11/11/2023 1852   HGBUR SMALL (A) 11/11/2023 1852   BILIRUBINUR NEGATIVE 11/11/2023 1852   BILIRUBINUR Negative 06/25/2023 1537   KETONESUR 20 (A) 11/11/2023 1852   PROTEINUR >=300 (A) 11/11/2023 1852   UROBILINOGEN 0.2 11/14/2021 1146   NITRITE NEGATIVE 11/11/2023 1852   LEUKOCYTESUR NEGATIVE 11/11/2023 1852   Sepsis Labs: @LABRCNTIP (procalcitonin:4,lacticidven:4)  ) Recent Results (from the past 240 hours)  Resp panel by RT-PCR (RSV, Flu A&B, Covid) Urine, Clean Catch     Status: None   Collection Time: 11/10/23  3:55 PM   Specimen: Urine, Clean Catch; Nasal Swab  Result Value Ref Range Status   SARS Coronavirus 2 by RT PCR NEGATIVE NEGATIVE Final   Influenza A by PCR NEGATIVE NEGATIVE Final   Influenza B by PCR NEGATIVE NEGATIVE Final    Comment: (NOTE) The Xpert Xpress SARS-CoV-2/FLU/RSV plus assay is intended as an aid in the diagnosis of influenza from Nasopharyngeal swab specimens and should not be used as a sole basis for treatment. Nasal washings and aspirates are unacceptable for Xpert Xpress SARS-CoV-2/FLU/RSV testing.  Fact Sheet for Patients: BloggerCourse.com  Fact Sheet for Healthcare Providers: SeriousBroker.it  This test is not yet approved or cleared by the [place] FDA and has been authorized for detection and/or diagnosis of SARS-CoV-2 by FDA under an Emergency Use Authorization (EUA). This EUA will remain in effect (meaning this test can be used) for the duration of the COVID-19 declaration under Section 564(b)(1) of the Act, 21 U.S.C. section 360bbb-3(b)(1), unless the authorization is terminated or revoked.     Resp Syncytial Virus by PCR NEGATIVE NEGATIVE Final    Comment:  (NOTE) Fact Sheet for Patients: BloggerCourse.com  Fact Sheet for Healthcare Providers: SeriousBroker.it  This test is not yet approved or cleared by the [place] FDA and has been authorized for detection and/or diagnosis of SARS-CoV-2 by FDA under an Emergency Use Authorization (EUA). This EUA will remain in effect (meaning this test can be used) for the duration of the COVID-19 declaration under Section 564(b)(1) of the Act, 21 U.S.C. section 360bbb-3(b)(1), unless the authorization is terminated or revoked.  Performed at [facility], [address], [place], [place] [address]   Urine Culture (for pregnant, neutropenic or urologic patients or patients with an indwelling urinary catheter)     Status: Abnormal   Collection Time: 11/11/23  2:34 AM   Specimen: Urine, Clean Catch  Result Value Ref Range Status   Specimen Description URINE, CLEAN CATCH  Final   Special Requests   Final    NONE Performed at [facility], [address], [place], [place] [address]    Culture MULTIPLE SPECIES PRESENT, SUGGEST RECOLLECTION (A)  Final   Report Status 11/12/2023 FINAL  Final  Culture, blood (Routine X 2) w Reflex to ID Panel     Status: None   Collection Time: 11/11/23  3:08 AM   Specimen: BLOOD LEFT HAND  Result Value Ref Range Status   Specimen Description BLOOD LEFT HAND  Final   Special Requests   Final    BOTTLES DRAWN AEROBIC AND ANAEROBIC Blood Culture adequate volume   Culture   Final    NO GROWTH 5 DAYS Performed at [facility], [address] [place], [place] [address]    Report Status 11/16/2023 FINAL  Final  Culture, blood (Routine X 2) w Reflex to ID Panel     Status: None   Collection Time: 11/11/23  3:19 AM   Specimen: BLOOD LEFT HAND  Result Value Ref Range Status   Specimen Description BLOOD LEFT HAND  Final   Special Requests   Final    BOTTLES DRAWN AEROBIC AND ANAEROBIC Blood Culture  adequate volume   Culture   Final    NO GROWTH 5 DAYS Performed at [facility], [address] [place], [place] [address]    Report  Status 11/16/2023 FINAL  Final  MRSA Next Gen by PCR, Nasal     Status: Abnormal   Collection Time: 11/11/23  4:36 AM   Specimen: Nasal Mucosa; Nasal Swab  Result Value Ref Range Status   MRSA by PCR Next Gen DETECTED (A) NOT DETECTED Final    Comment: RESULT CALLED TO, READ BACK BY AND VERIFIED WITH: B WARNER,RN@0610  11/11/23 MK (NOTE) The GeneXpert MRSA Assay (FDA approved for NASAL specimens only), is one component of a comprehensive MRSA colonization surveillance program. It is not intended to diagnose MRSA infection nor to guide or monitor treatment for MRSA infections. Test performance is not FDA approved in patients less than 23 years old. Performed at [facility], [address], [place], [place] [address]      Radiology Studies: No results found.    Scheduled Meds:  atorvastatin  20 mg Oral Daily   enoxaparin (LOVENOX) injection  70 mg Subcutaneous Q24H   feeding supplement (GLUCERNA SHAKE)  237 mL Oral TID WC   gabapentin  300 mg Oral BID   insulin aspart  0-15 Units Subcutaneous TID WC   insulin aspart  0-5 Units Subcutaneous QHS   insulin aspart  3 Units Subcutaneous TID WC   insulin glargine-yfgn  25 Units Subcutaneous Daily   irbesartan  150 mg Oral Daily   metoprolol tartrate  50 mg Oral BID   multivitamin with minerals  1 tablet Oral Daily   nutrition supplement (JUVEN)  1 packet Oral BID BM   phosphorus  500 mg Oral BID   Continuous Infusions:  cefTRIAXone (ROCEPHIN)  IV 2 g (11/16/23 0027)     LOS: 5 days     Huey Bienenstock , MD [facility]   11/16/2023, 2:58 PM

## 2023-11-16 NOTE — Plan of Care (Signed)

## 2023-11-16 NOTE — Progress Notes (Signed)
 Patient refused to stay on chair requested to be taken back to bed within around 15 mins of staying up.RN and NT helped him to go back to bed.Call bell on reach.

## 2023-11-17 ENCOUNTER — Other Ambulatory Visit (HOSPITAL_COMMUNITY): Payer: Self-pay

## 2023-11-17 DIAGNOSIS — N179 Acute kidney failure, unspecified: Secondary | ICD-10-CM | POA: Diagnosis not present

## 2023-11-17 DIAGNOSIS — E1110 Type 2 diabetes mellitus with ketoacidosis without coma: Secondary | ICD-10-CM | POA: Diagnosis not present

## 2023-11-17 LAB — GLUCOSE, CAPILLARY
Glucose-Capillary: 128 mg/dL — ABNORMAL HIGH (ref 70–99)
Glucose-Capillary: 149 mg/dL — ABNORMAL HIGH (ref 70–99)

## 2023-11-17 MED ORDER — DOXYCYCLINE HYCLATE 100 MG PO TABS
100.0000 mg | ORAL_TABLET | Freq: Two times a day (BID) | ORAL | 0 refills | Status: AC
  Filled 2023-11-17: qty 6, 3d supply, fill #0

## 2023-11-17 MED ORDER — OXYCODONE HCL 5 MG PO TABS
5.0000 mg | ORAL_TABLET | Freq: Three times a day (TID) | ORAL | 0 refills | Status: AC
  Filled 2023-11-17: qty 15, 5d supply, fill #0

## 2023-11-17 MED ORDER — METOPROLOL TARTRATE 50 MG PO TABS
50.0000 mg | ORAL_TABLET | Freq: Two times a day (BID) | ORAL | 0 refills | Status: DC
  Filled 2023-11-17: qty 60, 30d supply, fill #0

## 2023-11-17 MED ORDER — LANTUS SOLOSTAR 100 UNIT/ML ~~LOC~~ SOPN
27.0000 [IU] | PEN_INJECTOR | Freq: Every day | SUBCUTANEOUS | 0 refills | Status: DC
  Filled 2023-11-17 (×2): qty 15, 55d supply, fill #0

## 2023-11-17 NOTE — TOC Transition Note (Signed)
 Transition of Care [facility]) - Discharge Note   Patient Details  Name: Taylor Hughes MRN: [id number] Date of Birth: 07/17/1971  Transition of Care [facility]) CM/SW Contact:  Gordy Clement, RN Phone Number: 11/17/2023, 10:51 AM   Clinical Narrative:    Patient to dc to home today. Family will transport. OP Therapy referred and AVS has been updated        Barriers to Discharge: Continued Medical Work up   Patient Goals and CMS Choice Patient states their goals for this hospitalization and ongoing recovery are:: to return home          Discharge Placement                       Discharge Plan and Services Additional resources added to the After Visit Summary for     Discharge Planning Services: CM Consult            DME Arranged: Community education officer wheelchair with seat cushion DME Agency: [employer] Date DME Agency Contacted: 11/14/23 Time DME Agency Contacted: [phone] Representative spoke with at DME Agency: Vaughan Basta            Social Drivers of Health (SDOH) Interventions SDOH Screenings   Food Insecurity: Food Insecurity Present (11/12/2023)  Housing: Low Risk  (11/12/2023)  Transportation Needs: No Transportation Needs (11/12/2023)  Utilities: Not At Risk (11/12/2023)  Depression (PHQ2-9): Medium Risk (06/25/2023)  Financial Resource Strain: Medium Risk (06/24/2023)  Physical Activity: Unknown (06/24/2023)  Social Connections: Socially Isolated (06/24/2023)  Stress: No Stress Concern Present (06/24/2023)  Tobacco Use: Low Risk  (11/10/2023)     Readmission Risk Interventions    03/24/2023    4:56 PM 08/15/2022    1:23 PM  Readmission Risk Prevention Plan  Transportation Screening Complete Complete  PCP or Specialist Appt within 5-7 Days  Complete  PCP or Specialist Appt within 3-5 Days Complete   Home Care Screening  Complete  Medication Review (RN CM)  Complete  HRI or Home Care Consult Complete   Social Work Consult for Recovery Care  Planning/Counseling Complete   Palliative Care Screening Not Applicable   Medication Review Oceanographer) Referral to Pharmacy

## 2023-11-17 NOTE — Plan of Care (Signed)
 Problem: Education: Goal: Knowledge of General Education information will improve Description: Including pain rating scale, medication(s)/side effects and non-pharmacologic comfort measures Outcome: Adequate for Discharge   Problem: Health Behavior/Discharge Planning: Goal: Ability to manage health-related needs will improve Outcome: Adequate for Discharge   Problem: Clinical Measurements: Goal: Ability to maintain clinical measurements within normal limits will improve Outcome: Adequate for Discharge Goal: Will remain free from infection Outcome: Adequate for Discharge Goal: Diagnostic test results will improve Outcome: Adequate for Discharge Goal: Respiratory complications will improve Outcome: Adequate for Discharge Goal: Cardiovascular complication will be avoided Outcome: Adequate for Discharge   Problem: Activity: Goal: Risk for activity intolerance will decrease Outcome: Adequate for Discharge   Problem: Nutrition: Goal: Adequate nutrition will be maintained Outcome: Adequate for Discharge   Problem: Coping: Goal: Level of anxiety will decrease Outcome: Adequate for Discharge   Problem: Elimination: Goal: Will not experience complications related to bowel motility Outcome: Adequate for Discharge Goal: Will not experience complications related to urinary retention Outcome: Adequate for Discharge   Problem: Pain Managment: Goal: General experience of comfort will improve and/or be controlled Outcome: Adequate for Discharge   Problem: Safety: Goal: Ability to remain free from injury will improve Outcome: Adequate for Discharge   Problem: Skin Integrity: Goal: Risk for impaired skin integrity will decrease Outcome: Adequate for Discharge   Problem: Education: Goal: Ability to describe self-care measures that may prevent or decrease complications (Diabetes Survival Skills Education) will improve Outcome: Adequate for Discharge Goal: Individualized Educational  Video(s) Outcome: Adequate for Discharge   Problem: Coping: Goal: Ability to adjust to condition or change in health will improve Outcome: Adequate for Discharge   Problem: Fluid Volume: Goal: Ability to maintain a balanced intake and output will improve Outcome: Adequate for Discharge   Problem: Health Behavior/Discharge Planning: Goal: Ability to identify and utilize available resources and services will improve Outcome: Adequate for Discharge Goal: Ability to manage health-related needs will improve Outcome: Adequate for Discharge   Problem: Metabolic: Goal: Ability to maintain appropriate glucose levels will improve Outcome: Adequate for Discharge   Problem: Nutritional: Goal: Maintenance of adequate nutrition will improve Outcome: Adequate for Discharge Goal: Progress toward achieving an optimal weight will improve Outcome: Adequate for Discharge   Problem: Skin Integrity: Goal: Risk for impaired skin integrity will decrease Outcome: Adequate for Discharge   Problem: Tissue Perfusion: Goal: Adequacy of tissue perfusion will improve Outcome: Adequate for Discharge   Problem: Education: Goal: Ability to describe self-care measures that may prevent or decrease complications (Diabetes Survival Skills Education) will improve Outcome: Adequate for Discharge Goal: Individualized Educational Video(s) Outcome: Adequate for Discharge   Problem: Cardiac: Goal: Ability to maintain an adequate cardiac output will improve Outcome: Adequate for Discharge   Problem: Health Behavior/Discharge Planning: Goal: Ability to identify and utilize available resources and services will improve Outcome: Adequate for Discharge Goal: Ability to manage health-related needs will improve Outcome: Adequate for Discharge   Problem: Fluid Volume: Goal: Ability to achieve a balanced intake and output will improve Outcome: Adequate for Discharge   Problem: Metabolic: Goal: Ability to maintain  appropriate glucose levels will improve Outcome: Adequate for Discharge   Problem: Nutritional: Goal: Maintenance of adequate nutrition will improve Outcome: Adequate for Discharge Goal: Maintenance of adequate weight for body size and type will improve Outcome: Adequate for Discharge   Problem: Respiratory: Goal: Will regain and/or maintain adequate ventilation Outcome: Adequate for Discharge   Problem: Urinary Elimination: Goal: Ability to achieve and maintain adequate renal perfusion and  functioning will improve Outcome: Adequate for Discharge

## 2023-11-17 NOTE — Discharge Summary (Signed)
 Physician Discharge Summary  Taylor Hughes ZOX:[id number] DOB: 1970/10/18 DOA: 11/10/2023  PCP: Hoy Register, MD  Admit date: 11/10/2023 Discharge date: 11/17/2023  Admitted From: (Home) Disposition:  (Home)  Recommendations for Outpatient Follow-up:  Follow up with PCP in 1-2 weeks Please obtain BMP/CBC in one week Patient will need further workup as an outpatient for 2 cm right adrenal nodule    Diet recommendation: Heart Healthy / Carb Modified  Brief/Interim Summary:  52/M with history of type 2 diabetes mellitus, DKA, gastroparesis, right foot transmetatarsal amputation, lymphedema, morbid obesity presented to the ED with weakness, elevated blood sugars, some nausea vomiting and diarrhea, has not been taking insulin or checking blood sugars as he feels poorly, in the ED he was noted to have significant sinus tachycardia, CBGs 560s, bicarb of 9, diabetic ketoacidosis, creatinine 2.0, WBC 15.1, CT abdomen with bilateral perinephric stranding but he denies any urinary symptoms, and UA was negative, he was noted to have diabetic foot wound, infected, but MRI did not show any evidence of abscess or osteomyelitis, much improved with IV antibiotics, seen and evaluated by orthopedic.   Sepsis, POA Diabetic foot wound section. -Source of sepsis most likely related to left foot diabetic wound, MRI of left foot has been obtained, fortunately no evidence of abscess osteomyelitis - Blood cultures remain negative, but it has been narrowed, initially on vancomycin and Rocephin, he will be discharged on another 3 days of doxycycline.   - Orthopedic input greatly appreciated, continue with wound care and PRAFO boot left foot,Dr. [name] to follow as an outpatient after discharge  - Leukocytosis and procalcitonin trending down   Diabetes mellitus, type II, uncontrolled with hyperglycemia, DKA on presentation -A1c is 9.2 -Discussed with the patient, reports poor compliance few days prior to  admission. -Transitioned off insulin drip to subcu insulin as anion gap has closed.  All CBG is much improved after increasing his Levemir, his insulin dose has been changed, he will be discharged on 27 units on discharge, and sliding scale.  Blood pressure  - Blood pressure is elevated, resumed on home dose losartan and increased his metoprolol.   Paroxysmal atrial fibrillation -Not on anticoagulation at baseline, currently with sinus tachycardia, continue metoprolol, dose increased   ?  Pyelonephritis -Some perinephric stranding noted on CT yesterday, has chronic back pain, doubt related to this, urinalysis pending, UA is negative,    Acute kidney injury -In the setting of DKA and dehydration, improving, back to baseline   Chronic anemia - At baseline.   Severe hypophosphatemia Hypokalemia -replaced   Chronic diastolic CHF -Echo 8/24 with EF 75%, indeterminate diastolic function, he is euvolemic   2 cm right adrenal nodule -further  workup and imaging as an outpatient    Discharge Diagnoses:  Principal Problem:   DKA, type 2 (HCC) Active Problems:   DKA (diabetic ketoacidosis) (HCC)   Essential hypertension   AKI (acute kidney injury) (HCC)   Morbid obesity with BMI of 45.0-49.9, adult (HCC)   Anemia   Atrial fibrillation, chronic (HCC)   HLD (hyperlipidemia)   Gastroparesis   Lymphedema   Flank pain   Left thigh pain   Neck pain   Elevated troponin   Lactic acidosis   Acute pyelonephritis   Non-pressure chronic ulcer of other part of left foot limited to breakdown of skin (HCC)   Charcot joint of left foot    Discharge Instructions  Discharge Instructions     Diet - low sodium heart healthy   Complete by:  As directed    Discharge instructions   Complete by: As directed    Follow with Primary MD Hoy Register, MD in 7 days   Get CBC, CMP,  checked  by Primary MD next visit.    Activity: As tolerated with Full fall precautions use walker/cane &  assistance as needed   Disposition Home    Diet: Heart Healthy /carb modified,  On your next visit with your primary care physician please Get Medicines reviewed and adjusted.   Please request your Prim.MD to go over all Hospital Tests and Procedure/Radiological results at the follow up, please get all Hospital records sent to your Prim MD by signing hospital release before you go home.   If you experience worsening of your admission symptoms, develop shortness of breath, life threatening emergency, suicidal or homicidal thoughts you must seek medical attention immediately by calling 911 or calling your MD immediately  if symptoms less severe.  You Must read complete instructions/literature along with all the possible adverse reactions/side effects for all the Medicines you take and that have been prescribed to you. Take any new Medicines after you have completely understood and accpet all the possible adverse reactions/side effects.   Do not drive, operating heavy machinery, perform activities at heights, swimming or participation in water activities or provide baby sitting services if your were admitted for syncope or siezures until you have seen by Primary MD or a Neurologist and advised to do so again.  Do not drive when taking Pain medications.    Do not take more than prescribed Pain, Sleep and Anxiety Medications  Special Instructions: If you have smoked or chewed Tobacco  in the last 2 yrs please stop smoking, stop any regular Alcohol  and or any Recreational drug use.  Wear Seat belts while driving.   Please note  You were cared for by a hospitalist during your hospital stay. If you have any questions about your discharge medications or the care you received while you were in the hospital after you are discharged, you can call the unit and asked to speak with the hospitalist on call if the hospitalist that took care of you is not available. Once you are discharged, your  primary care physician will handle any further medical issues. Please note that NO REFILLS for any discharge medications will be authorized once you are discharged, as it is imperative that you return to your primary care physician (or establish a relationship with a primary care physician if you do not have one) for your aftercare needs so that they can reassess your need for medications and monitor your lab values.   Discharge wound care:   Complete by: As directed    : Cleanse L foot wound with Vashe wound cleanser Hart [place] [phone]), do not rinse and allow to air dry.  Pack wound with a piece of silver hydrofiber (Aquacel [place] [phone]) cut to fit wound bed.  Cover with dry gauze and Kerlix roll gauze or silicone foam whichever is preferred by patient.   Increase activity slowly   Complete by: As directed       Allergies as of 11/17/2023   No Known Allergies      Medication List     TAKE these medications    Accu-Chek Guide test strip Generic drug: glucose blood Use to check blood sugar 3 times daily.   Accu-Chek Guide Test test strip Generic drug: glucose blood use as directed 3 (three) times daily.   Accu-Chek Guide  w/Device Kit Use to check blood sugar 3 times daily.   Accu-Chek Softclix Lancets lancets Use to check blood sugar 3 times daily.   atorvastatin 20 MG tablet Commonly known as: LIPITOR Take 1 tablet (20 mg total) by mouth daily.   doxycycline 100 MG tablet Commonly known as: VIBRA-TABS Take 1 tablet (100 mg total) by mouth every 12 (twelve) hours for 3 days.   gabapentin 300 MG capsule Commonly known as: NEURONTIN Take 1 capsule (300 mg total) by mouth 2 (two) times daily.   Icy Hot Back 5 % Ptch Generic drug: Menthol (Topical Analgesic) Apply 1 patch topically daily.   insulin lispro 100 UNIT/ML KwikPen Commonly known as: HumaLOG KwikPen Inject 0-12 units into the skin three times daily with meals. Per sliding scale.   Lantus SoloStar 100  UNIT/ML Solostar Pen Generic drug: insulin glargine Inject 27 Units into the skin daily. What changed: how much to take   metoprolol tartrate 50 MG tablet Commonly known as: LOPRESSOR Take 1 tablet (50 mg total) by mouth 2 (two) times daily. What changed:  medication strength how much to take   naloxone 4 MG/0.1ML Liqd nasal spray kit Commonly known as: Narcan USE 1 SPRAY ON 1 NOSTRIL ONCE, MAY REPEAT DOSE EVERY 2 TO 3 MINUTES UNTIL REPSONSIVE OR EMS ARRIVE FOR AN OPIOID EMERGENCY.   omeprazole 40 MG capsule Commonly known as: PRILOSEC Take 1 capsule (40 mg total) by mouth daily.   oxyCODONE 5 MG immediate release tablet Commonly known as: Oxy IR/ROXICODONE Take 1 tablet (5 mg total) by mouth every 8 (eight) hours for 5 days.   sildenafil 50 MG tablet Commonly known as: Viagra Take 1 tablet (50 mg total) by mouth daily as needed for erectile dysfunction. At least 24 hours between doses   TechLite Pen Needles 32G X 6 MM Misc Generic drug: Insulin Pen Needle USE AS DIRECTED   valsartan 160 MG tablet Commonly known as: DIOVAN Take 1 tablet (160 mg total) by mouth daily.               Durable Medical Equipment  (From admission, onward)           Start     Ordered   11/14/23 1045  For home use only DME lightweight manual wheelchair with seat cushion  Once       Comments: Patient suffers from weakness which impairs their ability to perform daily activities like bathing in the home.  A walker will not resolve  issue with performing activities of daily living. A wheelchair will allow patient to safely perform daily activities. Patient is not able to propel themselves in the home using a standard weight wheelchair due to general weakness. Patient can self propel in the lightweight wheelchair. Length of need Lifetime. Accessories: elevating leg rests (ELRs), wheel locks, extensions and anti-tippers.   11/14/23 1045              Discharge Care Instructions   (From admission, onward)           Start     Ordered   11/17/23 0000  Discharge wound care:       Comments: : Cleanse L foot wound with Vashe wound cleanser [place] [phone]), do not rinse and allow to air dry.  Pack wound with a piece of silver hydrofiber (Aquacel [place] [phone]) cut to fit wound bed.  Cover with dry gauze and Kerlix roll gauze or silicone foam whichever is preferred by patient.   11/17/23  1021            Follow-up Information     Nadara Mustard, MD Follow up in 2 week(s).   Specialty: Orthopedic Surgery Contact information: [address] [place] [address] [phone]         [facility] Follow up.   Specialty: Rehabilitation Why: they will call you for an appointment Contact information: [address] [place] [address] [phone]               No Known Allergies  Consultations: Orthopedic Dr. [name]   Procedures/Studies: MR FOOT LEFT W WO CONTRAST Result Date: 11/12/2023 CLINICAL DATA:  Soft tissue infection, plantar wound, diabetes EXAM: MRI OF THE LEFT HINDFOOT WITHOUT AND WITH CONTRAST TECHNIQUE: Multiplanar, multisequence MR imaging of the ankle was performed. No intravenous contrast was administered. COMPARISON:  08/02/2018 FINDINGS: TENDONS Peroneal: Distal peroneus brevis indistinct, only very small ossific structure of the base of the fifth metatarsal is still present, the rest of the fifth ray is absent. Posteromedial: Distal tibialis posterior tenosynovitis and mild tendinopathy. Flexor hallucis longus tenosynovitis at the level of the knot of Henry. Anterior: Unremarkable Achilles: Moderate distal Achilles tendinopathy with expansion and increased signal in the tendon for example, image 12 series 3. Plantar Fascia: Mild thickening and accentuated T2 signal in the medial band the plantar fascia proximally favoring mild plantar fasciitis. LIGAMENTS  Lateral: Attenuated anterior talofibular ligament, query remote tear. Medial: Grossly intact. CARTILAGE Ankle Joint: Unremarkable Subtalar Joints/Sinus Tarsi: Unremarkable Bones: Moderate Charcot arthropathy with extensive spurring in the midfoot and Lisfranc joint. No compelling findings of osteomyelitis. Other: 2.5 cm plantar ulceration below the cuboid. This extends towards but does not definitively reach the plantar fascia. Diffuse subcutaneous edema in the ankle and tracking in the forefoot. A component of cellulitis is not excluded. No drainable abscess identified. Low-grade regional intramuscular edema, likely neurogenic. IMPRESSION: 1. 2.5 cm plantar ulceration below the cuboid. This extends towards but does not definitively reach the plantar fascia. No osteomyelitis. 2. Diffuse subcutaneous edema in the ankle and tracking in the forefoot. A component of cellulitis is not excluded. No drainable abscess identified. 3. Moderate Charcot arthropathy with extensive spurring in the midfoot and Lisfranc joint. 4. Moderate distal Achilles tendinopathy. 5. Mild plantar fasciitis. 6. Attenuated anterior talofibular ligament, query remote tear. 7. Low-grade regional intramuscular edema, likely neurogenic. 8. Distal tibialis posterior tenosynovitis and mild tendinopathy. 9. Flexor hallucis longus tenosynovitis at the level of the knot of Henry. 10. Absent fifth ray beyond a small bony fragment of the base of the fifth metatarsal. Electronically Signed   By: Gaylyn Rong M.D.   On: 11/12/2023 08:32   VAS [place] LOWER EXTREMITY VENOUS (DVT) Result Date: 11/11/2023  Lower Venous DVT Study Patient Name:  Taylor Hughes  Date of Exam:   11/11/2023 Medical Rec #: [id number]     Accession #:    [id number] Date of Birth: 1970/11/09      Patient Gender: M Patient Age:   28 years Exam Location:  [facility] Procedure:      VAS [place] LOWER EXTREMITY VENOUS (DVT) Referring Phys: Jonny Ruiz DOUTOVA  --------------------------------------------------------------------------------  Indications: Pain, and Swelling.  Risk Factors: Obesity. Limitations: Body habitus. Comparison Study: No significant changes seen since previous exam 11/22/18. Performing Technologist: Shona Simpson  Examination Guidelines: A complete evaluation includes B-mode imaging, spectral Doppler, color Doppler, and power Doppler as needed of all accessible portions of each vessel. Bilateral testing is  considered an integral part of a complete examination. Limited examinations for reoccurring indications may be performed as noted. The reflux portion of the exam is performed with the patient in reverse Trendelenburg.  +-----+---------------+---------+-----------+----------+--------------+ RIGHTCompressibilityPhasicitySpontaneityPropertiesThrombus Aging +-----+---------------+---------+-----------+----------+--------------+ CFV  Full           Yes      Yes                                 +-----+---------------+---------+-----------+----------+--------------+   +---------+---------------+---------+-----------+----------+-------------------+ LEFT     CompressibilityPhasicitySpontaneityPropertiesThrombus Aging      +---------+---------------+---------+-----------+----------+-------------------+ CFV      Full           Yes      Yes                                      +---------+---------------+---------+-----------+----------+-------------------+ SFJ      Full                                                             +---------+---------------+---------+-----------+----------+-------------------+ FV Prox  Full                                                             +---------+---------------+---------+-----------+----------+-------------------+ FV Mid   Full                                                             +---------+---------------+---------+-----------+----------+-------------------+  FV DistalFull                    Yes                                      +---------+---------------+---------+-----------+----------+-------------------+ PFV      Full                                                             +---------+---------------+---------+-----------+----------+-------------------+ POP      Full           Yes      Yes                                      +---------+---------------+---------+-----------+----------+-------------------+ PTV      Full                    Yes                                      +---------+---------------+---------+-----------+----------+-------------------+  PERO                             Yes                  Not well visualized +---------+---------------+---------+-----------+----------+-------------------+    Summary: RIGHT: - No evidence of common femoral vein obstruction.   LEFT: - There is no evidence of Taylor vein thrombosis in the lower extremity.  - No cystic structure found in the popliteal fossa.  *See table(s) above for measurements and observations. Electronically signed by Coral Else MD on 11/11/2023 at 6:57:35 PM.    Final    ECHOCARDIOGRAM COMPLETE Result Date: 11/11/2023    ECHOCARDIOGRAM REPORT   Patient Name:   UNO ESAU Date of Exam: 11/11/2023 Medical Rec #:  [id number]    Height:       71.0 in Accession #:    [id number]   Weight:       283.5 lb Date of Birth:  20-Dec-1970     BSA:          2.446 m Patient Age:    52 years     BP:           130/70 mmHg Patient Gender: M            HR:           134 bpm. Exam Location:  Inpatient Procedure: 2D Echo, Color Doppler, Cardiac Doppler and Intracardiac            Opacification Agent (Both Spectral and Color Flow Doppler were            utilized during procedure). Indications:    Elevated Troponin  History:        Patient has prior history of Echocardiogram examinations, most                 recent 03/24/2023. Risk Factors:Diabetes, Hypertension and                  Dyslipidemia.  Sonographer:    Harriette Bouillon RDCS Referring Phys: [id number] ANASTASSIA DOUTOVA IMPRESSIONS  1. Left ventricular ejection fraction, by estimation, is 60 to 65%. The left ventricle has normal function. The left ventricle has no regional wall motion abnormalities. There is mild left ventricular hypertrophy. Left ventricular diastolic parameters are indeterminate.  2. Right ventricular systolic function is normal. The right ventricular size is mildly enlarged. Tricuspid regurgitation signal is inadequate for assessing PA pressure.  3. The mitral valve is normal in structure. Trivial mitral valve regurgitation.  4. The aortic valve is tricuspid. Aortic valve regurgitation is not visualized. No aortic stenosis is present.  5. The inferior vena cava is normal in size with greater than 50% respiratory variability, suggesting right atrial pressure of 3 mmHg. FINDINGS  Left Ventricle: Left ventricular ejection fraction, by estimation, is 60 to 65%. The left ventricle has normal function. The left ventricle has no regional wall motion abnormalities. Definity contrast agent was given IV to delineate the left ventricular  endocardial borders. The left ventricular internal cavity size was normal in size. There is mild left ventricular hypertrophy. Left ventricular diastolic parameters are indeterminate. Right Ventricle: The right ventricular size is mildly enlarged. No increase in right ventricular wall thickness. Right ventricular systolic function is normal. Tricuspid regurgitation signal is inadequate for assessing PA pressure. Left Atrium: Left atrial size was normal in size. Right Atrium: Right atrial size was normal in size. Pericardium:  There is no evidence of pericardial effusion. Mitral Valve: The mitral valve is normal in structure. Trivial mitral valve regurgitation. Tricuspid Valve: The tricuspid valve is normal in structure. Tricuspid valve regurgitation is trivial. Aortic Valve: The aortic valve is  tricuspid. Aortic valve regurgitation is not visualized. No aortic stenosis is present. Pulmonic Valve: The pulmonic valve was not well visualized. Pulmonic valve regurgitation is not visualized. Aorta: The aortic root and ascending aorta are structurally normal, with no evidence of dilitation. Venous: The inferior vena cava is normal in size with greater than 50% respiratory variability, suggesting right atrial pressure of 3 mmHg. IAS/Shunts: The interatrial septum was not well visualized.  LEFT VENTRICLE PLAX 2D LVIDd:         4.30 cm LVIDs:         2.80 cm LV PW:         1.30 cm LV IVS:        1.30 cm LVOT diam:     2.20 cm LV SV:         62 LV SV Index:   25 LVOT Area:     3.80 cm  RIGHT VENTRICLE             IVC RV S prime:     17.50 cm/s  IVC diam: 1.90 cm TAPSE (M-mode): 1.8 cm LEFT ATRIUM             Index        RIGHT ATRIUM           Index LA diam:        3.80 cm 1.55 cm/m   RA Area:     18.70 cm LA Vol (A2C):   57.0 ml 23.31 ml/m  RA Volume:   49.30 ml  20.16 ml/m LA Vol (A4C):   72.4 ml 29.60 ml/m LA Biplane Vol: 65.5 ml 26.78 ml/m  AORTIC VALVE LVOT Vmax:   112.00 cm/s LVOT Vmean:  71.400 cm/s LVOT VTI:    0.162 m  AORTA Ao Root diam: 2.60 cm Ao Asc diam:  2.90 cm  SHUNTS Systemic VTI:  0.16 m Systemic Diam: 2.20 cm Epifanio Lesches MD Electronically signed by Epifanio Lesches MD Signature Date/Time: 11/11/2023/12:07:16 PM    Final    CT RENAL STONE STUDY Result Date: 11/10/2023 CLINICAL DATA:  Abdominal/flank pain, stone suspected EXAM: CT ABDOMEN AND PELVIS WITHOUT CONTRAST TECHNIQUE: Multidetector CT imaging of the abdomen and pelvis was performed following the standard protocol without IV contrast. RADIATION DOSE REDUCTION: This exam was performed according to the departmental dose-optimization program which includes automated exposure control, adjustment of the mA and/or kV according to patient size and/or use of iterative reconstruction technique. COMPARISON:  08/04/2006 FINDINGS:  Lower chest: No acute abnormality. Hepatobiliary: No focal hepatic abnormality. Gallbladder unremarkable. Pancreas: No focal abnormality or ductal dilatation. Spleen: No focal abnormality.  Normal size. Adrenals/Urinary Tract: 2.1 cm nodule in the right adrenal gland, not diagnostic of adenoma based on density. Left adrenal gland normal. Mild bilateral perinephric stranding. No ureteral or renal stones. No hydronephrosis. Urinary bladder unremarkable. Stomach/Bowel: Stomach, large and small bowel grossly unremarkable. Vascular/Lymphatic: Aortic atherosclerosis. No evidence of aneurysm or adenopathy. Reproductive: No visible focal abnormality. Other: No free fluid or free air. Musculoskeletal: No acute bony abnormality. IMPRESSION: Bilateral perinephric stranding without stones or hydronephrosis. This could be related to prior insult or acute inflammation. Recommend clinical correlation to completely exclude pyelonephritis. Aortic atherosclerosis. 2.1 cm indeterminate right adrenal nodule. This could be further evaluated with  nonemergent outpatient MRI if felt clinically indicated. Electronically Signed   By: Charlett Nose M.D.   On: 11/10/2023 23:11   DG Chest Portable 1 View Result Date: 11/10/2023 CLINICAL DATA:  DKA.  Shortness of breath. EXAM: PORTABLE CHEST 1 VIEW COMPARISON:  03/25/2023. FINDINGS: Bilateral lung fields are clear. Bilateral costophrenic angles are clear. Note is made of elevated right hemidiaphragm. Stable cardio-mediastinal silhouette. No acute osseous abnormalities. The soft tissues are within normal limits. IMPRESSION: *No active disease. Electronically Signed   By: Jules Schick M.D.   On: 11/10/2023 17:34      Subjective:  No significant events overnight, reports was able to sit for few hours yesterday Discharge Exam: Vitals:   11/17/23 0310 11/17/23 0700  BP: 125/77 (!) 155/91  Pulse: 80   Resp: 18   Temp: 97.8 F (36.6 C) 98.4 F (36.9 C)  SpO2: 100%    Vitals:    11/16/23 2228 11/16/23 2325 11/17/23 0310 11/17/23 0700  BP: (!) 142/84 (!) 141/82 125/77 (!) 155/91  Pulse: 100 96 80   Resp:  20 18   Temp:  98.7 F (37.1 C) 97.8 F (36.6 C) 98.4 F (36.9 C)  TempSrc:  Oral Oral Oral  SpO2:  100% 100%   Weight:      Height:        General: Pt is alert, awake, not in acute distress Cardiovascular: RRR, S1/S2 +, no rubs, no gallops Respiratory: CTA bilaterally, no wheezing, no rhonchi Abdominal: Soft, NT, ND, bowel sounds + Extremities: Lower extremity lymphedema, right foot TMA, left on Prafo boot    The results of significant diagnostics from this hospitalization (including imaging, microbiology, ancillary and laboratory) are listed below for reference.     Microbiology: Recent Results (from the past 240 hours)  Resp panel by RT-PCR (RSV, Flu A&B, Covid) Urine, Clean Catch     Status: None   Collection Time: 11/10/23  3:55 PM   Specimen: Urine, Clean Catch; Nasal Swab  Result Value Ref Range Status   SARS Coronavirus 2 by RT PCR NEGATIVE NEGATIVE Final   Influenza A by PCR NEGATIVE NEGATIVE Final   Influenza B by PCR NEGATIVE NEGATIVE Final    Comment: (NOTE) The Xpert Xpress SARS-CoV-2/FLU/RSV plus assay is intended as an aid in the diagnosis of influenza from Nasopharyngeal swab specimens and should not be used as a sole basis for treatment. Nasal washings and aspirates are unacceptable for Xpert Xpress SARS-CoV-2/FLU/RSV testing.  Fact Sheet for Patients: BloggerCourse.com  Fact Sheet for Healthcare Providers: SeriousBroker.it  This test is not yet approved or cleared by the [place] FDA and has been authorized for detection and/or diagnosis of SARS-CoV-2 by FDA under an Emergency Use Authorization (EUA). This EUA will remain in effect (meaning this test can be used) for the duration of the COVID-19 declaration under Section 564(b)(1) of the Act, 21 U.S.C. section  360bbb-3(b)(1), unless the authorization is terminated or revoked.     Resp Syncytial Virus by PCR NEGATIVE NEGATIVE Final    Comment: (NOTE) Fact Sheet for Patients: BloggerCourse.com  Fact Sheet for Healthcare Providers: SeriousBroker.it  This test is not yet approved or cleared by the [place] FDA and has been authorized for detection and/or diagnosis of SARS-CoV-2 by FDA under an Emergency Use Authorization (EUA). This EUA will remain in effect (meaning this test can be used) for the duration of the COVID-19 declaration under Section 564(b)(1) of the Act, 21 U.S.C. section 360bbb-3(b)(1), unless the authorization is terminated or  revoked.  Performed at [facility], [address], [place], [place] [address]   Urine Culture (for pregnant, neutropenic or urologic patients or patients with an indwelling urinary catheter)     Status: Abnormal   Collection Time: 11/11/23  2:34 AM   Specimen: Urine, Clean Catch  Result Value Ref Range Status   Specimen Description URINE, CLEAN CATCH  Final   Special Requests   Final    NONE Performed at [facility], [address], [place], [place] [address]    Culture MULTIPLE SPECIES PRESENT, SUGGEST RECOLLECTION (A)  Final   Report Status 11/12/2023 FINAL  Final  Culture, blood (Routine X 2) w Reflex to ID Panel     Status: None   Collection Time: 11/11/23  3:08 AM   Specimen: BLOOD LEFT HAND  Result Value Ref Range Status   Specimen Description BLOOD LEFT HAND  Final   Special Requests   Final    BOTTLES DRAWN AEROBIC AND ANAEROBIC Blood Culture adequate volume   Culture   Final    NO GROWTH 5 DAYS Performed at [facility], [address], [place], [place] [address]    Report Status 11/16/2023 FINAL  Final  Culture, blood (Routine X 2) w Reflex to ID Panel     Status: None   Collection Time: 11/11/23  3:19 AM   Specimen: BLOOD LEFT HAND  Result Value Ref  Range Status   Specimen Description BLOOD LEFT HAND  Final   Special Requests   Final    BOTTLES DRAWN AEROBIC AND ANAEROBIC Blood Culture adequate volume   Culture   Final    NO GROWTH 5 DAYS Performed at [facility], [address] [place], [place] [address]    Report Status 11/16/2023 FINAL  Final  MRSA Next Gen by PCR, Nasal     Status: Abnormal   Collection Time: 11/11/23  4:36 AM   Specimen: Nasal Mucosa; Nasal Swab  Result Value Ref Range Status   MRSA by PCR Next Gen DETECTED (A) NOT DETECTED Final    Comment: RESULT CALLED TO, READ BACK BY AND VERIFIED WITH: B WARNER,RN@0610  11/11/23 MK (NOTE) The GeneXpert MRSA Assay (FDA approved for NASAL specimens only), is one component of a comprehensive MRSA colonization surveillance program. It is not intended to diagnose MRSA infection nor to guide or monitor treatment for MRSA infections. Test performance is not FDA approved in patients less than 19 years old. Performed at [facility], [address], [place], [place] [address]      Labs: BNP (last 3 results) Recent Labs    03/29/23 0222 11/10/23 1922 11/13/23 1117  BNP 246.2* 585.6* 242.0*   Basic Metabolic Panel: Recent Labs  Lab 11/11/23 0917 11/11/23 1757 11/12/23 0449 11/12/23 1652 11/13/23 1117 11/14/23 0548 11/15/23 0638 11/16/23 0329  NA  --    < > 135 134* 135 137 137 138  K  --    < > 4.0 4.4 3.3* 3.7 3.4* 3.8  CL  --    < > 103 108 105 108 106 108  CO2  --    < > 15* 19* 19* 18* 19* 20*  GLUCOSE  --    < > 299* 276* 249* 205* 165* 185*  BUN  --    < > 38* 34* 25* 19 16 13   CREATININE  --    < > 1.31* 1.14 1.03 0.93 1.14 0.93  CALCIUM  --    < >  8.5* 8.1* 8.3* 8.1* 8.1* 7.9*  MG 1.8  --  2.0  --  2.0  --   --   --   PHOS <1.0*  --  2.1*  --  2.2* 2.7 2.9 2.8   < > = values in this interval not displayed.   Liver Function Tests: Recent Labs  Lab 11/10/23 1922 11/12/23 0449  AST 15 15  ALT 12 10  ALKPHOS 102 76  BILITOT 1.6*  0.8  PROT 7.0 6.5  ALBUMIN 2.0* 1.8*   No results for input(s): "LIPASE", "AMYLASE" in the last 168 hours. No results for input(s): "AMMONIA" in the last 168 hours. CBC: Recent Labs  Lab 11/10/23 1922 11/11/23 0319 11/12/23 0449 11/13/23 1117 11/14/23 0548 11/15/23 0638 11/16/23 0329  WBC 18.9*   < > 15.0* 12.0* 12.1* 12.1* 9.8  NEUTROABS 17.4*  --   --   --   --   --   --   HGB 11.3*   < > 11.0* 11.0* 10.5* 11.0* 10.1*  HCT 35.6*   < > 34.2* 33.5* 31.9* 34.2* 31.1*  MCV 86.4   < > 84.4 83.3 82.6 84.7 83.6  PLT 314   < > 304 324 333 331 337   < > = values in this interval not displayed.   Cardiac Enzymes: Recent Labs  Lab 11/10/23 1922  CKTOTAL 88   BNP: Invalid input(s): "POCBNP" CBG: Recent Labs  Lab 11/16/23 0932 11/16/23 1235 11/16/23 1612 11/16/23 2106 11/17/23 0853  GLUCAP 230* 213* 209* 140* 128*   D-Dimer No results for input(s): "DDIMER" in the last 72 hours. Hgb A1c No results for input(s): "HGBA1C" in the last 72 hours. Lipid Profile No results for input(s): "CHOL", "HDL", "LDLCALC", "TRIG", "CHOLHDL", "LDLDIRECT" in the last 72 hours. Thyroid function studies No results for input(s): "TSH", "T4TOTAL", "T3FREE", "THYROIDAB" in the last 72 hours.  Invalid input(s): "FREET3" Anemia work up No results for input(s): "VITAMINB12", "FOLATE", "FERRITIN", "TIBC", "IRON", "RETICCTPCT" in the last 72 hours. Urinalysis    Component Value Date/Time   COLORURINE YELLOW 11/11/2023 1852   APPEARANCEUR HAZY (A) 11/11/2023 1852   APPEARANCEUR Clear 06/25/2023 1537   LABSPEC 1.029 11/11/2023 1852   PHURINE 6.0 11/11/2023 1852   GLUCOSEU 50 (A) 11/11/2023 1852   HGBUR SMALL (A) 11/11/2023 1852   BILIRUBINUR NEGATIVE 11/11/2023 1852   BILIRUBINUR Negative 06/25/2023 1537   KETONESUR 20 (A) 11/11/2023 1852   PROTEINUR >=300 (A) 11/11/2023 1852   UROBILINOGEN 0.2 11/14/2021 1146   NITRITE NEGATIVE 11/11/2023 1852   LEUKOCYTESUR NEGATIVE 11/11/2023 1852    Sepsis Labs Recent Labs  Lab 11/13/23 1117 11/14/23 0548 11/15/23 0638 11/16/23 0329  WBC 12.0* 12.1* 12.1* 9.8   Microbiology Recent Results (from the past 240 hours)  Resp panel by RT-PCR (RSV, Flu A&B, Covid) Urine, Clean Catch     Status: None   Collection Time: 11/10/23  3:55 PM   Specimen: Urine, Clean Catch; Nasal Swab  Result Value Ref Range Status   SARS Coronavirus 2 by RT PCR NEGATIVE NEGATIVE Final   Influenza A by PCR NEGATIVE NEGATIVE Final   Influenza B by PCR NEGATIVE NEGATIVE Final    Comment: (NOTE) The Xpert Xpress SARS-CoV-2/FLU/RSV plus assay is intended as an aid in the diagnosis of influenza from Nasopharyngeal swab specimens and should not be used as a sole basis for treatment. Nasal washings and aspirates are unacceptable for Xpert Xpress SARS-CoV-2/FLU/RSV testing.  Fact Sheet for Patients: BloggerCourse.com  Fact Sheet for Healthcare  Providers: SeriousBroker.it  This test is not yet approved or cleared by the [place] and has been authorized for detection and/or diagnosis of SARS-CoV-2 by FDA under an Emergency Use Authorization (EUA). This EUA will remain in effect (meaning this test can be used) for the duration of the COVID-19 declaration under Section 564(b)(1) of the Act, 21 U.S.C. section 360bbb-3(b)(1), unless the authorization is terminated or revoked.     Resp Syncytial Virus by PCR NEGATIVE NEGATIVE Final    Comment: (NOTE) Fact Sheet for Patients: BloggerCourse.com  Fact Sheet for Healthcare Providers: SeriousBroker.it  This test is not yet approved or cleared by the [place] FDA and has been authorized for detection and/or diagnosis of SARS-CoV-2 by FDA under an Emergency Use Authorization (EUA). This EUA will remain in effect (meaning this test can be used) for the duration of the COVID-19 declaration under  Section 564(b)(1) of the Act, 21 U.S.C. section 360bbb-3(b)(1), unless the authorization is terminated or revoked.  Performed at [facility], [address] [place], [place] [address]   Urine Culture (for pregnant, neutropenic or urologic patients or patients with an indwelling urinary catheter)     Status: Abnormal   Collection Time: 11/11/23  2:34 AM   Specimen: Urine, Clean Catch  Result Value Ref Range Status   Specimen Description URINE, CLEAN CATCH  Final   Special Requests   Final    NONE Performed at [facility], [address], [place], [place] [address]    Culture MULTIPLE SPECIES PRESENT, SUGGEST RECOLLECTION (A)  Final   Report Status 11/12/2023 FINAL  Final  Culture, blood (Routine X 2) w Reflex to ID Panel     Status: None   Collection Time: 11/11/23  3:08 AM   Specimen: BLOOD LEFT HAND  Result Value Ref Range Status   Specimen Description BLOOD LEFT HAND  Final   Special Requests   Final    BOTTLES DRAWN AEROBIC AND ANAEROBIC Blood Culture adequate volume   Culture   Final    NO GROWTH 5 DAYS Performed at [facility], [address], [place], [place] [address]    Report Status 11/16/2023 FINAL  Final  Culture, blood (Routine X 2) w Reflex to ID Panel     Status: None   Collection Time: 11/11/23  3:19 AM   Specimen: BLOOD LEFT HAND  Result Value Ref Range Status   Specimen Description BLOOD LEFT HAND  Final   Special Requests   Final    BOTTLES DRAWN AEROBIC AND ANAEROBIC Blood Culture adequate volume   Culture   Final    NO GROWTH 5 DAYS Performed at [facility], [address], [place], [place] [address]    Report Status 11/16/2023 FINAL  Final  MRSA Next Gen by PCR, Nasal     Status: Abnormal   Collection Time: 11/11/23  4:36 AM   Specimen: Nasal Mucosa; Nasal Swab  Result Value Ref Range Status   MRSA by PCR Next Gen DETECTED (A) NOT DETECTED Final    Comment: RESULT CALLED TO, READ BACK BY AND VERIFIED WITH: B  WARNER,RN@0610  11/11/23 MK (NOTE) The GeneXpert MRSA Assay (FDA approved for NASAL specimens only), is one component of a comprehensive MRSA colonization surveillance program. It is not intended to diagnose MRSA infection nor to guide or monitor treatment for MRSA infections. Test performance is not FDA approved in patients less than 65 years old. Performed at [facility], [address] [place], [place] [address]      Time coordinating discharge: Over 30 minutes  SIGNED:   Huey Bienenstock, MD  [facility] 11/17/2023, 10:22 AM Pager   If 7PM-7AM, please contact night-coverage [email or web address] Password [email or web address]

## 2023-11-17 NOTE — Plan of Care (Signed)
  Problem: Clinical Measurements: Goal: Ability to maintain clinical measurements within normal limits will improve Outcome: Progressing Goal: Will remain free from infection Outcome: Progressing   Problem: Activity: Goal: Risk for activity intolerance will decrease Outcome: Progressing   Problem: Coping: Goal: Level of anxiety will decrease Outcome: Progressing   Problem: Pain Managment: Goal: General experience of comfort will improve and/or be controlled Outcome: Progressing   Problem: Skin Integrity: Goal: Risk for impaired skin integrity will decrease Outcome: Progressing

## 2023-11-18 ENCOUNTER — Telehealth: Payer: Self-pay | Admitting: Family Medicine

## 2023-11-18 ENCOUNTER — Other Ambulatory Visit: Payer: Self-pay

## 2023-11-18 ENCOUNTER — Telehealth: Payer: Self-pay

## 2023-11-18 DIAGNOSIS — E119 Type 2 diabetes mellitus without complications: Secondary | ICD-10-CM

## 2023-11-18 DIAGNOSIS — I480 Paroxysmal atrial fibrillation: Secondary | ICD-10-CM

## 2023-11-18 DIAGNOSIS — F331 Major depressive disorder, recurrent, moderate: Secondary | ICD-10-CM

## 2023-11-18 DIAGNOSIS — I482 Chronic atrial fibrillation, unspecified: Secondary | ICD-10-CM

## 2023-11-18 DIAGNOSIS — E1110 Type 2 diabetes mellitus with ketoacidosis without coma: Secondary | ICD-10-CM

## 2023-11-18 DIAGNOSIS — E11628 Type 2 diabetes mellitus with other skin complications: Secondary | ICD-10-CM

## 2023-11-18 DIAGNOSIS — I872 Venous insufficiency (chronic) (peripheral): Secondary | ICD-10-CM

## 2023-11-18 NOTE — Telephone Encounter (Signed)
 Patient has appointment scheduled.

## 2023-11-18 NOTE — Patient Instructions (Addendum)
 Visit Information  Thank you for taking time to visit with me today. Please don't hesitate to contact me if I can be of assistance to you before our next scheduled telephone appointment.  Our next appointment is by telephone on 4/15 at 1:00pm  Following is a copy of your care plan:   Goals Addressed             This Visit's Progress    VBCI Transitions of Care (TOC) Care Plan       Problems:  Recent Hospitalization for treatment of DMII and Diabetic wound  Diet/Nutrition/Food Resources , Equipment/DME new w/c , Functional/Safety concern: fall risk , needs assist transfers, ambulation  Home Health services barrier: none ordered on discharge , No Hospital Follow Up Provider appointment He is scheduling today , and No Specialist appointment Surgeon office to call and schedule  Follow up with Nadara Mustard in 2 weeks Specialty: Orthopedic Surgery [address] [place] [address] [phone] Follow up with [facility] at [facility] Specialty: Rehabilitation they will call you for an appointment [address] [place]  Goal:  Over the next 30 days, the patient will not experience hospital readmission  Interventions:  Transitions of Care:  New goal. Community Resource Referral Made to address Financial Strain Food Insecurity Doctor Visits  - discussed the importance of doctor visits Contacted provider for patient needs Referral for SN PT  Post discharge activity limitations prescribed by provider reviewed Post-op wound/incision care reviewed with patient/caregiver Reviewed Signs and symptoms of infection  Diabetes Interventions:  (Status:  New goal.) Short Term Goal Assessed patient's understanding of A1c goal: <8% Provided education to patient about basic DM disease process Discussed plans with patient for ongoing care management follow up and provided patient with direct contact information for care management  team Reviewed scheduled/upcoming provider appointments including: PCP post hospital and Surgical follow-up  Advised patient, providing education and rationale, to check cbg  and record, calling PCP  for findings outside established parameters Referral made to pharmacy team for assistance with DM management Referral made to social work team for assistance with [employer] and navigating SDOH needs  Assessed social determinant of health barriers Lab Results  Component Value Date   HGBA1C 9.1 (H) 11/11/2023    Patient Self Care Activities:  Attend all scheduled provider appointments Call pharmacy for medication refills 3-7 days in advance of running out of medications Call provider office for new concerns or questions  Participate in Transition of Care Program/Attend TOC scheduled calls Perform all self care activities independently  Perform IADL's (shopping, preparing meals, housekeeping, managing finances) independently Take medications as prescribed   Work with the social worker to address care coordination needs and will continue to work with the clinical team to address health care and disease management related needs Work with the pharmacist to address medication management needs and will continue to work with the clinical team to address health care and disease management related needs check blood sugar at prescribed times: three times daily and when you have symptoms of low or high blood sugar check feet daily for cuts, sores or redness enter blood sugar readings and medication or insulin into daily log take the blood sugar log to all doctor visits trim toenails straight across drink 6 to 8 glasses of water each day limit fast food meals to no more than 1 per week wash and dry feet carefully every day wear comfortable, cotton socks wear comfortable, well-fitting shoes  Plan:  Telephone follow up appointment with care management team member scheduled for:  11/25/23  1:00pm The patient has been provided with contact information for the care management team and has been advised to call with any health related questions or concerns.        Medication review  Reviewed current home medications -- provided education as needed. Patient is aware of potential side effects and was encouraged to notify PCP for any adverse side effects or unwanted symptoms not relieved with interventions    Reviewed goals for care Patient/ Caregiver verbalizes understanding of instructions with the plan of care . The  Patient / Caregiver was encouraged to make informed decisions about care, actively participate in managing health conditions, and implement lifestyle changes as needed to promote independence and self-management of healthcare. SDOH screenings have been completed and addressed if indicted.  There are no reported barriers to care.    Follow-up Plan 1.If not already completed- Please call scheduled a post hospital Follow up appointment  with your  PCP within in 1-2 weeks of your hospital discharge date -when scheduling please ask your Primary MD to get all Hospital records sent to his/her office. Take all your medications and your discharge paperwork with you for your next visit with your Primary MD-   Be sure to request your Primary MD to go over all hospital tests and procedure/radiological results at the follow up appointment ,   VBCI Case Management Nurse will provide follow-up and on-going assessment ,evaluation and education of disease processes, recommended interventions for both chronic and acute medical conditions ,  along with ongoing review of symptoms ,medication reviews / reconciliation during each weekly call . Any updates , inconsistencies, discrepancies or acute care concerns will be addressed and routed to the correct Practitioner if indicated     [facility]  Please call the care guide team at [phone]  if you need to cancel or  reschedule your appointment . For scheduled calls -Three attempts will be made to reach you -if the scheduled call is missed or  we are unable to reach the you after 3 attempts no additional outreach attempts will be made and the TOC follow-up will be closed .   If you need to speak to a Nurse you may  call me directly at the number below or if I am unavailable,and  your need is urgent  please call the main VBCI number at [phone] and ask to speak with one of the [facility] ( Transition of Care )  Nurses  .  Patient was encouraged to Contact PCP with any changes in baseline or  medication regimen,  changes in health status  /  well-being, safety concerns, including falls any questions or concerns regarding ongoing medical care, any difficulty obtaining or picking up prescriptions, any changes or worsening in condition- including  symptoms not relieved  with interventions                                                                            Additionally, If you experience worsening of your symptoms, develop shortness of breath, If you are experiencing a medical emergency,  develop suicidal or homicidal thoughts you must seek  medical attention immediately by calling 911 or report to your local emergency department or urgent care.   If you have a non-emergency medical problem during routine business hours, please contact your provider's office and ask to speak with a nurse.       Please take the time to read instructions/literature along with the possible adverse reactions/side effects for all the Medicines that have been prescribed to you. Only take newly prescribed  Medications after you have completely understood and accept all the possible adverse reactions/side effects.   Do not take more than prescribed Medications for  Pain, Sleep and Anxiety. Do not drive when taking Pain medications or sleep aid/ insomnia  medications It is not advisable to combine anxiety, sleep and pain medications without  talking with your primary care practitioner    If you are experiencing a Mental Health or Behavioral Health Crisis or need someone to talk to Please call the Suicide and Crisis Lifeline: 988 You may also call the [place] National Suicide Prevention Lifeline: 916 639 3116 or TTY: 218-285-9560 TTY (951)882-2245) to talk to a trained counselor.  You may call the Behavioral Health Crisis Line at 813 307 5208, at any time, 24 hours a day, 7 days a week- however If you are in danger or need immediate medical attention, call 911.   If you would like help to quit smoking, call 1-800-QUIT-NOW ( 760-588-1348) OR Espaol: 1-855-Djelo-Ya (4-742-595-6387) o para ms informacin haga clic aqu or Text READY to 564-332 to register via text.   The patient has been provided with contact information for the care management team and has been advised to call with any health-related questions or concerns. Follow up call  with Care Team  as scheduled,or sooner should any new problems arise.  Susa Loffler , BSN, RN [place]   [facility]Population Health RN Care Manager Direct Dial [phone]  Website: [email or web address]

## 2023-11-18 NOTE — Telephone Encounter (Addendum)
 FYI  Copied from CRM [id number]. Topic: General - Other >> Nov 18, 2023  2:23 PM Lars Mage H wrote: Reason for CRM: Case Manager form [place] is calling to provide a heads up to the clinical team - this patient was discharged from the hospital yesterday, they were not able to secure home health prior to discharge - the patient has a diabetic wound on his foot that needs daily dressing changes - he also had some difficulty walking and left with a wheelchair - they recommend skilled nursing and physical therapy for him. Patient will be calling for an appointment today and Case Management is asking for our help to ensure that we can secure a referral and home health services to be provided for the patient. A call back is not necessary, however, follow up with the patient would be appreciated. They will be calling the patient next week, Patient stated that they would call to schedule an appointment.

## 2023-11-18 NOTE — Transitions of Care (Post Inpatient/ED Visit) (Signed)
 11/18/2023  Name: Taylor Hughes MRN: [id number] DOB: [birth year]  Today's TOC FU Call Status: Today's TOC FU Call Status:: Successful TOC FU Call Completed TOC FU Call Complete Date: 11/18/23 Patient's Name and Date of Birth confirmed.  Transition Care Management Follow-up Telephone Call Date of Discharge: 11/17/23 Discharge Facility: [facility]) Type of Discharge: Inpatient Admission Primary Inpatient Discharge Diagnosis:: Sepsis, POA  Diabetic foot wound section.  -Source of sepsis most likely related to left foot diabetic wound, How have you been since you were released from the hospital?: Better Any questions or concerns?: Yes Patient Questions/Concerns:: Home health referral Patient Questions/Concerns Addressed: Notified Provider of Patient Questions/Concerns  Items Reviewed:   Did you receive and understand the discharge instructions provided?: Yes Medications obtained,verified, and reconciled?: Yes (Medications Reviewed) Any new allergies since your discharge?: No Dietary orders reviewed?: Yes Type of Diet Ordered:: Diet: Heart Healthy /carb modified, Do you have support at home?: Yes People in Home [RPT]: significant other Name of Support/Comfort Primary Source: Taylor Hughes Discharge instructions Follow with Primary MD Hoy Register, MD in 7 days Call placed to PCP to request referral for Home Health SN and Therapy  when F2F  post hospital visit is complete Get CBC, CMP, checked by Primary MD next visit.  [facility] Out[facility] at [facility] Specialty: Rehabilitation they will call you for an appointment [address] [place] [address] [phone] Follow up with Nadara Mustard in 2 weeks Specialty: Orthopedic Surgery [address] [place] [address] [phone]   Activity: As tolerated with Full fall precautions use walker/cane & assistance as needed Disposition Home Diet: Heart Healthy /carb  modified, On your next visit with your primary care physician please Get Medicines reviewed and adjusted. Medications Reviewed Today: Medications Reviewed Today     Reviewed by Johnnette Barrios, RN (Registered Nurse) on 11/18/23 at 1423  Med List Status: <None>   Medication Order Taking? Sig Documenting Provider Last Dose Status Informant  Accu-Chek Softclix Lancets lancets [id number] Yes Use to check blood sugar 3 times daily. Hoy Register, MD Taking Active Self, Pharmacy Records  atorvastatin (LIPITOR) 20 MG tablet [id number] Yes Take 1 tablet (20 mg total) by mouth daily. Hoy Register, MD Taking Active Self, Pharmacy Records  Blood Glucose Monitoring Suppl (ACCU-CHEK GUIDE) w/Device KIT [id number] Yes Use to check blood sugar 3 times daily. Hoy Register, MD Taking Active Self, Pharmacy Records  doxycycline (VIBRA-TABS) 100 MG tablet [id number] Yes Take 1 tablet (100 mg total) by mouth every 12 (twelve) hours for 3 days. Elgergawy, Leana Roe, MD Taking Active    Patient not taking:   Discontinued 11/04/23 1041 (Patient Preference)            Med Note>> Governor Specking, CPhT   11/04/2023 10:41 AM Pt states he is no longer taking    gabapentin (NEURONTIN) 300 MG capsule [id number] Yes Take 1 capsule (300 mg total) by mouth 2 (two) times daily. Hoy Register, MD Taking Active Self, Pharmacy Records  glucose blood (ACCU-CHEK GUIDE TEST) test strip [id number] Yes use as directed 3 (three) times daily. Hoy Register, MD Taking Active Self, Pharmacy Records  glucose blood (ACCU-CHEK GUIDE) test strip [id number] Yes Use to check blood sugar 3 times daily. Hoy Register, MD Taking Active Self, Pharmacy Records  insulin glargine (LANTUS SOLOSTAR) 100 UNIT/ML Solostar Pen [id number] Yes Inject 27 Units into the skin daily. Elgergawy, Leana Roe, MD Taking Active   insulin lispro (HUMALOG KWIKPEN) 100 UNIT/ML KwikPen  [id number] Yes Inject 0-12 units into the skin three times daily with meals. Per  sliding scale. Hoy Register, MD Taking Active Self, Pharmacy Records  Insulin Pen Needle 32G X 6 MM MISC [id number] Yes USE AS DIRECTED Hoy Register, MD Taking Active Self, Pharmacy Records  Menthol, Topical Analgesic, (ICY HOT BACK) 5 % PTCH [id number] Yes Apply 1 patch topically daily. [provider] Taking Active Self, Pharmacy Records  metoprolol tartrate (LOPRESSOR) 50 MG tablet [id number] Yes Take 1 tablet (50 mg total) by mouth 2 (two) times daily. Elgergawy, Leana Roe, MD Taking Active   naloxone [facility]) nasal spray 4 mg/0.1 mL [id number] Yes USE 1 SPRAY ON 1 NOSTRIL ONCE, MAY REPEAT DOSE EVERY 2 TO 3 MINUTES UNTIL REPSONSIVE OR EMS ARRIVE FOR AN OPIOID EMERGENCY.  Taking Active   omeprazole (PRILOSEC) 40 MG capsule [id number] Yes Take 1 capsule (40 mg total) by mouth daily. Hoy Register, MD Taking Active Self, Pharmacy Records  oxyCODONE (OXY IR/ROXICODONE) 5 MG immediate release tablet [id number] Yes Take 1 tablet (5 mg total) by mouth every 8 (eight) hours for 5 days. Elgergawy, Leana Roe, MD Taking Active   sildenafil (VIAGRA) 50 MG tablet [id number] Yes Take 1 tablet (50 mg total) by mouth daily as needed for erectile dysfunction. At least 24 hours between doses Hoy Register, MD Taking Active Self, Pharmacy Records  valsartan (DIOVAN) 160 MG tablet [id number] Yes Take 1 tablet (160 mg total) by mouth daily. Hoy Register, MD Taking Active Self, Pharmacy Records          Medication reconciliation / review completed based on most recent discharge summary and EHR medication list. Confirmed patient is taking all newly prescribed medications as instructed (any discrepancies are noted in review section)   Patient / Caregiver is aware of any changes to and / or  any dosage adjustments to medication regimen. Patient/ Caregiver denies questions at this time and reports no barriers to medication adherence.   Reviewed the following updates with patient  START taking: doxycycline  (VIBRA-TABS) naloxone (Narcan) CHANGE how you take: Lantus SoloStar (insulin glargine) metoprolol tartrate (LOPRESSOR)  Discharge wound care: : Cleanse L foot wound with Vashe wound cleanser do not rinse and allow to air dry. Pack wound with a piece of silver hydrofiber -Aquacel AG  cut to fit wound bed. Cover with dry gauze and Kerlix roll gauze or silicone foam whichever is preferred by patient.  Home Care and Equipment/Supplies: Were Home Health Services Ordered?: No (Call placed to PCP to request [facility] referral for SN and PT) Any new equipment or medical supplies ordered?: Yes Name of Medical supply agency?: Hospital provided Lightweight manual wheelchair with seat cushion Were you able to get the equipment/medical supplies?: Yes Do you have any questions related to the use of the equipment/supplies?: No  Functional Questionnaire: Do you need assistance with bathing/showering or dressing?: No Do you need assistance with meal preparation?: No Do you need assistance with eating?: No Do you have difficulty maintaining continence: No Do you need assistance with getting out of bed/getting out of a chair/moving?: Yes Do you have difficulty managing or taking your medications?: No  Follow up appointments reviewed: PCP Follow-up appointment confirmed?: No (He was calling to schedule) MD Provider Line [phone] Given: No Specialist Hospital Follow-up appointment confirmed?: No Reason Specialist Follow-Up Not Confirmed: Patient has Specialist Provider Number and will Call for Appointment Do you need transportation to your follow-up appointment?: No Do you understand care options if your condition(s) worsen?: Yes-patient verbalized  understanding  SDOH Interventions Today    Flowsheet Row Most Recent Value  SDOH Interventions   Food Insecurity Interventions Community Resources Provided, Other (Comment)  [He has meals set up thru Insurance]  Housing Interventions  Intervention Not Indicated  Transportation Interventions Intervention Not Indicated, AMB Referral, Payor Benefit, Patient Resources (Friends/Family)  Utilities Interventions Intervention Not Indicated       Goals Addressed             This Visit's Progress    VBCI Transitions of Care (TOC) Care Plan       Problems:  Recent Hospitalization for treatment of DMII and Diabetic wound  Diet/Nutrition/Food Resources , Equipment/DME new w/c , Functional/Safety concern: fall risk , needs assist transfers, ambulation  Home Health services barrier: none ordered on discharge , No Hospital Follow Up Provider appointment He is scheduling today , and No Specialist appointment Surgeon office to call and schedule  Follow up with Nadara Mustard in 2 weeks Specialty: Orthopedic Surgery [address] [place] [address] [phone] Follow [facility] with [facility] Outpatient Orthopedic Rehabilitation at [facility] Specialty: Rehabilitation they will call you for an appointment [address] [place] Ambler  Goal:  Over the next 30 days, the patient will not experience hospital readmission  Interventions:  Transitions of Care:  New goal. Community Resource Referral Made to address Financial Strain Food Insecurity Doctor Visits  - discussed the importance of doctor visits Contacted provider for patient needs Referral for SN PT  Post discharge activity limitations prescribed by provider reviewed Post-op wound/incision care reviewed with patient/caregiver Reviewed Signs and symptoms of infection  Diabetes Interventions:  (Status:  New goal.) Short Term Goal Assessed patient's understanding of A1c goal: <8% Provided education to patient about basic DM disease process Discussed plans with patient for ongoing care management follow up and provided patient with direct contact information for care management team Reviewed scheduled/upcoming provider appointments including: PCP post hospital  and Surgical follow-up  Advised patient, providing education and rationale, to check cbg  and record, calling PCP  for findings outside established parameters Referral made to pharmacy team for assistance with DM management Referral made to social work team for assistance with [employer] and navigating SDOH needs  Assessed social determinant of health barriers Lab Results  Component Value Date   HGBA1C 9.1 (H) 11/11/2023    Patient Self Care Activities:  Attend all scheduled provider appointments Call pharmacy for medication refills 3-7 days in advance of running out of medications Call provider office for new concerns or questions  Participate in Transition of Care Program/Attend TOC scheduled calls Perform all self care activities independently  Perform IADL's (shopping, preparing meals, housekeeping, managing finances) independently Take medications as prescribed   Work with the social worker to address care coordination needs and will continue to work with the clinical team to address health care and disease management related needs Work with the pharmacist to address medication management needs and will continue to work with the clinical team to address health care and disease management related needs check blood sugar at prescribed times: three times daily and when you have symptoms of low or high blood sugar check feet daily for cuts, sores or redness enter blood sugar readings and medication or insulin into daily log take the blood sugar log to all doctor visits trim toenails straight across drink 6 to 8 glasses of water each day limit fast food meals to no more than 1 per week wash and dry feet  carefully every day wear comfortable, cotton socks wear comfortable, well-fitting shoes  Plan:  Telephone follow up appointment with care management team member scheduled for:  11/25/23 1:00pm The patient has been provided with contact information for the care management team  and has been advised to call with any health related questions or concerns.          Patient is at high risk for readmission and/or has history of  high utilization  Discussed VBCI  TOC program and weekly calls to patient to assess condition/status, medication management  and provide support/education as indicated . Patient/ Caregiver voiced understanding and is  agreeable to 30 day program    Consent to share information   The Patient  / Caregiver is aware and gave permission for VBCI CM  to share personal information  including TOC engagement notes with other service providers in connection with care, including accessing and sharing medical, and if applicable, mental health records. The Patient / Caregiver also agreed to a referral ( if indicated) being requested in order to support their identified needs.     Ambulatory Referral  for identified SDOH needs. Please see additional details in the order section of this note  Patient / Caregiver was made aware   The Complex Care Management services include support from the care team which includes  a  Clinical Social Worker, and /  or Pharmacist. To assist with medication needs and concerns   The Complex Care Management team's purpose is to assist in removing   barriers to your health care and to assist  you  in achieving  the goals  that are most important to your wellbeing  Complex Care Management services are voluntary, and the patient / caregiver may decline or stop services at any time by request to their care team member.   Consent to share information   The Patient  / Caregiver is aware and gave permission for VBCI CM  to share personal information  including TOC engagement notes with other service providers in connection with care, including accessing and sharing medical, and if applicable, mental health records. The Patient / Caregiver also agreed to a referral ( if indicated) being requested in order to support their identified  needs.    Susa Loffler , BSN, RN [facility]   VBCI-Population Health RN Care Manager Direct Dial [phone]  Fax: [phone] Website: [email or web address]

## 2023-11-19 ENCOUNTER — Telehealth: Payer: Self-pay | Admitting: *Deleted

## 2023-11-19 NOTE — Progress Notes (Signed)
 Complex Care Management Note  Care Guide Note 11/19/2023 Name: Taylor Hughes MRN: [id number] DOB: 25-Jun-1971  [name] is a 53 y.o. year old male who sees Hoy Register, MD for primary care. I reached out to [name] by phone today to offer complex care management services.  [name] was given information about Complex Care Management services today including:   The Complex Care Management services include support from the care team which includes your Nurse Care Manager, Clinical Social Worker, or Pharmacist.  The Complex Care Management team is here to help remove barriers to the health concerns and goals most important to you. Complex Care Management services are voluntary, and the patient may decline or stop services at any time by request to their care team member.   Complex Care Management Consent Status: Patient agreed to services and verbal consent obtained.   Follow up plan:  Telephone appointment with complex care management team member scheduled for:  4/21  Encounter Outcome:  Patient Scheduled  Gwenevere Ghazi  [facility]  [facility] Guide  Direct Dial: [phone]  Fax [phone]

## 2023-11-21 DIAGNOSIS — Z419 Encounter for procedure for purposes other than remedying health state, unspecified: Secondary | ICD-10-CM | POA: Diagnosis not present

## 2023-11-23 ENCOUNTER — Other Ambulatory Visit: Payer: Self-pay

## 2023-11-24 ENCOUNTER — Other Ambulatory Visit: Payer: Self-pay

## 2023-11-24 ENCOUNTER — Other Ambulatory Visit: Payer: Self-pay | Admitting: Family Medicine

## 2023-11-24 DIAGNOSIS — N528 Other male erectile dysfunction: Secondary | ICD-10-CM

## 2023-11-24 DIAGNOSIS — Z794 Long term (current) use of insulin: Secondary | ICD-10-CM

## 2023-11-25 ENCOUNTER — Other Ambulatory Visit: Payer: Self-pay

## 2023-11-25 ENCOUNTER — Other Ambulatory Visit (HOSPITAL_BASED_OUTPATIENT_CLINIC_OR_DEPARTMENT_OTHER): Payer: Self-pay

## 2023-11-25 MED ORDER — ATORVASTATIN CALCIUM 20 MG PO TABS
20.0000 mg | ORAL_TABLET | Freq: Every day | ORAL | 0 refills | Status: DC
  Filled 2023-11-25: qty 90, 90d supply, fill #0

## 2023-11-25 MED ORDER — SILDENAFIL CITRATE 50 MG PO TABS
50.0000 mg | ORAL_TABLET | Freq: Every day | ORAL | 0 refills | Status: DC | PRN
  Filled 2023-11-25: qty 90, 90d supply, fill #0

## 2023-11-25 MED ORDER — ICY HOT BACK 5 % EX PTCH
1.0000 | MEDICATED_PATCH | CUTANEOUS | 0 refills | Status: DC
  Filled 2023-11-25: qty 14, fill #0

## 2023-11-25 MED ORDER — VALSARTAN 160 MG PO TABS
160.0000 mg | ORAL_TABLET | Freq: Every day | ORAL | 0 refills | Status: DC
  Filled 2023-11-25: qty 90, 90d supply, fill #0

## 2023-11-25 NOTE — Transitions of Care (Post Inpatient/ED Visit) (Signed)
 Care Management  Transitions of Care Program Transitions of Care Post-discharge week 2  11/25/2023 Name: Taylor Hughes MRN: [id number] DOB: 09-Sep-1970  Subjective: Taylor Hughes is a 53 y.o. year old male who is a primary care patient of Newlin, Enobong, MD. The Care Management team was unable to reach the patient by phone to assess and address transitions of care needs.  Patient  Outreach attempt is in the course of  VBCI  30-day TOC program. Pt previously agreed and is enrolled in the  program due to potential risk for readmission and/or high utilization. Unfortunately, I was not able to speak with the patient in regards to recent hospital discharge    Patient's voicemail has  a generic greeting. To maintain HIPAA compliance, left message including only VBCI CM contact information and a request for a call back .   Plan: Additional outreach attempts will be made to reach the patient enrolled in the [facility] Program (Post Inpatient/ED Visit).  James Mcardle , BSN, RN [facility]   VBCI-Population Health RN Care Manager Direct Dial [phone]  Fax: [phone] Website: [email or web address]

## 2023-11-25 NOTE — Telephone Encounter (Signed)
 Requested Prescriptions  Pending Prescriptions Disp Refills   atorvastatin (LIPITOR) 20 MG tablet 90 tablet 0    Sig: Take 1 tablet (20 mg total) by mouth daily.     Cardiovascular:  Antilipid - Statins Failed - 11/25/2023  9:30 AM      Failed - Lipid Panel in normal range within the last 12 months    Cholesterol, Total  Date Value Ref Range Status  05/09/2022 173 100 - 199 mg/dL Final   Cholesterol  Date Value Ref Range Status  11/11/2023 153 0 - 200 mg/dL Final   LDL Chol Calc (NIH)  Date Value Ref Range Status  05/09/2022 92 0 - 99 mg/dL Final   LDL Cholesterol  Date Value Ref Range Status  11/11/2023 81 0 - 99 mg/dL Final    Comment:           Total Cholesterol/HDL:CHD Risk Coronary Heart Disease Risk Table                     Men   Women  1/2 Average Risk   3.4   3.3  Average Risk       5.0   4.4  2 X Average Risk   9.6   7.1  3 X Average Risk  23.4   11.0        Use the calculated Patient Ratio above and the CHD Risk Table to determine the patient's CHD Risk.        ATP III CLASSIFICATION (LDL):  <100     mg/dL   Optimal  161-096  mg/dL   Near or Above                    Optimal  130-159  mg/dL   Borderline  045-409  mg/dL   High  >811     mg/dL   Very High Performed at [facility], [address] [place], [place] [address]    HDL  Date Value Ref Range Status  11/11/2023 60 >40 mg/dL Final  29/56/2130 71 >86 mg/dL Final   Triglycerides  Date Value Ref Range Status  11/11/2023 61 <150 mg/dL Final         Passed - Patient is not pregnant      Passed - Valid encounter within last 12 months    Recent Outpatient Visits           5 months ago Acute cystitis with hematuria   [facility] - [facility] Dept Of [place]. [facility] Storm Frisk, MD   6 months ago Hypertension associated with diabetes [facility])   [place] [facility] - A Dept Of [place]. [facility] Hoy Register, MD   7 months ago  Type 2 diabetes mellitus with diabetic polyneuropathy, with long-term current use of insulin (HCC)   [facility] [place] - A Dept Of [place]. [facility] Hoy Register, MD   10 months ago Type 2 diabetes mellitus with diabetic polyneuropathy, with long-term current use of insulin (HCC)   [facility] [facility] [place] - A Dept Of [place]. [facility] Hoy Register, MD   1 year ago Essential hypertension   [place] [facility] [place] - A Dept Of [place]. [facility] Drucilla Chalet, RPH-CPP       Future Appointments             In  1 week Jonelle Neri Genna Khan [facility] [facility] [place] - A Dept Of [place]. [facility]             valsartan (DIOVAN) 160 MG tablet 90 tablet 0    Sig: Take 1 tablet (160 mg total) by mouth daily.     Cardiovascular:  Angiotensin Receptor Blockers Failed - 11/25/2023  9:30 AM      Failed - Last BP in normal range    BP Readings from Last 1 Encounters:  11/17/23 (!) 155/91         Passed - Cr in normal range and within 180 days    Creatinine, Ser  Date Value Ref Range Status  11/16/2023 0.93 0.61 - 1.24 mg/dL Final   Creatinine, Urine  Date Value Ref Range Status  11/11/2023 167 mg/dL Final    Comment:    Performed at [facility], [address] [place], [place] [address]         Passed - K in normal range and within 180 days    Potassium  Date Value Ref Range Status  11/16/2023 3.8 3.5 - 5.1 mmol/L Final         Passed - Patient is not pregnant      Passed - Valid encounter within last 6 months    Recent Outpatient Visits           5 months ago Acute cystitis with hematuria   [place] [facility] Dept Of [place]. [facility] Vernell Goldsmith, MD   6 months ago Hypertension associated with diabetes [facility])   [facility] [facility] - A Dept Of [place]. [facility] Joaquin Mulberry, MD   7 months ago Type 2  diabetes mellitus with diabetic polyneuropathy, with long-term current use of insulin (HCC)   [facility] [place] - A Dept Of [place]. [facility] Joaquin Mulberry, MD   10 months ago Type 2 diabetes mellitus with diabetic polyneuropathy, with long-term current use of insulin (HCC)   [facility] [facility] [place] - A Dept Of [place]. [facility] Joaquin Mulberry, MD   1 year ago Essential hypertension   [place] [facility] [place] - A Dept Of [place]. [facility] Valente Gaskin, RPH-CPP       Future Appointments             In 1 week Jonelle Neri, Stan Eans, PA-C [place] [facility] - A Dept Of [place]. [facility]             sildenafil (VIAGRA) 50 MG tablet 90 tablet 0    Sig: Take 1 tablet (50 mg total) by mouth daily as needed for erectile dysfunction. At least 24 hours between doses     Urology: Erectile Dysfunction Agents Failed - 11/25/2023  9:30 AM      Failed - Last BP in normal range    BP Readings from Last 1 Encounters:  11/17/23 (!) 155/91         Passed - AST in normal range and within 360 days    AST  Date Value Ref Range Status  11/12/2023 15 15 - 41 U/L Final         Passed - ALT in normal range and within 360 days    ALT  Date Value Ref Range Status  11/12/2023 10 0 - 44 U/L  Final         Passed - Valid encounter within last 12 months    Recent Outpatient Visits           5 months ago Acute cystitis with hematuria   [facility] [facility] - A Dept Of [place]. [facility] Vernell Goldsmith, MD   6 months ago Hypertension associated with diabetes [facility])   [place] [facility] - A Dept Of [place]. [facility] Joaquin Mulberry, MD   7 months ago Type 2 diabetes mellitus with diabetic polyneuropathy, with long-term current use of insulin (HCC)   [place] [facility] [place] - A Dept Of [place]. [facility] Joaquin Mulberry, MD    10 months ago Type 2 diabetes mellitus with diabetic polyneuropathy, with long-term current use of insulin (HCC)   [place] [facility] [place] - A Dept Of [place]. [facility] Joaquin Mulberry, MD   1 year ago Essential hypertension   [place] [facility] [place] - A Dept Of [place]. [facility] Valente Gaskin, RPH-CPP       Future Appointments             In 1 week Jonelle Neri, Stan Eans, PA-C [place] [facility] - A Dept Of [place]. [facility]

## 2023-11-26 ENCOUNTER — Telehealth: Payer: Self-pay

## 2023-11-26 DIAGNOSIS — R69 Illness, unspecified: Secondary | ICD-10-CM | POA: Diagnosis not present

## 2023-11-26 NOTE — Transitions of Care (Post Inpatient/ED Visit) (Signed)
 Transition of Care week 2  Visit Note  11/26/2023  Name: Taylor Hughes MRN: [id number]          DOB: [birth year]  Situation: Patient enrolled in [facility] 30-day program. Visit completed with patient  by telephone.   Background:   Initial Transition Care Management Follow-up Telephone Call    Past Medical History:  Diagnosis Date   Atrial fibrillation (HCC)    Diabetes mellitus without complication (HCC)    Type 2   GERD (gastroesophageal reflux disease)    Hyperlipidemia    Hypertension    Morbid (severe) obesity due to excess calories (HCC) 08/07/2023   bmi 44.63   Wears glasses    Wound, open, foot    left diabetic     Assessment: Patient Reported Symptoms: Cognitive Cognitive Status: Insightful and able to interpret abstract concepts, Alert and oriented to person, place, and time, Normal speech and language skills   Health Maintenance Behaviors: Annual physical exam, Exercise, Immunizations, Sleep adequate Healing Pattern: Average Health Facilitated by: Healthy diet, Pain control, Stress management  Neurological   Neurological Management Strategies: Adequate rest, Coping strategies, Medication therapy Neurological Self-Management Outcome: 4 (good)  HEENT HEENT Symptoms Reported: No symptoms reported      Cardiovascular Cardiovascular Symptoms Reported: Fainting, Swelling in legs or feet Does patient have uncontrolled Hypertension?: Yes Is patient checking Blood Pressure at home?: Yes Patient's Recent BP reading at home: did not check yet Cardiovascular Conditions: High blood cholesterol, Hypertension Cardiovascular Management Strategies: Adequate rest, Coping strategies, Exercise, Medication therapy Weight: (!) 302 lb (137 kg) Cardiovascular Self-Management Outcome: 3 (uncertain)  Respiratory Respiratory Symptoms Reported: No symptoms reported    Endocrine Patient reports the following symptoms related to hypoglycemia or hyperglycemia : Weakness or fatigue Is patient  diabetic?: Yes Is patient checking blood sugars at home?: Yes Endocrine Conditions: Diabetes Endocrine Management Strategies: Adequate rest, Coping strategies, Diet modification, Exercise, Medication therapy Endocrine Self-Management Outcome: 2 (bad) Endocrine Comment: elevated A1C  9.2  Gastrointestinal Gastrointestinal Symptoms Reported: No symptoms reported      Genitourinary      Integumentary Integumentary Symptoms Reported: Wound Additional Integumentary Details: left foot diabetic wound Skin Conditions: Wound Skin Management Strategies: Adequate rest, Coping strategies, Diet modification, Dressing changes, Exercise, Weight management, Medication therapy, Medical device Skin Self-Management Outcome: 3 (uncertain) Skin Comment: has lymphedema pumps uses daily will increase to 3 x day  Musculoskeletal Musculoskelatal Symptoms Reviewed: Difficulty walking, Muscle pain, Unsteady gait, Weakness Musculoskeletal Conditions: Joint pain, Back pain, Mobility limited Musculoskeletal Management Strategies: Adequate rest, Coping strategies, Diet modification, Exercise, Weight management, Medication therapy, Medical device Musculoskeletal Self-Management Outcome: 3 (uncertain) Falls in the past year?: No Number of falls in past year: 1 or less Patient at Risk for Falls Due to: Impaired balance/gait, Impaired mobility  Psychosocial Psychosocial Symptoms Reported: No symptoms reported         There were no vitals filed for this visit.  Medications Reviewed Today     Reviewed by Grafton Lawrence, RN (Registered Nurse) on 11/26/23 at [phone]  Med List Status: <None>   Medication Order Taking? Sig Documenting Provider Last Dose Status Informant  Accu-Chek Softclix Lancets lancets [id number] Yes Use to check blood sugar 3 times daily. Newlin, Enobong, MD Taking Active Self, Pharmacy Records  atorvastatin (LIPITOR) 20 MG tablet [id number] Yes Take 1 tablet (20 mg total) by mouth daily. Newlin,  Enobong, MD Taking Active   Blood Glucose Monitoring Suppl (ACCU-CHEK GUIDE) w/Device KIT [id number] Yes Use to check blood sugar 3  times daily. Newlin, Enobong, MD Taking Active Self, Pharmacy Records   Patient not taking:   Discontinued 11/04/23 1041 (Patient Preference)            Med Note>> Tommie Frame, CPhT   11/04/2023 10:41 AM Pt states he is no longer taking    gabapentin (NEURONTIN) 300 MG capsule [id number] Yes Take 1 capsule (300 mg total) by mouth 2 (two) times daily. Newlin, Enobong, MD Taking Active Self, Pharmacy Records  glucose blood (ACCU-CHEK GUIDE TEST) test strip [id number] Yes use as directed 3 (three) times daily. Newlin, Enobong, MD Taking Active Self, Pharmacy Records  glucose blood (ACCU-CHEK GUIDE) test strip [id number] Yes Use to check blood sugar 3 times daily. Newlin, Enobong, MD Taking Active Self, Pharmacy Records  insulin glargine (LANTUS SOLOSTAR) 100 UNIT/ML Solostar Pen [id number] Yes Inject 27 Units into the skin daily. Elgergawy, Ardia Kraft, MD Taking Active   insulin lispro (HUMALOG KWIKPEN) 100 UNIT/ML KwikPen [id number] Yes Inject 0-12 units into the skin three times daily with meals. Per sliding scale. Newlin, Enobong, MD Taking Active Self, Pharmacy Records  Insulin Pen Needle 32G X 6 MM MISC [id number] Yes USE AS DIRECTED Joaquin Mulberry, MD Taking Active Self, Pharmacy Records  Menthol, Topical Analgesic, (ICY HOT BACK) 5 % PTCH [id number] Yes Apply 1 patch topically daily. Newlin, Enobong, MD Taking Active   metoprolol tartrate (LOPRESSOR) 50 MG tablet [id number] Yes Take 1 tablet (50 mg total) by mouth 2 (two) times daily. Elgergawy, Ardia Kraft, MD Taking Active   naloxone [facility]) nasal spray 4 mg/0.1 mL [id number] Yes USE 1 SPRAY ON 1 NOSTRIL ONCE, MAY REPEAT DOSE EVERY 2 TO 3 MINUTES UNTIL REPSONSIVE OR EMS ARRIVE FOR AN OPIOID EMERGENCY.  Taking Active   omeprazole (PRILOSEC) 40 MG capsule [id number] Yes Take 1 capsule (40 mg total) by mouth daily. Newlin,  Enobong, MD Taking Active Self, Pharmacy Records  sildenafil (VIAGRA) 50 MG tablet [id number] Yes Take 1 tablet (50 mg total) by mouth daily as needed for erectile dysfunction. At least 24 hours between doses Newlin, Enobong, MD Taking Active   valsartan (DIOVAN) 160 MG tablet [id number] Yes Take 1 tablet (160 mg total) by mouth daily. Joaquin Mulberry, MD Taking Active             Recommendation:    PCP Follow-up 12/03/23 [phone] Specialty provider follow-up -Post-op Drv Julio Ohm [phone] Referral to: Outpatient Rehab in [place] , [place] call 11/30/23 [facility] Pharmacist for disease management ( pending)  Home Health requests:[facility]    Follow Up Plan:   Telephone follow up appointment date/time:  12/04/23 @ 9:30am  with Arna Better, RN  Call kaced to Dr Julio Ohm regarding follow-up isit and his reports of increased pain and needing additional pain medication The office will call patient re appointment and additional medication   James Mcardle , BSN, RN [facility]   [facility]BC[facility]-Population Health RN Care Manager Direct Dial [phone]  Fax: [phone] Website: [email or web address]

## 2023-11-26 NOTE — Patient Instructions (Signed)
 Visit Information  Thank you for taking time to visit with me today. Please don't hesitate to contact me if I can be of assistance to you before our next scheduled telephone appointment.  Our next appointment is by telephone on 4/25  at 9:30am with Estanislado Emms RN   Following is a copy of your care plan:   Goals Addressed             This Visit's Progress    [facility] Transitions of Care (TOC) Care Plan       Problems:  Recent Hospitalization for treatment of DMII and Diabetic wound  Diet/Nutrition/Food Resources , Equipment/DME new w/c , Functional/Safety concern: fall risk , needs assist transfers, ambulation  Home Health services barrier: none ordered on discharge , No Hospital Follow Up Provider appointment He is scheduling today , and No Specialist appointment Surgeon office to call and schedule  Follow up with Nadara Mustard in 2 weeks Specialty: Orthopedic Surgery [address] [place] [address] [phone] Follow up with [facility] at [facility] Specialty: Rehabilitation they will call you for an appointment [address] [place]  Goal:  Over the next 30 days, the patient will not experience hospital readmission  Interventions:  Transitions of Care:  Goal on track:  Yes. Community Resource Referral Made to address Financial Strain Food Insecurity Doctor Visits  - discussed the importance of doctor visits Contacted provider for patient needs Referral for SN PT-[facility]  will begin services also Outpatient [facility] [place]  [phone]   Post discharge activity limitations prescribed by provider reviewed Post-op wound/incision care reviewed with patient/caregiver He has lymphedema pumps and will begin to use 3 x day  Reviewed Signs and symptoms of infection  Diabetes Interventions:  (Status:  New goal.) Short Term Goal Assessed patient's understanding of A1c goal: <8% Provided education to patient about  basic DM disease process Discussed plans with patient for ongoing care management follow up and provided patient with direct contact information for care management team Reviewed scheduled/upcoming provider appointments including: PCP post hospital and Surgical follow-up  Advised patient, providing education and rationale, to check cbg  and record, calling PCP  for findings outside established parameters Referral made to pharmacy team for assistance with DM management Referral made to social work team for assistance with [employer] and navigating SDOH needs  Assessed social determinant of health barriers Lab Results  Component Value Date   HGBA1C 9.1 (H) 11/11/2023    Patient Self Care Activities:  Attend all scheduled provider appointments Call pharmacy for medication refills 3-7 days in advance of running out of medications Call provider office for new concerns or questions  Participate in Transition of Care Program/Attend TOC scheduled calls Perform all self care activities independently  Perform IADL's (shopping, preparing meals, housekeeping, managing finances) independently Take medications as prescribed   Work with the social worker to address care coordination needs and will continue to work with the clinical team to address health care and disease management related needs call scheduled 11/30/23  Work with the pharmacist to address medication management needs and will continue to work with the clinical team to address health care and disease management related needs- pending visit to be scheduled  check blood sugar at prescribed times: three times daily and when you have symptoms of low or high blood sugar check feet daily for cuts, sores or redness enter blood sugar readings and medication or insulin into daily log take the blood sugar log  to all doctor visits trim toenails straight across drink 6 to 8 glasses of water each day limit fast food meals to no more than 1 per  week wash and dry feet carefully every day wear comfortable, cotton socks wear comfortable, well-fitting shoes  Plan:  Telephone follow up appointment with care management team member scheduled for:  12/04/23 9:30am with Arna Better RN  The patient has been provided with contact information for the care management team and has been advised to call with any health related questions or concerns.        Medication review  Reviewed current home medications -- provided education as needed. Patient is aware of potential side effects and was encouraged to notify PCP for any adverse side effects or unwanted symptoms not relieved with interventions    Reviewed goals for care Patient/ Caregiver verbalizes understanding of instructions with the plan of care . The  Patient / Caregiver was encouraged to make informed decisions about care, actively participate in managing health conditions, and implement lifestyle changes as needed to promote independence and self-management of healthcare. SDOH screenings have been completed and addressed if indicted.  There are no reported barriers to care.    Follow-up Plan 1.If not already completed- Please call scheduled a post hospital Follow up appointment  with your  PCP within in 1-2 weeks of your hospital discharge date -when scheduling please ask your Primary MD to get all Hospital records sent to his/her office. Take all your medications and your discharge paperwork with you for your next visit with your Primary MD-   Be sure to request your Primary MD to go over all hospital tests and procedure/radiological results at the follow up appointment ,   [facility] Case Management Nurse will provide follow-up and on-going assessment ,evaluation and education of disease processes, recommended interventions for both chronic and acute medical conditions ,  along with ongoing review of symptoms ,medication reviews / reconciliation during each weekly call . Any updates ,  inconsistencies, discrepancies or acute care concerns will be addressed and routed to the correct Practitioner if indicated     [facility]  Please call the care guide team at [phone]  if you need to cancel or reschedule your appointment . For scheduled calls -Three attempts will be made to reach you -if the scheduled call is missed or  we are unable to reach the you after 3 attempts no additional outreach attempts will be made and the TOC follow-up will be closed .   If you need to speak to a Nurse you may  call me directly at the number below or if I am unavailable,and  your need is urgent  please call the main [facility] number at [phone] and ask to speak with one of the [facility] ( Transition of Care )  Nurses  .  Patient was encouraged to Contact PCP with any changes in baseline or  medication regimen,  changes in health status  /  well-being, safety concerns, including falls any questions or concerns regarding ongoing medical care, any difficulty obtaining or picking up prescriptions, any changes or worsening in condition- including  symptoms not relieved  with interventions  Additionally, If you experience worsening of your symptoms, develop shortness of breath, If you are experiencing a medical emergency,  develop suicidal or homicidal thoughts you must seek medical attention immediately by calling 911 or report to your local emergency department or urgent care.   If you have a non-emergency medical problem during routine business hours, please contact your provider's office and ask to speak with a nurse.       Please take the time to read instructions/literature along with the possible adverse reactions/side effects for all the Medicines that have been prescribed to you. Only take newly prescribed  Medications after you have completely understood and accept all the possible adverse reactions/side effects.    Do not take more than prescribed Medications for  Pain, Sleep and Anxiety. Do not drive when taking Pain medications or sleep aid/ insomnia  medications It is not advisable to combine anxiety, sleep and pain medications without talking with your primary care practitioner    If you are experiencing a Mental Health or Behavioral Health Crisis or need someone to talk to Please call the Suicide and Crisis Lifeline: 95 You may also call the [place]  National Suicide Prevention Lifeline: 3250846109 or TTY: 331-785-1572 TTY 8310412426) to talk to a trained counselor.  You may call the Behavioral Health Crisis Line at (609)441-9930, at any time, 24 hours a day, 7 days a week- however If you are in danger or need immediate medical attention, call 911.   If you would like help to quit smoking, call 1-800-QUIT-NOW ( 725 774 4319) OR Espaol: 1-855-Djelo-Ya (7-253-664-4034) o para ms informacin haga clic aqu or Text READY to 742-595 to register via text.   The patient has been provided with contact information for the care management team and has been advised to call with any health-related questions or concerns. Follow up call  with Care Team  as scheduled,or sooner should any new problems arise.  James Mcardle , BSN, RN [place]   [facility]BCI-Population Health RN Care Manager Direct Dial [phone]  Website: [email or web address]

## 2023-11-30 ENCOUNTER — Encounter: Payer: Self-pay | Admitting: Licensed Clinical Social Worker

## 2023-11-30 ENCOUNTER — Other Ambulatory Visit: Payer: Self-pay | Admitting: Licensed Clinical Social Worker

## 2023-12-01 ENCOUNTER — Other Ambulatory Visit: Payer: Self-pay

## 2023-12-01 DIAGNOSIS — Q820 Hereditary lymphedema: Secondary | ICD-10-CM | POA: Diagnosis not present

## 2023-12-01 DIAGNOSIS — G90513 Complex regional pain syndrome I of upper limb, bilateral: Secondary | ICD-10-CM | POA: Diagnosis not present

## 2023-12-01 DIAGNOSIS — R202 Paresthesia of skin: Secondary | ICD-10-CM | POA: Diagnosis not present

## 2023-12-01 DIAGNOSIS — Z79891 Long term (current) use of opiate analgesic: Secondary | ICD-10-CM | POA: Diagnosis not present

## 2023-12-01 DIAGNOSIS — G894 Chronic pain syndrome: Secondary | ICD-10-CM | POA: Diagnosis not present

## 2023-12-01 MED ORDER — NALOXONE HCL 4 MG/0.1ML NA LIQD
1.0000 | Freq: Every day | NASAL | 0 refills | Status: DC
  Filled 2024-01-22: qty 4, 4d supply, fill #0
  Filled 2024-01-22: qty 2, 2d supply, fill #0

## 2023-12-02 NOTE — Progress Notes (Unsigned)
 Patient ID: Taylor Hughes, male   DOB: [birth year], 53 y.o.   MRN: [id number]    Ellsworth Waldschmidt, is a 53 y.o. male  KWI:[id number]  MEQ:[id number]  DOB - 1970/12/07  Chief Complaint  Patient presents with   Medical Management of Chronic Issues    Discuss ozempic         Subjective:   Taylor Hughes is a 52 y.o. male here today for a follow up visit After hospitalization 4/1 to 11/17/2023 with DKA, pyelo, sepsis secondary to diabetic foot infection.  Foot is being followed by Dr Julio Ohm.  Healing well.  He completed antibiotics.  Blood sugars running from 88 to 200 since discharge.  His appetite is good.  BP at home has been ~120s/80s.  Denies CP/HA/Dizziness.  No urinary s/sx.  He has been able to get his meds and is taking them.  He is interested in getting a CGM again.    From discharge summary: Brief/Interim Summary:   52/M with history of type 2 diabetes mellitus, DKA, gastroparesis, right foot transmetatarsal amputation, lymphedema, morbid obesity presented to the ED with weakness, elevated blood sugars, some nausea vomiting and diarrhea, has not been taking insulin  or checking blood sugars as he feels poorly, in the ED he was noted to have significant sinus tachycardia, CBGs 560s, bicarb of 9, diabetic ketoacidosis, creatinine 2.0, WBC 15.1, CT abdomen with bilateral perinephric stranding but he denies any urinary symptoms, and UA was negative, he was noted to have diabetic foot wound, infected, but MRI did not show any evidence of abscess or osteomyelitis, much improved with IV antibiotics, seen and evaluated by orthopedic.   Sepsis, POA Diabetic foot wound section. -Source of sepsis most likely related to left foot diabetic wound, MRI of left foot has been obtained, fortunately no evidence of abscess osteomyelitis - Blood cultures remain negative, but it has been narrowed, initially on vancomycin  and Rocephin , he will be discharged on another 3 days of doxycycline .   - Orthopedic input greatly  appreciated, continue with wound care and PRAFO boot left foot,Dr. Julio Ohm to follow as an outpatient after discharge  - Leukocytosis and procalcitonin trending down   Diabetes mellitus, type II, uncontrolled with hyperglycemia, DKA on presentation -A1c is 9.2 -Discussed with the patient, reports poor compliance few days prior to admission. -Transitioned off insulin  drip to subcu insulin  as anion gap has closed.  All CBG is much improved after increasing his Levemir , his insulin  dose has been changed, he will be discharged on 27 units on discharge, and sliding scale.   Blood pressure  - Blood pressure is elevated, resumed on home dose losartan and increased his metoprolol .   Paroxysmal atrial fibrillation -Not on anticoagulation at baseline, currently with sinus tachycardia, continue metoprolol , dose increased   ?  Pyelonephritis -Some perinephric stranding noted on CT yesterday, has chronic back pain, doubt related to this, urinalysis pending, UA is negative,    Acute kidney injury -In the setting of DKA and dehydration, improving, back to baseline   Chronic anemia - At baseline.   Severe hypophosphatemia Hypokalemia -replaced   Chronic diastolic CHF -Echo 8/24 with EF 75%, indeterminate diastolic function, he is euvolemic   2 cm right adrenal nodule -further  workup and imaging as an outpatient     Discharge Diagnoses:  Principal Problem:   DKA, type 2 (HCC) Active Problems:   DKA (diabetic ketoacidosis) (HCC)   Essential hypertension   AKI (acute kidney injury) (HCC)   Morbid obesity with BMI of 45.0-49.9, adult (  HCC)   Anemia   Atrial fibrillation, chronic (HCC)   HLD (hyperlipidemia)   Gastroparesis   Lymphedema   Flank pain   Left thigh pain   Neck pain   Elevated troponin   Lactic acidosis   Acute pyelonephritis   Non-pressure chronic ulcer of other part of left foot limited to breakdown of skin (HCC)   Charcot joint of left foot  ALLERGIES: No Known  Allergies  PAST MEDICAL HISTORY: Past Medical History:  Diagnosis Date   Atrial fibrillation (HCC)    Diabetes mellitus without complication (HCC)    Type 2   GERD (gastroesophageal reflux disease)    Hyperlipidemia    Hypertension    Morbid (severe) obesity due to excess calories (HCC) 08/07/2023   bmi 44.63   Wears glasses    Wound, open, foot    left diabetic     MEDICATIONS AT HOME: Prior to Admission medications   Medication Sig Start Date End Date Taking? Authorizing Provider  Accu-Chek Softclix Lancets lancets Use to check blood sugar 3 times daily. 06/02/23  Yes Newlin, Enobong, MD  Blood Glucose Monitoring Suppl (ACCU-CHEK GUIDE) w/Device KIT Use to check blood sugar 3 times daily. 06/02/23  Yes Newlin, Enobong, MD  Continuous Glucose Receiver (FREESTYLE LIBRE 3 READER) DEVI 1 each by Does not apply route every 14 (fourteen) days. 12/03/23  Yes Rondle Lohse, Stan Eans, PA-C  Continuous Glucose Sensor (FREESTYLE LIBRE 3 SENSOR) MISC 1 each by Does not apply route every 14 (fourteen) days. Place 1 sensor on the skin every 14 days. Use to check glucose continuously 12/03/23  Yes Arty Lantzy M, PA-C  gabapentin  (NEURONTIN ) 300 MG capsule Take 1 capsule (300 mg total) by mouth 2 (two) times daily. 10/06/23  Yes Newlin, Lavelle Posey, MD  glucose blood (ACCU-CHEK GUIDE TEST) test strip use as directed 3 (three) times daily. 06/02/23  Yes Newlin, Lavelle Posey, MD  glucose blood (ACCU-CHEK GUIDE) test strip Use to check blood sugar 3 times daily. 06/02/23  Yes Newlin, Enobong, MD  insulin  glargine (LANTUS  SOLOSTAR) 100 UNIT/ML Solostar Pen Inject 27 Units into the skin daily. 11/17/23  Yes Elgergawy, Ardia Kraft, MD  insulin  lispro (HUMALOG  KWIKPEN) 100 UNIT/ML KwikPen Inject 0-12 units into the skin three times daily with meals. Per sliding scale. 07/22/23  Yes Newlin, Enobong, MD  Insulin  Pen Needle 32G X 6 MM MISC USE AS DIRECTED 11/17/22  Yes Newlin, Enobong, MD  Menthol , Topical Analgesic, (ICY HOT  BACK) 5 % PTCH Apply 1 patch topically daily. 11/25/23  Yes Newlin, Lavelle Posey, MD  naloxone  (NARCAN ) nasal spray 4 mg/0.1 mL USE 1 SPRAY ON 1 NOSTRIL ONCE, MAY REPEAT DOSE EVERY 2 TO 3 MINUTES UNTIL REPSONSIVE OR EMS ARRIVE FOR AN OPIOID EMERGENCY. 11/10/23  Yes   naloxone  (NARCAN ) nasal spray 4 mg/0.1 mL Place 1 spray into the nose daily. 12/01/23  Yes   sildenafil  (VIAGRA ) 50 MG tablet Take 1 tablet (50 mg total) by mouth daily as needed for erectile dysfunction. At least 24 hours between doses 11/25/23  Yes Newlin, Enobong, MD  atorvastatin  (LIPITOR) 20 MG tablet Take 1 tablet (20 mg total) by mouth daily. 12/03/23   Hassie Lint, PA-C  metoprolol  tartrate (LOPRESSOR ) 50 MG tablet Take 1 tablet (50 mg total) by mouth 2 (two) times daily. 12/03/23   Hassie Lint, PA-C  omeprazole  (PRILOSEC) 40 MG capsule Take 1 capsule (40 mg total) by mouth daily. 12/03/23   Hassie Lint, PA-C  valsartan  (DIOVAN ) 160 MG tablet  Take 1 tablet (160 mg total) by mouth daily. 12/03/23   Hassie Lint, PA-C  furosemide  (LASIX ) 20 MG tablet Take 2 tablets (40 mg total) by mouth daily. Patient not taking: Reported on 08/07/2023 05/14/23 11/04/23  Joaquin Mulberry, MD    ROS: Neg HEENT Neg resp Neg cardiac Neg GI Neg GU Neg MS Neg psych Neg neuro  Objective:   Vitals:   12/03/23 1338  BP: (!) 149/96  Pulse: 84  SpO2: 97%  Weight: (!) 343 lb 9.6 oz (155.9 kg)  Height: 5\' 11"  (1.803 m)   Exam General appearance : Awake, alert, not in any distress. Speech Clear. Not toxic looking HEENT: Atraumatic and Normocephalic Neck: Supple, no JVD. No cervical lymphadenopathy.  Chest: Good air entry bilaterally, CTAB.  No rales/rhonchi/wheezing CVS: S1 S2 regular, no murmurs.  Extremities: B/L Lower Ext shows no edema, both legs are warm to touch Neurology: Awake alert, and oriented X 3, CN II-XII intact, Non focal Skin: No Rash  Data Review Lab Results  Component Value Date   HGBA1C 9.1 (H) 11/11/2023    HGBA1C 7.9 (A) 06/25/2023   HGBA1C 9.8 (H) 03/23/2023    Assessment & Plan   1. Type 2 diabetes mellitus with hyperglycemia, unspecified whether long term insulin  use (HCC) (Primary) improving - Glucose (CBG) - Continuous Glucose Sensor (FREESTYLE LIBRE 3 SENSOR) MISC; 1 each by Does not apply route every 14 (fourteen) days. Place 1 sensor on the skin every 14 days. Use to check glucose continuously  Dispense: 4 each; Refill: 3 - Continuous Glucose Receiver (FREESTYLE LIBRE 3 READER) DEVI; 1 each by Does not apply route every 14 (fourteen) days.  Dispense: 1 each; Refill: 0  2. Gastroesophageal reflux disease without esophagitis stable - omeprazole  (PRILOSEC) 40 MG capsule; Take 1 capsule (40 mg total) by mouth daily.  Dispense: 90 capsule; Refill: 1  3. Type 2 diabetes mellitus with diabetic polyneuropathy, with long-term current use of insulin  (HCC) Improving.  Continue lantus  27 units daily and SS.   - atorvastatin  (LIPITOR) 20 MG tablet; Take 1 tablet (20 mg total) by mouth daily.  Dispense: 90 tablet; Refill: 0  4. Dyslipidemia - atorvastatin  (LIPITOR) 20 MG tablet; Take 1 tablet (20 mg total) by mouth daily.  Dispense: 90 tablet; Refill: 0  5. Essential hypertension Controlled based on home readings.  Bring numbers in for f/up with [place] - metoprolol  tartrate (LOPRESSOR ) 50 MG tablet; Take 1 tablet (50 mg total) by mouth 2 (two) times daily.  Dispense: 60 tablet; Refill: 0 - valsartan  (DIOVAN ) 160 MG tablet; Take 1 tablet (160 mg total) by mouth daily.  Dispense: 90 tablet; Refill: 0  6. Hospital discharge follow-up  7. Diabetic foot infection [facility]) Doing well-followed by Dr Julio Ohm    Return in about 4 weeks (around 12/31/2023) for [facility] in 1 month; PCP in 2 months.  The patient was given clear instructions to go to ER or return to medical center if symptoms don't improve, worsen or new problems develop. The patient verbalized understanding. The patient was told to call to  get lab results if they haven't heard anything in the next week.      Dulce Gibbs, PA-C [facility] [place], [place] [phone]   12/03/2023, 1:54 PM

## 2023-12-03 ENCOUNTER — Other Ambulatory Visit: Payer: Self-pay

## 2023-12-03 ENCOUNTER — Ambulatory Visit: Attending: Physician Assistant | Admitting: Physician Assistant

## 2023-12-03 ENCOUNTER — Encounter: Payer: Self-pay | Admitting: Physician Assistant

## 2023-12-03 VITALS — BP 149/96 | HR 84 | Ht 71.0 in | Wt 343.6 lb

## 2023-12-03 DIAGNOSIS — E1165 Type 2 diabetes mellitus with hyperglycemia: Secondary | ICD-10-CM

## 2023-12-03 DIAGNOSIS — E1142 Type 2 diabetes mellitus with diabetic polyneuropathy: Secondary | ICD-10-CM

## 2023-12-03 DIAGNOSIS — Z794 Long term (current) use of insulin: Secondary | ICD-10-CM | POA: Diagnosis not present

## 2023-12-03 DIAGNOSIS — E785 Hyperlipidemia, unspecified: Secondary | ICD-10-CM

## 2023-12-03 DIAGNOSIS — Z09 Encounter for follow-up examination after completed treatment for conditions other than malignant neoplasm: Secondary | ICD-10-CM

## 2023-12-03 DIAGNOSIS — K219 Gastro-esophageal reflux disease without esophagitis: Secondary | ICD-10-CM | POA: Diagnosis not present

## 2023-12-03 DIAGNOSIS — L089 Local infection of the skin and subcutaneous tissue, unspecified: Secondary | ICD-10-CM

## 2023-12-03 DIAGNOSIS — I10 Essential (primary) hypertension: Secondary | ICD-10-CM

## 2023-12-03 DIAGNOSIS — E11628 Type 2 diabetes mellitus with other skin complications: Secondary | ICD-10-CM | POA: Diagnosis not present

## 2023-12-03 LAB — GLUCOSE, POCT (MANUAL RESULT ENTRY): POC Glucose: 160 mg/dL — AB (ref 70–99)

## 2023-12-03 MED ORDER — METOPROLOL TARTRATE 50 MG PO TABS
50.0000 mg | ORAL_TABLET | Freq: Two times a day (BID) | ORAL | 0 refills | Status: DC
  Filled 2023-12-03 – 2023-12-28 (×2): qty 60, 30d supply, fill #0

## 2023-12-03 MED ORDER — FREESTYLE LIBRE 3 READER DEVI
1.0000 | 0 refills | Status: DC
  Filled 2023-12-03: qty 1, 30d supply, fill #0
  Filled 2023-12-15: qty 1, 1d supply, fill #0

## 2023-12-03 MED ORDER — FREESTYLE LIBRE 3 SENSOR MISC
1.0000 | 3 refills | Status: DC
  Filled 2023-12-03: qty 2, 30d supply, fill #0
  Filled 2023-12-15: qty 4, 56d supply, fill #0
  Filled 2024-01-08: qty 2, 28d supply, fill #1
  Filled 2024-01-22 – 2024-01-26 (×2): qty 4, 56d supply, fill #1
  Filled 2024-04-05: qty 4, 56d supply, fill #2

## 2023-12-03 MED ORDER — OMEPRAZOLE 40 MG PO CPDR
40.0000 mg | DELAYED_RELEASE_CAPSULE | Freq: Every day | ORAL | 1 refills | Status: DC
  Filled 2023-12-03 – 2024-01-22 (×3): qty 90, 90d supply, fill #0

## 2023-12-03 MED ORDER — ATORVASTATIN CALCIUM 20 MG PO TABS
20.0000 mg | ORAL_TABLET | Freq: Every day | ORAL | 0 refills | Status: DC
  Filled 2023-12-03: qty 90, 90d supply, fill #0

## 2023-12-03 MED ORDER — VALSARTAN 160 MG PO TABS
160.0000 mg | ORAL_TABLET | Freq: Every day | ORAL | 0 refills | Status: DC
  Filled 2023-12-03 – 2024-01-22 (×2): qty 90, 90d supply, fill #0

## 2023-12-04 ENCOUNTER — Other Ambulatory Visit: Payer: Self-pay | Admitting: *Deleted

## 2023-12-04 ENCOUNTER — Other Ambulatory Visit: Payer: Self-pay | Admitting: Family Medicine

## 2023-12-04 MED ORDER — MISC. DEVICES MISC
0 refills | Status: DC
Start: 2023-12-04 — End: 2023-12-25

## 2023-12-04 NOTE — Transitions of Care (Post Inpatient/ED Visit) (Signed)
 Transition of Care week 3  Visit Note  12/04/2023  Name: Taylor Hughes MRN: [id number]          DOB: [birth year]  Situation: Patient enrolled in [facility] 30-day program. Visit completed with Taylor Hughes by telephone.   Background:   Initial Transition Care Management Follow-up Telephone Call    Past Medical History:  Diagnosis Date   Atrial fibrillation (HCC)    Diabetes mellitus without complication (HCC)    Type 2   GERD (gastroesophageal reflux disease)    Hyperlipidemia    Hypertension    Morbid (severe) obesity due to excess calories (HCC) 08/07/2023   bmi 44.63   Wears glasses    Wound, open, foot    left diabetic     Assessment: Patient Reported Symptoms: Cognitive Cognitive Status: Alert and oriented to person, place, and time, Able to follow simple commands, Normal speech and language skills   Health Maintenance Behaviors: Annual physical exam Healing Pattern: Average Health Facilitated by: Stress management, Pain control  Neurological Neurological Review of Symptoms: No symptoms reported    HEENT HEENT Symptoms Reported: No symptoms reported      Cardiovascular Cardiovascular Symptoms Reported: Swelling in legs or feet Does patient have uncontrolled Hypertension?: Yes Is patient checking Blood Pressure at home?: No Cardiovascular Conditions: Hypertension, High blood cholesterol Cardiovascular Management Strategies: Medication therapy, Routine screening Cardiovascular Self-Management Outcome: 3 (uncertain) Cardiovascular Comment: Patient is needing a BP monitor to check BP at home  Respiratory Respiratory Symptoms Reported: No symptoms reported    Endocrine Is patient diabetic?: Yes Is patient checking blood sugars at home?: Yes Endocrine Conditions: Diabetes Endocrine Management Strategies: Diet modification, Routine screening, Medication therapy Endocrine Self-Management Outcome: 4 (good)  Gastrointestinal Gastrointestinal Symptoms Reported: No symptoms  reported      Genitourinary Genitourinary Symptoms Reported: No symptoms reported    Integumentary   Skin Conditions: Wound Skin Management Strategies: Adequate rest, Coping strategies, Dressing changes Skin Self-Management Outcome: 3 (uncertain) Skin Comment: Follow up with Dr. Julio Ohm on 12/07/23  Musculoskeletal Musculoskelatal Symptoms Reviewed: Difficulty walking, Unsteady gait, Weakness Musculoskeletal Management Strategies: Medication therapy, Routine screening, Medical device Musculoskeletal Self-Management Outcome: 3 (uncertain) Musculoskeletal Comment: Using a cane as needed Falls in the past year?: No Patient at Risk for Falls Due to: Impaired balance/gait, Impaired mobility  Psychosocial Psychosocial Symptoms Reported: No symptoms reported   Major Change/Loss/Stressor/Fears (CP): Medical condition, self     There were no vitals filed for this visit.  Medications Reviewed Today     Reviewed by Aura Leeds, RN (Registered Nurse) on 12/04/23 at [phone]  Med List Status: <None>   Medication Order Taking? Sig Documenting Provider Last Dose Status Informant  Accu-Chek Softclix Lancets lancets [id number] Yes Use to check blood sugar 3 times daily. Newlin, Enobong, MD Taking Active Self, Pharmacy Records  atorvastatin  (LIPITOR) 20 MG tablet [id number] Yes Take 1 tablet (20 mg total) by mouth daily. Hassie Lint, PA-C Taking Active   Blood Glucose Monitoring Suppl (ACCU-CHEK GUIDE) w/Device KIT [id number] Yes Use to check blood sugar 3 times daily. Newlin, Enobong, MD Taking Active Self, Pharmacy Records  Continuous Glucose Receiver (FREESTYLE LIBRE 3 READER) DEVI [id number] No Use as directed  every 14 (fourteen) days.  Patient not taking: Reported on 12/04/2023   Hassie Lint, PA-C Not Taking Active   Continuous Glucose Sensor (FREESTYLE LIBRE 3 SENSOR) [place] [id number] No Use as directed .Place 1 sensor on the skin every 14 days. Use to check glucose continuously  Patient not  taking: Reported on 12/04/2023   Vessie Gouge Not Taking Active    Patient not taking:   Discontinued 11/04/23 1041 (Patient Preference)            Med Note>> Tommie Frame, CPhT   11/04/2023 10:41 AM Pt states he is no longer taking    gabapentin  (NEURONTIN ) 300 MG capsule [id number] Yes Take 1 capsule (300 mg total) by mouth 2 (two) times daily. Newlin, Enobong, MD Taking Active Self, Pharmacy Records  glucose blood (ACCU-CHEK GUIDE TEST) test strip [id number] Yes use as directed 3 (three) times daily. Newlin, Enobong, MD Taking Active Self, Pharmacy Records  glucose blood (ACCU-CHEK GUIDE) test strip [id number] Yes Use to check blood sugar 3 times daily. Newlin, Enobong, MD Taking Active Self, Pharmacy Records  insulin  glargine (LANTUS  SOLOSTAR) 100 UNIT/ML Solostar Pen [id number] Yes Inject 27 Units into the skin daily. Elgergawy, Ardia Kraft, MD Taking Active            Med Note (Deasiah Hagberg A   Fri Dec 04, 2023  9:40 AM) 26 units every morning  insulin  lispro (HUMALOG  KWIKPEN) 100 UNIT/ML KwikPen [id number] Yes Inject 0-12 units into the skin three times daily with meals. Per sliding scale. Newlin, Enobong, MD Taking Active Self, Pharmacy Records  Insulin  Pen Needle 32G X 6 MM MISC [id number] Yes USE AS DIRECTED Joaquin Mulberry, MD Taking Active Self, Pharmacy Records  Menthol , Topical Analgesic, (ICY HOT BACK) 5 % PTCH [id number] No Apply 1 patch topically daily.  Patient not taking: Reported on 12/04/2023   Newlin, Enobong, MD Not Taking Active   metoprolol  tartrate (LOPRESSOR ) 50 MG tablet [id number] Yes Take 1 tablet (50 mg total) by mouth 2 (two) times daily. Dulce Gibbs M, PA-C Taking Active   naloxone  (NARCAN ) nasal spray 4 mg/0.1 mL [id number] No USE 1 SPRAY ON 1 NOSTRIL ONCE, MAY REPEAT DOSE EVERY 2 TO 3 MINUTES UNTIL REPSONSIVE OR EMS ARRIVE FOR AN OPIOID EMERGENCY.  Patient not taking: Reported on 12/04/2023    Not Taking Active   naloxone  (NARCAN ) nasal spray 4 mg/0.1 mL  [id number] No Place 1 spray into the nose daily.  Patient not taking: Reported on 12/04/2023    Not Taking Active   omeprazole  (PRILOSEC) 40 MG capsule [id number] Yes Take 1 capsule (40 mg total) by mouth daily. Dulce Gibbs M, PA-C Taking Active   sildenafil  (VIAGRA ) 50 MG tablet [id number] Yes Take 1 tablet (50 mg total) by mouth daily as needed for erectile dysfunction. At least 24 hours between doses Newlin, Enobong, MD Taking Active   valsartan  (DIOVAN ) 160 MG tablet [id number] Yes Take 1 tablet (160 mg total) by mouth daily. Hassie Lint, PA-C Taking Active             Recommendation:   BP device for home monitoring  Follow Up Plan:   Telephone follow-up in 1 week  Arna Better RN, BSN [place]  [facility] RN Care Manager [phone]

## 2023-12-04 NOTE — Patient Instructions (Signed)
 Visit Information  Thank you for taking time to visit with me today. Please don't hesitate to contact me if I can be of assistance to you before our next scheduled telephone appointment.  Our next appointment is by telephone on 12/11/23 at 10am  Following is a copy of your care plan:   Goals Addressed             This Visit's Progress    VBCI Transitions of Care (TOC) Care Plan       Problems:  Recent Hospitalization for treatment of DMII and Diabetic wound  Diet/Nutrition/Food Resources , Equipment/DME new w/c , Functional/Safety concern: fall risk , needs assist transfers, ambulation  Home Health services barrier: none ordered on discharge , No Hospital Follow Up Provider appointment He is scheduling today , and No Specialist appointment Surgeon office to call and schedule    Goal:  Over the next 30 days, the patient will not experience hospital readmission  Interventions:  Transitions of Care:  Goal on track:  Yes. Community Resource Referral Made to address Financial Strain Food Insecurity-referral to [employer] declined Doctor Visits  - discussed the importance of doctor visits Reviewed hospital follow up visit with PCP on 12/03/23  Post discharge activity limitations prescribed by provider reviewed Post-op wound/incision care reviewed with patient-reviewed follow up with Dr. Julio Ohm on 12/07/23 Reviewed Signs and symptoms of infection Discussed PT evaluation on 12/10/23, advised patient to keep appointment Ensured patient has transportation to upcoming appointments Collaboration with PCP, requesting order for BP monitor  Diabetes Interventions:  (Status:  Goal on track:  Yes.) Short Term Goal Assessed patient's understanding of A1c goal: <8% Provided education to patient about basic DM disease process Discussed plans with patient for ongoing care management follow up and provided patient with direct contact information for care management team Reviewed scheduled/upcoming  provider appointments including: Dr. Julio Ohm on 12/07/23, PT evaluation on 12/10/23 and Pharmacy on 01/01/24  Advised patient, providing education and rationale, to check cbg  and record, calling PCP  for findings outside established parameters  Assessed social determinant of health barriers Lab Results  Component Value Date   HGBA1C 9.1 (H) 11/11/2023    Patient Self Care Activities:  Attend all scheduled provider appointments Call pharmacy for medication refills 3-7 days in advance of running out of medications Call provider office for new concerns or questions  Participate in Transition of Care Program/Attend [facility] scheduled calls Perform all self care activities independently  Perform IADL's (shopping, preparing meals, housekeeping, managing finances) independently Take medications as prescribed   Work with the social worker to address care coordination needs and will continue to work with the clinical team to address health care and disease management related needs call scheduled 11/30/23  Work with the pharmacist to address medication management needs and will continue to work with the clinical team to address health care and disease management related needs- pending visit to be scheduled  check blood sugar at prescribed times: three times daily and when you have symptoms of low or high blood sugar check feet daily for cuts, sores or redness enter blood sugar readings and medication or insulin  into daily log take the blood sugar log to all doctor visits trim toenails straight across drink 6 to 8 glasses of water  each day limit fast food meals to no more than 1 per week wash and dry feet carefully every day wear comfortable, cotton socks wear comfortable, well-fitting shoes  Plan:  Telephone follow up appointment with care management  team member scheduled for:  12/11/23 10 am with Candice Chalet, RN  The patient has been provided with contact information for the care management team and has been  advised to call with any health related questions or concerns.         Patient verbalizes understanding of instructions and care plan provided today and agrees to view in MyChart. Active MyChart status and patient understanding of how to access instructions and care plan via MyChart confirmed with patient.     Telephone follow up appointment with care management team member scheduled for:  Please call the care guide team at [phone] if you need to cancel or reschedule your appointment.   Please call 1-800-273-TALK (toll free, 24 hour hotline) go to [facility] Urgent [facility] [address] [place] [phone]) call 911 if you are experiencing a Mental Health or Behavioral Health Crisis or need someone to talk to.  Arna Better RN, BSN [place]  [facility] RN Care Manager [phone]

## 2023-12-07 ENCOUNTER — Encounter: Admitting: Orthopedic Surgery

## 2023-12-10 ENCOUNTER — Ambulatory Visit: Attending: Internal Medicine | Admitting: Physical Therapy

## 2023-12-10 ENCOUNTER — Telehealth: Payer: Self-pay | Admitting: *Deleted

## 2023-12-10 NOTE — Progress Notes (Unsigned)
 Complex Care Management Care Guide Note  12/10/2023 Name: Taylor Hughes MRN: [id number] DOB: 11/17/1970  Taylor Hughes is a 53 y.o. year old male who is a primary care patient of Newlin, Enobong, MD and is actively engaged with the care management team. I reached out to Arsenio Bigger by phone today to assist with re-scheduling  with the Licensed Clinical Child psychotherapist.  Follow up plan: 2nd Unsuccessful telephone outreach attempt made. A HIPAA compliant phone message was left for the patient providing contact information and requesting a return call.  Barnie Bora  [facility]  Value[facility]Based [facility] Institute, [facility] Guide  Direct Dial : [phone]  Fax [phone]

## 2023-12-11 ENCOUNTER — Other Ambulatory Visit: Payer: Self-pay

## 2023-12-11 NOTE — Transitions of Care (Post Inpatient/ED Visit) (Signed)
 Transition of Care week 4  Visit Note  12/11/2023  Name: Taylor Hughes MRN: [id number]          DOB: [birth year]  Situation: Patient enrolled in [facility] 30-day program. Visit completed with Arsenio Bigger by telephone.   Background:     Past Medical History:  Diagnosis Date   Atrial fibrillation (HCC)    Diabetes mellitus without complication (HCC)    Type 2   GERD (gastroesophageal reflux disease)    Hyperlipidemia    Hypertension    Morbid (severe) obesity due to excess calories (HCC) 08/07/2023   bmi 44.63   Wears glasses    Wound, open, foot    left diabetic     Assessment: Patient Reported Symptoms: Cognitive Cognitive Status: Alert and oriented to person, place, and time, Normal speech and language skills   Health Maintenance Behaviors: Annual physical exam Healing Pattern: Average Health Facilitated by: Pain control, Stress management  Neurological Neurological Review of Symptoms: No symptoms reported    HEENT HEENT Symptoms Reported: No symptoms reported      Cardiovascular Cardiovascular Symptoms Reported: Swelling in legs or feet Does patient have uncontrolled Hypertension?: Yes Is patient checking Blood Pressure at home?: No Patient's Recent BP reading at home: No reading today Cardiovascular Conditions: High blood cholesterol, Hypertension Cardiovascular Management Strategies: Medication therapy Weight: (!) 343 lb (155.6 kg) Cardiovascular Self-Management Outcome: 3 (uncertain) Cardiovascular Comment: The patient is not checking his BP at home  Respiratory Respiratory Symptoms Reported: No symptoms reported    Endocrine Patient reports the following symptoms related to hypoglycemia or hyperglycemia : No symptoms reported Is patient diabetic?: Yes Is patient checking blood sugars at home?: Yes Endocrine Conditions: Diabetes Endocrine Management Strategies: Diet modification, Medication therapy, Medical device Endocrine Self-Management Outcome: 4  (good) Endocrine Comment: Improving  Gastrointestinal Gastrointestinal Symptoms Reported: No symptoms reported   Nutrition Risk Screen (CP): No indicators present  Genitourinary Genitourinary Symptoms Reported: No symptoms reported    Integumentary Integumentary Symptoms Reported: Wound Additional Integumentary Details: Left diabetic foot wound Skin Conditions: Wound Skin Management Strategies: Adequate rest, Dressing changes, Diet modification Skin Self-Management Outcome: 3 (uncertain) Skin Comment: The patient did not follow up with Dr. Julio Ohm .  Musculoskeletal Musculoskelatal Symptoms Reviewed: Difficulty walking, Unsteady gait, Weakness Musculoskeletal Conditions: Unsteady gait, Joint pain, Back pain, Mobility limited Musculoskeletal Management Strategies: Medication therapy, Medical device, Adequate rest Musculoskeletal Self-Management Outcome: 3 (uncertain) Musculoskeletal Comment: Unsteady gait due to [facility] to left leg and diabetic foot wound Falls in the past year?: No Number of falls in past year: 1 or less Was there an injury with Fall?: No Fall Risk Category Calculator: 0 Patient Fall Risk Level: Low Fall Risk Patient at Risk for Falls Due to: Impaired balance/gait, Impaired mobility, Medication side effect Fall risk Follow up: Falls evaluation completed  Psychosocial Psychosocial Symptoms Reported: No symptoms reported         There were no vitals filed for this visit.  Medications Reviewed Today     Reviewed by Claudene Crystal, RN (Case Manager) on 12/11/23 at 1011  Med List Status: <None>   Medication Order Taking? Sig Documenting Provider Last Dose Status Informant  Accu-Chek Softclix Lancets lancets [id number]  Use to check blood sugar 3 times daily. Newlin, Enobong, MD  Active Self, Pharmacy Records  atorvastatin  (LIPITOR) 20 MG tablet [id number]  Take 1 tablet (20 mg total) by mouth daily. Hassie Lint, PA-C  Active   Blood Glucose Monitoring Suppl  (ACCU-CHEK GUIDE) w/Device KIT [id number]  Use  to check blood sugar 3 times daily. Newlin, Enobong, MD  Active Self, Pharmacy Records  Continuous Glucose Receiver (FREESTYLE LIBRE 3 READER) DEVI [id number]  Use as directed  every 14 (fourteen) days.  Patient not taking: Reported on 12/04/2023   Hassie Lint, PA-C  Active   Continuous Glucose Sensor (FREESTYLE LIBRE 3 [place]) [place] [id number]  Use as directed .Place 1 sensor on the skin every 14 days. Use to check glucose continuously  Patient not taking: Reported on 12/04/2023   Hassie Lint, [place]  Active    Patient not taking:   Discontinued 11/04/23 1041 (Patient Preference)            Med Note>> Tommie Frame, CPhT   11/04/2023 10:41 AM Pt states he is no longer taking    gabapentin  (NEURONTIN ) 300 MG capsule [id number]  Take 1 capsule (300 mg total) by mouth 2 (two) times daily. Newlin, Enobong, MD  Active Self, Pharmacy Records  glucose blood (ACCU-CHEK GUIDE TEST) test strip [id number]  use as directed 3 (three) times daily. Newlin, Enobong, MD  Active Self, Pharmacy Records  glucose blood (ACCU-CHEK GUIDE) test strip [id number]  Use to check blood sugar 3 times daily. Newlin, Enobong, MD  Active Self, Pharmacy Records  insulin  glargine (LANTUS  SOLOSTAR) 100 UNIT/ML Solostar Pen [id number]  Inject 27 Units into the skin daily. Elgergawy, Ardia Kraft, MD  Active            Med Note (ROBB, MELANIE A   Fri Dec 04, 2023  9:40 AM) 26 units every morning  insulin  lispro (HUMALOG  KWIKPEN) 100 UNIT/ML KwikPen [id number]  Inject 0-12 units into the skin three times daily with meals. Per sliding scale. Newlin, Enobong, MD  Active Self, Pharmacy Records  Insulin  Pen Needle 32G X 6 MM MISC [id number]  USE AS DIRECTED Joaquin Mulberry, MD  Active Self, Pharmacy Records  Menthol , Topical Analgesic, (ICY HOT BACK) 5 % PTCH [id number]  Apply 1 patch topically daily.  Patient not taking: Reported on 12/04/2023   Newlin, Enobong, MD  Active   metoprolol   tartrate (LOPRESSOR ) 50 MG tablet [id number]  Take 1 tablet (50 mg total) by mouth 2 (two) times daily. Hassie Lint, PA-C  Active   Misc. Devices MISC [id number]  Blood pressure monitor.  Diagnosis-hypertension. Newlin, Enobong, MD  Active   naloxone  (NARCAN ) nasal spray 4 mg/0.1 mL [id number]  USE 1 SPRAY ON 1 NOSTRIL ONCE, MAY REPEAT DOSE EVERY 2 TO 3 MINUTES UNTIL REPSONSIVE OR EMS ARRIVE FOR AN OPIOID EMERGENCY.  Patient not taking: Reported on 12/04/2023     Active   naloxone  (NARCAN ) nasal spray 4 mg/0.1 mL [id number]  Place 1 spray into the nose daily.  Patient not taking: Reported on 12/04/2023     Active   omeprazole  (PRILOSEC) 40 MG capsule [id number] Yes Take 1 capsule (40 mg total) by mouth daily. Dulce Gibbs M, PA-C Taking Active   sildenafil  (VIAGRA ) 50 MG tablet [id number] Yes Take 1 tablet (50 mg total) by mouth daily as needed for erectile dysfunction. At least 24 hours between doses Newlin, Enobong, MD Taking Active   valsartan  (DIOVAN ) 160 MG tablet [id number] Yes Take 1 tablet (160 mg total) by mouth daily. Hassie Lint, PA-C Taking Active            [facility] Outreach completed to the patient today. He is in a rush because he is going out of town for at least a week to [facility] because his spouse had a  kidney transplant and she is in ICU. He many not be available for week 5 phone call. He is going to pick up his compression garments from the pharmacy today. He states he knows how to change the dressing on his foot. Blood sugar is under better control and is 95 this morning. He cancelled his appointment with Dr. Julio Ohm because his wife is sick.  Recommendation:   Reschedule appointment with Dr. Corinn Dick Compression stockings Continue with insulin  regime Activity as tolerated  Follow Up Plan:   Telephone follow-up in 1 week  Gareld June, BSN, RN [place]  VBCI - [facility] RN Care Manager [phone]

## 2023-12-11 NOTE — Patient Instructions (Addendum)
 Visit Information  Thank you for taking time to visit with me today. Please don't hesitate to contact me if I can be of assistance to you before our next scheduled telephone appointment.  Our next appointment is by telephone on Thursday May 8th at 11:30am  Following is a copy of your care plan:   Goals Addressed             This Visit's Progress    VBCI Transitions of Care (TOC) Care Plan   On track    Problems: (reviewed 12/11/23) Recent Hospitalization for treatment of DMII and Diabetic wound  Diet/Nutrition/Food Resources , Equipment/DME new w/c , Functional/Safety concern: fall risk , needs assist transfers, ambulation  Home Health services barrier: none ordered on discharge , No Hospital Follow Up Provider appointment He is scheduling today , and No Specialist appointment Surgeon office to call and schedule    Goal:  (reviewed 12/11/23) Over the next 30 days, the patient will not experience hospital readmission  Interventions:  Transitions of Care:  Goal on track:  Yes.   (reviewed 12/11/23) Community Resource Referral Made to address Financial Strain Food Insecurity-referral to [employer] declined Doctor Visits  - discussed the importance of doctor visits Reviewed hospital follow up visit with PCP on 12/03/23  Post discharge activity limitations prescribed by provider reviewed Post-op wound/incision care reviewed with patient-reviewed follow up with Dr. Julio Ohm on 12/07/23 - The patient did not go to this appointment. States he will reschedule Reviewed Signs and symptoms of infection Discussed PT evaluation on 12/10/23, advised patient to keep appointment Ensured patient has transportation to upcoming appointments Collaboration with PCP, requesting order for BP monitor  Diabetes Interventions:  (Status:  Goal on track:  Yes.) Short Term Goal  (reviewed 12/11/23) Assessed patient's understanding of A1c goal: <8% Provided education to patient about basic DM disease  process Discussed plans with patient for ongoing care management follow up and provided patient with direct contact information for care management team Reviewed scheduled/upcoming provider appointments including: Dr. Julio Ohm on 12/07/23, PT evaluation on 12/10/23 and Pharmacy on 01/01/24  Advised patient, providing education and rationale, to check cbg  and record, calling PCP  for findings outside established parameters  Assessed social determinant of health barriers Lab Results  Component Value Date   HGBA1C 9.1 (H) 11/11/2023    Patient Self Care Activities:  (reviewed 12/11/23) Attend all scheduled provider appointments Call pharmacy for medication refills 3-7 days in advance of running out of medications Call provider office for new concerns or questions  Participate in Transition of Care Program/Attend [facility] scheduled calls Perform all self care activities independently  Perform IADL's (shopping, preparing meals, housekeeping, managing finances) independently Take medications as prescribed   Work with the social worker to address care coordination needs and will continue to work with the clinical team to address health care and disease management related needs call scheduled 11/30/23  Work with the pharmacist to address medication management needs and will continue to work with the clinical team to address health care and disease management related needs- pending visit to be scheduled  check blood sugar at prescribed times: three times daily and when you have symptoms of low or high blood sugar check feet daily for cuts, sores or redness enter blood sugar readings and medication or insulin  into daily log take the blood sugar log to all doctor visits trim toenails straight across drink 6 to 8 glasses of water  each day limit fast food meals to no more than  1 per week wash and dry feet carefully every day Wear compression stockings wear comfortable, well-fitting shoes  Plan:  (reviewed  12/11/23) Telephone follow up appointment with care management team member scheduled for:  12/11/23 10 am with Candice Chalet, RN  The patient has been provided with contact information for the care management team and has been advised to call with any health related questions or concerns.         Patient verbalizes understanding of instructions and care plan provided today and agrees to view in MyChart. Active MyChart status and patient understanding of how to access instructions and care plan via MyChart confirmed with patient.     The patient has been provided with contact information for the care management team and has been advised to call with any health related questions or concerns.   Please call the care guide team at [phone] if you need to cancel or reschedule your appointment.   Please call the Suicide and Crisis Lifeline: 988 call the [place]  National Suicide Prevention Lifeline: 308-379-9340 or TTY: 709-003-5153 TTY (918)638-7624) to talk to a trained counselor if you are experiencing a Mental Health or Behavioral Health Crisis or need someone to talk to.  Gareld June, BSN, RN [place]  VBCI - [employer] [facility] RN Care Manager [phone]

## 2023-12-14 NOTE — Progress Notes (Signed)
 Complex Care Management Care Guide Note  12/14/2023 Name: Taylor Hughes MRN: [id number] DOB: [birth year]  Taylor Hughes is a 53 y.o. year old male who is a primary care patient of Newlin, Enobong, MD and is actively engaged with the care management team. I reached out to Arsenio Bigger by phone today to assist with re-scheduling  with the Licensed Clinical Child psychotherapist.  Follow up plan: Unsuccessful telephone outreach attempt made. A HIPAA compliant phone message was left for the patient providing contact information and requesting a return call. No further outreach attempts will be made at this time. We have been unable to contact the patient to reschedule for complex care management services.   Barnie Bora  [facility]  Value-Based Care Institute, [facility] Guide  Direct Dial : [phone]  Fax [phone]

## 2023-12-15 ENCOUNTER — Other Ambulatory Visit: Payer: Self-pay

## 2023-12-17 ENCOUNTER — Other Ambulatory Visit: Payer: Self-pay | Admitting: *Deleted

## 2023-12-17 NOTE — Transitions of Care (Post Inpatient/ED Visit) (Signed)
  Transition of Care 5  Visit Note  12/17/2023  Name: Taylor Hughes MRN: [id number]          DOB: [birth year]  Situation: Patient enrolled in [facility] 30-day program. Visit completed with Mr. Diab by telephone.   Background:   Initial Transition Care Management Follow-up Telephone Call    Past Medical History:  Diagnosis Date   Atrial fibrillation (HCC)    Diabetes mellitus without complication (HCC)    Type 2   GERD (gastroesophageal reflux disease)    Hyperlipidemia    Hypertension    Morbid (severe) obesity due to excess calories (HCC) 08/07/2023   bmi 44.63   Wears glasses    Wound, open, foot    left diabetic     Assessment: Patient Reported Symptoms: Cognitive Cognitive Status: Alert and oriented to person, place, and time, Insightful and able to interpret abstract concepts, Normal speech and language skills      Neurological Neurological Review of Symptoms: No symptoms reported    HEENT HEENT Symptoms Reported: No symptoms reported      Cardiovascular Cardiovascular Symptoms Reported: Not assessed    Respiratory Respiratory Symptoms Reported: No symptoms reported    Endocrine Patient reports the following symptoms related to hypoglycemia or hyperglycemia : No symptoms reported Is patient diabetic?: Yes Is patient checking blood sugars at home?: Yes Endocrine Conditions: Diabetes Endocrine Management Strategies: Medical device, Diet modification, Medication therapy, Routine screening Endocrine Self-Management Outcome: 4 (good)  Gastrointestinal Gastrointestinal Symptoms Reported: No symptoms reported      Genitourinary Genitourinary Symptoms Reported: No symptoms reported    Integumentary Integumentary Symptoms Reported: Wound Skin Conditions: Wound Skin Management Strategies: Adequate rest, Routine screening, Dressing changes Skin Self-Management Outcome: 3 (uncertain) Skin Comment: Patient unable to keep follow up appointment with Dr. Julio Ohm. RNCM advised to  reschedule. Patient reports performing dressing changes and wound is looking better.  Musculoskeletal Musculoskelatal Symptoms Reviewed: Difficulty walking, Unsteady gait Musculoskeletal Conditions: Mobility limited, Unsteady gait Musculoskeletal Management Strategies: Adequate rest, Routine screening, Medical device Musculoskeletal Self-Management Outcome: 4 (good)      Psychosocial Psychosocial Symptoms Reported: Other Other Psychosocial Conditions: Patient underging increased stress due to his wife is in ICU in [place] . Patient declined referral to LCSW         There were no vitals filed for this visit.  Medications Reviewed Today   Medications were not reviewed in this encounter     Recommendation:   Transition to CCM-patient declined  Follow Up Plan:   Closing From:  Transitions of Care Program  Arna Better RN, BSN [place]  Value-Based Care Institute [facility] RN Care Manager [phone]

## 2023-12-21 DIAGNOSIS — Z419 Encounter for procedure for purposes other than remedying health state, unspecified: Secondary | ICD-10-CM | POA: Diagnosis not present

## 2023-12-22 ENCOUNTER — Other Ambulatory Visit (HOSPITAL_COMMUNITY): Payer: Self-pay

## 2023-12-22 ENCOUNTER — Other Ambulatory Visit: Payer: Self-pay | Admitting: Family Medicine

## 2023-12-22 ENCOUNTER — Other Ambulatory Visit: Payer: Self-pay

## 2023-12-22 DIAGNOSIS — N528 Other male erectile dysfunction: Secondary | ICD-10-CM

## 2023-12-23 ENCOUNTER — Other Ambulatory Visit: Payer: Self-pay

## 2023-12-23 MED ORDER — SILDENAFIL CITRATE 50 MG PO TABS
50.0000 mg | ORAL_TABLET | Freq: Every day | ORAL | 0 refills | Status: DC | PRN
  Filled 2023-12-23: qty 10, 10d supply, fill #0

## 2023-12-24 ENCOUNTER — Other Ambulatory Visit: Payer: Self-pay

## 2023-12-25 ENCOUNTER — Other Ambulatory Visit: Payer: Self-pay

## 2023-12-25 DIAGNOSIS — I10 Essential (primary) hypertension: Secondary | ICD-10-CM | POA: Diagnosis not present

## 2023-12-25 MED ORDER — MISC. DEVICES MISC: 0 refills | Status: DC

## 2023-12-28 ENCOUNTER — Other Ambulatory Visit: Payer: Self-pay

## 2023-12-28 ENCOUNTER — Other Ambulatory Visit: Payer: Self-pay | Admitting: Family Medicine

## 2023-12-28 DIAGNOSIS — N528 Other male erectile dysfunction: Secondary | ICD-10-CM

## 2023-12-29 ENCOUNTER — Other Ambulatory Visit: Payer: Self-pay

## 2023-12-29 MED ORDER — SILDENAFIL CITRATE 50 MG PO TABS
50.0000 mg | ORAL_TABLET | Freq: Every day | ORAL | 0 refills | Status: DC | PRN
  Filled 2023-12-29 – 2024-01-01 (×3): qty 10, 10d supply, fill #0

## 2023-12-31 ENCOUNTER — Other Ambulatory Visit (HOSPITAL_COMMUNITY): Payer: Self-pay

## 2023-12-31 ENCOUNTER — Other Ambulatory Visit: Payer: Self-pay

## 2024-01-01 ENCOUNTER — Ambulatory Visit: Admitting: Pharmacist

## 2024-01-01 ENCOUNTER — Other Ambulatory Visit: Payer: Self-pay

## 2024-01-08 ENCOUNTER — Other Ambulatory Visit: Payer: Self-pay

## 2024-01-08 ENCOUNTER — Other Ambulatory Visit: Payer: Self-pay | Admitting: Family Medicine

## 2024-01-08 DIAGNOSIS — N528 Other male erectile dysfunction: Secondary | ICD-10-CM

## 2024-01-11 ENCOUNTER — Other Ambulatory Visit: Payer: Self-pay

## 2024-01-11 MED ORDER — SILDENAFIL CITRATE 50 MG PO TABS
50.0000 mg | ORAL_TABLET | Freq: Every day | ORAL | 0 refills | Status: DC | PRN
  Filled 2024-01-11: qty 10, 10d supply, fill #0

## 2024-01-18 ENCOUNTER — Other Ambulatory Visit: Payer: Self-pay

## 2024-01-18 ENCOUNTER — Other Ambulatory Visit: Payer: Self-pay | Admitting: Family Medicine

## 2024-01-18 DIAGNOSIS — N528 Other male erectile dysfunction: Secondary | ICD-10-CM

## 2024-01-20 ENCOUNTER — Other Ambulatory Visit: Payer: Self-pay

## 2024-01-21 ENCOUNTER — Telehealth: Payer: Self-pay | Admitting: Family Medicine

## 2024-01-21 ENCOUNTER — Other Ambulatory Visit: Payer: Self-pay | Admitting: Family Medicine

## 2024-01-21 ENCOUNTER — Other Ambulatory Visit: Payer: Self-pay

## 2024-01-21 DIAGNOSIS — Z419 Encounter for procedure for purposes other than remedying health state, unspecified: Secondary | ICD-10-CM | POA: Diagnosis not present

## 2024-01-21 DIAGNOSIS — N528 Other male erectile dysfunction: Secondary | ICD-10-CM

## 2024-01-21 NOTE — Telephone Encounter (Signed)
 Copied from CRM [id number]. Topic: Clinical - Medication Question >> Jan 21, 2024  1:28 PM Sophia H wrote:  Reason for CRM: Patient is calling in today regarding his sildenafil  (VIAGRA ) 50 MG tablet prescription. Patient states the pharmacy only gives him 10 tablets at a time and he is wanting to know why, patient also states that the 50mg  tablets don't do enough so he is having to take 2 at a time. He is requesting a refill and possible dose change, please reach out to patient and advise of next steps.    [facility] - [facility] [address], [address] [place] [address]

## 2024-01-21 NOTE — Telephone Encounter (Signed)
 Routing to PCP for review.

## 2024-01-22 ENCOUNTER — Other Ambulatory Visit: Payer: Self-pay | Admitting: Physician Assistant

## 2024-01-22 ENCOUNTER — Other Ambulatory Visit: Payer: Self-pay

## 2024-01-22 ENCOUNTER — Other Ambulatory Visit: Payer: Self-pay | Admitting: Family Medicine

## 2024-01-22 DIAGNOSIS — E1142 Type 2 diabetes mellitus with diabetic polyneuropathy: Secondary | ICD-10-CM

## 2024-01-22 DIAGNOSIS — N528 Other male erectile dysfunction: Secondary | ICD-10-CM

## 2024-01-22 DIAGNOSIS — I10 Essential (primary) hypertension: Secondary | ICD-10-CM

## 2024-01-22 MED ORDER — TECHLITE PEN NEEDLES 32G X 6 MM MISC
0 refills | Status: DC
Start: 2024-01-22 — End: 2024-02-21
  Filled 2024-01-22 (×2): qty 100, 25d supply, fill #0

## 2024-01-22 MED ORDER — SILDENAFIL CITRATE 100 MG PO TABS
100.0000 mg | ORAL_TABLET | Freq: Every day | ORAL | 1 refills | Status: DC | PRN
Start: 2024-01-22 — End: 2024-02-27
  Filled 2024-01-22: qty 10, 30d supply, fill #0

## 2024-01-22 MED ORDER — INSULIN LISPRO (1 UNIT DIAL) 100 UNIT/ML (KWIKPEN)
0.0000 [IU] | PEN_INJECTOR | Freq: Three times a day (TID) | SUBCUTANEOUS | 3 refills | Status: DC
Start: 2024-01-22 — End: 2024-02-27
  Filled 2024-01-22 – 2024-02-05 (×2): qty 12, 34d supply, fill #0

## 2024-01-22 MED ORDER — METOPROLOL TARTRATE 50 MG PO TABS
50.0000 mg | ORAL_TABLET | Freq: Two times a day (BID) | ORAL | 0 refills | Status: DC
  Filled 2024-01-22: qty 60, 30d supply, fill #0

## 2024-01-22 NOTE — Addendum Note (Signed)
 Addended by: Isla Sabree on: 01/22/2024 08:54 AM   Modules accepted: Orders

## 2024-01-22 NOTE — Telephone Encounter (Signed)
 I have sent a prescription for an increased dose of 100 mg to his pharmacy.  He is advised not to take an overdose of this medication as he can have complications including persistent erection, brain bleed.

## 2024-01-22 NOTE — Telephone Encounter (Signed)
 Patient was called and VM was left informing patient of medication refill

## 2024-01-26 ENCOUNTER — Other Ambulatory Visit: Payer: Self-pay

## 2024-01-26 ENCOUNTER — Other Ambulatory Visit: Payer: Self-pay | Admitting: Family Medicine

## 2024-01-26 DIAGNOSIS — E1165 Type 2 diabetes mellitus with hyperglycemia: Secondary | ICD-10-CM

## 2024-01-26 NOTE — Telephone Encounter (Unsigned)
 Copied from CRM [id number]. Topic: Clinical - Medication Refill >> Jan 26, 2024 10:12 AM Sophia H wrote: Medication: Continuous Glucose Sensor (FREESTYLE LIBRE 3 SENSOR) MISC  Has the patient contacted their pharmacy? Yes  This is the patient's preferred pharmacy:  [facility] - [facility] [address], [address] [place] [address] Phone: [phone] Fax: [phone]   Is this the correct pharmacy for this prescription? Yes If no, delete pharmacy and type the correct one.   Has the prescription been filled recently? Yes  Is the patient out of the medication? Yes  Has the patient been seen for an appointment in the last year OR does the patient have an upcoming appointment? Yes  Can we respond through MyChart? Yes  Agent: Please be advised that Rx refills may take up to 3 business days. We ask that you follow-up with your pharmacy.

## 2024-01-28 NOTE — Telephone Encounter (Signed)
 Requested Prescriptions  Refused Prescriptions Disp Refills   Continuous Glucose Receiver (FREESTYLE LIBRE 3 READER) DEVI 1 each 0    Sig: Use as directed  every 14 (fourteen) days.     Endocrinology: Diabetes - Testing Supplies Passed - 01/28/2024 12:04 PM      Passed - Valid encounter within last 12 months    Recent Outpatient Visits           1 month ago Type 2 diabetes mellitus with hyperglycemia, unspecified whether long term insulin  use (HCC)   [facility] [place]. [facility] [place], [place], [place]   7 months ago Acute cystitis with hematuria   [facility] [facility] - [facility] Dept Of [place]. [facility] Vernell Goldsmith, MD   8 months ago Hypertension associated with diabetes [facility])   [facility] [facility] - A Dept Of [place]. [facility] Joaquin Mulberry, MD   9 months ago Type 2 diabetes mellitus with diabetic polyneuropathy, with long-term current use of insulin  [facility])   [place] [facility] - A Dept Of [place]. [facility] Joaquin Mulberry, MD   1 year ago Type 2 diabetes mellitus with diabetic polyneuropathy, with long-term current use of insulin  [facility])    [facility] Dept Of [place]. [facility], [facility] Joaquin Mulberry, MD

## 2024-02-02 ENCOUNTER — Ambulatory Visit: Admitting: Family Medicine

## 2024-02-02 ENCOUNTER — Ambulatory Visit: Admitting: Pharmacist

## 2024-02-04 ENCOUNTER — Telehealth: Payer: Self-pay | Admitting: Family Medicine

## 2024-02-04 ENCOUNTER — Ambulatory Visit: Admitting: Pharmacist

## 2024-02-04 ENCOUNTER — Other Ambulatory Visit: Payer: Self-pay | Admitting: Family Medicine

## 2024-02-04 ENCOUNTER — Other Ambulatory Visit: Payer: Self-pay

## 2024-02-04 MED ORDER — LANTUS SOLOSTAR 100 UNIT/ML ~~LOC~~ SOPN
27.0000 [IU] | PEN_INJECTOR | Freq: Every day | SUBCUTANEOUS | 1 refills | Status: DC
  Filled 2024-02-04: qty 24, 88d supply, fill #0

## 2024-02-04 NOTE — Telephone Encounter (Signed)
 Duplicate request, please see RPH refill encounter today.

## 2024-02-04 NOTE — Telephone Encounter (Signed)
 Copied from CRM [id number]. Topic: Clinical - Medication Refill >> Feb 04, 2024  4:48 PM [place] B wrote: Medication: insulin  glargine (LANTUS  SOLOSTAR) 100 UNIT/ML Solostar Pen/insulin  lispro (HUMALOG  KWIKPEN) 100 UNIT/ML KwikPen  Has the patient contacted their pharmacy? Yes but these meds werent requested at first from the pharmacy   This is the patient's preferred pharmacy:  [facility] - [facility] [address] [place] [address] Phone: [phone] Fax: [phone]       Is this the correct pharmacy for this prescription? yes .   Has the prescription been filled recently? no  Is the patient out of the medication? No has 2 days left  Has the patient been seen for an appointment in the last year OR does the patient have an upcoming appointment? yes  Can we respond through MyChart? yes  Agent: Please be advised that Rx refills may take up to 3 business days. We ask that you follow-up with your pharmacy.

## 2024-02-05 ENCOUNTER — Ambulatory Visit: Payer: Self-pay

## 2024-02-05 ENCOUNTER — Encounter: Payer: Self-pay | Admitting: *Deleted

## 2024-02-05 ENCOUNTER — Other Ambulatory Visit: Payer: Self-pay

## 2024-02-05 NOTE — Telephone Encounter (Signed)
 Pt states he was given amoxicillin  for foot infection but has not cleared up and would like a refill. Pt does not want to come in for visit. He was last seen on 12/03/2023. Please call pt at [phone].

## 2024-02-05 NOTE — Telephone Encounter (Signed)
 Spoke to Mr. Mathe and advised he would need an OV for evaluation to get ATB prescription.  Advised Mr. Trompeter of the importance to see a healthcare provider for foot infection.   He understands he has DM and states he does not wish to be cut on and he was not going to go to the MD. He denies SI. He was very adamant about not wanting to be cut on. He only wants an ATB. He stated he has had multiple procedures already d/t DM.   Encouraged patient that it would be in his best interest to see a medical provider. Given options to walk in on [facility] on Monday and advised about the [facility] operations on Monday. He preferred to go to [facility].

## 2024-02-05 NOTE — Telephone Encounter (Signed)
 Triage Disposition: No disposition - Medication requested - Amoxicillin   Taylor Hughes does not want to come in at this time.  Patient/caregiver understands and will follow disposition?: FYI Only or Action Required?: Action required by provider: medication refill request.  Patient was last seen in primary care on 12/03/2023 by Taylor Jon HERO, PA-C. Called Nurse Triage reporting No chief complaint on file.. Symptoms foot is warm and swollen.  ongoing foot infection. Interventions attempted: Nothing. Symptoms are: staying off his feet gradually worsening.  Triage Disposition: No disposition on file.  Patient/caregiver understands and will follow disposition?:                    Taylor Hughes states he was given amoxicillin  for foot infection but has not cleared up and would like a refill. Taylor Hughes does not want to come in for visit. He was last seen on 12/03/2023. Please call Taylor Hughes at [phone].  Answer Assessment - Initial Assessment Questions 1. LOCATION: Where is the wound located?      Left foot 2. WOUND APPEARANCE: What does the wound look like?      A little swollen a little warm 3. SIZE: If redness is present, ask: What is the size of the red area? (Inches, centimeters, or compare to size of a coin)      Does not see a color change 4. SPREAD: What's changed in the last day?  Do you see any red streaks coming from the wound?      5. ONSET: When did it start to look infected?      ongoing 6. MECHANISM: How did the wound start, what was the cause?      7. PAIN: Do you have any pain?  If Yes, ask: How bad is the pain?  (e.g., Scale 1-10; mild, moderate, or severe)    - MILD (1-3): Doesn't interfere with normal activities.     - MODERATE (4-7): Interferes with normal activities or awakens from sleep.    - SEVERE (8-10): Excruciating pain, unable to do any normal activities.        8. FEVER: Do you have a fever? If Yes, ask: What is your temperature, how was it measured, and  when did it start?     no 9. OTHER SYMPTOMS: Do you have any other symptoms? (e.g., shaking chills, weakness, rash elsewhere on body)     no  Protocols used: Wound Infection Suspected-A-AH

## 2024-02-08 ENCOUNTER — Other Ambulatory Visit: Payer: Self-pay | Admitting: Family Medicine

## 2024-02-08 NOTE — Telephone Encounter (Unsigned)
 Copied from CRM [id number]. Topic: Clinical - Medication Refill >> Feb 08, 2024  5:02 PM Turkey B wrote: Medication: Accu-Chek Softclix Lancets lancets  Has the patient contacted their pharmacy? yes (Agent: If yes, when and what did the pharmacy advise?)pt couldn't get thru to pharmacy  This is the patient's preferred pharmacy:  [facility] [address] [place] [address] Phone: [phone] Fax: [phone]      Is this the correct pharmacy for this prescription? yes  Has the prescription been filled recently? no  Is the patient out of the medication? yes  Has the patient been seen for an appointment in the last year OR does the patient have an upcoming appointment? yes  Can we respond through MyChart? yes  Agent: Please be advised that Rx refills may take up to 3 business days. We ask that you follow-up with your pharmacy.

## 2024-02-09 ENCOUNTER — Other Ambulatory Visit: Payer: Self-pay

## 2024-02-09 MED ORDER — ACCU-CHEK SOFTCLIX LANCETS MISC
6 refills | Status: DC
  Filled 2024-02-09: qty 100, fill #0

## 2024-02-17 ENCOUNTER — Other Ambulatory Visit: Payer: Self-pay

## 2024-02-20 DIAGNOSIS — Z419 Encounter for procedure for purposes other than remedying health state, unspecified: Secondary | ICD-10-CM | POA: Diagnosis not present

## 2024-02-21 ENCOUNTER — Emergency Department (HOSPITAL_COMMUNITY)

## 2024-02-21 ENCOUNTER — Inpatient Hospital Stay (HOSPITAL_COMMUNITY)

## 2024-02-21 ENCOUNTER — Other Ambulatory Visit: Payer: Self-pay

## 2024-02-21 ENCOUNTER — Inpatient Hospital Stay (HOSPITAL_COMMUNITY)
Admission: EM | Admit: 2024-02-21 | Discharge: 2024-03-04 | DRG: 853 | Disposition: A | Discharge status: Skilled Nursing Facility | Attending: Internal Medicine | Admitting: Internal Medicine

## 2024-02-21 DIAGNOSIS — K2210 Ulcer of esophagus without bleeding: Secondary | ICD-10-CM | POA: Diagnosis present

## 2024-02-21 DIAGNOSIS — E860 Dehydration: Secondary | ICD-10-CM | POA: Diagnosis present

## 2024-02-21 DIAGNOSIS — I480 Paroxysmal atrial fibrillation: Secondary | ICD-10-CM | POA: Diagnosis present

## 2024-02-21 DIAGNOSIS — E875 Hyperkalemia: Secondary | ICD-10-CM | POA: Diagnosis not present

## 2024-02-21 DIAGNOSIS — E1110 Type 2 diabetes mellitus with ketoacidosis without coma: Secondary | ICD-10-CM | POA: Diagnosis present

## 2024-02-21 DIAGNOSIS — I4891 Unspecified atrial fibrillation: Secondary | ICD-10-CM

## 2024-02-21 DIAGNOSIS — I482 Chronic atrial fibrillation, unspecified: Secondary | ICD-10-CM | POA: Diagnosis not present

## 2024-02-21 DIAGNOSIS — M869 Osteomyelitis, unspecified: Principal | ICD-10-CM

## 2024-02-21 DIAGNOSIS — R6520 Severe sepsis without septic shock: Secondary | ICD-10-CM | POA: Diagnosis present

## 2024-02-21 DIAGNOSIS — Z79899 Other long term (current) drug therapy: Secondary | ICD-10-CM

## 2024-02-21 DIAGNOSIS — Z800 Family history of malignant neoplasm of digestive organs: Secondary | ICD-10-CM

## 2024-02-21 DIAGNOSIS — A401 Sepsis due to streptococcus, group B: Principal | ICD-10-CM | POA: Diagnosis present

## 2024-02-21 DIAGNOSIS — E1152 Type 2 diabetes mellitus with diabetic peripheral angiopathy with gangrene: Secondary | ICD-10-CM | POA: Diagnosis not present

## 2024-02-21 DIAGNOSIS — M7989 Other specified soft tissue disorders: Secondary | ICD-10-CM | POA: Diagnosis not present

## 2024-02-21 DIAGNOSIS — R404 Transient alteration of awareness: Secondary | ICD-10-CM | POA: Diagnosis not present

## 2024-02-21 DIAGNOSIS — K2960 Other gastritis without bleeding: Secondary | ICD-10-CM | POA: Diagnosis present

## 2024-02-21 DIAGNOSIS — T182XXA Foreign body in stomach, initial encounter: Secondary | ICD-10-CM | POA: Diagnosis not present

## 2024-02-21 DIAGNOSIS — N179 Acute kidney failure, unspecified: Secondary | ICD-10-CM | POA: Diagnosis present

## 2024-02-21 DIAGNOSIS — I38 Endocarditis, valve unspecified: Secondary | ICD-10-CM | POA: Diagnosis not present

## 2024-02-21 DIAGNOSIS — M86272 Subacute osteomyelitis, left ankle and foot: Secondary | ICD-10-CM | POA: Diagnosis not present

## 2024-02-21 DIAGNOSIS — Z5941 Food insecurity: Secondary | ICD-10-CM

## 2024-02-21 DIAGNOSIS — D638 Anemia in other chronic diseases classified elsewhere: Secondary | ICD-10-CM | POA: Diagnosis present

## 2024-02-21 DIAGNOSIS — K3184 Gastroparesis: Secondary | ICD-10-CM | POA: Diagnosis present

## 2024-02-21 DIAGNOSIS — L02612 Cutaneous abscess of left foot: Secondary | ICD-10-CM | POA: Diagnosis present

## 2024-02-21 DIAGNOSIS — E11621 Type 2 diabetes mellitus with foot ulcer: Secondary | ICD-10-CM | POA: Diagnosis present

## 2024-02-21 DIAGNOSIS — K2980 Duodenitis without bleeding: Secondary | ICD-10-CM | POA: Diagnosis not present

## 2024-02-21 DIAGNOSIS — E43 Unspecified severe protein-calorie malnutrition: Secondary | ICD-10-CM | POA: Diagnosis not present

## 2024-02-21 DIAGNOSIS — Z1152 Encounter for screening for COVID-19: Secondary | ICD-10-CM | POA: Diagnosis not present

## 2024-02-21 DIAGNOSIS — L97429 Non-pressure chronic ulcer of left heel and midfoot with unspecified severity: Secondary | ICD-10-CM | POA: Diagnosis present

## 2024-02-21 DIAGNOSIS — L089 Local infection of the skin and subcutaneous tissue, unspecified: Secondary | ICD-10-CM | POA: Diagnosis not present

## 2024-02-21 DIAGNOSIS — G9341 Metabolic encephalopathy: Secondary | ICD-10-CM | POA: Diagnosis present

## 2024-02-21 DIAGNOSIS — K208 Other esophagitis without bleeding: Secondary | ICD-10-CM | POA: Diagnosis not present

## 2024-02-21 DIAGNOSIS — K920 Hematemesis: Secondary | ICD-10-CM | POA: Diagnosis present

## 2024-02-21 DIAGNOSIS — R58 Hemorrhage, not elsewhere classified: Secondary | ICD-10-CM | POA: Diagnosis not present

## 2024-02-21 DIAGNOSIS — Z555 Less than a high school diploma: Secondary | ICD-10-CM

## 2024-02-21 DIAGNOSIS — E1143 Type 2 diabetes mellitus with diabetic autonomic (poly)neuropathy: Secondary | ICD-10-CM | POA: Diagnosis present

## 2024-02-21 DIAGNOSIS — Z794 Long term (current) use of insulin: Secondary | ICD-10-CM

## 2024-02-21 DIAGNOSIS — G9340 Encephalopathy, unspecified: Secondary | ICD-10-CM

## 2024-02-21 DIAGNOSIS — R197 Diarrhea, unspecified: Secondary | ICD-10-CM | POA: Diagnosis present

## 2024-02-21 DIAGNOSIS — R402431 Glasgow coma scale score 3-8, in the field [EMT or ambulance]: Secondary | ICD-10-CM | POA: Diagnosis not present

## 2024-02-21 DIAGNOSIS — K219 Gastro-esophageal reflux disease without esophagitis: Secondary | ICD-10-CM | POA: Diagnosis present

## 2024-02-21 DIAGNOSIS — D509 Iron deficiency anemia, unspecified: Secondary | ICD-10-CM | POA: Diagnosis not present

## 2024-02-21 DIAGNOSIS — Z5986 Financial insecurity: Secondary | ICD-10-CM

## 2024-02-21 DIAGNOSIS — I890 Lymphedema, not elsewhere classified: Secondary | ICD-10-CM | POA: Diagnosis present

## 2024-02-21 DIAGNOSIS — R739 Hyperglycemia, unspecified: Secondary | ICD-10-CM | POA: Diagnosis not present

## 2024-02-21 DIAGNOSIS — Z833 Family history of diabetes mellitus: Secondary | ICD-10-CM

## 2024-02-21 DIAGNOSIS — D649 Anemia, unspecified: Secondary | ICD-10-CM | POA: Diagnosis present

## 2024-02-21 DIAGNOSIS — E876 Hypokalemia: Secondary | ICD-10-CM | POA: Diagnosis present

## 2024-02-21 DIAGNOSIS — R55 Syncope and collapse: Secondary | ICD-10-CM | POA: Diagnosis not present

## 2024-02-21 DIAGNOSIS — Z6841 Body Mass Index (BMI) 40.0 and over, adult: Secondary | ICD-10-CM

## 2024-02-21 DIAGNOSIS — E1169 Type 2 diabetes mellitus with other specified complication: Secondary | ICD-10-CM | POA: Diagnosis present

## 2024-02-21 DIAGNOSIS — E11610 Type 2 diabetes mellitus with diabetic neuropathic arthropathy: Secondary | ICD-10-CM | POA: Diagnosis not present

## 2024-02-21 DIAGNOSIS — R7881 Bacteremia: Secondary | ICD-10-CM | POA: Diagnosis not present

## 2024-02-21 DIAGNOSIS — M868X7 Other osteomyelitis, ankle and foot: Secondary | ICD-10-CM | POA: Diagnosis not present

## 2024-02-21 DIAGNOSIS — L97529 Non-pressure chronic ulcer of other part of left foot with unspecified severity: Secondary | ICD-10-CM | POA: Diagnosis not present

## 2024-02-21 DIAGNOSIS — E119 Type 2 diabetes mellitus without complications: Secondary | ICD-10-CM | POA: Diagnosis not present

## 2024-02-21 DIAGNOSIS — Z89431 Acquired absence of right foot: Secondary | ICD-10-CM

## 2024-02-21 DIAGNOSIS — Z89512 Acquired absence of left leg below knee: Secondary | ICD-10-CM | POA: Diagnosis not present

## 2024-02-21 DIAGNOSIS — M86172 Other acute osteomyelitis, left ankle and foot: Secondary | ICD-10-CM | POA: Diagnosis not present

## 2024-02-21 DIAGNOSIS — R918 Other nonspecific abnormal finding of lung field: Secondary | ICD-10-CM | POA: Diagnosis not present

## 2024-02-21 DIAGNOSIS — I517 Cardiomegaly: Secondary | ICD-10-CM | POA: Diagnosis not present

## 2024-02-21 DIAGNOSIS — G546 Phantom limb syndrome with pain: Secondary | ICD-10-CM | POA: Diagnosis not present

## 2024-02-21 DIAGNOSIS — R519 Headache, unspecified: Secondary | ICD-10-CM | POA: Diagnosis not present

## 2024-02-21 DIAGNOSIS — K3189 Other diseases of stomach and duodenum: Secondary | ICD-10-CM | POA: Diagnosis present

## 2024-02-21 DIAGNOSIS — B951 Streptococcus, group B, as the cause of diseases classified elsewhere: Secondary | ICD-10-CM | POA: Diagnosis not present

## 2024-02-21 DIAGNOSIS — I10 Essential (primary) hypertension: Secondary | ICD-10-CM | POA: Diagnosis present

## 2024-02-21 DIAGNOSIS — E1310 Other specified diabetes mellitus with ketoacidosis without coma: Secondary | ICD-10-CM

## 2024-02-21 DIAGNOSIS — E785 Hyperlipidemia, unspecified: Secondary | ICD-10-CM | POA: Diagnosis present

## 2024-02-21 DIAGNOSIS — M14672 Charcot's joint, left ankle and foot: Secondary | ICD-10-CM | POA: Diagnosis not present

## 2024-02-21 LAB — CBC WITH DIFFERENTIAL/PLATELET
Abs Immature Granulocytes: 0.39 10*3/uL — ABNORMAL HIGH (ref 0.00–0.07)
Basophils Absolute: 0.1 10*3/uL (ref 0.0–0.1)
Basophils Relative: 0 %
Eosinophils Absolute: 0 10*3/uL (ref 0.0–0.5)
Eosinophils Relative: 0 %
HCT: 36.4 % — ABNORMAL LOW (ref 39.0–52.0)
Hemoglobin: 9.6 g/dL — ABNORMAL LOW (ref 13.0–17.0)
Immature Granulocytes: 2 %
Lymphocytes Relative: 3 %
Lymphs Abs: 0.7 10*3/uL (ref 0.7–4.0)
MCH: 27.4 pg (ref 26.0–34.0)
MCHC: 26.4 g/dL — ABNORMAL LOW (ref 30.0–36.0)
MCV: 103.7 fL — ABNORMAL HIGH (ref 80.0–100.0)
Monocytes Absolute: 0.3 10*3/uL (ref 0.1–1.0)
Monocytes Relative: 1 %
Neutro Abs: 24.5 10*3/uL — ABNORMAL HIGH (ref 1.7–7.7)
Neutrophils Relative %: 94 %
Platelets: 487 10*3/uL — ABNORMAL HIGH (ref 150–400)
RBC: 3.51 MIL/uL — ABNORMAL LOW (ref 4.22–5.81)
RDW: 19.1 % — ABNORMAL HIGH (ref 11.5–15.5)
WBC: 25.9 10*3/uL — ABNORMAL HIGH (ref 4.0–10.5)
nRBC: 0.1 % (ref 0.0–0.2)

## 2024-02-21 LAB — URINALYSIS, W/ REFLEX TO CULTURE (INFECTION SUSPECTED)
Bilirubin Urine: NEGATIVE
Glucose, UA: >=500 mg/dL — AB
Ketones, ur: 20 mg/dL — AB
Leukocytes,Ua: NEGATIVE
Nitrite: NEGATIVE
Protein, ur: 100 mg/dL — AB
Specific Gravity, Urine: 1.017 (ref 1.005–1.030)
pH: 5 (ref 5.0–8.0)

## 2024-02-21 LAB — COMPREHENSIVE METABOLIC PANEL WITH GFR
ALT: 12 U/L (ref 0–44)
AST: 16 U/L (ref 15–41)
Albumin: 2 g/dL — ABNORMAL LOW (ref 3.5–5.0)
Alkaline Phosphatase: 112 U/L (ref 38–126)
BUN: 47 mg/dL — ABNORMAL HIGH (ref 6–20)
CO2: <7 mmol/L — ABNORMAL LOW (ref 22–32)
Calcium: 9.3 mg/dL (ref 8.9–10.3)
Chloride: 100 mmol/L (ref 98–111)
Creatinine, Ser: 3.29 mg/dL — ABNORMAL HIGH (ref 0.61–1.24)
GFR, Estimated: 22 mL/min — ABNORMAL LOW (ref >60)
Glucose, Bld: 1076 mg/dL (ref 70–99)
Potassium: 6.9 mmol/L (ref 3.5–5.1)
Sodium: 134 mmol/L — ABNORMAL LOW (ref 135–145)
Total Bilirubin: 2.6 mg/dL — ABNORMAL HIGH (ref 0.0–1.2)
Total Protein: 8.6 g/dL — ABNORMAL HIGH (ref 6.5–8.1)

## 2024-02-21 LAB — I-STAT VENOUS BLOOD GAS, ED
Acid-base deficit: 28 mmol/L — ABNORMAL HIGH (ref 0.0–2.0)
Bicarbonate: 3.6 mmol/L — ABNORMAL LOW (ref 20.0–28.0)
Calcium, Ion: 1.22 mmol/L (ref 1.15–1.40)
HCT: 35 % — ABNORMAL LOW (ref 39.0–52.0)
Hemoglobin: 11.9 g/dL — ABNORMAL LOW (ref 13.0–17.0)
O2 Saturation: 80 %
Potassium: 7.5 mmol/L (ref 3.5–5.1)
Sodium: 134 mmol/L — ABNORMAL LOW (ref 135–145)
TCO2: <5 mmol/L — ABNORMAL LOW (ref 22–32)
pCO2, Ven: 19.1 mmHg — CL (ref 44–60)
pH, Ven: 6.883 — CL (ref 7.25–7.43)
pO2, Ven: 73 mmHg — ABNORMAL HIGH (ref 32–45)

## 2024-02-21 LAB — PROTIME-INR
INR: 1.6 — ABNORMAL HIGH (ref 0.8–1.2)
Prothrombin Time: 20.1 s — ABNORMAL HIGH (ref 11.4–15.2)

## 2024-02-21 LAB — RESP PANEL BY RT-PCR (RSV, FLU A&B, COVID)  RVPGX2
Influenza A by PCR: NEGATIVE
Influenza B by PCR: NEGATIVE
Resp Syncytial Virus by PCR: NEGATIVE
SARS Coronavirus 2 by RT PCR: NEGATIVE

## 2024-02-21 LAB — CBG MONITORING, ED
Glucose-Capillary: >600 mg/dL (ref 70–99)
Glucose-Capillary: >600 mg/dL (ref 70–99)

## 2024-02-21 LAB — I-STAT CG4 LACTIC ACID, ED: Lactic Acid, Venous: 2.5 mmol/L (ref 0.5–1.9)

## 2024-02-21 MED ORDER — LACTATED RINGERS IV SOLN: INTRAVENOUS | Status: AC

## 2024-02-21 MED ORDER — CHLORHEXIDINE GLUCONATE CLOTH 2 % EX PADS
6.0000 | MEDICATED_PAD | Freq: Every day | CUTANEOUS | Status: DC
  Administered 2024-02-22 – 2024-03-04 (×11): 6 via TOPICAL

## 2024-02-21 MED ORDER — VANCOMYCIN HCL IN DEXTROSE 1-5 GM/200ML-% IV SOLN: 1000.0000 mg | Freq: Once | INTRAVENOUS | Status: DC

## 2024-02-21 MED ORDER — VANCOMYCIN HCL 2000 MG/400ML IV SOLN
2000.0000 mg | Freq: Once | INTRAVENOUS | Status: AC
  Administered 2024-02-21: 2000 mg via INTRAVENOUS
  Filled 2024-02-21: qty 400

## 2024-02-21 MED ORDER — HEPARIN SODIUM (PORCINE) 5000 UNIT/ML IJ SOLN
5000.0000 [IU] | Freq: Three times a day (TID) | INTRAMUSCULAR | Status: DC
  Administered 2024-02-22 (×2): 5000 [IU] via SUBCUTANEOUS
  Filled 2024-02-21 (×2): qty 1

## 2024-02-21 MED ORDER — INSULIN REGULAR(HUMAN) IN NACL 100-0.9 UT/100ML-% IV SOLN
INTRAVENOUS | Status: DC
  Administered 2024-02-21 – 2024-02-22 (×2): 8 [IU]/h via INTRAVENOUS
  Administered 2024-02-22: 21 [IU]/h via INTRAVENOUS
  Administered 2024-02-23: 5 [IU]/h via INTRAVENOUS
  Filled 2024-02-21 (×4): qty 100

## 2024-02-21 MED ORDER — DEXTROSE 50 % IV SOLN: 0.0000 mL | INTRAVENOUS | Status: DC | PRN

## 2024-02-21 MED ORDER — LACTATED RINGERS IV BOLUS (SEPSIS)
1000.0000 mL | Freq: Once | INTRAVENOUS | Status: AC
  Administered 2024-02-21: 1000 mL via INTRAVENOUS

## 2024-02-21 MED ORDER — SODIUM CHLORIDE 0.9 % IV SOLN
2.0000 g | Freq: Once | INTRAVENOUS | Status: AC
  Administered 2024-02-21: 2 g via INTRAVENOUS
  Filled 2024-02-21: qty 12.5

## 2024-02-21 MED ORDER — METRONIDAZOLE 500 MG/100ML IV SOLN
500.0000 mg | Freq: Once | INTRAVENOUS | Status: AC
  Administered 2024-02-21: 500 mg via INTRAVENOUS
  Filled 2024-02-21: qty 100

## 2024-02-21 MED ORDER — SODIUM BICARBONATE 8.4 % IV SOLN
50.0000 meq | Freq: Once | INTRAVENOUS | Status: AC
  Administered 2024-02-21: 50 meq via INTRAVENOUS
  Filled 2024-02-21: qty 50

## 2024-02-21 MED ORDER — DOCUSATE SODIUM 100 MG PO CAPS
100.0000 mg | ORAL_CAPSULE | Freq: Two times a day (BID) | ORAL | Status: AC | PRN
Start: 2024-02-21 — End: ?

## 2024-02-21 MED ORDER — POLYETHYLENE GLYCOL 3350 17 G PO PACK: 17.0000 g | PACK | Freq: Every day | ORAL | Status: DC | PRN

## 2024-02-21 MED ORDER — DEXTROSE IN LACTATED RINGERS 5 % IV SOLN: INTRAVENOUS | Status: AC

## 2024-02-21 NOTE — ED Notes (Signed)
 Multiple RN's attempting IV placement without success. Unable to obtain blood.

## 2024-02-21 NOTE — H&P (Incomplete)
 NAME:  Taylor Hughes, MRN:  [id number], DOB:  Jun 25, 1971, LOS: 0 ADMISSION DATE:  02/21/2024, CONSULTATION DATE:  02/21/24 REFERRING MD:  EDP, CHIEF COMPLAINT:  AMS   History of Present Illness:  Taylor Hughes is a 53 y.o. M with PMH significant for Atrial Fibrillation, Type 2 DM and prior admissions for DKA, gastroparesis, obesity who presented to the ED with altered mental status and hyperglycemia.  He was last hospitalized for DKA in 11/2023 and was treated for L foot infection as well.   Work-up in the ED consistent with DKA with initial glucose >1000, K 6.9, bicarb <7, creatinine 3.2 and pH of 6.8.   The L foot wound remains present.  He was given 3L of LR, an amp of bicarb, Vanc/cefepime /Flagyl , EKG without  peaked T waves or prolonged qtc.  His mental status remained altered, therefore PCCM consulted for admission  Pertinent  Medical History   has a past medical history of Atrial fibrillation (HCC), Diabetes mellitus without complication (HCC), GERD (gastroesophageal reflux disease), Hyperlipidemia, Hypertension, Morbid (severe) obesity due to excess calories (HCC) (08/07/2023), Wears glasses, and Wound, open, foot.   Significant Hospital Events: Including procedures, antibiotic start and stop dates in addition to other pertinent events   7/13 admit with DKA and encephalopathy  Interim History / Subjective:  As above  Objective    Blood pressure (!) 112/101, pulse 86, temperature 98 F (36.7 C), resp. rate (!) 21, height 6' (1.829 m), weight (!) 156 kg, SpO2 98%.       No intake or output data in the 24 hours ending 02/21/24 2336 Filed Weights   02/21/24 2103  Weight: (!) 156 kg   General:  obese M, ill-appearing, resting in bed in NAD HEENT: MM pink/moist Neuro: eyes open and protecting his airway, spontaneously moving but not following commands and not answering questions CV: s1s2 rrr, no m/r/g PULM:  kussmaul breathing, maintaining sats and protecting airway on Pett[place], no rhonchi  or wheezing GI: soft, bsx4 active  Extremities: warm/dry, chronic lymphedema, partial amputation of the R foot, diabetic foot wound of the L plantar aspect without purulent drainage or erythema/induration    Resolved problem list   Assessment and Plan    Type 2 DM with acute DKA  L diabetic foot wound Hyperkalemia Received 3L IVF and 1 amp bicarb -admit to ICU, continue endotool and IVF -follow K on next BMP -repeat VBG -possible L foot infection though this is chronic and he was treated with inpatient and outpatient abx after his last admission, may need MRI when more stable -continue Vanc/cefepime  and follow Bcx2 -trend BHOB  AKI -likely secondary to volume depletion -follow renal indices after IVF, monitor UOP -avoid nephrotoxins   Acute Encephalopathy -likely secondary to DKA but head CT is pending   Atrial Fibrillation -currently in sinus, hold home bb for now in the setting of  Best Practice (right click and Reselect all SmartList Selections daily)   Diet/type: {diet type:25684} DVT prophylaxis {anticoagulation:25687} Pressure ulcer(s): {pressure ulcer(s):31683} GI prophylaxis: {HP:73065} Lines: {Central Venous Access:25771} Foley:  {Central Venous Access:25691} Code Status:  {Code Status:26939} Last date of multidisciplinary goals of care discussion [***]  Labs   CBC: Recent Labs  Lab 02/21/24 2130 02/21/24 2134  WBC 25.9*  --   NEUTROABS 24.5*  --   HGB 9.6* 11.9*  HCT 36.4* 35.0*  MCV 103.7*  --   PLT 487*  --     Basic Metabolic Panel: Recent Labs  Lab 02/21/24 2130 02/21/24  2134  NA 134* 134*  K 6.9* 7.5*  CL 100  --   CO2 <7*  --   GLUCOSE 1,076*  --   BUN 47*  --   CREATININE 3.29*  --   CALCIUM  9.3  --    GFR: Estimated Creatinine Clearance: 40.5 mL/min (A) (by C-G formula based on SCr of 3.29 mg/dL (H)). Recent Labs  Lab 02/21/24 2130 02/21/24 2134  WBC 25.9*  --   LATICACIDVEN  --  2.5*    Liver Function  Tests: Recent Labs  Lab 02/21/24 2130  AST 16  ALT 12  ALKPHOS 112  BILITOT 2.6*  PROT 8.6*  ALBUMIN  2.0*   No results for input(s): LIPASE, AMYLASE in the last 168 hours. No results for input(s): AMMONIA in the last 168 hours.  ABG    Component Value Date/Time   HCO3 3.6 (L) 02/21/2024 2134   TCO2 <5 (L) 02/21/2024 2134   ACIDBASEDEF 28.0 (H) 02/21/2024 2134   O2SAT 80 02/21/2024 2134     Coagulation Profile: Recent Labs  Lab 02/21/24 2130  INR 1.6*    Cardiac Enzymes: No results for input(s): CKTOTAL, CKMB, CKMBINDEX, TROPONINI in the last 168 hours.  HbA1C: HbA1c, POC (controlled diabetic range)  Date/Time Value Ref Range Status  06/25/2023 03:29 PM 7.9 (A) 0.0 - 7.0 % Final  01/19/2023 11:26 AM 9.7 (A) 0.0 - 7.0 % Final   Hgb A1c MFr Bld  Date/Time Value Ref Range Status  11/11/2023 03:19 AM 9.1 (H) 4.8 - 5.6 % Final    Comment:    (NOTE) Pre diabetes:          5.7%-6.4%  Diabetes:              >6.4%  Glycemic control for   <7.0% adults with diabetes   03/23/2023 12:05 AM 9.8 (H) 4.8 - 5.6 % Final    Comment:    (NOTE)         Prediabetes: 5.7 - 6.4         Diabetes: >6.4         Glycemic control for adults with diabetes: <7.0     CBG: Recent Labs  Lab 02/21/24 2109 02/21/24 2320  GLUCAP >600* >600*    Review of Systems:   ***  Past Medical History:  He,  has a past medical history of Atrial fibrillation (HCC), Diabetes mellitus without complication (HCC), GERD (gastroesophageal reflux disease), Hyperlipidemia, Hypertension, Morbid (severe) obesity due to excess calories (HCC) (08/07/2023), Wears glasses, and Wound, open, foot.   Surgical History:   Past Surgical History:  Procedure Laterality Date  . AMPUTATION Left 08/20/2018   Procedure: LEFT FOOT IRRIGATON AND DEBRIDEMENT, 5TH RAY AMPUTATION;  Surgeon: Harden Jerona GAILS, MD;  Location: [facility] OR;  Service: Orthopedics;  Laterality: Left;  . AMPUTATION Right 03/25/2023    Procedure: RIGHT TRANSMETATARSAL AMPUTATION;  Surgeon: Harden Jerona GAILS, MD;  Location: [facility] OR;  Service: Orthopedics;  Laterality: Right;  . APPLICATION OF A-CELL OF CHEST/ABDOMEN N/A 05/30/2019   Procedure: Application Of A-Cell Of Chest;  Surgeon: Shyrl Linnie KIDD, MD;  Location: [facility] OR;  Service: Cardiothoracic;  Laterality: N/A;  . APPLICATION OF WOUND VAC N/A 05/30/2019   Procedure: Application Of Wound Vac;  Surgeon: Shyrl Linnie KIDD, MD;  Location: [facility] OR;  Service: Cardiothoracic;  Laterality: N/A;  . FINGER SURGERY Right 2016   I&D  small finger  . I & D EXTREMITY Left 07/30/2018   Procedure: IRRIGATION AND DEBRIDEMENT LEFT FOOT  WITH POSSIBLE AMPUTATION OF FIFTH TOE;  Surgeon: Vernetta Lonni GRADE, MD;  Location: [facility] ORS;  Service: Orthopedics;  Laterality: Left;  . INCISION AND DRAINAGE ABSCESS Left 01/19/2019   Procedure: INCISION AND DRAINAGE ABSCESS upper chest;  Surgeon: Carlie Clark, MD;  Location: [facility] OR;  Service: ENT;  Laterality: Left;  . IRRIGATION AND DEBRIDEMENT STERNOCLAVICULAR JOINT-STERNUM AND RIBS N/A 05/30/2019   Procedure: IRRIGATION AND DEBRIDEMENT OF STERNOCLAVICULAR JOINT-STERNUM AND RIBS ;  Surgeon: Shyrl Linnie KIDD, MD;  Location: [facility] OR;  Service: Cardiothoracic;  Laterality: N/A;  . MINOR IRRIGATION AND DEBRIDEMENT OF WOUND N/A 11/22/2018   Procedure: INCISION AND DRAINAGE OF NECK ABSCESS;  Surgeon: Carlie Clark, MD;  Location: [facility] ORS;  Service: ENT;  Laterality: N/A;     Social History:   reports that he has never smoked. He has never used smokeless tobacco. He reports that he does not drink alcohol and does not use drugs.   Family History:  His family history includes Colon cancer in his brother; Diabetes in his mother. There is no history of Esophageal cancer, Rectal cancer, or Stomach cancer.   Allergies No Known Allergies   Home Medications  Prior to Admission medications   Medication Sig Start Date End Date Taking? Authorizing Provider   Accu-Chek Softclix Lancets lancets Use to check blood sugar 3 times daily. 02/09/24   Newlin, Enobong, MD  atorvastatin  (LIPITOR) 20 MG tablet Take 1 tablet (20 mg total) by mouth daily. 12/03/23   Danton Jon HERO, PA-C  Blood Glucose Monitoring Suppl (ACCU-CHEK GUIDE) w/Device KIT Use to check blood sugar 3 times daily. 06/02/23   Newlin, Enobong, MD  Continuous Glucose Receiver (FREESTYLE LIBRE 3 READER) DEVI Use as directed  every 14 (fourteen) days. Patient not taking: Reported on 12/17/2023 12/03/23   Danton Jon HERO, PA-C  Continuous Glucose Sensor (FREESTYLE LIBRE 3 SENSOR) MISC Use as directed .Place 1 sensor on the skin every 14 days. Use to check glucose continuously Patient not taking: Reported on 12/17/2023 12/03/23   Danton Jon HERO, PA-C  gabapentin  (NEURONTIN ) 300 MG capsule Take 1 capsule (300 mg total) by mouth 2 (two) times daily. 10/06/23   Newlin, Enobong, MD  glucose blood (ACCU-CHEK GUIDE TEST) test strip use as directed 3 (three) times daily. 06/02/23   Newlin, Enobong, MD  glucose blood (ACCU-CHEK GUIDE) test strip Use to check blood sugar 3 times daily. 06/02/23   Newlin, Enobong, MD  insulin  glargine (LANTUS  SOLOSTAR) 100 UNIT/ML Solostar Pen Inject 27 Units into the skin daily. 02/04/24   Newlin, Enobong, MD  insulin  lispro (HUMALOG  KWIKPEN) 100 UNIT/ML KwikPen Inject 0-12 units into the skin three times daily with meals. Per sliding scale. 01/22/24   Newlin, Enobong, MD  Insulin  Pen Needle (TECHLITE PEN NEEDLES) 32G X 6 MM MISC USE AS DIRECTED 01/22/24   Newlin, Enobong, MD  Menthol , Topical Analgesic, (ICY HOT BACK) 5 % PTCH Apply 1 patch topically daily. Patient not taking: Reported on 12/17/2023 11/25/23   Newlin, Enobong, MD  metoprolol  tartrate (LOPRESSOR ) 50 MG tablet Take 1 tablet (50 mg total) by mouth 2 (two) times daily. 01/22/24   Newlin, Enobong, MD  Misc. Devices MISC Blood pressure monitor.  Diagnosis-hypertension. 12/25/23   Newlin, Enobong, MD  naloxone  (NARCAN )  nasal spray 4 mg/0.1 mL USE 1 SPRAY ON 1 NOSTRIL ONCE, MAY REPEAT DOSE EVERY 2 TO 3 MINUTES UNTIL REPSONSIVE OR EMS ARRIVE FOR AN OPIOID EMERGENCY. Patient not taking: Reported on 12/17/2023 11/10/23     naloxone  (NARCAN )  nasal spray 4 mg/0.1 mL Place 1 spray into one nostril as needed for opioid emergency. May repeat every 2-3 min until responsive or EMS  arrives. 12/01/23     omeprazole  (PRILOSEC) 40 MG capsule Take 1 capsule (40 mg total) by mouth daily. 12/03/23   Danton Jon HERO, PA-C  sildenafil  (VIAGRA ) 100 MG tablet Take 1 tablet (100 mg total) by mouth daily as needed for erectile dysfunction. At least 24 hours between doses 01/22/24   Newlin, Enobong, MD  valsartan  (DIOVAN ) 160 MG tablet Take 1 tablet (160 mg total) by mouth daily. 12/03/23   Danton Jon HERO, PA-C  furosemide  (LASIX ) 20 MG tablet Take 2 tablets (40 mg total) by mouth daily. Patient not taking: Reported on 08/07/2023 05/14/23 11/04/23  Newlin, Enobong, MD     Critical care time: ***

## 2024-02-21 NOTE — ED Notes (Signed)
 Report given to Sidra, RN in the ICU. To be transported upstairs.

## 2024-02-21 NOTE — ED Notes (Signed)
 IV team at bedside. Pt continues to be responsive to pain only.

## 2024-02-21 NOTE — ED Notes (Signed)
 Provider made aware of the following criticals:  Glucose 1076 K: 6.9

## 2024-02-21 NOTE — ED Notes (Signed)
 Xray tech at bedside.

## 2024-02-21 NOTE — ED Notes (Signed)
 ICU providers at bedside

## 2024-02-21 NOTE — ED Notes (Signed)
 Only able to obtain one set of blood cultures. Provider aware. Will start ABX.

## 2024-02-21 NOTE — H&P (Addendum)
 NAME:  Taylor Hughes, MRN:  [id number], DOB:  [birth year], LOS: 0 ADMISSION DATE:  02/21/2024, CONSULTATION DATE:  02/21/24 REFERRING MD:  EDP, CHIEF COMPLAINT:  AMS   History of Present Illness:  Taylor Hughes is a 53 y.o. M with PMH significant for Atrial Fibrillation, Type 2 DM and prior admissions for DKA, gastroparesis, obesity who presented to the ED with altered mental status and hyperglycemia.  He was last hospitalized for DKA in 11/2023 and was treated for L foot infection as well.   Work-up in the ED consistent with DKA with initial glucose >1000, K 6.9, bicarb <7, creatinine 3.2 and pH of 6.8.   The L foot wound remains present.  He was given 3L of LR, an amp of bicarb, Vanc/cefepime /Flagyl , EKG without  peaked T waves or prolonged qtc.  His mental status remained altered, therefore PCCM consulted for admission  Pertinent  Medical History   has a past medical history of Atrial fibrillation (HCC), Diabetes mellitus without complication (HCC), GERD (gastroesophageal reflux disease), Hyperlipidemia, Hypertension, Morbid (severe) obesity due to excess calories (HCC) (08/07/2023), Wears glasses, and Wound, open, foot.   Significant Hospital Events: Including procedures, antibiotic start and stop dates in addition to other pertinent events   7/13 admit with DKA and encephalopathy  Interim History / Subjective:  As above  Objective    Blood pressure (!) 112/101, pulse 86, temperature 98 F (36.7 C), resp. rate (!) 21, height 6' (1.829 m), weight (!) 156 kg, SpO2 98%.       No intake or output data in the 24 hours ending 02/21/24 2336 Filed Weights   02/21/24 2103  Weight: (!) 156 kg   General:  obese M, ill-appearing, resting in bed in NAD HEENT: MM pink/moist Neuro: eyes open and protecting his airway, spontaneously moving but not following commands and not answering questions CV: s1s2 rrr, no m/r/g PULM:  kussmaul breathing, maintaining sats and protecting airway on [place], no rhonchi  or wheezing GI: soft, bsx4 active  Extremities: warm/dry, chronic lymphedema, partial amputation of the R foot, diabetic foot wound of the L plantar aspect without purulent drainage or erythema/induration    Resolved problem list   Assessment and Plan    Type 2 DM with acute DKA  L diabetic foot wound Hyperkalemia Received 3L IVF and 1 amp bicarb -admit to ICU, continue endotool and IVF -follow K on next BMP, give Ca gluc 2g -repeat VBG -possible L foot infection though this is chronic and he was treated with inpatient and outpatient abx after his last admission, may need MRI when more stable, consult wound care -continue Vanc/cefepime  and follow Bcx2 -trend BHOB  AKI -likely secondary to volume depletion -follow renal indices after IVF, monitor UOP -avoid nephrotoxins   Acute Encephalopathy -likely secondary to DKA but head CT is pending   Atrial Fibrillation HTN HL -currently in sinus, hold home bb and valsartan  for now in the setting of borderline low BP -resume statin when able to swallow     Best Practice (right click and Reselect all SmartList Selections daily)   Diet/type: NPO DVT prophylaxis prophylactic heparin   Pressure ulcer(s): N/A GI prophylaxis: N/A Lines: N/A Foley:  N/A Code Status:  full code Last date of multidisciplinary goals of care discussion [pending ]  Labs   CBC: Recent Labs  Lab 02/21/24 2130 02/21/24 2134  WBC 25.9*  --   NEUTROABS 24.5*  --   HGB 9.6* 11.9*  HCT 36.4* 35.0*  MCV 103.7*  --  PLT 487*  --     Basic Metabolic Panel: Recent Labs  Lab 02/21/24 2130 02/21/24 2134  NA 134* 134*  K 6.9* 7.5*  CL 100  --   CO2 <7*  --   GLUCOSE 1,076*  --   BUN 47*  --   CREATININE 3.29*  --   CALCIUM  9.3  --    GFR: Estimated Creatinine Clearance: 40.5 mL/min (A) (by C-G formula based on SCr of 3.29 mg/dL (H)). Recent Labs  Lab 02/21/24 2130 02/21/24 2134  WBC 25.9*  --   LATICACIDVEN  --  2.5*    Liver  Function Tests: Recent Labs  Lab 02/21/24 2130  AST 16  ALT 12  ALKPHOS 112  BILITOT 2.6*  PROT 8.6*  ALBUMIN  2.0*   No results for input(s): LIPASE, AMYLASE in the last 168 hours. No results for input(s): AMMONIA in the last 168 hours.  ABG    Component Value Date/Time   HCO3 3.6 (L) 02/21/2024 2134   TCO2 <5 (L) 02/21/2024 2134   ACIDBASEDEF 28.0 (H) 02/21/2024 2134   O2SAT 80 02/21/2024 2134     Coagulation Profile: Recent Labs  Lab 02/21/24 2130  INR 1.6*    Cardiac Enzymes: No results for input(s): CKTOTAL, CKMB, CKMBINDEX, TROPONINI in the last 168 hours.  HbA1C: HbA1c, POC (controlled diabetic range)  Date/Time Value Ref Range Status  06/25/2023 03:29 PM 7.9 (A) 0.0 - 7.0 % Final  01/19/2023 11:26 AM 9.7 (A) 0.0 - 7.0 % Final   Hgb A1c MFr Bld  Date/Time Value Ref Range Status  11/11/2023 03:19 AM 9.1 (H) 4.8 - 5.6 % Final    Comment:    (NOTE) Pre diabetes:          5.7%-6.4%  Diabetes:              >6.4%  Glycemic control for   <7.0% adults with diabetes   03/23/2023 12:05 AM 9.8 (H) 4.8 - 5.6 % Final    Comment:    (NOTE)         Prediabetes: 5.7 - 6.4         Diabetes: >6.4         Glycemic control for adults with diabetes: <7.0     CBG: Recent Labs  Lab 02/21/24 2109 02/21/24 2320  GLUCAP >600* >600*    Review of Systems:   Unable to obtain  Past Medical History:  He,  has a past medical history of Atrial fibrillation (HCC), Diabetes mellitus without complication (HCC), GERD (gastroesophageal reflux disease), Hyperlipidemia, Hypertension, Morbid (severe) obesity due to excess calories (HCC) (08/07/2023), Wears glasses, and Wound, open, foot.   Surgical History:   Past Surgical History:  Procedure Laterality Date   AMPUTATION Left 08/20/2018   Procedure: LEFT FOOT IRRIGATON AND DEBRIDEMENT, 5TH RAY AMPUTATION;  Surgeon: Harden Jerona GAILS, MD;  Location: [facility] OR;  Service: Orthopedics;  Laterality: Left;   AMPUTATION  Right 03/25/2023   Procedure: RIGHT TRANSMETATARSAL AMPUTATION;  Surgeon: Harden Jerona GAILS, MD;  Location: [facility] OR;  Service: Orthopedics;  Laterality: Right;   APPLICATION OF A-CELL OF CHEST/ABDOMEN N/A 05/30/2019   Procedure: Application Of A-Cell Of Chest;  Surgeon: Shyrl Linnie KIDD, MD;  Location: [facility] OR;  Service: Cardiothoracic;  Laterality: N/A;   APPLICATION OF WOUND VAC N/A 05/30/2019   Procedure: Application Of Wound Vac;  Surgeon: Shyrl Linnie KIDD, MD;  Location: [facility] OR;  Service: Cardiothoracic;  Laterality: N/A;   FINGER SURGERY Right 2016  I&D  small finger   I & D EXTREMITY Left 07/30/2018   Procedure: IRRIGATION AND DEBRIDEMENT LEFT FOOT WITH POSSIBLE AMPUTATION OF FIFTH TOE;  Surgeon: Vernetta Lonni GRADE, MD;  Location: [facility] ORS;  Service: Orthopedics;  Laterality: Left;   INCISION AND DRAINAGE ABSCESS Left 01/19/2019   Procedure: INCISION AND DRAINAGE ABSCESS upper chest;  Surgeon: Carlie Clark, MD;  Location: [facility] OR;  Service: ENT;  Laterality: Left;   IRRIGATION AND DEBRIDEMENT STERNOCLAVICULAR JOINT-STERNUM AND RIBS N/A 05/30/2019   Procedure: IRRIGATION AND DEBRIDEMENT OF STERNOCLAVICULAR JOINT-STERNUM AND RIBS ;  Surgeon: Shyrl Linnie KIDD, MD;  Location: [facility] OR;  Service: Cardiothoracic;  Laterality: N/A;   MINOR IRRIGATION AND DEBRIDEMENT OF WOUND N/A 11/22/2018   Procedure: INCISION AND DRAINAGE OF NECK ABSCESS;  Surgeon: Carlie Clark, MD;  Location: [facility] ORS;  Service: ENT;  Laterality: N/A;     Social History:   reports that he has never smoked. He has never used smokeless tobacco. He reports that he does not drink alcohol and does not use drugs.   Family History:  His family history includes Colon cancer in his brother; Diabetes in his mother. There is no history of Esophageal cancer, Rectal cancer, or Stomach cancer.   Allergies No Known Allergies   Home Medications  Prior to Admission medications   Medication Sig Start Date End Date Taking? Authorizing  Provider  Accu-Chek Softclix Lancets lancets Use to check blood sugar 3 times daily. 02/09/24   Newlin, Enobong, MD  atorvastatin  (LIPITOR) 20 MG tablet Take 1 tablet (20 mg total) by mouth daily. 12/03/23   Danton Jon HERO, PA-C  Blood Glucose Monitoring Suppl (ACCU-CHEK GUIDE) w/Device KIT Use to check blood sugar 3 times daily. 06/02/23   Newlin, Enobong, MD  Continuous Glucose Receiver (FREESTYLE LIBRE 3 READER) DEVI Use as directed  every 14 (fourteen) days. Patient not taking: Reported on 12/17/2023 12/03/23   Danton Jon HERO, PA-C  Continuous Glucose Sensor (FREESTYLE LIBRE 3 SENSOR) MISC Use as directed .Place 1 sensor on the skin every 14 days. Use to check glucose continuously Patient not taking: Reported on 12/17/2023 12/03/23   Danton Jon HERO, PA-C  gabapentin  (NEURONTIN ) 300 MG capsule Take 1 capsule (300 mg total) by mouth 2 (two) times daily. 10/06/23   Newlin, Enobong, MD  glucose blood (ACCU-CHEK GUIDE TEST) test strip use as directed 3 (three) times daily. 06/02/23   Newlin, Enobong, MD  glucose blood (ACCU-CHEK GUIDE) test strip Use to check blood sugar 3 times daily. 06/02/23   Newlin, Enobong, MD  insulin  glargine (LANTUS  SOLOSTAR) 100 UNIT/ML Solostar Pen Inject 27 Units into the skin daily. 02/04/24   Newlin, Enobong, MD  insulin  lispro (HUMALOG  KWIKPEN) 100 UNIT/ML KwikPen Inject 0-12 units into the skin three times daily with meals. Per sliding scale. 01/22/24   Newlin, Enobong, MD  Insulin  Pen Needle (TECHLITE PEN NEEDLES) 32G X 6 MM MISC USE AS DIRECTED 01/22/24   Newlin, Enobong, MD  Menthol , Topical Analgesic, (ICY HOT BACK) 5 % PTCH Apply 1 patch topically daily. Patient not taking: Reported on 12/17/2023 11/25/23   Newlin, Enobong, MD  metoprolol  tartrate (LOPRESSOR ) 50 MG tablet Take 1 tablet (50 mg total) by mouth 2 (two) times daily. 01/22/24   Newlin, Enobong, MD  Misc. Devices MISC Blood pressure monitor.  Diagnosis-hypertension. 12/25/23   Newlin, Enobong, MD  naloxone   (NARCAN ) nasal spray 4 mg/0.1 mL USE 1 SPRAY ON 1 NOSTRIL ONCE, MAY REPEAT DOSE EVERY 2 TO 3 MINUTES UNTIL REPSONSIVE  OR EMS ARRIVE FOR AN OPIOID EMERGENCY. Patient not taking: Reported on 12/17/2023 11/10/23     naloxone  (NARCAN ) nasal spray 4 mg/0.1 mL Place 1 spray into one nostril as needed for opioid emergency. May repeat every 2-3 min until responsive or EMS  arrives. 12/01/23     omeprazole  (PRILOSEC) 40 MG capsule Take 1 capsule (40 mg total) by mouth daily. 12/03/23   Danton Jon HERO, PA-C  sildenafil  (VIAGRA ) 100 MG tablet Take 1 tablet (100 mg total) by mouth daily as needed for erectile dysfunction. At least 24 hours between doses 01/22/24   Newlin, Enobong, MD  valsartan  (DIOVAN ) 160 MG tablet Take 1 tablet (160 mg total) by mouth daily. 12/03/23   Danton Jon HERO, PA-C  furosemide  (LASIX ) 20 MG tablet Take 2 tablets (40 mg total) by mouth daily. Patient not taking: Reported on 08/07/2023 05/14/23 11/04/23  Newlin, Enobong, MD     Critical care time:  45 minutes      CRITICAL CARE Performed by: Leita SAUNDERS Arlayne Liggins   Total critical care time: 45 minutes  Critical care time was exclusive of separately billable procedures and treating other patients.  Critical care was necessary to treat or prevent imminent or life-threatening deterioration.  Critical care was time spent personally by me on the following activities: development of treatment plan with patient and/or surrogate as well as nursing, discussions with consultants, evaluation of patient's response to treatment, examination of patient, obtaining history from patient or surrogate, ordering and performing treatments and interventions, ordering and review of laboratory studies, ordering and review of radiographic studies, pulse oximetry and re-evaluation of patient's condition.  Leita SAUNDERS Deshannon Seide, PA-C [facility] See Amion for pager If no response to pager , please call [phone] until 7pm After 7:00 pm call  Elink  [phone]

## 2024-02-21 NOTE — ED Notes (Signed)
 Provider to bedside. Pt transferred to the stretcher and placed on the monitor. Responds to pain. Doesn't follow commands. Isnt talking to us . Per family, last seen himself on Saturday around lunch.

## 2024-02-21 NOTE — ED Provider Notes (Signed)
 [place] EMERGENCY DEPARTMENT AT [facility] Provider Note   CSN: [id number] Arrival date & time: 02/21/24  2056     Patient presents with: Hyperglycemia   Taylor Hughes is a 53 y.o. male.   53 year old male with past medical history of recurrent admissions for DKA in the past presenting to the emergency department today with altered mental status.  Last known normal for the patient was over 24 hours ago.  The patient presents today with blood sugar reading high.  The patient will localize to painful stimuli at this time and is confused.   Hyperglycemia Associated symptoms: confusion        Prior to Admission medications   Medication Sig Start Date End Date Taking? Authorizing Provider  Accu-Chek Softclix Lancets lancets Use to check blood sugar 3 times daily. 02/09/24   Newlin, Enobong, MD  atorvastatin  (LIPITOR) 20 MG tablet Take 1 tablet (20 mg total) by mouth daily. 12/03/23   Danton Jon HERO, PA-C  Blood Glucose Monitoring Suppl (ACCU-CHEK GUIDE) w/Device KIT Use to check blood sugar 3 times daily. 06/02/23   Newlin, Enobong, MD  Continuous Glucose Receiver (FREESTYLE LIBRE 3 READER) DEVI Use as directed  every 14 (fourteen) days. Patient not taking: Reported on 12/17/2023 12/03/23   Danton Jon HERO, PA-C  Continuous Glucose Sensor (FREESTYLE LIBRE 3 SENSOR) MISC Use as directed .Place 1 sensor on the skin every 14 days. Use to check glucose continuously Patient not taking: Reported on 12/17/2023 12/03/23   Danton Jon HERO, PA-C  gabapentin  (NEURONTIN ) 300 MG capsule Take 1 capsule (300 mg total) by mouth 2 (two) times daily. 10/06/23   Newlin, Enobong, MD  glucose blood (ACCU-CHEK GUIDE TEST) test strip use as directed 3 (three) times daily. 06/02/23   Newlin, Enobong, MD  glucose blood (ACCU-CHEK GUIDE) test strip Use to check blood sugar 3 times daily. 06/02/23   Newlin, Enobong, MD  insulin  glargine (LANTUS  SOLOSTAR) 100 UNIT/ML Solostar Pen Inject 27 Units into the  skin daily. 02/04/24   Newlin, Enobong, MD  insulin  lispro (HUMALOG  KWIKPEN) 100 UNIT/ML KwikPen Inject 0-12 units into the skin three times daily with meals. Per sliding scale. 01/22/24   Newlin, Enobong, MD  Insulin  Pen Needle (TECHLITE PEN NEEDLES) 32G X 6 MM MISC USE AS DIRECTED 01/22/24   Newlin, Enobong, MD  Menthol , Topical Analgesic, (ICY HOT BACK) 5 % PTCH Apply 1 patch topically daily. Patient not taking: Reported on 12/17/2023 11/25/23   Newlin, Enobong, MD  metoprolol  tartrate (LOPRESSOR ) 50 MG tablet Take 1 tablet (50 mg total) by mouth 2 (two) times daily. 01/22/24   Newlin, Enobong, MD  Misc. Devices MISC Blood pressure monitor.  Diagnosis-hypertension. 12/25/23   Newlin, Enobong, MD  naloxone  (NARCAN ) nasal spray 4 mg/0.1 mL USE 1 SPRAY ON 1 NOSTRIL ONCE, MAY REPEAT DOSE EVERY 2 TO 3 MINUTES UNTIL REPSONSIVE OR EMS ARRIVE FOR AN OPIOID EMERGENCY. Patient not taking: Reported on 12/17/2023 11/10/23     naloxone  (NARCAN ) nasal spray 4 mg/0.1 mL Place 1 spray into one nostril as needed for opioid emergency. May repeat every 2-3 min until responsive or EMS  arrives. 12/01/23     omeprazole  (PRILOSEC) 40 MG capsule Take 1 capsule (40 mg total) by mouth daily. 12/03/23   Danton Jon HERO, PA-C  sildenafil  (VIAGRA ) 100 MG tablet Take 1 tablet (100 mg total) by mouth daily as needed for erectile dysfunction. At least 24 hours between doses 01/22/24   Newlin, Enobong, MD  valsartan  (DIOVAN ) 160  MG tablet Take 1 tablet (160 mg total) by mouth daily. 12/03/23   Danton Jon HERO, PA-C  furosemide  (LASIX ) 20 MG tablet Take 2 tablets (40 mg total) by mouth daily. Patient not taking: Reported on 08/07/2023 05/14/23 11/04/23  Newlin, Enobong, MD    Allergies: Patient has no known allergies.    Review of Systems  Reason unable to perform ROS: Altered mental status.  Psychiatric/Behavioral:  Positive for confusion.     Updated Vital Signs BP (!) 108/52   Pulse 88   Temp 98 F (36.7 C)   Resp (!) 25   Ht  6' (1.829 m)   Wt (!) 156 kg   SpO2 100%   BMI 46.64 kg/m   Physical Exam Vitals and nursing note reviewed.   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 Resp: clear to auscultation bilaterally Card: RRR, no murmurs, rubs, or gallops Abd: nontender, nondistended Extremities: no calf tenderness, no edema Vascular: 2+ radial pulses bilaterally, 2+ DP pulses bilaterally Neuro: Will arouse to mild verbal stimuli, incomprehensible speech, will move upper extremities to painful stimuli bilaterally Skin: no rashes Psyc: acting appropriately   (all labs ordered are listed, but only abnormal results are displayed) Labs Reviewed  COMPREHENSIVE METABOLIC PANEL WITH GFR - Abnormal; Notable for the following components:      Result Value   Sodium 134 (*)    Potassium 6.9 (*)    CO2 <7 (*)    Glucose, Bld 1,076 (*)    BUN 47 (*)    Creatinine, Ser 3.29 (*)    Total Protein 8.6 (*)    Albumin  2.0 (*)    Total Bilirubin 2.6 (*)    GFR, Estimated 22 (*)    All other components within normal limits  CBC WITH DIFFERENTIAL/PLATELET - Abnormal; Notable for the following components:   WBC 25.9 (*)    RBC 3.51 (*)    Hemoglobin 9.6 (*)    HCT 36.4 (*)    MCV 103.7 (*)    MCHC 26.4 (*)    RDW 19.1 (*)    Platelets 487 (*)    Neutro Abs 24.5 (*)    Abs Immature Granulocytes 0.39 (*)    All other components within normal limits  PROTIME-INR - Abnormal; Notable for the following components:   Prothrombin Time 20.1 (*)    INR 1.6 (*)    All other components within normal limits  URINALYSIS, W/ REFLEX TO CULTURE (INFECTION SUSPECTED) - Abnormal; Notable for the following components:   APPearance HAZY (*)    Glucose, UA >=500 (*)    Hgb urine dipstick MODERATE (*)    Ketones, ur 20 (*)    Protein, ur 100 (*)    Bacteria, UA RARE (*)    All other components  within normal limits  I-STAT CG4 LACTIC ACID, ED - Abnormal; Notable for the following components:   Lactic Acid, Venous 2.5 (*)    All other components within normal limits  CBG MONITORING, ED - Abnormal; Notable for the following components:   Glucose-Capillary >600 (*)    All other components within normal limits  I-STAT VENOUS BLOOD GAS, ED - Abnormal; Notable for the following components:   pH, Ven 6.883 (*)    pCO2, Ven 19.1 (*)    pO2, Ven 73 (*)    Bicarbonate 3.6 (*)    TCO2 <5 (*)  Acid-base deficit 28.0 (*)    Sodium 134 (*)    Potassium 7.5 (*)    HCT 35.0 (*)    Hemoglobin 11.9 (*)    All other components within normal limits  RESP PANEL BY RT-PCR (RSV, FLU A&B, COVID)  RVPGX2  CULTURE, BLOOD (ROUTINE X 2)  CULTURE, BLOOD (ROUTINE X 2)  BLOOD GAS, VENOUS  BASIC METABOLIC PANEL WITH GFR  BASIC METABOLIC PANEL WITH GFR  BASIC METABOLIC PANEL WITH GFR  BASIC METABOLIC PANEL WITH GFR  BETA-HYDROXYBUTYRIC ACID  BETA-HYDROXYBUTYRIC ACID  BETA-HYDROXYBUTYRIC ACID  BETA-HYDROXYBUTYRIC ACID  CBC WITH DIFFERENTIAL/PLATELET  URINALYSIS, ROUTINE W REFLEX MICROSCOPIC  CBG MONITORING, ED  I-STAT VENOUS BLOOD GAS, ED    EKG: EKG Interpretation Date/Time:  Sunday February 21 2024 21:10:21 EDT Ventricular Rate:  92 PR Interval:  164 QRS Duration:  99 QT Interval:  356 QTC Calculation: 441 R Axis:   47  Text Interpretation: Sinus rhythm Probable left atrial enlargement Confirmed by Ula Barter [phone]) on 02/21/2024 9:19:51 PM  Radiology: [facility] Foot Complete Left Result Date: 02/21/2024 CLINICAL DATA:  Questionable sepsis EXAM: LEFT FOOT - COMPLETE 3+ VIEW COMPARISON:  MRI of the left foot 11/11/2023. FINDINGS: Fifth ray is absent. The mid and distal third phalanx are absent, a new finding. There is irregular cortical thickening along the lateral margin of the mid and proximal fourth and third metatarsals. There is no acute fracture or dislocation identified. There is diffuse  soft tissue swelling of the foot. There is a plantar ulceration overlying the midfoot. Charcot joint arthropathy noted of the midfoot, similar to prior MRI. IMPRESSION: 1. The mid and distal third phalanx are absent, a new finding. 2. Irregular cortical thickening along the lateral margin of the mid and proximal fourth and third metatarsals. Findings are concerning for osteomyelitis. 3. Diffuse soft tissue swelling of the foot. 4. Plantar ulceration overlying the midfoot. 5. Charcot joint arthropathy of the midfoot, similar to prior MRI. Electronically Signed   By: Greig Pique M.D.   On: 02/21/2024 21:45   DG Chest Port 1 View Result Date: 02/21/2024 CLINICAL DATA:  Questionable sepsis EXAM: PORTABLE CHEST 1 VIEW COMPARISON:  Chest x-ray 11/10/2023 FINDINGS: There are increased interstitial markings centrally in both lungs. Costophrenic angles are clear. No pneumothorax. The heart is mildly enlarged, unchanged. No acute fractures are identified. IMPRESSION: Increased interstitial markings centrally in both lungs, which may represent pulmonary edema or atypical infection. Electronically Signed   By: Greig Pique M.D.   On: 02/21/2024 21:42     Procedures   Medications Ordered in the ED  lactated ringers  infusion ( Intravenous New Bag/Given 02/21/24 2151)  lactated ringers  bolus 1,000 mL (1,000 mLs Intravenous New Bag/Given 02/21/24 2109)    And  lactated ringers  bolus 1,000 mL (1,000 mLs Intravenous New Bag/Given 02/21/24 2109)    And  lactated ringers  bolus 1,000 mL (has no administration in time range)  metroNIDAZOLE  (FLAGYL ) IVPB 500 mg (500 mg Intravenous New Bag/Given 02/21/24 2157)  vancomycin  (VANCOREADY) IVPB 2000 mg/400 mL (has no administration in time range)  insulin  regular, human (MYXREDLIN ) 100 units/ 100 mL infusion (has no administration in time range)  lactated ringers  infusion (has no administration in time range)  dextrose  5 % in lactated ringers  infusion (has no administration  in time range)  dextrose  50 % solution 0-50 mL (has no administration in time range)  ceFEPIme  (MAXIPIME ) 2 g in sodium chloride  0.9 % 100 mL IVPB (0 g Intravenous Stopped 02/21/24 2213)  sodium bicarbonate  injection 50 mEq (50 mEq Intravenous Given 02/21/24 2204)                                    Medical Decision Making 53 year old male with past medical history of diabetes and recurrent DKA in the past presenting to the emergency department today with concern for likely recurrent DKA.  The patient does have a wound noted to his left lower extremity.  Will cover him with antibiotics initiate sepsis workup.  Will give patient IV fluids here.  Will likely need insulin  infusion.  The patient is having coo small respirations here but is protecting his airway at this time.  Will hold off on intubation at this time.  Will monitor closely for the need for this.  The patient's pH is less than 6.9.  He is given bicarb.  CMP is consistent with DKA.  Endo tool is started and patient started on insulin  infusion.  Calls placed to the ICU for admission.  CRITICAL CARE Performed by: Prentice JONELLE Medicus   Total critical care time: 40 minutes  Critical care time was exclusive of separately billable procedures and treating other patients.  Critical care was necessary to treat or prevent imminent or life-threatening deterioration.  Critical care was time spent personally by me on the following activities: development of treatment plan with patient and/or surrogate as well as nursing, discussions with consultants, evaluation of patient's response to treatment, examination of patient, obtaining history from patient or surrogate, ordering and performing treatments and interventions, ordering and review of laboratory studies, ordering and review of radiographic studies, pulse oximetry and re-evaluation of patient's condition.   Amount and/or Complexity of Data Reviewed Labs: ordered. Radiology:  ordered.  Risk Prescription drug management.        Final diagnoses:  Osteomyelitis, unspecified site, unspecified type (HCC)  Diabetic ketoacidosis without coma associated with other specified diabetes mellitus [facility])    ED Discharge Orders     None          [facility] Prentice JONELLE, MD 02/21/24 2226

## 2024-02-21 NOTE — ED Triage Notes (Signed)
 EMS called for patient being unresponsive. Arrives airway intact, tachypneic. Unresponsive except to pain. Glucose reading high via EMS.

## 2024-02-21 NOTE — Progress Notes (Signed)
 Pt being followed by [facility]Link for Sepsis protocol.

## 2024-02-22 DIAGNOSIS — R6520 Severe sepsis without septic shock: Secondary | ICD-10-CM | POA: Diagnosis not present

## 2024-02-22 DIAGNOSIS — G9341 Metabolic encephalopathy: Secondary | ICD-10-CM | POA: Diagnosis not present

## 2024-02-22 DIAGNOSIS — G9340 Encephalopathy, unspecified: Secondary | ICD-10-CM

## 2024-02-22 DIAGNOSIS — E860 Dehydration: Secondary | ICD-10-CM | POA: Diagnosis not present

## 2024-02-22 DIAGNOSIS — I4891 Unspecified atrial fibrillation: Secondary | ICD-10-CM | POA: Diagnosis not present

## 2024-02-22 DIAGNOSIS — D638 Anemia in other chronic diseases classified elsewhere: Secondary | ICD-10-CM | POA: Diagnosis not present

## 2024-02-22 DIAGNOSIS — N179 Acute kidney failure, unspecified: Secondary | ICD-10-CM | POA: Diagnosis not present

## 2024-02-22 DIAGNOSIS — Z1152 Encounter for screening for COVID-19: Secondary | ICD-10-CM | POA: Diagnosis not present

## 2024-02-22 DIAGNOSIS — E43 Unspecified severe protein-calorie malnutrition: Secondary | ICD-10-CM | POA: Diagnosis not present

## 2024-02-22 DIAGNOSIS — A401 Sepsis due to streptococcus, group B: Secondary | ICD-10-CM | POA: Diagnosis not present

## 2024-02-22 DIAGNOSIS — K219 Gastro-esophageal reflux disease without esophagitis: Secondary | ICD-10-CM

## 2024-02-22 DIAGNOSIS — E1110 Type 2 diabetes mellitus with ketoacidosis without coma: Secondary | ICD-10-CM | POA: Diagnosis not present

## 2024-02-22 LAB — GLUCOSE, CAPILLARY
Glucose-Capillary: 165 mg/dL — ABNORMAL HIGH (ref 70–99)
Glucose-Capillary: 165 mg/dL — ABNORMAL HIGH (ref 70–99)
Glucose-Capillary: 181 mg/dL — ABNORMAL HIGH (ref 70–99)
Glucose-Capillary: 221 mg/dL — ABNORMAL HIGH (ref 70–99)
Glucose-Capillary: 250 mg/dL — ABNORMAL HIGH (ref 70–99)
Glucose-Capillary: 298 mg/dL — ABNORMAL HIGH (ref 70–99)
Glucose-Capillary: 371 mg/dL — ABNORMAL HIGH (ref 70–99)
Glucose-Capillary: 405 mg/dL — ABNORMAL HIGH (ref 70–99)
Glucose-Capillary: 424 mg/dL — ABNORMAL HIGH (ref 70–99)
Glucose-Capillary: 429 mg/dL — ABNORMAL HIGH (ref 70–99)
Glucose-Capillary: 505 mg/dL (ref 70–99)
Glucose-Capillary: 536 mg/dL (ref 70–99)
Glucose-Capillary: 542 mg/dL (ref 70–99)
Glucose-Capillary: 543 mg/dL (ref 70–99)
Glucose-Capillary: 578 mg/dL (ref 70–99)
Glucose-Capillary: 587 mg/dL (ref 70–99)
Glucose-Capillary: >600 mg/dL (ref 70–99)
Glucose-Capillary: >600 mg/dL (ref 70–99)
Glucose-Capillary: >600 mg/dL (ref 70–99)
Glucose-Capillary: >600 mg/dL (ref 70–99)
Glucose-Capillary: >600 mg/dL (ref 70–99)
Glucose-Capillary: >600 mg/dL (ref 70–99)
Glucose-Capillary: >600 mg/dL (ref 70–99)
Glucose-Capillary: >600 mg/dL (ref 70–99)
Glucose-Capillary: >600 mg/dL (ref 70–99)
Glucose-Capillary: >600 mg/dL (ref 70–99)
Glucose-Capillary: >600 mg/dL (ref 70–99)
Glucose-Capillary: >600 mg/dL (ref 70–99)
Glucose-Capillary: >600 mg/dL (ref 70–99)
Glucose-Capillary: >600 mg/dL (ref 70–99)
Glucose-Capillary: >600 mg/dL (ref 70–99)
Glucose-Capillary: >600 mg/dL (ref 70–99)
Glucose-Capillary: >600 mg/dL (ref 70–99)
Glucose-Capillary: >600 mg/dL (ref 70–99)
Glucose-Capillary: >600 mg/dL (ref 70–99)
Glucose-Capillary: >600 mg/dL (ref 70–99)
Glucose-Capillary: >600 mg/dL (ref 70–99)

## 2024-02-22 LAB — BASIC METABOLIC PANEL WITH GFR
Anion gap: 23 — ABNORMAL HIGH (ref 5–15)
Anion gap: 20 — ABNORMAL HIGH (ref 5–15)
Anion gap: 16 — ABNORMAL HIGH (ref 5–15)
Anion gap: 14 (ref 5–15)
BUN: 46 mg/dL — ABNORMAL HIGH (ref 6–20)
BUN: 49 mg/dL — ABNORMAL HIGH (ref 6–20)
BUN: 50 mg/dL — ABNORMAL HIGH (ref 6–20)
BUN: 50 mg/dL — ABNORMAL HIGH (ref 6–20)
BUN: 48 mg/dL — ABNORMAL HIGH (ref 6–20)
CO2: <7 mmol/L — ABNORMAL LOW (ref 22–32)
CO2: 12 mmol/L — ABNORMAL LOW (ref 22–32)
CO2: 15 mmol/L — ABNORMAL LOW (ref 22–32)
CO2: 16 mmol/L — ABNORMAL LOW (ref 22–32)
CO2: 17 mmol/L — ABNORMAL LOW (ref 22–32)
Calcium: 8.9 mg/dL (ref 8.9–10.3)
Calcium: 9 mg/dL (ref 8.9–10.3)
Calcium: 9.3 mg/dL (ref 8.9–10.3)
Calcium: 9.2 mg/dL (ref 8.9–10.3)
Calcium: 9.3 mg/dL (ref 8.9–10.3)
Chloride: 103 mmol/L (ref 98–111)
Chloride: 103 mmol/L (ref 98–111)
Chloride: 103 mmol/L (ref 98–111)
Chloride: 110 mmol/L (ref 98–111)
Chloride: 111 mmol/L (ref 98–111)
Creatinine, Ser: 3.44 mg/dL — ABNORMAL HIGH (ref 0.61–1.24)
Creatinine, Ser: 3.14 mg/dL — ABNORMAL HIGH (ref 0.61–1.24)
Creatinine, Ser: 2.94 mg/dL — ABNORMAL HIGH (ref 0.61–1.24)
Creatinine, Ser: 2.72 mg/dL — ABNORMAL HIGH (ref 0.61–1.24)
Creatinine, Ser: 2.38 mg/dL — ABNORMAL HIGH (ref 0.61–1.24)
GFR, Estimated: 21 mL/min — ABNORMAL LOW (ref >60)
GFR, Estimated: 23 mL/min — ABNORMAL LOW (ref >60)
GFR, Estimated: 25 mL/min — ABNORMAL LOW (ref >60)
GFR, Estimated: 27 mL/min — ABNORMAL LOW (ref >60)
GFR, Estimated: 32 mL/min — ABNORMAL LOW (ref >60)
Glucose, Bld: 964 mg/dL (ref 70–99)
Glucose, Bld: 769 mg/dL (ref 70–99)
Glucose, Bld: 615 mg/dL (ref 70–99)
Glucose, Bld: 399 mg/dL — ABNORMAL HIGH (ref 70–99)
Glucose, Bld: 181 mg/dL — ABNORMAL HIGH (ref 70–99)
Potassium: 5 mmol/L (ref 3.5–5.1)
Potassium: 4.1 mmol/L (ref 3.5–5.1)
Potassium: 3.6 mmol/L (ref 3.5–5.1)
Potassium: 3.3 mmol/L — ABNORMAL LOW (ref 3.5–5.1)
Potassium: 3.5 mmol/L (ref 3.5–5.1)
Sodium: 136 mmol/L (ref 135–145)
Sodium: 138 mmol/L (ref 135–145)
Sodium: 138 mmol/L (ref 135–145)
Sodium: 142 mmol/L (ref 135–145)
Sodium: 142 mmol/L (ref 135–145)

## 2024-02-22 LAB — CBC
HCT: 31.2 % — ABNORMAL LOW (ref 39.0–52.0)
HCT: 28.3 % — ABNORMAL LOW (ref 39.0–52.0)
Hemoglobin: 8.6 g/dL — ABNORMAL LOW (ref 13.0–17.0)
Hemoglobin: 8.6 g/dL — ABNORMAL LOW (ref 13.0–17.0)
MCH: 27.7 pg (ref 26.0–34.0)
MCH: 27.4 pg (ref 26.0–34.0)
MCHC: 27.6 g/dL — ABNORMAL LOW (ref 30.0–36.0)
MCHC: 30.4 g/dL (ref 30.0–36.0)
MCV: 100.3 fL — ABNORMAL HIGH (ref 80.0–100.0)
MCV: 90.1 fL (ref 80.0–100.0)
Platelets: 383 K/uL (ref 150–400)
Platelets: 396 K/uL (ref 150–400)
RBC: 3.11 MIL/uL — ABNORMAL LOW (ref 4.22–5.81)
RBC: 3.14 MIL/uL — ABNORMAL LOW (ref 4.22–5.81)
RDW: 19.2 % — ABNORMAL HIGH (ref 11.5–15.5)
RDW: 18.5 % — ABNORMAL HIGH (ref 11.5–15.5)
WBC: 27.7 K/uL — ABNORMAL HIGH (ref 4.0–10.5)
WBC: 26 K/uL — ABNORMAL HIGH (ref 4.0–10.5)
nRBC: 0.1 % (ref 0.0–0.2)
nRBC: 0.2 % (ref 0.0–0.2)

## 2024-02-22 LAB — BLOOD CULTURE ID PANEL (REFLEXED) - BCID2

## 2024-02-22 LAB — BETA-HYDROXYBUTYRIC ACID
Beta-Hydroxybutyric Acid: >8 mmol/L — ABNORMAL HIGH (ref 0.05–0.27)
Beta-Hydroxybutyric Acid: >8 mmol/L — ABNORMAL HIGH (ref 0.05–0.27)
Beta-Hydroxybutyric Acid: 7.18 mmol/L — ABNORMAL HIGH (ref 0.05–0.27)
Beta-Hydroxybutyric Acid: 0.06 mmol/L (ref 0.05–0.27)

## 2024-02-22 LAB — MRSA NEXT GEN BY PCR, NASAL: MRSA by PCR Next Gen: DETECTED — AB

## 2024-02-22 LAB — FERRITIN: Ferritin: 734 ng/mL — ABNORMAL HIGH (ref 24–336)

## 2024-02-22 LAB — HEMOGLOBIN AND HEMATOCRIT, BLOOD
HCT: 30.5 % — ABNORMAL LOW (ref 39.0–52.0)
Hemoglobin: 9.7 g/dL — ABNORMAL LOW (ref 13.0–17.0)

## 2024-02-22 LAB — IRON AND TIBC
Iron: 7 ug/dL — ABNORMAL LOW (ref 45–182)
Saturation Ratios: 4 % — ABNORMAL LOW (ref 17.9–39.5)
TIBC: 158 ug/dL — ABNORMAL LOW (ref 250–450)
UIBC: 151 ug/dL

## 2024-02-22 LAB — RETICULOCYTES
Immature Retic Fract: 23.5 % — ABNORMAL HIGH (ref 2.3–15.9)
RBC.: 3.14 MIL/uL — ABNORMAL LOW (ref 4.22–5.81)
Retic Count, Absolute: 56.2 K/uL (ref 19.0–186.0)
Retic Ct Pct: 1.8 % (ref 0.4–3.1)

## 2024-02-22 LAB — HEPATIC FUNCTION PANEL
ALT: 10 U/L (ref 0–44)
AST: 11 U/L — ABNORMAL LOW (ref 15–41)
Albumin: 1.7 g/dL — ABNORMAL LOW (ref 3.5–5.0)
Alkaline Phosphatase: 85 U/L (ref 38–126)
Bilirubin, Direct: <0.1 mg/dL (ref 0.0–0.2)
Total Bilirubin: 1.7 mg/dL — ABNORMAL HIGH (ref 0.0–1.2)
Total Protein: 7.5 g/dL (ref 6.5–8.1)

## 2024-02-22 LAB — TYPE AND SCREEN
ABO/RH(D): O POS
Antibody Screen: NEGATIVE

## 2024-02-22 LAB — MAGNESIUM: Magnesium: 2.6 mg/dL — ABNORMAL HIGH (ref 1.7–2.4)

## 2024-02-22 LAB — LACTIC ACID, PLASMA: Lactic Acid, Venous: 2 mmol/L (ref 0.5–1.9)

## 2024-02-22 LAB — PHOSPHORUS: Phosphorus: 1.6 mg/dL — ABNORMAL LOW (ref 2.5–4.6)

## 2024-02-22 LAB — FOLATE: Folate: 8.3 ng/mL (ref >5.9)

## 2024-02-22 LAB — VITAMIN B12: Vitamin B-12: 1451 pg/mL — ABNORMAL HIGH (ref 180–914)

## 2024-02-22 MED ORDER — VANCOMYCIN HCL 1500 MG/300ML IV SOLN: 1500.0000 mg | INTRAVENOUS | Status: DC

## 2024-02-22 MED ORDER — METOCLOPRAMIDE HCL 5 MG/ML IJ SOLN
5.0000 mg | Freq: Three times a day (TID) | INTRAMUSCULAR | Status: DC | PRN
  Administered 2024-02-22 (×2): 5 mg via INTRAVENOUS
  Filled 2024-02-22 (×3): qty 2

## 2024-02-22 MED ORDER — ONDANSETRON HCL 4 MG/2ML IJ SOLN
4.0000 mg | Freq: Four times a day (QID) | INTRAMUSCULAR | Status: DC | PRN
  Administered 2024-02-23 – 2024-02-25 (×2): 4 mg via INTRAVENOUS
  Filled 2024-02-22 (×2): qty 2

## 2024-02-22 MED ORDER — SODIUM CHLORIDE 0.9 % IV SOLN
2.0000 g | INTRAVENOUS | Status: DC
  Administered 2024-02-22 – 2024-02-24 (×3): 2 g via INTRAVENOUS
  Filled 2024-02-22 (×3): qty 20

## 2024-02-22 MED ORDER — MUPIROCIN 2 % EX OINT
1.0000 | TOPICAL_OINTMENT | Freq: Two times a day (BID) | CUTANEOUS | Status: AC
Start: 2024-02-22 — End: 2024-02-27
  Administered 2024-02-22 – 2024-02-26 (×9): 1 via NASAL
  Filled 2024-02-22 (×2): qty 22

## 2024-02-22 MED ORDER — CALCIUM GLUCONATE-NACL 2-0.675 GM/100ML-% IV SOLN
2.0000 g | Freq: Once | INTRAVENOUS | Status: AC
  Administered 2024-02-22: 2000 mg via INTRAVENOUS
  Filled 2024-02-22 (×2): qty 100

## 2024-02-22 MED ORDER — ORAL CARE MOUTH RINSE: 15.0000 mL | OROMUCOSAL | Status: DC | PRN

## 2024-02-22 MED ORDER — POTASSIUM PHOSPHATES 15 MMOLE/5ML IV SOLN
30.0000 mmol | Freq: Once | INTRAVENOUS | Status: AC
  Administered 2024-02-22: 30 mmol via INTRAVENOUS
  Filled 2024-02-22: qty 10

## 2024-02-22 MED ORDER — POTASSIUM CHLORIDE 10 MEQ/100ML IV SOLN
10.0000 meq | INTRAVENOUS | Status: AC
  Administered 2024-02-23 (×5): 10 meq via INTRAVENOUS
  Filled 2024-02-22 (×5): qty 100

## 2024-02-22 MED ORDER — LINEZOLID 600 MG/300ML IV SOLN
600.0000 mg | Freq: Two times a day (BID) | INTRAVENOUS | Status: DC
  Administered 2024-02-22: 600 mg via INTRAVENOUS
  Filled 2024-02-22: qty 300

## 2024-02-22 MED ORDER — POTASSIUM CHLORIDE 10 MEQ/100ML IV SOLN
10.0000 meq | INTRAVENOUS | Status: AC
  Administered 2024-02-22 (×4): 10 meq via INTRAVENOUS
  Filled 2024-02-22 (×4): qty 100

## 2024-02-22 MED ORDER — METOPROLOL TARTRATE 5 MG/5ML IV SOLN
2.5000 mg | INTRAVENOUS | Status: DC | PRN
  Administered 2024-02-22: 5 mg via INTRAVENOUS
  Filled 2024-02-22 (×2): qty 5

## 2024-02-22 MED ORDER — SODIUM CHLORIDE 0.9 % IV SOLN
2.0000 g | Freq: Two times a day (BID) | INTRAVENOUS | Status: DC
  Filled 2024-02-22: qty 12.5

## 2024-02-22 MED ORDER — PANTOPRAZOLE SODIUM 40 MG IV SOLR
40.0000 mg | Freq: Two times a day (BID) | INTRAVENOUS | Status: DC
  Administered 2024-02-22 – 2024-02-25 (×7): 40 mg via INTRAVENOUS
  Filled 2024-02-22 (×7): qty 10

## 2024-02-22 MED ORDER — LACTATED RINGERS IV BOLUS
500.0000 mL | Freq: Once | INTRAVENOUS | Status: AC
  Administered 2024-02-22: 500 mL via INTRAVENOUS

## 2024-02-22 NOTE — Progress Notes (Signed)
 Orthopedic Tech Progress Note Patient Details:  Taylor Hughes Apr 25, 1971 [id number]  Spoke with RN ( he just took over that patient) I let him know patient had an order for UNNA BOOTS, but he has a wound that has to be dressed 1st. Told him to call once done, if not the next shift could get to it  Patient ID: Taylor Hughes, male   DOB: 1971/04/19, 53 y.o.   MRN: [id number]  Taylor Hughes Pac 02/22/2024, 5:00 PM

## 2024-02-22 NOTE — Progress Notes (Addendum)
 eLink Physician-Brief Progress Note Patient Name: Taylor Hughes DOB: 1970/09/18 MRN: [id number]   Date of Service  02/22/2024  HPI/Events of Note  53 year old male with a history of type 2 diabetes mellitus obesity and gastroparesis who presents with DKA in the setting of a left foot infection.   Patient had a bout of coffee-ground emesis this a.m. and then had 2 occurrences of the beginning of shift, started on Reglan  with improvement in symptoms  eICU Interventions  Hemodynamics remained stable.  Repeat hemoglobin still pending.  On PPI twice daily  Will be needing GI evaluation in the morning   2318 -ongoing nausea.  Add second line Zofran .  Additional KCl per IV  Intervention Category Intermediate Interventions: Bleeding - evaluation and treatment with blood products  Taylor Hughes 02/22/2024, 10:23 PM

## 2024-02-22 NOTE — Inpatient Diabetes Management (Signed)
 Inpatient Diabetes Program Recommendations  AACE/ADA: New Consensus Statement on Inpatient Glycemic Control (2015)  Target Ranges:  Prepandial:   less than 140 mg/dL      Peak postprandial:   less than 180 mg/dL (1-2 hours)      Critically ill patients:  140 - 180 mg/dL    Latest Reference Range & Units 02/21/24 21:30  Sodium 135 - 145 mmol/L 134 (L)  Potassium 3.5 - 5.1 mmol/L 6.9 (HH)  Chloride 98 - 111 mmol/L 100  CO2 22 - 32 mmol/L <7 (L)  Glucose 70 - 99 mg/dL 8,923 (HH)  BUN 6 - 20 mg/dL 47 (H)  Creatinine 9.38 - 1.24 mg/dL 6.70 (H)  Calcium  8.9 - 10.3 mg/dL 9.3  Anion gap 5 - 15  NOT CALCULATED    Latest Reference Range & Units 02/22/24 01:11 02/22/24 03:26  Beta-Hydroxybutyric Acid 0.05 - 0.27 mmol/L >8.00 (H) >8.00 (H)  (H): Data is abnormally high  Latest Reference Range & Units 02/22/24 03:24 02/22/24 03:53 02/22/24 04:28 02/22/24 05:01 02/22/24 05:45 02/22/24 06:19 02/22/24 07:02 02/22/24 07:25 02/22/24 08:02 02/22/24 08:44  Glucose-Capillary 70 - 99 mg/dL >399 (HH)  IV Insulin  Drip Started at 2244 >600 (HH) >600 (HH) >600 (HH) >600 (HH) >600 (HH) >600 (HH) >600 (HH) >600 (HH) >600 (HH)  (HH): Data is critically high    Admit with: DKA  History: DM, prior DKA (last hospitalization for DKA 11/2023)  Home DM Meds: Freestyle Libre 3 CGM       Lantus  27 units daily       Humalog  0-12 units TID per SSI  Current Orders: IV Insulin  Drip     Note RN changing IV sites for the IV Insulin  (question about patency of IV)  Current A1c Pending  Last visit with PCP [facility]) 12/03/2023    --Will follow patient during hospitalization--  Adina Rudolpho Arrow RN, MSN, CDCES Diabetes Coordinator Inpatient Glycemic Control Team Team Pager: [phone] (8a-5p)

## 2024-02-22 NOTE — Progress Notes (Addendum)
 eLink Physician-Brief Progress Note Patient Name: Taylor Hughes DOB: 23-Oct-1970 MRN: [id number]   Date of Service  02/22/2024  HPI/Events of Note  53 year old male with a history of type 2 diabetes mellitus obesity and gastroparesis who presents with DKA in the setting of a left foot infection.  Vital signs consistent of his underlying diseases, saturating 98% on room air.  Currently on an insulin  infusion status post broad-spectrum antibiotics.  Results with severe metabolic acidosis complicated by hyperkalemia, hyperglycemia, elevated creatinine and leukocytosis.  Radiographic findings concerning for osteomyelitis.  eICU Interventions  Anticipate MRI of the left foot for potential osteomyelitis.  Broad-spectrum antibiotics in place.  Cultures in place  Insulin  protocol per Endo tool with aggressive hydration, electrolyte repletion, and ongoing insulin .  DVT prophylaxis with heparin  GI prophylaxis not indicated   0349 -notified of critical values pH 7.03, pCO2 <18; slight drop in urine output and pressure is trending downwards but not yet critical.  Additional LR bolus 500 cc.  Do not repeat blood gas.  BMP pending.  Intervention Category Evaluation Type: New Patient Evaluation  Micayla Brathwaite 02/22/2024, 1:01 AM

## 2024-02-22 NOTE — Progress Notes (Signed)
 Pharmacy Antibiotic Note  Taylor Hughes is a 53 y.o. male admitted on 02/21/2024 with DKA, AKI and L foot wound.   Xray showed possible osteo. Plan for MRI when stable. Only one set of blood cultures collected. It is growing GPC chains. Vanc has been changed to linezolid  for now due to AKI. Possible change to dapto if needed depending on BCID result. Repeating blood cx.   Scr 3.44 (BL<1)  Plan: Vancomycin >>linezolid  600mg  BID Cefepime >>ceftriaxone  2g IV q24 F/U renal fucntion and adjust doses as indicated   Height: 6' (182.9 cm) Weight: 130.6 kg (287 lb 14.7 oz) IBW/kg (Calculated) : 77.6  Temp (24hrs), Avg:95.9 F (35.5 C), Min:93.2 F (34 C), Max:98 F (36.7 C)  Recent Labs  Lab 02/21/24 2130 02/21/24 2134 02/22/24 0111 02/22/24 0326  WBC 25.9*  --   --  27.7*  CREATININE 3.29*  --   --  3.44*  LATICACIDVEN  --  2.5* 2.0*  --     Estimated Creatinine Clearance: 35.1 mL/min (A) (by C-G formula based on SCr of 3.44 mg/dL (H)).    No Known Allergies  7/13 cefepime  x1 7/13 flagyl >> 7/13 vanc x1 7/14 linezolid >>   7/13 blood>>2/2 bottle (1 set) GPC chains   Sergio Batch, PharmD, BCIDP, AAHIVP, CPP Infectious Disease Pharmacist 02/22/2024 9:54 AM

## 2024-02-22 NOTE — Progress Notes (Signed)
 PHARMACY - PHYSICIAN COMMUNICATION CRITICAL VALUE ALERT - BLOOD CULTURE IDENTIFICATION (BCID)  Taylor Hughes is an 53 y.o. male who presented to [facility] on 02/21/2024 with a chief complaint of chronic foot wounds  Assessment: Only one set of blood culture was done. Both bottles came back with group B strep. Team is repeating cultures today. Want to continue ceftriaxone  only for now.   Name of physician (or Provider) Contacted: Dr. Avis  Current antibiotics: Ceftriaxone /linezolid   Changes to prescribed antibiotics recommended:  Dc Linezolid  Continue ceftriaxone  2g IV q24  Results for orders placed or performed during the hospital encounter of 02/21/24  Blood Culture ID Panel (Reflexed) (Collected: 02/21/2024  9:03 PM)  Result Value Ref Range   Enterococcus faecalis NOT DETECTED NOT DETECTED   Enterococcus Faecium NOT DETECTED NOT DETECTED   Listeria monocytogenes NOT DETECTED NOT DETECTED   Staphylococcus species NOT DETECTED NOT DETECTED   Staphylococcus aureus (BCID) NOT DETECTED NOT DETECTED   Staphylococcus epidermidis NOT DETECTED NOT DETECTED   Staphylococcus lugdunensis NOT DETECTED NOT DETECTED   Streptococcus species DETECTED (A) NOT DETECTED   Streptococcus agalactiae DETECTED (A) NOT DETECTED   Streptococcus pneumoniae NOT DETECTED NOT DETECTED   Streptococcus pyogenes NOT DETECTED NOT DETECTED   A.calcoaceticus-baumannii NOT DETECTED NOT DETECTED   Bacteroides fragilis NOT DETECTED NOT DETECTED   Enterobacterales NOT DETECTED NOT DETECTED   Enterobacter cloacae complex NOT DETECTED NOT DETECTED   Escherichia coli NOT DETECTED NOT DETECTED   Klebsiella aerogenes NOT DETECTED NOT DETECTED   Klebsiella oxytoca NOT DETECTED NOT DETECTED   Klebsiella pneumoniae NOT DETECTED NOT DETECTED   Proteus species NOT DETECTED NOT DETECTED   Salmonella species NOT DETECTED NOT DETECTED   Serratia marcescens NOT DETECTED NOT DETECTED   Haemophilus influenzae NOT DETECTED NOT  DETECTED   Neisseria meningitidis NOT DETECTED NOT DETECTED   Pseudomonas aeruginosa NOT DETECTED NOT DETECTED   Stenotrophomonas maltophilia NOT DETECTED NOT DETECTED   Candida albicans NOT DETECTED NOT DETECTED   Candida auris NOT DETECTED NOT DETECTED   Candida glabrata NOT DETECTED NOT DETECTED   Candida krusei NOT DETECTED NOT DETECTED   Candida parapsilosis NOT DETECTED NOT DETECTED   Candida tropicalis NOT DETECTED NOT DETECTED   Cryptococcus neoformans/gattii NOT DETECTED NOT DETECTED    Sergio Batch, PharmD, BCIDP, AAHIVP, CPP Infectious Disease Pharmacist 02/22/2024 11:11 AM

## 2024-02-22 NOTE — Consult Note (Signed)
 WOC Nurse Consult Note: Reason for Consult:Chronic nonhealing wound to left lateral midfoot. Chronic bilateral lymphedema, prior right transmetatarsal amputation, surgical site intact.   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   Chronic edema to lower legs from toes to above knees.  Patient states he is supposed to wear compression, but answer is not clear if he has been doing that at home.  Ortho notes from December indicate progress towards obtaining lymphedema pumps.  Not clear if he has been wearing them.  Pressure Injury POA: NA Measurement: LEft foot 4 cm x 3 cm x 0.6 cm Wound azi:doprx, pale pink nongranulating Drainage (amount, consistency, odor) minimal creamy effluent with musty odor.  Periwound: X ray to left foot suggestive of osteomyellitis.  Awaiting MRI.  Charcot deformity noted, ortho consult pending.  Dressing procedure/placement/frequency: Cleanse wound to left lateral foot with VASHE (LAWSON # [id number]) and pat dry. Apply aquacel to open wound.  Cover with dry gauze.  Unna boot to lower legs to manage edema.   Change twice weekly.  Will not follow at this time.  Please re-consult if needed.  Darice Cooley MSN, RN, FNP-BC CWON Wound, Ostomy, Continence Nurse Outpatient [facility] [phone] Pager [phone]

## 2024-02-22 NOTE — Progress Notes (Signed)
 Pharmacy Antibiotic Note  Taylor Hughes is a 53 y.o. male admitted on 02/21/2024 with DKA, AKI and L foot wound.  Pharmacy has been consulted for Vancomycin  and Cefepime   dosing.  Plan: Vancomycin  1500 mg IV q24h Cefepime  2 g IV q12h F/U renal fucntion and adjust doses as indicated   Height: 6' (182.9 cm) Weight: (!) 156 kg (343 lb 14.7 oz) IBW/kg (Calculated) : 77.6  Temp (24hrs), Avg:98 F (36.7 C), Min:98 F (36.7 C), Max:98 F (36.7 C)  Recent Labs  Lab 02/21/24 2130 02/21/24 2134  WBC 25.9*  --   CREATININE 3.29*  --   LATICACIDVEN  --  2.5*    Estimated Creatinine Clearance: 40.5 mL/min (A) (by C-G formula based on SCr of 3.29 mg/dL (H)).    No Known Allergies  Taylor Hughes 02/22/2024 12:15 AM

## 2024-02-22 NOTE — Progress Notes (Addendum)
 NAME:  Taylor Hughes, MRN:  [id number], DOB:  04-Jun-1971, LOS: 1 ADMISSION DATE:  02/21/2024, CONSULTATION DATE:  02/22/24 REFERRING MD:  EDP, CHIEF COMPLAINT:  AMS   History of Present Illness:  Taylor Hughes is a 53 y.o. M with PMH significant for Atrial Fibrillation, Type 2 DM and prior admissions for DKA, gastroparesis, obesity who presented to the ED with altered mental status and hyperglycemia.  He was last hospitalized for DKA in 11/2023 and was treated for L foot infection as well.   Work-up in the ED consistent with DKA with initial glucose >1000, K 6.9, bicarb <7, creatinine 3.2 and pH of 6.8.   The L foot wound remains present.  He was given 3L of LR, an amp of bicarb, Vanc/cefepime /Flagyl , EKG without  peaked T waves or prolonged qtc.  His mental status remained altered, therefore PCCM consulted for admission  Pertinent  Medical History   has a past medical history of Atrial fibrillation (HCC), Diabetes mellitus without complication (HCC), GERD (gastroesophageal reflux disease), Hyperlipidemia, Hypertension, Morbid (severe) obesity due to excess calories (HCC) (08/07/2023), Wears glasses, and Wound, open, foot.   Significant Hospital Events: Including procedures, antibiotic start and stop dates in addition to other pertinent events   7/13 admit with DKA and encephalopathy  Interim History / Subjective:  Switching IV insulin  site due to concern of patency Small coffee ground appearing emesis this am, pt remains oriented to self, currently denies any further nausea or abd pain  Objective    Blood pressure (!) 116/51, pulse (!) 107, temperature (!) 94.5 F (34.7 C), temperature source Axillary, resp. rate (!) 41, height 6' (1.829 m), weight 130.6 kg, SpO2 99%.        Intake/Output Summary (Last 24 hours) at 02/22/2024 0728 Last data filed at 02/22/2024 0600 Gross per 24 hour  Intake 4951.31 ml  Output 300 ml  Net 4651.31 ml   Filed Weights   02/21/24 2103 02/22/24 0018  Weight:  (!) 156 kg 130.6 kg   General:  ill appearing obese male sitting upright in bed HEENT: MM pink/minimally moist with some residual coffee ground emesis, pupils 3/r,  Neuro: Awake, eyes open, oriented to name, follow simple commands, moves all extremities/ generalized weakness  CV: rr, NSR, no murmur PULM:  kussmaul's, coarse on left, clear right, good cough, remains on RA GI: soft, bs+, denies pain/ tenderness, foley - yellow Extremities: warm/dry, chronic LE lymphedema, prior right transmetatarsal, left foot with dressing to plantar aspect, no erythema/ drainage Skin: no rashes  BMET from 0330> K 5, glucose 964, BUN/ sCr 47/3.29> 46/ 3.44, lactic 2.5> 2  UOP 300 ml/12hrs Net +4.6L    Resolved problem list   Assessment and Plan    Type 2 DM with acute DKA  Acute on chronic L diabetic foot wound, r/o osteomyelitis  Hyperkalemia, 2/2 DKA - changing IV sites where insulin  has been infusing> cont insulin  gtt per endo tool - protecting airway - remains NPO - trending q4hr BMET/ BHA - cont MIVF> consider additional IVF pending UOP/ hemodynamics, currently net +4.6L - check A1c - change vanc to linezolid  given AKI, change to ceftriaxone , and cont flagyl   - follow cultures/ trend clinically  - woc consult - MRI L foot when more metabolically stable as XR suspicious for osteomyelitis of third/ fourth metatarsals/ ortho consult when appropriate    AKI Severe AGMA -cont MIVF, may need additional fluid boluses pending UOP/ renal indices  - trend renal indices  - likely d/c foley  later today  - strict I/Os, daily wts - avoid nephrotoxins, renal dose meds, hemodynamic support as above   Acute Metabolic/ septic Encephalopathy - CTH neg overnight - remains non-focal - cont serial exams/ continue supportive care    Atrial Fibrillation HTN HLD -  remains NSR/ ST, cont monitoring - cont to hold pta BB/ valsartan  due to softer Bps overnight, currently normotensive  - resume statin  when taking POs - optimize electrolytes   Anemia of chronic disease/ IDA GERD Hx gastroparesis - ?coffee ground emesis this morning, small amount - start PPI BID - check T&S, repeat H/H at 1600 - reglan  prn > currently denies any pain or further nausea - hold VTE ppx for now given emesis> transition to SCDs, if H/H stable, may resume - consider GI consult if ongoing emesis  - transfuse for Hgb < 7 - repeat anemia panel    Obesity, BMI 39 - outpt f/u for weight loss management   Best Practice (right click and Reselect all SmartList Selections daily)   Diet/type: NPO DVT prophylaxis prophylactic heparin   Pressure ulcer(s): N/A GI prophylaxis: N/A Lines: N/A Foley:  Yes, and it is still needed Code Status:  full code Last date of multidisciplinary goals of care discussion [pending ]  No family at bedside 7/14 am, pending.   Labs   CBC: Recent Labs  Lab 02/21/24 2130 02/21/24 2134 02/22/24 0326  WBC 25.9*  --  27.7*  NEUTROABS 24.5*  --   --   HGB 9.6* 11.9* 8.6*  HCT 36.4* 35.0* 31.2*  MCV 103.7*  --  100.3*  PLT 487*  --  383    Basic Metabolic Panel: Recent Labs  Lab 02/21/24 2130 02/21/24 2134 02/22/24 0326  NA 134* 134* 136  K 6.9* 7.5* 5.0  CL 100  --  103  CO2 <7*  --  <7*  GLUCOSE 1,076*  --  964*  BUN 47*  --  46*  CREATININE 3.29*  --  3.44*  CALCIUM  9.3  --  8.9   GFR: Estimated Creatinine Clearance: 35.1 mL/min (A) (by C-G formula based on SCr of 3.44 mg/dL (H)). Recent Labs  Lab 02/21/24 2130 02/21/24 2134 02/22/24 0111 02/22/24 0326  WBC 25.9*  --   --  27.7*  LATICACIDVEN  --  2.5* 2.0*  --     Liver Function Tests: Recent Labs  Lab 02/21/24 2130  AST 16  ALT 12  ALKPHOS 112  BILITOT 2.6*  PROT 8.6*  ALBUMIN  2.0*   No results for input(s): LIPASE, AMYLASE in the last 168 hours. No results for input(s): AMMONIA in the last 168 hours.  ABG    Component Value Date/Time   HCO3 4.2 (L) 02/22/2024 0317   TCO2  <5 (L) 02/21/2024 2134   ACIDBASEDEF 25.2 (H) 02/22/2024 0317   O2SAT 87.3 02/22/2024 0317     Coagulation Profile: Recent Labs  Lab 02/21/24 2130  INR 1.6*    Cardiac Enzymes: No results for input(s): CKTOTAL, CKMB, CKMBINDEX, TROPONINI in the last 168 hours.  HbA1C: HbA1c, POC (controlled diabetic range)  Date/Time Value Ref Range Status  06/25/2023 03:29 PM 7.9 (A) 0.0 - 7.0 % Final  01/19/2023 11:26 AM 9.7 (A) 0.0 - 7.0 % Final   Hgb A1c MFr Bld  Date/Time Value Ref Range Status  11/11/2023 03:19 AM 9.1 (H) 4.8 - 5.6 % Final    Comment:    (NOTE) Pre diabetes:          5.7%-6.4%  Diabetes:              >6.4%  Glycemic control for   <7.0% adults with diabetes   03/23/2023 12:05 AM 9.8 (H) 4.8 - 5.6 % Final    Comment:    (NOTE)         Prediabetes: 5.7 - 6.4         Diabetes: >6.4         Glycemic control for adults with diabetes: <7.0     CBG: Recent Labs  Lab 02/22/24 0501 02/22/24 0545 02/22/24 0619 02/22/24 0702 02/22/24 0725  GLUCAP >600* >600* >600* >600* >600*    Allergies No Known Allergies   Home Medications  Prior to Admission medications   Medication Sig Start Date End Date Taking? Authorizing Provider  Accu-Chek Softclix Lancets lancets Use to check blood sugar 3 times daily. 02/09/24   Newlin, Enobong, MD  atorvastatin  (LIPITOR) 20 MG tablet Take 1 tablet (20 mg total) by mouth daily. 12/03/23   Danton Jon HERO, PA-C  Blood Glucose Monitoring Suppl (ACCU-CHEK GUIDE) w/Device KIT Use to check blood sugar 3 times daily. 06/02/23   Newlin, Enobong, MD  Continuous Glucose Receiver (FREESTYLE LIBRE 3 READER) DEVI Use as directed  every 14 (fourteen) days. Patient not taking: Reported on 12/17/2023 12/03/23   Danton Jon HERO, PA-C  Continuous Glucose Sensor (FREESTYLE LIBRE 3 SENSOR) MISC Use as directed .Place 1 sensor on the skin every 14 days. Use to check glucose continuously Patient not taking: Reported on 12/17/2023 12/03/23    Danton Jon HERO, PA-C  gabapentin  (NEURONTIN ) 300 MG capsule Take 1 capsule (300 mg total) by mouth 2 (two) times daily. 10/06/23   Newlin, Enobong, MD  glucose blood (ACCU-CHEK GUIDE TEST) test strip use as directed 3 (three) times daily. 06/02/23   Newlin, Enobong, MD  glucose blood (ACCU-CHEK GUIDE) test strip Use to check blood sugar 3 times daily. 06/02/23   Newlin, Enobong, MD  insulin  glargine (LANTUS  SOLOSTAR) 100 UNIT/ML Solostar Pen Inject 27 Units into the skin daily. 02/04/24   Newlin, Enobong, MD  insulin  lispro (HUMALOG  KWIKPEN) 100 UNIT/ML KwikPen Inject 0-12 units into the skin three times daily with meals. Per sliding scale. 01/22/24   Newlin, Enobong, MD  Insulin  Pen Needle (TECHLITE PEN NEEDLES) 32G X 6 MM MISC USE AS DIRECTED 01/22/24   Newlin, Enobong, MD  Menthol , Topical Analgesic, (ICY HOT BACK) 5 % PTCH Apply 1 patch topically daily. Patient not taking: Reported on 12/17/2023 11/25/23   Newlin, Enobong, MD  metoprolol  tartrate (LOPRESSOR ) 50 MG tablet Take 1 tablet (50 mg total) by mouth 2 (two) times daily. 01/22/24   Newlin, Enobong, MD  Misc. Devices MISC Blood pressure monitor.  Diagnosis-hypertension. 12/25/23   Newlin, Enobong, MD  naloxone  (NARCAN ) nasal spray 4 mg/0.1 mL USE 1 SPRAY ON 1 NOSTRIL ONCE, MAY REPEAT DOSE EVERY 2 TO 3 MINUTES UNTIL REPSONSIVE OR EMS ARRIVE FOR AN OPIOID EMERGENCY. Patient not taking: Reported on 12/17/2023 11/10/23     naloxone  (NARCAN ) nasal spray 4 mg/0.1 mL Place 1 spray into one nostril as needed for opioid emergency. May repeat every 2-3 min until responsive or EMS  arrives. 12/01/23     omeprazole  (PRILOSEC) 40 MG capsule Take 1 capsule (40 mg total) by mouth daily. 12/03/23   Danton Jon HERO, PA-C  sildenafil  (VIAGRA ) 100 MG tablet Take 1 tablet (100 mg total) by mouth daily as needed for erectile dysfunction. At least 24 hours between doses 01/22/24  Newlin, Enobong, MD  valsartan  (DIOVAN ) 160 MG tablet Take 1 tablet (160 mg total) by mouth  daily. 12/03/23   Danton Jon HERO, PA-C  furosemide  (LASIX ) 20 MG tablet Take 2 tablets (40 mg total) by mouth daily. Patient not taking: Reported on 08/07/2023 05/14/23 11/04/23  Newlin, Enobong, MD     Critical care time:  38 minutes       Lyle Pesa, MSN, AG-ACNP-BC [facility] 02/22/2024, 7:28 AM  See Amion for pager If no response to pager , please call [phone] until 7pm After 7:00 pm call Elink  [phone]

## 2024-02-22 NOTE — ED Notes (Signed)
 Pt taken to CT before going to the ICU.

## 2024-02-23 ENCOUNTER — Inpatient Hospital Stay (HOSPITAL_COMMUNITY)

## 2024-02-23 DIAGNOSIS — G9341 Metabolic encephalopathy: Secondary | ICD-10-CM | POA: Diagnosis not present

## 2024-02-23 DIAGNOSIS — I38 Endocarditis, valve unspecified: Secondary | ICD-10-CM

## 2024-02-23 DIAGNOSIS — K920 Hematemesis: Secondary | ICD-10-CM

## 2024-02-23 DIAGNOSIS — N179 Acute kidney failure, unspecified: Secondary | ICD-10-CM | POA: Diagnosis not present

## 2024-02-23 DIAGNOSIS — E1110 Type 2 diabetes mellitus with ketoacidosis without coma: Secondary | ICD-10-CM | POA: Diagnosis not present

## 2024-02-23 DIAGNOSIS — I4891 Unspecified atrial fibrillation: Secondary | ICD-10-CM | POA: Diagnosis not present

## 2024-02-23 LAB — PHOSPHORUS: Phosphorus: <1 mg/dL — CL (ref 2.5–4.6)

## 2024-02-23 LAB — GLUCOSE, CAPILLARY
Glucose-Capillary: 138 mg/dL — ABNORMAL HIGH (ref 70–99)
Glucose-Capillary: 138 mg/dL — ABNORMAL HIGH (ref 70–99)
Glucose-Capillary: 142 mg/dL — ABNORMAL HIGH (ref 70–99)
Glucose-Capillary: 145 mg/dL — ABNORMAL HIGH (ref 70–99)
Glucose-Capillary: 150 mg/dL — ABNORMAL HIGH (ref 70–99)
Glucose-Capillary: 152 mg/dL — ABNORMAL HIGH (ref 70–99)
Glucose-Capillary: 165 mg/dL — ABNORMAL HIGH (ref 70–99)
Glucose-Capillary: 166 mg/dL — ABNORMAL HIGH (ref 70–99)
Glucose-Capillary: 178 mg/dL — ABNORMAL HIGH (ref 70–99)
Glucose-Capillary: 258 mg/dL — ABNORMAL HIGH (ref 70–99)
Glucose-Capillary: 289 mg/dL — ABNORMAL HIGH (ref 70–99)

## 2024-02-23 LAB — BASIC METABOLIC PANEL WITH GFR
Anion gap: 12 (ref 5–15)
Anion gap: 13 (ref 5–15)
BUN: 46 mg/dL — ABNORMAL HIGH (ref 6–20)
BUN: 43 mg/dL — ABNORMAL HIGH (ref 6–20)
CO2: 18 mmol/L — ABNORMAL LOW (ref 22–32)
CO2: 18 mmol/L — ABNORMAL LOW (ref 22–32)
Calcium: 9.2 mg/dL (ref 8.9–10.3)
Calcium: 9.2 mg/dL (ref 8.9–10.3)
Chloride: 112 mmol/L — ABNORMAL HIGH (ref 98–111)
Chloride: 111 mmol/L (ref 98–111)
Creatinine, Ser: 1.97 mg/dL — ABNORMAL HIGH (ref 0.61–1.24)
Creatinine, Ser: 1.7 mg/dL — ABNORMAL HIGH (ref 0.61–1.24)
GFR, Estimated: 40 mL/min — ABNORMAL LOW (ref >60)
GFR, Estimated: 48 mL/min — ABNORMAL LOW (ref >60)
Glucose, Bld: 169 mg/dL — ABNORMAL HIGH (ref 70–99)
Glucose, Bld: 144 mg/dL — ABNORMAL HIGH (ref 70–99)
Potassium: 3.8 mmol/L (ref 3.5–5.1)
Potassium: 4.2 mmol/L (ref 3.5–5.1)
Sodium: 142 mmol/L (ref 135–145)
Sodium: 142 mmol/L (ref 135–145)

## 2024-02-23 LAB — HEMOGLOBIN A1C
Hgb A1c MFr Bld: 10.9 % — ABNORMAL HIGH (ref 4.8–5.6)
Mean Plasma Glucose: 266 mg/dL

## 2024-02-23 LAB — CBC
HCT: 26.9 % — ABNORMAL LOW (ref 39.0–52.0)
Hemoglobin: 8.7 g/dL — ABNORMAL LOW (ref 13.0–17.0)
MCH: 27.1 pg (ref 26.0–34.0)
MCHC: 32.3 g/dL (ref 30.0–36.0)
MCV: 83.8 fL (ref 80.0–100.0)
Platelets: 375 K/uL (ref 150–400)
RBC: 3.21 MIL/uL — ABNORMAL LOW (ref 4.22–5.81)
RDW: 17.4 % — ABNORMAL HIGH (ref 11.5–15.5)
WBC: 23.7 K/uL — ABNORMAL HIGH (ref 4.0–10.5)
nRBC: 0.4 % — ABNORMAL HIGH (ref 0.0–0.2)

## 2024-02-23 LAB — ECHOCARDIOGRAM COMPLETE
AR max vel: 2.74 cm2
AV Area VTI: 4.15 cm2
AV Area mean vel: 2.77 cm2
AV Mean grad: 5 mmHg
AV Peak grad: 10.8 mmHg
Ao pk vel: 1.64 m/s
Area-P 1/2: 5.66 cm2
Height: 72 in
S' Lateral: 3.3 cm
Weight: 4768.99 [oz_av]

## 2024-02-23 LAB — MAGNESIUM: Magnesium: 2.3 mg/dL (ref 1.7–2.4)

## 2024-02-23 MED ORDER — METOPROLOL TARTRATE 25 MG PO TABS
25.0000 mg | ORAL_TABLET | Freq: Two times a day (BID) | ORAL | Status: DC
  Administered 2024-02-23 – 2024-02-25 (×5): 25 mg via ORAL
  Filled 2024-02-23 (×5): qty 1

## 2024-02-23 MED ORDER — LACTATED RINGERS IV SOLN: INTRAVENOUS | Status: DC

## 2024-02-23 MED ORDER — ALUM & MAG HYDROXIDE-SIMETH 200-200-20 MG/5ML PO SUSP
30.0000 mL | Freq: Once | ORAL | Status: AC
  Administered 2024-02-23: 30 mL via ORAL
  Filled 2024-02-23: qty 30

## 2024-02-23 MED ORDER — PERFLUTREN LIPID MICROSPHERE
1.0000 mL | INTRAVENOUS | Status: AC | PRN
  Administered 2024-02-23: 2 mL via INTRAVENOUS

## 2024-02-23 MED ORDER — INSULIN ASPART 100 UNIT/ML IJ SOLN
0.0000 [IU] | Freq: Every day | INTRAMUSCULAR | Status: DC
  Administered 2024-02-23: 3 [IU] via SUBCUTANEOUS
  Administered 2024-02-25: 2 [IU] via SUBCUTANEOUS
  Administered 2024-02-26: 5 [IU] via SUBCUTANEOUS

## 2024-02-23 MED ORDER — SODIUM PHOSPHATES 45 MMOLE/15ML IV SOLN
30.0000 mmol | Freq: Once | INTRAVENOUS | Status: AC
  Administered 2024-02-23: 30 mmol via INTRAVENOUS
  Filled 2024-02-23: qty 10

## 2024-02-23 MED ORDER — INSULIN ASPART 100 UNIT/ML IJ SOLN
0.0000 [IU] | Freq: Three times a day (TID) | INTRAMUSCULAR | Status: DC
  Administered 2024-02-23: 5 [IU] via SUBCUTANEOUS
  Administered 2024-02-23: 1 [IU] via SUBCUTANEOUS
  Administered 2024-02-24: 2 [IU] via SUBCUTANEOUS
  Administered 2024-02-24: 7 [IU] via SUBCUTANEOUS
  Administered 2024-02-24 – 2024-02-25 (×2): 3 [IU] via SUBCUTANEOUS
  Administered 2024-02-25: 2 [IU] via SUBCUTANEOUS
  Administered 2024-02-25: 3 [IU] via SUBCUTANEOUS
  Administered 2024-02-26: 5 [IU] via SUBCUTANEOUS
  Administered 2024-02-26: 3 [IU] via SUBCUTANEOUS

## 2024-02-23 MED ORDER — LIDOCAINE VISCOUS HCL 2 % MT SOLN
15.0000 mL | Freq: Once | OROMUCOSAL | Status: AC
  Administered 2024-02-23: 15 mL via ORAL
  Filled 2024-02-23: qty 15

## 2024-02-23 MED ORDER — JUVEN PO PACK
1.0000 | PACK | Freq: Two times a day (BID) | ORAL | Status: DC
  Administered 2024-02-27 – 2024-03-04 (×14): 1 via ORAL
  Filled 2024-02-23 (×15): qty 1

## 2024-02-23 MED ORDER — HEPARIN SODIUM (PORCINE) 5000 UNIT/ML IJ SOLN
5000.0000 [IU] | Freq: Three times a day (TID) | INTRAMUSCULAR | Status: DC
  Administered 2024-02-23 – 2024-02-26 (×9): 5000 [IU] via SUBCUTANEOUS
  Filled 2024-02-23 (×9): qty 1

## 2024-02-23 MED ORDER — ATORVASTATIN CALCIUM 10 MG PO TABS
20.0000 mg | ORAL_TABLET | Freq: Every day | ORAL | Status: DC
  Administered 2024-02-23 – 2024-03-03 (×10): 20 mg via ORAL
  Filled 2024-02-23 (×10): qty 2

## 2024-02-23 MED ORDER — DEXTROSE IN LACTATED RINGERS 5 % IV SOLN: INTRAVENOUS | Status: DC

## 2024-02-23 MED ORDER — METOCLOPRAMIDE HCL 5 MG/ML IJ SOLN
5.0000 mg | Freq: Three times a day (TID) | INTRAMUSCULAR | Status: AC
  Administered 2024-02-23 – 2024-02-25 (×6): 5 mg via INTRAVENOUS
  Filled 2024-02-23 (×5): qty 2

## 2024-02-23 MED ORDER — INSULIN GLARGINE-YFGN 100 UNIT/ML ~~LOC~~ SOLN
20.0000 [IU] | Freq: Two times a day (BID) | SUBCUTANEOUS | Status: DC
  Administered 2024-02-23 – 2024-02-26 (×7): 20 [IU] via SUBCUTANEOUS
  Filled 2024-02-23 (×11): qty 0.2

## 2024-02-23 MED ORDER — ADULT MULTIVITAMIN W/MINERALS CH
1.0000 | ORAL_TABLET | Freq: Every day | ORAL | Status: DC
  Administered 2024-02-23 – 2024-03-04 (×10): 1 via ORAL
  Filled 2024-02-23 (×10): qty 1

## 2024-02-23 NOTE — Progress Notes (Signed)
 Orthopedic Tech Progress Note Patient Details:  Taylor Hughes 1970/10/20 [id number]  Ortho Devices Type of Ortho Device: Ace wrap, Unna boot Ortho Device/Splint Location: BLE Ortho Device/Splint Interventions: Ordered, Application, Adjustment   Post Interventions Patient Tolerated: Well Instructions Provided: Care of device  Delanna LITTIE Pac 02/23/2024, 10:18 AM

## 2024-02-23 NOTE — Progress Notes (Signed)
 Pt back from MRI but did not complete full test, only without, due to pt wanting to stop remainder of test due to load noise and being uncomfortable.    CBG stable thus far off insulin  gtt, remains hemodynamically stable and awake, oriented.  Does c/o of no appetite, sore throat and ongoing reflux type symptoms.  Will schedule reglan  and try maalox, pending further GI recs.  Will transfer out of ICU and to [facility] for pick up on 7/16.      Lyle Pesa, MSN, AG-ACNP-BC [facility] 02/23/2024, 2:54 PM  See Amion for pager If no response to pager , please call [phone] until 7pm After 7:00 pm call Elink  [phone]

## 2024-02-23 NOTE — Progress Notes (Signed)
 Patient transferred to 3W71.  Report given to [facility], RN and patient left in room with bed locked and in lowest position.  Patient had his cell phone with him.  Odis, RN received the patient at the bedside

## 2024-02-23 NOTE — Progress Notes (Signed)
 Initial Nutrition Assessment  DOCUMENTATION CODES:   Obesity unspecified  INTERVENTION:  Monitor for diet tolerance and further advancement past clear liquid diet Juven BID to support wound healing MVI with minerals daily Address nutrition related concerns on follow up once diet order more nutritionally adequate  NUTRITION DIAGNOSIS:  Inadequate oral intake related to chronic illness (gastroparesis and uncontrolled diabetes) as evidenced by per patient/family report, energy intake < or equal to 75% for > or equal to 1 month.  GOAL:   Patient will meet greater than or equal to 90% of their needs  MONITOR:   Diet advancement, Labs, Weight trends  REASON FOR ASSESSMENT:   Consult Assessment of nutrition requirement/status  ASSESSMENT:   Pt admitted with DKA (initial glucose >1000) and ongoing L foot wound. PMH significant for recent admission  (11/2023) for DKA and L foot wound treatment, afib, DM2 and gastroparesis.  Spoke with patient at bedside. He reports that he has had a very poor appetite since prior to his last admission in April. He mentions that he has not been consuming many solid foods except fruit such as strawberries and other various berries. He is unable to determine what his poor appetite has been attributed to. Suspects it is multifactorial including diabetes and gastroparesis. He mentions that he is familiar with diet recommendations for gastroparesis and diabetes.   Given pt had still remained NPO at time of visit, discussed with patient plan to follow up and address ongoing nutrition needs.   He is uncertain of whether he has encountered any weight loss as he does not weigh himself consistently.   Documented weight history reflects lots of variability in documentation within short time frames which could in part be r/t lymphedema. Unable to reliably calculate weight loss at this time.   Unable to confirm diagnosis of malnutrition at this time however do  suspect there is an underlying degree given inadequate oral intake. Will continue to monitor throughout admission.   Medications: SSI 0-5 units at bedtime, SSI 0-9 units TID, semglee  20 units BID, reglan  q8h, IV abx  Labs:  BUN 43 Cr 1.70 Phos <1.0 (repletion ordered) CBG's 138-258 x24 hours HgbA1c 10.9% (02/22/24)  NUTRITION - FOCUSED PHYSICAL EXAM: Flowsheet Row Most Recent Value  Orbital Region Mild depletion  Upper Arm Region No depletion  Thoracic and Lumbar Region No depletion  Buccal Region No depletion  Temple Region No depletion  Clavicle Bone Region Mild depletion  Clavicle and Acromion Bone Region No depletion  Scapular Bone Region No depletion  Dorsal Hand Mild depletion  Patellar Region Unable to assess  [lymphedema]  Anterior Thigh Region Unable to assess  Posterior Calf Region Unable to assess  Edema (RD Assessment) Severe  [BLE]  Hair Reviewed  Eyes Reviewed  Mouth Reviewed  Skin Reviewed  Nails Reviewed    Diet Order:   Diet Order             Diet clear liquid Room service appropriate? Yes; Fluid consistency: Thin  Diet effective now                   EDUCATION NEEDS:   Not appropriate for education at this time  Skin:  Skin Assessment: Skin Integrity Issues: Skin Integrity Issues:: Diabetic Ulcer Diabetic Ulcer: L heel  Last BM:  7/15 type 7 medium  Height:   Ht Readings from Last 1 Encounters:  02/21/24 6' (1.829 m)    Weight:   Wt Readings from Last 1 Encounters:  02/23/24 135.2  kg    Ideal Body Weight:  80.9 kg  BMI:  Body mass index is 40.42 kg/m.  Estimated Nutritional Needs:   Kcal:  2200-2400  Protein:  120-135g  Fluid:  >/=2L  Royce Maris, RDN, LDN Clinical Nutrition See AMiON for contact information.

## 2024-02-23 NOTE — Progress Notes (Signed)
 NAME:  Taylor Hughes, MRN:  [id number], DOB:  [birth year], LOS: 2 ADMISSION DATE:  02/21/2024, CONSULTATION DATE:  02/23/24 REFERRING MD:  EDP, CHIEF COMPLAINT:  AMS   History of Present Illness:  Taylor Hughes is a 53 y.o. M with PMH significant for Atrial Fibrillation, Type 2 DM and prior admissions for DKA, gastroparesis, obesity who presented to the ED with altered mental status and hyperglycemia.  He was last hospitalized for DKA in 11/2023 and was treated for L foot infection as well.   Work-up in the ED consistent with DKA with initial glucose >1000, K 6.9, bicarb <7, creatinine 3.2 and pH of 6.8.   The L foot wound remains present.  He was given 3L of LR, an amp of bicarb, Vanc/cefepime /Flagyl , EKG without  peaked T waves or prolonged qtc.  His mental status remained altered, therefore PCCM consulted for admission  Pertinent  Medical History   has a past medical history of Atrial fibrillation (HCC), Diabetes mellitus without complication (HCC), GERD (gastroesophageal reflux disease), Hyperlipidemia, Hypertension, Morbid (severe) obesity due to excess calories (HCC) (08/07/2023), Wears glasses, and Wound, open, foot.   Significant Hospital Events: Including procedures, antibiotic start and stop dates in addition to other pertinent events   7/13 admit with DKA and encephalopathy  Interim History / Subjective:  2 more coffee ground emesis last evening.   Denies any nausea or abd pain, does not take NSAIDs regularly.  Few hiccups this am   Objective    Blood pressure (!) 152/93, pulse 99, temperature 97.9 F (36.6 C), temperature source Oral, resp. rate 18, height 6' (1.829 m), weight 135.2 kg, SpO2 99%.        Intake/Output Summary (Last 24 hours) at 02/23/2024 0741 Last data filed at 02/23/2024 9282 Gross per 24 hour  Intake 5507.47 ml  Output 1260 ml  Net 4247.47 ml   Filed Weights   02/21/24 2103 02/22/24 0018 02/23/24 0500  Weight: (!) 156 kg 130.6 kg 135.2 kg   General:  AoC  ill appearing adult male lying in bed in NAD HEENT: MM pink/moist, pupils 3/r, no JVD Neuro:  initially very somnolent with delayed slowed responses only verbal to name but then with more stimulation, oriented to person, place, time, f/c, MAE- generalized weakness  CV: rr, SR, no murmur, +2 pulses PULM:  non labored, clear anteriorly, on RA GI: obese, soft, bs+, NT, ND, foley - cyu Extremities: warm/dry, chronic LE lymphedema, dressing to left foot cdi, prior R foot transmetatarsal amputation  Skin: no rashes    Afebrile  Labs> WBC 26> 23.7, H/H 8.6/ 28> 9.7/ 30.5> 8.7/ 26.9, K 4.2, bicarb 18, BUN/ sCr 46/ 1.97> 43/ 1.7, Mag 2.3 Phos <1 overnight getting replete now  UOP 1.2L/ /24hrs Net +8.8L    Resolved problem list  Hyperkalemia  Assessment and Plan    Type 2 DM with acute DKA  - A1c pending - BHA normalized - trending BMETs - cont MIVF for now till able to start/ tolerate PO's off insulin  gtt - AG closed, transiting off insulin  gtt over next several hours  - pending DM coordinator consult   Strep agalactiae bacteremia  Acute on chronic L diabetic foot wound, r/o osteomyelitis  - cont IV ceftriaxone  - follow blood cultures - pending echo to evaluate for IE - mental status improved and stable for MRI of left foot then get ortho consult - appreciate WOC recs/ unna boots   AKI, 2/2 dehydration/ sepsis,  improving  Severe AGMA> AG closed,  ongoing NAGMA hypophos - cont MIVF till taking POs - d/c foley, monitor for retention - recheck phos q12, getting replete now - trend renal indices  - strict I/Os, daily wts - avoid nephrotoxins, renal dose meds, hemodynamic support as above    Acute Metabolic/ septic Encephalopathy - CTH neg  - improving mental status with correction of metabolic derangements, remains non-focal - cont serial exams/ continue supportive care/ limit sedating meds   Atrial Fibrillation HTN HLD -  cont to remain in NSR/ ST - SBP 120-170's,  add pta lopressor  at reduced dose for now> 25mg  BID, prn IV lopressor  if needed - cont to hold valsartan  - resume statin  - optimize electrolytes   Anemia of chronic disease/ IDA GERD Hx gastroparesis - suspect he gets hiccups then with reflux hx> has small amounts of emesis - H/H remains stable - cont PPI BID, will schedule reglan  and prn zofran  - [facility] non urgently consulted - transfuse for Hgb < 7 - ferritin high, iron and TIBC low.  No Fe infusions with acute sepsis  Obesity, BMI 39 Suspect component of malnutrition - 7 Kg wt loss since April on EMR review - RD consult wound healing, DKA, maximize nutrition as able    Best Practice (right click and Reselect all SmartList Selections daily)   Diet/type: NPO; pending swallow eval> start clears  DVT prophylaxis SCD Pressure ulcer(s): N/A GI prophylaxis: N/A Lines: N/A Foley:  Yes, and it is no longer needed and removal ordered  Code Status:  full code Last date of multidisciplinary goals of care discussion [pending ]  Pt updated on plan of care 7/15, no family at bedside 7/15.    Labs   CBC: Recent Labs  Lab 02/21/24 2130 02/21/24 2134 02/22/24 0326 02/22/24 1030 02/22/24 1814 02/23/24 0241  WBC 25.9*  --  27.7* 26.0*  --  23.7*  NEUTROABS 24.5*  --   --   --   --   --   HGB 9.6* 11.9* 8.6* 8.6* 9.7* 8.7*  HCT 36.4* 35.0* 31.2* 28.3* 30.5* 26.9*  MCV 103.7*  --  100.3* 90.1  --  83.8  PLT 487*  --  383 396  --  375    Basic Metabolic Panel: Recent Labs  Lab 02/22/24 1030 02/22/24 1421 02/22/24 1814 02/22/24 2209 02/23/24 0241  NA 138 138 142 142 142  K 4.1 3.6 3.3* 3.5 3.8  CL 103 103 110 111 112*  CO2 12* 15* 16* 17* 18*  GLUCOSE 769* 615* 399* 181* 169*  BUN 49* 50* 50* 48* 46*  CREATININE 3.14* 2.94* 2.72* 2.38* 1.97*  CALCIUM  9.0 9.3 9.2 9.3 9.2  MG 2.6*  --   --   --   --   PHOS 1.6*  --   --   --  <1.0*   GFR: Estimated Creatinine Clearance: 62.4 mL/min (A) (by C-G formula based on  SCr of 1.97 mg/dL (H)). Recent Labs  Lab 02/21/24 2130 02/21/24 2134 02/22/24 0111 02/22/24 0326 02/22/24 1030 02/23/24 0241  WBC 25.9*  --   --  27.7* 26.0* 23.7*  LATICACIDVEN  --  2.5* 2.0*  --   --   --     Liver Function Tests: Recent Labs  Lab 02/21/24 2130 02/22/24 1030  AST 16 11*  ALT 12 10  ALKPHOS 112 85  BILITOT 2.6* 1.7*  PROT 8.6* 7.5  ALBUMIN  2.0* 1.7*   No results for input(s): LIPASE, AMYLASE in the last 168 hours. No results  for input(s): AMMONIA in the last 168 hours.  ABG    Component Value Date/Time   HCO3 4.2 (L) 02/22/2024 0317   TCO2 <5 (L) 02/21/2024 2134   ACIDBASEDEF 25.2 (H) 02/22/2024 0317   O2SAT 87.3 02/22/2024 0317     Coagulation Profile: Recent Labs  Lab 02/21/24 2130  INR 1.6*    Cardiac Enzymes: No results for input(s): CKTOTAL, CKMB, CKMBINDEX, TROPONINI in the last 168 hours.  HbA1C: HbA1c, POC (controlled diabetic range)  Date/Time Value Ref Range Status  06/25/2023 03:29 PM 7.9 (A) 0.0 - 7.0 % Final  01/19/2023 11:26 AM 9.7 (A) 0.0 - 7.0 % Final   Hgb A1c MFr Bld  Date/Time Value Ref Range Status  11/11/2023 03:19 AM 9.1 (H) 4.8 - 5.6 % Final    Comment:    (NOTE) Pre diabetes:          5.7%-6.4%  Diabetes:              >6.4%  Glycemic control for   <7.0% adults with diabetes   03/23/2023 12:05 AM 9.8 (H) 4.8 - 5.6 % Final    Comment:    (NOTE)         Prediabetes: 5.7 - 6.4         Diabetes: >6.4         Glycemic control for adults with diabetes: <7.0     CBG: Recent Labs  Lab 02/23/24 0227 02/23/24 0329 02/23/24 0442 02/23/24 0617 02/23/24 0713  GLUCAP 178* 166* 138* 150* 138*    Allergies No Known Allergies   Home Medications  Prior to Admission medications   Medication Sig Start Date End Date Taking? Authorizing Provider  Accu-Chek Softclix Lancets lancets Use to check blood sugar 3 times daily. 02/09/24   Newlin, Enobong, MD  atorvastatin  (LIPITOR) 20 MG tablet Take 1  tablet (20 mg total) by mouth daily. 12/03/23   Danton Jon HERO, PA-C  Blood Glucose Monitoring Suppl (ACCU-CHEK GUIDE) w/Device KIT Use to check blood sugar 3 times daily. 06/02/23   Newlin, Enobong, MD  Continuous Glucose Receiver (FREESTYLE LIBRE 3 READER) DEVI Use as directed  every 14 (fourteen) days. Patient not taking: Reported on 12/17/2023 12/03/23   Danton Jon HERO, PA-C  Continuous Glucose Sensor (FREESTYLE LIBRE 3 SENSOR) MISC Use as directed .Place 1 sensor on the skin every 14 days. Use to check glucose continuously Patient not taking: Reported on 12/17/2023 12/03/23   Danton Jon HERO, PA-C  gabapentin  (NEURONTIN ) 300 MG capsule Take 1 capsule (300 mg total) by mouth 2 (two) times daily. 10/06/23   Newlin, Enobong, MD  glucose blood (ACCU-CHEK GUIDE TEST) test strip use as directed 3 (three) times daily. 06/02/23   Newlin, Enobong, MD  glucose blood (ACCU-CHEK GUIDE) test strip Use to check blood sugar 3 times daily. 06/02/23   Newlin, Enobong, MD  insulin  glargine (LANTUS  SOLOSTAR) 100 UNIT/ML Solostar Pen Inject 27 Units into the skin daily. 02/04/24   Newlin, Enobong, MD  insulin  lispro (HUMALOG  KWIKPEN) 100 UNIT/ML KwikPen Inject 0-12 units into the skin three times daily with meals. Per sliding scale. 01/22/24   Newlin, Enobong, MD  Insulin  Pen Needle (TECHLITE PEN NEEDLES) 32G X 6 MM MISC USE AS DIRECTED 01/22/24   Newlin, Enobong, MD  Menthol , Topical Analgesic, (ICY HOT BACK) 5 % PTCH Apply 1 patch topically daily. Patient not taking: Reported on 12/17/2023 11/25/23   Newlin, Enobong, MD  metoprolol  tartrate (LOPRESSOR ) 50 MG tablet Take 1 tablet (50 mg  total) by mouth 2 (two) times daily. 01/22/24   Newlin, Enobong, MD  Misc. Devices MISC Blood pressure monitor.  Diagnosis-hypertension. 12/25/23   Newlin, Enobong, MD  naloxone  (NARCAN ) nasal spray 4 mg/0.1 mL USE 1 SPRAY ON 1 NOSTRIL ONCE, MAY REPEAT DOSE EVERY 2 TO 3 MINUTES UNTIL REPSONSIVE OR EMS ARRIVE FOR AN OPIOID  EMERGENCY. Patient not taking: Reported on 12/17/2023 11/10/23     naloxone  (NARCAN ) nasal spray 4 mg/0.1 mL Place 1 spray into one nostril as needed for opioid emergency. May repeat every 2-3 min until responsive or EMS  arrives. 12/01/23     omeprazole  (PRILOSEC) 40 MG capsule Take 1 capsule (40 mg total) by mouth daily. 12/03/23   Danton Jon HERO, PA-C  sildenafil  (VIAGRA ) 100 MG tablet Take 1 tablet (100 mg total) by mouth daily as needed for erectile dysfunction. At least 24 hours between doses 01/22/24   Newlin, Enobong, MD  valsartan  (DIOVAN ) 160 MG tablet Take 1 tablet (160 mg total) by mouth daily. 12/03/23   Danton Jon HERO, PA-C  furosemide  (LASIX ) 20 MG tablet Take 2 tablets (40 mg total) by mouth daily. Patient not taking: Reported on 08/07/2023 05/14/23 11/04/23  Newlin, Enobong, MD     Critical care time:  35 minutes       Lyle Pesa, MSN, AG-ACNP-BC [facility] 02/23/2024, 7:41 AM  See Amion for pager If no response to pager , please call [phone] until 7pm After 7:00 pm call Elink  [phone]

## 2024-02-23 NOTE — Consult Note (Signed)
 Inpatient Consultation   Referring Provider:      Primary Care Physician:  Delbert Clam, MD Primary Gastroenterologist:       Dr. Shila  Reason for Consultation:     Coffee-ground emesis         HPI  Taylor Hughes is a 53 y.o. male with a past medical history noteworthy for atrial fibrillation, T2DM, HLD, HTN, morbid obesity, gastroparesis admitted with altered mental status, hyperglycemia and DKA who is seen in inpatient consultation for evaluation and management of coffee-ground emesis.  Upon admission to the ED initial blood glucose was greater than 1000, K6.9, bicarb less than 7, creatinine 3.2 and pH 6.8.  Since that time has been hydrated and mental status is improved.    In the setting of DKA had episodes of nausea and vomiting.  Emesis has been dark-no bright red blood or witnessed hematemesis.  States that he takes ibuprofen  at home intermittently for headaches but not on a chronic basis.  Denies prior history of gastrointestinal bleeding.  No prior history of peptic ulcer disease or gastritis.  Currently denies nausea or abdominal pain.  Not on anticoagulation. Hemoglobin trend 8.6 --> 9.7 --> 8.7 since admission.  Hemoglobin 3 months ago was 10.1.  Gastric emptying scan 08/2022 showed delayed gastric emptying study at 3 and 4 hours. More normal at 1 and 2 hours.  Records indicate that patient previously took Reglan  on a scheduled basis and later changed to as needed.  At the time of my interview he had just had another episode of coffee-ground emesis as his nurse was moving him in bed.  Bowel movement was witnessed last night in the hospital noted to be Kol[place].  Patient has been hemodynamically stable.    Past Medical History:  Diagnosis Date   Atrial fibrillation (HCC)    Diabetes mellitus without complication (HCC)    Type 2   GERD (gastroesophageal reflux disease)    Hyperlipidemia    Hypertension    Morbid (severe) obesity due to excess calories (HCC) 08/07/2023    bmi 44.63   Wears glasses    Wound, open, foot    left diabetic     Past Surgical History:  Procedure Laterality Date   AMPUTATION Left 08/20/2018   Procedure: LEFT FOOT IRRIGATON AND DEBRIDEMENT, 5TH RAY AMPUTATION;  Surgeon: Harden Jerona GAILS, MD;  Location: [facility] OR;  Service: Orthopedics;  Laterality: Left;   AMPUTATION Right 03/25/2023   Procedure: RIGHT TRANSMETATARSAL AMPUTATION;  Surgeon: Harden Jerona GAILS, MD;  Location: [facility] OR;  Service: Orthopedics;  Laterality: Right;   APPLICATION OF A-CELL OF CHEST/ABDOMEN N/A 05/30/2019   Procedure: Application Of A-Cell Of Chest;  Surgeon: Shyrl Linnie KIDD, MD;  Location: [facility] OR;  Service: Cardiothoracic;  Laterality: N/A;   APPLICATION OF WOUND VAC N/A 05/30/2019   Procedure: Application Of Wound Vac;  Surgeon: Shyrl Linnie KIDD, MD;  Location: [facility] OR;  Service: Cardiothoracic;  Laterality: N/A;   FINGER SURGERY Right 2016   I&D  small finger   I & D EXTREMITY Left 07/30/2018   Procedure: IRRIGATION AND DEBRIDEMENT LEFT FOOT WITH POSSIBLE AMPUTATION OF FIFTH TOE;  Surgeon: Vernetta Lonni GRADE, MD;  Location: [facility] ORS;  Service: Orthopedics;  Laterality: Left;   INCISION AND DRAINAGE ABSCESS Left 01/19/2019   Procedure: INCISION AND DRAINAGE ABSCESS upper chest;  Surgeon: Carlie Clark, MD;  Location: [facility] OR;  Service: ENT;  Laterality: Left;   IRRIGATION AND DEBRIDEMENT STERNOCLAVICULAR JOINT-STERNUM AND RIBS N/A 05/30/2019  Procedure: IRRIGATION AND DEBRIDEMENT OF STERNOCLAVICULAR JOINT-STERNUM AND RIBS ;  Surgeon: Shyrl Linnie KIDD, MD;  Location: [facility] OR;  Service: Cardiothoracic;  Laterality: N/A;   MINOR IRRIGATION AND DEBRIDEMENT OF WOUND N/A 11/22/2018   Procedure: INCISION AND DRAINAGE OF NECK ABSCESS;  Surgeon: Carlie Clark, MD;  Location: [facility] ORS;  Service: ENT;  Laterality: N/A;    Family History  Problem Relation Age of Onset   Diabetes Mother    Colon cancer Brother    Esophageal cancer Neg Hx    Rectal cancer Neg Hx     Stomach cancer Neg Hx      Social History   Tobacco Use   Smoking status: Never   Smokeless tobacco: Never  Vaping Use   Vaping status: Never Used  Substance Use Topics   Alcohol use: No    Alcohol/week: 0.0 standard drinks of alcohol   Drug use: No    Prior to Admission medications   Medication Sig Start Date End Date Taking? Authorizing Provider  acetaminophen  (TYLENOL ) 500 MG tablet Take 1,000 mg by mouth 2 (two) times daily as needed for moderate pain (pain score 4-6), fever or headache.   Yes [provider]  atorvastatin  (LIPITOR) 20 MG tablet Take 1 tablet (20 mg total) by mouth daily. 12/03/23  Yes Danton Jon HERO, PA-C  gabapentin  (NEURONTIN ) 300 MG capsule Take 1 capsule (300 mg total) by mouth 2 (two) times daily. 10/06/23  Yes Newlin, Enobong, MD  insulin  glargine (LANTUS  SOLOSTAR) 100 UNIT/ML Solostar Pen Inject 27 Units into the skin daily. 02/04/24  Yes Newlin, Enobong, MD  insulin  lispro (HUMALOG  KWIKPEN) 100 UNIT/ML KwikPen Inject 0-12 units into the skin three times daily with meals. Per sliding scale. 01/22/24  Yes Newlin, Enobong, MD  metoprolol  tartrate (LOPRESSOR ) 50 MG tablet Take 1 tablet (50 mg total) by mouth 2 (two) times daily. 01/22/24  Yes Newlin, Enobong, MD  naloxone  (NARCAN ) nasal spray 4 mg/0.1 mL Place 1 spray into one nostril as needed for opioid emergency. May repeat every 2-3 min until responsive or EMS  arrives. 12/01/23  Yes   omeprazole  (PRILOSEC) 40 MG capsule Take 1 capsule (40 mg total) by mouth daily. 12/03/23  Yes Danton Jon HERO, PA-C  sildenafil  (VIAGRA ) 100 MG tablet Take 1 tablet (100 mg total) by mouth daily as needed for erectile dysfunction. At least 24 hours between doses 01/22/24  Yes Newlin, Enobong, MD  valsartan  (DIOVAN ) 160 MG tablet Take 1 tablet (160 mg total) by mouth daily. 12/03/23  Yes Danton Jon HERO, PA-C  Accu-Chek Softclix Lancets lancets Use to check blood sugar 3 times daily. 02/09/24   Newlin, Enobong, MD   Continuous Glucose Sensor (FREESTYLE LIBRE 3 SENSOR) MISC Use as directed .Place 1 sensor on the skin every 14 days. Use to check glucose continuously 12/03/23   Danton Jon HERO, PA-C  glucose blood (ACCU-CHEK GUIDE TEST) test strip use as directed 3 (three) times daily. 06/02/23   Newlin, Enobong, MD  furosemide  (LASIX ) 20 MG tablet Take 2 tablets (40 mg total) by mouth daily. Patient not taking: Reported on 08/07/2023 05/14/23 11/04/23  Newlin, Enobong, MD    Current Facility-Administered Medications  Medication Dose Route Frequency Provider Last Rate Last Admin   atorvastatin  (LIPITOR) tablet 20 mg  20 mg Oral QHS Simpson, Paula B, NP       cefTRIAXone  (ROCEPHIN ) 2 g in sodium chloride  0.9 % 100 mL IVPB  2 g Intravenous Q24H Pham, Minh Q, RPH-CPP   Stopped  at 02/23/24 0936   Chlorhexidine  Gluconate Cloth 2 % PADS 6 each  6 each Topical Q0600 Maree Harder, MD   6 each at 02/23/24 1100   dextrose  50 % solution 0-50 mL  0-50 mL Intravenous PRN Ula Prentice SAUNDERS, MD       docusate sodium  (COLACE) capsule 100 mg  100 mg Oral BID PRN Gleason, Laura R, PA-C       heparin  injection 5,000 Units  5,000 Units Subcutaneous Q8H Simpson, Paula B, NP       insulin  aspart (novoLOG ) injection 0-5 Units  0-5 Units Subcutaneous QHS Simpson, Paula B, NP       insulin  aspart (novoLOG ) injection 0-9 Units  0-9 Units Subcutaneous TID WC Simpson, Paula B, NP   1 Units at 02/23/24 1210   insulin  glargine-yfgn (SEMGLEE ) injection 20 Units  20 Units Subcutaneous BID Simpson, Paula B, NP   20 Units at 02/23/24 1003   metoCLOPramide  (REGLAN ) injection 5 mg  5 mg Intravenous Q8H PRN Simpson, Paula B, NP   5 mg at 02/22/24 1953   metoprolol  tartrate (LOPRESSOR ) injection 2.5-5 mg  2.5-5 mg Intravenous Q3H PRN Simpson, Paula B, NP   5 mg at 02/22/24 1429   metoprolol  tartrate (LOPRESSOR ) tablet 25 mg  25 mg Oral BID Simpson, Paula B, NP   25 mg at 02/23/24 1047   mupirocin  ointment (BACTROBAN ) 2 % 1 Application  1 Application  Nasal BID Maree Harder, MD   1 Application at 02/23/24 0910   ondansetron  (ZOFRAN ) injection 4 mg  4 mg Intravenous Q6H PRN Haze Led, MD   4 mg at 02/23/24 0035   Oral care mouth rinse  15 mL Mouth Rinse PRN Maree Harder, MD       pantoprazole  (PROTONIX ) injection 40 mg  40 mg Intravenous Q12H Simpson, Paula B, NP   40 mg at 02/23/24 1048   polyethylene glycol (MIRALAX  / GLYCOLAX ) packet 17 g  17 g Oral Daily PRN Gleason, Leita SAUNDERS, PA-C        Allergies as of 02/21/2024   (No Known Allergies)    GI Review of Symptoms Significant for vomiting. Otherwise negative.  General Review of Systems  Review of systems is significant for the pertinent positives and negatives as listed per the HPI.  Full ROS is otherwise negative.    Physical Exam  Vital signs in last 24 hours: Temp:  [97.1 F (36.2 C)-98.3 F (36.8 C)] 97.7 F (36.5 C) (07/15 1200) Pulse Rate:  [91-123] 104 (07/15 1420) Resp:  [17-39] 21 (07/15 1420) BP: (110-174)/(51-132) 142/79 (07/15 1415) SpO2:  [96 %-100 %] 97 % (07/15 1420) Weight:  [135.2 kg] 135.2 kg (07/15 0500) Last BM Date : 02/23/24 General:  NAD, Well developed, Well nourished, fatigued but able to answer questions and appears appropriate from a cognitive perspective Head:  Normocephalic and atraumatic. Eyes:   PEERL, EOMI. No icterus. Conjunctiva pink. Lungs: Respirations even and unlabored. Lungs clear to auscultation bilaterally.   No wheezes, crackles, or rhonchi.  Heart: Normal S1, S2. No MRG. Regular rate and rhythm. No peripheral edema, cyanosis or pallor.  Abdomen:  Soft, nondistended, nontender. No rebound or guarding. Normal bowel sounds. No appreciable masses or hepatomegaly. Skin:   Dry and intact without significant lesions or rashes.    Lab Results Recent Labs    02/22/24 0326 02/22/24 1030 02/22/24 1814 02/23/24 0241  WBC 27.7* 26.0*  --  23.7*  HGB 8.6* 8.6* 9.7* 8.7*  HCT 31.2* 28.3* 30.5*  26.9*  PLT 383 396  --  375    BMET Recent Labs    02/22/24 2209 02/23/24 0241 02/23/24 0731  NA 142 142 142  K 3.5 3.8 4.2  CL 111 112* 111  CO2 17* 18* 18*  GLUCOSE 181* 169* 144*  BUN 48* 46* 43*  CREATININE 2.38* 1.97* 1.70*  CALCIUM  9.3 9.2 9.2   LFT Recent Labs    02/22/24 1030  PROT 7.5  ALBUMIN  1.7*  AST 11*  ALT 10  ALKPHOS 85  BILITOT 1.7*  BILIDIR <0.1  IBILI NOT CALCULATED   PT/INR Recent Labs    02/21/24 2130  LABPROT 20.1*  INR 1.6*    Radiographic Studies ECHOCARDIOGRAM COMPLETE Result Date: 02/23/2024    ECHOCARDIOGRAM REPORT   Patient Name:   TARICK PARENTEAU Date of Exam: 02/23/2024 Medical Rec #:  [id number]    Height:       72.0 in Accession #:    [id number]   Weight:       298.1 lb Date of Birth:  1971/05/18     BSA:          2.524 m Patient Age:    53 years     BP:           149/87 mmHg Patient Gender: M            HR:           100 bpm. Exam Location:  Inpatient Procedure: 2D Echo, Cardiac Doppler, Color Doppler and Intracardiac            Opacification Agent (Both Spectral and Color Flow Doppler were            utilized during procedure). Indications:    Endocarditis  History:        Patient has prior history of Echocardiogram examinations. GERD,                 Arrythmias:Atrial Fibrillation; Risk Factors:Hypertension and                 Diabetes.  Sonographer:    Christiana Mbomeh Referring Phys: [id number] PRAVEEN MANNAM IMPRESSIONS  1. Left ventricular ejection fraction, by estimation, is 55 to 60%. The left ventricle has normal function. The left ventricle has no regional wall motion abnormalities. Left ventricular diastolic parameters were normal.  2. Right ventricular systolic function is normal. The right ventricular size is normal. There is mildly elevated pulmonary artery systolic pressure. The estimated right ventricular systolic pressure is 39.8 mmHg.  3. The mitral valve is normal in structure. No evidence of mitral valve regurgitation. No evidence of mitral stenosis.  4. The  aortic valve is tricuspid. Aortic valve regurgitation is not visualized. No aortic stenosis is present.  5. The inferior vena cava is dilated in size with <50% respiratory variability, suggesting right atrial pressure of 15 mmHg. Conclusion(s)/Recommendation(s): No evidence of valvular vegetations on this transthoracic echocardiogram. Consider a transesophageal echocardiogram to exclude infective endocarditis if clinically indicated. FINDINGS  Left Ventricle: Left ventricular ejection fraction, by estimation, is 55 to 60%. The left ventricle has normal function. The left ventricle has no regional wall motion abnormalities. Definity  contrast agent was given IV to delineate the left ventricular  endocardial borders. The left ventricular internal cavity size was normal in size. There is no left ventricular hypertrophy. Left ventricular diastolic parameters were normal. Normal left ventricular filling pressure. Right Ventricle: The right ventricular size is normal. No increase in right ventricular wall thickness. Right ventricular  systolic function is normal. There is mildly elevated pulmonary artery systolic pressure. The tricuspid regurgitant velocity is 2.49  m/s, and with an assumed right atrial pressure of 15 mmHg, the estimated right ventricular systolic pressure is 39.8 mmHg. Left Atrium: Left atrial size was normal in size. Right Atrium: Right atrial size was normal in size. Pericardium: There is no evidence of pericardial effusion. Mitral Valve: The mitral valve is normal in structure. No evidence of mitral valve regurgitation. No evidence of mitral valve stenosis. Tricuspid Valve: The tricuspid valve is normal in structure. Tricuspid valve regurgitation is not demonstrated. No evidence of tricuspid stenosis. Aortic Valve: The aortic valve is tricuspid. Aortic valve regurgitation is not visualized. No aortic stenosis is present. Aortic valve mean gradient measures 5.0 mmHg. Aortic valve peak gradient measures  10.8 mmHg. Aortic valve area, by VTI measures 4.15  cm. Pulmonic Valve: The pulmonic valve was not well visualized. Pulmonic valve regurgitation is not visualized. No evidence of pulmonic stenosis. Aorta: The aortic root and ascending aorta are structurally normal, with no evidence of dilitation. Venous: The inferior vena cava is dilated in size with less than 50% respiratory variability, suggesting right atrial pressure of 15 mmHg. IAS/Shunts: No atrial level shunt detected by color flow Doppler.  LEFT VENTRICLE PLAX 2D LVIDd:         5.00 cm   Diastology LVIDs:         3.30 cm   LV e' medial:    12.30 cm/s LV PW:         1.00 cm   LV E/e' medial:  7.9 LV IVS:        1.10 cm   LV e' lateral:   12.10 cm/s LVOT diam:     2.30 cm   LV E/e' lateral: 8.0 LV SV:         111 LV SV Index:   44 LVOT Area:     4.15 cm  RIGHT VENTRICLE             IVC RV S prime:     14.70 cm/s  IVC diam: 2.30 cm TAPSE (M-mode): 2.4 cm LEFT ATRIUM             Index        RIGHT ATRIUM           Index LA Vol (A2C):   62.0 ml 24.56 ml/m  RA Area:     16.70 cm LA Vol (A4C):   73.2 ml 29.00 ml/m  RA Volume:   41.20 ml  16.32 ml/m LA Biplane Vol: 68.9 ml 27.29 ml/m  AORTIC VALVE AV Area (Vmax):    2.74 cm AV Area (Vmean):   2.77 cm AV Area (VTI):     4.15 cm AV Vmax:           164.00 cm/s AV Vmean:          108.000 cm/s AV VTI:            0.267 m AV Peak Grad:      10.8 mmHg AV Mean Grad:      5.0 mmHg LVOT Vmax:         108.00 cm/s LVOT Vmean:        72.000 cm/s LVOT VTI:          0.267 m LVOT/AV VTI ratio: 1.00  AORTA Ao Root diam: 2.80 cm Ao Asc diam:  2.90 cm MITRAL VALVE  TRICUSPID VALVE MV Area (PHT): 5.66 cm    TR Peak grad:   24.8 mmHg MV Decel Time: 134 msec    TR Vmax:        249.00 cm/s MV E velocity: 96.90 cm/s MV A velocity: 99.20 cm/s  SHUNTS MV E/A ratio:  0.98        Systemic VTI:  0.27 m                            Systemic Diam: 2.30 cm Jerel Croitoru MD Electronically signed by Jerel Balding MD Signature  Date/Time: 02/23/2024/2:04:17 PM    Final    CT Head Wo Contrast Result Date: 02/22/2024 CLINICAL DATA:  Headache, increasing frequency or severity EXAM: CT HEAD WITHOUT CONTRAST TECHNIQUE: Contiguous axial images were obtained from the base of the skull through the vertex without intravenous contrast. RADIATION DOSE REDUCTION: This exam was performed according to the departmental dose-optimization program which includes automated exposure control, adjustment of the mA and/or kV according to patient size and/or use of iterative reconstruction technique. COMPARISON:  None Available. FINDINGS: Brain: Normal anatomic configuration. No abnormal intra or extra-axial mass lesion or fluid collection. No abnormal mass effect or midline shift. No evidence of acute intracranial hemorrhage or infarct. Ventricular size is normal. Cerebellum unremarkable. Vascular: Unremarkable Skull: Intact Sinuses/Orbits: Paranasal sinuses are clear. Orbits are unremarkable. Other: Mastoid air cells and middle ear cavities are clear. IMPRESSION: 1. Normal CT examination of the head. Electronically Signed   By: Dorethia Molt M.D.   On: 02/22/2024 00:08   DG Foot Complete Left Result Date: 02/21/2024 CLINICAL DATA:  Questionable sepsis EXAM: LEFT FOOT - COMPLETE 3+ VIEW COMPARISON:  MRI of the left foot 11/11/2023. FINDINGS: Fifth ray is absent. The mid and distal third phalanx are absent, a new finding. There is irregular cortical thickening along the lateral margin of the mid and proximal fourth and third metatarsals. There is no acute fracture or dislocation identified. There is diffuse soft tissue swelling of the foot. There is a plantar ulceration overlying the midfoot. Charcot joint arthropathy noted of the midfoot, similar to prior MRI. IMPRESSION: 1. The mid and distal third phalanx are absent, a new finding. 2. Irregular cortical thickening along the lateral margin of the mid and proximal fourth and third metatarsals. Findings are  concerning for osteomyelitis. 3. Diffuse soft tissue swelling of the foot. 4. Plantar ulceration overlying the midfoot. 5. Charcot joint arthropathy of the midfoot, similar to prior MRI. Electronically Signed   By: Greig Pique M.D.   On: 02/21/2024 21:45   DG Chest Port 1 View Result Date: 02/21/2024 CLINICAL DATA:  Questionable sepsis EXAM: PORTABLE CHEST 1 VIEW COMPARISON:  Chest x-ray 11/10/2023 FINDINGS: There are increased interstitial markings centrally in both lungs. Costophrenic angles are clear. No pneumothorax. The heart is mildly enlarged, unchanged. No acute fractures are identified. IMPRESSION: Increased interstitial markings centrally in both lungs, which may represent pulmonary edema or atypical infection. Electronically Signed   By: Greig Pique M.D.   On: 02/21/2024 21:42   08/2022 nuclear medicine gastric emptying scan Delayed gastric emptying study at 3 and 4 hours. More normal at 1 and 2 hours.  Endoscopic Studies     Colonoscopy 08/07/2023 Millimeter polyp in the sigmoid colon -hyperplastic polyp   Clinical Impression   53 year old male with a past medical history noteworthy for atrial fibrillation, T2DM, HLD, HTN, morbid obesity, gastroparesis admitted with altered mental status, hyperglycemia and  DKA who is seen in inpatient consultation for evaluation and management of coffee-ground emesis.  Patient has had repeated episodes of vomiting in the setting of DKA and I suspect that coffee-ground emesis could be related to esophagitis, gastritis or a Mallory-Weiss tear.  There has been no evidence of active bleeding with frank hematemesis.  He is hemodynamically stable.  He does have a history of gastroparesis which could be a contributing factor.   Plan  Continue to monitor for signs and symptoms of overt active bleeding Monitor serial hemoglobin and hematocrit Continue Protonix  40 mg IV twice daily Continue Reglan  5 mg IV 3 times daily Continue Zofran  as needed Continue  MiraLAX  as needed If hemoglobin remains overall stable there would not be a need to proceed with endoscopic evaluation.  If, however, there is evidence of hematemesis or hemoglobin continues to decline indicating bleeding can reevaluate need for EGD.  Thank you for your kind consultation, we will continue to follow.  Inocente HERO Samanthamarie Ezzell  02/23/2024, 2:49 PM  Inocente Hausen, MD [facility]

## 2024-02-23 NOTE — Progress Notes (Signed)
 PT Cancellation Note  Patient Details Name: Taylor Hughes MRN: [id number] DOB: Nov 23, 1970   Cancelled Treatment:    Reason Eval/Treat Not Completed: Patient at procedure or test/unavailable   Trea Carnegie B Annina Piotrowski 02/23/2024, 1:17 PM Lenoard SQUIBB, PT Acute Rehabilitation Services Office: [phone]

## 2024-02-24 DIAGNOSIS — Z794 Long term (current) use of insulin: Secondary | ICD-10-CM

## 2024-02-24 DIAGNOSIS — R7881 Bacteremia: Secondary | ICD-10-CM

## 2024-02-24 DIAGNOSIS — E1110 Type 2 diabetes mellitus with ketoacidosis without coma: Secondary | ICD-10-CM | POA: Diagnosis not present

## 2024-02-24 DIAGNOSIS — B951 Streptococcus, group B, as the cause of diseases classified elsewhere: Secondary | ICD-10-CM | POA: Diagnosis not present

## 2024-02-24 DIAGNOSIS — L97529 Non-pressure chronic ulcer of other part of left foot with unspecified severity: Secondary | ICD-10-CM | POA: Diagnosis not present

## 2024-02-24 DIAGNOSIS — D509 Iron deficiency anemia, unspecified: Secondary | ICD-10-CM | POA: Diagnosis not present

## 2024-02-24 DIAGNOSIS — K920 Hematemesis: Secondary | ICD-10-CM | POA: Diagnosis not present

## 2024-02-24 LAB — CULTURE, BLOOD (ROUTINE X 2)

## 2024-02-24 LAB — CBC
HCT: 26 % — ABNORMAL LOW (ref 39.0–52.0)
Hemoglobin: 8.6 g/dL — ABNORMAL LOW (ref 13.0–17.0)
MCH: 27 pg (ref 26.0–34.0)
MCHC: 33.1 g/dL (ref 30.0–36.0)
MCV: 81.8 fL (ref 80.0–100.0)
Platelets: 333 K/uL (ref 150–400)
RBC: 3.18 MIL/uL — ABNORMAL LOW (ref 4.22–5.81)
RDW: 16.9 % — ABNORMAL HIGH (ref 11.5–15.5)
WBC: 16.4 K/uL — ABNORMAL HIGH (ref 4.0–10.5)
nRBC: 0.3 % — ABNORMAL HIGH (ref 0.0–0.2)

## 2024-02-24 LAB — BASIC METABOLIC PANEL WITH GFR
Anion gap: 13 (ref 5–15)
BUN: 33 mg/dL — ABNORMAL HIGH (ref 6–20)
CO2: 21 mmol/L — ABNORMAL LOW (ref 22–32)
Calcium: 9.3 mg/dL (ref 8.9–10.3)
Chloride: 112 mmol/L — ABNORMAL HIGH (ref 98–111)
Creatinine, Ser: 1.2 mg/dL (ref 0.61–1.24)
GFR, Estimated: >60 mL/min (ref >60)
Glucose, Bld: 360 mg/dL — ABNORMAL HIGH (ref 70–99)
Potassium: 3.7 mmol/L (ref 3.5–5.1)
Sodium: 146 mmol/L — ABNORMAL HIGH (ref 135–145)

## 2024-02-24 LAB — GLUCOSE, CAPILLARY
Glucose-Capillary: 336 mg/dL — ABNORMAL HIGH (ref 70–99)
Glucose-Capillary: 232 mg/dL — ABNORMAL HIGH (ref 70–99)
Glucose-Capillary: 192 mg/dL — ABNORMAL HIGH (ref 70–99)
Glucose-Capillary: 179 mg/dL — ABNORMAL HIGH (ref 70–99)

## 2024-02-24 LAB — PHOSPHORUS: Phosphorus: 1.2 mg/dL — ABNORMAL LOW (ref 2.5–4.6)

## 2024-02-24 MED ORDER — FERROUS SULFATE 325 (65 FE) MG PO TABS
325.0000 mg | ORAL_TABLET | Freq: Every day | ORAL | Status: DC
  Administered 2024-02-25 – 2024-03-04 (×8): 325 mg via ORAL
  Filled 2024-02-24 (×8): qty 1

## 2024-02-24 MED ORDER — SODIUM CHLORIDE 0.9 % IV SOLN
3.0000 g | Freq: Four times a day (QID) | INTRAVENOUS | Status: DC
  Administered 2024-02-24 – 2024-02-28 (×16): 3 g via INTRAVENOUS
  Filled 2024-02-24 (×16): qty 8

## 2024-02-24 NOTE — TOC Initial Note (Signed)
 Transition of Care [facility]) - Initial/Assessment Note    Patient Details  Name: Taylor Hughes MRN: [id number] Date of Birth: May 07, 1971  Transition [facility]) CM/SW Contact:    Roxie KANDICE Stain, RN Phone Number: 02/24/2024, 1:40 PM  Clinical Narrative:                 Spoke to patient at bedside regarding transition needs.  Patient lives with wife ( who is currently hospitalized) and has cane.  Resources for housing, rent, utilities printed out and given to patient. Patient has son and daughter who can assist. Patient requesting rollator. Notified Zack with [employer] of order, Address, Phone number and PCP verified. TOC will continue to follow for needs. Expected Discharge Plan: OP Rehab Barriers to Discharge: Continued Medical Work up   Patient Goals and CMS Choice Patient states their goals for this hospitalization and ongoing recovery are:: return home          Expected Discharge Plan and Services   Discharge Planning Services: CM Consult Post Acute Care Choice: Durable Medical Equipment Living arrangements for the past 2 months: Apartment                 DME Arranged: Walker rolling with seat DME Agency: [employer] Date DME Agency Contacted: 02/24/24 Time DME Agency Contacted: [phone] Representative spoke with at DME Agency: zack HH Arranged: NA HH Agency: NA        Prior Living Arrangements/Services Living arrangements for the past 2 months: Apartment Lives with:: Spouse Patient language and need for interpreter reviewed:: Yes Do you feel safe going back to the place where you live?: Yes      Need for Family Participation in Patient Care: Yes (Comment) Care giver support system in place?: Yes (comment) Current home services: DME (cane) Criminal Activity/Legal Involvement Pertinent to Current Situation/Hospitalization: No - Comment as needed  Activities of Daily Living      Permission Sought/Granted                  Emotional Assessment Appearance::  Appears stated age Attitude/Demeanor/Rapport: Gracious Affect (typically observed): Accepting Orientation: : Oriented to Self, Oriented to Place, Oriented to  Time, Oriented to Situation Alcohol / Substance Use: Not Applicable Psych Involvement: No (comment)  Admission diagnosis:  DKA (diabetic ketoacidosis) (HCC) [E11.10] Diabetic ketoacidosis without coma associated with other specified diabetes mellitus (HCC) [E13.10] Osteomyelitis, unspecified site, unspecified type (HCC) [M86.9] Patient Active Problem List   Diagnosis Date Noted   Coffee ground emesis 02/23/2024   Encephalopathy acute 02/22/2024   Non-pressure chronic ulcer of other part of left foot limited to breakdown of skin (HCC) 11/13/2023   Charcot joint of left foot 11/13/2023   Acute pyelonephritis 11/11/2023   DKA, type 2 (HCC) 11/10/2023   Flank pain 11/10/2023   Left thigh pain 11/10/2023   Neck pain 11/10/2023   Elevated troponin 11/10/2023   Lactic acidosis 11/10/2023   Acute cystitis with hematuria 06/25/2023   History of transmetatarsal amputation of right foot (HCC) 04/27/2023   Lymphedema 03/24/2023   Right ventricular enlargement 03/24/2023   Diabetic foot infection (HCC) 03/23/2023   Osteomyelitis (HCC) 03/22/2023   Atrial fibrillation, chronic (HCC) 03/22/2023   HLD (hyperlipidemia) 03/22/2023   Gastroparesis 03/22/2023   Depression 09/08/2022   GERD (gastroesophageal reflux disease) 03/03/2022   Diabetes mellitus (HCC) 09/19/2021   PAF (paroxysmal atrial fibrillation) (HCC)    Acute on chronic anemia    Venous insufficiency (chronic) (peripheral) 03/15/2019   Edema of both lower  extremities due to peripheral venous insufficiency 03/15/2019   Essential hypertension 11/23/2018   AKI (acute kidney injury) (HCC) 11/23/2018   Morbid obesity with BMI of 45.0-49.9, adult (HCC) 11/23/2018   Type 2 diabetes mellitus with other specified complication (HCC) 11/23/2018   Diabetic infection of left foot (HCC)     DKA (diabetic ketoacidosis) (HCC) 07/30/2018   DMII (diabetes mellitus, type 2) (HCC) 07/28/2018   PCP:  Delbert Clam, MD Pharmacy:   [facility] [address], [address] [place] [address] Phone: [phone] Fax: [phone]     Social Drivers of Health (SDOH) Social History: SDOH Screenings   Food Insecurity: Food Insecurity Present (11/18/2023)  Housing: Low Risk  (11/18/2023)  Transportation Needs: No Transportation Needs (12/04/2023)  Utilities: Not At Risk (11/18/2023)  Depression (PHQ2-9): Low Risk  (12/03/2023)  Financial Resource Strain: Medium Risk (06/24/2023)  Physical Activity: Unknown (06/24/2023)  Social Connections: Socially Isolated (06/24/2023)  Stress: No Stress Concern Present (06/24/2023)  Tobacco Use: Low Risk  (12/03/2023)   SDOH Interventions:     Readmission Risk Interventions    03/24/2023    4:56 PM 08/15/2022    1:23 PM  Readmission Risk Prevention Plan  Transportation Screening Complete Complete  PCP or Specialist Appt within 5-7 Days  Complete  PCP or Specialist Appt within 3-5 Days Complete   Home Care Screening  Complete  Medication Review (RN CM)  Complete  HRI or Home Care Consult Complete   Social Work Consult for Recovery Care Planning/Counseling Complete   Palliative Care Screening Not Applicable   Medication Review Oceanographer) Referral to Pharmacy

## 2024-02-24 NOTE — Inpatient Diabetes Management (Addendum)
 Inpatient Diabetes Program Recommendations  AACE/ADA: New Consensus Statement on Inpatient Glycemic Control (2015)  Target Ranges:  Prepandial:   less than 140 mg/dL      Peak postprandial:   less than 180 mg/dL (1-2 hours)      Critically ill patients:  140 - 180 mg/dL   Lab Results  Component Value Date   GLUCAP 232 (H) 02/24/2024   HGBA1C 10.9 (H) 02/22/2024    Review of Glycemic Control  Latest Reference Range & Units 02/23/24 11:10 02/23/24 12:06 02/23/24 15:18 02/23/24 20:22 02/24/24 07:59 02/24/24 11:55  Glucose-Capillary 70 - 99 mg/dL 847 (H) 857 (H) 741 (H) 289 (H) 336 (H) 232 (H)   Diabetes history: DM Outpatient Diabetes medications:  Lantus  27 units daily Humalog  0-12 units tid with meals  Current orders for Inpatient glycemic control:  Novolog  0-9 units tid with meals and HS Semglee  20 units bid Inpatient Diabetes Program Recommendations:    Note admit for DKA.  Will see patient.  Addendum 1400- Attempted to speak to patient however he states he is tired at this time.  Explained that I wanted to make sure he had insulin / supplies at home and that A1C is elevated.  He asks that I come back another time.   Thanks,  Randall Bullocks, RN, BC-ADM Inpatient Diabetes Coordinator Pager [phone]  (8a-5p)

## 2024-02-24 NOTE — Progress Notes (Signed)
 PT Cancellation Note  Patient Details Name: Taylor Hughes MRN: [id number] DOB: 27-Aug-1970   Cancelled Treatment:    Reason Eval/Treat Not Completed: Patient declined, no reason specified (PT consult appreciated and chart reviewed. Introduced self and the purpose of PT evaluation. Pt politelty refused stating he doesn't feel like moving around. Will follow-up as schedule permits.)  Randall SAUNDERS, PT, DPT Acute Rehabilitation Services Office: [phone] Secure Chat Preferred  [name] 02/24/2024, 10:08 AM

## 2024-02-24 NOTE — Progress Notes (Signed)
 Inpatient Progress Note     Patient Profile/Chief Complaint  Taylor Hughes is a 53 y.o. male with a past medical history noteworthy for atrial fibrillation, T2DM, HLD, HTN, morbid obesity, gastroparesis admitted with altered mental status, hyperglycemia and DKA who is seen in inpatient consultation for evaluation and management of coffee-ground emesis and IDA   Interval History   -- Transferred out of ICU and is feeling better -- No further vomiting or coffee-ground emesis -- Hemoglobin stable without active bleeding -- History of IDA and discussed with primary team proceeding with EGD for further investigation    Objective   Vital signs in last 24 hours: Temp:  [97.7 F (36.5 C)-99 F (37.2 C)] 97.9 F (36.6 C) (07/16 2020) Pulse Rate:  [88-103] 95 (07/16 2145) Resp:  [16-18] 18 (07/16 2020) BP: (132-159)/(79-92) 132/79 (07/16 2145) SpO2:  [98 %-100 %] 100 % (07/16 2020) Last BM Date : 02/23/24 General:    Alert, resting in bed no distress Heart:  Regular rate and rhythm; no murmurs Lungs: Respirations even and unlabored, lungs CTA bilaterally Abdomen:  Soft, nontender and nondistended. Normal bowel sounds. Extremities:  Without edema. Neurologic:  Alert and oriented,  grossly normal neurologically. Psych:  Cooperative. Normal mood and affect.  Intake/Output from previous day: 07/15 0701 - 07/16 0700 In: 1318.7 [P.O.:360; I.V.:645.7; IV Piggyback:313] Out: 1260 [Urine:1260] Intake/Output this shift: No intake/output data recorded.  Lab Results: Recent Labs    02/22/24 1030 02/22/24 1814 02/23/24 0241 02/24/24 0555  WBC 26.0*  --  23.7* 16.4*  HGB 8.6* 9.7* 8.7* 8.6*  HCT 28.3* 30.5* 26.9* 26.0*  PLT 396  --  375 333   BMET Recent Labs    02/23/24 0241 02/23/24 0731 02/24/24 0555  NA 142 142 146*  K 3.8 4.2 3.7  CL 112* 111 112*  CO2 18* 18* 21*  GLUCOSE 169* 144* 360*  BUN 46* 43* 33*  CREATININE 1.97* 1.70* 1.20  CALCIUM  9.2 9.2 9.3    LFT Recent Labs    02/22/24 1030  PROT 7.5  ALBUMIN  1.7*  AST 11*  ALT 10  ALKPHOS 85  BILITOT 1.7*  BILIDIR <0.1  IBILI NOT CALCULATED   PT/INR No results for input(s): LABPROT, INR in the last 72 hours.  Studies/Results: MR FOOT LEFT WO CONTRAST Result Date: 02/23/2024 CLINICAL DATA:  History of diabetes and plantar foot wound. Concern for soft tissue infection and osteomyelitis. EXAM: MRI OF THE LEFT FOOT WITHOUT CONTRAST TECHNIQUE: Multiplanar, multisequence MR imaging of the left hindfoot was performed. No intravenous contrast was administered. COMPARISON:  Radiographs 02/21/2024 and 07/30/2018. MRI 11/11/2023 and 08/02/2018. FINDINGS: Technical note: Despite efforts by the technologist and patient, mild motion artifact is present on today's exam and could not be eliminated. This reduces exam sensitivity and specificity. Patient was unable to complete the examination. No post-contrast imaging obtained. Bones/Joint/Cartilage Chronic plantar midfoot soft tissue ulceration appears progressive compared with the most recent MRI. Associated underlying soft tissue findings are further described below. There is new mild T2 hyperintensity within the cuboid without definite T1 marrow signal abnormality or cortical destruction. 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 The distal tibia, distal fibula, talus and calcaneus are intact. No significant hindfoot joint effusions. Ligaments Chronic attenuation of the anterior talofibular ligament, similar to previous MRI. No acute ligamentous findings identified. Muscles and Tendons Grossly stable mild distal Achilles tendinosis without tear. The medial flexor and anterior extensor tendons appear normal. Mild flattening of the  peroneus brevis tendon at the level of the lateral malleolus with heterogeneous fluid surrounding the tendon more distally at the level of the cuboid, further described below. Mild  generalized muscular atrophy and edema. Soft tissues As above, chronic soft tissue ulceration along the plantar aspect of the midfoot which appears increased from previous MRI of 3 months ago. Lateral to the calcaneus, there is a new heterogeneous fluid collection which partially encases the distal peroneus brevis tendon and measures approximately 3.2 x 2.5 x 3.8 cm. No other focal fluid collections are identified. There is generalized soft tissue edema throughout the hindfoot. IMPRESSION: 1. Chronic soft tissue ulceration along the plantar aspect of the midfoot which appears progressive compared with previous MRI of 3 months ago. 2. New heterogeneous fluid collection lateral to the calcaneus which partially encases the distal peroneus brevis tendon, suspicious for an abscess or infected tenosynovitis. 3. New mild T2 hyperintensity within the cuboid without definite T1 marrow signal abnormality or cortical destruction, equivocal for early osteomyelitis versus reactive marrow edema. 4. 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 5. Generalized soft tissue edema throughout the hindfoot. Electronically Signed   By: Elsie Perone M.D.   On: 02/23/2024 14:52   ECHOCARDIOGRAM COMPLETE Result Date: 02/23/2024    ECHOCARDIOGRAM REPORT   Patient Name:   Taylor Hughes Date of Exam: 02/23/2024 Medical Rec #:  [id number]    Height:       72.0 in Accession #:    [id number]   Weight:       298.1 lb Date of Birth:  08-31-1970     BSA:          2.524 m Patient Age:    53 years     BP:           149/87 mmHg Patient Gender: M            HR:           100 bpm. Exam Location:  Inpatient Procedure: 2D Echo, Cardiac Doppler, Color Doppler and Intracardiac            Opacification Agent (Both Spectral and Color Flow Doppler were            utilized during procedure). Indications:    Endocarditis  History:        Patient has prior history of Echocardiogram examinations. GERD,                  Arrythmias:Atrial Fibrillation; Risk Factors:Hypertension and                 Diabetes.  Sonographer:    Christiana Mbomeh Referring Phys: [id number] PRAVEEN MANNAM IMPRESSIONS  1. Left ventricular ejection fraction, by estimation, is 55 to 60%. The left ventricle has normal function. The left ventricle has no regional wall motion abnormalities. Left ventricular diastolic parameters were normal.  2. Right ventricular systolic function is normal. The right ventricular size is normal. There is mildly elevated pulmonary artery systolic pressure. The estimated right ventricular systolic pressure is 39.8 mmHg.  3. The mitral valve is normal in structure. No evidence of mitral valve regurgitation. No evidence of mitral stenosis.  4. The aortic valve is tricuspid. Aortic valve regurgitation is not visualized. No aortic stenosis is present.  5. The inferior vena cava is dilated in size with <50% respiratory variability, suggesting right atrial pressure of 15 mmHg. Conclusion(s)/Recommendation(s): No evidence of valvular vegetations on this transthoracic echocardiogram. Consider a transesophageal echocardiogram to  exclude infective endocarditis if clinically indicated. FINDINGS  Left Ventricle: Left ventricular ejection fraction, by estimation, is 55 to 60%. The left ventricle has normal function. The left ventricle has no regional wall motion abnormalities. Definity  contrast agent was given IV to delineate the left ventricular  endocardial borders. The left ventricular internal cavity size was normal in size. There is no left ventricular hypertrophy. Left ventricular diastolic parameters were normal. Normal left ventricular filling pressure. Right Ventricle: The right ventricular size is normal. No increase in right ventricular wall thickness. Right ventricular systolic function is normal. There is mildly elevated pulmonary artery systolic pressure. The tricuspid regurgitant velocity is 2.49  m/s, and with an assumed right  atrial pressure of 15 mmHg, the estimated right ventricular systolic pressure is 39.8 mmHg. Left Atrium: Left atrial size was normal in size. Right Atrium: Right atrial size was normal in size. Pericardium: There is no evidence of pericardial effusion. Mitral Valve: The mitral valve is normal in structure. No evidence of mitral valve regurgitation. No evidence of mitral valve stenosis. Tricuspid Valve: The tricuspid valve is normal in structure. Tricuspid valve regurgitation is not demonstrated. No evidence of tricuspid stenosis. Aortic Valve: The aortic valve is tricuspid. Aortic valve regurgitation is not visualized. No aortic stenosis is present. Aortic valve mean gradient measures 5.0 mmHg. Aortic valve peak gradient measures 10.8 mmHg. Aortic valve area, by VTI measures 4.15  cm. Pulmonic Valve: The pulmonic valve was not well visualized. Pulmonic valve regurgitation is not visualized. No evidence of pulmonic stenosis. Aorta: The aortic root and ascending aorta are structurally normal, with no evidence of dilitation. Venous: The inferior vena cava is dilated in size with less than 50% respiratory variability, suggesting right atrial pressure of 15 mmHg. IAS/Shunts: No atrial level shunt detected by color flow Doppler.  LEFT VENTRICLE PLAX 2D LVIDd:         5.00 cm   Diastology LVIDs:         3.30 cm   LV e' medial:    12.30 cm/s LV PW:         1.00 cm   LV E/e' medial:  7.9 LV IVS:        1.10 cm   LV e' lateral:   12.10 cm/s LVOT diam:     2.30 cm   LV E/e' lateral: 8.0 LV SV:         111 LV SV Index:   44 LVOT Area:     4.15 cm  RIGHT VENTRICLE             IVC RV S prime:     14.70 cm/s  IVC diam: 2.30 cm TAPSE (M-mode): 2.4 cm LEFT ATRIUM             Index        RIGHT ATRIUM           Index LA Vol (A2C):   62.0 ml 24.56 ml/m  RA Area:     16.70 cm LA Vol (A4C):   73.2 ml 29.00 ml/m  RA Volume:   41.20 ml  16.32 ml/m LA Biplane Vol: 68.9 ml 27.29 ml/m  AORTIC VALVE AV Area (Vmax):    2.74 cm AV Area  (Vmean):   2.77 cm AV Area (VTI):     4.15 cm AV Vmax:           164.00 cm/s AV Vmean:          108.000 cm/s AV VTI:  0.267 m AV Peak Grad:      10.8 mmHg AV Mean Grad:      5.0 mmHg LVOT Vmax:         108.00 cm/s LVOT Vmean:        72.000 cm/s LVOT VTI:          0.267 m LVOT/AV VTI ratio: 1.00  AORTA Ao Root diam: 2.80 cm Ao Asc diam:  2.90 cm MITRAL VALVE               TRICUSPID VALVE MV Area (PHT): 5.66 cm    TR Peak grad:   24.8 mmHg MV Decel Time: 134 msec    TR Vmax:        249.00 cm/s MV E velocity: 96.90 cm/s MV A velocity: 99.20 cm/s  SHUNTS MV E/A ratio:  0.98        Systemic VTI:  0.27 m                            Systemic Diam: 2.30 cm Mihai Croitoru MD Electronically signed by Jerel Balding MD Signature Date/Time: 02/23/2024/2:04:17 PM    Final     Endoscopic Studies: Colonoscopy 08/07/2023 Millimeter polyp in the sigmoid colon -hyperplastic polyp   Clinical Impression   53 year old male with a past medical history noteworthy for atrial fibrillation, T2DM, HLD, HTN, morbid obesity, gastroparesis admitted with altered mental status, hyperglycemia and DKA who is seen in inpatient consultation for evaluation and management of coffee-ground emesis.  Patient has had repeated episodes of vomiting in the setting of DKA and I suspect that coffee-ground emesis could be related to esophagitis, gastritis or a Mallory-Weiss tear.  There has been no evidence of active bleeding with frank hematemesis.  He is hemodynamically stable.  He does have a history of gastroparesis which could be a contributing factor.  He has not had further episodes of coffee-ground emesis.  Hemoglobin is stable.  He does have a history of iron deficiency anemia.  Recent colonoscopy in December 2024 showed a diminutive hyperplastic polyp.  Discussed with primary team proceeding with an EGD this hospitalization for further investigation of history of iron deficiency anemia.   Plan  Will plan for EGD 02/25/2024 for  further investigation of iron deficiency anemia N.p.o. after midnight for EGD tomorrow. Continue monitoring serial hemoglobin hematocrit Continue Protonix  40 mg IV twice daily Continue Reglan  5mg  IV 3 times daily Continue Zofran  as needed   LOS: 3 days   Inocente CHRISTELLA Hausen  02/24/2024, 9:57 PM  Inocente Hausen, MD [facility]

## 2024-02-24 NOTE — Consult Note (Signed)
 [facility]    Date of Admission:  02/21/2024   Total days of inpatient antibiotics 2        Reason for Consult: bacteremia    Principal Problem:   DKA (diabetic ketoacidosis) (HCC) Active Problems:   Acute on chronic anemia   Encephalopathy acute   Coffee ground emesis   Assessment: 53 year old male with history of prior chest wall abscess and sternoclavicular joint infection with MSSA and group A strep treated in 2020, bilateral feet osteomyelitis, right foot osteomyelitis status post TMA with polymicrobial cultures treated with 6 weeks of Augmentin  on discharge, diabetes mellitus admitted with DKA found to have  #Group B strep bacteremia secondary to left foot wound - Patient presented with fusion, left foot ulcer suspected to be infected.  Started on broad-spectrum antibiotics now on ceftriaxone  -Blood culture admission 1/1 group B strep, repeat cultures no growth so far - MRI left foot showed possible abscess along calcaneus, possible early osteomyelitis cuboid bone. - TTE without vegetation.  Given wound has been present for about 3 months and patient is bacteremic will need TEE  #Poorly controlled diabetes - DKA - A1c 10.3 - Needs glycemic control for optimal wound healing Recommendations: - Discontinue ceftriaxone  -Start Unasyn  - Orthopedic recommendations - Will need TEE, paged cards - Follow repeat cultures to ensure clearance-communicated plan to primary - Standard precautions   Evaluation of this patient requires complex antimicrobial therapy evaluation and counseling + isolation needs for disease transmission risk assessment and mitigation   Microbiology:   Antibiotics: Ceftriaxone  7/14-present Cefepime  7/23 Vancomycin  7/13 Metronidazole  7/13   Cultures: Blood 7/13 group B strep 7/14 no growth Urine  Other   HPI: Taylor Hughes is a 53 y.o. male with chest wall abscess, sternoclavicular joint infection,  osteomyelitis of fifth digit of left foot admitted, right foot osteomyelitis status post TMA treated with Unasyn  acute polymicrobial cultures, type 2 diabetes with history of admission of DKA, gastroparesis, A-fib admitted with DKA in the setting of left foot wound.  Blood cultures from admission grew group B strep.  MRI of the left foot showed fluid collection lateral to calcaneus concerning for abscess versus tenosynovitis, possible early osteomyelitis cuboid bone.  Patient states she has had this wound for about 3 months.   Review of Systems: Review of Systems  All other systems reviewed and are negative.   Past Medical History:  Diagnosis Date   Atrial fibrillation (HCC)    Diabetes mellitus without complication (HCC)    Type 2   GERD (gastroesophageal reflux disease)    Hyperlipidemia    Hypertension    Morbid (severe) obesity due to excess calories (HCC) 08/07/2023   bmi 44.63   Wears glasses    Wound, open, foot    left diabetic     Social History   Tobacco Use   Smoking status: Never   Smokeless tobacco: Never  Vaping Use   Vaping status: Never Used  Substance Use Topics   Alcohol use: No    Alcohol/week: 0.0 standard drinks of alcohol   Drug use: No    Family History  Problem Relation Age of Onset   Diabetes Mother    Colon cancer Brother    Esophageal cancer Neg Hx    Rectal cancer Neg Hx    Stomach cancer Neg Hx    Scheduled Meds:  atorvastatin   20 mg Oral QHS   Chlorhexidine  Gluconate Cloth  6 each Topical Q0600  heparin  injection (subcutaneous)  5,000 Units Subcutaneous Q8H   insulin  aspart  0-5 Units Subcutaneous QHS   insulin  aspart  0-9 Units Subcutaneous TID WC   insulin  glargine-yfgn  20 Units Subcutaneous BID   metoCLOPramide  (REGLAN ) injection  5 mg Intravenous Q8H   metoprolol  tartrate  25 mg Oral BID   multivitamin with minerals  1 tablet Oral Daily   mupirocin  ointment  1 Application Nasal BID   nutrition supplement (JUVEN)  1 packet Oral  BID BM   pantoprazole  (PROTONIX ) IV  40 mg Intravenous Q12H   Continuous Infusions:  ampicillin -sulbactam (UNASYN ) IV 3 g (02/24/24 1249)   PRN Meds:.dextrose , docusate sodium , metoprolol  tartrate, ondansetron  (ZOFRAN ) IV, mouth rinse, polyethylene glycol No Known Allergies  OBJECTIVE: Blood pressure 138/84, pulse 93, temperature 98 F (36.7 C), temperature source Oral, resp. rate 18, height 6' (1.829 m), weight 135.2 kg, SpO2 100%.  Physical Exam Constitutional:      General: He is not in acute distress.    Appearance: He is normal weight. He is not toxic-appearing.  HENT:     Head: Normocephalic and atraumatic.     Right Ear: External ear normal.     Left Ear: External ear normal.     Nose: No congestion or rhinorrhea.     Mouth/Throat:     Mouth: Mucous membranes are moist.     Pharynx: Oropharynx is clear.  Eyes:     Extraocular Movements: Extraocular movements intact.     Conjunctiva/sclera: Conjunctivae normal.     Pupils: Pupils are equal, round, and reactive to light.  Cardiovascular:     Rate and Rhythm: Normal rate and regular rhythm.     Heart sounds: No murmur heard.    No friction rub. No gallop.  Pulmonary:     Effort: Pulmonary effort is normal.     Breath sounds: Normal breath sounds.  Abdominal:     General: Abdomen is flat. Bowel sounds are normal.     Palpations: Abdomen is soft.  Musculoskeletal:        General: No swelling.     Cervical back: Normal range of motion and neck supple.     Comments: B/l feet bandaged  Skin:    General: Skin is warm and dry.  Neurological:     General: No focal deficit present.     Mental Status: He is oriented to person, place, and time.  Psychiatric:        Mood and Affect: Mood normal.     Lab Results Lab Results  Component Value Date   WBC 16.4 (H) 02/24/2024   HGB 8.6 (L) 02/24/2024   HCT 26.0 (L) 02/24/2024   MCV 81.8 02/24/2024   PLT 333 02/24/2024    Lab Results  Component Value Date   CREATININE  1.20 02/24/2024   BUN 33 (H) 02/24/2024   NA 146 (H) 02/24/2024   K 3.7 02/24/2024   CL 112 (H) 02/24/2024   CO2 21 (L) 02/24/2024    Lab Results  Component Value Date   ALT 10 02/22/2024   AST 11 (L) 02/22/2024   ALKPHOS 85 02/22/2024   BILITOT 1.7 (H) 02/22/2024       Taylor Stank, MD [facility] 02/24/2024, 12:57 PM

## 2024-02-24 NOTE — Progress Notes (Addendum)
 PROGRESS NOTE  Taylor Hughes  FMW:[id number] DOB: [birth year] DOA: 02/21/2024 PCP: Delbert Clam, MD   Brief Narrative: Patient is a 53 year old male with history of paroxysmal atrial fibrillation, type 2 diabetes, DKA, gastroparesis, obesity who presented with altered mentation, hyperglycemia on 7/13.[name]  He was recently admitted for DKA in 4/25 and was treated for left foot infection as well.  Workup in the ED was consistent with DKA with glucose of more than 1000, potassium 6.9, bicarb of less than 7, creatinine of 3.2, pH of 6.8.  He was confused on presentation.  Left foot had a ulcer on the heel that was suspected  to be infected.  Started on empiric  antibiotics, IV insulin .  Patient was admitted to [facility] service.  Patient transferred to [facility] service on 7/16. Mentation has improved.  ID and orthopedics consulted for possible osteomyelitis of the left foot  Assessment & Plan:  Principal Problem:   DKA (diabetic ketoacidosis) (HCC) Active Problems:   Acute on chronic anemia   Encephalopathy acute   Coffee ground emesis  DKA: Initially started on insulin  drip.  DKA has resolved.  Currently on subcutaneous insulin .  A1c of 10.9 , consulted diabetic coordinator and following.  Sepsis secondary to left foot infection/B/L LE lymphedema: Presented with leukocytosis.  He has history of left foot infection in the past as well.  MRI was incomplete but showed  chronic soft tissue ulceration along the plantar aspect of the midfoot which appears progressive compared with previous MRI of 3 months ago,new heterogeneous fluid collection lateral to the calcaneus partially encasing  the distal peroneus brevis tendon, suspicious for an abscess or infected tenosynovitis.  Also showed bone marrow edema, could not rule out osteomyelitis.  Consulted orthopedics, Dr Harden.  Continue broad-spectrum antibiotics for now.  He follows with Dr. Harden as an outpatient.  Bacteremia: One of the blood cultures sent on 7/13  showed gram-positive cocci in chains in both aerobic /anaerobic bottles.  Continue current antibiotic. Consulted  ID.  2D echo showed EF of 35 to 60%, no wall motion abnormality, no evidence of valvular vegetation.Plan for TEE  AKI/AGMA: Secondary to dehydration, DKA.  Currently resolved. Foley discontinued.  Phosphorus was low, supplemented  Acute metabolic encephalopathy: CT head negative for acute findings.  Improving mental status with correction of metabolic derangements, AKI, DKA.  Limit sedating medications.  Improving  History of paroxysmal A-fib: Currently in normal sinus rhythm.  On metoprolol  at home.  He is not on anticoagulation at home.  Follows with PCP.  CHA2DS2-VASc score of 2  Hypertension: Monitor blood pressure.  Valsartan  on hold due to AKI  Hyperlipidemia: On statin  Anemia of chronic disease/iron deficiency: His hemoglobin was in the range of 11 few months ago.  Noticed to have black tarry stools during this hospitalization.  GI consulted.  Currently hemoglobin in the range of 8.  Severe iron deficiency with a level of 7.  Will check if he needs IV iron  GERD/history of gastroparesis: continue PPI.  GI following.  Concern for coffee-ground emesis.  Gastric emptying scan on 1/24 showed delayed gastric emptying.  Currently on Protonix , Reglan , Zofran .  GI not planning for EGD for now.We will reach out to GI again.  Obesity: BMI of 39      Nutrition Problem: Inadequate oral intake Etiology: chronic illness (gastroparesis and uncontrolled diabetes)    DVT prophylaxis:heparin  injection 5,000 Units Start: 02/23/24 1400 SCDs Start: 02/21/24 2325     Code Status: Full Code  Family Communication: Called  and discussed with significant other [name] on phone on 7/16  Patient status:Inpatient  Patient is from :Home  Anticipated discharge un:[facility] vs SNF  Estimated DC date:2-3 days    Consultants: PCCM, GI, ID, orthopedics  Procedures: None yet  Antimicrobials:   Anti-infectives (From admission, onward)    Start     Dose/Rate Route Frequency Ordered Stop   02/23/24 1000  vancomycin  (VANCOREADY) IVPB 1500 mg/300 mL  Status:  Discontinued        1,500 mg 150 mL/hr over 120 Minutes Intravenous Every 24 hours 02/22/24 0018 02/22/24 0830   02/22/24 1000  ceFEPIme  (MAXIPIME ) 2 g in sodium chloride  0.9 % 100 mL IVPB  Status:  Discontinued        2 g 200 mL/hr over 30 Minutes Intravenous Every 12 hours 02/22/24 0018 02/22/24 0830   02/22/24 1000  linezolid  (ZYVOX ) IVPB 600 mg  Status:  Discontinued        600 mg 300 mL/hr over 60 Minutes Intravenous Every 12 hours 02/22/24 0830 02/22/24 1111   02/22/24 0930  cefTRIAXone  (ROCEPHIN ) 2 g in sodium chloride  0.9 % 100 mL IVPB        2 g 200 mL/hr over 30 Minutes Intravenous Every 24 hours 02/22/24 0830     02/21/24 2115  ceFEPIme  (MAXIPIME ) 2 g in sodium chloride  0.9 % 100 mL IVPB        2 g 200 mL/hr over 30 Minutes Intravenous  Once 02/21/24 2103 02/21/24 2213   02/21/24 2115  metroNIDAZOLE  (FLAGYL ) IVPB 500 mg        500 mg 100 mL/hr over 60 Minutes Intravenous  Once 02/21/24 2103 02/21/24 2257   02/21/24 2115  vancomycin  (VANCOCIN ) IVPB 1000 mg/200 mL premix  Status:  Discontinued        1,000 mg 200 mL/hr over 60 Minutes Intravenous  Once 02/21/24 2103 02/21/24 2106   02/21/24 2115  vancomycin  (VANCOREADY) IVPB 2000 mg/400 mL        2,000 mg 200 mL/hr over 120 Minutes Intravenous  Once 02/21/24 2106 02/22/24 0051       Subjective: Patient seen and examined at bedside today.  Lying on bed.  Does not look to be in distress, intermittently confused but was  able to communicate well.  Knows current month and that he is in the hospital.  Denies abdomen pain, nausea .  Noticed to have black stools on the bed.  Complains of pain on the left foot left foot.  Looks weak and deconditioned  Objective: Vitals:   02/23/24 1830 02/23/24 2020 02/24/24 0006 02/24/24 0440  BP: (!) 136/93 (!) 156/81 (!) 159/92  135/83  Pulse: (!) 102 (!) 105 (!) 103 94  Resp: 20 19 18 16   Temp: 98.2 F (36.8 C) 97.9 F (36.6 C) 98 F (36.7 C) 99 F (37.2 C)  TempSrc: Oral Oral Oral Oral  SpO2:  100% 99% 98%  Weight:      Height:        Intake/Output Summary (Last 24 hours) at 02/24/2024 0755 Last data filed at 02/23/2024 2020 Gross per 24 hour  Intake 1241.39 ml  Output 1225 ml  Net 16.39 ml   Filed Weights   02/21/24 2103 02/22/24 0018 02/23/24 0500  Weight: (!) 156 kg 130.6 kg 135.2 kg    Examination:  General exam: Not in distress, morbidly obese, lying in bed, weak and chronically ill looking HEENT: PERRL Respiratory system:  no wheezes or crackles  Cardiovascular system: S1 & S2  heard, RRR.  Gastrointestinal system: Abdomen is mildly distended, soft and nontender. Central nervous system: Alert and awake, oriented to place, tells month correctly as well as year Extremities: Lower extremity edema, wrapped in dressing, ulcer on the heel of the left foot Skin: No rashes, no icterus     Data Reviewed: I have personally reviewed following labs and imaging studies  CBC: Recent Labs  Lab 02/21/24 2130 02/21/24 2134 02/22/24 0326 02/22/24 1030 02/22/24 1814 02/23/24 0241 02/24/24 0555  WBC 25.9*  --  27.7* 26.0*  --  23.7* 16.4*  NEUTROABS 24.5*  --   --   --   --   --   --   HGB 9.6*   < > 8.6* 8.6* 9.7* 8.7* 8.6*  HCT 36.4*   < > 31.2* 28.3* 30.5* 26.9* 26.0*  MCV 103.7*  --  100.3* 90.1  --  83.8 81.8  PLT 487*  --  383 396  --  375 333   < > = values in this interval not displayed.   Basic Metabolic Panel: Recent Labs  Lab 02/22/24 1030 02/22/24 1421 02/22/24 1814 02/22/24 2209 02/23/24 0241 02/23/24 0731 02/24/24 0555  NA 138   < > 142 142 142 142 146*  K 4.1   < > 3.3* 3.5 3.8 4.2 3.7  CL 103   < > 110 111 112* 111 112*  CO2 12*   < > 16* 17* 18* 18* 21*  GLUCOSE 769*   < > 399* 181* 169* 144* 360*  BUN 49*   < > 50* 48* 46* 43* 33*  CREATININE 3.14*   < > 2.72*  2.38* 1.97* 1.70* 1.20  CALCIUM  9.0   < > 9.2 9.3 9.2 9.2 9.3  MG 2.6*  --   --   --   --  2.3  --   PHOS 1.6*  --   --   --  <1.0*  --   --    < > = values in this interval not displayed.     Recent Results (from the past 240 hours)  Resp panel by RT-PCR (RSV, Flu A&B, Covid) Anterior Nasal Swab     Status: None   Collection Time: 02/21/24  9:03 PM   Specimen: Anterior Nasal Swab  Result Value Ref Range Status   SARS Coronavirus 2 by RT PCR NEGATIVE NEGATIVE Final   Influenza A by PCR NEGATIVE NEGATIVE Final   Influenza B by PCR NEGATIVE NEGATIVE Final    Comment: (NOTE) The Xpert Xpress SARS-CoV-2/FLU/RSV plus assay is intended as an aid in the diagnosis of influenza from Nasopharyngeal swab specimens and should not be used as a sole basis for treatment. Nasal washings and aspirates are unacceptable for Xpert Xpress SARS-CoV-2/FLU/RSV testing.  Fact Sheet for Patients: BloggerCourse.com  Fact Sheet for Healthcare Providers: SeriousBroker.it  This test is not yet approved or cleared by the [place]  FDA and has been authorized for detection and/or diagnosis of SARS-CoV-2 by FDA under an Emergency Use Authorization (EUA). This EUA will remain in effect (meaning this test can be used) for the duration of the COVID-19 declaration under Section 564(b)(1) of the Act, 21 U.S.C. section 360bbb-3(b)(1), unless the authorization is terminated or revoked.     Resp Syncytial Virus by PCR NEGATIVE NEGATIVE Final    Comment: (NOTE) Fact Sheet for Patients: BloggerCourse.com  Fact Sheet for Healthcare Providers: SeriousBroker.it  This test is not yet approved or cleared by the [place]  FDA and has been authorized  for detection and/or diagnosis of SARS-CoV-2 by FDA under an Emergency Use Authorization (EUA). This EUA will remain in effect (meaning this test can be used) for  the duration of the COVID-19 declaration under Section 564(b)(1) of the Act, 21 U.S.C. section 360bbb-3(b)(1), unless the authorization is terminated or revoked.  Performed at [facility], [address] [place], [place] [address]   Blood Culture (routine x 2)     Status: Abnormal   Collection Time: 02/21/24  9:03 PM   Specimen: BLOOD  Result Value Ref Range Status   Specimen Description BLOOD BLOOD RIGHT HAND  Final   Special Requests   Final    BOTTLES DRAWN AEROBIC AND ANAEROBIC Blood Culture results may not be optimal due to an inadequate volume of blood received in culture bottles   Culture  Setup Time   Final    GRAM POSITIVE COCCI IN CHAINS IN BOTH AEROBIC AND ANAEROBIC BOTTLES CRITICAL RESULT CALLED TO, READ BACK BY AND VERIFIED WITH: PHARMD EMILY S ON [id number] @1056  BY SM Performed at [facility], [address], [place], [place] [address]    Culture GROUP B STREP(S.AGALACTIAE)ISOLATED (A)  Final   Report Status 02/24/2024 FINAL  Final   Organism ID, Bacteria GROUP B STREP(S.AGALACTIAE)ISOLATED  Final      Susceptibility   Group b strep(s.agalactiae)isolated - MIC*    CLINDAMYCIN  >=1 RESISTANT Resistant     AMPICILLIN  <=0.25 SENSITIVE Sensitive     ERYTHROMYCIN >=8 RESISTANT Resistant     VANCOMYCIN  1 SENSITIVE Sensitive     CEFTRIAXONE  <=0.12 SENSITIVE Sensitive     LEVOFLOXACIN 0.5 SENSITIVE Sensitive     PENICILLIN  <=0.06 SENSITIVE Sensitive     * GROUP B STREP(S.AGALACTIAE)ISOLATED  Blood Culture ID Panel (Reflexed)     Status: Abnormal   Collection Time: 02/21/24  9:03 PM  Result Value Ref Range Status   Enterococcus faecalis NOT DETECTED NOT DETECTED Final   Enterococcus Faecium NOT DETECTED NOT DETECTED Final   Listeria monocytogenes NOT DETECTED NOT DETECTED Final   Staphylococcus species NOT DETECTED NOT DETECTED Final   Staphylococcus aureus (BCID) NOT DETECTED NOT DETECTED Final   Staphylococcus epidermidis NOT DETECTED NOT DETECTED Final    Staphylococcus lugdunensis NOT DETECTED NOT DETECTED Final   Streptococcus species DETECTED (A) NOT DETECTED Final    Comment: CRITICAL RESULT CALLED TO, READ BACK BY AND VERIFIED WITH: PHARMD EMILY S ON [id number] @1056  BY SM    Streptococcus agalactiae DETECTED (A) NOT DETECTED Final    Comment: CRITICAL RESULT CALLED TO, READ BACK BY AND VERIFIED WITH: PHARMD EMILY S ON [id number] @1056  BY SM    Streptococcus pneumoniae NOT DETECTED NOT DETECTED Final   Streptococcus pyogenes NOT DETECTED NOT DETECTED Final   A.calcoaceticus-baumannii NOT DETECTED NOT DETECTED Final   Bacteroides fragilis NOT DETECTED NOT DETECTED Final   Enterobacterales NOT DETECTED NOT DETECTED Final   Enterobacter cloacae complex NOT DETECTED NOT DETECTED Final   Escherichia coli NOT DETECTED NOT DETECTED Final   Klebsiella aerogenes NOT DETECTED NOT DETECTED Final   Klebsiella oxytoca NOT DETECTED NOT DETECTED Final   Klebsiella pneumoniae NOT DETECTED NOT DETECTED Final   Proteus species NOT DETECTED NOT DETECTED Final   Salmonella species NOT DETECTED NOT DETECTED Final   Serratia marcescens NOT DETECTED NOT DETECTED Final   Haemophilus influenzae NOT DETECTED NOT DETECTED Final   Neisseria meningitidis NOT DETECTED NOT DETECTED Final   Pseudomonas aeruginosa NOT DETECTED NOT DETECTED Final   Stenotrophomonas maltophilia NOT  DETECTED NOT DETECTED Final   Candida albicans NOT DETECTED NOT DETECTED Final   Candida auris NOT DETECTED NOT DETECTED Final   Candida glabrata NOT DETECTED NOT DETECTED Final   Candida krusei NOT DETECTED NOT DETECTED Final   Candida parapsilosis NOT DETECTED NOT DETECTED Final   Candida tropicalis NOT DETECTED NOT DETECTED Final   Cryptococcus neoformans/gattii NOT DETECTED NOT DETECTED Final    Comment: Performed at [facility], [address], [place], [place] [address]  MRSA Next Gen by PCR, Nasal     Status: Abnormal   Collection Time: 02/21/24 11:25 PM   Specimen: Nasal  Mucosa; Nasal Swab  Result Value Ref Range Status   MRSA by PCR Next Gen DETECTED (A) NOT DETECTED Final    Comment: RESULT CALLED TO, READ BACK BY AND VERIFIED WITH: WILLIAMSON RN 02/22/2024 @ 0205 BY AB (NOTE) The GeneXpert MRSA Assay (FDA approved for NASAL specimens only), is one component of a comprehensive MRSA colonization surveillance program. It is not intended to diagnose MRSA infection nor to guide or monitor treatment for MRSA infections. Test performance is not FDA approved in patients less than 38 years old. Performed at [facility], [address], [place], [place] [address]   Culture, blood (Routine X 2) w Reflex to ID Panel     Status: None (Preliminary result)   Collection Time: 02/22/24 10:30 AM   Specimen: BLOOD RIGHT HAND  Result Value Ref Range Status   Specimen Description BLOOD RIGHT HAND  Final   Special Requests   Final    BOTTLES DRAWN AEROBIC AND ANAEROBIC Blood Culture adequate volume   Culture   Final    NO GROWTH 2 DAYS Performed at [facility], [address], [place], [place] [address]    Report Status PENDING  Incomplete  Culture, blood (Routine X 2) w Reflex to ID Panel     Status: None (Preliminary result)   Collection Time: 02/22/24 10:30 AM   Specimen: BLOOD RIGHT HAND  Result Value Ref Range Status   Specimen Description BLOOD RIGHT HAND  Final   Special Requests   Final    BOTTLES DRAWN AEROBIC ONLY Blood Culture adequate volume   Culture   Final    NO GROWTH 2 DAYS Performed at [facility], [address], [place], [place] [address]    Report Status PENDING  Incomplete     Radiology Studies: MR FOOT LEFT WO CONTRAST Result Date: 02/23/2024 CLINICAL DATA:  History of diabetes and plantar foot wound. Concern for soft tissue infection and osteomyelitis. EXAM: MRI OF THE LEFT FOOT WITHOUT CONTRAST TECHNIQUE: Multiplanar, multisequence MR imaging of the left hindfoot was performed. No intravenous contrast was  administered. COMPARISON:  Radiographs 02/21/2024 and 07/30/2018. MRI 11/11/2023 and 08/02/2018. FINDINGS: Technical note: Despite efforts by the technologist and patient, mild motion artifact is present on today's exam and could not be eliminated. This reduces exam sensitivity and specificity. Patient was unable to complete the examination. No post-contrast imaging obtained. Bones/Joint/Cartilage Chronic plantar midfoot soft tissue ulceration appears progressive compared with the most recent MRI. Associated underlying soft tissue findings are further described below. There is new mild T2 hyperintensity within the cuboid without definite T1 marrow signal abnormality or cortical destruction. 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 The distal tibia, distal fibula, talus and calcaneus are intact. No significant hindfoot joint effusions. Ligaments Chronic attenuation of the anterior talofibular ligament, similar to previous  MRI. No acute ligamentous findings identified. Muscles and Tendons Grossly stable mild distal Achilles tendinosis without tear. The medial flexor and anterior extensor tendons appear normal. Mild flattening of the peroneus brevis tendon at the level of the lateral malleolus with heterogeneous fluid surrounding the tendon more distally at the level of the cuboid, further described below. Mild generalized muscular atrophy and edema. Soft tissues As above, chronic soft tissue ulceration along the plantar aspect of the midfoot which appears increased from previous MRI of 3 months ago. Lateral to the calcaneus, there is a new heterogeneous fluid collection which partially encases the distal peroneus brevis tendon and measures approximately 3.2 x 2.5 x 3.8 cm. No other focal fluid collections are identified. There is generalized soft tissue edema throughout the hindfoot. IMPRESSION: 1. Chronic soft tissue ulceration along the plantar aspect of the midfoot  which appears progressive compared with previous MRI of 3 months ago. 2. New heterogeneous fluid collection lateral to the calcaneus which partially encases the distal peroneus brevis tendon, suspicious for an abscess or infected tenosynovitis. 3. New mild T2 hyperintensity within the cuboid without definite T1 marrow signal abnormality or cortical destruction, equivocal for early osteomyelitis versus reactive marrow edema. 4. 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 5. Generalized soft tissue edema throughout the hindfoot. Electronically Signed   By: Elsie Perone M.D.   On: 02/23/2024 14:52   ECHOCARDIOGRAM COMPLETE Result Date: 02/23/2024    ECHOCARDIOGRAM REPORT   Patient Name:   Taylor Hughes Date of Exam: 02/23/2024 Medical Rec #:  [id number]    Height:       72.0 in Accession #:    [id number]   Weight:       298.1 lb Date of Birth:  1971-04-03     BSA:          2.524 m Patient Age:    53 years     BP:           149/87 mmHg Patient Gender: M            HR:           100 bpm. Exam Location:  Inpatient Procedure: 2D Echo, Cardiac Doppler, Color Doppler and Intracardiac            Opacification Agent (Both Spectral and Color Flow Doppler were            utilized during procedure). Indications:    Endocarditis  History:        Patient has prior history of Echocardiogram examinations. GERD,                 Arrythmias:Atrial Fibrillation; Risk Factors:Hypertension and                 Diabetes.  Sonographer:    Christiana Mbomeh Referring Phys: [id number] PRAVEEN MANNAM IMPRESSIONS  1. Left ventricular ejection fraction, by estimation, is 55 to 60%. The left ventricle has normal function. The left ventricle has no regional wall motion abnormalities. Left ventricular diastolic parameters were normal.  2. Right ventricular systolic function is normal. The right ventricular size is normal. There is mildly elevated pulmonary artery systolic pressure. The estimated right  ventricular systolic pressure is 39.8 mmHg.  3. The mitral valve is normal in structure. No evidence of mitral valve regurgitation. No evidence of mitral stenosis.  4. The aortic valve is tricuspid. Aortic valve regurgitation is not visualized. No aortic stenosis is present.  5. The inferior vena cava  is dilated in size with <50% respiratory variability, suggesting right atrial pressure of 15 mmHg. Conclusion(s)/Recommendation(s): No evidence of valvular vegetations on this transthoracic echocardiogram. Consider a transesophageal echocardiogram to exclude infective endocarditis if clinically indicated. FINDINGS  Left Ventricle: Left ventricular ejection fraction, by estimation, is 55 to 60%. The left ventricle has normal function. The left ventricle has no regional wall motion abnormalities. Definity  contrast agent was given IV to delineate the left ventricular  endocardial borders. The left ventricular internal cavity size was normal in size. There is no left ventricular hypertrophy. Left ventricular diastolic parameters were normal. Normal left ventricular filling pressure. Right Ventricle: The right ventricular size is normal. No increase in right ventricular wall thickness. Right ventricular systolic function is normal. There is mildly elevated pulmonary artery systolic pressure. The tricuspid regurgitant velocity is 2.49  m/s, and with an assumed right atrial pressure of 15 mmHg, the estimated right ventricular systolic pressure is 39.8 mmHg. Left Atrium: Left atrial size was normal in size. Right Atrium: Right atrial size was normal in size. Pericardium: There is no evidence of pericardial effusion. Mitral Valve: The mitral valve is normal in structure. No evidence of mitral valve regurgitation. No evidence of mitral valve stenosis. Tricuspid Valve: The tricuspid valve is normal in structure. Tricuspid valve regurgitation is not demonstrated. No evidence of tricuspid stenosis. Aortic Valve: The aortic valve is  tricuspid. Aortic valve regurgitation is not visualized. No aortic stenosis is present. Aortic valve mean gradient measures 5.0 mmHg. Aortic valve peak gradient measures 10.8 mmHg. Aortic valve area, by VTI measures 4.15  cm. Pulmonic Valve: The pulmonic valve was not well visualized. Pulmonic valve regurgitation is not visualized. No evidence of pulmonic stenosis. Aorta: The aortic root and ascending aorta are structurally normal, with no evidence of dilitation. Venous: The inferior vena cava is dilated in size with less than 50% respiratory variability, suggesting right atrial pressure of 15 mmHg. IAS/Shunts: No atrial level shunt detected by color flow Doppler.  LEFT VENTRICLE PLAX 2D LVIDd:         5.00 cm   Diastology LVIDs:         3.30 cm   LV e' medial:    12.30 cm/s LV PW:         1.00 cm   LV E/e' medial:  7.9 LV IVS:        1.10 cm   LV e' lateral:   12.10 cm/s LVOT diam:     2.30 cm   LV E/e' lateral: 8.0 LV SV:         111 LV SV Index:   44 LVOT Area:     4.15 cm  RIGHT VENTRICLE             IVC RV S prime:     14.70 cm/s  IVC diam: 2.30 cm TAPSE (M-mode): 2.4 cm LEFT ATRIUM             Index        RIGHT ATRIUM           Index LA Vol (A2C):   62.0 ml 24.56 ml/m  RA Area:     16.70 cm LA Vol (A4C):   73.2 ml 29.00 ml/m  RA Volume:   41.20 ml  16.32 ml/m LA Biplane Vol: 68.9 ml 27.29 ml/m  AORTIC VALVE AV Area (Vmax):    2.74 cm AV Area (Vmean):   2.77 cm AV Area (VTI):     4.15 cm AV Vmax:  164.00 cm/s AV Vmean:          108.000 cm/s AV VTI:            0.267 m AV Peak Grad:      10.8 mmHg AV Mean Grad:      5.0 mmHg LVOT Vmax:         108.00 cm/s LVOT Vmean:        72.000 cm/s LVOT VTI:          0.267 m LVOT/AV VTI ratio: 1.00  AORTA Ao Root diam: 2.80 cm Ao Asc diam:  2.90 cm MITRAL VALVE               TRICUSPID VALVE MV Area (PHT): 5.66 cm    TR Peak grad:   24.8 mmHg MV Decel Time: 134 msec    TR Vmax:        249.00 cm/s MV E velocity: 96.90 cm/s MV A velocity: 99.20 cm/s  SHUNTS  MV E/A ratio:  0.98        Systemic VTI:  0.27 m                            Systemic Diam: 2.30 cm Mihai Croitoru MD Electronically signed by Jerel Balding MD Signature Date/Time: 02/23/2024/2:04:17 PM    Final     Scheduled Meds:  atorvastatin   20 mg Oral QHS   Chlorhexidine  Gluconate Cloth  6 each Topical Q0600   heparin  injection (subcutaneous)  5,000 Units Subcutaneous Q8H   insulin  aspart  0-5 Units Subcutaneous QHS   insulin  aspart  0-9 Units Subcutaneous TID WC   insulin  glargine-yfgn  20 Units Subcutaneous BID   metoCLOPramide  (REGLAN ) injection  5 mg Intravenous Q8H   metoprolol  tartrate  25 mg Oral BID   multivitamin with minerals  1 tablet Oral Daily   mupirocin  ointment  1 Application Nasal BID   nutrition supplement (JUVEN)  1 packet Oral BID BM   pantoprazole  (PROTONIX ) IV  40 mg Intravenous Q12H   Continuous Infusions:  cefTRIAXone  (ROCEPHIN )  IV Stopped (02/23/24 0936)     LOS: 3 days   Ivonne Mustache, MD [facility] P7/16/2025, 7:55 AM

## 2024-02-24 NOTE — Plan of Care (Signed)
   Problem: Education: Goal: Knowledge of General Education information will improve Description Including pain rating scale, medication(s)/side effects and non-pharmacologic comfort measures Outcome: Progressing   Problem: Health Behavior/Discharge Planning: Goal: Ability to manage health-related needs will improve Outcome: Progressing

## 2024-02-25 ENCOUNTER — Inpatient Hospital Stay (HOSPITAL_COMMUNITY): Admitting: Anesthesiology

## 2024-02-25 ENCOUNTER — Encounter (HOSPITAL_COMMUNITY): Payer: Self-pay | Admitting: Pediatrics

## 2024-02-25 ENCOUNTER — Encounter (HOSPITAL_COMMUNITY)
Admission: EM | Disposition: A | Discharge status: Skilled Nursing Facility | Payer: Self-pay | Source: Home / Self Care | Attending: Family Medicine

## 2024-02-25 DIAGNOSIS — Z1152 Encounter for screening for COVID-19: Secondary | ICD-10-CM | POA: Diagnosis not present

## 2024-02-25 DIAGNOSIS — E43 Unspecified severe protein-calorie malnutrition: Secondary | ICD-10-CM | POA: Diagnosis not present

## 2024-02-25 DIAGNOSIS — L97929 Non-pressure chronic ulcer of unspecified part of left lower leg with unspecified severity: Secondary | ICD-10-CM | POA: Diagnosis not present

## 2024-02-25 DIAGNOSIS — G9341 Metabolic encephalopathy: Secondary | ICD-10-CM | POA: Diagnosis not present

## 2024-02-25 DIAGNOSIS — K208 Other esophagitis without bleeding: Secondary | ICD-10-CM

## 2024-02-25 DIAGNOSIS — K3189 Other diseases of stomach and duodenum: Secondary | ICD-10-CM

## 2024-02-25 DIAGNOSIS — K2980 Duodenitis without bleeding: Secondary | ICD-10-CM

## 2024-02-25 DIAGNOSIS — R6520 Severe sepsis without septic shock: Secondary | ICD-10-CM | POA: Diagnosis not present

## 2024-02-25 DIAGNOSIS — K2210 Ulcer of esophagus without bleeding: Secondary | ICD-10-CM

## 2024-02-25 DIAGNOSIS — L02612 Cutaneous abscess of left foot: Secondary | ICD-10-CM | POA: Diagnosis not present

## 2024-02-25 DIAGNOSIS — I70262 Atherosclerosis of native arteries of extremities with gangrene, left leg: Secondary | ICD-10-CM | POA: Diagnosis not present

## 2024-02-25 DIAGNOSIS — M86272 Subacute osteomyelitis, left ankle and foot: Secondary | ICD-10-CM | POA: Diagnosis not present

## 2024-02-25 DIAGNOSIS — I10 Essential (primary) hypertension: Secondary | ICD-10-CM

## 2024-02-25 DIAGNOSIS — A401 Sepsis due to streptococcus, group B: Secondary | ICD-10-CM | POA: Diagnosis not present

## 2024-02-25 DIAGNOSIS — E1110 Type 2 diabetes mellitus with ketoacidosis without coma: Secondary | ICD-10-CM | POA: Diagnosis not present

## 2024-02-25 DIAGNOSIS — E119 Type 2 diabetes mellitus without complications: Secondary | ICD-10-CM | POA: Diagnosis not present

## 2024-02-25 DIAGNOSIS — L03116 Cellulitis of left lower limb: Secondary | ICD-10-CM | POA: Diagnosis not present

## 2024-02-25 DIAGNOSIS — M86162 Other acute osteomyelitis, left tibia and fibula: Secondary | ICD-10-CM | POA: Diagnosis not present

## 2024-02-25 DIAGNOSIS — T182XXA Foreign body in stomach, initial encounter: Secondary | ICD-10-CM

## 2024-02-25 LAB — BASIC METABOLIC PANEL WITH GFR
Anion gap: 10 (ref 5–15)
BUN: 21 mg/dL — ABNORMAL HIGH (ref 6–20)
CO2: 23 mmol/L (ref 22–32)
Calcium: 9 mg/dL (ref 8.9–10.3)
Chloride: 111 mmol/L (ref 98–111)
Creatinine, Ser: 1.04 mg/dL (ref 0.61–1.24)
GFR, Estimated: >60 mL/min (ref >60)
Glucose, Bld: 213 mg/dL — ABNORMAL HIGH (ref 70–99)
Potassium: 3.4 mmol/L — ABNORMAL LOW (ref 3.5–5.1)
Sodium: 144 mmol/L (ref 135–145)

## 2024-02-25 LAB — GLUCOSE, CAPILLARY
Glucose-Capillary: 205 mg/dL — ABNORMAL HIGH (ref 70–99)
Glucose-Capillary: 158 mg/dL — ABNORMAL HIGH (ref 70–99)
Glucose-Capillary: 219 mg/dL — ABNORMAL HIGH (ref 70–99)
Glucose-Capillary: 214 mg/dL — ABNORMAL HIGH (ref 70–99)

## 2024-02-25 LAB — CBC
HCT: 27.6 % — ABNORMAL LOW (ref 39.0–52.0)
Hemoglobin: 8.9 g/dL — ABNORMAL LOW (ref 13.0–17.0)
MCH: 27.1 pg (ref 26.0–34.0)
MCHC: 32.2 g/dL (ref 30.0–36.0)
MCV: 84.1 fL (ref 80.0–100.0)
Platelets: 334 K/uL (ref 150–400)
RBC: 3.28 MIL/uL — ABNORMAL LOW (ref 4.22–5.81)
RDW: 17.2 % — ABNORMAL HIGH (ref 11.5–15.5)
WBC: 11.1 K/uL — ABNORMAL HIGH (ref 4.0–10.5)
nRBC: 0.5 % — ABNORMAL HIGH (ref 0.0–0.2)

## 2024-02-25 LAB — PHOSPHORUS: Phosphorus: 1.5 mg/dL — ABNORMAL LOW (ref 2.5–4.6)

## 2024-02-25 SURGERY — EGD (ESOPHAGOGASTRODUODENOSCOPY)
Anesthesia: Monitor Anesthesia Care

## 2024-02-25 MED ORDER — LIDOCAINE 2% (20 MG/ML) 5 ML SYRINGE
INTRAMUSCULAR | Status: DC | PRN
  Administered 2024-02-25: 60 mg via INTRAVENOUS

## 2024-02-25 MED ORDER — POTASSIUM CHLORIDE CRYS ER 20 MEQ PO TBCR
40.0000 meq | EXTENDED_RELEASE_TABLET | Freq: Once | ORAL | Status: AC
  Administered 2024-02-25: 40 meq via ORAL
  Filled 2024-02-25: qty 2

## 2024-02-25 MED ORDER — METOPROLOL TARTRATE 50 MG PO TABS: 50.0000 mg | ORAL_TABLET | Freq: Two times a day (BID) | ORAL | Status: DC

## 2024-02-25 MED ORDER — PANTOPRAZOLE SODIUM 40 MG PO TBEC
40.0000 mg | DELAYED_RELEASE_TABLET | Freq: Every day | ORAL | Status: DC
  Administered 2024-02-27 – 2024-03-04 (×7): 40 mg via ORAL
  Filled 2024-02-25 (×8): qty 1

## 2024-02-25 MED ORDER — K PHOS MONO-SOD PHOS DI & MONO 155-852-130 MG PO TABS
500.0000 mg | ORAL_TABLET | Freq: Three times a day (TID) | ORAL | Status: DC
  Administered 2024-02-25 – 2024-02-26 (×4): 500 mg via ORAL
  Filled 2024-02-25 (×4): qty 2

## 2024-02-25 MED ORDER — SODIUM CHLORIDE 0.9 % IV SOLN: INTRAVENOUS | Status: DC

## 2024-02-25 MED ORDER — METOPROLOL TARTRATE 50 MG PO TABS
50.0000 mg | ORAL_TABLET | Freq: Two times a day (BID) | ORAL | Status: DC
  Administered 2024-02-25 – 2024-03-04 (×16): 50 mg via ORAL
  Filled 2024-02-25 (×17): qty 1

## 2024-02-25 MED ORDER — K PHOS MONO-SOD PHOS DI & MONO 155-852-130 MG PO TABS: 500.0000 mg | ORAL_TABLET | Freq: Three times a day (TID) | ORAL | Status: DC

## 2024-02-25 MED ORDER — ALUM & MAG HYDROXIDE-SIMETH 200-200-20 MG/5ML PO SUSP
15.0000 mL | Freq: Four times a day (QID) | ORAL | Status: DC | PRN
  Administered 2024-02-25: 15 mL via ORAL
  Filled 2024-02-25: qty 30

## 2024-02-25 MED ORDER — ESMOLOL HCL 100 MG/10ML IV SOLN
INTRAVENOUS | Status: DC | PRN
Start: 2024-02-25 — End: 2024-02-25
  Administered 2024-02-25 (×2): 20 mg via INTRAVENOUS

## 2024-02-25 MED ORDER — POTASSIUM CHLORIDE CRYS ER 20 MEQ PO TBCR: 40.0000 meq | EXTENDED_RELEASE_TABLET | Freq: Once | ORAL | Status: DC

## 2024-02-25 MED ORDER — POTASSIUM CHLORIDE 10 MEQ/100ML IV SOLN: 10.0000 meq | INTRAVENOUS | Status: DC

## 2024-02-25 MED ORDER — SODIUM CHLORIDE 0.9 % IV SOLN
INTRAVENOUS | Status: DC | PRN
Start: 2024-02-25 — End: 2024-02-25

## 2024-02-25 MED ORDER — POTASSIUM PHOSPHATES 15 MMOLE/5ML IV SOLN
30.0000 mmol | Freq: Once | INTRAVENOUS | Status: DC
  Filled 2024-02-25: qty 10

## 2024-02-25 MED ORDER — PROPOFOL 10 MG/ML IV BOLUS
INTRAVENOUS | Status: DC | PRN
  Administered 2024-02-25: 40 mg via INTRAVENOUS
  Administered 2024-02-25: 100 ug/kg/min via INTRAVENOUS
  Administered 2024-02-25: 50 mg via INTRAVENOUS

## 2024-02-25 NOTE — Anesthesia Postprocedure Evaluation (Signed)
 Anesthesia Post Note  Patient: Nikita Humble  Procedure(s) Performed: EGD (ESOPHAGOGASTRODUODENOSCOPY)     Patient location during evaluation: PACU Anesthesia Type: MAC Level of consciousness: awake and alert Pain management: pain level controlled Vital Signs Assessment: post-procedure vital signs reviewed and stable Respiratory status: spontaneous breathing, nonlabored ventilation and respiratory function stable Cardiovascular status: blood pressure returned to baseline and stable Postop Assessment: no apparent nausea or vomiting Anesthetic complications: no   No notable events documented.  Last Vitals:  Vitals:   02/25/24 1220 02/25/24 1230  BP: 132/85 136/85  Pulse: (!) 124 (!) 125  Resp: 19 14  Temp:    SpO2: 98% 96%    Last Pain:  Vitals:   02/25/24 1230  TempSrc:   PainSc: 0-No pain                 Almarie CHRISTELLA Marchi

## 2024-02-25 NOTE — Plan of Care (Signed)
  Problem: Pain Managment: Goal: General experience of comfort will improve and/or be controlled Outcome: Progressing   Problem: Safety: Goal: Ability to remain free from injury will improve Outcome: Progressing

## 2024-02-25 NOTE — Plan of Care (Signed)

## 2024-02-25 NOTE — Anesthesia Preprocedure Evaluation (Signed)
 Anesthesia Evaluation  Patient identified by MRN, date of birth, ID band Patient awake    Reviewed: Allergy & Precautions, NPO status , Patient's Chart, lab work & pertinent test results  History of Anesthesia Complications Negative for: history of anesthetic complications  Airway Mallampati: III  TM Distance: >3 FB Neck ROM: Full    Dental  (+) Dental Advisory Given, Missing,    Pulmonary neg shortness of breath, neg sleep apnea, neg COPD, neg recent URI   breath sounds clear to auscultation       Cardiovascular hypertension, Pt. on medications and Pt. on home beta blockers (-) angina (-) Past MI  Rhythm:Regular Rate:Tachycardia  1. Left ventricular ejection fraction, by estimation, is 55 to 60%. The  left ventricle has normal function. The left ventricle has no regional  wall motion abnormalities. Left ventricular diastolic parameters were  normal.   2. Right ventricular systolic function is normal. The right ventricular  size is normal. There is mildly elevated pulmonary artery systolic  pressure. The estimated right ventricular systolic pressure is 39.8 mmHg.   3. The mitral valve is normal in structure. No evidence of mitral valve  regurgitation. No evidence of mitral stenosis.   4. The aortic valve is tricuspid. Aortic valve regurgitation is not  visualized. No aortic stenosis is present.   5. The inferior vena cava is dilated in size with <50% respiratory  variability, suggesting right atrial pressure of 15 mmHg.      Neuro/Psych  PSYCHIATRIC DISORDERS  Depression    negative neurological ROS     GI/Hepatic Neg liver ROS,GERD  Medicated,,  Endo/Other  diabetes, Poorly Controlled, Insulin  Dependent  Class 3 obesityLab Results      Component                Value               Date                      HGBA1C                   10.9 (H)            02/22/2024             Renal/GU negative Renal ROSLab Results       Component                Value               Date                      NA                       144                 02/25/2024                K                        3.4 (L)             02/25/2024                CO2                      23  02/25/2024                GLUCOSE                  213 (H)             02/25/2024                BUN                      21 (H)              02/25/2024                CREATININE               1.04                02/25/2024                CALCIUM                   9.0                 02/25/2024                EGFR                     76                  10/10/2022                GFRNONAA                 >60                 02/25/2024                Musculoskeletal  (+) Arthritis ,    Abdominal   Peds  Hematology  (+) Blood dyscrasia, anemia Lab Results      Component                Value               Date                      WBC                      11.1 (H)            02/25/2024                HGB                      8.9 (L)             02/25/2024                HCT                      27.6 (L)            02/25/2024                MCV                      84.1                02/25/2024  PLT                      334                 02/25/2024              Anesthesia Other Findings   Reproductive/Obstetrics                              Anesthesia Physical Anesthesia Plan  ASA: 3  Anesthesia Plan: MAC   Post-op Pain Management: Minimal or no pain anticipated   Induction: Intravenous  PONV Risk Score and Plan: 1 and Propofol  infusion and Treatment may vary due to age or medical condition  Airway Management Planned: Nasal Cannula, Natural Airway and Simple Face Mask  Additional Equipment: None  Intra-op Plan:   Post-operative Plan:   Informed Consent: I have reviewed the patients History and Physical, chart, labs and discussed the procedure including the risks, benefits  and alternatives for the proposed anesthesia with the patient or authorized representative who has indicated his/her understanding and acceptance.     Dental advisory given  Plan Discussed with: CRNA  Anesthesia Plan Comments:          Anesthesia Quick Evaluation

## 2024-02-25 NOTE — H&P (Signed)
 [facility] History and Physical   Primary Care Physician:  Delbert Clam, MD   Reason for Procedure:  Iron deficiency anemia, coffee-ground emesis  Plan:    Upper endoscopy     HPI: Taylor Hughes is a 53 y.o. male undergoing upper endoscopy for investigation of iron deficiency anemia and coffee-ground emesis.  Patient was admitted to the hospital in DKA resolved.  In the setting of DKA he had repeated episodes of vomiting including coffee-ground emesis.  Coffee-ground emesis resolved and hemoglobin has not significantly dropped since being in the hospital.  Noted that patient has had iron deficiency anemia.  Has a history of gastroparesis but no other chronic chronic GI issues.  Colonoscopy was performed recently in December 2024 and showed 1 small hyperplastic polyp but no findings that would account for iron deficiency anemia.  Patient is not on anticoagulation.   Past Medical History:  Diagnosis Date   Atrial fibrillation (HCC)    Diabetes mellitus without complication (HCC)    Type 2   GERD (gastroesophageal reflux disease)    Hyperlipidemia    Hypertension    Morbid (severe) obesity due to excess calories (HCC) 08/07/2023   bmi 44.63   Wears glasses    Wound, open, foot    left diabetic     Past Surgical History:  Procedure Laterality Date   AMPUTATION Left 08/20/2018   Procedure: LEFT FOOT IRRIGATON AND DEBRIDEMENT, 5TH RAY AMPUTATION;  Surgeon: Harden Jerona GAILS, MD;  Location: [facility] OR;  Service: Orthopedics;  Laterality: Left;   AMPUTATION Right 03/25/2023   Procedure: RIGHT TRANSMETATARSAL AMPUTATION;  Surgeon: Harden Jerona GAILS, MD;  Location: [facility] OR;  Service: Orthopedics;  Laterality: Right;   APPLICATION OF A-CELL OF CHEST/ABDOMEN N/A 05/30/2019   Procedure: Application Of A-Cell Of Chest;  Surgeon: Shyrl Linnie KIDD, MD;  Location: [facility] OR;  Service: Cardiothoracic;  Laterality: N/A;   APPLICATION OF WOUND VAC N/A 05/30/2019   Procedure: Application Of Wound  Vac;  Surgeon: Shyrl Linnie KIDD, MD;  Location: [facility] OR;  Service: Cardiothoracic;  Laterality: N/A;   FINGER SURGERY Right 2016   I&D  small finger   I & D EXTREMITY Left 07/30/2018   Procedure: IRRIGATION AND DEBRIDEMENT LEFT FOOT WITH POSSIBLE AMPUTATION OF FIFTH TOE;  Surgeon: Vernetta Lonni GRADE, MD;  Location: [facility] ORS;  Service: Orthopedics;  Laterality: Left;   INCISION AND DRAINAGE ABSCESS Left 01/19/2019   Procedure: INCISION AND DRAINAGE ABSCESS upper chest;  Surgeon: Carlie Clark, MD;  Location: [facility] OR;  Service: ENT;  Laterality: Left;   IRRIGATION AND DEBRIDEMENT STERNOCLAVICULAR JOINT-STERNUM AND RIBS N/A 05/30/2019   Procedure: IRRIGATION AND DEBRIDEMENT OF STERNOCLAVICULAR JOINT-STERNUM AND RIBS ;  Surgeon: Shyrl Linnie KIDD, MD;  Location: [facility] OR;  Service: Cardiothoracic;  Laterality: N/A;   MINOR IRRIGATION AND DEBRIDEMENT OF WOUND N/A 11/22/2018   Procedure: INCISION AND DRAINAGE OF NECK ABSCESS;  Surgeon: Carlie Clark, MD;  Location: [facility] ORS;  Service: ENT;  Laterality: N/A;    Prior to Admission medications   Medication Sig Start Date End Date Taking? Authorizing Provider  acetaminophen  (TYLENOL ) 500 MG tablet Take 1,000 mg by mouth 2 (two) times daily as needed for moderate pain (pain score 4-6), fever or headache.   Yes [provider]  atorvastatin  (LIPITOR) 20 MG tablet Take 1 tablet (20 mg total) by mouth daily. 12/03/23  Yes Danton Jon HERO, PA-C  gabapentin  (NEURONTIN ) 300 MG capsule Take 1 capsule (300 mg total) by mouth 2 (two) times daily. 10/06/23  Yes Newlin, Enobong, MD  insulin  glargine (LANTUS  SOLOSTAR) 100 UNIT/ML Solostar Pen Inject 27 Units into the skin daily. 02/04/24  Yes Newlin, Enobong, MD  insulin  lispro (HUMALOG  KWIKPEN) 100 UNIT/ML KwikPen Inject 0-12 units into the skin three times daily with meals. Per sliding scale. 01/22/24  Yes Newlin, Enobong, MD  metoprolol  tartrate (LOPRESSOR ) 50 MG tablet Take 1 tablet (50 mg total) by mouth 2  (two) times daily. 01/22/24  Yes Newlin, Enobong, MD  naloxone  (NARCAN ) nasal spray 4 mg/0.1 mL Place 1 spray into one nostril as needed for opioid emergency. May repeat every 2-3 min until responsive or EMS  arrives. 12/01/23  Yes   omeprazole  (PRILOSEC) 40 MG capsule Take 1 capsule (40 mg total) by mouth daily. 12/03/23  Yes Danton Jon HERO, PA-C  sildenafil  (VIAGRA ) 100 MG tablet Take 1 tablet (100 mg total) by mouth daily as needed for erectile dysfunction. At least 24 hours between doses 01/22/24  Yes Newlin, Enobong, MD  valsartan  (DIOVAN ) 160 MG tablet Take 1 tablet (160 mg total) by mouth daily. 12/03/23  Yes Danton Jon HERO, PA-C  Accu-Chek Softclix Lancets lancets Use to check blood sugar 3 times daily. 02/09/24   Newlin, Enobong, MD  Continuous Glucose Sensor (FREESTYLE LIBRE 3 SENSOR) MISC Use as directed .Place 1 sensor on the skin every 14 days. Use to check glucose continuously 12/03/23   Danton Jon HERO, PA-C  glucose blood (ACCU-CHEK GUIDE TEST) test strip use as directed 3 (three) times daily. 06/02/23   Newlin, Enobong, MD  furosemide  (LASIX ) 20 MG tablet Take 2 tablets (40 mg total) by mouth daily. Patient not taking: Reported on 08/07/2023 05/14/23 11/04/23  Newlin, Enobong, MD    Current Facility-Administered Medications  Medication Dose Route Frequency Provider Last Rate Last Admin   Ampicillin -Sulbactam (UNASYN ) 3 g in sodium chloride  0.9 % 100 mL IVPB  3 g Intravenous Q6H Dennise Kingsley, MD 200 mL/hr at 02/25/24 0600 3 g at 02/25/24 0600   atorvastatin  (LIPITOR) tablet 20 mg  20 mg Oral QHS Simpson, Paula B, NP   20 mg at 02/24/24 2145   Chlorhexidine  Gluconate Cloth 2 % PADS 6 each  6 each Topical Q0600 Maree Harder, MD   6 each at 02/25/24 9391   dextrose  50 % solution 0-50 mL  0-50 mL Intravenous PRN Ula Prentice SAUNDERS, MD       docusate sodium  (COLACE) capsule 100 mg  100 mg Oral BID PRN Gleason, Laura R, PA-C       ferrous sulfate  tablet 325 mg  325 mg Oral Q breakfast  Jillian Buttery, MD       heparin  injection 5,000 Units  5,000 Units Subcutaneous Q8H Simpson, Paula B, NP   5,000 Units at 02/25/24 9394   insulin  aspart (novoLOG ) injection 0-5 Units  0-5 Units Subcutaneous QHS Simpson, Paula B, NP   3 Units at 02/23/24 2230   insulin  aspart (novoLOG ) injection 0-9 Units  0-9 Units Subcutaneous TID WC Simpson, Paula B, NP   2 Units at 02/24/24 1714   insulin  glargine-yfgn (SEMGLEE ) injection 20 Units  20 Units Subcutaneous BID Simpson, Paula B, NP   20 Units at 02/24/24 2152   metoprolol  tartrate (LOPRESSOR ) injection 2.5-5 mg  2.5-5 mg Intravenous Q3H PRN Simpson, Paula B, NP   5 mg at 02/22/24 1429   metoprolol  tartrate (LOPRESSOR ) tablet 25 mg  25 mg Oral BID Simpson, Paula B, NP   25 mg at 02/25/24 0656   multivitamin with minerals tablet  1 tablet  1 tablet Oral Daily Mannam, Praveen, MD   1 tablet at 02/24/24 9166   mupirocin  ointment (BACTROBAN ) 2 % 1 Application  1 Application Nasal BID Maree Harder, MD   1 Application at 02/24/24 2154   nutrition supplement (JUVEN) (JUVEN) powder packet 1 packet  1 packet Oral BID BM Mannam, Praveen, MD       ondansetron  (ZOFRAN ) injection 4 mg  4 mg Intravenous Q6H PRN Paliwal, Aditya, MD   4 mg at 02/23/24 0035   Oral care mouth rinse  15 mL Mouth Rinse PRN Maree Harder, MD       pantoprazole  (PROTONIX ) injection 40 mg  40 mg Intravenous Q12H Antonetta Moccasin B, NP   40 mg at 02/24/24 2143   polyethylene glycol (MIRALAX  / GLYCOLAX ) packet 17 g  17 g Oral Daily PRN Gleason, Leita SAUNDERS, PA-C        Allergies as of 02/21/2024   (No Known Allergies)    Family History  Problem Relation Age of Onset   Diabetes Mother    Colon cancer Brother    Esophageal cancer Neg Hx    Rectal cancer Neg Hx    Stomach cancer Neg Hx     Social History   Socioeconomic History   Marital status: Divorced    Spouse name: Not on file   Number of children: Not on file   Years of education: Not on file   Highest education level: 11th  grade  Occupational History   Not on file  Tobacco Use   Smoking status: Never   Smokeless tobacco: Never  Vaping Use   Vaping status: Never Used  Substance and Sexual Activity   Alcohol use: No    Alcohol/week: 0.0 standard drinks of alcohol   Drug use: No   Sexual activity: Not on file  Other Topics Concern   Not on file  Social History Narrative   Not on file   Social Drivers of Health   Financial Resource Strain: Medium Risk (06/24/2023)   Overall Financial Resource Strain (CARDIA)    Difficulty of Paying Living Expenses: Somewhat hard  Food Insecurity: Food Insecurity Present (02/24/2024)   Hunger Vital Sign    Worried About Running Out of Food in the Last Year: Sometimes true    Ran Out of Food in the Last Year: Sometimes true  Transportation Needs: No Transportation Needs (02/24/2024)   PRAPARE - Administrator, Civil Service (Medical): No    Lack of Transportation (Non-Medical): No  Physical Activity: Unknown (06/24/2023)   Exercise Vital Sign    Days of Exercise per Week: 0 days    Minutes of Exercise per Session: Not on file  Stress: No Stress Concern Present (06/24/2023)   [employer] of Occupational Health - Occupational Stress Questionnaire    Feeling of Stress : Only a little  Social Connections: Socially Isolated (06/24/2023)   Social Connection and Isolation Panel    Frequency of Communication with Friends and Family: Twice a week    Frequency of Social Gatherings with Friends and Family: Once a week    Attends Religious Services: Never    Database administrator or Organizations: No    Attends Engineer, structural: Not on file    Marital Status: Divorced  Intimate Partner Violence: Not At Risk (02/24/2024)   Humiliation, Afraid, Rape, and Kick questionnaire    Fear of Current or Ex-Partner: No    Emotionally Abused: No    Physically  Abused: No    Sexually Abused: No    Review of Systems:  All other review of systems  negative except as mentioned in the HPI.  Physical Exam: Vital signs BP (!) 139/102   Pulse (!) 125   Temp 98.6 F (37 C) (Oral)   Resp 18   Ht 6' (1.829 m)   Wt 135.2 kg   SpO2 100%   BMI 40.42 kg/m   General:   Alert,  Well-developed, well-nourished, pleasant and cooperative in NAD Lungs:  Clear throughout to auscultation.   Heart:  Regular rate and rhythm; no murmurs, clicks, rubs,  or gallops. Abdomen:  Soft, nontender and nondistended. Normal bowel sounds.   Neuro/Psych:  Normal mood and affect. A and O x 3  Inocente Hausen, MD [facility]

## 2024-02-25 NOTE — Progress Notes (Signed)
 GI signoff note  Mr. Doering underwent EGD today which revealed LA grade C esophagitis as well as duodenitis.  Biopsies pending for H. pylori.  These findings likely explain the patient's recent presentation of coffee-ground emesis and potentially chronic anemia.  Recommend pantoprazole  40 mg p.o. twice daily to be continued at discharge with follow-up in the GI office.  Our team will coordinate GI office follow-up.  Patient will need follow-up EGD in 8 to 12 weeks for confirmation of healing of LA grade C esophagitis.  The GI team will sign off at this time.  Please feel free to contact our service with any additional questions or concerns.

## 2024-02-25 NOTE — H&P (View-Only) (Signed)
 Patient ID: Taylor Hughes, male   DOB: 1971-05-02, 53 y.o.   MRN: [id number] Reviewed with patient surgical options for the abscess and osteomyelitis of the left foot.  Patient states he would like to proceed with a left below-knee amputation tomorrow Friday.

## 2024-02-25 NOTE — Progress Notes (Signed)
   02/25/24 0547  Vitals  Temp 98.6 F (37 C)  Temp Source Oral  BP (!) 139/102  MAP (mmHg) 112  BP Location Right Arm  BP Method Automatic  Patient Position (if appropriate) Lying  Pulse Rate (!) 125  Pulse Rate Source Monitor  Resp 18  Level of Consciousness  Level of Consciousness Alert  MEWS COLOR  MEWS Score Color Yellow  Oxygen  Therapy  SpO2 100 %  O2 Device Room Air  MEWS Score  MEWS Temp 0  MEWS Systolic 0  MEWS Pulse 2  MEWS RR 0  MEWS LOC 0  MEWS Score 2     02/25/24 0547  Vitals  Temp 98.6 F (37 C)  Temp Source Oral  BP (!) 139/102  MAP (mmHg) 112  BP Location Right Arm  BP Method Automatic  Patient Position (if appropriate) Lying  Pulse Rate (!) 125  Pulse Rate Source Monitor  Resp 18  Level of Consciousness  Level of Consciousness Alert  MEWS COLOR  MEWS Score Color Yellow  Oxygen  Therapy  SpO2 100 %  O2 Device Room Air  MEWS Score  MEWS Temp 0  MEWS Systolic 0  MEWS Pulse 2  MEWS RR 0  MEWS LOC 0  MEWS Score 2

## 2024-02-25 NOTE — Progress Notes (Signed)
   02/25/24 0547  Vitals  Temp 98.6 F (37 C)  Temp Source Oral  BP (!) 139/102  MAP (mmHg) 112  BP Location Right Arm  BP Method Automatic  Patient Position (if appropriate) Lying  Pulse Rate (!) 125  Pulse Rate Source Monitor  Resp 18  Level of Consciousness  Level of Consciousness Alert  MEWS COLOR  MEWS Score Color Yellow  Oxygen  Therapy  SpO2 100 %  O2 Device Room Air  MEWS Score  MEWS Temp 0  MEWS Systolic 0  MEWS Pulse 2  MEWS RR 0  MEWS LOC 0  MEWS Score 2

## 2024-02-25 NOTE — Op Note (Signed)
 [facility] Patient Name: Taylor Hughes Procedure Date : 02/25/2024 MRN: [id number] Attending MD: Inocente Hausen , MD, [id number] Date of Birth: [birth year] CSN: [id number] Age: 53 Admit Type: Inpatient Procedure:                Upper GI endoscopy Indications:              Iron deficiency anemia, Coffee-ground emesis Providers:                Inocente Hausen, MD, Darleene Bare, RN, Corene Southgate,                            Technician Referring MD:              Medicines:                Monitored Anesthesia Care Complications:            No immediate complications. Estimated blood loss:                            Minimal. Estimated Blood Loss:     Estimated blood loss was minimal. Procedure:                Pre-Anesthesia Assessment:                           - Prior to the procedure, a History and Physical                            was performed, and patient medications and                            allergies were reviewed. The patient's tolerance of                            previous anesthesia was also reviewed. The risks                            and benefits of the procedure and the sedation                            options and risks were discussed with the patient.                            All questions were answered, and informed consent                            was obtained. Prior Anticoagulants: The patient has                            taken no anticoagulant or antiplatelet agents. ASA                            Grade Assessment: III - A patient with severe  systemic disease. After reviewing the risks and                            benefits, the patient was deemed in satisfactory                            condition to undergo the procedure.                           After obtaining informed consent, the endoscope was                            passed under direct vision. Throughout the                            procedure, the  patient's blood pressure, pulse, and                            oxygen  saturations were monitored continuously. The                            [id number]-H190 ([id number] Olympus endoscope was introduced                            through the mouth, and advanced to the second part                            of duodenum. The upper GI endoscopy was                            accomplished without difficulty. The patient                            tolerated the procedure well. Scope In: Scope Out: Findings:      The upper third of the esophagus was normal.      LA Grade C (one or more mucosal breaks continuous between tops of 2 or       more mucosal folds, less than 75% circumference) esophagitis with no       bleeding was found in the lower esophagus extending into the middle       esophagus.      A small amount of food (residue) was found in the gastric fundus.      Normal mucosa was found in the gastric body, in the gastric antrum, in       the cardia (on retroflexion) and in the gastric fundus (on       retroflexion). Biopsies were taken with a cold forceps for Helicobacter       pylori testing.      Scattered moderate inflammation characterized by erosions, erythema and       nodularity was found in the duodenal bulb. Biopsies were taken with a       cold forceps for histology.      The duodenal bulb and second portion of the duodenum were normal. Impression:               - Normal upper  third of esophagus.                           - LA Grade C erosive esophagitis with no bleeding.                            This is a likely source of the patient's previous                            coffee-ground emesis. This may also contribute to                            chronic anemia as patient may have chronic                            esophagitis related to gastroparesis.                           - A small amount of food (residue) in the stomach.                           - Normal mucosa was found  in the gastric body, in                            the antrum, in the cardia and in the gastric                            fundus. Biopsied.                           - Duodenitis. Biopsied. Duodenitis may also be                            contributing to patient's history of anemia.                           - Normal duodenal bulb and second portion of the                            duodenum. Recommendation:           - Return patient to hospital ward for ongoing care.                           - Await pathology results.                           - In the absence of active GI bleeding can                            discontinue IV PPI twice daily and transition to                            pantoprazole  40 mg p.o. twice daily given evidence  of erosive esophagitis and duodenitis                           - There was a small amount of retained food in the                            patient's stomach possibly related to history of                            gastroparesis. If he is experiencing symptoms                            related to gastroparesis can review his previous                            metoclopramide  dosing that was used in the                            outpatient setting.                           - Given finding of LA grade C esophagitis on upper                            endoscopy, patient will require repeat upper                            endoscopy in 8 to 12 weeks in the outpatient                            setting for confirmation of healing.                           - The findings and recommendations were discussed                            with the patient. Procedure Code(s):        --- Professional ---                           [phone], Esophagogastroduodenoscopy, flexible,                            transoral; with biopsy, single or multiple Diagnosis Code(s):        --- Professional ---                           K29.80,  Duodenitis without bleeding                           K20.80, Other esophagitis without bleeding                           D50.9, Iron deficiency anemia, unspecified  K92.0, Hematemesis CPT copyright 2022 [employer]. All rights reserved. The codes documented in this report are preliminary and upon coder review may  be revised to meet current compliance requirements. Inocente Hausen, MD 02/25/2024 12:12:21 PM This report has been signed electronically. Number of Addenda: 0

## 2024-02-25 NOTE — Consult Note (Signed)
 ORTHOPAEDIC CONSULTATION  REQUESTING PHYSICIAN: Jillian Buttery, MD  Chief Complaint: Purulent abscess lateral aspect left hindfoot.  HPI: Taylor Hughes is a 53 y.o. male who presents with a 1 month history of purulent draining from a new ulcer lateral left foot.  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  Past Medical History:  Diagnosis Date   Atrial fibrillation (HCC)    Diabetes mellitus without complication (HCC)    Type 2   GERD (gastroesophageal reflux disease)    Hyperlipidemia    Hypertension    Morbid (severe) obesity due to excess calories (HCC) 08/07/2023   bmi 44.63   Wears glasses    Wound, open, foot    left diabetic    Past Surgical History:  Procedure Laterality Date   AMPUTATION Left 08/20/2018   Procedure: LEFT FOOT IRRIGATON AND DEBRIDEMENT, 5TH RAY AMPUTATION;  Surgeon: Harden Jerona GAILS, MD;  Location: [facility] OR;  Service: Orthopedics;  Laterality: Left;   AMPUTATION Right 03/25/2023   Procedure: RIGHT TRANSMETATARSAL AMPUTATION;  Surgeon: Harden Jerona GAILS, MD;  Location: [facility] OR;  Service: Orthopedics;  Laterality: Right;   APPLICATION OF A-CELL OF CHEST/ABDOMEN N/A 05/30/2019   Procedure: Application Of A-Cell Of Chest;  Surgeon: Shyrl Linnie KIDD, MD;  Location: [facility] OR;  Service: Cardiothoracic;  Laterality: N/A;   APPLICATION OF WOUND VAC N/A 05/30/2019   Procedure: Application Of Wound Vac;  Surgeon: Shyrl Linnie KIDD, MD;  Location: [facility] OR;  Service: Cardiothoracic;  Laterality: N/A;   FINGER SURGERY Right 2016   I&D  small finger   I & D EXTREMITY Left 07/30/2018   Procedure: IRRIGATION AND DEBRIDEMENT LEFT FOOT WITH POSSIBLE AMPUTATION OF FIFTH TOE;  Surgeon: Vernetta Lonni GRADE, MD;  Location: [facility] ORS;  Service: Orthopedics;  Laterality: Left;   INCISION AND DRAINAGE ABSCESS Left 01/19/2019   Procedure: INCISION AND DRAINAGE  ABSCESS upper chest;  Surgeon: Carlie Clark, MD;  Location: [facility] OR;  Service: ENT;  Laterality: Left;   IRRIGATION AND DEBRIDEMENT STERNOCLAVICULAR JOINT-STERNUM AND RIBS N/A 05/30/2019   Procedure: IRRIGATION AND DEBRIDEMENT OF STERNOCLAVICULAR JOINT-STERNUM AND RIBS ;  Surgeon: Shyrl Linnie KIDD, MD;  Location: [facility] OR;  Service: Cardiothoracic;  Laterality: N/A;   MINOR IRRIGATION AND DEBRIDEMENT OF WOUND N/A 11/22/2018   Procedure: INCISION AND DRAINAGE OF NECK ABSCESS;  Surgeon: Carlie Clark, MD;  Location: [facility] ORS;  Service: ENT;  Laterality: N/A;   Social History   Socioeconomic History   Marital status: Divorced    Spouse name: Not on file   Number of children: Not on file   Years of education: Not on file   Highest education level: 11th grade  Occupational History   Not on file  Tobacco Use   Smoking status: Never   Smokeless tobacco: Never  Vaping Use   Vaping status: Never Used  Substance and Sexual Activity   Alcohol use: No    Alcohol/week: 0.0 standard drinks of alcohol   Drug use: No   Sexual activity: Not on file  Other Topics Concern   Not on file  Social History Narrative   Not on file   Social Drivers of Health   Financial Resource Strain: Medium Risk (06/24/2023)   Overall Financial Resource Strain (CARDIA)    Difficulty of Paying Living Expenses: Somewhat hard  Food Insecurity: Food Insecurity Present (02/24/2024)   Hunger  Vital Sign    Worried About Programme researcher, broadcasting/film/video in the Last Year: Sometimes true    Ran Out of Food in the Last Year: Sometimes true  Transportation Needs: No Transportation Needs (02/24/2024)   PRAPARE - Administrator, Civil Service (Medical): No    Lack of Transportation (Non-Medical): No  Physical Activity: Unknown (06/24/2023)   Exercise Vital Sign    Days of Exercise per Week: 0 days    Minutes of Exercise per Session: Not on file  Stress: No Stress Concern Present (06/24/2023)   [employer] of Occupational  Health - Occupational Stress Questionnaire    Feeling of Stress : Only a little  Social Connections: Socially Isolated (06/24/2023)   Social Connection and Isolation Panel    Frequency of Communication with Friends and Family: Twice a week    Frequency of Social Gatherings with Friends and Family: Once a week    Attends Religious Services: Never    Database administrator or Organizations: No    Attends Engineer, structural: Not on file    Marital Status: Divorced   Family History  Problem Relation Age of Onset   Diabetes Mother    Colon cancer Brother    Esophageal cancer Neg Hx    Rectal cancer Neg Hx    Stomach cancer Neg Hx    - negative except otherwise stated in the family history section No Known Allergies Prior to Admission medications   Medication Sig Start Date End Date Taking? Authorizing Provider  acetaminophen  (TYLENOL ) 500 MG tablet Take 1,000 mg by mouth 2 (two) times daily as needed for moderate pain (pain score 4-6), fever or headache.   Yes [provider]  atorvastatin  (LIPITOR) 20 MG tablet Take 1 tablet (20 mg total) by mouth daily. 12/03/23  Yes Danton Jon HERO, PA-C  gabapentin  (NEURONTIN ) 300 MG capsule Take 1 capsule (300 mg total) by mouth 2 (two) times daily. 10/06/23  Yes Newlin, Enobong, MD  insulin  glargine (LANTUS  SOLOSTAR) 100 UNIT/ML Solostar Pen Inject 27 Units into the skin daily. 02/04/24  Yes Newlin, Enobong, MD  insulin  lispro (HUMALOG  KWIKPEN) 100 UNIT/ML KwikPen Inject 0-12 units into the skin three times daily with meals. Per sliding scale. 01/22/24  Yes Newlin, Enobong, MD  metoprolol  tartrate (LOPRESSOR ) 50 MG tablet Take 1 tablet (50 mg total) by mouth 2 (two) times daily. 01/22/24  Yes Newlin, Enobong, MD  naloxone  (NARCAN ) nasal spray 4 mg/0.1 mL Place 1 spray into one nostril as needed for opioid emergency. May repeat every 2-3 min until responsive or EMS  arrives. 12/01/23  Yes   omeprazole  (PRILOSEC) 40 MG capsule Take 1  capsule (40 mg total) by mouth daily. 12/03/23  Yes Danton Jon HERO, PA-C  sildenafil  (VIAGRA ) 100 MG tablet Take 1 tablet (100 mg total) by mouth daily as needed for erectile dysfunction. At least 24 hours between doses 01/22/24  Yes Newlin, Enobong, MD  valsartan  (DIOVAN ) 160 MG tablet Take 1 tablet (160 mg total) by mouth daily. 12/03/23  Yes Danton Jon HERO, PA-C  Accu-Chek Softclix Lancets lancets Use to check blood sugar 3 times daily. 02/09/24   Newlin, Enobong, MD  Continuous Glucose Sensor (FREESTYLE LIBRE 3 SENSOR) MISC Use as directed .Place 1 sensor on the skin every 14 days. Use to check glucose continuously 12/03/23   Danton Jon HERO, PA-C  glucose blood (ACCU-CHEK GUIDE TEST) test strip use as directed 3 (three) times daily. 06/02/23   Newlin, Enobong,  MD  furosemide  (LASIX ) 20 MG tablet Take 2 tablets (40 mg total) by mouth daily. Patient not taking: Reported on 08/07/2023 05/14/23 11/04/23  Newlin, Enobong, MD   MR FOOT LEFT WO CONTRAST Result Date: 02/23/2024 CLINICAL DATA:  History of diabetes and plantar foot wound. Concern for soft tissue infection and osteomyelitis. EXAM: MRI OF THE LEFT FOOT WITHOUT CONTRAST TECHNIQUE: Multiplanar, multisequence MR imaging of the left hindfoot was performed. No intravenous contrast was administered. COMPARISON:  Radiographs 02/21/2024 and 07/30/2018. MRI 11/11/2023 and 08/02/2018. FINDINGS: Technical note: Despite efforts by the technologist and patient, mild motion artifact is present on today's exam and could not be eliminated. This reduces exam sensitivity and specificity. Patient was unable to complete the examination. No post-contrast imaging obtained. Bones/Joint/Cartilage Chronic plantar midfoot soft tissue ulceration appears progressive compared with the most recent MRI. Associated underlying soft tissue findings are further described below. There is new mild T2 hyperintensity within the cuboid without definite T1 marrow signal abnormality or  cortical destruction. 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 The distal tibia, distal fibula, talus and calcaneus are intact. No significant hindfoot joint effusions. Ligaments Chronic attenuation of the anterior talofibular ligament, similar to previous MRI. No acute ligamentous findings identified. Muscles and Tendons Grossly stable mild distal Achilles tendinosis without tear. The medial flexor and anterior extensor tendons appear normal. Mild flattening of the peroneus brevis tendon at the level of the lateral malleolus with heterogeneous fluid surrounding the tendon more distally at the level of the cuboid, further described below. Mild generalized muscular atrophy and edema. Soft tissues As above, chronic soft tissue ulceration along the plantar aspect of the midfoot which appears increased from previous MRI of 3 months ago. Lateral to the calcaneus, there is a new heterogeneous fluid collection which partially encases the distal peroneus brevis tendon and measures approximately 3.2 x 2.5 x 3.8 cm. No other focal fluid collections are identified. There is generalized soft tissue edema throughout the hindfoot. IMPRESSION: 1. Chronic soft tissue ulceration along the plantar aspect of the midfoot which appears progressive compared with previous MRI of 3 months ago. 2. New heterogeneous fluid collection lateral to the calcaneus which partially encases the distal peroneus brevis tendon, suspicious for an abscess or infected tenosynovitis. 3. New mild T2 hyperintensity within the cuboid without definite T1 marrow signal abnormality or cortical destruction, equivocal for early osteomyelitis versus reactive marrow edema. 4. 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 5. Generalized soft tissue edema throughout the hindfoot. Electronically Signed   By: Elsie Perone M.D.   On: 02/23/2024 14:52   ECHOCARDIOGRAM  COMPLETE Result Date: 02/23/2024    ECHOCARDIOGRAM REPORT   Patient Name:   KIP CROPP Date of Exam: 02/23/2024 Medical Rec #:  [id number]    Height:       72.0 in Accession #:    [id number]   Weight:       298.1 lb Date of Birth:  20-Oct-1970     BSA:          2.524 m Patient Age:    53 years     BP:           149/87 mmHg Patient Gender: M            HR:           100 bpm. Exam Location:  Inpatient Procedure: 2D Echo, Cardiac Doppler, Color Doppler and Intracardiac  Opacification Agent (Both Spectral and Color Flow Doppler were            utilized during procedure). Indications:    Endocarditis  History:        Patient has prior history of Echocardiogram examinations. GERD,                 Arrythmias:Atrial Fibrillation; Risk Factors:Hypertension and                 Diabetes.  Sonographer:    Christiana Mbomeh Referring Phys: [id number] PRAVEEN MANNAM IMPRESSIONS  1. Left ventricular ejection fraction, by estimation, is 55 to 60%. The left ventricle has normal function. The left ventricle has no regional wall motion abnormalities. Left ventricular diastolic parameters were normal.  2. Right ventricular systolic function is normal. The right ventricular size is normal. There is mildly elevated pulmonary artery systolic pressure. The estimated right ventricular systolic pressure is 39.8 mmHg.  3. The mitral valve is normal in structure. No evidence of mitral valve regurgitation. No evidence of mitral stenosis.  4. The aortic valve is tricuspid. Aortic valve regurgitation is not visualized. No aortic stenosis is present.  5. The inferior vena cava is dilated in size with <50% respiratory variability, suggesting right atrial pressure of 15 mmHg. Conclusion(s)/Recommendation(s): No evidence of valvular vegetations on this transthoracic echocardiogram. Consider a transesophageal echocardiogram to exclude infective endocarditis if clinically indicated. FINDINGS  Left Ventricle: Left ventricular ejection fraction, by  estimation, is 55 to 60%. The left ventricle has normal function. The left ventricle has no regional wall motion abnormalities. Definity  contrast agent was given IV to delineate the left ventricular  endocardial borders. The left ventricular internal cavity size was normal in size. There is no left ventricular hypertrophy. Left ventricular diastolic parameters were normal. Normal left ventricular filling pressure. Right Ventricle: The right ventricular size is normal. No increase in right ventricular wall thickness. Right ventricular systolic function is normal. There is mildly elevated pulmonary artery systolic pressure. The tricuspid regurgitant velocity is 2.49  m/s, and with an assumed right atrial pressure of 15 mmHg, the estimated right ventricular systolic pressure is 39.8 mmHg. Left Atrium: Left atrial size was normal in size. Right Atrium: Right atrial size was normal in size. Pericardium: There is no evidence of pericardial effusion. Mitral Valve: The mitral valve is normal in structure. No evidence of mitral valve regurgitation. No evidence of mitral valve stenosis. Tricuspid Valve: The tricuspid valve is normal in structure. Tricuspid valve regurgitation is not demonstrated. No evidence of tricuspid stenosis. Aortic Valve: The aortic valve is tricuspid. Aortic valve regurgitation is not visualized. No aortic stenosis is present. Aortic valve mean gradient measures 5.0 mmHg. Aortic valve peak gradient measures 10.8 mmHg. Aortic valve area, by VTI measures 4.15  cm. Pulmonic Valve: The pulmonic valve was not well visualized. Pulmonic valve regurgitation is not visualized. No evidence of pulmonic stenosis. Aorta: The aortic root and ascending aorta are structurally normal, with no evidence of dilitation. Venous: The inferior vena cava is dilated in size with less than 50% respiratory variability, suggesting right atrial pressure of 15 mmHg. IAS/Shunts: No atrial level shunt detected by color flow Doppler.   LEFT VENTRICLE PLAX 2D LVIDd:         5.00 cm   Diastology LVIDs:         3.30 cm   LV e' medial:    12.30 cm/s LV PW:         1.00 cm   LV E/e' medial:  7.9 LV IVS:        1.10 cm   LV e' lateral:   12.10 cm/s LVOT diam:     2.30 cm   LV E/e' lateral: 8.0 LV SV:         111 LV SV Index:   44 LVOT Area:     4.15 cm  RIGHT VENTRICLE             IVC RV S prime:     14.70 cm/s  IVC diam: 2.30 cm TAPSE (M-mode): 2.4 cm LEFT ATRIUM             Index        RIGHT ATRIUM           Index LA Vol (A2C):   62.0 ml 24.56 ml/m  RA Area:     16.70 cm LA Vol (A4C):   73.2 ml 29.00 ml/m  RA Volume:   41.20 ml  16.32 ml/m LA Biplane Vol: 68.9 ml 27.29 ml/m  AORTIC VALVE AV Area (Vmax):    2.74 cm AV Area (Vmean):   2.77 cm AV Area (VTI):     4.15 cm AV Vmax:           164.00 cm/s AV Vmean:          108.000 cm/s AV VTI:            0.267 m AV Peak Grad:      10.8 mmHg AV Mean Grad:      5.0 mmHg LVOT Vmax:         108.00 cm/s LVOT Vmean:        72.000 cm/s LVOT VTI:          0.267 m LVOT/AV VTI ratio: 1.00  AORTA Ao Root diam: 2.80 cm Ao Asc diam:  2.90 cm MITRAL VALVE               TRICUSPID VALVE MV Area (PHT): 5.66 cm    TR Peak grad:   24.8 mmHg MV Decel Time: 134 msec    TR Vmax:        249.00 cm/s MV E velocity: 96.90 cm/s MV A velocity: 99.20 cm/s  SHUNTS MV E/A ratio:  0.98        Systemic VTI:  0.27 m                            Systemic Diam: 2.30 cm Mihai Croitoru MD Electronically signed by Jerel Balding MD Signature Date/Time: 02/23/2024/2:04:17 PM    Final    - pertinent xrays, CT, MRI studies were reviewed and independently interpreted  Positive ROS: All other systems have been reviewed and were otherwise negative with the exception of those mentioned in the HPI and as above.  Physical Exam: General: Alert, no acute distress Psychiatric: Patient is competent for consent with normal mood and affect Lymphatic: No axillary or cervical lymphadenopathy Cardiovascular: No pedal edema Respiratory: No  cyanosis, no use of accessory musculature GI: No organomegaly, abdomen is soft and non-tender    Images:  @ENCIMAGES @  Labs:  Lab Results  Component Value Date   HGBA1C 10.9 (H) 02/22/2024   HGBA1C 9.1 (H) 11/11/2023   HGBA1C 7.9 (A) 06/25/2023   ESRSEDRATE >140 (H) 03/22/2023   CRP 9.7 (H) 03/29/2023   CRP 15.7 (H) 03/28/2023   CRP 19.2 (H) 03/27/2023   REPTSTATUS PENDING 02/22/2024   REPTSTATUS PENDING 02/22/2024   GRAMSTAIN NO  WBC SEEN RARE GRAM POSITIVE COCCI IN PAIRS  03/25/2023   CULT  02/22/2024    NO GROWTH 3 DAYS Performed at [facility], [address] [place], [place] [address]    CULT  02/22/2024    NO GROWTH 3 DAYS Performed at [facility], [address], [place], [place] [address]    LABORGA GROUP B STREP(S.AGALACTIAE)ISOLATED 02/21/2024    Lab Results  Component Value Date   ALBUMIN  1.7 (L) 02/22/2024   ALBUMIN  2.0 (L) 02/21/2024   ALBUMIN  1.8 (L) 11/12/2023        Latest Ref Rng & Units 02/25/2024    6:25 AM 02/24/2024    5:55 AM 02/23/2024    2:41 AM  CBC EXTENDED  WBC 4.0 - 10.5 K/uL 11.1  16.4  23.7   RBC 4.22 - 5.81 MIL/uL 3.28  3.18  3.21   Hemoglobin 13.0 - 17.0 g/dL 8.9  8.6  8.7   HCT 60.9 - 52.0 % 27.6  26.0  26.9   Platelets 150 - 400 K/uL 334  333  375     Neurologic: Patient does not have protective sensation bilateral lower extremities.   MUSCULOSKELETAL:   Skin: Examination patient has a fairly draining abscess with foul-smelling drainage from the base of the lateral aspect of the left foot.  The ulcer probes to the cuboid.  The chronic plantar Wagner grade 1 ulcer is stable with healthy granulation tissue.  Patient has a palpable dorsalis pedis pulse.  Review of the MRI scan shows edema within the cuboid abscess adjacent to the cuboid.  This is consistent with early osteomyelitis.  Patient does have lymphedema in both lower extremities.  Currently wrapped with Unna boot    Hemoglobin 8.9 with a white cell  count of 11.1.  Albumin  1.7.  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  Plan: Plan: I have recommended proceeding with a below-knee amputation on the left.  Discussed with the cuboid infection the abscess extending down to bone patient would not have a functional foot with attempted foot salvage intervention.  Patient states he understands will discuss this with family and proceed with surgery on Friday if patient elects for surgical intervention  Thank you for the consult and the opportunity to see Mr. Blase Beckner, MD [facility] [phone] 9:13 AM

## 2024-02-25 NOTE — Progress Notes (Signed)
 PT Cancellation Note  Patient Details Name: Taylor Hughes MRN: [id number] DOB: [birth year]   Cancelled Treatment:    Reason Eval/Treat Not Completed: Patient at procedure or test/unavailable (Pt off floor for EGD. Will follow-up for PT evaluation as schedule permits.)  Randall SAUNDERS, PT, DPT Acute Rehabilitation Services Office: [phone] Secure Chat Preferred  [name] 02/25/2024, 11:34 AM

## 2024-02-25 NOTE — Progress Notes (Signed)
 PROGRESS NOTE  Taylor Hughes  FMW:[id number] DOB: April 14, 1971 DOA: 02/21/2024 PCP: Delbert Clam, MD   Brief Narrative: Patient is a 53 year old male with history of paroxysmal atrial fibrillation, type 2 diabetes, DKA, gastroparesis, obesity who presented with altered mentation, hyperglycemia on 7/13.[name]  He was recently admitted for DKA in 4/25 and was treated for left foot infection as well.  Workup in the ED was consistent with DKA with glucose of more than 1000, potassium 6.9, bicarb of less than 7, creatinine of 3.2, pH of 6.8.  He was confused on presentation.  Left foot had a ulcer on the heel that was suspected  to be infected.  Started on empiric  antibiotics, IV insulin .  Patient was admitted to [facility] service.  Patient transferred to [facility] service on 7/16. Mentation has improved.  ID and orthopedics consulted for possible osteomyelitis of the left foot, plan for left BKA bilateral today.  EGD being done by GI today to evaluate for anemia, black tarry stool.  Assessment & Plan:  Principal Problem:   DKA (diabetic ketoacidosis) (HCC) Active Problems:   Acute on chronic anemia   Encephalopathy acute   Coffee ground emesis   Iron deficiency anemia   Cutaneous abscess of left foot  DKA: Initially started on insulin  drip.  DKA has resolved.  Currently on subcutaneous insulin .  A1c of 10.9 , consulted diabetic coordinator and following.  Sepsis secondary to left foot infection/B/L LE lymphedema: Presented with leukocytosis.  He has history of left foot infection in the past as well.  MRI was incomplete but showed  chronic soft tissue ulceration along the plantar aspect of the midfoot which appears progressive compared with previous MRI of 3 months ago,new heterogeneous fluid collection lateral to the calcaneus partially encasing  the distal peroneus brevis tendon, suspicious for an abscess or infected tenosynovitis.  Also showed bone marrow edema, could not rule out osteomyelitis.  Consulted  orthopedics, Dr Harden.  Continue broad-spectrum antibiotics for now.  He follows with Dr. Harden as an outpatient.  Plan for left BKA.  History of right transmetatarsal amputation  Bacteremia: One of the blood cultures sent on 7/13 showed gram-positive cocci in chains in both aerobic /anaerobic bottles.  Continue current antibiotic. Consulted  ID.  2D echo showed EF of 35 to 60%, no wall motion abnormality, no evidence of valvular vegetation.Plan for TEE  AKI/AGMA: Secondary to dehydration, DKA.  Currently resolved. Foley discontinued.  Phosphorus /potassium low, currently being supplemented  Acute metabolic encephalopathy: CT head negative for acute findings.  Improving mental status with correction of metabolic derangements, AKI, DKA.  Limit sedating medications.  This morning he is alert and oriented.  History of paroxysmal A-fib: Currently in normal sinus rhythm.  On metoprolol  at home.  He is not on anticoagulation at home.  Follows with PCP.  CHA2DS2-VASc score of 2  Hypertension: Monitor blood pressure.  Valsartan  on hold due to AKI  Hyperlipidemia: On statin  Anemia of chronic disease/iron deficiency: His hemoglobin was in the range of 11 few months ago.  Noticed to have black tarry stools during this hospitalization.  GI consulted.  Currently hemoglobin in the range of 8.  Severe iron deficiency with a level of 7.  On oral iron supplementation.  Continue IV Protonix   GERD/history of gastroparesis:  Gastric emptying scan on 1/24 showed delayed gastric emptying.  Currently on Protonix , Reglan , Zofran .    Obesity/deconditioning: BMI of 39.  PT consulted      Nutrition Problem: Inadequate oral intake Etiology:  chronic illness (gastroparesis and uncontrolled diabetes)    DVT prophylaxis:heparin  injection 5,000 Units Start: 02/23/24 1400 SCDs Start: 02/21/24 2325     Code Status: Full Code  Family Communication: Called and discussed with significant other [name] on phone on  7/16  Patient status:Inpatient  Patient is from :Home  Anticipated discharge un:[facility] vs SNF  Estimated DC date:2-3 days    Consultants: PCCM, GI, ID, orthopedics  Procedures: None yet  Antimicrobials:  Anti-infectives (From admission, onward)    Start     Dose/Rate Route Frequency Ordered Stop   02/24/24 1200  [MAR Hold]  Ampicillin -Sulbactam (UNASYN ) 3 g in sodium chloride  0.9 % 100 mL IVPB        (MAR Hold since Thu 02/25/2024 at 0959.Hold Reason: Transfer to a Procedural area)   3 g 200 mL/hr over 30 Minutes Intravenous Every 6 hours 02/24/24 1036     02/23/24 1000  vancomycin  (VANCOREADY) IVPB 1500 mg/300 mL  Status:  Discontinued        1,500 mg 150 mL/hr over 120 Minutes Intravenous Every 24 hours 02/22/24 0018 02/22/24 0830   02/22/24 1000  ceFEPIme  (MAXIPIME ) 2 g in sodium chloride  0.9 % 100 mL IVPB  Status:  Discontinued        2 g 200 mL/hr over 30 Minutes Intravenous Every 12 hours 02/22/24 0018 02/22/24 0830   02/22/24 1000  linezolid  (ZYVOX ) IVPB 600 mg  Status:  Discontinued        600 mg 300 mL/hr over 60 Minutes Intravenous Every 12 hours 02/22/24 0830 02/22/24 1111   02/22/24 0930  cefTRIAXone  (ROCEPHIN ) 2 g in sodium chloride  0.9 % 100 mL IVPB  Status:  Discontinued        2 g 200 mL/hr over 30 Minutes Intravenous Every 24 hours 02/22/24 0830 02/24/24 1036   02/21/24 2115  ceFEPIme  (MAXIPIME ) 2 g in sodium chloride  0.9 % 100 mL IVPB        2 g 200 mL/hr over 30 Minutes Intravenous  Once 02/21/24 2103 02/21/24 2213   02/21/24 2115  metroNIDAZOLE  (FLAGYL ) IVPB 500 mg        500 mg 100 mL/hr over 60 Minutes Intravenous  Once 02/21/24 2103 02/21/24 2257   02/21/24 2115  vancomycin  (VANCOCIN ) IVPB 1000 mg/200 mL premix  Status:  Discontinued        1,000 mg 200 mL/hr over 60 Minutes Intravenous  Once 02/21/24 2103 02/21/24 2106   02/21/24 2115  vancomycin  (VANCOREADY) IVPB 2000 mg/400 mL        2,000 mg 200 mL/hr over 120 Minutes Intravenous  Once 02/21/24  2106 02/22/24 0051       Subjective: Patient seen and examined at bedside today.  He looks much better than yesterday.  More coherent.  Alert and oriented.  Denies any new complaints.  Pain on the left heel is well-controlled.  He understands the plan for surgery.  He was about to go for EGD.  On room air.  Objective: Vitals:   02/25/24 0547 02/25/24 0656 02/25/24 0831 02/25/24 1000  BP: (!) 139/102 (!) 139/102 (!) 145/98 (!) 147/92  Pulse: (!) 125 (!) 125 (!) 117 (!) 116  Resp: 18  17 (!) 24  Temp: 98.6 F (37 C)  98.6 F (37 C) 97.7 F (36.5 C)  TempSrc: Oral  Oral Temporal  SpO2: 100%  100% 98%  Weight:      Height:        Intake/Output Summary (Last 24 hours) at 02/25/2024 1142  Last data filed at 02/25/2024 0548 Gross per 24 hour  Intake 420 ml  Output 2400 ml  Net -1980 ml   Filed Weights   02/22/24 0018 02/23/24 0500 02/25/24 0500  Weight: 130.6 kg 135.2 kg 135.2 kg    Examination: General exam: Overall comfortable, not in distress, morbidly obese, lying in bed, weak and deconditioned HEENT: PERRL Respiratory system:  no wheezes or crackles  Cardiovascular system: S1 & S2 heard, RRR.  Gastrointestinal system: Abdomen is nondistended, soft and nontender. Central nervous system: Alert and oriented Extremities: Lower extremity edema wrapped with dressing, ulcer on the left heel, right transmetatarsal amputation Skin: No rashes, no ulcers,no icterus     Data Reviewed: I have personally reviewed following labs and imaging studies  CBC: Recent Labs  Lab 02/21/24 2130 02/21/24 2134 02/22/24 0326 02/22/24 1030 02/22/24 1814 02/23/24 0241 02/24/24 0555 02/25/24 0625  WBC 25.9*  --  27.7* 26.0*  --  23.7* 16.4* 11.1*  NEUTROABS 24.5*  --   --   --   --   --   --   --   HGB 9.6*   < > 8.6* 8.6* 9.7* 8.7* 8.6* 8.9*  HCT 36.4*   < > 31.2* 28.3* 30.5* 26.9* 26.0* 27.6*  MCV 103.7*  --  100.3* 90.1  --  83.8 81.8 84.1  PLT 487*  --  383 396  --  375 333 334   <  > = values in this interval not displayed.   Basic Metabolic Panel: Recent Labs  Lab 02/22/24 1030 02/22/24 1421 02/22/24 2209 02/23/24 0241 02/23/24 0731 02/24/24 0555 02/24/24 1755 02/25/24 0625  NA 138   < > 142 142 142 146*  --  144  K 4.1   < > 3.5 3.8 4.2 3.7  --  3.4*  CL 103   < > 111 112* 111 112*  --  111  CO2 12*   < > 17* 18* 18* 21*  --  23  GLUCOSE 769*   < > 181* 169* 144* 360*  --  213*  BUN 49*   < > 48* 46* 43* 33*  --  21*  CREATININE 3.14*   < > 2.38* 1.97* 1.70* 1.20  --  1.04  CALCIUM  9.0   < > 9.3 9.2 9.2 9.3  --  9.0  MG 2.6*  --   --   --  2.3  --   --   --   PHOS 1.6*  --   --  <1.0*  --   --  1.2* 1.5*   < > = values in this interval not displayed.     Recent Results (from the past 240 hours)  Resp panel by RT-PCR (RSV, Flu A&B, Covid) Anterior Nasal Swab     Status: None   Collection Time: 02/21/24  9:03 PM   Specimen: Anterior Nasal Swab  Result Value Ref Range Status   SARS Coronavirus 2 by RT PCR NEGATIVE NEGATIVE Final   Influenza A by PCR NEGATIVE NEGATIVE Final   Influenza B by PCR NEGATIVE NEGATIVE Final    Comment: (NOTE) The Xpert Xpress SARS-CoV-2/FLU/RSV plus assay is intended as an aid in the diagnosis of influenza from Nasopharyngeal swab specimens and should not be used as a sole basis for treatment. Nasal washings and aspirates are unacceptable for Xpert Xpress SARS-CoV-2/FLU/RSV testing.  Fact Sheet for Patients: BloggerCourse.com  Fact Sheet for Healthcare Providers: SeriousBroker.it  This test is not yet approved or cleared by the  [place]  FDA and has been authorized for detection and/or diagnosis of SARS-CoV-2 by FDA under an Emergency Use Authorization (EUA). This EUA will remain in effect (meaning this test can be used) for the duration of the COVID-19 declaration under Section 564(b)(1) of the Act, 21 U.S.C. section 360bbb-3(b)(1), unless the authorization is  terminated or revoked.     Resp Syncytial Virus by PCR NEGATIVE NEGATIVE Final    Comment: (NOTE) Fact Sheet for Patients: BloggerCourse.com  Fact Sheet for Healthcare Providers: SeriousBroker.it  This test is not yet approved or cleared by the [place]  FDA and has been authorized for detection and/or diagnosis of SARS-CoV-2 by FDA under an Emergency Use Authorization (EUA). This EUA will remain in effect (meaning this test can be used) for the duration of the COVID-19 declaration under Section 564(b)(1) of the Act, 21 U.S.C. section 360bbb-3(b)(1), unless the authorization is terminated or revoked.  Performed at [facility], [address], [place], [place] [address]   Blood Culture (routine x 2)     Status: Abnormal   Collection Time: 02/21/24  9:03 PM   Specimen: BLOOD  Result Value Ref Range Status   Specimen Description BLOOD BLOOD RIGHT HAND  Final   Special Requests   Final    BOTTLES DRAWN AEROBIC AND ANAEROBIC Blood Culture results may not be optimal due to an inadequate volume of blood received in culture bottles   Culture  Setup Time   Final    GRAM POSITIVE COCCI IN CHAINS IN BOTH AEROBIC AND ANAEROBIC BOTTLES CRITICAL RESULT CALLED TO, READ BACK BY AND VERIFIED WITH: PHARMD EMILY S ON [id number] @1056  BY SM Performed at [facility], [address], [place], [place] [address]    Culture GROUP B STREP(S.AGALACTIAE)ISOLATED (A)  Final   Report Status 02/24/2024 FINAL  Final   Organism ID, Bacteria GROUP B STREP(S.AGALACTIAE)ISOLATED  Final      Susceptibility   Group b strep(s.agalactiae)isolated - MIC*    CLINDAMYCIN  >=1 RESISTANT Resistant     AMPICILLIN  <=0.25 SENSITIVE Sensitive     ERYTHROMYCIN >=8 RESISTANT Resistant     VANCOMYCIN  1 SENSITIVE Sensitive     CEFTRIAXONE  <=0.12 SENSITIVE Sensitive     LEVOFLOXACIN 0.5 SENSITIVE Sensitive     PENICILLIN  <=0.06 SENSITIVE Sensitive     * GROUP B  STREP(S.AGALACTIAE)ISOLATED  Blood Culture ID Panel (Reflexed)     Status: Abnormal   Collection Time: 02/21/24  9:03 PM  Result Value Ref Range Status   Enterococcus faecalis NOT DETECTED NOT DETECTED Final   Enterococcus Faecium NOT DETECTED NOT DETECTED Final   Listeria monocytogenes NOT DETECTED NOT DETECTED Final   Staphylococcus species NOT DETECTED NOT DETECTED Final   Staphylococcus aureus (BCID) NOT DETECTED NOT DETECTED Final   Staphylococcus epidermidis NOT DETECTED NOT DETECTED Final   Staphylococcus lugdunensis NOT DETECTED NOT DETECTED Final   Streptococcus species DETECTED (A) NOT DETECTED Final    Comment: CRITICAL RESULT CALLED TO, READ BACK BY AND VERIFIED WITH: PHARMD EMILY S ON [id number] @1056  BY SM    Streptococcus agalactiae DETECTED (A) NOT DETECTED Final    Comment: CRITICAL RESULT CALLED TO, READ BACK BY AND VERIFIED WITH: PHARMD EMILY S ON [id number] @1056  BY SM    Streptococcus pneumoniae NOT DETECTED NOT DETECTED Final   Streptococcus pyogenes NOT DETECTED NOT DETECTED Final   A.calcoaceticus-baumannii NOT DETECTED NOT DETECTED Final   Bacteroides fragilis NOT DETECTED NOT DETECTED Final   Enterobacterales NOT DETECTED NOT DETECTED Final  Enterobacter cloacae complex NOT DETECTED NOT DETECTED Final   Escherichia coli NOT DETECTED NOT DETECTED Final   Klebsiella aerogenes NOT DETECTED NOT DETECTED Final   Klebsiella oxytoca NOT DETECTED NOT DETECTED Final   Klebsiella pneumoniae NOT DETECTED NOT DETECTED Final   Proteus species NOT DETECTED NOT DETECTED Final   Salmonella species NOT DETECTED NOT DETECTED Final   Serratia marcescens NOT DETECTED NOT DETECTED Final   Haemophilus influenzae NOT DETECTED NOT DETECTED Final   Neisseria meningitidis NOT DETECTED NOT DETECTED Final   Pseudomonas aeruginosa NOT DETECTED NOT DETECTED Final   Stenotrophomonas maltophilia NOT DETECTED NOT DETECTED Final   Candida albicans NOT DETECTED NOT DETECTED Final   Candida auris  NOT DETECTED NOT DETECTED Final   Candida glabrata NOT DETECTED NOT DETECTED Final   Candida krusei NOT DETECTED NOT DETECTED Final   Candida parapsilosis NOT DETECTED NOT DETECTED Final   Candida tropicalis NOT DETECTED NOT DETECTED Final   Cryptococcus neoformans/gattii NOT DETECTED NOT DETECTED Final    Comment: Performed at [facility], [address] [place], [place] [address]  MRSA Next Gen by PCR, Nasal     Status: Abnormal   Collection Time: 02/21/24 11:25 PM   Specimen: Nasal Mucosa; Nasal Swab  Result Value Ref Range Status   MRSA by PCR Next Gen DETECTED (A) NOT DETECTED Final    Comment: RESULT CALLED TO, READ BACK BY AND VERIFIED WITH: WILLIAMSON RN 02/22/2024 @ 0205 BY AB (NOTE) The GeneXpert MRSA Assay (FDA approved for NASAL specimens only), is one component of a comprehensive MRSA colonization surveillance program. It is not intended to diagnose MRSA infection nor to guide or monitor treatment for MRSA infections. Test performance is not FDA approved in patients less than 56 years old. Performed at [facility], [address], [place], [place] [address]   Culture, blood (Routine X 2) w Reflex to ID Panel     Status: None (Preliminary result)   Collection Time: 02/22/24 10:30 AM   Specimen: BLOOD RIGHT HAND  Result Value Ref Range Status   Specimen Description BLOOD RIGHT HAND  Final   Special Requests   Final    BOTTLES DRAWN AEROBIC AND ANAEROBIC Blood Culture adequate volume   Culture   Final    NO GROWTH 3 DAYS Performed at [facility], [address], [place], [place] [address]    Report Status PENDING  Incomplete  Culture, blood (Routine X 2) w Reflex to ID Panel     Status: None (Preliminary result)   Collection Time: 02/22/24 10:30 AM   Specimen: BLOOD RIGHT HAND  Result Value Ref Range Status   Specimen Description BLOOD RIGHT HAND  Final   Special Requests   Final    BOTTLES DRAWN AEROBIC ONLY Blood Culture adequate volume    Culture   Final    NO GROWTH 3 DAYS Performed at [facility], [address], [place], [place] [address]    Report Status PENDING  Incomplete     Radiology Studies: MR FOOT LEFT WO CONTRAST Result Date: 02/23/2024 CLINICAL DATA:  History of diabetes and plantar foot wound. Concern for soft tissue infection and osteomyelitis. EXAM: MRI OF THE LEFT FOOT WITHOUT CONTRAST TECHNIQUE: Multiplanar, multisequence MR imaging of the left hindfoot was performed. No intravenous contrast was administered. COMPARISON:  Radiographs 02/21/2024 and 07/30/2018. MRI 11/11/2023 and 08/02/2018. FINDINGS: Technical note: Despite efforts by the technologist and patient, mild motion artifact is present on today's  exam and could not be eliminated. This reduces exam sensitivity and specificity. Patient was unable to complete the examination. No post-contrast imaging obtained. Bones/Joint/Cartilage Chronic plantar midfoot soft tissue ulceration appears progressive compared with the most recent MRI. Associated underlying soft tissue findings are further described below. There is new mild T2 hyperintensity within the cuboid without definite T1 marrow signal abnormality or cortical destruction. 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 The distal tibia, distal fibula, talus and calcaneus are intact. No significant hindfoot joint effusions. Ligaments Chronic attenuation of the anterior talofibular ligament, similar to previous MRI. No acute ligamentous findings identified. Muscles and Tendons Grossly stable mild distal Achilles tendinosis without tear. The medial flexor and anterior extensor tendons appear normal. Mild flattening of the peroneus brevis tendon at the level of the lateral malleolus with heterogeneous fluid surrounding the tendon more distally at the level of the cuboid, further described below. Mild generalized muscular atrophy and edema. Soft tissues As above, chronic  soft tissue ulceration along the plantar aspect of the midfoot which appears increased from previous MRI of 3 months ago. Lateral to the calcaneus, there is a new heterogeneous fluid collection which partially encases the distal peroneus brevis tendon and measures approximately 3.2 x 2.5 x 3.8 cm. No other focal fluid collections are identified. There is generalized soft tissue edema throughout the hindfoot. IMPRESSION: 1. Chronic soft tissue ulceration along the plantar aspect of the midfoot which appears progressive compared with previous MRI of 3 months ago. 2. New heterogeneous fluid collection lateral to the calcaneus which partially encases the distal peroneus brevis tendon, suspicious for an abscess or infected tenosynovitis. 3. New mild T2 hyperintensity within the cuboid without definite T1 marrow signal abnormality or cortical destruction, equivocal for early osteomyelitis versus reactive marrow edema. 4. 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 5. Generalized soft tissue edema throughout the hindfoot. Electronically Signed   By: Elsie Perone M.D.   On: 02/23/2024 14:52    Scheduled Meds:  [MAR Hold] atorvastatin   20 mg Oral QHS   [MAR Hold] Chlorhexidine  Gluconate Cloth  6 each Topical Q0600   [MAR Hold] ferrous sulfate   325 mg Oral Q breakfast   [MAR Hold] heparin  injection (subcutaneous)  5,000 Units Subcutaneous Q8H   [MAR Hold] insulin  aspart  0-5 Units Subcutaneous QHS   [MAR Hold] insulin  aspart  0-9 Units Subcutaneous TID WC   [MAR Hold] insulin  glargine-yfgn  20 Units Subcutaneous BID   [MAR Hold] metoprolol  tartrate  25 mg Oral BID   [MAR Hold] multivitamin with minerals  1 tablet Oral Daily   [MAR Hold] mupirocin  ointment  1 Application Nasal BID   [MAR Hold] nutrition supplement (JUVEN)  1 packet Oral BID BM   [MAR Hold] pantoprazole  (PROTONIX ) IV  40 mg Intravenous Q12H   Continuous Infusions:  sodium chloride      [MAR Hold]  ampicillin -sulbactam (UNASYN ) IV 3 g (02/25/24 0600)   [MAR Hold] potassium chloride      [MAR Hold] potassium PHOSPHATE  IVPB (in mmol)       LOS: 4 days   Ivonne Mustache, MD [facility] P7/17/2025, 11:42 AM

## 2024-02-25 NOTE — Transfer of Care (Signed)
 Immediate Anesthesia Transfer of Care Note  Patient: Taylor Hughes  Procedure(s) Performed: EGD (ESOPHAGOGASTRODUODENOSCOPY)  Patient Location: PACU and Endoscopy Unit  Anesthesia Type:MAC  Level of Consciousness: awake and alert   Airway & Oxygen  Therapy: Patient Spontanous Breathing  Post-op Assessment: Report given to RN and Post -op Vital signs reviewed and stable  Post vital signs: Reviewed and stable  Last Vitals:  Vitals Value Taken Time  BP 127/77 02/25/24 12:12  Temp    Pulse 121 02/25/24 12:14  Resp 12 02/25/24 12:14  SpO2 96 % 02/25/24 12:14  Vitals shown include unfiled device data.  Last Pain:  Vitals:   02/25/24 1000  TempSrc: Temporal  PainSc: 0-No pain         Complications: No notable events documented.

## 2024-02-25 NOTE — Progress Notes (Signed)
 Patient ID: Taylor Hughes, male   DOB: 1971-05-02, 53 y.o.   MRN: [id number] Reviewed with patient surgical options for the abscess and osteomyelitis of the left foot.  Patient states he would like to proceed with a left below-knee amputation tomorrow Friday.

## 2024-02-26 ENCOUNTER — Telehealth: Payer: Self-pay | Admitting: Family Medicine

## 2024-02-26 ENCOUNTER — Encounter (HOSPITAL_COMMUNITY)
Admission: EM | Disposition: A | Discharge status: Skilled Nursing Facility | Payer: Self-pay | Source: Home / Self Care | Attending: Family Medicine

## 2024-02-26 ENCOUNTER — Other Ambulatory Visit: Payer: Self-pay

## 2024-02-26 ENCOUNTER — Inpatient Hospital Stay (HOSPITAL_COMMUNITY): Admitting: Anesthesiology

## 2024-02-26 ENCOUNTER — Encounter (HOSPITAL_COMMUNITY): Payer: Self-pay

## 2024-02-26 DIAGNOSIS — M868X7 Other osteomyelitis, ankle and foot: Secondary | ICD-10-CM | POA: Diagnosis not present

## 2024-02-26 DIAGNOSIS — A401 Sepsis due to streptococcus, group B: Secondary | ICD-10-CM | POA: Diagnosis not present

## 2024-02-26 DIAGNOSIS — L97929 Non-pressure chronic ulcer of unspecified part of left lower leg with unspecified severity: Secondary | ICD-10-CM | POA: Diagnosis not present

## 2024-02-26 DIAGNOSIS — R6520 Severe sepsis without septic shock: Secondary | ICD-10-CM | POA: Diagnosis not present

## 2024-02-26 DIAGNOSIS — M869 Osteomyelitis, unspecified: Secondary | ICD-10-CM | POA: Diagnosis not present

## 2024-02-26 DIAGNOSIS — I10 Essential (primary) hypertension: Secondary | ICD-10-CM | POA: Diagnosis not present

## 2024-02-26 DIAGNOSIS — Z1152 Encounter for screening for COVID-19: Secondary | ICD-10-CM | POA: Diagnosis not present

## 2024-02-26 DIAGNOSIS — Z794 Long term (current) use of insulin: Secondary | ICD-10-CM | POA: Diagnosis not present

## 2024-02-26 DIAGNOSIS — M86162 Other acute osteomyelitis, left tibia and fibula: Secondary | ICD-10-CM | POA: Diagnosis not present

## 2024-02-26 DIAGNOSIS — Z89512 Acquired absence of left leg below knee: Secondary | ICD-10-CM | POA: Diagnosis not present

## 2024-02-26 DIAGNOSIS — M86272 Subacute osteomyelitis, left ankle and foot: Secondary | ICD-10-CM | POA: Diagnosis not present

## 2024-02-26 DIAGNOSIS — I70262 Atherosclerosis of native arteries of extremities with gangrene, left leg: Secondary | ICD-10-CM | POA: Diagnosis not present

## 2024-02-26 DIAGNOSIS — G8918 Other acute postprocedural pain: Secondary | ICD-10-CM | POA: Diagnosis not present

## 2024-02-26 DIAGNOSIS — I4891 Unspecified atrial fibrillation: Secondary | ICD-10-CM | POA: Diagnosis not present

## 2024-02-26 DIAGNOSIS — E1169 Type 2 diabetes mellitus with other specified complication: Secondary | ICD-10-CM | POA: Diagnosis not present

## 2024-02-26 DIAGNOSIS — E43 Unspecified severe protein-calorie malnutrition: Secondary | ICD-10-CM | POA: Diagnosis not present

## 2024-02-26 DIAGNOSIS — G9341 Metabolic encephalopathy: Secondary | ICD-10-CM | POA: Diagnosis not present

## 2024-02-26 DIAGNOSIS — L03116 Cellulitis of left lower limb: Secondary | ICD-10-CM | POA: Diagnosis not present

## 2024-02-26 DIAGNOSIS — L02612 Cutaneous abscess of left foot: Secondary | ICD-10-CM | POA: Diagnosis not present

## 2024-02-26 DIAGNOSIS — M86172 Other acute osteomyelitis, left ankle and foot: Secondary | ICD-10-CM | POA: Diagnosis not present

## 2024-02-26 DIAGNOSIS — E1110 Type 2 diabetes mellitus with ketoacidosis without coma: Secondary | ICD-10-CM | POA: Diagnosis not present

## 2024-02-26 LAB — CBC
HCT: 28.6 % — ABNORMAL LOW (ref 39.0–52.0)
Hemoglobin: 8.7 g/dL — ABNORMAL LOW (ref 13.0–17.0)
MCH: 26.5 pg (ref 26.0–34.0)
MCHC: 30.4 g/dL (ref 30.0–36.0)
MCV: 87.2 fL (ref 80.0–100.0)
Platelets: 331 K/uL (ref 150–400)
RBC: 3.28 MIL/uL — ABNORMAL LOW (ref 4.22–5.81)
RDW: 17.3 % — ABNORMAL HIGH (ref 11.5–15.5)
WBC: 9.2 K/uL (ref 4.0–10.5)
nRBC: 0.2 % (ref 0.0–0.2)

## 2024-02-26 LAB — PHOSPHORUS: Phosphorus: 2.1 mg/dL — ABNORMAL LOW (ref 2.5–4.6)

## 2024-02-26 LAB — GLUCOSE, CAPILLARY
Glucose-Capillary: 286 mg/dL — ABNORMAL HIGH (ref 70–99)
Glucose-Capillary: 264 mg/dL — ABNORMAL HIGH (ref 70–99)
Glucose-Capillary: 270 mg/dL — ABNORMAL HIGH (ref 70–99)
Glucose-Capillary: 299 mg/dL — ABNORMAL HIGH (ref 70–99)
Glucose-Capillary: 290 mg/dL — ABNORMAL HIGH (ref 70–99)
Glucose-Capillary: 394 mg/dL — ABNORMAL HIGH (ref 70–99)

## 2024-02-26 LAB — BASIC METABOLIC PANEL WITH GFR
Anion gap: 14 (ref 5–15)
BUN: 17 mg/dL (ref 6–20)
CO2: 15 mmol/L — ABNORMAL LOW (ref 22–32)
Calcium: 8.5 mg/dL — ABNORMAL LOW (ref 8.9–10.3)
Chloride: 112 mmol/L — ABNORMAL HIGH (ref 98–111)
Creatinine, Ser: 0.91 mg/dL (ref 0.61–1.24)
GFR, Estimated: >60 mL/min (ref >60)
Glucose, Bld: 281 mg/dL — ABNORMAL HIGH (ref 70–99)
Potassium: 4.2 mmol/L (ref 3.5–5.1)
Sodium: 141 mmol/L (ref 135–145)

## 2024-02-26 LAB — SURGICAL PATHOLOGY

## 2024-02-26 SURGERY — TRANSESOPHAGEAL ECHOCARDIOGRAM (TEE) (CATHLAB)
Anesthesia: Monitor Anesthesia Care

## 2024-02-26 SURGERY — AMPUTATION BELOW KNEE
Anesthesia: Regional | Site: Leg Lower | Laterality: Left

## 2024-02-26 MED ORDER — DEXMEDETOMIDINE HCL IN NACL 80 MCG/20ML IV SOLN
INTRAVENOUS | Status: AC
  Filled 2024-02-26: qty 20

## 2024-02-26 MED ORDER — SODIUM CHLORIDE 0.9% FLUSH: 3.0000 mL | INTRAVENOUS | Status: DC | PRN

## 2024-02-26 MED ORDER — ACETAMINOPHEN 500 MG PO TABS: 1000.0000 mg | ORAL_TABLET | Freq: Once | ORAL | Status: AC

## 2024-02-26 MED ORDER — CEFAZOLIN SODIUM-DEXTROSE 3-4 GM/150ML-% IV SOLN
INTRAVENOUS | Status: AC
  Filled 2024-02-26: qty 150

## 2024-02-26 MED ORDER — CHLORHEXIDINE GLUCONATE 4 % EX SOLN
60.0000 mL | Freq: Once | CUTANEOUS | Status: AC
  Administered 2024-02-26: 4 via TOPICAL
  Filled 2024-02-26: qty 60

## 2024-02-26 MED ORDER — EPHEDRINE SULFATE-NACL 50-0.9 MG/10ML-% IV SOSY
PREFILLED_SYRINGE | INTRAVENOUS | Status: DC | PRN
  Administered 2024-02-26: 10 mg via INTRAVENOUS

## 2024-02-26 MED ORDER — FENTANYL CITRATE (PF) 100 MCG/2ML IJ SOLN
25.0000 ug | INTRAMUSCULAR | Status: DC | PRN
  Administered 2024-02-26: 50 ug via INTRAVENOUS

## 2024-02-26 MED ORDER — PHENYLEPHRINE 80 MCG/ML (10ML) SYRINGE FOR IV PUSH (FOR BLOOD PRESSURE SUPPORT)
PREFILLED_SYRINGE | INTRAVENOUS | Status: DC | PRN
  Administered 2024-02-26: 160 ug via INTRAVENOUS
  Administered 2024-02-26: 80 ug via INTRAVENOUS
  Administered 2024-02-26: 160 ug via INTRAVENOUS
  Administered 2024-02-26: 80 ug via INTRAVENOUS
  Administered 2024-02-26 (×2): 160 ug via INTRAVENOUS

## 2024-02-26 MED ORDER — OXYCODONE HCL 5 MG PO TABS: 5.0000 mg | ORAL_TABLET | Freq: Once | ORAL | Status: DC | PRN

## 2024-02-26 MED ORDER — VANCOMYCIN HCL 1500 MG/300ML IV SOLN
1500.0000 mg | INTRAVENOUS | Status: AC
  Filled 2024-02-26: qty 300

## 2024-02-26 MED ORDER — INSULIN ASPART 100 UNIT/ML IJ SOLN
5.0000 [IU] | Freq: Once | INTRAMUSCULAR | Status: AC
  Administered 2024-02-26: 5 [IU] via SUBCUTANEOUS

## 2024-02-26 MED ORDER — ORAL CARE MOUTH RINSE: 15.0000 mL | Freq: Once | OROMUCOSAL | Status: DC

## 2024-02-26 MED ORDER — ACETAMINOPHEN 500 MG PO TABS
1000.0000 mg | ORAL_TABLET | Freq: Four times a day (QID) | ORAL | Status: DC
  Administered 2024-02-26 – 2024-02-27 (×3): 1000 mg via ORAL
  Filled 2024-02-26 (×3): qty 2

## 2024-02-26 MED ORDER — GLYCOPYRROLATE 0.2 MG/ML IJ SOLN
INTRAMUSCULAR | Status: DC | PRN
  Administered 2024-02-26: .2 mg via INTRAVENOUS

## 2024-02-26 MED ORDER — VANCOMYCIN HCL 1500 MG/300ML IV SOLN
INTRAVENOUS | Status: AC
  Administered 2024-02-26: 1500 mg via INTRAVENOUS
  Filled 2024-02-26: qty 300

## 2024-02-26 MED ORDER — 0.9 % SODIUM CHLORIDE (POUR BTL) OPTIME
TOPICAL | Status: DC | PRN
  Administered 2024-02-26: 1000 mL

## 2024-02-26 MED ORDER — PROPOFOL 10 MG/ML IV BOLUS
INTRAVENOUS | Status: AC
  Filled 2024-02-26: qty 20

## 2024-02-26 MED ORDER — FENTANYL CITRATE (PF) 100 MCG/2ML IJ SOLN: 50.0000 ug | Freq: Once | INTRAMUSCULAR | Status: DC

## 2024-02-26 MED ORDER — LOPERAMIDE HCL 2 MG PO CAPS: 2.0000 mg | ORAL_CAPSULE | ORAL | Status: DC | PRN

## 2024-02-26 MED ORDER — POVIDONE-IODINE 10 % EX SWAB: 2.0000 | Freq: Once | CUTANEOUS | Status: DC

## 2024-02-26 MED ORDER — POTASSIUM CHLORIDE CRYS ER 20 MEQ PO TBCR: 40.0000 meq | EXTENDED_RELEASE_TABLET | Freq: Every day | ORAL | Status: DC | PRN

## 2024-02-26 MED ORDER — LIDOCAINE 2% (20 MG/ML) 5 ML SYRINGE
INTRAMUSCULAR | Status: DC | PRN
  Administered 2024-02-26: 20 mg via INTRAVENOUS

## 2024-02-26 MED ORDER — DEXAMETHASONE SODIUM PHOSPHATE 10 MG/ML IJ SOLN
INTRAMUSCULAR | Status: DC | PRN
  Administered 2024-02-26: 5 mg via INTRAVENOUS

## 2024-02-26 MED ORDER — FENTANYL CITRATE (PF) 100 MCG/2ML IJ SOLN
INTRAMUSCULAR | Status: AC
  Administered 2024-02-26: 50 ug
  Filled 2024-02-26: qty 2

## 2024-02-26 MED ORDER — OXYCODONE HCL 5 MG/5ML PO SOLN: 5.0000 mg | Freq: Once | ORAL | Status: DC | PRN

## 2024-02-26 MED ORDER — HYDRALAZINE HCL 20 MG/ML IJ SOLN: 5.0000 mg | INTRAMUSCULAR | Status: DC | PRN

## 2024-02-26 MED ORDER — CEFAZOLIN SODIUM-DEXTROSE 3-4 GM/150ML-% IV SOLN
3.0000 g | INTRAVENOUS | Status: AC
  Administered 2024-02-26: 3 g via INTRAVENOUS
  Filled 2024-02-26: qty 150

## 2024-02-26 MED ORDER — OXYCODONE HCL 5 MG PO TABS
5.0000 mg | ORAL_TABLET | ORAL | Status: DC | PRN
  Administered 2024-02-27: 10 mg via ORAL
  Filled 2024-02-26: qty 2

## 2024-02-26 MED ORDER — CHLORHEXIDINE GLUCONATE 0.12 % MT SOLN
OROMUCOSAL | Status: AC
  Filled 2024-02-26: qty 15

## 2024-02-26 MED ORDER — MIDAZOLAM HCL 2 MG/2ML IJ SOLN
INTRAMUSCULAR | Status: AC
  Administered 2024-02-26: 2 mg via INTRAVENOUS
  Filled 2024-02-26: qty 2

## 2024-02-26 MED ORDER — CHLORHEXIDINE GLUCONATE 0.12 % MT SOLN: 15.0000 mL | Freq: Once | OROMUCOSAL | Status: DC

## 2024-02-26 MED ORDER — SODIUM CHLORIDE 0.9 % IV SOLN: 250.0000 mL | INTRAVENOUS | Status: AC | PRN

## 2024-02-26 MED ORDER — AMISULPRIDE (ANTIEMETIC) 5 MG/2ML IV SOLN: 10.0000 mg | Freq: Once | INTRAVENOUS | Status: DC | PRN

## 2024-02-26 MED ORDER — PHENYLEPHRINE 80 MCG/ML (10ML) SYRINGE FOR IV PUSH (FOR BLOOD PRESSURE SUPPORT)
PREFILLED_SYRINGE | INTRAVENOUS | Status: AC
  Filled 2024-02-26: qty 10

## 2024-02-26 MED ORDER — ACETAMINOPHEN 500 MG PO TABS
ORAL_TABLET | ORAL | Status: AC
  Administered 2024-02-26: 1000 mg via ORAL
  Filled 2024-02-26: qty 2

## 2024-02-26 MED ORDER — ACETAMINOPHEN 325 MG PO TABS
325.0000 mg | ORAL_TABLET | Freq: Four times a day (QID) | ORAL | Status: DC | PRN
  Administered 2024-02-29: 650 mg via ORAL
  Filled 2024-02-26: qty 2

## 2024-02-26 MED ORDER — MIDAZOLAM HCL 2 MG/2ML IJ SOLN: 2.0000 mg | Freq: Once | INTRAMUSCULAR | Status: AC

## 2024-02-26 MED ORDER — EPHEDRINE 5 MG/ML INJ
INTRAVENOUS | Status: AC
  Filled 2024-02-26: qty 5

## 2024-02-26 MED ORDER — LIDOCAINE 2% (20 MG/ML) 5 ML SYRINGE
INTRAMUSCULAR | Status: AC
  Filled 2024-02-26: qty 5

## 2024-02-26 MED ORDER — DEXAMETHASONE SODIUM PHOSPHATE 10 MG/ML IJ SOLN
INTRAMUSCULAR | Status: AC
  Filled 2024-02-26: qty 1

## 2024-02-26 MED ORDER — VASHE WOUND IRRIGATION OPTIME
TOPICAL | Status: DC | PRN
  Administered 2024-02-26: 34 [oz_av]

## 2024-02-26 MED ORDER — OXYCODONE HCL 5 MG PO TABS: 10.0000 mg | ORAL_TABLET | ORAL | Status: DC | PRN

## 2024-02-26 MED ORDER — PROPOFOL 10 MG/ML IV BOLUS
INTRAVENOUS | Status: DC | PRN
  Administered 2024-02-26: 50 mg via INTRAVENOUS
  Administered 2024-02-26: 150 mg via INTRAVENOUS

## 2024-02-26 MED ORDER — ONDANSETRON HCL 4 MG/2ML IJ SOLN
INTRAMUSCULAR | Status: AC
  Filled 2024-02-26: qty 2

## 2024-02-26 MED ORDER — DEXAMETHASONE SODIUM PHOSPHATE 10 MG/ML IJ SOLN
INTRAMUSCULAR | Status: AC
  Filled 2024-02-26: qty 2

## 2024-02-26 MED ORDER — LACTATED RINGERS IV SOLN: INTRAVENOUS | Status: DC

## 2024-02-26 MED ORDER — HYDROMORPHONE HCL 1 MG/ML IJ SOLN
0.5000 mg | INTRAMUSCULAR | Status: DC | PRN
  Administered 2024-02-26 – 2024-02-27 (×2): 1 mg via INTRAVENOUS
  Filled 2024-02-26 (×2): qty 1

## 2024-02-26 MED ORDER — SODIUM CHLORIDE 0.9% FLUSH
3.0000 mL | Freq: Two times a day (BID) | INTRAVENOUS | Status: DC
  Administered 2024-02-26 – 2024-03-03 (×11): 3 mL via INTRAVENOUS

## 2024-02-26 MED ORDER — RISAQUAD PO CAPS
2.0000 | ORAL_CAPSULE | Freq: Three times a day (TID) | ORAL | Status: DC
  Administered 2024-02-26 – 2024-03-04 (×21): 2 via ORAL
  Filled 2024-02-26 (×22): qty 2

## 2024-02-26 MED ORDER — DOCUSATE SODIUM 100 MG PO CAPS
100.0000 mg | ORAL_CAPSULE | Freq: Every day | ORAL | Status: DC
  Filled 2024-02-26 (×2): qty 1

## 2024-02-26 MED ORDER — ONDANSETRON HCL 4 MG/2ML IJ SOLN
INTRAMUSCULAR | Status: DC | PRN
  Administered 2024-02-26: 4 mg via INTRAVENOUS

## 2024-02-26 MED ORDER — FENTANYL CITRATE (PF) 100 MCG/2ML IJ SOLN
INTRAMUSCULAR | Status: AC
  Filled 2024-02-26: qty 2

## 2024-02-26 SURGICAL SUPPLY — 48 items
BAG COUNTER SPONGE SURGICOUNT (BAG) ×2 IMPLANT
BLADE SAW RECIP 87.9 MT (BLADE) ×2 IMPLANT
BLADE SURG 21 STRL SS (BLADE) ×2 IMPLANT
BNDG COHESIVE 6X5 TAN ST LF (GAUZE/BANDAGES/DRESSINGS) IMPLANT
BNDG ELASTIC 4X5.8 VLCR STR LF (GAUZE/BANDAGES/DRESSINGS) IMPLANT
BNDG GAUZE DERMACEA FLUFF 4 (GAUZE/BANDAGES/DRESSINGS) IMPLANT
CANISTER PREVENA 45 (CANNISTER) IMPLANT
CANISTER SUCTION 3000ML PPV (SUCTIONS) ×2 IMPLANT
CANISTER WOUND CARE 500ML ATS (WOUND CARE) ×2 IMPLANT
COVER SURGICAL LIGHT HANDLE (MISCELLANEOUS) ×2 IMPLANT
CUFF TRNQT CYL 34X4.125X (TOURNIQUET CUFF) ×2 IMPLANT
DRAPE DERMATAC (DRAPES) IMPLANT
DRAPE HALF SHEET 40X57 (DRAPES) IMPLANT
DRAPE INCISE IOBAN 66X45 STRL (DRAPES) ×2 IMPLANT
DRAPE SURG ORHT 6 SPLT 77X108 (DRAPES) IMPLANT
DRAPE U-SHAPE 47X51 STRL (DRAPES) ×2 IMPLANT
DRESSING PREVENA PLUS CUSTOM (GAUZE/BANDAGES/DRESSINGS) ×2 IMPLANT
DRSG VAC GRANUFOAM LG (GAUZE/BANDAGES/DRESSINGS) IMPLANT
DRSG VAC GRANUFOAM MED (GAUZE/BANDAGES/DRESSINGS) IMPLANT
DRSG VAC GRANUFOAM SM (GAUZE/BANDAGES/DRESSINGS) IMPLANT
DURAPREP 26ML APPLICATOR (WOUND CARE) ×2 IMPLANT
ELECTRODE REM PT RTRN 9FT ADLT (ELECTROSURGICAL) ×2 IMPLANT
GAUZE PAD ABD 8X10 STRL (GAUZE/BANDAGES/DRESSINGS) IMPLANT
GAUZE SPONGE 4X4 12PLY STRL (GAUZE/BANDAGES/DRESSINGS) IMPLANT
GAUZE XEROFORM 5X9 LF (GAUZE/BANDAGES/DRESSINGS) ×2 IMPLANT
GLOVE BIOGEL PI IND STRL 9 (GLOVE) ×2 IMPLANT
GLOVE SURG ORTHO 9.0 STRL STRW (GLOVE) ×2 IMPLANT
GOWN STRL REUS W/ TWL LRG LVL3 (GOWN DISPOSABLE) ×2 IMPLANT
GOWN STRL REUS W/ TWL XL LVL3 (GOWN DISPOSABLE) ×4 IMPLANT
GRAFT SKIN WND MICRO 38 (Tissue) IMPLANT
KIT BASIN OR (CUSTOM PROCEDURE TRAY) ×2 IMPLANT
KIT TURNOVER KIT B (KITS) ×2 IMPLANT
MANIFOLD NEPTUNE II (INSTRUMENTS) ×2 IMPLANT
NS IRRIG 1000ML POUR BTL (IV SOLUTION) ×2 IMPLANT
PACK ORTHO EXTREMITY (CUSTOM PROCEDURE TRAY) ×2 IMPLANT
PAD ARMBOARD POSITIONER FOAM (MISCELLANEOUS) ×2 IMPLANT
PREVENA RESTOR ARTHOFORM 33X30 (CANNISTER) IMPLANT
PREVENA RESTOR ARTHOFORM 46X30 (CANNISTER) ×2 IMPLANT
SPONGE T-LAP 18X18 ~~LOC~~+RFID (SPONGE) IMPLANT
STAPLER SKIN PROX 35W (STAPLE) IMPLANT
STOCKINETTE IMPERVIOUS LG (DRAPES) ×2 IMPLANT
SUT ETHILON 2 0 PSLX (SUTURE) IMPLANT
SUT SILK 2-0 18XBRD TIE 12 (SUTURE) ×2 IMPLANT
SUT VIC AB 1 CTX 27 (SUTURE) ×4 IMPLANT
TOWEL GREEN STERILE (TOWEL DISPOSABLE) ×2 IMPLANT
TOWEL GREEN STERILE FF (TOWEL DISPOSABLE) ×2 IMPLANT
TUBE CONNECTING 12X1/4 (SUCTIONS) ×2 IMPLANT
YANKAUER SUCT BULB TIP NO VENT (SUCTIONS) ×2 IMPLANT

## 2024-02-26 NOTE — Progress Notes (Addendum)
 53 year old male with history of prior chest wall abscess and sternoclavicular joint infection with MSSA and group A strep treated in 2020, bilateral feet osteomyelitis, right foot osteomyelitis status post TMA with polymicrobial cultures treated with 6 weeks of Augmentin  on discharge, diabetes mellitus admitted with DKA found to have   #Group B strep bacteremia secondary to left foot wound - Patient presented with fusion, left foot ulcer suspected to be infected.  Started on broad-spectrum antibiotics now on ceftriaxone  -Blood culture admission 1/1 group B strep, repeat cultures no growth so far - MRI left foot showed possible abscess along calcaneus, possible early osteomyelitis cuboid bone. - TTE without vegetation.  Given wound has been present for about 3 months and patient is bacteremic will need TEE   #Poorly controlled diabetes - DKA - A1c 10.3 - Needs glycemic control for optimal wound healing Recommendations: - Continue Unasyn  - OR with ortho today - EEG showed esophagitis/duodenitis, pt not candidate for TEE per cards - Follow repeat cultures to ensure clearance -Pending OR findings to day, plan on 6 weeks of amox 1gm tid EOT 8/24. Discussed PO abx plan with pt today an dhe is amenable -ID f/u on 8/19 - Standard precautions

## 2024-02-26 NOTE — Telephone Encounter (Signed)
 Called patient, no answer. Left voicemail confirming upcoming appointment on 03/01/2024 at 2:00 pm with Clinical Pharmacist. Provided callback number for any questions or changes.

## 2024-02-26 NOTE — Progress Notes (Signed)
 [facility]  May start DVT prophylax when vac has < 50 cc of drainage in a 24 hour period.   Maurilio Deland Collet PA-C  [phone]

## 2024-02-26 NOTE — Progress Notes (Signed)
 Receive pt from PACU in bed with wound vac attach to left leg at 125. Pt is alert and oriented, drinking water  and asking for Gatorade from his dresser. Call button and bedside in reach.

## 2024-02-26 NOTE — Plan of Care (Signed)
  Problem: Pain Managment: Goal: General experience of comfort will improve and/or be controlled Outcome: Progressing   Problem: Safety: Goal: Ability to remain free from injury will improve Outcome: Progressing

## 2024-02-26 NOTE — Anesthesia Preprocedure Evaluation (Addendum)
 Anesthesia Evaluation  Patient identified by MRN, date of birth, ID band Patient awake    Reviewed: Allergy & Precautions, NPO status , Patient's Chart, lab work & pertinent test results, reviewed documented beta blocker date and time   Airway Mallampati: II  TM Distance: >3 FB Neck ROM: Full    Dental no notable dental hx. (+) Teeth Intact, Dental Advisory Given   Pulmonary neg pulmonary ROS   Pulmonary exam normal breath sounds clear to auscultation       Cardiovascular hypertension, Pt. on home beta blockers and Pt. on medications Normal cardiovascular exam+ dysrhythmias Atrial Fibrillation  Rhythm:Regular Rate:Normal  TTE 2025 1. Left ventricular ejection fraction, by estimation, is 55 to 60%. The  left ventricle has normal function. The left ventricle has no regional  wall motion abnormalities. Left ventricular diastolic parameters were  normal.   2. Right ventricular systolic function is normal. The right ventricular  size is normal. There is mildly elevated pulmonary artery systolic  pressure. The estimated right ventricular systolic pressure is 39.8 mmHg.   3. The mitral valve is normal in structure. No evidence of mitral valve  regurgitation. No evidence of mitral stenosis.   4. The aortic valve is tricuspid. Aortic valve regurgitation is not  visualized. No aortic stenosis is present.   5. The inferior vena cava is dilated in size with <50% respiratory  variability, suggesting right atrial pressure of 15 mmHg.     Neuro/Psych  PSYCHIATRIC DISORDERS  Depression    negative neurological ROS     GI/Hepatic Neg liver ROS, PUD,GERD  ,,  Endo/Other  diabetes, Type 2, Insulin  Dependent  Class 3 obesity  Renal/GU negative Renal ROS  negative genitourinary   Musculoskeletal negative musculoskeletal ROS (+)    Abdominal   Peds  Hematology  (+) Blood dyscrasia, anemia Lab Results      Component                 Value               Date                      WBC                      11.1 (H)            02/25/2024                HGB                      8.9 (L)             02/25/2024                HCT                      27.6 (L)            02/25/2024                MCV                      84.1                02/25/2024                PLT  334                 02/25/2024              Anesthesia Other Findings   Reproductive/Obstetrics                              Anesthesia Physical Anesthesia Plan  ASA: 3  Anesthesia Plan: General and Regional   Post-op Pain Management: Regional block* and Tylenol  PO (pre-op)*   Induction: Intravenous  PONV Risk Score and Plan: 2 and Ondansetron  and Midazolam   Airway Management Planned: LMA  Additional Equipment:   Intra-op Plan:   Post-operative Plan: Extubation in OR  Informed Consent: I have reviewed the patients History and Physical, chart, labs and discussed the procedure including the risks, benefits and alternatives for the proposed anesthesia with the patient or authorized representative who has indicated his/her understanding and acceptance.     Dental advisory given  Plan Discussed with: CRNA  Anesthesia Plan Comments:          Anesthesia Quick Evaluation

## 2024-02-26 NOTE — Transfer of Care (Signed)
 Immediate Anesthesia Transfer of Care Note  Patient: Taylor Hughes  Procedure(s) Performed: AMPUTATION BELOW KNEE, LEFT (Left: Knee) APPLICATION, WOUND VAC (Left: Leg Lower)  Patient Location: PACU  Anesthesia Type:General  Level of Consciousness: awake, alert , and oriented  Airway & Oxygen  Therapy: Patient Spontanous Breathing and Patient connected to face mask oxygen   Post-op Assessment: Report given to RN and Post -op Vital signs reviewed and stable  Post vital signs: Reviewed and stable  Last Vitals:  Vitals Value Taken Time  BP 116/73 02/26/24 14:15  Temp    Pulse 87 02/26/24 14:15  Resp 17 02/26/24 14:15  SpO2 99 % 02/26/24 14:15  Vitals shown include unfiled device data.  Last Pain:  Vitals:   02/26/24 1104  TempSrc:   PainSc: 0-No pain         Complications: No notable events documented.

## 2024-02-26 NOTE — Progress Notes (Signed)
 Orthopedic Tech Progress Note Patient Details:  Taylor Hughes 10/17/1970 [id number]  Patient ID: Taylor Hughes, male   DOB: 10/05/1970, 53 y.o.   MRN: [id number] Glendia gave pt. Stump protector [name] 02/26/2024, 2:36 PM

## 2024-02-26 NOTE — Interval H&P Note (Signed)
 History and Physical Interval Note:  02/26/2024 6:45 AM  Taylor Hughes  has presented today for surgery, with the diagnosis of abscess and osteomyelitis left foot.  The various methods of treatment have been discussed with the patient and family. After consideration of risks, benefits and other options for treatment, the patient has consented to  Procedure(s): AMPUTATION BELOW KNEE (Left) APPLICATION, WOUND VAC (Left) as a surgical intervention.  The patient's history has been reviewed, patient examined, no change in status, stable for surgery.  I have reviewed the patient's chart and labs.  Questions were answered to the patient's satisfaction.     Trinity Haun V Mechel Haggard

## 2024-02-26 NOTE — Progress Notes (Signed)
 PROGRESS NOTE  Taylor Hughes  FMW:[id number] DOB: 1971/08/09 DOA: 02/21/2024 PCP: Delbert Clam, MD   Brief Narrative: Patient is a 53 year old male with history of paroxysmal atrial fibrillation, type 2 diabetes, DKA, gastroparesis, obesity who presented with altered mentation, hyperglycemia on 7/13.[name]  He was recently admitted for DKA in 4/25 and was treated for left foot infection as well.  Workup in the ED was consistent with DKA with glucose of more than 1000, potassium 6.9, bicarb of less than 7, creatinine of 3.2, pH of 6.8.  He was confused on presentation.  Left foot had a ulcer on the heel that was suspected  to be infected.  Started on empiric  antibiotics, IV insulin .  Patient was admitted to [facility] service.   Patient transferred to [facility] service on 7/16. Mentation has improved.  ID and orthopedics consulted for possible osteomyelitis of the left foot, plan for left BKA bilateral today.   EGD- 02/25/24 - GI  to evaluate for anemia, black tarry stool. ---------------------------------------------------------------------------------------------------------------------------------  Subjective:  The patient was seen and examined today, hemodynamically stable, no acute distress  N.p.o. - for  left BKA today Still complaining of discomfort left heel-analgesics helping Understand that he will need surgical intervention  Addendum: Patient was seen and examined postop, somnolent, hemodynamically stable   Assessment & Plan:  Principal Problem:   DKA (diabetic ketoacidosis) (HCC) Active Problems:   Acute on chronic anemia   Encephalopathy acute   Coffee ground emesis   Iron deficiency anemia   Cutaneous abscess of left foot   Erosive esophagitis   Duodenitis   Sepsis secondary to left foot infection/B/L LE lymphedema:  - POA leukocytosis,  - He has history of left foot infection in the past as well.   - MRI was incomplete but showed  chronic soft tissue ulceration along the plantar  aspect of the midfoot which appears progressive compared with previous MRI of 3 months ago,new heterogeneous fluid collection lateral to the calcaneus partially encasing  the distal peroneus brevis tendon, suspicious for an abscess or infected tenosynovitis.  Also showed bone marrow edema, could not rule out osteomyelitis.    Postop day #0 - Consulted orthopedics, Dr Harden - Left BKA today 02/26/2024  - Patient was seen and examined.,  Hemodynamically stable tolerated procedure  Continue broad-spectrum antibiotics -  ID following History of right transmetatarsal amputation    DKA:-Resolved -A1c of 10.9 , consulted diabetic coordinator and following. - Currently checking CBG q. ACH S, while NPO every 4 hours, will SSI coverage   Bacteremia:  Blood culture 7/13 -grew group B strep (resistant to clindamycin , erythromycin) Repeat blood culture 714-no growth to date Consulted  ID.  IV antibiotics-per ID, currently on Ancef  2D echo showed EF of 35 to 60%, no wall motion abnormality, no evidence of valvular vegetation.Plan for TEE   Anemia of chronic disease/iron deficiency: w GI bleed,  02/25/2024, s/p EGD: Revealing LA grade C esophagitis, duodenitis -Recommend to continue pantoprazole  40 mg p.o. twice daily on discharge -will continue IV Protonix  for now Follow biopsy H. pylori serology -GI recommending follow-up and EGD in 8-12 weeks GI signed off Monitor hemoglobin 8.7 >>> 8.9  Severe iron deficiency with a level of 7.  On oral iron supplementation.    AKI/AGMA: Secondary to dehydration, DKA.  Currently resolved. Foley discontinued.   Acute metabolic encephalopathy:  - Improved mentation back to baseline CT head negative for acute findings.  Improving mental status with correction of metabolic derangements, AKI, DKA.  Limit sedating  medications.     GERD/history of gastroparesis:  Gastric emptying scan on 1/24 showed delayed gastric emptying.  Currently on Protonix , Reglan ,  Zofran .    History of paroxysmal A-fib:  Currently in normal sinus rhythm.   On metoprolol  at home.   He is not on anticoagulation at home.  Follows with PCP.  CHA2DS2-VASc score of 2  Hypertension: Monitor blood pressure.  Valsartan  on hold due to AKI  Hyperlipidemia: On statin   Obesity/deconditioning: BMI of 39.  PT consulted      Nutrition Problem: Inadequate oral intake Etiology: chronic illness (gastroparesis and uncontrolled diabetes)    DVT prophylaxis:heparin  injection 5,000 Units Start: 02/23/24 1400 SCDs Start: 02/21/24 2325     Code Status: Full Code  Family Communication: No family member present at bedside Patient status:Inpatient  Patient is from :Home  Anticipated discharge un:[facility] vs SNF in 2-3 days   Estimated DC date: TBD    Consultants: PCCM, GI, ID, orthopedics  Procedures: None yet  Antimicrobials:  Anti-infectives (From admission, onward)    Start     Dose/Rate Route Frequency Ordered Stop   02/26/24 1100  ceFAZolin  (ANCEF ) IVPB 3g/150 mL premix        3 g 300 mL/hr over 30 Minutes Intravenous On call to O.R. 02/26/24 1012 02/27/24 0559   02/26/24 1100  vancomycin  (VANCOREADY) IVPB 1500 mg/300 mL        1,500 mg 150 mL/hr over 120 Minutes Intravenous On call to O.R. 02/26/24 1012 02/27/24 0559   02/26/24 1014  ceFAZolin  (ANCEF ) 3-4 GM/150ML-% IVPB       Note to Pharmacy: SEBASTIAN PILSNER N: cabinet override      02/26/24 1014 02/26/24 2229   02/24/24 1200  [MAR Hold]  Ampicillin -Sulbactam (UNASYN ) 3 g in sodium chloride  0.9 % 100 mL IVPB        (MAR Hold since Fri 02/26/2024 at 1035.Hold Reason: Transfer to a Procedural area)   3 g 200 mL/hr over 30 Minutes Intravenous Every 6 hours 02/24/24 1036     02/23/24 1000  vancomycin  (VANCOREADY) IVPB 1500 mg/300 mL  Status:  Discontinued        1,500 mg 150 mL/hr over 120 Minutes Intravenous Every 24 hours 02/22/24 0018 02/22/24 0830   02/22/24 1000  ceFEPIme  (MAXIPIME ) 2 g in sodium chloride   0.9 % 100 mL IVPB  Status:  Discontinued        2 g 200 mL/hr over 30 Minutes Intravenous Every 12 hours 02/22/24 0018 02/22/24 0830   02/22/24 1000  linezolid  (ZYVOX ) IVPB 600 mg  Status:  Discontinued        600 mg 300 mL/hr over 60 Minutes Intravenous Every 12 hours 02/22/24 0830 02/22/24 1111   02/22/24 0930  cefTRIAXone  (ROCEPHIN ) 2 g in sodium chloride  0.9 % 100 mL IVPB  Status:  Discontinued        2 g 200 mL/hr over 30 Minutes Intravenous Every 24 hours 02/22/24 0830 02/24/24 1036   02/21/24 2115  ceFEPIme  (MAXIPIME ) 2 g in sodium chloride  0.9 % 100 mL IVPB        2 g 200 mL/hr over 30 Minutes Intravenous  Once 02/21/24 2103 02/21/24 2213   02/21/24 2115  metroNIDAZOLE  (FLAGYL ) IVPB 500 mg        500 mg 100 mL/hr over 60 Minutes Intravenous  Once 02/21/24 2103 02/21/24 2257   02/21/24 2115  vancomycin  (VANCOCIN ) IVPB 1000 mg/200 mL premix  Status:  Discontinued  1,000 mg 200 mL/hr over 60 Minutes Intravenous  Once 02/21/24 2103 02/21/24 2106   02/21/24 2115  vancomycin  (VANCOREADY) IVPB 2000 mg/400 mL        2,000 mg 200 mL/hr over 120 Minutes Intravenous  Once 02/21/24 2106 02/22/24 0051        Objective: Vitals:   02/26/24 0617 02/26/24 0953 02/26/24 0953 02/26/24 1040  BP: (!) 144/95 138/78 138/78 (!) 145/87  Pulse: (!) 127 86 86 87  Resp: 20 17 17 18   Temp: 98 F (36.7 C) 98.8 F (37.1 C) 98.8 F (37.1 C) 98.1 F (36.7 C)  TempSrc: Oral Oral Oral Oral  SpO2: 99% 100% 100% 99%  Weight: 130.4 kg   130.4 kg  Height:    6' (1.829 m)    Intake/Output Summary (Last 24 hours) at 02/26/2024 1109 Last data filed at 02/25/2024 1756 Gross per 24 hour  Intake 360 ml  Output 900 ml  Net -540 ml   Filed Weights   02/25/24 0500 02/26/24 0617 02/26/24 1040  Weight: 135.2 kg 130.4 kg 130.4 kg    Examination:  General:  Somnolent,  cooperative, no distress;   HEENT:  Normocephalic, PERRL, otherwise with in Normal limits   Neuro:  CNII-XII intact. , normal motor  and sensation, reflexes intact   Lungs:   Clear to auscultation BL, Respirations unlabored,  No wheezes / crackles  Cardio:    S1/S2, RRR, No murmure, No Rubs or Gallops   Abdomen:  Soft, non-tender, bowel sounds active all four quadrants, no guarding or peritoneal signs.  Muscular  skeletal:  Limited exam -global generalized weaknesses Left BKA, dressing - in bed, able to move extremities,    Skin:  Dry, warm to touch, negative for any Rashes,  Wounds: Please see nursing documentation          Data Reviewed: I have personally reviewed following labs and imaging studies  CBC: Recent Labs  Lab 02/21/24 2130 02/21/24 2134 02/22/24 0326 02/22/24 1030 02/22/24 1814 02/23/24 0241 02/24/24 0555 02/25/24 0625  WBC 25.9*  --  27.7* 26.0*  --  23.7* 16.4* 11.1*  NEUTROABS 24.5*  --   --   --   --   --   --   --   HGB 9.6*   < > 8.6* 8.6* 9.7* 8.7* 8.6* 8.9*  HCT 36.4*   < > 31.2* 28.3* 30.5* 26.9* 26.0* 27.6*  MCV 103.7*  --  100.3* 90.1  --  83.8 81.8 84.1  PLT 487*  --  383 396  --  375 333 334   < > = values in this interval not displayed.   Basic Metabolic Panel: Recent Labs  Lab 02/22/24 1030 02/22/24 1421 02/23/24 0241 02/23/24 0731 02/24/24 0555 02/24/24 1755 02/25/24 0625 02/26/24 0757  NA 138   < > 142 142 146*  --  144 141  K 4.1   < > 3.8 4.2 3.7  --  3.4* 4.2  CL 103   < > 112* 111 112*  --  111 112*  CO2 12*   < > 18* 18* 21*  --  23 15*  GLUCOSE 769*   < > 169* 144* 360*  --  213* 281*  BUN 49*   < > 46* 43* 33*  --  21* 17  CREATININE 3.14*   < > 1.97* 1.70* 1.20  --  1.04 0.91  CALCIUM  9.0   < > 9.2 9.2 9.3  --  9.0 8.5*  MG 2.6*  --   --  2.3  --   --   --   --   PHOS 1.6*  --  <1.0*  --   --  1.2* 1.5* 2.1*   < > = values in this interval not displayed.        Radiology Studies: No results found.   Scheduled Meds:  [MAR Hold] atorvastatin   20 mg Oral QHS   ceFAZolin         ceFAZolin  (ANCEF ) IV  3 g Intravenous On Call to OR    chlorhexidine   60 mL Topical Once   chlorhexidine   15 mL Mouth/Throat Once   Or   mouth rinse  15 mL Mouth Rinse Once   chlorhexidine        [MAR Hold] Chlorhexidine  Gluconate Cloth  6 each Topical Q0600   [MAR Hold] ferrous sulfate   325 mg Oral Q breakfast   [MAR Hold] heparin  injection (subcutaneous)  5,000 Units Subcutaneous Q8H   [MAR Hold] insulin  aspart  0-5 Units Subcutaneous QHS   [MAR Hold] insulin  aspart  0-9 Units Subcutaneous TID WC   [MAR Hold] insulin  glargine-yfgn  20 Units Subcutaneous BID   [MAR Hold] metoprolol  tartrate  50 mg Oral BID   [MAR Hold] multivitamin with minerals  1 tablet Oral Daily   [MAR Hold] mupirocin  ointment  1 Application Nasal BID   [MAR Hold] nutrition supplement (JUVEN)  1 packet Oral BID BM   [MAR Hold] pantoprazole   40 mg Oral Daily   [MAR Hold] phosphorus  500 mg Oral TID   povidone-iodine   2 Application Topical Once   Continuous Infusions:  [MAR Hold] ampicillin -sulbactam (UNASYN ) IV 3 g (02/26/24 0605)   lactated ringers      vancomycin  1,500 mg (02/26/24 1108)     LOS: 5 days   Adriana DELENA Grams, MD  Time spent 55 minutes.  [facility] P7/18/2025, 11:09 AM

## 2024-02-26 NOTE — Op Note (Signed)
 02/26/2024  1:55 PM  PATIENT:  Taylor Hughes    PRE-OPERATIVE DIAGNOSIS:  abscess and osteomyelitis left foot  POST-OPERATIVE DIAGNOSIS:  Same  PROCEDURE:  AMPUTATION BELOW KNEE, LEFT, APPLICATION, WOUND VAC Application of Kerecis micro graft 38 cm. Application of Prevena customizable and Prevena arthroform wound VAC dressings Application of Vive Wear stump shrinker   SURGEON:  Jerona LULLA Sage, MD  ANESTHESIA:   General  PREOPERATIVE INDICATIONS:  Khamarion Bjelland is a  53 y.o. male with a diagnosis of abscess and osteomyelitis left foot who failed conservative measures and elected for surgical management.    The risks benefits and alternatives were discussed with the patient preoperatively including but not limited to the risks of infection, bleeding, nerve injury, cardiopulmonary complications, the need for revision surgery, among others, and the patient was willing to proceed.  OPERATIVE IMPLANTS:   Implant Name Type Inv. Item Serial No. Manufacturer Lot No. LRB No. Used Action  GRAFT SKIN WND MICRO 38 - [id number] Tissue GRAFT SKIN WND MICRO 38  [employer] [id number] Left 1 Implanted     OPERATIVE FINDINGS: muscle had poor color and poor contractility  OPERATIVE PROCEDURE: Patient was brought to the operating room after undergoing a regional anesthetic.  After adequate levels anesthesia were obtained a thigh tourniquet was placed and the lower extremity was prepped using DuraPrep draped into a sterile field. The foot was draped out of the sterile field with impervious stockinette.  A timeout was called and the tourniquet inflated.  A transverse skin incision was made 12 cm distal to the tibial tubercle, the incision curved proximally, and a large posterior flap was created.  The tibia was transected just proximal to the skin incision and beveled anteriorly.  The fibula was transected just proximal to the tibial incision.  The sciatic nerve was pulled cut and allowed to retract.  The  vascular bundles were suture ligated with 2-0 silk.  The tourniquet was deflated and hemostasis obtained.  The wound was irrigated with Vashe.   The Kerecis micro powder 38 cm was applied to the open wound that has a 200 cm surface area.    The deep and superficial fascial layers were closed using #1 Vicryl.  The skin was closed using staples.    The Prevena customizable dressing was applied this was overwrapped with the arthroform sponge.  Parthenia was used to secure the sponges and the circumferential compression was secured to the skin with Dermatac.  This was connected to the wound VAC pump and had a good suction fit this was covered with a stump shrinker and a limb protector.  Patient was taken to the PACU in stable condition.   DISCHARGE PLANNING:  Antibiotic duration: 24-hour antibiotics  Weightbearing: Nonweightbearing on the operative extremity  Pain medication: Opioid pathway  Dressing care/ Wound VAC: Continue wound VAC with the Prevena plus pump at discharge for 1 week  Ambulatory devices: Walker or kneeling scooter  Discharge to: Discharge planning based on recommendations per physical therapy  Follow-up: In the office 1 week after discharge.

## 2024-02-26 NOTE — Anesthesia Procedure Notes (Signed)
 Procedure Name: LMA Insertion Date/Time: 02/26/2024 1:11 PM  Performed by: Elby Raelene SAUNDERS, CRNAPre-anesthesia Checklist: Patient identified, Emergency Drugs available, Suction available and Patient being monitored Patient Re-evaluated:Patient Re-evaluated prior to induction Oxygen  Delivery Method: Circle System Utilized Preoxygenation: Pre-oxygenation with 100% oxygen  Induction Type: IV induction Ventilation: Mask ventilation without difficulty LMA: LMA inserted LMA Size: 5.0 Number of attempts: 1 Airway Equipment and Method: Bite block Placement Confirmation: positive ETCO2 Tube secured with: Tape Dental Injury: Teeth and Oropharynx as per pre-operative assessment

## 2024-02-27 DIAGNOSIS — M86272 Subacute osteomyelitis, left ankle and foot: Secondary | ICD-10-CM | POA: Diagnosis not present

## 2024-02-27 LAB — GLUCOSE, CAPILLARY
Glucose-Capillary: 423 mg/dL — ABNORMAL HIGH (ref 70–99)
Glucose-Capillary: 389 mg/dL — ABNORMAL HIGH (ref 70–99)
Glucose-Capillary: 329 mg/dL — ABNORMAL HIGH (ref 70–99)
Glucose-Capillary: 148 mg/dL — ABNORMAL HIGH (ref 70–99)
Glucose-Capillary: 146 mg/dL — ABNORMAL HIGH (ref 70–99)

## 2024-02-27 LAB — BASIC METABOLIC PANEL WITH GFR
Anion gap: 9 (ref 5–15)
BUN: 21 mg/dL — ABNORMAL HIGH (ref 6–20)
CO2: 21 mmol/L — ABNORMAL LOW (ref 22–32)
Calcium: 8.2 mg/dL — ABNORMAL LOW (ref 8.9–10.3)
Chloride: 107 mmol/L (ref 98–111)
Creatinine, Ser: 1.07 mg/dL (ref 0.61–1.24)
GFR, Estimated: >60 mL/min (ref >60)
Glucose, Bld: 369 mg/dL — ABNORMAL HIGH (ref 70–99)
Potassium: 4.6 mmol/L (ref 3.5–5.1)
Sodium: 137 mmol/L (ref 135–145)

## 2024-02-27 LAB — CBC
HCT: 30.8 % — ABNORMAL LOW (ref 39.0–52.0)
Hemoglobin: 9.6 g/dL — ABNORMAL LOW (ref 13.0–17.0)
MCH: 26.7 pg (ref 26.0–34.0)
MCHC: 31.2 g/dL (ref 30.0–36.0)
MCV: 85.8 fL (ref 80.0–100.0)
Platelets: 344 K/uL (ref 150–400)
RBC: 3.59 MIL/uL — ABNORMAL LOW (ref 4.22–5.81)
RDW: 17.2 % — ABNORMAL HIGH (ref 11.5–15.5)
WBC: 10.4 K/uL (ref 4.0–10.5)
nRBC: 0 % (ref 0.0–0.2)

## 2024-02-27 LAB — CULTURE, BLOOD (ROUTINE X 2)
Culture: NO GROWTH
Culture: NO GROWTH
Special Requests: ADEQUATE
Special Requests: ADEQUATE

## 2024-02-27 MED ORDER — GABAPENTIN 300 MG PO CAPS
300.0000 mg | ORAL_CAPSULE | Freq: Two times a day (BID) | ORAL | Status: DC
  Administered 2024-02-27 – 2024-02-28 (×3): 300 mg via ORAL
  Filled 2024-02-27 (×3): qty 1

## 2024-02-27 MED ORDER — PHENOL 1.4 % MT LIQD
1.0000 | OROMUCOSAL | Status: DC | PRN
  Administered 2024-02-27: 1 via OROMUCOSAL
  Filled 2024-02-27: qty 177

## 2024-02-27 MED ORDER — ACETAMINOPHEN 500 MG PO TABS
1000.0000 mg | ORAL_TABLET | Freq: Three times a day (TID) | ORAL | Status: DC
  Administered 2024-02-27 – 2024-03-04 (×18): 1000 mg via ORAL
  Filled 2024-02-27 (×19): qty 2

## 2024-02-27 MED ORDER — OXYCODONE HCL 5 MG PO TABS
15.0000 mg | ORAL_TABLET | ORAL | Status: DC | PRN
  Administered 2024-02-27 – 2024-03-04 (×9): 15 mg via ORAL
  Filled 2024-02-27 (×10): qty 3

## 2024-02-27 MED ORDER — HYDROXYZINE HCL 10 MG PO TABS
10.0000 mg | ORAL_TABLET | Freq: Three times a day (TID) | ORAL | Status: DC | PRN
  Administered 2024-02-27: 10 mg via ORAL
  Filled 2024-02-27: qty 1

## 2024-02-27 MED ORDER — INSULIN ASPART 100 UNIT/ML IJ SOLN
4.0000 [IU] | Freq: Three times a day (TID) | INTRAMUSCULAR | Status: DC
  Administered 2024-02-27 – 2024-03-02 (×12): 4 [IU] via SUBCUTANEOUS

## 2024-02-27 MED ORDER — FREESTYLE LIBRE 3 SENSOR MISC: 1.0000 | Status: DC

## 2024-02-27 MED ORDER — INSULIN ASPART 100 UNIT/ML IJ SOLN
0.0000 [IU] | Freq: Three times a day (TID) | INTRAMUSCULAR | Status: DC
  Administered 2024-02-27: 15 [IU] via SUBCUTANEOUS
  Administered 2024-02-27: 2 [IU] via SUBCUTANEOUS
  Administered 2024-02-27: 11 [IU] via SUBCUTANEOUS
  Administered 2024-02-28: 5 [IU] via SUBCUTANEOUS
  Administered 2024-02-28: 3 [IU] via SUBCUTANEOUS
  Administered 2024-02-28: 2 [IU] via SUBCUTANEOUS
  Administered 2024-02-29: 8 [IU] via SUBCUTANEOUS
  Administered 2024-02-29: 5 [IU] via SUBCUTANEOUS
  Administered 2024-02-29: 2 [IU] via SUBCUTANEOUS
  Administered 2024-03-01: 3 [IU] via SUBCUTANEOUS
  Administered 2024-03-01: 5 [IU] via SUBCUTANEOUS
  Administered 2024-03-01: 8 [IU] via SUBCUTANEOUS
  Administered 2024-03-02 (×2): 5 [IU] via SUBCUTANEOUS
  Administered 2024-03-02: 11 [IU] via SUBCUTANEOUS
  Administered 2024-03-03: 8 [IU] via SUBCUTANEOUS
  Administered 2024-03-03: 5 [IU] via SUBCUTANEOUS
  Administered 2024-03-04: 2 [IU] via SUBCUTANEOUS
  Administered 2024-03-04: 8 [IU] via SUBCUTANEOUS
  Administered 2024-03-04: 3 [IU] via SUBCUTANEOUS

## 2024-02-27 MED ORDER — INSULIN ASPART 100 UNIT/ML IJ SOLN
0.0000 [IU] | Freq: Every day | INTRAMUSCULAR | Status: DC
  Administered 2024-02-28: 3 [IU] via SUBCUTANEOUS
  Administered 2024-03-01 – 2024-03-02 (×2): 2 [IU] via SUBCUTANEOUS

## 2024-02-27 MED ORDER — OXYCODONE HCL 5 MG PO TABS
10.0000 mg | ORAL_TABLET | ORAL | Status: DC | PRN
  Administered 2024-02-28 – 2024-03-03 (×4): 10 mg via ORAL
  Filled 2024-02-27 (×5): qty 2

## 2024-02-27 MED ORDER — INSULIN GLARGINE-YFGN 100 UNIT/ML ~~LOC~~ SOLN
20.0000 [IU] | Freq: Every day | SUBCUTANEOUS | Status: DC
  Administered 2024-02-27 – 2024-02-28 (×2): 20 [IU] via SUBCUTANEOUS
  Filled 2024-02-27 (×3): qty 0.2

## 2024-02-27 NOTE — Progress Notes (Addendum)
 Called ortho tech in regards to  [employer], was told they are aware and would notify RN before arriving to unit.

## 2024-02-27 NOTE — Evaluation (Signed)
 Occupational Therapy Evaluation Patient Details Name: Taylor Hughes MRN: [id number] DOB: [birth year] Today's Date: 02/27/2024   History of Present Illness   Taylor Hughes is a 53 y.o. male admitted 02/21/24 with DKA, Lt foot wound, AKI. 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 PMHx: Rt transmet amputation, T2DM, peripheral neuropathy, Afib, HTN, GERD.     Clinical Impressions Pt presents with problem above with deficits listed below. Upon eval, pt presents with decr knowledge of therapeutic course to optimize independence after BKA, decreased safety, balance, strength, activity tolerance. Upon eval, pt hesitant to move but willing to give himself a sponge bath in bed/EOB. Pt needing max A for posterior pericare and Les. Pt deferred functional transfers today. Provided education regarding importance, limb desentization, use of limb protector, and positioning. Patient will benefit from continued inpatient follow up therapy, <3 hours/day      If plan is discharge home, recommend the following:   Two people to help with walking and/or transfers;A lot of help with bathing/dressing/bathroom;Assistance with cooking/housework;Help with stairs or ramp for entrance;Assist for transportation     Functional Status Assessment   Patient has had a recent decline in their functional status and demonstrates the ability to make significant improvements in function in a reasonable and predictable amount of time.     Equipment Recommendations   Other (comment) (defer)     Recommendations for Other Services         Precautions/Restrictions   Precautions Precautions: Fall Recall of Precautions/Restrictions: Impaired Precaution/Restrictions Comments: Wound Vac on LLE Required Braces or Orthoses: Other Brace Other Brace: AmpuShield Restrictions Weight Bearing Restrictions Per Provider Order: No     Mobility Bed  Mobility Overal bed mobility: Needs Assistance Bed Mobility: Supine to Sit, Sit to Supine, Rolling Rolling: Supervision   Supine to sit: HOB elevated, Used rails, Contact guard Sit to supine: HOB elevated, Used rails, Contact guard assist   General bed mobility comments: Pt sat up on L side of bed with increased time. He slowly brought BLE off EOB and used bed rail to elevate trunk and scoot fwd. Returned to bed slowly lifting BLE in and recentering himself.    Transfers Overall transfer level: Needs assistance                 General transfer comment: pt deferred OOB      Balance Overall balance assessment: Needs assistance Sitting-balance support: No upper extremity supported, Feet supported Sitting balance-Leahy Scale: Fair Sitting balance - Comments: Pt sat EOB with supervision.                                   ADL either performed or assessed with clinical judgement   ADL Overall ADL's : Needs assistance/impaired Eating/Feeding: Modified independent;Bed level   Grooming: Set up;Bed level   Upper Body Bathing: Set up;Sitting   Lower Body Bathing: Maximal assistance;Sitting/lateral leans;Bed level   Upper Body Dressing : Set up;Sitting   Lower Body Dressing: Maximal assistance                 General ADL Comments: pt deferred OOB     Vision   Vision Assessment?: No apparent visual deficits     Perception         Praxis         Pertinent Vitals/Pain Pain Assessment Pain Assessment: Faces Faces Pain Scale: Hurts  even more Pain Location: LLE Pain Descriptors / Indicators: Grimacing, Guarding, Discomfort, Tender Pain Intervention(s): Limited activity within patient's tolerance, Monitored during session     Extremity/Trunk Assessment Upper Extremity Assessment Upper Extremity Assessment: Overall WFL for tasks assessed   Lower Extremity Assessment Lower Extremity Assessment: Defer to PT evaluation   Cervical / Trunk  Assessment Cervical / Trunk Assessment: Other exceptions Cervical / Trunk Exceptions: Increased Body Habitus   Communication Communication Communication: No apparent difficulties   Cognition Arousal: Alert Behavior During Therapy: WFL for tasks assessed/performed, Anxious Cognition: No apparent impairments                               Following commands: Intact       Cueing  General Comments   Cueing Techniques: Verbal cues;Gestural cues      Exercises Other Exercises Other Exercises: educated regarding desensitization, positioning at rest, course leading to prosthesis   Shoulder Instructions      Home Living Family/patient expects to be discharged to:: Skilled nursing facility Living Arrangements: Spouse/significant other Available Help at Discharge: Family;Available PRN/intermittently Type of Home: Apartment Home Access: Level entry     Home Layout: One level     Bathroom Shower/Tub: Chief Strategy Officer: Standard     Home Equipment: Agricultural consultant (2 wheels);Cane - single point;Wheelchair - manual          Prior Functioning/Environment Prior Level of Function : Independent/Modified Independent;Driving             Mobility Comments: Ambulates with SPC. Denies fall history. ADLs Comments: Indep with ADLs/IADLs. On disability    OT Problem List: Decreased strength;Decreased activity tolerance;Impaired balance (sitting and/or standing);Decreased safety awareness;Decreased knowledge of use of DME or AE   OT Treatment/Interventions: Self-care/ADL training;Therapeutic exercise;DME and/or AE instruction;Therapeutic activities;Patient/family education;Balance training;Cognitive remediation/compensation      OT Goals(Current goals can be found in the care plan section)   Acute Rehab OT Goals Patient Stated Goal: get better OT Goal Formulation: With patient Time For Goal Achievement: 03/12/24 Potential to Achieve Goals: Good    OT Frequency:  Min 2X/week    Co-evaluation              AM-PAC OT 6 Clicks Daily Activity     Outcome Measure Help from another person eating meals?: None Help from another person taking care of personal grooming?: A Little Help from another person toileting, which includes using toliet, bedpan, or urinal?: A Lot Help from another person bathing (including washing, rinsing, drying)?: A Lot Help from another person to put on and taking off regular upper body clothing?: A Little Help from another person to put on and taking off regular lower body clothing?: A Lot 6 Click Score: 16   End of Session Nurse Communication: Mobility status  Activity Tolerance: Patient tolerated treatment well Patient left: in bed;with call bell/phone within reach;with bed alarm set  OT Visit Diagnosis: Unsteadiness on feet (R26.81);Muscle weakness (generalized) (M62.81);Pain                Time: 1500-1534 OT Time Calculation (min): 34 min Charges:  OT General Charges $OT Visit: 1 Visit OT Evaluation $OT Eval Low Complexity: 1 Low OT Treatments $Self Care/Home Management : 8-22 mins  Elma JONETTA Lebron FREDERICK, OTR/L [facility] Acute Rehabilitation Office: [phone]   Elma JONETTA Lebron 02/27/2024, 6:45 PM

## 2024-02-27 NOTE — Plan of Care (Signed)
  Problem: Pain Managment: Goal: General experience of comfort will improve and/or be controlled Outcome: Progressing   Problem: Safety: Goal: Ability to remain free from injury will improve Outcome: Progressing

## 2024-02-27 NOTE — Progress Notes (Signed)
 Patient ID: Taylor Hughes, male   DOB: 05/02/1971, 53 y.o.   MRN: [id number] Patient is postoperative day 1 left below-knee amputation.  There is no drainage of the wound VAC canister there is a good suction fit.  I will place orders for Unna boot to be changed twice a week to the right lower extremity.  Anticipate discharge to skilled nursing.  Patient may be a candidate for inpatient rehab pending physical therapy recommendations.

## 2024-02-27 NOTE — Inpatient Diabetes Management (Addendum)
 Inpatient Diabetes Program Recommendations  AACE/ADA: New Consensus Statement on Inpatient Glycemic Control (2015)  Target Ranges:  Prepandial:   less than 140 mg/dL      Peak postprandial:   less than 180 mg/dL (1-2 hours)      Critically ill patients:  140 - 180 mg/dL   Lab Results  Component Value Date   GLUCAP 423 (H) 02/27/2024   HGBA1C 10.9 (H) 02/22/2024    Review of Glycemic Control  Latest Reference Range & Units 02/26/24 17:16 02/26/24 20:51 02/27/24 08:37  Glucose-Capillary 70 - 99 mg/dL 709 (H) 605 (H) 576 (H)   Diabetes history: DM 2 Outpatient Diabetes medications:  FSL3 Current orders for Inpatient glycemic control:  Novolog  0-15 units tid with meals and HS Semglee  20 units q HS Novolog  4 units tid with meals  Inpatient Diabetes Program Recommendations:    Note blood sugars increased. He did receive steroids in surgery yesterday which likely increased blood sugars. Note referral for CGM.  It appears this was prescribed on 12/03/23 at PCP appointment.  Will follow up with patient on 02/29/24 regarding CGM and make sure he know how to use.  Consider increasing Semglee  to 20 units bid.    Thanks,  Randall Bullocks, RN, BC-ADM Inpatient Diabetes Coordinator Pager [phone]  (8a-5p)

## 2024-02-27 NOTE — Plan of Care (Signed)

## 2024-02-27 NOTE — Progress Notes (Signed)
 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 treated with 6 weeks of antibiotics/Augmentin   Last hospitalized 11/2023 for DKA, left foot infection 7/13 present [facility] ED lethargy metabolic encephalopathy secondary to likely DKA glucose >1000 K6.9 bicarb less than 7 pH 6.8   resuscitated aggressively in the ICU-gastroenterology evaluated secondary to emesis while in ICU hemoglobin remained stable in the 8 range although previously was 10 7/15 echocardiogram 55-60% no evidence of valvular vegetations--- ID discussed with cardiology and was not a candidate for TEE per cardiologist 7/16 transferred to hospitalist service--- ID consulted and recommended de-escalation to 6 weeks of amoxicillin  through 8/24  7/17 duodenitis on EGD small amount of residue in stomach grade C erosive esophagitis which is likely source of coffee-ground 7/18 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   Plan  Metabolic encephalopathy/severe sepsis on admission  Severe DKA on admission DKA-resolved  Sepsis on admission secondary to left foot infection with group B strep bacteremia (2/2 bottles 7/13) Antibiotics currently Unasyn -discharged on Amoxil  through 8/24-sepsis physiology improved  Left BKA 7/18-infected lower wound with group B strep bacteremia Await therapy services-WBAT and stump shrinker in addition to wound VAC as per Ortho Continue Dilaudid  for severe pain 0.5-1 every 4 as needed-scheduled Tylenol  1000 every 8 Can use Oxy IR 10 mg every 4 moderate and 15 mg Q for severe  Uncontrolled diabetes  mellitus with frequent DKA [Home doses 27 Lantus  daily]-A1c 10.9 Meds adjusted currently 4 units 3 times daily meals, sliding scale moderate coverage now with at bedtime and giving 20 units of Semglee  insulin  at night Need to ensure compliance Add back gabapentin  300 twice daily--- might increase dose if still continues to have pain and if not somnolent Asked diabetic coordinator regarding freestyle libre  Grade C erosive gastritis on EGD as above Underlying gastroparesis in 2024 H. pylori was negative-pathology showed reactive gastropathy and metaplasia consistent with peptic duodenitis Continuing Protonix  40 twice daily indefinitely until outpatient follow-up and EGD probably in 8 to 12 weeks Will discharge on oral iron given iron saturation of 7  Paroxysmal A-fib-no arrhythmia seems to be in sinus and no need for rate control Echo this admit EF 55-60%  AKI on admission from DKA physiology-resolved--valsartan  to be resumed at lower dose at discharge     Data Reviewed:  Sodium 137 potassium 4.6 BUN/creatinine 21/1.0 WBC 10.4 hemoglobin 9.6 platelet 344 CBGs ranging to 290-423   DVT prophylaxis: SCD right side  Status is: Inpatient Remains inpatient appropriate because:   Requires therapy services      Subjective: Concerned about his high blood sugar states he is having a little bit of sore throat and mild hiccups Only ate a little bit of the egg and sausage had toast I reassured him some    Objective + exam Vitals:   02/26/24 2048 02/27/24 0032 02/27/24 0500 02/27/24 0838  BP: (!) 140/82 (!) 142/91 (!) 143/83 127/76  Pulse: 76 64 73 80  Resp: 18 18 18 18   Temp: 97.9 F (36.6 C) 97.6 F (36.4 C) 97.8 F (36.6 C) 98 F (36.7 C)  TempSrc: Oral Oral Oral   SpO2: 100% 100% 100% 99%  Weight:      Height:       Filed Weights   02/25/24 0500 02/26/24 0617 02/26/24 1040  Weight: 135.2 kg 130.4 kg 130.4 kg    Examination: EOMI NCAT slightly anxious thick neck  Mallampati 4 S1-S2 no murmur Old scar on left chest with some tunneling but no deep wound Abdomen soft no rebound Shrinker and protective device on left leg Unna boot on right with mild edema Power 5/5     Scheduled Meds:  acetaminophen   1,000 mg Oral Q8H   acidophilus  2 capsule Oral TID   atorvastatin   20 mg Oral QHS   Chlorhexidine  Gluconate Cloth  6 each Topical Q0600   docusate sodium   100 mg Oral Daily   ferrous sulfate   325 mg Oral Q breakfast   FreeStyle Libre 3 Sensor  1 each Does not apply Q14 Days   gabapentin   300 mg Oral BID   insulin  aspart  0-15 Units Subcutaneous TID WC   insulin  aspart  0-5 Units Subcutaneous QHS   insulin  aspart  4 Units Subcutaneous TID WC   insulin  glargine-yfgn  20 Units Subcutaneous QHS   metoprolol  tartrate  50 mg Oral BID   multivitamin with minerals  1 tablet Oral Daily   mupirocin  ointment  1 Application Nasal BID   nutrition supplement (JUVEN)  1 packet Oral BID BM   pantoprazole   40 mg Oral Daily   phosphorus  500 mg Oral TID   sodium chloride  flush  3 mL Intravenous Q12H   Continuous Infusions:  sodium chloride      ampicillin -sulbactam (UNASYN ) IV 3 g (02/27/24 0555)    Time 55  Jai-Gurmukh Jarquis Walker, MD  [facility]

## 2024-02-27 NOTE — Evaluation (Signed)
 Physical Therapy Evaluation Patient Details Name: Taylor Hughes MRN: [id number] DOB: Sep 22, 1970 Today's Date: 02/27/2024  History of Present Illness  Taylor Hughes is a 53 y.o. male admitted 02/21/24 with DKA, Lt foot wound, AKI. 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 PMHx: Rt transmet amputation, T2DM, peripheral neuropathy, Afib, HTN, GERD.   Clinical Impression  Pt admitted with above diagnosis. PTA, pt was modI with functional mobility using a SPC and independent with ADLs/IADLs. He lives with his fiance in a ground floor apartment with a level entry. Pt currently with functional limitations due to the deficits listed below (see PT Problem List). He performed bed mobility with CGA. Pt refused to attempt standing reporting I just don't feel confident on one foot. Educated pt on exercises for LLE amputation. Demonstrated each technique and he verbalized understanding. Will provided HEP handout next session. Pt will benefit from acute skilled PT to increase their independence and safety with mobility to allow discharge. Recommend post-acute rehab <3 hours/day of therapy.      If plan is discharge home, recommend the following: Two people to help with walking and/or transfers;A lot of help with bathing/dressing/bathroom;Assistance with cooking/housework;Assist for transportation;Help with stairs or ramp for entrance   Can travel by private vehicle   No    Equipment Recommendations Wheelchair (measurements PT);BSC/3in1;Hoyer lift  Recommendations for Other Services       Functional Status Assessment Patient has had a recent decline in their functional status and demonstrates the ability to make significant improvements in function in a reasonable and predictable amount of time.     Precautions / Restrictions Precautions Precautions: Fall Recall of Precautions/Restrictions: Impaired Precaution/Restrictions Comments:  Wound Vac on LLE Required Braces or Orthoses: Other Brace Other Brace: AmpuShield Restrictions Weight Bearing Restrictions Per Provider Order: No      Mobility  Bed Mobility Overal bed mobility: Needs Assistance Bed Mobility: Supine to Sit, Sit to Supine     Supine to sit: HOB elevated, Used rails, Contact guard Sit to supine: HOB elevated, Used rails, Contact guard assist   General bed mobility comments: Pt sat up on L side of bed with increased time. He slowly brought BLE off EOB and used bed rail to elevate trunk and scoot fwd. Returned to bed slowly lifting BLE in and recentering himself.    Transfers Overall transfer level: Needs assistance                 General transfer comment: Pt refused to attempt stand despite max encouragement and offer to raise bed height as well as get a second assist to increase his confidence. Pt reported I just don't feel confident on one foot.    Ambulation/Gait                  Stairs            Wheelchair Mobility     Tilt Bed    Modified Rankin (Stroke Patients Only)       Balance Overall balance assessment: Needs assistance Sitting-balance support: No upper extremity supported, Feet supported Sitting balance-Leahy Scale: Fair Sitting balance - Comments: Pt sat EOB with supervision.                                     Pertinent Vitals/Pain Pain Assessment Pain Assessment: 0-10 Pain Score: 8  Pain Location: LLE  Pain Descriptors / Indicators: Grimacing, Guarding, Discomfort, Tender Pain Intervention(s): Monitored during session, RN gave pain meds during session, Limited activity within patient's tolerance    Home Living Family/patient expects to be discharged to:: Skilled nursing facility Living Arrangements: Spouse/significant other Available Help at Discharge: Family;Available PRN/intermittently Type of Home: Apartment Home Access: Level entry       Home Layout: One level Home  Equipment: Agricultural consultant (2 wheels);Cane - single point;Wheelchair - manual      Prior Function Prior Level of Function : Independent/Modified Independent;Driving             Mobility Comments: Ambulates with SPC. Denies fall history. ADLs Comments: Indep with ADLs/IADLs. On disability     Extremity/Trunk Assessment   Upper Extremity Assessment Upper Extremity Assessment: Defer to OT evaluation    Lower Extremity Assessment Lower Extremity Assessment: LLE deficits/detail (RLE overall WFL for tasks assessed) LLE Deficits / Details: Pt s/p BKA in stump protector. He demonstrated limited hip/knee ROM. Pt was able to complete a quad set. LLE: Unable to fully assess due to pain LLE Sensation: history of peripheral neuropathy LLE Coordination: decreased gross motor    Cervical / Trunk Assessment Cervical / Trunk Assessment: Other exceptions Cervical / Trunk Exceptions: Increased Body Habitus  Communication   Communication Communication: No apparent difficulties    Cognition Arousal: Alert Behavior During Therapy: WFL for tasks assessed/performed, Anxious   PT - Cognitive impairments: No apparent impairments                       PT - Cognition Comments: Pt A,Ox4. Pt refused to attempt OOB mobility stating I just don't feel confident on one foot. Following commands: Intact       Cueing Cueing Techniques: Verbal cues, Gestural cues     General Comments General comments (skin integrity, edema, etc.): Unna boot on LLE.    Exercises Amputee Exercises Quad Sets: Supine, Left, AROM, 5 reps (with 10 second hold) Other Exercises Other Exercises: Educated pt on exercises for his L BKA in supine. Demonstrated technique and he verbalized understanding.   Assessment/Plan    PT Assessment Patient needs continued PT services  PT Problem List Decreased strength;Decreased range of motion;Decreased activity tolerance;Decreased balance;Decreased mobility;Decreased  knowledge of use of DME;Decreased safety awareness;Pain       PT Treatment Interventions DME instruction;Gait training;Functional mobility training;Therapeutic activities;Therapeutic exercise;Balance training;Neuromuscular re-education;Patient/family education;Wheelchair mobility training    PT Goals (Current goals can be found in the Care Plan section)  Acute Rehab PT Goals Patient Stated Goal: Go to rehab and get stronger before returning home PT Goal Formulation: With patient Time For Goal Achievement: 03/12/24 Potential to Achieve Goals: Fair    Frequency Min 2X/week     Co-evaluation               AM-PAC PT 6 Clicks Mobility  Outcome Measure Help needed turning from your back to your side while in a flat bed without using bedrails?: A Little Help needed moving from lying on your back to sitting on the side of a flat bed without using bedrails?: A Little Help needed moving to and from a bed to a chair (including a wheelchair)?: A Lot Help needed standing up from a chair using your arms (e.g., wheelchair or bedside chair)?: A Lot Help needed to walk in hospital room?: Total Help needed climbing 3-5 steps with a railing? : Total 6 Click Score: 12    End of Session   Activity  Tolerance: Patient tolerated treatment well;Other (comment) (Treatment limited secondary to pt's anxiety/fear and willingness to participate) Patient left: in bed;with call bell/phone within reach;with bed alarm set Nurse Communication: Mobility status PT Visit Diagnosis: Difficulty in walking, not elsewhere classified (R26.2);Unsteadiness on feet (R26.81);Other abnormalities of gait and mobility (R26.89);Pain Pain - Right/Left: Left Pain - part of body: Leg    Time: 1352-1413 PT Time Calculation (min) (ACUTE ONLY): 21 min   Charges:   PT Evaluation $PT Eval Moderate Complexity: 1 Mod   PT General Charges $$ ACUTE PT VISIT: 1 Visit         Randall SAUNDERS, PT, DPT Acute Rehabilitation  Services Office: [phone] Secure Chat Preferred  Taylor Hughes 02/27/2024, 2:38 PM

## 2024-02-28 ENCOUNTER — Ambulatory Visit: Payer: Self-pay | Admitting: Pediatrics

## 2024-02-28 DIAGNOSIS — M86272 Subacute osteomyelitis, left ankle and foot: Secondary | ICD-10-CM | POA: Diagnosis not present

## 2024-02-28 LAB — CBC
HCT: 33 % — ABNORMAL LOW (ref 39.0–52.0)
Hemoglobin: 10 g/dL — ABNORMAL LOW (ref 13.0–17.0)
MCH: 27 pg (ref 26.0–34.0)
MCHC: 30.3 g/dL (ref 30.0–36.0)
MCV: 89.2 fL (ref 80.0–100.0)
Platelets: 366 K/uL (ref 150–400)
RBC: 3.7 MIL/uL — ABNORMAL LOW (ref 4.22–5.81)
RDW: 17.7 % — ABNORMAL HIGH (ref 11.5–15.5)
WBC: 7.1 K/uL (ref 4.0–10.5)
nRBC: 0 % (ref 0.0–0.2)

## 2024-02-28 LAB — BASIC METABOLIC PANEL WITH GFR
Anion gap: 11 (ref 5–15)
BUN: 30 mg/dL — ABNORMAL HIGH (ref 6–20)
CO2: 21 mmol/L — ABNORMAL LOW (ref 22–32)
Calcium: 8.3 mg/dL — ABNORMAL LOW (ref 8.9–10.3)
Chloride: 107 mmol/L (ref 98–111)
Creatinine, Ser: 1.22 mg/dL (ref 0.61–1.24)
GFR, Estimated: >60 mL/min (ref >60)
Glucose, Bld: 230 mg/dL — ABNORMAL HIGH (ref 70–99)
Potassium: 3.8 mmol/L (ref 3.5–5.1)
Sodium: 139 mmol/L (ref 135–145)

## 2024-02-28 LAB — GLUCOSE, CAPILLARY
Glucose-Capillary: 221 mg/dL — ABNORMAL HIGH (ref 70–99)
Glucose-Capillary: 130 mg/dL — ABNORMAL HIGH (ref 70–99)
Glucose-Capillary: 198 mg/dL — ABNORMAL HIGH (ref 70–99)
Glucose-Capillary: 265 mg/dL — ABNORMAL HIGH (ref 70–99)

## 2024-02-28 MED ORDER — ROPIVACAINE HCL 5 MG/ML IJ SOLN
INTRAMUSCULAR | Status: DC | PRN
Start: 2024-02-26 — End: 2024-02-28
  Administered 2024-02-26: 30 mL via PERINEURAL
  Administered 2024-02-26: 20 mL via PERINEURAL

## 2024-02-28 MED ORDER — DEXAMETHASONE SODIUM PHOSPHATE 10 MG/ML IJ SOLN
INTRAMUSCULAR | Status: DC | PRN
  Administered 2024-02-26 (×2): 5 mg

## 2024-02-28 MED ORDER — GABAPENTIN 300 MG PO CAPS
300.0000 mg | ORAL_CAPSULE | Freq: Three times a day (TID) | ORAL | Status: DC
  Administered 2024-02-28 – 2024-03-04 (×16): 300 mg via ORAL
  Filled 2024-02-28 (×16): qty 1

## 2024-02-28 MED ORDER — AMOXICILLIN 500 MG PO CAPS
1000.0000 mg | ORAL_CAPSULE | Freq: Three times a day (TID) | ORAL | Status: DC
  Administered 2024-02-28 – 2024-03-04 (×15): 1000 mg via ORAL
  Filled 2024-02-28 (×17): qty 2

## 2024-02-28 NOTE — Plan of Care (Signed)
 Id brief note   Left bka 7/18; source controlled Couldn't do tee  Per dr Aureliano note, changing iv abx to amoxicillin  1 gram q8hr Dr Dennise back tomorrow and will see if any adjustment needed

## 2024-02-28 NOTE — Plan of Care (Signed)
   Problem: Education: Goal: Knowledge of General Education information will improve Description Including pain rating scale, medication(s)/side effects and non-pharmacologic comfort measures Outcome: Progressing

## 2024-02-28 NOTE — Anesthesia Procedure Notes (Signed)
 Anesthesia Regional Block: Popliteal block   Pre-Anesthetic Checklist: , timeout performed,  Correct Patient, Correct Site, Correct Laterality,  Correct Procedure, Correct Position, site marked,  Risks and benefits discussed,  Pre-op evaluation,  At surgeon's request and post-op pain management  Laterality: Left  Prep: Maximum Sterile Barrier Precautions used, chloraprep       Needles:  Injection technique: Single-shot  Needle Type: Echogenic Stimulator Needle     Needle Length: 9cm  Needle Gauge: 21     Additional Needles:   Procedures:,,,, ultrasound used (permanent image in chart),,    Narrative:  Start time: 02/26/2024 12:30 PM End time: 02/26/2024 12:34 PM Injection made incrementally with aspirations every 5 mL. Anesthesiologist: Niels Marien CROME, MD

## 2024-02-28 NOTE — Progress Notes (Signed)
 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 treated with 6 weeks of antibiotics/Augmentin   Last hospitalized 11/2023 for DKA, left foot infection 7/13 present [facility] ED lethargy metabolic encephalopathy secondary to likely DKA glucose >1000 K6.9 bicarb less than 7 pH 6.8   resuscitated aggressively in the ICU-gastroenterology evaluated secondary to emesis while in ICU hemoglobin remained stable in the 8 range although previously was 10 7/15 echocardiogram 55-60% no evidence of valvular vegetations--- ID discussed with cardiology and was not a candidate for TEE per cardiologist 7/16 transferred to hospitalist service--- ID consulted and recommended de-escalation to 6 weeks of amoxicillin  through 8/24  7/17 duodenitis on EGD small amount of residue in stomach grade C erosive esophagitis which is likely source of coffee-ground 7/18 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   Plan  Metabolic encephalopathy/severe sepsis on admission  Severe DKA on admission DKA-resolved  Sepsis on admission-physiology now improved-secondary to left foot infection with group B strep bacteremia (2/2 bottles 7/13) Antibiotics currently Unasyn -will be discharging on Amoxil  through 8/24 (6 weeks)  Left BKA 7/18-infected lower wound with group B strep bacteremia WBAT and stump shrinker in addition to wound VAC as per Ortho-will require skilled care per therapy eval--TOC aware -scheduled Tylenol  1000 every 8 Can use Oxy IR 10 mg every 4 moderate and 15 mg Q for severe Hold Dilaudid  as caused  itching  Uncontrolled diabetes mellitus with frequent DKA [Home doses 27 Lantus  daily]-A1c 10.9 Meds adjusted currently 4 units 3 times daily meals, sliding scale moderate coverage now with at bedtime and giving 20 units of Semglee  insulin  at night gabapentin  increased to 300 3 times daily Asked diabetic coordinator regarding freestyle libre await input  Grade C erosive gastritis on EGD as above Underlying gastroparesis in 2024 H. pylori was negative-pathology showed reactive gastropathy and metaplasia consistent with peptic duodenitis Continuing Protonix  40 twice daily indefinitely until outpatient follow-up and EGD probably in 8 to 12 weeks Will discharge on oral iron given iron saturation of 7  Paroxysmal A-fib-no arrhythmia seems to be in sinus and no need for rate control Echo this admit EF 55-60%  AKI on admission from DKA physiology-continue to watch creatinine-saline lock IV repeat labs a.m.     Data Reviewed:  Sodium 137 potassium 4.6 BUN/creatinine 21/1.0-->30/1.2 WBC  7.1 hemoglobin 10.0  CBGs ranging to 146-230   DVT prophylaxis: SCD right side  Status is: Inpatient Remains inpatient appropriate because:   Requires placement short-term      Subjective:  Pain 8/10 A little sleepy-Dilaudid  caused itching yesterday so we discontinued it Has phantom leg pain so we discussed changing around her gabapentin  dose Otherwise looks well    Objective + exam Vitals:   02/27/24 1658 02/27/24 2023 02/28/24 0443 02/28/24 0900  BP: 123/71 119/74 121/72 111/62  Pulse: 74 71 77 77  Resp: 18 18 18 18   Temp: 98.1 F (36.7 C) 97.7 F (36.5 C) 98.2 F (36.8 C) 98.7 F (37.1 C)  TempSrc:  Oral Oral Oral  SpO2: 99% 97% 98% 99%  Weight:      Height:  Filed Weights   02/25/24 0500 02/26/24 0617 02/26/24 1040  Weight: 135.2 kg 130.4 kg 130.4 kg    Examination:  EOMI NCAT no focal deficit no icterus no pallor no wheeze Chest is clear Right lower extremity  shows transmetatarsal amputation with mild swelling VAC etc. in place on left side Abdomen soft    Scheduled Meds:  acetaminophen   1,000 mg Oral Q8H   acidophilus  2 capsule Oral TID   atorvastatin   20 mg Oral QHS   Chlorhexidine  Gluconate Cloth  6 each Topical Q0600   docusate sodium   100 mg Oral Daily   ferrous sulfate   325 mg Oral Q breakfast   gabapentin   300 mg Oral TID   insulin  aspart  0-15 Units Subcutaneous TID WC   insulin  aspart  0-5 Units Subcutaneous QHS   insulin  aspart  4 Units Subcutaneous TID WC   insulin  glargine-yfgn  20 Units Subcutaneous QHS   metoprolol  tartrate  50 mg Oral BID   multivitamin with minerals  1 tablet Oral Daily   nutrition supplement (JUVEN)  1 packet Oral BID BM   pantoprazole   40 mg Oral Daily   sodium chloride  flush  3 mL Intravenous Q12H   Continuous Infusions:  ampicillin -sulbactam (UNASYN ) IV 3 g (02/28/24 0509)    Time 25  Taylor Meaghen Vecchiarelli, MD  [facility]

## 2024-02-28 NOTE — Progress Notes (Signed)
 Spoke to [employer] ortho tech about [employer].  She states she will be here in the afternoon to wrap the leg. MD removed previous unna boot this am.

## 2024-02-28 NOTE — Progress Notes (Signed)
 Orthopedic Tech Progress Note Patient Details:  Taylor Hughes [birth year] [id number]  Unna boot applied to the RLE today. This will need to be changed Wednesday or Thursday depending on if he remains admitted.  Ortho Devices Type of Ortho Device: Radio broadcast assistant Ortho Device/Splint Location: RLE Ortho Device/Splint Interventions: Ordered, Application, Adjustment   Post Interventions Patient Tolerated: Well Instructions Provided: Care of device, Adjustment of device  Shayanne Gomm Ronal Brasil 02/28/2024, 4:25 PM

## 2024-02-28 NOTE — Anesthesia Procedure Notes (Signed)
 Anesthesia Regional Block: Adductor canal block   Pre-Anesthetic Checklist: , timeout performed,  Correct Patient, Correct Site, Correct Laterality,  Correct Procedure, Correct Position, site marked,  Risks and benefits discussed,  Pre-op evaluation,  At surgeon's request and post-op pain management  Laterality: Left  Prep: Maximum Sterile Barrier Precautions used, chloraprep       Needles:  Injection technique: Single-shot  Needle Type: Echogenic Stimulator Needle     Needle Length: 9cm  Needle Gauge: 21     Additional Needles:   Procedures:,,,, ultrasound used (permanent image in chart),,    Narrative:  Start time: 02/26/2024 12:34 PM End time: 02/26/2024 12:38 PM Injection made incrementally with aspirations every 5 mL. Anesthesiologist: Niels Marien CROME, MD

## 2024-02-28 NOTE — Anesthesia Postprocedure Evaluation (Signed)
 Anesthesia Post Note  Patient: Taylor Hughes  Procedure(s) Performed: AMPUTATION BELOW KNEE, LEFT (Left: Knee) APPLICATION, WOUND VAC (Left: Leg Lower)     Patient location during evaluation: PACU Anesthesia Type: Regional and General Level of consciousness: awake and alert Pain management: pain level controlled Vital Signs Assessment: post-procedure vital signs reviewed and stable Respiratory status: spontaneous breathing, nonlabored ventilation, respiratory function stable and patient connected to nasal cannula oxygen  Cardiovascular status: blood pressure returned to baseline and stable Postop Assessment: no apparent nausea or vomiting Anesthetic complications: no   No notable events documented.  Last Vitals:  Vitals:   02/27/24 2023 02/28/24 0443  BP: 119/74 121/72  Pulse: 71 77  Resp: 18 18  Temp: 36.5 C 36.8 C  SpO2: 97% 98%    Last Pain:  Vitals:   02/28/24 0443  TempSrc: Oral  PainSc:                  Jamaya Sleeth L Anabela Crayton

## 2024-02-28 NOTE — Plan of Care (Signed)

## 2024-02-29 ENCOUNTER — Other Ambulatory Visit (HOSPITAL_COMMUNITY): Payer: Self-pay

## 2024-02-29 ENCOUNTER — Encounter: Payer: Self-pay | Admitting: Gastroenterology

## 2024-02-29 ENCOUNTER — Encounter (HOSPITAL_COMMUNITY): Payer: Self-pay | Admitting: Orthopedic Surgery

## 2024-02-29 DIAGNOSIS — B951 Streptococcus, group B, as the cause of diseases classified elsewhere: Secondary | ICD-10-CM | POA: Diagnosis not present

## 2024-02-29 DIAGNOSIS — Z794 Long term (current) use of insulin: Secondary | ICD-10-CM | POA: Diagnosis not present

## 2024-02-29 DIAGNOSIS — R7881 Bacteremia: Secondary | ICD-10-CM | POA: Diagnosis not present

## 2024-02-29 DIAGNOSIS — M86272 Subacute osteomyelitis, left ankle and foot: Secondary | ICD-10-CM | POA: Diagnosis not present

## 2024-02-29 DIAGNOSIS — L97529 Non-pressure chronic ulcer of other part of left foot with unspecified severity: Secondary | ICD-10-CM | POA: Diagnosis not present

## 2024-02-29 DIAGNOSIS — E1110 Type 2 diabetes mellitus with ketoacidosis without coma: Secondary | ICD-10-CM | POA: Diagnosis not present

## 2024-02-29 LAB — GLUCOSE, CAPILLARY
Glucose-Capillary: 284 mg/dL — ABNORMAL HIGH (ref 70–99)
Glucose-Capillary: 210 mg/dL — ABNORMAL HIGH (ref 70–99)
Glucose-Capillary: 138 mg/dL — ABNORMAL HIGH (ref 70–99)
Glucose-Capillary: 183 mg/dL — ABNORMAL HIGH (ref 70–99)

## 2024-02-29 LAB — BASIC METABOLIC PANEL WITH GFR
Anion gap: 10 (ref 5–15)
BUN: 26 mg/dL — ABNORMAL HIGH (ref 6–20)
CO2: 22 mmol/L (ref 22–32)
Calcium: 8.1 mg/dL — ABNORMAL LOW (ref 8.9–10.3)
Chloride: 105 mmol/L (ref 98–111)
Creatinine, Ser: 0.91 mg/dL (ref 0.61–1.24)
GFR, Estimated: >60 mL/min (ref >60)
Glucose, Bld: 249 mg/dL — ABNORMAL HIGH (ref 70–99)
Potassium: 3.6 mmol/L (ref 3.5–5.1)
Sodium: 137 mmol/L (ref 135–145)

## 2024-02-29 MED ORDER — INSULIN GLARGINE-YFGN 100 UNIT/ML ~~LOC~~ SOLN
25.0000 [IU] | Freq: Every day | SUBCUTANEOUS | Status: DC
  Administered 2024-02-29 – 2024-03-01 (×2): 25 [IU] via SUBCUTANEOUS
  Filled 2024-02-29 (×3): qty 0.25

## 2024-02-29 MED ORDER — LOPERAMIDE HCL 2 MG PO CAPS
2.0000 mg | ORAL_CAPSULE | Freq: Once | ORAL | Status: AC
  Administered 2024-02-29: 2 mg via ORAL
  Filled 2024-02-29: qty 1

## 2024-02-29 MED ORDER — AMOXICILLIN 500 MG PO CAPS
1000.0000 mg | ORAL_CAPSULE | Freq: Three times a day (TID) | ORAL | 0 refills | Status: AC
  Filled 2024-02-29: qty 204, 34d supply, fill #0

## 2024-02-29 NOTE — Progress Notes (Signed)
 Physical Therapy Treatment Patient Details Name: Taylor Hughes MRN: [id number] DOB: 09-14-1970 Today's Date: 02/29/2024   History of Present Illness Taylor Hughes is a 53 y.o. male admitted 02/21/24 with DKA, Lt foot wound, AKI. 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 PMHx: Rt transmet amputation, T2DM, peripheral neuropathy, Afib, HTN, GERD.    PT Comments  Pt seen for PT tx with pt agreeable. Provided pt with amputation HEP but did not review it as pt reported need to have BM. Pt is able to complete supine>sit with supervision & hospital bed features, sit>supine with mod assist. Pt attempts sit<>stand from EOB x 3 with pt successful on 3rd attempt; tolerates standing ~2 minutes for dependent peri hygiene. Pt returned to bed, noting he used all his energy to stand. Pt rolls L<>R to allow PT & NT to change bed linens. Pt is making steady progress with mobility, requires encouragement 2/2 anxiety with mobility, does fatigue quickly. Continue to recommend post acute rehab <3 hours therapy/day at this time.    If plan is discharge home, recommend the following: Two people to help with walking and/or transfers;A lot of help with bathing/dressing/bathroom;Assistance with cooking/housework;Assist for transportation;Help with stairs or ramp for entrance   Can travel by private vehicle     No  Equipment Recommendations  Wheelchair (measurements PT);BSC/3in1;Hoyer lift;Wheelchair cushion (measurements PT);Hospital bed    Recommendations for Other Services       Precautions / Restrictions Precautions Precautions: Fall Precaution/Restrictions Comments: LLE wound vac, RLE unna boot Required Braces or Orthoses: Other Brace Other Brace: LLE limb guard Restrictions Weight Bearing Restrictions Per Provider Order: No     Mobility  Bed Mobility Overal bed mobility: Needs Assistance Bed Mobility: Supine to Sit Rolling: Min  assist, Used rails   Supine to sit: Supervision, HOB elevated, Used rails (extra time to exit L side of bed) Sit to supine: Mod assist (elevate BLE onto bed)   General bed mobility comments: Pt able to scoot to [name] with bed rails, bed in trendelenburg position to assist.    Transfers Overall transfer level: Needs assistance Equipment used: Rolling walker (2 wheels) Transfers: Sit to/from Stand Sit to Stand: Mod assist, From elevated surface           General transfer comment: sit>stand from elevated EOB with cuing re: hand placement, 3 attempts with pt succesful on last one    Ambulation/Gait                   Stairs             Wheelchair Mobility     Tilt Bed    Modified Rankin (Stroke Patients Only)       Balance Overall balance assessment: Needs assistance Sitting-balance support: Feet supported, Bilateral upper extremity supported Sitting balance-Leahy Scale: Fair     Standing balance support: During functional activity, Bilateral upper extremity supported, Reliant on assistive device for balance Standing balance-Leahy Scale: Poor                              Communication Communication Communication: No apparent difficulties  Cognition Arousal: Alert Behavior During Therapy: Anxious, WFL for tasks assessed/performed   PT - Cognitive impairments: No apparent impairments                         Following commands: Intact  Cueing Cueing Techniques: Verbal cues, Gestural cues  Exercises      General Comments General comments (skin integrity, edema, etc.): incontinent BM, requires dependent assist for peri hygiene in standing.      Pertinent Vitals/Pain Pain Assessment Pain Assessment: Faces Faces Pain Scale: Hurts little more Pain Location: LLE with touch, movement, dependent position Pain Descriptors / Indicators: Discomfort, Grimacing Pain Intervention(s): Monitored during session, Repositioned,  Limited activity within patient's tolerance    Home Living                          Prior Function            PT Goals (current goals can now be found in the care plan section) Acute Rehab PT Goals Patient Stated Goal: Go to rehab and get stronger before returning home PT Goal Formulation: With patient Time For Goal Achievement: 03/12/24 Potential to Achieve Goals: Fair Progress towards PT goals: Progressing toward goals    Frequency    Min 2X/week      PT Plan      Co-evaluation              AM-PAC PT 6 Clicks Mobility   Outcome Measure  Help needed turning from your back to your side while in a flat bed without using bedrails?: A Little Help needed moving from lying on your back to sitting on the side of a flat bed without using bedrails?: A Little Help needed moving to and from a bed to a chair (including a wheelchair)?: Total Help needed standing up from a chair using your arms (e.g., wheelchair or bedside chair)?: Total Help needed to walk in hospital room?: Total Help needed climbing 3-5 steps with a railing? : Total 6 Click Score: 10    End of Session Equipment Utilized During Treatment: Gait belt Activity Tolerance: Patient tolerated treatment well;Patient limited by fatigue Patient left: in bed;with call bell/phone within reach;with bed alarm set Nurse Communication: Mobility status (blood noted to LLE limb guard & posterior RLE, pt appears to have blister?) PT Visit Diagnosis: Difficulty in walking, not elsewhere classified (R26.2);Unsteadiness on feet (R26.81);Other abnormalities of gait and mobility (R26.89);Pain;Muscle weakness (generalized) (M62.81) Pain - Right/Left: Left Pain - part of body: Leg     Time: 9040-8962 PT Time Calculation (min) (ACUTE ONLY): 38 min  Charges:    $Therapeutic Activity: 38-52 mins PT General Charges $$ ACUTE PT VISIT: 1 Visit                     Taylor Hughes, PT, DPT 02/29/24, 10:48  AM    Taylor Taylor Hughes 02/29/2024, 10:46 AM

## 2024-02-29 NOTE — TOC Initial Note (Signed)
 Transition of Care [facility]) - Initial/Assessment Note    Patient Details  Name: Taylor Hughes MRN: [id number] Date of Birth: Jul 01, 1971  [facility] CM/SW Contact:    Jeoffrey LITTIE [name], LCSW Phone Number: 02/29/2024, 2:59 PM  Clinical Narrative:                 CSW completed SNF workup with pt. Pt agreeable to SNF once medically ready for d/c. CSW will send out referrals and provide pt with bed offers.  Expected Discharge Plan: OP Rehab Barriers to Discharge: Continued Medical Work up, SNF Pending bed offer   Patient Goals and CMS Choice Patient states their goals for this hospitalization and ongoing recovery are:: SNF          Expected Discharge Plan and Services   Discharge Planning Services: CM Consult Post Acute Care Choice: Durable Medical Equipment Living arrangements for the past 2 months: Apartment                 DME Arranged: Walker rolling with seat DME Agency: [employer] Date DME Agency Contacted: 02/24/24 Time DME Agency Contacted: [phone] Representative spoke with at DME Agency: zack HH Arranged: NA HH Agency: NA        Prior Living Arrangements/Services Living arrangements for the past 2 months: Apartment Lives with:: Spouse Patient language and need for interpreter reviewed:: Yes Do you feel safe going back to the place where you live?: Yes      Need for Family Participation in Patient Care: Yes (Comment) Care giver support system in place?: Yes (comment) Current home services: DME (cane) Criminal Activity/Legal Involvement Pertinent to Current Situation/Hospitalization: No - Comment as needed  Activities of Daily Living      Permission Sought/Granted Permission sought to share information with : Magazine features editor, Family Supports       Permission granted to share info w AGENCY: SNFs        Emotional Assessment Appearance:: Appears stated age Attitude/Demeanor/Rapport: Gracious Affect (typically observed): Calm,  Accepting Orientation: : Oriented to Self, Oriented to Place, Oriented to  Time, Oriented to Situation Alcohol / Substance Use: Not Applicable Psych Involvement: No (comment)  Admission diagnosis:  DKA (diabetic ketoacidosis) (HCC) [E11.10] Diabetic ketoacidosis without coma associated with other specified diabetes mellitus (HCC) [E13.10] Osteomyelitis, unspecified site, unspecified type (HCC) [M86.9] Patient Active Problem List   Diagnosis Date Noted   Cutaneous abscess of left foot 02/25/2024   Erosive esophagitis 02/25/2024   Duodenitis 02/25/2024   Iron deficiency anemia 02/24/2024   Coffee ground emesis 02/23/2024   Encephalopathy acute 02/22/2024   Non-pressure chronic ulcer of other part of left foot limited to breakdown of skin (HCC) 11/13/2023   Charcot joint of left foot 11/13/2023   Acute pyelonephritis 11/11/2023   DKA, type 2 (HCC) 11/10/2023   Flank pain 11/10/2023   Left thigh pain 11/10/2023   Neck pain 11/10/2023   Elevated troponin 11/10/2023   Lactic acidosis 11/10/2023   Acute cystitis with hematuria 06/25/2023   History of transmetatarsal amputation of right foot (HCC) 04/27/2023   Lymphedema 03/24/2023   Right ventricular enlargement 03/24/2023   Diabetic foot infection (HCC) 03/23/2023   Subacute osteomyelitis, left ankle and foot (HCC) 03/22/2023   Atrial fibrillation, chronic (HCC) 03/22/2023   HLD (hyperlipidemia) 03/22/2023   Gastroparesis 03/22/2023   Depression 09/08/2022   GERD (gastroesophageal reflux disease) 03/03/2022   Diabetes mellitus (HCC) 09/19/2021   PAF (paroxysmal atrial fibrillation) (HCC)    Acute on chronic anemia  Venous insufficiency (chronic) (peripheral) 03/15/2019   Edema of both lower extremities due to peripheral venous insufficiency 03/15/2019   Essential hypertension 11/23/2018   AKI (acute kidney injury) (HCC) 11/23/2018   Morbid obesity with BMI of 45.0-49.9, adult (HCC) 11/23/2018   Type 2 diabetes mellitus with  other specified complication (HCC) 11/23/2018   Diabetic infection of left foot (HCC)    DKA (diabetic ketoacidosis) (HCC) 07/30/2018   DMII (diabetes mellitus, type 2) (HCC) 07/28/2018   PCP:  Delbert Clam, MD Pharmacy:   [facility] [address], [address] [place] [address] Phone: [phone] Fax: [phone]  [facility] [address] [place] [address] Phone: [phone] Fax: [phone]     Social Drivers of Health (SDOH) Social History: SDOH Screenings   Food Insecurity: Food Insecurity Present (02/24/2024)  Housing: Low Risk  (02/24/2024)  Transportation Needs: No Transportation Needs (02/24/2024)  Utilities: Not At Risk (02/24/2024)  Depression (PHQ2-9): Low Risk  (12/03/2023)  Financial Resource Strain: Medium Risk (06/24/2023)  Physical Activity: Unknown (06/24/2023)  Social Connections: Socially Isolated (06/24/2023)  Stress: No Stress Concern Present (06/24/2023)  Tobacco Use: Low Risk  (02/26/2024)   SDOH Interventions:     Readmission Risk Interventions    03/24/2023    4:56 PM 08/15/2022    1:23 PM  Readmission Risk Prevention Plan  Transportation Screening Complete Complete  PCP or Specialist Appt within 5-7 Days  Complete  PCP or Specialist Appt within 3-5 Days Complete   Home Care Screening  Complete  Medication Review (RN CM)  Complete  HRI or Home Care Consult Complete   Social Work Consult for Recovery Care Planning/Counseling Complete   Palliative Care Screening Not Applicable   Medication Review Oceanographer) Referral to Pharmacy

## 2024-02-29 NOTE — Plan of Care (Signed)
   Problem: Education: Goal: Knowledge of General Education information will improve Description Including pain rating scale, medication(s)/side effects and non-pharmacologic comfort measures Outcome: Progressing

## 2024-02-29 NOTE — Inpatient Diabetes Management (Signed)
 Inpatient Diabetes Program Recommendations  AACE/ADA: New Consensus Statement on Inpatient Glycemic Control (2015)  Target Ranges:  Prepandial:   less than 140 mg/dL      Peak postprandial:   less than 180 mg/dL (1-2 hours)      Critically ill patients:  140 - 180 mg/dL   Lab Results  Component Value Date   GLUCAP 210 (H) 02/29/2024   HGBA1C 10.9 (H) 02/22/2024    Review of Glycemic Control  Diabetes history: DM 2 Outpatient Diabetes medications:  FSL3 Current orders for Inpatient glycemic control:  Novolog  0-15 units tid with meals and HS Semglee  25 units q HS Novolog  4 units tid with meals  Spoke with pt at bedside regarding CGM. Pt reports having 2 Freestyle Libre 3 sensors at home. I also spoke to pt about downloading the app in order to use the sensor. Pt does not need CGM supplies at time of discharge.  Pt had questions about inpatient rehab and states he desires this over outside rehab facilities. Sent message to rehab coordinator Leita to update her on pt wanting an update on that closer to d/c.  Thanks,  Clotilda Bull RN, MSN, BC-ADM Inpatient Diabetes Coordinator Team Pager [phone] (8a-5p)

## 2024-02-29 NOTE — [place] FL2 (Signed)
 [place]  MEDICAID FL2 LEVEL OF CARE FORM     IDENTIFICATION  Patient Name: Taylor Hughes Birthdate: 1970-08-30 Sex: male Admission Date (Current Location): 02/21/2024  [facility] and [place] Number:  Producer, television/film/video and Address:  [facility], [address] [place], [place] [address]      Provider Number: [id number]  Attending Physician Name and Address:  Royal Sill, MD  Relative Name and Phone Number:       Current Level of Care: Hospital Recommended Level of Care: Skilled Nursing Facility Prior Approval Number:    Date Approved/Denied:   PASRR Number: [id number]  Discharge Plan: SNF    Current Diagnoses: Patient Active Problem List   Diagnosis Date Noted   Cutaneous abscess of left foot 02/25/2024   Erosive esophagitis 02/25/2024   Duodenitis 02/25/2024   Iron deficiency anemia 02/24/2024   Coffee ground emesis 02/23/2024   Encephalopathy acute 02/22/2024   Non-pressure chronic ulcer of other part of left foot limited to breakdown of skin (HCC) 11/13/2023   Charcot joint of left foot 11/13/2023   Acute pyelonephritis 11/11/2023   DKA, type 2 (HCC) 11/10/2023   Flank pain 11/10/2023   Left thigh pain 11/10/2023   Neck pain 11/10/2023   Elevated troponin 11/10/2023   Lactic acidosis 11/10/2023   Acute cystitis with hematuria 06/25/2023   History of transmetatarsal amputation of right foot (HCC) 04/27/2023   Lymphedema 03/24/2023   Right ventricular enlargement 03/24/2023   Diabetic foot infection (HCC) 03/23/2023   Subacute osteomyelitis, left ankle and foot (HCC) 03/22/2023   Atrial fibrillation, chronic (HCC) 03/22/2023   HLD (hyperlipidemia) 03/22/2023   Gastroparesis 03/22/2023   Depression 09/08/2022   GERD (gastroesophageal reflux disease) 03/03/2022   Diabetes mellitus (HCC) 09/19/2021   PAF (paroxysmal atrial fibrillation) (HCC)    Acute on chronic anemia    Venous insufficiency (chronic) (peripheral)  03/15/2019   Edema of both lower extremities due to peripheral venous insufficiency 03/15/2019   Essential hypertension 11/23/2018   AKI (acute kidney injury) (HCC) 11/23/2018   Morbid obesity with BMI of 45.0-49.9, adult (HCC) 11/23/2018   Type 2 diabetes mellitus with other specified complication (HCC) 11/23/2018   Diabetic infection of left foot (HCC)    DKA (diabetic ketoacidosis) (HCC) 07/30/2018   DMII (diabetes mellitus, type 2) (HCC) 07/28/2018    Orientation RESPIRATION BLADDER Height & Weight     Self, Time, Place, Situation  Normal Incontinent Weight: (!) 302 lb 14.6 oz (137.4 kg) Height:  6' (182.9 cm)  BEHAVIORAL SYMPTOMS/MOOD NEUROLOGICAL BOWEL NUTRITION STATUS      Continent Diet (See DC Summary)  AMBULATORY STATUS COMMUNICATION OF NEEDS Skin   Extensive Assist Verbally Other (Comment) (Wound on left leg; diabetic ulcer on left heel; closed surgical incision on left leg)                       Personal Care Assistance Level of Assistance  Bathing, Feeding, Dressing Bathing Assistance: Maximum assistance Feeding assistance: Limited assistance Dressing Assistance: Maximum assistance     Functional Limitations Info  Sight, Hearing, Speech Sight Info: Impaired Hearing Info: Adequate Speech Info: Adequate    SPECIAL CARE FACTORS FREQUENCY  PT (By licensed PT), OT (By licensed OT)     PT Frequency: 5x/week OT Frequency: 5x/week            Contractures Contractures Info: Not present    Additional Factors Info  Code Status, Allergies Code Status Info: Full Allergies Info:  No known allergies           Current Medications (02/29/2024):  This is the current hospital active medication list Current Facility-Administered Medications  Medication Dose Route Frequency Provider Last Rate Last Admin   acetaminophen  (TYLENOL ) tablet 1,000 mg  1,000 mg Oral Q8H Samtani, Jai-Gurmukh, MD   1,000 mg at 02/29/24 1353   acetaminophen  (TYLENOL ) tablet 325-650 mg   325-650 mg Oral Q6H PRN Gerome Maurilio HERO, PA-C   650 mg at 02/29/24 1108   acidophilus (RISAQUAD) capsule 2 capsule  2 capsule Oral TID Shahmehdi, Seyed A, MD   2 capsule at 02/29/24 0925   amoxicillin  (AMOXIL ) capsule 1,000 mg  1,000 mg Oral Q8H Dennise Kingsley, MD   1,000 mg at 02/29/24 1353   atorvastatin  (LIPITOR) tablet 20 mg  20 mg Oral QHS Gerome Maurilio M, PA-C   20 mg at 02/28/24 2147   Chlorhexidine  Gluconate Cloth 2 % PADS 6 each  6 each Topical Q0600 Gerome Maurilio HERO, PA-C   6 each at 02/29/24 9377   dextrose  50 % solution 0-50 mL  0-50 mL Intravenous PRN Gerome Maurilio HERO, PA-C       ferrous sulfate  tablet 325 mg  325 mg Oral Q breakfast Gerome Maurilio M, PA-C   325 mg at 02/29/24 9074   gabapentin  (NEURONTIN ) capsule 300 mg  300 mg Oral TID Samtani, Jai-Gurmukh, MD   300 mg at 02/29/24 9074   hydrALAZINE  (APRESOLINE ) injection 5 mg  5 mg Intravenous Q20 Min PRN Gerome Maurilio M, PA-C       insulin  aspart (novoLOG ) injection 0-15 Units  0-15 Units Subcutaneous TID WC Samtani, Jai-Gurmukh, MD   5 Units at 02/29/24 1245   insulin  aspart (novoLOG ) injection 0-5 Units  0-5 Units Subcutaneous QHS Samtani, Jai-Gurmukh, MD   3 Units at 02/28/24 2144   insulin  aspart (novoLOG ) injection 4 Units  4 Units Subcutaneous TID WC Samtani, Jai-Gurmukh, MD   4 Units at 02/29/24 1245   insulin  glargine-yfgn (SEMGLEE ) injection 25 Units  25 Units Subcutaneous QHS Samtani, Jai-Gurmukh, MD       metoprolol  tartrate (LOPRESSOR ) injection 2.5-5 mg  2.5-5 mg Intravenous Q3H PRN Gerome Maurilio M, PA-C   5 mg at 02/22/24 1429   metoprolol  tartrate (LOPRESSOR ) tablet 50 mg  50 mg Oral BID Gerome Maurilio M, PA-C   50 mg at 02/29/24 0925   multivitamin with minerals tablet 1 tablet  1 tablet Oral Daily Gerome Maurilio HERO, PA-C   1 tablet at 02/29/24 9074   nutrition supplement (JUVEN) (JUVEN) powder packet 1 packet  1 packet Oral BID BM Gerome Maurilio HERO, PA-C   1 packet at 02/29/24 1353   ondansetron  (ZOFRAN ) injection 4 mg  4  mg Intravenous Q6H PRN Gerome Maurilio M, PA-C   4 mg at 02/25/24 1530   Oral care mouth rinse  15 mL Mouth Rinse PRN Gerome Maurilio M, PA-C       oxyCODONE  (Oxy IR/ROXICODONE ) immediate release tablet 10 mg  10 mg Oral Q4H PRN Samtani, Jai-Gurmukh, MD   10 mg at 02/28/24 0855   oxyCODONE  (Oxy IR/ROXICODONE ) immediate release tablet 15 mg  15 mg Oral Q4H PRN Samtani, Jai-Gurmukh, MD   15 mg at 02/29/24 1108   pantoprazole  (PROTONIX ) EC tablet 40 mg  40 mg Oral Daily Gerome Maurilio M, PA-C   40 mg at 02/29/24 0925   phenol (CHLORASEPTIC) mouth spray 1 spray  1 spray Mouth/Throat PRN Chavez, Abigail, NP   1 spray  at 02/27/24 0202   sodium chloride  flush (NS) 0.9 % injection 3 mL  3 mL Intravenous Q12H Gerome Maurilio HERO, PA-C   3 mL at 02/29/24 9061   sodium chloride  flush (NS) 0.9 % injection 3 mL  3 mL Intravenous PRN Collins, Emma M, PA-C         Discharge Medications: Please see discharge summary for a list of discharge medications.  Relevant Imaging Results:  Relevant Lab Results:   Additional Information SSN: [id number]  Taylor LITTIE [name], LCSW

## 2024-02-29 NOTE — Progress Notes (Signed)
 Patient ID: Taylor Hughes, male   DOB: 1970/08/21, 53 y.o.   MRN: [id number] Patient is status post left below-knee amputation.  Postoperative day 3.  The Unna boot was changed on the right leg.  There is no drainage in the wound VAC canister there is a good suction fit anticipate patient can discharge  with a Prevena plus portable pump.

## 2024-02-29 NOTE — Progress Notes (Signed)
 [facility] ROUNDING NOTE Taylor Hughes FMW:[id number]  DOB: [birth year]  DOA: 02/21/2024  PCP: Delbert Clam, MD  02/29/2024,9:12 AM  LOS: 8 days    Code Status: Full code   from: Home current Dispo: Unclear as yet    53 year old black male known history of diabetes mellitus since 2009 HTN Prior chest wall abscess and sternoclavicular MSSA infection treated in 2020 Prior bilateral feet osteomyelitis with right foot osteo status post TMA treated with 6 weeks of antibiotics/Augmentin   Last hospitalized 11/2023 for DKA, left foot infection 7/13 present [facility] ED lethargy metabolic encephalopathy secondary to likely DKA glucose >1000 K6.9 bicarb less than 7 pH 6.8   resuscitated aggressively in the ICU-gastroenterology evaluated secondary to emesis while in ICU hemoglobin remained stable in the 8 range although previously was 10 7/15 echocardiogram 55-60% no evidence of valvular vegetations--- ID discussed with cardiology and was not a candidate for TEE per cardiologist 7/16 transferred to hospitalist service--- ID consulted and recommended de-escalation to 6 weeks of amoxicillin  through 8/24  7/17 duodenitis on EGD small amount of residue in stomach grade C erosive esophagitis which is likely source of coffee-ground 7/18 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-infected lower wound with group B strep bacteremia WBAT and stump shrinker in addition to wound VAC as per Ortho-will require skilled care per therapy eval--TOC aware awaiting skilled  bed -scheduled Tylenol  1000 every 8 Can use Oxy IR 10 mg every 4 moderate and 15 mg Q for severe Hold Dilaudid  as caused itching  Uncontrolled diabetes mellitus with frequent DKA [Home doses 27 Lantus  daily]-A1c 10.9 Short acting 4 units 3 times daily meals, sliding scale moderate coverage now with at bedtime  Increased to 25 units Semglee  insulin   gabapentin  increased to 300 3 times daily Asked diabetic coordinator regarding freestyle libre await input--- appreciate follow-up in advance  Grade C erosive gastritis on EGD as above Underlying gastroparesis in 2024 H. pylori was negative-pathology showed reactive gastropathy and metaplasia consistent with peptic duodenitis Continuing Protonix  40 twice daily indefinitely until outpatient follow-up and EGD probably in 8 to 12 weeks Will discharge on oral iron given iron saturation of 7  Paroxysmal A-fib-no arrhythmia seems to be in sinus and no need for rate control Echo this admit EF 55-60% Continues metoprolol  50 twice daily, PRNs hydralazine , Toprol  IV  AKI on admission from DKA physiology-improving continue fluids   Data Reviewed:  Sodium 137 p potassium 3.6 BUN/creatinine 26/0.9 CBG ranging 281 through 369  DVT prophylaxis: SCD right side  Status is: Inpatient Remains inpatient appropriate because:   Requires placement short-term      Subjective:  Phantom pain is better-he is less sleepy-states pain is about 6/10-no chest pain no fever Reporting multiple loose stools Asking to go visit girlfriend in 6 N. 26 who is admitted as well Otherwise seems pretty good and eager to work with therapy    Objective + exam Vitals:   02/28/24 2122 02/29/24 0500 02/29/24 0602 02/29/24 0843  BP: 100/65  119/69 125/83  Pulse: 80  73 84  Resp:      Temp: 98.3 F (36.8  C)  98.6 F (37 C) 98.1 F (36.7 C)  TempSrc: Oral   Oral  SpO2: 98%  100% 100%  Weight:  (!) 137.4 kg    Height:       Filed Weights   02/26/24 0617 02/26/24  1040 02/29/24 0500  Weight: 130.4 kg 130.4 kg (!) 137.4 kg    Examination:  EOMI NCAT no focal deficit Left supraclavicular area has old surgical wound S1-S2 no murmur Chest is clear no wheeze Abdomen is soft no rebound Patient has Unna boot on the right lower extremity and stump shrinker etc. on the left wound not examined     Scheduled Meds:  acetaminophen   1,000 mg Oral Q8H   acidophilus  2 capsule Oral TID   amoxicillin   1,000 mg Oral Q8H   atorvastatin   20 mg Oral QHS   Chlorhexidine  Gluconate Cloth  6 each Topical Q0600   docusate sodium   100 mg Oral Daily   ferrous sulfate   325 mg Oral Q breakfast   gabapentin   300 mg Oral TID   insulin  aspart  0-15 Units Subcutaneous TID WC   insulin  aspart  0-5 Units Subcutaneous QHS   insulin  aspart  4 Units Subcutaneous TID WC   insulin  glargine-yfgn  20 Units Subcutaneous QHS   metoprolol  tartrate  50 mg Oral BID   multivitamin with minerals  1 tablet Oral Daily   nutrition supplement (JUVEN)  1 packet Oral BID BM   pantoprazole   40 mg Oral Daily   sodium chloride  flush  3 mL Intravenous Q12H   Continuous Infusions: Time 25  Jai-Gurmukh Kelsa Jaworowski, MD  [facility]

## 2024-02-29 NOTE — Progress Notes (Signed)
 Nutrition Follow-up  DOCUMENTATION CODES:   Obesity unspecified  INTERVENTION:   Encourage PO intake -Currently on carb modified diet Glucerna Shake po TID, each supplement provides 220 kcal and 10 grams of protein Add double proteins with meals Add 2 Vanilla greek yogurt to meal trays Continue MVI with minerals daily Continue Juven BID to support wound healing   NUTRITION DIAGNOSIS:   Inadequate oral intake related to chronic illness (gastroparesis and uncontrolled diabetes) as evidenced by per patient/family report, energy intake < or equal to 75% for > or equal to 1 month. - Still applicable   GOAL:   Patient will meet greater than or equal to 90% of their needs - Progressing   MONITOR:   Diet advancement, Labs, Weight trends  REASON FOR ASSESSMENT:   Consult Assessment of nutrition requirement/status  ASSESSMENT:   Pt admitted with DKA (initial glucose >1000) and ongoing L foot wound. PMH significant for recent admission  (11/2023) for DKA and L foot wound treatment, afib, DM2 and gastroparesis.  7/18 - L BKA   POD 3 from left BKA. Pt reports feeling much better in the last couple of days. Now has a very good appetite, pt states his stomach is like a tornado with how hungry he is. RD provided Glucerna to pt. Pt wanted these x3 a day also wanted x2 vanilla greek yogurts to tray and double proteins with meals. Has been eating very well. Had 100% of an omelet and potatoes for breakfast this morning. Encouraged protein intake for wound healing. Endorses no N/V.   Admit weight: 156 kg Current weight: 137.4 kg    Average Meal Intake: 7/19: 100% intake x 3 recorded meals 7/20: 100% intake x 2 meals  7/21: 100% intake x 1 meal   Nutritionally Relevant Medications: Scheduled Meds:  acetaminophen   1,000 mg Oral Q8H   acidophilus  2 capsule Oral TID   amoxicillin   1,000 mg Oral Q8H   atorvastatin   20 mg Oral QHS   Chlorhexidine  Gluconate Cloth  6 each Topical Q0600    ferrous sulfate   325 mg Oral Q breakfast   gabapentin   300 mg Oral TID   insulin  aspart  0-15 Units Subcutaneous TID WC   insulin  aspart  0-5 Units Subcutaneous QHS   insulin  aspart  4 Units Subcutaneous TID WC   insulin  glargine-yfgn  25 Units Subcutaneous QHS   metoprolol  tartrate  50 mg Oral BID   multivitamin with minerals  1 tablet Oral Daily   nutrition supplement (JUVEN)  1 packet Oral BID BM   pantoprazole   40 mg Oral Daily   sodium chloride  flush  3 mL Intravenous Q12H   Labs Reviewed: BUN 26 Phosphorus 2.1 Albumin  1.4  CBG ranges from 130-284 mg/dL over the last 24 hours HgbA1c 10.9   Diet Order:   Diet Order             Diet Carb Modified Fluid consistency: Thin; Room service appropriate? Yes  Diet effective now                   EDUCATION NEEDS:   Not appropriate for education at this time  Skin:  Skin Assessment: Skin Integrity Issues: Skin Integrity Issues:: Diabetic Ulcer Diabetic Ulcer: L heel  Last BM:  02/27/2024  Height:   Ht Readings from Last 1 Encounters:  02/26/24 6' (1.829 m)    Weight:   Wt Readings from Last 1 Encounters:  02/29/24 (!) 137.4 kg    Ideal Body Weight:  80.9 kg  BMI:  Body mass index is 41.08 kg/m.  Estimated Nutritional Needs:   Kcal:  2200-2400  Protein:  120-135g  Fluid:  >/=2L   Taylor Hughes, RD Registered Dietitian  See Amion for more information

## 2024-02-29 NOTE — Progress Notes (Signed)
 [facility]  Date of Admission:  02/21/2024   Total days of inpatient antibiotics 8  Principal Problem:   Subacute osteomyelitis, left ankle and foot (HCC) Active Problems:   DKA (diabetic ketoacidosis) (HCC)   Acute on chronic anemia   Encephalopathy acute   Coffee ground emesis   Iron deficiency anemia   Cutaneous abscess of left foot   Erosive esophagitis   Duodenitis          Assessment: 53 year old male with history of prior chest wall abscess and sternoclavicular joint infection with MSSA and group A strep treated in 2020, bilateral feet osteomyelitis, right foot osteomyelitis status post TMA with polymicrobial cultures treated with 6 weeks of Augmentin  on discharge, diabetes mellitus admitted with DKA found to have   #Group B strep bacteremia secondary to left foot wound #Status post BKA on 7/18 - Patient presented with fusion, left foot ulcer suspected to be infected.  Started on broad-spectrum antibiotics now on ceftriaxone  -Blood culture admission 1/1 group B strep, repeat cultures no growth so far - MRI left foot showed possible abscess along calcaneus, possible early osteomyelitis cuboid bone. - TTE without vegetation.  Given wound has been present for about 3 months and patient is bacteremic will need TEE   #Poorly controlled diabetes - DKA - A1c 10.3 - Needs glycemic control for optimal wound healing Recommendations: - Continue amoxicillin  1 g 3 times daily to complete 6 weeks of antibiotics EOT 8/24 for presumptive endocarditis course. - EEG showed esophagitis/duodenitis, pt not candidate for TEE per cards Discussed PO abx plan with pt today an dhe is amenable -ID f/u on 8/19 - Standard precautions - ID will sign off  Microbiology:   Antibiotics: Amoxicillin    SUBJECTIVE:  Interval:  Afebrile overnight. Wbc 7.1k  Review of Systems: Review of Systems  All other systems reviewed and are negative.    Scheduled  Meds:  acetaminophen   1,000 mg Oral Q8H   acidophilus  2 capsule Oral TID   amoxicillin   1,000 mg Oral Q8H   atorvastatin   20 mg Oral QHS   Chlorhexidine  Gluconate Cloth  6 each Topical Q0600   ferrous sulfate   325 mg Oral Q breakfast   gabapentin   300 mg Oral TID   insulin  aspart  0-15 Units Subcutaneous TID WC   insulin  aspart  0-5 Units Subcutaneous QHS   insulin  aspart  4 Units Subcutaneous TID WC   insulin  glargine-yfgn  25 Units Subcutaneous QHS   metoprolol  tartrate  50 mg Oral BID   multivitamin with minerals  1 tablet Oral Daily   nutrition supplement (JUVEN)  1 packet Oral BID BM   pantoprazole   40 mg Oral Daily   sodium chloride  flush  3 mL Intravenous Q12H   Continuous Infusions: PRN Meds:.acetaminophen , dextrose , hydrALAZINE , metoprolol  tartrate, ondansetron  (ZOFRAN ) IV, mouth rinse, oxyCODONE , oxyCODONE , phenol, sodium chloride  flush No Known Allergies  OBJECTIVE: Vitals:   02/28/24 2122 02/29/24 0500 02/29/24 0602 02/29/24 0843  BP: 100/65  119/69 125/83  Pulse: 80  73 84  Resp:      Temp: 98.3 F (36.8 C)  98.6 F (37 C) 98.1 F (36.7 C)  TempSrc: Oral   Oral  SpO2: 98%  100% 100%  Weight:  (!) 137.4 kg    Height:       Body mass index is 41.08 kg/m.  Physical Exam Constitutional:      General: He is not in acute distress.  Appearance: He is normal weight. He is not toxic-appearing.  HENT:     Head: Normocephalic and atraumatic.     Right Ear: External ear normal.     Left Ear: External ear normal.     Nose: No congestion or rhinorrhea.     Mouth/Throat:     Mouth: Mucous membranes are moist.     Pharynx: Oropharynx is clear.  Eyes:     Extraocular Movements: Extraocular movements intact.     Conjunctiva/sclera: Conjunctivae normal.     Pupils: Pupils are equal, round, and reactive to light.  Cardiovascular:     Rate and Rhythm: Normal rate and regular rhythm.     Heart sounds: No murmur heard.    No friction rub. No gallop.  Pulmonary:      Effort: Pulmonary effort is normal.     Breath sounds: Normal breath sounds.  Abdominal:     General: Abdomen is flat. Bowel sounds are normal.     Palpations: Abdomen is soft.  Musculoskeletal:        General: No swelling.     Cervical back: Normal range of motion and neck supple.     Comments: AKA  Skin:    General: Skin is warm and dry.  Neurological:     General: No focal deficit present.     Mental Status: He is oriented to person, place, and time.  Psychiatric:        Mood and Affect: Mood normal.       Lab Results Lab Results  Component Value Date   WBC 7.1 02/28/2024   HGB 10.0 (L) 02/28/2024   HCT 33.0 (L) 02/28/2024   MCV 89.2 02/28/2024   PLT 366 02/28/2024    Lab Results  Component Value Date   CREATININE 0.91 02/29/2024   BUN 26 (H) 02/29/2024   NA 137 02/29/2024   K 3.6 02/29/2024   CL 105 02/29/2024   CO2 22 02/29/2024    Lab Results  Component Value Date   ALT 10 02/22/2024   AST 11 (L) 02/22/2024   ALKPHOS 85 02/22/2024   BILITOT 1.7 (H) 02/22/2024        Loney Stank, MD [facility] 02/29/2024, 1:07 PM Evaluation of this patient requires complex antimicrobial therapy evaluation and counseling + isolation needs for disease transmission risk assessment and mitigation

## 2024-02-29 NOTE — Progress Notes (Signed)
 Inpatient Rehab Admissions Coordinator:    CIR following at a distance. Per therapy, currently not tolerating well enough and does not have much support at home.   Leita Kleine, MS, CCC-SLP Rehab Admissions Coordinator  [phone] (celll) [phone] (office)

## 2024-03-01 ENCOUNTER — Ambulatory Visit: Admitting: Pharmacist

## 2024-03-01 DIAGNOSIS — M86272 Subacute osteomyelitis, left ankle and foot: Secondary | ICD-10-CM | POA: Diagnosis not present

## 2024-03-01 LAB — GLUCOSE, CAPILLARY
Glucose-Capillary: 261 mg/dL — ABNORMAL HIGH (ref 70–99)
Glucose-Capillary: 227 mg/dL — ABNORMAL HIGH (ref 70–99)
Glucose-Capillary: 195 mg/dL — ABNORMAL HIGH (ref 70–99)
Glucose-Capillary: 210 mg/dL — ABNORMAL HIGH (ref 70–99)

## 2024-03-01 LAB — SURGICAL PATHOLOGY

## 2024-03-01 MED ORDER — OXYCODONE HCL 15 MG PO TABS: 15.0000 mg | ORAL_TABLET | ORAL | 0 refills | Status: DC | PRN

## 2024-03-01 MED ORDER — ATORVASTATIN CALCIUM 20 MG PO TABS: 20.0000 mg | ORAL_TABLET | Freq: Every day | ORAL | Status: DC

## 2024-03-01 MED ORDER — ADULT MULTIVITAMIN W/MINERALS CH: 1.0000 | ORAL_TABLET | Freq: Every day | ORAL | Status: AC

## 2024-03-01 MED ORDER — FERROUS SULFATE 325 (65 FE) MG PO TABS: 325.0000 mg | ORAL_TABLET | Freq: Every day | ORAL | Status: DC

## 2024-03-01 MED ORDER — INSULIN GLARGINE-YFGN 100 UNIT/ML ~~LOC~~ SOLN: 25.0000 [IU] | Freq: Every day | SUBCUTANEOUS | Status: DC

## 2024-03-01 MED ORDER — PANTOPRAZOLE SODIUM 40 MG PO TBEC: 40.0000 mg | DELAYED_RELEASE_TABLET | Freq: Every day | ORAL | Status: DC

## 2024-03-01 MED ORDER — RISAQUAD PO CAPS: 2.0000 | ORAL_CAPSULE | Freq: Three times a day (TID) | ORAL | Status: AC

## 2024-03-01 MED ORDER — METOPROLOL TARTRATE 50 MG PO TABS: 50.0000 mg | ORAL_TABLET | Freq: Two times a day (BID) | ORAL | Status: DC

## 2024-03-01 MED ORDER — ACETAMINOPHEN 500 MG PO TABS: 1000.0000 mg | ORAL_TABLET | Freq: Three times a day (TID) | ORAL | Status: AC

## 2024-03-01 MED ORDER — INSULIN ASPART 100 UNIT/ML IJ SOLN: 4.0000 [IU] | Freq: Three times a day (TID) | INTRAMUSCULAR | Status: DC

## 2024-03-01 MED ORDER — GABAPENTIN 300 MG PO CAPS: 300.0000 mg | ORAL_CAPSULE | Freq: Three times a day (TID) | ORAL | 1 refills | Status: DC

## 2024-03-01 NOTE — Plan of Care (Signed)

## 2024-03-01 NOTE — Progress Notes (Signed)
 Inpatient Rehab Admissions Coordinator:    I met with pt. And request of nursing staff regarding potential CIR admit. Pt. Does not have help at home after CIR and myself, Pt. And PT who was in the room all agreed that Pt. Is likely unable to tolerate a program as intensive as CIR and would not be able to meet mod I goals to return home without assist after CIR. SNF appears to be a better fit for Pt. And I note that TOC is working on placement. I will not actively pursue CIR admit but if TOC is unable to place Pt. In SNF, CIR can re-assess candidacy.   Leita Kleine, MS, CCC-SLP Rehab Admissions Coordinator  [phone] (celll) [phone] (office)

## 2024-03-01 NOTE — PMR Pre-admission (Shared)
 PMR Admission Coordinator Pre-Admission Assessment  Patient: Taylor Hughes is an 53 y.o., male MRN: [id number] DOB: [birth year] Height: 6' (182.9 cm) Weight: (!) 137.4 kg              Insurance Information HMO:     PPO:      PCP:      IPA:      80/20:      OTHER:  PRIMARY:  [place] [employer]       Policy#: ***      Subscriber: *** CM Name: ***      Phone#: ***     Fax#: *** Pre-Cert#: ***      Employer: *** Benefits:  Phone #: ***     Name: *** Eff. Date: ***     Deduct: ***      Out of Pocket Max: ***      Life Max: ***  CIR: ***      SNF: *** Outpatient: ***     Co-Pay: *** Home Health: ***      Co-Pay: *** DME: ***     Co-Pay: *** Providers: *** SECONDARY:       Policy#:       Phone#:   Financial Counselor:       Phone#:   The "Data Collection Information Summary" for patients in Inpatient Rehabilitation Facilities with attached "Privacy Act Statement-Health Care Records" was provided and verbally reviewed with: N/A  Emergency Contact Information Contact Information     Name Relation Home Work Mobile   Craft,[name] Significant other   [phone]   Samin, Milke [phone]     Gil,anita Sister   [phone]      Other Contacts   None on File    Current Medical History  Patient Admitting Diagnosis: BKA History of Present Illness: Taylor Hughes is a 54 y.o. male  with past medical history of Rt transmet amputation, T2DM, peripheral neuropathy, Afib, HTN, GERD  who was admitted to [facility] on  02/21/24 with DKA, Lt foot wound, AKI. 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 He was seen by PT/OT post operatively and they recommended CIR to assist return to PLOF.    Glasgow Coma Scale Score: 15  Patient's medical record from [facility] has been reviewed by the rehabilitation admission coordinator and physician.  Past Medical History  Past Medical  History:  Diagnosis Date   Atrial fibrillation (HCC)    Diabetes mellitus without complication (HCC)    Type 2   GERD (gastroesophageal reflux disease)    Hyperlipidemia    Hypertension    Morbid (severe) obesity due to excess calories (HCC) 08/07/2023   bmi 44.63   Wears glasses    Wound, open, foot    left diabetic     Has the patient had major surgery during 100 days prior to admission? Yes  Family History  family history includes Colon cancer in his brother; Diabetes in his mother.   Current Medications   Current Facility-Administered Medications:    acetaminophen  (TYLENOL ) tablet 1,000 mg, 1,000 mg, Oral, Q8H, Samtani, Jai-Gurmukh, MD, 1,000 mg at 02/29/24 2209   acetaminophen  (TYLENOL ) tablet 325-650 mg, 325-650 mg, Oral, Q6H PRN, Gerome Maurilio HERO, PA-C, 650 mg at 02/29/24 1108   acidophilus (RISAQUAD) capsule 2 capsule, 2 capsule, Oral, TID, Shahmehdi, Seyed A, MD, 2 capsule at 03/01/24 0947   amoxicillin  (AMOXIL ) capsule 1,000 mg, 1,000 mg, Oral, Q8H,  Dennise Kingsley, MD, 1,000 mg at 03/01/24 0539   atorvastatin  (LIPITOR) tablet 20 mg, 20 mg, Oral, QHS, Collins, Emma M, PA-C, 20 mg at 02/29/24 2209   Chlorhexidine  Gluconate Cloth 2 % PADS 6 each, 6 each, Topical, Q0600, Gerome Maurilio HERO, PA-C, 6 each at 03/01/24 0535   dextrose  50 % solution 0-50 mL, 0-50 mL, Intravenous, PRN, Gerome Maurilio M, PA-C   ferrous sulfate  tablet 325 mg, 325 mg, Oral, Q breakfast, Gerome Maurilio M, PA-C, 325 mg at 03/01/24 9052   gabapentin  (NEURONTIN ) capsule 300 mg, 300 mg, Oral, TID, Samtani, Jai-Gurmukh, MD, 300 mg at 03/01/24 0947   hydrALAZINE  (APRESOLINE ) injection 5 mg, 5 mg, Intravenous, Q20 Min PRN, Gerome, Emma M, PA-C   insulin  aspart (novoLOG ) injection 0-15 Units, 0-15 Units, Subcutaneous, TID WC, Samtani, Jai-Gurmukh, MD, 5 Units at 03/01/24 1226   insulin  aspart (novoLOG ) injection 0-5 Units, 0-5 Units, Subcutaneous, QHS, Samtani, Jai-Gurmukh, MD, 3 Units at 02/28/24 2144   insulin   aspart (novoLOG ) injection 4 Units, 4 Units, Subcutaneous, TID WC, Samtani, Jai-Gurmukh, MD, 4 Units at 03/01/24 1226   insulin  glargine-yfgn (SEMGLEE ) injection 25 Units, 25 Units, Subcutaneous, QHS, Samtani, Jai-Gurmukh, MD, 25 Units at 02/29/24 2211   metoprolol  tartrate (LOPRESSOR ) injection 2.5-5 mg, 2.5-5 mg, Intravenous, Q3H PRN, Gerome, Emma M, PA-C, 5 mg at 02/22/24 1429   metoprolol  tartrate (LOPRESSOR ) tablet 50 mg, 50 mg, Oral, BID, Collins, Emma M, PA-C, 50 mg at 03/01/24 9051   multivitamin with minerals tablet 1 tablet, 1 tablet, Oral, Daily, Gerome Maurilio M, PA-C, 1 tablet at 03/01/24 9052   nutrition supplement (JUVEN) (JUVEN) powder packet 1 packet, 1 packet, Oral, BID BM, Gerome Maurilio HERO, PA-C, 1 packet at 03/01/24 9052   ondansetron  (ZOFRAN ) injection 4 mg, 4 mg, Intravenous, Q6H PRN, Gerome Maurilio M, PA-C, 4 mg at 02/25/24 1530   Oral care mouth rinse, 15 mL, Mouth Rinse, PRN, Gerome, Emma M, PA-C   oxyCODONE  (Oxy IR/ROXICODONE ) immediate release tablet 10 mg, 10 mg, Oral, Q4H PRN, Samtani, Jai-Gurmukh, MD, 10 mg at 02/28/24 0855   oxyCODONE  (Oxy IR/ROXICODONE ) immediate release tablet 15 mg, 15 mg, Oral, Q4H PRN, Samtani, Jai-Gurmukh, MD, 15 mg at 03/01/24 1039   pantoprazole  (PROTONIX ) EC tablet 40 mg, 40 mg, Oral, Daily, Gerome Maurilio M, PA-C, 40 mg at 03/01/24 0947   phenol (CHLORASEPTIC) mouth spray 1 spray, 1 spray, Mouth/Throat, PRN, Chavez, Abigail, NP, 1 spray at 02/27/24 0202   sodium chloride  flush (NS) 0.9 % injection 3 mL, 3 mL, Intravenous, Q12H, Collins, Emma M, PA-C, 3 mL at 03/01/24 9049   sodium chloride  flush (NS) 0.9 % injection 3 mL, 3 mL, Intravenous, PRN, Gerome Maurilio M, PA-C  Patients Current Diet:  Diet Order             Diet - low sodium heart healthy           Diet Carb Modified Fluid consistency: Thin; Room service appropriate? Yes  Diet effective now                   Precautions / Restrictions Precautions Precautions:  Fall Precaution/Restrictions Comments: LLE wound vac, RLE unna boot Other Brace: LLE limb guard Restrictions Weight Bearing Restrictions Per Provider Order: Yes LLE Weight Bearing Per Provider Order: Non weight bearing   Has the patient had 2 or more falls or a fall with injury in the past year?Yes  Prior Activity Level Community (5-7x/wk): Pt. went out for appts  Prior Functional Level Prior  Function Prior Level of Function : Independent/Modified Independent, Driving Mobility Comments: Ambulates with SPC. Denies fall history. ADLs Comments: Indep with ADLs/IADLs. On disability  Self Care: Did the patient need help bathing, dressing, using the toilet or eating?  Independent  Indoor Mobility: Did the patient need assistance with walking from room to room (with or without device)? Independent  Stairs: Did the patient need assistance with internal or external stairs (with or without device)? Independent  Functional Cognition: Did the patient need help planning regular tasks such as shopping or remembering to take medications? Independent  Patient Information Are you of Hispanic, Latino/a,or Spanish origin?: A. No, not of Hispanic, Latino/a, or Spanish origin What is your race?: B. Black or African American Do you need or want an interpreter to communicate with a doctor or health care staff?: 0. No  Patient's Response To:  Health Literacy and Transportation Is the patient able to respond to health literacy and transportation needs?: Yes Health Literacy - How often do you need to have someone help you when you read instructions, pamphlets, or other written material from your doctor or pharmacy?: Never In the past 12 months, has lack of transportation kept you from medical appointments or from getting medications?: No In the past 12 months, has lack of transportation kept you from meetings, work, or from getting things needed for daily living?: No  Home Assistive Devices /  Equipment Home Equipment: Agricultural consultant (2 wheels), [employer] - single point, Wheelchair - manual  Prior Device Use: Indicate devices/aids used by the patient prior to current illness, exacerbation or injury? None of the above  Current Functional Level Cognition  Orientation Level: Oriented X4    Extremity Assessment (includes Sensation/Coordination)  Upper Extremity Assessment: Overall WFL for tasks assessed  Lower Extremity Assessment: Defer to PT evaluation LLE Deficits / Details: Pt s/p BKA in stump protector. He demonstrated limited hip/knee ROM. Pt was able to complete a quad set. LLE: Unable to fully assess due to pain LLE Sensation: history of peripheral neuropathy LLE Coordination: decreased gross motor    ADLs  Overall ADL's : Needs assistance/impaired Eating/Feeding: Modified independent, Bed level Grooming: Set up, Sitting Upper Body Bathing: Set up, Sitting Lower Body Bathing: Maximal assistance, Sitting/lateral leans, Bed level Upper Body Dressing : Set up, Sitting Lower Body Dressing: Maximal assistance General ADL Comments: pt deferred OOB    Mobility  Overal bed mobility: Needs Assistance Bed Mobility: Supine to Sit, Sit to Supine Rolling: Min assist, Used rails Supine to sit: Contact guard, Used rails, HOB elevated Sit to supine: Min assist, Used rails, HOB elevated General bed mobility comments: Pt able to scoot to [facility] with bed rails, bed in trendelenburg position to assist.    Transfers  Overall transfer level: Needs assistance Equipment used: Rolling walker (2 wheels) Transfers: Sit to/from Stand Sit to Stand: From elevated surface, Max assist General transfer comment: patient declined OOB, friends visiting and described R leg locking with attempted stand with PT eariler.    Ambulation / Gait / Stairs / Wheelchair Mobility  Ambulation/Gait General Gait Details: unable    Posture / Balance Dynamic Sitting Balance Sitting balance - Comments: Pt sat EOB  with supervision. Balance Overall balance assessment: Needs assistance Sitting-balance support: Feet supported, Bilateral upper extremity supported Sitting balance-Leahy Scale: Fair Sitting balance - Comments: Pt sat EOB with supervision. Standing balance support: During functional activity, Bilateral upper extremity supported, Reliant on assistive device for balance Standing balance-Leahy Scale: Poor    Special needs/care consideration  Skin ***     Previous Home Environment (from acute therapy documentation) Living Arrangements: Spouse/significant other  Lives With: Spouse Available Help at Discharge: Family, Available PRN/intermittently Type of Home: Apartment Home Layout: One level Home Access: Level entry Bathroom Shower/Tub: Engineer, manufacturing systems: Standard Home Care Services: No  Discharge Living Setting Plans for Discharge Living Setting: Patient's home Type of Home at Discharge: House Discharge Home Layout: One level Discharge Home Access: Level entry Discharge Bathroom Shower/Tub: Tub/shower unit Discharge Bathroom Toilet: Standard Discharge Bathroom Accessibility: Yes How Accessible: Accessible via wheelchair, Other (comment)  Social/Family/Support Systems Contact Information: Pt. with limited support, wants mod I goals   Goals Patient/Family Goal for Rehab: PT/OT mod I Expected length of stay: 14-16 days Pt/Family Agrees to Admission and willing to participate: Yes Program Orientation Provided & Reviewed with Pt/Caregiver Including Roles  & Responsibilities: Yes   Decrease burden of Care through IP rehab admission: Not anticipated   Possible need for SNF placement upon discharge:Not anticipated   Patient Condition: {PATIENT'S CONDITION:22832}  Preadmission Screen Completed By:  Leita KATHEE Kleine, CCC-SLP, 03/01/2024 3:39 PM ______________________________________________________________________   Discussed status with Dr. PIERRETTEon***at *** and  received approval for admission today.  Admission Coordinator:  Leita KATHEE Kleine, time***/Date***

## 2024-03-01 NOTE — Discharge Summary (Signed)
 Physician Discharge Summary  Divonte Senger FMW:[id number] DOB: May 24, 1971 DOA: 02/21/2024  PCP: Delbert Clam, MD  Admit date: 02/21/2024 Discharge date: 03/01/2024  Time spent: 45 minutes  Recommendations for Outpatient Follow-up:  Needs Chem-12 CBC in about 1 week Insulin  long-acting as well as short-acting ordered this admission-titrate upward/add sliding scale in the outpatient setting at skilled facility Please change right-sided Unna boot weekly starting on 7/25 Requires outpatient follow-up with orthopedics Dr. Harden, wound [facility], shrinker weightbearing precautions as per him Completion of amoxicillin  8/24 as per below and outpatient follow-up with Dr. Dennise of [facility] Dr. 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 treated with 6 weeks of antibiotics/Augmentin    Last hospitalized 11/2023 for DKA, left foot infection 7/13 present [facility] ED lethargy metabolic encephalopathy secondary to likely DKA glucose >1000 K6.9 bicarb less than 7 pH 6.8   resuscitated aggressively in the ICU-gastroenterology evaluated secondary to emesis while in ICU hemoglobin remained stable in the 8 range although previously was 10 7/15 echocardiogram 55-60% no evidence of valvular vegetations--- ID discussed with cardiology and was not a candidate for TEE per cardiologist 7/16 transferred to  hospitalist service--- ID consulted and recommended de-escalation to 6 weeks of amoxicillin  through 8/24  7/17 duodenitis on EGD small amount of residue in stomach grade C erosive esophagitis which is likely source of coffee-ground 7/18 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-infected lower wound with group B strep bacteremia WBAT and stump shrinker in addition to wound VAC as per Ortho-will require skilled care per therapy eval--TOC aware awaiting skilled bed -scheduled Tylenol  1000 every 8 Can use Oxy IR 10 mg every 4 moderate and 15 mg Q for severe Hold Dilaudid  as caused itching   Uncontrolled diabetes mellitus with frequent DKA [Home doses 27 Lantus  daily]-A1c 10.9 Short acting 4 units 3 times daily meals, Increased to 25 units Semglee  insulin -will need addition of sliding scale insulin  at discharge at facility gabapentin  increased to 300 3 times daily and neuropathy is better Can use CGM on discharge from facility   Grade C erosive gastritis on EGD as above Underlying gastroparesis in 2024 H. pylori was negative-pathology showed reactive gastropathy and metaplasia consistent with peptic duodenitis Continuing Protonix  40 twice daily indefinitely until outpatient follow-up and EGD probably in 8 to 12 weeks Will discharge on oral iron given iron saturation of 7 Cc.Dr. Suzann gastroenterology to ensure follow-up is made   Paroxysmal A-fib-no arrhythmia seems to be in sinus and no  need for rate control Echo  this admit EF 55-60% Continues metoprolol  50 twice daily, controlled   AKI on admission from DKA physiology-improving continue fluids  Discharge Exam: Vitals:   03/01/24 0521 03/01/24 0819  BP: 127/77 127/65  Pulse: 77 95  Resp: 18 16  Temp: 97.7 F (36.5 C) 98.4 F (36.9 C)  SpO2: 100% 98%    Subj on day of d/c   Well Loose BM--not weatery  General Exam on discharge  EOMI NCAT no focal deficit no icterus no pallor S1-S2 no murmur Chest clear Abdomen soft no rebound Stump seems clean No lower extremity edema on either leg Unna boot noted  Discharge Instructions   Discharge Instructions     Diet - low sodium heart healthy   Complete by: As directed    Discharge wound care:   Complete by: As directed    02/29/24 0000    Negative Pressure Wound Therapy - Incisional      Comments: Attach the wound VAC dressing to a Prevena plus portable wound VAC pump at time of discharge.  02/29/24 0740    02/26/24 1604    Negative Pressure Wound Therapy - Incisional  Until discontinued       02/26/24 1603   02/22/24 0948    Wound care  See Comments      Comments: Cleanse wound to left lateral foot with VASHE (LAWSON # [id number]) and pat dry. Apply aquacel to open wound.  Cover with dry gauze.  Unna boot to lower legs to manage edema.   Change twice weekly.  02/22/24 0948   Increase activity slowly   Complete by: As directed    Negative Pressure Wound Therapy - Incisional   Complete by: As directed    Attach the wound VAC dressing to a Prevena plus portable wound VAC pump at time of discharge.      Allergies as of 03/01/2024   No Known Allergies      Medication List     TAKE these medications    acetaminophen  500 MG tablet Commonly known as: TYLENOL  Take 2 tablets (1,000 mg total) by mouth every 8 (eight) hours.   acidophilus Caps capsule Take 2 capsules by mouth 3 (three) times daily.   amoxicillin  500 MG capsule Commonly known as: AMOXIL  Take 2 capsules (1,000  mg total) by mouth every 8 (eight) hours.   atorvastatin  20 MG tablet Commonly known as: LIPITOR Take 1 tablet (20 mg total) by mouth at bedtime.   ferrous sulfate  325 (65 FE) MG tablet Take 1 tablet (325 mg total) by mouth daily with breakfast. Start taking on: March 02, 2024   FreeStyle Libre 3 [employer] Use as directed .Place 1 sensor on the skin every 14 days. Use to check glucose continuously   gabapentin  300 MG capsule Commonly known as: NEURONTIN  Take 1 capsule (300 mg total) by mouth 3 (three) times daily. What changed: when to take this   insulin  aspart 100 UNIT/ML injection Commonly known as: novoLOG  Inject 4 Units into the skin 3 (three) times daily with meals.   insulin  glargine-yfgn 100 UNIT/ML injection Commonly known as: SEMGLEE  Inject 0.25 mLs (25 Units total) into the skin at bedtime.   metoprolol  tartrate 50 MG tablet Commonly known as: LOPRESSOR  Take 1 tablet (50 mg total) by mouth 2 (two) times daily.   multivitamin with minerals Tabs tablet Take 1 tablet by mouth daily. Start taking on: March 02, 2024   oxyCODONE  15 MG immediate release tablet Commonly  known as: ROXICODONE  Take 1 tablet (15 mg total) by mouth every 4 (four) hours as needed for severe pain (pain score 7-10).   pantoprazole  40 MG tablet Commonly known as: PROTONIX  Take 1 tablet (40 mg total) by mouth daily. Start taking on: March 02, 2024               Durable Medical Equipment  (From admission, onward)           Start     Ordered   02/25/24 1748  For home use only DME 4 wheeled rolling walker with seat  Once       Comments: Bariatric walker  Question:  Patient needs a walker to treat with the following condition  Answer:  Weakness   02/25/24 1748              Discharge Care Instructions  (From admission, onward)           Start     Ordered   03/01/24 0000  Discharge wound care:       Comments: 02/29/24 0000    Negative Pressure Wound Therapy -  Incisional      Comments: Attach the wound VAC dressing to a Prevena plus portable wound VAC pump at time of discharge.  02/29/24 0740    02/26/24 1604    Negative Pressure Wound Therapy - Incisional  Until discontinued       02/26/24 1603   02/22/24 0948    Wound care  See Comments      Comments: Cleanse wound to left lateral foot with VASHE (LAWSON # [id number]) and pat dry. Apply aquacel to open wound.  Cover with dry gauze.  Unna boot to lower legs to manage edema.   Change twice weekly.  02/22/24 0948   03/01/24 0959           No Known Allergies  Follow-up Information     Harden Jerona GAILS, MD Follow up in 1 week(s).   Specialty: Orthopedic Surgery Contact information: [address] [place]  [place] [address] [phone]                  The results of significant diagnostics from this hospitalization (including imaging, microbiology, ancillary and laboratory) are listed below for reference.    Significant Diagnostic Studies: MR FOOT LEFT WO CONTRAST Result Date: 02/23/2024 CLINICAL DATA:  History of diabetes and plantar foot wound. Concern for soft tissue infection and osteomyelitis. EXAM: MRI OF THE LEFT FOOT WITHOUT CONTRAST TECHNIQUE: Multiplanar, multisequence MR imaging of the left hindfoot was performed. No intravenous contrast was administered. COMPARISON:  Radiographs 02/21/2024 and 07/30/2018. MRI 11/11/2023 and 08/02/2018. FINDINGS: Technical note: Despite efforts by the technologist and patient, mild motion artifact is present on today's exam and could not be eliminated. This reduces exam sensitivity and specificity. Patient was unable to complete the examination. No post-contrast imaging obtained. Bones/Joint/Cartilage Chronic plantar midfoot soft tissue ulceration appears progressive compared with the most recent MRI. Associated underlying soft tissue findings are further described below. There is new mild T2 hyperintensity within the cuboid without definite T1  marrow signal abnormality or cortical destruction. 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 The distal tibia, distal fibula, talus and calcaneus are intact. No significant hindfoot joint effusions. Ligaments Chronic attenuation of the anterior talofibular ligament, similar to previous MRI. No acute ligamentous findings identified. Muscles and Tendons Grossly stable mild distal Achilles tendinosis without tear. The medial flexor and anterior extensor tendons appear  normal. Mild flattening of the peroneus brevis tendon at the level of the lateral malleolus with heterogeneous fluid surrounding the tendon more distally at the level of the cuboid, further described below. Mild generalized muscular atrophy and edema. Soft tissues As above, chronic soft tissue ulceration along the plantar aspect of the midfoot which appears increased from previous MRI of 3 months ago. Lateral to the calcaneus, there is a new heterogeneous fluid collection which partially encases the distal peroneus brevis tendon and measures approximately 3.2 x 2.5 x 3.8 cm. No other focal fluid collections are identified. There is generalized soft tissue edema throughout the hindfoot. IMPRESSION: 1. Chronic soft tissue ulceration along the plantar aspect of the midfoot which appears progressive compared with previous MRI of 3 months ago. 2. New heterogeneous fluid collection lateral to the calcaneus which partially encases the distal peroneus brevis tendon, suspicious for an abscess or infected tenosynovitis. 3. New mild T2 hyperintensity within the cuboid without definite T1 marrow signal abnormality or cortical destruction, equivocal for early osteomyelitis versus reactive marrow edema. 4. 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 5. Generalized soft tissue edema throughout the hindfoot. Electronically Signed   By: Elsie Perone M.D.   On: 02/23/2024 14:52    ECHOCARDIOGRAM COMPLETE Result Date: 02/23/2024    ECHOCARDIOGRAM REPORT   Patient Name:   ELPIDIO THIELEN Date of Exam: 02/23/2024 Medical Rec #:  [id number]    Height:       72.0 in Accession #:    [id number]   Weight:       298.1 lb Date of Birth:  05/26/1971     BSA:          2.524 m Patient Age:    53 years     BP:           149/87 mmHg Patient Gender: M            HR:           100 bpm. Exam Location:  Inpatient Procedure: 2D Echo, Cardiac Doppler, Color Doppler and Intracardiac            Opacification Agent (Both Spectral and Color Flow Doppler were            utilized during procedure). Indications:    Endocarditis  History:        Patient has prior history of Echocardiogram examinations. GERD,                 Arrythmias:Atrial Fibrillation; Risk Factors:Hypertension and                 Diabetes.  Sonographer:    Christiana Mbomeh Referring Phys: [id number] PRAVEEN MANNAM IMPRESSIONS  1. Left ventricular ejection fraction, by estimation, is 55 to 60%. The left ventricle has normal function. The left ventricle has no regional wall motion abnormalities. Left ventricular diastolic parameters were normal.  2. Right ventricular systolic function is normal. The right ventricular size is normal. There is mildly elevated pulmonary artery systolic pressure. The estimated right ventricular systolic pressure is 39.8 mmHg.  3. The mitral valve is normal in structure. No evidence of mitral valve regurgitation. No evidence of mitral stenosis.  4. The aortic valve is tricuspid. Aortic valve regurgitation is not visualized. No aortic stenosis is present.  5. The inferior vena cava is dilated in size with <50% respiratory variability, suggesting right atrial pressure of 15 mmHg. Conclusion(s)/Recommendation(s): No evidence of valvular vegetations on this transthoracic echocardiogram. Consider  a transesophageal echocardiogram to exclude infective endocarditis if clinically indicated. FINDINGS  Left Ventricle: Left ventricular  ejection fraction, by estimation, is 55 to 60%. The left ventricle has normal function. The left ventricle has no regional wall motion abnormalities. Definity  contrast agent was given IV to delineate the left ventricular  endocardial borders. The left ventricular internal cavity size was normal in size. There is no left ventricular hypertrophy. Left ventricular diastolic parameters were normal. Normal left ventricular filling pressure. Right Ventricle: The right ventricular size is normal. No increase in right ventricular wall thickness. Right ventricular systolic function is normal. There is mildly elevated pulmonary artery systolic pressure. The tricuspid regurgitant velocity is 2.49  m/s, and with an assumed right atrial pressure of 15 mmHg, the estimated right ventricular systolic pressure is 39.8 mmHg. Left Atrium: Left atrial size was normal in size. Right Atrium: Right atrial size was normal in size. Pericardium: There is no evidence of pericardial effusion. Mitral Valve: The mitral valve is normal in structure. No evidence of mitral valve regurgitation. No evidence of mitral valve stenosis. Tricuspid Valve: The tricuspid valve is normal in structure. Tricuspid valve regurgitation is not demonstrated. No evidence of tricuspid stenosis. Aortic Valve: The aortic valve is tricuspid. Aortic valve regurgitation is not visualized. No aortic stenosis is present. Aortic valve mean gradient measures 5.0 mmHg. Aortic valve peak gradient measures 10.8 mmHg. Aortic valve area, by VTI measures 4.15  cm. Pulmonic Valve: The pulmonic valve was not well visualized. Pulmonic valve regurgitation is not visualized. No evidence of pulmonic stenosis. Aorta: The aortic root and ascending aorta are structurally normal, with no evidence of dilitation. Venous: The inferior vena cava is dilated in size with less than 50% respiratory variability, suggesting right atrial pressure of 15 mmHg. IAS/Shunts: No atrial level shunt detected  by color flow Doppler.  LEFT VENTRICLE PLAX 2D LVIDd:         5.00 cm   Diastology LVIDs:         3.30 cm   LV e' medial:    12.30 cm/s LV PW:         1.00 cm   LV E/e' medial:  7.9 LV IVS:        1.10 cm   LV e' lateral:   12.10 cm/s LVOT diam:     2.30 cm   LV E/e' lateral: 8.0 LV SV:         111 LV SV Index:   44 LVOT Area:     4.15 cm  RIGHT VENTRICLE             IVC RV S prime:     14.70 cm/s  IVC diam: 2.30 cm TAPSE (M-mode): 2.4 cm LEFT ATRIUM             Index        RIGHT ATRIUM           Index LA Vol (A2C):   62.0 ml 24.56 ml/m  RA Area:     16.70 cm LA Vol (A4C):   73.2 ml 29.00 ml/m  RA Volume:   41.20 ml  16.32 ml/m LA Biplane Vol: 68.9 ml 27.29 ml/m  AORTIC VALVE AV Area (Vmax):    2.74 cm AV Area (Vmean):   2.77 cm AV Area (VTI):     4.15 cm AV Vmax:           164.00 cm/s AV Vmean:          108.000 cm/s AV VTI:  0.267 m AV Peak Grad:      10.8 mmHg AV Mean Grad:      5.0 mmHg LVOT Vmax:         108.00 cm/s LVOT Vmean:        72.000 cm/s LVOT VTI:          0.267 m LVOT/AV VTI ratio: 1.00  AORTA Ao Root diam: 2.80 cm Ao Asc diam:  2.90 cm MITRAL VALVE               TRICUSPID VALVE MV Area (PHT): 5.66 cm    TR Peak grad:   24.8 mmHg MV Decel Time: 134 msec    TR Vmax:        249.00 cm/s MV E velocity: 96.90 cm/s MV A velocity: 99.20 cm/s  SHUNTS MV E/A ratio:  0.98        Systemic VTI:  0.27 m                            Systemic Diam: 2.30 cm Jerel Croitoru MD Electronically signed by Jerel Balding MD Signature Date/Time: 02/23/2024/2:04:17 PM    Final    CT Head Wo Contrast Result Date: 02/22/2024 CLINICAL DATA:  Headache, increasing frequency or severity EXAM: CT HEAD WITHOUT CONTRAST TECHNIQUE: Contiguous axial images were obtained from the base of the skull through the vertex without intravenous contrast. RADIATION DOSE REDUCTION: This exam was performed according to the departmental dose-optimization program which includes automated exposure control, adjustment of the mA  and/or kV according to patient size and/or use of iterative reconstruction technique. COMPARISON:  None Available. FINDINGS: Brain: Normal anatomic configuration. No abnormal intra or extra-axial mass lesion or fluid collection. No abnormal mass effect or midline shift. No evidence of acute intracranial hemorrhage or infarct. Ventricular size is normal. Cerebellum unremarkable. Vascular: Unremarkable Skull: Intact Sinuses/Orbits: Paranasal sinuses are clear. Orbits are unremarkable. Other: Mastoid air cells and middle ear cavities are clear. IMPRESSION: 1. Normal CT examination of the head. Electronically Signed   By: Dorethia Molt M.D.   On: 02/22/2024 00:08   DG Foot Complete Left Result Date: 02/21/2024 CLINICAL DATA:  Questionable sepsis EXAM: LEFT FOOT - COMPLETE 3+ VIEW COMPARISON:  MRI of the left foot 11/11/2023. FINDINGS: Fifth ray is absent. The mid and distal third phalanx are absent, a new finding. There is irregular cortical thickening along the lateral margin of the mid and proximal fourth and third metatarsals. There is no acute fracture or dislocation identified. There is diffuse soft tissue swelling of the foot. There is a plantar ulceration overlying the midfoot. Charcot joint arthropathy noted of the midfoot, similar to prior MRI. IMPRESSION: 1. The mid and distal third phalanx are absent, a new finding. 2. Irregular cortical thickening along the lateral margin of the mid and proximal fourth and third metatarsals. Findings are concerning for osteomyelitis. 3. Diffuse soft tissue swelling of the foot. 4. Plantar ulceration overlying the midfoot. 5. Charcot joint arthropathy of the midfoot, similar to prior MRI. Electronically Signed   By: Greig Pique M.D.   On: 02/21/2024 21:45   DG Chest Port 1 View Result Date: 02/21/2024 CLINICAL DATA:  Questionable sepsis EXAM: PORTABLE CHEST 1 VIEW COMPARISON:  Chest x-ray 11/10/2023 FINDINGS: There are increased interstitial markings centrally in  both lungs. Costophrenic angles are clear. No pneumothorax. The heart is mildly enlarged, unchanged. No acute fractures are identified. IMPRESSION: Increased interstitial markings centrally in both lungs, which may  represent pulmonary edema or atypical infection. Electronically Signed   By: Greig Pique M.D.   On: 02/21/2024 21:42    Microbiology: Recent Results (from the past 240 hours)  Resp panel by RT-PCR (RSV, Flu A&B, Covid) Anterior Nasal Swab     Status: None   Collection Time: 02/21/24  9:03 PM   Specimen: Anterior Nasal Swab  Result Value Ref Range Status   SARS Coronavirus 2 by RT PCR NEGATIVE NEGATIVE Final   Influenza A by PCR NEGATIVE NEGATIVE Final   Influenza B by PCR NEGATIVE NEGATIVE Final    Comment: (NOTE) The Xpert Xpress SARS-CoV-2/FLU/RSV plus assay is intended as an aid in the diagnosis of influenza from Nasopharyngeal swab specimens and should not be used as a sole basis for treatment. Nasal washings and aspirates are unacceptable for Xpert Xpress SARS-CoV-2/FLU/RSV testing.  Fact Sheet for Patients: BloggerCourse.com  Fact Sheet for Healthcare Providers: SeriousBroker.it  This test is not yet approved or cleared by the [place]  FDA and has been authorized for detection and/or diagnosis of SARS-CoV-2 by FDA under an Emergency Use Authorization (EUA). This EUA will remain in effect (meaning this test can be used) for the duration of the COVID-19 declaration under Section 564(b)(1) of the Act, 21 U.S.C. section 360bbb-3(b)(1), unless the authorization is terminated or revoked.     Resp Syncytial Virus by PCR NEGATIVE NEGATIVE Final    Comment: (NOTE) Fact Sheet for Patients: BloggerCourse.com  Fact Sheet for Healthcare Providers: SeriousBroker.it  This test is not yet approved or cleared by the [place]  FDA and has been authorized for detection  and/or diagnosis of SARS-CoV-2 by FDA under an Emergency Use Authorization (EUA). This EUA will remain in effect (meaning this test can be used) for the duration of the COVID-19 declaration under Section 564(b)(1) of the Act, 21 U.S.C. section 360bbb-3(b)(1), unless the authorization is terminated or revoked.  Performed at [facility], [address] [place], [place] [address]   Blood Culture (routine x 2)     Status: Abnormal   Collection Time: 02/21/24  9:03 PM   Specimen: BLOOD  Result Value Ref Range Status   Specimen Description BLOOD BLOOD RIGHT HAND  Final   Special Requests   Final    BOTTLES DRAWN AEROBIC AND ANAEROBIC Blood Culture results may not be optimal due to an inadequate volume of blood received in culture bottles   Culture  Setup Time   Final    GRAM POSITIVE COCCI IN CHAINS IN BOTH AEROBIC AND ANAEROBIC BOTTLES CRITICAL RESULT CALLED TO, READ BACK BY AND VERIFIED WITH: PHARMD EMILY S ON [id number] @1056  BY SM Performed at [facility], [address] [place], [place] [address]    Culture GROUP B STREP(S.AGALACTIAE)ISOLATED (A)  Final   Report Status 02/24/2024 FINAL  Final   Organism ID, Bacteria GROUP B STREP(S.AGALACTIAE)ISOLATED  Final      Susceptibility   Group b strep(s.agalactiae)isolated - MIC*    CLINDAMYCIN  >=1 RESISTANT Resistant     AMPICILLIN  <=0.25 SENSITIVE Sensitive     ERYTHROMYCIN >=8 RESISTANT Resistant     VANCOMYCIN  1 SENSITIVE Sensitive     CEFTRIAXONE  <=0.12 SENSITIVE Sensitive     LEVOFLOXACIN 0.5 SENSITIVE Sensitive     PENICILLIN  <=0.06 SENSITIVE Sensitive     * GROUP B STREP(S.AGALACTIAE)ISOLATED  Blood Culture ID Panel (Reflexed)     Status: Abnormal   Collection Time: 02/21/24  9:03 PM  Result Value Ref Range Status   Enterococcus faecalis  NOT DETECTED NOT DETECTED Final   Enterococcus Faecium NOT DETECTED NOT DETECTED Final   Listeria monocytogenes NOT DETECTED NOT DETECTED Final   Staphylococcus species NOT  DETECTED NOT DETECTED Final   Staphylococcus aureus (BCID) NOT DETECTED NOT DETECTED Final   Staphylococcus epidermidis NOT DETECTED NOT DETECTED Final   Staphylococcus lugdunensis NOT DETECTED NOT DETECTED Final   Streptococcus species DETECTED (A) NOT DETECTED Final    Comment: CRITICAL RESULT CALLED TO, READ BACK BY AND VERIFIED WITH: PHARMD EMILY S ON [id number] @1056  BY SM    Streptococcus agalactiae DETECTED (A) NOT DETECTED Final    Comment: CRITICAL RESULT CALLED TO, READ BACK BY AND VERIFIED WITH: PHARMD EMILY S ON [id number] @1056  BY SM    Streptococcus pneumoniae NOT DETECTED NOT DETECTED Final   Streptococcus pyogenes NOT DETECTED NOT DETECTED Final   A.calcoaceticus-baumannii NOT DETECTED NOT DETECTED Final   Bacteroides fragilis NOT DETECTED NOT DETECTED Final   Enterobacterales NOT DETECTED NOT DETECTED Final   Enterobacter cloacae complex NOT DETECTED NOT DETECTED Final   Escherichia coli NOT DETECTED NOT DETECTED Final   Klebsiella aerogenes NOT DETECTED NOT DETECTED Final   Klebsiella oxytoca NOT DETECTED NOT DETECTED Final   Klebsiella pneumoniae NOT DETECTED NOT DETECTED Final   Proteus species NOT DETECTED NOT DETECTED Final   Salmonella species NOT DETECTED NOT DETECTED Final   Serratia marcescens NOT DETECTED NOT DETECTED Final   Haemophilus influenzae NOT DETECTED NOT DETECTED Final   Neisseria meningitidis NOT DETECTED NOT DETECTED Final   Pseudomonas aeruginosa NOT DETECTED NOT DETECTED Final   Stenotrophomonas maltophilia NOT DETECTED NOT DETECTED Final   Candida albicans NOT DETECTED NOT DETECTED Final   Candida auris NOT DETECTED NOT DETECTED Final   Candida glabrata NOT DETECTED NOT DETECTED Final   Candida krusei NOT DETECTED NOT DETECTED Final   Candida parapsilosis NOT DETECTED NOT DETECTED Final   Candida tropicalis NOT DETECTED NOT DETECTED Final   Cryptococcus neoformans/gattii NOT DETECTED NOT DETECTED Final    Comment: Performed at [facility]  Lab, [address] [place], [place] [address]  MRSA Next Gen by PCR, Nasal     Status: Abnormal   Collection Time: 02/21/24 11:25 PM   Specimen: Nasal Mucosa; Nasal Swab  Result Value Ref Range Status   MRSA by PCR Next Gen DETECTED (A) NOT DETECTED Final    Comment: RESULT CALLED TO, READ BACK BY AND VERIFIED WITH: WILLIAMSON RN 02/22/2024 @ 0205 BY AB (NOTE) The GeneXpert MRSA Assay (FDA approved for NASAL specimens only), is one component of a comprehensive MRSA colonization surveillance program. It is not intended to diagnose MRSA infection nor to guide or monitor treatment for MRSA infections. Test performance is not FDA approved in patients less than 22 years old. Performed at [facility] Lab, [address] [place], [place] [address]   Culture, blood (Routine X 2) w Reflex to ID Panel     Status: None   Collection Time: 02/22/24 10:30 AM   Specimen: BLOOD RIGHT HAND  Result Value Ref Range Status   Specimen Description BLOOD RIGHT HAND  Final   Special Requests   Final    BOTTLES DRAWN AEROBIC AND ANAEROBIC Blood Culture adequate volume   Culture   Final    NO GROWTH 5 DAYS Performed at [facility] Lab, [address], [place], [place] [address]    Report Status 02/27/2024 FINAL  Final  Culture, blood (Routine X 2) w Reflex to ID Panel  Status: None   Collection Time: 02/22/24 10:30 AM   Specimen: BLOOD RIGHT HAND  Result Value Ref Range Status   Specimen Description BLOOD RIGHT HAND  Final   Special Requests   Final    BOTTLES DRAWN AEROBIC ONLY Blood Culture adequate volume   Culture   Final    NO GROWTH 5 DAYS Performed at [facility] Lab, [address] [place], [place] [address]    Report Status 02/27/2024 FINAL  Final     Labs: Basic Metabolic Panel: Recent Labs  Lab 02/24/24 1755 02/25/24 0625 02/26/24 0757 02/27/24 0400 02/28/24 0740 02/29/24 0650  NA  --  144 141 137 139 137  K  --  3.4* 4.2 4.6 3.8 3.6  CL  --  111 112* 107 107 105   CO2  --  23 15* 21* 21* 22  GLUCOSE  --  213* 281* 369* 230* 249*  BUN  --  21* 17 21* 30* 26*  CREATININE  --  1.04 0.91 1.07 1.22 0.91  CALCIUM   --  9.0 8.5* 8.2* 8.3* 8.1*  PHOS 1.2* 1.5* 2.1*  --   --   --    Liver Function Tests: No results for input(s): AST, ALT, ALKPHOS, BILITOT, PROT, ALBUMIN  in the last 168 hours. No results for input(s): LIPASE, AMYLASE in the last 168 hours. No results for input(s): AMMONIA in the last 168 hours. CBC: Recent Labs  Lab 02/24/24 0555 02/25/24 0625 02/26/24 1640 02/27/24 0400 02/28/24 0740  WBC 16.4* 11.1* 9.2 10.4 7.1  HGB 8.6* 8.9* 8.7* 9.6* 10.0*  HCT 26.0* 27.6* 28.6* 30.8* 33.0*  MCV 81.8 84.1 87.2 85.8 89.2  PLT 333 334 331 344 366   Cardiac Enzymes: No results for input(s): CKTOTAL, CKMB, CKMBINDEX, TROPONINI in the last 168 hours. BNP: BNP (last 3 results) Recent Labs    03/29/23 0222 11/10/23 1922 11/13/23 1117  BNP 246.2* 585.6* 242.0*    ProBNP (last 3 results) No results for input(s): PROBNP in the last 8760 hours.  CBG: Recent Labs  Lab 02/29/24 0844 02/29/24 1239 02/29/24 1620 02/29/24 2003 03/01/24 0816  GLUCAP 284* 210* 138* 183* 261*    Signed:  Colen Grimes MD   [facility] 03/01/2024, 9:59 AM

## 2024-03-01 NOTE — Progress Notes (Signed)
 Occupational Therapy Treatment Patient Details Name: Taylor Hughes MRN: [id number] DOB: 15-May-1971 Today's Date: 03/01/2024   History of present illness Lazlo Tunney is a 53 y.o. male admitted 02/21/24 with DKA, Lt foot wound, AKI. 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 PMHx: Rt transmet amputation, T2DM, peripheral neuropathy, Afib, HTN, GERD.   OT comments  Patient with fair progress toward patient focused, listed deficits continue to impact independence.  Patient describing his R leg locking earlier with PT, so agreed to dangle EOB for seated grooming, but declined OOB to the recliner.  OT to continue efforts in the acute setting to address deficits, and Patient will benefit from continued inpatient follow up therapy, <3 hours/day.        If plan is discharge home, recommend the following:  Two people to help with walking and/or transfers;A lot of help with bathing/dressing/bathroom;Assistance with cooking/housework;Help with stairs or ramp for entrance;Assist for transportation   Equipment Recommendations  Wheelchair cushion (measurements OT);Wheelchair (measurements OT)    Recommendations for Other Services      Precautions / Restrictions Precautions Precautions: Fall Recall of Precautions/Restrictions: Impaired Precaution/Restrictions Comments: LLE wound vac, RLE unna boot Required Braces or Orthoses: Other Brace Other Brace: LLE limb guard Restrictions Weight Bearing Restrictions Per Provider Order: Yes LLE Weight Bearing Per Provider Order: Non weight bearing       Mobility Bed Mobility Overal bed mobility: Needs Assistance Bed Mobility: Supine to Sit, Sit to Supine     Supine to sit: Contact guard, Used rails, HOB elevated Sit to supine: Min assist, Used rails, HOB elevated        Transfers                   General transfer comment: patient declined OOB, friends visiting and  described R leg locking with attempted stand with PT eariler.     Balance Overall balance assessment: Needs assistance Sitting-balance support: Feet supported, Bilateral upper extremity supported Sitting balance-Leahy Scale: Fair                                     ADL either performed or assessed with clinical judgement   ADL       Grooming: Set up;Sitting           Upper Body Dressing : Set up;Sitting   Lower Body Dressing: Maximal assistance                      Extremity/Trunk Assessment Upper Extremity Assessment Upper Extremity Assessment: Overall WFL for tasks assessed   Lower Extremity Assessment Lower Extremity Assessment: Defer to PT evaluation        Vision Patient Visual Report: No change from baseline     Perception Perception Perception: Not tested   Praxis Praxis Praxis: Not tested   Communication Communication Communication: No apparent difficulties   Cognition Arousal: Alert Behavior During Therapy: WFL for tasks assessed/performed Cognition: No apparent impairments                               Following commands: Intact        Cueing   Cueing Techniques: Verbal cues, Gestural cues  Exercises      Shoulder Instructions       General Comments  Pertinent Vitals/ Pain       Pain Assessment Pain Assessment: Faces Faces Pain Scale: Hurts little more Pain Location: LLE with movement and RLE with weight bearing Pain Descriptors / Indicators: Tightness Pain Intervention(s): Monitored during session                                                          Frequency  Min 2X/week        Progress Toward Goals  OT Goals(current goals can now be found in the care plan section)  Progress towards OT goals: Progressing toward goals  Acute Rehab OT Goals Patient Stated Goal: Move better and not be in pain OT Goal Formulation: With patient Time For Goal  Achievement: 03/12/24 Potential to Achieve Goals: Good  Plan      Co-evaluation                 AM-PAC OT 6 Clicks Daily Activity     Outcome Measure   Help from another person eating meals?: None Help from another person taking care of personal grooming?: A Little Help from another person toileting, which includes using toliet, bedpan, or urinal?: A Lot Help from another person bathing (including washing, rinsing, drying)?: A Lot Help from another person to put on and taking off regular upper body clothing?: A Little Help from another person to put on and taking off regular lower body clothing?: A Lot 6 Click Score: 16    End of Session    OT Visit Diagnosis: Unsteadiness on feet (R26.81);Muscle weakness (generalized) (M62.81);Pain Pain - Right/Left: Left Pain - part of body: Leg   Activity Tolerance Patient tolerated treatment well   Patient Left in bed;with call bell/phone within reach;with bed alarm set   Nurse Communication Mobility status        Time: 1355-1413 OT Time Calculation (min): 18 min  Charges: OT General Charges $OT Visit: 1 Visit OT Treatments $Self Care/Home Management : 8-22 mins  03/01/2024  RP, OTR/L  Acute Rehabilitation Services  Office:  [phone]   Taylor Hughes 03/01/2024, 2:24 PM

## 2024-03-01 NOTE — Progress Notes (Signed)
 Physical Therapy Treatment Patient Details Name: Taylor Hughes MRN: [id number] DOB: Jun 27, 1971 Today's Date: 03/01/2024   History of Present Illness Taylor Hughes is a 53 y.o. male admitted 02/21/24 with DKA, Lt foot wound, AKI. 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 PMHx: Rt transmet amputation, T2DM, peripheral neuropathy, Afib, HTN, GERD.    PT Comments  Pt resting in bed on arrival, agreeable to session and giving good effort for transfer training. Pt limited by increased RLE pain and spasms with standing attempts this session with pt unable to come to standing with x3 attempts. Pt able to laterally scoot along EOB to [facility] with mod A. Reviewed supine HEP exercises with pt verbalizing and demonstrating understanding of all. Patient will benefit from continued inpatient follow up therapy, <3 hours/day. Pt continues to benefit from skilled PT services to progress toward functional mobility goals.     If plan is discharge home, recommend the following: Two people to help with walking and/or transfers;A lot of help with bathing/dressing/bathroom;Assistance with cooking/housework;Assist for transportation;Help with stairs or ramp for entrance   Can travel by private vehicle     No  Equipment Recommendations  Wheelchair (measurements PT);BSC/3in1;Hoyer lift;Wheelchair cushion (measurements PT);Hospital bed    Recommendations for Other Services       Precautions / Restrictions Precautions Precautions: Fall Recall of Precautions/Restrictions: Impaired Precaution/Restrictions Comments: LLE wound vac, RLE unna boot Required Braces or Orthoses: Other Brace Other Brace: LLE limb guard Restrictions Weight Bearing Restrictions Per Provider Order: No     Mobility  Bed Mobility Overal bed mobility: Needs Assistance Bed Mobility: Supine to Sit, Sit to Supine     Supine to sit: Supervision, HOB elevated, Used rails  (extra time to exit L side of bed) Sit to supine: Min assist (elevate BLE onto bed)   General bed mobility comments: Pt able to scoot to [facility] with bed rails, bed in trendelenburg position to assist.    Transfers Overall transfer level: Needs assistance Equipment used: Rolling walker (2 wheels) Transfers: Sit to/from Stand Sit to Stand: From elevated surface, Max assist           General transfer comment: sit>stand from elevated EOB with cuing re: hand placement, 3 attempts with pt unable to elevate hips from EOB this session    Ambulation/Gait               General Gait Details: unable   Stairs             Wheelchair Mobility     Tilt Bed    Modified Rankin (Stroke Patients Only)       Balance Overall balance assessment: Needs assistance Sitting-balance support: Feet supported, Bilateral upper extremity supported Sitting balance-Leahy Scale: Fair Sitting balance - Comments: Pt sat EOB with supervision.                                    Communication Communication Communication: No apparent difficulties  Cognition Arousal: Alert Behavior During Therapy: Anxious, WFL for tasks assessed/performed   PT - Cognitive impairments: No apparent impairments                         Following commands: Intact      Cueing Cueing Techniques: Verbal cues, Gestural cues  Exercises Amputee Exercises Quad Sets: Supine, Left, AROM, 10 reps (with 10  second hold) Gluteal Sets: 10 reps Straight Leg Raises: AROM, Left, 5 reps, Supine    General Comments        Pertinent Vitals/Pain Pain Assessment Pain Assessment: Faces Faces Pain Scale: Hurts little more Pain Location: LLE with touch, movement, dependent position Pain Descriptors / Indicators: Discomfort, Grimacing Pain Intervention(s): Monitored during session, Limited activity within patient's tolerance    Home Living                          Prior Function             PT Goals (current goals can now be found in the care plan section) Acute Rehab PT Goals Patient Stated Goal: Go to rehab and get stronger before returning home PT Goal Formulation: With patient Time For Goal Achievement: 03/12/24 Progress towards PT goals: Progressing toward goals    Frequency    Min 2X/week      PT Plan      Co-evaluation              AM-PAC PT 6 Clicks Mobility   Outcome Measure  Help needed turning from your back to your side while in a flat bed without using bedrails?: A Little Help needed moving from lying on your back to sitting on the side of a flat bed without using bedrails?: A Little Help needed moving to and from a bed to a chair (including a wheelchair)?: Total Help needed standing up from a chair using your arms (e.g., wheelchair or bedside chair)?: Total Help needed to walk in hospital room?: Total Help needed climbing 3-5 steps with a railing? : Total 6 Click Score: 10    End of Session Equipment Utilized During Treatment: Gait belt Activity Tolerance: Patient tolerated treatment well;Patient limited by fatigue Patient left: in bed;with call bell/phone within reach;with bed alarm set Nurse Communication: Mobility status PT Visit Diagnosis: Difficulty in walking, not elsewhere classified (R26.2);Unsteadiness on feet (R26.81);Other abnormalities of gait and mobility (R26.89);Pain;Muscle weakness (generalized) (M62.81) Pain - Right/Left: Left Pain - part of body: Leg     Time: 1030-1102 PT Time Calculation (min) (ACUTE ONLY): 32 min  Charges:    $Therapeutic Exercise: 8-22 mins $Therapeutic Activity: 8-22 mins PT General Charges $$ ACUTE PT VISIT: 1 Visit                     Maekayla Giorgio R. PTA Acute Rehabilitation Services Office: [phone]   Therisa CHRISTELLA Boor 03/01/2024, 1:57 PM

## 2024-03-01 NOTE — TOC Progression Note (Signed)
 Transition of Care [facility]) - Progression Note    Patient Details  Name: Taylor Hughes MRN: [id number] Date of Birth: 12/10/1970  [facility]) CM/SW Contact  Lonzell Dorris LITTIE [name], LCSW Phone Number: 03/01/2024, 3:47 PM  Clinical Narrative:    CSW provided bed offers to pt. Pt stated he would look them over and talk with his family and have an answer in the morning. CSW will follow up.  Expected Discharge Plan: OP Rehab Barriers to Discharge: Continued Medical Work up, SNF Pending bed offer               Expected Discharge Plan and Services   Discharge Planning Services: CM Consult Post Acute Care Choice: Durable Medical Equipment Living arrangements for the past 2 months: Apartment Expected Discharge Date: 03/01/24               DME Arranged: Vannie rolling with seat DME Agency: [employer] Date DME Agency Contacted: 02/24/24 Time DME Agency Contacted: 1339 Representative spoke with at DME Agency: zack HH Arranged: NA HH Agency: NA         Social Drivers of Health (SDOH) Interventions SDOH Screenings   Food Insecurity: Food Insecurity Present (02/24/2024)  Housing: Low Risk  (02/24/2024)  Transportation Needs: No Transportation Needs (02/24/2024)  Utilities: Not At Risk (02/24/2024)  Depression (PHQ2-9): Low Risk  (12/03/2023)  Financial Resource Strain: Medium Risk (06/24/2023)  Physical Activity: Unknown (06/24/2023)  Social Connections: Socially Isolated (06/24/2023)  Stress: No Stress Concern Present (06/24/2023)  Tobacco Use: Low Risk  (02/26/2024)    Readmission Risk Interventions    03/24/2023    4:56 PM 08/15/2022    1:23 PM  Readmission Risk Prevention Plan  Transportation Screening Complete Complete  PCP or Specialist Appt within 5-7 Days  Complete  PCP or Specialist Appt within 3-5 Days Complete   Home Care Screening  Complete  Medication Review (RN CM)  Complete  HRI or Home Care Consult Complete   Social Work Consult for Recovery Care  Planning/Counseling Complete   Palliative Care Screening Not Applicable   Medication Review Oceanographer) Referral to Pharmacy

## 2024-03-02 ENCOUNTER — Other Ambulatory Visit (HOSPITAL_COMMUNITY): Payer: Self-pay

## 2024-03-02 DIAGNOSIS — M86272 Subacute osteomyelitis, left ankle and foot: Secondary | ICD-10-CM | POA: Diagnosis not present

## 2024-03-02 LAB — CBC
HCT: 30.1 % — ABNORMAL LOW (ref 39.0–52.0)
Hemoglobin: 9.3 g/dL — ABNORMAL LOW (ref 13.0–17.0)
MCH: 27 pg (ref 26.0–34.0)
MCHC: 30.9 g/dL (ref 30.0–36.0)
MCV: 87.5 fL (ref 80.0–100.0)
Platelets: 347 K/uL (ref 150–400)
RBC: 3.44 MIL/uL — ABNORMAL LOW (ref 4.22–5.81)
RDW: 18 % — ABNORMAL HIGH (ref 11.5–15.5)
WBC: 8.2 K/uL (ref 4.0–10.5)
nRBC: 0 % (ref 0.0–0.2)

## 2024-03-02 LAB — BASIC METABOLIC PANEL WITH GFR
Anion gap: 10 (ref 5–15)
BUN: 28 mg/dL — ABNORMAL HIGH (ref 6–20)
CO2: 17 mmol/L — ABNORMAL LOW (ref 22–32)
Calcium: 8 mg/dL — ABNORMAL LOW (ref 8.9–10.3)
Chloride: 106 mmol/L (ref 98–111)
Creatinine, Ser: 0.96 mg/dL (ref 0.61–1.24)
GFR, Estimated: >60 mL/min (ref >60)
Glucose, Bld: 334 mg/dL — ABNORMAL HIGH (ref 70–99)
Potassium: 4.3 mmol/L (ref 3.5–5.1)
Sodium: 133 mmol/L — ABNORMAL LOW (ref 135–145)

## 2024-03-02 LAB — GLUCOSE, CAPILLARY
Glucose-Capillary: 308 mg/dL — ABNORMAL HIGH (ref 70–99)
Glucose-Capillary: 228 mg/dL — ABNORMAL HIGH (ref 70–99)
Glucose-Capillary: 227 mg/dL — ABNORMAL HIGH (ref 70–99)
Glucose-Capillary: 219 mg/dL — ABNORMAL HIGH (ref 70–99)

## 2024-03-02 MED ORDER — INSULIN ASPART 100 UNIT/ML IJ SOLN
6.0000 [IU] | Freq: Three times a day (TID) | INTRAMUSCULAR | Status: DC
  Administered 2024-03-02: 6 [IU] via SUBCUTANEOUS

## 2024-03-02 MED ORDER — LOPERAMIDE HCL 2 MG PO CAPS
2.0000 mg | ORAL_CAPSULE | ORAL | Status: DC | PRN
  Administered 2024-03-02: 2 mg via ORAL
  Filled 2024-03-02: qty 1

## 2024-03-02 MED ORDER — SODIUM CHLORIDE 0.9 % IV BOLUS
1000.0000 mL | Freq: Once | INTRAVENOUS | Status: AC
  Administered 2024-03-02: 1000 mL via INTRAVENOUS

## 2024-03-02 MED ORDER — SODIUM CHLORIDE 0.9 % IV SOLN: INTRAVENOUS | Status: DC

## 2024-03-02 MED ORDER — INSULIN GLARGINE-YFGN 100 UNIT/ML ~~LOC~~ SOLN
30.0000 [IU] | Freq: Every day | SUBCUTANEOUS | Status: DC
  Administered 2024-03-02: 30 [IU] via SUBCUTANEOUS
  Filled 2024-03-02 (×2): qty 0.3

## 2024-03-02 MED ORDER — SIMETHICONE 80 MG PO CHEW
80.0000 mg | CHEWABLE_TABLET | Freq: Four times a day (QID) | ORAL | Status: DC | PRN
  Administered 2024-03-02 – 2024-03-03 (×3): 80 mg via ORAL
  Filled 2024-03-02 (×3): qty 1

## 2024-03-02 NOTE — Progress Notes (Signed)
 MEWS Progress Note  Patient Details Name: Taylor Hughes MRN: [id number] DOB: [birth year] Today's Date: 03/02/2024   MEWS Flowsheet Documentation:  Assess: MEWS Score Temp: 98.1 F (36.7 C) BP: (!) 136/90 MAP (mmHg): 100 Pulse Rate: (!) 122 ECG Heart Rate: 87 Resp: 19 Level of Consciousness: Alert SpO2: 99 % O2 Device: Room Air Patient Activity (if Appropriate): In bed O2 Flow Rate (L/min): 2 L/min Assess: MEWS Score MEWS Temp: 0 MEWS Systolic: 0 MEWS Pulse: 2 MEWS RR: 0 MEWS LOC: 0 MEWS Score: 2 MEWS Score Color: Yellow Assess: SIRS CRITERIA SIRS Temperature : 0 SIRS Respirations : 0 SIRS Pulse: 1 SIRS WBC: 0 SIRS Score Sum : 1 SIRS Temperature : 0 SIRS Pulse: 1 SIRS Respirations : 0 SIRS WBC: 0 SIRS Score Sum : 1 Assess: if the MEWS score is Yellow or Red Were vital signs accurate and taken at a resting state?: Yes Does the patient meet 2 or more of the SIRS criteria?: No MEWS guidelines implemented : Yes, yellow Treat MEWS Interventions: Considered administering scheduled or prn medications/treatments as ordered Take Vital Signs Increase Vital Sign Frequency : Yellow: Q2hr x1, continue Q4hrs until patient remains green for 12hrs Escalate MEWS: Escalate: Yellow: Discuss with charge nurse and consider notifying provider and/or RRT Notify: Charge Nurse/RN Name of Charge Nurse/RN Notified: JIll, RN Provider Notification Provider Name/Title: Owen Lore, MD Date Provider Notified: 03/02/24 Time Provider Notified: [phone] Method of Notification: Page Notification Reason: Other (Comment) (Yellow MEWS) Provider response: No new orders      Taylor Hughes 03/02/2024, 8:55 AM

## 2024-03-02 NOTE — Inpatient Diabetes Management (Signed)
 Inpatient Diabetes Program Recommendations  AACE/ADA: New Consensus Statement on Inpatient Glycemic Control (2015)  Target Ranges:  Prepandial:   less than 140 mg/dL      Peak postprandial:   less than 180 mg/dL (1-2 hours)      Critically ill patients:  140 - 180 mg/dL   Lab Results  Component Value Date   GLUCAP 228 (H) 03/02/2024   HGBA1C 10.9 (H) 02/22/2024    Review of Glycemic Control  Latest Reference Range & Units 03/01/24 08:16 03/01/24 11:46 03/01/24 17:55 03/01/24 21:54 03/02/24 08:25 03/02/24 11:36  Glucose-Capillary 70 - 99 mg/dL 738 (H) 772 (H) 804 (H) 210 (H) 308 (H) 228 (H)   Diabetes history: DM 2 Outpatient Diabetes medications:  FSL3 Current orders for Inpatient glycemic control:  Novolog  0-15 units tid with meals and HS Semglee  25 units q HS Novolog  4 units tid with meals  -   Consider increasing Semglee  to 30 units qhs -   Increase Novolog  8 units tid meal coverage if eating >50% of meals  Thanks,  Clotilda Bull RN, MSN, BC-ADM Inpatient Diabetes Coordinator Team Pager [phone] (8a-5p)

## 2024-03-02 NOTE — Plan of Care (Signed)

## 2024-03-02 NOTE — Progress Notes (Signed)
 Tele called and said pt was in AFIB made the provider aware. Pt is asymptomatic. No SOB or chest pain. Vitals WNL.

## 2024-03-02 NOTE — Plan of Care (Signed)

## 2024-03-02 NOTE — TOC Progression Note (Addendum)
 Transition of Care [facility]) - Progression Note    Patient Details  Name: Taylor Hughes MRN: [id number] Date of Birth: 1971-04-18  Transition of Care [facility]) CM/SW Contact  Jeoffrey LITTIE [name], LCSW Phone Number: 03/02/2024, 10:48 AM  Clinical Narrative:    CSW spoke with pt about SNF choice. Pt chose [facility]. CSW confirmed bed availability for 7/24 and will use [facility] to transport to facility.   Expected Discharge Plan: OP Rehab Barriers to Discharge: Continued Medical Work up, SNF Pending bed offer               Expected Discharge Plan and Services   Discharge Planning Services: CM Consult Post Acute Care Choice: Durable Medical Equipment Living arrangements for the past 2 months: Apartment Expected Discharge Date: 03/01/24               DME Arranged: Vannie rolling with seat DME Agency: [employer] Date DME Agency Contacted: 02/24/24 Time DME Agency Contacted: 1339 Representative spoke with at DME Agency: zack HH Arranged: NA HH Agency: NA         Social Drivers of Health (SDOH) Interventions SDOH Screenings   Food Insecurity: Food Insecurity Present (02/24/2024)  Housing: Low Risk  (02/24/2024)  Transportation Needs: No Transportation Needs (02/24/2024)  Utilities: Not At Risk (02/24/2024)  Depression (PHQ2-9): Low Risk  (12/03/2023)  Financial Resource Strain: Medium Risk (06/24/2023)  Physical Activity: Unknown (06/24/2023)  Social Connections: Socially Isolated (06/24/2023)  Stress: No Stress Concern Present (06/24/2023)  Tobacco Use: Low Risk  (02/26/2024)    Readmission Risk Interventions    03/24/2023    4:56 PM 08/15/2022    1:23 PM  Readmission Risk Prevention Plan  Transportation Screening Complete Complete  PCP or Specialist Appt within 5-7 Days  Complete  PCP or Specialist Appt within 3-5 Days Complete   Home Care Screening  Complete  Medication Review (RN CM)  Complete  HRI or Home Care Consult Complete   Social Work Consult for Recovery Care  Planning/Counseling Complete   Palliative Care Screening Not Applicable   Medication Review Oceanographer) Referral to Pharmacy

## 2024-03-02 NOTE — Progress Notes (Signed)
 Pt sinus tach with HR of 137, made provider aware, Pt still asymptomatic at this time.

## 2024-03-02 NOTE — Progress Notes (Signed)
 PROGRESS NOTE    Taylor Hughes  FMW:[id number] DOB: July 06, 1971 DOA: 02/21/2024 PCP: Delbert Clam, MD   Brief Narrative: 53 year old with past medical history significant for diabetes, hypertension, history of chest wall abscess and sternoclavicular MSSA infection treated in 2020, bilateral feet osteomyelitis right foot osteo status post TMA treated with 6 weeks of antibiotics, history of DKA presented on 7/13 with lethargy, metabolic encephalopathy secondary to DKA glucose more than 1000, bicarb less than 7, pH 6.8, he was resuscitated aggressively in the ICU.  Gastroenterology evaluated patient secondary to emesis in the setting of anemia. 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   Patient currently awaiting skilled nursing facility       Assessment & Plan:   Principal Problem:   Subacute osteomyelitis, left ankle and foot (HCC) Active Problems:   DKA (diabetic ketoacidosis) (HCC)   Acute on chronic anemia   Encephalopathy acute   Coffee ground emesis   Iron deficiency anemia   Cutaneous abscess of left foot   Erosive esophagitis   Duodenitis  1-Metabolic encephalopathy/severe sepsis on admission Severe DKA on admission DKA resolved -Encephalopathy resolved back to baseline  Sepsis on admission secondary to left foot infection, strep B bacteremia Treated with IV Unasyn  subsequently transition to Amoxil  until 8/24 -Hold Colace and MiraLAX    Left BKA 7/18 secondary to infected lower wound with a strep B bacteremia -WBAT and the stump is  shrinker in addition to wound VAC as per Ortho - Plan for skilled nursing facility for rehab - Continue pain management   Uncontrolled diabetes with frequent DKA, hyperglycemia A1c 10.9 Will increase Semglee  to 30 units daily Increase NovoLog  to 6 units with meals Will give IV fluids  Grade C erosive gastritis on endoscopy Underlying gastroparesis 2024 H. pylori was negative , pathology showed reactive gastropathy and metaplasia consistent with peptic duodenitis continue PPI twice daily until outpatient follow-up and endoscopy probably in 8 to 12 weeks Will need iron at discharge  Paroxysmal A-fib Continue metoprolol   AKI on admission from DKA resolved  Diarrhea: Metabolic acidosis: Will give IV bolus.  Okay to give him 1 or 2 Imodium 's     Nutrition Problem: Inadequate oral intake Etiology: chronic illness (gastroparesis and uncontrolled diabetes)    Signs/Symptoms: per patient/family report, energy intake < or equal to 75% for > or equal to 1 month    Interventions: Juven, Refer to RD note for recommendations  Estimated body mass index is 41.95 kg/m as calculated from the following:   Height as of this encounter: 6' (1.829 m).   Weight as of this encounter: 140.3 kg.   DVT prophylaxis: SCDs Code Status: Full code Family Communication: Care discussed with patient Disposition Plan:  Status is: Inpatient Remains inpatient appropriate because: management of DKA    Consultants:  Ortho ID  Procedures:    Antimicrobials:    Subjective: He is alert and conversant, he has been having multiple frequent bowel movement   Objective: Vitals:   03/02/24 0511 03/02/24 0832 03/02/24 1139 03/02/24 1730  BP: 118/89 (!) 136/90 125/83 107/70  Pulse: 90 (!) 122 91 (!) 116  Resp: 18  19 18 18   Temp: 97.9 F (36.6 C) 98.1 F (36.7 C) 98.2 F (36.8 C) 98.7 F (37.1 C)  TempSrc: Oral Oral  Oral  SpO2: 98% 99% 100% 100%  Weight:      Height:         Intake/Output Summary (Last 24 hours) at 03/02/2024 1800 Last data filed at 03/02/2024 1330 Gross per 24 hour  Intake 600 ml  Output 775 ml  Net -175 ml   Filed Weights   02/29/24 0500 03/01/24 0500 03/02/24 0500  Weight: (!) 137.4 kg (!) 137.4 kg (!) 140.3 kg    Examination:  General exam: Appears calm and comfortable  Respiratory system: Clear to auscultation. Respiratory effort normal. Cardiovascular system: S1 & S2 heard, RRR. No JVD, murmurs, rubs, gallops or clicks. No pedal edema. Gastrointestinal system: Abdomen is nondistended, soft and nontender. No organomegaly or masses felt. Normal bowel sounds heard. Central nervous system: Alert and oriented. No focal neurological deficits. Extremities: Left stump with wound VAC and shrinker  Data Reviewed: I have personally reviewed following labs and imaging studies  CBC: Recent Labs  Lab 02/25/24 0625 02/26/24 1640 02/27/24 0400 02/28/24 0740 03/02/24 0852  WBC 11.1* 9.2 10.4 7.1 8.2  HGB 8.9* 8.7* 9.6* 10.0* 9.3*  HCT 27.6* 28.6* 30.8* 33.0* 30.1*  MCV 84.1 87.2 85.8 89.2 87.5  PLT 334 331 344 366 347   Basic Metabolic Panel: Recent Labs  Lab 02/25/24 0625 02/26/24 0757 02/27/24 0400 02/28/24 0740 02/29/24 0650 03/02/24 0852  NA 144 141 137 139 137 133*  K 3.4* 4.2 4.6 3.8 3.6 4.3  CL 111 112* 107 107 105 106  CO2 23 15* 21* 21* 22 17*  GLUCOSE 213* 281* 369* 230* 249* 334*  BUN 21* 17 21* 30* 26* 28*  CREATININE 1.04 0.91 1.07 1.22 0.91 0.96  CALCIUM  9.0 8.5* 8.2* 8.3* 8.1* 8.0*  PHOS 1.5* 2.1*  --   --   --   --    GFR: Estimated Creatinine Clearance: 130.8 mL/min (by C-G formula based on SCr of 0.96 mg/dL). Liver Function Tests: No results for input(s): AST, ALT, ALKPHOS, BILITOT, PROT, ALBUMIN  in the last 168 hours. No results for input(s): LIPASE, AMYLASE in the last 168 hours. No results for input(s): AMMONIA in the last 168 hours. Coagulation Profile: No results for  input(s): INR, PROTIME in the last 168 hours. Cardiac Enzymes: No results for input(s): CKTOTAL, CKMB, CKMBINDEX, TROPONINI in the last 168 hours. BNP (last 3 results) No results for input(s): PROBNP in the last 8760 hours. HbA1C: No results for input(s): HGBA1C in the last 72 hours. CBG: Recent Labs  Lab 03/01/24 1755 03/01/24 2154 03/02/24 0825 03/02/24 1136 03/02/24 1726  GLUCAP 195* 210* 308* 228* 227*   Lipid Profile: No results for input(s): CHOL, HDL, LDLCALC, TRIG, CHOLHDL, LDLDIRECT in the last 72 hours. Thyroid  Function Tests: No results for input(s): TSH, T4TOTAL, FREET4, T3FREE, THYROIDAB in the last 72 hours. Anemia Panel: No results for input(s): VITAMINB12, FOLATE, FERRITIN, TIBC, IRON, RETICCTPCT in the last 72 hours. Sepsis Labs: No results for input(s): PROCALCITON, LATICACIDVEN in the last 168 hours.  Recent Results (from the past 240 hours)  Resp panel by RT-PCR (RSV, Flu A&B, Covid) Anterior Nasal Swab     Status: None   Collection Time: 02/21/24  9:03 PM   Specimen: Anterior Nasal Swab  Result Value Ref Range Status   SARS Coronavirus 2 by RT PCR NEGATIVE NEGATIVE Final   Influenza A by PCR NEGATIVE  NEGATIVE Final   Influenza B by PCR NEGATIVE NEGATIVE Final    Comment: (NOTE) The Xpert Xpress SARS-CoV-2/FLU/RSV plus assay is intended as an aid in the diagnosis of influenza from Nasopharyngeal swab specimens and should not be used as a sole basis for treatment. Nasal washings and aspirates are unacceptable for Xpert Xpress SARS-CoV-2/FLU/RSV testing.  Fact Sheet for Patients: BloggerCourse.com  Fact Sheet for Healthcare Providers: SeriousBroker.it  This test is not yet approved or cleared by the [place]  FDA and has been authorized for detection and/or diagnosis of SARS-CoV-2 by FDA under an Emergency Use Authorization (EUA). This EUA will  remain in effect (meaning this test can be used) for the duration of the COVID-19 declaration under Section 564(b)(1) of the Act, 21 U.S.C. section 360bbb-3(b)(1), unless the authorization is terminated or revoked.     Resp Syncytial Virus by PCR NEGATIVE NEGATIVE Final    Comment: (NOTE) Fact Sheet for Patients: BloggerCourse.com  Fact Sheet for Healthcare Providers: SeriousBroker.it  This test is not yet approved or cleared by the [place]  FDA and has been authorized for detection and/or diagnosis of SARS-CoV-2 by FDA under an Emergency Use Authorization (EUA). This EUA will remain in effect (meaning this test can be used) for the duration of the COVID-19 declaration under Section 564(b)(1) of the Act, 21 U.S.C. section 360bbb-3(b)(1), unless the authorization is terminated or revoked.  Performed at [facility], [address] [place], [place] [address]   Blood Culture (routine x 2)     Status: Abnormal   Collection Time: 02/21/24  9:03 PM   Specimen: BLOOD  Result Value Ref Range Status   Specimen Description BLOOD BLOOD RIGHT HAND  Final   Special Requests   Final    BOTTLES DRAWN AEROBIC AND ANAEROBIC Blood Culture results may not be optimal due to an inadequate volume of blood received in culture bottles   Culture  Setup Time   Final    GRAM POSITIVE COCCI IN CHAINS IN BOTH AEROBIC AND ANAEROBIC BOTTLES CRITICAL RESULT CALLED TO, READ BACK BY AND VERIFIED WITH: PHARMD EMILY S ON [id number] @1056  BY SM Performed at [facility], [address] [place], [place] [address]    Culture GROUP B STREP(S.AGALACTIAE)ISOLATED (A)  Final   Report Status 02/24/2024 FINAL  Final   Organism ID, Bacteria GROUP B STREP(S.AGALACTIAE)ISOLATED  Final      Susceptibility   Group b strep(s.agalactiae)isolated - MIC*    CLINDAMYCIN  >=1 RESISTANT Resistant     AMPICILLIN  <=0.25 SENSITIVE Sensitive     ERYTHROMYCIN >=8  RESISTANT Resistant     VANCOMYCIN  1 SENSITIVE Sensitive     CEFTRIAXONE  <=0.12 SENSITIVE Sensitive     LEVOFLOXACIN 0.5 SENSITIVE Sensitive     PENICILLIN  <=0.06 SENSITIVE Sensitive     * GROUP B STREP(S.AGALACTIAE)ISOLATED  Blood Culture ID Panel (Reflexed)     Status: Abnormal   Collection Time: 02/21/24  9:03 PM  Result Value Ref Range Status   Enterococcus faecalis NOT DETECTED NOT DETECTED Final   Enterococcus Faecium NOT DETECTED NOT DETECTED Final   Listeria monocytogenes NOT DETECTED NOT DETECTED Final   Staphylococcus species NOT DETECTED NOT DETECTED Final   Staphylococcus aureus (BCID) NOT DETECTED NOT DETECTED Final   Staphylococcus epidermidis NOT DETECTED NOT DETECTED Final   Staphylococcus lugdunensis NOT DETECTED NOT DETECTED Final   Streptococcus species DETECTED (A) NOT DETECTED Final    Comment: CRITICAL RESULT CALLED TO, READ BACK BY AND VERIFIED WITH: PHARMD EMILY S  ON [id number] @1056  BY SM    Streptococcus agalactiae DETECTED (A) NOT DETECTED Final    Comment: CRITICAL RESULT CALLED TO, READ BACK BY AND VERIFIED WITH: PHARMD EMILY S ON [id number] @1056  BY SM    Streptococcus pneumoniae NOT DETECTED NOT DETECTED Final   Streptococcus pyogenes NOT DETECTED NOT DETECTED Final   A.calcoaceticus-baumannii NOT DETECTED NOT DETECTED Final   Bacteroides fragilis NOT DETECTED NOT DETECTED Final   Enterobacterales NOT DETECTED NOT DETECTED Final   Enterobacter cloacae complex NOT DETECTED NOT DETECTED Final   Escherichia coli NOT DETECTED NOT DETECTED Final   Klebsiella aerogenes NOT DETECTED NOT DETECTED Final   Klebsiella oxytoca NOT DETECTED NOT DETECTED Final   Klebsiella pneumoniae NOT DETECTED NOT DETECTED Final   Proteus species NOT DETECTED NOT DETECTED Final   Salmonella species NOT DETECTED NOT DETECTED Final   Serratia marcescens NOT DETECTED NOT DETECTED Final   Haemophilus influenzae NOT DETECTED NOT DETECTED Final   Neisseria meningitidis NOT DETECTED NOT  DETECTED Final   Pseudomonas aeruginosa NOT DETECTED NOT DETECTED Final   Stenotrophomonas maltophilia NOT DETECTED NOT DETECTED Final   Candida albicans NOT DETECTED NOT DETECTED Final   Candida auris NOT DETECTED NOT DETECTED Final   Candida glabrata NOT DETECTED NOT DETECTED Final   Candida krusei NOT DETECTED NOT DETECTED Final   Candida parapsilosis NOT DETECTED NOT DETECTED Final   Candida tropicalis NOT DETECTED NOT DETECTED Final   Cryptococcus neoformans/gattii NOT DETECTED NOT DETECTED Final    Comment: Performed at [facility], [address] [place], [place] [address]  MRSA Next Gen by PCR, Nasal     Status: Abnormal   Collection Time: 02/21/24 11:25 PM   Specimen: Nasal Mucosa; Nasal Swab  Result Value Ref Range Status   MRSA by PCR Next Gen DETECTED (A) NOT DETECTED Final    Comment: RESULT CALLED TO, READ BACK BY AND VERIFIED WITH: WILLIAMSON RN 02/22/2024 @ 0205 BY AB (NOTE) The GeneXpert MRSA Assay (FDA approved for NASAL specimens only), is one component of a comprehensive MRSA colonization surveillance program. It is not intended to diagnose MRSA infection nor to guide or monitor treatment for MRSA infections. Test performance is not FDA approved in patients less than 57 years old. Performed at [facility], [address] [place], [place] [address]   Culture, blood (Routine X 2) w Reflex to ID Panel     Status: None   Collection Time: 02/22/24 10:30 AM   Specimen: BLOOD RIGHT HAND  Result Value Ref Range Status   Specimen Description BLOOD RIGHT HAND  Final   Special Requests   Final    BOTTLES DRAWN AEROBIC AND ANAEROBIC Blood Culture adequate volume   Culture   Final    NO GROWTH 5 DAYS Performed at [facility], [address] [place], [place] [address]    Report Status 02/27/2024 FINAL  Final  Culture, blood (Routine X 2) w Reflex to ID Panel     Status: None   Collection Time: 02/22/24 10:30 AM   Specimen: BLOOD RIGHT HAND   Result Value Ref Range Status   Specimen Description BLOOD RIGHT HAND  Final   Special Requests   Final    BOTTLES DRAWN AEROBIC ONLY Blood Culture adequate volume   Culture   Final    NO GROWTH 5 DAYS Performed at [facility], [address], [place], [place] [address]    Report Status 02/27/2024 FINAL  Final  Radiology Studies: No results found.      Scheduled Meds:  acetaminophen   1,000 mg Oral Q8H   acidophilus  2 capsule Oral TID   amoxicillin   1,000 mg Oral Q8H   atorvastatin   20 mg Oral QHS   Chlorhexidine  Gluconate Cloth  6 each Topical Q0600   ferrous sulfate   325 mg Oral Q breakfast   gabapentin   300 mg Oral TID   insulin  aspart  0-15 Units Subcutaneous TID WC   insulin  aspart  0-5 Units Subcutaneous QHS   insulin  aspart  6 Units Subcutaneous TID WC   insulin  glargine-yfgn  30 Units Subcutaneous QHS   metoprolol  tartrate  50 mg Oral BID   multivitamin with minerals  1 tablet Oral Daily   nutrition supplement (JUVEN)  1 packet Oral BID BM   pantoprazole   40 mg Oral Daily   sodium chloride  flush  3 mL Intravenous Q12H   Continuous Infusions:   LOS: 10 days    Time spent: 35 Minutes    Derricka Mertz A Priest Lockridge, MD [facility]   If 7PM-7AM, please contact night-coverage [email or web address]  03/02/2024, 6:00 PM

## 2024-03-03 DIAGNOSIS — M86272 Subacute osteomyelitis, left ankle and foot: Secondary | ICD-10-CM | POA: Diagnosis not present

## 2024-03-03 LAB — BASIC METABOLIC PANEL WITH GFR
Anion gap: 9 (ref 5–15)
BUN: 28 mg/dL — ABNORMAL HIGH (ref 6–20)
CO2: 20 mmol/L — ABNORMAL LOW (ref 22–32)
Calcium: 8 mg/dL — ABNORMAL LOW (ref 8.9–10.3)
Chloride: 106 mmol/L (ref 98–111)
Creatinine, Ser: 0.91 mg/dL (ref 0.61–1.24)
GFR, Estimated: >60 mL/min (ref >60)
Glucose, Bld: 254 mg/dL — ABNORMAL HIGH (ref 70–99)
Potassium: 4.4 mmol/L (ref 3.5–5.1)
Sodium: 135 mmol/L (ref 135–145)

## 2024-03-03 LAB — GLUCOSE, CAPILLARY
Glucose-Capillary: 288 mg/dL — ABNORMAL HIGH (ref 70–99)
Glucose-Capillary: 211 mg/dL — ABNORMAL HIGH (ref 70–99)
Glucose-Capillary: 75 mg/dL (ref 70–99)
Glucose-Capillary: 134 mg/dL — ABNORMAL HIGH (ref 70–99)

## 2024-03-03 MED ORDER — SODIUM CHLORIDE 0.9 % IV BOLUS
500.0000 mL | Freq: Once | INTRAVENOUS | Status: AC
  Administered 2024-03-03: 500 mL via INTRAVENOUS

## 2024-03-03 MED ORDER — INSULIN ASPART 100 UNIT/ML IJ SOLN
10.0000 [IU] | Freq: Three times a day (TID) | INTRAMUSCULAR | Status: DC
  Administered 2024-03-03 – 2024-03-04 (×4): 10 [IU] via SUBCUTANEOUS

## 2024-03-03 MED ORDER — INSULIN GLARGINE-YFGN 100 UNIT/ML ~~LOC~~ SOLN
34.0000 [IU] | Freq: Every day | SUBCUTANEOUS | Status: DC
  Administered 2024-03-03: 34 [IU] via SUBCUTANEOUS
  Filled 2024-03-03 (×2): qty 0.34

## 2024-03-03 MED ORDER — INSULIN ASPART 100 UNIT/ML IJ SOLN
8.0000 [IU] | Freq: Three times a day (TID) | INTRAMUSCULAR | Status: DC
  Administered 2024-03-03: 8 [IU] via SUBCUTANEOUS

## 2024-03-03 NOTE — Progress Notes (Signed)
 PROGRESS NOTE    Taylor Hughes  FMW:[id number] DOB: March 21, 1971 DOA: 02/21/2024 PCP: Delbert Clam, MD   Brief Narrative: 53 year old with past medical history significant for diabetes, hypertension, history of chest wall abscess and sternoclavicular MSSA infection treated in 2020, bilateral feet osteomyelitis right foot osteo status post TMA treated with 6 weeks of antibiotics, history of DKA presented on 7/13 with lethargy, metabolic encephalopathy secondary to DKA glucose more than 1000, bicarb less than 7, pH 6.8, he was resuscitated aggressively in the ICU.  Gastroenterology evaluated patient secondary to emesis in the setting of anemia. 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   Patient currently awaiting skilled nursing facility       Assessment & Plan:   Principal Problem:   Subacute osteomyelitis, left ankle and foot (HCC) Active Problems:   DKA (diabetic ketoacidosis) (HCC)   Acute on chronic anemia   Encephalopathy acute   Coffee ground emesis   Iron deficiency anemia   Cutaneous abscess of left foot   Erosive esophagitis   Duodenitis  1-Metabolic encephalopathy/severe sepsis on admission Severe DKA on admission DKA resolved -Encephalopathy resolved back to baseline  Sepsis on admission secondary to left foot infection, strep B bacteremia Treated with IV Unasyn  subsequently transition to Amoxil  until 8/24 -Hold Colace and MiraLAX    Left BKA 7/18 secondary to infected lower wound with a strep B bacteremia -WBAT and the stump is  shrinker in addition to wound VAC as per Ortho - Plan for skilled nursing facility for rehab - Continue pain management   Uncontrolled diabetes with frequent DKA, hyperglycemia A1c 10.9 Will increase Semglee  to 34 units daily Increase NovoLog  to 10 units with meals CBG down to 280---210   Grade C erosive gastritis on endoscopy Underlying gastroparesis 2024 H. pylori was negative , pathology showed reactive gastropathy and metaplasia consistent with peptic duodenitis continue PPI twice daily until outpatient follow-up and endoscopy probably in 8 to 12 weeks Will need iron at discharge  Paroxysmal A-fib Continue metoprolol   AKI on admission from DKA resolved  Diarrhea: Metabolic acidosis: Okay to give him 1 or 2 Imodium 's Diarrhea improved. He feels better, also from gas. Acidosis improved. Would keep fluids for now.      Nutrition Problem: Inadequate oral intake Etiology: chronic illness (gastroparesis and uncontrolled diabetes)    Signs/Symptoms: per patient/family report, energy intake < or equal to 75% for > or equal to 1 month    Interventions: Juven, Refer to RD note for recommendations  Estimated body mass index is 42.55 kg/m as calculated from the following:   Height as of this encounter: 6' (1.829 m).   Weight as of this encounter: 142.3 kg.   DVT prophylaxis: SCDs Code Status: Full code Family Communication: Care discussed with patient Disposition Plan:  Status pd:Eojw for rehab tomorrow     Consultants:  Ortho ID  Procedures:    Antimicrobials:    Subjective: He is alert, he is worry about his CBG still been high. He wants to make sure he work on his diet. We discussed he still needs protein intake. Nutritionist consulted.   Objective: Vitals:  03/03/24 0433 03/03/24 0500 03/03/24 0825 03/03/24 1108  BP: 116/78  (!) 132/94 110/87  Pulse: (!) 106  (!) 117 (!) 103  Resp: 16  16 16   Temp: 98.3 F (36.8 C)  98.9 F (37.2 C) 98.1 F (36.7  C)  TempSrc: Oral  Oral Oral  SpO2: 100%  100% 100%  Weight:  (!) 142.3 kg    Height:        Intake/Output Summary (Last 24 hours) at 03/03/2024 1444 Last data filed at 03/03/2024 0830 Gross per 24 hour  Intake 1956.32 ml  Output 0 ml  Net 1956.32 ml   Filed Weights   03/01/24 0500 03/02/24 0500 03/03/24 0500  Weight: (!) 137.4 kg (!) 140.3 kg (!) 142.3 kg    Examination:  General exam: NAD  Respiratory system: CTA Cardiovascular system: S 1, S 2 RRR Gastrointestinal system: BS present, soft, nt Central nervous system: Alert Extremities: Left stump with wound VAC and shrinker  Data Reviewed: I have personally reviewed following labs and imaging studies  CBC: Recent Labs  Lab 02/26/24 1640 02/27/24 0400 02/28/24 0740 03/02/24 0852  WBC 9.2 10.4 7.1 8.2  HGB 8.7* 9.6* 10.0* 9.3*  HCT 28.6* 30.8* 33.0* 30.1*  MCV 87.2 85.8 89.2 87.5  PLT 331 344 366 347   Basic Metabolic Panel: Recent Labs  Lab 02/26/24 0757 02/27/24 0400 02/28/24 0740 02/29/24 0650 03/02/24 0852 03/03/24 0503  NA 141 137 139 137 133* 135  K 4.2 4.6 3.8 3.6 4.3 4.4  CL 112* 107 107 105 106 106  CO2 15* 21* 21* 22 17* 20*  GLUCOSE 281* 369* 230* 249* 334* 254*  BUN 17 21* 30* 26* 28* 28*  CREATININE 0.91 1.07 1.22 0.91 0.96 0.91  CALCIUM  8.5* 8.2* 8.3* 8.1* 8.0* 8.0*  PHOS 2.1*  --   --   --   --   --    GFR: Estimated Creatinine Clearance: 139 mL/min (by C-G formula based on SCr of 0.91 mg/dL). Liver Function Tests: No results for input(s): AST, ALT, ALKPHOS, BILITOT, PROT, ALBUMIN  in the last 168 hours. No results for input(s): LIPASE, AMYLASE in the last 168 hours. No results for input(s): AMMONIA in the last 168 hours. Coagulation Profile: No results for input(s): INR, PROTIME in the last 168 hours. Cardiac Enzymes: No results for input(s): CKTOTAL, CKMB, CKMBINDEX, TROPONINI in the last 168 hours. BNP (last 3 results) No results for input(s):  PROBNP in the last 8760 hours. HbA1C: No results for input(s): HGBA1C in the last 72 hours. CBG: Recent Labs  Lab 03/02/24 1136 03/02/24 1726 03/02/24 2106 03/03/24 0819 03/03/24 1213  GLUCAP 228* 227* 219* 288* 211*   Lipid Profile: No results for input(s): CHOL, HDL, LDLCALC, TRIG, CHOLHDL, LDLDIRECT in the last 72 hours. Thyroid  Function Tests: No results for input(s): TSH, T4TOTAL, FREET4, T3FREE, THYROIDAB in the last 72 hours. Anemia Panel: No results for input(s): VITAMINB12, FOLATE, FERRITIN, TIBC, IRON, RETICCTPCT in the last 72 hours. Sepsis Labs: No results for input(s): PROCALCITON, LATICACIDVEN in the last 168 hours.  No results found for this or any previous visit (from the past 240 hours).        Radiology Studies: No results found.      Scheduled Meds:  acetaminophen   1,000 mg Oral Q8H   acidophilus  2 capsule Oral TID   amoxicillin   1,000 mg Oral Q8H   atorvastatin   20 mg Oral QHS   Chlorhexidine  Gluconate Cloth  6 each Topical Q0600   ferrous  sulfate  325 mg Oral Q breakfast   gabapentin   300 mg Oral TID   insulin  aspart  0-15 Units Subcutaneous TID WC   insulin  aspart  0-5 Units Subcutaneous QHS   insulin  aspart  10 Units Subcutaneous TID WC   insulin  glargine-yfgn  34 Units Subcutaneous QHS   metoprolol  tartrate  50 mg Oral BID   multivitamin with minerals  1 tablet Oral Daily   nutrition supplement (JUVEN)  1 packet Oral BID BM   pantoprazole   40 mg Oral Daily   sodium chloride  flush  3 mL Intravenous Q12H   Continuous Infusions:  sodium chloride  100 mL/hr at 03/02/24 2121     LOS: 11 days    Time spent: 35 Minutes    Taylor Maharaj A Marjorie Lussier, MD [facility]   If 7PM-7AM, please contact night-coverage [email or web address]  03/03/2024, 2:44 PM

## 2024-03-03 NOTE — Plan of Care (Signed)
  Problem: Education: Goal: Knowledge of General Education information will improve Description: Including pain rating scale, medication(s)/side effects and non-pharmacologic comfort measures Outcome: Progressing   Problem: Health Behavior/Discharge Planning: Goal: Ability to manage health-related needs will improve Outcome: Progressing   Problem: Nutrition: Goal: Adequate nutrition will be maintained Outcome: Progressing   Problem: Coping: Goal: Level of anxiety will decrease Outcome: Progressing   Problem: Elimination: Goal: Will not experience complications related to bowel motility Outcome: Progressing   Problem: Safety: Goal: Ability to remain free from injury will improve Outcome: Progressing

## 2024-03-03 NOTE — Progress Notes (Signed)
 Physical Therapy Treatment Patient Details Name: Taylor Hughes MRN: [id number] DOB: 1971-05-21 Today's Date: 03/03/2024   History of Present Illness Taylor Hughes is a 53 y.o. male admitted 02/21/24 with DKA, Lt foot wound, AKI. 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 PMHx: Rt transmet amputation, T2DM, peripheral neuropathy, Afib, HTN, GERD.    PT Comments  Pt resting in bed on arrival, eager for mobility and demonstrating continues progress towards acute goals. Pt able to come to sitting EOB with supervision for safety with good sequencing. Pt with x2 initial attempts to stand with RW unsuccessful.  Next attempt with stedy, pt able to boost to stand with light min A with good weight shift to R with pt able to minatain upright standing >2 mins. Pt then able to stand to RW x2 trials with light min A with this PTA anterior to RW to provide steadying assist. Pt seated up EOB at end of session and encouraged to sit up for all meals, with RN aware. Pt continues to benefit from skilled PT services to progress toward functional mobility goals.     If plan is discharge home, recommend the following: Two people to help with walking and/or transfers;A lot of help with bathing/dressing/bathroom;Assistance with cooking/housework;Assist for transportation;Help with stairs or ramp for entrance   Can travel by private vehicle     No  Equipment Recommendations  Wheelchair (measurements PT);BSC/3in1;Hoyer lift;Wheelchair cushion (measurements PT);Hospital bed    Recommendations for Other Services       Precautions / Restrictions Precautions Precautions: Fall Recall of Precautions/Restrictions: Impaired Precaution/Restrictions Comments: LLE wound vac, RLE unna boot Required Braces or Orthoses: Other Brace Other Brace: LLE limb guard Restrictions Weight Bearing Restrictions Per Provider Order: No LLE Weight Bearing Per Provider  Order: Non weight bearing     Mobility  Bed Mobility Overal bed mobility: Needs Assistance Bed Mobility: Supine to Sit, Sit to Supine     Supine to sit: Used rails, HOB elevated, Supervision     General bed mobility comments: supervision for safety    Transfers Overall transfer level: Needs assistance Equipment used: Rolling walker (2 wheels), Ambulation equipment used Transfers: Sit to/from Stand Sit to Stand: Min assist, From elevated surface           General transfer comment: pt attempting to stand x2 wto RW, unsucessfully, next attempt with stedy with pt able to boost to stand with light min A with good weight shift to R and able to minatain standing >2 mins. pt standing from stedy pads with CGA, pt then able to stand to RW x2 trials with light min A with this PTA,anterior to pt to steady RW. good UE use throughout    Ambulation/Gait               General Gait Details: unable   Stairs             Wheelchair Mobility     Tilt Bed    Modified Rankin (Stroke Patients Only)       Balance Overall balance assessment: Needs assistance Sitting-balance support: Feet supported, Bilateral upper extremity supported Sitting balance-Leahy Scale: Fair Sitting balance - Comments: Pt sat EOB with supervision.   Standing balance support: During functional activity, Bilateral upper extremity supported, Reliant on assistive device for balance Standing balance-Leahy Scale: Poor Standing balance comment: reliant on UE support in standing  Communication Communication Communication: No apparent difficulties  Cognition Arousal: Alert Behavior During Therapy: WFL for tasks assessed/performed   PT - Cognitive impairments: No apparent impairments                         Following commands: Intact      Cueing Cueing Techniques: Verbal cues, Gestural cues  Exercises      General Comments        Pertinent  Vitals/Pain Pain Assessment Pain Assessment: Faces Faces Pain Scale: Hurts a little bit Pain Location: LLE with movement and RLE with weight bearing Pain Descriptors / Indicators: Tightness Pain Intervention(s): Monitored during session, Limited activity within patient's tolerance    Home Living                          Prior Function            PT Goals (current goals can now be found in the care plan section) Acute Rehab PT Goals Patient Stated Goal: Go to rehab and get stronger before returning home PT Goal Formulation: With patient Time For Goal Achievement: 03/12/24 Progress towards PT goals: Progressing toward goals    Frequency    Min 2X/week      PT Plan      Co-evaluation              AM-PAC PT 6 Clicks Mobility   Outcome Measure  Help needed turning from your back to your side while in a flat bed without using bedrails?: A Little Help needed moving from lying on your back to sitting on the side of a flat bed without using bedrails?: A Little Help needed moving to and from a bed to a chair (including a wheelchair)?: Total Help needed standing up from a chair using your arms (e.g., wheelchair or bedside chair)?: Total Help needed to walk in hospital room?: Total Help needed climbing 3-5 steps with a railing? : Total 6 Click Score: 10    End of Session   Activity Tolerance: Patient tolerated treatment well;Patient limited by fatigue Patient left: in bed;with call bell/phone within reach;with bed alarm set;Other (comment) (seated up EOB) Nurse Communication: Mobility status PT Visit Diagnosis: Difficulty in walking, not elsewhere classified (R26.2);Unsteadiness on feet (R26.81);Other abnormalities of gait and mobility (R26.89);Pain;Muscle weakness (generalized) (M62.81) Pain - Right/Left: Left Pain - part of body: Leg     Time: 8560-8495 PT Time Calculation (min) (ACUTE ONLY): 25 min  Charges:    $Therapeutic Activity: 23-37 mins PT  General Charges $$ ACUTE PT VISIT: 1 Visit                     Taylor Hughes R. PTA Acute Rehabilitation Services Office: [phone]   Therisa CHRISTELLA Boor 03/03/2024, 4:11 PM

## 2024-03-04 DIAGNOSIS — E1165 Type 2 diabetes mellitus with hyperglycemia: Secondary | ICD-10-CM | POA: Diagnosis not present

## 2024-03-04 DIAGNOSIS — D649 Anemia, unspecified: Secondary | ICD-10-CM | POA: Diagnosis not present

## 2024-03-04 DIAGNOSIS — M86272 Subacute osteomyelitis, left ankle and foot: Secondary | ICD-10-CM | POA: Diagnosis not present

## 2024-03-04 DIAGNOSIS — M6281 Muscle weakness (generalized): Secondary | ICD-10-CM | POA: Diagnosis not present

## 2024-03-04 DIAGNOSIS — I10 Essential (primary) hypertension: Secondary | ICD-10-CM | POA: Diagnosis not present

## 2024-03-04 DIAGNOSIS — Z8614 Personal history of Methicillin resistant Staphylococcus aureus infection: Secondary | ICD-10-CM | POA: Diagnosis not present

## 2024-03-04 DIAGNOSIS — K2980 Duodenitis without bleeding: Secondary | ICD-10-CM | POA: Diagnosis not present

## 2024-03-04 DIAGNOSIS — D509 Iron deficiency anemia, unspecified: Secondary | ICD-10-CM | POA: Diagnosis not present

## 2024-03-04 DIAGNOSIS — R7881 Bacteremia: Secondary | ICD-10-CM | POA: Diagnosis not present

## 2024-03-04 DIAGNOSIS — E1110 Type 2 diabetes mellitus with ketoacidosis without coma: Secondary | ICD-10-CM | POA: Diagnosis not present

## 2024-03-04 DIAGNOSIS — Z89512 Acquired absence of left leg below knee: Secondary | ICD-10-CM | POA: Diagnosis not present

## 2024-03-04 DIAGNOSIS — B951 Streptococcus, group B, as the cause of diseases classified elsewhere: Secondary | ICD-10-CM | POA: Diagnosis not present

## 2024-03-04 DIAGNOSIS — K208 Other esophagitis without bleeding: Secondary | ICD-10-CM | POA: Diagnosis not present

## 2024-03-04 DIAGNOSIS — Z4781 Encounter for orthopedic aftercare following surgical amputation: Secondary | ICD-10-CM | POA: Diagnosis not present

## 2024-03-04 LAB — GLUCOSE, CAPILLARY
Glucose-Capillary: 278 mg/dL — ABNORMAL HIGH (ref 70–99)
Glucose-Capillary: 193 mg/dL — ABNORMAL HIGH (ref 70–99)
Glucose-Capillary: 122 mg/dL — ABNORMAL HIGH (ref 70–99)

## 2024-03-04 MED ORDER — INSULIN GLARGINE-YFGN 100 UNIT/ML ~~LOC~~ SOLN: 34.0000 [IU] | Freq: Every day | SUBCUTANEOUS | 0 refills | Status: DC

## 2024-03-04 MED ORDER — INSULIN ASPART 100 UNIT/ML IJ SOLN: 0.0000 [IU] | Freq: Three times a day (TID) | INTRAMUSCULAR | 11 refills | Status: DC

## 2024-03-04 MED ORDER — SIMETHICONE 80 MG PO CHEW: 80.0000 mg | CHEWABLE_TABLET | Freq: Four times a day (QID) | ORAL | 0 refills | Status: AC | PRN

## 2024-03-04 MED ORDER — INSULIN ASPART 100 UNIT/ML IJ SOLN: 10.0000 [IU] | Freq: Three times a day (TID) | INTRAMUSCULAR | 0 refills | Status: DC

## 2024-03-04 NOTE — Progress Notes (Signed)
 Nutrition Brief Note  Received consult for diabetes education, pt being discharged to SNF, provided handouts in AVS.    Continue current nutrition interventions  If nutrition issues arise, please consult RD.   Olivia Kenning, RD Registered Dietitian  See Amion for more information

## 2024-03-04 NOTE — Plan of Care (Signed)
  Problem: Education: Goal: Knowledge of General Education information will improve Description: Including pain rating scale, medication(s)/side effects and non-pharmacologic comfort measures Outcome: Progressing   Problem: Activity: Goal: Risk for activity intolerance will decrease Outcome: Progressing   Problem: Nutrition: Goal: Adequate nutrition will be maintained Outcome: Progressing   Problem: Elimination: Goal: Will not experience complications related to bowel motility Outcome: Progressing   Problem: Pain Managment: Goal: General experience of comfort will improve and/or be controlled Outcome: Progressing   Problem: Safety: Goal: Ability to remain free from injury will improve Outcome: Progressing

## 2024-03-04 NOTE — TOC Transition Note (Signed)
 Transition of Care [facility]) - Discharge Note   Patient Details  Name: Taylor Hughes MRN: [id number] Date of Birth: January 25, 1971  [facility] of [facility] CM/SW Contact:  Jeoffrey LITTIE [name], LCSW Phone Number: 03/04/2024, 12:19 PM   Clinical Narrative:    Patient will DC to: [facility] Anticipated DC date: 03/04/24 Family notified: Yes Transport by: [facility]   Per MD patient ready for DC to [facility] . RN to call report prior to discharge [phone] . RN, patient, patient's family, and facility notified of DC. Discharge Summary and FL2 sent to facility. DC packet on chart along with scripts. Ambulance transport requested for patient.   CSW will sign off for now as social work intervention is no longer needed. Please consult us  again if new needs arise.     Final next level of care: Skilled Nursing Facility Barriers to Discharge: Barriers Resolved   Patient Goals and CMS Choice Patient states their goals for this hospitalization and ongoing recovery are:: SNF          Discharge Placement   Existing PASRR number confirmed : 03/04/24          Patient chooses bed at: [facility] Patient to be transferred to facility by: [facility] Name of family member notified: Erminio Patient and family notified of of transfer: 03/04/24  Discharge Plan and Services Additional resources added to the After Visit Summary for     Discharge Planning Services: CM Consult Post Acute Care Choice: Durable Medical Equipment          DME Arranged: Vannie rolling with seat DME Agency: [employer] Date DME Agency Contacted: 02/24/24 Time DME Agency Contacted: [id number] Representative spoke with at DME Agency: zack HH Arranged: NA HH Agency: NA        Social Drivers of Health (SDOH) Interventions SDOH Screenings   Food Insecurity: Food Insecurity Present (02/24/2024)  Housing: Low Risk  (02/24/2024)  Transportation Needs: No Transportation Needs (02/24/2024)  Utilities: Not At Risk (02/24/2024)   Depression (PHQ2-9): Low Risk  (12/03/2023)  Financial Resource Strain: Medium Risk (06/24/2023)  Physical Activity: Unknown (06/24/2023)  Social Connections: Socially Isolated (06/24/2023)  Stress: No Stress Concern Present (06/24/2023)  Tobacco Use: Low Risk  (02/26/2024)     Readmission Risk Interventions    03/24/2023    4:56 PM 08/15/2022    1:23 PM  Readmission Risk Prevention Plan  Transportation Screening Complete Complete  PCP or Specialist Appt within 5-7 Days  Complete  PCP or Specialist Appt within 3-5 Days Complete   Home Care Screening  Complete  Medication Review (RN CM)  Complete  HRI or Home Care Consult Complete   Social Work Consult for Recovery Care Planning/Counseling Complete   Palliative Care Screening Not Applicable   Medication Review Oceanographer) Referral to Pharmacy

## 2024-03-04 NOTE — Discharge Summary (Signed)
 Physician Discharge Summary   Patient: Taylor Hughes MRN: [id number] DOB: Jun 15, 1971  Admit date:     02/21/2024  Discharge date: 03/04/24  Discharge Physician: Owen DELENA Lore   PCP: Newlin, Enobong, MD   Recommendations at discharge:    Needs to follow up with Dr Harden.  Needs to monitor renal function.  Adjust insulin  as needed.  Requires outpatient follow-up with orthopedics Dr. Harden, wound [facility], shrinker weightbearing precautions as per him Completion of amoxicillin  8/24 as per below and outpatient follow-up with Dr. Dennise of [facility] Dr. Suzann [facility] for outpatient coordination follow-up for repeat endoscopy  Discharge Diagnoses: Principal Problem:   Subacute osteomyelitis, left ankle and foot (HCC) Active Problems:   DKA (diabetic ketoacidosis) (HCC)   Acute on chronic anemia   Encephalopathy acute   Coffee ground emesis   Iron deficiency anemia   Cutaneous abscess of left foot   Erosive esophagitis   Duodenitis  Resolved Problems:   * No resolved hospital problems. *  Hospital Course: 53 year old with past medical history significant for diabetes, hypertension, history of chest wall abscess and sternoclavicular MSSA infection treated in 2020, bilateral feet osteomyelitis right foot osteo status post TMA treated with 6 weeks of antibiotics, history of DKA presented on 7/13 with lethargy, metabolic encephalopathy secondary to DKA glucose more than 1000, bicarb less than 7, pH 6.8, he was resuscitated aggressively in the ICU.  Gastroenterology evaluated patient secondary to emesis in the setting of anemia. 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 pylori was negative , pathology showed reactive gastropathy and metaplasia consistent with peptic duodenitis continue PPI twice daily until outpatient follow-up and endoscopy probably in 8 to 12 weeks Will need iron at discharge   Paroxysmal A-fib Continue metoprolol    AKI on admission from DKA resolved   Diarrhea: Metabolic acidosis: Okay to give him 1 or 2 Imodium 's Diarrhea improved. He feels better, also from gas. Acidosis improved. Would keep fluids for now.          Nutrition Problem: Inadequate oral intake Etiology: chronic illness (gastroparesis and uncontrolled diabetes)              Consultants: GI, Dr Harden Disposition: Skilled nursing facility Diet recommendation:  Discharge Diet Orders (From admission, onward)     Start  Ordered   03/04/24 0000  Diet - low sodium heart healthy        03/04/24 1128   03/01/24 0000  Diet - low sodium heart healthy        03/01/24 0959           Cardiac diet DISCHARGE  MEDICATION: Allergies as of 03/04/2024   No Known Allergies      Medication List     TAKE these medications    acetaminophen  500 MG tablet Commonly known as: TYLENOL  Take 2 tablets (1,000 mg total) by mouth every 8 (eight) hours.   acidophilus Caps capsule Take 2 capsules by mouth 3 (three) times daily.   amoxicillin  500 MG capsule Commonly known as: AMOXIL  Take 2 capsules (1,000 mg total) by mouth every 8 (eight) hours.   atorvastatin  20 MG tablet Commonly known as: LIPITOR Take 1 tablet (20 mg total) by mouth at bedtime.   ferrous sulfate  325 (65 FE) MG tablet Take 1 tablet (325 mg total) by mouth daily with breakfast.   FreeStyle Libre 3 Sensor Misc Use as directed .Place 1 sensor on the skin every 14 days. Use to check glucose continuously   gabapentin  300 MG capsule Commonly known as: NEURONTIN  Take 1 capsule (300 mg total) by mouth 3 (three) times daily. What changed: when to take this   insulin  aspart 100 UNIT/ML injection Commonly known as: novoLOG  Inject 10 Units into the skin 3 (three) times daily with meals.   insulin  aspart 100 UNIT/ML injection Commonly known as: novoLOG  Inject 0-15 Units into the skin 3 (three) times daily with meals. Filed action:Not Prescribed insulin  aspart (novoLOG ) injection 0-15 Units  0-15 Units, Subcutaneous, 3 times daily with meals, First dose on Sat 02/27/24 at 1200 Correction coverage: Moderate (average weight, post-op) CBG < 70: Implement Hypoglycemia Standing Orders and refer to Hypoglycemia Standing Orders sidebar report CBG 70 - 120: 0 units CBG 121 - 150: 2 units CBG 151 - 200: 3 units CBG 201 - 250: 5 units CBG 251 - 300: 8 units CBG 301 - 350: 11 units CBG 351 - 400: 15 units   insulin  glargine-yfgn 100 UNIT/ML injection Commonly known as: SEMGLEE  Inject 0.34 mLs (34 Units total) into the skin at bedtime.   metoprolol  tartrate 50 MG tablet Commonly known as: LOPRESSOR  Take 1 tablet (50 mg total) by mouth 2 (two)  times daily.   multivitamin with minerals Tabs tablet Take 1 tablet by mouth daily.   oxyCODONE  15 MG immediate release tablet Commonly known as: ROXICODONE  Take 1 tablet (15 mg total) by mouth every 4 (four) hours as needed for severe pain (pain score 7-10).   pantoprazole  40 MG tablet Commonly known as: PROTONIX  Take 1 tablet (40 mg total) by mouth daily.   simethicone  80 MG chewable tablet Commonly known as: MYLICON Chew 1 tablet (80 mg total) by mouth 4 (four) times daily as needed for flatulence.               Durable Medical Equipment  (From admission, onward)           Start     Ordered   02/25/24 1748  For home use only DME 4 wheeled rolling walker with seat  Once       Comments: Bariatric walker  Question:  Patient needs a walker to treat with the following condition  Answer:  Weakness   02/25/24 1748              Discharge  Care Instructions  (From admission, onward)           Start     Ordered   03/04/24 0000  Discharge wound care:       Comments: See above   03/04/24 1128   03/01/24 0000  Discharge wound care:       Comments: 02/29/24 0000    Negative Pressure Wound Therapy - Incisional      Comments: Attach the wound VAC dressing to a Prevena plus portable wound VAC pump at time of discharge.  02/29/24 0740    02/26/24 1604    Negative Pressure Wound Therapy - Incisional  Until discontinued       02/26/24 1603   02/22/24 0948    Wound care  See Comments      Comments: Cleanse wound to left lateral foot with VASHE (LAWSON # [id number]) and pat dry. Apply aquacel to open wound.  Cover with dry gauze.  Unna boot to lower legs to manage edema.   Change twice weekly.  02/22/24 0948   03/01/24 0959            Contact information for follow-up providers     Harden Jerona GAILS, MD Follow up in 1 week(s).   Specialty: Orthopedic Surgery Contact information: [address] [place] [address] [phone]              Contact  information for after-discharge care     Destination     [facility] .   Service: Skilled Nursing Contact information: [address] [place]. [place]  [address] [phone]                    Discharge Exam: Fredricka Weights   03/02/24 0500 03/03/24 0500 03/04/24 0634  Weight: (!) 140.3 kg (!) 142.3 kg (!) 142.3 kg  General NAD  Condition at discharge: stable  The results of significant diagnostics from this hospitalization (including imaging, microbiology, ancillary and laboratory) are listed below for reference.   Imaging Studies: MR FOOT LEFT WO CONTRAST Result Date: 02/23/2024 CLINICAL DATA:  History of diabetes and plantar foot wound. Concern for soft tissue infection and osteomyelitis. EXAM: MRI OF THE LEFT FOOT WITHOUT CONTRAST TECHNIQUE: Multiplanar, multisequence MR imaging of the left hindfoot was performed. No intravenous contrast was administered. COMPARISON:  Radiographs 02/21/2024 and 07/30/2018. MRI 11/11/2023 and 08/02/2018. FINDINGS: Technical note: Despite efforts by the technologist and patient, mild motion artifact is present on today's exam and could not be eliminated. This reduces exam sensitivity and specificity. Patient was unable to complete the examination. No post-contrast imaging obtained. Bones/Joint/Cartilage Chronic plantar midfoot soft tissue ulceration appears progressive compared with the most recent MRI. Associated underlying soft tissue findings are further described below. There is new mild T2 hyperintensity within the cuboid without definite T1 marrow signal abnormality or cortical destruction. 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 The distal tibia, distal fibula, talus and calcaneus are intact. No significant hindfoot joint effusions. Ligaments Chronic attenuation of the anterior talofibular ligament, similar to previous MRI. No acute ligamentous findings identified. Muscles and  Tendons Grossly stable mild distal Achilles tendinosis without tear. The medial flexor and anterior extensor tendons appear normal. Mild flattening of the peroneus brevis tendon at the level of the lateral malleolus with heterogeneous fluid surrounding the tendon more distally at the level of the cuboid, further described below. Mild generalized muscular atrophy and edema. Soft tissues As above, chronic soft tissue ulceration along  the plantar aspect of the midfoot which appears increased from previous MRI of 3 months ago. Lateral to the calcaneus, there is a new heterogeneous fluid collection which partially encases the distal peroneus brevis tendon and measures approximately 3.2 x 2.5 x 3.8 cm. No other focal fluid collections are identified. There is generalized soft tissue edema throughout the hindfoot. IMPRESSION: 1. Chronic soft tissue ulceration along the plantar aspect of the midfoot which appears progressive compared with previous MRI of 3 months ago. 2. New heterogeneous fluid collection lateral to the calcaneus which partially encases the distal peroneus brevis tendon, suspicious for an abscess or infected tenosynovitis. 3. New mild T2 hyperintensity within the cuboid without definite T1 marrow signal abnormality or cortical destruction, equivocal for early osteomyelitis versus reactive marrow edema. 4. 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 5. Generalized soft tissue edema throughout the hindfoot. Electronically Signed   By: Elsie Perone M.D.   On: 02/23/2024 14:52   ECHOCARDIOGRAM COMPLETE Result Date: 02/23/2024    ECHOCARDIOGRAM REPORT   Patient Name:   TRAVONE GEORG Date of Exam: 02/23/2024 Medical Rec #:  [id number]    Height:       72.0 in Accession #:    [id number]   Weight:       298.1 lb Date of Birth:  02-26-1971     BSA:          2.524 m Patient Age:    53 years     BP:           149/87 mmHg Patient Gender: M            HR:           100 bpm.  Exam Location:  Inpatient Procedure: 2D Echo, Cardiac Doppler, Color Doppler and Intracardiac            Opacification Agent (Both Spectral and Color Flow Doppler were            utilized during procedure). Indications:    Endocarditis  History:        Patient has prior history of Echocardiogram examinations. GERD,                 Arrythmias:Atrial Fibrillation; Risk Factors:Hypertension and                 Diabetes.  Sonographer:    Christiana Mbomeh Referring Phys: [id number] PRAVEEN MANNAM IMPRESSIONS  1. Left ventricular ejection fraction, by estimation, is 55 to 60%. The left ventricle has normal function. The left ventricle has no regional wall motion abnormalities. Left ventricular diastolic parameters were normal.  2. Right ventricular systolic function is normal. The right ventricular size is normal. There is mildly elevated pulmonary artery systolic pressure. The estimated right ventricular systolic pressure is 39.8 mmHg.  3. The mitral valve is normal in structure. No evidence of mitral valve regurgitation. No evidence of mitral stenosis.  4. The aortic valve is tricuspid. Aortic valve regurgitation is not visualized. No aortic stenosis is present.  5. The inferior vena cava is dilated in size with <50% respiratory variability, suggesting right atrial pressure of 15 mmHg. Conclusion(s)/Recommendation(s): No evidence of valvular vegetations on this transthoracic echocardiogram. Consider a transesophageal echocardiogram to exclude infective endocarditis if clinically indicated. FINDINGS  Left Ventricle: Left ventricular ejection fraction, by estimation, is 55 to 60%. The left ventricle has normal function. The left ventricle has no regional wall motion abnormalities. Definity  contrast agent was given IV to delineate  the left ventricular  endocardial borders. The left ventricular internal cavity size was normal in size. There is no left ventricular hypertrophy. Left ventricular diastolic parameters were normal.  Normal left ventricular filling pressure. Right Ventricle: The right ventricular size is normal. No increase in right ventricular wall thickness. Right ventricular systolic function is normal. There is mildly elevated pulmonary artery systolic pressure. The tricuspid regurgitant velocity is 2.49  m/s, and with an assumed right atrial pressure of 15 mmHg, the estimated right ventricular systolic pressure is 39.8 mmHg. Left Atrium: Left atrial size was normal in size. Right Atrium: Right atrial size was normal in size. Pericardium: There is no evidence of pericardial effusion. Mitral Valve: The mitral valve is normal in structure. No evidence of mitral valve regurgitation. No evidence of mitral valve stenosis. Tricuspid Valve: The tricuspid valve is normal in structure. Tricuspid valve regurgitation is not demonstrated. No evidence of tricuspid stenosis. Aortic Valve: The aortic valve is tricuspid. Aortic valve regurgitation is not visualized. No aortic stenosis is present. Aortic valve mean gradient measures 5.0 mmHg. Aortic valve peak gradient measures 10.8 mmHg. Aortic valve area, by VTI measures 4.15  cm. Pulmonic Valve: The pulmonic valve was not well visualized. Pulmonic valve regurgitation is not visualized. No evidence of pulmonic stenosis. Aorta: The aortic root and ascending aorta are structurally normal, with no evidence of dilitation. Venous: The inferior vena cava is dilated in size with less than 50% respiratory variability, suggesting right atrial pressure of 15 mmHg. IAS/Shunts: No atrial level shunt detected by color flow Doppler.  LEFT VENTRICLE PLAX 2D LVIDd:         5.00 cm   Diastology LVIDs:         3.30 cm   LV e' medial:    12.30 cm/s LV PW:         1.00 cm   LV E/e' medial:  7.9 LV IVS:        1.10 cm   LV e' lateral:   12.10 cm/s LVOT diam:     2.30 cm   LV E/e' lateral: 8.0 LV SV:         111 LV SV Index:   44 LVOT Area:     4.15 cm  RIGHT VENTRICLE             IVC RV S prime:     14.70  cm/s  IVC diam: 2.30 cm TAPSE (M-mode): 2.4 cm LEFT ATRIUM             Index        RIGHT ATRIUM           Index LA Vol (A2C):   62.0 ml 24.56 ml/m  RA Area:     16.70 cm LA Vol (A4C):   73.2 ml 29.00 ml/m  RA Volume:   41.20 ml  16.32 ml/m LA Biplane Vol: 68.9 ml 27.29 ml/m  AORTIC VALVE AV Area (Vmax):    2.74 cm AV Area (Vmean):   2.77 cm AV Area (VTI):     4.15 cm AV Vmax:           164.00 cm/s AV Vmean:          108.000 cm/s AV VTI:            0.267 m AV Peak Grad:      10.8 mmHg AV Mean Grad:      5.0 mmHg LVOT Vmax:         108.00 cm/s LVOT Vmean:  72.000 cm/s LVOT VTI:          0.267 m LVOT/AV VTI ratio: 1.00  AORTA Ao Root diam: 2.80 cm Ao Asc diam:  2.90 cm MITRAL VALVE               TRICUSPID VALVE MV Area (PHT): 5.66 cm    TR Peak grad:   24.8 mmHg MV Decel Time: 134 msec    TR Vmax:        249.00 cm/s MV E velocity: 96.90 cm/s MV A velocity: 99.20 cm/s  SHUNTS MV E/A ratio:  0.98        Systemic VTI:  0.27 m                            Systemic Diam: 2.30 cm Jerel Croitoru MD Electronically signed by Jerel Balding MD Signature Date/Time: 02/23/2024/2:04:17 PM    Final    CT Head Wo Contrast Result Date: 02/22/2024 CLINICAL DATA:  Headache, increasing frequency or severity EXAM: CT HEAD WITHOUT CONTRAST TECHNIQUE: Contiguous axial images were obtained from the base of the skull through the vertex without intravenous contrast. RADIATION DOSE REDUCTION: This exam was performed according to the departmental dose-optimization program which includes automated exposure control, adjustment of the mA and/or kV according to patient size and/or use of iterative reconstruction technique. COMPARISON:  None Available. FINDINGS: Brain: Normal anatomic configuration. No abnormal intra or extra-axial mass lesion or fluid collection. No abnormal mass effect or midline shift. No evidence of acute intracranial hemorrhage or infarct. Ventricular size is normal. Cerebellum unremarkable. Vascular:  Unremarkable Skull: Intact Sinuses/Orbits: Paranasal sinuses are clear. Orbits are unremarkable. Other: Mastoid air cells and middle ear cavities are clear. IMPRESSION: 1. Normal CT examination of the head. Electronically Signed   By: Dorethia Molt M.D.   On: 02/22/2024 00:08   DG Foot Complete Left Result Date: 02/21/2024 CLINICAL DATA:  Questionable sepsis EXAM: LEFT FOOT - COMPLETE 3+ VIEW COMPARISON:  MRI of the left foot 11/11/2023. FINDINGS: Fifth ray is absent. The mid and distal third phalanx are absent, a new finding. There is irregular cortical thickening along the lateral margin of the mid and proximal fourth and third metatarsals. There is no acute fracture or dislocation identified. There is diffuse soft tissue swelling of the foot. There is a plantar ulceration overlying the midfoot. Charcot joint arthropathy noted of the midfoot, similar to prior MRI. IMPRESSION: 1. The mid and distal third phalanx are absent, a new finding. 2. Irregular cortical thickening along the lateral margin of the mid and proximal fourth and third metatarsals. Findings are concerning for osteomyelitis. 3. Diffuse soft tissue swelling of the foot. 4. Plantar ulceration overlying the midfoot. 5. Charcot joint arthropathy of the midfoot, similar to prior MRI. Electronically Signed   By: Greig Pique M.D.   On: 02/21/2024 21:45   DG Chest Port 1 View Result Date: 02/21/2024 CLINICAL DATA:  Questionable sepsis EXAM: PORTABLE CHEST 1 VIEW COMPARISON:  Chest x-ray 11/10/2023 FINDINGS: There are increased interstitial markings centrally in both lungs. Costophrenic angles are clear. No pneumothorax. The heart is mildly enlarged, unchanged. No acute fractures are identified. IMPRESSION: Increased interstitial markings centrally in both lungs, which may represent pulmonary edema or atypical infection. Electronically Signed   By: Greig Pique M.D.   On: 02/21/2024 21:42    Microbiology: Results for orders placed or performed  during the hospital encounter of 02/21/24  Resp panel by RT-PCR (RSV, Flu  A&B, Covid) Anterior Nasal Swab     Status: None   Collection Time: 02/21/24  9:03 PM   Specimen: Anterior Nasal Swab  Result Value Ref Range Status   SARS Coronavirus 2 by RT PCR NEGATIVE NEGATIVE Final   Influenza A by PCR NEGATIVE NEGATIVE Final   Influenza B by PCR NEGATIVE NEGATIVE Final    Comment: (NOTE) The Xpert Xpress SARS-CoV-2/FLU/RSV plus assay is intended as an aid in the diagnosis of influenza from Nasopharyngeal swab specimens and should not be used as a sole basis for treatment. Nasal washings and aspirates are unacceptable for Xpert Xpress SARS-CoV-2/FLU/RSV testing.  Fact Sheet for Patients: BloggerCourse.com  Fact Sheet for Healthcare Providers: SeriousBroker.it  This test is not yet approved or cleared by the [place]  FDA and has been authorized for detection and/or diagnosis of SARS-CoV-2 by FDA under an Emergency Use Authorization (EUA). This EUA will remain in effect (meaning this test can be used) for the duration of the COVID-19 declaration under Section 564(b)(1) of the Act, 21 U.S.C. section 360bbb-3(b)(1), unless the authorization is terminated or revoked.     Resp Syncytial Virus by PCR NEGATIVE NEGATIVE Final    Comment: (NOTE) Fact Sheet for Patients: BloggerCourse.com  Fact Sheet for Healthcare Providers: SeriousBroker.it  This test is not yet approved or cleared by the [place]  FDA and has been authorized for detection and/or diagnosis of SARS-CoV-2 by FDA under an Emergency Use Authorization (EUA). This EUA will remain in effect (meaning this test can be used) for the duration of the COVID-19 declaration under Section 564(b)(1) of the Act, 21 U.S.C. section 360bbb-3(b)(1), unless the authorization is terminated or revoked.  Performed at [facility]  Lab, [address] [place], [place] [address]   Blood Culture (routine x 2)     Status: Abnormal   Collection Time: 02/21/24  9:03 PM   Specimen: BLOOD  Result Value Ref Range Status   Specimen Description BLOOD BLOOD RIGHT HAND  Final   Special Requests   Final    BOTTLES DRAWN AEROBIC AND ANAEROBIC Blood Culture results may not be optimal due to an inadequate volume of blood received in culture bottles   Culture  Setup Time   Final    GRAM POSITIVE COCCI IN CHAINS IN BOTH AEROBIC AND ANAEROBIC BOTTLES CRITICAL RESULT CALLED TO, READ BACK BY AND VERIFIED WITH: PHARMD EMILY S ON [id number] @1056  BY SM Performed at [facility], [address], [place], [place] [address]    Culture GROUP B STREP(S.AGALACTIAE)ISOLATED (A)  Final   Report Status 02/24/2024 FINAL  Final   Organism ID, Bacteria GROUP B STREP(S.AGALACTIAE)ISOLATED  Final      Susceptibility   Group b strep(s.agalactiae)isolated - MIC*    CLINDAMYCIN  >=1 RESISTANT Resistant     AMPICILLIN  <=0.25 SENSITIVE Sensitive     ERYTHROMYCIN >=8 RESISTANT Resistant     VANCOMYCIN  1 SENSITIVE Sensitive     CEFTRIAXONE  <=0.12 SENSITIVE Sensitive     LEVOFLOXACIN 0.5 SENSITIVE Sensitive     PENICILLIN  <=0.06 SENSITIVE Sensitive     * GROUP B STREP(S.AGALACTIAE)ISOLATED  Blood Culture ID Panel (Reflexed)     Status: Abnormal   Collection Time: 02/21/24  9:03 PM  Result Value Ref Range Status   Enterococcus faecalis NOT DETECTED NOT DETECTED Final   Enterococcus Faecium NOT DETECTED NOT DETECTED Final   Listeria monocytogenes NOT DETECTED NOT DETECTED Final   Staphylococcus species NOT DETECTED NOT DETECTED Final   Staphylococcus aureus (BCID) NOT  DETECTED NOT DETECTED Final   Staphylococcus epidermidis NOT DETECTED NOT DETECTED Final   Staphylococcus lugdunensis NOT DETECTED NOT DETECTED Final   Streptococcus species DETECTED (A) NOT DETECTED Final    Comment: CRITICAL RESULT CALLED TO, READ BACK BY AND VERIFIED WITH: PHARMD EMILY  S ON [id number] @1056  BY SM    Streptococcus agalactiae DETECTED (A) NOT DETECTED Final    Comment: CRITICAL RESULT CALLED TO, READ BACK BY AND VERIFIED WITH: PHARMD EMILY S ON [id number] @1056  BY SM    Streptococcus pneumoniae NOT DETECTED NOT DETECTED Final   Streptococcus pyogenes NOT DETECTED NOT DETECTED Final   A.calcoaceticus-baumannii NOT DETECTED NOT DETECTED Final   Bacteroides fragilis NOT DETECTED NOT DETECTED Final   Enterobacterales NOT DETECTED NOT DETECTED Final   Enterobacter cloacae complex NOT DETECTED NOT DETECTED Final   Escherichia coli NOT DETECTED NOT DETECTED Final   Klebsiella aerogenes NOT DETECTED NOT DETECTED Final   Klebsiella oxytoca NOT DETECTED NOT DETECTED Final   Klebsiella pneumoniae NOT DETECTED NOT DETECTED Final   Proteus species NOT DETECTED NOT DETECTED Final   Salmonella species NOT DETECTED NOT DETECTED Final   Serratia marcescens NOT DETECTED NOT DETECTED Final   Haemophilus influenzae NOT DETECTED NOT DETECTED Final   Neisseria meningitidis NOT DETECTED NOT DETECTED Final   Pseudomonas aeruginosa NOT DETECTED NOT DETECTED Final   Stenotrophomonas maltophilia NOT DETECTED NOT DETECTED Final   Candida albicans NOT DETECTED NOT DETECTED Final   Candida auris NOT DETECTED NOT DETECTED Final   Candida glabrata NOT DETECTED NOT DETECTED Final   Candida krusei NOT DETECTED NOT DETECTED Final   Candida parapsilosis NOT DETECTED NOT DETECTED Final   Candida tropicalis NOT DETECTED NOT DETECTED Final   Cryptococcus neoformans/gattii NOT DETECTED NOT DETECTED Final    Comment: Performed at [facility], [address] [place], [place] [address]  MRSA Next Gen by PCR, Nasal     Status: Abnormal   Collection Time: 02/21/24 11:25 PM   Specimen: Nasal Mucosa; Nasal Swab  Result Value Ref Range Status   MRSA by PCR Next Gen DETECTED (A) NOT DETECTED Final    Comment: RESULT CALLED TO, READ BACK BY AND VERIFIED WITH: WILLIAMSON RN 02/22/2024 @ 0205 BY  AB (NOTE) The GeneXpert MRSA Assay (FDA approved for NASAL specimens only), is one component of a comprehensive MRSA colonization surveillance program. It is not intended to diagnose MRSA infection nor to guide or monitor treatment for MRSA infections. Test performance is not FDA approved in patients less than 88 years old. Performed at [facility], [address] [place], [place] [address]   Culture, blood (Routine X 2) w Reflex to ID Panel     Status: None   Collection Time: 02/22/24 10:30 AM   Specimen: BLOOD RIGHT HAND  Result Value Ref Range Status   Specimen Description BLOOD RIGHT HAND  Final   Special Requests   Final    BOTTLES DRAWN AEROBIC AND ANAEROBIC Blood Culture adequate volume   Culture   Final    NO GROWTH 5 DAYS Performed at [facility], [address] [place], [place] [address]    Report Status 02/27/2024 FINAL  Final  Culture, blood (Routine X 2) w Reflex to ID Panel     Status: None   Collection Time: 02/22/24 10:30 AM   Specimen: BLOOD RIGHT HAND  Result Value Ref Range Status   Specimen Description BLOOD RIGHT HAND  Final   Special Requests  Final    BOTTLES DRAWN AEROBIC ONLY Blood Culture adequate volume   Culture   Final    NO GROWTH 5 DAYS Performed at [facility], [address], [place], [place] [address]    Report Status 02/27/2024 FINAL  Final    Labs: CBC: Recent Labs  Lab 02/26/24 1640 02/27/24 0400 02/28/24 0740 03/02/24 0852  WBC 9.2 10.4 7.1 8.2  HGB 8.7* 9.6* 10.0* 9.3*  HCT 28.6* 30.8* 33.0* 30.1*  MCV 87.2 85.8 89.2 87.5  PLT 331 344 366 347   Basic Metabolic Panel: Recent Labs  Lab 02/27/24 0400 02/28/24 0740 02/29/24 0650 03/02/24 0852 03/03/24 0503  NA 137 139 137 133* 135  K 4.6 3.8 3.6 4.3 4.4  CL 107 107 105 106 106  CO2 21* 21* 22 17* 20*  GLUCOSE 369* 230* 249* 334* 254*  BUN 21* 30* 26* 28* 28*  CREATININE 1.07 1.22 0.91 0.96 0.91  CALCIUM  8.2* 8.3* 8.1* 8.0* 8.0*   Liver  Function Tests: No results for input(s): AST, ALT, ALKPHOS, BILITOT, PROT, ALBUMIN  in the last 168 hours. CBG: Recent Labs  Lab 03/03/24 0819 03/03/24 1213 03/03/24 1627 03/03/24 2055 03/04/24 0846  GLUCAP 288* 211* 75 134* 278*    Discharge time spent: greater than 30 minutes.  Signed: Owen DELENA Lore, MD [facility] 03/04/2024

## 2024-03-04 NOTE — Discharge Instructions (Signed)

## 2024-03-04 NOTE — Hospital Discharge Follow-Up (Signed)
 Pt left with PTAR, vitals WNL and AVS was given to the staff. Pt A/O x 4. No other needs at this time.

## 2024-03-04 NOTE — Plan of Care (Signed)

## 2024-03-07 DIAGNOSIS — D649 Anemia, unspecified: Secondary | ICD-10-CM | POA: Diagnosis not present

## 2024-03-07 DIAGNOSIS — Z89512 Acquired absence of left leg below knee: Secondary | ICD-10-CM | POA: Diagnosis not present

## 2024-03-07 DIAGNOSIS — F4320 Adjustment disorder, unspecified: Secondary | ICD-10-CM | POA: Diagnosis not present

## 2024-03-07 DIAGNOSIS — M8620 Subacute osteomyelitis, unspecified site: Secondary | ICD-10-CM | POA: Diagnosis not present

## 2024-03-07 DIAGNOSIS — G9340 Encephalopathy, unspecified: Secondary | ICD-10-CM | POA: Diagnosis not present

## 2024-03-07 LAB — BLOOD GAS, VENOUS
Acid-base deficit: 25.2 mmol/L — ABNORMAL HIGH (ref 0.0–2.0)
Bicarbonate: 4.2 mmol/L — ABNORMAL LOW (ref 20.0–28.0)
O2 Saturation: 87.3 %
Patient temperature: 34.7
pCO2, Ven: <18 mmHg — CL (ref 44–60)
pH, Ven: 7.03 — CL (ref 7.25–7.43)
pO2, Ven: 47 mmHg — ABNORMAL HIGH (ref 32–45)

## 2024-03-08 DIAGNOSIS — Z6841 Body Mass Index (BMI) 40.0 and over, adult: Secondary | ICD-10-CM | POA: Diagnosis not present

## 2024-03-08 DIAGNOSIS — M6281 Muscle weakness (generalized): Secondary | ICD-10-CM | POA: Diagnosis not present

## 2024-03-08 DIAGNOSIS — L97829 Non-pressure chronic ulcer of other part of left lower leg with unspecified severity: Secondary | ICD-10-CM | POA: Diagnosis not present

## 2024-03-08 DIAGNOSIS — E1165 Type 2 diabetes mellitus with hyperglycemia: Secondary | ICD-10-CM | POA: Diagnosis not present

## 2024-03-10 DIAGNOSIS — R197 Diarrhea, unspecified: Secondary | ICD-10-CM | POA: Diagnosis not present

## 2024-03-10 DIAGNOSIS — E11649 Type 2 diabetes mellitus with hypoglycemia without coma: Secondary | ICD-10-CM | POA: Diagnosis not present

## 2024-03-10 DIAGNOSIS — Z89512 Acquired absence of left leg below knee: Secondary | ICD-10-CM | POA: Diagnosis not present

## 2024-03-11 DIAGNOSIS — Z89512 Acquired absence of left leg below knee: Secondary | ICD-10-CM | POA: Diagnosis not present

## 2024-03-11 DIAGNOSIS — K2980 Duodenitis without bleeding: Secondary | ICD-10-CM | POA: Diagnosis not present

## 2024-03-11 DIAGNOSIS — B951 Streptococcus, group B, as the cause of diseases classified elsewhere: Secondary | ICD-10-CM | POA: Diagnosis not present

## 2024-03-11 DIAGNOSIS — D509 Iron deficiency anemia, unspecified: Secondary | ICD-10-CM | POA: Diagnosis not present

## 2024-03-11 DIAGNOSIS — K208 Other esophagitis without bleeding: Secondary | ICD-10-CM | POA: Diagnosis not present

## 2024-03-11 DIAGNOSIS — M6281 Muscle weakness (generalized): Secondary | ICD-10-CM | POA: Diagnosis not present

## 2024-03-11 DIAGNOSIS — R7881 Bacteremia: Secondary | ICD-10-CM | POA: Diagnosis not present

## 2024-03-11 DIAGNOSIS — Z4781 Encounter for orthopedic aftercare following surgical amputation: Secondary | ICD-10-CM | POA: Diagnosis not present

## 2024-03-11 DIAGNOSIS — Z8614 Personal history of Methicillin resistant Staphylococcus aureus infection: Secondary | ICD-10-CM | POA: Diagnosis not present

## 2024-03-11 DIAGNOSIS — I10 Essential (primary) hypertension: Secondary | ICD-10-CM | POA: Diagnosis not present

## 2024-03-11 DIAGNOSIS — E1165 Type 2 diabetes mellitus with hyperglycemia: Secondary | ICD-10-CM | POA: Diagnosis not present

## 2024-03-11 DIAGNOSIS — E1110 Type 2 diabetes mellitus with ketoacidosis without coma: Secondary | ICD-10-CM | POA: Diagnosis not present

## 2024-03-11 DIAGNOSIS — D649 Anemia, unspecified: Secondary | ICD-10-CM | POA: Diagnosis not present

## 2024-03-15 DIAGNOSIS — E1165 Type 2 diabetes mellitus with hyperglycemia: Secondary | ICD-10-CM | POA: Diagnosis not present

## 2024-03-15 DIAGNOSIS — Z6841 Body Mass Index (BMI) 40.0 and over, adult: Secondary | ICD-10-CM | POA: Diagnosis not present

## 2024-03-15 DIAGNOSIS — M6281 Muscle weakness (generalized): Secondary | ICD-10-CM | POA: Diagnosis not present

## 2024-03-15 DIAGNOSIS — L97829 Non-pressure chronic ulcer of other part of left lower leg with unspecified severity: Secondary | ICD-10-CM | POA: Diagnosis not present

## 2024-03-16 DIAGNOSIS — Z89512 Acquired absence of left leg below knee: Secondary | ICD-10-CM | POA: Diagnosis not present

## 2024-03-16 DIAGNOSIS — R2242 Localized swelling, mass and lump, left lower limb: Secondary | ICD-10-CM | POA: Diagnosis not present

## 2024-03-16 DIAGNOSIS — E119 Type 2 diabetes mellitus without complications: Secondary | ICD-10-CM | POA: Diagnosis not present

## 2024-03-16 DIAGNOSIS — I10 Essential (primary) hypertension: Secondary | ICD-10-CM | POA: Diagnosis not present

## 2024-03-17 DIAGNOSIS — Z89512 Acquired absence of left leg below knee: Secondary | ICD-10-CM | POA: Diagnosis not present

## 2024-03-17 DIAGNOSIS — E119 Type 2 diabetes mellitus without complications: Secondary | ICD-10-CM | POA: Diagnosis not present

## 2024-03-17 DIAGNOSIS — D649 Anemia, unspecified: Secondary | ICD-10-CM | POA: Diagnosis not present

## 2024-03-17 DIAGNOSIS — I10 Essential (primary) hypertension: Secondary | ICD-10-CM | POA: Diagnosis not present

## 2024-03-18 ENCOUNTER — Ambulatory Visit: Admitting: Family

## 2024-03-18 DIAGNOSIS — S88112A Complete traumatic amputation at level between knee and ankle, left lower leg, initial encounter: Secondary | ICD-10-CM

## 2024-03-18 DIAGNOSIS — I890 Lymphedema, not elsewhere classified: Secondary | ICD-10-CM

## 2024-03-18 DIAGNOSIS — Z89512 Acquired absence of left leg below knee: Secondary | ICD-10-CM

## 2024-03-18 NOTE — Progress Notes (Signed)
 Post-Op Visit Note   Patient: Taylor Hughes           Date of Birth: 1970-10-19           MRN: [id number] Visit Date: 03/18/2024 PCP: Delbert Clam, MD  Chief Complaint:  Chief Complaint  Patient presents with   Left Leg - Routine Post Op    02/26/2024 left BKA    HPI:  HPI The patient is a 53 year old gentleman who is seen status post left below-knee amputation.  He is currently residing at our emergency in [facility].  His current shrinker is still fitting causing a pressure injury in the popliteal fossa  Patient is a new left transtibial  amputee.  Patient's current comorbidities are not expected to impact the ability to function with the prescribed prosthesis. Patient verbally communicates a strong desire to use a prosthesis. Patient currently requires mobility aids to ambulate without a prosthesis.  Expects not to use mobility aids with a new prosthesis.  Patient is a K3 level ambulator that spends a lot of time walking around on uneven terrain over obstacles, up and down stairs, and ambulates with a variable cadence.    Ortho Exam On examination left residual limb this is well-approximated staples appears to be healing well no gaping or drainage no erythema no signs of infection Visit Diagnoses: No diagnosis found.  Plan: Given an order for his prosthesis set up.  Please call for appointment may require new shrinker sizing plan to follow-up in 2 weeks  Follow-Up Instructions: No follow-ups on file.   Imaging: No results found.  Orders:  No orders of the defined types were placed in this encounter.  No orders of the defined types were placed in this encounter.    PMFS History: Patient Active Problem List   Diagnosis Date Noted   Cutaneous abscess of left foot 02/25/2024   Erosive esophagitis 02/25/2024   Duodenitis 02/25/2024   Iron deficiency anemia 02/24/2024   Coffee ground emesis 02/23/2024   Encephalopathy acute 02/22/2024   Non-pressure chronic  ulcer of other part of left foot limited to breakdown of skin (HCC) 11/13/2023   Charcot joint of left foot 11/13/2023   Acute pyelonephritis 11/11/2023   DKA, type 2 (HCC) 11/10/2023   Flank pain 11/10/2023   Left thigh pain 11/10/2023   Neck pain 11/10/2023   Elevated troponin 11/10/2023   Lactic acidosis 11/10/2023   Acute cystitis with hematuria 06/25/2023   History of transmetatarsal amputation of right foot (HCC) 04/27/2023   Lymphedema 03/24/2023   Right ventricular enlargement 03/24/2023   Diabetic foot infection (HCC) 03/23/2023   Subacute osteomyelitis, left ankle and foot (HCC) 03/22/2023   Atrial fibrillation, chronic (HCC) 03/22/2023   HLD (hyperlipidemia) 03/22/2023   Gastroparesis 03/22/2023   Depression 09/08/2022   GERD (gastroesophageal reflux disease) 03/03/2022   Diabetes mellitus (HCC) 09/19/2021   PAF (paroxysmal atrial fibrillation) (HCC)    Acute on chronic anemia    Venous insufficiency (chronic) (peripheral) 03/15/2019   Edema of both lower extremities due to peripheral venous insufficiency 03/15/2019   Essential hypertension 11/23/2018   AKI (acute kidney injury) (HCC) 11/23/2018   Morbid obesity with BMI of 45.0-49.9, adult (HCC) 11/23/2018   Type 2 diabetes mellitus with other specified complication (HCC) 11/23/2018   Diabetic infection of left foot (HCC)    DKA (diabetic ketoacidosis) (HCC) 07/30/2018   DMII (diabetes mellitus, type 2) (HCC) 07/28/2018   Past Medical History:  Diagnosis Date   Atrial fibrillation (HCC)  Diabetes mellitus without complication (HCC)    Type 2   GERD (gastroesophageal reflux disease)    Hyperlipidemia    Hypertension    Morbid (severe) obesity due to excess calories (HCC) 08/07/2023   bmi 44.63   Wears glasses    Wound, open, foot    left diabetic     Family History  Problem Relation Age of Onset   Diabetes Mother    Colon cancer Brother    Esophageal cancer Neg Hx    Rectal cancer Neg Hx    Stomach  cancer Neg Hx     Past Surgical History:  Procedure Laterality Date   AMPUTATION Left 08/20/2018   Procedure: LEFT FOOT IRRIGATON AND DEBRIDEMENT, 5TH RAY AMPUTATION;  Surgeon: Harden Jerona GAILS, MD;  Location: [facility] OR;  Service: Orthopedics;  Laterality: Left;   AMPUTATION Right 03/25/2023   Procedure: RIGHT TRANSMETATARSAL AMPUTATION;  Surgeon: Harden Jerona GAILS, MD;  Location: [facility] OR;  Service: Orthopedics;  Laterality: Right;   AMPUTATION Left 02/26/2024   Procedure: AMPUTATION BELOW KNEE, LEFT;  Surgeon: Harden Jerona GAILS, MD;  Location[facility] OR;  Service: Orthopedics;  Laterality: Left;  WOUND VAC APPLIED   APPLICATION OF A-CELL OF CHEST/ABDOMEN N/A 05/30/2019   Procedure: Application Of A-Cell Of Chest;  Surgeon: Shyrl Linnie KIDD, MD;  Location: [facility] OR;  Service: Cardiothoracic;  Laterality: N/A;   APPLICATION OF WOUND VAC N/A 05/30/2019   Procedure: Application Of Wound Vac;  Surgeon: Shyrl Linnie KIDD, MD;  Location: [facility] OR;  Service: Cardiothoracic;  Laterality: N/A;   APPLICATION OF WOUND VAC Left 02/26/2024   Procedure: APPLICATION, WOUND VAC;  Surgeon: Harden Jerona GAILS, MD;  Location: [facility] OR;  Service: Orthopedics;  Laterality: Left;   ESOPHAGOGASTRODUODENOSCOPY N/A 02/25/2024   Procedure: EGD (ESOPHAGOGASTRODUODENOSCOPY);  Surgeon: Suzann Inocente HERO, MD;  Location: [facility] ENDOSCOPY;  Service: Gastroenterology;  Laterality: N/A;   FINGER SURGERY Right 2016   I&D  small finger   I & D EXTREMITY Left 07/30/2018   Procedure: IRRIGATION AND DEBRIDEMENT LEFT FOOT WITH POSSIBLE AMPUTATION OF FIFTH TOE;  Surgeon: Vernetta Lonni GRADE, MD;  Location: [facility] ORS;  Service: Orthopedics;  Laterality: Left;   INCISION AND DRAINAGE ABSCESS Left 01/19/2019   Procedure: INCISION AND DRAINAGE ABSCESS upper chest;  Surgeon: Carlie Clark, MD;  Location: [facility] OR;  Service: ENT;  Laterality: Left;   IRRIGATION AND DEBRIDEMENT STERNOCLAVICULAR JOINT-STERNUM AND RIBS N/A 05/30/2019   Procedure: IRRIGATION AND DEBRIDEMENT OF  STERNOCLAVICULAR JOINT-STERNUM AND RIBS ;  Surgeon: Shyrl Linnie KIDD, MD;  Location: [facility] OR;  Service: Cardiothoracic;  Laterality: N/A;   MINOR IRRIGATION AND DEBRIDEMENT OF WOUND N/A 11/22/2018   Procedure: INCISION AND DRAINAGE OF NECK ABSCESS;  Surgeon: Carlie Clark, MD;  Location: [facility] ORS;  Service: ENT;  Laterality: N/A;   Social History   Occupational History   Not on file  Tobacco Use   Smoking status: Never   Smokeless tobacco: Never  Vaping Use   Vaping status: Never Used  Substance and Sexual Activity   Alcohol use: No    Alcohol/week: 0.0 standard drinks of alcohol   Drug use: No   Sexual activity: Not on file

## 2024-03-21 DIAGNOSIS — D649 Anemia, unspecified: Secondary | ICD-10-CM | POA: Diagnosis not present

## 2024-03-21 DIAGNOSIS — Z89512 Acquired absence of left leg below knee: Secondary | ICD-10-CM | POA: Diagnosis not present

## 2024-03-21 DIAGNOSIS — I10 Essential (primary) hypertension: Secondary | ICD-10-CM | POA: Diagnosis not present

## 2024-03-21 DIAGNOSIS — E119 Type 2 diabetes mellitus without complications: Secondary | ICD-10-CM | POA: Diagnosis not present

## 2024-03-21 DIAGNOSIS — F4320 Adjustment disorder, unspecified: Secondary | ICD-10-CM | POA: Diagnosis not present

## 2024-03-22 DIAGNOSIS — E119 Type 2 diabetes mellitus without complications: Secondary | ICD-10-CM | POA: Diagnosis not present

## 2024-03-22 DIAGNOSIS — K3184 Gastroparesis: Secondary | ICD-10-CM | POA: Diagnosis not present

## 2024-03-22 DIAGNOSIS — Z419 Encounter for procedure for purposes other than remedying health state, unspecified: Secondary | ICD-10-CM | POA: Diagnosis not present

## 2024-03-22 DIAGNOSIS — R7881 Bacteremia: Secondary | ICD-10-CM | POA: Diagnosis not present

## 2024-03-22 DIAGNOSIS — D649 Anemia, unspecified: Secondary | ICD-10-CM | POA: Diagnosis not present

## 2024-03-22 DIAGNOSIS — E1110 Type 2 diabetes mellitus with ketoacidosis without coma: Secondary | ICD-10-CM | POA: Diagnosis not present

## 2024-03-22 DIAGNOSIS — Z89512 Acquired absence of left leg below knee: Secondary | ICD-10-CM | POA: Diagnosis not present

## 2024-03-22 DIAGNOSIS — E1165 Type 2 diabetes mellitus with hyperglycemia: Secondary | ICD-10-CM | POA: Diagnosis not present

## 2024-03-22 DIAGNOSIS — D509 Iron deficiency anemia, unspecified: Secondary | ICD-10-CM | POA: Diagnosis not present

## 2024-03-29 ENCOUNTER — Inpatient Hospital Stay: Payer: Self-pay | Admitting: Internal Medicine

## 2024-03-29 DIAGNOSIS — M6281 Muscle weakness (generalized): Secondary | ICD-10-CM | POA: Diagnosis not present

## 2024-03-29 DIAGNOSIS — Z6841 Body Mass Index (BMI) 40.0 and over, adult: Secondary | ICD-10-CM | POA: Diagnosis not present

## 2024-03-29 DIAGNOSIS — E1165 Type 2 diabetes mellitus with hyperglycemia: Secondary | ICD-10-CM | POA: Diagnosis not present

## 2024-03-29 DIAGNOSIS — L97829 Non-pressure chronic ulcer of other part of left lower leg with unspecified severity: Secondary | ICD-10-CM | POA: Diagnosis not present

## 2024-04-01 ENCOUNTER — Ambulatory Visit: Admitting: Family

## 2024-04-01 DIAGNOSIS — Z89512 Acquired absence of left leg below knee: Secondary | ICD-10-CM

## 2024-04-01 DIAGNOSIS — S88112A Complete traumatic amputation at level between knee and ankle, left lower leg, initial encounter: Secondary | ICD-10-CM

## 2024-04-01 DIAGNOSIS — I890 Lymphedema, not elsewhere classified: Secondary | ICD-10-CM

## 2024-04-01 NOTE — Progress Notes (Signed)
 Post-Op Visit Note   Patient: Taylor Hughes           Date of Birth: 01-13-1971           MRN: [id number] Visit Date: 04/01/2024 PCP: Delbert Clam, MD  Chief Complaint:  Chief Complaint  Patient presents with   Left Leg - Routine Post Op    02/26/2024 left BKA    HPI:  HPI The patient is a 53 year old gentleman seen status post left below-knee amputation.  [facility]  He shrinker rolling down on the palpable to fossa Ortho Exam Examination left below-knee amputation this is well-healed.  No drainage.  Remaining staples are.  Medial popliteal fossa does have a dime size pressure sore this is filled in with full fibrinous exudative tissue there is no sign of infection  Visit Diagnoses: No diagnosis found.  Plan: Given another order for his prosthesis set up.  Instructed on proper wear of the shrinker to prevent rolling and to the fossa.  Remaining staples harvested.  He will follow-up in the office in 3 months.  Follow-Up Instructions: No follow-ups on file.   Imaging: No results found.  Orders:  No orders of the defined types were placed in this encounter.  No orders of the defined types were placed in this encounter.    PMFS History: Patient Active Problem List   Diagnosis Date Noted   Cutaneous abscess of left foot 02/25/2024   Erosive esophagitis 02/25/2024   Duodenitis 02/25/2024   Iron deficiency anemia 02/24/2024   Coffee ground emesis 02/23/2024   Encephalopathy acute 02/22/2024   Non-pressure chronic ulcer of other part of left foot limited to breakdown of skin (HCC) 11/13/2023   Charcot joint of left foot 11/13/2023   Acute pyelonephritis 11/11/2023   DKA, type 2 (HCC) 11/10/2023   Flank pain 11/10/2023   Left thigh pain 11/10/2023   Neck pain 11/10/2023   Elevated troponin 11/10/2023   Lactic acidosis 11/10/2023   Acute cystitis with hematuria 06/25/2023   History of transmetatarsal amputation of right foot (HCC) 04/27/2023    Lymphedema 03/24/2023   Right ventricular enlargement 03/24/2023   Diabetic foot infection (HCC) 03/23/2023   Subacute osteomyelitis, left ankle and foot (HCC) 03/22/2023   Atrial fibrillation, chronic (HCC) 03/22/2023   HLD (hyperlipidemia) 03/22/2023   Gastroparesis 03/22/2023   Depression 09/08/2022   GERD (gastroesophageal reflux disease) 03/03/2022   Diabetes mellitus (HCC) 09/19/2021   PAF (paroxysmal atrial fibrillation) (HCC)    Acute on chronic anemia    Venous insufficiency (chronic) (peripheral) 03/15/2019   Edema of both lower extremities due to peripheral venous insufficiency 03/15/2019   Essential hypertension 11/23/2018   AKI (acute kidney injury) (HCC) 11/23/2018   Morbid obesity with BMI of 45.0-49.9, adult (HCC) 11/23/2018   Type 2 diabetes mellitus with other specified complication (HCC) 11/23/2018   Diabetic infection of left foot (HCC)    DKA (diabetic ketoacidosis) (HCC) 07/30/2018   DMII (diabetes mellitus, type 2) (HCC) 07/28/2018   Past Medical History:  Diagnosis Date   Atrial fibrillation (HCC)    Diabetes mellitus without complication (HCC)    Type 2   GERD (gastroesophageal reflux disease)    Hyperlipidemia    Hypertension    Morbid (severe) obesity due to excess calories (HCC) 08/07/2023   bmi 44.63   Wears glasses    Wound, open, foot    left diabetic     Family History  Problem Relation Age of Onset   Diabetes  Mother    Colon cancer Brother    Esophageal cancer Neg Hx    Rectal cancer Neg Hx    Stomach cancer Neg Hx     Past Surgical History:  Procedure Laterality Date   AMPUTATION Left 08/20/2018   Procedure: LEFT FOOT IRRIGATON AND DEBRIDEMENT, 5TH RAY AMPUTATION;  Surgeon: Harden Jerona GAILS, MD;  [facility] OR;  Service: Orthopedics;  Laterality: Left;   AMPUTATION Right 03/25/2023   Procedure: RIGHT TRANSMETATARSAL AMPUTATION;  Surgeon: Harden Jerona GAILS, MD;  Location: [facility] OR;  Service: Orthopedics;  Laterality: Right;   AMPUTATION  Left 02/26/2024   Procedure: AMPUTATION BELOW KNEE, LEFT;  Surgeon: Harden Jerona GAILS, MD;  Location: [facility] OR;  Service: Orthopedics;  Laterality: Left;  WOUND VAC APPLIED   APPLICATION OF A-CELL OF CHEST/ABDOMEN N/A 05/30/2019   Procedure: Application Of A-Cell Of Chest;  Surgeon: Shyrl Linnie KIDD, MD;  Location: [facility] OR;  Service: Cardiothoracic;  Laterality: N/A;   APPLICATION OF WOUND VAC N/A 05/30/2019   Procedure: Application Of Wound Vac;  Surgeon: Shyrl Linnie KIDD, MD;  Location: [facility] OR;  Service: Cardiothoracic;  Laterality: N/A;   APPLICATION OF WOUND VAC Left 02/26/2024   Procedure: APPLICATION, WOUND VAC;  Surgeon: Harden Jerona GAILS, MD;  Location[facility] OR;  Service: Orthopedics;  Laterality: Left;   ESOPHAGOGASTRODUODENOSCOPY N/A 02/25/2024   Procedure: EGD (ESOPHAGOGASTRODUODENOSCOPY);  Surgeon: Suzann Inocente HERO, MD;  Location: [facility] ENDOSCOPY;  Service: Gastroenterology;  Laterality: N/A;   FINGER SURGERY Right 2016   I&D  small finger   I & D EXTREMITY Left 07/30/2018   Procedure: IRRIGATION AND DEBRIDEMENT LEFT FOOT WITH POSSIBLE AMPUTATION OF FIFTH TOE;  Surgeon: Vernetta Lonni GRADE, MD;  Location[facility];  Service: Orthopedics;  Laterality: Left;   INCISION AND DRAINAGE ABSCESS Left 01/19/2019   Procedure: INCISION AND DRAINAGE ABSCESS upper chest;  Surgeon: Carlie Clark, MD;  Location: [facility] OR;  Service: ENT;  Laterality: Left;   IRRIGATION AND DEBRIDEMENT STERNOCLAVICULAR JOINT-STERNUM AND RIBS N/A 05/30/2019   Procedure: IRRIGATION AND DEBRIDEMENT OF STERNOCLAVICULAR JOINT-STERNUM AND RIBS ;  Surgeon: Shyrl Linnie KIDD, MD;  Location: [facility] OR;  Service: Cardiothoracic;  Laterality: N/A;   MINOR IRRIGATION AND DEBRIDEMENT OF WOUND N/A 11/22/2018   Procedure: INCISION AND DRAINAGE OF NECK ABSCESS;  Surgeon: Carlie Clark, MD;  Location: [facility]S;  Service: ENT;  Laterality: N/A;   Social History   Occupational History   Not on file  Tobacco Use   Smoking status: Never   Smokeless  tobacco: Never  Vaping Use   Vaping status: Never Used  Substance and Sexual Activity   Alcohol use: No    Alcohol/week: 0.0 standard drinks of alcohol   Drug use: No   Sexual activity: Not on file

## 2024-04-05 ENCOUNTER — Other Ambulatory Visit: Payer: Self-pay | Admitting: Physician Assistant

## 2024-04-05 ENCOUNTER — Other Ambulatory Visit: Payer: Self-pay | Admitting: Family Medicine

## 2024-04-05 ENCOUNTER — Other Ambulatory Visit: Payer: Self-pay

## 2024-04-05 DIAGNOSIS — D509 Iron deficiency anemia, unspecified: Secondary | ICD-10-CM | POA: Diagnosis not present

## 2024-04-05 DIAGNOSIS — L97829 Non-pressure chronic ulcer of other part of left lower leg with unspecified severity: Secondary | ICD-10-CM | POA: Diagnosis not present

## 2024-04-05 DIAGNOSIS — I480 Paroxysmal atrial fibrillation: Secondary | ICD-10-CM | POA: Diagnosis not present

## 2024-04-05 DIAGNOSIS — E1142 Type 2 diabetes mellitus with diabetic polyneuropathy: Secondary | ICD-10-CM

## 2024-04-05 DIAGNOSIS — D649 Anemia, unspecified: Secondary | ICD-10-CM | POA: Diagnosis not present

## 2024-04-05 DIAGNOSIS — E1165 Type 2 diabetes mellitus with hyperglycemia: Secondary | ICD-10-CM | POA: Diagnosis not present

## 2024-04-05 DIAGNOSIS — Z6841 Body Mass Index (BMI) 40.0 and over, adult: Secondary | ICD-10-CM | POA: Diagnosis not present

## 2024-04-05 DIAGNOSIS — M6281 Muscle weakness (generalized): Secondary | ICD-10-CM | POA: Diagnosis not present

## 2024-04-05 DIAGNOSIS — I10 Essential (primary) hypertension: Secondary | ICD-10-CM

## 2024-04-05 DIAGNOSIS — E119 Type 2 diabetes mellitus without complications: Secondary | ICD-10-CM | POA: Diagnosis not present

## 2024-04-05 DIAGNOSIS — K219 Gastro-esophageal reflux disease without esophagitis: Secondary | ICD-10-CM

## 2024-04-06 ENCOUNTER — Other Ambulatory Visit: Payer: Self-pay

## 2024-04-06 ENCOUNTER — Telehealth: Payer: Self-pay

## 2024-04-06 ENCOUNTER — Other Ambulatory Visit: Payer: Self-pay | Admitting: Pharmacist

## 2024-04-06 ENCOUNTER — Other Ambulatory Visit: Payer: Self-pay | Admitting: Family Medicine

## 2024-04-06 DIAGNOSIS — L02612 Cutaneous abscess of left foot: Secondary | ICD-10-CM | POA: Diagnosis not present

## 2024-04-06 DIAGNOSIS — Z4781 Encounter for orthopedic aftercare following surgical amputation: Secondary | ICD-10-CM | POA: Diagnosis not present

## 2024-04-06 DIAGNOSIS — R7881 Bacteremia: Secondary | ICD-10-CM | POA: Diagnosis not present

## 2024-04-06 DIAGNOSIS — M14672 Charcot's joint, left ankle and foot: Secondary | ICD-10-CM | POA: Diagnosis not present

## 2024-04-06 DIAGNOSIS — M6281 Muscle weakness (generalized): Secondary | ICD-10-CM | POA: Diagnosis not present

## 2024-04-06 DIAGNOSIS — Z89431 Acquired absence of right foot: Secondary | ICD-10-CM | POA: Diagnosis not present

## 2024-04-06 DIAGNOSIS — R531 Weakness: Secondary | ICD-10-CM | POA: Diagnosis not present

## 2024-04-06 MED ORDER — INSULIN LISPRO (1 UNIT DIAL) 100 UNIT/ML (KWIKPEN)
0.0000 [IU] | PEN_INJECTOR | Freq: Three times a day (TID) | SUBCUTANEOUS | 3 refills | Status: AC
  Filled 2024-04-06: qty 12, 34d supply, fill #0
  Filled 2024-05-12: qty 12, 34d supply, fill #1
  Filled 2024-06-06: qty 12, 34d supply, fill #2
  Filled 2024-07-19: qty 12, 34d supply, fill #3

## 2024-04-06 MED ORDER — FREESTYLE LIBRE 3 PLUS SENSOR MISC
1 refills | Status: DC
  Filled 2024-04-06 – 2024-04-12 (×4): qty 2, 30d supply, fill #0

## 2024-04-06 MED ORDER — INSULIN ASPART 100 UNIT/ML IJ SOLN
0.0000 [IU] | Freq: Three times a day (TID) | INTRAMUSCULAR | 0 refills | Status: DC
  Filled 2024-04-06: qty 10, 28d supply, fill #0

## 2024-04-06 MED ORDER — ACCU-CHEK SOFTCLIX LANCETS MISC
6 refills | Status: AC
  Filled 2024-04-06: qty 100, 33d supply, fill #0
  Filled 2024-05-12: qty 100, 33d supply, fill #1
  Filled 2024-06-06 – 2024-07-19 (×5): qty 100, 33d supply, fill #2

## 2024-04-06 MED ORDER — METOPROLOL TARTRATE 50 MG PO TABS
50.0000 mg | ORAL_TABLET | Freq: Two times a day (BID) | ORAL | 0 refills | Status: DC
  Filled 2024-04-06: qty 60, 30d supply, fill #0

## 2024-04-06 MED ORDER — ATORVASTATIN CALCIUM 20 MG PO TABS
20.0000 mg | ORAL_TABLET | Freq: Every day | ORAL | 0 refills | Status: DC
  Filled 2024-04-06: qty 30, 30d supply, fill #0

## 2024-04-06 MED ORDER — ACCU-CHEK GUIDE TEST VI STRP
1.0000 | ORAL_STRIP | Freq: Three times a day (TID) | 5 refills | Status: DC
  Filled 2024-04-06: qty 100, 34d supply, fill #0
  Filled 2024-05-12: qty 100, 34d supply, fill #1
  Filled 2024-06-06: qty 100, 34d supply, fill #2
  Filled 2024-07-12 – 2024-07-22 (×3): qty 100, 34d supply, fill #3

## 2024-04-06 MED ORDER — LANTUS SOLOSTAR 100 UNIT/ML ~~LOC~~ SOPN
27.0000 [IU] | PEN_INJECTOR | Freq: Every day | SUBCUTANEOUS | 1 refills | Status: AC
  Filled 2024-04-06: qty 30, 111d supply, fill #0
  Filled 2024-05-15: qty 24, 88d supply, fill #0
  Filled 2024-08-12: qty 24, 88d supply, fill #1

## 2024-04-06 MED ORDER — GABAPENTIN 300 MG PO CAPS
300.0000 mg | ORAL_CAPSULE | Freq: Three times a day (TID) | ORAL | 0 refills | Status: DC
  Filled 2024-04-06: qty 90, 30d supply, fill #0

## 2024-04-06 MED ORDER — INSULIN GLARGINE-YFGN 100 UNIT/ML ~~LOC~~ SOLN
34.0000 [IU] | Freq: Every day | SUBCUTANEOUS | 0 refills | Status: DC
  Filled 2024-04-06: qty 10, 29d supply, fill #0

## 2024-04-06 MED ORDER — FERROUS SULFATE 325 (65 FE) MG PO TABS
325.0000 mg | ORAL_TABLET | Freq: Every day | ORAL | 0 refills | Status: DC
  Filled 2024-04-06: qty 30, 30d supply, fill #0

## 2024-04-06 MED ORDER — PANTOPRAZOLE SODIUM 40 MG PO TBEC
40.0000 mg | DELAYED_RELEASE_TABLET | Freq: Every day | ORAL | 0 refills | Status: DC
  Filled 2024-04-06: qty 30, 30d supply, fill #0

## 2024-04-06 MED ORDER — VALSARTAN 160 MG PO TABS
160.0000 mg | ORAL_TABLET | Freq: Every day | ORAL | 0 refills | Status: DC
  Filled 2024-04-06: qty 30, 30d supply, fill #0

## 2024-04-06 NOTE — Telephone Encounter (Signed)
 All requested refills have been sent to the pharmacy

## 2024-04-06 NOTE — Telephone Encounter (Signed)
 Copied from CRM [id number]. Topic: Clinical - Medication Question >> Apr 06, 2024  4:30 PM Kevelyn M wrote: Reason for CRM: Continuous Glucose Sensor (FREESTYLE LIBRE 3 PLUS SENSOR) MISC Is asking if he's be using this sensor and if he's having a positive response to it and is it helping to with control. Requesting a fax  Fax# [phone] Call back # [phone]

## 2024-04-07 ENCOUNTER — Other Ambulatory Visit: Payer: Self-pay

## 2024-04-08 ENCOUNTER — Other Ambulatory Visit: Payer: Self-pay

## 2024-04-08 ENCOUNTER — Ambulatory Visit: Admitting: Gastroenterology

## 2024-04-08 ENCOUNTER — Telehealth: Payer: Self-pay | Admitting: Family Medicine

## 2024-04-08 NOTE — Telephone Encounter (Signed)
 Copied from CRM [id number]. Topic: Clinical - Medication Prior Auth >> Apr 08, 2024  2:56 PM Santiya F wrote: Reason for CRM: Patient is calling in because his PA for his Freestyle Herlene was denied and patient wants to know why. Patient says he will reach out to insurance, but if there is something the provider can do on their end he would appreciate it.

## 2024-04-11 ENCOUNTER — Other Ambulatory Visit: Payer: Self-pay

## 2024-04-12 ENCOUNTER — Other Ambulatory Visit (HOSPITAL_COMMUNITY): Payer: Self-pay

## 2024-04-12 ENCOUNTER — Other Ambulatory Visit: Payer: Self-pay | Admitting: Family Medicine

## 2024-04-12 ENCOUNTER — Other Ambulatory Visit: Payer: Self-pay

## 2024-04-12 DIAGNOSIS — N528 Other male erectile dysfunction: Secondary | ICD-10-CM

## 2024-04-12 MED ORDER — SILDENAFIL CITRATE 100 MG PO TABS
100.0000 mg | ORAL_TABLET | Freq: Every day | ORAL | 0 refills | Status: DC | PRN
  Filled 2024-04-12: qty 10, 30d supply, fill #0

## 2024-04-13 ENCOUNTER — Other Ambulatory Visit: Payer: Self-pay

## 2024-04-13 ENCOUNTER — Other Ambulatory Visit: Payer: Self-pay | Admitting: Pharmacist

## 2024-04-13 ENCOUNTER — Other Ambulatory Visit: Payer: Self-pay | Admitting: Family Medicine

## 2024-04-13 MED ORDER — DEXCOM G7 RECEIVER DEVI
Status: DC
  Filled 2024-04-13: qty 1, fill #0

## 2024-04-13 MED ORDER — DEXCOM G7 SENSOR MISC
6 refills | Status: AC
  Filled 2024-04-13: qty 3, 30d supply, fill #0
  Filled 2024-05-12: qty 3, 30d supply, fill #1
  Filled 2024-06-06: qty 3, 30d supply, fill #2
  Filled 2024-06-17 – 2024-07-19 (×4): qty 3, 30d supply, fill #3
  Filled 2024-08-12: qty 3, 30d supply, fill #4
  Filled 2024-08-24 – 2024-09-03 (×3): qty 3, 30d supply, fill #5

## 2024-04-13 MED ORDER — DEXCOM G7 SENSOR MISC
Status: DC
  Filled 2024-04-13: qty 1, fill #0

## 2024-04-13 MED ORDER — DEXCOM G7 RECEIVER DEVI
0 refills | Status: DC
  Filled 2024-04-13: qty 1, 90d supply, fill #0

## 2024-04-13 NOTE — Telephone Encounter (Signed)
 Do I need to send over a script for dexcom.

## 2024-04-13 NOTE — Telephone Encounter (Signed)
 Have you seen anything regarding this PA

## 2024-04-13 NOTE — Telephone Encounter (Signed)
 Requested medication (s) are due for refill today: Yes  Requested medication (s) are on the active medication list: Yes  Last refill:  03/01/24   Future visit scheduled: Yes  Notes to clinic:  Unable to refill per protocol, cannot delegate. This was last prescribed by hospital provider.     Requested Prescriptions  Pending Prescriptions Disp Refills   oxyCODONE  (ROXICODONE ) 15 MG immediate release tablet 30 tablet 0    Sig: Take 1 tablet (15 mg total) by mouth every 4 (four) hours as needed.     Not Delegated - Analgesics:  Opioid Agonists Failed - 04/13/2024 11:03 AM      Failed - This refill cannot be delegated      Failed - Urine Drug Screen completed in last 360 days      Failed - Valid encounter within last 3 months    Recent Outpatient Visits           4 months ago Type 2 diabetes mellitus with hyperglycemia, unspecified whether long term insulin  use (HCC)   [facility] [place]. [facility], Jon M, [place]   9 months ago Acute cystitis with hematuria   [facility] [place]. [facility] Brien Belvie BRAVO, [place]   11 months ago Hypertension associated with diabetes [facility])   [facility] [place]. [facility] Del[place] Clam, [place]   11 months ago Type 2 diabetes mellitus with diabetic polyneuropathy, with long-term current use of insulin  [facility])   [facility] [place]. [facility] Clam, [place]   1 year ago Type 2 diabetes mellitus with diabetic polyneuropathy, with long-term current use of insulin  [facility])   [facility] [place]. [facility] Delbert Clam, [place]       Future Appointments             Tomorrow Dea Shiner, MD [facility] - A Dept Of [place]. [facility], [place]

## 2024-04-13 NOTE — Telephone Encounter (Unsigned)
 Copied from CRM #[id number]. Topic: Clinical - Medication Question >> Apr 13, 2024 10:08 AM Precious C wrote: Reason for CRM: Patient called requesting a medication refill for oxycodone  15 mg immediate release tablets. He stated the medication was originally prescribed by another provider, but when he reached out to that office for a refill, he was advised to contact his primary care provider. Patient also reported that his pharmacist informed him they would be sending an order for the refill as well. Patient is requesting this medication be refilled and would like a follow-up via MyChart or by telephone. If there is no answer, a voicemail can be left. Patient's callback number is [phone].

## 2024-04-13 NOTE — Telephone Encounter (Signed)
 Script has been sent.

## 2024-04-14 ENCOUNTER — Ambulatory Visit (INDEPENDENT_AMBULATORY_CARE_PROVIDER_SITE_OTHER): Admitting: Infectious Diseases

## 2024-04-14 ENCOUNTER — Other Ambulatory Visit: Payer: Self-pay

## 2024-04-14 ENCOUNTER — Encounter: Payer: Self-pay | Admitting: Infectious Diseases

## 2024-04-14 VITALS — BP 151/90 | HR 85 | Temp 98.3°F

## 2024-04-14 DIAGNOSIS — E1169 Type 2 diabetes mellitus with other specified complication: Secondary | ICD-10-CM | POA: Diagnosis not present

## 2024-04-14 DIAGNOSIS — E11628 Type 2 diabetes mellitus with other skin complications: Secondary | ICD-10-CM

## 2024-04-14 DIAGNOSIS — Z79899 Other long term (current) drug therapy: Secondary | ICD-10-CM | POA: Diagnosis not present

## 2024-04-14 DIAGNOSIS — L089 Local infection of the skin and subcutaneous tissue, unspecified: Secondary | ICD-10-CM

## 2024-04-14 DIAGNOSIS — R7881 Bacteremia: Secondary | ICD-10-CM

## 2024-04-14 DIAGNOSIS — A491 Streptococcal infection, unspecified site: Secondary | ICD-10-CM

## 2024-04-14 NOTE — Progress Notes (Signed)
 Patient Active Problem List   Diagnosis Date Noted   Cutaneous abscess of left foot 02/25/2024   Erosive esophagitis 02/25/2024   Duodenitis 02/25/2024   Iron deficiency anemia 02/24/2024   Coffee ground emesis 02/23/2024   Encephalopathy acute 02/22/2024   Non-pressure chronic ulcer of other part of left foot limited to breakdown of skin (HCC) 11/13/2023   Charcot joint of left foot 11/13/2023   Acute pyelonephritis 11/11/2023   DKA, type 2 (HCC) 11/10/2023   Flank pain 11/10/2023   Left thigh pain 11/10/2023   Neck pain 11/10/2023   Elevated troponin 11/10/2023   Lactic acidosis 11/10/2023   Acute cystitis with hematuria 06/25/2023   History of transmetatarsal amputation of right foot (HCC) 04/27/2023   Lymphedema 03/24/2023   Right ventricular enlargement 03/24/2023   Diabetic foot infection (HCC) 03/23/2023   Subacute osteomyelitis, left ankle and foot (HCC) 03/22/2023   Atrial fibrillation, chronic (HCC) 03/22/2023   HLD (hyperlipidemia) 03/22/2023   Gastroparesis 03/22/2023   Depression 09/08/2022   GERD (gastroesophageal reflux disease) 03/03/2022   Diabetes mellitus (HCC) 09/19/2021   PAF (paroxysmal atrial fibrillation) (HCC)    Acute on chronic anemia    Venous insufficiency (chronic) (peripheral) 03/15/2019   Edema of both lower extremities due to peripheral venous insufficiency 03/15/2019   Essential hypertension 11/23/2018   AKI (acute kidney injury) (HCC) 11/23/2018   Morbid obesity with BMI of 45.0-49.9, adult (HCC) 11/23/2018   Type 2 diabetes mellitus with other specified complication (HCC) 11/23/2018   Diabetic infection of left foot (HCC)    DKA (diabetic ketoacidosis) (HCC) 07/30/2018   DMII (diabetes mellitus, type 2) (HCC) 07/28/2018    Patient's Medications  New Prescriptions   No medications on file  Previous Medications   ACCU-CHEK SOFTCLIX LANCETS LANCETS    Use to check blood sugar 3 times daily.   ACETAMINOPHEN  (TYLENOL ) 500 MG TABLET     Take 2 tablets (1,000 mg total) by mouth every 8 (eight) hours.   ACIDOPHILUS (RISAQUAD) CAPS CAPSULE    Take 2 capsules by mouth 3 (three) times daily.   ATORVASTATIN  (LIPITOR) 20 MG TABLET    Take 1 tablet (20 mg total) by mouth at bedtime.   CONTINUOUS GLUCOSE RECEIVER (DEXCOM G7 RECEIVER) DEVI    Use to test blood sugar   CONTINUOUS GLUCOSE SENSOR (DEXCOM G7 SENSOR) MISC    Use to test blood sugar   FERROUS SULFATE  325 (65 FE) MG TABLET    Take 1 tablet (325 mg total) by mouth daily with breakfast.   GABAPENTIN  (NEURONTIN ) 300 MG CAPSULE    Take 1 capsule (300 mg total) by mouth 3 (three) times daily.   GLUCOSE BLOOD (ACCU-CHEK GUIDE TEST) TEST STRIP    use as directed 3 (three) times daily.   INSULIN  GLARGINE (LANTUS  SOLOSTAR) 100 UNIT/ML SOLOSTAR PEN    Inject 27 Units into the skin daily.   INSULIN  LISPRO (HUMALOG  KWIKPEN) 100 UNIT/ML KWIKPEN    Inject 0-12 units into the skin three times daily with meals. Per sliding scale.   METOPROLOL  TARTRATE (LOPRESSOR ) 50 MG TABLET    Take 1 tablet (50 mg total) by mouth 2 (two) times daily.   MULTIPLE VITAMIN (MULTIVITAMIN WITH MINERALS) TABS TABLET    Take 1 tablet by mouth daily.   OXYCODONE  (ROXICODONE ) 15 MG IMMEDIATE RELEASE TABLET    Take 1 tablet (15 mg total) by mouth every 4 (four) hours as needed for severe pain (pain score 7-10).   PANTOPRAZOLE  (PROTONIX )  40 MG TABLET    Take 1 tablet (40 mg total) by mouth daily.   SILDENAFIL  (VIAGRA ) 100 MG TABLET    Take 1 tablet (100 mg total) by mouth daily as needed for erectile dysfunction. At least 24 hours between doses   SIMETHICONE  (MYLICON) 80 MG CHEWABLE TABLET    Chew 1 tablet (80 mg total) by mouth 4 (four) times daily as needed for flatulence.   VALSARTAN  (DIOVAN ) 160 MG TABLET    Take 1 tablet (160 mg total) by mouth daily.  Modified Medications   No medications on file  Discontinued Medications   No medications on file    Subjective: Discussed the use of AI scribe software for  clinical note transcription with the patient, who gave verbal consent to proceed.  53 Y O male with prior h/o chest wall abscess and [place] joint infection with MSSA and Group A strep treated in 2020, A fib, GERD, HLD, HTN, DM complicated with b/l feet osteomyelitis s/p RT TMA with polymicrobial cultures treated with 6 weeks augmentin  who is here for HFU after recent admission 7/13-7/25 for DKA, Group B strep bacteremia 2/2 left foot infection.  Hospital course complicated requiring Left BKA 7/18. TTE 7/15 with no vegetations. TEE deferred. EGD 7/17 with findings as below for coffee ground emesis. Seen by ID and was discharged to complete 6 weeks course with augmentin  through 8/24  9/4 Completed po augmentin  without missed doses or concerns. Last seen by Ortho on 8/22 , Left AKA wound well healed except medial popliteal fossa have a dime size pressure sore filled with full fibrinous exudative tissue.   Doing well with no concerns today.   Review of Systems: all systems reviewed with pertinent positives and negatives as listed above   Past Medical History:  Diagnosis Date   Atrial fibrillation (HCC)    Diabetes mellitus without complication (HCC)    Type 2   GERD (gastroesophageal reflux disease)    Hyperlipidemia    Hypertension    Morbid (severe) obesity due to excess calories (HCC) 08/07/2023   bmi 44.63   Wears glasses    Wound, open, foot    left diabetic    Past Surgical History:  Procedure Laterality Date   AMPUTATION Left 08/20/2018   Procedure: LEFT FOOT IRRIGATON AND DEBRIDEMENT, 5TH RAY AMPUTATION;  Surgeon: Harden Jerona GAILS, MD;  Location: [facility] OR;  Service: Orthopedics;  Laterality: Left;   AMPUTATION Right 03/25/2023   Procedure: RIGHT TRANSMETATARSAL AMPUTATION;  Surgeon: Harden Jerona GAILS, MD;  Location: [facility] OR;  Service: Orthopedics;  Laterality: Right;   AMPUTATION Left 02/26/2024   Procedure: AMPUTATION BELOW KNEE, LEFT;  Surgeon: Harden Jerona GAILS, MD;  Location: [facility] OR;  Service:  Orthopedics;  Laterality: Left;  WOUND VAC APPLIED   APPLICATION OF A-CELL OF CHEST/ABDOMEN N/A 05/30/2019   Procedure: Application Of A-Cell Of Chest;  Surgeon: Shyrl Linnie KIDD, MD;  Location: [facility] OR;  Service: Cardiothoracic;  Laterality: N/A;   APPLICATION OF WOUND VAC N/A 05/30/2019   Procedure: Application Of Wound Vac;  Surgeon: Shyrl Linnie KIDD, MD;  Location: [facility] OR;  Service: Cardiothoracic;  Laterality: N/A;   APPLICATION OF WOUND VAC Left 02/26/2024   Procedure: APPLICATION, WOUND VAC;  Surgeon: Harden Jerona GAILS, MD;  Location: [facility] OR;  Service: Orthopedics;  Laterality: Left;   ESOPHAGOGASTRODUODENOSCOPY N/A 02/25/2024   Procedure: EGD (ESOPHAGOGASTRODUODENOSCOPY);  Surgeon: Suzann Inocente HERO, MD;  Location: [facility] ENDOSCOPY;  Service: Gastroenterology;  Laterality: N/A;   FINGER SURGERY Right 2016  I&D  small finger   I & D EXTREMITY Left 07/30/2018   Procedure: IRRIGATION AND DEBRIDEMENT LEFT FOOT WITH POSSIBLE AMPUTATION OF FIFTH TOE;  Surgeon: Vernetta Lonni GRADE, MD;  Location: [facility] ORS;  Service: Orthopedics;  Laterality: Left;   INCISION AND DRAINAGE ABSCESS Left 01/19/2019   Procedure: INCISION AND DRAINAGE ABSCESS upper chest;  Surgeon: Carlie Clark, MD;  Location: [facility] OR;  Service: ENT;  Laterality: Left;   IRRIGATION AND DEBRIDEMENT STERNOCLAVICULAR JOINT-STERNUM AND RIBS N/A 05/30/2019   Procedure: IRRIGATION AND DEBRIDEMENT OF STERNOCLAVICULAR JOINT-STERNUM AND RIBS ;  Surgeon: Shyrl Linnie KIDD, MD;  Location: [facility] OR;  Service: Cardiothoracic;  Laterality: N/A;   MINOR IRRIGATION AND DEBRIDEMENT OF WOUND N/A 11/22/2018   Procedure: INCISION AND DRAINAGE OF NECK ABSCESS;  Surgeon: Carlie Clark, MD;  Location: [facility] ORS;  Service: ENT;  Laterality: N/A;     Social History   Tobacco Use   Smoking status: Never   Smokeless tobacco: Never  Vaping Use   Vaping status: Never Used  Substance Use Topics   Alcohol use: No    Alcohol/week: 0.0 standard drinks of alcohol    Drug use: No    Family History  Problem Relation Age of Onset   Diabetes Mother    Colon cancer Brother    Esophageal cancer Neg Hx    Rectal cancer Neg Hx    Stomach cancer Neg Hx     No Known Allergies  Health Maintenance  Topic Date Due   COVID-19 Vaccine (1) Never done   FOOT EXAM  Never done   OPHTHALMOLOGY EXAM  Never done   Pneumococcal Vaccine: 50+ Years (1 of 2 - PCV) Never done   Hepatitis B Vaccines 19-59 Average Risk (1 of 3 - 19+ 3-dose series) Never done   Zoster Vaccines- Shingrix (1 of 2) Never done   Diabetic kidney evaluation - Urine ACR  05/10/2023   INFLUENZA VACCINE  Never done   COLON CANCER SCREENING ANNUAL FOBT  08/06/2024   HEMOGLOBIN A1C  08/24/2024   Diabetic kidney evaluation - eGFR measurement  03/03/2025   DTaP/Tdap/Td (2 - Td or Tdap) 08/02/2025   Hepatitis C Screening  Completed   HIV Screening  Completed   HPV VACCINES  Aged Out   Meningococcal B Vaccine  Aged Out   Colonoscopy  Discontinued    Objective: BP (!) 151/90   Pulse 85   Temp 98.3 F (36.8 C) (Temporal)   SpO2 98%    Physical Exam Constitutional:      Appearance: Normal appearance.  HENT:     Head: Normocephalic and atraumatic.      Mouth: Mucous membranes are moist.  Eyes:    Conjunctiva/sclera: Conjunctivae normal.     Pupils: Pupils are equal, round, and b/l symmetrical    Cardiovascular:     Rate and Rhythm: Normal rate and Irregular rhythm.     Heart sounds: S1S2  Pulmonary:     Effort: Pulmonary effort is normal.     Breath sounds: Normal breath sounds.   Abdominal:     General: Non distended     Palpations: soft, non tender   Musculoskeletal:        General: sitting in the wheel chair, Left BKA has healed      Skin:    General: Skin is warm and dry.     Comments:  Neurological:     General: grossly non focal     Mental Status: awake, alert and oriented to  person, place, and time.   Psychiatric:        Mood and Affect: Mood normal.    Lab Results Lab Results  Component Value Date   WBC 8.2 03/02/2024   HGB 9.3 (L) 03/02/2024   HCT 30.1 (L) 03/02/2024   MCV 87.5 03/02/2024   PLT 347 03/02/2024    Lab Results  Component Value Date   CREATININE 0.91 03/03/2024   BUN 28 (H) 03/03/2024   NA 135 03/03/2024   K 4.4 03/03/2024   CL 106 03/03/2024   CO2 20 (L) 03/03/2024    Lab Results  Component Value Date   ALT 10 02/22/2024   AST 11 (L) 02/22/2024   ALKPHOS 85 02/22/2024   BILITOT 1.7 (H) 02/22/2024    Lab Results  Component Value Date   CHOL 153 11/11/2023   HDL 60 11/11/2023   LDLCALC 81 11/11/2023   TRIG 61 11/11/2023   CHOLHDL 2.6 11/11/2023   No results found for: LABRPR, RPRTITER No results found for: HIV1RNAQUANT, HIV1RNAVL, CD4TABS   Microbiology Results for orders placed or performed during the hospital encounter of 02/21/24  Resp panel by RT-PCR (RSV, Flu A&B, Covid) Anterior Nasal Swab     Status: None   Collection Time: 02/21/24  9:03 PM   Specimen: Anterior Nasal Swab  Result Value Ref Range Status   SARS Coronavirus 2 by RT PCR NEGATIVE NEGATIVE Final   Influenza A by PCR NEGATIVE NEGATIVE Final   Influenza B by PCR NEGATIVE NEGATIVE Final    Comment: (NOTE) The Xpert Xpress SARS-CoV-2/FLU/RSV plus assay is intended as an aid in the diagnosis of influenza from Nasopharyngeal swab specimens and should not be used as a sole basis for treatment. Nasal washings and aspirates are unacceptable for Xpert Xpress SARS-CoV-2/FLU/RSV testing.  Fact Sheet for Patients: BloggerCourse.com  Fact Sheet for Healthcare Providers: SeriousBroker.it  This test is not yet approved or cleared by the [place]  FDA and has been authorized for detection and/or diagnosis of SARS-CoV-2 by FDA under an Emergency Use Authorization (EUA). This EUA will remain in effect (meaning this test can be used) for the duration of the COVID-19  declaration under Section 564(b)(1) of the Act, 21 U.S.C. section 360bbb-3(b)(1), unless the authorization is terminated or revoked.     Resp Syncytial Virus by PCR NEGATIVE NEGATIVE Final    Comment: (NOTE) Fact Sheet for Patients: BloggerCourse.com  Fact Sheet for Healthcare Providers: SeriousBroker.it  This test is not yet approved or cleared by the [place]  FDA and has been authorized for detection and/or diagnosis of SARS-CoV-2 by FDA under an Emergency Use Authorization (EUA). This EUA will remain in effect (meaning this test can be used) for the duration of the COVID-19 declaration under Section 564(b)(1) of the Act, 21 U.S.C. section 360bbb-3(b)(1), unless the authorization is terminated or revoked.  Performed at [facility], [address] [place], [place] [address]   Blood Culture (routine x 2)     Status: Abnormal   Collection Time: 02/21/24  9:03 PM   Specimen: BLOOD  Result Value Ref Range Status   Specimen Description BLOOD BLOOD RIGHT HAND  Final   Special Requests   Final    BOTTLES DRAWN AEROBIC AND ANAEROBIC Blood Culture results may not be optimal due to an inadequate volume of blood received in culture bottles   Culture  Setup Time   Final    GRAM POSITIVE COCCI IN CHAINS IN BOTH AEROBIC AND ANAEROBIC BOTTLES CRITICAL RESULT CALLED  TO, READ BACK BY AND VERIFIED WITH: PHARMD EMILY S ON [id number] @1056  BY SM Performed at [facility], [address], [place], [place] [address]    Culture GROUP B STREP(S.AGALACTIAE)ISOLATED (A)  Final   Report Status 02/24/2024 FINAL  Final   Organism ID, Bacteria GROUP B STREP(S.AGALACTIAE)ISOLATED  Final      Susceptibility   Group b strep(s.agalactiae)isolated - MIC*    CLINDAMYCIN  >=1 RESISTANT Resistant     AMPICILLIN  <=0.25 SENSITIVE Sensitive     ERYTHROMYCIN >=8 RESISTANT Resistant     VANCOMYCIN  1 SENSITIVE Sensitive     CEFTRIAXONE  <=0.12  SENSITIVE Sensitive     LEVOFLOXACIN 0.5 SENSITIVE Sensitive     PENICILLIN  <=0.06 SENSITIVE Sensitive     * GROUP B STREP(S.AGALACTIAE)ISOLATED  Blood Culture ID Panel (Reflexed)     Status: Abnormal   Collection Time: 02/21/24  9:03 PM  Result Value Ref Range Status   Enterococcus faecalis NOT DETECTED NOT DETECTED Final   Enterococcus Faecium NOT DETECTED NOT DETECTED Final   Listeria monocytogenes NOT DETECTED NOT DETECTED Final   Staphylococcus species NOT DETECTED NOT DETECTED Final   Staphylococcus aureus (BCID) NOT DETECTED NOT DETECTED Final   Staphylococcus epidermidis NOT DETECTED NOT DETECTED Final   Staphylococcus lugdunensis NOT DETECTED NOT DETECTED Final   Streptococcus species DETECTED (A) NOT DETECTED Final    Comment: CRITICAL RESULT CALLED TO, READ BACK BY AND VERIFIED WITH: PHARMD EMILY S ON [id number] @1056  BY SM    Streptococcus agalactiae DETECTED (A) NOT DETECTED Final    Comment: CRITICAL RESULT CALLED TO, READ BACK BY AND VERIFIED WITH: PHARMD EMILY S ON [id number] @1056  BY SM    Streptococcus pneumoniae NOT DETECTED NOT DETECTED Final   Streptococcus pyogenes NOT DETECTED NOT DETECTED Final   A.calcoaceticus-baumannii NOT DETECTED NOT DETECTED Final   Bacteroides fragilis NOT DETECTED NOT DETECTED Final   Enterobacterales NOT DETECTED NOT DETECTED Final   Enterobacter cloacae complex NOT DETECTED NOT DETECTED Final   Escherichia coli NOT DETECTED NOT DETECTED Final   Klebsiella aerogenes NOT DETECTED NOT DETECTED Final   Klebsiella oxytoca NOT DETECTED NOT DETECTED Final   Klebsiella pneumoniae NOT DETECTED NOT DETECTED Final   Proteus species NOT DETECTED NOT DETECTED Final   Salmonella species NOT DETECTED NOT DETECTED Final   Serratia marcescens NOT DETECTED NOT DETECTED Final   Haemophilus influenzae NOT DETECTED NOT DETECTED Final   Neisseria meningitidis NOT DETECTED NOT DETECTED Final   Pseudomonas aeruginosa NOT DETECTED NOT DETECTED Final    Stenotrophomonas maltophilia NOT DETECTED NOT DETECTED Final   Candida albicans NOT DETECTED NOT DETECTED Final   Candida auris NOT DETECTED NOT DETECTED Final   Candida glabrata NOT DETECTED NOT DETECTED Final   Candida krusei NOT DETECTED NOT DETECTED Final   Candida parapsilosis NOT DETECTED NOT DETECTED Final   Candida tropicalis NOT DETECTED NOT DETECTED Final   Cryptococcus neoformans/gattii NOT DETECTED NOT DETECTED Final    Comment: Performed at [facility], [address] [place], [place] [address]  MRSA Next Gen by PCR, Nasal     Status: Abnormal   Collection Time: 02/21/24 11:25 PM   Specimen: Nasal Mucosa; Nasal Swab  Result Value Ref Range Status   MRSA by PCR Next Gen DETECTED (A) NOT DETECTED Final    Comment: RESULT CALLED TO, READ BACK BY AND VERIFIED WITH: WILLIAMSON RN 02/22/2024 @ 0205 BY AB (NOTE) The GeneXpert MRSA Assay (FDA approved for NASAL specimens only), is one component of  a comprehensive MRSA colonization surveillance program. It is not intended to diagnose MRSA infection nor to guide or monitor treatment for MRSA infections. Test performance is not FDA approved in patients less than 10 years old. Performed at [facility], [address], [place], [place] [address]   Culture, blood (Routine X 2) w Reflex to ID Panel     Status: None   Collection Time: 02/22/24 10:30 AM   Specimen: BLOOD RIGHT HAND  Result Value Ref Range Status   Specimen Description BLOOD RIGHT HAND  Final   Special Requests   Final    BOTTLES DRAWN AEROBIC AND ANAEROBIC Blood Culture adequate volume   Culture   Final    NO GROWTH 5 DAYS Performed at [facility], [address] [place], [place] [address]    Report Status 02/27/2024 FINAL  Final  Culture, blood (Routine X 2) w Reflex to ID Panel     Status: None   Collection Time: 02/22/24 10:30 AM   Specimen: BLOOD RIGHT HAND  Result Value Ref Range Status   Specimen Description BLOOD RIGHT HAND  Final    Special Requests   Final    BOTTLES DRAWN AEROBIC ONLY Blood Culture adequate volume   Culture   Final    NO GROWTH 5 DAYS Performed at [facility], [address], [place], [place] [address]    Report Status 02/27/2024 FINAL  Final   7/15 TTE IMPRESSIONS     1. Left ventricular ejection fraction, by estimation, is 55 to 60%. The  left ventricle has normal function. The left ventricle has no regional  wall motion abnormalities. Left ventricular diastolic parameters were  normal.   2. Right ventricular systolic function is normal. The right ventricular  size is normal. There is mildly elevated pulmonary artery systolic  pressure. The estimated right ventricular systolic pressure is 39.8 mmHg.   3. The mitral valve is normal in structure. No evidence of mitral valve  regurgitation. No evidence of mitral stenosis.   4. The aortic valve is tricuspid. Aortic valve regurgitation is not  visualized. No aortic stenosis is present.   5. The inferior vena cava is dilated in size with <50% respiratory  variability, suggesting right atrial pressure of 15 mmHg.   Conclusion(s)/Recommendation(s): No evidence of valvular vegetations on  this transthoracic echocardiogram. Consider a transesophageal  echocardiogram to exclude infective endocarditis if clinically indicated.   Pathology  02/25/24 Path  FINAL MICROSCOPIC DIAGNOSIS:  A. DUODENUM, BIOPSY:  Reactive duodenal mucosa showing focal gastric metaplasia compatible  with peptic duodenitis   B. STOMACH, BIOPSY:  Reactive gastropathy showing focal activity  Negative for H. pylori, intestinal metaplasia, dysplasia and carcinoma  02/26/24 Path  FINAL MICROSCOPIC DIAGNOSIS: A. LEFT LEG, BELOW KNEE AMPUTATION: Gangrenous ulcer with coagulative necrosis and marked acute gangrenous cellulitis and underlying acute osteomyelitis Proximal skin and soft tissue margin grossly viable Large vessels widely patent Arteriolar sclerosis and  arteriolosclerosis   Imaging No results found.  Assessment/Plan # Group B strep bacteremia 2/2 left diabetic foot infection - 7/18 s/p elft BKA - s/p completion of prolonged course of PO amoxicillin  through 8/24 - Left BKA healed  - Discussed signs and symptoms of recurrence of infection to seek attention - fu as needed/new concerns   # DM - A1c 10.3 - fu with PCP for BG control   I spent 33 minutes involved in face-to-face and non-face-to-face activities for this patient on the day of the visit. Professional  time spent includes the following activities: Preparing to see the patient (review of tests), Obtaining and reviewing separately obtained history (discharge record 7/25, Notes from Dr Dennise), Performing a medically appropriate examination and evaluation, Documenting clinical information in the EMR, Independently interpreting results (not separately reported), Communicating results to the patient, Counseling and educating the patient and Care coordination (not separately reported).   Of note, portions of this note may have been created with voice recognition software. While this note has been edited for accuracy, occasional wrong-word or 'sound-a-like' substitutions may have occurred due to the inherent limitations of voice recognition software.   Annalee Joseph, MD [facility] 04/14/2024, 10:25 AM

## 2024-04-15 ENCOUNTER — Other Ambulatory Visit: Payer: Self-pay

## 2024-04-16 DIAGNOSIS — R69 Illness, unspecified: Secondary | ICD-10-CM | POA: Diagnosis not present

## 2024-04-18 ENCOUNTER — Telehealth: Payer: Self-pay | Admitting: Family Medicine

## 2024-04-18 ENCOUNTER — Telehealth: Payer: Self-pay | Admitting: Orthopedic Surgery

## 2024-04-18 ENCOUNTER — Ambulatory Visit: Payer: Self-pay

## 2024-04-18 ENCOUNTER — Other Ambulatory Visit (HOSPITAL_COMMUNITY): Payer: Self-pay

## 2024-04-18 DIAGNOSIS — Z89512 Acquired absence of left leg below knee: Secondary | ICD-10-CM | POA: Diagnosis not present

## 2024-04-18 NOTE — Telephone Encounter (Signed)
 Patient called and needs a refill on the Oxycodone  15mg , CB#[phone] If you could send it to the [facility] and wellness on [address].

## 2024-04-18 NOTE — Telephone Encounter (Unsigned)
 Copied from CRM [id number]. Topic: Clinical - Medication Refill >> Apr 18, 2024 11:55 AM Berwyn MATSU wrote: Medication:oxyCODONE  (ROXICODONE ) 15 MG immediate release tablet; patient is requesting medication as he just had AMPUTATION BELOW KNEE, LEFT and stated that hospital advised that he would need to contact PCP for refill.  Per patient he needs this medication and who does he need to speak to as he requested this medication and no one has reached out.   Has the patient contacted their pharmacy? Yes (Agent: If no, request that the patient contact the pharmacy for the refill. If patient does not wish to contact the pharmacy document the reason why and proceed with request.) (Agent: If yes, when and what did the pharmacy advise?)  This is the patient's preferred pharmacy:  [facility] [address] [place] [address] Phone: [phone] Fax: [phone]  Is this the correct pharmacy for this prescription? Yes If no, delete pharmacy and type the correct one.   Has the prescription been filled recently? Yes  Is the patient out of the medication? Yes  Has the patient been seen for an appointment in the last year OR does the patient have an upcoming appointment? Yes  Can we respond through MyChart? Yes  Agent: Please be advised that Rx refills may take up to 3 business days. We ask that you follow-up with your pharmacy.  May you please follow up with patient.   Thank you,

## 2024-04-18 NOTE — Telephone Encounter (Signed)
 Call placed to patient and informed patient that medication should come from his ortho doctor.

## 2024-04-18 NOTE — Telephone Encounter (Signed)
    FYI Only or Action Required?: Action required by provider: states phantom pain really bad and wants rx to get pain medication.  Patient was last seen in primary care on 12/03/2023 by Danton Jon HERO, PA-C.  Called Nurse Triage reporting Advice Only.  Symptoms began today.  Interventions attempted: Nothing.  Symptoms are: stable.  Triage Disposition: No disposition on file.  Patient/caregiver understands and will follow disposition?:

## 2024-04-19 ENCOUNTER — Other Ambulatory Visit: Payer: Self-pay | Admitting: Physician Assistant

## 2024-04-19 ENCOUNTER — Telehealth: Payer: Self-pay

## 2024-04-19 ENCOUNTER — Other Ambulatory Visit (HOSPITAL_COMMUNITY): Payer: Self-pay

## 2024-04-19 ENCOUNTER — Other Ambulatory Visit: Payer: Self-pay

## 2024-04-19 ENCOUNTER — Telehealth: Payer: Self-pay | Admitting: Orthopedic Surgery

## 2024-04-19 ENCOUNTER — Telehealth: Payer: Self-pay | Admitting: Physician Assistant

## 2024-04-19 DIAGNOSIS — S88112A Complete traumatic amputation at level between knee and ankle, left lower leg, initial encounter: Secondary | ICD-10-CM

## 2024-04-19 DIAGNOSIS — Z89512 Acquired absence of left leg below knee: Secondary | ICD-10-CM | POA: Diagnosis not present

## 2024-04-19 MED ORDER — OXYCODONE-ACETAMINOPHEN 5-325 MG PO TABS: 1.0000 | ORAL_TABLET | Freq: Four times a day (QID) | ORAL | 0 refills | Status: DC | PRN

## 2024-04-19 MED ORDER — OXYCODONE-ACETAMINOPHEN 5-325 MG PO TABS
1.0000 | ORAL_TABLET | Freq: Four times a day (QID) | ORAL | 0 refills | Status: DC | PRN
  Filled 2024-04-19: qty 19, 5d supply, fill #0

## 2024-04-19 NOTE — Telephone Encounter (Signed)
 This was sent yesterday, will send again. Deland, can you send in refill of pain medication, he has called 3 times.

## 2024-04-19 NOTE — Telephone Encounter (Signed)
 Pt called this morning asking for his oxycodone  please. Pt states he is in pain and need them ASAP please. Explained to pt Harden is not her but I will send and ask Deland to send in oxycodone . Please send to [facility] and call pt when sent in. Pt phone number is [phone].

## 2024-04-19 NOTE — Telephone Encounter (Signed)
 The previous message from yesterday has been sent to provider for approval of refill. I am closing this out since this is a duplicate.

## 2024-04-19 NOTE — Telephone Encounter (Signed)
 This pt is s/p a left BKA 02/26/2024 and is requesting a refill on Oxycodone  last refill was  03/01/2024 #30 oxy 15 mg please advise.

## 2024-04-19 NOTE — Telephone Encounter (Signed)
 Copied from CRM [id number]. Topic: General - Other >> Apr 19, 2024  2:25 PM Rosaria BRAVO wrote: Reason for CRM: Rosaline from [facility], status of paperwork. 2 orders, a wound order and an incontinence supply  order. faxed over 9/5  Best contact: [phone] ext. [phone]  She refaxed the orders. Please advise when faxes are received.

## 2024-04-19 NOTE — Telephone Encounter (Signed)
 Pt called requesting oxycodone . Please send to [facility]. Pt called yesterday. Pease call pt when sent to pharmacy. Pt number is [phone].

## 2024-04-20 ENCOUNTER — Other Ambulatory Visit: Payer: Self-pay

## 2024-04-20 NOTE — Telephone Encounter (Signed)
 Patient has been scheduled an appointment to discuss.

## 2024-04-20 NOTE — Telephone Encounter (Unsigned)
 Copied from CRM [id number]. Topic: Clinical - Order For Equipment >> Apr 20, 2024 10:30 AM Gustabo D wrote: Wound supply order and incontinence  Home care delivered [facility] call back [phone] Please be on the look out for the fax

## 2024-04-21 ENCOUNTER — Other Ambulatory Visit: Payer: Self-pay

## 2024-04-22 DIAGNOSIS — Z419 Encounter for procedure for purposes other than remedying health state, unspecified: Secondary | ICD-10-CM | POA: Diagnosis not present

## 2024-04-25 DIAGNOSIS — Z89512 Acquired absence of left leg below knee: Secondary | ICD-10-CM | POA: Diagnosis not present

## 2024-04-26 ENCOUNTER — Ambulatory Visit: Admitting: Nurse Practitioner

## 2024-04-26 DIAGNOSIS — Z89512 Acquired absence of left leg below knee: Secondary | ICD-10-CM | POA: Diagnosis not present

## 2024-04-26 NOTE — Telephone Encounter (Unsigned)
 Copied from CRM [id number]. Topic: Clinical - Medical Advice >> Apr 25, 2024  4:50 PM Viola FALCON wrote: Patient says a Home Health agency keeps calling him because they have supplies for him but they cannot get in touch with the office. He doesn't know the name of the agency or call back number. >> Apr 26, 2024  9:47 AM Emylou G wrote: Tinnie w/[facility] said to call her back if need anything more on the forms rcvd, patient has an appt in regards.. their number [phone].[name]

## 2024-04-27 ENCOUNTER — Other Ambulatory Visit (HOSPITAL_COMMUNITY): Payer: Self-pay

## 2024-04-27 ENCOUNTER — Other Ambulatory Visit: Payer: Self-pay

## 2024-04-27 ENCOUNTER — Ambulatory Visit: Attending: Family Medicine | Admitting: Family Medicine

## 2024-04-27 ENCOUNTER — Encounter: Payer: Self-pay | Admitting: Family Medicine

## 2024-04-27 VITALS — BP 162/97 | HR 86 | Temp 97.7°F

## 2024-04-27 DIAGNOSIS — E1159 Type 2 diabetes mellitus with other circulatory complications: Secondary | ICD-10-CM

## 2024-04-27 DIAGNOSIS — N528 Other male erectile dysfunction: Secondary | ICD-10-CM | POA: Diagnosis not present

## 2024-04-27 DIAGNOSIS — I890 Lymphedema, not elsewhere classified: Secondary | ICD-10-CM | POA: Diagnosis not present

## 2024-04-27 DIAGNOSIS — E1142 Type 2 diabetes mellitus with diabetic polyneuropathy: Secondary | ICD-10-CM | POA: Diagnosis not present

## 2024-04-27 DIAGNOSIS — Z89512 Acquired absence of left leg below knee: Secondary | ICD-10-CM

## 2024-04-27 DIAGNOSIS — I152 Hypertension secondary to endocrine disorders: Secondary | ICD-10-CM | POA: Diagnosis not present

## 2024-04-27 DIAGNOSIS — Z79899 Other long term (current) drug therapy: Secondary | ICD-10-CM | POA: Diagnosis not present

## 2024-04-27 DIAGNOSIS — Z794 Long term (current) use of insulin: Secondary | ICD-10-CM | POA: Diagnosis not present

## 2024-04-27 MED ORDER — OZEMPIC (0.25 OR 0.5 MG/DOSE) 2 MG/1.5ML ~~LOC~~ SOPN
0.2500 mg | PEN_INJECTOR | SUBCUTANEOUS | 1 refills | Status: DC
  Filled 2024-04-27: qty 2, 70d supply, fill #0

## 2024-04-27 MED ORDER — ATORVASTATIN CALCIUM 20 MG PO TABS
20.0000 mg | ORAL_TABLET | Freq: Every day | ORAL | 1 refills | Status: AC
  Filled 2024-04-27 – 2024-05-12 (×2): qty 90, 90d supply, fill #0
  Filled 2024-08-12: qty 90, 90d supply, fill #1

## 2024-04-27 MED ORDER — GABAPENTIN 300 MG PO CAPS
300.0000 mg | ORAL_CAPSULE | Freq: Three times a day (TID) | ORAL | 1 refills | Status: DC
  Filled 2024-04-27 – 2024-05-12 (×2): qty 270, 90d supply, fill #0

## 2024-04-27 MED ORDER — FERROUS SULFATE 325 (65 FE) MG PO TABS
325.0000 mg | ORAL_TABLET | Freq: Every day | ORAL | 0 refills | Status: DC
  Filled 2024-04-27 – 2024-05-12 (×2): qty 30, 30d supply, fill #0

## 2024-04-27 MED ORDER — VALSARTAN 160 MG PO TABS
160.0000 mg | ORAL_TABLET | Freq: Every day | ORAL | 1 refills | Status: AC
  Filled 2024-04-27: qty 90, 90d supply, fill #0
  Filled 2024-07-19: qty 90, 90d supply, fill #1

## 2024-04-27 MED ORDER — CARVEDILOL 3.125 MG PO TABS
3.1250 mg | ORAL_TABLET | Freq: Two times a day (BID) | ORAL | 1 refills | Status: DC
  Filled 2024-04-27: qty 180, 90d supply, fill #0

## 2024-04-27 MED ORDER — SILDENAFIL CITRATE 100 MG PO TABS
100.0000 mg | ORAL_TABLET | Freq: Every day | ORAL | 0 refills | Status: DC | PRN
  Filled 2024-04-27 – 2024-04-28 (×2): qty 10, 10d supply, fill #0

## 2024-04-27 NOTE — Patient Instructions (Signed)
 VISIT SUMMARY:  Today, we discussed your recent foot amputation, diabetes management, and elevated blood pressure. We reviewed your current medications and made some adjustments to better control your blood sugar and blood pressure.  YOUR PLAN:  -TYPE 2 DIABETES MELLITUS: Type 2 diabetes is a condition where your body does not use insulin  properly, leading to high blood sugar levels. Your recent A1c was 10.9%, indicating poor blood sugar control. We are starting you on Ozempic  0.25 mg weekly to help manage your blood sugar and potentially aid in weight loss. Continue monitoring your blood glucose levels closely. If your blood sugar drops too low, reduce your Lantus  dose from 27 units to 25 units, and further to 23 units if necessary. We will follow up in one month to assess your blood sugar control and A1c levels. Remember to take Ozempic  consistently each week and watch for signs of low blood sugar.  -ESSENTIAL HYPERTENSION: Essential hypertension is high blood pressure with no identifiable cause. Your blood pressure has been elevated since your hospital discharge. We are switching your metoprolol  to carvedilol  and continuing valsartan . Please follow a low sodium diet and we will recheck your blood pressure in one month.  -LOWER EXTREMITY AMPUTATION: You had a foot amputation due to a severe infection. You are managing well with the help of home health aides. No immediate follow-up with orthopedics is required unless issues arise.  INSTRUCTIONS:  Follow up in one month to assess blood glucose control, A1c levels, and blood pressure. Continue monitoring your blood glucose levels closely and adjust your insulin  dose as needed. Maintain a low sodium diet and monitor your blood pressure regularly.

## 2024-04-27 NOTE — Progress Notes (Signed)
 Subjective:  Patient ID: Taylor Hughes, male    DOB: [birth year]  Age: 53 y.o. MRN: [id number]  CC: Follow-up (Follow-up. /Discuss restarting Ozempic  /Discuss metoprolol  /No to flu vax)     Discussed the use of AI scribe software for clinical note transcription with the patient, who gave verbal consent to proceed.  History of Present Illness Taylor Hughes is a 53 year old male with a history of Type 2 diabetes mellitus (A1c of 9.8), L fifth toe ray amputation, hypertension, hyperlipidemia, Lymphedema, left BKA (in 02/2024) who presents for follow-up.  He underwent a left  BKA due to a severe infection that originated from a wound on the bottom of his foot. The infection worsened during a period of personal neglect while his significant other was hospitalized. His daughter intervened when the infection became critical.  He has diabetes with a recent A1c of 10.9%. He monitors his blood sugar three times daily, with morning readings between 90-100 mg/dL and occasional lows of 67 mg/dL. Daytime readings have not exceeded 200 mg/dL, with a recent high of 190 mg/dL. He is on Lantus  27 units daily and Humalog  0-12 units three times a day with meals on a sliding scale.  He experiences elevated blood pressure since hospital discharge, despite taking metoprolol  25 mg and valsartan  160mg  . He questions the efficacy of metoprolol , as his blood pressure was previously normal. He also takes iron supplements.  He and his significant other have aides assisting him at home.    Past Medical History:  Diagnosis Date   Atrial fibrillation (HCC)    Diabetes mellitus without complication (HCC)    Type 2   GERD (gastroesophageal reflux disease)    Hyperlipidemia    Hypertension    Morbid (severe) obesity due to excess calories (HCC) 08/07/2023   bmi 44.63   Wears glasses    Wound, open, foot    left diabetic     Past Surgical History:  Procedure Laterality Date   AMPUTATION Left 08/20/2018   Procedure:  LEFT FOOT IRRIGATON AND DEBRIDEMENT, 5TH RAY AMPUTATION;  Surgeon: Harden Jerona GAILS, MD;  Location: [facility] OR;  Service: Orthopedics;  Laterality: Left;   AMPUTATION Right 03/25/2023   Procedure: RIGHT TRANSMETATARSAL AMPUTATION;  Surgeon: Harden Jerona GAILS, MD;  Location: [facility] OR;  Service: Orthopedics;  Laterality: Right;   AMPUTATION Left 02/26/2024   Procedure: AMPUTATION BELOW KNEE, LEFT;  Surgeon: Harden Jerona GAILS, MD;  Location: [facility] OR;  Service: Orthopedics;  Laterality: Left;  WOUND VAC APPLIED   APPLICATION OF A-CELL OF CHEST/ABDOMEN N/A 05/30/2019   Procedure: Application Of A-Cell Of Chest;  Surgeon: Shyrl Linnie KIDD, MD;  Location: [facility] OR;  Service: Cardiothoracic;  Laterality: N/A;   APPLICATION OF WOUND VAC N/A 05/30/2019   Procedure: Application Of Wound Vac;  Surgeon: Shyrl Linnie KIDD, MD;  Location: [facility] OR;  Service: Cardiothoracic;  Laterality: N/A;   APPLICATION OF WOUND VAC Left 02/26/2024   Procedure: APPLICATION, WOUND VAC;  Surgeon: Harden Jerona GAILS, MD;  Location: [facility] OR;  Service: Orthopedics;  Laterality: Left;   ESOPHAGOGASTRODUODENOSCOPY N/A 02/25/2024   Procedure: EGD (ESOPHAGOGASTRODUODENOSCOPY);  Surgeon: Suzann Inocente HERO, MD;  Location: [facility] ENDOSCOPY;  Service: Gastroenterology;  Laterality: N/A;   FINGER SURGERY Right 2016   I&D  small finger   I & D EXTREMITY Left 07/30/2018   Procedure: IRRIGATION AND DEBRIDEMENT LEFT FOOT WITH POSSIBLE AMPUTATION OF FIFTH TOE;  Surgeon: Vernetta Lonni GRADE, MD;  Location: [facility] ORS;  Service: Orthopedics;  Laterality: Left;  INCISION AND DRAINAGE ABSCESS Left 01/19/2019   Procedure: INCISION AND DRAINAGE ABSCESS upper chest;  Surgeon: Carlie Clark, MD;  Location: [facility] OR;  Service: ENT;  Laterality: Left;   IRRIGATION AND DEBRIDEMENT STERNOCLAVICULAR JOINT-STERNUM AND RIBS N/A 05/30/2019   Procedure: IRRIGATION AND DEBRIDEMENT OF STERNOCLAVICULAR JOINT-STERNUM AND RIBS ;  Surgeon: Shyrl Linnie KIDD, MD;  Location: [facility] OR;  Service:  Cardiothoracic;  Laterality: N/A;   MINOR IRRIGATION AND DEBRIDEMENT OF WOUND N/A 11/22/2018   Procedure: INCISION AND DRAINAGE OF NECK ABSCESS;  Surgeon: Carlie Clark, MD;  Location: [facility] ORS;  Service: ENT;  Laterality: N/A;    Family History  Problem Relation Age of Onset   Diabetes Mother    Colon cancer Brother    Esophageal cancer Neg Hx    Rectal cancer Neg Hx    Stomach cancer Neg Hx     Social History   Socioeconomic History   Marital status: Divorced    Spouse name: Not on file   Number of children: Not on file   Years of education: Not on file   Highest education level: 11th grade  Occupational History   Not on file  Tobacco Use   Smoking status: Never   Smokeless tobacco: Never  Vaping Use   Vaping status: Never Used  Substance and Sexual Activity   Alcohol use: No    Alcohol/week: 0.0 standard drinks of alcohol   Drug use: No   Sexual activity: Not on file  Other Topics Concern   Not on file  Social History Narrative   Not on file   Social Drivers of Health   Financial Resource Strain: Medium Risk (06/24/2023)   Overall Financial Resource Strain (CARDIA)    Difficulty of Paying Living Expenses: Somewhat hard  Food Insecurity: Food Insecurity Present (02/24/2024)   Hunger Vital Sign    Worried About Running Out of Food in the Last Year: Sometimes true    Ran Out of Food in the Last Year: Sometimes true  Transportation Needs: No Transportation Needs (02/24/2024)   PRAPARE - Administrator, Civil Service (Medical): No    Lack of Transportation (Non-Medical): No  Physical Activity: Unknown (06/24/2023)   Exercise Vital Sign    Days of Exercise per Week: 0 days    Minutes of Exercise per Session: Not on file  Stress: No Stress Concern Present (06/24/2023)   [employer] of Occupational Health - Occupational Stress Questionnaire    Feeling of Stress : Only a little  Social Connections: Socially Isolated (06/24/2023)   Social Connection  and Isolation Panel    Frequency of Communication with Friends and Family: Twice a week    Frequency of Social Gatherings with Friends and Family: Once a week    Attends Religious Services: Never    Database administrator or Organizations: No    Attends Engineer, structural: Not on file    Marital Status: Divorced    No Known Allergies  Outpatient Medications Prior to Visit  Medication Sig Dispense Refill   Accu-Chek Softclix Lancets lancets Use to check blood sugar 3 times daily. 100 each 6   acetaminophen  (TYLENOL ) 500 MG tablet Take 2 tablets (1,000 mg total) by mouth every 8 (eight) hours.     acidophilus (RISAQUAD) CAPS capsule Take 2 capsules by mouth 3 (three) times daily.     Continuous Glucose Receiver (DEXCOM G7 RECEIVER) DEVI Use to test blood sugar 1 each 0   Continuous Glucose Sensor (DEXCOM G7  SENSOR) MISC Use to test blood sugar 3 each 6   glucose blood (ACCU-CHEK GUIDE TEST) test strip use as directed 3 (three) times daily. 100 strip 5   insulin  glargine (LANTUS  SOLOSTAR) 100 UNIT/ML Solostar Pen Inject 27 Units into the skin daily. 30 mL 1   insulin  lispro (HUMALOG  KWIKPEN) 100 UNIT/ML KwikPen Inject 0-12 units into the skin three times daily with meals. Per sliding scale. 12 mL 3   Multiple Vitamin (MULTIVITAMIN WITH MINERALS) TABS tablet Take 1 tablet by mouth daily.     oxyCODONE  (ROXICODONE ) 15 MG immediate release tablet Take 1 tablet (15 mg total) by mouth every 4 (four) hours as needed for severe pain (pain score 7-10). 30 tablet 0   oxyCODONE -acetaminophen  (PERCOCET/ROXICET) 5-325 MG tablet Take 1 tablet by mouth every 6 (six) hours as needed. 30 tablet 0   oxyCODONE -acetaminophen  (PERCOCET/ROXICET) 5-325 MG tablet Take 1 tablet by mouth every 6 (six) hours as needed. 19 tablet 0   pantoprazole  (PROTONIX ) 40 MG tablet Take 1 tablet (40 mg total) by mouth daily. 30 tablet 0   simethicone  (MYLICON) 80 MG chewable tablet Chew 1 tablet (80 mg total) by mouth 4  (four) times daily as needed for flatulence. 30 tablet 0   atorvastatin  (LIPITOR) 20 MG tablet Take 1 tablet (20 mg total) by mouth at bedtime. 30 tablet 0   ferrous sulfate  325 (65 FE) MG tablet Take 1 tablet (325 mg total) by mouth daily with breakfast. 30 tablet 0   gabapentin  (NEURONTIN ) 300 MG capsule Take 1 capsule (300 mg total) by mouth 3 (three) times daily. 90 capsule 0   metoprolol  tartrate (LOPRESSOR ) 50 MG tablet Take 1 tablet (50 mg total) by mouth 2 (two) times daily. 60 tablet 0   sildenafil  (VIAGRA ) 100 MG tablet Take 1 tablet (100 mg total) by mouth daily as needed for erectile dysfunction. At least 24 hours between doses 10 tablet 0   valsartan  (DIOVAN ) 160 MG tablet Take 1 tablet (160 mg total) by mouth daily. 30 tablet 0   No facility-administered medications prior to visit.     ROS Review of Systems  Constitutional:  Negative for activity change and appetite change.  HENT:  Negative for sinus pressure and sore throat.   Respiratory:  Negative for chest tightness, shortness of breath and wheezing.   Cardiovascular:  Positive for leg swelling. Negative for chest pain and palpitations.  Gastrointestinal:  Negative for abdominal distention, abdominal pain and constipation.  Genitourinary: Negative.   Musculoskeletal: Negative.   Psychiatric/Behavioral:  Negative for behavioral problems and dysphoric mood.     Objective:  BP (!) 162/97   Pulse 86   Temp 97.7 F (36.5 C) (Oral)   SpO2 100%      04/27/2024    9:37 AM 04/27/2024    9:09 AM 04/14/2024   10:30 AM  BP/Weight  Systolic BP 162 154 151  Diastolic BP 97 96 90      Physical Exam Constitutional:      Appearance: He is well-developed.  Cardiovascular:     Rate and Rhythm: Normal rate.     Heart sounds: Normal heart sounds. No murmur heard. Pulmonary:     Effort: Pulmonary effort is normal.     Breath sounds: Normal breath sounds. No wheezing or rales.  Chest:     Chest wall: No tenderness.   Abdominal:     General: Bowel sounds are normal. There is no distension.     Palpations: Abdomen is soft.  There is no mass.     Tenderness: There is no abdominal tenderness.  Musculoskeletal:     Right lower leg: Edema present.     Comments: Left BKA  Neurological:     Mental Status: He is alert and oriented to person, place, and time.  Psychiatric:        Mood and Affect: Mood normal.        Latest Ref Rng & Units 03/03/2024    5:03 AM 03/02/2024    8:52 AM 02/29/2024    6:50 AM  CMP  Glucose 70 - 99 mg/dL 745  665  750   BUN 6 - 20 mg/dL 28  28  26    Creatinine 0.61 - 1.24 mg/dL 9.08  9.03  9.08   Sodium 135 - 145 mmol/L 135  133  137   Potassium 3.5 - 5.1 mmol/L 4.4  4.3  3.6   Chloride 98 - 111 mmol/L 106  106  105   CO2 22 - 32 mmol/L 20  17  22    Calcium  8.9 - 10.3 mg/dL 8.0  8.0  8.1     Lipid Panel     Component Value Date/Time   CHOL 153 11/11/2023 0319   CHOL 173 05/09/2022 1359   TRIG 61 11/11/2023 0319   HDL 60 11/11/2023 0319   HDL 71 05/09/2022 1359   CHOLHDL 2.6 11/11/2023 0319   VLDL 12 11/11/2023 0319   LDLCALC 81 11/11/2023 0319   LDLCALC 92 05/09/2022 1359    CBC    Component Value Date/Time   WBC 8.2 03/02/2024 0852   RBC 3.44 (L) 03/02/2024 0852   HGB 9.3 (L) 03/02/2024 0852   HGB 11.1 (L) 06/25/2023 1537   HCT 30.1 (L) 03/02/2024 0852   HCT 35.4 (L) 06/25/2023 1537   PLT 347 03/02/2024 0852   PLT 302 06/25/2023 1537   MCV 87.5 03/02/2024 0852   MCV 92 06/25/2023 1537   MCH 27.0 03/02/2024 0852   MCHC 30.9 03/02/2024 0852   RDW 18.0 (H) 03/02/2024 0852   RDW 14.5 06/25/2023 1537   LYMPHSABS 0.7 02/21/2024 2130   LYMPHSABS 1.3 06/25/2023 1537   MONOABS 0.3 02/21/2024 2130   EOSABS 0.0 02/21/2024 2130   EOSABS 0.1 06/25/2023 1537   BASOSABS 0.1 02/21/2024 2130   BASOSABS 0.0 06/25/2023 1537    Lab Results  Component Value Date   HGBA1C 10.9 (H) 02/22/2024       Assessment & Plan Type 2 diabetes mellitus with  neuropathy Type 2 diabetes mellitus with recent A1c of 10.9%. Blood glucose levels below 200 mg/dL since discharge. Currently on insulin  glargine and insulin  lispro. Interested in starting Ozempic  for better glycemic control and potential weight loss. - Start Ozempic  0.25 mg weekly. Send prescription to [facility]. - Monitor blood glucose levels closely. If blood glucose drops too low, reduce insulin  glargine from 27 units to 25 units, and further to 23 units if necessary. - Follow up in one month to assess blood glucose control and A1c levels. - Educated on the importance of consistent weekly dosing of Ozempic  and monitoring for hypoglycemia. - Next A1c is due on 05/24/2024  Hypertension associated with type 2 diabetes mellitus Essential hypertension with elevated blood pressure during the visit. Currently on metoprolol  and valsartan . Reports increased blood pressure since hospital discharge and questions metoprolol  efficacy. -Repeat blood pressure still elevated - Switch metoprolol  to carvedilol . - Continue valsartan . - Advise on a low sodium diet. - Recheck blood pressure  in one month.  Left BKA Status post lower extremity amputation  Managing well with home health aide support. - No immediate orthopedic follow-up required unless issues arise.   Erectile dysfunction - Viagra  refilled  On statin therapy due to increased risk of cardiovascular disease - Continue statin   Meds ordered this encounter  Medications   atorvastatin  (LIPITOR) 20 MG tablet    Sig: Take 1 tablet (20 mg total) by mouth at bedtime.    Dispense:  90 tablet    Refill:  1   ferrous sulfate  325 (65 FE) MG tablet    Sig: Take 1 tablet (325 mg total) by mouth daily with breakfast.    Dispense:  30 tablet    Refill:  0   gabapentin  (NEURONTIN ) 300 MG capsule    Sig: Take 1 capsule (300 mg total) by mouth 3 (three) times daily.    Dispense:  270 capsule    Refill:  1   valsartan  (DIOVAN ) 160 MG  tablet    Sig: Take 1 tablet (160 mg total) by mouth daily.    Dispense:  90 tablet    Refill:  1    Discontinue valsartan /HCTZ   sildenafil  (VIAGRA ) 100 MG tablet    Sig: Take 1 tablet (100 mg total) by mouth daily as needed for erectile dysfunction. At least 24 hours between doses    Dispense:  10 tablet    Refill:  0   Semaglutide ,0.25 or 0.5MG /DOS, (OZEMPIC , 0.25 OR 0.5 MG/DOSE,) 2 MG/1.5ML SOPN    Sig: Inject 0.25 mg into the skin once a week. For 4 weeks then increase to 0.5mg     Dispense:  2 mL    Refill:  1   carvedilol  (COREG ) 3.125 MG tablet    Sig: Take 1 tablet (3.125 mg total) by mouth 2 (two) times daily with a meal.    Dispense:  180 tablet    Refill:  1    Discontinue Metoprolol     Follow-up: Return in about 1 month (around 05/27/2024) for Diabetes follow-up with PCP.       Corrina Sabin, MD, FAAFP. [facility] [place], [place] [phone]   04/27/2024, 10:50 AM

## 2024-04-27 NOTE — Telephone Encounter (Signed)
 Form has been completed and will be faxed once PCP completes office note from today's visit.

## 2024-04-28 ENCOUNTER — Other Ambulatory Visit (HOSPITAL_COMMUNITY): Payer: Self-pay

## 2024-04-28 ENCOUNTER — Other Ambulatory Visit: Payer: Self-pay

## 2024-04-28 ENCOUNTER — Other Ambulatory Visit: Payer: Self-pay | Admitting: Orthopedic Surgery

## 2024-04-28 ENCOUNTER — Other Ambulatory Visit: Payer: Self-pay | Admitting: Physician Assistant

## 2024-04-28 ENCOUNTER — Telehealth: Payer: Self-pay

## 2024-04-28 DIAGNOSIS — S88112A Complete traumatic amputation at level between knee and ankle, left lower leg, initial encounter: Secondary | ICD-10-CM

## 2024-04-28 DIAGNOSIS — Z89512 Acquired absence of left leg below knee: Secondary | ICD-10-CM | POA: Diagnosis not present

## 2024-04-28 MED ORDER — OXYCODONE-ACETAMINOPHEN 5-325 MG PO TABS
1.0000 | ORAL_TABLET | Freq: Four times a day (QID) | ORAL | 0 refills | Status: DC | PRN
  Filled 2024-04-28: qty 30, 8d supply, fill #0

## 2024-04-28 MED ORDER — OXYCODONE-ACETAMINOPHEN 5-325 MG PO TABS: 1.0000 | ORAL_TABLET | Freq: Four times a day (QID) | ORAL | 0 refills | Status: DC | PRN

## 2024-04-28 NOTE — Telephone Encounter (Signed)
 Patient would like a Rx refill for Oxycodone  sent to his pharmacy.  Cb# [id number].  Please advise.  Thank you.

## 2024-04-29 ENCOUNTER — Other Ambulatory Visit: Payer: Self-pay

## 2024-04-29 ENCOUNTER — Telehealth: Payer: Self-pay

## 2024-04-29 DIAGNOSIS — Z89512 Acquired absence of left leg below knee: Secondary | ICD-10-CM | POA: Diagnosis not present

## 2024-04-29 DIAGNOSIS — Z794 Long term (current) use of insulin: Secondary | ICD-10-CM

## 2024-04-29 MED ORDER — OZEMPIC (0.25 OR 0.5 MG/DOSE) 2 MG/1.5ML ~~LOC~~ SOPN: 0.2500 mg | PEN_INJECTOR | SUBCUTANEOUS | 1 refills | Status: DC

## 2024-04-29 NOTE — Telephone Encounter (Signed)
 Noted

## 2024-04-29 NOTE — Telephone Encounter (Signed)
 Copied from CRM [id number]. Topic: Clinical - Prescription Issue >> Apr 28, 2024 10:32 AM Tinnie BROCKS wrote: Reason for CRM: Pt stating that he wanted his Ozempic  sent to [id number] - [place], [place] - [address] [[id number]] It was sent to the transitions [facility] care pharmacy and they say they are unable to fill it.  Please call pt for any questions/issues at [phone].

## 2024-04-29 NOTE — Telephone Encounter (Signed)
Can we start a PA for this medication?

## 2024-04-29 NOTE — Telephone Encounter (Signed)
 Copied from CRM [id number]. Topic: Clinical - Prescription Issue >> Apr 29, 2024  2:32 PM Uyopbjy D wrote:  Semaglutide ,0.25 or 0.5MG /DOS, (OZEMPIC , 0.25 OR 0.5 MG/DOSE,) 2 MG/1.5ML SOPN [[id number]- pt says his prescription was denied

## 2024-04-29 NOTE — Telephone Encounter (Signed)
 [facility]

## 2024-04-29 NOTE — Telephone Encounter (Signed)
 Pharmacy Patient Advocate Encounter  Received notification from [facility] MEDICAID that Prior Authorization for OZEMPIC  has been APPROVED from 04/29/2024 to 04/29/2025

## 2024-04-29 NOTE — Telephone Encounter (Signed)
 The patient called in to check on the status of his medication and I told him the prior authorization has been sent in now just waiting for it to be approved. He was very appreciative and hopeful it will be soon.

## 2024-05-01 DIAGNOSIS — Z89512 Acquired absence of left leg below knee: Secondary | ICD-10-CM | POA: Diagnosis not present

## 2024-05-01 DIAGNOSIS — Z993 Dependence on wheelchair: Secondary | ICD-10-CM | POA: Diagnosis not present

## 2024-05-01 DIAGNOSIS — T8189XA Other complications of procedures, not elsewhere classified, initial encounter: Secondary | ICD-10-CM | POA: Diagnosis not present

## 2024-05-02 DIAGNOSIS — Z89512 Acquired absence of left leg below knee: Secondary | ICD-10-CM | POA: Diagnosis not present

## 2024-05-03 ENCOUNTER — Telehealth: Payer: Self-pay

## 2024-05-03 DIAGNOSIS — Z89512 Acquired absence of left leg below knee: Secondary | ICD-10-CM | POA: Diagnosis not present

## 2024-05-03 NOTE — Telephone Encounter (Addendum)
 Caller states received an incomplete document for incontinence supplies. Form faxed back on 9/19 to  [phone], caller is re faxing form again today, please note when received and the status.   Please see below the following information missed from the form:  Question #19.  patient height and weight.  both dx need a date off onset a specific year and one of the the diagnosis need to be checked as a primary diagnosis.  Question #21 was missed.

## 2024-05-03 NOTE — Telephone Encounter (Signed)
 Noted

## 2024-05-03 NOTE — Telephone Encounter (Signed)
 Recert for PCS emailed to [email or web address]

## 2024-05-04 DIAGNOSIS — Z89512 Acquired absence of left leg below knee: Secondary | ICD-10-CM | POA: Diagnosis not present

## 2024-05-05 DIAGNOSIS — Z89512 Acquired absence of left leg below knee: Secondary | ICD-10-CM | POA: Diagnosis not present

## 2024-05-06 ENCOUNTER — Telehealth: Payer: Self-pay | Admitting: Orthopedic Surgery

## 2024-05-06 ENCOUNTER — Ambulatory Visit: Payer: Self-pay | Admitting: *Deleted

## 2024-05-06 DIAGNOSIS — Z89512 Acquired absence of left leg below knee: Secondary | ICD-10-CM | POA: Diagnosis not present

## 2024-05-06 NOTE — Telephone Encounter (Signed)
 Pt is s/p left BKA requesting refill on Oxycodone  5/325 last refills were 04/28/2024 #30 and 9/9 #19 please advise.

## 2024-05-06 NOTE — Telephone Encounter (Signed)
 FYI Only or Action Required?: FYI only for provider.  Patient was last seen in primary care on 04/27/2024 by Newlin, Enobong, MD.  Called Nurse Triage reporting Neurologic Problem.  Symptoms began several months ago.  Interventions attempted: Nothing.  Symptoms are: unchanged.  Triage Disposition: See PCP Within 2 Weeks  Patient/caregiver understands and will follow disposition?: yes   Reason for Disposition  Face pain is a chronic symptom (recurrent or ongoing AND present > 4 weeks)  Answer Assessment - Initial Assessment Questions 1. SYMPTOM: What is the main symptom you are concerned about? (e.g., weakness, numbness)     Shocking feeling beside nose and cheek, touching the area, moving lips- tingling, shocking- right 2. ONSET: When did this start? (e.g., minutes, hours, days; while sleeping)     1 1/2 month 3. LAST NORMAL: When was the last time you (the patient) were normal (no symptoms)?     1 1/2 months ago 4. PATTERN Does this come and go, or has it been constant since it started?  Is it present now?     Comes and goes 5. CARDIAC SYMPTOMS: Have you had any of the following symptoms: chest pain, difficulty breathing, palpitations?     no 6. NEUROLOGIC SYMPTOMS: Have you had any of the following symptoms: headache, dizziness, vision loss, double vision, changes in speech, unsteady on your feet?     no 7. OTHER SYMPTOMS: Do you have any other symptoms?     no  Answer Assessment - Initial Assessment Questions 1. ONSET: When did the pain start? (e.g., minutes, hours, days)     Shocking sensation with touch or movement 2. ONSET: Does the pain come and go, or has it been constant since it started? (e.g., constant, intermittent, fleeting)     Started 1 1/2 months agoComes and goes 3. SEVERITY: How bad is the pain? (Scale 1-10; mild, moderate or severe)     Can be severe 4. LOCATION: Where does it hurt?      R cheek/beside nose area  7. OTHER  SYMPTOMS: Do you have any other symptoms? (e.g., fever, toothache, nasal discharge, nasal congestion, clicking sensation in jaw joint)     none  Protocols used: Neurologic Deficit-A-AH, Face Pain-A-AH   Copied from CRM #[id number]. Topic: Clinical - Red Word Triage >> May 06, 2024  8:47 AM Willma R wrote: Red Word that prompted transfer to Nurse Triage: Patient states any time he touches the right side of his face next to his nose or cheek bone he gets the sensation of an electric shock. Has been ongoing for about a month and a half, but has gotten a worse.

## 2024-05-06 NOTE — Telephone Encounter (Signed)
 Patient called and needs a refill on Oxycodone. CB#[phone]

## 2024-05-06 NOTE — Telephone Encounter (Signed)
 Noted

## 2024-05-09 ENCOUNTER — Other Ambulatory Visit: Payer: Self-pay | Admitting: Physician Assistant

## 2024-05-09 NOTE — Telephone Encounter (Signed)
 Pt has an appt in the office tomorrow

## 2024-05-10 ENCOUNTER — Ambulatory Visit (INDEPENDENT_AMBULATORY_CARE_PROVIDER_SITE_OTHER): Admitting: Family

## 2024-05-10 DIAGNOSIS — R3981 Functional urinary incontinence: Secondary | ICD-10-CM | POA: Diagnosis not present

## 2024-05-10 DIAGNOSIS — I890 Lymphedema, not elsewhere classified: Secondary | ICD-10-CM

## 2024-05-10 DIAGNOSIS — Z89512 Acquired absence of left leg below knee: Secondary | ICD-10-CM

## 2024-05-10 DIAGNOSIS — S88112D Complete traumatic amputation at level between knee and ankle, left lower leg, subsequent encounter: Secondary | ICD-10-CM

## 2024-05-10 DIAGNOSIS — Z993 Dependence on wheelchair: Secondary | ICD-10-CM | POA: Diagnosis not present

## 2024-05-10 NOTE — Progress Notes (Signed)
 Office Visit Note   Patient: Taylor Hughes           Date of Birth: 10/31/1970           MRN: [id number] Visit Date: 05/10/2024              Requested by: Delbert Clam, MD [address] [place],  [place] [address] PCP: Delbert Clam, MD  Chief Complaint  Patient presents with   Left Leg - Routine Post Op    02/26/24 left BKA      HPI: The patient is a 53 year old gentleman who presents status post left below-knee amputation July 18.  He reports he has 2 retained staples   Has an appointment at [facility] today for modifications to his socket he expects delivery of his prosthesis on October 17  Assessment & Plan: Visit Diagnoses: No diagnosis found.  Plan: proceed with prosthesis setup. Follow up as needed.   Follow-Up Instructions: No follow-ups on file.   Ortho Exam  Patient is alert, oriented, no adenopathy, well-dressed, normal affect, normal respiratory effort. On examination left residual limb his incision has well-healed there is no gaping or drainage there are 2 retained staples these were harvested without incident.  Limb is consolidating well    Imaging: No results found. No images are attached to the encounter.  Labs: Lab Results  Component Value Date   HGBA1C 10.9 (H) 02/22/2024   HGBA1C 9.1 (H) 11/11/2023   HGBA1C 7.9 (A) 06/25/2023   ESRSEDRATE >140 (H) 03/22/2023   CRP 9.7 (H) 03/29/2023   CRP 15.7 (H) 03/28/2023   CRP 19.2 (H) 03/27/2023   REPTSTATUS 02/27/2024 FINAL 02/22/2024   REPTSTATUS 02/27/2024 FINAL 02/22/2024   GRAMSTAIN NO WBC SEEN RARE GRAM POSITIVE COCCI IN PAIRS  03/25/2023   CULT  02/22/2024    NO GROWTH 5 DAYS Performed at [facility], [address], [place], [place] [address]    CULT  02/22/2024    NO GROWTH 5 DAYS Performed at [facility], [address] [place], [place] [address]    LABORGA GROUP B STREP(S.AGALACTIAE)ISOLATED 02/21/2024     Lab Results  Component Value Date   ALBUMIN   1.7 (L) 02/22/2024   ALBUMIN  2.0 (L) 02/21/2024   ALBUMIN  1.8 (L) 11/12/2023    Lab Results  Component Value Date   MG 2.3 02/23/2024   MG 2.6 (H) 02/22/2024   MG 2.0 11/13/2023   No results found for: VD25OH  No results found for: PREALBUMIN    Latest Ref Rng & Units 03/02/2024    8:52 AM 02/28/2024    7:40 AM 02/27/2024    4:00 AM  CBC EXTENDED  WBC 4.0 - 10.5 K/uL 8.2  7.1  10.4   RBC 4.22 - 5.81 MIL/uL 3.44  3.70  3.59   Hemoglobin 13.0 - 17.0 g/dL 9.3  89.9  9.6   HCT 60.9 - 52.0 % 30.1  33.0  30.8   Platelets 150 - 400 K/uL 347  366  344      There is no height or weight on file to calculate BMI.  Orders:  No orders of the defined types were placed in this encounter.  No orders of the defined types were placed in this encounter.    Procedures: No procedures performed  Clinical Data: No additional findings.  ROS:  All other systems negative, except as noted in the HPI. Review of Systems  Objective: Vital Signs: There were no vitals taken  for this visit.  Specialty Comments:  No specialty comments available.  PMFS History: Patient Active Problem List   Diagnosis Date Noted   Bacteremia 04/14/2024   Medication management 04/14/2024   Streptococcus group B infection 04/14/2024   Cutaneous abscess of left foot 02/25/2024   Erosive esophagitis 02/25/2024   Duodenitis 02/25/2024   Iron deficiency anemia 02/24/2024   Coffee ground emesis 02/23/2024   Encephalopathy acute 02/22/2024   Non-pressure chronic ulcer of other part of left foot limited to breakdown of skin (HCC) 11/13/2023   Charcot joint of left foot 11/13/2023   Acute pyelonephritis 11/11/2023   DKA, type 2 (HCC) 11/10/2023   Flank pain 11/10/2023   Left thigh pain 11/10/2023   Neck pain 11/10/2023   Elevated troponin 11/10/2023   Lactic acidosis 11/10/2023   Acute cystitis with hematuria 06/25/2023   History of transmetatarsal amputation of right foot (HCC) 04/27/2023   Lymphedema  03/24/2023   Right ventricular enlargement 03/24/2023   Diabetic foot infection (HCC) 03/23/2023   Subacute osteomyelitis, left ankle and foot (HCC) 03/22/2023   Atrial fibrillation, chronic (HCC) 03/22/2023   HLD (hyperlipidemia) 03/22/2023   Gastroparesis 03/22/2023   Depression 09/08/2022   GERD (gastroesophageal reflux disease) 03/03/2022   Diabetes mellitus (HCC) 09/19/2021   PAF (paroxysmal atrial fibrillation) (HCC)    Acute on chronic anemia    Venous insufficiency (chronic) (peripheral) 03/15/2019   Edema of both lower extremities due to peripheral venous insufficiency 03/15/2019   Essential hypertension 11/23/2018   AKI (acute kidney injury) 11/23/2018   Morbid obesity with BMI of 45.0-49.9, adult (HCC) 11/23/2018   Type 2 diabetes mellitus with other specified complication (HCC) 11/23/2018   Diabetic infection of left foot (HCC)    DKA (diabetic ketoacidosis) (HCC) 07/30/2018   DMII (diabetes mellitus, type 2) (HCC) 07/28/2018   Past Medical History:  Diagnosis Date   Atrial fibrillation (HCC)    Diabetes mellitus without complication (HCC)    Type 2   GERD (gastroesophageal reflux disease)    Hyperlipidemia    Hypertension    Morbid (severe) obesity due to excess calories (HCC) 08/07/2023   bmi 44.63   Wears glasses    Wound, open, foot    left diabetic     Family History  Problem Relation Age of Onset   Diabetes Mother    Colon cancer Brother    Esophageal cancer Neg Hx    Rectal cancer Neg Hx    Stomach cancer Neg Hx     Past Surgical History:  Procedure Laterality Date   AMPUTATION Left 08/20/2018   Procedure: LEFT FOOT IRRIGATON AND DEBRIDEMENT, 5TH RAY AMPUTATION;  Surgeon: Harden Jerona GAILS, MD;  Location: [facility] OR;  Service: Orthopedics;  Laterality: Left;   AMPUTATION Right 03/25/2023   Procedure: RIGHT TRANSMETATARSAL AMPUTATION;  Surgeon: Harden Jerona GAILS, MD;  Location: [facility] OR;  Service: Orthopedics;  Laterality: Right;   AMPUTATION Left 02/26/2024    Procedure: AMPUTATION BELOW KNEE, LEFT;  Surgeon: Harden Jerona GAILS, MD;  Location: [facility] OR;  Service: Orthopedics;  Laterality: Left;  WOUND VAC APPLIED   APPLICATION OF A-CELL OF CHEST/ABDOMEN N/A 05/30/2019   Procedure: Application Of A-Cell Of Chest;  Surgeon: Shyrl Linnie KIDD, MD;  Location: [facility] OR;  Service: Cardiothoracic;  Laterality: N/A;   APPLICATION OF WOUND VAC N/A 05/30/2019   Procedure: Application Of Wound Vac;  Surgeon: Shyrl Linnie KIDD, MD;  Location: [facility] OR;  Service: Cardiothoracic;  Laterality: N/A;   APPLICATION OF WOUND VAC Left 02/26/2024  Procedure: APPLICATION, WOUND VAC;  Surgeon: Harden Jerona GAILS, MD;  Location: [facility] OR;  Service: Orthopedics;  Laterality: Left;   ESOPHAGOGASTRODUODENOSCOPY N/A 02/25/2024   Procedure: EGD (ESOPHAGOGASTRODUODENOSCOPY);  Surgeon: Suzann Inocente HERO, MD;  Location: [facility];  Service: Gastroenterology;  Laterality: N/A;   FINGER SURGERY Right 2016   I&D  small finger   I & D EXTREMITY Left 07/30/2018   Procedure: IRRIGATION AND DEBRIDEMENT LEFT FOOT WITH POSSIBLE AMPUTATION OF FIFTH TOE;  Surgeon: Vernetta Lonni GRADE, MD;  Location: [facility] ORS;  Service: Orthopedics;  Laterality: Left;   INCISION AND DRAINAGE ABSCESS Left 01/19/2019   Procedure: INCISION AND DRAINAGE ABSCESS upper chest;  Surgeon: Carlie Clark, MD;  Location: [facility] OR;  Service: ENT;  Laterality: Left;   IRRIGATION AND DEBRIDEMENT STERNOCLAVICULAR JOINT-STERNUM AND RIBS N/A 05/30/2019   Procedure: IRRIGATION AND DEBRIDEMENT OF STERNOCLAVICULAR JOINT-STERNUM AND RIBS ;  Surgeon: Shyrl Linnie KIDD, MD;  Location: [facility] OR;  Service: Cardiothoracic;  Laterality: N/A;   MINOR IRRIGATION AND DEBRIDEMENT OF WOUND N/A 11/22/2018   Procedure: INCISION AND DRAINAGE OF NECK ABSCESS;  Surgeon: Carlie Clark, MD;  Location: [facility] ORS;  Service: ENT;  Laterality: N/A;   Social History   Occupational History   Not on file  Tobacco Use   Smoking status: Never   Smokeless tobacco: Never   Vaping Use   Vaping status: Never Used  Substance and Sexual Activity   Alcohol use: No    Alcohol/week: 0.0 standard drinks of alcohol   Drug use: No   Sexual activity: Not on file

## 2024-05-11 ENCOUNTER — Other Ambulatory Visit: Payer: Self-pay

## 2024-05-11 ENCOUNTER — Ambulatory Visit: Admitting: Gastroenterology

## 2024-05-11 ENCOUNTER — Telehealth: Payer: Self-pay | Admitting: Family

## 2024-05-11 DIAGNOSIS — S88112A Complete traumatic amputation at level between knee and ankle, left lower leg, initial encounter: Secondary | ICD-10-CM

## 2024-05-11 DIAGNOSIS — Z89512 Acquired absence of left leg below knee: Secondary | ICD-10-CM | POA: Diagnosis not present

## 2024-05-11 MED ORDER — OXYCODONE-ACETAMINOPHEN 5-325 MG PO TABS
1.0000 | ORAL_TABLET | Freq: Three times a day (TID) | ORAL | 0 refills | Status: DC | PRN
  Filled 2024-05-11: qty 30, 10d supply, fill #0

## 2024-05-11 NOTE — Telephone Encounter (Signed)
 Pt called requesting refill of oxycodone . Please send to [facility] Pt phone number is [phone].

## 2024-05-11 NOTE — Telephone Encounter (Signed)
 Pt was in the office yesterday s/p a BKA and is asking for a refill onf his Oxycodone . It looks like there are several rx for September in his chart. Please advise.

## 2024-05-12 ENCOUNTER — Other Ambulatory Visit: Payer: Self-pay

## 2024-05-12 ENCOUNTER — Other Ambulatory Visit: Payer: Self-pay | Admitting: Family

## 2024-05-12 DIAGNOSIS — S88112A Complete traumatic amputation at level between knee and ankle, left lower leg, initial encounter: Secondary | ICD-10-CM

## 2024-05-12 DIAGNOSIS — Z89512 Acquired absence of left leg below knee: Secondary | ICD-10-CM | POA: Diagnosis not present

## 2024-05-13 ENCOUNTER — Other Ambulatory Visit: Payer: Self-pay

## 2024-05-13 DIAGNOSIS — Z89512 Acquired absence of left leg below knee: Secondary | ICD-10-CM | POA: Diagnosis not present

## 2024-05-15 ENCOUNTER — Other Ambulatory Visit: Payer: Self-pay | Admitting: Family Medicine

## 2024-05-15 MED ORDER — PANTOPRAZOLE SODIUM 40 MG PO TBEC
40.0000 mg | DELAYED_RELEASE_TABLET | Freq: Every day | ORAL | 0 refills | Status: DC
  Filled 2024-05-15: qty 90, 90d supply, fill #0

## 2024-05-16 ENCOUNTER — Other Ambulatory Visit: Payer: Self-pay

## 2024-05-16 ENCOUNTER — Other Ambulatory Visit: Payer: Self-pay | Admitting: Family Medicine

## 2024-05-16 DIAGNOSIS — N528 Other male erectile dysfunction: Secondary | ICD-10-CM

## 2024-05-16 DIAGNOSIS — Z89512 Acquired absence of left leg below knee: Secondary | ICD-10-CM | POA: Diagnosis not present

## 2024-05-16 MED ORDER — SILDENAFIL CITRATE 100 MG PO TABS
100.0000 mg | ORAL_TABLET | Freq: Every day | ORAL | 1 refills | Status: DC | PRN
  Filled 2024-05-16: qty 10, 30d supply, fill #0
  Filled 2024-06-06: qty 10, 30d supply, fill #1

## 2024-05-16 NOTE — Progress Notes (Deleted)
 Acute Office Visit  Subjective:     Patient ID: Taylor Hughes, male    DOB: Dec 12, 1970, 53 y.o.   MRN: [id number]  No chief complaint on file.   HPI Patient is in today for tingling right side of face  04/2024 Pcp visit [name] is a 53 year old male with a history of Type 2 diabetes mellitus (A1c of 9.8), L fifth toe ray amputation, hypertension, hyperlipidemia, Lymphedema, left BKA (in 02/2024) who presents for follow-up.   He underwent a left  BKA due to a severe infection that originated from a wound on the bottom of his foot. The infection worsened during a period of personal neglect while his significant other was hospitalized. His daughter intervened when the infection became critical.   He has diabetes with a recent A1c of 10.9%. He monitors his blood sugar three times daily, with morning readings between 90-100 mg/dL and occasional lows of 67 mg/dL. Daytime readings have not exceeded 200 mg/dL, with a recent high of 190 mg/dL. He is on Lantus  27 units daily and Humalog  0-12 units three times a day with meals on a sliding scale.   He experiences elevated blood pressure since hospital discharge, despite taking metoprolol  25 mg and valsartan  160mg  . He questions the efficacy of metoprolol , as his blood pressure was previously normal. He also takes iron supplements.   He and his significant other have aides assisting him at home. Expand All Collapse All    Subjective:  Patient ID: Taylor Hughes, male    DOB: 03-07-1971  Age: 53 y.o. MRN: [id number]   CC: Follow-up (Follow-up. /Discuss restarting Ozempic  /Discuss metoprolol  /No to flu vax)         Discussed the use of AI scribe software for clinical note transcription with the patient, who gave verbal consent to proceed.   History of Present Illness [name] is a 53 year old male with a history of Type 2 diabetes mellitus (A1c of 9.8), L fifth toe ray amputation, hypertension, hyperlipidemia, Lymphedema, left BKA (in  02/2024) who presents for follow-up.   He underwent a left  BKA due to a severe infection that originated from a wound on the bottom of his foot. The infection worsened during a period of personal neglect while his significant other was hospitalized. His daughter intervened when the infection became critical.   He has diabetes with a recent A1c of 10.9%. He monitors his blood sugar three times daily, with morning readings between 90-100 mg/dL and occasional lows of 67 mg/dL. Daytime readings have not exceeded 200 mg/dL, with a recent high of 190 mg/dL. He is on Lantus  27 units daily and Humalog  0-12 units three times a day with meals on a sliding scale.   He experiences elevated blood pressure since hospital discharge, despite taking metoprolol  25 mg and valsartan  160mg  . He questions the efficacy of metoprolol , as his blood pressure was previously normal. He also takes iron supplements.   He and his significant other have aides assisting him at home.           Past Medical History:  Diagnosis Date   Atrial fibrillation (HCC)     Diabetes mellitus without complication (HCC)      Type 2   GERD (gastroesophageal reflux disease)     Hyperlipidemia     Hypertension     Morbid (severe) obesity due to excess calories (HCC) 08/07/2023    bmi 44.63   Wears glasses     Wound, open,  foot      left diabetic                Past Surgical History:  Procedure Laterality Date   AMPUTATION Left 08/20/2018    Procedure: LEFT FOOT IRRIGATON AND DEBRIDEMENT, 5TH RAY AMPUTATION;  Surgeon: Harden Jerona GAILS, MD;  Location: [facility] OR;  Service: Orthopedics;  Laterality: Left;   AMPUTATION Right 03/25/2023    Procedure: RIGHT TRANSMETATARSAL AMPUTATION;  Surgeon: Harden Jerona GAILS, MD;  Location: [facility] OR;  Service: Orthopedics;  Laterality: Right;   AMPUTATION Left 02/26/2024    Procedure: AMPUTATION BELOW KNEE, LEFT;  Surgeon: Harden Jerona GAILS, MD;  Location: [facility] OR;  Service: Orthopedics;  Laterality: Left;  WOUND VAC  APPLIED   APPLICATION OF A-CELL OF CHEST/ABDOMEN N/A 05/30/2019    Procedure: Application Of A-Cell Of Chest;  Surgeon: Shyrl Linnie KIDD, MD;  Location: [facility] OR;  Service: Cardiothoracic;  Laterality: N/A;   APPLICATION OF WOUND VAC N/A 05/30/2019    Procedure: Application Of Wound Vac;  Surgeon: Shyrl Linnie KIDD, MD;  Location: [facility] OR;  Service: Cardiothoracic;  Laterality: N/A;   APPLICATION OF WOUND VAC Left 02/26/2024    Procedure: APPLICATION, WOUND VAC;  Surgeon: Harden Jerona GAILS, MD;  Location: [facility] OR;  Service: Orthopedics;  Laterality: Left;   ESOPHAGOGASTRODUODENOSCOPY N/A 02/25/2024    Procedure: EGD (ESOPHAGOGASTRODUODENOSCOPY);  Surgeon: Suzann Inocente HERO, MD;  Location: [facility] END[facility]COPY;  Service: Gastroenterology;  Laterality: N/A;   FINGER SURGERY Right 2016    I&D  small finger   I & D EXTREMITY Left 07/30/2018    Procedure: IRRIGATION AND DEBRIDEMENT LEFT FOOT WITH POSSIBLE AMPUTATION OF FIFTH TOE;  Surgeon: Vernetta Lonni GRADE, MD;  Location: [facility];  Service: Orthopedics;  Laterality: Left;   INCISION AND DRAINAGE ABSCESS Left 01/19/2019    Procedure: INCISION AND DRAINAGE ABSCESS upper chest;  Surgeon: Carlie Clark, MD;  Location: [facility] OR;  Service: ENT;  Laterality: Left;   IRRIGATION AND DEBRIDEMENT STERNOCLAVICULAR JOINT-STERNUM AND RIBS N/A 05/30/2019    Procedure: IRRIGATION AND DEBRIDEMENT OF STERNOCLAVICULAR JOINT-STERNUM AND RIBS ;  Surgeon: Shyrl Linnie KIDD, MD;  Location: [facility] OR;  Service: Cardiothoracic;  Laterality: N/A;   MINOR IRRIGATION AND DEBRIDEMENT OF WOUND N/A 11/22/2018    Procedure: INCISION AND DRAINAGE OF NECK ABSCESS;  Surgeon: Carlie Clark, MD;  Location: [facility];  Service: ENT;  Laterality: N/A;               Family History  Problem Relation Age of Onset   Diabetes Mother     Colon cancer Brother     Esophageal cancer Neg Hx     Rectal cancer Neg Hx     Stomach cancer Neg Hx            Social History         Socioeconomic History    Marital status: Divorced      Spouse name: Not on file   Number of children: Not on file   Years of education: Not on file   Highest education level: 11th grade  Occupational History   Not on file  Tobacco Use   Smoking status: Never   Smokeless tobacco: Never  Vaping Use   Vaping status: Never Used  Substance and Sexual Activity   Alcohol use: No      Alcohol/week: 0.0 standard drinks of alcohol   Drug use: No   Sexual activity: Not on file  Other Topics Concern   Not on file  Social History Narrative   Not on file    Social Drivers of Health        Financial Resource Strain: Medium Risk (06/24/2023)    Overall Financial Resource Strain (CARDIA)     Difficulty of Paying Living Expenses: Somewhat hard  Food Insecurity: Food Insecurity Present (02/24/2024)    Hunger Vital Sign     Worried About Running Out of Food in the Last Year: Sometimes true     Ran Out of Food in the Last Year: Sometimes true  Transportation Needs: No Transportation Needs (02/24/2024)    PRAPARE - Therapist, art (Medical): No     Lack of Transportation (Non-Medical): No  Physical Activity: Unknown (06/24/2023)    Exercise Vital Sign     Days of Exercise per Week: 0 days     Minutes of Exercise per Session: Not on file  Stress: No Stress Concern Present (06/24/2023)    [employer] of Occupational Health - Occupational Stress Questionnaire     Feeling of Stress : Only a little  Social Connections: Socially Isolated (06/24/2023)    Social Connection and Isolation Panel     Frequency of Communication with Friends and Family: Twice a week     Frequency of Social Gatherings with Friends and Family: Once a week     Attends Religious Services: Never     Database administrator or Organizations: No     Attends Engineer, structural: Not on file     Marital Status: Divorced      Allergies  No Known Allergies           Outpatient Medications Prior to Visit   Medication Sig Dispense Refill   Accu-Chek Softclix Lancets lancets Use to check blood sugar 3 times daily. 100 each 6   acetaminophen  (TYLENOL ) 500 MG tablet Take 2 tablets (1,000 mg total) by mouth every 8 (eight) hours.       acidophilus (RISAQUAD) CAPS capsule Take 2 capsules by mouth 3 (three) times daily.       Continuous Glucose Receiver (DEXCOM G7 RECEIVER) DEVI Use to test blood sugar 1 each 0   Continuous Glucose Sensor (DEXCOM G7 SENSOR) MISC Use to test blood sugar 3 each 6   glucose blood (ACCU-CHEK GUIDE TEST) test strip use as directed 3 (three) times daily. 100 strip 5   insulin  glargine (LANTUS  SOLOSTAR) 100 UNIT/ML Solostar Pen Inject 27 Units into the skin daily. 30 mL 1   insulin  lispro (HUMALOG  KWIKPEN) 100 UNIT/ML KwikPen Inject 0-12 units into the skin three times daily with meals. Per sliding scale. 12 mL 3   Multiple Vitamin (MULTIVITAMIN WITH MINERALS) TABS tablet Take 1 tablet by mouth daily.       oxyCODONE  (ROXICODONE ) 15 MG immediate release tablet Take 1 tablet (15 mg total) by mouth every 4 (four) hours as needed for severe pain (pain score 7-10). 30 tablet 0   oxyCODONE -acetaminophen  (PERCOCET/ROXICET) 5-325 MG tablet Take 1 tablet by mouth every 6 (six) hours as needed. 30 tablet 0   oxyCODONE -acetaminophen  (PERCOCET/ROXICET) 5-325 MG tablet Take 1 tablet by mouth every 6 (six) hours as needed. 19 tablet 0   pantoprazole  (PROTONIX ) 40 MG tablet Take 1 tablet (40 mg total) by mouth daily. 30 tablet 0   simethicone  (MYLICON) 80 MG chewable tablet Chew 1 tablet (80 mg total) by mouth 4 (four) times daily as needed for flatulence. 30 tablet 0   atorvastatin  (LIPITOR) 20 MG  tablet Take 1 tablet (20 mg total) by mouth at bedtime. 30 tablet 0   ferrous sulfate  325 (65 FE) MG tablet Take 1 tablet (325 mg total) by mouth daily with breakfast. 30 tablet 0   gabapentin  (NEURONTIN ) 300 MG capsule Take 1 capsule (300 mg total) by mouth 3 (three) times daily. 90 capsule 0    metoprolol  tartrate (LOPRESSOR ) 50 MG tablet Take 1 tablet (50 mg total) by mouth 2 (two) times daily. 60 tablet 0   sildenafil  (VIAGRA ) 100 MG tablet Take 1 tablet (100 mg total) by mouth daily as needed for erectile dysfunction. At least 24 hours between doses 10 tablet 0   valsartan  (DIOVAN ) 160 MG tablet Take 1 tablet (160 mg total) by mouth daily. 30 tablet 0      No facility-administered medications prior to visit.          ROS Review of Systems  Constitutional:  Negative for activity change and appetite change.  HENT:  Negative for sinus pressure and sore throat.   Respiratory:  Negative for chest tightness, shortness of breath and wheezing.   Cardiovascular:  Positive for leg swelling. Negative for chest pain and palpitations.  Gastrointestinal:  Negative for abdominal distention, abdominal pain and constipation.  Genitourinary: Negative.   Musculoskeletal: Negative.   Psychiatric/Behavioral:  Negative for behavioral problems and dysphoric mood.      Objective:  BP (!) 162/97   Pulse 86   Temp 97.7 F (36.5 C) (Oral)   SpO2 100%        04/27/2024    9:37 AM 04/27/2024    9:09 AM 04/14/2024   10:30 AM  BP/Weight  Systolic BP 162 154 151  Diastolic BP 97 96 90          Physical Exam Constitutional:      Appearance: He is well-developed.  Cardiovascular:     Rate and Rhythm: Normal rate.     Heart sounds: Normal heart sounds. No murmur heard. Pulmonary:     Effort: Pulmonary effort is normal.     Breath sounds: Normal breath sounds. No wheezing or rales.  Chest:     Chest wall: No tenderness.  Abdominal:     General: Bowel sounds are normal. There is no distension.     Palpations: Abdomen is soft. There is no mass.     Tenderness: There is no abdominal tenderness.  Musculoskeletal:     Right lower leg: Edema present.     Comments: Left BKA  Neurological:     Mental Status: He is alert and oriented to person, place, and time.  Psychiatric:        Mood and  Affect: Mood normal.           Latest Ref Rng & Units 03/03/2024    5:03 AM 03/02/2024    8:52 AM 02/29/2024    6:50 AM  CMP  Glucose 70 - 99 mg/dL 745  665  750   BUN 6 - 20 mg/dL 28  28  26    Creatinine 0.61 - 1.24 mg/dL 9.08  9.03  9.08   Sodium 135 - 145 mmol/L 135  133  137   Potassium 3.5 - 5.1 mmol/L 4.4  4.3  3.6   Chloride 98 - 111 mmol/L 106  106  105   CO2 22 - 32 mmol/L 20  17  22    Calcium  8.9 - 10.3 mg/dL 8.0  8.0  8.1       Lipid Panel  Labs (Brief)          Component Value Date/Time    CHOL 153 11/11/2023 0319    CHOL 173 05/09/2022 1359    TRIG 61 11/11/2023 0319    HDL 60 11/11/2023 0319    HDL 71 05/09/2022 1359    CHOLHDL 2.6 11/11/2023 0319    VLDL 12 11/11/2023 0319    LDLCALC 81 11/11/2023 0319    LDLCALC 92 05/09/2022 1359        CBC Labs (Brief)          Component Value Date/Time    WBC 8.2 03/02/2024 0852    RBC 3.44 (L) 03/02/2024 0852    HGB 9.3 (L) 03/02/2024 0852    HGB 11.1 (L) 06/25/2023 1537    HCT 30.1 (L) 03/02/2024 0852    HCT 35.4 (L) 06/25/2023 1537    PLT 347 03/02/2024 0852    PLT 302 06/25/2023 1537    MCV 87.5 03/02/2024 0852    MCV 92 06/25/2023 1537    MCH 27.0 03/02/2024 0852    MCHC 30.9 03/02/2024 0852    RDW 18.0 (H) 03/02/2024 0852    RDW 14.5 06/25/2023 1537    LYMPHSABS 0.7 02/21/2024 2130    LYMPHSABS 1.3 06/25/2023 1537    MONOABS 0.3 02/21/2024 2130    EOSABS 0.0 02/21/2024 2130    EOSABS 0.1 06/25/2023 1537    BASOSABS 0.1 02/21/2024 2130    BASOSABS 0.0 06/25/2023 1537        Recent Labs       Lab Results  Component Value Date    HGBA1C 10.9 (H) 02/22/2024             Assessment & Plan Type 2 diabetes mellitus with neuropathy Type 2 diabetes mellitus with recent A1c of 10.9%. Blood glucose levels below 200 mg/dL since discharge. Currently on insulin  glargine and insulin  lispro. Interested in starting Ozempic  for better glycemic control and potential weight loss. - Start Ozempic  0.25 mg  weekly. Send prescription to [facility]. - Monitor blood glucose levels closely. If blood glucose drops too low, reduce insulin  glargine from 27 units to 25 units, and further to 23 units if necessary. - Follow up in one month to assess blood glucose control and A1c levels. - Educated on the importance of consistent weekly dosing of Ozempic  and monitoring for hypoglycemia. - Next A1c is due on 05/24/2024   Hypertension associated with type 2 diabetes mellitus Essential hypertension with elevated blood pressure during the visit. Currently on metoprolol  and valsartan . Reports increased blood pressure since hospital discharge and questions metoprolol  efficacy. -Repeat blood pressure still elevated - Switch metoprolol  to carvedilol . - Continue valsartan . - Advise on a low sodium diet. - Recheck blood pressure in one month.   Left BKA Status post lower extremity amputation  Managing well with home health aide support. - No immediate orthopedic follow-up required unless issues arise.     Erectile dysfunction - Viagra  refilled   On statin therapy due to increased risk of cardiovascular disease - Continue statin     ROS      Objective:    There were no vitals taken for this visit. {Vitals History (Optional):23777}  Physical Exam  No results found for any visits on 05/19/24.      Assessment & Plan:   Problem List Items Addressed This Visit   None   No orders of the defined types were placed in this encounter.   No follow-ups on file.  Belvie Silvan, MD

## 2024-05-17 DIAGNOSIS — Z89512 Acquired absence of left leg below knee: Secondary | ICD-10-CM | POA: Diagnosis not present

## 2024-05-18 DIAGNOSIS — Z89512 Acquired absence of left leg below knee: Secondary | ICD-10-CM | POA: Diagnosis not present

## 2024-05-19 ENCOUNTER — Ambulatory Visit: Admitting: Critical Care Medicine

## 2024-05-19 DIAGNOSIS — Z89512 Acquired absence of left leg below knee: Secondary | ICD-10-CM | POA: Diagnosis not present

## 2024-05-20 ENCOUNTER — Other Ambulatory Visit: Payer: Self-pay | Admitting: Family

## 2024-05-20 ENCOUNTER — Other Ambulatory Visit: Payer: Self-pay

## 2024-05-20 ENCOUNTER — Telehealth: Payer: Self-pay | Admitting: Family

## 2024-05-20 DIAGNOSIS — S88112A Complete traumatic amputation at level between knee and ankle, left lower leg, initial encounter: Secondary | ICD-10-CM

## 2024-05-20 DIAGNOSIS — Z89512 Acquired absence of left leg below knee: Secondary | ICD-10-CM | POA: Diagnosis not present

## 2024-05-20 NOTE — Telephone Encounter (Signed)
 Pt called requesting a refill of medication. Please send to [facility] . Pt phone number is [phone].

## 2024-05-20 NOTE — Telephone Encounter (Signed)
 Patient is s/p a left BKA 02/26/2024 and is requesting Oxycodone  5/325 reill. Last refill was 05/11/2024 #30 please advise.

## 2024-05-23 ENCOUNTER — Telehealth: Payer: Self-pay | Admitting: Family

## 2024-05-23 ENCOUNTER — Other Ambulatory Visit: Payer: Self-pay

## 2024-05-23 NOTE — Telephone Encounter (Signed)
 Pt called back wanting to know the status of his refill. Pt cal back number is [phone]

## 2024-05-23 NOTE — Telephone Encounter (Signed)
 Several messages sent to NP to advise. Will sign off on this message and await advisement upon return to office Tuesday.

## 2024-05-24 ENCOUNTER — Other Ambulatory Visit: Payer: Self-pay | Admitting: Family

## 2024-05-24 ENCOUNTER — Other Ambulatory Visit: Payer: Self-pay

## 2024-05-24 DIAGNOSIS — S88112A Complete traumatic amputation at level between knee and ankle, left lower leg, initial encounter: Secondary | ICD-10-CM

## 2024-05-24 MED ORDER — OXYCODONE-ACETAMINOPHEN 5-325 MG PO TABS
1.0000 | ORAL_TABLET | Freq: Two times a day (BID) | ORAL | 0 refills | Status: DC | PRN
  Filled 2024-05-24: qty 14, 7d supply, fill #0

## 2024-05-31 ENCOUNTER — Encounter: Payer: Self-pay | Admitting: Family Medicine

## 2024-05-31 ENCOUNTER — Other Ambulatory Visit: Payer: Self-pay

## 2024-05-31 ENCOUNTER — Ambulatory Visit: Attending: Family Medicine | Admitting: Family Medicine

## 2024-05-31 VITALS — BP 160/100 | HR 75 | Temp 97.8°F | Ht 72.0 in

## 2024-05-31 DIAGNOSIS — Z89512 Acquired absence of left leg below knee: Secondary | ICD-10-CM

## 2024-05-31 DIAGNOSIS — Z794 Long term (current) use of insulin: Secondary | ICD-10-CM

## 2024-05-31 DIAGNOSIS — R202 Paresthesia of skin: Secondary | ICD-10-CM

## 2024-05-31 DIAGNOSIS — E1159 Type 2 diabetes mellitus with other circulatory complications: Secondary | ICD-10-CM

## 2024-05-31 DIAGNOSIS — E1142 Type 2 diabetes mellitus with diabetic polyneuropathy: Secondary | ICD-10-CM | POA: Diagnosis not present

## 2024-05-31 DIAGNOSIS — I152 Hypertension secondary to endocrine disorders: Secondary | ICD-10-CM

## 2024-05-31 DIAGNOSIS — R52 Pain, unspecified: Secondary | ICD-10-CM

## 2024-05-31 DIAGNOSIS — G548 Other nerve root and plexus disorders: Secondary | ICD-10-CM | POA: Insufficient documentation

## 2024-05-31 LAB — POCT GLYCOSYLATED HEMOGLOBIN (HGB A1C): HbA1c, POC (controlled diabetic range): 6.6 % (ref 0.0–7.0)

## 2024-05-31 MED ORDER — GABAPENTIN 300 MG PO CAPS
600.0000 mg | ORAL_CAPSULE | Freq: Three times a day (TID) | ORAL | 1 refills | Status: AC
  Filled 2024-05-31 – 2024-08-12 (×5): qty 540, 90d supply, fill #0

## 2024-05-31 MED ORDER — OZEMPIC (0.25 OR 0.5 MG/DOSE) 2 MG/3ML ~~LOC~~ SOPN
0.2500 mg | PEN_INJECTOR | SUBCUTANEOUS | 1 refills | Status: AC
  Filled 2024-05-31: qty 3, 42d supply, fill #0
  Filled 2024-07-19: qty 3, 28d supply, fill #1

## 2024-05-31 MED ORDER — CARVEDILOL 6.25 MG PO TABS
6.2500 mg | ORAL_TABLET | Freq: Two times a day (BID) | ORAL | 1 refills | Status: AC
  Filled 2024-05-31: qty 180, 90d supply, fill #0
  Filled 2024-07-19: qty 180, 90d supply, fill #1

## 2024-05-31 NOTE — Progress Notes (Signed)
 Subjective:  Patient ID: Taylor Hughes, male    DOB: 25-Nov-1970  Age: 53 y.o. MRN: [id number]  CC: Medical Management of Chronic Issues (Referral for PT/Right side of face jumping)     Discussed the use of AI scribe software for clinical note transcription with the patient, who gave verbal consent to proceed.  History of Present Illness Taylor Hughes is a 53 year old male with a history of Type 2 diabetes mellitus (A1c of 9.8), L fifth toe ray amputation, hypertension, hyperlipidemia, Lymphedema, left BKA (in 02/2024) diabetes and hypertension who presents for follow-up of his diabetes.  He has not received his prescribed Ozempic  and has not been contacted by the pharmacy.  This visit was scheduled to assist him in uptitrating his dose of Ozempic  while decreasing his Lantus  dose.  He endorses adherence with Lantus  26 units in addition to Humalog  sliding scale.  A1c is 6.6 down from 10.9  previously.  His blood pressure is 184/94 today despite taking his medication this morning. He had previous elevated readings and a recent change from metoprolol  to carvedilol  at his last office visit.  He experiences a sensation on the right side of his face, near his nostril, described as a 'shock' when touched or when water  runs over it. This began a few months ago, following dental work done six to eight months prior.  He experiences phantom pain in his amputated leg on the left lower extremity, with a burning sensation in the absent foot. He takes gabapentin  300 mg three times a day, which aids relaxation at night, and requests an increased dosage for better pain management. He has neuropathy in his foot, with numbness and tingling, which he considers normal.  He recently received a prescription for left leg prosthesis and requests referral to physical therapy to assist with balance and walking and seeks a referral to initiate these sessions.    Past Medical History:  Diagnosis Date   Atrial  fibrillation (HCC)    Diabetes mellitus without complication (HCC)    Type 2   GERD (gastroesophageal reflux disease)    Hyperlipidemia    Hypertension    Morbid (severe) obesity due to excess calories (HCC) 08/07/2023   bmi 44.63   Wears glasses    Wound, open, foot    left diabetic     Past Surgical History:  Procedure Laterality Date   AMPUTATION Left 08/20/2018   Procedure: LEFT FOOT IRRIGATON AND DEBRIDEMENT, 5TH RAY AMPUTATION;  Surgeon: Harden Jerona GAILS, MD;  Location: [facility] OR;  Service: Orthopedics;  Laterality: Left;   AMPUTATION Right 03/25/2023   Procedure: RIGHT TRANSMETATARSAL AMPUTATION;  Surgeon: Harden Jerona GAILS, MD;  Location: [facility] OR;  Service: Orthopedics;  Laterality: Right;   AMPUTATION Left 02/26/2024   Procedure: AMPUTATION BELOW KNEE, LEFT;  Surgeon: Harden Jerona GAILS, MD;  Location: [facility] OR;  Service: Orthopedics;  Laterality: Left;  WOUND VAC APPLIED   APPLICATION OF A-CELL OF CHEST/ABDOMEN N/A 05/30/2019   Procedure: Application Of A-Cell Of Chest;  Surgeon: Shyrl Linnie KIDD, MD;  Location: [facility] OR;  Service: Cardiothoracic;  Laterality: N/A;   APPLICATION OF WOUND VAC N/A 05/30/2019   Procedure: Application Of Wound Vac;  Surgeon: Shyrl Linnie KIDD, MD;  Location: [facility] OR;  Service: Cardiothoracic;  Laterality: N/A;   APPLICATION OF WOUND VAC Left 02/26/2024   Procedure: APPLICATION, WOUND VAC;  Surgeon: Harden Jerona GAILS, MD;  Location: [facility] OR;  Service: Orthopedics;  Laterality: Left;   ESOPHAGOGASTRODUODENOSCOPY N/A 02/25/2024   Procedure:  EGD (ESOPHAGOGASTRODUODENOSCOPY);  Surgeon: Suzann Inocente HERO, MD;  Location: [facility] ENDOSCOPY;  Service: Gastroenterology;  Laterality: N/A;   FINGER SURGERY Right 2016   I&D  small finger   I & D EXTREMITY Left 07/30/2018   Procedure: IRRIGATION AND DEBRIDEMENT LEFT FOOT WITH POSSIBLE AMPUTATION OF FIFTH TOE;  Surgeon: Vernetta Lonni GRADE, MD;  Location: [facility] ORS;  Service: Orthopedics;  Laterality: Left;   INCISION AND DRAINAGE ABSCESS  Left 01/19/2019   Procedure: INCISION AND DRAINAGE ABSCESS upper chest;  Surgeon: Carlie Clark, MD;  Location: [facility] OR;  Service: ENT;  Laterality: Left;   IRRIGATION AND DEBRIDEMENT STERNOCLAVICULAR JOINT-STERNUM AND RIBS N/A 05/30/2019   Procedure: IRRIGATION AND DEBRIDEMENT OF STERNOCLAVICULAR JOINT-STERNUM AND RIBS ;  Surgeon: Shyrl Linnie KIDD, MD;  Location: [facility] OR;  Service: Cardiothoracic;  Laterality: N/A;   MINOR IRRIGATION AND DEBRIDEMENT OF WOUND N/A 11/22/2018   Procedure: INCISION AND DRAINAGE OF NECK ABSCESS;  Surgeon: Carlie Clark, MD;  Location: [facility] ORS;  Service: ENT;  Laterality: N/A;    Family History  Problem Relation Age of Onset   Diabetes Mother    Colon cancer Brother    Esophageal cancer Neg Hx    Rectal cancer Neg Hx    Stomach cancer Neg Hx     Social History   Socioeconomic History   Marital status: Divorced    Spouse name: Not on file   Number of children: Not on file   Years of education: Not on file   Highest education level: 11th grade  Occupational History   Not on file  Tobacco Use   Smoking status: Never   Smokeless tobacco: Never  Vaping Use   Vaping status: Never Used  Substance and Sexual Activity   Alcohol use: No    Alcohol/week: 0.0 standard drinks of alcohol   Drug use: No   Sexual activity: Not on file  Other Topics Concern   Not on file  Social History Narrative   Not on file   Social Drivers of Health   Financial Resource Strain: High Risk (05/15/2024)   Overall Financial Resource Strain (CARDIA)    Difficulty of Paying Living Expenses: Hard  Food Insecurity: Food Insecurity Present (05/15/2024)   Hunger Vital Sign    Worried About Radiation protection practitioner of Food in the Last Year: Often true    Ran Out of Food in the Last Year: Sometimes true  Transportation Needs: No Transportation Needs (05/15/2024)   PRAPARE - Administrator, Civil Service (Medical): No    Lack of Transportation (Non-Medical): No  Physical Activity:  Insufficiently Active (05/15/2024)   Exercise Vital Sign    Days of Exercise per Week: 2 days    Minutes of Exercise per Session: 30 min  Stress: No Stress Concern Present (05/15/2024)   [employer] of Occupational Health - Occupational Stress Questionnaire    Feeling of Stress: Not at all  Social Connections: Socially Isolated (05/15/2024)   Social Connection and Isolation Panel    Frequency of Communication with Friends and Family: Once a week    Frequency of Social Gatherings with Friends and Family: Once a week    Attends Religious Services: Never    Database administrator or Organizations: No    Attends Engineer, structural: Not on file    Marital Status: Divorced    No Known Allergies  Outpatient Medications Prior to Visit  Medication Sig Dispense Refill   Accu-Chek Softclix Lancets lancets Use to check blood sugar  3 times daily. 100 each 6   acetaminophen  (TYLENOL ) 500 MG tablet Take 2 tablets (1,000 mg total) by mouth every 8 (eight) hours.     acidophilus (RISAQUAD) CAPS capsule Take 2 capsules by mouth 3 (three) times daily.     atorvastatin  (LIPITOR) 20 MG tablet Take 1 tablet (20 mg total) by mouth at bedtime. 90 tablet 1   Continuous Glucose Receiver (DEXCOM G7 RECEIVER) DEVI Use to test blood sugar 1 each 0   Continuous Glucose Sensor (DEXCOM G7 SENSOR) MISC Use to test blood sugar 3 each 6   ferrous sulfate  325 (65 FE) MG tablet Take 1 tablet (325 mg total) by mouth daily with breakfast. 30 tablet 0   glucose blood (ACCU-CHEK GUIDE TEST) test strip use as directed 3 (three) times daily. 100 strip 5   insulin  glargine (LANTUS  SOLOSTAR) 100 UNIT/ML Solostar Pen Inject 27 Units into the skin daily. 30 mL 1   insulin  lispro (HUMALOG  KWIKPEN) 100 UNIT/ML KwikPen Inject 0-12 units into the skin three times daily with meals. Per sliding scale. 12 mL 3   Multiple Vitamin (MULTIVITAMIN WITH MINERALS) TABS tablet Take 1 tablet by mouth daily.      oxyCODONE -acetaminophen  (PERCOCET/ROXICET) 5-325 MG tablet Take 1 tablet by mouth every 12 (twelve) hours as needed. 14 tablet 0   pantoprazole  (PROTONIX ) 40 MG tablet Take 1 tablet (40 mg total) by mouth daily. 90 tablet 0   sildenafil  (VIAGRA ) 100 MG tablet Take 1 tablet (100 mg total) by mouth daily as needed for erectile dysfunction. At least 24 hours between doses 10 tablet 1   simethicone  (MYLICON) 80 MG chewable tablet Chew 1 tablet (80 mg total) by mouth 4 (four) times daily as needed for flatulence. 30 tablet 0   valsartan  (DIOVAN ) 160 MG tablet Take 1 tablet (160 mg total) by mouth daily. 90 tablet 1   carvedilol  (COREG ) 3.125 MG tablet Take 1 tablet (3.125 mg total) by mouth 2 (two) times daily with a meal. 180 tablet 1   gabapentin  (NEURONTIN ) 300 MG capsule Take 1 capsule (300 mg total) by mouth 3 (three) times daily. 270 capsule 1   Semaglutide ,0.25 or 0.5MG /DOS, (OZEMPIC , 0.25 OR 0.5 MG/DOSE,) 2 MG/1.5ML SOPN Inject 0.25 mg into the skin once a week. For 4 weeks then increase to 0.5mg  2 mL 1   No facility-administered medications prior to visit.     ROS Review of Systems  Constitutional:  Negative for activity change and appetite change.  HENT:  Negative for sinus pressure and sore throat.   Respiratory:  Negative for chest tightness, shortness of breath and wheezing.   Cardiovascular:  Negative for chest pain and palpitations.  Gastrointestinal:  Negative for abdominal distention, abdominal pain and constipation.  Genitourinary: Negative.   Musculoskeletal: Negative.   Psychiatric/Behavioral:  Negative for behavioral problems and dysphoric mood.     Objective:  BP (!) 160/100   Pulse 75   Temp 97.8 F (36.6 C) (Oral)   Ht 6' (1.829 m)   SpO2 99%   BMI 42.55 kg/m      05/31/2024    9:16 AM 05/31/2024    8:48 AM 04/27/2024    9:37 AM  BP/Weight  Systolic BP 160 184 162  Diastolic BP 100 94 97  Wt. (Lbs)  --       Physical Exam Constitutional:       Appearance: He is well-developed.  Cardiovascular:     Rate and Rhythm: Normal rate.     Heart  sounds: Normal heart sounds. No murmur heard. Pulmonary:     Effort: Pulmonary effort is normal.     Breath sounds: Normal breath sounds. No wheezing or rales.  Chest:     Chest wall: No tenderness.  Abdominal:     General: Bowel sounds are normal. There is no distension.     Palpations: Abdomen is soft. There is no mass.     Tenderness: There is no abdominal tenderness.  Musculoskeletal:     Right lower leg: Edema present.     Left lower leg: Edema present.     Comments: Left BKA  Neurological:     Mental Status: He is alert and oriented to person, place, and time.     Cranial Nerves: No cranial nerve deficit.  Psychiatric:        Mood and Affect: Mood normal.    Diabetic Foot Exam - Simple   Simple Foot Form Diabetic Foot exam was performed with the following findings: Yes 05/31/2024  9:09 AM  Visual Inspection See comments: Yes Sensation Testing Intact to touch and monofilament testing bilaterally: Yes Pulse Check Posterior Tibialis and Dorsalis pulse intact bilaterally: Yes Comments Left BKA Right transmetatarsal amputation Lymphedema No calluses, no ulcers.        Latest Ref Rng & Units 03/03/2024    5:03 AM 03/02/2024    8:52 AM 02/29/2024    6:50 AM  CMP  Glucose 70 - 99 mg/dL 745  665  750   BUN 6 - 20 mg/dL 28  28  26    Creatinine 0.61 - 1.24 mg/dL 9.08  9.03  9.08   Sodium 135 - 145 mmol/L 135  133  137   Potassium 3.5 - 5.1 mmol/L 4.4  4.3  3.6   Chloride 98 - 111 mmol/L 106  106  105   CO2 22 - 32 mmol/L 20  17  22    Calcium  8.9 - 10.3 mg/dL 8.0  8.0  8.1     Lipid Panel     Component Value Date/Time   CHOL 153 11/11/2023 0319   CHOL 173 05/09/2022 1359   TRIG 61 11/11/2023 0319   HDL 60 11/11/2023 0319   HDL 71 05/09/2022 1359   CHOLHDL 2.6 11/11/2023 0319   VLDL 12 11/11/2023 0319   LDLCALC 81 11/11/2023 0319   LDLCALC 92 05/09/2022 1359     CBC    Component Value Date/Time   WBC 8.2 03/02/2024 0852   RBC 3.44 (L) 03/02/2024 0852   HGB 9.3 (L) 03/02/2024 0852   HGB 11.1 (L) 06/25/2023 1537   HCT 30.1 (L) 03/02/2024 0852   HCT 35.4 (L) 06/25/2023 1537   PLT 347 03/02/2024 0852   PLT 302 06/25/2023 1537   MCV 87.5 03/02/2024 0852   MCV 92 06/25/2023 1537   MCH 27.0 03/02/2024 0852   MCHC 30.9 03/02/2024 0852   RDW 18.0 (H) 03/02/2024 0852   RDW 14.5 06/25/2023 1537   LYMPHSABS 0.7 02/21/2024 2130   LYMPHSABS 1.3 06/25/2023 1537   MONOABS 0.3 02/21/2024 2130   EOSABS 0.0 02/21/2024 2130   EOSABS 0.1 06/25/2023 1537   BASOSABS 0.1 02/21/2024 2130   BASOSABS 0.0 06/25/2023 1537    Lab Results  Component Value Date   HGBA1C 6.6 05/31/2024    Lab Results  Component Value Date   HGBA1C 6.6 05/31/2024   HGBA1C 10.9 (H) 02/22/2024   HGBA1C 9.1 (H) 11/11/2023       Assessment & Plan Type 2 diabetes mellitus  with diabetic polyneuropathy A1c improved to 6.6, indicating good glycemic control. Neuropathy symptoms persist. Ozempic  used for weight management. - Send Ozempic  prescription to pharmacy. - Instruct to reduce insulin  glargine by 2 units with each increase in Ozempic  dose to prevent hypoglycemia. - Increase gabapentin  dose to manage neuropathy symptoms. - Check insurance coverage for Mounjaro and switch if Ozempic  fails as he expresses a desire for [facility] due to additional weight loss benefits -Counseled on Diabetic diet, the healthy plate, 849 minutes of moderate intensity exercise/week Blood sugar logs with fasting goals of 80-120 mg/dl, random of less than 819 and in the event of sugars less than 60 mg/dl or greater than 599 mg/dl encouraged to notify the clinic. Advised on the need for annual eye exams, annual foot exams, Pneumonia vaccine.   Hypertension associated with type 2 diabetes mellitus Blood pressure elevated at 184/94 despite medication adherence. Target is less than 130/80. - Double  the dose of carvedilol  to 6.25 mg. - Recheck blood pressure before the end of the visit -still elevated -Counseled on blood pressure goal of less than 130/80, low-sodium, DASH diet, medication compliance, 150 minutes of moderate intensity exercise per week. Discussed medication compliance, adverse effects.   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 - Increase gabapentin  dose to manage phantom limb pain. - Consider adding duloxetine if pain persists despite increased gabapentin .  Left below-knee amputation Adjusting to the amputation and requires physical therapy to improve balance and mobility with the prosthesis. - Provide referral to physical therapy for balance and mobility training.  Paresthesia Reports abnormal sensation on the right side of the face, possibly related to previous dental work. Differential includes nerve block effects or neuropathy, but the exact cause is unclear. - Monitor for any signs of stroke or worsening symptoms which are absent at the moment.      Meds ordered this encounter  Medications   gabapentin  (NEURONTIN ) 300 MG capsule    Sig: Take 2 capsules (600 mg total) by mouth 3 (three) times daily.    Dispense:  540 capsule    Refill:  1    Dose increase   Semaglutide ,0.25 or 0.5MG /DOS, (OZEMPIC , 0.25 OR 0.5 MG/DOSE,) 2 MG/3ML SOPN    Sig: Inject 0.25 mg into the skin once a week. For 4 weeks then increase to 0.5mg     Dispense:  3 mL    Refill:  1   carvedilol  (COREG ) 6.25 MG tablet    Sig: Take 1 tablet (6.25 mg total) by mouth 2 (two) times daily with a meal.    Dispense:  180 tablet    Refill:  1    Dose increase    Follow-up: Return in about 3 months (around 08/31/2024).       Corrina Sabin, MD, FAAFP. [facility] [place], [place] [phone]   05/31/2024, 11:32 AM

## 2024-05-31 NOTE — Patient Instructions (Signed)
 Phantom Limb Pain Phantom limb pain is pain in a body part that no longer exists. It usually happens in an arm or leg after it has been surgically removed (amputated). Most cases of phantom limb pain are brief. However, it can last for years, and it may be severe and disabling. The exact mechanism of how phantom limb pain occurs is not known. The problem may start in a part of the brain that processes feelings and awareness (sensations) from the rest of the body (sensory cortex). When a body part is lost, the sensory cortex may not be able to handle the loss and may reorganize (rewire) itself to make up for the lost signals. What are the causes? This condition only happens in patients with amputations, but the cause is not known. It may be caused by: Damaged nerve endings. Scar tissue. Rewiring of nerves in the brain or spine. What are the signs or symptoms? Symptoms vary and are related to the lost limb or the remaining stump. Stump pain may be mistaken for phantom limb pain, or you may have both at the same time. Symptoms include: Phantom pain. Pain often feels like the pain you had before the amputation. It usually comes and goes, and it gets better over time. The pain may feel like: Burning. Stabbing. Throbbing. Cramping. Prickling. Crushing. Telescoping. This is when the pain moves over time from the farthest part of the amputated limb (fingers or toes) up to the site of amputation, as if the limb is shrinking. Phantom sensation. This is a feeling other than pain, as if the limb is still part of the body. Physical or emotional factors can trigger or worsen pain sensations. Those factors may include: Weather changes. Stress. Strong emotions. Certain positions or movements of the body. Pressure on the affected area. How is this diagnosed? Diagnosis is based on your history of amputation and symptoms that you have after surgery. Your health care provider may: Do a physical exam. Talk  with you about your symptoms and past history of pain. You may have imaging tests to examine your stump, such as X-rays or CT scan. How is this treated? There are different therapies and medicines that may give you relief. Treatment options may include: Pain medicine. Medicine can be given for pain right after surgery (acute pain) and for pain that goes on for some time (chronic pain). Commonly used medicines include: Antidepressant medicine. Anticonvulsant medicine. Narcotics, analgesics, or anti-inflammatory medicine. Nerve blocks. Techniques that help to retrain the brain and nervous system (movement representation techniques), such as: Looking at your unaffected limb in a mirror and thinking about painless movement of your extremity (mirror therapy). Thinking about moving your limbs without actual movement (motor therapy). Watching and sensing the movement of other people (action observation). Attaching a myoelectric sensor on the stump to detect the muscle potential and predict the motion the person wants to perform (virtual reality). Sensory discrimination training. For this treatment, painless stimulation is applied to different parts of your stump and you describe what you feel. This may help with nerve rewiring. Physical therapy involving the stump, which may include: Exercise. This may be physical movement, or it may involve applying sound waves (ultrasound) or tapping (percussion therapy) to the stump. These exercises may help to heal and retrain tissue and nerves. Massage. Stump massage creates new sensations, breaks up scar tissue, and prepares the stump for an artificial limb (prosthesis). Heat or cold treatment. This can improve blood flow and reduce inflammation. Applying painless electrical  pulses to the skin to prevent sensations of pain from reaching the brain (transcutaneous electrical nerve stimulation, TENS). Complementary therapies, such as: Acupuncture. Relaxation  techniques. These often involve hypnosis, guided imagery, deep breathing, and muscle relaxation exercises. Biofeedback. This involves using monitors that alert you to changes in your breathing, heart rate, skin temperature, or muscle activity, and using relaxation techniques to reverse those changes. Biofeedback tells you if the techniques you are using are effective. Follow these instructions at home:  Take over-the-counter and prescription medicines only as told by your health care provider. Ask your health care provider if the medicine prescribed to you requires you to avoid driving or using machinery. Do not use any products that contain nicotine or tobacco. These products include cigarettes, chewing tobacco, and vaping devices, such as e-cigarettes. If you need help quitting, ask your health care provider. Join a support group. Express your feelings and talk with someone you trust. Seek counseling or talk therapy with a mental health professional. This may be helpful if you are having trouble managing your emotions about the amputation. Keep all follow-up visits. This is important. Contact a health care provider if: You have a sore on your stump that does not get better with treatment. Your pain does not improve with medicine or treatment. Get help right away if: You have suicidal thoughts. If you ever feel like you may hurt yourself or others, or have thoughts about taking your own life, get help right away. Go to your nearest emergency department or: Call your local emergency services (911 in the [place] Call a suicide crisis helpline, such as the National Suicide Prevention Lifeline at 253-011-4444 or 988 in the [place] This is open 24 hours a day in the [place] If you're a Veteran: Call 988 and press 1. This is open 24 hours a day. Text the [employer] at 3463962383. Summary Phantom limb pain is pain in a body part that no longer exists. It happens in an arm or leg (extremity) after it  has been surgically removed (amputated). Medicines or techniques that help to retrain the brain and nervous system (movement representation techniques) may help to relieve symptoms. Physical therapy for phantom limb pain may involve exercise, massage, heat or cold therapy, or painless stimulation of the skin (transcutaneous electrical nerve stimulation, TENS). This information is not intended to replace advice given to you by your health care provider. Make sure you discuss any questions you have with your health care provider. Document Revised: 03/12/2023 Document Reviewed: 12/13/2020 Elsevier Patient Education  2025 [employer].

## 2024-06-01 ENCOUNTER — Ambulatory Visit: Admitting: Family

## 2024-06-02 ENCOUNTER — Other Ambulatory Visit: Payer: Self-pay

## 2024-06-02 ENCOUNTER — Other Ambulatory Visit: Payer: Self-pay | Admitting: Family

## 2024-06-02 DIAGNOSIS — S88112A Complete traumatic amputation at level between knee and ankle, left lower leg, initial encounter: Secondary | ICD-10-CM

## 2024-06-03 ENCOUNTER — Telehealth: Payer: Self-pay | Admitting: Family

## 2024-06-03 ENCOUNTER — Ambulatory Visit: Admitting: Family

## 2024-06-03 ENCOUNTER — Other Ambulatory Visit: Payer: Self-pay

## 2024-06-03 MED ORDER — OXYCODONE-ACETAMINOPHEN 5-325 MG PO TABS
1.0000 | ORAL_TABLET | Freq: Two times a day (BID) | ORAL | 0 refills | Status: AC | PRN
  Filled 2024-06-03: qty 10, 5d supply, fill #0

## 2024-06-03 NOTE — Telephone Encounter (Signed)
 This was already sent at 8:10 this morning

## 2024-06-03 NOTE — Telephone Encounter (Signed)
 Pt had to cancel due to transportation cancelling but r/s for 10/29. Pt asked fro refill of pain medication. Please send to pharmacy on file. Pt phone number is [phone].

## 2024-06-06 ENCOUNTER — Other Ambulatory Visit: Payer: Self-pay

## 2024-06-06 ENCOUNTER — Telehealth: Payer: Self-pay

## 2024-06-06 ENCOUNTER — Ambulatory Visit: Attending: Family Medicine | Admitting: Physical Therapy

## 2024-06-06 NOTE — Telephone Encounter (Signed)
 Copied from CRM [id number]. Topic: Clinical - Medication Question >> Jun 06, 2024 10:34 AM Myrick T wrote: Reason for CRM: patient called stating at his last visit he requested a dose increase in the gabapentin  (NEURONTIN ) to 400mg  2x a day. Please f/u with patient.

## 2024-06-06 NOTE — Telephone Encounter (Signed)
 Patient has been called and informed of medication being increased at his last office visit.

## 2024-06-08 ENCOUNTER — Ambulatory Visit: Admitting: Physician Assistant

## 2024-06-08 NOTE — Progress Notes (Deleted)
 Office Visit Note   Patient: Taylor Hughes           Date of Birth: 03/13/1971           MRN: [id number] Visit Date: 06/08/2024              Requested by: Delbert Clam, MD [address] [place],  [place] [address] PCP: Delbert Clam, MD  No chief complaint on file.     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  His stump is well healed and he has a prosthesis.  On his last visit 05/10/24 he was following up with [facility] for modifications to his socket he expects delivery of his prosthesis on October 17. Wilkie Dallas, M.D was managing him for chronic pain prior to his amputation.   Assessment & Plan: Visit Diagnoses: No diagnosis found.  Plan: ***  Follow-Up Instructions: No follow-ups on file.   Ortho Exam  Patient is alert, oriented, no adenopathy, well-dressed, normal affect, normal respiratory effort. ***    Imaging: No results found. No images are attached to the encounter.  Labs: Lab Results  Component Value Date   HGBA1C 6.6 05/31/2024   HGBA1C 10.9 (H) 02/22/2024   HGBA1C 9.1 (H) 11/11/2023   ESRSEDRATE >140 (H) 03/22/2023   CRP 9.7 (H) 03/29/2023   CRP 15.7 (H) 03/28/2023   CRP 19.2 (H) 03/27/2023   REPTSTATUS 02/27/2024 FINAL 02/22/2024   REPTSTATUS 02/27/2024 FINAL 02/22/2024   GRAMSTAIN NO WBC SEEN RARE GRAM POSITIVE COCCI IN PAIRS  03/25/2023   CULT  02/22/2024    NO GROWTH 5 DAYS Performed at [facility], [address], [place], [place] [address]    CULT  02/22/2024    NO GROWTH 5 DAYS Performed at [facility], [address] [place], [place] [address]    LABORGA GROUP B STREP(S.AGALACTIAE)ISOLATED 02/21/2024     Lab Results  Component Value Date   ALBUMIN  1.7 (L) 02/22/2024   ALBUMIN  2.0 (L) 02/21/2024   ALBUMIN  1.8 (L) 11/12/2023    Lab Results  Component Value Date   MG 2.3 02/23/2024   MG 2.6 (H) 02/22/2024   MG 2.0 11/13/2023   No results  found for: VD25OH  No results found for: PREALBUMIN    Latest Ref Rng & Units 03/02/2024    8:52 AM 02/28/2024    7:40 AM 02/27/2024    4:00 AM  CBC EXTENDED  WBC 4.0 - 10.5 K/uL 8.2  7.1  10.4   RBC 4.22 - 5.81 MIL/uL 3.44  3.70  3.59   Hemoglobin 13.0 - 17.0 g/dL 9.3  89.9  9.6   HCT 60.9 - 52.0 % 30.1  33.0  30.8   Platelets 150 - 400 K/uL 347  366  344      There is no height or weight on file to calculate BMI.  Orders:  No orders of the defined types were placed in this encounter.  No orders of the defined types were placed in this encounter.    Procedures: No procedures performed  Clinical Data: No additional findings.  ROS:  All other systems negative, except as noted in the HPI. Review of Systems  Objective: Vital Signs: There were no vitals taken for this visit.  Specialty Comments:  No specialty comments available.  PMFS History: Patient Active Problem List   Diagnosis Date Noted   Phantom pain 05/31/2024   Bacteremia 04/14/2024  Medication management 04/14/2024   Streptococcus group B infection 04/14/2024   Cutaneous abscess of left foot 02/25/2024   Erosive esophagitis 02/25/2024   Duodenitis 02/25/2024   Iron deficiency anemia 02/24/2024   Coffee ground emesis 02/23/2024   Encephalopathy acute 02/22/2024   Non-pressure chronic ulcer of other part of left foot limited to breakdown of skin (HCC) 11/13/2023   Charcot joint of left foot 11/13/2023   Acute pyelonephritis 11/11/2023   DKA, type 2 (HCC) 11/10/2023   Flank pain 11/10/2023   Left thigh pain 11/10/2023   Neck pain 11/10/2023   Elevated troponin 11/10/2023   Lactic acidosis 11/10/2023   Acute cystitis with hematuria 06/25/2023   History of transmetatarsal amputation of right foot (HCC) 04/27/2023   Lymphedema 03/24/2023   Right ventricular enlargement 03/24/2023   Diabetic foot infection (HCC) 03/23/2023   Subacute osteomyelitis, left ankle and foot (HCC) 03/22/2023   Atrial  fibrillation, chronic (HCC) 03/22/2023   HLD (hyperlipidemia) 03/22/2023   Gastroparesis 03/22/2023   Depression 09/08/2022   GERD (gastroesophageal reflux disease) 03/03/2022   Diabetes mellitus (HCC) 09/19/2021   PAF (paroxysmal atrial fibrillation) (HCC)    Acute on chronic anemia    Venous insufficiency (chronic) (peripheral) 03/15/2019   Edema of both lower extremities due to peripheral venous insufficiency 03/15/2019   Essential hypertension 11/23/2018   AKI (acute kidney injury) 11/23/2018   Morbid obesity with BMI of 45.0-49.9, adult (HCC) 11/23/2018   Type 2 diabetes mellitus with other specified complication (HCC) 11/23/2018   Diabetic infection of left foot (HCC)    DKA (diabetic ketoacidosis) (HCC) 07/30/2018   DMII (diabetes mellitus, type 2) (HCC) 07/28/2018   Past Medical History:  Diagnosis Date   Atrial fibrillation (HCC)    Diabetes mellitus without complication (HCC)    Type 2   GERD (gastroesophageal reflux disease)    Hyperlipidemia    Hypertension    Morbid (severe) obesity due to excess calories (HCC) 08/07/2023   bmi 44.63   Wears glasses    Wound, open, foot    left diabetic     Family History  Problem Relation Age of Onset   Diabetes Mother    Colon cancer Brother    Esophageal cancer Neg Hx    Rectal cancer Neg Hx    Stomach cancer Neg Hx     Past Surgical History:  Procedure Laterality Date   AMPUTATION Left 08/20/2018   Procedure: LEFT FOOT IRRIGATON AND DEBRIDEMENT, 5TH RAY AMPUTATION;  Surgeon: Harden Jerona GAILS, MD;  [facility];  Service: Orthopedics;  Laterality: Left;   AMPUTATION Right 03/25/2023   Procedure: RIGHT TRANSMETATARSAL AMPUTATION;  Surgeon: Harden Jerona GAILS, MD;  Location: [facility] OR;  Service: Orthopedics;  Laterality: Right;   AMPUTATION Left 02/26/2024   Procedure: AMPUTATION BELOW KNEE, LEFT;  Surgeon: Harden Jerona GAILS, MD;  Location: [facility] OR;  Service: Orthopedics;  Laterality: Left;  WOUND VAC APPLIED   APPLICATION OF A-CELL OF  CHEST/ABDOMEN N/A 05/30/2019   Procedure: Application Of A-Cell Of Chest;  Surgeon: Shyrl Linnie KIDD, MD;  Location: [facility] OR;  Service: Cardiothoracic;  Laterality: N/A;   APPLICATION OF WOUND VAC N/A 05/30/2019   Procedure: Application Of Wound Vac;  Surgeon: Shyrl Linnie KIDD, MD;  Location: [facility] OR;  Service: Cardiothoracic;  Laterality: N/A;   APPLICATION OF WOUND VAC Left 02/26/2024   Procedure: APPLICATION, WOUND VAC;  Surgeon: Harden Jerona GAILS, MD;  [facility] OR;  Service: Orthopedics;  Laterality: Left;   ESOPHAGOGASTRODUODENOSCOPY N/A 02/25/2024  Procedure: EGD (ESOPHAGOGASTRODUODENOSCOPY);  Surgeon: Suzann Inocente HERO, MD;  Location: [facility] ENDOSCOPY;  Service: Gastroenterology;  Laterality: N/A;   FINGER SURGERY Right 2016   I&D  small finger   I & D EXTREMITY Left 07/30/2018   Procedure: IRRIGATION AND DEBRIDEMENT LEFT FOOT WITH POSSIBLE AMPUTATION OF FIFTH TOE;  Surgeon: Vernetta Lonni GRADE, MD;  Location: [facility] ORS;  Service: Orthopedics;  Laterality: Left;   INCISION AND DRAINAGE ABSCESS Left 01/19/2019   Procedure: INCISION AND DRAINAGE ABSCESS upper chest;  Surgeon: Carlie Clark, MD;  Location: [facility] OR;  Service: ENT;  Laterality: Left;   IRRIGATION AND DEBRIDEMENT STERNOCLAVICULAR JOINT-STERNUM AND RIBS N/A 05/30/2019   Procedure: IRRIGATION AND DEBRIDEMENT OF STERNOCLAVICULAR JOINT-STERNUM AND RIBS ;  Surgeon: Shyrl Linnie KIDD, MD;  Location: [facility] OR;  Service: Cardiothoracic;  Laterality: N/A;   MINOR IRRIGATION AND DEBRIDEMENT OF WOUND N/A 11/22/2018   Procedure: INCISION AND DRAINAGE OF NECK ABSCESS;  Surgeon: Carlie Clark, MD;  [facility]S;  Service: ENT;  Laterality: N/A;   Social History   Occupational History   Not on file  Tobacco Use   Smoking status: Never   Smokeless tobacco: Never  Vaping Use   Vaping status: Never Used  Substance and Sexual Activity   Alcohol use: No    Alcohol/week: 0.0 standard drinks of alcohol   Drug use: No   Sexual activity:  Not on file

## 2024-06-13 ENCOUNTER — Ambulatory Visit: Attending: Family Medicine | Admitting: Physical Therapy

## 2024-06-13 ENCOUNTER — Other Ambulatory Visit: Payer: Self-pay

## 2024-06-13 ENCOUNTER — Encounter: Payer: Self-pay | Admitting: Radiology

## 2024-06-13 ENCOUNTER — Other Ambulatory Visit: Payer: Self-pay | Admitting: Family Medicine

## 2024-06-13 ENCOUNTER — Other Ambulatory Visit: Payer: Self-pay | Admitting: Family

## 2024-06-13 DIAGNOSIS — S88112A Complete traumatic amputation at level between knee and ankle, left lower leg, initial encounter: Secondary | ICD-10-CM

## 2024-06-13 MED ORDER — FERROUS SULFATE 325 (65 FE) MG PO TABS
325.0000 mg | ORAL_TABLET | Freq: Every day | ORAL | 1 refills | Status: AC
  Filled 2024-06-13 – 2024-07-19 (×3): qty 90, 90d supply, fill #0

## 2024-06-14 ENCOUNTER — Other Ambulatory Visit: Payer: Self-pay

## 2024-06-15 ENCOUNTER — Telehealth: Payer: Self-pay

## 2024-06-15 NOTE — Telephone Encounter (Signed)
 PCS form has been started and will be faxed once provider signs.

## 2024-06-15 NOTE — Progress Notes (Deleted)
 Olon Russ [id number] Aug 05, 1971   Chief Complaint:  Referring Provider: Delbert Clam, MD Primary GI MD: Dr. Shila  HPI: Emersyn Kotarski is a 53 y.o. male with past medical history of A-fib, T2DM, GERD, HLD, HTN, morbid obesity, gastroparesis who presents today for a complaint of *** .    Hospital admission 02/21/2024 to 03/04/2024 for subacute osteomyelitis of left ankle and foot.  Presented with lethargy, metabolic encephalopathy secondary to DKA.  GI evaluated patient secondary to coffee-ground emesis in the setting of anemia.  On EGD 7/17 was found to have duodenitis, grade C erosive esophagitis thought to be likely source of coffee-ground emesis.  H. pylori was negative, path showed reactive gastropathy and metaplasia consistent with peptic duodenitis.  Advised to continue PPI twice daily until outpatient follow-up and endoscopy recall in 8 to 12 weeks. Gastric emptying scan 08/2022 showed delayed gastric emptying at 3 and 4 hours.  Records show patient previously took Reglan  on a scheduled basis and later changed to as needed.   Discussed the use of AI scribe software for clinical note transcription with the patient, who gave verbal consent to proceed.  History of Present Illness       Previous GI Procedures/Imaging   EGD 02/25/2024 - Normal upper third of esophagus.  - LA Grade C erosive esophagitis with no bleeding. This is a likely source of the patient' s previous coffee- ground emesis. This may also contribute to chronic anemia as patient may have chronic esophagitis related to gastroparesis.  - A small amount of food ( residue) in the stomach.  - Normal mucosa was found in the gastric body, in the antrum, in the cardia and in the gastric fundus. Biopsied.  - Duodenitis. Biopsied. Duodenitis may also be contributing to patient' s history of anemia.  - Normal duodenal bulb and second portion of the duodenum. - Recall 8-12 weeks Path: A. DUODENUM, BIOPSY:  Reactive  duodenal mucosa showing focal gastric metaplasia compatible  with peptic duodenitis   B. STOMACH, BIOPSY:  Reactive gastropathy showing focal activity  Negative for H. pylori, intestinal metaplasia, dysplasia and carcinoma   Colonoscopy 08/07/2023 - One 3 mm polyp in the sigmoid colon, removed with a cold snare. Resected and retrieved.  - Diverticulosis in the sigmoid colon.  - Non- bleeding external and internal hemorrhoids. - Recall 10 years Path: 1. Surgical [P], colon, sigmoid, polyp (1) :       - HYPERPLASTIC POLYP   Past Medical History:  Diagnosis Date   Atrial fibrillation (HCC)    Diabetes mellitus without complication (HCC)    Type 2   GERD (gastroesophageal reflux disease)    Hyperlipidemia    Hypertension    Morbid (severe) obesity due to excess calories (HCC) 08/07/2023   bmi 44.63   Wears glasses    Wound, open, foot    left diabetic     Past Surgical History:  Procedure Laterality Date   AMPUTATION Left 08/20/2018   Procedure: LEFT FOOT IRRIGATON AND DEBRIDEMENT, 5TH RAY AMPUTATION;  Surgeon: Harden Jerona GAILS, MD;  [facility];  Service: Orthopedics;  Laterality: Left;   AMPUTATION Right 03/25/2023   Procedure: RIGHT TRANSMETATARSAL AMPUTATION;  Surgeon: Harden Jerona GAILS, MD;  Location: [facility] OR;  Service: Orthopedics;  Laterality: Right;   AMPUTATION Left 02/26/2024   Procedure: AMPUTATION BELOW KNEE, LEFT;  Surgeon: Harden Jerona GAILS, MD;  Location: [facility] OR;  Service: Orthopedics;  Laterality: Left;  WOUND VAC APPLIED   APPLICATION OF A-CELL OF CHEST/ABDOMEN N/A  05/30/2019   Procedure: Application Of A-Cell Of Chest;  Surgeon: Shyrl Linnie KIDD, MD;  Location: [facility] OR;  Service: Cardiothoracic;  Laterality: N/A;   APPLICATION OF WOUND VAC N/A 05/30/2019   Procedure: Application Of Wound Vac;  Surgeon: Shyrl Linnie KIDD, MD;  Location: [facility] OR;  Service: Cardiothoracic;  Laterality: N/A;   APPLICATION OF WOUND VAC Left 02/26/2024   Procedure: APPLICATION, WOUND VAC;   Surgeon: Harden Jerona GAILS, MD;  [facility] OR;  Service: Orthopedics;  Laterality: Left;   ESOPHAGOGASTRODUODENOSCOPY N/A 02/25/2024   Procedure: EGD (ESOPHAGOGASTRODUODENOSCOPY);  Surgeon: Suzann Inocente HERO, MD;  Location: [facility] ENDOSCOPY;  Service: Gastroenterology;  Laterality: N/A;   FINGER SURGERY Right 2016   I&D  small finger   I & D EXTREMITY Left 07/30/2018   Procedure: IRRIGATION AND DEBRIDEMENT LEFT FOOT WITH POSSIBLE AMPUTATION OF FIFTH TOE;  Surgeon: Vernetta Lonni GRADE, MD;  [facility];  Service: Orthopedics;  Laterality: Left;   INCISION AND DRAINAGE ABSCESS Left 01/19/2019   Procedure: INCISION AND DRAINAGE ABSCESS upper chest;  Surgeon: Carlie Clark, MD;  Location: [facility] OR;  Service: ENT;  Laterality: Left;   IRRIGATION AND DEBRIDEMENT STERNOCLAVICULAR JOINT-STERNUM AND RIBS N/A 05/30/2019   Procedure: IRRIGATION AND DEBRIDEMENT OF STERNOCLAVICULAR JOINT-STERNUM AND RIBS ;  Surgeon: Shyrl Linnie KIDD, MD;  [facility] OR;  Service: Cardiothoracic;  Laterality: N/A;   MINOR IRRIGATION AND DEBRIDEMENT OF WOUND N/A 11/22/2018   Procedure: INCISION AND DRAINAGE OF NECK ABSCESS;  Surgeon: Carlie Clark, MD;  [facility];  Service: ENT;  Laterality: N/A;    Current Outpatient Medications  Medication Sig Dispense Refill   Accu-Chek Softclix Lancets lancets Use to check blood sugar 3 times daily. 100 each 6   acetaminophen  (TYLENOL ) 500 MG tablet Take 2 tablets (1,000 mg total) by mouth every 8 (eight) hours.     acidophilus (RISAQUAD) CAPS capsule Take 2 capsules by mouth 3 (three) times daily.     atorvastatin  (LIPITOR) 20 MG tablet Take 1 tablet (20 mg total) by mouth at bedtime. 90 tablet 1   carvedilol  (COREG ) 6.25 MG tablet Take 1 tablet (6.25 mg total) by mouth 2 (two) times daily with a meal. 180 tablet 1   Continuous Glucose Receiver (DEXCOM G7 RECEIVER) DEVI Use to test blood sugar 1 each 0   Continuous Glucose Sensor (DEXCOM G7 SENSOR) MISC Use to test blood  sugar 3 each 6   ferrous sulfate  (FEROSUL) 325 (65 FE) MG tablet Take 1 tablet (325 mg total) by mouth daily with breakfast. 90 tablet 1   gabapentin  (NEURONTIN ) 300 MG capsule Take 2 capsules (600 mg total) by mouth 3 (three) times daily. 540 capsule 1   glucose blood (ACCU-CHEK GUIDE TEST) test strip use as directed 3 (three) times daily. 100 strip 5   insulin  glargine (LANTUS  SOLOSTAR) 100 UNIT/ML Solostar Pen Inject 27 Units into the skin daily. 30 mL 1   insulin  lispro (HUMALOG  KWIKPEN) 100 UNIT/ML KwikPen Inject 0-12 units into the skin three times daily with meals. Per sliding scale. 12 mL 3   Multiple Vitamin (MULTIVITAMIN WITH MINERALS) TABS tablet Take 1 tablet by mouth daily.     oxyCODONE -acetaminophen  (PERCOCET/ROXICET) 5-325 MG tablet Take 1 tablet by mouth every 12 (twelve) hours as needed. 10 tablet 0   Semaglutide ,0.25 or 0.5MG /DOS, (OZEMPIC , 0.25 OR 0.5 MG/DOSE,) 2 MG/3ML SOPN Inject 0.25 mg into the skin once a week. For 4 weeks then increase to 0.5mg  3 mL 1   sildenafil  (VIAGRA ) 100  MG tablet Take 1 tablet (100 mg total) by mouth daily as needed for erectile dysfunction. At least 24 hours between doses 10 tablet 1   simethicone  (MYLICON) 80 MG chewable tablet Chew 1 tablet (80 mg total) by mouth 4 (four) times daily as needed for flatulence. 30 tablet 0   valsartan  (DIOVAN ) 160 MG tablet Take 1 tablet (160 mg total) by mouth daily. 90 tablet 1   No current facility-administered medications for this visit.    Allergies as of 06/16/2024   (No Known Allergies)    Family History  Problem Relation Age of Onset   Diabetes Mother    Colon cancer Brother    Esophageal cancer Neg Hx    Rectal cancer Neg Hx    Stomach cancer Neg Hx     Social History   Tobacco Use   Smoking status: Never   Smokeless tobacco: Never  Vaping Use   Vaping status: Never Used  Substance Use Topics   Alcohol use: No    Alcohol/week: 0.0 standard drinks of alcohol   Drug use: No     Review  of Systems:    Constitutional: No weight loss, fever, chills, weakness or fatigue Eyes: No change in vision Ears, Nose, Throat:  No change in hearing or congestion Skin: No rash or itching Cardiovascular: No chest pain, chest pressure or palpitations   Respiratory: No SOB or cough Gastrointestinal: See HPI and otherwise negative Genitourinary: No dysuria or change in urinary frequency Neurological: No headache, dizziness or syncope Musculoskeletal: No new muscle or joint pain Hematologic: No bleeding or bruising    Physical Exam:  Vital signs: There were no vitals taken for this visit.  Constitutional: NAD, Well developed, Well nourished, alert and cooperative Head:  Normocephalic and atraumatic.  Eyes: No scleral icterus. Conjunctiva pink. Mouth: No oral lesions. Respiratory: Respirations even and unlabored. Lungs clear to auscultation bilaterally.  No wheezes, crackles, or rhonchi.  Cardiovascular:  Regular rate and rhythm. No murmurs. No peripheral edema. Gastrointestinal:  Soft, nondistended, nontender. No rebound or guarding. Normal bowel sounds. No appreciable masses or hepatomegaly. Rectal:  Not performed.  Neurologic:  Alert and oriented x4;  grossly normal neurologically.  Skin:   Dry and intact without significant lesions or rashes. Psychiatric: Oriented to person, place and time. Demonstrates good judgement and reason without abnormal affect or behaviors.   RELEVANT LABS AND IMAGING: CBC    Component Value Date/Time   WBC 8.2 03/02/2024 0852   RBC 3.44 (L) 03/02/2024 0852   HGB 9.3 (L) 03/02/2024 0852   HGB 11.1 (L) 06/25/2023 1537   HCT 30.1 (L) 03/02/2024 0852   HCT 35.4 (L) 06/25/2023 1537   PLT 347 03/02/2024 0852   PLT 302 06/25/2023 1537   MCV 87.5 03/02/2024 0852   MCV 92 06/25/2023 1537   MCH 27.0 03/02/2024 0852   MCHC 30.9 03/02/2024 0852   RDW 18.0 (H) 03/02/2024 0852   RDW 14.5 06/25/2023 1537   LYMPHSABS 0.7 02/21/2024 2130   LYMPHSABS 1.3  06/25/2023 1537   MONOABS 0.3 02/21/2024 2130   EOSABS 0.0 02/21/2024 2130   EOSABS 0.1 06/25/2023 1537   BASOSABS 0.1 02/21/2024 2130   BASOSABS 0.0 06/25/2023 1537    CMP     Component Value Date/Time   NA 135 03/03/2024 0503   NA 139 10/10/2022 0917   K 4.4 03/03/2024 0503   CL 106 03/03/2024 0503   CO2 20 (L) 03/03/2024 0503   GLUCOSE 254 (H) 03/03/2024 0503  BUN 28 (H) 03/03/2024 0503   BUN 26 (H) 10/10/2022 0917   CREATININE 0.91 03/03/2024 0503   CALCIUM  8.0 (L) 03/03/2024 0503   PROT 7.5 02/22/2024 1030   PROT 7.3 10/10/2022 0917   ALBUMIN  1.7 (L) 02/22/2024 1030   ALBUMIN  4.0 (L) 10/10/2022 0917   AST 11 (L) 02/22/2024 1030   ALT 10 02/22/2024 1030   ALKPHOS 85 02/22/2024 1030   BILITOT 1.7 (H) 02/22/2024 1030   BILITOT 0.2 10/10/2022 0917   GFRNONAA >60 03/03/2024 0503   GFRAA >60 06/14/2019 1400   Echocardiogram 02/23/2024 1. Left ventricular ejection fraction, by estimation, is 55 to 60% . The left ventricle has normal function. The left ventricle has no regional wall motion abnormalities. Left ventricular diastolic parameters were normal.  2. Right ventricular systolic function is normal. The right ventricular size is normal. There is mildly elevated pulmonary artery systolic pressure. The estimated right ventricular systolic pressure is 39. 8 mmHg.  3. The mitral valve is normal in structure. No evidence of mitral valve regurgitation. No evidence of mitral stenosis.  4. The aortic valve is tricuspid. Aortic valve regurgitation is not visualized. No aortic stenosis is present.  5. The inferior vena cava is dilated in size with < 50% respiratory variability, suggesting right atrial pressure of 15 mmHg.  Assessment/Plan:    Assessment and Plan Assessment & Plan     Needs repeat EGD   Camie Furbish, PA-C [facility] 06/15/2024, 5:09 PM  Patient Care Team: Newlin, Enobong, MD as PCP - General (Family Medicine)

## 2024-06-16 ENCOUNTER — Ambulatory Visit: Admitting: Gastroenterology

## 2024-06-17 ENCOUNTER — Other Ambulatory Visit: Payer: Self-pay

## 2024-06-17 ENCOUNTER — Other Ambulatory Visit: Payer: Self-pay | Admitting: Family Medicine

## 2024-06-17 ENCOUNTER — Other Ambulatory Visit: Payer: Self-pay | Admitting: Family

## 2024-06-17 DIAGNOSIS — S88112A Complete traumatic amputation at level between knee and ankle, left lower leg, initial encounter: Secondary | ICD-10-CM

## 2024-06-17 DIAGNOSIS — N528 Other male erectile dysfunction: Secondary | ICD-10-CM

## 2024-06-17 MED ORDER — SILDENAFIL CITRATE 100 MG PO TABS
100.0000 mg | ORAL_TABLET | Freq: Every day | ORAL | 1 refills | Status: DC | PRN
  Filled 2024-06-17 – 2024-06-20 (×2): qty 10, 10d supply, fill #0
  Filled 2024-07-12 – 2024-07-19 (×2): qty 10, 10d supply, fill #1

## 2024-06-17 MED ORDER — DEXCOM G7 RECEIVER DEVI
0 refills | Status: DC
  Filled 2024-06-17: qty 1, 90d supply, fill #0

## 2024-06-20 ENCOUNTER — Other Ambulatory Visit: Payer: Self-pay

## 2024-06-21 ENCOUNTER — Ambulatory Visit: Admitting: Physical Therapy

## 2024-06-22 ENCOUNTER — Other Ambulatory Visit: Payer: Self-pay

## 2024-06-23 ENCOUNTER — Other Ambulatory Visit: Payer: Self-pay

## 2024-06-27 ENCOUNTER — Other Ambulatory Visit: Payer: Self-pay

## 2024-06-28 ENCOUNTER — Other Ambulatory Visit (HOSPITAL_COMMUNITY): Payer: Self-pay

## 2024-06-28 ENCOUNTER — Other Ambulatory Visit: Payer: Self-pay

## 2024-07-04 ENCOUNTER — Other Ambulatory Visit: Payer: Self-pay

## 2024-07-12 ENCOUNTER — Other Ambulatory Visit: Payer: Self-pay

## 2024-07-19 ENCOUNTER — Other Ambulatory Visit: Payer: Self-pay

## 2024-07-19 ENCOUNTER — Telehealth: Payer: Self-pay

## 2024-07-19 NOTE — Telephone Encounter (Signed)
 Copied from CRM #[id number]. Topic: General - Call Back - No Documentation >> Jul 19, 2024 12:29 PM Geneva B wrote: Reason for CRM: home [facility] is calling about paperwork that they faxed over on 07/09/2024 they have questions about the paperwork please call back [phone]

## 2024-07-20 NOTE — Telephone Encounter (Signed)
 Noted. Paperwork will be faxed once completed this week.

## 2024-07-21 ENCOUNTER — Other Ambulatory Visit: Payer: Self-pay | Admitting: Family Medicine

## 2024-07-21 NOTE — Telephone Encounter (Unsigned)
 Copied from CRM [id number]. Topic: Clinical - Medication Refill >> Jul 21, 2024 11:06 AM Charlet HERO wrote: Medication: Continuous Glucose Receiver (DEXCOM G7 RECEIVER) Dexcom G7 Test strips Has the patient contacted their pharmacy? Yes discontinued  This is the patient's preferred pharmacy:  [facility] [address] [place] [address] Phone: [phone] Fax: [phone]  Is this the correct pharmacy for this prescription? Yes If no, delete pharmacy and type the correct one.   Has the prescription been filled recently? Yes  Is the patient out of the medication? Yes  Has the patient been seen for an appointment in the last year OR does the patient have an upcoming appointment? Yes  Can we respond through MyChart? No  Agent: Please be advised that Rx refills may take up to 3 business days. We ask that you follow-up with your pharmacy.

## 2024-07-22 ENCOUNTER — Other Ambulatory Visit: Payer: Self-pay

## 2024-07-22 ENCOUNTER — Encounter: Payer: Self-pay | Admitting: Family Medicine

## 2024-07-22 ENCOUNTER — Other Ambulatory Visit: Payer: Self-pay | Admitting: Family Medicine

## 2024-07-22 MED ORDER — DEXCOM G7 RECEIVER DEVI
0 refills | Status: AC
  Filled 2024-07-22: qty 1, 30d supply, fill #0

## 2024-07-22 NOTE — Telephone Encounter (Unsigned)
 Copied from CRM #[id number]. Topic: Clinical - Medication Refill >> Jul 22, 2024 12:21 PM Dedra B wrote: Medication: glucose blood (ACCU-CHEK GUIDE TEST) test strip  Has the patient contacted their pharmacy? Pt said he doesn't have any refills  This is the patient's preferred pharmacy:  [facility] [address] [place] [address] Phone: [phone] Fax: [phone]  Is this the correct pharmacy for this prescription? Yes  Has the prescription been filled recently? No  Is the patient out of the medication? Yes  Has the patient been seen for an appointment in the last year OR does the patient have an upcoming appointment? Yes  Can we respond through MyChart? Yes  Agent: Please be advised that Rx refills may take up to 3 business days. We ask that you follow-up with your pharmacy.

## 2024-07-22 NOTE — Telephone Encounter (Signed)
 Requested Prescriptions  Pending Prescriptions Disp Refills   Continuous Glucose Receiver (DEXCOM G7 RECEIVER) DEVI 1 each 0    Sig: Use to test blood sugar     Endocrinology: Diabetes - Testing Supplies Passed - 07/22/2024  4:21 PM      Passed - Valid encounter within last 12 months    Recent Outpatient Visits           1 month ago Type 2 diabetes mellitus with diabetic polyneuropathy, with long-term current use of insulin  (HCC)   [facility] [place] Delbert Clam, MD   2 months ago Type 2 diabetes mellitus with diabetic polyneuropathy, with long-term current use of insulin  [facility])   [facility] [place]. [facility] Delbert Clam, MD   7 months ago Type 2 diabetes mellitus with hyperglycemia, unspecified whether long term insulin  use [facility])   [facility] [place]. [facility] [place] [place], [place]   1 year ago Acute cystitis with hematuria   [facility] [place]. [facility] Belvie BRAVO, MD   1 year ago Hypertension associated with diabetes [facility])    [facility] [place]. [facility] Delbert Clam, MD

## 2024-07-25 ENCOUNTER — Other Ambulatory Visit: Payer: Self-pay

## 2024-07-25 MED ORDER — ACCU-CHEK GUIDE TEST VI STRP
1.0000 | ORAL_STRIP | Freq: Three times a day (TID) | 5 refills | Status: AC
  Filled 2024-07-25 – 2024-08-16 (×2): qty 100, 34d supply, fill #0

## 2024-07-25 NOTE — Telephone Encounter (Signed)
 Requested medication (s) are due for refill today: yes  Requested medication (s) are on the active medication list: yes  Last refill:  n/a  Future visit scheduled: no  Notes to clinic:  routing for review     Requested Prescriptions  Pending Prescriptions Disp Refills   glucose blood (ACCU-CHEK GUIDE TEST) test strip 100 strip 5    Sig: use as directed 3 (three) times daily.     There is no refill protocol information for this order

## 2024-07-26 ENCOUNTER — Other Ambulatory Visit: Payer: Self-pay

## 2024-07-27 ENCOUNTER — Other Ambulatory Visit: Payer: Self-pay

## 2024-07-28 ENCOUNTER — Other Ambulatory Visit: Payer: Self-pay

## 2024-07-29 ENCOUNTER — Other Ambulatory Visit: Payer: Self-pay

## 2024-08-12 ENCOUNTER — Other Ambulatory Visit: Payer: Self-pay | Admitting: Family Medicine

## 2024-08-12 ENCOUNTER — Other Ambulatory Visit (HOSPITAL_COMMUNITY): Payer: Self-pay

## 2024-08-12 ENCOUNTER — Other Ambulatory Visit: Payer: Self-pay

## 2024-08-12 DIAGNOSIS — N528 Other male erectile dysfunction: Secondary | ICD-10-CM

## 2024-08-12 MED ORDER — SILDENAFIL CITRATE 100 MG PO TABS
100.0000 mg | ORAL_TABLET | Freq: Every day | ORAL | 1 refills | Status: DC | PRN
  Filled 2024-08-12: qty 10, 10d supply, fill #0
  Filled 2024-08-24: qty 10, 10d supply, fill #1

## 2024-08-16 ENCOUNTER — Other Ambulatory Visit: Payer: Self-pay

## 2024-08-24 ENCOUNTER — Other Ambulatory Visit: Payer: Self-pay

## 2024-08-31 ENCOUNTER — Telehealth: Payer: Self-pay | Admitting: Family Medicine

## 2024-08-31 NOTE — Telephone Encounter (Signed)
 Contacted pt left vm to confirmed appt (per vr)

## 2024-09-01 ENCOUNTER — Ambulatory Visit: Admitting: Family Medicine

## 2024-09-02 ENCOUNTER — Other Ambulatory Visit: Payer: Self-pay

## 2024-09-02 ENCOUNTER — Other Ambulatory Visit: Payer: Self-pay | Admitting: Family Medicine

## 2024-09-02 DIAGNOSIS — N528 Other male erectile dysfunction: Secondary | ICD-10-CM

## 2024-09-03 MED ORDER — SILDENAFIL CITRATE 100 MG PO TABS
100.0000 mg | ORAL_TABLET | Freq: Every day | ORAL | 1 refills | Status: AC | PRN
  Filled 2024-09-03: qty 10, 10d supply, fill #0

## 2024-09-05 ENCOUNTER — Telehealth: Payer: Self-pay | Admitting: Family Medicine

## 2024-09-05 ENCOUNTER — Other Ambulatory Visit: Payer: Self-pay

## 2024-09-05 ENCOUNTER — Ambulatory Visit: Payer: Self-pay

## 2024-09-05 ENCOUNTER — Ambulatory Visit: Admitting: Family Medicine

## 2024-09-05 NOTE — Telephone Encounter (Signed)
 Contacted pt left vm  2nd attempt

## 2024-09-05 NOTE — Telephone Encounter (Signed)
 Call placed to patient to offer virtual appointment today, patient declined scheduled for 09/07/24

## 2024-09-05 NOTE — Telephone Encounter (Signed)
 Contacted patient and left voicemail requesting to change todays appointment to virtual or to reschedule. No in-person appointments today due to weather. If patient calls back, please switch appointment accordingly.

## 2024-09-05 NOTE — Telephone Encounter (Signed)
 FYI Only or Action Required?: FYI only for provider: appointment scheduled on 09/14/24.  Patient was last seen in primary care on 05/31/2024 by Newlin, Enobong, MD.  Called Nurse Triage reporting Facial Swelling and Facial Pain.  Symptoms began several months ago.  Interventions attempted: Nothing.  Symptoms are: gradually worsening.  Triage Disposition: See PCP Within 2 Weeks (overriding See HCP Within 4 Hours (Or PCP Triage))  Patient/caregiver understands and will follow disposition?: Yes  Summary: Shocking Pain right side of face   Reason for Triage: Patient is calling to report facial shocking pain that is unbearable on under right eye on check bone since June of 2025. Can move mouth a certain way and then receive an unbearable pain.         Reason for Disposition  [1] Swollen area of face AND [2] is painful to touch  Answer Assessment - Initial Assessment Questions Patient states that he has been experiencing this intermittent facial pain since June of 2025. More recently he reports noting puffiness to the area as well. Denies any other symptoms. Office visit advised, but no available appts until next week. Advised to go to [facility] or ED if swelling worsens.   1. ONSET: When did the pain start? (e.g., minutes, hours, days)     June 2025  2. ONSET: Does the pain come and go, or has it been constant since it started? (e.g., constant, intermittent, fleeting)     Intermittent, painful to touch  3. SEVERITY: How bad is the pain? (Scale 1-10; mild, moderate or severe)     10/10 when present  4. LOCATION: Where does it hurt?      Right side of face, on cheek bone and right under eye  5. RASH: Is there any redness, rash, or swelling of your face?     Reports mild swelling  6. FEVER: Do you have a fever? If Yes, ask: What is it, how was it measured, and when did it start?      No  7. OTHER SYMPTOMS: Do you have any other symptoms? (e.g., fever, toothache, nasal  discharge, nasal congestion, clicking sensation in jaw joint)     Denies any other symptoms  8. PREGNANCY: Is there any chance you are pregnant? When was your last menstrual period?     NA  Protocols used: Face Pain-A-AH

## 2024-09-06 ENCOUNTER — Telehealth: Payer: Self-pay | Admitting: Family Medicine

## 2024-09-06 ENCOUNTER — Other Ambulatory Visit: Payer: Self-pay

## 2024-09-06 NOTE — Telephone Encounter (Signed)
 Pt confirmed appt

## 2024-09-07 ENCOUNTER — Ambulatory Visit: Admitting: Family Medicine

## 2024-09-07 ENCOUNTER — Encounter: Payer: Self-pay | Admitting: Family Medicine

## 2024-09-07 ENCOUNTER — Other Ambulatory Visit: Payer: Self-pay

## 2024-09-07 ENCOUNTER — Ambulatory Visit: Payer: Self-pay | Admitting: Family Medicine

## 2024-09-07 DIAGNOSIS — R52 Pain, unspecified: Secondary | ICD-10-CM

## 2024-09-07 DIAGNOSIS — E1159 Type 2 diabetes mellitus with other circulatory complications: Secondary | ICD-10-CM

## 2024-09-07 DIAGNOSIS — I10 Essential (primary) hypertension: Secondary | ICD-10-CM

## 2024-09-07 DIAGNOSIS — R202 Paresthesia of skin: Secondary | ICD-10-CM | POA: Diagnosis not present

## 2024-09-07 DIAGNOSIS — G546 Phantom limb syndrome with pain: Secondary | ICD-10-CM | POA: Diagnosis not present

## 2024-09-07 MED ORDER — DULOXETINE HCL 60 MG PO CPEP
60.0000 mg | ORAL_CAPSULE | Freq: Every day | ORAL | 3 refills | Status: AC
  Filled 2024-09-07: qty 30, 30d supply, fill #0

## 2024-09-07 NOTE — Patient Instructions (Signed)
 VISIT SUMMARY:  During your visit, we discussed your facial swelling and abnormal sensations following dental work, as well as your ongoing management of type 2 diabetes, hypertension, and phantom limb pain.  YOUR PLAN:  -FACIAL PARESTHESIA FOLLOWING DENTAL PROCEDURE: Facial paresthesia refers to abnormal sensations, such as tingling or shocking feelings, on the face. This may be due to nerve injury or neuropathic pain following your dental procedure. We have prescribed duloxetine  to help manage the pain and numbness. An MRI of your brain has been ordered to check for any nerve abnormalities. Please contact your oral surgeon to discuss these symptoms. If the MRI shows any abnormalities, we will refer you to a neurologist.  -TYPE 2 DIABETES MELLITUS WITH DIABETIC POLYNEUROPATHY: Type 2 diabetes is a condition where your body does not use insulin  properly, leading to high blood sugar levels. Diabetic polyneuropathy is nerve damage caused by diabetes. We need to follow up on your diabetes management and check your A1c levels to assess your blood sugar control and any complications.  -HYPERTENSION ASSOCIATED WITH TYPE 2 DIABETES MELLITUS: Hypertension, or high blood pressure, is often associated with diabetes and can increase the risk of complications. Your previous blood pressure reading was elevated, so we need to follow up to assess and manage your blood pressure.  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 We have prescribed duloxetine  to help manage this pain.  INSTRUCTIONS:  Please schedule an in-person visit to follow up on your diabetes management and check your A1c levels. Additionally, schedule a visit to follow up on your blood pressure management. Contact your oral surgeon to discuss your facial symptoms. An MRI of your brain has been ordered; please complete this as soon as possible. If the MRI shows any  abnormalities, we will refer you to a neurologist.

## 2024-09-07 NOTE — Progress Notes (Signed)
 "  Virtual Visit via Video Note  I connected with Taylor Hughes, on 09/07/2024 at 12:14 PM by video enabled telemedicine device and verified that I am speaking with the correct person using two identifiers.   Consent: I discussed the limitations, risks, security and privacy concerns of performing an evaluation and management service by telemedicine and the availability of in person appointments. I also discussed with the patient that there may be a patient responsible charge related to this service. The patient expressed understanding and agreed to proceed.   Clinician has audio - video capabilities but patient was only able to operate/preferred audio.  Location of Patient: Home  Location of Provider: Clinic   Persons participating in Telemedicine visit: Taylor Hughes Dr. Delbert    Discussed the use of AI scribe software for clinical note transcription with the patient, who gave verbal consent to proceed.  History of Present Illness Taylor Hughes is a 54 year old male with a history of Type 2 diabetes mellitus, L fifth toe ray amputation, hypertension, hyperlipidemia, Lymphedema, left BKA (in 02/2024) diabetes and hypertension who presents with facial swelling and abnormal sensations following dental work.  He has right facial puffiness with a shocking sensation near the right nostril triggered by loud talking or certain mouth movements. Symptoms began after dental work with a nerve block.  At his last visit 3 months ago, he had stated symptoms began 6 to 8 months prior.  He takes gabapentin  for neuropathy and also for phantom pain in left extremity. He avoids direct water  on his face due to discomfort and instead bends his head down in the shower to let water  run over him.  He never spoke to his oral surgeon about the symptoms despite being persuaded to do so at his last visit.      Past Medical History:  Diagnosis Date   Atrial fibrillation (HCC)    Diabetes mellitus without  complication (HCC)    Type 2   GERD (gastroesophageal reflux disease)    Hyperlipidemia    Hypertension    Morbid (severe) obesity due to excess calories (HCC) 08/07/2023   bmi 44.63   Wears glasses    Wound, open, foot    left diabetic    Allergies[1]  Medications Ordered Prior to Encounter[2]  ROS: See HPI  Observations/Objective: Awake, alert, oriented x3 Not in acute distress Normal mood      Latest Ref Rng & Units 03/03/2024    5:03 AM 03/02/2024    8:52 AM 02/29/2024    6:50 AM  CMP  Glucose 70 - 99 mg/dL 745  665  750   BUN 6 - 20 mg/dL 28  28  26    Creatinine 0.61 - 1.24 mg/dL 9.08  9.03  9.08   Sodium 135 - 145 mmol/L 135  133  137   Potassium 3.5 - 5.1 mmol/L 4.4  4.3  3.6   Chloride 98 - 111 mmol/L 106  106  105   CO2 22 - 32 mmol/L 20  17  22    Calcium  8.9 - 10.3 mg/dL 8.0  8.0  8.1     Lipid Panel     Component Value Date/Time   CHOL 153 11/11/2023 0319   CHOL 173 05/09/2022 1359   TRIG 61 11/11/2023 0319   HDL 60 11/11/2023 0319   HDL 71 05/09/2022 1359   CHOLHDL 2.6 11/11/2023 0319   VLDL 12 11/11/2023 0319   LDLCALC 81 11/11/2023 0319   LDLCALC 92 05/09/2022 1359  LABVLDL 10 05/09/2022 1359    Lab Results  Component Value Date   HGBA1C 6.6 05/31/2024     Assessment and plan:  Assessment & Plan Facial paresthesia  Intermittent shocking sensation on the right side of the face post-dental procedure possible nerve block or injury.  His history regarding time and has been inconsistent but from my documentation in the chart this is close to 1 year.  Differential includes nerve injury or neuropathic pain. - Prescribed duloxetine  for neuropathic pain and numbness. - Ordered MRI of the brain to assess for nerve abnormalities. - Advised contacting the oral surgeon to discuss symptoms. - Will refer to neurologist if MRI shows abnormalities.   Hypertension associated with type 2 diabetes mellitus Previous blood pressure reading was elevated  at 184/94. Requires follow-up to assess control. - Schedule in-person visit to follow up on blood pressure management.  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 - Prescribed duloxetine  for phantom limb pain.      Meds ordered this encounter  Medications   DULoxetine  (CYMBALTA ) 60 MG capsule    Sig: Take 1 capsule (60 mg total) by mouth daily. For neuropathy and paresthesia of face    Dispense:  30 capsule    Refill:  3    Follow Up Instructions: Patient advised to schedule an appointment for chronic disease management   I discussed the assessment and treatment plan with the patient. The patient was provided an opportunity to ask questions and all were answered. The patient agreed with the plan and demonstrated an understanding of the instructions.   The patient was advised to call back or seek an in-person evaluation if the symptoms worsen or if the condition fails to improve as anticipated.     I provided 13 minutes total of Telehealth time during this encounter including median intraservice time, reviewing previous notes, investigations, ordering medications, medical decision making, coordinating care and patient verbalized understanding at the end of the visit.     Corrina Sabin, MD, FAAFP. [facility] [place], [place] [phone]   09/07/2024, 12:14 PM     [1] No Known Allergies [2]  Current Outpatient Medications on File Prior to Visit  Medication Sig Dispense Refill   Accu-Chek Softclix Lancets lancets Use to check blood sugar 3 times daily. 100 each 6   acetaminophen  (TYLENOL ) 500 MG tablet Take 2 tablets (1,000 mg total) by mouth every 8 (eight) hours.     acidophilus (RISAQUAD) CAPS capsule Take 2 capsules by mouth 3 (three) times daily.     atorvastatin  (LIPITOR) 20 MG tablet Take 1 tablet (20 mg total) by mouth at bedtime. 90 tablet 1   carvedilol   (COREG ) 6.25 MG tablet Take 1 tablet (6.25 mg total) by mouth 2 (two) times daily with a meal. 180 tablet 1   Continuous Glucose Receiver (DEXCOM G7 RECEIVER) DEVI Use to test blood sugar 1 each 0   Continuous Glucose Sensor (DEXCOM G7 SENSOR) MISC Use to test blood sugar 3 each 6   ferrous sulfate  (FEROSUL) 325 (65 FE) MG tablet Take 1 tablet (325 mg total) by mouth daily with breakfast. 90 tablet 1   gabapentin  (NEURONTIN ) 300 MG capsule Take 2 capsules (600 mg total) by mouth 3 (three) times daily. 540 capsule 1   glucose blood (ACCU-CHEK GUIDE TEST) test strip Use as directed 3 (three) times daily to test blood glucose. 100 strip 5   insulin  glargine (LANTUS  SOLOSTAR)  100 UNIT/ML Solostar Pen Inject 27 Units into the skin daily. 30 mL 1   insulin  lispro (HUMALOG  KWIKPEN) 100 UNIT/ML KwikPen Inject 0-12 units into the skin three times daily with meals. Per sliding scale. 12 mL 3   Multiple Vitamin (MULTIVITAMIN WITH MINERALS) TABS tablet Take 1 tablet by mouth daily.     oxyCODONE -acetaminophen  (PERCOCET/ROXICET) 5-325 MG tablet Take 1 tablet by mouth every 12 (twelve) hours as needed. 10 tablet 0   Semaglutide ,0.25 or 0.5MG /DOS, (OZEMPIC , 0.25 OR 0.5 MG/DOSE,) 2 MG/3ML SOPN Inject 0.25 mg into the skin once a week. For 4 weeks then increase to 0.5mg  3 mL 1   sildenafil  (VIAGRA ) 100 MG tablet Take 1 tablet (100 mg total) by mouth daily as needed for erectile dysfunction. At least 24 hours between doses 10 tablet 1   simethicone  (MYLICON) 80 MG chewable tablet Chew 1 tablet (80 mg total) by mouth 4 (four) times daily as needed for flatulence. 30 tablet 0   valsartan  (DIOVAN ) 160 MG tablet Take 1 tablet (160 mg total) by mouth daily. 90 tablet 1   [DISCONTINUED] furosemide  (LASIX ) 20 MG tablet Take 2 tablets (40 mg total) by mouth daily. (Patient not taking: Reported on 08/07/2023) 180 tablet 1   [DISCONTINUED] pantoprazole  (PROTONIX ) 40 MG tablet Take 1 tablet (40 mg total) by mouth daily. 90 tablet  0   No current facility-administered medications on file prior to visit.   "

## 2024-09-14 ENCOUNTER — Ambulatory Visit: Payer: Self-pay | Admitting: *Deleted

## 2024-10-19 ENCOUNTER — Ambulatory Visit: Payer: Self-pay | Admitting: Family Medicine

## 2024-10-24 ENCOUNTER — Ambulatory Visit: Payer: Self-pay | Admitting: Family Medicine
